# Patient Record
Sex: Female | Born: 1970 | Race: Black or African American | Hispanic: No | Marital: Single | Smoking: Former smoker
Health system: Southern US, Community
[De-identification: ages and names within clinical notes are randomized; demographics above are authoritative.]

## PROBLEM LIST (undated history)

## (undated) DIAGNOSIS — T7840XA Allergy, unspecified, initial encounter: Secondary | ICD-10-CM

## (undated) DIAGNOSIS — I82409 Acute embolism and thrombosis of unspecified deep veins of unspecified lower extremity: Secondary | ICD-10-CM

## (undated) DIAGNOSIS — I2510 Atherosclerotic heart disease of native coronary artery without angina pectoris: Secondary | ICD-10-CM

## (undated) DIAGNOSIS — D500 Iron deficiency anemia secondary to blood loss (chronic): Secondary | ICD-10-CM

## (undated) DIAGNOSIS — M1712 Unilateral primary osteoarthritis, left knee: Secondary | ICD-10-CM

## (undated) DIAGNOSIS — G4730 Sleep apnea, unspecified: Secondary | ICD-10-CM

## (undated) DIAGNOSIS — I10 Essential (primary) hypertension: Secondary | ICD-10-CM

## (undated) DIAGNOSIS — I509 Heart failure, unspecified: Secondary | ICD-10-CM

## (undated) DIAGNOSIS — D649 Anemia, unspecified: Secondary | ICD-10-CM

## (undated) DIAGNOSIS — K56609 Unspecified intestinal obstruction, unspecified as to partial versus complete obstruction: Secondary | ICD-10-CM

## (undated) DIAGNOSIS — D689 Coagulation defect, unspecified: Secondary | ICD-10-CM

## (undated) DIAGNOSIS — R479 Unspecified speech disturbances: Secondary | ICD-10-CM

## (undated) DIAGNOSIS — I639 Cerebral infarction, unspecified: Secondary | ICD-10-CM

## (undated) DIAGNOSIS — R7303 Prediabetes: Secondary | ICD-10-CM

## (undated) DIAGNOSIS — Z8719 Personal history of other diseases of the digestive system: Secondary | ICD-10-CM

## (undated) DIAGNOSIS — E119 Type 2 diabetes mellitus without complications: Secondary | ICD-10-CM

## (undated) DIAGNOSIS — F411 Generalized anxiety disorder: Secondary | ICD-10-CM

## (undated) DIAGNOSIS — L0390 Cellulitis, unspecified: Secondary | ICD-10-CM

## (undated) DIAGNOSIS — M1990 Unspecified osteoarthritis, unspecified site: Secondary | ICD-10-CM

## (undated) DIAGNOSIS — J449 Chronic obstructive pulmonary disease, unspecified: Secondary | ICD-10-CM

## (undated) HISTORY — DX: Prediabetes: R73.03

## (undated) HISTORY — DX: Chronic obstructive pulmonary disease, unspecified: J44.9

## (undated) HISTORY — DX: Generalized anxiety disorder: F41.1

## (undated) HISTORY — DX: Iron deficiency anemia secondary to blood loss (chronic): D50.0

## (undated) HISTORY — DX: Allergy, unspecified, initial encounter: T78.40XA

## (undated) HISTORY — DX: Morbid (severe) obesity due to excess calories: E66.01

## (undated) HISTORY — DX: Atherosclerotic heart disease of native coronary artery without angina pectoris: I25.10

## (undated) HISTORY — PX: EYE SURGERY: SHX253

## (undated) HISTORY — DX: Personal history of other diseases of the digestive system: Z87.19

## (undated) HISTORY — DX: Unspecified osteoarthritis, unspecified site: M19.90

## (undated) HISTORY — DX: Coagulation defect, unspecified: D68.9

## (undated) HISTORY — DX: Unspecified speech disturbances: R47.9

## (undated) HISTORY — DX: Cerebral infarction, unspecified: I63.9

## (undated) HISTORY — DX: Unilateral primary osteoarthritis, left knee: M17.12

## (undated) HISTORY — DX: Unspecified intestinal obstruction, unspecified as to partial versus complete obstruction: K56.609

## (undated) HISTORY — DX: Sleep apnea, unspecified: G47.30

---
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
## 1994-04-29 HISTORY — PX: OOPHORECTOMY: SHX86

## 1995-04-30 HISTORY — PX: OOPHORECTOMY: SHX86

## 2012-04-29 HISTORY — PX: PORTACATH PLACEMENT: SHX2246

## 2015-04-30 HISTORY — PX: IVC FILTER INSERTION: CATH118245

## 2017-09-17 DIAGNOSIS — I82409 Acute embolism and thrombosis of unspecified deep veins of unspecified lower extremity: Secondary | ICD-10-CM

## 2017-09-17 HISTORY — DX: Acute embolism and thrombosis of unspecified deep veins of unspecified lower extremity: I82.409

## 2018-11-16 DIAGNOSIS — Z8711 Personal history of peptic ulcer disease: Secondary | ICD-10-CM

## 2018-11-16 HISTORY — DX: Personal history of peptic ulcer disease: Z87.11

## 2019-11-17 DIAGNOSIS — I2510 Atherosclerotic heart disease of native coronary artery without angina pectoris: Secondary | ICD-10-CM | POA: Insufficient documentation

## 2020-01-20 ENCOUNTER — Encounter (HOSPITAL_COMMUNITY): Payer: Self-pay | Admitting: Emergency Medicine

## 2020-01-20 ENCOUNTER — Emergency Department (HOSPITAL_COMMUNITY)
Admission: EM | Admit: 2020-01-20 | Discharge: 2020-01-21 | Disposition: A | Discharge status: Home / Self Care | Payer: Medicaid - Out of State | Attending: Emergency Medicine | Admitting: Emergency Medicine

## 2020-01-20 ENCOUNTER — Other Ambulatory Visit: Payer: Self-pay

## 2020-01-20 DIAGNOSIS — I509 Heart failure, unspecified: Secondary | ICD-10-CM | POA: Insufficient documentation

## 2020-01-20 DIAGNOSIS — I2510 Atherosclerotic heart disease of native coronary artery without angina pectoris: Secondary | ICD-10-CM | POA: Insufficient documentation

## 2020-01-20 DIAGNOSIS — Z7901 Long term (current) use of anticoagulants: Secondary | ICD-10-CM | POA: Insufficient documentation

## 2020-01-20 DIAGNOSIS — L97929 Non-pressure chronic ulcer of unspecified part of left lower leg with unspecified severity: Secondary | ICD-10-CM | POA: Insufficient documentation

## 2020-01-20 DIAGNOSIS — I87312 Chronic venous hypertension (idiopathic) with ulcer of left lower extremity: Secondary | ICD-10-CM | POA: Insufficient documentation

## 2020-01-20 DIAGNOSIS — Z79899 Other long term (current) drug therapy: Secondary | ICD-10-CM | POA: Diagnosis not present

## 2020-01-20 DIAGNOSIS — I110 Hypertensive heart disease with heart failure: Secondary | ICD-10-CM | POA: Insufficient documentation

## 2020-01-20 DIAGNOSIS — L97822 Non-pressure chronic ulcer of other part of left lower leg with fat layer exposed: Secondary | ICD-10-CM

## 2020-01-20 DIAGNOSIS — L03116 Cellulitis of left lower limb: Secondary | ICD-10-CM | POA: Diagnosis present

## 2020-01-20 HISTORY — DX: Cellulitis, unspecified: L03.90

## 2020-01-20 HISTORY — DX: Atherosclerotic heart disease of native coronary artery without angina pectoris: I25.10

## 2020-01-20 HISTORY — DX: Heart failure, unspecified: I50.9

## 2020-01-20 HISTORY — DX: Essential (primary) hypertension: I10

## 2020-01-20 LAB — COMPREHENSIVE METABOLIC PANEL
ALT: 11 U/L (ref 0–44)
AST: 16 U/L (ref 15–41)
Albumin: 3.3 g/dL — ABNORMAL LOW (ref 3.5–5.0)
Alkaline Phosphatase: 74 U/L (ref 38–126)
Anion gap: 9 (ref 5–15)
BUN: 9 mg/dL (ref 6–20)
CO2: 28 mmol/L (ref 22–32)
Calcium: 9.3 mg/dL (ref 8.9–10.3)
Chloride: 103 mmol/L (ref 98–111)
Creatinine, Ser: 0.75 mg/dL (ref 0.44–1.00)
GFR calc Af Amer: >60 mL/min (ref >60)
GFR calc non Af Amer: >60 mL/min (ref >60)
Glucose, Bld: 98 mg/dL (ref 70–99)
Potassium: 3.6 mmol/L (ref 3.5–5.1)
Sodium: 140 mmol/L (ref 135–145)
Total Bilirubin: 1.3 mg/dL — ABNORMAL HIGH (ref 0.3–1.2)
Total Protein: 7.5 g/dL (ref 6.5–8.1)

## 2020-01-20 LAB — CBG MONITORING, ED: Glucose-Capillary: 84 mg/dL (ref 70–99)

## 2020-01-20 LAB — CBC WITH DIFFERENTIAL/PLATELET
Abs Immature Granulocytes: 0.02 10*3/uL (ref 0.00–0.07)
Basophils Absolute: 0 10*3/uL (ref 0.0–0.1)
Basophils Relative: 0 %
Eosinophils Absolute: 0.1 10*3/uL (ref 0.0–0.5)
Eosinophils Relative: 1 %
HCT: 35.2 % — ABNORMAL LOW (ref 36.0–46.0)
Hemoglobin: 10.1 g/dL — ABNORMAL LOW (ref 12.0–15.0)
Immature Granulocytes: 0 %
Lymphocytes Relative: 9 %
Lymphs Abs: 0.5 10*3/uL — ABNORMAL LOW (ref 0.7–4.0)
MCH: 26.2 pg (ref 26.0–34.0)
MCHC: 28.7 g/dL — ABNORMAL LOW (ref 30.0–36.0)
MCV: 91.4 fL (ref 80.0–100.0)
Monocytes Absolute: 0.4 10*3/uL (ref 0.1–1.0)
Monocytes Relative: 7 %
Neutro Abs: 4.7 10*3/uL (ref 1.7–7.7)
Neutrophils Relative %: 83 %
Platelets: 151 10*3/uL (ref 150–400)
RBC: 3.85 MIL/uL — ABNORMAL LOW (ref 3.87–5.11)
RDW: 15.7 % — ABNORMAL HIGH (ref 11.5–15.5)
WBC: 5.8 10*3/uL (ref 4.0–10.5)
nRBC: 0 % (ref 0.0–0.2)

## 2020-01-20 LAB — I-STAT BETA HCG BLOOD, ED (MC, WL, AP ONLY): I-stat hCG, quantitative: <5 m[IU]/mL (ref <5)

## 2020-01-20 LAB — PROTIME-INR
INR: 1 (ref 0.8–1.2)
Prothrombin Time: 12.9 seconds (ref 11.4–15.2)

## 2020-01-20 LAB — LACTIC ACID, PLASMA: Lactic Acid, Venous: 1 mmol/L (ref 0.5–1.9)

## 2020-01-20 NOTE — ED Notes (Signed)
Pt asked that her leg bandage be cleaned because it is wet. Non-adherent and stretch gauze used.

## 2020-01-20 NOTE — ED Triage Notes (Signed)
Pt reports cellulitis to L lower leg that has been worsening over the past 2 weeks.  Reports history of same and states she had been doing wound care at home with no improvement.    Denies fever and chills.

## 2020-01-20 NOTE — ED Notes (Signed)
Pt stated that she felt her blood sugar was low. Pt's CBG result checked. Pt's CBG result was 84. Informed Psychologist, sport and exercise. Pt asked for graham crackers. Pt given a pack of graham crackers, per [employer] manager.

## 2020-01-21 ENCOUNTER — Telehealth: Payer: Self-pay | Admitting: General Practice

## 2020-01-21 ENCOUNTER — Emergency Department (HOSPITAL_COMMUNITY): Payer: Medicaid - Out of State

## 2020-01-21 LAB — URINALYSIS, ROUTINE W REFLEX MICROSCOPIC
Bilirubin Urine: NEGATIVE
Glucose, UA: NEGATIVE mg/dL
Hgb urine dipstick: NEGATIVE
Ketones, ur: NEGATIVE mg/dL
Leukocytes,Ua: NEGATIVE
Nitrite: NEGATIVE
Protein, ur: NEGATIVE mg/dL
Specific Gravity, Urine: 1.015 (ref 1.005–1.030)
pH: 5.5 (ref 5.0–8.0)

## 2020-01-21 LAB — BRAIN NATRIURETIC PEPTIDE: B Natriuretic Peptide: 143.5 pg/mL — ABNORMAL HIGH (ref 0.0–100.0)

## 2020-01-21 MED ORDER — DOXYCYCLINE HYCLATE 100 MG PO CAPS: 100.0000 mg | ORAL_CAPSULE | Freq: Two times a day (BID) | ORAL | 0 refills | Status: DC

## 2020-01-21 MED ORDER — FUROSEMIDE 10 MG/ML IJ SOLN
80.0000 mg | Freq: Once | INTRAMUSCULAR | Status: AC
  Administered 2020-01-21: 80 mg via INTRAVENOUS
  Filled 2020-01-21: qty 8

## 2020-01-21 MED ORDER — OXYCODONE-ACETAMINOPHEN 5-325 MG PO TABS
1.0000 | ORAL_TABLET | Freq: Once | ORAL | Status: AC
  Administered 2020-01-21: 1 via ORAL
  Filled 2020-01-21: qty 1

## 2020-01-21 MED ORDER — HYDROCODONE-ACETAMINOPHEN 5-325 MG PO TABS: 1.0000 | ORAL_TABLET | ORAL | 0 refills | Status: DC | PRN

## 2020-01-21 NOTE — Telephone Encounter (Signed)
I called back the Pt and LVM inform her that she need to be an establish Pt for the clinic before she can apply for the financial programs and Rx the [facility] offer to they Pt

## 2020-01-21 NOTE — Telephone Encounter (Signed)
Copied from [place] [id number]. Topic: General - Other >> Jan 21, 2020  8:08 AM Celene Kras wrote: Reason for CRM: Pt called and is requesting to speak with the financial advisor regarding getting help with medications. Please advise .

## 2020-01-21 NOTE — ED Notes (Signed)
Pt given d/c instructions, verbalized understanding of the same.

## 2020-01-21 NOTE — ED Provider Notes (Signed)
[facility] EMERGENCY DEPARTMENT Provider Note   CSN: [id number] Arrival date & time: 01/20/20  1849     History Chief Complaint  Patient presents with  . Cellulitis    [name] is a 49 y.o. female.  Patient presents to the emergency department with concerns over cellulitis of the left lower leg.  Patient reports that she has a nonhealing ulcerated area on the leg.  She has had periodic infections in the past.  Patient reports that she has noticed increased swelling of her legs over the last couple of weeks and in the last 2 days the area has started draining.  She reports increased pain and difficulty walking because of the swelling and pain.  She has not had any fever.        Past Medical History:  Diagnosis Date  . Anemia   . CAD (coronary artery disease)   . Cellulitis   . CHF (congestive heart failure) ([place])   . DVT (deep venous thrombosis) ([place])   . Hypertension     There are no problems to display for this patient.   History reviewed. No pertinent surgical history.   OB History   No obstetric history on file.     No family history on file.  Social History   Tobacco Use  . Smoking status: Never Smoker  . Smokeless tobacco: Never Used  Substance Use Topics  . Alcohol use: Not Currently  . Drug use: Not Currently    Home Medications Prior to Admission medications   Medication Sig Start Date End Date Taking? Authorizing Provider  amLODipine (NORVASC) 5 MG tablet Take 2.5 mg by mouth daily. 12/15/19  Yes [provider]  apixaban (ELIQUIS) 5 MG TABS tablet Take 5 mg by mouth 2 (two) times daily.   Yes [provider]  atorvastatin (LIPITOR) 40 MG tablet Take 40 mg by mouth daily. 12/15/19  Yes [provider]  busPIRone (BUSPAR) 15 MG tablet Take 15 mg by mouth daily. 12/15/19  Yes [provider]  carvedilol (COREG) 6.25 MG tablet Take 6.25 mg by mouth 2 (two) times daily with a meal.   Yes  [provider]  cholecalciferol (VITAMIN D3) 25 MCG (1000 UNIT) tablet Take 1,000 Units by mouth daily.   Yes [provider]  fluticasone (FLOVENT HFA) 110 MCG/ACT inhaler Inhale 2 puffs into the lungs daily. 12/15/19  Yes [provider]  furosemide (LASIX) 80 MG tablet Take 80 mg by mouth 2 (two) times daily.   Yes [provider]  omeprazole (PRILOSEC) 20 MG capsule Take 20 mg by mouth daily. 12/15/19  Yes [provider]  potassium chloride SA (KLOR-CON) 20 MEQ tablet Take 60 mEq by mouth in the morning, at noon, and at bedtime.   Yes [provider]  spironolactone (ALDACTONE) 25 MG tablet Take 25 mg by mouth daily. 12/15/19  Yes [provider]  vitamin B-12 (CYANOCOBALAMIN) 1000 MCG tablet Take 1,000 mcg by mouth daily.   Yes [provider]  doxycycline (VIBRAMYCIN) 100 MG capsule Take 1 capsule (100 mg total) by mouth 2 (two) times daily. 01/21/20   Orpah Greek, MD  HYDROcodone-acetaminophen (NORCO/VICODIN) 5-325 MG tablet Take 1 tablet by mouth every 4 (four) hours as needed for moderate pain. 01/21/20   Orpah Greek, MD    Allergies    Ace inhibitors, Hydromorphone, Vancomycin, and Iron  Review of Systems   Review of Systems  Cardiovascular: Positive for leg swelling.  Skin: Positive for wound.  All other systems reviewed and are negative.   Physical Exam Updated Vital Signs BP 106/73 (BP Location: Right Arm)   Pulse 77   Temp 98.7 F (37.1 C) (Oral)   Resp (!) 22   Ht 5\' 6"  (1.676 m)   Wt (!) 145.2 kg   SpO2 97%   BMI 51.65 kg/m   Physical Exam Vitals and nursing note reviewed.  Constitutional:      General: She is not in acute distress.    Appearance: Normal appearance. She is well-developed.  HENT:     Head: Normocephalic and atraumatic.     Right Ear: Hearing normal.     Left Ear: Hearing normal.     Nose: Nose normal.  Eyes:     Conjunctiva/sclera: Conjunctivae  normal.     Pupils: Pupils are equal, round, and reactive to light.  Cardiovascular:     Rate and Rhythm: Regular rhythm.     Heart sounds: S1 normal and S2 normal. No murmur heard.  No friction rub. No gallop.   Pulmonary:     Effort: Pulmonary effort is normal. No respiratory distress.     Breath sounds: Normal breath sounds.  Chest:     Chest wall: No tenderness.  Abdominal:     General: Bowel sounds are normal.     Palpations: Abdomen is soft.     Tenderness: There is no abdominal tenderness. There is no guarding or rebound. Negative signs include Murphy's sign and McBurney's sign.     Hernia: No hernia is present.  Musculoskeletal:        General: Normal range of motion.     Cervical back: Normal range of motion and neck supple.     Right lower leg: Edema present.     Left lower leg: Edema present.  Skin:    General: Skin is warm and dry.     Findings: No rash.     Comments: Ulcerated area left lower leg with normal granulation tissue present.  Region around the wound is warm to the touch but no perceivable erythema.  No induration or fluctuance.  Serosanguineous drainage coming from the wound.  Surrounding edema present.  Neurological:     Mental Status: She is alert and oriented to person, place, and time.     GCS: GCS eye subscore is 4. GCS verbal subscore is 5. GCS motor subscore is 6.     Cranial Nerves: No cranial nerve deficit.     Sensory: No sensory deficit.     Coordination: Coordination normal.  Psychiatric:        Speech: Speech normal.        Behavior: Behavior normal.        Thought Content: Thought content normal.     ED Results / Procedures / Treatments   Labs (all labs ordered are listed, but only abnormal results are displayed) Labs Reviewed  COMPREHENSIVE METABOLIC PANEL - Abnormal; Notable for the following components:      Result Value   Albumin 3.3 (*)    Total Bilirubin 1.3 (*)    All other components within normal limits  CBC WITH  DIFFERENTIAL/PLATELET - Abnormal; Notable for the following components:   RBC 3.85 (*)    Hemoglobin 10.1 (*)    HCT 35.2 (*)    MCHC 28.7 (*)    RDW 15.7 (*)    Lymphs Abs 0.5 (*)    All other components within normal limits  BRAIN NATRIURETIC PEPTIDE -  Abnormal; Notable for the following components:   B Natriuretic Peptide 143.5 (*)    All other components within normal limits  CULTURE, BLOOD (ROUTINE X 2)  CULTURE, BLOOD (ROUTINE X 2)  LACTIC ACID, PLASMA  PROTIME-INR  LACTIC ACID, PLASMA  URINALYSIS, ROUTINE W REFLEX MICROSCOPIC  I-STAT BETA HCG BLOOD, ED ([facility], [facility], [facility] ONLY)  CBG MONITORING, ED    EKG None  Radiology DG Tibia/Fibula Left  Result Date: 01/21/2020 CLINICAL DATA:  Left leg swelling EXAM: LEFT TIBIA AND FIBULA - 2 VIEW COMPARISON:  None. FINDINGS: Degenerative changes of the left knee joint are noted in all 3 joint compartments. No acute fracture or dislocation is noted. Generalized soft tissue swelling is seen. Dystrophic soft tissue calcifications are noted. No acute bony abnormality is seen. IMPRESSION: No acute bony abnormality noted. Electronically Signed   By: Inez Catalina M.D.   On: 01/21/2020 02:50   DG Chest Port 1 View  Result Date: 01/21/2020 CLINICAL DATA:  Peripheral leg swelling EXAM: PORTABLE CHEST 1 VIEW COMPARISON:  None. FINDINGS: Cardiac shadow is at the upper limits of normal in size. Right-sided chest wall port is seen in satisfactory position. The lungs are clear. No bony abnormality is noted. IMPRESSION: No acute abnormality seen. Electronically Signed   By: Inez Catalina M.D.   On: 01/21/2020 02:49    Procedures Procedures (including critical care time)  Medications Ordered in ED Medications  furosemide (LASIX) injection 80 mg (80 mg Intravenous Given 01/21/20 0416)  oxyCODONE-acetaminophen (PERCOCET/ROXICET) 5-325 MG per tablet 1 tablet (1 tablet Oral Given 01/21/20 0409)    ED Course  I have reviewed the triage vital signs and the  nursing notes.  Pertinent labs & imaging results that were available during my care of the patient were reviewed by me and considered in my medical decision making (see chart for details).    MDM Rules/Calculators/A&P                          Patient with nonhealing ulcer of the left shin.  This is consistent with a venous stasis ulcer.  Patient reports that it usually gets worse in the summertime when her legs swell more.  She does have significant edema of her lower extremities without any evidence of congestive heart failure.  Patient is weeping interstitial fluid but there does not appear to be any significant sign of infection.  Work-up is entirely unremarkable.  She is afebrile.  Lactic acid is 1.0.  White blood cell count is 5.8.  X-ray does not show any bone destruction or gas-forming infections.  Edema is symmetric bilaterally.  She is on Eliquis already, doubt DVT.  Patient received IV Lasix here in the ER for diuresis, will wrap the leg.  Local wound care and will empirically start doxycycline but she does not require hospitalization at this time.  Will provide analgesia.  Final Clinical Impression(s) / ED Diagnoses Final diagnoses:  Venous stasis ulcer of other part of left lower leg with fat layer exposed without varicose veins ([place])    Rx / DC Orders ED Discharge Orders         Ordered    doxycycline (VIBRAMYCIN) 100 MG capsule  2 times daily        01/21/20 0506    HYDROcodone-acetaminophen (NORCO/VICODIN) 5-325 MG tablet  Every 4 hours PRN        01/21/20 0506           Roisin Mones,  Gwenyth Allegra, MD 01/21/20 [phone]

## 2020-01-25 LAB — CULTURE, BLOOD (ROUTINE X 2): Culture: NO GROWTH

## 2020-02-08 ENCOUNTER — Encounter (HOSPITAL_COMMUNITY): Payer: Self-pay

## 2020-02-08 ENCOUNTER — Emergency Department (HOSPITAL_COMMUNITY): Payer: Medicaid - Out of State

## 2020-02-08 ENCOUNTER — Emergency Department (HOSPITAL_COMMUNITY)
Admission: EM | Admit: 2020-02-08 | Discharge: 2020-02-08 | Disposition: A | Discharge status: Home / Self Care | Payer: Medicaid - Out of State | Attending: Emergency Medicine | Admitting: Emergency Medicine

## 2020-02-08 DIAGNOSIS — I509 Heart failure, unspecified: Secondary | ICD-10-CM | POA: Diagnosis not present

## 2020-02-08 DIAGNOSIS — I110 Hypertensive heart disease with heart failure: Secondary | ICD-10-CM | POA: Diagnosis not present

## 2020-02-08 DIAGNOSIS — Z7901 Long term (current) use of anticoagulants: Secondary | ICD-10-CM | POA: Diagnosis not present

## 2020-02-08 DIAGNOSIS — I2510 Atherosclerotic heart disease of native coronary artery without angina pectoris: Secondary | ICD-10-CM | POA: Diagnosis not present

## 2020-02-08 DIAGNOSIS — S81802A Unspecified open wound, left lower leg, initial encounter: Secondary | ICD-10-CM

## 2020-02-08 DIAGNOSIS — L97929 Non-pressure chronic ulcer of unspecified part of left lower leg with unspecified severity: Secondary | ICD-10-CM | POA: Insufficient documentation

## 2020-02-08 DIAGNOSIS — Z79899 Other long term (current) drug therapy: Secondary | ICD-10-CM | POA: Diagnosis not present

## 2020-02-08 DIAGNOSIS — Z86718 Personal history of other venous thrombosis and embolism: Secondary | ICD-10-CM | POA: Insufficient documentation

## 2020-02-08 LAB — CBC WITH DIFFERENTIAL/PLATELET
Abs Immature Granulocytes: 0.01 10*3/uL (ref 0.00–0.07)
Basophils Absolute: 0 10*3/uL (ref 0.0–0.1)
Basophils Relative: 0 %
Eosinophils Absolute: 0.1 10*3/uL (ref 0.0–0.5)
Eosinophils Relative: 1 %
HCT: 35.3 % — ABNORMAL LOW (ref 36.0–46.0)
Hemoglobin: 10.1 g/dL — ABNORMAL LOW (ref 12.0–15.0)
Immature Granulocytes: 0 %
Lymphocytes Relative: 14 %
Lymphs Abs: 0.7 10*3/uL (ref 0.7–4.0)
MCH: 26.6 pg (ref 26.0–34.0)
MCHC: 28.6 g/dL — ABNORMAL LOW (ref 30.0–36.0)
MCV: 92.9 fL (ref 80.0–100.0)
Monocytes Absolute: 0.5 10*3/uL (ref 0.1–1.0)
Monocytes Relative: 10 %
Neutro Abs: 3.8 10*3/uL (ref 1.7–7.7)
Neutrophils Relative %: 75 %
Platelets: 148 10*3/uL — ABNORMAL LOW (ref 150–400)
RBC: 3.8 MIL/uL — ABNORMAL LOW (ref 3.87–5.11)
RDW: 16.2 % — ABNORMAL HIGH (ref 11.5–15.5)
WBC: 5 10*3/uL (ref 4.0–10.5)
nRBC: 0 % (ref 0.0–0.2)

## 2020-02-08 MED ORDER — HYDROCODONE-ACETAMINOPHEN 5-325 MG PO TABS: 1.0000 | ORAL_TABLET | Freq: Four times a day (QID) | ORAL | 0 refills | Status: DC | PRN

## 2020-02-08 MED ORDER — FENTANYL CITRATE (PF) 100 MCG/2ML IJ SOLN
50.0000 ug | Freq: Once | INTRAMUSCULAR | Status: AC
  Administered 2020-02-08: 50 ug via INTRAMUSCULAR
  Filled 2020-02-08: qty 2

## 2020-02-08 NOTE — ED Triage Notes (Signed)
Pt presents with c/o left lower leg swelling around a leg ulcer that she is being treated for. Pt reports pain when she puts pressure on that leg.

## 2020-02-08 NOTE — Discharge Instructions (Addendum)
As discussed, your evaluation today has been largely reassuring.  But, it is important that you monitor your condition carefully, and do not hesitate to return to the ED if you develop new, or concerning changes in your condition.  It is very important that you establish care with our wound care center and follow-up there as directed.

## 2020-02-08 NOTE — ED Provider Notes (Signed)
[facility] DEPT Provider Note   CSN: [id number] Arrival date & time: 02/08/20  1449     History Chief Complaint  Patient presents with  . Leg wound    [name] is a 49 y.o. female.  HPI    Patient presents concern of pain, warmth around the ulcer on her left medial distal leg. She notes multiple medical issues, including a recurrent ulcer in this area. This current induration has been present for 2 months, has been painful, persistently, in spite of home dressing changes. There is reportedly no warmth, erythema. There is no new nausea, vomiting, fever, pain anywhere else.  Symptoms are worse with ambulation, activity, pressure application, not improved with narcotic use. Past Medical History:  Diagnosis Date  . Anemia   . CAD (coronary artery disease)   . Cellulitis   . CHF (congestive heart failure) ([place])   . DVT (deep venous thrombosis) ([place])   . Hypertension     There are no problems to display for this patient.   History reviewed. No pertinent surgical history.   OB History   No obstetric history on file.     History reviewed. No pertinent family history.  Social History   Tobacco Use  . Smoking status: Never Smoker  . Smokeless tobacco: Never Used  Substance Use Topics  . Alcohol use: Not Currently  . Drug use: Not Currently    Home Medications Prior to Admission medications   Medication Sig Start Date End Date Taking? Authorizing Provider  amLODipine (NORVASC) 5 MG tablet Take 2.5 mg by mouth daily. 12/15/19   [provider]  apixaban (ELIQUIS) 5 MG TABS tablet Take 5 mg by mouth 2 (two) times daily.    [provider]  atorvastatin (LIPITOR) 40 MG tablet Take 40 mg by mouth daily. 12/15/19   [provider]  busPIRone (BUSPAR) 15 MG tablet Take 15 mg by mouth daily. 12/15/19   [provider]  carvedilol (COREG) 6.25 MG tablet Take 6.25 mg by mouth 2 (two) times daily  with a meal.    [provider]  cholecalciferol (VITAMIN D3) 25 MCG (1000 UNIT) tablet Take 1,000 Units by mouth daily.    [provider]  doxycycline (VIBRAMYCIN) 100 MG capsule Take 1 capsule (100 mg total) by mouth 2 (two) times daily. 01/21/20   Orpah Greek, MD  fluticasone (FLOVENT HFA) 110 MCG/ACT inhaler Inhale 2 puffs into the lungs daily. 12/15/19   [provider]  furosemide (LASIX) 80 MG tablet Take 80 mg by mouth 2 (two) times daily.    [provider]  HYDROcodone-acetaminophen (NORCO/VICODIN) 5-325 MG tablet Take 1 tablet by mouth every 4 (four) hours as needed for moderate pain. 01/21/20   Orpah Greek, MD  omeprazole (PRILOSEC) 20 MG capsule Take 20 mg by mouth daily. 12/15/19   [provider]  potassium chloride SA (KLOR-CON) 20 MEQ tablet Take 60 mEq by mouth in the morning, at noon, and at bedtime.    [provider]  spironolactone (ALDACTONE) 25 MG tablet Take 25 mg by mouth daily. 12/15/19   [provider]  vitamin B-12 (CYANOCOBALAMIN) 1000 MCG tablet Take 1,000 mcg by mouth daily.    [provider]    Allergies    Ace inhibitors, Hydromorphone, Vancomycin, and Iron  Review of Systems   Review of Systems  Constitutional:       Per HPI, otherwise negative  HENT:  Per HPI, otherwise negative  Respiratory:       Per HPI, otherwise negative  Cardiovascular:       Per HPI, otherwise negative  Gastrointestinal: Negative for vomiting.  Endocrine:       Negative aside from HPI  Genitourinary:       Neg aside from HPI   Musculoskeletal:       Per HPI, otherwise negative  Skin: Negative.   Neurological: Negative for syncope.    Physical Exam Updated Vital Signs BP (!) 164/119   Pulse 80   Temp 98 F (36.7 C) (Oral)   Resp 16   SpO2 99%   Physical Exam Vitals and nursing note reviewed.  Constitutional:      General: She is not in acute distress.     Appearance: She is well-developed. She is obese. She is not toxic-appearing.  HENT:     Head: Normocephalic and atraumatic.  Eyes:     Conjunctiva/sclera: Conjunctivae normal.  Cardiovascular:     Rate and Rhythm: Normal rate and regular rhythm.     Pulses: Normal pulses.  Pulmonary:     Effort: Pulmonary effort is normal. No respiratory distress.     Breath sounds: No stridor.  Abdominal:     General: There is no distension.  Skin:    General: Skin is warm and dry.       Neurological:     Mental Status: She is alert and oriented to person, place, and time.     Cranial Nerves: No cranial nerve deficit.     ED Results / Procedures / Treatments   Labs (all labs ordered are listed, but only abnormal results are displayed) Labs Reviewed  CBC WITH DIFFERENTIAL/PLATELET    EKG None  Radiology No results found.  Procedures Procedures (including critical care time)  Medications Ordered in ED Medications  fentaNYL (SUBLIMAZE) injection 50 mcg (50 mcg Intramuscular Given 02/08/20 1854)    ED Course  I have reviewed the triage vital signs and the nursing notes.  Pertinent labs & imaging results that were available during my care of the patient were reviewed by me and considered in my medical decision making (see chart for details).  On repeat exam patient in no distress.  I reviewed labs, x-ray, no evidence for osteo-, bacteremia, sepsis. Though the patient requests admission for therapy of her leg wound, no negation for that, patient discharged in stable condition with outpatient follow-up at our wound clinic, ongoing analgesics. Final Clinical Impression(s) / ED Diagnoses Final diagnoses:  Open wound of left lower extremity, initial encounter     Carmin Muskrat, MD 02/08/20 2035

## 2020-03-10 ENCOUNTER — Other Ambulatory Visit: Payer: Self-pay

## 2020-03-10 ENCOUNTER — Encounter (HOSPITAL_BASED_OUTPATIENT_CLINIC_OR_DEPARTMENT_OTHER): Payer: PRIVATE HEALTH INSURANCE | Attending: Internal Medicine | Admitting: Internal Medicine

## 2020-03-10 DIAGNOSIS — I87332 Chronic venous hypertension (idiopathic) with ulcer and inflammation of left lower extremity: Secondary | ICD-10-CM | POA: Insufficient documentation

## 2020-03-10 DIAGNOSIS — Z86718 Personal history of other venous thrombosis and embolism: Secondary | ICD-10-CM | POA: Diagnosis not present

## 2020-03-10 DIAGNOSIS — I10 Essential (primary) hypertension: Secondary | ICD-10-CM | POA: Diagnosis not present

## 2020-03-10 DIAGNOSIS — I252 Old myocardial infarction: Secondary | ICD-10-CM | POA: Diagnosis not present

## 2020-03-10 DIAGNOSIS — Z7901 Long term (current) use of anticoagulants: Secondary | ICD-10-CM | POA: Diagnosis not present

## 2020-03-10 DIAGNOSIS — L97822 Non-pressure chronic ulcer of other part of left lower leg with fat layer exposed: Secondary | ICD-10-CM | POA: Insufficient documentation

## 2020-03-10 DIAGNOSIS — Z79899 Other long term (current) drug therapy: Secondary | ICD-10-CM | POA: Insufficient documentation

## 2020-03-10 DIAGNOSIS — I890 Lymphedema, not elsewhere classified: Secondary | ICD-10-CM | POA: Insufficient documentation

## 2020-03-10 NOTE — Progress Notes (Signed)
[name], [name] ([id number]) Visit Report for 03/10/2020 Abuse/Suicide Risk Screen Details Patient Name: Date of Service: [name], [name] 03/10/2020 1:15 PM Medical Record Number: [id number] Patient Account Number: [id number] Date of Birth/Sex: Treating RN: 11-Dec-1970 (49 y.o. Orvan Falconer Primary Care Trezure Cronk: PA [place], [place] Other Clinician: Referring Kandice Schmelter: Treating Tatijana Bierly/Extender: Cheree Ditto in Treatment: 0 Abuse/Suicide Risk Screen Items Answer ABUSE RISK SCREEN: Has anyone close to you tried to hurt or harm you recentlyo No Do you feel uncomfortable with anyone in your familyo No Has anyone forced you do things that you didnt want to doo No Electronic Signature(s) Signed: 03/10/2020 5:42:44 PM By: Carlene Coria RN Entered By: Carlene Coria on 03/10/2020 14:01:23 -------------------------------------------------------------------------------- Activities of Daily Living Details Patient Name: Date of Service: [name], [name] 03/10/2020 1:15 PM Medical Record Number: [id number] Patient Account Number: [id number] Date of Birth/Sex: Treating RN: November 14, 1970 (49 y.o. Orvan Falconer Primary Care Herman Mell: PA [place], [place] Other Clinician: Referring David Rodriquez: Treating Jaci Desanto/Extender: Cheree Ditto in Treatment: 0 Activities of Daily Living Items Answer Activities of Daily Living (Please select one for each item) Drive Automobile Not Able T Medications ake Completely Able Use T elephone Completely Able Care for Appearance Completely Able Use T oilet Completely Able Bath / Shower Completely Able Dress Self Completely Able Feed Self Completely Able Walk Completely Able Get In / Out Bed Completely Able Housework Need Assistance Prepare Meals Completely [place] for Self Need Assistance Electronic Signature(s) Signed: 03/10/2020 5:42:44 PM By: Carlene Coria RN Entered By: Carlene Coria on 03/10/2020  14:02:13 -------------------------------------------------------------------------------- Education Screening Details Patient Name: Date of Service: [name], [name] 03/10/2020 1:15 PM Medical Record Number: [id number] Patient Account Number: [id number] Date of Birth/Sex: Treating RN: 10-Jan-1971 (49 y.o. Orvan Falconer Primary Care Brina Umeda: PA [place], [place] Other Clinician: Referring Jaimie Redditt: Treating Dereona Kolodny/Extender: Cheree Ditto in Treatment: 0 Learning Preferences/Education Level/Primary Language Learning Preference: Explanation Highest Education Level: High School Preferred Language: English Cognitive Barrier Language Barrier: No Translator Needed: No Memory Deficit: No Emotional Barrier: No Cultural/Religious Beliefs Affecting Medical Care: No Physical Barrier Impaired Vision: Yes Glasses Impaired Hearing: No Decreased Hand dexterity: No Knowledge/Comprehension Knowledge Level: Medium Comprehension Level: Medium Ability to understand written instructions: Medium Ability to understand verbal instructions: Medium Motivation Anxiety Level: Anxious Cooperation: Cooperative Education Importance: Acknowledges Need Interest in Health Problems: Asks Questions Perception: Coherent Willingness to Engage in Self-Management High Activities: Readiness to Engage in Self-Management High Activities: Electronic Signature(s) Signed: 03/10/2020 5:42:44 PM By: Carlene Coria RN Entered By: Carlene Coria on 03/10/2020 14:03:05 -------------------------------------------------------------------------------- Fall Risk Assessment Details Patient Name: Date of Service: [name], [name] 03/10/2020 1:15 PM Medical Record Number: [id number] Patient Account Number: [id number] Date of Birth/Sex: Treating RN: Feb 21, 1971 (49 y.o. Orvan Falconer Primary Care Michale Emmerich: PA [place], [place] Other Clinician: Referring Quinne Pires: Treating Kizzy Olafson/Extender: Cheree Ditto in  Treatment: 0 Fall Risk Assessment Items Have you had 2 or more falls in the last 12 monthso 0 No Have you had any fall that resulted in injury in the last 12 monthso 0 No FALLS RISK SCREEN History of falling - immediate or within 3 months 0 No Secondary diagnosis (Do you have 2 or more medical diagnoseso) 0 No Ambulatory aid None/bed rest/wheelchair/nurse 0 No Crutches/cane/walker 0 No Furniture 0 No Intravenous therapy Access/Saline/Heparin Lock 0 No Gait/Transferring Normal/ bed rest/ wheelchair 0 No Weak (short steps with or without shuffle, stooped but able to lift head  while walking, may seek 0 No support from furniture) Impaired (short steps with shuffle, may have difficulty arising from chair, head down, impaired 0 No balance) Mental Status Oriented to own ability 0 No Electronic Signature(s) Signed: 03/10/2020 5:42:44 PM By: Carlene Coria RN Entered By: Carlene Coria on 03/10/2020 14:03:13 -------------------------------------------------------------------------------- Foot Assessment Details Patient Name: Date of Service: [name], [name] 03/10/2020 1:15 PM Medical Record Number: [id number] Patient Account Number: [id number] Date of Birth/Sex: Treating RN: [birth year] (49 y.o. Orvan Falconer Primary Care [name]: PA [place], [place] Other Clinician: Referring Benaiah Behan: Treating Siddhartha Hoback/Extender: Cheree Ditto in Treatment: 0 Foot Assessment Items Site Locations + = Sensation present, - = Sensation absent, C = Callus, U = Ulcer R = Redness, W = Warmth, M = Maceration, PU = Pre-ulcerative lesion F = Fissure, S = Swelling, D = Dryness Assessment Right: Left: Other Deformity: No No Prior Foot Ulcer: No No Prior Amputation: No No Charcot Joint: No No Ambulatory Status: Ambulatory Without Help Gait: Steady Electronic Signature(s) Signed: 03/10/2020 5:42:44 PM By: Carlene Coria RN Entered By: Carlene Coria on 03/10/2020  14:06:54 -------------------------------------------------------------------------------- Nutrition Risk Screening Details Patient Name: Date of Service: [name], [name] 03/10/2020 1:15 PM Medical Record Number: [id number] Patient Account Number: [id number] Date of Birth/Sex: Treating RN: 07/22/1970 (49 y.o. Orvan Falconer Primary Care Tynetta Bachmann: PA [place], [place] Other Clinician: Referring Rondrick Barreira: Treating Rael Tilly/Extender: Cheree Ditto in Treatment: 0 Height (in): 66 Weight (lbs): 327 Body Mass Index (BMI): 52.8 Nutrition Risk Screening Items Score Screening NUTRITION RISK SCREEN: I have an illness or condition that made me change the kind and/or amount of food I eat 0 No I eat fewer than two meals per day 0 No I eat few fruits and vegetables, or milk products 0 No I have three or more drinks of beer, liquor or wine almost every day 0 No I have tooth or mouth problems that make it hard for me to eat 0 No I don't always have enough money to buy the food I need 0 No I eat alone most of the time 0 No I take three or more different prescribed or over-the-counter drugs a day 1 Yes Without wanting to, I have lost or gained 10 pounds in the last six months 0 No I am not always physically able to shop, cook and/or feed myself 0 No Nutrition Protocols Good Risk Protocol 0 No interventions needed Moderate Risk Protocol High Risk Proctocol Risk Level: Good Risk Score: 1 Electronic Signature(s) Signed: 03/10/2020 5:42:44 PM By: Carlene Coria RN Entered By: Carlene Coria on 03/10/2020 14:03:21

## 2020-03-13 NOTE — Progress Notes (Signed)
[name], [name] ([id number]) Visit Report for 03/10/2020 Chief Complaint Document Details Patient Name: Date of Service: Megan, [name] 03/10/2020 1:15 PM Medical Record Number: [id number] Patient Account Number: [id number] Date of Birth/Sex: Treating RN: July 06, 1970 (49 y.o. [name] Primary Care Provider: PA Haig Prophet, [place] Other Clinician: Referring Provider: Treating Provider/Extender: Cheree Ditto in Treatment: 0 Information Obtained from: Patient Chief Complaint 02/08/2020; patient is here for review of a wound on the left medial lower leg Electronic Signature(s) Signed: 03/13/2020 9:07:52 AM By: Linton Ham MD Entered By: Linton Ham on 03/10/2020 14:44:51 -------------------------------------------------------------------------------- HPI Details Patient Name: Date of Service: [name], [name] 03/10/2020 1:15 PM Medical Record Number: [id number] Patient Account Number: [id number] Date of Birth/Sex: Treating RN: 1970-08-17 (49 y.o. [name] Primary Care Provider: PA Haig Prophet, [place] Other Clinician: Referring Provider: Treating Provider/Extender: Cheree Ditto in Treatment: 0 History of Present Illness HPI Description: ADMISSION 03/10/2020 This is a 49 year old woman who was recently moved to this area from [place] to help look after her mother who is ill. She has a several month history of a wound on the left medial lower leg. She was in the emergency room on 9/23 and 10/12. On both occasions she had x-rays that were negative and she was given doxycycline. On one occasion she had an Haematologist wrap. T the diameter of the wound on 10/12 was 6 cm I do not think a stat large today however her o compression stockings no longer fit because of swelling. Furthermore she tells me she has had external compression pumps but they are not working either. She comes with her last note from her primary doctor in [place] from June. She has a large  list of prior diagnoses including iron deficiency anemia, anxiety, asthma, hypertension, coronary artery disease with an MI in 2007, cellulitis in 2019, congestive heart failure, history of a DVT on Eliquis with the advice to stay on this "lifelong", gastric ulcer, chronic blood loss history of an iron infusion given in 2019 with a complete GI work-up negative, osteoarthritis of the left knee, cyst history of a small bowel obstruction, upper GI bleed and prediabetes. She comes in with a long list of medications however the only medications she is taking currently are Eliquis 5 mg twice daily and amlodipine 10 mg daily. She is supposed to be taking atorvastatin, Lasix 80 mg a day, isosorbide mononitrate 30 mg a day, Klor-Con 20 mEq a day but she does not appear to be taking any of these. She tells me that she has had difficulty getting a primary doctor ABI in our clinic was 1.17 on the left Electronic Signature(s) Signed: 03/13/2020 9:07:52 AM By: Linton Ham MD Entered By: Linton Ham on 03/10/2020 14:56:53 -------------------------------------------------------------------------------- Physical Exam Details Patient Name: Date of Service: [name], [name] 03/10/2020 1:15 PM Medical Record Number: [id number] Patient Account Number: [id number] Date of Birth/Sex: Treating RN: 01-Mar-1971 (49 y.o. [name] Primary Care Provider: PA Haig Prophet, [place] Other Clinician: Referring Provider: Treating Provider/Extender: Cheree Ditto in Treatment: 0 Constitutional Patient is hypertensive. However it was repeated as in the normal range. Pulse regular and within target range for patient.[name] Respirations regular, non-labored and within target range.. Temperature is normal and within the target range for the patient.[name] Appears in no distress. Respiratory work of breathing is normal. Bilateral breath sounds are clear and equal in all lobes with no wheezes, rales or  rhonchi.. Cardiovascular Heart rhythm and rate regular, without murmur  or gallop. JVP not elevated. Needle pulses palpable. Severe bilateral lymphedema. Integumentary (Hair, Skin) The patient's wound is in the left medial lower extremity. Surrounding hemosiderin deposition and tightly fibrosed skin. Notes Wound exam; the wound itself looks clean healthy looking granulation tissue. By description of the dimensions recorded in the ER last month the wound is gotten smaller. Electronic Signature(s) Signed: 03/13/2020 9:07:52 AM By: Linton Ham MD Entered By: Linton Ham on 03/10/2020 14:58:30 -------------------------------------------------------------------------------- Physician Orders Details Patient Name: Date of Service: [name], [name] 03/10/2020 1:15 PM Medical Record Number: [id number] Patient Account Number: [id number] Date of Birth/Sex: Treating RN: 1971/04/15 (49 y.o. [name] Primary Care Provider: PA Haig Prophet, [place] Other Clinician: Referring Provider: Treating Provider/Extender: Cheree Ditto in Treatment: 0 Verbal / Phone Orders: No Diagnosis Coding Follow-up Appointments Return appointment in 3 weeks. - Fri 12/3 MD visit Nurse Visit: - Ludwig Clarks 11/19, Tues 11/23 Dressing Change Frequency Wound #1 Left,Medial Lower Leg Do not change entire dressing for one week. Skin Barriers/Peri-Wound Care Moisturizing lotion TCA Cream or Ointment - mix with lotion to lower leg Wound Cleansing May shower with protection. Primary Wound Dressing Wound #1 Left,Medial Lower Leg Calcium Alginate with Silver Secondary Dressing Wound #1 Left,Medial Lower Leg ABD pad Edema Control 4 layer compression: Left lower extremity Avoid standing for long periods of time Elevate legs to the level of the heart or above for 30 minutes daily and/or when sitting, a frequency of: - throughout the day Exercise regularly Support Garment 20-30 mm/Hg pressure to: - to right leg  daily Segmental Compressive Device. - lymphadema pumps 60 minutes 2 times per day, call manufacturer to fix pumps Electronic Signature(s) Signed: 03/10/2020 5:58:22 PM By: Baruch Gouty RN, BSN Signed: 03/13/2020 9:07:52 AM By: Linton Ham MD Entered By: Baruch Gouty on 03/10/2020 14:42:14 -------------------------------------------------------------------------------- Problem List Details Patient Name: Date of Service: [name], Yajahira D. 03/10/2020 1:15 PM Medical Record Number: [id number] Patient Account Number: [id number] Date of Birth/Sex: Treating RN: 1970/08/07 (49 y.o. 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 03/10/2020 1:15 PM Medical Record Number: [id number] Patient Account Number: [id number] Date of Birth/Sex: Treating RN: Jun 01, 1970 (49 y.o. [name] Primary Care Provider: PA Haig Prophet, [place] Other Clinician: Referring Provider: Treating Provider/Extender: Cheree Ditto in Treatment: 0 Subjective Chief Complaint Information obtained from Patient 02/08/2020; patient is here for review of a wound on the left medial lower leg History of Present Illness  (HPI) ADMISSION 03/10/2020 This is a 49 year old woman who was recently moved to this area from [place] to help look after her mother who is ill. She has a several month history of a wound on the left medial lower leg. She was in the emergency room on 9/23 and 10/12. On both occasions she had x-rays that were negative and she was given doxycycline. On one occasion she had an Haematologist wrap. T the diameter of the wound on 10/12  was 6 cm I do not think a stat large today however her o compression stockings no longer fit because of swelling. Furthermore she tells me she has had external compression pumps but they are not working either. She comes with her last note from her primary doctor in [place] from June. She has a large list of prior diagnoses including iron deficiency anemia, anxiety, asthma, hypertension, coronary artery disease with an MI in 2007, cellulitis in 2019, congestive heart failure, history of a DVT on Eliquis with the advice to stay on this "lifelong", gastric ulcer, chronic blood loss history of an iron infusion given in 2019 with a complete GI work-up negative, osteoarthritis of the left knee, cyst history of a small bowel obstruction, upper GI bleed and prediabetes. She comes in with a long list of medications however the only medications she is taking currently are Eliquis 5 mg twice daily and amlodipine 10 mg daily. She is supposed to be taking atorvastatin, Lasix 80 mg a day, isosorbide mononitrate 30 mg a day, Klor-Con 20 mEq a day but she does not appear to be taking any of these. She tells me that she has had difficulty getting a primary doctor ABI in our clinic was 1.17 on the left Patient History Information obtained from Patient. Allergies ACE Inhibitors, hydromorphone, vancomycin, iron Family History Diabetes - Mother,Maternal Grandparents, Heart Disease - Paternal Grandparents, Hypertension - Mother,Maternal Grandparents, Lung Disease - Father,Paternal  Grandparents, No family history of Cancer, Hereditary Spherocytosis, Kidney Disease, Seizures, Stroke, Thyroid Problems, Tuberculosis. Social History Former smoker, Marital Status - Single, Alcohol Use - Never, Drug Use - No History, Caffeine Use - Daily. Medical History Eyes Denies history of Cataracts, Glaucoma, Optic Neuritis Ear/Nose/Mouth/Throat Denies history of Chronic sinus problems/congestion, Middle ear problems Hematologic/Lymphatic Denies history of Anemia, Hemophilia, Human Immunodeficiency Virus, Lymphedema, Sickle Cell Disease Respiratory Patient has history of Sleep Apnea Denies history of Aspiration, Asthma, Chronic Obstructive Pulmonary Disease (COPD), Pneumothorax, Tuberculosis Cardiovascular Patient has history of Congestive Heart Failure, Coronary Artery Disease, Hypertension Denies history of Angina, Arrhythmia, Deep Vein Thrombosis, Hypotension, Myocardial Infarction, Peripheral Arterial Disease, Peripheral Venous Disease, Phlebitis, Vasculitis Gastrointestinal Denies history of Cirrhosis , Colitis, Crohnoos, Hepatitis A, Hepatitis B, Hepatitis C Endocrine Denies history of Type I Diabetes, Type II Diabetes Genitourinary Denies history of End Stage Renal Disease Immunological Denies history of Lupus Erythematosus, Raynaudoos, Scleroderma Integumentary (Skin) Denies history of History of Burn Musculoskeletal Denies history of Gout, Rheumatoid Arthritis, Osteoarthritis, Osteomyelitis Neurologic Denies history of Dementia, Neuropathy, Quadriplegia, Paraplegia, Seizure Disorder Oncologic Denies history of Received Chemotherapy, Received Radiation Psychiatric Denies history of Anorexia/bulimia, Confinement Anxiety Review of Systems (ROS) Constitutional Symptoms (General Health) Denies complaints or symptoms of Fatigue, Fever, Chills, Marked Weight Change. Eyes Denies complaints or symptoms of Dry Eyes, Vision Changes, Glasses /  Contacts. Ear/Nose/Mouth/Throat Denies complaints or symptoms of Chronic sinus problems or rhinitis. Respiratory Denies complaints or symptoms of Chronic or frequent coughs, Shortness of Breath. Cardiovascular Denies complaints or symptoms of Chest pain. Gastrointestinal Denies complaints or symptoms of Frequent diarrhea, Nausea, Vomiting. Endocrine Denies complaints or symptoms of Heat/cold intolerance. Genitourinary Denies complaints or symptoms of Frequent urination. Integumentary (Skin) Complains or has symptoms of Wounds. Musculoskeletal Denies complaints or symptoms of Muscle Pain, Muscle Weakness. Neurologic Denies complaints or symptoms of Numbness/parasthesias. Psychiatric Denies complaints or symptoms of Claustrophobia, Suicidal. Objective Constitutional Patient is hypertensive. However it was repeated as in the normal range. Pulse regular and within target range for patient.[name] Respirations regular, non-labored and within target range.[name]  Temperature is normal and within the target range for the patient.[name] Appears in no distress. Vitals Time Taken: 1:49 PM, Height: 66 in, Source: Stated, Weight: 327 lbs, Source: Stated, BMI: 52.8, Temperature: 97.6 F, Pulse: 88 bpm, Respiratory Rate: 20 breaths/min, Blood Pressure: 143/101 mmHg. Respiratory work of breathing is normal. Bilateral breath sounds are clear and equal in all lobes with no wheezes, rales or rhonchi.. Cardiovascular Heart rhythm and rate regular, without murmur or gallop. JVP not elevated. Needle pulses palpable. Severe bilateral lymphedema. General Notes: Wound exam; the wound itself looks clean healthy looking granulation tissue. By description of the dimensions recorded in the ER last month the wound is gotten smaller. Integumentary (Hair, Skin) The patient's wound is in the left medial lower extremity. Surrounding hemosiderin deposition and tightly fibrosed skin. Wound #1 status is Open. Original cause of wound  was Gradually Appeared. The wound is located on the Left,Medial Lower Leg. The wound measures 4cm length x 3cm width x 0.1cm depth; 9.425cm^2 area and 0.942cm^3 volume. There is Fat Layer (Subcutaneous Tissue) exposed. There is no tunneling or undermining noted. There is a medium amount of serosanguineous drainage noted. There is large (67-100%) red granulation within the wound bed. There is no necrotic tissue within the wound bed. Assessment Active Problems ICD-10 Non-pressure chronic ulcer of other part of left lower leg with fat layer exposed Lymphedema, not elsewhere classified Chronic venous hypertension (idiopathic) with ulcer and inflammation of left lower extremity Procedures Wound #1 Pre-procedure diagnosis of Wound #1 is a Venous Leg Ulcer located on the Left,Medial Lower Leg . There was a Four Layer Compression Therapy Procedure by Deon Pilling, RN. Post procedure Diagnosis Wound #1: Same as Pre-Procedure Plan Follow-up Appointments: Return appointment in 3 weeks. - Fri 12/3 MD visit Nurse Visit: - Ludwig Clarks 11/19, Tues 11/23 Dressing Change Frequency: Wound #1 Left,Medial Lower Leg: Do not change entire dressing for one week. Skin Barriers/Peri-Wound Care: Moisturizing lotion TCA Cream or Ointment - mix with lotion to lower leg Wound Cleansing: May shower with protection. Primary Wound Dressing: Wound #1 Left,Medial Lower Leg: Calcium Alginate with Silver Secondary Dressing: Wound #1 Left,Medial Lower Leg: ABD pad Edema Control: 4 layer compression: Left lower extremity Avoid standing for long periods of time Elevate legs to the level of the heart or above for 30 minutes daily and/or when sitting, a frequency of: - throughout the day Exercise regularly Support Garment 20-30 mm/Hg pressure to: - to right leg daily Segmental Compressive Device. - lymphadema pumps 60 minutes 2 times per day, call manufacturer to fix pumps 1. Silver alginate/ABDs under 4-layer compression  to the left leg 2. I have asked her to keep her leg elevated she is already familiar with this 3. TCA to the periwound to help with pruritus 4. 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 In order to heal the wound like this the edema in the upper part of her lower leg needs to be controlled 5. I do not think there is any evidence of a DVT or cellulitis here 6. I saw no evidence of CHF. The patient is asking about her Lasix 80 mg I was reluctant to prescribe this with no other issues I could detect on physical exam I spent 35 minutes in review of this patient's past medical records, face-to-face evaluation and preparation of this note Electronic Signature(s) Signed: 03/13/2020 9:07:52 AM By: Linton Ham MD Entered By: Linton Ham on 03/10/2020 15:01:01 -------------------------------------------------------------------------------- HxROS Details Patient Name:  Date of Service: KALLYN, DEMARCUS D. 03/10/2020 1:15 PM Medical Record Number: [id number] Patient Account Number: [id number] Date of Birth/Sex: Treating RN: Sep 04, 1970 (49 y.o. Orvan Falconer Primary Care Provider: PA Haig Prophet, [place] Other Clinician: Referring Provider: Treating Provider/Extender: Cheree Ditto in Treatment: 0 Information Obtained From Patient Constitutional Symptoms (General Health) Complaints and Symptoms: Negative for: Fatigue; Fever; Chills; Marked Weight Change Eyes Complaints and Symptoms: Negative for: Dry Eyes; Vision Changes; Glasses / Contacts Medical History: Negative for: Cataracts; Glaucoma; Optic Neuritis Ear/Nose/Mouth/Throat Complaints and Symptoms: Negative for: Chronic sinus problems or rhinitis Medical History: Negative for: Chronic sinus problems/congestion; Middle ear problems Respiratory Complaints and Symptoms: Negative for: Chronic or frequent coughs; Shortness of Breath Medical History: Positive for: Sleep  Apnea Negative for: Aspiration; Asthma; Chronic Obstructive Pulmonary Disease (COPD); Pneumothorax; Tuberculosis Cardiovascular Complaints and Symptoms: Negative for: Chest pain Medical History: Positive for: Congestive Heart Failure; Coronary Artery Disease; Hypertension Negative for: Angina; Arrhythmia; Deep Vein Thrombosis; Hypotension; Myocardial Infarction; Peripheral Arterial Disease; Peripheral Venous Disease; Phlebitis; Vasculitis Gastrointestinal Complaints and Symptoms: Negative for: Frequent diarrhea; Nausea; Vomiting Medical History: Negative for: Cirrhosis ; Colitis; Crohns; Hepatitis A; Hepatitis B; Hepatitis C Endocrine Complaints and Symptoms: Negative for: Heat/cold intolerance Medical History: Negative for: Type I Diabetes; Type II Diabetes Genitourinary Complaints and Symptoms: Negative for: Frequent urination Medical History: Negative for: End Stage Renal Disease Integumentary (Skin) Complaints and Symptoms: Positive for: Wounds Medical History: Negative for: History of Burn Musculoskeletal Complaints and Symptoms: Negative for: Muscle Pain; Muscle Weakness Medical History: Negative for: Gout; Rheumatoid Arthritis; Osteoarthritis; Osteomyelitis Neurologic Complaints and Symptoms: Negative for: Numbness/parasthesias Medical History: Negative for: Dementia; Neuropathy; Quadriplegia; Paraplegia; Seizure Disorder Psychiatric Complaints and Symptoms: Negative for: Claustrophobia; Suicidal Medical History: Negative for: Anorexia/bulimia; Confinement Anxiety Hematologic/Lymphatic Medical History: Negative for: Anemia; Hemophilia; Human Immunodeficiency Virus; Lymphedema; Sickle Cell Disease Immunological Medical History: Negative for: Lupus Erythematosus; Raynauds; Scleroderma Oncologic Medical History: Negative for: Received Chemotherapy; Received Radiation Immunizations Pneumococcal Vaccine: Received Pneumococcal Vaccination: No Implantable  Devices None Family and Social History Cancer: No; Diabetes: Yes - Mother,Maternal Grandparents; Heart Disease: Yes - Paternal Grandparents; Hereditary Spherocytosis: No; Hypertension: Yes - Mother,Maternal Grandparents; Kidney Disease: No; Lung Disease: Yes - Father,Paternal Grandparents; Seizures: No; Stroke: No; Thyroid Problems: No; Tuberculosis: No; Former smoker; Marital Status - Single; Alcohol Use: Never; Drug Use: No History; Caffeine Use: Daily; Financial Concerns: No; Food, Clothing or Shelter Needs: No; Support System Lacking: No; Transportation Concerns: No Electronic Signature(s) Signed: 03/10/2020 5:42:44 PM By: Carlene Coria RN Signed: 03/13/2020 9:07:52 AM By: Linton Ham MD Entered By: Carlene Coria on 03/10/2020 14:01:11 -------------------------------------------------------------------------------- SuperBill Details Patient Name: Date of Service: [name], Takari D. 03/10/2020 Medical Record Number: [id number] Patient Account Number: [id number] Date of Birth/Sex: Treating RN: [birth year] (49 y.o. [name] Primary Care Provider: PA TIENT, [place] Other Clinician: Referring Provider: Treating Provider/Extender: Cheree Ditto in Treatment: 0 Diagnosis Coding ICD-10 Codes Code Description [phone] Non-pressure chronic ulcer of other part of left lower leg with fat layer exposed I89.0 Lymphedema, not elsewhere classified I87.332 Chronic venous hypertension (idiopathic) with ulcer and inflammation of left lower extremity Facility Procedures CPT4 Code: [id number] Description: 99213 - WOUND CARE VISIT-LEV 3 EST PT Modifier: 25 Quantity: 1 CPT4 Code: [id number] Description: (Facility Use Only) 29581LT - APPLY MULTLAY COMPRS LWR LT LEG Modifier: Quantity: 1 Physician Procedures : CPT4 Code Description Modifier [id number] WC PHYS LEVEL 3 NEW PT ICD-10 Diagnosis Description L97.822 Non-pressure chronic ulcer of other part of left lower leg with fat  layer exposed  I89.0 Lymphedema, not elsewhere classified I87.332 Chronic venous  hypertension (idiopathic) with ulcer and inflammation of left lower extremity Quantity: 1 Electronic Signature(s) Signed: 03/13/2020 9:07:52 AM By: Linton Ham MD Entered By: Linton Ham on 03/10/2020 15:01:27

## 2020-03-13 NOTE — Progress Notes (Signed)
BRAYLINN, GULDEN ([id number]) Visit Report for 03/10/2020 Allergy List Details Patient Name: Date of Service: [name], [name] 03/10/2020 1:15 PM Medical Record Number: [id number] Patient Account Number: [id number] Date of Birth/Sex: Treating RN: 07-17-1970 (49 y.o. Orvan Falconer Primary Care Jeffery Bachmeier: PA Haig [place], [place] Other Clinician: Referring Jene Oravec: Treating Cebert Dettmann/Extender: Cheree Ditto in Treatment: 0 Allergies Active Allergies ACE Inhibitors hydromorphone vancomycin iron Allergy Notes Electronic Signature(s) Signed: 03/10/2020 5:42:44 PM By: Carlene Coria RN Entered By: Carlene Coria on 03/10/2020 13:55:57 -------------------------------------------------------------------------------- Arrival Information Details Patient Name: Date of Service: [name], [name] 03/10/2020 1:15 PM Medical Record Number: [id number] Patient Account Number: [id number] Date of Birth/Sex: Treating RN: 05-15-1970 (49 y.o. Orvan Falconer Primary Care Weslee Prestage: PA Haig [place], [place] Other Clinician: Referring Tyjanae Bartek: Treating Neya Creegan/Extender: Cheree Ditto in Treatment: 0 Visit Information Patient Arrived: Ambulatory Arrival Time: 13:49 Accompanied By: self Transfer Assistance: None Patient Identification Verified: Yes Secondary Verification Process Completed: Yes Patient Requires Transmission-Based Precautions: No Patient Has Alerts: Yes Patient Alerts: Patient on Blood Thinner Electronic Signature(s) Signed: 03/10/2020 5:42:44 PM By: Carlene Coria RN Entered By: Carlene Coria on 03/10/2020 13:49:54 -------------------------------------------------------------------------------- Clinic Level of Care Assessment Details Patient Name: Date of Service: [name], [name] 03/10/2020 1:15 PM Medical Record Number: [id number] Patient Account Number: [id number] Date of Birth/Sex: Treating RN: [birth year] (49 y.o. [name] Primary Care Tiffane Sheldon: PA Haig [place], [place] Other  Clinician: Referring Cleora Karnik: Treating Olvin Rohr/Extender: Cheree Ditto in Treatment: 0 Clinic Level of Care Assessment Items TOOL 1 Quantity Score []  - 0 Use when EandM and Procedure is performed on INITIAL visit ASSESSMENTS - Nursing Assessment / Reassessment X- 1 20 General Physical Exam (combine w/ comprehensive assessment (listed just below) when performed on new pt. evals) X- 1 25 Comprehensive Assessment (HX, ROS, Risk Assessments, Wounds Hx, etc.) ASSESSMENTS - Wound and Skin Assessment / Reassessment []  - 0 Dermatologic / Skin Assessment (not related to wound area) ASSESSMENTS - Ostomy and/or Continence Assessment and Care []  - 0 Incontinence Assessment and Management []  - 0 Ostomy Care Assessment and Management (repouching, etc.) PROCESS - Coordination of Care X - Simple Patient / Family Education for ongoing care 1 15 []  - 0 Complex (extensive) Patient / Family Education for ongoing care X- 1 10 Staff obtains Programmer, systems, Records, T Results / Process Orders est []  - 0 Staff telephones HHA, Nursing Homes / Clarify orders / etc []  - 0 Routine Transfer to another Facility (non-emergent condition) []  - 0 Routine Hospital Admission (non-emergent condition) X- 1 15 New Admissions / Biomedical engineer / Ordering NPWT Apligraf, etc. , []  - 0 Emergency Hospital Admission (emergent condition) PROCESS - Special Needs []  - 0 Pediatric / Minor Patient Management []  - 0 Isolation Patient Management []  - 0 Hearing / Language / Visual special needs []  - 0 Assessment of Community assistance (transportation, D/C planning, etc.) []  - 0 Additional assistance / Altered mentation []  - 0 Support Surface(s) Assessment (bed, cushion, seat, etc.) INTERVENTIONS - Miscellaneous []  - 0 External ear exam []  - 0 Patient Transfer (multiple staff / Civil Service fast streamer / Similar devices) []  - 0 Simple Staple / Suture removal (25 or less) []  - 0 Complex Staple / Suture removal  (26 or more) []  - 0 Hypo/Hyperglycemic Management (do not check if billed separately) X- 1 15 Ankle / Brachial Index (ABI) - do not check if billed separately Has the patient been seen at the hospital within the last three years: Yes Total Score: 100  Level Of Care: New/Established - Level 3 Electronic Signature(s) Signed: 03/10/2020 5:58:22 PM By: Baruch Gouty RN, BSN Signed: 03/10/2020 5:58:22 PM By: Baruch Gouty RN, BSN Entered By: Baruch Gouty on 03/10/2020 14:35:06 -------------------------------------------------------------------------------- Compression Therapy Details Patient Name: Date of Service: [name], [name] 03/10/2020 1:15 PM Medical Record Number: [id number] Patient Account Number: [id number] Date of Birth/Sex: Treating RN: Jan 15, 1971 (49 y.o. [name] Primary Care [name]: PA Haig [place], [place] Other Clinician: Referring [name]: Treating [name]/Extender: Cheree Ditto in Treatment: 0 Compression Therapy Performed for Wound Assessment: Wound #1 Left,Medial Lower Leg Performed By: Clinician Deon Pilling, RN Compression Type: Four Layer Post Procedure Diagnosis Same as Pre-procedure Electronic Signature(s) Signed: 03/10/2020 5:58:22 PM By: Baruch Gouty RN, BSN Entered By: Baruch Gouty on 03/10/2020 14:35:28 -------------------------------------------------------------------------------- Encounter Discharge Information Details Patient Name: Date of Service: [name], [name] 03/10/2020 1:15 PM Medical Record Number: [id number] Patient Account Number: [id number] Date of Birth/Sex: Treating RN: [birth year] (49 y.o. [name] Primary Care [name]: PA Haig [place], [place] Other Clinician: Referring Angee Gupton: Treating [name]/Extender: Cheree Ditto in Treatment: 0 Encounter Discharge Information Items Discharge Condition: Stable Ambulatory Status: Ambulatory Discharge Destination: Home Transportation: Private  Auto Accompanied By: self Schedule Follow-up Appointment: Yes Clinical Summary of Care: Electronic Signature(s) Signed: 03/10/2020 5:09:42 PM By: Deon Pilling Entered By: Deon Pilling on 03/10/2020 15:32:33 -------------------------------------------------------------------------------- Lower Extremity Assessment Details Patient Name: Date of Service: [name], [name] 03/10/2020 1:15 PM Medical Record Number: [id number] Patient Account Number: [id number] Date of Birth/Sex: Treating RN: October 21, 1970 (49 y.o. Orvan Falconer Primary Care Boysie Bonebrake: PA [place]ig [place], [place] Other Clinician: Referring Ixel Boehning: Treating Guynell Kleiber/Extender: Cheree Ditto in Treatment: 0 Edema Assessment Assessed: [Left: No] [Right: No] E[Left: dema] [Right: :] Calf Left: Right: Point of Measurement: 42 cm From Medial Instep 57 cm Ankle Left: Right: Point of Measurement: 11 cm From Medial Instep 26.5 cm Vascular Assessment Blood Pressure: Brachial: [Left:121] Ankle: [Left:Dorsalis Pedis: 142 1.17] Electronic Signature(s) Signed: 03/10/2020 5:42:44 PM By: Carlene Coria RN Entered By: Carlene Coria on 03/10/2020 14:15:44 -------------------------------------------------------------------------------- Multi Wound Chart Details Patient Name: Date of Service: [name], [name] 03/10/2020 1:15 PM Medical Record Number: [id number] Patient Account Number: [id number] Date of Birth/Sex: Treating RN: 1970-09-25 (49 y.o. [name] Primary Care Tan Clopper: PA Haig [place], [place] Other Clinician: Referring Dequan Kindred: Treating Smantha Boakye/Extender: Cheree Ditto in Treatment: 0 Vital Signs Height(in): 66 Pulse(bpm): 88 Weight(lbs): 327 Blood Pressure(mmHg): 143/101 Body Mass Index(BMI): 53 Temperature(F): 97.6 Respiratory Rate(breaths/min): 20 Photos: [1:No Photos Left, Medial Lower Leg] [N/A:N/A N/A] Wound Location: [1:Gradually Appeared] [N/A:N/A] Wounding Event: [1:Venous Leg Ulcer]  [N/A:N/A] Primary Etiology: [1:Sleep Apnea, Congestive Heart] [N/A:N/A] Comorbid History: [1:Failure, Coronary Artery Disease, Hypertension 10/28/2019] [N/A:N/A] Date Acquired: [1:0] [N/A:N/A] Weeks of Treatment: [1:Open] [N/A:N/A] Wound Status: [1:4x3x0.1] [N/A:N/A] Measurements L x W x D (cm) [1:9.425] [N/A:N/A] A (cm) : rea [1:0.942] [N/A:N/A] Volume (cm) : [1:Full Thickness Without Exposed] [N/A:N/A] Classification: [1:Support Structures Medium] [N/A:N/A] Exudate Amount: [1:Serosanguineous] [N/A:N/A] Exudate Type: [1:red, brown] [N/A:N/A] Exudate Color: [1:Large (67-100%)] [N/A:N/A] Granulation Amount: [1:Red] [N/A:N/A] Granulation Quality: [1:None Present (0%)] [N/A:N/A] Necrotic Amount: [1:Fat Layer (Subcutaneous Tissue): Yes N/A] Exposed Structures: [1:Fascia: No Tendon: No Muscle: No Joint: No Bone: No None] [N/A:N/A] Epithelialization: [1:Compression Therapy] [N/A:N/A] Treatment Notes Electronic Signature(s) Signed: 03/10/2020 5:58:22 PM By: Baruch Gouty RN, BSN Signed: 03/13/2020 9:07:52 AM By: Linton Ham MD Entered By: Linton Ham on 03/10/2020 14:44:28 -------------------------------------------------------------------------------- Multi-Disciplinary Care Plan Details Patient Name: Date of Service: [name], [name] 03/10/2020 1:15  PM Medical Record Number: [id number] Patient Account Number: [id number] Date of Birth/Sex: Treating RN: July 05, 1970 (49 y.o. [name] Primary Care Jahad Old: PA Haig [place], [place] Other Clinician: Referring Camyla Camposano: Treating Amon Costilla/Extender: Cheree Ditto in Treatment: 0 Active Inactive Venous Leg Ulcer Nursing Diagnoses: Knowledge deficit related to disease process and management Potential for venous Insuffiency (use before diagnosis confirmed) Goals: Patient will maintain optimal edema control Date Initiated: 03/10/2020 Target Resolution Date: 04/07/2020 Goal Status: Active Interventions: Assess  peripheral edema status every visit. Compression as ordered Provide education on venous insufficiency Treatment Activities: Therapeutic compression applied : 03/10/2020 Notes: Wound/Skin Impairment Nursing Diagnoses: Impaired tissue integrity Knowledge deficit related to ulceration/compromised skin integrity Goals: Patient/caregiver will verbalize understanding of skin care regimen Date Initiated: 03/10/2020 Target Resolution Date: 04/07/2020 Goal Status: Active Ulcer/skin breakdown will have a volume reduction of 30% by week 4 Date Initiated: 03/10/2020 Target Resolution Date: 04/07/2020 Goal Status: Active Interventions: Assess patient/caregiver ability to obtain necessary supplies Assess patient/caregiver ability to perform ulcer/skin care regimen upon admission and as needed Assess ulceration(s) every visit Provide education on ulcer and skin care Treatment Activities: Skin care regimen initiated : 03/10/2020 Topical wound management initiated : 03/10/2020 Notes: Electronic Signature(s) Signed: 03/10/2020 5:58:22 PM By: Baruch Gouty RN, BSN Entered By: Baruch Gouty on 03/10/2020 14:33:57 -------------------------------------------------------------------------------- Pain Assessment Details Patient Name: Date of Service: [name], Jeaninne D. 03/10/2020 1:15 PM Medical Record Number: [id number] Patient Account Number: [id number] Date of Birth/Sex: Treating RN: 1970-08-10 (49 y.o. Orvan Falconer Primary Care Lesly Pontarelli: PA Haig [place], [place] Other Clinician: Referring Kaylib Furness: Treating Srinidhi Landers/Extender: Cheree Ditto in Treatment: 0 Active Problems Location of Pain Severity and Description of Pain Patient Has Paino No Site Locations Pain Management and Medication Current Pain Management: Electronic Signature(s) Signed: 03/10/2020 5:42:44 PM By: Carlene Coria RN Entered By: Carlene Coria on 03/10/2020  14:17:22 -------------------------------------------------------------------------------- Patient/Caregiver Education Details Patient Name: Date of Service: [name] 11/12/2021andnbsp1:15 PM Medical Record Number: [id number] Patient Account Number: [id number] Date of Birth/Gender: Treating RN: [birth year] (49 y.o. [name] Primary Care Physician: PA Haig [place], [place] Other Clinician: Referring Physician: Treating Physician/Extender: Cheree Ditto in Treatment: 0 Education Assessment Education Provided To: Patient Education Topics Provided Venous: Handouts: Controlling Swelling with Multilayered Compression Wraps Methods: Explain/Verbal, Printed Responses: Reinforcements needed, State content correctly [place]: o Handouts: Welcome T [facility] [place] o Methods: Explain/Verbal, Printed Responses: Reinforcements needed, State content correctly Wound/Skin Impairment: Methods: Explain/Verbal Responses: Reinforcements needed, State content correctly Electronic Signature(s) Signed: 03/10/2020 5:58:22 PM By: Baruch Gouty RN, BSN Entered By: Baruch Gouty on 03/10/2020 14:34:38 -------------------------------------------------------------------------------- Wound Assessment Details Patient Name: Date of Service: [name], [name] 03/10/2020 1:15 PM Medical Record Number: [id number] Patient Account Number: [id number] Date of Birth/Sex: Treating RN: 09/15/1970 (49 y.o. Orvan Falconer Primary Care Roshini Fulwider: PA Haig [place], [place] Other Clinician: Referring Aljean Horiuchi: Treating Abdulwahab Demelo/Extender: Cheree Ditto in Treatment: 0 Wound Status Wound Number: 1 Primary Venous Leg Ulcer Etiology: Wound Location: Left, Medial Lower Leg Wound Open Wounding Event: Gradually Appeared Status: Date Acquired: 10/28/2019 Comorbid Sleep Apnea, Congestive Heart Failure, Coronary Artery Weeks Of Treatment: 0 History: Disease,  Hypertension Clustered Wound: No Wound Measurements Length: (cm) 4 Width: (cm) 3 Depth: (cm) 0.1 Area: (cm) 9.425 Volume: (cm) 0.942 % Reduction in Area: % Reduction in Volume: Epithelialization: None Tunneling: No Undermining: No Wound Description Classification: Full Thickness Without Exposed Support Structures Exudate Amount: Medium Exudate Type: Serosanguineous Exudate Color: red, brown Foul Odor After  Cleansing: No Slough/Fibrino No Wound Bed Granulation Amount: Large (67-100%) Exposed Structure Granulation Quality: Red Fascia Exposed: No Necrotic Amount: None Present (0%) Fat Layer (Subcutaneous Tissue) Exposed: Yes Tendon Exposed: No Muscle Exposed: No Joint Exposed: No Bone Exposed: No Treatment Notes Wound #1 (Left, Medial Lower Leg) 1. Cleanse With Wound Cleanser Soap and water 2. Periwound Care Moisturizing lotion TCA Cream 3. Primary Dressing Applied Calcium Alginate Ag 4. Secondary Dressing ABD Pad Dry Gauze 6. Support Layer Applied 4 layer compression wrap Notes netting. explained the orders, dressings, frequency of change, and when to return to wound center. Electronic Signature(s) Signed: 03/10/2020 5:42:44 PM By: Carlene Coria RN Entered By: Carlene Coria on 03/10/2020 14:17:09 -------------------------------------------------------------------------------- Vitals Details Patient Name: Date of Service: [name], [name] 03/10/2020 1:15 PM Medical Record Number: [id number] Patient Account Number: [id number] Date of Birth/Sex: Treating RN: 09/08/1970 (49 y.o. Orvan Falconer Primary Care Jennings Stirling: PA Haig [place], [place] Other Clinician: Referring Fransico Sciandra: Treating Dim Meisinger/Extender: Cheree Ditto in Treatment: 0 Vital Signs Time Taken: 13:49 Temperature (F): 97.6 Height (in): 66 Pulse (bpm): 88 Source: Stated Respiratory Rate (breaths/min): 20 Weight (lbs): 327 Blood Pressure (mmHg): 143/101 Source: Stated Reference Range: 80  - 120 mg / dl Body Mass Index (BMI): 52.8 Electronic Signature(s) Signed: 03/10/2020 5:42:44 PM By: Carlene Coria RN Entered By: Carlene Coria on 03/10/2020 13:50:33

## 2020-03-17 ENCOUNTER — Other Ambulatory Visit: Payer: Self-pay

## 2020-03-17 ENCOUNTER — Encounter (HOSPITAL_BASED_OUTPATIENT_CLINIC_OR_DEPARTMENT_OTHER): Payer: PRIVATE HEALTH INSURANCE | Admitting: Internal Medicine

## 2020-03-17 DIAGNOSIS — I87332 Chronic venous hypertension (idiopathic) with ulcer and inflammation of left lower extremity: Secondary | ICD-10-CM | POA: Diagnosis not present

## 2020-03-17 NOTE — Progress Notes (Signed)
CIRCE, CHILTON ([id number]) Visit Report for 03/17/2020 Arrival Information Details Patient Name: Date of Service: [name], [name] 03/17/2020 10:00 A M Medical Record Number: [id number] Patient Account Number: [id number] Date of Birth/Sex: Treating RN: 1970-05-09 (49 y.o. [name] Primary Care Feven Alderfer: PA [place], [place] Other Clinician: Referring Lemon Sternberg: Treating Farron Watrous/Extender: Cheree Ditto in Treatment: 1 Visit Information History Since Last Visit Added or deleted any medications: No Patient Arrived: Ambulatory Any new allergies or adverse reactions: No Arrival Time: 10:05 Had a fall or experienced change in No Accompanied By: self activities of daily living that may affect Transfer Assistance: None risk of falls: Patient Identification Verified: Yes Signs or symptoms of abuse/neglect since last visito No Secondary Verification Process Completed: Yes Hospitalized since last visit: No Patient Requires Transmission-Based Precautions: No Implantable device outside of the clinic excluding No Patient Has Alerts: Yes cellular tissue based products placed in the center Patient Alerts: Patient on Blood Thinner since last visit: Has Dressing in Place as Prescribed: Yes Has Compression in Place as Prescribed: Yes Pain Present Now: No Electronic Signature(s) Signed: 03/17/2020 12:02:00 PM By: Deon Pilling Entered By: Deon Pilling on 03/17/2020 10:25:31 -------------------------------------------------------------------------------- Compression Therapy Details Patient Name: Date of Service: Megan Session D. 03/17/2020 10:00 A M Medical Record Number: [id number] Patient Account Number: [id number] Date of Birth/Sex: Treating RN: 1970/05/23 (49 y.o. [name] Primary Care Channel Papandrea: PA [place]ig [place], [place] Other Clinician: Referring Halea Lieb: Treating Chasty Randal/Extender: Cheree Ditto in Treatment: 1 Compression Therapy Performed for Wound Assessment:  Wound #1 Left,Medial Lower Leg Performed By: Clinician Deon Pilling, RN Compression Type: Four Layer Electronic Signature(s) Signed: 03/17/2020 12:02:00 PM By: Deon Pilling Entered By: Deon Pilling on 03/17/2020 10:26:35 -------------------------------------------------------------------------------- Encounter Discharge Information Details Patient Name: Date of Service: Megan Session D. 03/17/2020 10:00 A M Medical Record Number: [id number] Patient Account Number: [id number] Date of Birth/Sex: Treating RN: Mar 18, 1971 (49 y.o. [name] Primary Care Trayvond Viets: PA [place]ig [place], [place] Other Clinician: Referring Madeeha Costantino: Treating Daren Yeagle/Extender: Cheree Ditto in Treatment: 1 Encounter Discharge Information Items Discharge Condition: Stable Ambulatory Status: Ambulatory Discharge Destination: Home Transportation: Private Auto Accompanied By: self Schedule Follow-up Appointment: Yes Clinical Summary of Care: Electronic Signature(s) Signed: 03/17/2020 12:02:00 PM By: Deon Pilling Entered By: Deon Pilling on 03/17/2020 10:27:29 -------------------------------------------------------------------------------- Patient/Caregiver Education Details Patient Name: Date of Service: Megan Session D. 11/19/2021andnbsp10:00 [place] Record Number: [id number] Patient Account Number: [id number] Date of Birth/Gender: Treating RN: Jun 17, 1970 (49 y.o. [name] Primary Care Physician: PA Haig [place], [place] Other Clinician: Referring Physician: Treating Physician/Extender: Cheree Ditto in Treatment: 1 Education Assessment Education Provided To: Patient Education Topics Provided Venous: Handouts: Managing Venous Disease and Related Ulcers Methods: Explain/Verbal Responses: Reinforcements needed Electronic Signature(s) Signed: 03/17/2020 12:02:00 PM By: Deon Pilling Entered By: Deon Pilling on 03/17/2020  10:27:20 -------------------------------------------------------------------------------- Wound Assessment Details Patient Name: Date of Service: Megan Session D. 03/17/2020 10:00 A M Medical Record Number: [id number] Patient Account Number: [id number] Date of Birth/Sex: Treating RN: 23-Jun-1970 (49 y.o. [name] Primary Care Darly Fails: PA [place], [place] Other Clinician: Referring Murray Durrell: Treating Maryssa Giampietro/Extender: Cheree Ditto in Treatment: 1 Wound Status Wound Number: 1 Primary Etiology: Venous Leg Ulcer Wound Location: Left, Medial Lower Leg Wound Status: Open Wounding Event: Gradually Appeared Date Acquired: 10/28/2019 Weeks Of Treatment: 1 Clustered Wound: No Wound Measurements Length: (cm) 4 Width: (cm) 3 Depth: (cm) 0.1 Area: (cm) 9.425 Volume: (cm) 0.942 % Reduction in Area: 0% % Reduction in  Volume: 0% Wound Description Classification: Full Thickness Without Exposed Support Structu res Treatment Notes Wound #1 (Left, Medial Lower Leg) 1. Cleanse With Wound Cleanser Soap and water 2. Periwound Care Moisturizing lotion TCA Cream 3. Primary Dressing Applied Calcium Alginate Ag 4. Secondary Dressing Dry Gauze 6. Support Layer Applied 4 layer compression wrap Notes netting. unna boot first layer applied to secure compression wrap in place. Electronic Signature(s) Signed: 03/17/2020 12:02:00 PM By: Deon Pilling Entered By: Deon Pilling on 03/17/2020 10:26:25 -------------------------------------------------------------------------------- Vitals Details Patient Name: Date of Service: Megan Gilding, Yulianna D. 03/17/2020 10:00 A M Medical Record Number: [id number] Patient Account Number: [id number] Date of Birth/Sex: Treating RN: 17-Sep-1970 (49 y.o. [name] Primary Care Kamaya Keckler: PA [place], [place] Other Clinician: Referring Jumana Paccione: Treating Janin Kozlowski/Extender: Cheree Ditto in Treatment: 1 Vital Signs Time Taken:  10:06 Temperature (F): 98 Height (in): 66 Pulse (bpm): 91 Weight (lbs): 327 Respiratory Rate (breaths/min): 20 Body Mass Index (BMI): 52.8 Blood Pressure (mmHg): 140/96 Reference Range: 80 - 120 mg / dl Electronic Signature(s) Signed: 03/17/2020 12:02:00 PM By: Deon Pilling Entered By: Deon Pilling on 03/17/2020 10:26:19

## 2020-03-27 ENCOUNTER — Encounter (HOSPITAL_BASED_OUTPATIENT_CLINIC_OR_DEPARTMENT_OTHER): Payer: PRIVATE HEALTH INSURANCE | Admitting: Internal Medicine

## 2020-03-27 ENCOUNTER — Other Ambulatory Visit: Payer: Self-pay

## 2020-03-27 DIAGNOSIS — I87332 Chronic venous hypertension (idiopathic) with ulcer and inflammation of left lower extremity: Secondary | ICD-10-CM | POA: Diagnosis not present

## 2020-03-27 NOTE — Progress Notes (Signed)
KATELYNN, HEIDLER ([id number]) Visit Report for 03/17/2020 SuperBill Details Patient Name: Date of Service: DESIREA, MIZRAHI 03/17/2020 Medical Record Number: [id number] Patient Account Number: [id number] Date of Birth/Sex: Treating RN: 03-26-1971 (49 y.o. [name] Primary Care Provider: PA [place], [place] Other Clinician: Referring Provider: Treating Provider/Extender: Cheree Ditto in Treatment: 1 Diagnosis Coding ICD-10 Codes Code Description [phone] Non-pressure chronic ulcer of other part of left lower leg with fat layer exposed I89.0 Lymphedema, not elsewhere classified I87.332 Chronic venous hypertension (idiopathic) with ulcer and inflammation of left lower extremity Facility Procedures CPT4 Code Description Modifier Quantity [id number] (Facility Use Only) [phone] - [place] 1 Electronic Signature(s) Signed: 03/17/2020 12:02:00 PM By: Deon Pilling Signed: 03/27/2020 4:47:57 PM By: Linton Ham MD Entered By: Deon Pilling on 03/17/2020 10:27:36

## 2020-03-27 NOTE — Progress Notes (Signed)
Megan, [name] ([id number]) Visit Report for 03/27/2020 HPI Details Patient Name: Date of Service: Megan, [name] 03/27/2020 2:45 PM Medical Record Number: [id number] Patient Account Number: [id number] Date of Birth/Sex: Treating RN: [name] (49 y.o. [name] Primary Care Provider: PA Haig Prophet, NO Other Clinician: Referring Provider: Treating Provider/Extender: Cheree Ditto in Treatment: 2 History of Present Illness HPI Description: ADMISSION 03/10/2020 This is a 49 year old woman who was recently moved to this area from [place] to help look after her mother who is ill. She has a several month history of a wound on the left medial lower leg. She was in the emergency room on 9/23 and 10/12. On both occasions she had x-rays that were negative and she was given doxycycline. On one occasion she had an Haematologist wrap. T the diameter of the wound on 10/12 was 6 cm I do not think a stat large today however her o compression stockings no longer fit because of swelling. Furthermore she tells me she has had external compression pumps but they are not working either. She comes with her last note from her primary doctor in [place] from June. She has a large list of prior diagnoses including iron deficiency anemia, anxiety, asthma, hypertension, coronary artery disease with an MI in 2007, cellulitis in 2019, congestive heart failure, history of a DVT on Eliquis with the advice to stay on this "lifelong", gastric ulcer, chronic blood loss history of an iron infusion given in 2019 with a complete GI work-up negative, osteoarthritis of the left knee, cyst history of a small bowel obstruction, upper GI bleed and prediabetes. She comes in with a long list of medications however the only medications she is taking currently are Eliquis 5 mg twice daily and amlodipine 10 mg daily. She is supposed to be taking atorvastatin, Lasix 80 mg a day, isosorbide mononitrate 30 mg a day,  Klor-Con 20 mEq a day but she does not appear to be taking any of these. She tells me that she has had difficulty getting a primary doctor ABI in our clinic was 1.17 on the left 03/27/2020; patient has a wound on the left anterior lower leg in the setting of chronic lymphedema. The wound margins have been coming in nicely. Electronic Signature(s) Signed: 03/27/2020 4:47:57 PM By: Linton Ham MD Entered By: Linton Ham on 03/27/2020 [name]:43:17 -------------------------------------------------------------------------------- Physical Exam Details Patient Name: Date of Service: Megan Session D. 03/27/2020 2:45 PM Medical Record Number: [id number] Patient Account Number: [id number] Date of Birth/Sex: Treating RN: [birth year] (49 y.o. [name] Primary Care Provider: PA Darnelle Spangle Other Clinician: Referring Provider: Treating Provider/Extender: Cheree Ditto in Treatment: 2 Constitutional Patient is hypertensive.. Pulse regular and within target range for patient.[name] Respirations regular, non-labored and within target range.. Temperature is normal and within the target range for the patient.[name] Appears in no distress. Cardiovascular Pedal pulses are palpable on the left. Notes Wound exam; nice healthy looking granulation tissue on the F left anterior lower leg. There is some dry flaking skin around this which I removed with wound cleanser and gauze otherwise the surface of this looks very healthy. We have good edema control. 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 Lymphedema starts in the upper lower leg. Electronic Signature(s) Signed: 03/27/2020 4:47:57 PM By: Linton Ham MD Entered By: Linton Ham on 03/27/2020 [name]:44:38 -------------------------------------------------------------------------------- Physician Orders Details Patient Name: Date of Service: Megan Session D. 03/27/2020 2:45 PM Medical Record Number: [id number] Patient  Account  Number: [id number] Date of Birth/Sex: Treating RN: [birth year]-[name] (49 y.o. [name] Primary Care Provider: PA Haig Prophet, NO Other Clinician: Referring Provider: Treating Provider/Extender: Cheree Ditto in Treatment: 2 Verbal / Phone Orders: No Diagnosis Coding Follow-up Appointments Return Appointment in 1 week. Dressing Change Frequency Wound #1 Left,Medial Lower Leg Do not change entire dressing for one week. Skin Barriers/Peri-Wound Care Moisturizing lotion TCA Cream or Ointment - mix with lotion to lower leg Wound Cleansing May shower with protection. Primary Wound Dressing Wound #1 Left,Medial Lower Leg Calcium Alginate with Silver Secondary Dressing Wound #1 Left,Medial Lower Leg ABD pad Edema Control 4 layer compression: Left lower extremity Avoid standing for long periods of time Elevate legs to the level of the heart or above for 30 minutes daily and/or when sitting, a frequency of: - throughout the day Exercise regularly Support Garment 20-30 mm/Hg pressure to: - to right leg daily Segmental Compressive Device. - lymphadema pumps 60 minutes 2 times per day, call manufacturer to fix pumps Electronic Signature(s) Signed: 03/27/2020 4:47:57 PM By: Linton Ham MD Signed: 03/27/2020 5:13:46 PM By: Rhae Hammock RN Previous Signature: 03/27/2020 3:34:28 PM Version By: Rhae Hammock RN Entered By: Rhae Hammock on 03/27/2020 [name]:41:18 -------------------------------------------------------------------------------- Problem List Details Patient Name: Date of Service: Megan Session D. 03/27/2020 2:45 PM Medical Record Number: [id number] Patient Account Number: [id number] Date of Birth/Sex: Treating RN: [name], [name] (49 y.o. [name] Primary Care Provider: PA Haig Prophet, NO Other Clinician: Referring Provider: Treating Provider/Extender: Cheree Ditto in Treatment: 2 Active Problems ICD-10 Encounter Code Description Active  Date MDM Diagnosis L97.822 Non-pressure chronic ulcer of other part of left lower leg with fat layer exposed11/03/2020 No Yes I89.0 Lymphedema, not elsewhere classified 03/10/2020 No Yes I87.332 Chronic venous hypertension (idiopathic) with ulcer and inflammation of left 03/10/2020 No Yes lower extremity Inactive Problems Resolved Problems Electronic Signature(s) Signed: 03/27/2020 4:47:57 PM By: Linton Ham MD Entered By: Linton Ham on 03/27/2020 [name]:41:56 -------------------------------------------------------------------------------- Progress Note Details Patient Name: Date of Service: [name], [name] 03/27/2020 2:45 PM Medical Record Number: [id number] Patient Account Number: [id number] Date of Birth/Sex: Treating RN: [birth year] [name] (49 y.o. [name] Primary Care Provider: PA Haig Prophet, NO Other Clinician: Referring Provider: Treating Provider/Extender: Cheree Ditto in Treatment: 2 Subjective History of Present Illness (HPI) ADMISSION 03/10/2020 This is a 49 year old woman who was recently moved to this area from [place] to help look after her mother who is ill. She has a several month history of a wound on the left medial lower leg. She was in the emergency room on 9/23 and 10/12. On both occasions she had x-rays that were negative and she was given doxycycline. On one occasion she had an Haematologist wrap. T the diameter of the wound on 10/12 was 6 cm I do not think a stat large today however her o compression stockings no longer fit because of swelling. Furthermore she tells me she has had external compression pumps but they are not working either. She comes with her last note from her primary doctor in [place] from June. She has a large list of prior diagnoses including iron deficiency anemia, anxiety, asthma, hypertension, coronary artery disease with an MI in 2007, cellulitis in 2019, congestive heart failure, history of a DVT on Eliquis with the advice  to stay on this "lifelong", gastric ulcer, chronic blood loss history of an iron infusion given in 2019 with a complete GI work-up negative, osteoarthritis of the left knee, cyst history  of a small bowel obstruction, upper GI bleed and prediabetes. She comes in with a long list of medications however the only medications she is taking currently are Eliquis 5 mg twice daily and amlodipine 10 mg daily. She is supposed to be taking atorvastatin, Lasix 80 mg a day, isosorbide mononitrate 30 mg a day, Klor-Con 20 mEq a day but she does not appear to be taking any of these. She tells me that she has had difficulty getting a primary doctor ABI in our clinic was 1.17 on the left 03/27/2020; patient has a wound on the left anterior lower leg in the setting of chronic lymphedema. The wound margins have been coming in nicely. Objective Constitutional Patient is hypertensive.. Pulse regular and within target range for patient.[name] Respirations regular, non-labored and within target range.. Temperature is normal and within the target range for the patient.[name] Appears in no distress. Vitals Time Taken: 3:11 PM, Height: 66 in, Weight: 327 lbs, BMI: 52.8, Temperature: 97.8 F, Pulse: 102 bpm, Respiratory Rate: 20 breaths/min, Blood Pressure: 164/91 mmHg. Cardiovascular Pedal pulses are palpable on the left. General Notes: Wound exam; nice healthy looking granulation tissue on the F left anterior lower leg. There is some dry flaking skin around this which I removed with wound cleanser and gauze otherwise the surface of this looks very healthy. We have good edema control. 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 Lymphedema starts in the upper lower leg. Integumentary (Hair, Skin) Wound #1 status is Open. Original cause of wound was Gradually Appeared. The wound is located on the Left,Medial Lower Leg. The wound measures 1.2cm length x 0.8cm width x 0.1cm depth; 0.754cm^2 area and 0.075cm^3 volume.  There is Fat Layer (Subcutaneous Tissue) exposed. There is no tunneling or undermining noted. There is a medium amount of serosanguineous drainage noted. The wound margin is distinct with the outline attached to the wound base. There is large (67-100%) red, friable granulation within the wound bed. There is no necrotic tissue within the wound bed. Assessment Active Problems ICD-10 Non-pressure chronic ulcer of other part of left lower leg with fat layer exposed Lymphedema, not elsewhere classified Chronic venous hypertension (idiopathic) with ulcer and inflammation of left lower extremity Procedures Wound #1 Pre-procedure diagnosis of Wound #1 is a Venous Leg Ulcer located on the Left,Medial Lower Leg . There was a Four Layer Compression Therapy Procedure by Rhae Hammock, RN. Post procedure Diagnosis Wound #1: Same as Pre-Procedure Plan Follow-up Appointments: Return Appointment in 1 week. Dressing Change Frequency: Wound #1 Left,Medial Lower Leg: Do not change entire dressing for one week. Skin Barriers/Peri-Wound Care: Moisturizing lotion TCA Cream or Ointment - mix with lotion to lower leg Wound Cleansing: May shower with protection. Primary Wound Dressing: Wound #1 Left,Medial Lower Leg: Calcium Alginate with Silver Secondary Dressing: Wound #1 Left,Medial Lower Leg: ABD pad Edema Control: 4 layer compression: Left lower extremity Avoid standing for long periods of time Elevate legs to the level of the heart or above for 30 minutes daily and/or when sitting, a frequency of: - throughout the day Exercise regularly Support Garment 20-30 mm/Hg pressure to: - to right leg daily Segmental Compressive Device. - lymphadema pumps 60 minutes 2 times per day, call manufacturer to fix pumps 1. Continue with silver alginate ABDs under 4-layer compression that she is tolerating well 2. She has some form of wraparound stocking which she says she has been wearing. She is she relates  that it is old and it may need to  be replaced she will bring it in next week. She is close to International aid/development worker) Signed: 03/27/2020 4:47:57 PM By: Linton Ham MD Signed: 03/27/2020 4:47:57 PM By: Linton Ham MD Entered By: Linton Ham on 03/27/2020 [name]:45:20 -------------------------------------------------------------------------------- SuperBill Details Patient Name: Date of Service: Megan Session D. 03/27/2020 Medical Record Number: [id number] Patient Account Number: [id number] Date of Birth/Sex: Treating RN: [name] (49 y.o. [name], [name] Primary Care Provider: PA Haig Prophet, NO Other Clinician: Referring Provider: Treating Provider/Extender: Cheree Ditto in Treatment: 2 Diagnosis Coding ICD-10 Codes Code Description [phone] Non-pressure chronic ulcer of other part of left lower leg with fat layer exposed I89.0 Lymphedema, not elsewhere classified I87.332 Chronic venous hypertension (idiopathic) with ulcer and inflammation of left lower extremity Facility Procedures CPT4 Code: [id number] Description: (Facility Use Only) [phone] - [facility] L[facility] LT LEG Modifier: Quantity: 1 Physician Procedures : CPT4 Code Description Modifier [id number] - WC PHYS LEVEL 3 - EST PT ICD-10 Diagnosis Description L97.822 Non-pressure chronic ulcer of other part of left lower leg with fat layer exposed I89.0 Lymphedema, not elsewhere classified I87.332 Chronic  venous hypertension (idiopathic) with ulcer and inflammation of left lower extremity Quantity: 1 Electronic Signature(s) Signed: 03/27/2020 4:47:57 PM By: Linton Ham MD Entered By: Linton Ham on 03/27/2020 [name]:45:41

## 2020-03-29 NOTE — Progress Notes (Signed)
KEARY, HANAK ([id number]) Visit Report for 03/27/2020 Arrival Information Details Patient Name: Date of Service: [name], [name] 03/27/2020 2:45 PM Medical Record Number: [id number] Patient Account Number: [id number] Date of Birth/Sex: Treating RN: 1970/06/28 (49 y.o. [name] Primary Care Citlaly Camplin: PA TIENT, NO Other Clinician: Referring Maryn Freelove: Treating Mardy Lucier/Extender: Cheree Ditto in Treatment: 2 Visit Information History Since Last Visit Added or deleted any medications: No Patient Arrived: Ambulatory Any new allergies or adverse reactions: No Arrival Time: 15:11 Had a fall or experienced change in No Accompanied By: self activities of daily living that may affect Transfer Assistance: None risk of falls: Patient Identification Verified: Yes Signs or symptoms of abuse/neglect since last visito No Secondary Verification Process Completed: Yes Hospitalized since last visit: No Patient Requires Transmission-Based Precautions: No Implantable device outside of the clinic excluding No Patient Has Alerts: Yes cellular tissue based products placed in the center Patient Alerts: Patient on Blood Thinner since last visit: Has Dressing in Place as Prescribed: Yes Pain Present Now: No Electronic Signature(s) Signed: 03/29/2020 9:59:16 AM By: Sandre Kitty Entered By: Sandre Kitty on 03/27/2020 15:11:45 -------------------------------------------------------------------------------- Compression Therapy Details Patient Name: Date of Service: [name] Session D. 03/27/2020 2:45 PM Medical Record Number: [id number] Patient Account Number: [id number] Date of Birth/Sex: Treating RN: [birth year] (49 y.o. [name], [name] Primary Care Nubia Ziesmer: PA Haig Prophet, NO Other Clinician: Referring Haroon Shatto: Treating Geovana Gebel/Extender: Cheree Ditto in Treatment: 2 Compression Therapy Performed for Wound Assessment: Wound #1 Left,Medial Lower  Leg Performed By: Clinician Rhae Hammock, RN Compression Type: Four Layer Post Procedure Diagnosis Same as Pre-procedure Electronic Signature(s) Signed: 03/27/2020 5:13:46 PM By: Rhae Hammock RN Entered By: Rhae Hammock on 03/27/2020 15:38:52 -------------------------------------------------------------------------------- Encounter Discharge Information Details Patient Name: Date of Service: [name] Session D. 03/27/2020 2:45 PM Medical Record Number: [id number] Patient Account Number: [id number] Date of Birth/Sex: Treating RN: June 21, 1970 (49 y.o. [name] Primary Care [name]: PA Haig Prophet, [place] Other Clinician: Referring Jachai Okazaki: Treating Graeme Menees/Extender: Cheree Ditto in Treatment: 2 Encounter Discharge Information Items Discharge Condition: Stable Ambulatory Status: Ambulatory Discharge Destination: Home Transportation: Private Auto Accompanied By: self Schedule Follow-up Appointment: Yes Clinical Summary of Care: Electronic Signature(s) Signed: 03/27/2020 5:09:36 PM By: Deon Pilling Entered By: Deon Pilling on 03/27/2020 16:00:46 -------------------------------------------------------------------------------- Lower Extremity Assessment Details Patient Name: Date of Service: [name], NATIVIDAD D. 03/27/2020 2:45 PM Medical Record Number: [id number] Patient Account Number: [id number] Date of Birth/Sex: Treating RN: 1971-02-18 (49 y.o. [name] Primary Care Dare Spillman: PA Haig Prophet, [place] Other Clinician: Referring Jakhia Buxton: Treating Alger Kerstein/Extender: Cheree Ditto in Treatment: 2 Edema Assessment Assessed: [Left: Yes] [Right: No] Edema: [Left: Ye] [Right: s] Calf Left: Right: Point of Measurement: 42 cm From Medial Instep 59.5 cm Ankle Left: Right: Point of Measurement: 11 cm From Medial Instep 23.5 cm Vascular Assessment Pulses: Dorsalis Pedis Palpable: [Left:Yes] Electronic Signature(s) Signed: 03/27/2020 5:09:36 PM  By: Deon Pilling Entered By: Deon Pilling on 03/27/2020 15:14:38 -------------------------------------------------------------------------------- Multi Wound Chart Details Patient Name: Date of Service: [name], Carollyn D. 03/27/2020 2:45 PM Medical Record Number: [id number] Patient Account Number: [id number] Date of Birth/Sex: Treating RN: [birth year] (49 y.o. [name] Primary Care Conall Vangorder: PA Haig Prophet, NO Other Clinician: Referring Miranda Garber: Treating Nafisa Olds/Extender: Cheree Ditto in Treatment: 2 Vital Signs Height(in): 66 Pulse(bpm): 102 Weight(lbs): 327 Blood Pressure(mmHg): 164/91 Body Mass Index(BMI): 53 Temperature(F): 97.8 Respiratory Rate(breaths/min): 20 Photos: [1:No Photos Left, Medial Lower Leg] [N/A:N/A N/A] Wound Location: [1:Gradually Appeared] [N/A:N/A] Wounding Event: [1:Venous  Leg Ulcer] [N/A:N/A] Primary Etiology: [1:Sleep Apnea, Congestive Heart] [N/A:N/A] Comorbid History: [1:Failure, Coronary Artery Disease, Hypertension 10/28/2019] [N/A:N/A] Date Acquired: [1:2] [N/A:N/A] Weeks of Treatment: [1:Open] [N/A:N/A] Wound Status: [1:1.2x0.8x0.1] [N/A:N/A] Measurements L x W x D (cm) [1:0.754] [N/A:N/A] A (cm) : rea [1:0.075] [N/A:N/A] Volume (cm) : [1:92.00%] [N/A:N/A] % Reduction in Area: [1:92.00%] [N/A:N/A] % Reduction in Volume: [1:Full Thickness Without Exposed] [N/A:N/A] Classification: [1:Support Structures Medium] [N/A:N/A] Exudate Amount: [1:Serosanguineous] [N/A:N/A] Exudate Type: [1:red, brown] [N/A:N/A] Exudate Color: [1:Distinct, outline attached] [N/A:N/A] Wound Margin: [1:Large (67-100%)] [N/A:N/A] Granulation Amount: [1:Red, Friable] [N/A:N/A] Granulation Quality: [1:None Present (0%)] [N/A:N/A] Necrotic Amount: [1:Fat Layer (Subcutaneous Tissue): Yes N/A] Exposed Structures: [1:Fascia: No Tendon: No Muscle: No Joint: No Bone: No Large (67-100%)] [N/A:N/A] Epithelialization: [1:Compression Therapy]  [N/A:N/A] Treatment Notes Electronic Signature(s) Signed: 03/27/2020 4:47:57 PM By: Linton Ham MD Signed: 03/28/2020 6:12:47 PM By: Levan Hurst RN, BSN Entered By: Linton Ham on 03/27/2020 15:42:02 -------------------------------------------------------------------------------- Black Details Patient Name: Date of Service: [name] Session D. 03/27/2020 2:45 PM Medical Record Number: [id number] Patient Account Number: [id number] Date of Birth/Sex: Treating RN: Jun 13, 1970 (49 y.o. [name], [name] Primary Care Michiah Masse: PA Haig Prophet, [place] Other Clinician: Referring Iviana Blasingame: Treating Tyreque Finken/Extender: Cheree Ditto in Treatment: 2 Active Inactive Venous Leg Ulcer Nursing Diagnoses: Knowledge deficit related to disease process and management Potential for venous Insuffiency (use before diagnosis confirmed) Goals: Patient will maintain optimal edema control Date Initiated: 03/10/2020 Target Resolution Date: 04/07/2020 Goal Status: Active Interventions: Assess peripheral edema status every visit. Compression as ordered Provide education on venous insufficiency Treatment Activities: Therapeutic compression applied : 03/10/2020 Notes: Wound/Skin Impairment Nursing Diagnoses: Impaired tissue integrity Knowledge deficit related to ulceration/compromised skin integrity Goals: Patient/caregiver will verbalize understanding of skin care regimen Date Initiated: 03/10/2020 Target Resolution Date: 04/07/2020 Goal Status: Active Ulcer/skin breakdown will have a volume reduction of 30% by week 4 Date Initiated: 03/10/2020 Target Resolution Date: 04/07/2020 Goal Status: Active Interventions: Assess patient/caregiver ability to obtain necessary supplies Assess patient/caregiver ability to perform ulcer/skin care regimen upon admission and as needed Assess ulceration(s) every visit Provide education on ulcer and skin care Treatment  Activities: Skin care regimen initiated : 03/10/2020 Topical wound management initiated : 03/10/2020 Notes: Electronic Signature(s) Signed: 03/27/2020 3:34:37 PM By: Rhae Hammock RN Entered By: Rhae Hammock on 03/27/2020 15:34:36 -------------------------------------------------------------------------------- Pain Assessment Details Patient Name: Date of Service: [name], Carsyn D. 03/27/2020 2:45 PM Medical Record Number: [id number] Patient Account Number: [id number] Date of Birth/Sex: Treating RN: [birth year] (49 y.o. [name] Primary Care Tarryn Bogdan: PA Haig Prophet, [place] Other Clinician: Referring Kastiel Simonian: Treating Alanys Godino/Extender: Cheree Ditto in Treatment: 2 Active Problems Location of Pain Severity and Description of Pain Patient Has Paino No Site Locations Pain Management and Medication Current Pain Management: Electronic Signature(s) Signed: 03/28/2020 6:12:47 PM By: Levan Hurst RN, BSN Signed: 03/29/2020 9:59:16 AM By: Sandre Kitty Entered By: Sandre Kitty on 03/27/2020 15:12:09 -------------------------------------------------------------------------------- Patient/Caregiver Education Details Patient Name: Date of Service: [name] Session D. 11/29/2021andnbsp2:45 PM Medical Record Number: [id number] Patient Account Number: [id number] Date of Birth/Gender: Treating RN: 1970-11-21 (49 y.o. [name] Primary Care Physician: PA Haig Prophet, [place] Other Clinician: Referring Physician: Treating Physician/Extender: Cheree Ditto in Treatment: 2 Education Assessment Education Provided To: Patient Education Topics Provided Venous: Methods: Explain/Verbal Responses: Reinforcements needed Electronic Signature(s) Signed: 03/27/2020 5:13:46 PM By: Rhae Hammock RN Entered By: Rhae Hammock on 03/27/2020 15:34:50 -------------------------------------------------------------------------------- Wound Assessment  Details Patient Name: Date of Service: [name], Nanami D. 03/27/2020  2:45 PM Medical Record Number: [id number] Patient Account Number: [id number] Date of Birth/Sex: Treating RN: 1970/08/05 (49 y.o. [name], [name] Primary Care Tiffanie Blassingame: PA TIENT, [place] Other Clinician: Referring Mirko Tailor: Treating Willoughby Doell/Extender: Cheree Ditto in Treatment: 2 Wound Status Wound Number: 1 Primary Venous Leg Ulcer Etiology: Wound Location: Left, Medial Lower Leg Wound Open Wounding Event: Gradually Appeared Status: Date Acquired: 10/28/2019 Comorbid Sleep Apnea, Congestive Heart Failure, Coronary Artery Weeks Of Treatment: 2 History: Disease, Hypertension Clustered Wound: No Wound Measurements Length: (cm) 1.2 Width: (cm) 0.8 Depth: (cm) 0.1 Area: (cm) 0.754 Volume: (cm) 0.075 % Reduction in Area: 92% % Reduction in Volume: 92% Epithelialization: Large (67-100%) Tunneling: No Undermining: No Wound Description Classification: Full Thickness Without Exposed Support Structures Wound Margin: Distinct, outline attached Exudate Amount: Medium Exudate Type: Serosanguineous Exudate Color: red, brown Foul Odor After Cleansing: No Slough/Fibrino No Wound Bed Granulation Amount: Large (67-100%) Exposed Structure Granulation Quality: Red, Friable Fascia Exposed: No Necrotic Amount: None Present (0%) Fat Layer (Subcutaneous Tissue) Exposed: Yes Tendon Exposed: No Muscle Exposed: No Joint Exposed: No Bone Exposed: No Treatment Notes Wound #1 (Left, Medial Lower Leg) 1. Cleanse With Wound Cleanser Soap and water 2. Periwound Care Moisturizing lotion 3. Primary Dressing Applied Calcium Alginate Ag 4. Secondary Dressing ABD Pad 6. Support Layer Applied 4 layer compression wrap Notes stockinette. unna boot first layer applied to secure compression wrap in place. Electronic Signature(s) Signed: 03/27/2020 5:09:36 PM By: Deon Pilling Entered By: Deon Pilling on 03/27/2020  15:15:14 -------------------------------------------------------------------------------- Vitals Details Patient Name: Date of Service: [name], Meelah D. 03/27/2020 2:45 PM Medical Record Number: [id number] Patient Account Number: [id number] Date of Birth/Sex: Treating RN: 07-05-1970 (49 y.o. [name] Primary Care Kyeisha Janowicz: PA TIENT, NO Other Clinician: Referring Sabena Winner: Treating Devanshi Califf/Extender: Cheree Ditto in Treatment: 2 Vital Signs Time Taken: 15:11 Temperature (F): 97.8 Height (in): 66 Pulse (bpm): 102 Weight (lbs): 327 Respiratory Rate (breaths/min): 20 Body Mass Index (BMI): 52.8 Blood Pressure (mmHg): 164/91 Reference Range: 80 - 120 mg / dl Electronic Signature(s) Signed: 03/29/2020 9:59:16 AM By: Sandre Kitty Entered By: Sandre Kitty on 03/27/2020 15:12:03

## 2020-04-03 ENCOUNTER — Encounter (HOSPITAL_BASED_OUTPATIENT_CLINIC_OR_DEPARTMENT_OTHER): Payer: PRIVATE HEALTH INSURANCE | Attending: Internal Medicine | Admitting: Internal Medicine

## 2020-04-03 ENCOUNTER — Other Ambulatory Visit: Payer: Self-pay

## 2020-04-03 DIAGNOSIS — I87332 Chronic venous hypertension (idiopathic) with ulcer and inflammation of left lower extremity: Secondary | ICD-10-CM | POA: Insufficient documentation

## 2020-04-03 DIAGNOSIS — L97822 Non-pressure chronic ulcer of other part of left lower leg with fat layer exposed: Secondary | ICD-10-CM | POA: Diagnosis not present

## 2020-04-03 DIAGNOSIS — I110 Hypertensive heart disease with heart failure: Secondary | ICD-10-CM | POA: Diagnosis not present

## 2020-04-03 DIAGNOSIS — I890 Lymphedema, not elsewhere classified: Secondary | ICD-10-CM | POA: Insufficient documentation

## 2020-04-03 DIAGNOSIS — I2510 Atherosclerotic heart disease of native coronary artery without angina pectoris: Secondary | ICD-10-CM | POA: Insufficient documentation

## 2020-04-03 DIAGNOSIS — Z86718 Personal history of other venous thrombosis and embolism: Secondary | ICD-10-CM | POA: Insufficient documentation

## 2020-04-03 DIAGNOSIS — I252 Old myocardial infarction: Secondary | ICD-10-CM | POA: Diagnosis not present

## 2020-04-03 DIAGNOSIS — Z7901 Long term (current) use of anticoagulants: Secondary | ICD-10-CM | POA: Diagnosis not present

## 2020-04-03 DIAGNOSIS — I509 Heart failure, unspecified: Secondary | ICD-10-CM | POA: Diagnosis not present

## 2020-04-04 NOTE — Progress Notes (Signed)
Megan, [name] ([id number]) Visit Report for 04/03/2020 HPI Details Patient Name: Date of Service: [name], [name] 04/03/2020 2:45 PM Medical Record Number: [id number] Patient Account Number: [id number] Date of Birth/Sex: Treating RN: February 23, 1971 (49 y.o. [name] Primary Care Provider: PA Haig Prophet, NO Other Clinician: Referring Provider: Treating Provider/Extender: Cheree Ditto in Treatment: 3 History of Present Illness HPI Description: ADMISSION 03/10/2020 This is a 49 year old woman who was recently moved to this area from [place] to help look after her mother who is ill. She has a several month history of a wound on the left medial lower leg. She was in the emergency room on 9/23 and 10/12. On both occasions she had x-rays that were negative and she was given doxycycline. On one occasion she had an Haematologist wrap. T the diameter of the wound on 10/12 was 6 cm I do not think a stat large today however her o compression stockings no longer fit because of swelling. Furthermore she tells me she has had external compression pumps but they are not working either. She comes with her last note from her primary doctor in [place] from June. She has a large list of prior diagnoses including iron deficiency anemia, anxiety, asthma, hypertension, coronary artery disease with an MI in 2007, cellulitis in 2019, congestive heart failure, history of a DVT on Eliquis with the advice to stay on this "lifelong", gastric ulcer, chronic blood loss history of an iron infusion given in 2019 with a complete GI work-up negative, osteoarthritis of the left knee, cyst history of a small bowel obstruction, upper GI bleed and prediabetes. She comes in with a long list of medications however the only medications she is taking currently are Eliquis 5 mg twice daily and amlodipine 10 mg daily. She is supposed to be taking atorvastatin, Lasix 80 mg a day, isosorbide mononitrate 30 mg a day, Klor-Con  20 mEq a day but she does not appear to be taking any of these. She tells me that she has had difficulty getting a primary doctor ABI in our clinic was 1.17 on the left 03/27/2020; patient has a wound on the left anterior lower leg in the setting of chronic lymphedema. The wound margins have been coming in nicely. 12/6; left anterior lower leg wound in the setting of chronic lymphedema. Her wound is contracting. She brought her stockings which are some form of external compression garment I have never really seen these but she said she paid $70 for them in [place] about a year ago Engineer, maintenance) Signed: 04/04/2020 9:01:39 AM By: Linton Ham MD Entered By: Linton Ham on 04/03/2020 16:04:57 -------------------------------------------------------------------------------- Physical Exam Details Patient Name: Date of Service: [name], [name] 04/03/2020 2:45 PM Medical Record Number: [id number] Patient Account Number: [id number] Date of Birth/Sex: Treating RN: 1970-09-07 (49 y.o. [name] Primary Care Provider: PA Haig Prophet, NO Other Clinician: Referring Provider: Treating Provider/Extender: Cheree Ditto in Treatment: 3 Constitutional Sitting or standing Blood Pressure is within target range for patient.. Pulse regular and within target range for patient.[name] Respirations regular, non-labored and within target range.. Temperature is normal and within the target range for the patient.[name] Appears in no distress. Cardiovascular Pedal pulses are palpable. Notes Wound exam; the areas on the left anterior tibia. The surface of the wound is certainly smaller. There is some callus around the wound and some dry skin I elected not to debride this. Her edema control is adequate Electronic Signature(s) Signed: 04/04/2020 9:01:39 AM By:  Linton Ham MD Entered By: Linton Ham on 04/03/2020  16:05:55 -------------------------------------------------------------------------------- Physician Orders Details Patient Name: Date of Service: Megan Session D. 04/03/2020 2:45 PM Medical Record Number: [id number] Patient Account Number: [id number] Date of Birth/Sex: Treating RN: 05/20/1970 (49 y.o. [name] Primary Care Provider: PA Haig Prophet, NO Other Clinician: Referring Provider: Treating Provider/Extender: Cheree Ditto in Treatment: 3 Verbal / Phone Orders: No Diagnosis Coding Follow-up Appointments Return Appointment in 1 week. Bathing/ Shower/ Hygiene May shower with protection but do not get wound dressing(s) wet. Edema Control - Lymphedema / SCD / Other Elevate legs to the level of the heart or above for 30 minutes daily and/or when sitting, a frequency of: Avoid standing for long periods of time. Exercise regularly Compression stocking or Garment 30-40 mm/Hg pressure to: - right leg daily Wound Treatment Wound #1 - Lower Leg Wound Laterality: Left, Medial Cleanser: Soap and Water 1 x Per Week/Other:wound center to change weekly Discharge Instructions: May shower and wash wound with dial antibacterial soap and water prior to dressing change. Peri-Wound Care: Sween Lotion (Moisturizing lotion) 1 x Per Week/Other:wound center to change weekly Discharge Instructions: Apply moisturizing lotion as directed Prim Dressing: KerraCel Ag Gelling Fiber Dressing, 2x2 in (silver alginate) 1 x Per Week/Other:wound center to change weekly ary Discharge Instructions: Apply silver alginate to wound bed as instructed Secondary Dressing: Woven Gauze Sponge, Non-Sterile 4x4 in 1 x Per Week/Other:wound center to change weekly Discharge Instructions: Apply over primary dressing as directed. Compression Wrap: FourPress (4 layer compression wrap) 1 x Per Week/Other:wound center to change weekly Discharge Instructions: Apply four layer compression as directed. Electronic  Signature(s) Signed: 04/03/2020 5:36:08 PM By: Levan Hurst RN, BSN Signed: 04/04/2020 9:01:39 AM By: Linton Ham MD Entered By: Levan Hurst on 04/03/2020 15:52:22 -------------------------------------------------------------------------------- Problem List Details Patient Name: Date of Service: Megan Session D. 04/03/2020 2:45 PM Medical Record Number: [id number] Patient Account Number: [id number] Date of Birth/Sex: Treating RN: Jul 13, 1970 (49 y.o. 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 04/03/2020 2:45 PM Medical Record Number: [id number] Patient Account Number: [id number] Date of Birth/Sex: Treating RN: Jul 12, 1970 (49 y.o. [name] Primary Care Provider: PA Haig Prophet, NO Other Clinician: Referring Provider: Treating Provider/Extender: Cheree Ditto in Treatment: 3 Subjective History of Present Illness (HPI) ADMISSION 03/10/2020 This is a 50 year old woman who was recently moved to this area from [place] to help look after her mother who is ill. She has a several month history of a wound on the left medial lower leg. She was in the emergency room on 9/23 and 10/12.  On both occasions she had x-rays that were negative and she was given doxycycline. On one occasion she had an Haematologist wrap. T the diameter of the wound on 10/12 was 6 cm I do not think a stat large today however her o compression stockings no longer fit because of swelling. Furthermore she tells  me she has had external compression pumps but they are not working either. She comes with her last note from her primary doctor in [place] from June. She has a large list of prior diagnoses including iron deficiency anemia, anxiety, asthma, hypertension, coronary artery disease with an MI in 2007, cellulitis in 2019, congestive heart failure, history of a DVT on Eliquis with the advice to stay on this "lifelong", gastric ulcer, chronic blood loss history of an iron infusion given in 2019 with a complete GI work-up negative, osteoarthritis of the left knee, cyst history of a small bowel obstruction, upper GI bleed and prediabetes. She comes in with a long list of medications however the only medications she is taking currently are Eliquis 5 mg twice daily and amlodipine 10 mg daily. She is supposed to be taking atorvastatin, Lasix 80 mg a day, isosorbide mononitrate 30 mg a day, Klor-Con 20 mEq a day but she does not appear to be taking any of these. She tells me that she has had difficulty getting a primary doctor ABI in our clinic was 1.17 on the left 03/27/2020; patient has a wound on the left anterior lower leg in the setting of chronic lymphedema. The wound margins have been coming in nicely. 12/6; left anterior lower leg wound in the setting of chronic lymphedema. Her wound is contracting. She brought her stockings which are some form of external compression garment I have never really seen these but she said she paid $70 for them in [place] about a year ago Objective Constitutional Sitting or standing Blood Pressure is within target range for patient.. Pulse regular and within target range for  patient.[name] Respirations regular, non-labored and within target range.. Temperature is normal and within the target range for the patient.[name] Appears in no distress. Vitals Time Taken: 2:44 PM, Height: 66 in, Weight: 327 lbs, BMI: 52.8, Temperature: 97.6 F, Pulse: 75 bpm, Respiratory Rate: 20 breaths/min, Blood Pressure: 129/82 mmHg. Cardiovascular Pedal pulses are palpable. General Notes: Wound exam; the areas on the left anterior tibia. The surface of the wound is certainly smaller. There is some callus around the wound and some dry skin I elected not to debride this. Her edema control is adequate Integumentary (Hair, Skin) Wound #1 status is Open. Original cause of wound was Gradually Appeared. The wound is located on the Left,Medial Lower Leg. The wound measures 1.2cm length x 0.7cm width x 0.1cm depth; 0.66cm^2 area and 0.066cm^3 volume. There is Fat Layer (Subcutaneous Tissue) exposed. There is no tunneling or undermining noted. There is a medium amount of serosanguineous drainage noted. The wound margin is distinct with the outline attached to the wound base. There is large (67-100%) red, friable granulation within the wound bed. There is a small (1-33%) amount of necrotic tissue within the wound bed including Adherent Slough. Assessment Active Problems ICD-10 Non-pressure chronic ulcer of other part of left lower leg with fat layer exposed Lymphedema, not elsewhere classified Chronic venous hypertension (idiopathic) with ulcer and inflammation of left lower extremity Procedures Wound #1 Pre-procedure diagnosis of Wound #1 is a Venous Leg Ulcer located on the Left,Medial Lower Leg . There was a Four Layer Compression Therapy Procedure by Levan Hurst, RN. Post procedure Diagnosis Wound #1: Same as Pre-Procedure Plan Follow-up Appointments: Return Appointment in 1 week. Bathing/ Shower/ Hygiene: May shower with protection but do not get wound dressing(s) wet. Edema Control -  Lymphedema / SCD / Other: Elevate legs to the level of the heart or above for 30 minutes  daily and/or when sitting, a frequency of: Avoid standing for long periods of time. Exercise regularly Compression stocking or Garment 30-40 mm/Hg pressure to: - right leg daily WOUND #1: - Lower Leg Wound Laterality: Left, Medial Cleanser: Soap and Water 1 x Per Week/Other:wound center to change weekly Discharge Instructions: May shower and wash wound with dial antibacterial soap and water prior to dressing change. Peri-Wound Care: Sween Lotion (Moisturizing lotion) 1 x Per Week/Other:wound center to change weekly Discharge Instructions: Apply moisturizing lotion as directed Prim Dressing: KerraCel Ag Gelling Fiber Dressing, 2x2 in (silver alginate) 1 x Per Week/Other:wound center to change weekly ary Discharge Instructions: Apply silver alginate to wound bed as instructed Secondary Dressing: Woven Gauze Sponge, Non-Sterile 4x4 in 1 x Per Week/Other:wound center to change weekly Discharge Instructions: Apply over primary dressing as directed. Com pression Wrap: FourPress (4 layer compression wrap) 1 x Per Week/Other:wound center to change weekly Discharge Instructions: Apply four layer compression as directed. #1 we continued with the silver alginate under 4-layer compression 2. She has external compression garments which she is using. I talked to her about these being at the end of the useful shelf life although she seemed reluctant to spend in the $70. 3. She should be healed by next week Electronic Signature(s) Signed: 04/04/2020 9:01:39 AM By: Linton Ham MD Entered By: Linton Ham on 04/03/2020 16:07:12 -------------------------------------------------------------------------------- SuperBill Details Patient Name: Date of Service: [name], Chiyeko D. 04/03/2020 Medical Record Number: [id number] Patient Account Number: [id number] Date of Birth/Sex: Treating RN: 03-Feb-1971 (49 y.o. [name] Primary Care Provider: PA TIENT, NO Other Clinician: Referring Provider: Treating Provider/Extender: Cheree Ditto in Treatment: 3 Diagnosis Coding ICD-10 Codes Code Description [phone] Non-pressure chronic ulcer of other part of left lower leg with fat layer exposed I89.0 Lymphedema, not elsewhere classified I87.332 Chronic venous hypertension (idiopathic) with ulcer and inflammation of left lower extremity Facility Procedures CPT4 Code: [id number] Description: (Facility Use Only) [phone] - [place] LWR LT LEG Modifier: Quantity: 1 Physician Procedures : CPT4 Code Description Modifier [id number] - WC PHYS LEVEL 3 - EST PT ICD-10 Diagnosis Description L97.822 Non-pressure chronic ulcer of other part of left lower leg with fat layer exposed I89.0 Lymphedema, not elsewhere classified I87.332 Chronic  venous hypertension (idiopathic) with ulcer and inflammation of left lower extremity Quantity: 1 Electronic Signature(s) Signed: 04/03/2020 5:36:08 PM By: Levan Hurst RN, BSN Signed: 04/04/2020 9:01:39 AM By: Linton Ham MD Entered By: Levan Hurst on 04/03/2020 17:31:44

## 2020-04-04 NOTE — Progress Notes (Signed)
ANH, BIGOS ([id number]) Visit Report for 04/03/2020 Arrival Information Details Patient Name: Date of Service: [name], [name] 04/03/2020 2:45 PM Medical Record Number: [id number] Patient Account Number: [id number] Date of Birth/Sex: Treating RN: Apr 08, 1971 (49 y.o. [name] Primary Care Unknown Schleyer: PA TIENT, NO Other Clinician: Referring Azaria Stegman: Treating Aayan Haskew/Extender: Cheree Ditto in Treatment: 3 Visit Information History Since Last Visit All ordered tests and consults were completed: No Patient Arrived: Ambulatory Added or deleted any medications: No Arrival Time: 14:44 Any new allergies or adverse reactions: No Accompanied By: self Had a fall or experienced change in No Transfer Assistance: None activities of daily living that may affect Patient Identification Verified: Yes risk of falls: Secondary Verification Process Completed: Yes Signs or symptoms of abuse/neglect since last visito No Patient Requires Transmission-Based Precautions: No Hospitalized since last visit: No Patient Has Alerts: Yes Implantable device outside of the clinic excluding No Patient Alerts: Patient on Blood Thinner cellular tissue based products placed in the center since last visit: Has Dressing in Place as Prescribed: Yes Pain Present Now: No Electronic Signature(s) Signed: 04/04/2020 1:53:43 PM By: Sandre Kitty Entered By: Sandre Kitty on 04/03/2020 14:44:38 -------------------------------------------------------------------------------- Compression Therapy Details Patient Name: Date of Service: [name] 04/03/2020 2:45 PM Medical Record Number: [id number] Patient Account Number: [id number] Date of Birth/Sex: Treating RN: 10/30/1970 (49 y.o. [name] Primary Care Mystique Bjelland: PA Haig Prophet, NO Other Clinician: Referring Topher Buenaventura: Treating Darielle Hancher/Extender: Cheree Ditto in Treatment: 3 Compression Therapy Performed for Wound  Assessment: Wound #1 Left,Medial Lower Leg Performed By: Clinician Levan Hurst, RN Compression Type: Four Layer Post Procedure Diagnosis Same as Pre-procedure Electronic Signature(s) Signed: 04/03/2020 5:36:08 PM By: Levan Hurst RN, BSN Entered By: Levan Hurst on 04/03/2020 15:53:16 -------------------------------------------------------------------------------- Encounter Discharge Information Details Patient Name: Date of Service: [name] 04/03/2020 2:45 PM Medical Record Number: [id number] Patient Account Number: [id number] Date of Birth/Sex: Treating RN: 1971-03-04 (49 y.o. [name] Primary Care Kathryne Ramella: PA Haig Prophet, NO Other Clinician: Referring Marshaun Lortie: Treating Jaculin Rasmus/Extender: Cheree Ditto in Treatment: 3 Encounter Discharge Information Items Discharge Condition: Stable Ambulatory Status: Ambulatory Discharge Destination: Home Transportation: Private Auto Accompanied By: self Schedule Follow-up Appointment: Yes Clinical Summary of Care: Patient Declined Electronic Signature(s) Signed: 04/03/2020 4:04:46 PM By: Rhae Hammock RN Entered By: Rhae Hammock on 04/03/2020 16:04:45 -------------------------------------------------------------------------------- Lower Extremity Assessment Details Patient Name: Date of Service: [name] 04/03/2020 2:45 PM Medical Record Number: [id number] Patient Account Number: [id number] Date of Birth/Sex: Treating RN: Aug 31, 1970 (49 y.o. Orvan Falconer Primary Care Crimson Dubberly: PA Haig Prophet, [place] Other Clinician: Referring Shaunte Weissinger: Treating Arisha Gervais/Extender: Cheree Ditto in Treatment: 3 Edema Assessment Assessed: [Left: No] [Right: No] Edema: [Left: Ye] [Right: s] Calf Left: Right: Point of Measurement: 42 cm From Medial Instep 49.5 cm Ankle Left: Right: Point of Measurement: 11 cm From Medial Instep 23.5 cm Electronic Signature(s) Signed: 04/04/2020 5:28:36 PM By:  Carlene Coria RN Entered By: Carlene Coria on 04/03/2020 14:49:56 -------------------------------------------------------------------------------- Multi Wound Chart Details Patient Name: Date of Service: Megan Gilding, [name] 04/03/2020 2:45 PM Medical Record Number: [id number] Patient Account Number: [id number] Date of Birth/Sex: Treating RN: 02-Aug-1970 (49 y.o. [name] Primary Care [name]: PA Haig Prophet, NO Other Clinician: Referring [name]: Treating Lashena Signer/Extender: Cheree Ditto in Treatment: 3 Vital Signs Height(in): 66 Pulse(bpm): 75 Weight(lbs): 327 Blood Pressure(mmHg): 129/82 Body Mass Index(BMI): 53 Temperature(F): 97.6 Respiratory Rate(breaths/min): 20 Photos: [1:No Photos Left, Medial Lower Leg] [N/A:N/A N/A] Wound Location: [  1:Gradually Appeared] [N/A:N/A] Wounding Event: [1:Venous Leg Ulcer] [N/A:N/A] Primary Etiology: [1:Sleep Apnea, Congestive Heart] [N/A:N/A] Comorbid History: [1:Failure, Coronary Artery Disease, Hypertension 10/28/2019] [N/A:N/A] Date Acquired: [1:3] [N/A:N/A] Weeks of Treatment: [1:Open] [N/A:N/A] Wound Status: [1:1.2x0.7x0.1] [N/A:N/A] Measurements L x W x D (cm) [1:0.66] [N/A:N/A] A (cm) : rea [1:0.066] [N/A:N/A] Volume (cm) : [1:93.00%] [N/A:N/A] % Reduction in Area: [1:93.00%] [N/A:N/A] % Reduction in Volume: [1:Full Thickness Without Exposed] [N/A:N/A] Classification: [1:Support Structures Medium] [N/A:N/A] Exudate Amount: [1:Serosanguineous] [N/A:N/A] Exudate Type: [1:red, brown] [N/A:N/A] Exudate Color: [1:Distinct, outline attached] [N/A:N/A] Wound Margin: [1:Large (67-100%)] [N/A:N/A] Granulation Amount: [1:Red, Friable] [N/A:N/A] Granulation Quality: [1:Small (1-33%)] [N/A:N/A] Necrotic Amount: [1:Fat Layer (Subcutaneous Tissue): Yes N/A] Exposed Structures: [1:Fascia: No Tendon: No Muscle: No Joint: No Bone: No Large (67-100%)] [N/A:N/A] Epithelialization: [1:Compression Therapy] [N/A:N/A] Treatment  Notes Electronic Signature(s) Signed: 04/03/2020 5:36:08 PM By: Levan Hurst RN, BSN Signed: 04/04/2020 9:01:39 AM By: Linton Ham MD Entered By: Linton Ham on 04/03/2020 16:02:37 -------------------------------------------------------------------------------- Multi-Disciplinary Care Plan Details Patient Name: Date of Service: [name] 04/03/2020 2:45 PM Medical Record Number: [id number] Patient Account Number: [id number] Date of Birth/Sex: Treating RN: 1970-12-19 (49 y.o. [name] Primary Care Zyann Mabry: PA Haig Prophet, NO Other Clinician: Referring Anastazja Isaac: Treating Pavle Wiler/Extender: Cheree Ditto in Treatment: 3 Active Inactive Venous Leg Ulcer Nursing Diagnoses: Knowledge deficit related to disease process and management Potential for venous Insuffiency (use before diagnosis confirmed) Goals: Patient will maintain optimal edema control Date Initiated: 03/10/2020 Target Resolution Date: 04/14/2020 Goal Status: Active Interventions: Assess peripheral edema status every visit. Compression as ordered Provide education on venous insufficiency Treatment Activities: Therapeutic compression applied : 03/10/2020 Notes: Wound/Skin Impairment Nursing Diagnoses: Impaired tissue integrity Knowledge deficit related to ulceration/compromised skin integrity Goals: Patient/caregiver will verbalize understanding of skin care regimen Date Initiated: 03/10/2020 Target Resolution Date: 04/14/2020 Goal Status: Active Ulcer/skin breakdown will have a volume reduction of 30% by week 4 Date Initiated: 03/10/2020 Target Resolution Date: 04/14/2020 Goal Status: Active Interventions: Assess patient/caregiver ability to obtain necessary supplies Assess patient/caregiver ability to perform ulcer/skin care regimen upon admission and as needed Assess ulceration(s) every visit Provide education on ulcer and skin care Treatment Activities: Skin care regimen  initiated : 03/10/2020 Topical wound management initiated : 03/10/2020 Notes: Electronic Signature(s) Signed: 04/03/2020 5:36:08 PM By: Levan Hurst RN, BSN Entered By: Levan Hurst on 04/03/2020 17:31:16 -------------------------------------------------------------------------------- Pain Assessment Details Patient Name: Date of Service: Megan Gilding, Cayden D. 04/03/2020 2:45 PM Medical Record Number: [id number] Patient Account Number: [id number] Date of Birth/Sex: Treating RN: 27-Feb-1971 (49 y.o. [name] Primary Care Malcom Selmer: PA Haig Prophet, NO Other Clinician: Referring Lutie Pickler: Treating Burrell Hodapp/Extender: Cheree Ditto in Treatment: 3 Active Problems Location of Pain Severity and Description of Pain Patient Has Paino No Site Locations Pain Management and Medication Current Pain Management: Electronic Signature(s) Signed: 04/03/2020 5:36:08 PM By: Levan Hurst RN, BSN Signed: 04/04/2020 1:53:43 PM By: Sandre Kitty Entered By: Sandre Kitty on 04/03/2020 14:45:07 -------------------------------------------------------------------------------- Patient/Caregiver Education Details Patient Name: Date of Service: [name] 12/6/2021andnbsp2:45 PM Medical Record Number: [id number] Patient Account Number: [id number] Date of Birth/Gender: Treating RN: [birth year] (49 y.o. [name] Primary Care Physician: PA Haig Prophet, [place] Other Clinician: Referring Physician: Treating Physician/Extender: Cheree Ditto in Treatment: 3 Education Assessment Education Provided To: Patient Education Topics Provided Wound/Skin Impairment: Methods: Explain/Verbal Responses: State content correctly Electronic Signature(s) Signed: 04/03/2020 5:36:08 PM By: Levan Hurst RN, BSN Entered By: Levan Hurst on 04/03/2020 17:31:28 -------------------------------------------------------------------------------- Wound Assessment Details Patient Name: Date  of  Service: ANIHYA, TUMA D. 04/03/2020 2:45 PM Medical Record Number: [id number] Patient Account Number: [id number] Date of Birth/Sex: Treating RN: [birth year] (49 y.o. Orvan Falconer Primary Care Dhruva Orndoff: PA Haig Prophet, [place] Other Clinician: Referring Nethaniel Mattie: Treating Rayan Dyal/Extender: Cheree Ditto in Treatment: 3 Wound Status Wound Number: 1 Primary Venous Leg Ulcer Etiology: Wound Location: Left, Medial Lower Leg Wound Open Wounding Event: Gradually Appeared Status: Date Acquired: 10/28/2019 Comorbid Sleep Apnea, Congestive Heart Failure, Coronary Artery Weeks Of Treatment: 3 History: Disease, Hypertension Clustered Wound: No Wound Measurements Length: (cm) 1.2 Width: (cm) 0.7 Depth: (cm) 0.1 Area: (cm) 0.66 Volume: (cm) 0.066 % Reduction in Area: 93% % Reduction in Volume: 93% Epithelialization: Large (67-100%) Tunneling: No Undermining: No Wound Description Classification: Full Thickness Without Exposed Support Structures Wound Margin: Distinct, outline attached Exudate Amount: Medium Exudate Type: Serosanguineous Exudate Color: red, brown Foul Odor After Cleansing: No Slough/Fibrino No Wound Bed Granulation Amount: Large (67-100%) Exposed Structure Granulation Quality: Red, Friable Fascia Exposed: No Necrotic Amount: Small (1-33%) Fat Layer (Subcutaneous Tissue) Exposed: Yes Necrotic Quality: Adherent Slough Tendon Exposed: No Muscle Exposed: No Joint Exposed: No Bone Exposed: No Treatment Notes Wound #1 (Lower Leg) Wound Laterality: Left, Medial Cleanser Soap and Water Discharge Instruction: May shower and wash wound with dial antibacterial soap and water prior to dressing change. Peri-Wound Care Sween Lotion (Moisturizing lotion) Discharge Instruction: Apply moisturizing lotion as directed Topical Primary Dressing KerraCel Ag Gelling Fiber Dressing, 2x2 in (silver alginate) Discharge Instruction: Apply silver alginate to wound bed as  instructed Secondary Dressing Woven Gauze Sponge, Non-Sterile 4x4 in Discharge Instruction: Apply over primary dressing as directed. Secured With Compression Wrap FourPress (4 layer compression wrap) Discharge Instruction: Apply four layer compression as directed. Compression Stockings Add-Ons Electronic Signature(s) Signed: 04/04/2020 5:28:36 PM By: Carlene Coria RN Entered By: Carlene Coria on 04/03/2020 14:50:50 -------------------------------------------------------------------------------- Vitals Details Patient Name: Date of Service: Megan Gilding, Katerin D. 04/03/2020 2:45 PM Medical Record Number: [id number] Patient Account Number: [id number] Date of Birth/Sex: Treating RN: 08/27/1970 (49 y.o. [name] Primary Care Lieutenant Abarca: PA TIENT, NO Other Clinician: Referring Nalaya Wojdyla: Treating Aldin Drees/Extender: Cheree Ditto in Treatment: 3 Vital Signs Time Taken: 14:44 Temperature (F): 97.6 Height (in): 66 Pulse (bpm): 75 Weight (lbs): 327 Respiratory Rate (breaths/min): 20 Body Mass Index (BMI): 52.8 Blood Pressure (mmHg): 129/82 Reference Range: 80 - 120 mg / dl Electronic Signature(s) Signed: 04/04/2020 1:53:43 PM By: Sandre Kitty Entered By: Sandre Kitty on 04/03/2020 14:44:58

## 2020-04-07 ENCOUNTER — Ambulatory Visit: Payer: Medicaid Other | Attending: Internal Medicine | Admitting: Internal Medicine

## 2020-04-07 ENCOUNTER — Other Ambulatory Visit: Payer: Self-pay

## 2020-04-07 ENCOUNTER — Encounter: Payer: Self-pay | Admitting: Internal Medicine

## 2020-04-07 VITALS — BP 160/100 | HR 86 | Temp 95.9°F | Resp 16 | Ht 66.0 in | Wt 333.8 lb

## 2020-04-07 DIAGNOSIS — R7303 Prediabetes: Secondary | ICD-10-CM | POA: Diagnosis not present

## 2020-04-07 DIAGNOSIS — I2510 Atherosclerotic heart disease of native coronary artery without angina pectoris: Secondary | ICD-10-CM | POA: Diagnosis not present

## 2020-04-07 DIAGNOSIS — Z7689 Persons encountering health services in other specified circumstances: Secondary | ICD-10-CM

## 2020-04-07 DIAGNOSIS — Z86718 Personal history of other venous thrombosis and embolism: Secondary | ICD-10-CM

## 2020-04-07 DIAGNOSIS — N939 Abnormal uterine and vaginal bleeding, unspecified: Secondary | ICD-10-CM

## 2020-04-07 DIAGNOSIS — Z23 Encounter for immunization: Secondary | ICD-10-CM | POA: Diagnosis not present

## 2020-04-07 DIAGNOSIS — F411 Generalized anxiety disorder: Secondary | ICD-10-CM

## 2020-04-07 DIAGNOSIS — Z8709 Personal history of other diseases of the respiratory system: Secondary | ICD-10-CM

## 2020-04-07 DIAGNOSIS — I10 Essential (primary) hypertension: Secondary | ICD-10-CM | POA: Diagnosis not present

## 2020-04-07 DIAGNOSIS — L97929 Non-pressure chronic ulcer of unspecified part of left lower leg with unspecified severity: Secondary | ICD-10-CM

## 2020-04-07 DIAGNOSIS — Z1231 Encounter for screening mammogram for malignant neoplasm of breast: Secondary | ICD-10-CM

## 2020-04-07 DIAGNOSIS — I5032 Chronic diastolic (congestive) heart failure: Secondary | ICD-10-CM | POA: Diagnosis not present

## 2020-04-07 DIAGNOSIS — D500 Iron deficiency anemia secondary to blood loss (chronic): Secondary | ICD-10-CM

## 2020-04-07 LAB — POCT GLYCOSYLATED HEMOGLOBIN (HGB A1C): HbA1c, POC (prediabetic range): 5.7 % (ref 5.7–6.4)

## 2020-04-07 LAB — GLUCOSE, POCT (MANUAL RESULT ENTRY): POC Glucose: 102 mg/dl — AB (ref 70–99)

## 2020-04-07 MED ORDER — VITAMIN D 25 MCG (1000 UNIT) PO TABS: 1000.0000 [IU] | ORAL_TABLET | Freq: Every day | ORAL | 5 refills | Status: DC

## 2020-04-07 MED ORDER — FUROSEMIDE 80 MG PO TABS: 80.0000 mg | ORAL_TABLET | Freq: Every day | ORAL | 3 refills | Status: DC

## 2020-04-07 MED ORDER — APIXABAN 5 MG PO TABS: 5.0000 mg | ORAL_TABLET | Freq: Two times a day (BID) | ORAL | 3 refills | Status: DC

## 2020-04-07 MED ORDER — POTASSIUM CHLORIDE CRYS ER 20 MEQ PO TBCR: 20.0000 meq | EXTENDED_RELEASE_TABLET | Freq: Every day | ORAL | 6 refills | Status: DC

## 2020-04-07 MED ORDER — ALBUTEROL SULFATE HFA 108 (90 BASE) MCG/ACT IN AERS: 2.0000 | INHALATION_SPRAY | Freq: Four times a day (QID) | RESPIRATORY_TRACT | 2 refills | Status: DC | PRN

## 2020-04-07 MED ORDER — CARVEDILOL 25 MG PO TABS: 25.0000 mg | ORAL_TABLET | Freq: Two times a day (BID) | ORAL | 6 refills | Status: DC

## 2020-04-07 MED ORDER — SPIRONOLACTONE 25 MG PO TABS: 25.0000 mg | ORAL_TABLET | Freq: Every day | ORAL | 6 refills | Status: DC

## 2020-04-07 MED ORDER — PAROXETINE HCL 10 MG PO TABS: 10.0000 mg | ORAL_TABLET | Freq: Every day | ORAL | 6 refills | Status: DC

## 2020-04-07 MED ORDER — AMLODIPINE BESYLATE 5 MG PO TABS: 2.5000 mg | ORAL_TABLET | Freq: Every day | ORAL | 6 refills | Status: DC

## 2020-04-07 MED ORDER — ATORVASTATIN CALCIUM 40 MG PO TABS: 40.0000 mg | ORAL_TABLET | Freq: Every day | ORAL | 6 refills | Status: DC

## 2020-04-07 NOTE — Progress Notes (Signed)
cbg- [id number]-[id number]

## 2020-04-07 NOTE — Progress Notes (Signed)
Patient ID: [name], female    DOB: Jul 22, 1970  MRN: [id number]  CC: New Patient (Initial Visit) and Fatigue   Subjective: [name] is a 49 y.o. female who presents for  Est care. 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  EGD and colonoscopy negative, chronic ulcer left lower extremity, asthma  Previous PCP was Dr. Genevive Bi in [place], [place].  Pt relocated to [facility] 11/2018.  Moved to be closer to family.   She brings med bottles with her.  Out of all meds x 4 mths  HTN/CAD/diastolic CHF: Denies chest pain, shortness of breath.  Some lower extremity edema left greater than right.  Sleeps on 3 pillows.  Endorses some PND.  She is supposed to be on spironolactone, carvedilol, furosemide, amlodipine.  Ulcer left lower leg: Patient reports this has been present since August but has had recurrent episodes that come and go even prior to that.  Currently being followed by wound care clinic.  She goes 2 times a week to get new dressing placed.  She reports that the ulcer is healing.  Iron deficiency anemia: She is on Eliquis for history of recurrent DVT.  She has IVC filter in place.   -She has history of chronic blood loss.  She brings note from her last visit with her PCP in [place] with her.  According to this note she was seeing hematology every 2 weeks for iron infusion.  She had a bone marrow biopsy the results of which was not stated in this note.  She has a Port-A-Cath in place which was used for iron transfusions.  She reports having had 17 blood transfusions in the past 3 years. -She also gives history of gastric ulcer and reports having had EGD and colonoscopy along with capsule endoscopy in [place].   Reports capsule endoscopy was negative.. -Recent EGD and colonoscopy done on admission through [facility] in August of this year were negative. -She denies any blood in the stools or black stools.  No abdominal pain.  Asthma: No recent flares.  Uses albuterol inhaler 2 puffs daily.  She does not smoke.  She quit back in 2005.  Morbid obesity/prediabetes: She states she was evaluated in the past for possible weight reduction surgery but was told she would be too high risk.  She has been trying to cut back on portion sizes of her food.  She tries to do a little walking daily.  anxiety: She was on Paxil 20 mg daily and BuSpar.  She has been off both for several months.  She feels that she needs to be back on at least the Paxil.  HM: Reports having had the flu shot on the 23rd of last month through [facility].  She has completed the [place] COVID-19 vaccine.  She is due for mammogram and Pap smear.  She reports some intermittent vaginal bleeding the last of which occurred in June.  However she tells me that she has had bilateral oophorectomy for cysts on the ovaries.   Past medical, surgical, family history and social history reviewed.  Current Outpatient Medications on File Prior to Visit  Medication Sig Dispense Refill  . busPIRone (BUSPAR) 15 MG tablet Take 15 mg by mouth daily.    . fluticasone (  FLOVENT HFA) 110 MCG/ACT inhaler Inhale 2 puffs into the lungs daily.    [name] omeprazole (PRILOSEC) 20 MG capsule Take 20 mg by mouth daily.    . vitamin B-12 (CYANOCOBALAMIN) 1000 MCG tablet Take 1,000 mcg by mouth daily.     No current facility-administered medications on file prior to visit.    Allergies  Allergen Reactions  . Ace Inhibitors Rash and Other (See Comments)    Make pt bleed  . Hydromorphone Hives and Itching  . Vancomycin Itching and Rash  . Iron     Childhood     Social History   Socioeconomic History  . Marital status: Single    Spouse name: Not on file  . Number of  children: 0  . Years of education: Not on file  . Highest education level: 12th grade  Occupational History  . Occupation: unemployed on [place] Use  . Smoking status: Former Research scientist (life sciences)  . Smokeless tobacco: Never Used  Vaping Use  . Vaping Use: Never used  Substance and Sexual Activity  . Alcohol use: Not Currently  . Drug use: Not Currently  . Sexual activity: Not Currently  Other Topics Concern  . Not on file  Social History Narrative  . Not on file   Social Determinants of Health   Financial Resource Strain: Not on file  Food Insecurity: Not on file  Transportation Needs: Not on file  Physical Activity: Not on file  Stress: Not on file  Social Connections: Not on file  Intimate Partner Violence: Not on file    Family History  Problem Relation Age of Onset  . Diabetes Mother   . COPD Father   . Diabetes Maternal Grandmother   . Breast cancer Paternal Grandfather     Past Surgical History:  Procedure Laterality Date  . BILATERAL OOPHORECTOMY      ROS: Review of Systems Negative except as stated above  PHYSICAL EXAM: BP (!) 160/100   Pulse 86   Temp (!) 95.9 F (35.5 C)   Resp 16   Ht 5' 6" (1.676 m)   Wt (!) 333 lb 12.8 oz (151.4 kg)   SpO2 97%   BMI 53.88 kg/m   This SmartLink has not been configured with any valid records.   BP 160/100 Physical Exam  General appearance - alert, well appearing, morbidly obese middle-age African-American female and in no distress Mental status -patient has an odd affect.  I question the reliability of some of her answers as she changes answers to some of the questions.   Eyes - pupils equal and reactive, extraocular eye movements intact Nose - normal and patent, no erythema, discharge or polyps Mouth - mucous membranes moist, pharynx normal without lesions Neck - supple, no significant adenopathy Chest - clear to auscultation, no wheezes, rales or rhonchi, symmetric air entry Heart - normal rate, regular  rhythm, normal S1, S2, no murmurs, rubs, clicks or gallops Extremities -left lower extremity is wrapped from about the mid calf down.  Trace edema in the right lower extremity.  CMP Latest Ref Rng & Units 01/20/2020  Glucose 70 - 99 mg/dL 98  BUN 6 - 20 mg/dL 9  Creatinine 0.44 - 1.00 mg/dL 0.75  Sodium 135 - 145 mmol/L 140  Potassium 3.5 - 5.1 mmol/L 3.6  Chloride 98 - 111 mmol/L 103  CO2 22 - 32 mmol/L 28  Calcium 8.9 - 10.3 mg/dL 9.3  Total Protein 6.5 - 8.1 g/dL 7.5  Total Bilirubin 0.3 -  1.2 mg/dL 1.3(H)  Alkaline Phos 38 - 126 U/L 74  AST 15 - 41 U/L 16  ALT 0 - 44 U/L 11   Lipid Panel  No results found for: CHOL, TRIG, HDL, CHOLHDL, VLDL, LDLCALC, LDLDIRECT  CBC    Component Value Date/Time   WBC 5.0 02/08/2020 1850   RBC 3.80 (L) 02/08/2020 1850   HGB 10.1 (L) 02/08/2020 1850   HCT 35.3 (L) 02/08/2020 1850   PLT 148 (L) 02/08/2020 1850   MCV 92.9 02/08/2020 1850   MCH 26.6 02/08/2020 1850   MCHC 28.6 (L) 02/08/2020 1850   RDW 16.2 (H) 02/08/2020 1850   LYMPHSABS 0.7 02/08/2020 1850   MONOABS 0.5 02/08/2020 1850   EOSABS 0.1 02/08/2020 1850   BASOSABS 0.0 02/08/2020 1850   GAD 7 : Generalized Anxiety Score 04/07/2020  Nervous, Anxious, on Edge 1  Control/stop worrying 1  Worry too much - different things 1  Trouble relaxing 0  Restless 0  Easily annoyed or irritable 0  Afraid - awful might happen 0  Total GAD 7 Score 3    Depression screen PHQ 2/9 04/07/2020  Decreased Interest 0  Down, Depressed, Hopeless 0  PHQ - 2 Score 0    ASSESSMENT AND PLAN: 1. Establishing care with new doctor, encounter for  2. Essential hypertension Not at goal.  She has been out of her medications.  Refills given today.  DASH diet discussed and encouraged - amLODipine (NORVASC) 5 MG tablet; Take 0.5 tablets (2.5 mg total) by mouth daily.  Dispense: 15 tablet; Refill: 6 - carvedilol (COREG) 25 MG tablet; Take 1 tablet (25 mg total) by mouth 2 (two) times daily with a meal.   Dispense: 60 tablet; Refill: 6 - furosemide (LASIX) 80 MG tablet; Take 1 tablet (80 mg total) by mouth daily.  Dispense: 30 tablet; Refill: 3 - CBC - Comprehensive metabolic panel  3. Diastolic CHF, chronic ([place]) 4. Coronary artery disease involving native coronary artery of native heart without angina pectoris We will get her plugged in with a cardiologist.  Low-salt diet encouraged.  Refill given on spironolactone, carvedilol and furosemide.  Have also refilled potassium supplement.  After she has been on her medications for 1 week, I have requested that she return to the lab to have blood test done including chemistry. - spironolactone (ALDACTONE) 25 MG tablet; Take 1 tablet (25 mg total) by mouth daily.  Dispense: 30 tablet; Refill: 6 - carvedilol (COREG) 25 MG tablet; Take 1 tablet (25 mg total) by mouth 2 (two) times daily with a meal.  Dispense: 60 tablet; Refill: 6 - furosemide (LASIX) 80 MG tablet; Take 1 tablet (80 mg total) by mouth daily.  Dispense: 30 tablet; Refill: 3 - atorvastatin (LIPITOR) 40 MG tablet; Take 1 tablet (40 mg total) by mouth daily.  Dispense: 30 tablet; Refill: 6 - Lipid panel  5. Morbid obesity ([place]) I spent a little time doing some dietary counseling.  Encouraged her to eliminate sugary drinks from the diet, eat more white meat instead of red meat, cut back on portion sizes of white carbohydrates and incorporate fresh fruits and vegetables into the diet.  She tells me that she was evaluated in the past for weight reduction surgery and was deemed to be at too high risk given her chronic medical issues.  She is willing however to participate in our medical weight management program.  Referral given - Amb Ref to Medical Weight Management  6. Prediabetes See #5 above. - POCT  glucose (manual entry) - POCT glycosylated hemoglobin (Hb A1C) - Microalbumin / creatinine urine ratio  7. Iron deficiency anemia due to chronic blood loss Recent EGD and endoscopy negative.   She was restarted on Eliquis post hospital in August through [facility].  She gives past history of gastric ulcer.  She still has a Port-A-Cath in place through which she used to receive iron transfusions every 2 weeks.  I will get her plugged in with the hematologist here.   - CBC - Iron, TIBC and Ferritin Panel - Ambulatory referral to Hematology  8. Leg ulcer, left, with unspecified severity ([place]) Being followed by wound care and is healing per patient report and wound care note  9. History of DVT (deep vein thrombosis) - apixaban (ELIQUIS) 5 MG TABS tablet; Take 1 tablet (5 mg total) by mouth 2 (two) times daily.  Dispense: 60 tablet; Refill: 3 - Ambulatory referral to Hematology  10. Generalized anxiety disorder We will restart Paxil but at a lower dose given that she has been off of it for several months. - PARoxetine (PAXIL) 10 MG tablet; Take 1 tablet (10 mg total) by mouth daily.  Dispense: 30 tablet; Refill: 6  11. Vaginal bleeding This is surprising given that she reports having had bilateral oophorectomy  - Ambulatory referral to Gynecology  12. History of asthma - albuterol (VENTOLIN HFA) 108 (90 Base) MCG/ACT inhaler; Inhale 2 puffs into the lungs every 6 (six) hours as needed for wheezing or shortness of breath.  Dispense: 8 g; Refill: 2  13. Encounter for screening mammogram for malignant neoplasm of breast MMG referral  14. Need for shingles vaccine Given 1st shot of Shingrix     Patient was given the opportunity to ask questions.  Patient verbalized understanding of the plan and was able to repeat key elements of the plan.   Orders Placed This Encounter  Procedures  . Varicella-zoster vaccine IM  . Microalbumin / creatinine urine ratio  . CBC  . Comprehensive metabolic panel  . Lipid panel  . Iron, TIBC and Ferritin Panel  . Ambulatory referral to Hematology  . Ambulatory referral to Gynecology  . Amb Ref to Medical Weight Management  . POCT glucose  (manual entry)  . POCT glycosylated hemoglobin (Hb A1C)     Requested Prescriptions   Signed Prescriptions Disp Refills  . amLODipine (NORVASC) 5 MG tablet 15 tablet 6    Sig: Take 0.5 tablets (2.5 mg total) by mouth daily.  . cholecalciferol (VITAMIN D3) 25 MCG (1000 UNIT) tablet 30 tablet 5    Sig: Take 1 tablet (1,000 Units total) by mouth daily.  [name] atorvastatin (LIPITOR) 40 MG tablet 30 tablet 6    Sig: Take 1 tablet (40 mg total) by mouth daily.  [name] spironolactone (ALDACTONE) 25 MG tablet 30 tablet 6    Sig: Take 1 tablet (25 mg total) by mouth daily.  [name] apixaban (ELIQUIS) 5 MG TABS tablet 60 tablet 3    Sig: Take 1 tablet (5 mg total) by mouth 2 (two) times daily.  . carvedilol (COREG) 25 MG tablet 60 tablet 6    Sig: Take 1 tablet (25 mg total) by mouth 2 (two) times daily with a meal.  . furosemide (LASIX) 80 MG tablet 30 tablet 3    Sig: Take 1 tablet (80 mg total) by mouth daily.  [name] PARoxetine (PAXIL) 10 MG tablet 30 tablet 6    Sig: Take 1 tablet (10 mg total) by mouth daily.  [name]  albuterol (VENTOLIN HFA) 108 (90 Base) MCG/ACT inhaler 8 g 2    Sig: Inhale 2 puffs into the lungs every 6 (six) hours as needed for wheezing or shortness of breath.  . potassium chloride SA (KLOR-CON) 20 MEQ tablet 30 tablet 6    Sig: Take 1 tablet (20 mEq total) by mouth daily.    Return in about 2 months (around 06/08/2020).  [name], MD, FACP

## 2020-04-07 NOTE — Patient Instructions (Addendum)
After you have been back on the medications for at least 1 week.  Please return to the lab to have your blood test done that were ordered today.  You have been referred to a hematologist, gynecologist and for medical weight management.   Healthy Eating Following a healthy eating pattern may help you to achieve and maintain a healthy body weight, reduce the risk of chronic disease, and live a long and productive life. It is important to follow a healthy eating pattern at an appropriate calorie level for your body. Your nutritional needs should be met primarily through food by choosing a variety of nutrient-rich foods. What are tips for following this plan? Reading food labels  Read labels and choose the following: ? Reduced or low sodium. ? Juices with 100% fruit juice. ? Foods with low saturated fats and high polyunsaturated and monounsaturated fats. ? Foods with whole grains, such as whole wheat, cracked wheat, brown rice, and wild rice. ? Whole grains that are fortified with folic acid. This is recommended for women who are pregnant or who want to become pregnant.  Read labels and avoid the following: ? Foods with a lot of added sugars. These include foods that contain brown sugar, corn sweetener, corn syrup, dextrose, fructose, glucose, high-fructose corn syrup, honey, invert sugar, lactose, malt syrup, maltose, molasses, raw sugar, sucrose, trehalose, or turbinado sugar.  Do not eat more than the following amounts of added sugar per day:  6 teaspoons (25 g) for women.  9 teaspoons (38 g) for men. ? Foods that contain processed or refined starches and grains. ? Refined grain products, such as white flour, degermed cornmeal, white bread, and white rice. Shopping  Choose nutrient-rich snacks, such as vegetables, whole fruits, and nuts. Avoid high-calorie and high-sugar snacks, such as potato chips, fruit snacks, and candy.  Use oil-based dressings and spreads on foods instead of solid  fats such as butter, stick margarine, or cream cheese.  Limit pre-made sauces, mixes, and "instant" products such as flavored rice, instant noodles, and ready-made pasta.  Try more plant-protein sources, such as tofu, tempeh, black beans, edamame, lentils, nuts, and seeds.  Explore eating plans such as the Mediterranean diet or vegetarian diet. Cooking  Use oil to saut or stir-fry foods instead of solid fats such as butter, stick margarine, or lard.  Try baking, boiling, grilling, or broiling instead of frying.  Remove the fatty part of meats before cooking.  Steam vegetables in water or broth. Meal planning   At meals, imagine dividing your plate into fourths: ? One-half of your plate is fruits and vegetables. ? One-fourth of your plate is whole grains. ? One-fourth of your plate is protein, especially lean meats, poultry, eggs, tofu, beans, or nuts.  Include low-fat dairy as part of your daily diet. Lifestyle  Choose healthy options in all settings, including home, work, school, restaurants, or stores.  Prepare your food safely: ? Wash your hands after handling raw meats. ? Keep food preparation surfaces clean by regularly washing with hot, soapy water. ? Keep raw meats separate from ready-to-eat foods, such as fruits and vegetables. ? Cook seafood, meat, poultry, and eggs to the recommended internal temperature. ? Store foods at safe temperatures. In general:  Keep cold foods at 38F (4.4C) or below.  Keep hot foods at 138F (60C) or above.  Keep your freezer at [facility] (-17.8C) or below.  Foods are no longer safe to eat when they have been between the temperatures of 40-138F (4.4-60C) for  more than 2 hours. What foods should I eat? Fruits Aim to eat 2 cup-equivalents of fresh, canned (in natural juice), or frozen fruits each day. Examples of 1 cup-equivalent of fruit include 1 small apple, 8 large strawberries, 1 cup canned fruit,  cup dried fruit, or 1 cup 100%  juice. Vegetables Aim to eat 2-3 cup-equivalents of fresh and frozen vegetables each day, including different varieties and colors. Examples of 1 cup-equivalent of vegetables include 2 medium carrots, 2 cups raw, leafy greens, 1 cup chopped vegetable (raw or cooked), or 1 medium baked potato. Grains Aim to eat 6 ounce-equivalents of whole grains each day. Examples of 1 ounce-equivalent of grains include 1 slice of bread, 1 cup ready-to-eat cereal, 3 cups popcorn, or  cup cooked rice, pasta, or cereal. Meats and other proteins Aim to eat 5-6 ounce-equivalents of protein each day. Examples of 1 ounce-equivalent of protein include 1 egg, 1/2 cup nuts or seeds, or 1 tablespoon (16 g) peanut butter. A cut of meat or fish that is the size of a deck of cards is about 3-4 ounce-equivalents.  Of the protein you eat each week, try to have at least 8 ounces come from seafood. This includes salmon, trout, herring, and anchovies. Dairy Aim to eat 3 cup-equivalents of fat-free or low-fat dairy each day. Examples of 1 cup-equivalent of dairy include 1 cup (240 mL) milk, 8 ounces (250 g) yogurt, 1 ounces (44 g) natural cheese, or 1 cup (240 mL) fortified soy milk. Fats and oils  Aim for about 5 teaspoons (21 g) per day. Choose monounsaturated fats, such as canola and olive oils, avocados, peanut butter, and most nuts, or polyunsaturated fats, such as sunflower, corn, and soybean oils, walnuts, pine nuts, sesame seeds, sunflower seeds, and flaxseed. Beverages  Aim for six 8-oz glasses of water per day. Limit coffee to three to five 8-oz cups per day.  Limit caffeinated beverages that have added calories, such as soda and energy drinks.  Limit alcohol intake to no more than 1 drink a day for nonpregnant women and 2 drinks a day for men. One drink equals 12 oz of beer (355 mL), 5 oz of wine (148 mL), or 1 oz of hard liquor (44 mL). Seasoning and other foods  Avoid adding excess amounts of salt to your foods.  Try flavoring foods with herbs and spices instead of salt.  Avoid adding sugar to foods.  Try using oil-based dressings, sauces, and spreads instead of solid fats. This information is based on general [place] nutrition guidelines. For more information, visit Build[employer].es. Exact amounts may vary based on your nutrition needs. Summary  A healthy eating plan may help you to maintain a healthy weight, reduce the risk of chronic diseases, and stay active throughout your life.  Plan your meals. Make sure you eat the right portions of a variety of nutrient-rich foods.  Try baking, boiling, grilling, or broiling instead of frying.  Choose healthy options in all settings, including home, work, school, restaurants, or stores. This information is not intended to replace advice given to you by your health care provider. Make sure you discuss any questions you have with your health care provider. Document Revised: 07/28/2017 Document Reviewed: 07/28/2017 Elsevier Patient Education  [place].  Zoster Vaccine, Recombinant injection What is this medicine? ZOSTER VACCINE (ZOS ter vak SEEN) is used to prevent shingles in adults 51 years old and over. This vaccine is not used to treat shingles or nerve pain from shingles. This  medicine may be used for other purposes; ask your health care provider or pharmacist if you have questions. COMMON BRAND NAME(S): [facility] What should I tell my health care provider before I take this medicine? They need to know if you have any of these conditions:  blood disorders or disease  cancer like leukemia or lymphoma  immune system problems or therapy  an unusual or allergic reaction to vaccines, other medications, foods, dyes, or preservatives  pregnant or trying to get pregnant  breast-feeding How should I use this medicine? This vaccine is for injection in a muscle. It is given by a health care professional. Talk to your pediatrician regarding the  use of this medicine in children. This medicine is not approved for use in children. Overdosage: If you think you have taken too much of this medicine contact a poison control center or emergency room at once. NOTE: This medicine is only for you. Do not share this medicine with others. What if I miss a dose? Keep appointments for follow-up (booster) doses as directed. It is important not to miss your dose. Call your doctor or health care professional if you are unable to keep an appointment. What may interact with this medicine?  medicines that suppress your immune system  medicines to treat cancer  steroid medicines like prednisone or cortisone This list may not describe all possible interactions. Give your health care provider a list of all the medicines, herbs, non-prescription drugs, or dietary supplements you use. Also tell them if you smoke, drink alcohol, or use illegal drugs. Some items may interact with your medicine. What should I watch for while using this medicine? Visit your doctor for regular check ups. This vaccine, like all vaccines, may not fully protect everyone. What side effects may I notice from receiving this medicine? Side effects that you should report to your doctor or health care professional as soon as possible:  allergic reactions like skin rash, itching or hives, swelling of the face, lips, or tongue  breathing problems Side effects that usually do not require medical attention (report these to your doctor or health care professional if they continue or are bothersome):  chills  headache  fever  nausea, vomiting  redness, warmth, pain, swelling or itching at site where injected  tiredness This list may not describe all possible side effects. Call your doctor for medical advice about side effects. You may report side effects to FDA at 1-800-FDA-1088. Where should I keep my medicine? This vaccine is only given in a clinic, pharmacy, doctor's office, or  other health care setting and will not be stored at home. NOTE: This sheet is a summary. It may not cover all possible information. If you have questions about this medicine, talk to your doctor, pharmacist, or health care provider.  2020 Elsevier/Gold Standard (2016-11-25 13:20:30)

## 2020-04-10 ENCOUNTER — Telehealth: Payer: Self-pay | Admitting: Hematology

## 2020-04-10 ENCOUNTER — Other Ambulatory Visit: Payer: Self-pay

## 2020-04-10 ENCOUNTER — Encounter (HOSPITAL_BASED_OUTPATIENT_CLINIC_OR_DEPARTMENT_OTHER): Payer: PRIVATE HEALTH INSURANCE | Admitting: Internal Medicine

## 2020-04-10 DIAGNOSIS — I87332 Chronic venous hypertension (idiopathic) with ulcer and inflammation of left lower extremity: Secondary | ICD-10-CM | POA: Diagnosis not present

## 2020-04-10 NOTE — Telephone Encounter (Signed)
Received a new hem referral from Dr. [name] for anemia. [name] has been cld and scheduled to see [name] on 1/6 at 1pm. Pt aware to arrive 30 minutes early. Letter mailed.

## 2020-04-11 NOTE — Progress Notes (Addendum)
[name] ([id number]) Visit Report for 04/10/2020 Arrival Information Details Patient Name: Date of Service: [name], [name] 04/10/2020 12:45 PM Medical Record Number: [id number] Patient Account Number: [id number] Date of Birth/Sex: Treating RN: 07/21/1970 (49 y.o. [name] Primary Care Sophiarose Eades: PA TIENT, NO Other Clinician: Referring Elaysia Devargas: Treating Monserath Neff/Extender: Cheree Ditto in Treatment: 4 Visit Information History Since Last Visit Added or deleted any medications: No Patient Arrived: Ambulatory Any new allergies or adverse reactions: No Arrival Time: 12:44 Had a fall or experienced change in No Accompanied By: self activities of daily living that may affect Transfer Assistance: None risk of falls: Patient Identification Verified: Yes Signs or symptoms of abuse/neglect since last visito No Secondary Verification Process Completed: Yes Hospitalized since last visit: No Patient Requires Transmission-Based Precautions: No Implantable device outside of the clinic excluding No Patient Has Alerts: Yes cellular tissue based products placed in the center Patient Alerts: Patient on Blood Thinner since last visit: Has Dressing in Place as Prescribed: Yes Has Compression in Place as Prescribed: Yes Pain Present Now: No Electronic Signature(s) Signed: 04/11/2020 4:43:44 PM By: Deon Pilling Entered By: Deon Pilling on 04/11/2020 07:45:43 -------------------------------------------------------------------------------- Clinic Level of Care Assessment Details Patient Name: Date of Service: [name], [name] D. 04/10/2020 12:45 PM Medical Record Number: [id number] Patient Account Number: [id number] Date of Birth/Sex: Treating RN: Jul 31, 1970 (49 y.o. [name] Primary Care Ikeya Brockel: PA TIENT, NO Other Clinician: Referring Kearstyn Avitia: Treating Kayleah Appleyard/Extender: Cheree Ditto in Treatment: 4 Clinic Level of Care Assessment  Items TOOL 4 Quantity Score X- 1 0 Use when only an EandM is performed on FOLLOW-UP visit ASSESSMENTS - Nursing Assessment / Reassessment X- 1 10 Reassessment of Co-morbidities (includes updates in patient status) X- 1 5 Reassessment of Adherence to Treatment Plan ASSESSMENTS - Wound and Skin A ssessment / Reassessment X - Simple Wound Assessment / Reassessment - one wound 1 5 []  - 0 Complex Wound Assessment / Reassessment - multiple wounds []  - 0 Dermatologic / Skin Assessment (not related to wound area) ASSESSMENTS - Focused Assessment []  - 0 Circumferential Edema Measurements - multi extremities []  - 0 Nutritional Assessment / Counseling / Intervention X- 1 5 Lower Extremity Assessment (monofilament, tuning fork, pulses) []  - 0 Peripheral Arterial Disease Assessment (using hand held doppler) ASSESSMENTS - Ostomy and/or Continence Assessment and Care []  - 0 Incontinence Assessment and Management []  - 0 Ostomy Care Assessment and Management (repouching, etc.) PROCESS - Coordination of Care X - Simple Patient / Family Education for ongoing care 1 15 []  - 0 Complex (extensive) Patient / Family Education for ongoing care X- 1 10 Staff obtains Programmer, systems, Records, T Results / Process Orders est []  - 0 Staff telephones HHA, Nursing Homes / Clarify orders / etc []  - 0 Routine Transfer to another Facility (non-emergent condition) []  - 0 Routine Hospital Admission (non-emergent condition) []  - 0 New Admissions / Biomedical engineer / Ordering NPWT Apligraf, etc. , []  - 0 Emergency Hospital Admission (emergent condition) X- 1 10 Simple Discharge Coordination []  - 0 Complex (extensive) Discharge Coordination PROCESS - Special Needs []  - 0 Pediatric / Minor Patient Management []  - 0 Isolation Patient Management []  - 0 Hearing / Language / Visual special needs []  - 0 Assessment of Community assistance (transportation, D/C planning, etc.) []  - 0 Additional  assistance / Altered mentation []  - 0 Support Surface(s) Assessment (bed, cushion, seat, etc.) INTERVENTIONS - Wound Cleansing / Measurement X - Simple Wound Cleansing - one wound  1 5 []  - 0 Complex Wound Cleansing - multiple wounds X- 1 5 Wound Imaging (photographs - any number of wounds) []  - 0 Wound Tracing (instead of photographs) X- 1 5 Simple Wound Measurement - one wound []  - 0 Complex Wound Measurement - multiple wounds INTERVENTIONS - Wound Dressings []  - 0 Small Wound Dressing one or multiple wounds []  - 0 Medium Wound Dressing one or multiple wounds []  - 0 Large Wound Dressing one or multiple wounds []  - 0 Application of Medications - topical []  - 0 Application of Medications - injection INTERVENTIONS - Miscellaneous []  - 0 External ear exam []  - 0 Specimen Collection (cultures, biopsies, blood, body fluids, etc.) []  - 0 Specimen(s) / Culture(s) sent or taken to Lab for analysis []  - 0 Patient Transfer (multiple staff / Civil Service fast streamer / Similar devices) []  - 0 Simple Staple / Suture removal (25 or less) []  - 0 Complex Staple / Suture removal (26 or more) []  - 0 Hypo / Hyperglycemic Management (close monitor of Blood Glucose) []  - 0 Ankle / Brachial Index (ABI) - do not check if billed separately X- 1 5 Vital Signs Has the patient been seen at the hospital within the last three years: Yes Total Score: 80 Level Of Care: New/Established - Level 3 Electronic Signature(s) Signed: 04/11/2020 5:24:10 PM By: Levan Hurst RN, BSN Entered By: Levan Hurst on 04/11/2020 10:22:18 -------------------------------------------------------------------------------- Encounter Discharge Information Details Patient Name: Date of Service: Megan Session D. 04/10/2020 12:45 PM Medical Record Number: [id number] Patient Account Number: [id number] Date of Birth/Sex: Treating RN: 1970-08-11 (49 y.o. [name] Primary Care Samuell Knoble: PA Haig Prophet, NO Other  Clinician: Referring Aleksa Catterton: Treating Kacyn Souder/Extender: Cheree Ditto in Treatment: 4 Encounter Discharge Information Items Discharge Condition: Stable Ambulatory Status: Ambulatory Discharge Destination: Home Transportation: Private Auto Accompanied By: alone Schedule Follow-up Appointment: Yes Clinical Summary of Care: Patient Declined Electronic Signature(s) Signed: 04/11/2020 5:24:10 PM By: Levan Hurst RN, BSN Entered By: Levan Hurst on 04/11/2020 10:22:54 -------------------------------------------------------------------------------- Lower Extremity Assessment Details Patient Name: Date of Service: Megan Session D. 04/10/2020 12:45 PM Medical Record Number: [id number] Patient Account Number: [id number] Date of Birth/Sex: Treating RN: 12-09-1970 (49 y.o. [name] Primary Care Vlasta Baskin: PA Haig Prophet, [place] Other Clinician: Referring Ivett Luebbe: Treating Tameka Hoiland/Extender: Cheree Ditto in Treatment: 4 Edema Assessment Assessed: [Left: Yes] [Right: No] Edema: [Left: Ye] [Right: s] Calf Left: Right: Point of Measurement: 42 cm From Medial Instep 41 cm Ankle Left: Right: Point of Measurement: 11 cm From Medial Instep 23 cm Vascular Assessment Pulses: Dorsalis Pedis Palpable: [Left:Yes] Electronic Signature(s) Signed: 04/11/2020 4:43:44 PM By: Deon Pilling Entered By: Deon Pilling on 04/11/2020 07:46:22 -------------------------------------------------------------------------------- Multi Wound Chart Details Patient Name: Date of Service: Megan Session D. 04/10/2020 12:45 PM Medical Record Number: [id number] Patient Account Number: [id number] Date of Birth/Sex: Treating RN: 1970/07/19 (49 y.o. [name] Primary Care Jacquetta Polhamus: PA Haig Prophet, NO Other Clinician: Referring Kathey Simer: Treating Konner Warrior/Extender: Cheree Ditto in Treatment: 4 Vital Signs Height(in): 66 Pulse(bpm): 96 Weight(lbs): 327 Blood Pressure(mmHg):  102/63 Body Mass Index(BMI): 53 Temperature(F): 97.7 Respiratory Rate(breaths/min): 16 Photos: [1:No Photos Left, Medial Lower Leg] [N/A:N/A N/A] Wound Location: [1:Gradually Appeared] [N/A:N/A] Wounding Event: [1:Venous Leg Ulcer] [N/A:N/A] Primary Etiology: [1:10/28/2019] [N/A:N/A] Date Acquired: [1:4] [N/A:N/A] Weeks of Treatment: [1:Healed - Epithelialized] [N/A:N/A] Wound Status: [1:0x0x0] [N/A:N/A] Measurements L x W x D (cm) [1:0] [N/A:N/A] A (cm) : rea [1:0] [N/A:N/A] Volume (cm) : [1:100.00%] [N/A:N/A] % Reduction in A rea: [1:100.00%] [  N/A:N/A] % Reduction in Volume: [1:Full Thickness Without Exposed] [N/A:N/A] Classification: [1:Support Structures] Treatment Notes Electronic Signature(s) Signed: 04/13/2020 8:09:40 AM By: Linton Ham MD Signed: 04/14/2020 5:46:53 PM By: Levan Hurst RN, BSN Entered By: Linton Ham on 04/12/2020 08:25:14 -------------------------------------------------------------------------------- Multi-Disciplinary Care Plan Details Patient Name: Date of Service: Megan Session D. 04/10/2020 12:45 PM Medical Record Number: [id number] Patient Account Number: [id number] Date of Birth/Sex: Treating RN: 08/12/1970 (49 y.o. [name] Primary Care Lashaundra Lehrmann: PA Darnelle Spangle Other Clinician: Referring Roselani Grajeda: Treating Thompson Mckim/Extender: Cheree Ditto in Treatment: 4 Active Inactive Electronic Signature(s) Signed: 04/11/2020 5:24:10 PM By: Levan Hurst RN, BSN Entered By: Levan Hurst on 04/11/2020 10:21:44 -------------------------------------------------------------------------------- Pain Assessment Details Patient Name: Date of Service: Megan Session D. 04/10/2020 12:45 PM Medical Record Number: [id number] Patient Account Number: [id number] Date of Birth/Sex: Treating RN: 02-03-1971 (49 y.o. [name] Primary Care Namiah Dunnavant: PA Haig Prophet, [place] Other Clinician: Referring Kirby Argueta: Treating  Yee Gangi/Extender: Cheree Ditto in Treatment: 4 Active Problems Location of Pain Severity and Description of Pain Patient Has Paino No Site Locations Rate the pain. Current Pain Level: 0 Pain Management and Medication Current Pain Management: Medication: No Cold Application: No Rest: No Massage: No Activity: No T.E.N.S.: No Heat Application: No Leg drop or elevation: No Is the Current Pain Management Adequate: Adequate How does your wound impact your activities of daily livingo Sleep: No Bathing: No Appetite: No Relationship With Others: No Bladder Continence: No Emotions: No Bowel Continence: No Work: No Toileting: No Drive: No Dressing: No Hobbies: No Electronic Signature(s) Signed: 04/11/2020 4:43:44 PM By: Deon Pilling Entered By: Deon Pilling on 04/11/2020 07:46:08 -------------------------------------------------------------------------------- Patient/Caregiver Education Details Patient Name: Date of Service: Megan Session D. 12/13/2021andnbsp12:45 PM Medical Record Number: [id number] Patient Account Number: [id number] Date of Birth/Gender: Treating RN: Jan 27, 1971 (49 y.o. [name] Primary Care Physician: PA Haig Prophet, [place] Other Clinician: Referring Physician: Treating Physician/Extender: Cheree Ditto in Treatment: 4 Education Assessment Education Provided To: Patient Education Topics Provided Wound/Skin Impairment: Methods: Explain/Verbal Responses: State content correctly Electronic Signature(s) Signed: 04/11/2020 5:24:10 PM By: Levan Hurst RN, BSN Entered By: Levan Hurst on 04/11/2020 10:21:55 -------------------------------------------------------------------------------- Wound Assessment Details Patient Name: Date of Service: Megan Session D. 04/10/2020 12:45 PM Medical Record Number: [id number] Patient Account Number: [id number] Date of Birth/Sex: Treating RN: [birth year] (49 y.o. [name] Primary  Care Andrell Tallman: PA TIENT, [place] Other Clinician: Referring Andrina Locken: Treating Evans Levee/Extender: Cheree Ditto in Treatment: 4 Wound Status Wound Number: 1 Primary Etiology: Venous Leg Ulcer Wound Location: Left, Medial Lower Leg Wound Status: Healed - Epithelialized Wounding Event: Gradually Appeared Date Acquired: 10/28/2019 Weeks Of Treatment: 4 Clustered Wound: No Photos Photo Uploaded By: Mikeal Hawthorne on 04/13/2020 14:09:42 Wound Measurements Length: (cm) Width: (cm) Depth: (cm) Area: (cm) Volume: (cm) 0 % Reduction in Area: 100% 0 % Reduction in Volume: 100% 0 0 0 Wound Description Classification: Full Thickness Without Exposed Support Structur es Electronic Signature(s) Signed: 04/11/2020 4:43:44 PM By: Deon Pilling Entered By: Deon Pilling on 04/11/2020 07:46:30 -------------------------------------------------------------------------------- Vitals Details Patient Name: Date of Service: [name], Pandora D. 04/10/2020 12:45 PM Medical Record Number: [id number] Patient Account Number: [id number] Date of Birth/Sex: Treating RN: [birth year] (49 y.o. [name] Primary Care Kathey Simer: PA TIENT, [place] Other Clinician: Referring Demarcus Thielke: Treating Adreona Brand/Extender: Cheree Ditto in Treatment: 4 Vital Signs Time Taken: 12:44 Temperature (F): 97.7 Height (in): 66 Pulse (bpm): 96 Weight (lbs): 327 Respiratory Rate (breaths/min): 16 Body Mass Index (  BMI): 52.8 Blood Pressure (mmHg): 102/63 Reference Range: 80 - 120 mg / dl Electronic Signature(s) Signed: 04/11/2020 4:43:44 PM By: Deon Pilling Entered By: Deon Pilling on 04/11/2020 07:45:59

## 2020-04-13 ENCOUNTER — Other Ambulatory Visit: Payer: Self-pay

## 2020-04-13 ENCOUNTER — Encounter: Payer: Self-pay | Admitting: Cardiology

## 2020-04-13 ENCOUNTER — Ambulatory Visit (INDEPENDENT_AMBULATORY_CARE_PROVIDER_SITE_OTHER): Payer: Medicaid Other | Admitting: Cardiology

## 2020-04-13 DIAGNOSIS — I10 Essential (primary) hypertension: Secondary | ICD-10-CM | POA: Insufficient documentation

## 2020-04-13 DIAGNOSIS — I872 Venous insufficiency (chronic) (peripheral): Secondary | ICD-10-CM | POA: Diagnosis not present

## 2020-04-13 DIAGNOSIS — I82562 Chronic embolism and thrombosis of left calf muscular vein: Secondary | ICD-10-CM | POA: Insufficient documentation

## 2020-04-13 DIAGNOSIS — I5030 Unspecified diastolic (congestive) heart failure: Secondary | ICD-10-CM

## 2020-04-13 DIAGNOSIS — D649 Anemia, unspecified: Secondary | ICD-10-CM | POA: Diagnosis not present

## 2020-04-13 DIAGNOSIS — I5032 Chronic diastolic (congestive) heart failure: Secondary | ICD-10-CM

## 2020-04-13 DIAGNOSIS — Z95828 Presence of other vascular implants and grafts: Secondary | ICD-10-CM | POA: Insufficient documentation

## 2020-04-13 HISTORY — DX: Unspecified diastolic (congestive) heart failure: I50.30

## 2020-04-13 MED ORDER — VALSARTAN 80 MG PO TABS: 80.0000 mg | ORAL_TABLET | Freq: Every day | ORAL | 3 refills | Status: DC

## 2020-04-13 NOTE — Progress Notes (Signed)
[name] ([id number]) Visit Report for 04/10/2020 HPI Details Patient Name: Date of Service: [name], [name] 04/10/2020 12:45 PM Medical Record Number: [id number] Patient Account Number: [id number] Date of Birth/Sex: Treating RN: Jun 08, 1970 (49 y.o. [name] Primary Care Provider: PA Haig Prophet, NO Other Clinician: Referring Provider: Treating Provider/Extender: Cheree Ditto in Treatment: 4 History of Present Illness HPI Description: ADMISSION 03/10/2020 This is a 49 year old woman who was recently moved to this area from [place] to help look after her mother who is ill. She has a several month history of a wound on the left medial lower leg. She was in the emergency room on 9/23 and 10/12. On both occasions she had x-rays that were negative and she was given doxycycline. On one occasion she had an Haematologist wrap. T the diameter of the wound on 10/12 was 6 cm I do not think a stat large today however her o compression stockings no longer fit because of swelling. Furthermore she tells me she has had external compression pumps but they are not working either. She comes with her last note from her primary doctor in [place] from June. She has a large list of prior diagnoses including iron deficiency anemia, anxiety, asthma, hypertension, coronary artery disease with an MI in 2007, cellulitis in 2019, congestive heart failure, history of a DVT on Eliquis with the advice to stay on this "lifelong", gastric ulcer, chronic blood loss history of an iron infusion given in 2019 with a complete GI work-up negative, osteoarthritis of the left knee, cyst history of a small bowel obstruction, upper GI bleed and prediabetes. She comes in with a long list of medications however the only medications she is taking currently are Eliquis 5 mg twice daily and amlodipine 10 mg daily. She is supposed to be taking atorvastatin, Lasix 80 mg a day, isosorbide mononitrate 30 mg a day,  Klor-Con 20 mEq a day but she does not appear to be taking any of these. She tells me that she has had difficulty getting a primary doctor ABI in our clinic was 1.17 on the left 03/27/2020; patient has a wound on the left anterior lower leg in the setting of chronic lymphedema. The wound margins have been coming in nicely. 12/6; left anterior lower leg wound in the setting of chronic lymphedema. Her wound is contracting. She brought her stockings which are some form of external compression garment I have never really seen these but she said she paid $70 for them in [place] about a year ago 12/13; this is a patient with chronic venous insufficiency and lymphedema. She has a wound on her left medial lower leg that is healed today. She has severe hemosiderin deposition. She has her own 30 mm compression stocking Electronic Signature(s) Signed: 04/13/2020 8:09:40 AM By: Linton Ham MD Entered By: Linton Ham on 04/12/2020 08:26:08 -------------------------------------------------------------------------------- Physical Exam Details Patient Name: Date of Service: [name], [name] 04/10/2020 12:45 PM Medical Record Number: [id number] Patient Account Number: [id number] Date of Birth/Sex: Treating RN: 10/22/1970 (49 y.o. [name] Primary Care Provider: PA Haig Prophet, NO Other Clinician: Referring Provider: Treating Provider/Extender: Cheree Ditto in Treatment: 4 Constitutional Sitting or standing Blood Pressure is within target range for patient.. Pulse regular and within target range for patient.[name] Respirations regular, non-labored and within target range.. Temperature is normal and within the target range for the patient.[name] Appears in no distress. Cardiovascular Pedal pulses are palpable. Notes Wound exam; the area on the left anterior  medial tibial area. The surface of the wound is entirely epithelialized. Her edema control is adequate Electronic Signature(s) Signed:  04/13/2020 8:09:40 AM By: Linton Ham MD Entered By: Linton Ham on 04/12/2020 08:28:49 -------------------------------------------------------------------------------- Physician Orders Details Patient Name: Date of Service: Megan Session D. 04/10/2020 12:45 PM Medical Record Number: [id number] Patient Account Number: [id number] Date of Birth/Sex: Treating RN: 08-10-1970 (49 y.o. [name] Primary Care Provider: PA Haig Prophet, NO Other Clinician: Referring Provider: Treating Provider/Extender: Cheree Ditto in Treatment: 4 Verbal / Phone Orders: No Diagnosis Coding Discharge From [facility] Discharge from [facility] Edema Control - Lymphedema / SCD / Other Elevate legs to the level of the heart or above for 30 minutes daily and/or when sitting, a frequency of: Avoid standing for long periods of time. Exercise regularly Moisturize legs daily. Compression stocking or Garment 30-40 mm/Hg pressure to: - both legs Electronic Signature(s) Signed: 04/11/2020 5:24:10 PM By: Levan Hurst RN, BSN Signed: 04/13/2020 8:09:40 AM By: Linton Ham MD Entered By: Levan Hurst on 04/11/2020 10:21:22 -------------------------------------------------------------------------------- Problem List Details Patient Name: Date of Service: Megan Session D. 04/10/2020 12:45 PM Medical Record Number: [id number] Patient Account Number: [id number] Date of Birth/Sex: Treating RN: [birth year] (49 y.o. [name] Primary Care Provider: PA Haig Prophet, NO Other Clinician: Referring Provider: Treating Provider/Extender: Cheree Ditto in Treatment: 4 Active Problems ICD-10 Encounter Code Description Active Date MDM Diagnosis L97.822 Non-pressure chronic ulcer of other part of left lower leg with fat layer exposed11/03/2020 No Yes I89.0 Lymphedema, not elsewhere classified 03/10/2020 No Yes I87.332 Chronic venous hypertension (idiopathic) with ulcer and  inflammation of left 03/10/2020 No Yes lower extremity Inactive Problems Resolved Problems Electronic Signature(s) Signed: 04/13/2020 8:09:40 AM By: Linton Ham MD Entered By: Linton Ham on 04/12/2020 08:24:53 -------------------------------------------------------------------------------- Progress Note Details Patient Name: Date of Service: [name], [name] 04/10/2020 12:45 PM Medical Record Number: [id number] Patient Account Number: [id number] Date of Birth/Sex: Treating RN: 19-Apr-1971 (49 y.o. [name] Primary Care Provider: PA Haig Prophet, NO Other Clinician: Referring Provider: Treating Provider/Extender: Cheree Ditto in Treatment: 4 Subjective History of Present Illness (HPI) ADMISSION 03/10/2020 This is a 49 year old woman who was recently moved to this area from [place] to help look after her mother who is ill. She has a several month history of a wound on the left medial lower leg. She was in the emergency room on 9/23 and 10/12. On both occasions she had x-rays that were negative and she was given doxycycline. On one occasion she had an Haematologist wrap. T the diameter of the wound on 10/12 was 6 cm I do not think a stat large today however her o compression stockings no longer fit because of swelling. Furthermore she tells me she has had external compression pumps but they are not working either. She comes with her last note from her primary doctor in [place] from June. She has a large list of prior diagnoses including iron deficiency anemia, anxiety, asthma, hypertension, coronary artery disease with an MI in 2007, cellulitis in 2019, congestive heart failure, history of a DVT on Eliquis with the advice to stay on this "lifelong", gastric ulcer, chronic blood loss history of an iron infusion given in 2019 with a complete GI work-up negative, osteoarthritis of the left knee, cyst history of a small bowel obstruction, upper GI bleed and prediabetes. She  comes in with a long list of medications however the only medications she is taking currently are Eliquis  5 mg twice daily and amlodipine 10 mg daily. She is supposed to be taking atorvastatin, Lasix 80 mg a day, isosorbide mononitrate 30 mg a day, Klor-Con 20 mEq a day but she does not appear to be taking any of these. She tells me that she has had difficulty getting a primary doctor ABI in our clinic was 1.17 on the left 03/27/2020; patient has a wound on the left anterior lower leg in the setting of chronic lymphedema. The wound margins have been coming in nicely. 12/6; left anterior lower leg wound in the setting of chronic lymphedema. Her wound is contracting. She brought her stockings which are some form of external compression garment I have never really seen these but she said she paid $70 for them in [place] about a year ago 12/13; this is a patient with chronic venous insufficiency and lymphedema. She has a wound on her left medial lower leg that is healed today. She has severe hemosiderin deposition. She has her own 30 mm compression stocking Objective Constitutional Sitting or standing Blood Pressure is within target range for patient.. Pulse regular and within target range for patient.[name] Respirations regular, non-labored and within target range.. Temperature is normal and within the target range for the patient.[name] Appears in no distress. Vitals Time Taken: 12:44 PM, Height: 66 in, Weight: 327 lbs, BMI: 52.8, Temperature: 97.7 F, Pulse: 96 bpm, Respiratory Rate: 16 breaths/min, Blood Pressure: 102/63 mmHg. Cardiovascular Pedal pulses are palpable. General Notes: Wound exam; the area on the left anterior medial tibial area. The surface of the wound is entirely epithelialized. Her edema control is adequate Integumentary (Hair, Skin) Wound #1 status is Healed - Epithelialized. Original cause of wound was Gradually Appeared. The wound is located on the Left,Medial Lower Leg. The  wound measures 0cm length x 0cm width x 0cm depth; 0cm^2 area and 0cm^3 volume. Assessment Active Problems ICD-10 Non-pressure chronic ulcer of other part of left lower leg with fat layer exposed Lymphedema, not elsewhere classified Chronic venous hypertension (idiopathic) with ulcer and inflammation of left lower extremity Plan Discharge From [facility] Services: Discharge from [facility] Edema Control - Lymphedema / SCD / Other: Elevate legs to the level of the heart or above for 30 minutes daily and/or when sitting, a frequency of: Avoid standing for long periods of time. Exercise regularly Moisturize legs daily. Compression stocking or Garment 30-40 mm/Hg pressure to: - both legs 1. The patient can be discharged from the wound care center. 2. She has her own 30/40 mmHg stockings to both legs. Advised to moisturize her legs religiously, leg elevation, activity etc. Electronic Signature(s) Signed: 04/13/2020 8:09:40 AM By: Linton Ham MD Entered By: Linton Ham on 04/12/2020 08:29:29 -------------------------------------------------------------------------------- SuperBill Details Patient Name: Date of Service: [name], Sheyann D. 04/10/2020 Medical Record Number: [id number] Patient Account Number: [id number] Date of Birth/Sex: Treating RN: 1971/04/09 (49 y.o. [name] Primary Care Provider: PA TIENT, NO Other Clinician: Referring Provider: Treating Provider/Extender: Cheree Ditto in Treatment: 4 Diagnosis Coding ICD-10 Codes Code Description [phone] Non-pressure chronic ulcer of other part of left lower leg with fat layer exposed I89.0 Lymphedema, not elsewhere classified I87.332 Chronic venous hypertension (idiopathic) with ulcer and inflammation of left lower extremity Facility Procedures CPT4 Code: [id number] Description: 52841 - WOUND CARE VISIT-LEV 3 EST PT Modifier: Quantity: 1 Physician Procedures Electronic Signature(s) Signed:  04/13/2020 8:09:40 AM By: Linton Ham MD Previous Signature: 04/11/2020 5:24:10 PM Version By: Levan Hurst RN, BSN Entered By: Linton Ham on 04/12/2020  08:30:02 

## 2020-04-13 NOTE — Progress Notes (Signed)
Primary Care Provider: Ladell Pier, MD Cardiologist: No primary care provider on file. Electrophysiologist: None  Wound Care MD: Dr. Linton Ham  Clinic Note: Chief Complaint  Patient presents with  . New Patient (Initial Visit)  . Hypertension    With possible diastolic heart failure  . Leg Swelling    Chronic bilateral venous stasis   Problem List Items Addressed This Visit    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 (HCC)   Venous insufficiency   Presence of IVC filter   Chronic anemia   Essential hypertension   (HFpEF) heart failure with preserved ejection fraction (HCC)     HPI:    [name] is a 49 y.o. female who is being seen today to establish local Cardiologist at the request of Ladell Pier, MD.  Lynnell Jude Mangus was last seen on September 29, 2019 by Lauralee Evener, NP ([place], [place]) -> weight check.  She worked on try to cut down her fried foods etc. was walking on the track every 2 days and has been feeling quite well.  Plan was for her to move to [place] to support her mother as a caregiver.  Recent Hospitalizations:   December 10, 2019 [facility], [place], [place]): EMS - generalized weekness x 1 wk; SOB.  --> Acute Blood Loss Anemia - Heme ++ Stool --> NO CHF exacerbation. (IV Lasix given during PRBC infusion.   Hgb 4.7 -- 3 Unit PRBC Transfusion => to 11.3  EGD & Colonscopy 8/16: no source of bleeding --> planned OP Video Capsule Endo.   D/c Meds: Atorvastatin 40 mg, Buspar 15 mg, Carvedilol 25 mg BID, Vit D3, Corvita 263-7.85 mg (Iron-Folic Acid), Eliquis 5 mg BID,Flovent 110 mcg INH, furosemide 80 mg, spironolactone 25 mg -> reduced amlodipine to 2.5 mg, New: Omeprazole 20 mg.    Sept 23, 2021: [facility] ER - LLE cellulitis / non-healing Venous Stasis Ulcer with increased LE swelling x 2 wks (painful to walk). NO SIGN OF CHF. Afebrile. Wound was dressed & wrapped, IV Lasix for edema;  Doxcycline.   02/08/20: [place]. ER -> L Leg wound; NO Sign of osteomyelitis.    She recently moved to [place] to be closer to her mother whose health is failing.  Apparently she has been out of her medications for 4 months.  She was seen by Dr. [name] to initiate primary care on December 10 & subsequently referred for to establish Cardiology Care b/c of Chart History indicating CAD & HFpEF.  -> noted L>R LE Edema & ~3 pillow "orthopnea" with ~ PND.  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  Previously told that she was TOO HIGH RISK for GOP ??   Reviewed  CV studies:    The following studies were reviewed today: (if available, images/films reviewed: From Epic Chart or Care Everywhere)  TTE 12/11/2019: EF 88-50%, mild Diastolic Dysfunction, LOW NORMAL LA Pressures  Interval History:   [name] cardiology care.  Very complicated history in the past.  When I asked her specifically about her cardiac history, see separate she does not recall having had heart catheterization in the past.  She is  not sure what the diagnosis of CAD came from.  She has had a history of CHF, but has always had a relatively normal echocardiogram and normal diastolic function based on the most recent echo.  There is low normal LA filling pressures.  This would argue against significant diastolic heart failure.  She does have chronic venous stasis however with recurrent DVTs and is on lifelong Eliquis.  She has difficulty controlling/resistant hypertension now on multiple medications which would clearly lead her to have some diastolic dysfunction.  She denies any irregular heartbeats palpitations.  She  does sleep on extra pillows, but is also morbidly obese.  She denies any real PND symptoms besides occasionally waking up to go to the bathroom short of breath.  She did tell me that most of her cardiac evaluation was done at [facility] and we will try to get some of those records from her.  CV Review of Symptoms (Summary): positive for - dyspnea on exertion, edema and orthopnea negative for - chest pain, irregular heartbeat, palpitations, paroxysmal nocturnal dyspnea, rapid heart rate, shortness of breath or Syncope/syncope or TIA/amaurosis fugax, claudication  The patient does not have symptoms concerning for COVID-19 infection (fever, chills, cough, or new shortness of breath).   REVIEWED OF SYSTEMS   Review of Systems  Constitutional: Negative for malaise/fatigue and weight loss.  HENT: Positive for congestion. Negative for nosebleeds.   Respiratory: Positive for cough. Negative for shortness of breath.   Cardiovascular: Positive for leg swelling.  Gastrointestinal: Negative for abdominal pain, blood in stool, constipation and melena.  Genitourinary: Negative for frequency and hematuria.  Musculoskeletal: Positive for joint pain (knees & hips bother her).       L Leg wound hurts  Neurological: Negative for dizziness, seizures and headaches.       Stuttering speech  Psychiatric/Behavioral: Positive for memory loss. Negative for depression. The patient is nervous/anxious.        Very anxious; unsure of her history  All other systems reviewed and are negative.   I have reviewed and (if needed) personally updated the patient's problem list, medications, allergies, past medical and surgical history, social and family history.   PAST MEDICAL HISTORY   Past Medical History:  Diagnosis Date  . CAD (coronary artery disease)    PT STATES - HAD A CARDIAC CATH - NOT TOLD SHE HAD CAD -> week note from [place] indicates history of MI (patient cannot corroborate  . Cellulitis    . CHF (congestive heart failure) ([place])   . DVT (deep venous thrombosis) ([place]) 09/17/2017   Recurrent DVT November, 2020-recommendation was lifelong DOAC  . Generalized anxiety disorder   . H/O gastric ulcer 11/16/2018  . History of small bowel obstruction    In childhood  . Hypertension   . Iron deficiency anemia due to chronic blood loss    Previously been followed by hematology for iron infusion every 2 weeks and as of 2019; full GI evaluation including capsule endoscopy negative.  . Morbid obesity due to excess calories ([place])   . Osteoarthritis of left knee   . Prediabetes   . Small bowel obstruction ([place])    as a child  . Speech impediment    Stutter / stammer    PAST SURGICAL HISTORY   Past Surgical History:  Procedure Laterality Date  . BILATERAL OOPHORECTOMY  08/06/2017  . Exploratory laparoscopy  02/13/2018   Release of SBO    Immunization History  Administered Date(s) Administered  . Zoster Recombinat (  Shingrix) 04/07/2020    MEDICATIONS/ALLERGIES   Current Meds  Medication Sig  . albuterol (VENTOLIN HFA) 108 (90 Base) MCG/ACT inhaler Inhale 2 puffs into the lungs every 6 (six) hours as needed for wheezing or shortness of breath.  [name] amLODipine (NORVASC) 5 MG tablet Take 0.5 tablets (2.5 mg total) by mouth daily.  [name] apixaban (ELIQUIS) 5 MG TABS tablet Take 1 tablet (5 mg total) by mouth 2 (two) times daily.  [name] atorvastatin (LIPITOR) 40 MG tablet Take 1 tablet (40 mg total) by mouth daily.  . busPIRone (BUSPAR) 15 MG tablet Take 15 mg by mouth daily.  . carvedilol (COREG) 25 MG tablet Take 1 tablet (25 mg total) by mouth 2 (two) times daily with a meal.  . cholecalciferol (VITAMIN D3) 25 MCG (1000 UNIT) tablet Take 1 tablet (1,000 Units total) by mouth daily.  . fluticasone (FLOVENT HFA) 110 MCG/ACT inhaler Inhale 2 puffs into the lungs daily.  . furosemide (LASIX) 80 MG tablet Take 1 tablet (80 mg total) by mouth daily.  [name] omeprazole (PRILOSEC) 20 MG capsule  Take 20 mg by mouth daily.  [name] PARoxetine (PAXIL) 10 MG tablet Take 1 tablet (10 mg total) by mouth daily.  . potassium chloride SA (KLOR-CON) 20 MEQ tablet Take 1 tablet (20 mEq total) by mouth daily.  [name] spironolactone (ALDACTONE) 25 MG tablet Take 1 tablet (25 mg total) by mouth daily.  . vitamin B-12 (CYANOCOBALAMIN) 1000 MCG tablet Take 1,000 mcg by mouth daily.    Allergies  Allergen Reactions  . Ace Inhibitors Rash and Other (See Comments)    Make pt bleed  . Aspirin Other (See Comments)    Per patient paperwork: blood clot?  Likely because of chronic DOAC  . Hydromorphone Hives and Itching  . Vancomycin Itching and Rash  . Contrast Media [Iodinated Diagnostic Agents] Hives  . Dilaudid [Hydromorphone Hcl] Hives    SOCIAL HISTORY/FAMILY HISTORY   Reviewed in Epic:  Pertinent findings:  Social History   Tobacco Use  . Smoking status: Former Research scientist (life sciences)  . Smokeless tobacco: Never Used  Vaping Use  . Vaping Use: Never used  Substance Use Topics  . Alcohol use: Not Currently  . Drug use: Not Currently   Social History   Social History Narrative  . Not on file    OBJCTIVE -PE, EKG, labs   Wt Readings from Last 3 Encounters:  04/13/20 (!) 333 lb 9.6 oz (151.3 kg)  04/07/20 (!) 333 lb 12.8 oz (151.4 kg)  01/20/20 (!) 320 lb (145.2 kg)    Physical Exam: BP (!) 158/92   Pulse 70   Ht 5\' 6"  (1.676 m)   Wt (!) 333 lb 9.6 oz (151.3 kg)   BMI 53.84 kg/m  Physical Exam Vitals reviewed.  Constitutional:      General: She is not in acute distress.    Appearance: She is toxic-appearing. She is not ill-appearing.     Comments: Super morbidly obese.  HENT:     Head: Normocephalic and atraumatic.  Eyes:     Extraocular Movements: Extraocular movements intact.  Neck:     Vascular: No carotid bruit, hepatojugular reflux or JVD.  Cardiovascular:     Rate and Rhythm: Normal rate and regular rhythm.  No extrasystoles are present.    Chest Wall: PMI is not displaced (Unable  to assess).     Pulses: Decreased pulses (Difficult to palpate pedal pulses due to body habitus and edema.).     Heart sounds: S1  normal and S2 normal. Heart sounds are distant. No murmur heard. No friction rub. No gallop.   Pulmonary:     Effort: Pulmonary effort is normal.     Comments: Distant breath sounds, but clear Chest:     Chest wall: No tenderness.  Abdominal:     General: Bowel sounds are normal. There is no distension.     Palpations: There is no mass.     Comments: Morbidly obese,.  Unable to assess HSM.  Musculoskeletal:        General: Normal range of motion.     Cervical back: Normal range of motion and neck supple.     Right lower leg: Edema (1-2+ firm (non-pitting) edema; compression stockings on) present.     Left lower leg: Edema (2-3+ firm - with compression stocking on) present.  Skin:    General: Skin is warm and dry.  Neurological:     General: No focal deficit present.     Mental Status: She is alert and oriented to person, place, and time.     Cranial Nerves: No cranial nerve deficit.     Motor: No weakness.  Psychiatric:        Mood and Affect: Mood normal.        Judgment: Judgment normal.     Comments: Poor historian; anxious; stammer - not able to remember history ditails     Adult ECG Report  Rate: 70 ;  Rhythm: normal sinus rhythm and Nonspecific ST and T wave changes.;   Narrative Interpretation: Borderline EKG.  Recent Labs: Reviewed  CBC Latest Ref Rng & Units 02/08/2020 01/20/2020  WBC 4.0 - 10.5 K/uL 5.0 5.8  Hemoglobin 12.0 - 15.0 g/dL 10.1(L) 10.1(L)  Hematocrit 36.0 - 46.0 % 35.3(L) 35.2(L)  Platelets 150 - 400 K/uL 148(L) 151   Related to CBC Component 12/15/19 12/14/19 12/13/19 12/13/19 12/12/19 12/12/19  WBC 5.1 5.3 -- 6.0 -- 5.6  RBC 4.37 4.40 -- 4.37 -- 4.20  HGB 11.4 11.6Low 11.4Low 11.5Low 12.1Low 11.3Low  HCT 39.2 39.1 38.4 39.3 40.3 36.8  MCV 89.7 88.9 -- 90.0 -- 88   MCH 26.1 26.4Low -- 26.3Low --  26.9Low  MCHC 29.1 29.7Low -- 29.3Low -- 30.7Low  Plt Ct 143 136Low -- 127Low -- 113Low  RDW SD 49.7 50.0High         09/2019: CBC: W 5.0; H/H 10.7 / 34.9, Plt 142; non-HDL cholesterol-72; A1c 5.5.  Glucose 87.  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  Unfortunately, she also has history of ongoing iron deficiency anemia from chronic blood loss.  She has been maintained on Eliquis. Continue to wear stockings and elevate feet.  Continue dorsiflexion, plantarflexion exercises.       Relevant Medications   valsartan (DIOVAN) 80 MG tablet   Other Relevant Orders   EKG 12-Lead (Completed)   Ambulatory referral to Vascular Surgery   Venous  insufficiency (Chronic)    Continue diuretics including Lasix and spironolactone, Compression stockings Foot elevation  Wound care  We will refer to [facility] Donzetta Matters from [facility] for management of IVC filter & chronic Venous Insufficiency & DVT.      Relevant Medications   valsartan (DIOVAN) 80 MG tablet   Other Relevant Orders   EKG 12-Lead (Completed)   Ambulatory referral to Vascular Surgery   Presence of IVC filter (Chronic)    On lifelong Eliquis due to recurrent DVT status post IVC filter.  Unfortunately, she continues to have significant lower extremity edema likely venous insufficiency.  Will refer to [facility] - Dr. Donzetta Matters to establish care.       Relevant Orders   EKG 12-Lead (Completed)   Ambulatory referral to Vascular  Surgery   Chronic anemia (Chronic)    Anticipate that she will need referral to Hematology - has required Iron infusions & blood transfusions (most recently in Aug).  Unfortunate situation of recurrent anemia and long-term need for DOAC. --> Referral to [facility], Dr. Jordan Likes reconsider need to continue lifelong DOAC.       Relevant Orders   EKG 12-Lead (Completed)   Ambulatory referral to Vascular Surgery   Resistant hypertension (Chronic)    Poor control despite multiple medications including low-dose amlodipine (reduced because of edema), high-dose carvedilol, spironolactone and furosemide.  Plan:  Add ARB: Valsartan 80 mg daily        Relevant Medications   valsartan (DIOVAN) 80 MG tablet   Other Relevant Orders   EKG 12-Lead (Completed)   Ambulatory referral to Vascular Surgery   (HFpEF) heart failure with preserved ejection fraction (HCC) (Chronic)    At most, her edema is probably not cardiac in nature.  Heart failure is as much related to her obesity and body habitus as opposed to simply diastolic dysfunction.  Most recent echo showed low normal LA pressures. Mainstay of treatment here is hypertension control and diuresis.  Add ARB > valsartan 80 mg daily      Relevant Medications   valsartan (DIOVAN) 80 MG tablet   Other Relevant Orders   EKG 12-Lead (Completed)   Ambulatory referral to Vascular Surgery      COVID-19 Education: The signs and symptoms of COVID-19 were discussed with the patient and how to seek care for testing (follow up with PCP or arrange E-visit).   The importance of social distancing and COVID-19 vaccination was discussed today.  The patient is practicing social distancing & Masking.   I spent a total of 35 minutes with the patient spent in direct patient consultation.  Additional time spent with chart review  / charting (studies, outside notes, etc): 35 Total Time: 34min   Current medicines are reviewed at length with the patient  today.  (+/- concerns) n/a  This visit occurred during the SARS-CoV-2 public health emergency.  Safety protocols were in place, including screening questions prior to the visit, additional usage of staff PPE, and extensive cleaning of exam room while observing appropriate contact time as indicated for disinfecting solutions.  Notice: This dictation was prepared with Dragon dictation along with smaller phrase technology. Any transcriptional errors that result from this process are unintentional and may not be corrected upon review.  Patient Instructions / Medication Changes & Studies & Tests Ordered   Patient Instructions  Medication Instructions:  Start Valsartan 80 mg daily Continue all other medications *If you need a refill on your cardiac medications before your next appointment, please call your pharmacy*  Lab Work: None ordered   Testing/Procedures: None ordered   Follow-Up: At [facility], you and your health needs are our priority.  As part of our continuing mission to provide you with exceptional heart care, we have created designated Provider Care Teams.  These Care Teams include your primary Cardiologist (physician) and Advanced Practice Providers (APPs -  Physician Assistants and Nurse Practitioners) who all work together to provide you with the care you need, when you need it.  We recommend signing up for the patient portal called "MyChart".  Sign up information is provided on this After Visit Summary.  MyChart is used to connect with patients for Virtual Visits (Telemedicine).  Patients are able to view lab/test results, encounter notes, upcoming appointments, etc.  Non-urgent messages can be sent to your provider as well.   To learn more about what you can do with MyChart, go to [email or web address].    Your next appointment:  3 months   The format for your next appointment: Office    Provider:  Dr.Ninoshka Wainwright  Schedule appointment with [facility]  Vascular/Vein  Specialist  Their office will call with appointment    Studies Ordered:   Orders Placed This Encounter  Procedures  . Ambulatory referral to Vascular Surgery  . EKG 12-Lead     Glenetta Hew, M.D., M.S. Interventional Cardiologist   Pager # [phone] Phone # [phone] [address] [place], [place] [address]   Thank you for choosing Heartcare at [facility]!!

## 2020-04-13 NOTE — Patient Instructions (Signed)
Medication Instructions:  Start Valsartan 80 mg daily Continue all other medications *If you need a refill on your cardiac medications before your next appointment, please call your pharmacy*   Lab Work: None ordered   Testing/Procedures: None ordered   Follow-Up: At [facility], you and your health needs are our priority.  As part of our continuing mission to provide you with exceptional heart care, we have created designated Provider Care Teams.  These Care Teams include your primary Cardiologist (physician) and Advanced Practice Providers (APPs -  Physician Assistants and Nurse Practitioners) who all work together to provide you with the care you need, when you need it.  We recommend signing up for the patient portal called "MyChart".  Sign up information is provided on this After Visit Summary.  MyChart is used to connect with patients for Virtual Visits (Telemedicine).  Patients are able to view lab/test results, encounter notes, upcoming appointments, etc.  Non-urgent messages can be sent to your provider as well.   To learn more about what you can do with MyChart, go to [email or web address].    Your next appointment:  3 months   The format for your next appointment: Office    Provider:  Dr.Harding  Schedule appointment with [facility]  Vascular/Vein Specialist  Their office will call with appointment

## 2020-04-16 ENCOUNTER — Encounter: Payer: Self-pay | Admitting: Cardiology

## 2020-04-16 NOTE — Assessment & Plan Note (Signed)
Continue diuretics including Lasix and spironolactone, Compression stockings Foot elevation  Wound care  We will refer to Dr. Donzetta Matters from [facility] for management of IVC filter & chronic Venous Insufficiency & DVT.

## 2020-04-16 NOTE — Assessment & Plan Note (Signed)
She does have a history of recurrent DVT, and recommendation was for lifelong DOAC.  Unfortunately, she also has history of ongoing iron deficiency anemia from chronic blood loss.  She has been maintained on Eliquis. Continue to wear stockings and elevate feet.  Continue dorsiflexion, plantarflexion exercises.

## 2020-04-16 NOTE — Assessment & Plan Note (Signed)
On lifelong Eliquis due to recurrent DVT status post IVC filter.  Unfortunately, she continues to have significant lower extremity edema likely venous insufficiency.  Will refer to [facility] - Dr. Donzetta Matters to establish care.

## 2020-04-16 NOTE — Assessment & Plan Note (Addendum)
Poor control despite multiple medications including low-dose amlodipine (reduced because of edema), high-dose carvedilol, spironolactone and furosemide.  Plan:  Add ARB: Valsartan 80 mg daily

## 2020-04-16 NOTE — Assessment & Plan Note (Signed)
Anticipate that she will need referral to Hematology - has required Iron infusions & blood transfusions (most recently in Aug).  Unfortunate situation of recurrent anemia and long-term need for DOAC. --> Referral to [facility], Dr. Jordan Likes reconsider need to continue lifelong DOAC.

## 2020-04-16 NOTE — Assessment & Plan Note (Signed)
At most, her edema is probably not cardiac in nature.  Heart failure is as much related to her obesity and body habitus as opposed to simply diastolic dysfunction.  Most recent echo showed low normal LA pressures. Mainstay of treatment here is hypertension control and diuresis.  Add ARB > valsartan 80 mg daily

## 2020-04-17 ENCOUNTER — Telehealth: Payer: Self-pay | Admitting: Internal Medicine

## 2020-04-17 NOTE — Telephone Encounter (Signed)
Copied from [place] [id number]. Topic: General - Other >> Apr 17, 2020  3:19 PM [name], [place] C wrote: Reason for CRM: pt called in to request something for pain for her back.   Pharmacy: [place], [place]  Phone:  [phone] Fax:  [phone]   Phone: [phone]    Please assist.

## 2020-04-18 NOTE — Telephone Encounter (Signed)
Will forward to pcp

## 2020-04-19 MED ORDER — ACETAMINOPHEN 500 MG PO TABS: 500.0000 mg | ORAL_TABLET | Freq: Four times a day (QID) | ORAL | 0 refills | Status: DC | PRN

## 2020-04-19 NOTE — Telephone Encounter (Signed)
Prescription for Tylenol 500 mg to use every 6 hours as needed sent to her pharmacy.

## 2020-05-04 ENCOUNTER — Ambulatory Visit (HOSPITAL_COMMUNITY)
Admission: RE | Admit: 2020-05-04 | Discharge: 2020-05-04 | Disposition: A | Discharge status: Home / Self Care | Payer: Medicaid Other | Source: Ambulatory Visit | Attending: Hematology | Admitting: Hematology

## 2020-05-04 ENCOUNTER — Other Ambulatory Visit: Payer: Self-pay

## 2020-05-04 ENCOUNTER — Inpatient Hospital Stay: Payer: Medicaid Other

## 2020-05-04 ENCOUNTER — Inpatient Hospital Stay: Payer: Medicaid Other | Attending: Hematology | Admitting: Hematology

## 2020-05-04 VITALS — BP 126/70 | HR 76 | Temp 97.8°F | Resp 17 | Ht 66.0 in | Wt 327.7 lb

## 2020-05-04 DIAGNOSIS — I5020 Unspecified systolic (congestive) heart failure: Secondary | ICD-10-CM | POA: Diagnosis not present

## 2020-05-04 DIAGNOSIS — E538 Deficiency of other specified B group vitamins: Secondary | ICD-10-CM

## 2020-05-04 DIAGNOSIS — Z7901 Long term (current) use of anticoagulants: Secondary | ICD-10-CM | POA: Insufficient documentation

## 2020-05-04 DIAGNOSIS — L97909 Non-pressure chronic ulcer of unspecified part of unspecified lower leg with unspecified severity: Secondary | ICD-10-CM | POA: Insufficient documentation

## 2020-05-04 DIAGNOSIS — Z95828 Presence of other vascular implants and grafts: Secondary | ICD-10-CM | POA: Insufficient documentation

## 2020-05-04 DIAGNOSIS — D509 Iron deficiency anemia, unspecified: Secondary | ICD-10-CM | POA: Diagnosis not present

## 2020-05-04 DIAGNOSIS — I2510 Atherosclerotic heart disease of native coronary artery without angina pectoris: Secondary | ICD-10-CM | POA: Insufficient documentation

## 2020-05-04 DIAGNOSIS — I517 Cardiomegaly: Secondary | ICD-10-CM | POA: Diagnosis not present

## 2020-05-04 DIAGNOSIS — Z79899 Other long term (current) drug therapy: Secondary | ICD-10-CM

## 2020-05-04 DIAGNOSIS — Z452 Encounter for adjustment and management of vascular access device: Secondary | ICD-10-CM | POA: Diagnosis not present

## 2020-05-04 DIAGNOSIS — D500 Iron deficiency anemia secondary to blood loss (chronic): Secondary | ICD-10-CM

## 2020-05-04 DIAGNOSIS — K922 Gastrointestinal hemorrhage, unspecified: Secondary | ICD-10-CM

## 2020-05-04 DIAGNOSIS — D649 Anemia, unspecified: Secondary | ICD-10-CM

## 2020-05-04 DIAGNOSIS — I825Z2 Chronic embolism and thrombosis of unspecified deep veins of left distal lower extremity: Secondary | ICD-10-CM | POA: Insufficient documentation

## 2020-05-04 DIAGNOSIS — I83009 Varicose veins of unspecified lower extremity with ulcer of unspecified site: Secondary | ICD-10-CM | POA: Diagnosis not present

## 2020-05-04 LAB — CBC WITH DIFFERENTIAL/PLATELET
Abs Immature Granulocytes: 0.01 10*3/uL (ref 0.00–0.07)
Basophils Absolute: 0 10*3/uL (ref 0.0–0.1)
Basophils Relative: 0 %
Eosinophils Absolute: 0.1 10*3/uL (ref 0.0–0.5)
Eosinophils Relative: 1 %
HCT: 34 % — ABNORMAL LOW (ref 36.0–46.0)
Hemoglobin: 9.8 g/dL — ABNORMAL LOW (ref 12.0–15.0)
Immature Granulocytes: 0 %
Lymphocytes Relative: 13 %
Lymphs Abs: 0.7 10*3/uL (ref 0.7–4.0)
MCH: 26.1 pg (ref 26.0–34.0)
MCHC: 28.8 g/dL — ABNORMAL LOW (ref 30.0–36.0)
MCV: 90.7 fL (ref 80.0–100.0)
Monocytes Absolute: 0.4 10*3/uL (ref 0.1–1.0)
Monocytes Relative: 7 %
Neutro Abs: 4 10*3/uL (ref 1.7–7.7)
Neutrophils Relative %: 79 %
Platelets: 132 10*3/uL — ABNORMAL LOW (ref 150–400)
RBC: 3.75 MIL/uL — ABNORMAL LOW (ref 3.87–5.11)
RDW: 14.1 % (ref 11.5–15.5)
WBC: 5.1 10*3/uL (ref 4.0–10.5)
nRBC: 0 % (ref 0.0–0.2)

## 2020-05-04 LAB — VITAMIN B12: Vitamin B-12: 1844 pg/mL — ABNORMAL HIGH (ref 180–914)

## 2020-05-04 LAB — CMP (CANCER CENTER ONLY)
ALT: <6 U/L (ref 0–44)
AST: 11 U/L — ABNORMAL LOW (ref 15–41)
Albumin: 3.5 g/dL (ref 3.5–5.0)
Alkaline Phosphatase: 74 U/L (ref 38–126)
Anion gap: 9 (ref 5–15)
BUN: 11 mg/dL (ref 6–20)
CO2: 27 mmol/L (ref 22–32)
Calcium: 9.5 mg/dL (ref 8.9–10.3)
Chloride: 103 mmol/L (ref 98–111)
Creatinine: 0.76 mg/dL (ref 0.44–1.00)
GFR, Estimated: >60 mL/min (ref >60)
Glucose, Bld: 87 mg/dL (ref 70–99)
Potassium: 4.2 mmol/L (ref 3.5–5.1)
Sodium: 139 mmol/L (ref 135–145)
Total Bilirubin: 1 mg/dL (ref 0.3–1.2)
Total Protein: 7.9 g/dL (ref 6.5–8.1)

## 2020-05-04 LAB — SEDIMENTATION RATE: Sed Rate: 53 mm/hr — ABNORMAL HIGH (ref 0–22)

## 2020-05-04 NOTE — Progress Notes (Signed)
HEMATOLOGY/ONCOLOGY CONSULTATION NOTE  Date of Service: 05/05/2020  Patient Care Team: Ladell Pier, MD as PCP - General (Internal Medicine)  CHIEF COMPLAINTS/PURPOSE OF CONSULTATION:  Anemia  HISTORY OF PRESENTING ILLNESS:   [name] is a wonderful 50 y.o. female who has been referred to [place] by Dr. [name] for evaluation and management of anemia. The pt reports that she is doing well overall.   The pt reports that she has had anemia since birth but is unaware of any genetic condition causing her anemia. She received IV iron for about 4 years but stopped 2 years ago when she moved to [place]. Pt had a Port-a-Cath placed for repeat IV Iron Infusions due to issues accessing her veins.   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 She has been on anticoagulation for nearly 10 years and denies any repeat blood clots since starting Eliquis. Her PCP is currently managing her anticoagulation. 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 The ulcer tends to heal in the winter and reopen in the summer. It is currently healed with no open area.  Pt had a Colonoscopy and EGD at [facility] in [place] in August of 2021 that were benign. She was experiencing bloody stools frequently but hasn't had any after her 2021 GI studies. She has discontinued Aspirin due to concerns for excessive blood loss.   Pt has had a bilateral oophorectomies. She does not recall being diagnosed with PCOS. She denies gluten intolerance or any thyroid dysfunction. Pt had a fall in 2019 that is causing ongong back pain. She was seen after the fall and was found to have injury at L1 & L2. She has been given Tylenol for pain management. Pt has a history of chronic acid suppression. She had stopped but recently restarted Prilosec. She was Vitamin B12 deficient for about one year. Pt had a heart  attack in but denies any history of liver disease. Pt has had her COVID19 vaccines and booster.  Most recent lab results  of CBC w/diff is as follows: all values are WNL except for RBC at 3.80, Hgb at 10.1, HCT at 35.3, MCHC at 28.6, RDW at 16.2, PLT at 148K.  On review of systems, pt reports fatigue and denies nose bleeds, gum bleeds, bloody stools, heavy menstrual losses, fevers, unexpected weight loss, chills, new back pain, abdominal pain and any other symptoms.   On PMHx the pt reports B/L Oophorectomy, Myocardial infarction, DVT, Iron deficiency anemia due to chronic blood loss.   MEDICAL HISTORY:  Past Medical History:  Diagnosis Date  . CAD (coronary artery disease)    PT STATES - HAD A CARDIAC CATH - NOT TOLD SHE HAD CAD -> week note from [place] indicates history of MI (patient cannot corroborate  . Cellulitis   . CHF (congestive heart failure) ([place])   . 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 Generalized anxiety disorder   . H/O gastric ulcer 11/16/2018  . History of small bowel obstruction    In childhood  . Hypertension   . Iron deficiency anemia due to chronic blood loss    Previously been followed by hematology for iron infusion every 2 weeks and as of 2019; full GI evaluation including capsule endoscopy negative.  . Morbid obesity due to excess calories ([place])   . Osteoarthritis  of left knee   . Prediabetes   . Small bowel obstruction ([place])    as a child  . Speech impediment    Stutter / stammer    SURGICAL HISTORY: Past Surgical History:  Procedure Laterality Date  . BILATERAL OOPHORECTOMY  08/06/2017  . Exploratory laparoscopy  02/13/2018   Release of SBO    SOCIAL HISTORY: Social History   Socioeconomic History  . Marital status: Single    Spouse name: Not on file  . Number of children: 0  . Years of education: Not on file  . Highest education level: 12th grade  Occupational  History  . Occupation: unemployed on [place] Use  . Smoking status: Former Research scientist (life sciences)  . Smokeless tobacco: Never Used  Vaping Use  . Vaping Use: Never used  Substance and Sexual Activity  . Alcohol use: Not Currently  . Drug use: Not Currently  . Sexual activity: Not Currently  Other Topics Concern  . Not on file  Social History Narrative  . Not on file   Social Determinants of Health   Financial Resource Strain: Not on file  Food Insecurity: Not on file  Transportation Needs: Not on file  Physical Activity: Not on file  Stress: Not on file  Social Connections: Not on file  Intimate Partner Violence: Not on file    FAMILY HISTORY: Family History  Problem Relation Age of Onset  . Diabetes Mellitus II Mother   . COPD Father   . Diabetes Father   . Diabetes Mellitus II Maternal Grandmother   . Breast cancer Paternal Grandfather     ALLERGIES:  is allergic to ace inhibitors, aspirin, hydromorphone, vancomycin, contrast media [iodinated diagnostic agents], and dilaudid [hydromorphone hcl].  MEDICATIONS:  Current Outpatient Medications  Medication Sig Dispense Refill  . acetaminophen (TYLENOL) 500 MG tablet Take 1 tablet (500 mg total) by mouth every 6 (six) hours as needed. 60 tablet 0  . albuterol (VENTOLIN HFA) 108 (90 Base) MCG/ACT inhaler Inhale 2 puffs into the lungs every 6 (six) hours as needed for wheezing or shortness of breath. 8 g 2  . amLODipine (NORVASC) 5 MG tablet Take 0.5 tablets (2.5 mg total) by mouth daily. 15 tablet 6  . apixaban (ELIQUIS) 5 MG TABS tablet Take 1 tablet (5 mg total) by mouth 2 (two) times daily. 60 tablet 3  . atorvastatin (LIPITOR) 40 MG tablet Take 1 tablet (40 mg total) by mouth daily. 30 tablet 6  . busPIRone (BUSPAR) 15 MG tablet Take 15 mg by mouth daily.    . carvedilol (COREG) 25 MG tablet Take 1 tablet (25 mg total) by mouth 2 (two) times daily with a meal. 60 tablet 6  . cholecalciferol (VITAMIN D3) 25 MCG (1000 UNIT)  tablet Take 1 tablet (1,000 Units total) by mouth daily. 30 tablet 5  . fluticasone (FLOVENT HFA) 110 MCG/ACT inhaler Inhale 2 puffs into the lungs daily.    . furosemide (LASIX) 80 MG tablet Take 1 tablet (80 mg total) by mouth daily. 30 tablet 3  . omeprazole (PRILOSEC) 20 MG capsule Take 20 mg by mouth daily.    [name] PARoxetine (PAXIL) 10 MG tablet Take 1 tablet (10 mg total) by mouth daily. 30 tablet 6  . potassium chloride SA (KLOR-CON) 20 MEQ tablet Take 1 tablet (20 mEq total) by mouth daily. 30 tablet 6  . spironolactone (ALDACTONE) 25 MG tablet Take 1 tablet (25 mg total) by mouth daily. 30 tablet 6  . valsartan (DIOVAN)  80 MG tablet Take 1 tablet (80 mg total) by mouth daily. 90 tablet 3  . vitamin B-12 (CYANOCOBALAMIN) 1000 MCG tablet Take 1,000 mcg by mouth daily.     No current facility-administered medications for this visit.    REVIEW OF SYSTEMS:    10 Point review of Systems was done is negative except as noted above.  PHYSICAL EXAMINATION: ECOG PERFORMANCE STATUS: 2 - Symptomatic, <50% confined to bed  . Vitals:   05/04/20 1328  BP: 126/70  Pulse: 76  Resp: 17  Temp: 97.8 F (36.6 C)  SpO2: 93%   Filed Weights   05/04/20 1328  Weight: (!) 327 lb 11.2 oz (148.6 kg)   .Body mass index is 52.89 kg/m.   GENERAL:alert, in no acute distress and comfortable SKIN: no acute rashes, no significant lesions EYES: conjunctiva are pink and non-injected, sclera anicteric OROPHARYNX: MMM, no exudates, no oropharyngeal erythema or ulceration NECK: supple, no JVD LYMPH:  no palpable lymphadenopathy in the cervical, axillary or inguinal regions LUNGS: clear to auscultation b/l with normal respiratory effort HEART: regular rate & rhythm ABDOMEN:  normoactive bowel sounds , non tender, not distended. Extremity: 2+ pedal edema b/l PSYCH: alert & oriented x 3 with fluent speech NEURO: no focal motor/sensory deficits  LABORATORY DATA:  I have reviewed the data as  listed  . CBC Latest Ref Rng & Units 05/04/2020 02/08/2020 01/20/2020  WBC 4.0 - 10.5 K/uL 5.1 5.0 5.8  Hemoglobin 12.0 - 15.0 g/dL 9.8(L) 10.1(L) 10.1(L)  Hematocrit 36.0 - 46.0 % 34.0(L) 35.3(L) 35.2(L)  Platelets 150 - 400 K/uL 132(L) 148(L) 151   . CBC    Component Value Date/Time   WBC 5.1 05/04/2020 1517   RBC 3.75 (L) 05/04/2020 1517   HGB 9.8 (L) 05/04/2020 1517   HCT 34.0 (L) 05/04/2020 1517   PLT 132 (L) 05/04/2020 1517   MCV 90.7 05/04/2020 1517   MCH 26.1 05/04/2020 1517   MCHC 28.8 (L) 05/04/2020 1517   RDW 14.1 05/04/2020 1517   LYMPHSABS 0.7 05/04/2020 1517   MONOABS 0.4 05/04/2020 1517   EOSABS 0.1 05/04/2020 1517   BASOSABS 0.0 05/04/2020 1517    . CMP Latest Ref Rng & Units 05/04/2020 01/20/2020  Glucose 70 - 99 mg/dL 87 98  BUN 6 - 20 mg/dL 11 9  Creatinine 0.44 - 1.00 mg/dL 0.76 0.75  Sodium 135 - 145 mmol/L 139 140  Potassium 3.5 - 5.1 mmol/L 4.2 3.6  Chloride 98 - 111 mmol/L 103 103  CO2 22 - 32 mmol/L 27 28  Calcium 8.9 - 10.3 mg/dL 9.5 9.3  Total Protein 6.5 - 8.1 g/dL 7.9 7.5  Total Bilirubin 0.3 - 1.2 mg/dL 1.0 1.3(H)  Alkaline Phos 38 - 126 U/L 74 74  AST 15 - 41 U/L 11(L) 16  ALT 0 - 44 U/L <6 11    RADIOGRAPHIC STUDIES: I have personally reviewed the radiological images as listed and agreed with the findings in the report. DG Chest 2 View  Result Date: 05/04/2020 CLINICAL DATA:  Port-A-Cath positioning confirmation. EXAM: CHEST - 2 VIEW COMPARISON:  01/21/2020 FINDINGS: A right jugular Port-A-Cath remains in place terminating near the superior cavoatrial junction. The cardiac silhouette remains mildly enlarged. The lungs are mildly hypoinflated. No confluent airspace opacity, edema, pleural effusion, or pneumothorax is identified. No acute osseous abnormality is seen. IMPRESSION: Port-A-Cath appears appropriately positioned. No evidence of active cardiopulmonary disease. Electronically Signed   By: Logan Bores M.D.   On: 05/04/2020 16:14  ASSESSMENT & PLAN:   50 yo with   1) Chronic iron deficiency anemia requiring recurrent IV Iron infusions. Likely from chronic GI losses and previously due to blood loss from venous stasis ulcer in the setting of chronic anticoagulation. 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 Patient Active Problem List   Diagnosis Date Noted  . 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 Venous insufficiency 04/13/2020  . Presence of IVC filter 04/13/2020  . Chronic anemia 04/13/2020  . Resistant hypertension 04/13/2020  . (HFpEF) heart failure with preserved ejection fraction ([place]) 04/13/2020   PLAN: -Discussed patient's most recent labs from 02/08/2020, all values are WNL except for RBC at 3.80, Hgb at 10.1, HCT at 35.3, MCHC at 28.6, RDW at 16.2, PLT at 148K. -No over GI blood loss reported -Advised pt that her Port-a-cath should be flushed every 2-3 months. -Recommend pt f/u with PCP for back pain management. -Will get Chest XR today to confirm port placement (CXR reviewed -- appropriately positioned) -Will get labs today  -Will see back in 2 weeks via phone    FOLLOW UP: Labs today CXR today Phone visit with Dr Irene Limbo in 2 weeks  . Orders Placed This Encounter  Procedures  . DG Chest 2 View    Standing Status:   Future    Number of Occurrences:   1    Standing Expiration Date:   05/04/2021    Order Specific Question:   Reason for Exam (SYMPTOM  OR DIAGNOSIS REQUIRED)    Answer:   confirmation of appropriate port a cath positioning    Order Specific Question:   Is patient pregnant?    Answer:   No    Order Specific Question:   Preferred imaging location?    Answer:   [facility]  . CBC with Differential/Platelet    Standing Status:   Future    Number of Occurrences:   1     Standing Expiration Date:   05/04/2021  . CMP ([place] only)    Standing Status:   Future    Number of Occurrences:   1    Standing Expiration Date:   05/04/2021  . Ferritin    Standing Status:   Future    Number of Occurrences:   1    Standing Expiration Date:   05/04/2021  . Iron and TIBC    Standing Status:   Future    Number of Occurrences:   1    Standing Expiration Date:   05/04/2021  . Sedimentation rate    Standing Status:   Future    Number of Occurrences:   1    Standing Expiration Date:   05/04/2021  . Erythropoietin    Standing Status:   Future    Number of Occurrences:   1    Standing Expiration Date:   05/04/2021  . Vitamin B12    Standing Status:   Future    Number of Occurrences:   1    Standing Expiration Date:   05/04/2021  . Anti-parietal antibody    Standing Status:   Future    Number of Occurrences:   1    Standing Expiration Date:   05/04/2021  . Intrinsic factor antibodies    Standing Status:   Future    Number  of Occurrences:   1    Standing Expiration Date:   05/04/2021    All of the patients questions were answered with apparent satisfaction. The patient knows to call the clinic with any problems, questions or concerns.  I spent 35 mins counseling the patient face to face. The total time spent in the appointment was 45 minutes and more than 50% was on counseling and direct patient cares.    Sullivan Lone MD [place] AAHIVMS [facility] Hematology/Oncology Physician [facility]  (Office):       [phone] (Work cell):  [phone] (Fax):           [phone]  05/05/2020 3:39 PM  I, Yevette Edwards, am acting as a scribe for Dr. Sullivan Lone.   .I have reviewed the above documentation for accuracy and completeness, and I agree with the above. Brunetta Genera MD    ADDENDUM  Component     Latest Ref Rng & Units 05/04/2020  Iron     41 - 142 ug/dL 52  TIBC     236 - 444 ug/dL 363  Saturation Ratios     21 - 57 % 14 (L)  UIBC     120  - 384 ug/dL 310  Intrinsic Factor     0.0 - 1.1 AU/mL 1.0  Parietal Cell Antibody-IgG     0.0 - 20.0 Units 10.8  Vitamin B12     180 - 914 pg/mL 1,844 (H)  Erythropoietin     2.6 - 18.5 mIU/mL 40.3 (H)  Sed Rate     0 - 22 mm/hr 53 (H)  Ferritin     11 - 307 ng/mL 24    PLan -will setup patient for IV Injectafer weekly x 2 doses and RTC with Dr Irene Limbo with labs in 3 months.

## 2020-05-05 ENCOUNTER — Telehealth: Payer: Self-pay | Admitting: Internal Medicine

## 2020-05-05 ENCOUNTER — Telehealth: Payer: Self-pay | Admitting: Hematology

## 2020-05-05 ENCOUNTER — Telehealth: Payer: Self-pay | Admitting: Licensed Clinical Social Worker

## 2020-05-05 LAB — IRON AND TIBC
Iron: 52 ug/dL (ref 41–142)
Saturation Ratios: 14 % — ABNORMAL LOW (ref 21–57)
TIBC: 363 ug/dL (ref 236–444)
UIBC: 310 ug/dL (ref 120–384)

## 2020-05-05 LAB — ERYTHROPOIETIN: Erythropoietin: 40.3 m[IU]/mL — ABNORMAL HIGH (ref 2.6–18.5)

## 2020-05-05 LAB — ANTI-PARIETAL ANTIBODY: Parietal Cell Antibody-IgG: 10.8 Units (ref 0.0–20.0)

## 2020-05-05 LAB — FERRITIN: Ferritin: 24 ng/mL (ref 11–307)

## 2020-05-05 LAB — INTRINSIC FACTOR ANTIBODIES: Intrinsic Factor: 1 AU/mL (ref 0.0–1.1)

## 2020-05-05 NOTE — Telephone Encounter (Signed)
Will forward to pcp

## 2020-05-05 NOTE — Telephone Encounter (Signed)
Patient is calling to report that acetaminophen (TYLENOL) 500 MG tablet [[id number]]  Is not working. Patient is requesting something stronger or another medication. Please advise CB- [phone]

## 2020-05-05 NOTE — Telephone Encounter (Signed)
Scheduled per 01/06 los, patient has been called and notified.

## 2020-05-05 NOTE — Telephone Encounter (Signed)
Completed [place] application faxed to eligibility department. Pt was contacted and notified.  Pt would like PCP to adjust medications for back pain. Message regarding request was routed to PCP earlier today.

## 2020-05-10 ENCOUNTER — Telehealth: Payer: Self-pay | Admitting: *Deleted

## 2020-05-10 ENCOUNTER — Other Ambulatory Visit: Payer: Self-pay | Admitting: *Deleted

## 2020-05-10 DIAGNOSIS — D500 Iron deficiency anemia secondary to blood loss (chronic): Secondary | ICD-10-CM

## 2020-05-10 DIAGNOSIS — D649 Anemia, unspecified: Secondary | ICD-10-CM

## 2020-05-10 NOTE — Telephone Encounter (Signed)
Contacted patient regarding test results per [name] directions: Please let [name] know her labs show mild anemia hgb 9.8.Marland KitchenMarland Kitchenwith iron deficiency.  Would recommend IV Iron as she has been getting previously. If she is ok with this --- can send scheduling msg for schedule IV Injectafer weekly x 2. Cancel MD phone visit on 1/20.  RTC with Dr Irene Limbo in 3 months (~ 4/12)  with labs      Patient in agreement with plan - schedule message sent

## 2020-05-11 ENCOUNTER — Telehealth: Payer: Self-pay | Admitting: Cardiology

## 2020-05-11 ENCOUNTER — Ambulatory Visit: Payer: Self-pay | Admitting: *Deleted

## 2020-05-11 ENCOUNTER — Other Ambulatory Visit: Payer: Self-pay

## 2020-05-11 ENCOUNTER — Emergency Department (HOSPITAL_COMMUNITY)
Admission: EM | Admit: 2020-05-11 | Discharge: 2020-05-12 | Disposition: A | Discharge status: Home / Self Care | Payer: Medicaid Other | Attending: Emergency Medicine | Admitting: Emergency Medicine

## 2020-05-11 DIAGNOSIS — Z7901 Long term (current) use of anticoagulants: Secondary | ICD-10-CM | POA: Insufficient documentation

## 2020-05-11 DIAGNOSIS — Z87891 Personal history of nicotine dependence: Secondary | ICD-10-CM | POA: Insufficient documentation

## 2020-05-11 DIAGNOSIS — I110 Hypertensive heart disease with heart failure: Secondary | ICD-10-CM | POA: Insufficient documentation

## 2020-05-11 DIAGNOSIS — Z79899 Other long term (current) drug therapy: Secondary | ICD-10-CM | POA: Diagnosis not present

## 2020-05-11 DIAGNOSIS — I509 Heart failure, unspecified: Secondary | ICD-10-CM | POA: Insufficient documentation

## 2020-05-11 DIAGNOSIS — R799 Abnormal finding of blood chemistry, unspecified: Secondary | ICD-10-CM | POA: Insufficient documentation

## 2020-05-11 DIAGNOSIS — I2510 Atherosclerotic heart disease of native coronary artery without angina pectoris: Secondary | ICD-10-CM | POA: Insufficient documentation

## 2020-05-11 DIAGNOSIS — R5383 Other fatigue: Secondary | ICD-10-CM | POA: Insufficient documentation

## 2020-05-11 LAB — CBC
HCT: 34.5 % — ABNORMAL LOW (ref 36.0–46.0)
Hemoglobin: 10.1 g/dL — ABNORMAL LOW (ref 12.0–15.0)
MCH: 26.5 pg (ref 26.0–34.0)
MCHC: 29.3 g/dL — ABNORMAL LOW (ref 30.0–36.0)
MCV: 90.6 fL (ref 80.0–100.0)
Platelets: 165 10*3/uL (ref 150–400)
RBC: 3.81 MIL/uL — ABNORMAL LOW (ref 3.87–5.11)
RDW: 14.5 % (ref 11.5–15.5)
WBC: 6.3 10*3/uL (ref 4.0–10.5)
nRBC: 0 % (ref 0.0–0.2)

## 2020-05-11 LAB — COMPREHENSIVE METABOLIC PANEL
ALT: 10 U/L (ref 0–44)
AST: 14 U/L — ABNORMAL LOW (ref 15–41)
Albumin: 3.4 g/dL — ABNORMAL LOW (ref 3.5–5.0)
Alkaline Phosphatase: 71 U/L (ref 38–126)
Anion gap: 9 (ref 5–15)
BUN: 9 mg/dL (ref 6–20)
CO2: 30 mmol/L (ref 22–32)
Calcium: 9.2 mg/dL (ref 8.9–10.3)
Chloride: 100 mmol/L (ref 98–111)
Creatinine, Ser: 0.92 mg/dL (ref 0.44–1.00)
GFR, Estimated: >60 mL/min (ref >60)
Glucose, Bld: 92 mg/dL (ref 70–99)
Potassium: 3.9 mmol/L (ref 3.5–5.1)
Sodium: 139 mmol/L (ref 135–145)
Total Bilirubin: 0.9 mg/dL (ref 0.3–1.2)
Total Protein: 7.7 g/dL (ref 6.5–8.1)

## 2020-05-11 LAB — URINALYSIS, ROUTINE W REFLEX MICROSCOPIC
Bilirubin Urine: NEGATIVE
Glucose, UA: NEGATIVE mg/dL
Hgb urine dipstick: NEGATIVE
Ketones, ur: NEGATIVE mg/dL
Leukocytes,Ua: NEGATIVE
Nitrite: NEGATIVE
Protein, ur: NEGATIVE mg/dL
Specific Gravity, Urine: 1.023 (ref 1.005–1.030)
pH: 5 (ref 5.0–8.0)

## 2020-05-11 LAB — LIPASE, BLOOD: Lipase: 27 U/L (ref 11–51)

## 2020-05-11 NOTE — Telephone Encounter (Signed)
FYI

## 2020-05-11 NOTE — Telephone Encounter (Signed)
Called pt concerning her low iron levels.  Pt states that someone had called her to set up infusions for her after reporting to her a low hgb and iron levels.  Upon research in the chart it appears that her oncology placed the orders and a nurse from that office had called them with results. Instructed pt that she would have to call that office to schedule infusions, etc. Provided phone number for the oncology office. Pt seems slightly confused at the time, per her chart it appears that she also called another triage line with this issue as well.

## 2020-05-11 NOTE — ED Triage Notes (Signed)
Patient told by hematologist to go to ED if she feels bad. Patient had blood drawn recently and was anemic with Hgb at 9.8 on 1/10. Patient states when her lab values reach this point she receives an iron and blood transfusion.

## 2020-05-11 NOTE — Telephone Encounter (Signed)
Patient is calling to report her hemoglobin is low- patient is complaining of fatigue.Patient was seen by hematology and notified of lab results yesterday. Patient was told she  needs iron infusion-- but has not heard back to schedule.Advsied patient to call oncology dept and let them know that she has not heard anything from scheduling for her infusion and she is having a lot of fatigue. Advised patient to call back if she has trouble getting scheduled.  Reason for Disposition . [1] MODERATE weakness (i.e., interferes with work, school, normal activities) AND [2] persists > 3 days  Answer Assessment - Initial Assessment Questions 1. DESCRIPTION: "Describe how you are feeling."     tired 2. SEVERITY: "How bad is it?"  "Can you stand and walk?"   - MILD - Feels weak or tired, but does not interfere with work, school or normal activities   - [place] to stand and walk; weakness interferes with work, school, or normal activities   - SEVERE - Unable to stand or walk     moderate 3. ONSET:  "When did the weakness begin?"     Patient is under treatment for low iron levels 4. CAUSE: "What do you think is causing the weakness?"     anemia 5. MEDICINES: "Have you recently started a new medicine or had a change in the amount of a medicine?"     Iron infusions 6. OTHER SYMPTOMS: "Do you have any other symptoms?" (e.g., chest pain, fever, cough, SOB, vomiting, diarrhea, bleeding, other areas of pain)     fatigue 7. PREGNANCY: "Is there any chance you are pregnant?" "When was your last menstrual period?"     n/a  Protocols used: WEAKNESS (GENERALIZED) AND FATIGUE-A-AH

## 2020-05-11 NOTE — Telephone Encounter (Signed)
Patient called and stated that from the results, she has very low iron. Was told to contact Dr. Allison Quarry office to schedule appt asap. Dr. Ellyn Hack currently does not have any openings. Stated that she needs a nurse to call her back asap to see what she needs to do. Please call back

## 2020-05-12 ENCOUNTER — Telehealth: Payer: Self-pay

## 2020-05-12 ENCOUNTER — Telehealth: Payer: Self-pay | Admitting: Hematology

## 2020-05-12 NOTE — Telephone Encounter (Signed)
Returned pt call to go over Dr. [name] message pt is aware. Pt states she has an appt scheduled for 1/20

## 2020-05-12 NOTE — Telephone Encounter (Signed)
Reason for CRM: Patient was hospital on yesterday for low iron and was told by the ER doctors to get her PCP to get her an appointment for an iron infusion. She states that her iron level is at a 14. She wants a call back. She can be reached at [phone]. Please advise

## 2020-05-12 NOTE — Telephone Encounter (Signed)
Scheduled apt per 1/12 sch msg - pt is aware of appts scheduled.

## 2020-05-12 NOTE — Discharge Instructions (Addendum)
Your blood work shows that your hemoglobin is actually improved from the other day.  Today it is 10.1.  Otherwise your vital signs are reassuring.  Also your lab work is normal.  Unknown cause of your fatigue I do think this could be due to your low iron however I do not think that you needed a blood transfusion at this time.  I do think that you to follow-up with your primary care and oncologist in the outpatient setting to set up the iron infusions.  If you develop any chest pain, shortness of breath, dizziness, blood in your stool or any new symptoms I would return back to the emergency department.

## 2020-05-13 NOTE — ED Provider Notes (Signed)
[name] EMERGENCY DEPARTMENT Provider Note   CSN: [id number] Arrival date & time: 05/11/20  1641     History Chief Complaint  Patient presents with  . Abnormal Lab    [name] is a 50 y.o. female.  HPI 50 yo with hx of chronic anemia presents to the ed for eval of fatigue and low hgb and iron. Pt reports she saw her hematologist recently who did blood work. She was told it was low and she reports fatigue. Pt is on blood thinners. She denies any blood in stool. Pt reports low iron and has gotten regular iron infusion in past but has not had it recently. She was told that if her fatigue worsened to go to the ed by her doctor. Pt denies any cp or sob. No dizziness or lightheadedness. No syncope. Pt requesting blood transfusion and iron infusion in the ed.    Past Medical History:  Diagnosis Date  . CAD (coronary artery disease)    PT STATES - HAD A CARDIAC CATH - NOT TOLD SHE HAD CAD -> week note from [place] indicates history of MI (patient cannot corroborate  . Cellulitis   . CHF (congestive heart failure) ([place])   . 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 Generalized anxiety disorder   . H/O gastric ulcer 11/16/2018  . History of small bowel obstruction    In childhood  . Hypertension   . Iron deficiency anemia due to chronic blood loss    Previously been followed by hematology for iron infusion every 2 weeks and as of 2019; full GI evaluation including capsule endoscopy negative.  . Morbid obesity due to excess calories ([place])   . Osteoarthritis of left knee   . Prediabetes   . Small bowel obstruction ([place])    as a child  . Speech impediment    Stutter / stammer    Patient Active Problem List   Diagnosis Date Noted  . Iron deficiency anemia due to chronic blood loss 05/10/2020  . 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 Venous insufficiency 04/13/2020  . Presence of IVC filter 04/13/2020  . Chronic anemia 04/13/2020  . Resistant hypertension 04/13/2020  . (HFpEF) heart failure with preserved ejection fraction ([place]) 04/13/2020    Past Surgical History:  Procedure Laterality Date  . BILATERAL OOPHORECTOMY  08/06/2017  . Exploratory laparoscopy  02/13/2018   Release of SBO     OB History   No obstetric history on file.     Family History  Problem Relation Age of Onset  . Diabetes Mellitus II Mother   . COPD Father   . Diabetes Father   . Diabetes Mellitus II Maternal Grandmother   . Breast cancer Paternal Grandfather     Social History   Tobacco Use  . Smoking status: Former Research scientist (life sciences)  . Smokeless tobacco: Never Used  Vaping Use  . Vaping Use: Never used  Substance Use Topics  . Alcohol use: Not Currently  . Drug use: Not Currently    Home Medications Prior to Admission medications   Medication Sig Start Date End Date Taking? Authorizing Provider  acetaminophen (TYLENOL) 500 MG tablet Take 1 tablet (500 mg total) by mouth every 6 (six) hours as needed. 04/19/20   Ladell Pier, MD  albuterol (VENTOLIN HFA) 108 (90 Base) MCG/ACT inhaler Inhale 2 puffs into the lungs every  6 (six) hours as needed for wheezing or shortness of breath. 04/07/20   Ladell Pier, MD  amLODipine (NORVASC) 5 MG tablet Take 0.5 tablets (2.5 mg total) by mouth daily. 04/07/20   Ladell Pier, MD  apixaban (ELIQUIS) 5 MG TABS tablet Take 1 tablet (5 mg total) by mouth 2 (two) times daily. 04/07/20   Ladell Pier, MD  atorvastatin (LIPITOR) 40 MG tablet Take 1 tablet (40 mg total) by mouth daily. 04/07/20   Ladell Pier, MD  busPIRone (BUSPAR) 15 MG tablet Take 15 mg by mouth daily. 12/15/19   [provider]  carvedilol (COREG) 25 MG tablet Take 1 tablet (25 mg total) by mouth 2 (two) times daily with a meal. 04/07/20   Ladell Pier, MD  cholecalciferol  (VITAMIN D3) 25 MCG (1000 UNIT) tablet Take 1 tablet (1,000 Units total) by mouth daily. 04/07/20   Ladell Pier, MD  fluticasone (FLOVENT HFA) 110 MCG/ACT inhaler Inhale 2 puffs into the lungs daily. 12/15/19   [provider]  furosemide (LASIX) 80 MG tablet Take 1 tablet (80 mg total) by mouth daily. 04/07/20   Ladell Pier, MD  omeprazole (PRILOSEC) 20 MG capsule Take 20 mg by mouth daily. 12/15/19   [provider]  PARoxetine (PAXIL) 10 MG tablet Take 1 tablet (10 mg total) by mouth daily. 04/07/20   Ladell Pier, MD  potassium chloride SA (KLOR-CON) 20 MEQ tablet Take 1 tablet (20 mEq total) by mouth daily. 04/07/20   Ladell Pier, MD  spironolactone (ALDACTONE) 25 MG tablet Take 1 tablet (25 mg total) by mouth daily. 04/07/20   Ladell Pier, MD  valsartan (DIOVAN) 80 MG tablet Take 1 tablet (80 mg total) by mouth daily. 04/13/20   Leonie Man, MD  vitamin B-12 (CYANOCOBALAMIN) 1000 MCG tablet Take 1,000 mcg by mouth daily.    [provider]    Allergies    Ace inhibitors, Aspirin, Hydromorphone, Vancomycin, Contrast media [iodinated diagnostic agents], and Dilaudid [hydromorphone hcl]  Review of Systems   Review of Systems  Constitutional: Negative for chills and fever.  HENT: Negative for congestion.   Eyes: Negative for discharge.  Respiratory: Negative for shortness of breath.   Cardiovascular: Negative for chest pain.  Gastrointestinal: Negative for vomiting.  Genitourinary: Negative for difficulty urinating.  Skin: Negative for pallor.  Neurological: Negative for dizziness, syncope and light-headedness.  Psychiatric/Behavioral: Negative for confusion.    Physical Exam Updated Vital Signs BP 116/78   Pulse 75   Temp 97.7 F (36.5 C) (Oral)   Resp 14   Ht 5\' 6"  (1.676 m)   Wt (!) 150.6 kg   SpO2 100%   BMI 53.59 kg/m   Physical Exam Vitals and nursing note reviewed.  Constitutional:      General: She  is not in acute distress.    Appearance: She is well-developed and well-nourished. She is not ill-appearing or toxic-appearing.  HENT:     Head: Normocephalic and atraumatic.     Mouth/Throat:     Mouth: Mucous membranes are moist.  Eyes:     General: No scleral icterus.       Right eye: No discharge.        Left eye: No discharge.     Conjunctiva/sclera: Conjunctivae normal.     Pupils: Pupils are equal, round, and reactive to light.  Cardiovascular:     Rate and Rhythm: Normal rate and regular rhythm.  Pulses: Normal pulses.  Pulmonary:     Effort: Pulmonary effort is normal. No respiratory distress.     Breath sounds: Normal breath sounds.  Musculoskeletal:        General: Normal range of motion.     Cervical back: Normal range of motion.  Skin:    Capillary Refill: Capillary refill takes less than 2 seconds.     Coloration: Skin is not pale.  Neurological:     Mental Status: She is alert.  Psychiatric:        Behavior: Behavior normal.        Thought Content: Thought content normal.        Judgment: Judgment normal.     ED Results / Procedures / Treatments   Labs (all labs ordered are listed, but only abnormal results are displayed) Labs Reviewed  COMPREHENSIVE METABOLIC PANEL - Abnormal; Notable for the following components:      Result Value   Albumin 3.4 (*)    AST 14 (*)    All other components within normal limits  CBC - Abnormal; Notable for the following components:   RBC 3.81 (*)    Hemoglobin 10.1 (*)    HCT 34.5 (*)    MCHC 29.3 (*)    All other components within normal limits  URINALYSIS, ROUTINE W REFLEX MICROSCOPIC - Abnormal; Notable for the following components:   APPearance HAZY (*)    All other components within normal limits  LIPASE, BLOOD    EKG None  Radiology No results found.  Procedures Procedures (including critical care time)  Medications Ordered in ED Medications - No data to display  ED Course  I have reviewed the  triage vital signs and the nursing notes.  Pertinent labs & imaging results that were available during my care of the patient were reviewed by me and considered in my medical decision making (see chart for details).    MDM Rules/Calculators/A&P                          Pt with fatigue and hx of chronic iron deficient anemia. Pt requesting blood and iron infusion. Pt vs are reassuring. She is not orthostatic. Pt hgb improved from prior and at her baseline of 10. Pt is hds. ua without infection. No sig electrolyte abnormality. Pt denies cp or sob. Since hgb is 10 do not feel she needs transfusion. I do think that he may need iron infusion but not done emergently in ed. Pt to follow up with pcp/hematologist in the outpatient setting.  Pt is hemodynamically stable, in NAD, & able to ambulate in the ED. Evaluation does not show pathology that would require ongoing emergent intervention or inpatient treatment. I explained the diagnosis to the patient. Pain has been managed & has no complaints prior to dc. Pt is comfortable with above plan and is stable for discharge at this time. All questions were answered prior to disposition. Strict return precautions for f/u to the ED were discussed. Encouraged follow up with PCP.  Final Clinical Impression(s) / ED Diagnoses Final diagnoses:  Fatigue, unspecified type    Rx / DC Orders ED Discharge Orders    None       Aaron Edelman 05/13/20 1626    Cardama, Grayce Sessions, MD 05/14/20 [phone]

## 2020-05-18 ENCOUNTER — Other Ambulatory Visit: Payer: Self-pay

## 2020-05-18 ENCOUNTER — Ambulatory Visit: Payer: Medicaid Other | Admitting: Hematology

## 2020-05-18 ENCOUNTER — Inpatient Hospital Stay: Payer: Medicaid Other

## 2020-05-18 VITALS — BP 108/76 | HR 84 | Temp 98.0°F | Resp 24

## 2020-05-18 DIAGNOSIS — D500 Iron deficiency anemia secondary to blood loss (chronic): Secondary | ICD-10-CM

## 2020-05-18 DIAGNOSIS — D509 Iron deficiency anemia, unspecified: Secondary | ICD-10-CM | POA: Diagnosis not present

## 2020-05-18 MED ORDER — ACETAMINOPHEN 325 MG PO TABS
650.0000 mg | ORAL_TABLET | Freq: Once | ORAL | Status: AC
  Administered 2020-05-18: 650 mg via ORAL

## 2020-05-18 MED ORDER — SODIUM CHLORIDE 0.9 % IV SOLN
Freq: Once | INTRAVENOUS | Status: AC
  Filled 2020-05-18: qty 250

## 2020-05-18 MED ORDER — LORATADINE 10 MG PO TABS
10.0000 mg | ORAL_TABLET | Freq: Once | ORAL | Status: AC
  Administered 2020-05-18: 10 mg via ORAL

## 2020-05-18 MED ORDER — SODIUM CHLORIDE 0.9 % IV SOLN
750.0000 mg | Freq: Once | INTRAVENOUS | Status: AC
  Administered 2020-05-18: 750 mg via INTRAVENOUS
  Filled 2020-05-18: qty 15

## 2020-05-18 MED ORDER — ACETAMINOPHEN 325 MG PO TABS
ORAL_TABLET | ORAL | Status: AC
  Filled 2020-05-18: qty 2

## 2020-05-18 MED ORDER — HEPARIN SOD (PORK) LOCK FLUSH 100 UNIT/ML IV SOLN
500.0000 [IU] | Freq: Once | INTRAVENOUS | Status: AC
  Administered 2020-05-18: 500 [IU] via INTRAVENOUS
  Filled 2020-05-18: qty 5

## 2020-05-18 MED ORDER — LORATADINE 10 MG PO TABS
ORAL_TABLET | ORAL | Status: AC
  Filled 2020-05-18: qty 1

## 2020-05-18 NOTE — Patient Instructions (Signed)

## 2020-05-19 ENCOUNTER — Encounter (HOSPITAL_BASED_OUTPATIENT_CLINIC_OR_DEPARTMENT_OTHER): Payer: Medicaid Other | Attending: Internal Medicine | Admitting: Internal Medicine

## 2020-05-19 DIAGNOSIS — I87332 Chronic venous hypertension (idiopathic) with ulcer and inflammation of left lower extremity: Secondary | ICD-10-CM | POA: Insufficient documentation

## 2020-05-19 DIAGNOSIS — L97822 Non-pressure chronic ulcer of other part of left lower leg with fat layer exposed: Secondary | ICD-10-CM | POA: Diagnosis present

## 2020-05-19 DIAGNOSIS — I890 Lymphedema, not elsewhere classified: Secondary | ICD-10-CM | POA: Diagnosis not present

## 2020-05-19 NOTE — Progress Notes (Signed)
[name], [name] ([id number]) Visit Report for 05/19/2020 Fall Risk Assessment Details Patient Name: Date of Service: [name], [name] 05/19/2020 10:30 A M Medical Record Number: [id number] Patient Account Number: [id number] Date of Birth/Sex: Treating RN: 23-Jul-1970 (50 y.o. [name], [name] Primary Care Moriah Shawley: [name] Other Clinician: Referring Wakeelah Solan: Treating Emmaclaire Switala/Extender: [place]ra [place] in Treatment: 10 Fall Risk Assessment Items Have you had 2 or more falls in the last 12 monthso 0 No Have you had any fall that resulted in injury in the last 12 monthso 0 Yes FALLS RISK SCREEN History of falling - immediate or within 3 months 25 Yes Secondary diagnosis (Do you have 2 or more medical diagnoseso) 0 No Ambulatory aid None/bed rest/wheelchair/nurse 0 No Crutches/cane/walker 15 Yes Furniture 0 No Intravenous therapy Access/Saline/Heparin Lock 0 No Gait/Transferring Normal/ bed rest/ wheelchair 0 Yes Weak (short steps with or without shuffle, stooped but able to lift head while walking, may seek 0 No support from furniture) Impaired (short steps with shuffle, may have difficulty arising from chair, head down, impaired 0 No balance) Mental Status Oriented to own ability 0 Yes Electronic Signature(s) Signed: 05/19/2020 4:31:03 PM By: Deon Pilling Entered By: Deon Pilling on 05/19/2020 10:46:48

## 2020-05-19 NOTE — Progress Notes (Signed)
[name], [name] ([id number]) Visit Report for 05/19/2020 HPI Details Patient Name: Date of Service: [name], [name] 05/19/2020 10:30 A M Medical Record Number: [id number] Patient Account Number: [id number] Date of Birth/Sex: Treating RN: 27-Aug-1970 (50 y.o. [name] Primary Care Provider: [name] Other Clinician: Referring Provider: Treating Provider/Extender: Nyra Market in Treatment: 10 History of Present Illness HPI Description: ADMISSION 03/10/2020 This is a 50 year old woman who was recently moved to this area from [place] to help look after her mother who is ill. She has a several month history of a wound on the left medial lower leg. She was in the emergency room on 9/23 and 10/12. On both occasions she had x-rays that were negative and she was given doxycycline. On one occasion she had an Haematologist wrap. T the diameter of the wound on 10/12 was 6 cm I do not think a stat large today however her o compression stockings no longer fit because of swelling. Furthermore she tells me she has had external compression pumps but they are not working either. She comes with her last note from her primary doctor in [place] from June. She has a large list of prior diagnoses including iron deficiency anemia, anxiety, asthma, hypertension, coronary artery disease with an MI in 2007, cellulitis in 2019, congestive heart failure, history of a DVT on Eliquis with the advice to stay on this "lifelong", gastric ulcer, chronic blood loss history of an iron infusion given in 2019 with a complete GI work-up negative, osteoarthritis of the left knee, cyst history of a small bowel obstruction, upper GI bleed and prediabetes. She comes in with a long list of medications however the only medications she is taking currently are Eliquis 5 mg twice daily and amlodipine 10 mg daily. She is supposed to be taking atorvastatin, Lasix 80 mg a day, isosorbide  mononitrate 30 mg a day, Klor-Con 20 mEq a day but she does not appear to be taking any of these. She tells me that she has had difficulty getting a primary doctor ABI in our clinic was 1.17 on the left 03/27/2020; patient has a wound on the left anterior lower leg in the setting of chronic lymphedema. The wound margins have been coming in nicely. 12/6; left anterior lower leg wound in the setting of chronic lymphedema. Her wound is contracting. She brought her stockings which are some form of external compression garment I have never really seen these but she said she paid $70 for them in [place] about a year ago 12/13; this is a patient with chronic venous insufficiency and lymphedema. She has a wound on her left medial lower leg that is healed today. She has severe hemosiderin deposition. She has her own 30 mm compression stocking 05/19/2020 This is a patient we discharged a little over a month ago. She has severe chronic venous insufficiency and lymphedema. He had a wound on her left medial lower leg. She had her own 30/40 mm compression stockings. She tells me that she was in the ER a week ago related to iron deficiency anemia. As she was leaving the emergency room she tripped and fell on the curb reopening the wound in the exact same place. She also says her stockings are no longer tight enough and looking at the edema in her leg today that certainly have to agree Electronic Signature(s) Signed: 05/19/2020 4:10:40 PM By: Linton Ham MD Entered By: Linton Ham on 05/19/2020 11:07:10 -------------------------------------------------------------------------------- Physical Exam Details Patient Name:  Date of Service: [name], [name] 05/19/2020 10:30 A M Medical Record Number: [id number] Patient Account Number: [id number] Date of Birth/Sex: Treating RN: Sep 22, 1970 (50 y.o. [name] Primary Care Provider: [name] Other Clinician: Referring Provider: Treating  Provider/Extender: Nyra Market in Treatment: 10 Constitutional Sitting or standing Blood Pressure is within target range for patient.. Supine Blood Pressure is within target range for patient.. Pulse regular and within target range for patient.[name] Respirations regular, non-labored and within target range.[name] Appears in no distress. Cardiovascular Pedal pulses are palpable. Edema present in both extremities. Severe changes of chronic venous insufficiency with secondary lymphedema. Notes Wound exam; the area on the left medial tibia on the lower extremity. The wound surface does not look too bad. Vigorously debrided with wound cleanser and gauze. I did not attempt to mechanically debride this. The edema in her lower extremity is not well controlled Electronic Signature(s) Signed: 05/19/2020 4:10:40 PM By: Linton Ham MD Entered By: Linton Ham on 05/19/2020 11:10:59 -------------------------------------------------------------------------------- Physician Orders Details Patient Name: Date of Service: [name] 05/19/2020 10:30 A M Medical Record Number: [id number] Patient Account Number: [id number] Date of Birth/Sex: Treating RN: May 24, 1970 (50 y.o. [name] Primary Care Provider: [name] Other Clinician: Referring Provider: Treating Provider/Extender: Nyra Market in Treatment: 10 Verbal / Phone Orders: No Diagnosis Coding ICD-10 Coding Code Description [phone] Non-pressure chronic ulcer of other part of left lower leg with fat layer exposed I89.0 Lymphedema, not elsewhere classified I87.332 Chronic venous hypertension (idiopathic) with ulcer and inflammation of left lower extremity Follow-up Appointments ppointment in 2 weeks. - MD visit Return A Nurse Visit: - 1 week Bathing/ Shower/ Hygiene May shower with protection but do not get wound dressing(s) wet. - use cast protector to keep wrap dry  in the shower Edema Control - Lymphedema / SCD / Other Bilateral Lower Extremities Elevate legs to the level of the heart or above for 30 minutes daily and/or when sitting, a frequency of: Avoid standing for long periods of time. Patient to wear own compression stockings every day. - right leg daily Exercise regularly Wound Treatment Wound #2 - Lower Leg Wound Laterality: Left, Medial Peri-Wound Care: Sween Lotion (Moisturizing lotion) 1 x Per Week/30 Days Discharge Instructions: Apply moisturizing lotion as directed Prim Dressing: Hydrofera Blue Classic Foam, 2x2 in 1 x Per Week/30 Days ary Discharge Instructions: Moisten with saline prior to applying to wound bed Secondary Dressing: Woven Gauze Sponge, Non-Sterile 4x4 in 1 x Per Week/30 Days Discharge Instructions: Apply over primary dressing as directed. Secondary Dressing: ABD Pad, 5x9 1 x Per Week/30 Days Discharge Instructions: Apply over primary dressing as directed. Compression Wrap: FourPress (4 layer compression wrap) 1 x Per Week/30 Days Discharge Instructions: Apply four layer compression as directed. Electronic Signature(s) Signed: 05/19/2020 4:10:40 PM By: Linton Ham MD Signed: 05/19/2020 4:31:51 PM By: Baruch Gouty RN, BSN Entered By: Baruch Gouty on 05/19/2020 11:00:09 -------------------------------------------------------------------------------- Problem List Details Patient Name: Date of Service: [name] 05/19/2020 10:30 A M Medical Record Number: [id number] Patient Account Number: [id number] Date of Birth/Sex: Treating RN: 1970-07-03 (50 y.o. [name] Primary Care Provider: [name] Other Clinician: Referring Provider: Treating Provider/Extender: Nyra Market in Treatment: 10 Active Problems ICD-10 Encounter Code Description Active Date MDM Diagnosis [phone] Non-pressure chronic ulcer of other part of left lower leg with fat layer  exposed11/03/2020 No Yes I89.0 Lymphedema, not elsewhere classified 03/10/2020 No Yes I87.332  Chronic venous hypertension (idiopathic) with ulcer and inflammation of left 03/10/2020 No Yes lower extremity Inactive Problems Resolved Problems Electronic Signature(s) Signed: 05/19/2020 4:10:40 PM By: Linton Ham MD Entered By: Linton Ham on 05/19/2020 11:03:54 -------------------------------------------------------------------------------- Progress Note Details Patient Name: Date of Service: [name], [name] 05/19/2020 10:30 A M Medical Record Number: [id number] Patient Account Number: [id number] Date of Birth/Sex: Treating RN: Dec 04, 1970 (50 y.o. [name] Primary Care Provider: [name] Other Clinician: Referring Provider: Treating Provider/Extender: Nyra Market in Treatment: 10 Subjective History of Present Illness (HPI) ADMISSION 03/10/2020 This is a 50 year old woman who was recently moved to this area from [place] to help look after her mother who is ill. She has a several month history of a wound on the left medial lower leg. She was in the emergency room on 9/23 and 10/12. On both occasions she had x-rays that were negative and she was given doxycycline. On one occasion she had an Haematologist wrap. T the diameter of the wound on 10/12 was 6 cm I do not think a stat large today however her o compression stockings no longer fit because of swelling. Furthermore she tells me she has had external compression pumps but they are not working either. She comes with her last note from her primary doctor in [place] from June. She has a large list of prior diagnoses including iron deficiency anemia, anxiety, asthma, hypertension, coronary artery disease with an MI in 2007, cellulitis in 2019, congestive heart failure, history of a DVT on Eliquis with the advice to stay on this "lifelong", gastric ulcer, chronic blood loss history of an  iron infusion given in 2019 with a complete GI work-up negative, osteoarthritis of the left knee, cyst history of a small bowel obstruction, upper GI bleed and prediabetes. She comes in with a long list of medications however the only medications she is taking currently are Eliquis 5 mg twice daily and amlodipine 10 mg daily. She is supposed to be taking atorvastatin, Lasix 80 mg a day, isosorbide mononitrate 30 mg a day, Klor-Con 20 mEq a day but she does not appear to be taking any of these. She tells me that she has had difficulty getting a primary doctor ABI in our clinic was 1.17 on the left 03/27/2020; patient has a wound on the left anterior lower leg in the setting of chronic lymphedema. The wound margins have been coming in nicely. 12/6; left anterior lower leg wound in the setting of chronic lymphedema. Her wound is contracting. She brought her stockings which are some form of external compression garment I have never really seen these but she said she paid $70 for them in [place] about a year ago 12/13; this is a patient with chronic venous insufficiency and lymphedema. She has a wound on her left medial lower leg that is healed today. She has severe hemosiderin deposition. She has her own 30 mm compression stocking 05/19/2020 This is a patient we discharged a little over a month ago. She has severe chronic venous insufficiency and lymphedema. He had a wound on her left medial lower leg. She had her own 30/40 mm compression stockings. She tells me that she was in the ER a week ago related to iron deficiency anemia. As she was leaving the emergency room she tripped and fell on the curb reopening the wound in the exact same place. She also says her stockings are no longer tight enough and looking at the edema in her  leg today that certainly have to agree Objective Constitutional Sitting or standing Blood Pressure is within target range for patient.. Supine Blood Pressure is within target  range for patient.. Pulse regular and within target range for patient.[name] Respirations regular, non-labored and within target range.[name] Appears in no distress. Vitals Time Taken: 10:31 AM, Height: 66 in, Weight: 327 lbs, BMI: 52.8, Temperature: 97.8 F, Pulse: 89 bpm, Respiratory Rate: 16 breaths/min, Blood Pressure: 114/68 mmHg. Cardiovascular Pedal pulses are palpable. Edema present in both extremities. Severe changes of chronic venous insufficiency with secondary lymphedema. General Notes: Wound exam; the area on the left medial tibia on the lower extremity. The wound surface does not look too bad. Vigorously debrided with wound cleanser and gauze. I did not attempt to mechanically debride this. The edema in her lower extremity is not well controlled Integumentary (Hair, Skin) Wound #2 status is Open. Original cause of wound was Trauma. The wound is located on the Left,Medial Lower Leg. The wound measures 3cm length x 3.2cm width x 0.1cm depth; 7.54cm^2 area and 0.754cm^3 volume. There is Fat Layer (Subcutaneous Tissue) exposed. There is no tunneling or undermining noted. There is a medium amount of serosanguineous drainage noted. The wound margin is distinct with the outline attached to the wound base. There is large (67- 100%) red, pink, pale granulation within the wound bed. There is no necrotic tissue within the wound bed. Assessment Active Problems ICD-10 Non-pressure chronic ulcer of other part of left lower leg with fat layer exposed Lymphedema, not elsewhere classified Chronic venous hypertension (idiopathic) with ulcer and inflammation of left lower extremity Procedures Wound #2 Pre-procedure diagnosis of Wound #2 is a Trauma, Other located on the Left,Medial Lower Leg . There was a Four Layer Compression Therapy Procedure by Deon Pilling, RN. Post procedure Diagnosis Wound #2: Same as Pre-Procedure Plan Follow-up Appointments: Return Appointment in 2 weeks. - MD visit Nurse  Visit: - 1 week Bathing/ Shower/ Hygiene: May shower with protection but do not get wound dressing(s) wet. - use cast protector to keep wrap dry in the shower Edema Control - Lymphedema / SCD / Other: Elevate legs to the level of the heart or above for 30 minutes daily and/or when sitting, a frequency of: Avoid standing for long periods of time. Patient to wear own compression stockings every day. - right leg daily Exercise regularly WOUND #2: - Lower Leg Wound Laterality: Left, Medial Peri-Wound Care: Sween Lotion (Moisturizing lotion) 1 x Per Week/30 Days Discharge Instructions: Apply moisturizing lotion as directed Prim Dressing: Hydrofera Blue Classic Foam, 2x2 in 1 x Per Week/30 Days ary Discharge Instructions: Moisten with saline prior to applying to wound bed Secondary Dressing: Woven Gauze Sponge, Non-Sterile 4x4 in 1 x Per Week/30 Days Discharge Instructions: Apply over primary dressing as directed. Secondary Dressing: ABD Pad, 5x9 1 x Per Week/30 Days Discharge Instructions: Apply over primary dressing as directed. Com pression Wrap: FourPress (4 layer compression wrap) 1 x Per Week/30 Days Discharge Instructions: Apply four layer compression as directed. 1. After vigorous debridement with Anasept and gauze I cannot really tell whether this will require mechanical debridement. I therefore used Hydrofera Blue under 4-layer compression and we will see if we are able to get any epithelialization of this area. If this stalls then she will certainly need mechanical debridement 2. No evidence of infection 3. I agree with the patient certainly her degree of edema in the left leg is insufficient to maintain skin integrity. She is going to need more aggressive  stocking compression Electronic Signature(s) Signed: 05/19/2020 4:10:40 PM By: Linton Ham MD Entered By: Linton Ham on 05/19/2020  11:12:40 -------------------------------------------------------------------------------- SuperBill Details Patient Name: Date of Service: [name] 05/19/2020 Medical Record Number: [id number] Patient Account Number: [id number] Date of Birth/Sex: Treating RN: 24-Jun-1970 (50 y.o. [name] Primary Care Provider: [name] Other Clinician: Referring Provider: Treating Provider/Extender: Nyra Market in Treatment: 10 Diagnosis Coding ICD-10 Codes Code Description [phone] Non-pressure chronic ulcer of other part of left lower leg with fat layer exposed I89.0 Lymphedema, not elsewhere classified I87.332 Chronic venous hypertension (idiopathic) with ulcer and inflammation of left lower extremity Facility Procedures CPT4 Code: [id number] Description: [id number] - WOUND CARE VISIT-LEV 3 EST PT Modifier: 25 Quantity: 1 CPT4 Code: [id number] Description: (Facility Use Only) [id number] - [place] LWR LT LEG Modifier: Quantity: 1 Physician Procedures : CPT4 Code Description Modifier [id number] - WC PHYS LEVEL 3 - EST PT ICD-10 Diagnosis Description L97.822 Non-pressure chronic ulcer of other part of left lower leg with fat layer exposed I89.0 Lymphedema, not elsewhere classified I87.332 Chronic  venous hypertension (idiopathic) with ulcer and inflammation of left lower extremity Quantity: 1 Electronic Signature(s) Signed: 05/19/2020 4:10:40 PM By: Linton Ham MD Entered By: Linton Ham on 05/19/2020 11:13:13

## 2020-05-19 NOTE — Progress Notes (Signed)
[name], [name] ([id number]) Visit Report for 05/19/2020 Arrival Information Details Patient Name: Date of Service: [name], [name] 05/19/2020 10:30 A M Medical Record Number: [id number] Patient Account Number: [id number] Date of Birth/Sex: Treating RN: [birth year] (50 y.o. [name] Primary Care [name]: [name] Other Clinician: Referring [name]: Treating [name]/Extender: [name] in Treatment: 10 Visit Information History Since Last Visit Added or deleted any medications: No Patient Arrived: Ambulatory Any new allergies or adverse reactions: No Arrival Time: 10:27 Had a fall or experienced change in Yes Accompanied By: self activities of daily living that may affect Transfer Assistance: None risk of falls: Patient Identification Verified: Yes Signs or symptoms of abuse/neglect since last visito No Secondary Verification Process Completed: Yes Hospitalized since last visit: No Patient Requires Transmission-Based Precautions: No Implantable device outside of the clinic excluding No Patient Has Alerts: Yes cellular tissue based products placed in the center Patient Alerts: Patient on Blood Thinner since last visit: Has Dressing in Place as Prescribed: Yes Pain Present Now: Yes Electronic Signature(s) Signed: 05/19/2020 11:07:47 AM By: [name] Entered By: [name] on 05/19/2020 10:30:21 -------------------------------------------------------------------------------- Clinic Level of Care Assessment Details Patient Name: Date of Service: [name], [name] 05/19/2020 10:30 A M Medical Record Number: [id number] Patient Account Number: [id number] Date of Birth/Sex: Treating RN: 1970/06/04 (50 y.o. [name]: [name] Other Clinician: Referring [name]: Treating [name]/Extender: [name] in Treatment: 10 Clinic Level of Care  Assessment Items TOOL 4 Quantity Score '[]'$  - 0 Use when only an EandM is performed on FOLLOW-UP visit ASSESSMENTS - Nursing Assessment / Reassessment X- 1 10 Reassessment of Co-morbidities (includes updates in patient status) X- 1 5 Reassessment of Adherence to Treatment Plan ASSESSMENTS - Wound and Skin A ssessment / Reassessment X - Simple Wound Assessment / Reassessment - one wound 1 5 '[]'$  - 0 Complex Wound Assessment / Reassessment - multiple wounds '[]'$  - 0 Dermatologic / Skin Assessment (not related to wound area) ASSESSMENTS - Focused Assessment X- 1 5 Circumferential Edema Measurements - multi extremities '[]'$  - 0 Nutritional Assessment / Counseling / Intervention X- 1 5 Lower Extremity Assessment (monofilament, tuning fork, pulses) '[]'$  - 0 Peripheral Arterial Disease Assessment (using hand held doppler) ASSESSMENTS - Ostomy and/or Continence Assessment and Care '[]'$  - 0 Incontinence Assessment and Management '[]'$  - 0 Ostomy Care Assessment and Management (repouching, etc.) PROCESS - Coordination of Care X - Simple Patient / Family Education for ongoing care 1 15 '[]'$  - 0 Complex (extensive) Patient / Family Education for ongoing care X- 1 10 Staff obtains Programmer, systems, Records, T Results / Process Orders est '[]'$  - 0 Staff telephones HHA, Nursing Homes / Clarify orders / etc '[]'$  - 0 Routine Transfer to another Facility (non-emergent condition) '[]'$  - 0 Routine Hospital Admission (non-emergent condition) '[]'$  - 0 New Admissions / Biomedical engineer / Ordering NPWT Apligraf, etc. , '[]'$  - 0 Emergency Hospital Admission (emergent condition) X- 1 10 Simple Discharge Coordination '[]'$  - 0 Complex (extensive) Discharge Coordination PROCESS - Special Needs '[]'$  - 0 Pediatric / Minor Patient Management '[]'$  - 0 Isolation Patient Management '[]'$  - 0 Hearing / Language / Visual special needs '[]'$  - 0 Assessment of Community assistance (transportation, D/C planning, etc.) '[]'$  -  0 Additional assistance / Altered mentation '[]'$  - 0 Support Surface(s) Assessment (bed, cushion, seat, etc.) INTERVENTIONS - Wound Cleansing / Measurement X - Simple Wound Cleansing - one wound 1 5 '[]'$  -  0 Complex Wound Cleansing - multiple wounds X- 1 5 Wound Imaging (photographs - any number of wounds) '[]'$  - 0 Wound Tracing (instead of photographs) X- 1 5 Simple Wound Measurement - one wound '[]'$  - 0 Complex Wound Measurement - multiple wounds INTERVENTIONS - Wound Dressings X - Small Wound Dressing one or multiple wounds 1 10 '[]'$  - 0 Medium Wound Dressing one or multiple wounds '[]'$  - 0 Large Wound Dressing one or multiple wounds X- 1 5 Application of Medications - topical '[]'$  - 0 Application of Medications - injection INTERVENTIONS - Miscellaneous '[]'$  - 0 External ear exam '[]'$  - 0 Specimen Collection (cultures, biopsies, blood, body fluids, etc.) '[]'$  - 0 Specimen(s) / Culture(s) sent or taken to Lab for analysis '[]'$  - 0 Patient Transfer (multiple staff / Civil Service fast streamer / Similar devices) '[]'$  - 0 Simple Staple / Suture removal (25 or less) '[]'$  - 0 Complex Staple / Suture removal (26 or more) '[]'$  - 0 Hypo / Hyperglycemic Management (close monitor of Blood Glucose) '[]'$  - 0 Ankle / Brachial Index (ABI) - do not check if billed separately X- 1 5 Vital Signs Has the patient been seen at the hospital within the last three years: Yes Total Score: 100 Level Of Care: New/Established - Level 3 Electronic Signature(s) Signed: 05/19/2020 4:31:51 PM By: Megan Gouty RN, BSN Entered By: [name] on 05/19/2020 10:56:24 -------------------------------------------------------------------------------- Compression Therapy Details Patient Name: Date of Service: [name], [name] 05/19/2020 10:30 A M Medical Record Number: [name] Patient Account Number: [id number] Date of Birth/Sex: Treating RN: 07/19/1970 (50 y.o. [name] Primary Care [name]: [name] Other  Clinician: Referring [name]: Treating [name]/Extender: [name] in Treatment: 10 Compression Therapy Performed for Wound Assessment: Wound #2 Left,Medial Lower Leg Performed By: Clinician Deon Pilling, RN Compression Type: Four Layer Post Procedure Diagnosis Same as Pre-procedure Electronic Signature(s) Signed: 05/19/2020 4:31:51 PM By: Megan Gouty RN, BSN Entered By: [name] on 05/19/2020 10:56:56 -------------------------------------------------------------------------------- Encounter Discharge Information Details Patient Name: Date of Service: Megan Session D. 05/19/2020 10:30 A M Medical Record Number: [name] Patient Account Number: [id number] Date of Birth/Sex: Treating RN: 1971-01-19 (50 y.o. [name] Primary Care Jazzmyn Filion: [name] Other Clinician: Referring Zykerria Tanton: Treating Lenward Able/Extender: [name] in Treatment: 10 Encounter Discharge Information Items Discharge Condition: Stable Ambulatory Status: Cane Discharge Destination: Home Transportation: Private Auto Accompanied By: self Schedule Follow-up Appointment: Yes Clinical Summary of Care: Electronic Signature(s) Signed: 05/19/2020 4:31:03 PM By: Deon Pilling Entered By: Deon Pilling on 05/19/2020 11:27:24 -------------------------------------------------------------------------------- Lower Extremity Assessment Details Patient Name: Date of Service: [name], [name] 05/19/2020 10:30 A M Medical Record Number: [name] Patient Account Number: [id number] Date of Birth/Sex: Treating RN: [birth year] (50 y.o. [name] Primary Care Chasta Deshpande: [name] Other Clinician: Referring Eisley Barber: Treating Naraya Stoneberg/Extender: [name] in Treatment: 10 Edema Assessment Assessed: [name]: Yes] [name]: No] Edema: [Left: Ye] [Right: s] Calf Left: Right: Point of Measurement:  30 cm From Medial Instep 51 cm Ankle Left: Right: Point of Measurement: 11 cm From Medial Instep 23.5 cm Knee To Floor Left: Right: From Medial Instep 41 cm Vascular Assessment Pulses: Dorsalis Pedis Palpable: [Left:Yes] Electronic Signature(s) Signed: 05/19/2020 4:31:03 PM By: Deon Pilling Entered By: Deon Pilling on 05/19/2020 10:45:57 -------------------------------------------------------------------------------- Multi Wound Chart Details Patient Name: Date of Service: Megan Session D. 05/19/2020 10:30 A M Medical Record Number: [name] Patient Account Number: [id number] Date of Birth/Sex: Treating RN:  07-Jan-1971 (50 y.o. [name] Primary Care Adrena Nakamura: [name] Other Clinician: Referring Kennice Finnie: Treating Merion Grimaldo/Extender: [name] in Treatment: 10 Vital Signs Height(in): 66 Pulse(bpm): 89 Weight(lbs): 327 Blood Pressure(mmHg): 114/68 Body Mass Index(BMI): 53 Temperature(F): 97.8 Respiratory Rate(breaths/min): 16 Photos: [2:No Photos Left, Medial Lower Leg] [N/A:N/A N/A] Wound Location: [2:Trauma] [N/A:N/A] Wounding Event: [2:Trauma, Other] [N/A:N/A] Primary Etiology: [2:Sleep Apnea, Congestive Heart] [N/A:N/A] Comorbid History: [2:Failure, Coronary Artery Disease, Hypertension 05/11/2020] [N/A:N/A] Date Acquired: [2:0] [N/A:N/A] Weeks of Treatment: [2:Open] [N/A:N/A] Wound Status: [2:3x3.2x0.1] [N/A:N/A] Measurements L x W x D (cm) [2:7.54] [N/A:N/A] A (cm) : rea [2:0.754] [N/A:N/A] Volume (cm) : [2:0.00%] [N/A:N/A] % Reduction in Area: [2:0.00%] [N/A:N/A] % Reduction in Volume: [2:Full Thickness Without Exposed] [N/A:N/A] Classification: [2:Support Structures Medium] [N/A:N/A] Exudate Amount: [2:Serosanguineous] [N/A:N/A] Exudate Type: [2:red, brown] [N/A:N/A] Exudate Color: [2:Distinct, outline attached] [N/A:N/A] Wound Margin: [2:Large (67-100%)] [N/A:N/A] Granulation Amount: [2:Red, Pink, Pale]  [N/A:N/A] Granulation Quality: [2:None Present (0%)] [N/A:N/A] Necrotic Amount: [2:Fat Layer (Subcutaneous Tissue): Yes N/A] Exposed Structures: [2:Fascia: No Tendon: No Muscle: No Joint: No Bone: No Small (1-33%)] [N/A:N/A] Epithelialization: [2:Compression Therapy] [N/A:N/A] Treatment Notes Electronic Signature(s) Signed: 05/19/2020 4:10:40 PM By: Linton Ham MD Signed: 05/19/2020 4:31:51 PM By: Megan Gouty RN, BSN Entered By: Linton Ham on 05/19/2020 11:04:02 -------------------------------------------------------------------------------- [place] Details Patient Name: Date of Service: Megan Session D. 05/19/2020 10:30 A M Medical Record Number: [name] Patient Account Number: [id number] Date of Birth/Sex: Treating RN: 1970/10/14 (50 y.o. [name] Primary Care Semaje Kinker: [name] Other Clinician: Referring Kitana Gage: Treating Tyresha Fede/Extender: [name] in Treatment: 10 Active Inactive Venous Leg Ulcer Nursing Diagnoses: Knowledge deficit related to disease process and management Potential for venous Insuffiency (use before diagnosis confirmed) Goals: Patient will maintain optimal edema control Date Initiated: 03/10/2020 Target Resolution Date: 06/16/2020 Goal Status: Active Interventions: Assess peripheral edema status every visit. Compression as ordered Provide education on venous insufficiency Treatment Activities: Therapeutic compression applied : 03/10/2020 Notes: Wound/Skin Impairment Nursing Diagnoses: Impaired tissue integrity Knowledge deficit related to ulceration/compromised skin integrity Goals: Patient/caregiver will verbalize understanding of skin care regimen Date Initiated: 03/10/2020 Target Resolution Date: 06/16/2020 Goal Status: Active Ulcer/skin breakdown will have a volume reduction of 30% by week 4 Date Initiated: 03/10/2020 Target Resolution Date:  06/16/2020 Goal Status: Active Interventions: Assess patient/caregiver ability to obtain necessary supplies Assess patient/caregiver ability to perform ulcer/skin care regimen upon admission and as needed Assess ulceration(s) every visit Provide education on ulcer and skin care Treatment Activities: Skin care regimen initiated : 03/10/2020 Topical wound management initiated : 03/10/2020 Notes: Electronic Signature(s) Signed: 05/19/2020 4:31:51 PM By: Megan Gouty RN, BSN Entered By: [name] on 05/19/2020 10:54:54 -------------------------------------------------------------------------------- Pain Assessment Details Patient Name: Date of Service: [name], [name] 05/19/2020 10:30 A M Medical Record Number: [name] Patient Account Number: [id number] Date of Birth/Sex: Treating RN: April 28, 1971 (50 y.o. [name] Primary Care Finlee Milo: [name] Other Clinician: Referring Elden Brucato: Treating Kemal Amores/Extender: [name] in Treatment: 10 Active Problems Location of Pain Severity and Description of Pain Patient Has Paino Yes Site Locations Rate the pain. Current Pain Level: 9 Pain Management and Medication Current Pain Management: Notes Patient had a fall on the black ice so she has returned to the clinic because the wound has reopened. Electronic Signature(s) Signed: 05/19/2020 11:07:47 AM By: [name] Signed: 05/19/2020 4:31:51 PM By: Megan Gouty RN, BSN Entered By: [name] on 05/19/2020 10:32:33 -------------------------------------------------------------------------------- Patient/Caregiver Education Details Patient Name: Date  of Service: [name], [name] 1/21/2022andnbsp10:30 A M Medical Record Number: [name] Patient Account Number: [id number] Date of Birth/Gender: Treating RN: July 21, 1970 (50 y.o. [name] Primary Care Physician: [name] Other Clinician: Referring  Physician: Treating Physician/Extender: [name] in Treatment: 10 Education Assessment Education Provided To: Patient Education Topics Provided Venous: Methods: Explain/Verbal Responses: Reinforcements needed, State content correctly Wound/Skin Impairment: Methods: Explain/Verbal Responses: Reinforcements needed, State content correctly Electronic Signature(s) Signed: 05/19/2020 4:31:51 PM By: Megan Gouty RN, BSN Entered By: [name] on 05/19/2020 10:55:52 -------------------------------------------------------------------------------- Wound Assessment Details Patient Name: Date of Service: Megan Session D. 05/19/2020 10:30 A M Medical Record Number: [name] Patient Account Number: [id number] Date of Birth/Sex: Treating RN: 1970-06-09 (50 y.o. [name] Primary Care Shatasia Cutshaw: [name] Other Clinician: Referring Luwanna Brossman: Treating Takiah Maiden/Extender: [name] in Treatment: 10 Wound Status Wound Number: 2 Primary Trauma, Other Etiology: Wound Location: Left, Medial Lower Leg Wound Open Wounding Event: Trauma Status: Date Acquired: 05/11/2020 Comorbid Sleep Apnea, Congestive Heart Failure, Coronary Artery Weeks Of Treatment: 0 History: Disease, Hypertension Clustered Wound: No Wound Measurements Length: (cm) 3 Width: (cm) 3.2 Depth: (cm) 0.1 Area: (cm) 7.54 Volume: (cm) 0.754 Wound Description Classification: Full Thickness Without Exposed Support Stru Wound Margin: Distinct, outline attached Exudate Amount: Medium Exudate Type: Serosanguineous Exudate Color: red, brown ctures Foul Odor After Cleansing: Slough/Fibrino % Reduction in Area: 0% % Reduction in Volume: 0% Epithelialization: Small (1-33%) Tunneling: No Undermining: No No No Wound Bed Granulation Amount: Large (67-100%) Exposed Structure Granulation Quality: Red, Pink, Pale Fascia Exposed: No Necrotic  Amount: None Present (0%) Fat Layer (Subcutaneous Tissue) Exposed: Yes Tendon Exposed: No Muscle Exposed: No Joint Exposed: No Bone Exposed: No Treatment Notes Wound #2 (Lower Leg) Wound Laterality: Left, Medial Cleanser Peri-Wound Care Sween Lotion (Moisturizing lotion) Discharge Instruction: Apply moisturizing lotion as directed Topical Primary Dressing Hydrofera Blue Classic Foam, 2x2 in Discharge Instruction: Moisten with saline prior to applying to wound bed Secondary Dressing Woven Gauze Sponge, Non-Sterile 4x4 in Discharge Instruction: Apply over primary dressing as directed. ABD Pad, 5x9 Discharge Instruction: Apply over primary dressing as directed. Secured With Compression Wrap FourPress (4 layer compression wrap) Discharge Instruction: Apply four layer compression as directed. Compression Stockings Add-Ons Electronic Signature(s) Signed: 05/19/2020 4:31:03 PM By: Deon Pilling Signed: 05/19/2020 4:31:51 PM By: Megan Gouty RN, BSN Entered By: Deon Pilling on 05/19/2020 10:46:21 -------------------------------------------------------------------------------- Vitals Details Patient Name: Date of Service: [name], [name] 05/19/2020 10:30 A M Medical Record Number: [name] Patient Account Number: [id number] Date of Birth/Sex: Treating RN: 1970-07-04 (50 y.o. [name] Primary Care Braydin Aloi: [name] Other Clinician: Referring Jaycee Pelzer: Treating Travares Nelles/Extender: [name] in Treatment: 10 Vital Signs Time Taken: 10:31 Temperature (F): 97.8 Height (in): 66 Pulse (bpm): 89 Weight (lbs): 327 Respiratory Rate (breaths/min): 16 Body Mass Index (BMI): 52.8 Blood Pressure (mmHg): 114/68 Reference Range: 80 - 120 mg / dl Electronic Signature(s) Signed: 05/19/2020 11:07:47 AM By: [name] Entered By: [name] on 05/19/2020 10:31:52

## 2020-05-22 ENCOUNTER — Other Ambulatory Visit (HOSPITAL_COMMUNITY)
Admission: RE | Admit: 2020-05-22 | Discharge: 2020-05-22 | Disposition: A | Discharge status: Home / Self Care | Payer: Medicaid Other | Source: Ambulatory Visit | Attending: Obstetrics & Gynecology | Admitting: Obstetrics & Gynecology

## 2020-05-22 ENCOUNTER — Encounter: Payer: Self-pay | Admitting: Obstetrics & Gynecology

## 2020-05-22 ENCOUNTER — Ambulatory Visit (INDEPENDENT_AMBULATORY_CARE_PROVIDER_SITE_OTHER): Payer: Medicaid Other | Admitting: Obstetrics & Gynecology

## 2020-05-22 ENCOUNTER — Other Ambulatory Visit: Payer: Self-pay

## 2020-05-22 ENCOUNTER — Encounter (HOSPITAL_BASED_OUTPATIENT_CLINIC_OR_DEPARTMENT_OTHER): Payer: Medicaid Other | Admitting: Internal Medicine

## 2020-05-22 VITALS — BP 120/84 | HR 88 | Wt 326.9 lb

## 2020-05-22 DIAGNOSIS — I872 Venous insufficiency (chronic) (peripheral): Secondary | ICD-10-CM | POA: Diagnosis not present

## 2020-05-22 DIAGNOSIS — Z124 Encounter for screening for malignant neoplasm of cervix: Secondary | ICD-10-CM

## 2020-05-22 DIAGNOSIS — I82562 Chronic embolism and thrombosis of left calf muscular vein: Secondary | ICD-10-CM

## 2020-05-22 DIAGNOSIS — Z01419 Encounter for gynecological examination (general) (routine) without abnormal findings: Secondary | ICD-10-CM

## 2020-05-22 DIAGNOSIS — D649 Anemia, unspecified: Secondary | ICD-10-CM | POA: Diagnosis not present

## 2020-05-22 DIAGNOSIS — R350 Frequency of micturition: Secondary | ICD-10-CM | POA: Diagnosis not present

## 2020-05-22 DIAGNOSIS — N926 Irregular menstruation, unspecified: Secondary | ICD-10-CM

## 2020-05-22 DIAGNOSIS — Z95828 Presence of other vascular implants and grafts: Secondary | ICD-10-CM

## 2020-05-22 LAB — POCT PREGNANCY, URINE: Preg Test, Ur: NEGATIVE

## 2020-05-22 NOTE — Progress Notes (Signed)
GYNECOLOGY  VISIT  CC:   Irregular bleeding  HPI: 50 y.o. G0P0000 Single Black or [place] American female here for gynecological visit and pap smear.  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  Pt reports that she also had her ovaries removed in [place] in two separate surgeries.  Pt cannot tell me exactly why she had these surgeries.  Reports she was in [place] at the time.  She has not had any vaginal bleeding since 2013.  The reason for the appointment stated vaginal bleeding but she stated several times, including with nursing staff, that she has not had vaginal bleeding in about 8 years.  Nursing intake note states she has heavy bleeding lasting for 1 1/2 days but pt states that was when she cycled but, again, she hasn't had any bleeding in about 8 years.  Pt reports she does feel like she has PMS each month and like she is going to start her cycle.  As reports she craves candy and has some breast tenderness when this occurs.  Pt reports she has current urinary urgency and some increased urine odor.  Urinary urgency has been present for a few years.  She was treated for a UTI in September.     Pt has a hx of chronic iron deficiency and anemia.  Reports she's had anemia since her birth and received iron infusions for several years.  Stopped about two years ago before moving to [place].  Is followed by [name] and is now back receiving IV iron.  She does have a porta cath that she's had for several years due to IV access issues.  Pt does have a hx of clotting with multiple clotting event.  The first one was about 20 years ago. She is on eliquis and has an IVC filter.  She is on eliquis.  Has not had a new clot since starting eliquis.    Had negative colonoscopy and EGD 12/13/2019 at [facility].    GYNECOLOGIC HISTORY: No LMP recorded. Patient is postmenopausal. Contraception: not SA Menopausal hormone  therapy: none  Patient Active Problem List   Diagnosis Date Noted  . Iron deficiency anemia due to chronic blood loss 05/10/2020  . 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 ([place]) 04/13/2020  . Venous insufficiency 04/13/2020  . Presence of IVC filter 04/13/2020  . Chronic anemia 04/13/2020  . Resistant hypertension 04/13/2020  . (HFpEF) heart failure with preserved ejection fraction ([place]) 04/13/2020  . CAD (coronary artery disease) 11/17/2019    Past Medical History:  Diagnosis Date  . CAD (coronary artery disease)    PT STATES - HAD A CARDIAC CATH - NOT TOLD SHE HAD CAD -> week note from [place] indicates history of MI (patient cannot corroborate  . Cellulitis   . CHF (congestive heart failure) ([place])   . DVT (deep venous thrombosis) (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 Generalized anxiety disorder   . H/O gastric ulcer 11/16/2018  . History of small bowel obstruction    In childhood  . Hypertension   . Iron deficiency anemia due to chronic blood loss    Previously been followed by hematology for iron infusion every 2 weeks and as of 2019; full GI evaluation including capsule endoscopy negative.  . Morbid obesity due to excess calories ([place])   . Osteoarthritis of left knee   .  Prediabetes   . Small bowel obstruction ([place])    as a child  . Speech impediment    Stutter / stammer    Past Surgical History:  Procedure Laterality Date  . BILATERAL OOPHORECTOMY  08/06/2017  . Exploratory laparoscopy  02/13/2018   Release of SBO    MEDS:   Current Outpatient Medications on File Prior to Visit  Medication Sig Dispense Refill  . acetaminophen (TYLENOL) 500 MG tablet Take 1 tablet (500 mg total) by mouth every 6 (six) hours as needed. 60 tablet 0  . albuterol (VENTOLIN HFA) 108 (90 Base) MCG/ACT inhaler Inhale 2 puffs into the lungs every 6 (six) hours as needed for wheezing or shortness of  breath. 8 g 2  . amLODipine (NORVASC) 5 MG tablet Take 0.5 tablets (2.5 mg total) by mouth daily. 15 tablet 6  . apixaban (ELIQUIS) 5 MG TABS tablet Take 1 tablet (5 mg total) by mouth 2 (two) times daily. 60 tablet 3  . atorvastatin (LIPITOR) 40 MG tablet Take 1 tablet (40 mg total) by mouth daily. 30 tablet 6  . busPIRone (BUSPAR) 15 MG tablet Take 15 mg by mouth daily.    . carvedilol (COREG) 25 MG tablet Take 1 tablet (25 mg total) by mouth 2 (two) times daily with a meal. 60 tablet 6  . cholecalciferol (VITAMIN D3) 25 MCG (1000 UNIT) tablet Take 1 tablet (1,000 Units total) by mouth daily. 30 tablet 5  . fluticasone (FLOVENT HFA) 110 MCG/ACT inhaler Inhale 2 puffs into the lungs daily.    . furosemide (LASIX) 80 MG tablet Take 1 tablet (80 mg total) by mouth daily. 30 tablet 3  . omeprazole (PRILOSEC) 20 MG capsule Take 20 mg by mouth daily.    [name] PARoxetine (PAXIL) 10 MG tablet Take 1 tablet (10 mg total) by mouth daily. 30 tablet 6  . potassium chloride SA (KLOR-CON) 20 MEQ tablet Take 1 tablet (20 mEq total) by mouth daily. 30 tablet 6  . spironolactone (ALDACTONE) 25 MG tablet Take 1 tablet (25 mg total) by mouth daily. 30 tablet 6  . valsartan (DIOVAN) 80 MG tablet Take 1 tablet (80 mg total) by mouth daily. 90 tablet 3  . vitamin B-12 (CYANOCOBALAMIN) 1000 MCG tablet Take 1,000 mcg by mouth daily.     No current facility-administered medications on file prior to visit.    ALLERGIES: Ace inhibitors, Aspirin, Hydromorphone, Vancomycin, Contrast media [iodinated diagnostic agents], and Dilaudid [hydromorphone hcl]  Family History  Problem Relation Age of [place]  . Diabetes Mellitus II Mother   . COPD Father   . Diabetes Father   . Diabetes Mellitus II Maternal Grandmother   . Breast cancer Paternal Grandfather     SH:  Single, non smoker  Review of Systems  Constitutional: Negative.   Respiratory: Negative.   Cardiovascular: Negative.   Genitourinary: Negative.    Neurological: Negative.  Negative for seizures.  Psychiatric/Behavioral: Negative.     PHYSICAL EXAMINATION:    BP 120/84   Pulse 88   Wt (!) 326 lb 14.4 oz (148.3 kg)   BMI 52.76 kg/m     General appearance: alert, cooperative and appears stated age Neck: no adenopathy, supple, symmetrical, trachea midline and thyroid normal to inspection and palpation Breasts: normal appearance, no masses or tenderness Abdomen: soft, non tender, no organomegaly, large midline scar across middle of abdomen, no umbilicus present, second scar inferiorly from midline scar in T shape, significant superficial skin varicosities noted as well Lymph:  no inguinal LAD noted  Pelvic: External genitalia:  no lesions              Urethra:  normal appearing urethra with no masses, tenderness or lesions              Bartholins and Skenes: normal                 Vagina: atrophic mucosa, no blood or discharge, no lesions              Cervix: no lesions              Bimanual Exam:  Uterus:  normal size, contour, position, consistency, mobility, non-tender              Adnexa: no mass, fullness, tenderness              Rectovaginal: Yes.  .  Confirms.              Anus:  normal sphincter tone, no lesions  Chaperone, Louisa Second, RN, was present for exam.  Assessment/Plan: 1. Well woman exam with routine gynecological exam - pap with HR HPV obtained today - MMG scheduled for tomorrow - colonoscopy 12/13/2019 at [facility] and reviewed in [place] - vaccines reviewed  - with no recent vaginal bleeding and normal (small) uterus on exam, do not feel she needs imaging or endometrial biopsy at this time.  2. Urinary frequency - Urine Culture  3. 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 ([facility])  4. Presence of IVC filter  5. Venous insufficiency  6. Chronic anemia  7. Morbid obesity ([place])

## 2020-05-22 NOTE — Progress Notes (Signed)
Ovaries removed 1997, 1998 at [facility], [place] due to ovarian cysts. Reports intermittent heavy bleeding, lasts for 1.5 days each time.   History of DVT, currenty on anticoagulants.  Apolonio Schneiders RN  05/22/20

## 2020-05-23 ENCOUNTER — Encounter (HOSPITAL_BASED_OUTPATIENT_CLINIC_OR_DEPARTMENT_OTHER): Payer: Medicaid Other | Admitting: Internal Medicine

## 2020-05-23 ENCOUNTER — Ambulatory Visit
Admission: RE | Admit: 2020-05-23 | Discharge: 2020-05-23 | Disposition: A | Discharge status: Home / Self Care | Payer: Medicaid Other | Source: Ambulatory Visit | Attending: Internal Medicine | Admitting: Internal Medicine

## 2020-05-23 DIAGNOSIS — Z1231 Encounter for screening mammogram for malignant neoplasm of breast: Secondary | ICD-10-CM | POA: Diagnosis not present

## 2020-05-23 LAB — CYTOLOGY - PAP
Comment: NEGATIVE
Diagnosis: NEGATIVE
High risk HPV: NEGATIVE

## 2020-05-23 NOTE — Progress Notes (Signed)
Informed results normal via MyChart.

## 2020-05-24 ENCOUNTER — Other Ambulatory Visit: Payer: Self-pay | Admitting: Internal Medicine

## 2020-05-24 ENCOUNTER — Other Ambulatory Visit: Payer: Self-pay

## 2020-05-24 ENCOUNTER — Encounter (HOSPITAL_BASED_OUTPATIENT_CLINIC_OR_DEPARTMENT_OTHER): Payer: Medicaid Other | Admitting: Physician Assistant

## 2020-05-24 DIAGNOSIS — Z8709 Personal history of other diseases of the respiratory system: Secondary | ICD-10-CM

## 2020-05-24 DIAGNOSIS — I87332 Chronic venous hypertension (idiopathic) with ulcer and inflammation of left lower extremity: Secondary | ICD-10-CM | POA: Diagnosis not present

## 2020-05-24 NOTE — Progress Notes (Signed)
[name], [name] ([id number]) Visit Report for 05/24/2020 SuperBill Details Patient Name: Date of Service: [name], [name] 05/24/2020 Medical Record Number: [id number] Patient Account Number: [id number] Date of Birth/Sex: Treating RN: October 28, 1970 (50 y.o. [name], [name] Primary Care Provider: [name] Other Clinician: Referring Provider: Treating Provider/Extender: Randon Goldsmith in Treatment: 10 Diagnosis Coding ICD-10 Codes Code Description [phone] Non-pressure chronic ulcer of other part of left lower leg with fat layer exposed I89.0 Lymphedema, not elsewhere classified I87.332 Chronic venous hypertension (idiopathic) with ulcer and inflammation of left lower extremity Facility Procedures CPT4 Code Description Modifier Quantity [id number] (Facility Use Only) [phone] - APPLY MULTLAY COMPRS LWR LT LEG 1 Electronic Signature(s) Signed: 05/24/2020 5:03:34 PM By: Worthy Keeler PA-C Signed: 05/24/2020 5:56:51 PM By: Rhae Hammock RN Entered By: Rhae Hammock on 05/24/2020 08:17:22

## 2020-05-24 NOTE — Telephone Encounter (Signed)
Requested Prescriptions  Pending Prescriptions Disp Refills  . PROAIR HFA 108 (90 Base) MCG/ACT inhaler [Pharmacy Med Name: PROAIR HFA 90 MCG INHALER] 8.5 g 0    Sig: INHALE TWO PUFFS BY MOUTH EVERY 6 HOURS AS NEEDED FOR WHEEZING OR FOR SHORTNESS OF BREATH     Pulmonology:  Beta Agonists Failed - 05/24/2020 10:25 AM      Failed - One inhaler should last at least one month. If the patient is requesting refills earlier, contact the patient to check for uncontrolled symptoms.      Passed - Valid encounter within last 12 months    Recent Outpatient Visits          1 month ago Establishing care with new doctor, encounter for   [place], MD      Future Appointments            In 3 weeks Ladell Pier, MD [place]   In 1 month Ellyn Hack, Leonie Green, MD [facility] [place]

## 2020-05-24 NOTE — Progress Notes (Signed)
[name], [name] ([id number]) Visit Report for 05/24/2020 Arrival Information Details Patient Name: Date of Service: [name], [name] 05/24/2020 7:30 A M Medical Record Number: [id number] Patient Account Number: [id number] Date of Birth/Sex: Treating RN: 04-05-1971 (50 y.o. [name], [name] Primary Care [place]: [name] Other Clinician: Referring Tamar Miano: Treating Deyja Sochacki/Extender: Randon Goldsmith in Treatment: 10 Visit Information History Since Last Visit Added or deleted any medications: No Patient Arrived: Ambulatory Any new allergies or adverse reactions: No Arrival Time: 08:13 Had a fall or experienced change in No Accompanied By: self activities of daily living that may affect Transfer Assistance: None risk of falls: Patient Identification Verified: Yes Signs or symptoms of abuse/neglect since last visito No Secondary Verification Process Completed: Yes Hospitalized since last visit: No Patient Requires Transmission-Based Precautions: No Implantable device outside of the clinic excluding No Patient Has Alerts: Yes cellular tissue based products placed in the center Patient Alerts: Patient on Blood Thinner since last visit: Has Dressing in Place as Prescribed: Yes Has Compression in Place as Prescribed: Yes Pain Present Now: No Electronic Signature(s) Signed: 05/24/2020 5:56:51 PM By: Rhae Hammock RN Entered By: Rhae Hammock on 05/24/2020 08:14:19 -------------------------------------------------------------------------------- Compression Therapy Details Patient Name: Date of Service: Megan Session D. 05/24/2020 7:30 A M Medical Record Number: [id number] Patient Account Number: [id number] Date of Birth/Sex: Treating RN: [birth year] (50 y.o. [name], [name] Primary Care Lari Linson: [name] Other Clinician: Referring Milton Sagona: Treating Reinhold Rickey/Extender: Randon Goldsmith in  Treatment: 10 Compression Therapy Performed for Wound Assessment: Wound #2 Left,Medial Lower Leg Performed By: Clinician Rhae Hammock, RN Compression Type: Four Layer Electronic Signature(s) Signed: 05/24/2020 5:56:51 PM By: Rhae Hammock RN Entered By: Rhae Hammock on 05/24/2020 08:16:00 -------------------------------------------------------------------------------- Encounter Discharge Information Details Patient Name: Date of Service: Megan Session D. 05/24/2020 7:30 A M Medical Record Number: [id number] Patient Account Number: [id number] Date of Birth/Sex: Treating RN: 12-Mar-1971 (50 y.o. [name], [name] Primary Care Chole Driver: [name] Other Clinician: Referring Derwood Becraft: Treating Dehaven Sine/Extender: Randon Goldsmith in Treatment: 10 Encounter Discharge Information Items Discharge Condition: Stable Ambulatory Status: Ambulatory Discharge Destination: Home Transportation: Private Auto Accompanied By: self Schedule Follow-up Appointment: Yes Clinical Summary of Care: Patient Declined Electronic Signature(s) Signed: 05/24/2020 5:56:51 PM By: Rhae Hammock RN Entered By: Rhae Hammock on 05/24/2020 08:16:47 -------------------------------------------------------------------------------- Patient/Caregiver Education Details Patient Name: Date of Service: Megan Session D. 1/26/2022andnbsp7:30 [place] Record Number: [id number] Patient Account Number: [id number] Date of Birth/Gender: Treating RN: 10/21/1970 (50 y.o. [name], [name] Primary Care Physician: [name] Other Clinician: Referring Physician: Treating Physician/Extender: Randon Goldsmith in Treatment: 10 Education Assessment Education Provided To: Patient Education Topics Provided Basic Hygiene: Methods: Explain/Verbal Responses: State content correctly Electronic Signature(s) Signed: 05/24/2020 5:56:51 PM By:  Rhae Hammock RN Entered By: Rhae Hammock on 05/24/2020 08:16:29 -------------------------------------------------------------------------------- Wound Assessment Details Patient Name: Date of Service: Megan Session D. 05/24/2020 7:30 A M Medical Record Number: [id number] Patient Account Number: [id number] Date of Birth/Sex: Treating RN: Jul 29, 1970 (50 y.o. [name], [name] Primary Care Enis Riecke: [name] Other Clinician: Referring Cote Mayabb: Treating Davari Lopes/Extender: Randon Goldsmith in Treatment: 10 Wound Status Wound Number: 2 Primary Etiology: Trauma, Other Wound Location: Left, Medial Lower Leg Wound Status: Open Wounding Event: Trauma Date Acquired: 05/11/2020 Weeks Of Treatment: 0 Clustered Wound: No Wound Measurements Length: (cm) 3 Width: (cm) 3.2 Depth: (cm) 0.1 Area: (cm) 7.54 Volume: (cm)  0.754 % Reduction in Area: 0% % Reduction in Volume: 0% Wound Description Classification: Full Thickness Without Exposed Support Structur es Treatment Notes Wound #2 (Lower Leg) Wound Laterality: Left, Medial Cleanser Peri-Wound Care Sween Lotion (Moisturizing lotion) Discharge Instruction: Apply moisturizing lotion as directed Topical Primary Dressing Hydrofera Blue Classic Foam, 2x2 in Discharge Instruction: Moisten with saline prior to applying to wound bed Secondary Dressing Woven Gauze Sponge, Non-Sterile 4x4 in Discharge Instruction: Apply over primary dressing as directed. ABD Pad, 5x9 Discharge Instruction: Apply over primary dressing as directed. Secured With Compression Wrap FourPress (4 layer compression wrap) Discharge Instruction: Apply four layer compression as directed. Compression Stockings Add-Ons Electronic Signature(s) Signed: 05/24/2020 5:56:51 PM By: Rhae Hammock RN Entered By: Rhae Hammock on 05/24/2020  08:15:09 -------------------------------------------------------------------------------- Vitals Details Patient Name: Date of Service: [name], [name] 05/24/2020 7:30 A M Medical Record Number: [id number] Patient Account Number: [id number] Date of Birth/Sex: Treating RN: 1970/07/11 (50 y.o. [name], [name] Primary Care Riyan Haile: [name] Other Clinician: Referring Jnyah Brazee: Treating Swati Granberry/Extender: Randon Goldsmith in Treatment: 10 Vital Signs Time Taken: 08:14 Temperature (F): 97.6 Height (in): 66 Pulse (bpm): 75 Weight (lbs): 327 Respiratory Rate (breaths/min): 17 Body Mass Index (BMI): 52.8 Blood Pressure (mmHg): 110/83 Reference Range: 80 - 120 mg / dl Electronic Signature(s) Signed: 05/24/2020 5:56:51 PM By: Rhae Hammock RN Entered By: Rhae Hammock on 05/24/2020 08:14:59

## 2020-05-25 ENCOUNTER — Other Ambulatory Visit: Payer: Self-pay

## 2020-05-25 ENCOUNTER — Inpatient Hospital Stay: Payer: Medicaid Other

## 2020-05-25 VITALS — BP 128/72 | HR 78 | Temp 98.2°F | Resp 18

## 2020-05-25 DIAGNOSIS — D509 Iron deficiency anemia, unspecified: Secondary | ICD-10-CM | POA: Diagnosis not present

## 2020-05-25 DIAGNOSIS — D500 Iron deficiency anemia secondary to blood loss (chronic): Secondary | ICD-10-CM

## 2020-05-25 MED ORDER — LORATADINE 10 MG PO TABS
10.0000 mg | ORAL_TABLET | Freq: Once | ORAL | Status: AC
  Administered 2020-05-25: 10 mg via ORAL

## 2020-05-25 MED ORDER — ACETAMINOPHEN 325 MG PO TABS
650.0000 mg | ORAL_TABLET | Freq: Once | ORAL | Status: AC
  Administered 2020-05-25: 650 mg via ORAL

## 2020-05-25 MED ORDER — SODIUM CHLORIDE 0.9 % IV SOLN
750.0000 mg | Freq: Once | INTRAVENOUS | Status: AC
  Administered 2020-05-25: 750 mg via INTRAVENOUS
  Filled 2020-05-25: qty 15

## 2020-05-25 MED ORDER — SODIUM CHLORIDE 0.9 % IV SOLN
Freq: Once | INTRAVENOUS | Status: AC
  Filled 2020-05-25: qty 250

## 2020-05-25 NOTE — Patient Instructions (Signed)

## 2020-05-25 NOTE — Progress Notes (Signed)
Pt discharged in no apparent distress. Pt left ambulatory without assistance. Pt aware of discharge instructions and verbalized understanding and had no further questions.  

## 2020-05-26 ENCOUNTER — Encounter (INDEPENDENT_AMBULATORY_CARE_PROVIDER_SITE_OTHER): Payer: Medicaid Other | Admitting: Vascular Surgery

## 2020-05-27 ENCOUNTER — Other Ambulatory Visit: Payer: Self-pay | Admitting: Obstetrics & Gynecology

## 2020-05-27 DIAGNOSIS — R350 Frequency of micturition: Secondary | ICD-10-CM

## 2020-05-29 ENCOUNTER — Telehealth: Payer: Self-pay

## 2020-05-29 ENCOUNTER — Encounter (HOSPITAL_BASED_OUTPATIENT_CLINIC_OR_DEPARTMENT_OTHER): Payer: Medicaid Other | Admitting: Internal Medicine

## 2020-05-29 ENCOUNTER — Other Ambulatory Visit: Payer: Self-pay

## 2020-05-29 DIAGNOSIS — I87332 Chronic venous hypertension (idiopathic) with ulcer and inflammation of left lower extremity: Secondary | ICD-10-CM | POA: Diagnosis not present

## 2020-05-29 NOTE — Telephone Encounter (Signed)
Called pt to request her to return to office to leave urine sample for culture. Pt agreeable. Would like to come 06/05/20 in the afternoon. [place] office notified.

## 2020-06-01 ENCOUNTER — Encounter (HOSPITAL_BASED_OUTPATIENT_CLINIC_OR_DEPARTMENT_OTHER): Payer: Medicaid Other | Attending: Internal Medicine | Admitting: Internal Medicine

## 2020-06-01 ENCOUNTER — Other Ambulatory Visit: Payer: Self-pay

## 2020-06-01 DIAGNOSIS — I110 Hypertensive heart disease with heart failure: Secondary | ICD-10-CM | POA: Insufficient documentation

## 2020-06-01 DIAGNOSIS — I509 Heart failure, unspecified: Secondary | ICD-10-CM | POA: Insufficient documentation

## 2020-06-01 DIAGNOSIS — I87332 Chronic venous hypertension (idiopathic) with ulcer and inflammation of left lower extremity: Secondary | ICD-10-CM | POA: Insufficient documentation

## 2020-06-01 DIAGNOSIS — Z86718 Personal history of other venous thrombosis and embolism: Secondary | ICD-10-CM | POA: Diagnosis not present

## 2020-06-01 DIAGNOSIS — I2510 Atherosclerotic heart disease of native coronary artery without angina pectoris: Secondary | ICD-10-CM | POA: Insufficient documentation

## 2020-06-01 DIAGNOSIS — I890 Lymphedema, not elsewhere classified: Secondary | ICD-10-CM | POA: Insufficient documentation

## 2020-06-01 DIAGNOSIS — I252 Old myocardial infarction: Secondary | ICD-10-CM | POA: Insufficient documentation

## 2020-06-01 DIAGNOSIS — Z7901 Long term (current) use of anticoagulants: Secondary | ICD-10-CM | POA: Insufficient documentation

## 2020-06-01 DIAGNOSIS — L97822 Non-pressure chronic ulcer of other part of left lower leg with fat layer exposed: Secondary | ICD-10-CM | POA: Insufficient documentation

## 2020-06-01 NOTE — Progress Notes (Signed)
Megan, [name] ([id number]) Visit Report for 06/01/2020 HPI Details Patient Name: Date of Service: [name], [name] 06/01/2020 2:00 PM Medical Record Number: [id number] Patient Account Number: [id number] Date of Birth/Sex: Treating RN: 1971-02-11 (50 y.o. [name] Primary Care Provider: [name] Other Clinician: Referring Provider: Treating Provider/Extender: Nyra Market in Treatment: 11 History of Present Illness HPI Description: ADMISSION 03/10/2020 This is a 50 year old woman who was recently moved to this area from [place] to help look after her mother who is ill. She has a several month history of a wound on the left medial lower leg. She was in the emergency room on 9/23 and 10/12. On both occasions she had x-rays that were negative and she was given doxycycline. On one occasion she had an Haematologist wrap. T the diameter of the wound on 10/12 was 6 cm I do not think a stat large today however her o compression stockings no longer fit because of swelling. Furthermore she tells me she has had external compression pumps but they are not working either. She comes with her last note from her primary doctor in [place] from June. She has a large list of prior diagnoses including iron deficiency anemia, anxiety, asthma, hypertension, coronary artery disease with an MI in 2007, cellulitis in 2019, congestive heart failure, history of a DVT on Eliquis with the advice to stay on this "lifelong", gastric ulcer, chronic blood loss history of an iron infusion given in 2019 with a complete GI work-up negative, osteoarthritis of the left knee, cyst history of a small bowel obstruction, upper GI bleed and prediabetes. She comes in with a long list of medications however the only medications she is taking currently are Eliquis 5 mg twice daily and amlodipine 10 mg daily. She is supposed to be taking atorvastatin, Lasix 80 mg a day, isosorbide mononitrate 30  mg a day, Klor-Con 20 mEq a day but she does not appear to be taking any of these. She tells me that she has had difficulty getting a primary doctor ABI in our clinic was 1.17 on the left 03/27/2020; patient has a wound on the left anterior lower leg in the setting of chronic lymphedema. The wound margins have been coming in nicely. 12/6; left anterior lower leg wound in the setting of chronic lymphedema. Her wound is contracting. She brought her stockings which are some form of external compression garment I have never really seen these but she said she paid $70 for them in [place] about a year ago 12/13; this is a patient with chronic venous insufficiency and lymphedema. She has a wound on her left medial lower leg that is healed today. She has severe hemosiderin deposition. She has her own 30 mm compression stocking 05/19/2020 This is a patient we discharged a little over a month ago. She has severe chronic venous insufficiency and lymphedema. He had a wound on her left medial lower leg. She had her own 30/40 mm compression stockings. She tells me that she was in the ER a week ago related to iron deficiency anemia. As she was leaving the emergency room she tripped and fell on the curb reopening the wound in the exact same place. She also says her stockings are no longer tight enough and looking at the edema in her leg today that certainly have to agree 2/3; left medial lower leg. The wound is smaller. We have her in 4 layer compression. Venous reflux studies are next week I believe  on 10 February. We will see her back after this for a nurse visit. Electronic Signature(s) Signed: 06/01/2020 4:57:15 PM By: Linton Ham MD Entered By: Linton Ham on 06/01/2020 14:20:03 -------------------------------------------------------------------------------- Physical Exam Details Patient Name: Date of Service: [name], [name] 06/01/2020 2:00 PM Medical Record Number: [id number] Patient Account  Number: [id number] Date of Birth/Sex: Treating RN: 1970-10-19 (50 y.o. [name] Primary Care Provider: [name] Other Clinician: Referring Provider: Treating Provider/Extender: Nyra Market in Treatment: 11 Cardiovascular Pedal pulses are palpable. Edema control is marginal. Notes Wound exam; the areas on the left medial tibia on the lower extremity. Surface does not look bad she does not require mechanical debridement however I continuously debride this with Anasept and gauze. The surface of the wound under illumination looks healthy. She has surrounding severe stasis dermatitis/inflammation. Electronic Signature(s) Signed: 06/01/2020 4:57:15 PM By: Linton Ham MD Entered By: Linton Ham on 06/01/2020 14:21:51 -------------------------------------------------------------------------------- Physician Orders Details Patient Name: Date of Service: [name], [name] 06/01/2020 2:00 PM Medical Record Number: [id number] Patient Account Number: [id number] Date of Birth/Sex: Treating RN: 09/16/1970 (50 y.o. [name] Primary Care Provider: [name] Other Clinician: Referring Provider: Treating Provider/Extender: Nyra Market in Treatment: 11 Verbal / Phone Orders: No Diagnosis Coding ICD-10 Coding Code Description [phone] Non-pressure chronic ulcer of other part of left lower leg with fat layer exposed I89.0 Lymphedema, not elsewhere classified I87.332 Chronic venous hypertension (idiopathic) with ulcer and inflammation of left lower extremity Follow-up Appointments ppointment in 2 weeks. - MD visit Return A Nurse Visit: - 1 week Friday afternoon after venous study that is scheduled for 1015. Bathing/ Shower/ Hygiene May shower with protection but do not get wound dressing(s) wet. - use cast protector to keep wrap dry in the shower Edema Control - Lymphedema / SCD / Other Bilateral Lower  Extremities Elevate legs to the level of the heart or above for 30 minutes daily and/or when sitting, a frequency of: Avoid standing for long periods of time. Patient to wear own compression stockings every day. - right leg daily Exercise regularly Wound Treatment Wound #2 - Lower Leg Wound Laterality: Left, Medial Peri-Wound Care: Sween Lotion (Moisturizing lotion) 1 x Per Week/30 Days Discharge Instructions: Apply moisturizing lotion as directed Prim Dressing: Hydrofera Blue Classic Foam, 2x2 in 1 x Per Week/30 Days ary Discharge Instructions: Moisten with saline prior to applying to wound bed Secondary Dressing: Woven Gauze Sponge, Non-Sterile 4x4 in 1 x Per Week/30 Days Discharge Instructions: Apply over primary dressing as directed. Secondary Dressing: ABD Pad, 5x9 1 x Per Week/30 Days Discharge Instructions: Apply over primary dressing as directed. Compression Wrap: FourPress (4 layer compression wrap) 1 x Per Week/30 Days Discharge Instructions: Apply four layer compression as directed. Compression Wrap: Unna boot first layer 1 x Per Week/30 Days Discharge Instructions: Apply unna boot first layer at upper portion of lower leg. Electronic Signature(s) Signed: 06/01/2020 4:57:15 PM By: Linton Ham MD Signed: 06/01/2020 5:30:16 PM By: Deon Pilling Entered By: Deon Pilling on 06/01/2020 14:13:57 -------------------------------------------------------------------------------- Problem List Details Patient Name: Date of Service: [name], Elecia D. 06/01/2020 2:00 PM Medical Record Number: [id number] Patient Account Number: [id number] Date of Birth/Sex: Treating RN: [birth year] (50 y.o. [name] Primary Care Provider: [name] Other Clinician: Referring Provider: Treating Provider/Extender: Nyra Market in Treatment: 11 Active Problems ICD-10 Encounter Code Description Active Date MDM Diagnosis [phone] Non-pressure chronic ulcer of  other part of  left lower leg with fat layer exposed11/03/2020 No Yes I89.0 Lymphedema, not elsewhere classified 03/10/2020 No Yes I87.332 Chronic venous hypertension (idiopathic) with ulcer and inflammation of left 03/10/2020 No Yes lower extremity Inactive Problems Resolved Problems Electronic Signature(s) Signed: 06/01/2020 4:57:15 PM By: Linton Ham MD Entered By: Linton Ham on 06/01/2020 14:19:09 -------------------------------------------------------------------------------- Progress Note Details Patient Name: Date of Service: [name], Anye D. 06/01/2020 2:00 PM Medical Record Number: [id number] Patient Account Number: [id number] Date of Birth/Sex: Treating RN: [birth year] (50 y.o. [name] Primary Care Provider: [name] Other Clinician: Referring Provider: Treating Provider/Extender: Nyra Market in Treatment: 11 Subjective History of Present Illness (HPI) ADMISSION 03/10/2020 This is a 50 year old woman who was recently moved to this area from [place] to help look after her mother who is ill. She has a several month history of a wound on the left medial lower leg. She was in the emergency room on 9/23 and 10/12. On both occasions she had x-rays that were negative and she was given doxycycline. On one occasion she had an Haematologist wrap. T the diameter of the wound on 10/12 was 6 cm I do not think a stat large today however her o compression stockings no longer fit because of swelling. Furthermore she tells me she has had external compression pumps but they are not working either. She comes with her last note from her primary doctor in [place] from June. She has a large list of prior diagnoses including iron deficiency anemia, anxiety, asthma, hypertension, coronary artery disease with an MI in 2007, cellulitis in 2019, congestive heart failure, history of a DVT on Eliquis with the advice to stay on this "lifelong", gastric  ulcer, chronic blood loss history of an iron infusion given in 2019 with a complete GI work-up negative, osteoarthritis of the left knee, cyst history of a small bowel obstruction, upper GI bleed and prediabetes. She comes in with a long list of medications however the only medications she is taking currently are Eliquis 5 mg twice daily and amlodipine 10 mg daily. She is supposed to be taking atorvastatin, Lasix 80 mg a day, isosorbide mononitrate 30 mg a day, Klor-Con 20 mEq a day but she does not appear to be taking any of these. She tells me that she has had difficulty getting a primary doctor ABI in our clinic was 1.17 on the left 03/27/2020; patient has a wound on the left anterior lower leg in the setting of chronic lymphedema. The wound margins have been coming in nicely. 12/6; left anterior lower leg wound in the setting of chronic lymphedema. Her wound is contracting. She brought her stockings which are some form of external compression garment I have never really seen these but she said she paid $70 for them in [place] about a year ago 12/13; this is a patient with chronic venous insufficiency and lymphedema. She has a wound on her left medial lower leg that is healed today. She has severe hemosiderin deposition. She has her own 30 mm compression stocking 05/19/2020 This is a patient we discharged a little over a month ago. She has severe chronic venous insufficiency and lymphedema. He had a wound on her left medial lower leg. She had her own 30/40 mm compression stockings. She tells me that she was in the ER a week ago related to iron deficiency anemia. As she was leaving the emergency room she tripped and fell on the curb reopening the wound in the exact same place.  She also says her stockings are no longer tight enough and looking at the edema in her leg today that certainly have to agree 2/3; left medial lower leg. The wound is smaller. We have her in 4 layer compression. Venous  reflux studies are next week I believe on 10 February. We will see her back after this for a nurse visit. Objective Constitutional Vitals Time Taken: 1:50 PM, Height: 66 in, Weight: 327 lbs, BMI: 52.8, Temperature: 98.3 F, Pulse: 76 bpm, Respiratory Rate: 17 breaths/min, Blood Pressure: 119/82 mmHg. Cardiovascular Pedal pulses are palpable. Edema control is marginal. General Notes: Wound exam; the areas on the left medial tibia on the lower extremity. Surface does not look bad she does not require mechanical debridement however I continuously debride this with Anasept and gauze. The surface of the wound under illumination looks healthy. She has surrounding severe stasis dermatitis/inflammation. Integumentary (Hair, Skin) Wound #2 status is Open. Original cause of wound was Trauma. The wound is located on the Left,Medial Lower Leg. The wound measures 2.8cm length x 2cm width x 0.1cm depth; 4.398cm^2 area and 0.44cm^3 volume. There is Fat Layer (Subcutaneous Tissue) exposed. There is no tunneling or undermining noted. There is a medium amount of serosanguineous drainage noted. The wound margin is distinct with the outline attached to the wound base. There is large (67- 100%) red, pink granulation within the wound bed. There is no necrotic tissue within the wound bed. Assessment Active Problems ICD-10 Non-pressure chronic ulcer of other part of left lower leg with fat layer exposed Lymphedema, not elsewhere classified Chronic venous hypertension (idiopathic) with ulcer and inflammation of left lower extremity Procedures Wound #2 Pre-procedure diagnosis of Wound #2 is a Trauma, Other located on the Left,Medial Lower Leg . There was a Four Layer Compression Therapy Procedure by Baruch Gouty, RN. Post procedure Diagnosis Wound #2: Same as Pre-Procedure Plan Follow-up Appointments: Return Appointment in 2 weeks. - MD visit Nurse Visit: - 1 week Friday afternoon after venous study that is  scheduled for 1015. Bathing/ Shower/ Hygiene: May shower with protection but do not get wound dressing(s) wet. - use cast protector to keep wrap dry in the shower Edema Control - Lymphedema / SCD / Other: Elevate legs to the level of the heart or above for 30 minutes daily and/or when sitting, a frequency of: Avoid standing for long periods of time. Patient to wear own compression stockings every day. - right leg daily Exercise regularly WOUND #2: - Lower Leg Wound Laterality: Left, Medial Peri-Wound Care: Sween Lotion (Moisturizing lotion) 1 x Per Week/30 Days Discharge Instructions: Apply moisturizing lotion as directed Prim Dressing: Hydrofera Blue Classic Foam, 2x2 in 1 x Per Week/30 Days ary Discharge Instructions: Moisten with saline prior to applying to wound bed Secondary Dressing: Woven Gauze Sponge, Non-Sterile 4x4 in 1 x Per Week/30 Days Discharge Instructions: Apply over primary dressing as directed. Secondary Dressing: ABD Pad, 5x9 1 x Per Week/30 Days Discharge Instructions: Apply over primary dressing as directed. Com pression Wrap: FourPress (4 layer compression wrap) 1 x Per Week/30 Days Discharge Instructions: Apply four layer compression as directed. Com pression Wrap: Unna boot first layer 1 x Per Week/30 Days Discharge Instructions: Apply unna boot first layer at upper portion of lower leg. 1. Continue with Hydrofera Blue/ABDs under 4-layer compression 2. We spent some time going over the option with regards to stockings. I think she is basically looking at a juxta light external compression stocking. 3. Venous reflux studies are next week we  will bring her back to replace her dressings afterwards. Electronic Signature(s) Signed: 06/01/2020 4:57:15 PM By: Linton Ham MD Entered By: Linton Ham on 06/01/2020 14:23:40 -------------------------------------------------------------------------------- SuperBill Details Patient Name: Date of Service: [name] 06/01/2020 Medical Record Number: [id number] Patient Account Number: [id number] Date of Birth/Sex: Treating RN: 26-Oct-1970 (50 y.o. [name], [name] Primary Care Provider: [name] Other Clinician: Referring Provider: Treating Provider/Extender: Nyra Market in Treatment: 11 Diagnosis Coding ICD-10 Codes Code Description [phone] Non-pressure chronic ulcer of other part of left lower leg with fat layer exposed I89.0 Lymphedema, not elsewhere classified I87.332 Chronic venous hypertension (idiopathic) with ulcer and inflammation of left lower extremity Facility Procedures CPT4 Code: [id number] Description: (Facility Use Only) [phone] - [place] [facility] LEG Modifier: Quantity: 1 Physician Procedures : CPT4 Code Description Modifier [id number] - WC PHYS LEVEL 3 - EST PT ICD-10 Diagnosis Description L97.822 Non-pressure chronic ulcer of other part of left lower leg with fat layer exposed I89.0 Lymphedema, not elsewhere classified I87.332 Chronic  venous hypertension (idiopathic) with ulcer and inflammation of left lower extremity Quantity: 1 Electronic Signature(s) Signed: 06/01/2020 4:57:15 PM By: Linton Ham MD Entered By: Linton Ham on 06/01/2020 14:24:10

## 2020-06-05 ENCOUNTER — Ambulatory Visit: Payer: Medicaid Other

## 2020-06-05 NOTE — Progress Notes (Signed)
MACAYLE, CACKOWSKI ([id number]) Visit Report for 06/01/2020 Arrival Information Details Patient Name: Date of Service: ZAKYIA, BAYAT D. 06/01/2020 2:00 PM Medical Record Number: [id number] Patient Account Number: [id number] Date of Birth/Sex: Treating RN: 03/16/1971 (50 y.o. [name], Tammi Klippel Primary Care Lanitra Battaglini: [name] Other Clinician: Referring Sedra Morfin: Treating Kali Ambler/Extender: Nyra Market in Treatment: 11 Visit Information History Since Last Visit Added or deleted any medications: No Patient Arrived: Ambulatory Any new allergies or adverse reactions: No Arrival Time: 13:49 Had a fall or experienced change in No Accompanied By: self activities of daily living that may affect Transfer Assistance: None risk of falls: Patient Identification Verified: Yes Signs or symptoms of abuse/neglect since last visito No Secondary Verification Process Completed: Yes Hospitalized since last visit: No Patient Requires Transmission-Based Precautions: No Implantable device outside of the clinic excluding No Patient Has Alerts: Yes cellular tissue based products placed in the center Patient Alerts: Patient on Blood Thinner since last visit: Has Dressing in Place as Prescribed: Yes Pain Present Now: No Electronic Signature(s) Signed: 06/05/2020 9:06:40 AM By: Sandre Kitty Entered By: Sandre Kitty on 06/01/2020 13:50:51 -------------------------------------------------------------------------------- Compression Therapy Details Patient Name: Date of Service: [name], Laquonda D. 06/01/2020 2:00 PM Medical Record Number: [id number] Patient Account Number: [id number] Date of Birth/Sex: Treating RN: [birth year] (50 y.o. [name] Primary Care Teja Costen: [name] Other Clinician: Referring Lyfe Monger: Treating Brahim Dolman/Extender: Nyra Market in Treatment: 11 Compression Therapy Performed for Wound Assessment: Wound  #2 Left,Medial Lower Leg Performed By: Clinician Baruch Gouty, RN Compression Type: Four Layer Post Procedure Diagnosis Same as Pre-procedure Electronic Signature(s) Signed: 06/01/2020 5:30:16 PM By: Deon Pilling Entered By: Deon Pilling on 06/01/2020 14:11:29 -------------------------------------------------------------------------------- Encounter Discharge Information Details Patient Name: Date of Service: [name], Diyana D. 06/01/2020 2:00 PM Medical Record Number: [id number] Patient Account Number: [id number] Date of Birth/Sex: Treating RN: 01/30/1971 (50 y.o. [name] Primary Care Maddalynn Barnard: [name] Other Clinician: Referring Nara Paternoster: Treating Loraine Bhullar/Extender: Nyra Market in Treatment: 11 Encounter Discharge Information Items Discharge Condition: Stable Ambulatory Status: Ambulatory Discharge Destination: Home Transportation: Private Auto Accompanied By: self Schedule Follow-up Appointment: Yes Clinical Summary of Care: Patient Declined Electronic Signature(s) Signed: 06/01/2020 5:53:40 PM By: Baruch Gouty RN, BSN Entered By: Baruch Gouty on 06/01/2020 14:39:12 -------------------------------------------------------------------------------- Lower Extremity Assessment Details Patient Name: Date of Service: [name] 06/01/2020 2:00 PM Medical Record Number: [id number] Patient Account Number: [id number] Date of Birth/Sex: Treating RN: Jun 26, 1970 (50 y.o. [name] Primary Care Mckynzie Liwanag: [name] Other Clinician: Referring Nisreen Guise: Treating Zetha Kuhar/Extender: Nyra Market in Treatment: 11 Edema Assessment Assessed: [name]: Yes] [name]: No] Edema: [Left: Ye] [Right: s] Calf Left: Right: Point of Measurement: 30 cm From Medial Instep 54 cm Ankle Left: Right: Point of Measurement: 11 cm From Medial Instep 23 cm Knee To Floor Left: Right: From Medial Instep  41 cm Vascular Assessment Pulses: Dorsalis Pedis Palpable: [Left:Yes] Electronic Signature(s) Signed: 06/01/2020 5:30:16 PM By: Deon Pilling Entered By: Deon Pilling on 06/01/2020 14:05:16 -------------------------------------------------------------------------------- Multi Wound Chart Details Patient Name: Date of Service: [name], Cyera D. 06/01/2020 2:00 PM Medical Record Number: [id number] Patient Account Number: [id number] Date of Birth/Sex: Treating RN: 1971-04-21 (50 y.o. [name] Primary Care Sky Primo: [name] Other Clinician: Referring Margarite Vessel: Treating Kerstin Crusoe/Extender: Nyra Market in Treatment: 11 Vital Signs Height(in): 66 Pulse(bpm): 76 Weight(lbs): 327 Blood Pressure(mmHg): 119/82 Body Mass Index(BMI): 53 Temperature(F): 98.3 Respiratory Rate(breaths/min):  49 Photos: [2:No Photos Left, Medial Lower Leg] [N/A:N/A N/A] Wound Location: [2:Trauma] [N/A:N/A] Wounding Event: [2:Trauma, Other] [N/A:N/A] Primary Etiology: [2:Sleep Apnea, Congestive Heart] [N/A:N/A] Comorbid History: [2:Failure, Coronary Artery Disease, Hypertension 05/11/2020] [N/A:N/A] Date Acquired: [2:1] [N/A:N/A] Weeks of Treatment: [2:Open] [N/A:N/A] Wound Status: [2:2.8x2x0.1] [N/A:N/A] Measurements L x W x D (cm) [id number] [N/A:N/A] A (cm) : rea [2:0.44] [N/A:N/A] Volume (cm) : [2:41.70%] [N/A:N/A] % Reduction in Area: [2:41.60%] [N/A:N/A] % Reduction in Volume: [2:Full Thickness Without Exposed] [N/A:N/A] Classification: [2:Support Structures Medium] [N/A:N/A] Exudate Amount: [2:Serosanguineous] [N/A:N/A] Exudate Type: [2:red, brown] [N/A:N/A] Exudate Color: [2:Distinct, outline attached] [N/A:N/A] Wound Margin: [2:Large (67-100%)] [N/A:N/A] Granulation Amount: [2:Red, Pink] [N/A:N/A] Granulation Quality: [2:None Present (0%)] [N/A:N/A] Necrotic Amount: [2:Fat Layer (Subcutaneous Tissue): Yes N/A] Exposed Structures: [2:Fascia: No  Tendon: No Muscle: No Joint: No Bone: No Medium (34-66%)] [N/A:N/A] Epithelialization: [2:Compression Therapy] [N/A:N/A] Treatment Notes Electronic Signature(s) Signed: 06/01/2020 4:57:15 PM By: Linton Ham MD Signed: 06/01/2020 5:30:16 PM By: Deon Pilling Entered By: Linton Ham on 06/01/2020 14:19:17 -------------------------------------------------------------------------------- Multi-Disciplinary Care Plan Details Patient Name: Date of Service: [name], Abbe D. 06/01/2020 2:00 PM Medical Record Number: [id number] Patient Account Number: [id number] Date of Birth/Sex: Treating RN: Sep 02, 1970 (50 y.o. [name] Primary Care Zayne Draheim: [name] Other Clinician: Referring Gizella [place]: Treating Betzabe Bevans/Extender: Nyra Market in Treatment: 11 Active Inactive Venous Leg Ulcer Nursing Diagnoses: Knowledge deficit related to disease process and management Potential for venous Insuffiency (use before diagnosis confirmed) Goals: Patient will maintain optimal edema control Date Initiated: 03/10/2020 Target Resolution Date: 06/30/2020 Goal Status: Active Interventions: Assess peripheral edema status every visit. Compression as ordered Provide education on venous insufficiency Treatment Activities: Therapeutic compression applied : 03/10/2020 Notes: Wound/Skin Impairment Nursing Diagnoses: Impaired tissue integrity Knowledge deficit related to ulceration/compromised skin integrity Goals: Patient/caregiver will verbalize understanding of skin care regimen Date Initiated: 03/10/2020 Target Resolution Date: 06/30/2020 Goal Status: Active Ulcer/skin breakdown will have a volume reduction of 30% by week 4 Date Initiated: 03/10/2020 Target Resolution Date: 06/16/2020 Goal Status: Active Interventions: Assess patient/caregiver ability to obtain necessary supplies Assess patient/caregiver ability to perform ulcer/skin care regimen upon  admission and as needed Assess ulceration(s) every visit Provide education on ulcer and skin care Treatment Activities: Skin care regimen initiated : 03/10/2020 Topical wound management initiated : 03/10/2020 Notes: Electronic Signature(s) Signed: 06/01/2020 5:30:16 PM By: Deon Pilling Entered By: Deon Pilling on 06/01/2020 14:14:30 -------------------------------------------------------------------------------- Pain Assessment Details Patient Name: Date of Service: HAZELEIGH, TRAMMELL D. 06/01/2020 2:00 PM Medical Record Number: [id number] Patient Account Number: [id number] Date of Birth/Sex: Treating RN: 1970-12-09 (50 y.o. [name] Primary Care Addeline Calarco: [name] Other Clinician: Referring Helayne Metsker: Treating Lakesha Levinson/Extender: Nyra Market in Treatment: 11 Active Problems Location of Pain Severity and Description of Pain Patient Has Paino No Site Locations Pain Management and Medication Current Pain Management: Electronic Signature(s) Signed: 06/01/2020 5:30:16 PM By: Deon Pilling Signed: 06/05/2020 9:06:40 AM By: Sandre Kitty Entered By: Sandre Kitty on 06/01/2020 13:51:12 -------------------------------------------------------------------------------- Patient/Caregiver Education Details Patient Name: Date of Service: [name] 2/3/2022andnbsp2:00 PM Medical Record Number: [id number] Patient Account Number: [id number] Date of Birth/Gender: Treating RN: 1971-04-09 (50 y.o. [name] Primary Care Physician: [name] Other Clinician: Referring Physician: Treating Physician/Extender: Nyra Market in Treatment: 11 Education Assessment Education Provided To: Patient Education Topics Provided Venous: Handouts: Controlling Swelling with Compression Stockings , Controlling Swelling with Multilayered Compression Wraps Methods: Explain/Verbal Responses: Reinforcements  needed Electronic Signature(s) Signed: 06/01/2020 5:30:16  PM By: Deon Pilling Entered By: Deon Pilling on 06/01/2020 14:15:13 -------------------------------------------------------------------------------- Wound Assessment Details Patient Name: Date of Service: FANISHA, PERSLEY D. 06/01/2020 2:00 PM Medical Record Number: [id number] Patient Account Number: [id number] Date of Birth/Sex: Treating RN: February 03, 1971 (49 y.o. [name], [name] Primary Care Tanessa Tidd: [name] Other Clinician: Referring Lutricia Widjaja: Treating Yeslin Delio/Extender: Nyra Market in Treatment: 11 Wound Status Wound Number: 2 Primary Trauma, Other Etiology: Wound Location: Left, Medial Lower Leg Wound Open Wounding Event: Trauma Status: Date Acquired: 05/11/2020 Comorbid Sleep Apnea, Congestive Heart Failure, Coronary Artery Weeks Of Treatment: 1 History: Disease, Hypertension Clustered Wound: No Photos Photo Uploaded By: Mikeal Hawthorne on 06/02/2020 15:28:49 Wound Measurements Length: (cm) 2.8 Width: (cm) 2 Depth: (cm) 0.1 Area: (cm) 4.398 Volume: (cm) 0.44 % Reduction in Area: 41.7% % Reduction in Volume: 41.6% Epithelialization: Medium (34-66%) Tunneling: No Undermining: No Wound Description Classification: Full Thickness Without Exposed Support Structures Wound Margin: Distinct, outline attached Exudate Amount: Medium Exudate Type: Serosanguineous Exudate Color: red, brown Foul Odor After Cleansing: No Slough/Fibrino No Wound Bed Granulation Amount: Large (67-100%) Exposed Structure Granulation Quality: Red, Pink Fascia Exposed: No Necrotic Amount: None Present (0%) Fat Layer (Subcutaneous Tissue) Exposed: Yes Tendon Exposed: No Muscle Exposed: No Joint Exposed: No Bone Exposed: No Treatment Notes Wound #2 (Lower Leg) Wound Laterality: Left, Medial Cleanser Peri-Wound Care Sween Lotion (Moisturizing lotion) Discharge Instruction: Apply moisturizing  lotion as directed Topical Primary Dressing Hydrofera Blue Classic Foam, 2x2 in Discharge Instruction: Moisten with saline prior to applying to wound bed Secondary Dressing Woven Gauze Sponge, Non-Sterile 4x4 in Discharge Instruction: Apply over primary dressing as directed. ABD Pad, 5x9 Discharge Instruction: Apply over primary dressing as directed. Secured With Compression Wrap FourPress (4 layer compression wrap) Discharge Instruction: Apply four layer compression as directed. Unna boot first layer Discharge Instruction: Apply unna boot first layer at upper portion of lower leg. Compression Stockings Add-Ons Electronic Signature(s) Signed: 06/01/2020 5:30:16 PM By: Deon Pilling Entered By: Deon Pilling on 06/01/2020 14:04:30 -------------------------------------------------------------------------------- Vitals Details Patient Name: Date of Service: [name], Lashon D. 06/01/2020 2:00 PM Medical Record Number: [id number] Patient Account Number: [id number] Date of Birth/Sex: Treating RN: [birth year] (50 y.o. [name], [name] Primary Care Xander Jutras: [name] Other Clinician: Referring Lakena Sparlin: Treating Anijah Spohr/Extender: Nyra Market in Treatment: 11 Vital Signs Time Taken: 13:50 Temperature (F): 98.3 Height (in): 66 Pulse (bpm): 76 Weight (lbs): 327 Respiratory Rate (breaths/min): 17 Body Mass Index (BMI): 52.8 Blood Pressure (mmHg): 119/82 Reference Range: 80 - 120 mg / dl Electronic Signature(s) Signed: 06/05/2020 9:06:40 AM By: Sandre Kitty Entered By: Sandre Kitty on 06/01/2020 13:51:07

## 2020-06-06 NOTE — Progress Notes (Signed)
SONJI, KROENKE ([id number]) Visit Report for 05/29/2020 SuperBill Details Patient Name: Date of Service: [name], [name] 05/29/2020 Medical Record Number: [id number] Patient Account Number: [id number] Date of Birth/Sex: Treating RN: [birth year] (50 y.o. [name], [name] Primary Care Provider: [name] Other Clinician: Referring Provider: Treating Provider/Extender: Nyra Market in Treatment: 11 Diagnosis Coding ICD-10 Codes Code Description [phone] Non-pressure chronic ulcer of other part of left lower leg with fat layer exposed I89.0 Lymphedema, not elsewhere classified I87.332 Chronic venous hypertension (idiopathic) with ulcer and inflammation of left lower extremity Facility Procedures CPT4 Code Description Modifier Quantity [id number] (Facility Use Only) [phone] - [place] 1 Electronic Signature(s) Signed: 05/29/2020 4:50:22 PM By: Linton Ham MD Signed: 06/06/2020 1:48:37 PM By: Rhae Hammock RN Entered By: Rhae Hammock on 05/29/2020 15:14:07

## 2020-06-06 NOTE — Progress Notes (Signed)
NYELA, SLUYTER ([id number]) Visit Report for 05/29/2020 Arrival Information Details Patient Name: Date of Service: MARGAN, FRIESENHAHN 05/29/2020 12:30 PM Medical Record Number: [id number] Patient Account Number: [id number] Date of Birth/Sex: Treating RN: [birth year] (50 y.o. [name] Primary Care Lera Gaines: [name] Other Clinician: Referring Loel Betancur: Treating Envy Meno/Extender: Nyra Market in Treatment: 11 Visit Information History Since Last Visit Added or deleted any medications: No Patient Arrived: Ambulatory Any new allergies or adverse reactions: No Arrival Time: 12:40 Had a fall or experienced change in No Accompanied By: self activities of daily living that may affect Transfer Assistance: None risk of falls: Patient Identification Verified: Yes Signs or symptoms of abuse/neglect since last visito No Secondary Verification Process Completed: Yes Hospitalized since last visit: No Patient Requires Transmission-Based Precautions: No Implantable device outside of the clinic excluding No Patient Has Alerts: Yes cellular tissue based products placed in the center Patient Alerts: Patient on Blood Thinner since last visit: Has Dressing in Place as Prescribed: Yes Pain Present Now: Yes Electronic Signature(s) Signed: 05/30/2020 12:48:25 PM By: Sandre Kitty Entered By: Sandre Kitty on 05/29/2020 12:43:23 -------------------------------------------------------------------------------- Compression Therapy Details Patient Name: Date of Service: [name] 05/29/2020 12:30 PM Medical Record Number: [id number] Patient Account Number: [id number] Date of Birth/Sex: Treating RN: 09-20-1970 (50 y.o. [name], [name] Primary Care Nell Gales: [name] Other Clinician: Referring Markie Heffernan: Treating Verle Brillhart/Extender: Nyra Market in Treatment: 11 Compression Therapy Performed for Wound  Assessment: Wound #2 Left,Medial Lower Leg Performed By: Clinician Rhae Hammock, RN Compression Type: Four Layer Electronic Signature(s) Signed: 06/06/2020 1:48:37 PM By: Rhae Hammock RN Entered By: Rhae Hammock on 05/29/2020 15:12:54 -------------------------------------------------------------------------------- Encounter Discharge Information Details Patient Name: Date of Service: [name] 05/29/2020 12:30 PM Medical Record Number: [id number] Patient Account Number: [id number] Date of Birth/Sex: Treating RN: Jan 25, 1971 (50 y.o. [name], [name] Primary Care Darris Staiger: Other Clinician: [name] Referring Claudeen Leason: Treating Jenissa Tyrell/Extender: Nyra Market in Treatment: 11 Encounter Discharge Information Items Discharge Condition: Stable Ambulatory Status: Cane Discharge Destination: Home Transportation: Private Auto Accompanied By: self Schedule Follow-up Appointment: Yes Clinical Summary of Care: Patient Declined Electronic Signature(s) Signed: 06/06/2020 1:48:37 PM By: Rhae Hammock RN Entered By: Rhae Hammock on 05/29/2020 15:13:52 -------------------------------------------------------------------------------- Patient/Caregiver Education Details Patient Name: Date of Service: [name] 1/31/2022andnbsp12:30 PM Medical Record Number: [id number] Patient Account Number: [id number] Date of Birth/Gender: Treating RN: 23-Jul-1970 (50 y.o. [name], [name] Primary Care Physician: [name] Other Clinician: Referring Physician: Treating Physician/Extender: Nyra Market in Treatment: 11 Education Assessment Education Provided To: Patient Education Topics Provided Venous: Handouts: Managing Venous Disease and Related Ulcers Methods: Explain/Verbal Responses: State content correctly Wound/Skin Impairment: Handouts: Caring for Your Ulcer Methods:  Explain/Verbal Responses: State content correctly Electronic Signature(s) Signed: 06/06/2020 1:48:37 PM By: Rhae Hammock RN Entered By: Rhae Hammock on 05/29/2020 15:13:36 -------------------------------------------------------------------------------- Wound Assessment Details Patient Name: Date of Service: [name] 05/29/2020 12:30 PM Medical Record Number: [id number] Patient Account Number: [id number] Date of Birth/Sex: Treating RN: 02-25-1971 (50 y.o. [name] Primary Care Kainat Pizana: [name] Other Clinician: Referring Quentina Fronek: Treating Akacia Boltz/Extender: Nyra Market in Treatment: 11 Wound Status Wound Number: 2 Primary Etiology: Trauma, Other Wound Location: Left, Medial Lower Leg Wound Status: Open Wounding Event: Trauma Date Acquired: 05/11/2020 Weeks Of Treatment: 1 Clustered Wound: No Wound Measurements Length: (cm) 3 Width: (cm) 3.2 Depth: (cm) 0.1 Area: (cm) 7.54 Volume: (  cm) 0.754 % Reduction in Area: 0% % Reduction in Volume: 0% Wound Description Classification: Full Thickness Without Exposed Support Structur es Electronic Signature(s) Signed: 05/29/2020 5:05:12 PM By: Levan Hurst RN, BSN Signed: 05/30/2020 12:48:25 PM By: Sandre Kitty Entered By: Sandre Kitty on 05/29/2020 12:43:51 -------------------------------------------------------------------------------- [place] Details Patient Name: Date of Service: [name], Jady D. 05/29/2020 12:30 PM Medical Record Number: [id number] Patient Account Number: [id number] Date of Birth/Sex: Treating RN: 03-05-1971 (50 y.o. [name] Primary Care Wilder Kurowski: [name] Other Clinician: Referring Davelle Anselmi: Treating Jedd Schulenburg/Extender: Nyra Market in Treatment: 11 Vital Signs Time Taken: 12:43 Temperature (F): 97.7 Height (in): 66 Pulse (bpm): 75 Weight (lbs): 327 Respiratory Rate (breaths/min):  17 Body Mass Index (BMI): 52.8 Blood Pressure (mmHg): 154/83 Reference Range: 80 - 120 mg / dl Electronic Signature(s) Signed: 05/30/2020 12:48:25 PM By: Sandre Kitty Entered By: Sandre Kitty on 05/29/2020 12:43:40

## 2020-06-09 ENCOUNTER — Ambulatory Visit (INDEPENDENT_AMBULATORY_CARE_PROVIDER_SITE_OTHER): Payer: Medicaid Other | Admitting: Vascular Surgery

## 2020-06-09 ENCOUNTER — Other Ambulatory Visit: Payer: Self-pay

## 2020-06-09 VITALS — BP 114/81 | HR 85 | Ht 66.0 in | Wt 322.0 lb

## 2020-06-09 DIAGNOSIS — Z95828 Presence of other vascular implants and grafts: Secondary | ICD-10-CM

## 2020-06-09 DIAGNOSIS — I82562 Chronic embolism and thrombosis of left calf muscular vein: Secondary | ICD-10-CM

## 2020-06-09 DIAGNOSIS — I890 Lymphedema, not elsewhere classified: Secondary | ICD-10-CM | POA: Insufficient documentation

## 2020-06-09 DIAGNOSIS — L97221 Non-pressure chronic ulcer of left calf limited to breakdown of skin: Secondary | ICD-10-CM | POA: Diagnosis not present

## 2020-06-09 NOTE — Assessment & Plan Note (Signed)
The patient has stage III lymphedema with skin changes, ulceration, weeping of the tissue, and nonpitting edema despite the appropriate use of compression stockings and leg elevation.  She has had a lymphedema pump previously but this is not functional.  She needs a lymphedema pump which is functional.  We will try to get that obtained for her in the near future at her convenience.  I would also recommend a venous reflux study be done in the near future at her convenience to evaluate her venous system and determine if any further treatment options are necessary for her venous disease.  We discussed this is a lifelong disease that she will not cure but will need to manage from here forward.  We discussed the reason and rationale for the conservative therapies as described above

## 2020-06-09 NOTE — Assessment & Plan Note (Signed)
Also on long-term anticoagulation.

## 2020-06-09 NOTE — Progress Notes (Signed)
Patient ID: [name], female   DOB: 08/21/1970, 50 y.o.   MRN: [id number]  Chief Complaint  Patient presents with  . 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 female.  I am asked to see the patient by Dr. Ellyn Hack for evaluation of chronic leg swelling and discoloration with recurrent ulceration of the left lower extremity as well as lymphedema.  The patient has had chronic leg swelling for many years.  She has a previous history of DVT and it sounds like she has an IVC filter in place that was done about 5 years ago.  She was recommended to be on lifelong anticoagulation couple of years ago.  She has had problems with leg swelling for years.  This has gradually gotten worse.  The right leg is more swollen but the left leg is the one that has the recurrent ulcerations.  Both legs are very painful.  No fevers or chills.  She denies any chest pain or shortness of breath currently.  She has been seeing the wound care center for the last several weeks.  She somewhat recently moved here to [facility].  She has a lymphedema pump but it is broken and she has not used it in some time.  She has been on diuretic therapy with minimal improvement.  She does wear compression socks and has previously been getting Unna boot wraps at the wound care center as well.     Past Medical History:  Diagnosis Date  . CAD (coronary artery disease)    PT STATES - HAD A CARDIAC CATH - NOT TOLD SHE HAD CAD -> week note from [place] indicates history of MI (patient cannot corroborate  . Cellulitis   . CHF (congestive heart failure) ([place])   . DVT (deep venous thrombosis) ([place]) 09/17/2017   Recurrent DVT November, 2020-recommendation was lifelong DOAC  . Generalized anxiety disorder   . H/O gastric ulcer 11/16/2018  . History of small bowel obstruction    In childhood  . Hypertension   . Iron deficiency  anemia due to chronic blood loss    Previously been followed by hematology for iron infusion every 2 weeks and as of 2019; full GI evaluation including capsule endoscopy negative.  . Morbid obesity due to excess calories ([place])   . Osteoarthritis of left knee   . Prediabetes   . Small bowel obstruction ([place])    as a child  . Speech impediment    Stutter / stammer    Past Surgical History:  Procedure Laterality Date  . ABDOMINAL WALL DEFECT REPAIR  1970  . IVC FILTER INSERTION  2017  . OOPHORECTOMY  1996  . OOPHORECTOMY  1997  . PORTACATH PLACEMENT  2014     Family History  Problem Relation Age of Onset  . Diabetes Mellitus II Mother   . COPD Father   . Diabetes Father   . Diabetes Mellitus II Maternal Grandmother   . Breast cancer Paternal Grandfather      Social History   Tobacco Use  . Smoking status: Former Research scientist (life sciences)  . Smokeless tobacco: Never Used  Vaping Use  . Vaping Use: Never used  Substance Use Topics  . Alcohol use: Not Currently  . Drug use: Not Currently  recently moved [facility]  Allergies  Allergen Reactions  . Ace Inhibitors Rash and Other (See Comments)  Make pt bleed  . Aspirin Other (See Comments)    Per patient paperwork: blood clot?  Likely because of chronic DOAC  . Hydromorphone Hives and Itching  . Vancomycin Itching and Rash  . Contrast Media [Iodinated Diagnostic Agents] Hives  . Dilaudid [Hydromorphone Hcl] Hives    Current Outpatient Medications  Medication Sig Dispense Refill  . acetaminophen (TYLENOL) 500 MG tablet Take 1 tablet (500 mg total) by mouth every 6 (six) hours as needed. 60 tablet 0  . amLODipine (NORVASC) 5 MG tablet Take 0.5 tablets (2.5 mg total) by mouth daily. 15 tablet 6  . apixaban (ELIQUIS) 5 MG TABS tablet Take 1 tablet (5 mg total) by mouth 2 (two) times daily. 60 tablet 3  . atorvastatin (LIPITOR) 40 MG tablet Take 1 tablet (40 mg total) by mouth daily. 30 tablet 6  . busPIRone (BUSPAR) 15 MG tablet Take 15 mg  by mouth daily.    . carvedilol (COREG) 25 MG tablet Take 1 tablet (25 mg total) by mouth 2 (two) times daily with a meal. 60 tablet 6  . cholecalciferol (VITAMIN D3) 25 MCG (1000 UNIT) tablet Take 1 tablet (1,000 Units total) by mouth daily. 30 tablet 5  . fluticasone (FLOVENT HFA) 110 MCG/ACT inhaler Inhale 2 puffs into the lungs daily.    . furosemide (LASIX) 80 MG tablet Take 1 tablet (80 mg total) by mouth daily. 30 tablet 3  . omeprazole (PRILOSEC) 20 MG capsule Take 20 mg by mouth daily.    [name] PARoxetine (PAXIL) 10 MG tablet Take 1 tablet (10 mg total) by mouth daily. 30 tablet 6  . potassium chloride SA (KLOR-CON) 20 MEQ tablet Take 1 tablet (20 mEq total) by mouth daily. 30 tablet 6  . PROAIR HFA 108 (90 Base) MCG/ACT inhaler INHALE TWO PUFFS BY MOUTH EVERY 6 HOURS AS NEEDED FOR WHEEZING OR FOR SHORTNESS OF BREATH 8.5 g 0  . spironolactone (ALDACTONE) 25 MG tablet Take 1 tablet (25 mg total) by mouth daily. 30 tablet 6  . valsartan (DIOVAN) 80 MG tablet Take 1 tablet (80 mg total) by mouth daily. 90 tablet 3  . vitamin B-12 (CYANOCOBALAMIN) 1000 MCG tablet Take 1,000 mcg by mouth daily.     No current facility-administered medications for this visit.      REVIEW OF SYSTEMS (Negative unless checked)  Constitutional: '[]'$ Weight loss  '[]'$ Fever  '[]'$ Chills Cardiac: '[]'$ Chest pain   '[]'$ Chest pressure   '[]'$ Palpitations   '[]'$ Shortness of breath when laying flat   '[]'$ Shortness of breath at rest   '[]'$ Shortness of breath with exertion. Vascular:  '[x]'$ Pain in legs with walking   '[x]'$ Pain in legs at rest   '[x]'$ Pain in legs when laying flat   '[]'$ Claudication   '[]'$ Pain in feet when walking  '[]'$ Pain in feet at rest  '[]'$ Pain in feet when laying flat   '[x]'$ History of DVT   '[x]'$ Phlebitis   '[]'$ Swelling in legs   '[]'$ Varicose veins   '[x]'$ Non-healing ulcers Pulmonary:   '[]'$ Uses home oxygen   '[]'$ Productive cough   '[]'$ Hemoptysis   '[]'$ Wheeze  '[]'$ COPD   '[]'$ Asthma Neurologic:  '[]'$ Dizziness  '[]'$ Blackouts   '[]'$ Seizures   '[]'$ History of stroke    '[]'$ History of TIA  '[]'$ Aphasia   '[]'$ Temporary blindness   '[]'$ Dysphagia   '[]'$ Weakness or numbness in arms   '[]'$ Weakness or numbness in legs Musculoskeletal:  '[x]'$ Arthritis   '[]'$ Joint swelling   '[]'$ Joint pain   '[]'$ Low back pain Hematologic:  '[]'$ Easy bruising  '[]'$ Easy bleeding   '[]'$ Hypercoagulable state   '[]'$   Anemic  '[]'$ Hepatitis Gastrointestinal:  '[]'$ Blood in stool   '[]'$ Vomiting blood  '[]'$ Gastroesophageal reflux/heartburn   '[]'$ Abdominal pain Genitourinary:  '[]'$ Chronic kidney disease   '[]'$ Difficult urination  '[]'$ Frequent urination  '[]'$ Burning with urination   '[]'$ Hematuria Skin:  '[]'$ Rashes   '[x]'$ Ulcers   '[x]'$ Wounds Psychological:  '[]'$ History of anxiety   '[]'$  History of major depression.    Physical Exam BP 114/81   Pulse 85   Ht '5\' 6"'$  (1.676 m)   Wt (!) 322 lb (146.1 kg)   BMI 51.97 kg/m  Gen:  WD/WN, NAD. Morbidly obese Head: [place]/AT, No temporalis wasting.  Ear/Nose/Throat: Hearing grossly intact, nares w/o erythema or drainage, oropharynx w/o Erythema/Exudate Eyes: Conjunctiva clear, sclera non-icteric  Neck: trachea midline.  No JVD.  Pulmonary:  Good air movement, respirations not labored, no use of accessory muscles  Cardiac: RRR, no JVD Vascular:  Vessel Right Left  Radial Palpable Palpable                          DP 1+ 1+  PT NP NP   Gastrointestinal:. No masses, surgical incisions, or scars. Musculoskeletal: M/S 5/5 throughout.  Extremities without ischemic changes.  No deformity or atrophy.  Moderate to severe stasis dermatitis changes present bilaterally.  3+ right lower extremity edema, 2+ left lower extremity edema. Neurologic: Sensation grossly intact in extremities.  Symmetrical.  Speech is fluent. Motor exam as listed above. Psychiatric: Judgment intact, Mood & affect appropriate for pt's clinical situation. Dermatologic: Small wound on the left anterior lower leg currently dressed    Radiology MM 3D SCREEN BREAST BILATERAL  Result Date: 05/24/2020 CLINICAL DATA:  Screening. EXAM: DIGITAL  SCREENING BILATERAL MAMMOGRAM WITH TOMO AND CAD COMPARISON:  None. ACR Breast Density Category b: There are scattered areas of fibroglandular density. FINDINGS: There are no findings suspicious for malignancy. The images were evaluated with computer-aided detection. IMPRESSION: No mammographic evidence of malignancy. A result letter of this screening mammogram will be mailed directly to the patient. RECOMMENDATION: Screening mammogram in one year. (Code:SM-B-01Y) BI-RADS CATEGORY  1: Negative. Electronically Signed   By: Lajean Manes M.D.   On: 05/24/2020 12:48    Labs Recent Results (from the past 2160 hour(s))  POCT glucose (manual entry)     Status: Abnormal   Collection Time: 04/07/20  9:45 AM  Result Value Ref Range   POC Glucose 102 (A) 70 - 99 mg/dl  POCT glycosylated hemoglobin (Hb A1C)     Status: Abnormal   Collection Time: 04/07/20  9:45 AM  Result Value Ref Range   Hemoglobin A1C     HbA1c POC (<> result, manual entry)     HbA1c, POC (prediabetic range) 5.7 5.7 - 6.4 %   HbA1c, POC (controlled diabetic range)    CBC with Differential/Platelet     Status: Abnormal   Collection Time: 05/04/20  3:17 PM  Result Value Ref Range   WBC 5.1 4.0 - 10.5 K/uL   RBC 3.75 (L) 3.87 - 5.11 MIL/uL   Hemoglobin 9.8 (L) 12.0 - 15.0 g/dL   HCT 34.0 (L) 36.0 - 46.0 %   MCV 90.7 80.0 - 100.0 fL   MCH 26.1 26.0 - 34.0 pg   MCHC 28.8 (L) 30.0 - 36.0 g/dL   RDW 14.1 11.5 - 15.5 %   Platelets 132 (L) 150 - 400 K/uL   nRBC 0.0 0.0 - 0.2 %   Neutrophils Relative % 79 %   Neutro Abs 4.0 1.7 -  7.7 K/uL   Lymphocytes Relative 13 %   Lymphs Abs 0.7 0.7 - 4.0 K/uL   Monocytes Relative 7 %   Monocytes Absolute 0.4 0.1 - 1.0 K/uL   Eosinophils Relative 1 %   Eosinophils Absolute 0.1 0.0 - 0.5 K/uL   Basophils Relative 0 %   Basophils Absolute 0.0 0.0 - 0.1 K/uL   Immature Granulocytes 0 %   Abs Immature Granulocytes 0.01 0.00 - 0.07 K/uL    Comment: Performed at [facility], [place] [address] [place], [place] [address]  CMP ([place] only)     Status: Abnormal   Collection Time: 05/04/20  3:17 PM  Result Value Ref Range   Sodium 139 135 - 145 mmol/L   Potassium 4.2 3.5 - 5.1 mmol/L   Chloride 103 98 - 111 mmol/L   CO2 27 22 - 32 mmol/L   Glucose, Bld 87 70 - 99 mg/dL    Comment: Glucose reference range applies only to samples taken after fasting for at least 8 hours.   BUN 11 6 - 20 mg/dL   Creatinine 0.76 0.44 - 1.00 mg/dL   Calcium 9.5 8.9 - 10.3 mg/dL   Total Protein 7.9 6.5 - 8.1 g/dL   Albumin 3.5 3.5 - 5.0 g/dL   AST 11 (L) 15 - 41 U/L   ALT <6 0 - 44 U/L   Alkaline Phosphatase 74 38 - 126 U/L   Total Bilirubin 1.0 0.3 - 1.2 mg/dL   GFR, Estimated >60 >60 mL/min    Comment: (NOTE) Calculated using the CKD-EPI Creatinine Equation (2021)    Anion gap 9 5 - 15    Comment: Performed at [facility], [place] [address] [place], [place] [address]  Intrinsic factor antibodies     Status: None   Collection Time: 05/04/20  3:17 PM  Result Value Ref Range   Intrinsic Factor 1.0 0.0 - 1.1 AU/mL    Comment: (NOTE) Performed At: [facility] [place], [place] [id number] Rush Farmer MD [id number]   Anti-parietal antibody     Status: None   Collection Time: 05/04/20  3:17 PM  Result Value Ref Range   Parietal Cell Antibody-IgG 10.8 0.0 - 20.0 Units    Comment: (NOTE)                                Negative    0.0 - 20.0                                Equivocal  20.1 - 24.9                                Positive         >24.9 Parietal Cell Antibodies are found in 90% of patients with pernicious anemia and 30% of first degree relatives with pernicious anemia. Performed At: [facility] [place], [place] [id number] Rush Farmer MD [id number]   Vitamin B12     Status: Abnormal   Collection Time: 05/04/20  3:17 PM  Result Value Ref Range   Vitamin B-12  1,844 (H) 180 - 914 pg/mL    Comment: RESULTS CONFIRMED BY MANUAL DILUTION (NOTE) This assay is not validated for testing neonatal or myeloproliferative syndrome specimens  for Vitamin B12 levels. Performed at [facility], [place] [address], [place], [place] [address]   Erythropoietin     Status: Abnormal   Collection Time: 05/04/20  3:17 PM  Result Value Ref Range   Erythropoietin 40.3 (H) 2.6 - 18.5 mIU/mL    Comment: (NOTE) [employer] UniCel DxI 800 Immunoassay System Values obtained with different assay methods or kits cannot be used interchangeably. Results cannot be interpreted as absolute evidence of the presence or absence of malignant disease. Performed At: [facility] [place], [place] [id number] Rush Farmer MD [id number]   Sedimentation rate     Status: Abnormal   Collection Time: 05/04/20  3:17 PM  Result Value Ref Range   Sed Rate 53 (H) 0 - 22 mm/hr    Comment: Performed at [facility], [place] [name], [place], [place] [address]  Iron and TIBC     Status: Abnormal   Collection Time: 05/04/20  3:18 PM  Result Value Ref Range   Iron 52 41 - 142 ug/dL   TIBC 363 236 - 444 ug/dL   Saturation Ratios 14 (L) 21 - 57 %   UIBC 310 120 - 384 ug/dL    Comment: Performed at [facility], [place] [address] [place], [place] [address]  Ferritin     Status: None   Collection Time: 05/04/20  3:18 PM  Result Value Ref Range   Ferritin 24 11 - 307 ng/mL    Comment: Performed at [facility], [place] [address], [place], [place] [address]  Lipase, blood     Status: None   Collection Time: 05/11/20  5:38 PM  Result Value Ref Range   Lipase 27 11 - 51 U/L    Comment: Performed at [facility], [place] [address] [place], [place] [address]  Comprehensive metabolic panel     Status: Abnormal   Collection Time: 05/11/20  5:38 PM  Result Value Ref Range   Sodium 139  135 - 145 mmol/L   Potassium 3.9 3.5 - 5.1 mmol/L   Chloride 100 98 - 111 mmol/L   CO2 30 22 - 32 mmol/L   Glucose, Bld 92 70 - 99 mg/dL    Comment: Glucose reference range applies only to samples taken after fasting for at least 8 hours.   BUN 9 6 - 20 mg/dL   Creatinine, Ser 0.92 0.44 - 1.00 mg/dL   Calcium 9.2 8.9 - 10.3 mg/dL   Total Protein 7.7 6.5 - 8.1 g/dL   Albumin 3.4 (L) 3.5 - 5.0 g/dL   AST 14 (L) 15 - 41 U/L   ALT 10 0 - 44 U/L   Alkaline Phosphatase 71 38 - 126 U/L   Total Bilirubin 0.9 0.3 - 1.2 mg/dL   GFR, Estimated >60 >60 mL/min    Comment: (NOTE) Calculated using the CKD-EPI Creatinine Equation (2021)    Anion gap 9 5 - 15    Comment: Performed at [facility], [place] [address], [place], [place] [address]  CBC     Status: Abnormal   Collection Time: 05/11/20  5:38 PM  Result Value Ref Range   WBC 6.3 4.0 - 10.5 K/uL   RBC 3.81 (L) 3.87 - 5.11 MIL/uL   Hemoglobin 10.1 (L) 12.0 - 15.0 g/dL   HCT 34.5 (L) 36.0 - 46.0 %   MCV 90.6 80.0 - 100.0 fL   MCH 26.5 26.0 - 34.0 pg  MCHC 29.3 (L) 30.0 - 36.0 g/dL   RDW 14.5 11.5 - 15.5 %   Platelets 165 150 - 400 K/uL   nRBC 0.0 0.0 - 0.2 %    Comment: Performed at [facility], [place] [address], [place], [place] [address]  Urinalysis, Routine w reflex microscopic Urine, Clean Catch     Status: Abnormal   Collection Time: 05/11/20  6:50 PM  Result Value Ref Range   Color, Urine YELLOW YELLOW   APPearance HAZY (A) CLEAR   Specific Gravity, Urine 1.023 1.005 - 1.030   pH 5.0 5.0 - 8.0   Glucose, UA NEGATIVE NEGATIVE mg/dL   Hgb urine dipstick NEGATIVE NEGATIVE   Bilirubin Urine NEGATIVE NEGATIVE   Ketones, ur NEGATIVE NEGATIVE mg/dL   Protein, ur NEGATIVE NEGATIVE mg/dL   Nitrite NEGATIVE NEGATIVE   Leukocytes,Ua NEGATIVE NEGATIVE    Comment: Performed at [place] [address], [place], [place] [address]  Cytology - PAP( [place])     Status: None   Collection Time: 05/22/20  2:39 PM   Result Value Ref Range   High risk HPV Negative    Adequacy      Satisfactory for evaluation; transformation zone component PRESENT.   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  Particular given her size and recurrent ulceration, I would agree with continuing anticoagulation with Eliquis indefinitely.  Compression stockings and elevate her legs to reduce postphlebitic symptoms.  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  Lower limb ulcer, calf, left, limited to breakdown of skin ([place]) Seeing the wound care center and they are doing wraps which sounds like a reasonable option at this point.  Lymphedema The patient has stage III lymphedema with skin changes, ulceration, weeping of the tissue, and nonpitting edema despite the appropriate use of compression stockings and leg elevation.  She has had a lymphedema pump previously but this is not functional.  She needs a lymphedema pump which is functional.  We will try to get that obtained for her in the near future at her convenience.  I would also recommend a venous reflux study be done in the near future at her convenience to evaluate her venous system and determine if any further treatment options are necessary for her venous disease.  We discussed this is a lifelong disease that she will not cure but will need to manage from here forward.  We discussed the reason and rationale for the conservative therapies as described above      Leotis Pain 06/09/2020, 10:53 AM   This note was created with Dragon medical transcription system.  Any errors from dictation are unintentional.

## 2020-06-09 NOTE — Assessment & Plan Note (Signed)
Seeing the wound care center and they are doing wraps which sounds like a reasonable option at this point.

## 2020-06-09 NOTE — Assessment & Plan Note (Signed)
With previous recurrent DVT now with an IVC filter in place she is on long-term anticoagulation.  Particular given her size and recurrent ulceration, I would agree with continuing anticoagulation with Eliquis indefinitely.  Compression stockings and elevate her legs to reduce postphlebitic symptoms.

## 2020-06-09 NOTE — Patient Instructions (Signed)
Lymphedema  Lymphedema is swelling that is caused by the abnormal collection of lymph in the tissues under the skin. Lymph is excess fluid from the tissues in your body that is removed through the lymphatic system. This system is part of your body's defense system (immune system) and includes lymph nodes and lymph vessels. The lymph vessels collect and carry the excess fluid, fats, proteins, and waste from the tissues of the body to the bloodstream. This system also works to clean and remove bacteria and waste products from the body. Lymphedema occurs when the lymphatic system is blocked. When the lymph vessels or lymph nodes are blocked or damaged, lymph does not drain properly. This causes an abnormal buildup of lymph, which leads to swelling in the affected area. This may include the trunk area, or an arm or leg. Lymphedema cannot be cured by medicines, but various methods can be used to help reduce the swelling. What are the causes? The cause of this condition depends on the type of lymphedema that you have.  Primary lymphedema is caused by the absence of lymph vessels or having abnormal lymph vessels at birth.  Secondary lymphedema occurs when lymph vessels are blocked or damaged. Secondary lymphedema is more common. Common causes of lymph vessel blockage include: ? Skin infection, such as cellulitis. ? Infection by parasites (filariasis). ? Injury. ? Radiation therapy. ? Cancer. ? Formation of scar tissue. ? Surgery. What are the signs or symptoms? Symptoms of this condition include:  Swelling of the arm or leg.  A heavy or tight feeling in the arm or leg.  Swelling of the feet, toes, or fingers. Shoes or rings may fit more tightly than before.  Redness of the skin over the affected area.  Limited movement of the affected limb.  Sensitivity to touch or discomfort in the affected limb. How is this diagnosed? This condition may be diagnosed based on:  Your symptoms and medical  history.  A physical exam.  Bioimpedance spectroscopy. In this test, painless electrical currents are used to measure fluid levels in your body.  Imaging tests, such as: ? MRI. ? CT scan. ? Duplex ultrasound. This test uses sound waves to produce images of the vessels and the blood flow on a screen. ? Lymphoscintigraphy. In this test, a low dose of a radioactive substance is injected to trace the flow of lymph through your lymph vessels. ? Lymphangiography. In this test, a contrast dye is injected into the lymph vessel to help show blockages. How is this treated? If an underlying condition is causing the lymphedema, that condition will be treated. For example, antibiotic medicines may be used to treat an infection. Treatment for this condition will depend on the cause of your lymphedema. Treatment may include:  Complete decongestive therapy (CDT). This is done by a certified lymphedema therapist to reduce fluid congestion. This therapy includes: ? Skin care. ? Compression wrapping of the affected area. ? Manual lymph drainage. This is a special massage technique that promotes lymph drainage out of a limb. ? Specific exercises. Certain exercises can help fluid move out of the affected limb.  Compression. Various methods may be used to apply pressure to the affected limb to reduce the swelling. They include: ? Wearing compression stockings or sleeves on the affected limb. ? Wrapping the affected limb with special bandages.  Surgery. This is usually done for severe cases only. For example, surgery may be done if you have trouble moving the limb or if the swelling does not  get better with other treatments.   Follow these instructions at home: Self-care  The affected area is more likely to become injured or infected. Take these steps to help prevent infection: ? Keep the affected area clean and dry. ? Use approved creams or lotions to keep the skin moisturized. ? Protect your skin from  cuts:  Use gloves while cooking or gardening.  Do not walk barefoot.  If you shave the affected area, use an Copy.  Do not wear tight clothes, shoes, or jewelry.  Eat a healthy diet that includes a lot of fruits and vegetables. Activity  Do exercises as told by your health care provider.  Do not sit with your legs crossed.  When possible, keep the affected limb raised (elevated) above the level of your heart.  Avoid carrying things with an arm that is affected by lymphedema. General instructions  Wear compression stockings or sleeves as told by your health care provider.  Note any changes in size of the affected limb. You may be instructed to take regular measurements and keep track of them.  Take over-the-counter and prescription medicines only as told by your health care provider.  If you were prescribed an antibiotic medicine, take or apply it as told by your health care provider. Do not stop using the antibiotic even if you start to feel better or if your condition improves.  Do not use heating pads or ice packs on the affected area.  Avoid having blood draws, IV insertions, or blood pressure checks on the affected limb.  Keep all follow-up visits. This is important. Contact a health care provider if you:  Continue to have swelling in your limb.  Have fluid leaking from the skin of your swollen limb.  Have a cut that does not heal.  Have redness or pain in the affected area.  Develop purplish spots, rash, blisters, or sores (lesions) on your affected limb. Get help right away if you:  Have new swelling in your limb that starts suddenly.  Have shortness of breath or chest pain.  Have a fever or chills. These symptoms may represent a serious problem that is an emergency. Do not wait to see if the symptoms will go away. Get medical help right away. Call your local emergency services (911 in the [place] Do not drive yourself to the  hospital. Summary  Lymphedema is swelling that is caused by the abnormal collection of lymph in the tissues under the skin.  Lymph is fluid from the tissues in your body that is removed through the lymphatic system. This system collects and carries excess fluid, fats, proteins, and wastes from the tissues of the body to the bloodstream.  Lymphedema causes swelling, pain, and redness in the affected area. This may include the trunk area, or an arm or leg.  Treatment for this condition may depend on the cause of your lymphedema. Treatment may include treating the underlying cause, complete decongestive therapy (CDT), compression methods, or surgery. This information is not intended to replace advice given to you by your health care provider. Make sure you discuss any questions you have with your health care provider. Document Revised: 02/09/2020 Document Reviewed: 02/09/2020 [employer] Patient Education  2021 [employer].

## 2020-06-12 ENCOUNTER — Encounter (HOSPITAL_BASED_OUTPATIENT_CLINIC_OR_DEPARTMENT_OTHER): Payer: Medicaid Other | Admitting: Internal Medicine

## 2020-06-14 ENCOUNTER — Ambulatory Visit (INDEPENDENT_AMBULATORY_CARE_PROVIDER_SITE_OTHER): Payer: Medicaid Other

## 2020-06-14 ENCOUNTER — Other Ambulatory Visit: Payer: Self-pay

## 2020-06-14 DIAGNOSIS — R350 Frequency of micturition: Secondary | ICD-10-CM

## 2020-06-14 NOTE — Progress Notes (Signed)
Urine culture not collected at recent GYN visit. Pt here to leave urine specimen for culture. Pt has no concerns today, did not stay for visit with RN.   Apolonio Schneiders RN 06/14/20

## 2020-06-15 ENCOUNTER — Encounter (HOSPITAL_BASED_OUTPATIENT_CLINIC_OR_DEPARTMENT_OTHER): Payer: Medicaid Other | Admitting: Internal Medicine

## 2020-06-15 DIAGNOSIS — I87332 Chronic venous hypertension (idiopathic) with ulcer and inflammation of left lower extremity: Secondary | ICD-10-CM | POA: Diagnosis not present

## 2020-06-15 NOTE — Progress Notes (Signed)
[name], [name] ([id number]) Visit Report for 06/15/2020 Debridement Details Patient Name: Date of Service: [name], [name] 06/15/2020 12:30 PM Medical Record Number: [id number] Patient Account Number: [id number] Date of Birth/Sex: Treating RN: 1971/01/25 (50 y.o. [name], [name] Primary Care Provider: [name] Other Clinician: Referring Provider: Treating Provider/Extender: Nyra Market in Treatment: 13 Debridement Performed for Assessment: Wound #2 Left,Medial Lower Leg Performed By: Clinician Levan Hurst, RN Debridement Type: Chemical/Enzymatic/Mechanical Agent Used: gauze and wound cleanser Level of Consciousness (Pre-procedure): Awake and Alert Pre-procedure Verification/Time Out No Taken: Bleeding: Minimum Response to Treatment: Procedure was tolerated well Level of Consciousness (Post- Awake and Alert procedure): Post Debridement Measurements of Total Wound Length: (cm) 1.2 Width: (cm) 0.9 Depth: (cm) 0.1 Volume: (cm) 0.085 Character of Wound/Ulcer Post Debridement: Improved Post Procedure Diagnosis Same as Pre-procedure Electronic Signature(s) Signed: 06/15/2020 5:29:55 PM By: Linton Ham MD Signed: 06/15/2020 5:51:10 PM By: Deon Pilling Entered By: Deon Pilling on 06/15/2020 13:17:06 -------------------------------------------------------------------------------- HPI Details Patient Name: Date of Service: [name], [name] 06/15/2020 12:30 PM Medical Record Number: [id number] Patient Account Number: [id number] Date of Birth/Sex: Treating RN: 12-Mar-1971 (50 y.o. [name] Primary Care Provider: [name] Other Clinician: Referring Provider: Treating Provider/Extender: Nyra Market in Treatment: 13 History of Present Illness HPI Description: ADMISSION 03/10/2020 This is a 50 year old woman who was recently moved to this area from [place] to help look after her mother who  is ill. She has a several month history of a wound on the left medial lower leg. She was in the emergency room on 9/23 and 10/12. On both occasions she had x-rays that were negative and she was given doxycycline. On one occasion she had an Haematologist wrap. T the diameter of the wound on 10/12 was 6 cm I do not think a stat large today however her o compression stockings no longer fit because of swelling. Furthermore she tells me she has had external compression pumps but they are not working either. She comes with her last note from her primary doctor in [place] from June. She has a large list of prior diagnoses including iron deficiency anemia, anxiety, asthma, hypertension, coronary artery disease with an MI in 2007, cellulitis in 2019, congestive heart failure, history of a DVT on Eliquis with the advice to stay on this "lifelong", gastric ulcer, chronic blood loss history of an iron infusion given in 2019 with a complete GI work-up negative, osteoarthritis of the left knee, cyst history of a small bowel obstruction, upper GI bleed and prediabetes. She comes in with a long list of medications however the only medications she is taking currently are Eliquis 5 mg twice daily and amlodipine 10 mg daily. She is supposed to be taking atorvastatin, Lasix 80 mg a day, isosorbide mononitrate 30 mg a day, Klor-Con 20 mEq a day but she does not appear to be taking any of these. She tells me that she has had difficulty getting a primary doctor ABI in our clinic was 1.17 on the left 03/27/2020; patient has a wound on the left anterior lower leg in the setting of chronic lymphedema. The wound margins have been coming in nicely. 12/6; left anterior lower leg wound in the setting of chronic lymphedema. Her wound is contracting. She brought her stockings which are some form of external compression garment I have never really seen these but she said she paid $70 for them in [place] about a year ago 12/13; this is  a patient with chronic venous insufficiency and lymphedema. She has a wound on her left medial lower leg that is healed today. She has severe hemosiderin deposition. She has her own 30 mm compression stocking 05/19/2020 This is a patient we discharged a little over a month ago. She has severe chronic venous insufficiency and lymphedema. He had a wound on her left medial lower leg. She had her own 30/40 mm compression stockings. She tells me that she was in the ER a week ago related to iron deficiency anemia. As she was leaving the emergency room she tripped and fell on the curb reopening the wound in the exact same place. She also says her stockings are no longer tight enough and looking at the edema in her leg today that certainly have to agree 2/3; left medial lower leg. The wound is smaller. We have her in 4 layer compression. Venous reflux studies are next week I believe on 10 February. We will see her back after this for a nurse visit. 2/17; left medial lower leg this is now divided into 2 smaller. As opposed to what I said last time the patient went to see vein and vascular and they aborted the venous reflux studies. In general people think she has stage III lymphedema we agree with that. The issue that I was not aware of is that the patient has a lymphedema pump but it has not been working. By her description she has not used the pump in more than a year. We discussed stockings with her last week and she is chosen the dual layer compression system Her venous reflux studies are next week on Friday. I do not believe we will be able to fit her into the rechange her dressing so her next appointment will be on Monday she is to wear the new stockings over next weekend Electronic Signature(s) Signed: 06/15/2020 5:29:55 PM By: Linton Ham MD Entered By: Linton Ham on 06/15/2020 13:22:44 -------------------------------------------------------------------------------- Physical Exam  Details Patient Name: Date of Service: [name], [name] 06/15/2020 12:30 PM Medical Record Number: [id number] Patient Account Number: [id number] Date of Birth/Sex: Treating RN: May 23, 1970 (50 y.o. [name] Primary Care Provider: [name] Other Clinician: Referring Provider: Treating Provider/Extender: Nyra Market in Treatment: 13 Constitutional Sitting or standing Blood Pressure is within target range for patient.. Pulse regular and within target range for patient.[name] Respirations regular, non-labored and within target range.. Temperature is normal and within the target range for the patient.[name] Appears in no distress. Cardiovascular Edema control in the upper calf is marginal. She is lipodermatosclerosis distally.. Notes Wound exam; on the left medial lower tibia area. She has feet hemosiderin deposition. The wound is now separated and the 2 areas separated by a layer of normal-looking skin everything looks fine here. There is no evidence of infection no need for debridement Electronic Signature(s) Signed: 06/15/2020 5:29:55 PM By: Linton Ham MD Entered By: Linton Ham on 06/15/2020 13:24:39 -------------------------------------------------------------------------------- Physician Orders Details Patient Name: Date of Service: [name], [name] 06/15/2020 12:30 PM Medical Record Number: [id number] Patient Account Number: [id number] Date of Birth/Sex: Treating RN: October 31, 1970 (50 y.o. [name] Primary Care Provider: Other Clinician: [name] Referring Provider: Treating Provider/Extender: Nyra Market in Treatment: [phone] Verbal / Phone Orders: No Diagnosis Coding ICD-10 Coding Code Description [phone] Non-pressure chronic ulcer of other part of left lower leg with fat layer exposed I89.0 Lymphedema, not elsewhere classified I87.332 Chronic venous hypertension (idiopathic) with  ulcer and  inflammation of left lower extremity Follow-up Appointments ppointment in 2 weeks. - Monday 06/26/2020 Return A Bathing/ Shower/ Hygiene May shower with protection but do not get wound dressing(s) wet. - use cast protector to keep wrap dry in the shower Edema Control - Lymphedema / SCD / Other Bilateral Lower Extremities Elevate legs to the level of the heart or above for 30 minutes daily and/or when sitting, a frequency of: Avoid standing for long periods of time. Patient to wear own compression stockings every day. - right leg daily Exercise regularly Wound Treatment Wound #2 - Lower Leg Wound Laterality: Left, Medial Peri-Wound Care: Sween Lotion (Moisturizing lotion) 1 x Per Week/30 Days Discharge Instructions: Apply moisturizing lotion as directed Prim Dressing: Hydrofera Blue Classic Foam, 2x2 in 1 x Per Week/30 Days ary Discharge Instructions: Moisten with saline prior to applying to wound bed Secondary Dressing: Woven Gauze Sponge, Non-Sterile 4x4 in 1 x Per Week/30 Days Discharge Instructions: Apply over primary dressing as directed. Secondary Dressing: ABD Pad, 5x9 1 x Per Week/30 Days Discharge Instructions: Apply over primary dressing as directed. Compression Wrap: FourPress (4 layer compression wrap) 1 x Per Week/30 Days Discharge Instructions: Apply four layer compression as directed. Compression Wrap: Unna boot first layer 1 x Per Week/30 Days Discharge Instructions: Apply unna boot first layer at upper portion of lower leg. Compression Stockings: Mediven Dual Layer Compression System (DME) Left Leg Compression Amount: 30-40 mmHG Discharge Instructions: Apply compression stocking daily as instructed. Apply first thing in the morning, remove at night before bed. Wound #3 - Lower Leg Wound Laterality: Left, Anterior Peri-Wound Care: Sween Lotion (Moisturizing lotion) 1 x Per Week/30 Days Discharge Instructions: Apply moisturizing lotion as directed Prim Dressing:  Hydrofera Blue Classic Foam, 2x2 in 1 x Per Week/30 Days ary Discharge Instructions: Moisten with saline prior to applying to wound bed Secondary Dressing: Woven Gauze Sponge, Non-Sterile 4x4 in 1 x Per Week/30 Days Discharge Instructions: Apply over primary dressing as directed. Secondary Dressing: ABD Pad, 5x9 1 x Per Week/30 Days Discharge Instructions: Apply over primary dressing as directed. Compression Wrap: FourPress (4 layer compression wrap) 1 x Per Week/30 Days Discharge Instructions: Apply four layer compression as directed. Compression Wrap: Unna boot first layer 1 x Per Week/30 Days Discharge Instructions: Apply unna boot first layer at upper portion of lower leg. Electronic Signature(s) Signed: 06/15/2020 5:29:55 PM By: Linton Ham MD Signed: 06/15/2020 5:51:10 PM By: Deon Pilling Entered By: Deon Pilling on 06/15/2020 13:18:17 -------------------------------------------------------------------------------- Problem List Details Patient Name: Date of Service: [name], [name] 06/15/2020 12:30 PM Medical Record Number: [id number] Patient Account Number: [id number] Date of Birth/Sex: Treating RN: [birth year] (50 y.o. 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 06/15/2020 12:30 PM Medical Record Number: [id number] Patient Account Number: [id number] Date of Birth/Sex: Treating RN: 08-18-1970 (50 y.o. [name] Primary Care Provider: [name] Other Clinician: Referring Provider: Treating  Provider/Extender: Nyra Market in Treatment: 13 Subjective History of Present Illness (HPI) ADMISSION 03/10/2020 This is a 50 year old woman who was recently moved to this area from [place] to help look after her mother who is ill. She has a several month history of a wound on the left medial lower leg. She was in the emergency room on 9/23 and 10/12. On both occasions she had x-rays that were negative and she was given doxycycline. On one occasion she had an Haematologist wrap. T the diameter of the wound on 10/12 was 6 cm I do not think a stat large today however her o compression stockings no longer fit because of swelling. Furthermore she tells me she has had external compression pumps but they are not working either. She comes with her last note from her primary doctor in [place] from June. She has a large list of prior diagnoses including iron deficiency anemia, anxiety, asthma, hypertension, coronary artery disease with an MI in 2007, cellulitis in 2019, congestive heart failure, history of a DVT on Eliquis with the advice to stay on this "lifelong", gastric ulcer, chronic blood loss history of an iron infusion given in 2019 with a complete GI work-up negative, osteoarthritis of the left knee, cyst history of a small bowel obstruction, upper GI bleed and prediabetes. She comes in with a long list of medications however the only medications she is taking currently are Eliquis 5 mg twice daily and amlodipine 10 mg daily. She is supposed to be taking  atorvastatin, Lasix 80 mg a day, isosorbide mononitrate 30 mg a day, Klor-Con 20 mEq a day but she does not appear to be taking any of these. She tells me that she has had difficulty getting a primary doctor ABI in our clinic was 1.17 on the left 03/27/2020; patient has a wound on the left anterior lower leg in the setting of chronic lymphedema. The wound margins have been coming in nicely. 12/6; left anterior lower leg wound in the setting of chronic lymphedema. Her wound is contracting. She brought her stockings which are some form of external compression garment I have never really seen these but she said she paid $70 for them in [place] about a year ago 12/13; this is a patient with chronic venous insufficiency and lymphedema. She has a wound on her left medial lower leg that is healed today. She has severe hemosiderin deposition. She has her own 30 mm compression stocking 05/19/2020 This is a patient we discharged a little over a month ago. She has severe chronic venous insufficiency and lymphedema. He had a wound on her left medial lower leg. She had her own 30/40 mm compression stockings. She tells me that she was in the ER a week ago related to iron deficiency anemia. As she was leaving the emergency room she tripped and fell on the curb reopening the wound in the exact same place. She also says her stockings are no longer tight enough and looking at the edema in her leg today that certainly have to agree 2/3; left medial lower leg. The wound is smaller. We have her in 4 layer compression. Venous reflux studies are next week I believe on 10 February. We will see her back after this for a nurse visit. 2/17; left medial lower leg this is now divided into 2 smaller. As opposed to what I said last time the patient went to see vein and vascular and they aborted the venous reflux studies. In general people  think she has stage III lymphedema we agree with that. The issue that I was not aware of is  that the patient has a lymphedema pump but it has not been working. By her description she has not used the pump in more than a year. We discussed stockings with her last week and she is chosen the dual layer compression system Her venous reflux studies are next week on Friday. I do not believe we will be able to fit her into the rechange her dressing so her next appointment will be on Monday she is to wear the new stockings over next weekend Objective Constitutional Sitting or standing Blood Pressure is within target range for patient.. Pulse regular and within target range for patient.[name] Respirations regular, non-labored and within target range.. Temperature is normal and within the target range for the patient.[name] Appears in no distress. Vitals Time Taken: 12:50 PM, Height: 66 in, Weight: 327 lbs, BMI: 52.8, Temperature: 97.9 F, Pulse: 83 bpm, Respiratory Rate: 18 breaths/min, Blood Pressure: 132/76 mmHg. Cardiovascular Edema control in the upper calf is marginal. She is lipodermatosclerosis distally.. General Notes: Wound exam; on the left medial lower tibia area. She has feet hemosiderin deposition. The wound is now separated and the 2 areas separated by a layer of normal-looking skin everything looks fine here. There is no evidence of infection no need for debridement Integumentary (Hair, Skin) Wound #2 status is Open. Original cause of wound was Trauma. The wound is located on the Left,Medial Lower Leg. The wound measures 1.2cm length x 0.9cm width x 0.1cm depth; 0.848cm^2 area and 0.085cm^3 volume. There is Fat Layer (Subcutaneous Tissue) exposed. There is no tunneling or undermining noted. There is a medium amount of serosanguineous drainage noted. The wound margin is distinct with the outline attached to the wound base. There is large (67-100%) red granulation within the wound bed. There is no necrotic tissue within the wound bed. Wound #3 status is Open. Original cause of wound was  Gradually Appeared. The wound is located on the Left,Anterior Lower Leg. The wound measures 0.7cm length x 0.5cm width x 0.1cm depth; 0.275cm^2 area and 0.027cm^3 volume. There is Fat Layer (Subcutaneous Tissue) exposed. There is no tunneling or undermining noted. There is a small amount of serosanguineous drainage noted. The wound margin is flat and intact. There is medium (34-66%) pink granulation within the wound bed. There is a medium (34-66%) amount of necrotic tissue within the wound bed. Assessment Active Problems ICD-10 Non-pressure chronic ulcer of other part of left lower leg with fat layer exposed Lymphedema, not elsewhere classified Chronic venous hypertension (idiopathic) with ulcer and inflammation of left lower extremity Procedures Wound #2 Pre-procedure diagnosis of Wound #2 is a Trauma, Other located on the Left,Medial Lower Leg . There was a Chemical/Enzymatic/Mechanical debridement performed by Levan Hurst, RN.. Other agent used was gauze and wound cleanser. A Minimum amount of bleeding was controlled with N/A. The procedure was tolerated well. Post Debridement Measurements: 1.2cm length x 0.9cm width x 0.1cm depth; 0.085cm^3 volume. Character of Wound/Ulcer Post Debridement is improved. Post procedure Diagnosis Wound #2: Same as Pre-Procedure Pre-procedure diagnosis of Wound #2 is a Trauma, Other located on the Left,Medial Lower Leg . There was a Four Layer Compression Therapy Procedure by Baruch Gouty, RN. Post procedure Diagnosis Wound #2: Same as Pre-Procedure Wound #3 Pre-procedure diagnosis of Wound #3 is a Venous Leg Ulcer located on the Left,Anterior Lower Leg . There was a Four Layer Compression Therapy Procedure by Baruch Gouty, RN.  Post procedure Diagnosis Wound #3: Same as Pre-Procedure Plan Follow-up Appointments: Return Appointment in 2 weeks. - Monday 06/26/2020 Bathing/ Shower/ Hygiene: May shower with protection but do not get wound dressing(s)  wet. - use cast protector to keep wrap dry in the shower Edema Control - Lymphedema / SCD / Other: Elevate legs to the level of the heart or above for 30 minutes daily and/or when sitting, a frequency of: Avoid standing for long periods of time. Patient to wear own compression stockings every day. - right leg daily Exercise regularly WOUND #2: - Lower Leg Wound Laterality: Left, Medial Peri-Wound Care: Sween Lotion (Moisturizing lotion) 1 x Per Week/30 Days Discharge Instructions: Apply moisturizing lotion as directed Prim Dressing: Hydrofera Blue Classic Foam, 2x2 in 1 x Per Week/30 Days ary Discharge Instructions: Moisten with saline prior to applying to wound bed Secondary Dressing: Woven Gauze Sponge, Non-Sterile 4x4 in 1 x Per Week/30 Days Discharge Instructions: Apply over primary dressing as directed. Secondary Dressing: ABD Pad, 5x9 1 x Per Week/30 Days Discharge Instructions: Apply over primary dressing as directed. Com pression Wrap: FourPress (4 layer compression wrap) 1 x Per Week/30 Days Discharge Instructions: Apply four layer compression as directed. Com pression Wrap: Unna boot first layer 1 x Per Week/30 Days Discharge Instructions: Apply unna boot first layer at upper portion of lower leg. Com pression Stockings: Mediven Dual Layer Compression System (DME) Compression Amount: 30-40 mmHg (left) Discharge Instructions: Apply compression stocking daily as instructed. Apply first thing in the morning, remove at night before bed. WOUND #3: - Lower Leg Wound Laterality: Left, Anterior Peri-Wound Care: Sween Lotion (Moisturizing lotion) 1 x Per Week/30 Days Discharge Instructions: Apply moisturizing lotion as directed Prim Dressing: Hydrofera Blue Classic Foam, 2x2 in 1 x Per Week/30 Days ary Discharge Instructions: Moisten with saline prior to applying to wound bed Secondary Dressing: Woven Gauze Sponge, Non-Sterile 4x4 in 1 x Per Week/30 Days Discharge Instructions: Apply  over primary dressing as directed. Secondary Dressing: ABD Pad, 5x9 1 x Per Week/30 Days Discharge Instructions: Apply over primary dressing as directed. Com pression Wrap: FourPress (4 layer compression wrap) 1 x Per Week/30 Days Discharge Instructions: Apply four layer compression as directed. Com pression Wrap: Unna boot first layer 1 x Per Week/30 Days Discharge Instructions: Apply unna boot first layer at upper portion of lower leg. 1. We continued with Hydrofera Blue as the primary dressing/ABDs under 4-layer compression 2. Next week she will have a reflux studies and she will need to replace the [facility] with her stockings on top. These would be new dual layer stockings that we ordered today. She is going to need 30/40 level compression 3. I was not aware or at least did not record that the patient had pumps previously that were broken she has not use them a long time. Vein and vascular said they would look into trying to replace these. As I understand things insurance is only replaced these things after a period of timeo 5 years Electronic Signature(s) Signed: 06/15/2020 5:29:55 PM By: Linton Ham MD Entered By: Linton Ham on 06/15/2020 13:26:13 -------------------------------------------------------------------------------- SuperBill Details Patient Name: Date of Service: [name], [name] 06/15/2020 Medical Record Number: [id number] Patient Account Number: [id number] Date of Birth/Sex: Treating RN: 09/19/1970 (50 y.o. [name] Primary Care Provider: [name] Other Clinician: Referring Provider: Treating Provider/Extender: Nyra Market in Treatment: 13 Diagnosis Coding ICD-10 Codes Code Description [id number] Non-pressure chronic ulcer of other part of left lower leg  with fat layer exposed I89.0 Lymphedema, not elsewhere classified I87.332 Chronic venous hypertension (idiopathic) with ulcer and inflammation of left lower  extremity Facility Procedures CPT4 Code: [id number] Description: (Facility Use Only) [phone] - APPLY MULTLAY COMPRS LWR LT LEG Modifier: Quantity: 1 Physician Procedures : CPT4 Code Description Modifier [id number] - WC PHYS LEVEL 3 - EST PT ICD-10 Diagnosis Description L97.822 Non-pressure chronic ulcer of other part of left lower leg with fat layer exposed I89.0 Lymphedema, not elsewhere classified I87.332 Chronic  venous hypertension (idiopathic) with ulcer and inflammation of left lower extremity Quantity: 1 Electronic Signature(s) Signed: 06/15/2020 5:29:55 PM By: Linton Ham MD Signed: 06/15/2020 5:51:10 PM By: Deon Pilling Entered By: Deon Pilling on 06/15/2020 15:14:42

## 2020-06-16 ENCOUNTER — Ambulatory Visit: Payer: Medicaid Other | Attending: Internal Medicine | Admitting: Internal Medicine

## 2020-06-16 ENCOUNTER — Encounter: Payer: Self-pay | Admitting: Internal Medicine

## 2020-06-16 ENCOUNTER — Other Ambulatory Visit: Payer: Self-pay

## 2020-06-16 VITALS — BP 99/64 | HR 73 | Resp 16 | Ht 66.0 in | Wt 349.2 lb

## 2020-06-16 DIAGNOSIS — Z86718 Personal history of other venous thrombosis and embolism: Secondary | ICD-10-CM | POA: Diagnosis not present

## 2020-06-16 DIAGNOSIS — G8929 Other chronic pain: Secondary | ICD-10-CM

## 2020-06-16 DIAGNOSIS — Z114 Encounter for screening for human immunodeficiency virus [HIV]: Secondary | ICD-10-CM

## 2020-06-16 DIAGNOSIS — Z23 Encounter for immunization: Secondary | ICD-10-CM

## 2020-06-16 DIAGNOSIS — L97909 Non-pressure chronic ulcer of unspecified part of unspecified lower leg with unspecified severity: Secondary | ICD-10-CM | POA: Diagnosis not present

## 2020-06-16 DIAGNOSIS — M546 Pain in thoracic spine: Secondary | ICD-10-CM | POA: Diagnosis not present

## 2020-06-16 MED ORDER — LIDOCAINE 5 % EX PTCH: 1.0000 | MEDICATED_PATCH | CUTANEOUS | 0 refills | Status: DC

## 2020-06-16 MED ORDER — METHOCARBAMOL 500 MG PO TABS: 500.0000 mg | ORAL_TABLET | Freq: Every day | ORAL | 1 refills | Status: DC | PRN

## 2020-06-16 MED ORDER — APIXABAN 5 MG PO TABS: 5.0000 mg | ORAL_TABLET | Freq: Two times a day (BID) | ORAL | 5 refills | Status: DC

## 2020-06-16 NOTE — Progress Notes (Signed)
Patient ID: [name], female    DOB: 05/28/1970  MRN: [id number]  CC: Medication Management   Subjective: [name] is a 50 y.o. female who presents for urgent care visit for back pain and medication refill. 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  EGD and colonoscopy negative, chronic ulcer left lower extremity, asthma  Patient presents complaining of pain in the middle of her lower back which started in 2019 after she fell on some black ice.  She points to the mid thoracic spine just below her bra strap.  At the time of her fall, she was seen in the emergency room and states she was told that she has an "R1-R2."  I asked whether she sustained a fracture and she said no.   -Then however pain has been constant.  She rates it as a 10 out of 10.  The pain does not radiate.  It is worse when she stands and when she is bending over.  She has been using BenGay, over-the-counter muscle patch and Tylenol which she does not find helpful.  She is requesting some Percocet or a muscle relaxant.    She is requesting refill on Eliquis. She requests screening test for HIV. Patient Active Problem List   Diagnosis Date Noted  . Lower limb ulcer, calf, left, limited to breakdown of skin ([place]) 06/09/2020  . Lymphedema 06/09/2020  . Iron deficiency anemia due to chronic blood loss 05/10/2020  . 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 Venous insufficiency 04/13/2020  . Presence of IVC filter 04/13/2020  . Chronic anemia 04/13/2020  . Resistant hypertension 04/13/2020  . (HFpEF) heart failure with preserved ejection fraction ([place]) 04/13/2020  . CAD (coronary artery  disease) 11/17/2019     Current Outpatient Medications on File Prior to Visit  Medication Sig Dispense Refill  . acetaminophen (TYLENOL) 500 MG tablet Take 1 tablet (500 mg total) by mouth every 6 (six) hours as needed. 60 tablet 0  . amLODipine (NORVASC) 5 MG tablet Take 0.5 tablets (2.5 mg total) by mouth daily. 15 tablet 6  . apixaban (ELIQUIS) 5 MG TABS tablet Take 1 tablet (5 mg total) by mouth 2 (two) times daily. 60 tablet 3  . atorvastatin (LIPITOR) 40 MG tablet Take 1 tablet (40 mg total) by mouth daily. 30 tablet 6  . busPIRone (BUSPAR) 15 MG tablet Take 15 mg by mouth daily.    . carvedilol (COREG) 25 MG tablet Take 1 tablet (25 mg total) by mouth 2 (two) times daily with a meal. 60 tablet 6  . cholecalciferol (VITAMIN D3) 25 MCG (1000 UNIT) tablet Take 1 tablet (1,000 Units total) by mouth daily. 30 tablet 5  . fluticasone (FLOVENT HFA) 110 MCG/ACT inhaler Inhale 2 puffs into the lungs daily.    . furosemide (LASIX) 80 MG tablet Take 1 tablet (80 mg total) by mouth daily. 30 tablet 3  . omeprazole (PRILOSEC) 20 MG capsule Take 20 mg by mouth daily.    [name] PARoxetine (PAXIL) 10 MG tablet Take 1 tablet (10 mg total) by mouth daily. 30 tablet 6  . potassium chloride SA (KLOR-CON) 20 MEQ tablet Take 1 tablet (20 mEq total) by mouth  daily. 30 tablet 6  . PROAIR HFA 108 (90 Base) MCG/ACT inhaler INHALE TWO PUFFS BY MOUTH EVERY 6 HOURS AS NEEDED FOR WHEEZING OR FOR SHORTNESS OF BREATH 8.5 g 0  . spironolactone (ALDACTONE) 25 MG tablet Take 1 tablet (25 mg total) by mouth daily. 30 tablet 6  . valsartan (DIOVAN) 80 MG tablet Take 1 tablet (80 mg total) by mouth daily. 90 tablet 3  . vitamin B-12 (CYANOCOBALAMIN) 1000 MCG tablet Take 1,000 mcg by mouth daily.     No current facility-administered medications on file prior to visit.    Allergies  Allergen Reactions  . Ace Inhibitors Rash and Other (See Comments)    Make pt bleed  . Aspirin Other (See Comments)    Per patient paperwork:  blood clot?  Likely because of chronic DOAC  . Hydromorphone Hives and Itching  . Vancomycin Itching and Rash  . Contrast Media [Iodinated Diagnostic Agents] Hives  . Dilaudid [Hydromorphone Hcl] Hives    Social History   Socioeconomic History  . Marital status: Single    Spouse name: Not on file  . Number of children: 0  . Years of education: Not on file  . Highest education level: 12th grade  Occupational History  . Occupation: unemployed on [employer] Use  . Smoking status: Former Research scientist (life sciences)  . Smokeless tobacco: Never Used  Vaping Use  . Vaping Use: Never used  Substance and Sexual Activity  . Alcohol use: Not Currently  . Drug use: Not Currently  . Sexual activity: Not Currently  Other Topics Concern  . Not on file  Social History Narrative  . Not on file   Social Determinants of Health   Financial Resource Strain: Not on file  Food Insecurity: No Food Insecurity  . Worried About Charity fundraiser in the Last Year: Never true  . Ran Out of Food in the Last Year: Never true  Transportation Needs: No Transportation Needs  . Lack of Transportation (Medical): No  . Lack of Transportation (Non-Medical): No  Physical Activity: Not on file  Stress: Not on file  Social Connections: Not on file  Intimate Partner Violence: Not on file    Family History  Problem Relation Age of Onset  . Diabetes Mellitus II Mother   . COPD Father   . Diabetes Father   . Diabetes Mellitus II Maternal Grandmother   . Breast cancer Paternal Grandfather     Past Surgical History:  Procedure Laterality Date  . ABDOMINAL WALL DEFECT REPAIR  1970  . IVC FILTER INSERTION  2017  . OOPHORECTOMY  1996  . OOPHORECTOMY  1997  . PORTACATH PLACEMENT  2014    ROS: Review of Systems Negative except as stated above  PHYSICAL EXAM: BP 99/64   Pulse 73   Resp 16   Ht '5\' 6"'$  (1.676 m)   Wt (!) 349 lb 3.2 oz (158.4 kg)   SpO2 95%   BMI 56.36 kg/m   Wt Readings from Last 3  Encounters:  06/16/20 (!) 349 lb 3.2 oz (158.4 kg)  06/09/20 (!) 322 lb (146.1 kg)  05/22/20 (!) 326 lb 14.4 oz (148.3 kg)    Physical Exam  General appearance - alert, well appearing, morbidly obese middle-age African-American female and in no distress Musculoskeletal -patient with mild point tenderness on palpation of the mid thoracic spine right at the area where the bra is fastened.  No surrounding paraspinal muscle tenderness.    CMP Latest Ref Rng &  Units 05/11/2020 05/04/2020 01/20/2020  Glucose 70 - 99 mg/dL 92 87 98  BUN 6 - 20 mg/dL '9 11 9  '$ Creatinine 0.44 - 1.00 mg/dL 0.92 0.76 0.75  Sodium 135 - 145 mmol/L 139 139 140  Potassium 3.5 - 5.1 mmol/L 3.9 4.2 3.6  Chloride 98 - 111 mmol/L 100 103 103  CO2 22 - 32 mmol/L '30 27 28  '$ Calcium 8.9 - 10.3 mg/dL 9.2 9.5 9.3  Total Protein 6.5 - 8.1 g/dL 7.7 7.9 7.5  Total Bilirubin 0.3 - 1.2 mg/dL 0.9 1.0 1.3(H)  Alkaline Phos 38 - 126 U/L 71 74 74  AST 15 - 41 U/L 14(L) 11(L) 16  ALT 0 - 44 U/L 10 <6 11   Lipid Panel  No results found for: CHOL, TRIG, HDL, CHOLHDL, VLDL, LDLCALC, LDLDIRECT  CBC    Component Value Date/Time   WBC 6.3 05/11/2020 1738   RBC 3.81 (L) 05/11/2020 1738   HGB 10.1 (L) 05/11/2020 1738   HCT 34.5 (L) 05/11/2020 1738   PLT 165 05/11/2020 1738   MCV 90.6 05/11/2020 1738   MCH 26.5 05/11/2020 1738   MCHC 29.3 (L) 05/11/2020 1738   RDW 14.5 05/11/2020 1738   LYMPHSABS 0.7 05/04/2020 1517   MONOABS 0.4 05/04/2020 1517   EOSABS 0.1 05/04/2020 1517   BASOSABS 0.0 05/04/2020 1517    ASSESSMENT AND PLAN:  1. Chronic midline thoracic back pain I recommend getting x-ray of the thoracic spine to evaluate further.  She may benefit from some physical therapy.  I have prescribed some lidocaine patches for her to use.  I went over how to use it.  Given muscle relaxant in the form of Robaxin to use as needed.  Warned that the medicine can cause some drowsiness. - lidocaine (LIDODERM) 5 %; Place 1 patch onto the  skin daily. Remove & Discard patch within 12 hours or as directed by MD  Dispense: 30 patch; Refill: 0 - methocarbamol (ROBAXIN) 500 MG tablet; Take 1 tablet (500 mg total) by mouth daily as needed for muscle spasms.  Dispense: 30 tablet; Refill: 1 - DG Thoracic Spine W/Swimmers; Future  2. Screening for HIV (human immunodeficiency virus) - HIV Antibody (routine testing w rflx)  3. Need for immunization against influenza - Flu Vaccine QUAD 36+ mos IM  4. History of DVT (deep vein thrombosis) - apixaban (ELIQUIS) 5 MG TABS tablet; Take 1 tablet (5 mg total) by mouth 2 (two) times daily.  Dispense: 60 tablet; Refill: 5  5. Need for Tdap vaccination    Patient was given the opportunity to ask questions.  Patient verbalized understanding of the plan and was able to repeat key elements of the plan.   Orders Placed This Encounter  Procedures  . Tdap vaccine greater than or equal to 7yo IM  . Flu Vaccine QUAD 36+ mos IM     Requested Prescriptions    No prescriptions requested or ordered in this encounter    No follow-ups on file.  [name], MD, FACP

## 2020-06-16 NOTE — Progress Notes (Signed)
[name], [name] ([id number]) Visit Report for 06/15/2020 Arrival Information Details Patient Name: Date of Service: [name], [name] 06/15/2020 12:30 PM Medical Record Number: [id number] Patient Account Number: [id number] Date of Birth/Sex: Treating RN: [birth year] (50 y.o. [name] Primary Care Shizuo Biskup: [name] Other Clinician: Referring Harry Bark: Treating Briyonna Omara/Extender: Nyra Market in Treatment: 64 Visit Information History Since Last Visit Added or deleted any medications: No Patient Arrived: Ambulatory Any new allergies or adverse reactions: No Arrival Time: 12:49 Had a fall or experienced change in No Accompanied By: alone activities of daily living that may affect Transfer Assistance: None risk of falls: Patient Identification Verified: Yes Signs or symptoms of abuse/neglect since last visito No Secondary Verification Process Completed: Yes Hospitalized since last visit: No Patient Requires Transmission-Based Precautions: No Implantable device outside of the clinic excluding No Patient Has Alerts: Yes cellular tissue based products placed in the center Patient Alerts: Patient on Blood Thinner since last visit: Has Dressing in Place as Prescribed: Yes Has Compression in Place as Prescribed: No Pain Present Now: No Electronic Signature(s) Signed: 06/16/2020 4:25:56 PM By: Levan Hurst RN, BSN Entered By: Levan Hurst on 06/15/2020 12:50:17 -------------------------------------------------------------------------------- Compression Therapy Details Patient Name: Date of Service: [name], [name] 06/15/2020 12:30 PM Medical Record Number: [id number] Patient Account Number: [id number] Date of Birth/Sex: Treating RN: [birth year] (50 y.o. [name] Primary Care Nosson Wender: [name] Other Clinician: Referring Derika Eckles: Treating Kostantinos Tallman/Extender: Nyra Market in Treatment:  13 Compression Therapy Performed for Wound Assessment: Wound #2 Left,Medial Lower Leg Performed By: Clinician Baruch Gouty, RN Compression Type: Four Layer Post Procedure Diagnosis Same as Pre-procedure Electronic Signature(s) Signed: 06/15/2020 5:51:10 PM By: Deon Pilling Entered By: Deon Pilling on 06/15/2020 13:09:57 -------------------------------------------------------------------------------- Compression Therapy Details Patient Name: Date of Service: [name], [name] 06/15/2020 12:30 PM Medical Record Number: [id number] Patient Account Number: [id number] Date of Birth/Sex: Treating RN: 1970-12-22 (50 y.o. [name] Primary Care Basilio Meadow: [name] Other Clinician: Referring Damondre Pfeifle: Treating Willette Mudry/Extender: Nyra Market in Treatment: 13 Compression Therapy Performed for Wound Assessment: Wound #3 Left,Anterior Lower Leg Performed By: Clinician Baruch Gouty, RN Compression Type: Four Layer Post Procedure Diagnosis Same as Pre-procedure Electronic Signature(s) Signed: 06/15/2020 5:51:10 PM By: Deon Pilling Entered By: Deon Pilling on 06/15/2020 13:09:57 -------------------------------------------------------------------------------- Encounter Discharge Information Details Patient Name: Date of Service: Megan Session D. 06/15/2020 12:30 PM Medical Record Number: [id number] Patient Account Number: [id number] Date of Birth/Sex: Treating RN: August 10, 1970 (49 y.o. [name] Primary Care Bowen Kia: [name] Other Clinician: Referring Madelene Kaatz: Treating Mikale Silversmith/Extender: Nyra Market in Treatment: 13 Encounter Discharge Information Items Post Procedure Vitals Discharge Condition: Stable Temperature (F): 97.9 Ambulatory Status: Ambulatory Pulse (bpm): 83 Discharge Destination: Home Respiratory Rate (breaths/min): 18 Transportation: Private Auto Blood Pressure (mmHg):  132/76 Accompanied By: self Schedule Follow-up Appointment: Yes Clinical Summary of Care: Patient Declined Electronic Signature(s) Signed: 06/15/2020 6:31:13 PM By: Baruch Gouty RN, BSN Entered By: Baruch Gouty on 06/15/2020 13:36:10 -------------------------------------------------------------------------------- Lower Extremity Assessment Details Patient Name: Date of Service: Megan Session D. 06/15/2020 12:30 PM Medical Record Number: [id number] Patient Account Number: [id number] Date of Birth/Sex: Treating RN: 05-22-1970 (50 y.o. [name] Primary Care Grafton Warzecha: [name] Other Clinician: Referring Lavert Matousek: Treating Kiyomi Pallo/Extender: Nyra Market in Treatment: 13 Edema Assessment Assessed: [name]: No] [name]: No] Edema: [Left: Ye] [Right: s] Calf Left: Right: Point of Measurement: 30 cm From Medial  Instep 51.5 cm Ankle Left: Right: Point of Measurement: 11 cm From Medial Instep 23 cm Knee To Floor Left: Right: From Medial Instep 41 cm Vascular Assessment Pulses: Dorsalis Pedis Palpable: [Left:Yes] Electronic Signature(s) Signed: 06/15/2020 5:51:10 PM By: Deon Pilling Signed: 06/16/2020 4:25:56 PM By: Levan Hurst RN, BSN Entered By: Deon Pilling on 06/15/2020 13:14:33 -------------------------------------------------------------------------------- Multi Wound Chart Details Patient Name: Date of Service: [name], [name] 06/15/2020 12:30 PM Medical Record Number: [id number] Patient Account Number: [id number] Date of Birth/Sex: Treating RN: [birth year] (50 y.o. [name], [name] Primary Care Brice Potteiger: [name] Other Clinician: Referring Jazmon Kos: Treating Narek Kniss/Extender: Nyra Market in Treatment: 13 Vital Signs Height(in): 66 Pulse(bpm): 83 Weight(lbs): 341 Blood Pressure(mmHg): 132/76 Body Mass Index(BMI): 53 Temperature(F): 97.9 Respiratory Rate(breaths/min):  18 Photos: [2:No Photos Left, Medial Lower Leg] [3:No Photos Left, Anterior Lower Leg] [N/A:N/A N/A] Wound Location: [2:Trauma] [3:Gradually Appeared] [N/A:N/A] Wounding Event: [2:Trauma, Other] [3:Venous Leg Ulcer] [N/A:N/A] Primary Etiology: [2:Sleep Apnea, Congestive Heart] [3:Sleep Apnea, Congestive Heart] [N/A:N/A] Comorbid History: [2:Failure, Coronary Artery Disease, Hypertension 05/11/2020] [3:Failure, Coronary Artery Disease, Hypertension 06/15/2020] [N/A:N/A] Date Acquired: [2:3] [3:0] [N/A:N/A] Weeks of Treatment: [2:Open] [3:Open] [N/A:N/A] Wound Status: [2:1.2x0.9x0.1] [3:0.7x0.5x0.1] [N/A:N/A] Measurements L x W x D (cm) [2:0.848] [3:0.275] [N/A:N/A] A (cm) : rea [2:0.085] [3:0.027] [N/A:N/A] Volume (cm) : [2:88.80%] [3:N/A] [N/A:N/A] % Reduction in Area: [2:88.70%] [3:N/A] [N/A:N/A] % Reduction in Volume: [2:Full Thickness Without Exposed] [3:Full Thickness Without Exposed] [N/A:N/A] Classification: [2:Support Structures Medium] [3:Support Structures Small] [N/A:N/A] Exudate Amount: [2:Serosanguineous] [3:Serosanguineous] [N/A:N/A] Exudate Type: [2:red, brown] [3:red, brown] [N/A:N/A] Exudate Color: [2:Distinct, outline attached] [3:Flat and Intact] [N/A:N/A] Wound Margin: [2:Large (67-100%)] [3:Medium (34-66%)] [N/A:N/A] Granulation Amount: [2:Red] [3:Pink] [N/A:N/A] Granulation Quality: [2:None Present (0%)] [3:Medium (34-66%)] [N/A:N/A] Necrotic Amount: [2:Fat Layer (Subcutaneous Tissue): Yes Fat Layer (Subcutaneous Tissue): Yes N/A] Exposed Structures: [2:Fascia: No Tendon: No Muscle: No Joint: No Bone: No Medium (34-66%)] [3:Fascia: No Tendon: No Muscle: No Joint: No Bone: No None] [N/A:N/A] Epithelialization: [2:Chemical/Enzymatic/Mechanical] [3:N/A] [N/A:N/A] Debridement: [2:N/A] [3:N/A] [N/A:N/A] Instrument: [2:Minimum] [3:N/A] [N/A:N/A] Bleeding: Debridement Treatment Response: Procedure was tolerated well [3:N/A] [N/A:N/A] Post Debridement Measurements L x  1.2x0.9x0.1 [3:N/A] [N/A:N/A] W x D (cm) [2:0.085] [3:N/A] [N/A:N/A] Post Debridement Volume: (cm) [2:Compression Therapy] [3:Compression Therapy] [N/A:N/A] Procedures Performed: [2:Debridement] Treatment Notes Electronic Signature(s) Signed: 06/15/2020 5:29:55 PM By: Linton Ham MD Signed: 06/15/2020 5:51:10 PM By: Deon Pilling Entered By: Linton Ham on 06/15/2020 13:20:52 -------------------------------------------------------------------------------- Multi-Disciplinary Care Plan Details Patient Name: Date of Service: Megan Session D. 06/15/2020 12:30 PM Medical Record Number: [id number] Patient Account Number: [id number] Date of Birth/Sex: Treating RN: 09-Aug-1970 (50 y.o. [name] Primary Care Lehi Phifer: [name] Other Clinician: Referring Tedford Berg: Treating Maleke Feria/Extender: Nyra Market in Treatment: 13 Active Inactive Venous Leg Ulcer Nursing Diagnoses: Knowledge deficit related to disease process and management Potential for venous Insuffiency (use before diagnosis confirmed) Goals: Patient will maintain optimal edema control Date Initiated: 03/10/2020 Target Resolution Date: 06/30/2020 Goal Status: Active Interventions: Assess peripheral edema status every visit. Compression as ordered Provide education on venous insufficiency Treatment Activities: Therapeutic compression applied : 03/10/2020 Notes: Wound/Skin Impairment Nursing Diagnoses: Impaired tissue integrity Knowledge deficit related to ulceration/compromised skin integrity Goals: Patient/caregiver will verbalize understanding of skin care regimen Date Initiated: 03/10/2020 Target Resolution Date: 06/30/2020 Goal Status: Active Ulcer/skin breakdown will have a volume reduction of 30% by week 4 Date Initiated: 03/10/2020 Date Inactivated: 06/15/2020 Target Resolution Date: 06/16/2020 Goal Status: Met Interventions: Assess patient/caregiver ability to  obtain necessary supplies Assess  patient/caregiver ability to perform ulcer/skin care regimen upon admission and as needed Assess ulceration(s) every visit Provide education on ulcer and skin care Treatment Activities: Skin care regimen initiated : 03/10/2020 Topical wound management initiated : 03/10/2020 Notes: Electronic Signature(s) Signed: 06/15/2020 5:51:10 PM By: Deon Pilling Entered By: Deon Pilling on 06/15/2020 13:09:19 -------------------------------------------------------------------------------- Pain Assessment Details Patient Name: Date of Service: MURNA, BACKER D. 06/15/2020 12:30 PM Medical Record Number: [id number] Patient Account Number: [id number] Date of Birth/Sex: Treating RN: 1970-11-04 (50 y.o. [name] Primary Care Devante Capano: [name] Other Clinician: Referring Mellonie Guess: Treating Mitsuko Luera/Extender: Nyra Market in Treatment: 13 Active Problems Location of Pain Severity and Description of Pain Patient Has Paino No Site Locations Pain Management and Medication Current Pain Management: Electronic Signature(s) Signed: 06/16/2020 4:25:56 PM By: Levan Hurst RN, BSN Entered By: Levan Hurst on 06/15/2020 12:50:38 -------------------------------------------------------------------------------- Patient/Caregiver Education Details Patient Name: Date of Service: Megan Session D. 2/17/2022andnbsp12:30 PM Medical Record Number: [id number] Patient Account Number: [id number] Date of Birth/Gender: Treating RN: 1971/02/11 (50 y.o. [name] Primary Care Physician: [name] Other Clinician: Referring Physician: Treating Physician/Extender: Nyra Market in Treatment: 13 Education Assessment Education Provided To: Patient Education Topics Provided Venous: Handouts: Controlling Swelling with Compression Stockings Methods: Explain/Verbal Responses: Reinforcements  needed Electronic Signature(s) Signed: 06/15/2020 5:51:10 PM By: Deon Pilling Entered By: Deon Pilling on 06/15/2020 13:09:38 -------------------------------------------------------------------------------- Wound Assessment Details Patient Name: Date of Service: [name], [name] 06/15/2020 12:30 PM Medical Record Number: [id number] Patient Account Number: [id number] Date of Birth/Sex: Treating RN: 1971/04/15 (50 y.o. [name] Primary Care Caulin Begley: [name] Other Clinician: Referring Geoffrey Hynes: Treating Markeia Harkless/Extender: Nyra Market in Treatment: 13 Wound Status Wound Number: 2 Primary Trauma, Other Etiology: Wound Location: Left, Medial Lower Leg Wound Open Wounding Event: Trauma Status: Date Acquired: 05/11/2020 Comorbid Sleep Apnea, Congestive Heart Failure, Coronary Artery Weeks Of Treatment: 3 History: Disease, Hypertension Clustered Wound: No Wound Measurements Length: (cm) 1.2 Width: (cm) 0.9 Depth: (cm) 0.1 Area: (cm) 0.848 Volume: (cm) 0.085 % Reduction in Area: 88.8% % Reduction in Volume: 88.7% Epithelialization: Medium (34-66%) Tunneling: No Undermining: No Wound Description Classification: Full Thickness Without Exposed Support Structures Wound Margin: Distinct, outline attached Exudate Amount: Medium Exudate Type: Serosanguineous Exudate Color: red, brown Foul Odor After Cleansing: No Slough/Fibrino No Wound Bed Granulation Amount: Large (67-100%) Exposed Structure Granulation Quality: Red Fascia Exposed: No Necrotic Amount: None Present (0%) Fat Layer (Subcutaneous Tissue) Exposed: Yes Tendon Exposed: No Muscle Exposed: No Joint Exposed: No Bone Exposed: No Treatment Notes Wound #2 (Lower Leg) Wound Laterality: Left, Medial Cleanser Peri-Wound Care Sween Lotion (Moisturizing lotion) Discharge Instruction: Apply moisturizing lotion as directed Topical Primary Dressing Hydrofera Blue  Classic Foam, 2x2 in Discharge Instruction: Moisten with saline prior to applying to wound bed Secondary Dressing Woven Gauze Sponge, Non-Sterile 4x4 in Discharge Instruction: Apply over primary dressing as directed. ABD Pad, 5x9 Discharge Instruction: Apply over primary dressing as directed. Secured With Compression Wrap FourPress (4 layer compression wrap) Discharge Instruction: Apply four layer compression as directed. Unna boot first layer Discharge Instruction: Apply unna boot first layer at upper portion of lower leg. Compression Stockings Mediven Dual Layer Compression System Quantity: 1 Left Leg Compression Amount: 30-40 mmHg Discharge Instruction: Apply compression stocking daily as instructed. Apply first thing in the morning, remove at night before bed. Add-Ons Electronic Signature(s) Signed: 06/16/2020 4:25:56 PM By: Levan Hurst RN, BSN Entered By: Levan Hurst on 06/15/2020 12:56:40 --------------------------------------------------------------------------------  Wound Assessment Details Patient Name: Date of Service: [name], [name] 06/15/2020 12:30 PM Medical Record Number: [id number] Patient Account Number: [id number] Date of Birth/Sex: Treating RN: December 13, 1970 (50 y.o. [name] Primary Care Jahel Wavra: [name] Other Clinician: Referring Mirren Gest: Treating Bronsen Serano/Extender: Nyra Market in Treatment: 13 Wound Status Wound Number: 3 Primary Venous Leg Ulcer Etiology: Wound Location: Left, Anterior Lower Leg Wound Open Wounding Event: Gradually Appeared Status: Date Acquired: 06/15/2020 Comorbid Sleep Apnea, Congestive Heart Failure, Coronary Artery Weeks Of Treatment: 0 History: Disease, Hypertension Clustered Wound: No Wound Measurements Length: (cm) 0.7 Width: (cm) 0.5 Depth: (cm) 0.1 Area: (cm) 0.275 Volume: (cm) 0.027 % Reduction in Area: % Reduction in Volume: Epithelialization:  None Tunneling: No Undermining: No Wound Description Classification: Full Thickness Without Exposed Support Structu Wound Margin: Flat and Intact Exudate Amount: Small Exudate Type: Serosanguineous Exudate Color: red, brown res Foul Odor After Cleansing: No Slough/Fibrino Yes Wound Bed Granulation Amount: Medium (34-66%) Exposed Structure Granulation Quality: Pink Fascia Exposed: No Necrotic Amount: Medium (34-66%) Fat Layer (Subcutaneous Tissue) Exposed: Yes Tendon Exposed: No Muscle Exposed: No Joint Exposed: No Bone Exposed: No Treatment Notes Wound #3 (Lower Leg) Wound Laterality: Left, Anterior Cleanser Peri-Wound Care Sween Lotion (Moisturizing lotion) Discharge Instruction: Apply moisturizing lotion as directed Topical Primary Dressing Hydrofera Blue Classic Foam, 2x2 in Discharge Instruction: Moisten with saline prior to applying to wound bed Secondary Dressing Woven Gauze Sponge, Non-Sterile 4x4 in Discharge Instruction: Apply over primary dressing as directed. ABD Pad, 5x9 Discharge Instruction: Apply over primary dressing as directed. Secured With Compression Wrap FourPress (4 layer compression wrap) Discharge Instruction: Apply four layer compression as directed. Unna boot first layer Discharge Instruction: Apply unna boot first layer at upper portion of lower leg. Compression Stockings Add-Ons Electronic Signature(s) Signed: 06/16/2020 4:25:56 PM By: Levan Hurst RN, BSN Entered By: Levan Hurst on 06/15/2020 12:57:28 -------------------------------------------------------------------------------- [place] Details Patient Name: Date of Service: [name], [name] 06/15/2020 12:30 PM Medical Record Number: [id number] Patient Account Number: [id number] Date of Birth/Sex: Treating RN: Aug 19, 1970 (50 y.o. [name] Primary Care Melbert Botelho: [name] Other Clinician: Referring Metzli Pollick: Treating Marvelous Woolford/Extender: Nyra Market in Treatment: 13 Vital Signs Time Taken: 12:50 Temperature (F): 97.9 Height (in): 66 Pulse (bpm): 83 Weight (lbs): 327 Respiratory Rate (breaths/min): 18 Body Mass Index (BMI): 52.8 Blood Pressure (mmHg): 132/76 Reference Range: 80 - 120 mg / dl Electronic Signature(s) Signed: 06/16/2020 4:25:56 PM By: Levan Hurst RN, BSN Entered By: Levan Hurst on 06/15/2020 12:50:32

## 2020-06-16 NOTE — Patient Instructions (Addendum)
Please go to the radiology department at [facility] to get the x-ray done of your back.  You do not need an appointment.  Tell them that your doctor sent you to get an x-ray of your back and they will follow-up my orders.  I have prescribed a muscle relaxant called Robaxin for you to use as needed for the back.  I have also prescribed a pain patch called Lidoderm patch to use daily.  Td (Tetanus, Diphtheria) Vaccine: What You Need to Know 1. Why get vaccinated? Td vaccine can prevent tetanus and diphtheria. Tetanus enters the body through cuts or wounds. Diphtheria spreads from person to person.  TETANUS (T) causes painful stiffening of the muscles. Tetanus can lead to serious health problems, including being unable to open the mouth, having trouble swallowing and breathing, or death.  DIPHTHERIA (D) can lead to difficulty breathing, heart failure, paralysis, or death. 2. Td vaccine Td is only for children 7 years and older, adolescents, and adults.  Td is usually given as a booster dose every 10 years, or after 5 years in the case of a severe or dirty wound or burn. Another vaccine, called "Tdap," may be used instead of Td. Tdap protects against pertussis, also known as "whooping cough," in addition to tetanus and diphtheria. Td may be given at the same time as other vaccines. 3. Talk with your health care provider Tell your vaccination provider if the person getting the vaccine:  Has had an allergic reaction after a previous dose of any vaccine that protects against tetanus or diphtheria, or has any severe, life-threatening allergies  Has ever had Guillain-Barr Syndrome (also called "GBS")  Has had severe pain or swelling after a previous dose of any vaccine that protects against tetanus or diphtheria In some cases, your health care provider may decide to postpone Td vaccination until a future visit. People with minor illnesses, such as a cold, may be vaccinated. People who are  moderately or severely ill should usually wait until they recover before getting Td vaccine.  Your health care provider can give you more information. 4. Risks of a vaccine reaction  Pain, redness, or swelling where the shot was given, mild fever, headache, feeling tired, and nausea, vomiting, diarrhea, or stomachache sometimes happen after Td vaccination. People sometimes faint after medical procedures, including vaccination. Tell your provider if you feel dizzy or have vision changes or ringing in the ears.  As with any medicine, there is a very remote chance of a vaccine causing a severe allergic reaction, other serious injury, or death. 5. What if there is a serious problem? An allergic reaction could occur after the vaccinated person leaves the clinic. If you see signs of a severe allergic reaction (hives, swelling of the face and throat, difficulty breathing, a fast heartbeat, dizziness, or weakness), call 9-1-1 and get the person to the nearest hospital.  For other signs that concern you, call your health care provider.  Adverse reactions should be reported to the Vaccine Adverse Event Reporting System (VAERS). Your health care provider will usually file this report, or you can do it yourself. Visit the VAERS website at www.vaers.SamedayNews.es or call (424) 247-2139. VAERS is only for reporting reactions, and VAERS staff members do not give medical advice. 6. The National Vaccine Injury Compensation Program The [employer] Vaccine Injury Compensation Program (VICP) is a federal program that was created to compensate people who may have been injured by certain vaccines. Claims regarding alleged injury or death due to  vaccination have a time limit for filing, which may be as short as two years. Visit the VICP website at GoldCloset.com.ee or call 317 073 2228 to learn about the program and about filing a claim. 7. How can I learn more?  Ask your health care provider.  Call your local or  state health department.  Visit the website of the Food and Drug Administration (FDA) for vaccine package inserts and additional information at TraderRating.uy.  Contact the Centers for Disease Control and Prevention (CDC): ? Call 914-229-2134 (1-800-CDC-INFO) or ? Visit CDC's website at http://hunter.com/. Vaccine Information Statement Td (Tetanus, Diphtheria) Vaccine (12/03/2019) This information is not intended to replace advice given to you by your health care provider. Make sure you discuss any questions you have with your health care provider. Document Revised: 01/20/2020 Document Reviewed: 01/20/2020 Elsevier Patient Education  [place].    Influenza Virus Vaccine injection (Fluarix) What is this medicine? INFLUENZA VIRUS VACCINE (in floo EN zuh VAHY ruhs vak SEEN) helps to reduce the risk of getting influenza also known as the flu. This medicine may be used for other purposes; ask your health care provider or pharmacist if you have questions. COMMON BRAND NAME(S): Fluarix, Fluzone What should I tell my health care provider before I take this medicine? They need to know if you have any of these conditions:  bleeding disorder like hemophilia  fever or infection  Guillain-Barre syndrome or other neurological problems  immune system problems  infection with the human immunodeficiency virus (HIV) or AIDS  low blood platelet counts  multiple sclerosis  an unusual or allergic reaction to influenza virus vaccine, eggs, chicken proteins, latex, gentamicin, other medicines, foods, dyes or preservatives  pregnant or trying to get pregnant  breast-feeding How should I use this medicine? This vaccine is for injection into a muscle. It is given by a health care professional. A copy of Vaccine Information Statements will be given before each vaccination. Read this sheet carefully each time. The sheet may change frequently. Talk to your  pediatrician regarding the use of this medicine in children. Special care may be needed. Overdosage: If you think you have taken too much of this medicine contact a poison control center or emergency room at once. NOTE: This medicine is only for you. Do not share this medicine with others. What if I miss a dose? This does not apply. What may interact with this medicine?  chemotherapy or radiation therapy  medicines that lower your immune system like etanercept, anakinra, infliximab, and adalimumab  medicines that treat or prevent blood clots like warfarin  phenytoin  steroid medicines like prednisone or cortisone  theophylline  vaccines This list may not describe all possible interactions. Give your health care provider a list of all the medicines, herbs, non-prescription drugs, or dietary supplements you use. Also tell them if you smoke, drink alcohol, or use illegal drugs. Some items may interact with your medicine. What should I watch for while using this medicine? Report any side effects that do not go away within 3 days to your doctor or health care professional. Call your health care provider if any unusual symptoms occur within 6 weeks of receiving this vaccine. You may still catch the flu, but the illness is not usually as bad. You cannot get the flu from the vaccine. The vaccine will not protect against colds or other illnesses that may cause fever. The vaccine is needed every year. What side effects may I notice from receiving this medicine? Side effects that  you should report to your doctor or health care professional as soon as possible:  allergic reactions like skin rash, itching or hives, swelling of the face, lips, or tongue Side effects that usually do not require medical attention (report to your doctor or health care professional if they continue or are bothersome):  fever  headache  muscle aches and pains  pain, tenderness, redness, or swelling at site where  injected  weak or tired This list may not describe all possible side effects. Call your doctor for medical advice about side effects. You may report side effects to FDA at 1-800-FDA-1088. Where should I keep my medicine? This vaccine is only given in a clinic, pharmacy, doctor's office, or other health care setting and will not be stored at home. NOTE: This sheet is a summary. It may not cover all possible information. If you have questions about this medicine, talk to your doctor, pharmacist, or health care provider.  2021 Elsevier/Gold Standard (2007-11-11 09:30:40)

## 2020-06-17 LAB — HIV ANTIBODY (ROUTINE TESTING W REFLEX): HIV Screen 4th Generation wRfx: NONREACTIVE

## 2020-06-18 LAB — URINE CULTURE

## 2020-06-19 ENCOUNTER — Telehealth: Payer: Self-pay | Admitting: General Practice

## 2020-06-19 DIAGNOSIS — N3000 Acute cystitis without hematuria: Secondary | ICD-10-CM

## 2020-06-19 MED ORDER — SULFAMETHOXAZOLE-TRIMETHOPRIM 800-160 MG PO TABS: 1.0000 | ORAL_TABLET | Freq: Two times a day (BID) | ORAL | 0 refills | Status: AC

## 2020-06-19 NOTE — Telephone Encounter (Signed)
Called patient & informed her of results as well as medication sent to pharmacy. Patient verbalized understanding.

## 2020-06-19 NOTE — Telephone Encounter (Signed)
-----   Message from Megan Salon, MD sent at 06/19/2020  6:29 AM EST ----- Regarding: FW: Please let to know her Urine culture showed proteus and she needs treatment with bactrim ds bid x 3 days.  Thank you. ----- Message ----- From: Lavone Neri Lab Results In Sent: 06/18/2020   5:35 PM EST To: Megan Salon, MD

## 2020-06-21 ENCOUNTER — Telehealth: Payer: Self-pay

## 2020-06-21 NOTE — Telephone Encounter (Signed)
Contacted pt and went over Dr. Bertagnolli message pt doesn't have any questions or concerns  

## 2020-06-21 NOTE — Telephone Encounter (Signed)
Lidocaine 5% patches PA was denied.  Patient does not have a history of trial/failure of Lyrica, Gabapentin and Cymbalta.  There are OTC lidocaine patches in the 4% strength if appropriate.

## 2020-06-23 ENCOUNTER — Ambulatory Visit (INDEPENDENT_AMBULATORY_CARE_PROVIDER_SITE_OTHER): Payer: Medicaid Other

## 2020-06-23 ENCOUNTER — Ambulatory Visit (INDEPENDENT_AMBULATORY_CARE_PROVIDER_SITE_OTHER): Payer: Medicaid Other | Admitting: Nurse Practitioner

## 2020-06-23 ENCOUNTER — Other Ambulatory Visit: Payer: Self-pay

## 2020-06-23 ENCOUNTER — Encounter (INDEPENDENT_AMBULATORY_CARE_PROVIDER_SITE_OTHER): Payer: Self-pay | Admitting: Nurse Practitioner

## 2020-06-23 VITALS — BP 150/80 | HR 98 | Resp 16 | Wt 332.0 lb

## 2020-06-23 DIAGNOSIS — L97221 Non-pressure chronic ulcer of left calf limited to breakdown of skin: Secondary | ICD-10-CM

## 2020-06-23 DIAGNOSIS — I10 Essential (primary) hypertension: Secondary | ICD-10-CM | POA: Diagnosis not present

## 2020-06-23 DIAGNOSIS — G8929 Other chronic pain: Secondary | ICD-10-CM

## 2020-06-23 DIAGNOSIS — I890 Lymphedema, not elsewhere classified: Secondary | ICD-10-CM

## 2020-06-23 DIAGNOSIS — M545 Low back pain, unspecified: Secondary | ICD-10-CM

## 2020-06-23 DIAGNOSIS — I82562 Chronic embolism and thrombosis of left calf muscular vein: Secondary | ICD-10-CM | POA: Diagnosis not present

## 2020-06-23 HISTORY — PX: OTHER SURGICAL HISTORY: SHX169

## 2020-06-23 NOTE — Progress Notes (Signed)
Subjective:    Patient ID: [name], female    DOB: 1970-08-11, 51 y.o.   MRN: [id number] Chief Complaint  Patient presents with  . Follow-up    Ultrasound follow up    [name] is a 50 y.o. female.  Patient returns today for noninvasive studies in regards to chronic leg swelling and discoloration.  The patient has had recurrent ulceration on her left lower extremity in addition to lymphedema.  The patient has a lymphedema pump however it has been broken for several years.  The patient has a previous history of multiple DVTs bilaterally with an IVC filter in place.  This is been there for approximately 5 years ago.  The patient is also on lifetime anticoagulation.  The patient has swelling bilaterally.  The right is a little bit worse but the left has recurrent ulcerations.  The patient utilizes compression socks and other conservative tactics including elevation.  The patient also complains about lower back pain and difficulty with walking as well.  The patient underwent a bilateral lower extremity arterial duplex today.  No evidence of DVT or superficial thrombophlebitis seen bilaterally.  No evidence of deep venous insufficiency seen bilaterally.  No evidence of superficial venous reflux in her left lower extremity.  Small area of superficial venous reflux in the right great saphenous vein at the proximal calf.   Review of Systems  Cardiovascular: Positive for leg swelling.  All other systems reviewed and are negative.      Objective:   Physical Exam Vitals reviewed.  Constitutional:      Appearance: She is obese.  HENT:     Head: Normocephalic.  Cardiovascular:     Rate and Rhythm: Normal rate.  Pulmonary:     Effort: Pulmonary effort is normal.  Musculoskeletal:     Right lower leg: 2+ Edema present.     Left lower leg: 2+ Edema present.  Neurological:     Mental Status: She is alert and oriented to person, place, and time.  Psychiatric:         Mood and Affect: Mood normal.        Behavior: Behavior normal.        Thought Content: Thought content normal.        Judgment: Judgment normal.     BP (!) 150/80 (BP Location: Right Arm)   Pulse 98   Resp 16   Wt (!) 332 lb (150.6 kg)   BMI 53.59 kg/m   Past Medical History:  Diagnosis Date  . CAD (coronary artery disease)    PT STATES - HAD A CARDIAC CATH - NOT TOLD SHE HAD CAD -> week note from [place] indicates history of MI (patient cannot corroborate  . Cellulitis   . CHF (congestive heart failure) ([place])   . DVT (deep venous thrombosis) ([place]) 09/17/2017   Recurrent DVT November, 2020-recommendation was lifelong DOAC  . Generalized anxiety disorder   . H/O gastric ulcer 11/16/2018  . History of small bowel obstruction    In childhood  . Hypertension   . Iron deficiency anemia due to chronic blood loss    Previously been followed by hematology for iron infusion every 2 weeks and as of 2019; full GI evaluation including capsule endoscopy negative.  . Morbid obesity due to excess calories ([place])   . Osteoarthritis of left knee   . Prediabetes   . Small bowel obstruction ([place])    as a child  . Speech impediment  Stutter / stammer    Social History   Socioeconomic History  . Marital status: Single    Spouse name: Not on file  . Number of children: 0  . Years of education: Not on file  . Highest education level: 12th grade  Occupational History  . Occupation: unemployed on [employer] Use  . Smoking status: Former Research scientist (life sciences)  . Smokeless tobacco: Never Used  Vaping Use  . Vaping Use: Never used  Substance and Sexual Activity  . Alcohol use: Not Currently  . Drug use: Not Currently  . Sexual activity: Not Currently  Other Topics Concern  . Not on file  Social History Narrative  . Not on file   Social Determinants of Health   Financial Resource Strain: Not on file  Food Insecurity: No Food Insecurity  . Worried About Charity fundraiser in the Last  Year: Never true  . Ran Out of Food in the Last Year: Never true  Transportation Needs: No Transportation Needs  . Lack of Transportation (Medical): No  . Lack of Transportation (Non-Medical): No  Physical Activity: Not on file  Stress: Not on file  Social Connections: Not on file  Intimate Partner Violence: Not on file    Past Surgical History:  Procedure Laterality Date  . ABDOMINAL WALL DEFECT REPAIR  1970  . IVC FILTER INSERTION  2017  . OOPHORECTOMY  1996  . OOPHORECTOMY  1997  . PORTACATH PLACEMENT  2014    Family History  Problem Relation Age of Onset  . Diabetes Mellitus II Mother   . COPD Father   . Diabetes Father   . Diabetes Mellitus II Maternal Grandmother   . Breast cancer Paternal Grandfather     Allergies  Allergen Reactions  . Ace Inhibitors Rash and Other (See Comments)    Make pt bleed  . Aspirin Other (See Comments)    Per patient paperwork: blood clot?  Likely because of chronic DOAC  . Hydromorphone Hives and Itching  . Vancomycin Itching and Rash  . Contrast Media [Iodinated Diagnostic Agents] Hives  . Dilaudid [Hydromorphone Hcl] Hives    CBC Latest Ref Rng & Units 05/11/2020 05/04/2020 02/08/2020  WBC 4.0 - 10.5 K/uL 6.3 5.1 5.0  Hemoglobin 12.0 - 15.0 g/dL 10.1(L) 9.8(L) 10.1(L)  Hematocrit 36.0 - 46.0 % 34.5(L) 34.0(L) 35.3(L)  Platelets 150 - 400 K/uL 165 132(L) 148(L)      CMP     Component Value Date/Time   NA 139 05/11/2020 1738   K 3.9 05/11/2020 1738   CL 100 05/11/2020 1738   CO2 30 05/11/2020 1738   GLUCOSE 92 05/11/2020 1738   BUN 9 05/11/2020 1738   CREATININE 0.92 05/11/2020 1738   CREATININE 0.76 05/04/2020 1517   CALCIUM 9.2 05/11/2020 1738   PROT 7.7 05/11/2020 1738   ALBUMIN 3.4 (L) 05/11/2020 1738   AST 14 (L) 05/11/2020 1738   AST 11 (L) 05/04/2020 1517   ALT 10 05/11/2020 1738   ALT <6 05/04/2020 1517   ALKPHOS 71 05/11/2020 1738   BILITOT 0.9 05/11/2020 1738   BILITOT 1.0 05/04/2020 1517   GFRNONAA >60  05/11/2020 1738   GFRNONAA >60 05/04/2020 1517   GFRAA >60 01/20/2020 1935     No results found.     Assessment & Plan:   1. Lymphedema Recommend:  No surgery or intervention at this point in time.    I have reviewed my previous discussion with the patient regarding swelling and why it  causes symptoms.  Patient will continue wearing graduated compression stockings class 1 (20-30 mmHg) on a daily basis. The patient will  beginning wearing the stockings first thing in the morning and removing them in the evening. The patient is instructed specifically not to sleep in the stockings.    In addition, behavioral modification including several periods of elevation of the lower extremities during the day will be continued.  This was reviewed with the patient during the initial visit.  The patient will also continue routine exercise, especially walking on a daily basis as was discussed during the initial visit.    Despite conservative treatments of at least four weeks including graduated compression therapy class 1 and behavioral modification including exercise and elevation the patient  has not obtained adequate control of the lymphedema.  The patient still has stage 3 lymphedema and therefore, I believe that a lymph pump should be added to improve the control of the patient's lymphedema.  Additionally, a lymph pump is warranted because it will reduce the risk of cellulitis and ulceration in the future.  Patient should follow-up in six months    2. 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  I discussed with patient that this is not a dangerous occurrence to lead to amputation.  We discussed endovenous laser ablation and that pursuit of endovenous laser ablation would likely not result in any great change in her edema in her right lower extremity.  It would not affect or help  her left lower extremity swelling or ulcerations at all.  In fact, if the patient were to develop another DVT, it could result in worsening swelling if she were to proceed with endovenous laser ablation.  The patient was advised that proceeding with endovenous laser ablation would be met with greater risk than benefit.  3. Lower limb ulcer, calf, left, limited to breakdown of skin ([place]) Had a long discussion with the patient in regards to lower extremity venous ulceration and the causes as well as prevention measures.  The patient has had lymphedema due to multiple recurrent DVTs bilaterally and due to the thinning skin over the area, she is more at risk for ulceration.  Unfortunately, there are no medications or surgeries to prevent ulcerations from occurring.  The best prevention for further ulcerations includes the conservative therapy discussed including medical grade 1 compression, elevation and exercise.  4. Resistant hypertension Continue antihypertensive medications as already ordered, these medications have been reviewed and there are no changes at this time.   5. Chronic bilateral low back pain, unspecified whether sciatica present Discussed with patient that typically issues with veins do not cause difficulty with walking.  Endovenous laser ablation would not improve her ability to walk and that includes her lower back pain.  The patient has never had her back formally evaluated.  We will place a referral to neurosurgery for further work-up. - Ambulatory referral to Neurosurgery   Current Outpatient Medications on File Prior to Visit  Medication Sig Dispense Refill  . acetaminophen (TYLENOL) 500 MG tablet Take 1 tablet (500 mg total) by mouth every 6 (six) hours as needed. 60 tablet 0  . amLODipine (NORVASC) 5 MG tablet Take 0.5 tablets (2.5 mg total) by mouth daily. 15 tablet 6  . apixaban (ELIQUIS) 5 MG TABS tablet Take 1 tablet (5 mg total) by mouth 2 (two) times daily. 60 tablet 5   .  atorvastatin (LIPITOR) 40 MG tablet Take 1 tablet (40 mg total) by mouth daily. 30 tablet 6  . busPIRone (BUSPAR) 15 MG tablet Take 15 mg by mouth daily.    . carvedilol (COREG) 25 MG tablet Take 1 tablet (25 mg total) by mouth 2 (two) times daily with a meal. 60 tablet 6  . cholecalciferol (VITAMIN D3) 25 MCG (1000 UNIT) tablet Take 1 tablet (1,000 Units total) by mouth daily. 30 tablet 5  . fluticasone (FLOVENT HFA) 110 MCG/ACT inhaler Inhale 2 puffs into the lungs daily.    . furosemide (LASIX) 80 MG tablet Take 1 tablet (80 mg total) by mouth daily. 30 tablet 3  . lidocaine (LIDODERM) 5 % Place 1 patch onto the skin daily. Remove & Discard patch within 12 hours or as directed by MD 30 patch 0  . methocarbamol (ROBAXIN) 500 MG tablet Take 1 tablet (500 mg total) by mouth daily as needed for muscle spasms. 30 tablet 1  . omeprazole (PRILOSEC) 20 MG capsule Take 20 mg by mouth daily.    [name] PARoxetine (PAXIL) 10 MG tablet Take 1 tablet (10 mg total) by mouth daily. 30 tablet 6  . potassium chloride SA (KLOR-CON) 20 MEQ tablet Take 1 tablet (20 mEq total) by mouth daily. 30 tablet 6  . PROAIR HFA 108 (90 Base) MCG/ACT inhaler INHALE TWO PUFFS BY MOUTH EVERY 6 HOURS AS NEEDED FOR WHEEZING OR FOR SHORTNESS OF BREATH 8.5 g 0  . spironolactone (ALDACTONE) 25 MG tablet Take 1 tablet (25 mg total) by mouth daily. 30 tablet 6  . valsartan (DIOVAN) 80 MG tablet Take 1 tablet (80 mg total) by mouth daily. 90 tablet 3  . vitamin B-12 (CYANOCOBALAMIN) 1000 MCG tablet Take 1,000 mcg by mouth daily.     No current facility-administered medications on file prior to visit.    There are no Patient Instructions on file for this visit. No follow-ups on file.   Kris Hartmann, NP

## 2020-06-24 ENCOUNTER — Encounter (INDEPENDENT_AMBULATORY_CARE_PROVIDER_SITE_OTHER): Payer: Self-pay | Admitting: Nurse Practitioner

## 2020-06-26 ENCOUNTER — Encounter (HOSPITAL_BASED_OUTPATIENT_CLINIC_OR_DEPARTMENT_OTHER): Payer: Medicaid Other | Admitting: Internal Medicine

## 2020-06-26 ENCOUNTER — Other Ambulatory Visit: Payer: Self-pay

## 2020-06-26 DIAGNOSIS — I87332 Chronic venous hypertension (idiopathic) with ulcer and inflammation of left lower extremity: Secondary | ICD-10-CM | POA: Diagnosis not present

## 2020-06-27 NOTE — Progress Notes (Signed)
MALINA, STILLEY ([id number]) Visit Report for 06/26/2020 Arrival Information Details Patient Name: Date of Service: ADAR, FERDON 06/26/2020 12:30 PM Medical Record Number: [id number] Patient Account Number: [id number] Date of Birth/Sex: Treating RN: Jun 16, 1970 (50 y.o. [name] Primary Care Provider: [name] Other Clinician: Referring Provider: Treating Provider/Extender: Nyra Market in Treatment: 15 Visit Information History Since Last Visit Added or deleted any medications: No Patient Arrived: Ambulatory Any new allergies or adverse reactions: No Arrival Time: 13:03 Had a fall or experienced change in No Accompanied By: self activities of daily living that may affect Transfer Assistance: None risk of falls: Patient Identification Verified: Yes Signs or symptoms of abuse/neglect since last visito No Secondary Verification Process Completed: Yes Hospitalized since last visit: No Patient Requires Transmission-Based Precautions: No Implantable device outside of the clinic excluding No Patient Has Alerts: Yes cellular tissue based products placed in the center Patient Alerts: Patient on Blood Thinner since last visit: Has Dressing in Place as Prescribed: Yes Pain Present Now: No Electronic Signature(s) Signed: 06/27/2020 8:29:17 AM By: Sandre Kitty Entered By: Sandre Kitty on 06/26/2020 13:03:37 -------------------------------------------------------------------------------- Clinic Level of Care Assessment Details Patient Name: Date of Service: JANAKI, SISLER D. 06/26/2020 12:30 PM Medical Record Number: [id number] Patient Account Number: [id number] Date of Birth/Sex: Treating RN: Feb 22, 1971 (50 y.o. [name] Primary Care Provider: [name] Other Clinician: Referring Provider: Treating Provider/Extender: Nyra Market in Treatment: 15 Clinic Level of Care Assessment  Items TOOL 4 Quantity Score '[]'$  - 0 Use when only an EandM is performed on FOLLOW-UP visit ASSESSMENTS - Nursing Assessment / Reassessment X- 1 10 Reassessment of Co-morbidities (includes updates in patient status) X- 1 5 Reassessment of Adherence to Treatment Plan ASSESSMENTS - Wound and Skin A ssessment / Reassessment '[]'$  - 0 Simple Wound Assessment / Reassessment - one wound X- 2 5 Complex Wound Assessment / Reassessment - multiple wounds '[]'$  - 0 Dermatologic / Skin Assessment (not related to wound area) ASSESSMENTS - Focused Assessment X- 1 5 Circumferential Edema Measurements - multi extremities '[]'$  - 0 Nutritional Assessment / Counseling / Intervention X- 1 5 Lower Extremity Assessment (monofilament, tuning fork, pulses) '[]'$  - 0 Peripheral Arterial Disease Assessment (using hand held doppler) ASSESSMENTS - Ostomy and/or Continence Assessment and Care '[]'$  - 0 Incontinence Assessment and Management '[]'$  - 0 Ostomy Care Assessment and Management (repouching, etc.) PROCESS - Coordination of Care X - Simple Patient / Family Education for ongoing care 1 15 '[]'$  - 0 Complex (extensive) Patient / Family Education for ongoing care X- 1 10 Staff obtains Programmer, systems, Records, T Results / Process Orders est '[]'$  - 0 Staff telephones HHA, Nursing Homes / Clarify orders / etc '[]'$  - 0 Routine Transfer to another Facility (non-emergent condition) '[]'$  - 0 Routine Hospital Admission (non-emergent condition) '[]'$  - 0 New Admissions / Biomedical engineer / Ordering NPWT Apligraf, etc. , '[]'$  - 0 Emergency Hospital Admission (emergent condition) X- 1 10 Simple Discharge Coordination '[]'$  - 0 Complex (extensive) Discharge Coordination PROCESS - Special Needs '[]'$  - 0 Pediatric / Minor Patient Management '[]'$  - 0 Isolation Patient Management '[]'$  - 0 Hearing / Language / Visual special needs '[]'$  - 0 Assessment of Community assistance (transportation, D/C planning, etc.) '[]'$  - 0 Additional  assistance / Altered mentation '[]'$  - 0 Support Surface(s) Assessment (bed, cushion, seat, etc.) INTERVENTIONS - Wound Cleansing / Measurement '[]'$  - 0 Simple Wound Cleansing - one wound X- 2 5 Complex Wound  Cleansing - multiple wounds X- 1 5 Wound Imaging (photographs - any number of wounds) '[]'$  - 0 Wound Tracing (instead of photographs) '[]'$  - 0 Simple Wound Measurement - one wound '[]'$  - 0 Complex Wound Measurement - multiple wounds INTERVENTIONS - Wound Dressings '[]'$  - 0 Small Wound Dressing one or multiple wounds '[]'$  - 0 Medium Wound Dressing one or multiple wounds '[]'$  - 0 Large Wound Dressing one or multiple wounds '[]'$  - 0 Application of Medications - topical '[]'$  - 0 Application of Medications - injection INTERVENTIONS - Miscellaneous '[]'$  - 0 External ear exam '[]'$  - 0 Specimen Collection (cultures, biopsies, blood, body fluids, etc.) '[]'$  - 0 Specimen(s) / Culture(s) sent or taken to Lab for analysis '[]'$  - 0 Patient Transfer (multiple staff / Civil Service fast streamer / Similar devices) '[]'$  - 0 Simple Staple / Suture removal (25 or less) '[]'$  - 0 Complex Staple / Suture removal (26 or more) '[]'$  - 0 Hypo / Hyperglycemic Management (close monitor of Blood Glucose) '[]'$  - 0 Ankle / Brachial Index (ABI) - do not check if billed separately X- 1 5 Vital Signs Has the patient been seen at the hospital within the last three years: Yes Total Score: 90 Level Of Care: New/Established - Level 3 Electronic Signature(s) Signed: 06/27/2020 5:57:00 PM By: Baruch Gouty RN, BSN Entered By: Baruch Gouty on 06/26/2020 13:33:27 -------------------------------------------------------------------------------- Lower Extremity Assessment Details Patient Name: Date of Service: [name] 06/26/2020 12:30 PM Medical Record Number: [id number] Patient Account Number: [id number] Date of Birth/Sex: Treating RN: 1971-02-04 (50 y.o. [name] Primary Care Provider: [name] Other  Clinician: Referring Provider: Treating Provider/Extender: Nyra Market in Treatment: 15 Edema Assessment Assessed: [name]: No] [name]: No] Edema: [Left: Ye] [Right: s] Calf Left: Right: Point of Measurement: 30 cm From Medial Instep 45.5 cm Ankle Left: Right: Point of Measurement: 11 cm From Medial Instep 24.2 cm Vascular Assessment Pulses: Dorsalis Pedis Palpable: [Left:Yes] Electronic Signature(s) Signed: 06/27/2020 5:57:00 PM By: Baruch Gouty RN, BSN Entered By: Baruch Gouty on 06/26/2020 13:28:12 -------------------------------------------------------------------------------- Multi Wound Chart Details Patient Name: Date of Service: [name], Keshawna D. 06/26/2020 12:30 PM Medical Record Number: [id number] Patient Account Number: [id number] Date of Birth/Sex: Treating RN: 1971/01/31 (50 y.o. [name] Primary Care Provider: [name] Other Clinician: Referring Provider: Treating Provider/Extender: Nyra Market in Treatment: 15 Vital Signs Height(in): 19 Pulse(bpm): 58 Weight(lbs): 327 Blood Pressure(mmHg): 130/78 Body Mass Index(BMI): 53 Temperature(F): 97.8 Respiratory Rate(breaths/min): 18 Photos: [2:No Photos Left, Medial Lower Leg] [3:No Photos Left, Anterior Lower Leg] [N/A:N/A N/A] Wound Location: [2:Trauma] [3:Gradually Appeared] [N/A:N/A] Wounding Event: [2:Trauma, Other] [3:Venous Leg Ulcer] [N/A:N/A] Primary Etiology: [2:Sleep Apnea, Congestive Heart] [3:Sleep Apnea, Congestive Heart] [N/A:N/A] Comorbid History: [2:Failure, Coronary Artery Disease, Hypertension 05/11/2020] [3:Failure, Coronary Artery Disease, Hypertension 06/15/2020] [N/A:N/A] Date Acquired: [2:5] [3:1] [N/A:N/A] Weeks of Treatment: [2:Open] [3:Open] [N/A:N/A] Wound Status: [2:0x0x0] [3:0x0x0] [N/A:N/A] Measurements L x W x D (cm) [2:0] [3:0] [N/A:N/A] A (cm) : rea [2:0] [3:0] [N/A:N/A] Volume (cm) : [2:100.00%]  [3:100.00%] [N/A:N/A] % Reduction in Area: [2:100.00%] [3:100.00%] [N/A:N/A] % Reduction in Volume: [2:Full Thickness Without Exposed] [3:Full Thickness Without Exposed] [N/A:N/A] Classification: [2:Support Structures None Present] [3:Support Structures None Present] [N/A:N/A] Exudate Amount: [2:Distinct, outline attached] [3:Flat and Intact] [N/A:N/A] Wound Margin: [2:None Present (0%)] [3:None Present (0%)] [N/A:N/A] Granulation Amount: [2:None Present (0%)] [3:None Present (0%)] [N/A:N/A] Necrotic Amount: [2:Fascia: No] [3:Fascia: No] [N/A:N/A] Exposed Structures: [2:Fat Layer (Subcutaneous Tissue): No Tendon: No Muscle: No Joint:  No Bone: No Large (67-100%)] [3:Fat Layer (Subcutaneous Tissue): No Tendon: No Muscle: No Joint: No Bone: No Large (67-100%)] [N/A:N/A] Treatment Notes Electronic Signature(s) Signed: 06/26/2020 5:56:44 PM By: Levan Hurst RN, BSN Signed: 06/26/2020 6:21:54 PM By: Linton Ham MD Entered By: Linton Ham on 06/26/2020 13:45:07 -------------------------------------------------------------------------------- Multi-Disciplinary Care Plan Details Patient Name: Date of Service: [name] 06/26/2020 12:30 PM Medical Record Number: [id number] Patient Account Number: [id number] Date of Birth/Sex: Treating RN: 1971/01/29 (50 y.o. [name] Primary Care Provider: [name] Other Clinician: Referring Provider: Treating Provider/Extender: Nyra Market in Treatment: [place] reviewed with physician Active Inactive Electronic Signature(s) Signed: 06/27/2020 5:57:00 PM By: Baruch Gouty RN, BSN Entered By: Baruch Gouty on 06/26/2020 13:32:16 -------------------------------------------------------------------------------- Pain Assessment Details Patient Name: Date of Service: [name] 06/26/2020 12:30 PM Medical Record Number: [id number] Patient Account Number:  [id number] Date of Birth/Sex: Treating RN: November 27, 1970 (50 y.o. [name] Primary Care Provider: [name] Other Clinician: Referring Provider: Treating Provider/Extender: Nyra Market in Treatment: 15 Active Problems Location of Pain Severity and Description of Pain Patient Has Paino No Site Locations Pain Management and Medication Current Pain Management: Electronic Signature(s) Signed: 06/26/2020 5:56:44 PM By: Levan Hurst RN, BSN Signed: 06/27/2020 8:29:17 AM By: Sandre Kitty Entered By: Sandre Kitty on 06/26/2020 13:03:56 -------------------------------------------------------------------------------- Patient/Caregiver Education Details Patient Name: Date of Service: [name] 2/28/2022andnbsp12:30 PM Medical Record Number: [id number] Patient Account Number: [id number] Date of Birth/Gender: Treating RN: [birth year] (50 y.o. [name] Primary Care Physician: [name] Other Clinician: Referring Physician: Treating Physician/Extender: Nyra Market in Treatment: 15 Education Assessment Education Provided To: Patient Education Topics Provided Venous: Methods: Explain/Verbal Responses: Reinforcements needed, State content correctly Wound/Skin Impairment: Methods: Explain/Verbal Responses: Reinforcements needed, State content correctly Electronic Signature(s) Signed: 06/27/2020 5:57:00 PM By: Baruch Gouty RN, BSN Entered By: Baruch Gouty on 06/26/2020 13:32:47 -------------------------------------------------------------------------------- Wound Assessment Details Patient Name: Date of Service: [name] 06/26/2020 12:30 PM Medical Record Number: [id number] Patient Account Number: [id number] Date of Birth/Sex: Treating RN: [birth year] (50 y.o. [name] Primary Care Provider: [name] Other Clinician: Referring Provider: Treating  Provider/Extender: Nyra Market in Treatment: 15 Wound Status Wound Number: 2 Primary Trauma, Other Etiology: Wound Location: Left, Medial Lower Leg Wound Open Wounding Event: Trauma Status: Date Acquired: 05/11/2020 Comorbid Sleep Apnea, Congestive Heart Failure, Coronary Artery Weeks Of Treatment: 5 History: Disease, Hypertension Clustered Wound: No Wound Measurements Length: (cm) Width: (cm) Depth: (cm) Area: (cm) Volume: (cm) 0 % Reduction in Area: 100% 0 % Reduction in Volume: 100% 0 Epithelialization: Large (67-100%) 0 Tunneling: No 0 Undermining: No Wound Description Classification: Full Thickness Without Exposed Support Structures Wound Margin: Distinct, outline attached Exudate Amount: None Present Foul Odor After Cleansing: No Slough/Fibrino No Wound Bed Granulation Amount: None Present (0%) Exposed Structure Necrotic Amount: None Present (0%) Fascia Exposed: No Fat Layer (Subcutaneous Tissue) Exposed: No Tendon Exposed: No Muscle Exposed: No Joint Exposed: No Bone Exposed: No Electronic Signature(s) Signed: 06/27/2020 5:57:00 PM By: Baruch Gouty RN, BSN Entered By: Baruch Gouty on 06/26/2020 13:28:55 -------------------------------------------------------------------------------- Wound Assessment Details Patient Name: Date of Service: [name], Mariaelena D. 06/26/2020 12:30 PM Medical Record Number: [id number] Patient Account Number: [id number] Date of Birth/Sex: Treating RN: 11/09/1970 (50 y.o. [name] Primary Care Provider: [name] Other Clinician: Referring Provider: Treating Provider/Extender: Nyra Market in Treatment: 82  Wound Status Wound Number: 3 Primary Venous Leg Ulcer Etiology: Wound Location: Left, Anterior Lower Leg Wound Open Wounding Event: Gradually Appeared Status: Date Acquired: 06/15/2020 Comorbid Sleep Apnea, Congestive Heart Failure, Coronary  Artery Weeks Of Treatment: 1 History: Disease, Hypertension Clustered Wound: No Wound Measurements Length: (cm) Width: (cm) Depth: (cm) Area: (cm) Volume: (cm) 0 % Reduction in Area: 100% 0 % Reduction in Volume: 100% 0 Epithelialization: Large (67-100%) 0 Tunneling: No 0 Undermining: No Wound Description Classification: Full Thickness Without Exposed Support Structures Wound Margin: Flat and Intact Exudate Amount: None Present Foul Odor After Cleansing: No Slough/Fibrino No Wound Bed Granulation Amount: None Present (0%) Exposed Structure Necrotic Amount: None Present (0%) Fascia Exposed: No Fat Layer (Subcutaneous Tissue) Exposed: No Tendon Exposed: No Muscle Exposed: No Joint Exposed: No Bone Exposed: No Electronic Signature(s) Signed: 06/27/2020 5:57:00 PM By: Baruch Gouty RN, BSN Entered By: Baruch Gouty on 06/26/2020 13:29:19 -------------------------------------------------------------------------------- [place] Details Patient Name: Date of Service: [name], Clela D. 06/26/2020 12:30 PM Medical Record Number: [id number] Patient Account Number: [id number] Date of Birth/Sex: Treating RN: Jan 22, 1971 (50 y.o. [name] Primary Care Provider: [name] Other Clinician: Referring Provider: Treating Provider/Extender: Nyra Market in Treatment: 15 Vital Signs Time Taken: 13:03 Temperature (F): 97.8 Height (in): 66 Pulse (bpm): 90 Weight (lbs): 327 Respiratory Rate (breaths/min): 18 Body Mass Index (BMI): 52.8 Blood Pressure (mmHg): 130/78 Reference Range: 80 - 120 mg / dl Electronic Signature(s) Signed: 06/27/2020 8:29:17 AM By: Sandre Kitty Entered By: Sandre Kitty on 06/26/2020 13:03:51

## 2020-06-27 NOTE — Progress Notes (Signed)
Megan, [name] ([id number]) Visit Report for 06/26/2020 HPI Details Patient Name: Date of Service: [name], [name] 06/26/2020 12:30 PM Medical Record Number: [id number] Patient Account Number: [id number] Date of Birth/Sex: Treating RN: 1970-12-29 (50 y.o. [name] Primary Care Provider: [name] Other Clinician: Referring Provider: Treating Provider/Extender: Nyra Market in Treatment: 15 History of Present Illness HPI Description: ADMISSION 03/10/2020 This is a 50 year old woman who was recently moved to this area from [place] to help look after her mother who is ill. She has a several month history of a wound on the left medial lower leg. She was in the emergency room on 9/23 and 10/12. On both occasions she had x-rays that were negative and she was given doxycycline. On one occasion she had an Haematologist wrap. T the diameter of the wound on 10/12 was 6 cm I do not think a stat large today however her o compression stockings no longer fit because of swelling. Furthermore she tells me she has had external compression pumps but they are not working either. She comes with her last note from her primary doctor in [place] from June. She has a large list of prior diagnoses including iron deficiency anemia, anxiety, asthma, hypertension, coronary artery disease with an MI in 2007, cellulitis in 2019, congestive heart failure, history of a DVT on Eliquis with the advice to stay on this "lifelong", gastric ulcer, chronic blood loss history of an iron infusion given in 2019 with a complete GI work-up negative, osteoarthritis of the left knee, cyst history of a small bowel obstruction, upper GI bleed and prediabetes. She comes in with a long list of medications however the only medications she is taking currently are Eliquis 5 mg twice daily and amlodipine 10 mg daily. She is supposed to be taking atorvastatin, Lasix 80 mg a day, isosorbide  mononitrate 30 mg a day, Klor-Con 20 mEq a day but she does not appear to be taking any of these. She tells me that she has had difficulty getting a primary doctor ABI in our clinic was 1.17 on the left 03/27/2020; patient has a wound on the left anterior lower leg in the setting of chronic lymphedema. The wound margins have been coming in nicely. 12/6; left anterior lower leg wound in the setting of chronic lymphedema. Her wound is contracting. She brought her stockings which are some form of external compression garment I have never really seen these but she said she paid $70 for them in [place] about a year ago 12/13; this is a patient with chronic venous insufficiency and lymphedema. She has a wound on her left medial lower leg that is healed today. She has severe hemosiderin deposition. She has her own 30 mm compression stocking 05/19/2020 This is a patient we discharged a little over a month ago. She has severe chronic venous insufficiency and lymphedema. He had a wound on her left medial lower leg. She had her own 30/40 mm compression stockings. She tells me that she was in the ER a week ago related to iron deficiency anemia. As she was leaving the emergency room she tripped and fell on the curb reopening the wound in the exact same place. She also says her stockings are no longer tight enough and looking at the edema in her leg today that certainly have to agree 2/3; left medial lower leg. The wound is smaller. We have her in 4 layer compression. Venous reflux studies are next week I believe  on 10 February. We will see her back after this for a nurse visit. 2/17; left medial lower leg this is now divided into 2 smaller. As opposed to what I said last time the patient went to see vein and vascular and they aborted the venous reflux studies. In general people think she has stage III lymphedema we agree with that. The issue that I was not aware of is that the patient has a lymphedema pump but  it has not been working. By her description she has not used the pump in more than a year. We discussed stockings with her last week and she is chosen the dual layer compression system Her venous reflux studies are next week on Friday. I do not believe we will be able to fit her into the rechange her dressing so her next appointment will be on Monday she is to wear the new stockings over next weekend 2/28; left medial lower leg in the setting of chronic venous insufficiency and lymphedema. The wound is closed. She saw vein and vascular in [place] on 2/25. They thought she had lymphedema recommended compression. 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 It was felt that she had chronic venous insufficiency and lymphedema due to recurrent DVT s Electronic Signature(s) Signed: 06/26/2020 6:21:54 PM By: Linton Ham MD Entered By: Linton Ham on 06/26/2020 13:46:33 -------------------------------------------------------------------------------- Physical Exam Details Patient Name: Date of Service: [name], [name] 06/26/2020 12:30 PM Medical Record Number: [id number] Patient Account Number: [id number] Date of Birth/Sex: Treating RN: 1970-06-30 (50 y.o. [name] Primary Care Provider: [name] Other Clinician: Referring Provider: Treating Provider/Extender: Nyra Market in Treatment: 15 Constitutional Sitting or standing Blood Pressure is within target range for patient.. Pulse regular and within target range for patient.[name] Respirations regular, non-labored and within target range.. Temperature is normal and within the target range for the patient.[name] Appears in no distress. Notes Wound exam on the left medial lower tibia. Intense hemosiderin deposition. 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 She has lymphedema. Electronic Signature(s) Signed:  06/26/2020 6:21:54 PM By: Linton Ham MD Entered By: Linton Ham on 06/26/2020 13:47:20 -------------------------------------------------------------------------------- Physician Orders Details Patient Name: Date of Service: [name] 06/26/2020 12:30 PM Medical Record Number: [id number] Patient Account Number: [id number] Date of Birth/Sex: Treating RN: 1970-05-19 (50 y.o. [name] Primary Care Provider: [name] Other Clinician: Referring Provider: Treating Provider/Extender: Nyra Market in Treatment: 15 Verbal / Phone Orders: No Diagnosis Coding ICD-10 Coding Code Description [id number] Non-pressure chronic ulcer of other part of left lower leg with fat layer exposed I89.0 Lymphedema, not elsewhere classified I87.332 Chronic venous hypertension (idiopathic) with ulcer and inflammation of left lower extremity Discharge From [facility] Services Discharge from [place] Bathing/ Shower/ Hygiene May shower and wash wound with soap and water. Edema Control - Lymphedema / SCD / Other Bilateral Lower Extremities Elevate legs to the level of the heart or above for 30 minutes daily and/or when sitting, a frequency of: Avoid standing for long periods of time. Patient to wear own compression stockings every day. - right leg daily Exercise regularly Moisturize legs daily. - both legs nightly Compression stocking or Garment 20-30 mm/Hg pressure to: - both legs. Apply first thing in the morning and remove at night. Electronic Signature(s) Signed: 06/26/2020 6:21:54 PM By: Linton Ham MD Signed:  06/27/2020 5:57:00 PM By: Baruch Gouty RN, BSN Entered By: Baruch Gouty on 06/26/2020 13:35:40 -------------------------------------------------------------------------------- Problem List Details Patient Name: Date of Service: [name] 06/26/2020 12:30 PM Medical Record Number: [id number] Patient Account Number:  [id number] Date of Birth/Sex: Treating RN: 03-27-1971 (50 y.o. 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 06/26/2020 12:30 PM Medical Record Number: [id number] Patient Account Number: [id number] Date of Birth/Sex: Treating RN: 10/06/1970 (50 y.o. [name] Primary Care Provider: [name] Other Clinician: Referring Provider: Treating Provider/Extender: Nyra Market in Treatment: 15 Subjective History of Present Illness (HPI) ADMISSION 03/10/2020 This is a 50 year old woman who was recently moved to this area from [place] to help look after her mother who is ill. She has a several month history of a wound on the left medial lower leg. She was in the emergency room on 9/23 and 10/12. On both occasions she had x-rays that were negative and she was given doxycycline. On one occasion she had an Haematologist wrap. T the diameter of the wound on 10/12 was 6 cm I do not think a stat large today however her o compression stockings no longer fit because of swelling. Furthermore she tells me she has had external compression pumps but they are not  working either. She comes with her last note from her primary doctor in [place] from June. She has a large list of prior diagnoses including iron deficiency anemia, anxiety, asthma, hypertension, coronary artery disease with an MI in 2007, cellulitis in 2019, congestive heart failure, history of a DVT on Eliquis with the advice to stay on this "lifelong", gastric ulcer, chronic blood loss history of an iron infusion given in 2019 with a complete GI work-up negative, osteoarthritis of the left knee, cyst history of a small bowel obstruction, upper GI bleed and prediabetes. She comes in with a long list of medications however the only medications she is taking currently are Eliquis 5 mg twice daily and amlodipine 10 mg daily. She is supposed to be taking atorvastatin, Lasix 80 mg a day, isosorbide mononitrate 30 mg a day, Klor-Con 20 mEq a day but she does not appear to be taking any of these. She tells me that she has had difficulty getting a primary doctor ABI in our clinic was 1.17 on the left 03/27/2020; patient has a wound on the left anterior lower leg in the setting of chronic lymphedema. The wound margins have been coming in nicely. 12/6; left anterior lower leg wound in the setting of chronic lymphedema. Her wound is contracting. She brought her stockings which are some form of external compression garment I have never really seen these but she said she paid $70 for them in [place] about a year ago 12/13; this is a patient with chronic venous insufficiency and lymphedema. She has a wound on her left medial lower leg that is healed today. She has  severe hemosiderin deposition. She has her own 30 mm compression stocking 05/19/2020 This is a patient we discharged a little over a month ago. She has severe chronic venous insufficiency and lymphedema. He had a wound on her left medial lower leg. She had her own 30/40 mm compression stockings. She tells me that she was in the ER a week ago related  to iron deficiency anemia. As she was leaving the emergency room she tripped and fell on the curb reopening the wound in the exact same place. She also says her stockings are no longer tight enough and looking at the edema in her leg today that certainly have to agree 2/3; left medial lower leg. The wound is smaller. We have her in 4 layer compression. Venous reflux studies are next week I believe on 10 February. We will see her back after this for a nurse visit. 2/17; left medial lower leg this is now divided into 2 smaller. As opposed to what I said last time the patient went to see vein and vascular and they aborted the venous reflux studies. In general people think she has stage III lymphedema we agree with that. The issue that I was not aware of is that the patient has a lymphedema pump but it has not been working. By her description she has not used the pump in more than a year. We discussed stockings with her last week and she is chosen the dual layer compression system Her venous reflux studies are next week on Friday. I do not believe we will be able to fit her into the rechange her dressing so her next appointment will be on Monday she is to wear the new stockings over next weekend 2/28; left medial lower leg in the setting of chronic venous insufficiency and lymphedema. The wound is closed. She saw vein and vascular in [place] on 2/25. They thought she had lymphedema recommended compression. 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 It was felt that she had chronic venous insufficiency and lymphedema due to recurrent DVT s Objective Constitutional Sitting or standing Blood Pressure is within target range for patient.. Pulse regular and within target range for patient.[name] Respirations regular, non-labored and within target range.. Temperature is normal and within the target range for the patient.[name] Appears  in no distress. Vitals Time Taken: 1:03 PM, Height: 66 in, Weight: 327 lbs, BMI: 52.8, Temperature: 97.8 F, Pulse: 90 bpm, Respiratory Rate: 18 breaths/min, Blood Pressure: 130/78 mmHg. General Notes: Wound exam on the left medial lower tibia. Intense hemosiderin deposition. 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 She has lymphedema. Integumentary (Hair, Skin) Wound #2 status is Open. Original cause of wound was Trauma. The date acquired was: 05/11/2020. The wound has been in treatment 5 weeks. The wound is located on the Left,Medial Lower Leg. The wound measures 0cm length x 0cm width x 0cm depth; 0cm^2 area and 0cm^3 volume. There is no tunneling or undermining noted. There is a none present amount of drainage noted. The wound margin is distinct with the outline attached to the wound base. There is no granulation within the wound bed. There is no necrotic tissue within the wound bed. Wound #3 status is Open. Original cause of wound was Gradually Appeared. The date acquired was: 06/15/2020. The wound has been in treatment 1 weeks. The wound is located on the Left,Anterior Lower Leg. The wound  measures 0cm length x 0cm width x 0cm depth; 0cm^2 area and 0cm^3 volume. There is no tunneling or undermining noted. There is a none present amount of drainage noted. The wound margin is flat and intact. There is no granulation within the wound bed. There is no necrotic tissue within the wound bed. Assessment Active Problems ICD-10 Non-pressure chronic ulcer of other part of left lower leg with fat layer exposed Lymphedema, not elsewhere classified Chronic venous hypertension (idiopathic) with ulcer and inflammation of left lower extremity Plan Discharge From [facility] Services: Discharge from [facility] Bathing/ Shower/ Hygiene: May shower and wash wound with soap and water. Edema Control - Lymphedema / SCD / Other: Elevate legs to the level of the heart or above for 30 minutes  daily and/or when sitting, a frequency of: Avoid standing for long periods of time. Patient to wear own compression stockings every day. - right leg daily Exercise regularly Moisturize legs daily. - both legs nightly Compression stocking or Garment 20-30 mm/Hg pressure to: - both legs. Apply first thing in the morning and remove at night. 1. The patient can be transitioned into her own compression stockings. 2. She has been seen by vein and vascular. She has apparently a history of recurrent DVTs 3. She was not felt to benefit from any ablations by vein and vascular Electronic Signature(s) Signed: 06/26/2020 6:21:54 PM By: Linton Ham MD Entered By: Linton Ham on 06/26/2020 13:49:15 -------------------------------------------------------------------------------- SuperBill Details Patient Name: Date of Service: [name] 06/26/2020 Medical Record Number: [id number] Patient Account Number: [id number] Date of Birth/Sex: Treating RN: Oct 08, 1970 (50 y.o. [name] Primary Care Provider: [name] Other Clinician: Referring Provider: Treating Provider/Extender: Nyra Market in Treatment: 15 Diagnosis Coding ICD-10 Codes Code Description [phone] Non-pressure chronic ulcer of other part of left lower leg with fat layer exposed I89.0 Lymphedema, not elsewhere classified I87.332 Chronic venous hypertension (idiopathic) with ulcer and inflammation of left lower extremity Facility Procedures CPT4 Code: [id number] Description: 99213 - WOUND CARE VISIT-LEV 3 EST PT Modifier: Quantity: 1 Physician Procedures : CPT4 Code Description Modifier [id number] - WC PHYS LEVEL 2 - EST PT ICD-10 Diagnosis Description L97.822 Non-pressure chronic ulcer of other part of left lower leg with fat layer exposed I89.0 Lymphedema, not elsewhere classified I87.332 Chronic  venous hypertension (idiopathic) with ulcer and inflammation of left lower  extremity Quantity: 1 Electronic Signature(s) Signed: 06/26/2020 6:21:54 PM By: Linton Ham MD Entered By: Linton Ham on 06/26/2020 13:49:38

## 2020-06-29 NOTE — Progress Notes (Signed)
Primary Care Provider: Ladell Pier, MD Cardiologist: No primary care provider on file. Electrophysiologist: None  Clinic Note: Chief Complaint  Patient presents with  . Follow-up    Blood pressure looks better.  Wound healing better.  Less swelling.   ===================================  ASSESSMENT/PLAN   Problem List Items Addressed This Visit    Iron deficiency anemia due to chronic blood loss    Intermittent transfusions.  Energy level is notably affected by anemia.      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 (HCC) - Primary (Chronic)    Refer to Dr. Lucky Cowboy from [facility].  Recommends foot elevation, compression stockings and wound care.  She does have postphlebitic symptoms as well as lymphedema.  She is on chronic Eliquis for venous stasis and prior DVT.  Continue recommendations per Dr. Lucky Cowboy      Relevant Medications   valsartan (DIOVAN) 80 MG tablet   Venous insufficiency (Chronic)    Seen by Dr. Lucky Cowboy from [facility].  IVC filter in place.  On apixaban. Compression stockings, foot elevation and diuretics.      Relevant Medications   valsartan (DIOVAN) 80 MG tablet   Resistant hypertension (Chronic)    Blood pressure looks much better.  ARB on board.  In the future, would potentially want to see if we can get her off of Norvasc and on the higher dose of ARB, but for now since she is stable we will continue current meds.  Next visit we will titrate valsartan to 160 mg daily and DC Norvasc.      Relevant Medications   valsartan (DIOVAN) 80 MG tablet   (HFpEF) heart failure with preserved ejection fraction (HCC) (Chronic)    I do not think her edema is cardiac in nature.  She does not have orthopnea or PND.  I suspect is probably more related to venous stasis.  More so, lymphedema, based on Doppler findings.  Was referred to Dr. Lazaro Arms, who recommends similar to my  suggestions-compression stockings and foot elevation.  She does take furosemide daily, and I recommended that for worsening days of edema, she can take extra dose.  She is also on spironolactone.  She is on carvedilol at max dose, low-dose amlodipine along with recently added valsartan which we will refill today.      Relevant Medications   valsartan (DIOVAN) 80 MG tablet     ===================================  HPI:    [name] is a 50 y.o. female with a PMH notable for Bilateral Lower Extremity Venous Stasis with Left Lower Extremity Ulcer (on long-term DOAC,s/p IVC Filter-for recurrent DVT-complicated by long-term iron deficiency anemia as well as postphlebitic symptoms.), resistant/difficult to control HTN, HLD, diagnosis of CHF (apparently had an episode of shortness of breath during PRBC infusion August 2021) below who presents today for 33-monthfollow-up..Ether Griffinswas last seen on April 13, 2020 to establish local cardiology care.  [facility]  [facility] [place] Review of Symptoms (Summary): positive for - dyspnea on exertion, edema and orthopnea negative for - chest pain, irregular heartbeat, palpitations, paroxysmal nocturnal dyspnea, rapid heart rate, shortness of breath or Syncope/syncope or TIA/amaurosis fugax, claudication.  Plan was to continue current plans, with exception of adding valsartan 80 mg.  She had just been restarted on spironolactone, Coreg and furosemide along with  amlodipine.  Obtain outside records.  Referred to the [facility] -eventually seen by Dr. Dew.>  Continue Wound Care follow-up  Recent Hospitalizations:   [facility] ER (05/11/2020) -> ER evaluation for fatigue, low hemoglobin and iron.  Was told if fatigue worsens to go to the ER.  No chest pain or dyspnea.  No dizziness or lightheadedness.  No syncope or  near syncope.  Requested blood transfusion/iron infusion.  Labs drawn showed hemoglobin close to baseline of 10 -> discharged to follow-up with hematology  Iron sucrose infusion 05/18/2020  Apparently her referral for vascular surgery was forwarded to [facility]-Dr. Lucky Cowboy. => He agreed with Eliquis and compression stockings as well as leg elevation.  Follow-up with wound care center for left leg wound/breakdown of skin.  Suggested stage III lymphedema (apparently she previously had a lymphedema pump, which is no longer functional.  He recommended venous duplex. => Noted some venous reflux in the left lower extremity calf greater saphenous vein.  Suggested that venous ablation would not likely change the edema, nor what it make any difference to the  right leg.  Graduated compression stockings class I (20-30 mmHg).  Morning to evening.  Recommended frequent episodes of foot elevation.  Routine exercise.  Recommended lymphedema pump  Reviewed  CV studies:    The following studies were reviewed today: (if available, images/films reviewed: From Epic Chart or Care Everywhere) . Only venous Dopplers noted in discussion above   Interval History:   [name] is being seen today here for follow-up.  Indicating that her left leg wound is definitely improving.  Her swelling is improving.  She is trying to wear support stockings, but having a hard time while the wound is still in this process of healing.  She has made a conscious effort to try to cut out sodas and sweets and is trying to get some exercise done now that her leg is feeling better.  She denies any PND orthopnea.  No chest pain or pressure with rest or exertion.  She does have exertional dyspnea, but is somewhat deconditioned.  The more exercise she does, she feels better.  CV Review of Symptoms (Summary): positive for - dyspnea on exertion and Better controlled swelling.  She is elevating her legs, and trying to  use support stockings. negative for - chest pain, irregular heartbeat, orthopnea, palpitations, paroxysmal nocturnal dyspnea, rapid heart rate, shortness of breath or Lightheadedness, dizziness, syncope/near syncope or TIA/amaurosis fugax, claudication.  The patient does not have symptoms concerning for COVID-19 infection (fever, chills, cough, or new shortness of breath).   REVIEWED OF SYSTEMS   Review of Systems  Constitutional: Negative for malaise/fatigue (Energy is improving the more she tries to stay active. ->  Worse when she has anemia.  Better since she had transfusion.) and weight loss.  HENT: Positive for congestion. Negative for nosebleeds.   Respiratory: Negative for shortness of breath (If she overexerts).   Cardiovascular: Positive for leg swelling (Notably improved.  Wound is healing).  Gastrointestinal: Negative for blood in stool and melena.  Genitourinary: Negative for hematuria.  Musculoskeletal: Positive for joint pain (Hips and knees).  Neurological: Positive for weakness (Legs feel weak). Negative for dizziness and headaches.       She has stuttering speech  Endo/Heme/Allergies: Positive for environmental allergies.  Psychiatric/Behavioral: Positive for memory loss. The patient is nervous/anxious.    I have reviewed and (if needed) personally updated the patient's problem list, medications, allergies, past medical and surgical  history, social and family history.   PAST MEDICAL HISTORY   Past Medical History:  Diagnosis Date  . CAD (coronary artery disease)    PT STATES - HAD A CARDIAC CATH - NOT TOLD SHE HAD CAD -> week note from [place] indicates history of MI (patient cannot corroborate  . Cellulitis   . CHF (congestive heart failure) ([place])   . DVT (deep venous thrombosis) ([place]) 09/17/2017   Recurrent DVT November, 2020-recommendation was lifelong DOAC  . Generalized anxiety disorder   . H/O gastric ulcer 11/16/2018  . History of small bowel obstruction     In childhood  . Hypertension   . Iron deficiency anemia due to chronic blood loss    Previously been followed by hematology for iron infusion every 2 weeks and as of 2019; full GI evaluation including capsule endoscopy negative.  . Morbid obesity due to excess calories ([place])   . Osteoarthritis of left knee   . Prediabetes   . Small bowel obstruction ([place])    as a child  . Speech impediment    Stutter / stammer    PAST SURGICAL HISTORY   Past Surgical History:  Procedure Laterality Date  . ABDOMINAL WALL DEFECT REPAIR  1970  . IVC FILTER INSERTION  2017  . OOPHORECTOMY  1996  . OOPHORECTOMY  1997  . PORTACATH PLACEMENT  2014    Immunization History  Administered Date(s) Administered  . Influenza,inj,Quad PF,6+ Mos 06/16/2020  . Tdap 06/16/2020  . Zoster Recombinat (Shingrix) 04/07/2020    MEDICATIONS/ALLERGIES   Current Meds  Medication Sig  . acetaminophen (TYLENOL) 500 MG tablet Take 1 tablet (500 mg total) by mouth every 6 (six) hours as needed.  [name] amLODipine (NORVASC) 5 MG tablet Take 0.5 tablets (2.5 mg total) by mouth daily.  [name] apixaban (ELIQUIS) 5 MG TABS tablet Take 1 tablet (5 mg total) by mouth 2 (two) times daily.  [name] atorvastatin (LIPITOR) 40 MG tablet Take 1 tablet (40 mg total) by mouth daily.  . busPIRone (BUSPAR) 15 MG tablet Take 15 mg by mouth daily.  . carvedilol (COREG) 25 MG tablet Take 1 tablet (25 mg total) by mouth 2 (two) times daily with a meal.  . cholecalciferol (VITAMIN D3) 25 MCG (1000 UNIT) tablet Take 1 tablet (1,000 Units total) by mouth daily.  . fluticasone (FLOVENT HFA) 110 MCG/ACT inhaler Inhale 2 puffs into the lungs daily.  [name] lidocaine (LIDODERM) 5 % Place 1 patch onto the skin daily. Remove & Discard patch within 12 hours or as directed by MD  . methocarbamol (ROBAXIN) 500 MG tablet Take 1 tablet (500 mg total) by mouth daily as needed for muscle spasms.  [name] omeprazole (PRILOSEC) 20 MG capsule Take 20 mg by mouth daily.  [name] PARoxetine  (PAXIL) 10 MG tablet Take 1 tablet (10 mg total) by mouth daily.  . potassium chloride SA (KLOR-CON) 20 MEQ tablet Take 1 tablet (20 mEq total) by mouth daily.  [name] PROAIR HFA 108 (90 Base) MCG/ACT inhaler INHALE TWO PUFFS BY MOUTH EVERY 6 HOURS AS NEEDED FOR WHEEZING OR FOR SHORTNESS OF BREATH  . spironolactone (ALDACTONE) 25 MG tablet Take 1 tablet (25 mg total) by mouth daily.  . vitamin B-12 (CYANOCOBALAMIN) 1000 MCG tablet Take 1,000 mcg by mouth daily.  . [DISCONTINUED] furosemide (LASIX) 80 MG tablet Take 1 tablet (80 mg total) by mouth daily.  . [DISCONTINUED] valsartan (DIOVAN) 80 MG tablet Take 1 tablet (80 mg total) by mouth daily.    Allergies  Allergen Reactions  . Ace Inhibitors Rash and Other (See Comments)    Make pt bleed  . Aspirin Other (See Comments)    Per patient paperwork: blood clot?  Likely because of chronic DOAC  . Hydromorphone Hives and Itching  . Vancomycin Itching and Rash  . Contrast Media [Iodinated Diagnostic Agents] Hives  . Dilaudid [Hydromorphone Hcl] Hives    SOCIAL HISTORY/FAMILY HISTORY   Reviewed in Epic:  Pertinent findings:  Social History   Tobacco Use  . Smoking status: Former Research scientist (life sciences)  . Smokeless tobacco: Never Used  Vaping Use  . Vaping Use: Never used  Substance Use Topics  . Alcohol use: Not Currently  . Drug use: Not Currently   Social History   Social History Narrative  . Not on file    OBJCTIVE -PE, EKG, labs   Wt Readings from Last 3 Encounters:  06/30/20 (!) 326 lb (147.9 kg)  06/23/20 (!) 332 lb (150.6 kg)  06/16/20 (!) 349 lb 3.2 oz (158.4 kg)  04/13/2020: Here 333 lb 9.06oz (151.3 kg)  Physical Exam: BP 124/72   Pulse 89   Ht '5\' 6"'$  (1.676 m)   Wt (!) 326 lb (147.9 kg)   SpO2 98%   BMI 52.62 kg/m  Physical Exam Vitals reviewed.  Constitutional:      Appearance: She is ill-appearing (Chronically ill-appearing, but no acute distress, nontoxic.).     Comments: Super morbidly obese.  Well-groomed.  HENT:      Head: Normocephalic and atraumatic.  Neck:     Vascular: No carotid bruit or JVD (Unable to assess).  Cardiovascular:     Rate and Rhythm: Normal rate and regular rhythm.  No extrasystoles are present.    Chest Wall: PMI is not displaced (Unable to palpate).     Pulses: Decreased pulses (Difficult to palpate because of swelling and compression stockings.).     Heart sounds: S1 normal and S2 normal. Heart sounds are distant. No murmur heard. No friction rub. No gallop.   Pulmonary:     Effort: Pulmonary effort is normal. No respiratory distress.     Breath sounds: Normal breath sounds.     Comments: Breath sounds distant due to body habitus, but no rales or rhonchi. Chest:     Chest wall: No tenderness.  Musculoskeletal:        General: Swelling (Wearing compression hose.  Does have trace blood at the site of her wound.  Appears to be well-healing.) present.     Cervical back: Normal range of motion and neck supple.     Right lower leg: Edema (1-2+, nonpitting) present.     Left lower leg: Edema (2+, nonpitting firm edema) present.  Skin:    General: Skin is warm.     Comments: Left leg wound appears to be healing well.  Neurological:     General: No focal deficit present.     Mental Status: She is alert and oriented to person, place, and time.     Gait: Gait abnormal (Slow deliberate gait; rocking from side to side).  Psychiatric:        Mood and Affect: Mood normal.        Behavior: Behavior normal.        Thought Content: Thought content normal.        Judgment: Judgment normal.      Adult ECG Report N/A  Recent Labs: Not available  No results found for: CHOL, HDL, LDLCALC, LDLDIRECT, TRIG, CHOLHDL Lab Results  Component  Value Date   CREATININE 0.92 05/11/2020   BUN 9 05/11/2020   NA 139 05/11/2020   K 3.9 05/11/2020   CL 100 05/11/2020   CO2 30 05/11/2020   CBC Latest Ref Rng & Units 05/11/2020 05/04/2020 02/08/2020  WBC 4.0 - 10.5 K/uL 6.3 5.1 5.0  Hemoglobin  12.0 - 15.0 g/dL 10.1(L) 9.8(L) 10.1(L)  Hematocrit 36.0 - 46.0 % 34.5(L) 34.0(L) 35.3(L)  Platelets 150 - 400 K/uL 165 132(L) 148(L)    No results found for: TSH  ==================================================  COVID-19 Education: The signs and symptoms of COVID-19 were discussed with the patient and how to seek care for testing (follow up with PCP or arrange E-visit).   The importance of social distancing and COVID-19 vaccination was discussed today. The patient is practicing social distancing & Masking.   I spent a total of 8mnutes with the patient spent in direct patient consultation.  Additional time spent with chart review  / charting (studies, outside notes, etc): 15 min Total Time: 32 min   Current medicines are reviewed at length with the patient today.  (+/- concerns) n/a  This visit occurred during the SARS-CoV-2 public health emergency.  Safety protocols were in place, including screening questions prior to the visit, additional usage of staff PPE, and extensive cleaning of exam room while observing appropriate contact time as indicated for disinfecting solutions.  Notice: This dictation was prepared with Dragon dictation along with smaller phrase technology. Any transcriptional errors that result from this process are unintentional and may not be corrected upon review.  Patient Instructions / Medication Changes & Studies & Tests Ordered   Patient Instructions  Medication Instructions:  No changes  refilled valsartan   *If you need a refill on your cardiac medications before your next appointment, please call your pharmacy*   Lab Work: Not needed .   Testing/Procedures:  Not needed  Follow-Up: At [facility] you and your health needs are our priority.  As part of our continuing mission to provide you with exceptional heart care, we have created designated Provider Care Teams.  These Care Teams include your primary Cardiologist (physician) and  Advanced Practice Providers (APPs -  Physician Assistants and Nurse Practitioners) who all work together to provide you with the care you need, when you need it.     Your next appointment:   6 month(s) Sept 2022  The format for your next appointment:   In Person  Provider:   DGlenetta Hew MD      Studies Ordered:   No orders of the defined types were placed in this encounter.    DGlenetta Hew M.D., M.S. Interventional Cardiologist   Pager # [phone] # [phone] [place] [address]  Thank you for choosing [facility]!

## 2020-06-30 ENCOUNTER — Other Ambulatory Visit: Payer: Self-pay

## 2020-06-30 ENCOUNTER — Encounter: Payer: Self-pay | Admitting: Cardiology

## 2020-06-30 ENCOUNTER — Ambulatory Visit (INDEPENDENT_AMBULATORY_CARE_PROVIDER_SITE_OTHER): Payer: Medicaid Other | Admitting: Cardiology

## 2020-06-30 VITALS — BP 124/72 | HR 89 | Ht 66.0 in | Wt 326.0 lb

## 2020-06-30 DIAGNOSIS — D500 Iron deficiency anemia secondary to blood loss (chronic): Secondary | ICD-10-CM

## 2020-06-30 DIAGNOSIS — I5032 Chronic diastolic (congestive) heart failure: Secondary | ICD-10-CM

## 2020-06-30 DIAGNOSIS — I872 Venous insufficiency (chronic) (peripheral): Secondary | ICD-10-CM | POA: Diagnosis not present

## 2020-06-30 DIAGNOSIS — I82562 Chronic embolism and thrombosis of left calf muscular vein: Secondary | ICD-10-CM | POA: Diagnosis not present

## 2020-06-30 DIAGNOSIS — I10 Essential (primary) hypertension: Secondary | ICD-10-CM | POA: Diagnosis not present

## 2020-06-30 MED ORDER — VALSARTAN 80 MG PO TABS: 80.0000 mg | ORAL_TABLET | Freq: Every day | ORAL | 3 refills | Status: DC

## 2020-06-30 NOTE — Patient Instructions (Signed)
Medication Instructions:  No changes  refilled valsartan   *If you need a refill on your cardiac medications before your next appointment, please call your pharmacy*   Lab Work: Not needed .   Testing/Procedures:  Not needed  Follow-Up: At [facility], you and your health needs are our priority.  As part of our continuing mission to provide you with exceptional heart care, we have created designated Provider Care Teams.  These Care Teams include your primary Cardiologist (physician) and Advanced Practice Providers (APPs -  Physician Assistants and Nurse Practitioners) who all work together to provide you with the care you need, when you need it.     Your next appointment:   6 month(s) Sept 2022  The format for your next appointment:   In Person  Provider:   Glenetta Hew, MD

## 2020-07-08 ENCOUNTER — Encounter (INDEPENDENT_AMBULATORY_CARE_PROVIDER_SITE_OTHER): Payer: Self-pay

## 2020-07-13 ENCOUNTER — Other Ambulatory Visit: Payer: Self-pay | Admitting: Internal Medicine

## 2020-07-13 DIAGNOSIS — I10 Essential (primary) hypertension: Secondary | ICD-10-CM

## 2020-07-13 DIAGNOSIS — I5032 Chronic diastolic (congestive) heart failure: Secondary | ICD-10-CM

## 2020-07-23 ENCOUNTER — Encounter: Payer: Self-pay | Admitting: Cardiology

## 2020-07-23 NOTE — Assessment & Plan Note (Signed)
Refer to Dr. Lucky Cowboy from [facility].  Recommends foot elevation, compression stockings and wound care.  She does have postphlebitic symptoms as well as lymphedema.  She is on chronic Eliquis for venous stasis and prior DVT.  Continue recommendations per Dr. Lucky Cowboy

## 2020-07-23 NOTE — Assessment & Plan Note (Signed)
I do not think her edema is cardiac in nature.  She does not have orthopnea or PND.  I suspect is probably more related to venous stasis.  More so, lymphedema, based on Doppler findings.  Was referred to Dr. Lazaro Arms, who recommends similar to my suggestions-compression stockings and foot elevation.  She does take furosemide daily, and I recommended that for worsening days of edema, she can take extra dose.  She is also on spironolactone.  She is on carvedilol at max dose, low-dose amlodipine along with recently added valsartan which we will refill today.

## 2020-07-23 NOTE — Assessment & Plan Note (Signed)
Seen by Dr. Lucky Cowboy from [facility].  IVC filter in place.  On apixaban. Compression stockings, foot elevation and diuretics.

## 2020-07-23 NOTE — Assessment & Plan Note (Signed)
Blood pressure looks much better.  ARB on board.  In the future, would potentially want to see if we can get her off of Norvasc and on the higher dose of ARB, but for now since she is stable we will continue current meds.  Next visit we will titrate valsartan to 160 mg daily and DC Norvasc.

## 2020-07-23 NOTE — Assessment & Plan Note (Signed)
Intermittent transfusions.  Energy level is notably affected by anemia.

## 2020-07-24 ENCOUNTER — Telehealth: Payer: Self-pay | Admitting: Internal Medicine

## 2020-07-24 NOTE — Telephone Encounter (Signed)
I attempted to reach [name] today to get her scheduled for a phone visit with the Managed Medicaid Team. I left her a message with my contact info on her voicemail.

## 2020-08-11 ENCOUNTER — Other Ambulatory Visit: Payer: Self-pay | Admitting: Internal Medicine

## 2020-08-11 DIAGNOSIS — M546 Pain in thoracic spine: Secondary | ICD-10-CM

## 2020-08-11 DIAGNOSIS — G8929 Other chronic pain: Secondary | ICD-10-CM

## 2020-08-11 NOTE — Telephone Encounter (Signed)
Requested medication (s) are due for refill today: Yes  Requested medication (s) are on the active medication list: Yes  Last refill:  06/16/20  Future visit scheduled: No  Notes to clinic:  See request.    Requested Prescriptions  Pending Prescriptions Disp Refills   methocarbamol (ROBAXIN) 500 MG tablet [Pharmacy Med Name: METHOCARBAMOL 500 MG TABLET] 30 tablet 1    Sig: TAKE ONE TABLET BY MOUTH DAILY AS NEEDED FOR MUSCLE SPASMS      Not Delegated - Analgesics:  Muscle Relaxants Failed - 08/11/2020  1:40 PM      Failed - This refill cannot be delegated      Passed - Valid encounter within last 6 months    Recent Outpatient Visits           1 month ago Chronic midline thoracic back pain   [place] Ladell Pier, MD   4 months ago Establishing care with new doctor, encounter for   [place] Ladell Pier, MD

## 2020-08-15 ENCOUNTER — Other Ambulatory Visit: Payer: Self-pay | Admitting: Internal Medicine

## 2020-08-15 DIAGNOSIS — Z8709 Personal history of other diseases of the respiratory system: Secondary | ICD-10-CM

## 2020-08-22 NOTE — Progress Notes (Signed)
HEMATOLOGY/ONCOLOGY CONSULTATION NOTE  Date of Service: 08/23/2020  Patient Care Team: Ladell Pier, MD as PCP - General (Internal Medicine)  CHIEF COMPLAINTS/PURPOSE OF CONSULTATION:  Anemia  HISTORY OF PRESENTING ILLNESS:   [name] is a wonderful 50 y.o. female who has been referred to [place] by Dr. [name] for evaluation and management of anemia. The pt reports that she is doing well overall.   The pt reports that she has had anemia since birth but is unaware of any genetic condition causing her anemia. She received IV iron for about 4 years but stopped 2 years ago when she moved to [place]. Pt had a Port-a-Cath placed for repeat IV Iron Infusions due to issues accessing her veins.   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 She has been on anticoagulation for nearly 10 years and denies any repeat blood clots since starting Eliquis. Her PCP is currently managing her anticoagulation. 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 The ulcer tends to heal in the winter and reopen in the summer. It is currently healed with no open area.  Pt had a Colonoscopy and EGD at [facility] in [place] in August of 2021 that were benign. She was experiencing bloody stools frequently but hasn't had any after her 2021 GI studies. She has discontinued Aspirin due to concerns for excessive blood loss.   Pt has had a bilateral oophorectomies. She does not recall being diagnosed with PCOS. She denies gluten intolerance or any thyroid dysfunction. Pt had a fall in 2019 that is causing ongong back pain. She was seen after the fall and was found to have injury at L1 & L2. She has been given Tylenol for pain management. Pt has a history of chronic acid suppression. She had stopped but recently restarted Prilosec. She was Vitamin B12 deficient for about one year. Pt had a heart  attack in but denies any history of liver disease. Pt has had her COVID19 vaccines and booster.  Most recent lab results  of CBC w/diff is as follows: all values are WNL except for RBC at 3.80, Hgb at 10.1, HCT at 35.3, MCHC at 28.6, RDW at 16.2, PLT at 148K.  On review of systems, pt reports fatigue and denies nose bleeds, gum bleeds, bloody stools, heavy menstrual losses, fevers, unexpected weight loss, chills, new back pain, abdominal pain and any other symptoms.   On PMHx the pt reports B/L Oophorectomy, Myocardial infarction, DVT, Iron deficiency anemia due to chronic blood loss.  INTERVAL HISTORY   [name] is a wonderful 50 y.o. female who is here today for f/u regarding evaluation and management of anemia. The patient's last visit with [place] was on 05/04/2020. The pt reports that she is doing well overall.  The pt reports that she has been doing better since receiving the two doses of IV Iron Injectafer. The pt noted no issues with these infusions. The pt notes the leg ulcer is completely healed and denies leg swelling. The pt notes she felt better for 1-2 months, but has gradually becoming more fatigued recently.  The pt notes that she is experiencing issues with her Port causing pain and movement of the Port. The pt notes it is irritating and she had it placed in [place] before she moved here to [facility]. The pt notes that it intermittently affects her breathing. The pt notes  she has had the Port for around five years. She notes that this was mainly used for IV iron due to her being a hard stick.  Lab results today 08/23/2020 of CBC w/diff and CMP is as follows: all values are WNL except for RBC of 3.81, Hgb of 10.5, HCT of 35.7, MCHC of 29.4, Plt of 146K, AST of 11. 08/23/2020 Iron of 62, Sat Ratio of 20. 08/23/2020 Ferritin of 111.  On review of systems, pt reports fatigue, Port pain upon exertion and laying down and denies bleeding issues, gum bleeds, nose bleeds, bloody/black  stools, leg swelling, and any other symptoms.  MEDICAL HISTORY:  Past Medical History:  Diagnosis Date  . CAD (coronary artery disease)    PT STATES - HAD A CARDIAC CATH - NOT TOLD SHE HAD CAD -> week note from [place] indicates history of MI (patient cannot corroborate  . Cellulitis   . CHF (congestive heart failure) ([place])   . 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 Generalized anxiety disorder   . H/O gastric ulcer 11/16/2018  . History of small bowel obstruction    In childhood  . Hypertension   . Iron deficiency anemia due to chronic blood loss    Previously been followed by hematology for iron infusion every 2 weeks and as of 2019; full GI evaluation including capsule endoscopy negative.  . Morbid obesity due to excess calories ([place])   . Osteoarthritis of left knee   . Prediabetes   . Small bowel obstruction ([place])    as a child  . Speech impediment    Stutter / stammer    SURGICAL HISTORY: Past Surgical History:  Procedure Laterality Date  . ABDOMINAL WALL DEFECT REPAIR  1970  . IVC FILTER INSERTION  2017  . OOPHORECTOMY  1996  . OOPHORECTOMY  1997  . PORTACATH PLACEMENT  2014    SOCIAL HISTORY: Social History   Socioeconomic History  . Marital status: Single    Spouse name: Not on file  . Number of children: 0  . Years of education: Not on file  . Highest education level: 12th grade  Occupational History  . Occupation: unemployed on [place] Use  . Smoking status: Former Research scientist (life sciences)  . Smokeless tobacco: Never Used  Vaping Use  . Vaping Use: Never used  Substance and Sexual Activity  . Alcohol use: Not Currently  . Drug use: Not Currently  . Sexual activity: Not Currently  Other Topics Concern  . Not on file  Social History Narrative  . Not on file   Social Determinants of Health   Financial Resource Strain: Not on file  Food Insecurity: No Food Insecurity  . Worried  About Charity fundraiser in the Last Year: Never true  . Ran Out of Food in the Last Year: Never true  Transportation Needs: No Transportation Needs  . Lack of Transportation (Medical): No  . Lack of Transportation (Non-Medical): No  Physical Activity: Not on file  Stress: Not on file  Social Connections: Not on file  Intimate Partner Violence: Not on file    FAMILY HISTORY: Family History  Problem Relation Age of Onset  . Diabetes Mellitus II Mother   . COPD Father   . Diabetes Father   . Diabetes Mellitus II Maternal Grandmother   . Breast cancer Paternal Grandfather     ALLERGIES:  is allergic to ace inhibitors, aspirin, hydromorphone, vancomycin, contrast media [iodinated  diagnostic agents], and dilaudid [hydromorphone hcl].  MEDICATIONS:  Current Outpatient Medications  Medication Sig Dispense Refill  . acetaminophen (TYLENOL) 500 MG tablet Take 1 tablet (500 mg total) by mouth every 6 (six) hours as needed. 60 tablet 0  . amLODipine (NORVASC) 5 MG tablet Take 0.5 tablets (2.5 mg total) by mouth daily. 15 tablet 6  . apixaban (ELIQUIS) 5 MG TABS tablet Take 1 tablet (5 mg total) by mouth 2 (two) times daily. 60 tablet 5  . atorvastatin (LIPITOR) 40 MG tablet Take 1 tablet (40 mg total) by mouth daily. 30 tablet 6  . busPIRone (BUSPAR) 15 MG tablet Take 15 mg by mouth daily.    . carvedilol (COREG) 25 MG tablet Take 1 tablet (25 mg total) by mouth 2 (two) times daily with a meal. 60 tablet 6  . cholecalciferol (VITAMIN D3) 25 MCG (1000 UNIT) tablet Take 1 tablet (1,000 Units total) by mouth daily. 30 tablet 5  . fluticasone (FLOVENT HFA) 110 MCG/ACT inhaler Inhale 2 puffs into the lungs daily.    . furosemide (LASIX) 80 MG tablet TAKE ONE TABLET BY MOUTH DAILY 30 tablet 3  . lidocaine (LIDODERM) 5 % Place 1 patch onto the skin daily. Remove & Discard patch within 12 hours or as directed by MD 30 patch 0  . methocarbamol (ROBAXIN) 500 MG tablet TAKE ONE TABLET BY MOUTH DAILY  AS NEEDED FOR MUSCLE SPASMS 30 tablet 0  . omeprazole (PRILOSEC) 20 MG capsule Take 20 mg by mouth daily.    [name] PARoxetine (PAXIL) 10 MG tablet Take 1 tablet (10 mg total) by mouth daily. 30 tablet 6  . potassium chloride SA (KLOR-CON) 20 MEQ tablet Take 1 tablet (20 mEq total) by mouth daily. 30 tablet 6  . PROAIR HFA 108 (90 Base) MCG/ACT inhaler INHALE TWO PUFFS BY MOUTH EVERY 6 HOURS AS NEEDED FOR WHEEZING OR FOR SHORTNESS OF BREATH 8.5 g 0  . spironolactone (ALDACTONE) 25 MG tablet Take 1 tablet (25 mg total) by mouth daily. 30 tablet 6  . valsartan (DIOVAN) 80 MG tablet Take 1 tablet (80 mg total) by mouth daily. 90 tablet 3  . vitamin B-12 (CYANOCOBALAMIN) 1000 MCG tablet Take 1,000 mcg by mouth daily.     No current facility-administered medications for this visit.    REVIEW OF SYSTEMS:   10 Point review of Systems was done is negative except as noted above.  PHYSICAL EXAMINATION: ECOG PERFORMANCE STATUS: 2 - Symptomatic, <50% confined to bed  . Vitals:   08/23/20 1500  BP: 137/79  Pulse: 80  Resp: 18  Temp: 97.6 F (36.4 C)  SpO2: 97%   Filed Weights   08/23/20 1500  Weight: (!) 343 lb 8 oz (155.8 kg)   .Body mass index is 55.44 kg/m.   Exam was given in a chair.   GENERAL:alert, in no acute distress and comfortable SKIN: no acute rashes, no significant lesions EYES: conjunctiva are pink and non-injected, sclera anicteric OROPHARYNX: MMM, no exudates, no oropharyngeal erythema or ulceration NECK: supple, no JVD LYMPH:  no palpable lymphadenopathy in the cervical, axillary or inguinal regions LUNGS: clear to auscultation b/l with normal respiratory effort HEART: regular rate & rhythm ABDOMEN:  normoactive bowel sounds , non tender, not distended. Extremity: 2+ pedal edema b/l PSYCH: alert & oriented x 3 with fluent speech NEURO: no focal motor/sensory deficits  LABORATORY DATA:  I have reviewed the data as listed  . CBC Latest Ref Rng & Units 08/23/2020  05/11/2020  05/04/2020  WBC 4.0 - 10.5 K/uL 5.7 6.3 5.1  Hemoglobin 12.0 - 15.0 g/dL 10.5(L) 10.1(L) 9.8(L)  Hematocrit 36.0 - 46.0 % 35.7(L) 34.5(L) 34.0(L)  Platelets 150 - 400 K/uL 146(L) 165 132(L)   . CBC    Component Value Date/Time   WBC 5.7 08/23/2020 1423   WBC 6.3 05/11/2020 1738   RBC 3.81 (L) 08/23/2020 1423   HGB 10.5 (L) 08/23/2020 1423   HCT 35.7 (L) 08/23/2020 1423   PLT 146 (L) 08/23/2020 1423   MCV 93.7 08/23/2020 1423   MCH 27.6 08/23/2020 1423   MCHC 29.4 (L) 08/23/2020 1423   RDW 14.2 08/23/2020 1423   LYMPHSABS 0.7 08/23/2020 1423   MONOABS 0.4 08/23/2020 1423   EOSABS 0.1 08/23/2020 1423   BASOSABS 0.0 08/23/2020 1423    . CMP Latest Ref Rng & Units 08/23/2020 05/11/2020 05/04/2020  Glucose 70 - 99 mg/dL 99 92 87  BUN 6 - 20 mg/dL '10 9 11  '$ Creatinine 0.44 - 1.00 mg/dL 0.78 0.92 0.76  Sodium 135 - 145 mmol/L 141 139 139  Potassium 3.5 - 5.1 mmol/L 4.3 3.9 4.2  Chloride 98 - 111 mmol/L 102 100 103  CO2 22 - 32 mmol/L '29 30 27  '$ Calcium 8.9 - 10.3 mg/dL 9.2 9.2 9.5  Total Protein 6.5 - 8.1 g/dL 8.0 7.7 7.9  Total Bilirubin 0.3 - 1.2 mg/dL 0.8 0.9 1.0  Alkaline Phos 38 - 126 U/L 91 71 74  AST 15 - 41 U/L 11(L) 14(L) 11(L)  ALT 0 - 44 U/L <6 10 <6    RADIOGRAPHIC STUDIES: I have personally reviewed the radiological images as listed and agreed with the findings in the report. No results found.  ASSESSMENT & PLAN:   50 yo with   1) Chronic iron deficiency anemia requiring recurrent IV Iron infusions. Likely from chronic GI losses and previously due to blood loss from venous stasis ulcer in the setting of chronic anticoagulation. 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 Patient Active Problem List   Diagnosis Date Noted  . Lower limb ulcer, calf, left, limited to breakdown of skin ([place]) 06/09/2020  . Lymphedema  06/09/2020  . Iron deficiency anemia due to chronic blood loss 05/10/2020  . 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 Venous insufficiency 04/13/2020  . Presence of IVC filter 04/13/2020  . Chronic anemia 04/13/2020  . Resistant hypertension 04/13/2020  . (HFpEF) heart failure with preserved ejection fraction ([place]) 04/13/2020  . CAD (coronary artery disease) 11/17/2019   PLAN: -Discussed patient's most recent labs today, 08/23/2020; Hgb improved from last visit, RDW normal. Chemistries stable. Iron Sat borderline low at 20 but improved since last visit of 14. Ferritin 111. - no indication for additional IV Iron at this time -Advised pt that her Hgb and Iron Sat have improved, but could be gradually decreasing over the last month. -Advised pt that her current Hgb levels would not be able to explain her severe fatigued. Recommended f/u w PCP to evaluate this. -Recommended pt take a Vitamin B-Complex daily. -Will send referral to interventional radiology to evaluate Port placement and recent pain. -Will see back in 6 months with labs.    FOLLOW UP: IR referral for port a cath evaluation and possible replacement Portflush in 3 months RTC with Dr Irene Limbo with labs  in 6 months   No orders of the defined types were placed in this encounter.   All of the patients questions were answered with apparent satisfaction. The patient knows to call the clinic with any problems, questions or concerns.  The total time spent in the appointment was 20 minutes and more than 50% was on counseling and direct patient cares.     Sullivan Lone MD [employer] AAHIVMS [facility] Hematology/Oncology Physician [facility]  (Office):       [phone] (Work cell):  [phone] (Fax):           [phone]  08/23/2020 3:53 PM  I, Reinaldo Raddle, am acting as scribe for Dr. Sullivan Lone, MD.

## 2020-08-23 ENCOUNTER — Inpatient Hospital Stay: Payer: Medicaid Other | Attending: Hematology | Admitting: Hematology

## 2020-08-23 ENCOUNTER — Other Ambulatory Visit: Payer: Self-pay

## 2020-08-23 ENCOUNTER — Inpatient Hospital Stay: Payer: Medicaid Other

## 2020-08-23 VITALS — BP 137/79 | HR 80 | Temp 97.6°F | Resp 18 | Ht 66.0 in | Wt 343.5 lb

## 2020-08-23 DIAGNOSIS — D500 Iron deficiency anemia secondary to blood loss (chronic): Secondary | ICD-10-CM

## 2020-08-23 DIAGNOSIS — D649 Anemia, unspecified: Secondary | ICD-10-CM | POA: Insufficient documentation

## 2020-08-23 LAB — CMP (CANCER CENTER ONLY)
ALT: <6 U/L (ref 0–44)
AST: 11 U/L — ABNORMAL LOW (ref 15–41)
Albumin: 3.7 g/dL (ref 3.5–5.0)
Alkaline Phosphatase: 91 U/L (ref 38–126)
Anion gap: 10 (ref 5–15)
BUN: 10 mg/dL (ref 6–20)
CO2: 29 mmol/L (ref 22–32)
Calcium: 9.2 mg/dL (ref 8.9–10.3)
Chloride: 102 mmol/L (ref 98–111)
Creatinine: 0.78 mg/dL (ref 0.44–1.00)
GFR, Estimated: >60 mL/min (ref >60)
Glucose, Bld: 99 mg/dL (ref 70–99)
Potassium: 4.3 mmol/L (ref 3.5–5.1)
Sodium: 141 mmol/L (ref 135–145)
Total Bilirubin: 0.8 mg/dL (ref 0.3–1.2)
Total Protein: 8 g/dL (ref 6.5–8.1)

## 2020-08-23 LAB — CBC WITH DIFFERENTIAL (CANCER CENTER ONLY)
Abs Immature Granulocytes: 0.02 10*3/uL (ref 0.00–0.07)
Basophils Absolute: 0 10*3/uL (ref 0.0–0.1)
Basophils Relative: 0 %
Eosinophils Absolute: 0.1 10*3/uL (ref 0.0–0.5)
Eosinophils Relative: 1 %
HCT: 35.7 % — ABNORMAL LOW (ref 36.0–46.0)
Hemoglobin: 10.5 g/dL — ABNORMAL LOW (ref 12.0–15.0)
Immature Granulocytes: 0 %
Lymphocytes Relative: 12 %
Lymphs Abs: 0.7 10*3/uL (ref 0.7–4.0)
MCH: 27.6 pg (ref 26.0–34.0)
MCHC: 29.4 g/dL — ABNORMAL LOW (ref 30.0–36.0)
MCV: 93.7 fL (ref 80.0–100.0)
Monocytes Absolute: 0.4 10*3/uL (ref 0.1–1.0)
Monocytes Relative: 8 %
Neutro Abs: 4.5 10*3/uL (ref 1.7–7.7)
Neutrophils Relative %: 79 %
Platelet Count: 146 10*3/uL — ABNORMAL LOW (ref 150–400)
RBC: 3.81 MIL/uL — ABNORMAL LOW (ref 3.87–5.11)
RDW: 14.2 % (ref 11.5–15.5)
WBC Count: 5.7 10*3/uL (ref 4.0–10.5)
nRBC: 0 % (ref 0.0–0.2)

## 2020-08-23 LAB — IRON AND TIBC
Iron: 62 ug/dL (ref 41–142)
Saturation Ratios: 20 % — ABNORMAL LOW (ref 21–57)
TIBC: 311 ug/dL (ref 236–444)
UIBC: 250 ug/dL (ref 120–384)

## 2020-08-23 LAB — FERRITIN: Ferritin: 111 ng/mL (ref 11–307)

## 2020-08-24 ENCOUNTER — Telehealth: Payer: Self-pay | Admitting: Hematology

## 2020-08-24 NOTE — Telephone Encounter (Signed)
Scheduled follow-up appointments per 4/27 los. Patient is aware. 

## 2020-08-25 DIAGNOSIS — H5213 Myopia, bilateral: Secondary | ICD-10-CM | POA: Diagnosis not present

## 2020-08-31 ENCOUNTER — Other Ambulatory Visit: Payer: Self-pay | Admitting: Hematology

## 2020-08-31 DIAGNOSIS — D500 Iron deficiency anemia secondary to blood loss (chronic): Secondary | ICD-10-CM

## 2020-09-06 ENCOUNTER — Other Ambulatory Visit: Payer: Self-pay | Admitting: Internal Medicine

## 2020-09-06 DIAGNOSIS — Z8709 Personal history of other diseases of the respiratory system: Secondary | ICD-10-CM

## 2020-09-06 NOTE — Telephone Encounter (Signed)
Requested medication (s) are due for refill today: no  Requested medication (s) are on the active medication list: yes  Last refill: 08/15/2020  Future visit scheduled: no  Notes to clinic: One inhaler should last at least one month. If the patient is requesting refills earlier, contact the patient to check for uncontrolled symptoms    Requested Prescriptions  Pending Prescriptions Disp Refills   PROAIR HFA 108 (90 Base) MCG/ACT inhaler [Pharmacy Med Name: PROAIR HFA 90 MCG INHALER] 8.5 g 0    Sig: INHALE TWO PUFFS BY MOUTH EVERY 6 HOURS AS NEEDED FOR WHEEZING OR SHORTNESS OF BREATH      Pulmonology:  Beta Agonists Failed - 09/06/2020 12:41 PM      Failed - One inhaler should last at least one month. If the patient is requesting refills earlier, contact the patient to check for uncontrolled symptoms.      Passed - Valid encounter within last 12 months    Recent Outpatient Visits           2 months ago Chronic midline thoracic back pain   [place], MD   5 months ago Establishing care with new doctor, encounter for   [place] Ladell Pier, MD

## 2020-09-14 ENCOUNTER — Telehealth: Payer: Self-pay | Admitting: Internal Medicine

## 2020-09-14 NOTE — Telephone Encounter (Signed)
..   Medicaid Managed Care   Unsuccessful Outreach Note  09/14/2020 Name: [name] MRN: [id number] DOB: 1971-01-02  Referred by: Ladell Pier, MD Reason for referral : High Risk Managed Medicaid (I attempted to reach [name] today to get her scheduled with the [facility] RNCM for a phone visit. I left my name and number for her to call me back.)    A second unsuccessful telephone outreach was attempted today. The patient was referred to the case management team for assistance with care management and care coordination.   Follow Up Plan: The care management team will reach out to the patient again over the next 7-14 days.   [place]

## 2020-09-21 ENCOUNTER — Emergency Department (HOSPITAL_COMMUNITY): Payer: Medicaid Other

## 2020-09-21 ENCOUNTER — Inpatient Hospital Stay (HOSPITAL_COMMUNITY): Admission: RE | Admit: 2020-09-21 | Payer: Medicaid Other | Source: Ambulatory Visit

## 2020-09-21 ENCOUNTER — Other Ambulatory Visit: Payer: Self-pay

## 2020-09-21 ENCOUNTER — Emergency Department (HOSPITAL_COMMUNITY)
Admission: EM | Admit: 2020-09-21 | Discharge: 2020-09-21 | Disposition: A | Discharge status: Home / Self Care | Payer: Medicaid Other | Attending: Emergency Medicine | Admitting: Emergency Medicine

## 2020-09-21 ENCOUNTER — Ambulatory Visit: Payer: Self-pay | Admitting: *Deleted

## 2020-09-21 ENCOUNTER — Encounter (HOSPITAL_COMMUNITY): Payer: Self-pay | Admitting: *Deleted

## 2020-09-21 DIAGNOSIS — I110 Hypertensive heart disease with heart failure: Secondary | ICD-10-CM | POA: Insufficient documentation

## 2020-09-21 DIAGNOSIS — I509 Heart failure, unspecified: Secondary | ICD-10-CM | POA: Insufficient documentation

## 2020-09-21 DIAGNOSIS — Z87891 Personal history of nicotine dependence: Secondary | ICD-10-CM | POA: Diagnosis not present

## 2020-09-21 DIAGNOSIS — Z7901 Long term (current) use of anticoagulants: Secondary | ICD-10-CM | POA: Diagnosis not present

## 2020-09-21 DIAGNOSIS — Z79899 Other long term (current) drug therapy: Secondary | ICD-10-CM | POA: Insufficient documentation

## 2020-09-21 DIAGNOSIS — I2510 Atherosclerotic heart disease of native coronary artery without angina pectoris: Secondary | ICD-10-CM | POA: Diagnosis not present

## 2020-09-21 DIAGNOSIS — R519 Headache, unspecified: Secondary | ICD-10-CM | POA: Diagnosis not present

## 2020-09-21 DIAGNOSIS — I10 Essential (primary) hypertension: Secondary | ICD-10-CM | POA: Insufficient documentation

## 2020-09-21 LAB — COMPREHENSIVE METABOLIC PANEL
ALT: 11 U/L (ref 0–44)
AST: 15 U/L (ref 15–41)
Albumin: 3.8 g/dL (ref 3.5–5.0)
Alkaline Phosphatase: 72 U/L (ref 38–126)
Anion gap: 5 (ref 5–15)
BUN: 15 mg/dL (ref 6–20)
CO2: 29 mmol/L (ref 22–32)
Calcium: 9.7 mg/dL (ref 8.9–10.3)
Chloride: 104 mmol/L (ref 98–111)
Creatinine, Ser: 0.76 mg/dL (ref 0.44–1.00)
GFR, Estimated: >60 mL/min (ref >60)
Glucose, Bld: 114 mg/dL — ABNORMAL HIGH (ref 70–99)
Potassium: 4.6 mmol/L (ref 3.5–5.1)
Sodium: 138 mmol/L (ref 135–145)
Total Bilirubin: 0.6 mg/dL (ref 0.3–1.2)
Total Protein: 8.3 g/dL — ABNORMAL HIGH (ref 6.5–8.1)

## 2020-09-21 LAB — CBC WITH DIFFERENTIAL/PLATELET
Abs Immature Granulocytes: 0.01 10*3/uL (ref 0.00–0.07)
Basophils Absolute: 0 10*3/uL (ref 0.0–0.1)
Basophils Relative: 0 %
Eosinophils Absolute: 0 10*3/uL (ref 0.0–0.5)
Eosinophils Relative: 1 %
HCT: 37.8 % (ref 36.0–46.0)
Hemoglobin: 11.4 g/dL — ABNORMAL LOW (ref 12.0–15.0)
Immature Granulocytes: 0 %
Lymphocytes Relative: 11 %
Lymphs Abs: 0.6 10*3/uL — ABNORMAL LOW (ref 0.7–4.0)
MCH: 28.1 pg (ref 26.0–34.0)
MCHC: 30.2 g/dL (ref 30.0–36.0)
MCV: 93.1 fL (ref 80.0–100.0)
Monocytes Absolute: 0.4 10*3/uL (ref 0.1–1.0)
Monocytes Relative: 8 %
Neutro Abs: 4.2 10*3/uL (ref 1.7–7.7)
Neutrophils Relative %: 80 %
Platelets: 150 10*3/uL (ref 150–400)
RBC: 4.06 MIL/uL (ref 3.87–5.11)
RDW: 13.5 % (ref 11.5–15.5)
WBC: 5.2 10*3/uL (ref 4.0–10.5)
nRBC: 0 % (ref 0.0–0.2)

## 2020-09-21 LAB — CBG MONITORING, ED: Glucose-Capillary: 85 mg/dL (ref 70–99)

## 2020-09-21 MED ORDER — SODIUM CHLORIDE 0.9 % IV SOLN: INTRAVENOUS | Status: DC

## 2020-09-21 MED ORDER — SODIUM CHLORIDE 0.9 % IV BOLUS
1000.0000 mL | Freq: Once | INTRAVENOUS | Status: AC
  Administered 2020-09-21: 1000 mL via INTRAVENOUS

## 2020-09-21 MED ORDER — DEXAMETHASONE SODIUM PHOSPHATE 10 MG/ML IJ SOLN
10.0000 mg | Freq: Once | INTRAMUSCULAR | Status: AC
  Administered 2020-09-21: 10 mg via INTRAVENOUS
  Filled 2020-09-21: qty 1

## 2020-09-21 MED ORDER — KETOROLAC TROMETHAMINE 30 MG/ML IJ SOLN
30.0000 mg | Freq: Once | INTRAMUSCULAR | Status: AC
  Administered 2020-09-21: 30 mg via INTRAVENOUS
  Filled 2020-09-21: qty 1

## 2020-09-21 MED ORDER — METOCLOPRAMIDE HCL 5 MG/ML IJ SOLN
10.0000 mg | Freq: Once | INTRAMUSCULAR | Status: AC
  Administered 2020-09-21: 10 mg via INTRAVENOUS
  Filled 2020-09-21: qty 2

## 2020-09-21 MED ORDER — DIPHENHYDRAMINE HCL 50 MG/ML IJ SOLN
25.0000 mg | Freq: Once | INTRAMUSCULAR | Status: AC
  Administered 2020-09-21: 25 mg via INTRAVENOUS
  Filled 2020-09-21: qty 1

## 2020-09-21 MED ORDER — MORPHINE SULFATE (PF) 2 MG/ML IV SOLN
2.0000 mg | Freq: Once | INTRAVENOUS | Status: AC
Start: 2020-09-21 — End: 2020-09-21
  Administered 2020-09-21: 2 mg via INTRAVENOUS
  Filled 2020-09-21: qty 1

## 2020-09-21 NOTE — Discharge Instructions (Signed)
Rest at home and in a dark room.  Make an appointment follow-up with your primary care doctor for blood pressure recheck.  It is improved here with pain control for the headache.  Work-up for the headache without any acute findings.  Further rest in dark room hopefully the Decadron will kick in and resolve the headache by morning.

## 2020-09-21 NOTE — ED Triage Notes (Signed)
Pt complains of headache and dizziness x 4 days. Pt reports she needs glasses but they are not ready yet.

## 2020-09-21 NOTE — ED Notes (Signed)
Patient was assisted to the restroom and given a warm blanket.

## 2020-09-21 NOTE — ED Provider Notes (Signed)
[facility] DEPT Provider Note   CSN: [id number] Arrival date & time: 09/21/20  1115     History Chief Complaint  Patient presents with  . Headache    [name] is a 50 y.o. female.  Patient presenting with a complaint of a headache associated with some dizziness since Sunday.  The pain is in the forehead area.  Just above the eyes states the pain is 10 out of 10.  No fevers no nausea no vomiting patient states that she has had headaches like this in the past but they have always just resolved with Tylenol.  Never formally diagnosed with history of migraine but she did have headaches frequently like this when she was younger.  Has not had one like this for many years.  Past medical history significant for coronary artery disease congestive heart failure deep vein thrombosis.  Patient is on blood thinners for life.  Patient states there is some dizziness with it but no room spinning.  Also history of hypertension and prediabetic.        Past Medical History:  Diagnosis Date  . CAD (coronary artery disease)    PT STATES - HAD A CARDIAC CATH - NOT TOLD SHE HAD CAD -> week note from [place] indicates history of MI (patient cannot corroborate  . Cellulitis   . CHF (congestive heart failure) ([place])   . 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 Generalized anxiety disorder   . H/O gastric ulcer 11/16/2018  . History of small bowel obstruction    In childhood  . Hypertension   . Iron deficiency anemia due to chronic blood loss    Previously been followed by hematology for iron infusion every 2 weeks and as of 2019; full GI evaluation including capsule endoscopy negative.  . Morbid obesity due to excess calories ([place])   . Osteoarthritis of left knee   . Prediabetes   . Small bowel obstruction ([place])    as a child  . Speech impediment    Stutter / stammer    Patient  Active Problem List   Diagnosis Date Noted  . Lower limb ulcer, calf, left, limited to breakdown of skin ([place]) 06/09/2020  . Lymphedema 06/09/2020  . Iron deficiency anemia due to chronic blood loss 05/10/2020  . 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 Venous insufficiency 04/13/2020  . Presence of IVC filter 04/13/2020  . Chronic anemia 04/13/2020  . Resistant hypertension 04/13/2020  . (HFpEF) heart failure with preserved ejection fraction ([place]) 04/13/2020  . CAD (coronary artery disease) 11/17/2019    Past Surgical History:  Procedure Laterality Date  . ABDOMINAL WALL DEFECT REPAIR  1970  . IVC FILTER INSERTION  2017  . OOPHORECTOMY  1996  . OOPHORECTOMY  1997  . PORTACATH PLACEMENT  2014     OB History    Gravida  0   Para  0   Term  0   Preterm  0   AB  0   Living  0     SAB  0   IAB  0   Ectopic  0   Multiple  0   Live Births  0           Family History  Problem Relation Age of Onset  . Diabetes Mellitus II Mother   . COPD Father   . Diabetes Father   .  Diabetes Mellitus II Maternal Grandmother   . Breast cancer Paternal Grandfather     Social History   Tobacco Use  . Smoking status: Former Research scientist (life sciences)  . Smokeless tobacco: Never Used  Vaping Use  . Vaping Use: Never used  Substance Use Topics  . Alcohol use: Not Currently  . Drug use: Not Currently    Home Medications Prior to Admission medications   Medication Sig Start Date End Date Taking? Authorizing Provider  acetaminophen (TYLENOL) 500 MG tablet Take 500 mg by mouth every evening.   Yes [provider]  amLODipine (NORVASC) 5 MG tablet Take 0.5 tablets (2.5 mg total) by mouth daily. Patient taking differently: Take 2.5 mg by mouth every evening. 04/07/20  Yes Ladell Pier, MD  apixaban (ELIQUIS) 5 MG TABS tablet Take 1 tablet (5 mg total) by mouth 2 (two) times daily. 06/16/20  Yes Ladell Pier, MD   atorvastatin (LIPITOR) 40 MG tablet Take 1 tablet (40 mg total) by mouth daily. Patient taking differently: Take 40 mg by mouth every evening. 04/07/20  Yes Ladell Pier, MD  carvedilol (COREG) 25 MG tablet Take 1 tablet (25 mg total) by mouth 2 (two) times daily with a meal. 04/07/20  Yes Ladell Pier, MD  cholecalciferol (VITAMIN D3) 25 MCG (1000 UNIT) tablet Take 1 tablet (1,000 Units total) by mouth daily. 04/07/20  Yes Ladell Pier, MD  furosemide (LASIX) 80 MG tablet TAKE ONE TABLET BY MOUTH DAILY 07/13/20  Yes Ladell Pier, MD  methocarbamol (ROBAXIN) 500 MG tablet TAKE ONE TABLET BY MOUTH DAILY AS NEEDED FOR MUSCLE SPASMS 08/14/20  Yes Ladell Pier, MD  omeprazole (PRILOSEC) 20 MG capsule Take 20 mg by mouth daily. 12/15/19  Yes [provider]  PARoxetine (PAXIL) 10 MG tablet Take 1 tablet (10 mg total) by mouth daily. Patient taking differently: Take 10 mg by mouth every evening. 04/07/20  Yes Ladell Pier, MD  potassium chloride SA (KLOR-CON) 20 MEQ tablet Take 1 tablet (20 mEq total) by mouth daily. 04/07/20  Yes Ladell Pier, MD  PROAIR HFA 108 [phone] Base) MCG/ACT inhaler INHALE TWO PUFFS BY MOUTH EVERY 6 HOURS AS NEEDED FOR WHEEZING OR SHORTNESS OF BREATH 09/07/20  Yes Ladell Pier, MD  spironolactone (ALDACTONE) 25 MG tablet Take 1 tablet (25 mg total) by mouth daily. Patient taking differently: Take 25 mg by mouth every evening. 04/07/20  Yes Ladell Pier, MD  valsartan (DIOVAN) 80 MG tablet Take 1 tablet (80 mg total) by mouth daily. Patient taking differently: Take 80 mg by mouth every evening. 06/30/20  Yes Leonie Man, MD  vitamin B-12 (CYANOCOBALAMIN) 1000 MCG tablet Take 1,000 mcg by mouth daily.   Yes [provider]  acetaminophen (TYLENOL) 500 MG tablet Take 1 tablet (500 mg total) by mouth every 6 (six) hours as needed. Patient not taking: No sig reported 04/19/20   Ladell Pier, MD  lidocaine  (LIDODERM) 5 % Place 1 patch onto the skin daily. Remove & Discard patch within 12 hours or as directed by MD Patient not taking: No sig reported 06/16/20   Ladell Pier, MD    Allergies    Ace inhibitors, Aspirin, Hydromorphone, Vancomycin, Contrast media [iodinated diagnostic agents], and Dilaudid [hydromorphone hcl]  Review of Systems   Review of Systems  Constitutional: Negative for chills and fever.  HENT: Negative for congestion, rhinorrhea and sore throat.   Eyes: Negative for visual disturbance.  Respiratory: Negative  for cough and shortness of breath.   Cardiovascular: Negative for chest pain and leg swelling.  Gastrointestinal: Negative for abdominal pain, diarrhea, nausea and vomiting.  Genitourinary: Negative for dysuria.  Musculoskeletal: Negative for back pain and neck pain.  Skin: Negative for rash.  Neurological: Positive for dizziness and headaches. Negative for light-headedness.  Hematological: Does not bruise/bleed easily.  Psychiatric/Behavioral: Negative for confusion.    Physical Exam Updated Vital Signs BP (!) 141/101   Pulse 77   Temp 97.9 F (36.6 C) (Oral)   Resp 15   SpO2 97%   Physical Exam Vitals and nursing note reviewed.  Constitutional:      General: She is not in acute distress.    Appearance: She is well-developed.  HENT:     Head: Normocephalic and atraumatic.  Eyes:     Extraocular Movements: Extraocular movements intact.     Conjunctiva/sclera: Conjunctivae normal.     Pupils: Pupils are equal, round, and reactive to light.  Cardiovascular:     Rate and Rhythm: Normal rate and regular rhythm.     Heart sounds: No murmur heard.   Pulmonary:     Effort: Pulmonary effort is normal. No respiratory distress.     Breath sounds: Normal breath sounds.  Abdominal:     Palpations: Abdomen is soft.     Tenderness: There is no abdominal tenderness.  Musculoskeletal:        General: No swelling. Normal range of motion.     Cervical  back: Normal range of motion and neck supple. No rigidity.  Skin:    General: Skin is warm and dry.     Capillary Refill: Capillary refill takes less than 2 seconds.  Neurological:     General: No focal deficit present.     Mental Status: She is alert and oriented to person, place, and time.     Cranial Nerves: No cranial nerve deficit.     Sensory: No sensory deficit.     Motor: No weakness.     ED Results / Procedures / Treatments   Labs (all labs ordered are listed, but only abnormal results are displayed) Labs Reviewed  COMPREHENSIVE METABOLIC PANEL - Abnormal; Notable for the following components:      Result Value   Glucose, Bld 114 (*)    Total Protein 8.3 (*)    All other components within normal limits  CBC WITH DIFFERENTIAL/PLATELET - Abnormal; Notable for the following components:   Hemoglobin 11.4 (*)    Lymphs Abs 0.6 (*)    All other components within normal limits  CBG MONITORING, ED    EKG EKG Interpretation  Date/Time:  Thursday Sep 21 2020 14:51:52 EDT Ventricular Rate:  86 PR Interval:  168 QRS Duration: 105 QT Interval:  365 QTC Calculation: 437 R Axis:   25 Text Interpretation: Sinus rhythm Lateral infarct, age indeterminate No previous ECGs available Confirmed by Fredia Sorrow [phone]) on 09/21/2020 4:32:46 PM   Radiology CT Head Wo Contrast  Result Date: 09/21/2020 CLINICAL DATA:  Headache. EXAM: CT HEAD WITHOUT CONTRAST TECHNIQUE: Contiguous axial images were obtained from the base of the skull through the vertex without intravenous contrast. COMPARISON:  None. FINDINGS: Brain: No evidence of acute infarction, hemorrhage, hydrocephalus, extra-axial collection or mass lesion/mass effect. Vascular: No hyperdense vessel or unexpected calcification. Skull: Normal. Negative for fracture or focal lesion. Sinuses/Orbits: Visualized paranasal sinuses are clear. Other: None. IMPRESSION: Negative head CT. Electronically Signed   By: Markus Daft M.D.   On:  09/21/2020 17:21    Procedures Procedures   Medications Ordered in ED Medications  0.9 %  sodium chloride infusion ( Intravenous New Bag/Given 09/21/20 1534)  sodium chloride 0.9 % bolus 1,000 mL (0 mLs Intravenous Stopped 09/21/20 1929)  metoCLOPramide (REGLAN) injection 10 mg (10 mg Intravenous Given 09/21/20 1537)  diphenhydrAMINE (BENADRYL) injection 25 mg (25 mg Intravenous Given 09/21/20 1539)  dexamethasone (DECADRON) injection 10 mg (10 mg Intravenous Given 09/21/20 1541)  ketorolac (TORADOL) 30 MG/ML injection 30 mg (30 mg Intravenous Given 09/21/20 1916)  morphine 2 MG/ML injection 2 mg (2 mg Intravenous Given 09/21/20 1912)    ED Course  I have reviewed the triage vital signs and the nursing notes.  Pertinent labs & imaging results that were available during my care of the patient were reviewed by me and considered in my medical decision making (see chart for details).    MDM Rules/Calculators/A&P                          Work-up without any significant findings to include CT head.  Patient originally given migraine cocktail 1 L fluid Reglan Decadron Benadryl.  With some improvement in blood pressure which was elevated patient does have a history of hypertension.  And did take her morning meds.  Does take Coreg gets bedtime.  Blood pressure did come down today systolics to around [id number].  But then the pain came back and blood pressure went up again.  Patient then given Toradol and morphine.  Was significant improvement in the headache.  Blood pressure was improved with pressures around 1 50-1 40.  Systolic  Stable at this time for discharge home.  Patient will rest in the dark room.  Follow-up with her doctors.  For blood pressure recheck.  Return for any new or worse symptoms.  CT significant for no evidence of a head bleed.  Clinically do not think that there was a stroke based on neuro exam.  Think this is a migraine-like headache.  And feel that the pain was resulted in higher  blood pressures. Final Clinical Impression(s) / ED Diagnoses Final diagnoses:  Headache disorder  Primary hypertension    Rx / DC Orders ED Discharge Orders    None       Fredia Sorrow, MD 09/21/20 2000

## 2020-09-21 NOTE — ED Provider Notes (Signed)
Emergency Medicine Provider Triage Evaluation Note  [name] , a 50 y.o. female  was evaluated in triage.  Pt complains of frontal headache onset Monday, intermittent, gradual in onset.  Last similar headache was a few months ago, no new or different symptoms from prior headaches.  Improved with Tylenol, returns 3 to 4 hours later.  States she has an astigmatism and needs glasses but is unable to pick them up.  Review of Systems  Positive: Headache, dizziness Negative: Vomiting, changes in speech/gait  Physical Exam  BP (!) 145/112 (BP Location: Right Arm)   Pulse 81   Temp 97.9 F (36.6 C) (Oral)   Resp 18   SpO2 100%  Gen:   Awake, no distress   Resp:  Normal effort  MSK:   Moves extremities without difficulty  Other:  No facial asymmetry   Medical Decision Making  Medically screening exam initiated at 11:37 AM.  Appropriate orders placed.  [name] was informed that the remainder of the evaluation will be completed by another provider, this initial triage assessment does not replace that evaluation, and the importance of remaining in the ED until their evaluation is complete.     Tacy Learn, PA-C 09/21/20 1137    Fredia Sorrow, MD 09/21/20 [phone]

## 2020-09-21 NOTE — Telephone Encounter (Signed)
Pt is currently at the hospital

## 2020-09-21 NOTE — Telephone Encounter (Signed)
Pt called with complaints of stabbing headaches since 09/17/20; she has been taking Tylenol x 2 but the headache comes back immediately; the pt says her pain is in her temples; she does have a history of headaches; her BP was 137/100 and cbg 100 at 0400 09/21/20; the pt says she has a procedure scheduled for today at 1445 and she is going to the ED after that; recommendations made per nurse triage protocol; she verbalized understanding and will go to the ED now; the pt is seen by Dr [name], [facility]; will route to office for notification.  Reason for Disposition . [1] SEVERE headache (e.g., excruciating) AND [2] "worst headache" of life  Answer Assessment - Initial Assessment Questions 1. LOCATION: "Where does it hurt?"      temples 2. ONSET: "When did the headache start?" (Minutes, hours or days)     09/17/20 3. PATTERN: "Does the pain come and go, or has it been constant since it started?"     constant 4. SEVERITY: "How bad is the pain?" and "What does it keep you from doing?"  (e.g., Scale 1-10; mild, moderate, or severe)   - MILD (1-3): doesn't interfere with normal activities    - MODERATE (4-7): interferes with normal activities or awakens from sleep    - SEVERE (8-10): excruciating pain, unable to do any normal activities        9 put of 10 5. RECURRENT SYMPTOM: "Have you ever had headaches before?" If Yes, ask: "When was the last time?" and "What happened that time?"      Pt has a history of headaches 6. CAUSE: "What do you think is causing the headache?"     Not sure 7. MIGRAINE: "Have you been diagnosed with migraine headaches?" If Yes, ask: "Is this headache similar?"    Yes, Previous headaches not as painful 8. HEAD INJURY: "Has there been any recent injury to the head?"      no 9. OTHER SYMPTOMS: "Do you have any other symptoms?" (fever, stiff neck, eye pain, sore throat, cold symptoms)    nausea 10. PREGNANCY: "Is there any chance you are pregnant?"  "When was your last menstrual period?"       no  Protocols used: HEADACHE-A-AH

## 2020-09-22 ENCOUNTER — Telehealth: Payer: Self-pay

## 2020-09-22 NOTE — Telephone Encounter (Signed)
Transition Care Management Follow-up Telephone Call  Date of discharge and from where: 09/21/2020 from [facility]  How have you been since you were released from the hospital? Pt stated that she is feeling much better today. Pt has an appt in July to see her PCP, as well as Social Work appt next week.   Any questions or concerns? No  Items Reviewed:  Did the pt receive and understand the discharge instructions provided? Yes   Medications obtained and verified? Yes   Other? No   Any new allergies since your discharge? No   Dietary orders reviewed? n/a  Do you have support at home? No   Functional Questionnaire: (I = Independent and D = Dependent) ADLs: I  Follow up appointments reviewed:   PCP Hospital f/u appt confirmed? No    Specialist Hospital f/u appt confirmed? No    Are transportation arrangements needed? No   If their condition worsens, is the pt aware to call PCP or go to the Emergency Dept.? Yes  Was the patient provided with contact information for the PCP's office or ED? Yes  Was to pt encouraged to call back with questions or concerns? Yes

## 2020-09-26 ENCOUNTER — Other Ambulatory Visit: Payer: Self-pay | Admitting: Obstetrics and Gynecology

## 2020-09-26 NOTE — Patient Outreach (Signed)
Care Coordination  09/26/2020  [name] 11-28-1970 [id number]    Medicaid Managed Care   Unsuccessful Outreach Note  09/26/2020 Name: [name] MRN: [id number] DOB: 1970/11/17  Referred by: Ladell Pier, MD Reason for referral : High Risk Managed Medicaid (Unsuccessful telephone outreach)   An unsuccessful telephone outreach was attempted today. The patient was referred to the case management team for assistance with care management and care coordination.   Follow Up Plan: A member of the Managed Medicaid  care management team will reach out to the patient again over the next 7 days.   [name], BSN [place]  [facility] - Managed Medicaid High Risk [phone].

## 2020-09-26 NOTE — Patient Instructions (Signed)
Hi [name], sorry we missed you today.  - as a part of your Medicaid benefit, you are eligible for care management and care coordination services at no cost or copay. I was unable to reach you by phone today but would be happy to help you with your health related needs. Please feel free to call me at [phone].  A member of the Managed Medicaid care management team will reach out to you again over the next 7 days.  [name], BSN [place]  [facility] - Managed Medicaid High Risk [phone].

## 2020-10-05 ENCOUNTER — Other Ambulatory Visit: Payer: Self-pay | Admitting: Internal Medicine

## 2020-10-05 DIAGNOSIS — F411 Generalized anxiety disorder: Secondary | ICD-10-CM

## 2020-10-05 DIAGNOSIS — Z8709 Personal history of other diseases of the respiratory system: Secondary | ICD-10-CM

## 2020-10-05 DIAGNOSIS — Z86718 Personal history of other venous thrombosis and embolism: Secondary | ICD-10-CM

## 2020-10-05 DIAGNOSIS — I5032 Chronic diastolic (congestive) heart failure: Secondary | ICD-10-CM

## 2020-10-05 DIAGNOSIS — I10 Essential (primary) hypertension: Secondary | ICD-10-CM

## 2020-10-05 DIAGNOSIS — G8929 Other chronic pain: Secondary | ICD-10-CM

## 2020-10-05 MED ORDER — METHOCARBAMOL 500 MG PO TABS: ORAL_TABLET | ORAL | 0 refills | Status: DC

## 2020-10-05 MED ORDER — CARVEDILOL 25 MG PO TABS: 25.0000 mg | ORAL_TABLET | Freq: Two times a day (BID) | ORAL | 0 refills | Status: DC

## 2020-10-05 MED ORDER — ELIQUIS 5 MG PO TABS: 5.0000 mg | ORAL_TABLET | Freq: Two times a day (BID) | ORAL | 0 refills | Status: DC

## 2020-10-05 MED ORDER — PAROXETINE HCL 10 MG PO TABS: 10.0000 mg | ORAL_TABLET | Freq: Every day | ORAL | 0 refills | Status: DC

## 2020-10-05 MED ORDER — SPIRONOLACTONE 25 MG PO TABS: 25.0000 mg | ORAL_TABLET | Freq: Every day | ORAL | 0 refills | Status: DC

## 2020-10-05 MED ORDER — AMLODIPINE BESYLATE 5 MG PO TABS: 2.5000 mg | ORAL_TABLET | Freq: Every day | ORAL | 0 refills | Status: DC

## 2020-10-05 MED ORDER — ALBUTEROL SULFATE HFA 108 (90 BASE) MCG/ACT IN AERS: INHALATION_SPRAY | RESPIRATORY_TRACT | 0 refills | Status: DC

## 2020-10-05 NOTE — Telephone Encounter (Signed)
Notes to clinic: this refill cannot be delegated     Requested Prescriptions  Pending Prescriptions Disp Refills   methocarbamol (ROBAXIN) 500 MG tablet 30 tablet 0      Not Delegated - Analgesics:  Muscle Relaxants Failed - 10/05/2020  9:38 AM      Failed - This refill cannot be delegated      Passed - Valid encounter within last 6 months    Recent Outpatient Visits           3 months ago Chronic midline thoracic back pain   [place] Ladell Pier, MD   6 months ago Establishing care with new doctor, encounter for   [place], MD       Future Appointments             In 1 month Ladell Pier, MD [place]              Signed Prescriptions Disp Refills   spironolactone (ALDACTONE) 25 MG tablet 30 tablet 0    Sig: Take 1 tablet (25 mg total) by mouth daily.      Cardiovascular: Diuretics - Aldosterone Antagonist Failed - 10/05/2020  9:38 AM      Failed - Last BP in normal range    BP Readings from Last 1 Encounters:  09/21/20 (!) 144/96          Passed - Cr in normal range and within 360 days    Creatinine  Date Value Ref Range Status  08/23/2020 0.78 0.44 - 1.00 mg/dL Final   Creatinine, Ser  Date Value Ref Range Status  09/21/2020 0.76 0.44 - 1.00 mg/dL Final          Passed - K in normal range and within 360 days    Potassium  Date Value Ref Range Status  09/21/2020 4.6 3.5 - 5.1 mmol/L Final          Passed - Na in normal range and within 360 days    Sodium  Date Value Ref Range Status  09/21/2020 138 135 - 145 mmol/L Final          Passed - Valid encounter within last 6 months    Recent Outpatient Visits           3 months ago Chronic midline thoracic back pain   [place] Ladell Pier, MD   6 months ago Establishing care with new doctor, encounter for   [place], MD       Future Appointments             In 1 month Ladell Pier, MD [place]               apixaban (ELIQUIS) 5 MG TABS tablet 60 tablet 0    Sig: Take 1 tablet (5 mg total) by mouth 2 (two) times daily.      Hematology:  Anticoagulants Failed - 10/05/2020  9:38 AM      Failed - HGB in normal range and within 360 days    Hemoglobin  Date Value Ref Range Status  09/21/2020 11.4 (L) 12.0 - 15.0 g/dL Final  08/23/2020 10.5 (L) 12.0 - 15.0 g/dL Final          Passed - PLT in normal range and within 360  days    Platelets  Date Value Ref Range Status  09/21/2020 150 150 - 400 K/uL Final   Platelet Count  Date Value Ref Range Status  08/23/2020 146 (L) 150 - 400 K/uL Final          Passed - HCT in normal range and within 360 days    HCT  Date Value Ref Range Status  09/21/2020 37.8 36.0 - 46.0 % Final          Passed - Cr in normal range and within 360 days    Creatinine  Date Value Ref Range Status  08/23/2020 0.78 0.44 - 1.00 mg/dL Final   Creatinine, Ser  Date Value Ref Range Status  09/21/2020 0.76 0.44 - 1.00 mg/dL Final          Passed - Valid encounter within last 12 months    Recent Outpatient Visits           3 months ago Chronic midline thoracic back pain   [place] Ladell Pier, MD   6 months ago Establishing care with new doctor, encounter for   [place], MD       Future Appointments             In 1 month Ladell Pier, MD [place]               PARoxetine (PAXIL) 10 MG tablet 30 tablet 0    Sig: Take 1 tablet (10 mg total) by mouth daily.      Psychiatry:  Antidepressants - SSRI Passed - 10/05/2020  9:38 AM      Passed - Valid encounter within last 6 months    Recent Outpatient Visits           3 months ago Chronic  midline thoracic back pain   [place] Ladell Pier, MD   6 months ago Establishing care with new doctor, encounter for   [place], MD       Future Appointments             In 1 month Ladell Pier, MD [place]               amLODipine (NORVASC) 5 MG tablet 15 tablet 0    Sig: Take 0.5 tablets (2.5 mg total) by mouth daily.      Cardiovascular:  Calcium Channel Blockers Failed - 10/05/2020  9:38 AM      Failed - Last BP in normal range    BP Readings from Last 1 Encounters:  09/21/20 (!) 144/96          Passed - Valid encounter within last 6 months    Recent Outpatient Visits           3 months ago Chronic midline thoracic back pain   [place], MD   6 months ago Establishing care with new doctor, encounter for   [place], MD       Future Appointments             In 1 month [name] Dalbert Batman, MD [place]               carvedilol ([place])  25 MG tablet 60 tablet 0    Sig: Take 1 tablet (25 mg total) by mouth 2 (two) times daily with a meal.      Cardiovascular:  Beta Blockers Failed - 10/05/2020  9:38 AM      Failed - Last BP in normal range    BP Readings from Last 1 Encounters:  09/21/20 (!) 144/96          Passed - Last Heart Rate in normal range    Pulse Readings from Last 1 Encounters:  09/21/20 91          Passed - Valid encounter within last 6 months    Recent Outpatient Visits           3 months ago Chronic midline thoracic back pain   [place] Ladell Pier, MD   6 months ago Establishing care with new doctor, encounter for   [place], MD       Future Appointments             In 1  month Ladell Pier, MD [place]               albuterol [facility] HFA) 108 (90 Base) MCG/ACT inhaler 8.5 g 0    Sig: INHALE TWO PUFFS BY MOUTH EVERY 6 HOURS AS NEEDED FOR WHEEZING OR SHORTNESS OF BREATH      Pulmonology:  Beta Agonists Failed - 10/05/2020  9:38 AM      Failed - One inhaler should last at least one month. If the patient is requesting refills earlier, contact the patient to check for uncontrolled symptoms.      Passed - Valid encounter within last 12 months    Recent Outpatient Visits           3 months ago Chronic midline thoracic back pain   [place], MD   6 months ago Establishing care with new doctor, encounter for   [place], MD       Future Appointments             In 1 month [name] Dalbert Batman, MD [place]

## 2020-10-05 NOTE — Telephone Encounter (Signed)
Medication Refill - Medication:  methocarbamol (ROBAXIN) 500 MG tablet  spironolactone (ALDACTONE) 25 MG tablet  apixaban (ELIQUIS) 5 MG TABS tablet  PARoxetine (PAXIL) 10 MG tablet  amLODipine (NORVASC) 5 MG tablet  carvedilol (COREG) 25 MG tablet  PROAIR HFA 108 (90 Base) MCG/ACT inhaler  Has the patient contacted their pharmacy? Yes.   Pharmacy told pt to contact PCP, due to now refills on file  Preferred Pharmacy (with phone number or street name): [place] ([place]), [place] - [address]  Phone:  [phone] Fax:  [phone]  Agent: Please be advised that RX refills may take up to 3 business days. We ask that you follow-up with your pharmacy.

## 2020-10-09 ENCOUNTER — Other Ambulatory Visit: Payer: Self-pay | Admitting: Internal Medicine

## 2020-10-09 DIAGNOSIS — F411 Generalized anxiety disorder: Secondary | ICD-10-CM

## 2020-10-09 DIAGNOSIS — I5032 Chronic diastolic (congestive) heart failure: Secondary | ICD-10-CM

## 2020-10-09 DIAGNOSIS — I2510 Atherosclerotic heart disease of native coronary artery without angina pectoris: Secondary | ICD-10-CM

## 2020-10-09 DIAGNOSIS — I10 Essential (primary) hypertension: Secondary | ICD-10-CM

## 2020-10-09 NOTE — Telephone Encounter (Signed)
Requested medications are due for refill today yes  Requested medications are on the active medication list yes  Last refill 09/06/20  Last visit 04/07/20  Future visit scheduled 11/07/19  Notes to clinic FYI had 5 labs ordered with Dec visit but did not have them drawn, therefore failed protocol of lab work within 360 days. Please assess.

## 2020-10-11 ENCOUNTER — Other Ambulatory Visit: Payer: Self-pay | Admitting: Internal Medicine

## 2020-10-11 DIAGNOSIS — I5032 Chronic diastolic (congestive) heart failure: Secondary | ICD-10-CM

## 2020-10-11 DIAGNOSIS — F411 Generalized anxiety disorder: Secondary | ICD-10-CM

## 2020-10-11 DIAGNOSIS — I10 Essential (primary) hypertension: Secondary | ICD-10-CM

## 2020-10-16 ENCOUNTER — Other Ambulatory Visit: Payer: Self-pay | Admitting: Obstetrics and Gynecology

## 2020-10-16 NOTE — Patient Instructions (Signed)
Hi Ms. Kamphaus, sorry we missed you today  - as a part of your Medicaid benefit, you are eligible for care management and care coordination services at no cost or copay. I was unable to reach you by phone today but would be happy to help you with your health related needs. Please feel free to call me at [phone].  A member of the Managed Medicaid care management team will reach out to you again over the next 7-14 days.   [name], BSN [place]  [facility] - Managed Medicaid High Risk [phone]

## 2020-10-16 NOTE — Patient Outreach (Signed)
Care Coordination  10/16/2020  [name] [birth year] [id number]   Medicaid Managed Care   Unsuccessful Outreach Note  10/16/2020 Name: [name] MRN: [id number] DOB: December 06, 1970  Referred by: Ladell Pier, MD Reason for referral : High Risk Managed Medicaid (Unsuccessful telephone outreach)   An unsuccessful telephone outreach was attempted today. The patient was referred to the case management team for assistance with care management and care coordination.   Follow Up Plan: A member of the Managed Medicaid care management team will reach out to the patient again over the next 7-14 days.   [name], BSN [place]  [facility] - Managed Medicaid High Risk [phone]

## 2020-10-20 ENCOUNTER — Encounter (HOSPITAL_BASED_OUTPATIENT_CLINIC_OR_DEPARTMENT_OTHER): Payer: Medicaid Other | Attending: Internal Medicine | Admitting: Internal Medicine

## 2020-10-20 ENCOUNTER — Other Ambulatory Visit: Payer: Self-pay

## 2020-10-20 ENCOUNTER — Other Ambulatory Visit (HOSPITAL_COMMUNITY): Payer: Self-pay | Admitting: Student

## 2020-10-20 DIAGNOSIS — Z885 Allergy status to narcotic agent status: Secondary | ICD-10-CM | POA: Diagnosis not present

## 2020-10-20 DIAGNOSIS — L97829 Non-pressure chronic ulcer of other part of left lower leg with unspecified severity: Secondary | ICD-10-CM | POA: Diagnosis not present

## 2020-10-20 DIAGNOSIS — Z881 Allergy status to other antibiotic agents status: Secondary | ICD-10-CM | POA: Insufficient documentation

## 2020-10-20 DIAGNOSIS — I890 Lymphedema, not elsewhere classified: Secondary | ICD-10-CM | POA: Diagnosis not present

## 2020-10-20 DIAGNOSIS — Z86718 Personal history of other venous thrombosis and embolism: Secondary | ICD-10-CM

## 2020-10-20 DIAGNOSIS — Z87891 Personal history of nicotine dependence: Secondary | ICD-10-CM | POA: Diagnosis not present

## 2020-10-20 DIAGNOSIS — I5032 Chronic diastolic (congestive) heart failure: Secondary | ICD-10-CM | POA: Diagnosis not present

## 2020-10-20 DIAGNOSIS — I872 Venous insufficiency (chronic) (peripheral): Secondary | ICD-10-CM | POA: Diagnosis not present

## 2020-10-20 DIAGNOSIS — I110 Hypertensive heart disease with heart failure: Secondary | ICD-10-CM | POA: Insufficient documentation

## 2020-10-20 MED ORDER — CONTRAST ALLERGY PREMED PACK 3 X 50 MG & 1 X 50 MG PO KIT: PACK | ORAL | 0 refills | Status: DC

## 2020-10-20 NOTE — Progress Notes (Signed)
WANETA, BOSSERT ([id number]) Visit Report for 10/20/2020 Abuse/Suicide Risk Screen Details Patient Name: Date of Service: [name], [name] 10/20/2020 7:30 A M Medical Record Number: [id number] Patient Account Number: [id number] Date of Birth/Sex: Treating RN: 1970-05-10 (50 y.o. [name], [name] Primary Care [name] Other Clinician: Referring Rein Popov: Treating Mekhai Venuto/Extender: Sammuel Bailiff in Treatment: 0 Abuse/Suicide Risk Screen Items Answer ABUSE RISK SCREEN: Has anyone close to you tried to hurt or harm you recentlyo No Do you feel uncomfortable with anyone in your familyo No Has anyone forced you do things that you didnt want to doo No Electronic Signature(s) Signed: 10/20/2020 6:18:32 PM By: Rhae Hammock RN Entered By: Rhae Hammock on 10/20/2020 08:04:58 -------------------------------------------------------------------------------- Activities of Daily Living Details Patient Name: Date of Service: [name], [name] 10/20/2020 7:30 A M Medical Record Number: [id number] Patient Account Number: [id number] Date of Birth/Sex: Treating RN: 11-24-1970 (50 y.o. [name], [name] Primary Care Illya Gienger: [name] Other Clinician: Referring Verlia Kaney: Treating Jhace Fennell/Extender: Sammuel Bailiff in Treatment: 0 Activities of Daily Living Items Answer Activities of Daily Living (Please select one for each item) Drive Automobile Completely Able T Medications ake Completely Able Use T elephone Completely Able Care for Appearance Completely Able Use T oilet Completely Able Bath / Shower Completely Able Dress Self Completely Able Feed Self Completely Able Walk Completely Able Get In / Out Bed Completely Able Housework Completely Able Prepare Meals Completely [place] for Self Completely Able Electronic Signature(s) Signed: 10/20/2020 6:18:32 PM By:  Rhae Hammock RN Entered By: Rhae Hammock on 10/20/2020 08:05:15 -------------------------------------------------------------------------------- Education Screening Details Patient Name: Date of Service: [name] 10/20/2020 7:30 A M Medical Record Number: [id number] Patient Account Number: [id number] Date of Birth/Sex: Treating RN: 12-02-1970 (50 y.o. [name], [name] Primary Care Khalise Billard: [name] Other Clinician: Referring Jakeya Gherardi: Treating Dhriti Fales/Extender: Sammuel Bailiff in Treatment: 0 Primary Learner Assessed: Patient Learning Preferences/Education Level/Primary Language Learning Preference: Explanation, Demonstration, Communication Board, Printed Material Highest Education Level: High School Preferred Language: English Cognitive Barrier Language Barrier: No Translator Needed: No Memory Deficit: No Emotional Barrier: No Cultural/Religious Beliefs Affecting Medical Care: No Physical Barrier Impaired Vision: No Impaired Hearing: No Decreased Hand dexterity: No Knowledge/Comprehension Knowledge Level: High Comprehension Level: High Ability to understand written instructions: High Ability to understand verbal instructions: High Motivation Anxiety Level: Calm Cooperation: Cooperative Education Importance: Denies Need Interest in Health Problems: Asks Questions Perception: Coherent Willingness to Engage in Self-Management High Activities: Readiness to Engage in Self-Management High Activities: Electronic Signature(s) Signed: 10/20/2020 6:18:32 PM By: Rhae Hammock RN Entered By: Rhae Hammock on 10/20/2020 08:05:42 -------------------------------------------------------------------------------- Fall Risk Assessment Details Patient Name: Date of Service: [name] 10/20/2020 7:30 A M Medical Record Number: [id number] Patient Account Number: [id number] Date of Birth/Sex: Treating  RN: [birth year] (50 y.o. [name], [name] Primary Care Rosmarie Esquibel: [name] Other Clinician: Referring Hillary Struss: Treating Fama Muenchow/Extender: Sammuel Bailiff in Treatment: 0 Fall Risk Assessment Items Have you had 2 or more falls in the last 12 monthso 0 No Have you had any fall that resulted in injury in the last 12 monthso 0 No FALLS RISK SCREEN History of falling - immediate or within 3 months 0 No Secondary diagnosis (Do you have 2 or more medical diagnoseso) 0 No Ambulatory aid None/bed rest/wheelchair/nurse 0 No Crutches/cane/walker 0 No Furniture 0 No Intravenous therapy Access/Saline/Heparin Lock 0 No Gait/Transferring Normal/ bed rest/  wheelchair 0 No Weak (short steps with or without shuffle, stooped but able to lift head while walking, may seek 0 No support from furniture) Impaired (short steps with shuffle, may have difficulty arising from chair, head down, impaired 0 No balance) Mental Status Oriented to own ability 0 No Electronic Signature(s) Signed: 10/20/2020 6:18:32 PM By: Rhae Hammock RN Entered By: Rhae Hammock on 10/20/2020 08:05:48 -------------------------------------------------------------------------------- Foot Assessment Details Patient Name: Date of Service: [name] 10/20/2020 7:30 A M Medical Record Number: [id number] Patient Account Number: [id number] Date of Birth/Sex: Treating RN: Mar 03, 1971 (50 y.o. [name], [name] Primary Care Dewey Viens: [name] Other Clinician: Referring Rainn Bullinger: Treating Aissa Lisowski/Extender: Sammuel Bailiff in Treatment: 0 Foot Assessment Items Site Locations + = Sensation present, - = Sensation absent, C = Callus, U = Ulcer R = Redness, W = Warmth, M = Maceration, PU = Pre-ulcerative lesion F = Fissure, S = Swelling, D = Dryness Assessment Right: Left: Other Deformity: No No Prior Foot Ulcer: No No Prior Amputation: No  No Charcot Joint: No No Ambulatory Status: Ambulatory Without Help Gait: Electronic Signature(s) Signed: 10/20/2020 6:18:32 PM By: Rhae Hammock RN Entered By: Rhae Hammock on 10/20/2020 08:06:22 -------------------------------------------------------------------------------- Nutrition Risk Screening Details Patient Name: Date of Service: [name] 10/20/2020 7:30 A M Medical Record Number: [id number] Patient Account Number: [id number] Date of Birth/Sex: Treating RN: [birth year] (50 y.o. [name], [name] Primary Care Tinya Cadogan: [name] Other Clinician: Referring Paislei Dorval: Treating Betzabeth Derringer/Extender: Sammuel Bailiff in Treatment: 0 Height (in): 67 Weight (lbs): 346 Body Mass Index (BMI): 54.2 Nutrition Risk Screening Items Score Screening NUTRITION RISK SCREEN: I have an illness or condition that made me change the kind and/or amount of food I eat 0 No I eat fewer than two meals per day 0 No I eat few fruits and vegetables, or milk products 0 No I have three or more drinks of beer, liquor or wine almost every day 0 No I have tooth or mouth problems that make it hard for me to eat 0 No I don't always have enough money to buy the food I need 0 No I eat alone most of the time 0 No I take three or more different prescribed or over-the-counter drugs a day 0 No Without wanting to, I have lost or gained 10 pounds in the last six months 0 No I am not always physically able to shop, cook and/or feed myself 0 No Nutrition Protocols Good Risk Protocol 0 No interventions needed Moderate Risk Protocol High Risk Proctocol Risk Level: Good Risk Score: 0 Electronic Signature(s) Signed: 10/20/2020 6:18:32 PM By: Rhae Hammock RN Entered By: Rhae Hammock on 10/20/2020 08:05:55

## 2020-10-20 NOTE — Progress Notes (Signed)
ALAYAH, KITTEL ([id number]) Visit Report for 10/20/2020 Chief Complaint Document Details Patient Name: Date of Service: [name], [name] 10/20/2020 7:30 A M Medical Record Number: [id number] Patient Account Number: [id number] Date of Birth/Sex: Treating RN: May 30, 1970 (50 y.o. [name] Primary Care Provider: [name] Other Clinician: Referring Provider: Treating Provider/Extender: Sammuel Bailiff in Treatment: 0 Information Obtained from: Patient Chief Complaint Left lower extremity wound Electronic Signature(s) Signed: 10/20/2020 9:19:14 AM By: [name] DO Entered By: [name] on 10/20/2020 09:13:25 -------------------------------------------------------------------------------- HPI Details Patient Name: Date of Service: [name], [name] 10/20/2020 7:30 A M Medical Record Number: [id number] Patient Account Number: [id number] Date of Birth/Sex: Treating RN: 01/01/1971 (50 y.o. [name] Primary Care Provider: [name] Other Clinician: Referring Provider: Treating Provider/Extender: Sammuel Bailiff in Treatment: 0 History of Present Illness HPI Description: Admission 6/24 [name] is a 50 year old female with a past medical history of chronic venous insufficiency, lymphedema, DVT on anticoagulation and diastolic heart failure that presents to the clinic for left lower extremity wound. She was last seen 4 months ago in our clinic for the same issue. The reoccurring wound started at the end of May and has been going on for 1 month. She has been using an ointment and I am unclear what this is. She tries to keep her leg elevated with her compression stocking. She also reports she has lymphedema pumps and has been using them as well. She reports mild pain to the area. She denies signs of infection including increased warmth, erythema or purulent drainage. Electronic  Signature(s) Signed: 10/20/2020 9:19:14 AM By: [name] DO Entered By: [name] on 10/20/2020 09:16:22 -------------------------------------------------------------------------------- Physical Exam Details Patient Name: Date of Service: Megan Session D. 10/20/2020 7:30 A M Medical Record Number: [id number] Patient Account Number: [id number] Date of Birth/Sex: Treating RN: 05/25/1970 (50 y.o. [name] Primary Care Provider: [name] Other Clinician: Referring Provider: Treating Provider/Extender: Sammuel Bailiff in Treatment: 0 Constitutional respirations regular, non-labored and within target range for patient.. Cardiovascular 2+ dorsalis pedis/posterior tibialis pulses. Psychiatric pleasant and cooperative. Notes Left lower extremity: On the medial aspect there is an open wound with healthy granulation tissue present. No signs of infection. 3+ pitting edema to the knee. Electronic Signature(s) Signed: 10/20/2020 9:19:14 AM By: [name] DO Entered By: [name] on 10/20/2020 09:16:28 -------------------------------------------------------------------------------- Physician Orders Details Patient Name: Date of Service: Megan Session D. 10/20/2020 7:30 A M Medical Record Number: [id number] Patient Account Number: [id number] Date of Birth/Sex: Treating RN: 05-07-1970 (50 y.o. [name], [name] Primary Care Provider: [name] Other Clinician: Referring Provider: Treating Provider/Extender: Sammuel Bailiff in Treatment: 0 Verbal / Phone Orders: No Diagnosis Coding ICD-10 Coding Code Description [phone] Non-pressure chronic ulcer of other part of left lower leg with unspecified severity I87.2 Venous insufficiency (chronic) (peripheral) I89.0 Lymphedema, not elsewhere classified Z86.718 Personal history of other venous thrombosis and embolism Follow-up  Appointments ppointment in 1 week. - Dr. Heber [place] Return A Other: - Be sure you go to your follow up appt. with Vascular on 07/11 Bathing/ Shower/ Hygiene May shower with protection but do not get wound dressing(s) wet. - use cast protector to keep wrap dry in the shower Edema Control - Lymphedema / SCD / Other Bilateral Lower Extremities Lymphedema Pumps. Use Lymphedema pumps on leg(s) 2-3 times a day for 45-60 minutes. If wearing any wraps or hose, do not  remove them. Continue exercising as instructed. Elevate legs to the level of the heart or above for 30 minutes daily and/or when sitting, a frequency of: Avoid standing for long periods of time. Patient to wear own compression stockings every day. - right leg daily Exercise regularly Wound Treatment Wound #4 - Lower Leg Wound Laterality: Left, Medial Cleanser: Soap and Water 1 x Per Week/15 Days Discharge Instructions: May shower and wash wound with dial antibacterial soap and water prior to dressing change. Prim Dressing: KerraCel Ag Gelling Fiber Dressing, 4x5 in (silver alginate) 1 x Per Week/15 Days ary Discharge Instructions: Apply silver alginate to wound bed as instructed Secondary Dressing: Woven Gauze Sponge, Non-Sterile 4x4 in 1 x Per Week/15 Days Discharge Instructions: Apply over primary dressing as directed. Compression Wrap: FourPress (4 layer compression wrap) 1 x Per Week/15 Days Discharge Instructions: Apply four layer compression as directed. May also use Miliken CoFlex 2 layer compression system as alternative. Electronic Signature(s) Signed: 10/20/2020 9:19:14 AM By: [name] DO Entered By: [name] on 10/20/2020 09:16:41 -------------------------------------------------------------------------------- Problem List Details Patient Name: Date of Service: Megan Session D. 10/20/2020 7:30 A M Medical Record Number: [id number] Patient Account Number: [id number] Date of Birth/Sex: Treating  RN: 07-08-1970 (50 y.o. [name] Primary Care Provider: [name] Other Clinician: Referring Provider: Treating Provider/Extender: Sammuel Bailiff in Treatment: 0 Active Problems ICD-10 Encounter Code Description Active Date MDM Diagnosis L97.829 Non-pressure chronic ulcer of other part of left lower leg with unspecified 10/20/2020 No Yes severity I87.2 Venous insufficiency (chronic) (peripheral) 10/20/2020 No Yes I89.0 Lymphedema, not elsewhere classified 10/20/2020 No Yes Z86.718 Personal history of other venous thrombosis and embolism 10/20/2020 No Yes Inactive Problems Resolved Problems Electronic Signature(s) Signed: 10/20/2020 9:19:14 AM By: [name] DO Entered By: [name] on 10/20/2020 09:09:50 -------------------------------------------------------------------------------- Progress Note Details Patient Name: Date of Service: Megan Session D. 10/20/2020 7:30 A M Medical Record Number: [id number] Patient Account Number: [id number] Date of Birth/Sex: Treating RN: 08/19/1970 (49 y.o. [name] Primary Care Provider: [name] Other Clinician: Referring Provider: Treating Provider/Extender: Sammuel Bailiff in Treatment: 0 Subjective Chief Complaint Information obtained from Patient Left lower extremity wound History of Present Illness (HPI) Admission 6/24 [name] is a 50 year old female with a past medical history of chronic venous insufficiency, lymphedema, DVT on anticoagulation and diastolic heart failure that presents to the clinic for left lower extremity wound. She was last seen 4 months ago in our clinic for the same issue. The reoccurring wound started at the end of May and has been going on for 1 month. She has been using an ointment and I am unclear what this is. She tries to keep her leg elevated with her compression stocking. She also reports she has  lymphedema pumps and has been using them as well. She reports mild pain to the area. She denies signs of infection including increased warmth, erythema or purulent drainage. Patient History Information obtained from Patient. Allergies ACE Inhibitors, hydromorphone, vancomycin, ferrous sulfate Family History Diabetes - Mother,Maternal Grandparents, Heart Disease - Paternal Grandparents, Hypertension - Mother,Maternal Grandparents, Lung Disease - Father,Paternal Grandparents, No family history of Cancer, Hereditary Spherocytosis, Kidney Disease, Seizures, Stroke, Thyroid Problems, Tuberculosis. Social History Former smoker, Marital Status - Single, Alcohol Use - Never, Drug Use - No History, Caffeine Use - Daily. Medical History Eyes Denies history of Cataracts, Glaucoma, Optic Neuritis Ear/Nose/Mouth/Throat Denies history of Chronic sinus problems/congestion, Middle ear problems Hematologic/Lymphatic Denies  history of Anemia, Hemophilia, Human Immunodeficiency Virus, Lymphedema, Sickle Cell Disease Respiratory Patient has history of Sleep Apnea Denies history of Aspiration, Asthma, Chronic Obstructive Pulmonary Disease (COPD), Pneumothorax, Tuberculosis Cardiovascular Patient has history of Congestive Heart Failure, Coronary Artery Disease, Hypertension Denies history of Angina, Arrhythmia, Deep Vein Thrombosis, Hypotension, Myocardial Infarction, Peripheral Arterial Disease, Peripheral Venous Disease, Phlebitis, Vasculitis Gastrointestinal Denies history of Cirrhosis , Colitis, Crohnoos, Hepatitis A, Hepatitis B, Hepatitis C Endocrine Denies history of Type I Diabetes, Type II Diabetes Genitourinary Denies history of End Stage Renal Disease Immunological Denies history of Lupus Erythematosus, Raynaudoos, Scleroderma Integumentary (Skin) Denies history of History of Burn Musculoskeletal Denies history of Gout, Rheumatoid Arthritis, Osteoarthritis,  Osteomyelitis Neurologic Denies history of Dementia, Neuropathy, Quadriplegia, Paraplegia, Seizure Disorder Oncologic Denies history of Received Chemotherapy, Received Radiation Psychiatric Denies history of Anorexia/bulimia, Confinement Anxiety Review of Systems (ROS) Constitutional Symptoms (General Health) Denies complaints or symptoms of Fatigue, Fever, Chills, Marked Weight Change. Eyes Denies complaints or symptoms of Dry Eyes, Vision Changes, Glasses / Contacts. Ear/Nose/Mouth/Throat Denies complaints or symptoms of Chronic sinus problems or rhinitis. Respiratory Denies complaints or symptoms of Chronic or frequent coughs, Shortness of Breath. Cardiovascular Denies complaints or symptoms of Chest pain. Gastrointestinal Denies complaints or symptoms of Frequent diarrhea, Nausea, Vomiting. Endocrine Denies complaints or symptoms of Heat/cold intolerance. Genitourinary Denies complaints or symptoms of Frequent urination. Integumentary (Skin) Denies complaints or symptoms of Wounds. Musculoskeletal Denies complaints or symptoms of Muscle Pain, Muscle Weakness. Neurologic Denies complaints or symptoms of Numbness/parasthesias. Psychiatric Denies complaints or symptoms of Claustrophobia, Suicidal. Objective Constitutional respirations regular, non-labored and within target range for patient.. Vitals Time Taken: 8:02 AM, Height: 67 in, Weight: 346 lbs, Source: Stated, BMI: 54.2, Temperature: 97.9 F, Pulse: 77 bpm, Respiratory Rate: 17 breaths/min, Blood Pressure: 142/78 mmHg. Cardiovascular 2+ dorsalis pedis/posterior tibialis pulses. Psychiatric pleasant and cooperative. General Notes: Left lower extremity: On the medial aspect there is an open wound with healthy granulation tissue present. No signs of infection. 3+ pitting edema to the knee. Integumentary (Hair, Skin) Wound #4 status is Open. Original cause of wound was Gradually Appeared. The date acquired was:  09/26/2020. The wound is located on the Left,Medial Lower Leg. The wound measures 5.6cm length x 3.7cm width x 0.1cm depth; 16.273cm^2 area and 1.627cm^3 volume. There is no tunneling or undermining noted. There is a medium amount of serosanguineous drainage noted. The wound margin is distinct with the outline attached to the wound base. There is large (67- 100%) red, pink granulation within the wound bed. There is no necrotic tissue within the wound bed. Assessment Active Problems ICD-10 Non-pressure chronic ulcer of other part of left lower leg with unspecified severity Venous insufficiency (chronic) (peripheral) Lymphedema, not elsewhere classified Personal history of other venous thrombosis and embolism Patient presents with a reoccurring chronically nonhealing wound to her left lower extremity that appears to be caused by venous and lymphedema. ABIs on the left were 1.17 There are no signs of infection on exam, in fact the wound appears clean and with healthy granulation tissue present. I think she would benefit from silver alginate and 4-layer compression.I will see her back in a week. Procedures Wound #4 Pre-procedure diagnosis of Wound #4 is a Venous Leg Ulcer located on the Left,Medial Lower Leg . There was a Four Layer Compression Therapy Procedure by Rhae Hammock, RN. Post procedure Diagnosis Wound #4: Same as Pre-Procedure Plan Follow-up Appointments: Return Appointment in 1 week. - Dr. Heber [place] Other: - Be sure you go to your  follow up appt. with Vascular on 07/11 Bathing/ Shower/ Hygiene: May shower with protection but do not get wound dressing(s) wet. - use cast protector to keep wrap dry in the shower Edema Control - Lymphedema / SCD / Other: Lymphedema Pumps. Use Lymphedema pumps on leg(s) 2-3 times a day for 45-60 minutes. If wearing any wraps or hose, do not remove them. Continue exercising as instructed. Elevate legs to the level of the heart or above for 30 minutes  daily and/or when sitting, a frequency of: Avoid standing for long periods of time. Patient to wear own compression stockings every day. - right leg daily Exercise regularly WOUND #4: - Lower Leg Wound Laterality: Left, Medial Cleanser: Soap and Water 1 x Per Week/15 Days Discharge Instructions: May shower and wash wound with dial antibacterial soap and water prior to dressing change. Prim Dressing: KerraCel Ag Gelling Fiber Dressing, 4x5 in (silver alginate) 1 x Per Week/15 Days ary Discharge Instructions: Apply silver alginate to wound bed as instructed Secondary Dressing: Woven Gauze Sponge, Non-Sterile 4x4 in 1 x Per Week/15 Days Discharge Instructions: Apply over primary dressing as directed. Compression Wrap: FourPress (4 layer compression wrap) 1 x Per Week/15 Days Discharge Instructions: Apply four layer compression as directed. May also use Miliken CoFlex 2 layer compression system as alternative. 1. Silver alginate 2. 4-layer compression 3. Follow-up in 1 week Electronic Signature(s) Signed: 10/20/2020 9:19:14 AM By: [name] DO Entered By: [name] on 10/20/2020 09:18:16 -------------------------------------------------------------------------------- HxROS Details Patient Name: Date of Service: [name], Dulse D. 10/20/2020 7:30 A M Medical Record Number: [id number] Patient Account Number: [id number] Date of Birth/Sex: Treating RN: 1971/04/04 (50 y.o. [name], [name] Primary Care Provider: [name] Other Clinician: Referring Provider: Treating Provider/Extender: Sammuel Bailiff in Treatment: 0 Information Obtained From Patient Constitutional Symptoms (General Health) Complaints and Symptoms: Negative for: Fatigue; Fever; Chills; Marked Weight Change Eyes Complaints and Symptoms: Negative for: Dry Eyes; Vision Changes; Glasses / Contacts Medical History: Negative for: Cataracts; Glaucoma; Optic  Neuritis Ear/Nose/Mouth/Throat Complaints and Symptoms: Negative for: Chronic sinus problems or rhinitis Medical History: Negative for: Chronic sinus problems/congestion; Middle ear problems Respiratory Complaints and Symptoms: Negative for: Chronic or frequent coughs; Shortness of Breath Medical History: Positive for: Sleep Apnea Negative for: Aspiration; Asthma; Chronic Obstructive Pulmonary Disease (COPD); Pneumothorax; Tuberculosis Cardiovascular Complaints and Symptoms: Negative for: Chest pain Medical History: Positive for: Congestive Heart Failure; Coronary Artery Disease; Hypertension Negative for: Angina; Arrhythmia; Deep Vein Thrombosis; Hypotension; Myocardial Infarction; Peripheral Arterial Disease; Peripheral Venous Disease; Phlebitis; Vasculitis Gastrointestinal Complaints and Symptoms: Negative for: Frequent diarrhea; Nausea; Vomiting Medical History: Negative for: Cirrhosis ; Colitis; Crohns; Hepatitis A; Hepatitis B; Hepatitis C Endocrine Complaints and Symptoms: Negative for: Heat/cold intolerance Medical History: Negative for: Type I Diabetes; Type II Diabetes Genitourinary Complaints and Symptoms: Negative for: Frequent urination Medical History: Negative for: End Stage Renal Disease Integumentary (Skin) Complaints and Symptoms: Negative for: Wounds Medical History: Negative for: History of Burn Musculoskeletal Complaints and Symptoms: Negative for: Muscle Pain; Muscle Weakness Medical History: Negative for: Gout; Rheumatoid Arthritis; Osteoarthritis; Osteomyelitis Neurologic Complaints and Symptoms: Negative for: Numbness/parasthesias Medical History: Negative for: Dementia; Neuropathy; Quadriplegia; Paraplegia; Seizure Disorder Psychiatric Complaints and Symptoms: Negative for: Claustrophobia; Suicidal Medical History: Negative for: Anorexia/bulimia; Confinement Anxiety Hematologic/Lymphatic Medical History: Negative for: Anemia;  Hemophilia; Human Immunodeficiency Virus; Lymphedema; Sickle Cell Disease Immunological Medical History: Negative for: Lupus Erythematosus; Raynauds; Scleroderma Oncologic Medical History: Negative for: Received Chemotherapy; Received Radiation Immunizations Pneumococcal Vaccine: Received Pneumococcal Vaccination: No  Implantable Devices None Family and Social History Cancer: No; Diabetes: Yes - Mother,Maternal Grandparents; Heart Disease: Yes - Paternal Grandparents; Hereditary Spherocytosis: No; Hypertension: Yes - Mother,Maternal Grandparents; Kidney Disease: No; Lung Disease: Yes - Father,Paternal Grandparents; Seizures: No; Stroke: No; Thyroid Problems: No; Tuberculosis: No; Former smoker; Marital Status - Single; Alcohol Use: Never; Drug Use: No History; Caffeine Use: Daily; Financial Concerns: No; Food, Clothing or Shelter Needs: No; Support System Lacking: No; Transportation Concerns: No Electronic Signature(s) Signed: 10/20/2020 9:19:14 AM By: [name] DO Signed: 10/20/2020 6:18:32 PM By: Rhae Hammock RN Entered By: Rhae Hammock on 10/20/2020 08:04:42 -------------------------------------------------------------------------------- SuperBill Details Patient Name: Date of Service: Megan Session D. 10/20/2020 Medical Record Number: [id number] Patient Account Number: [id number] Date of Birth/Sex: Treating RN: Mar 03, 1971 (50 y.o. [name], [name] Primary Care Provider: [name] Other Clinician: Referring Provider: Treating Provider/Extender: Sammuel Bailiff in Treatment: 0 Diagnosis Coding ICD-10 Codes Code Description [phone] Non-pressure chronic ulcer of other part of left lower leg with unspecified severity I87.2 Venous insufficiency (chronic) (peripheral) I89.0 Lymphedema, not elsewhere classified Z86.718 Personal history of other venous thrombosis and embolism Facility Procedures CPT4 Code: [id number] Description:  [id number] - WOUND CARE VISIT-LEV 3 EST PT Modifier: Quantity: 1 CPT4 Code: [id number] Description: (Facility Use Only) [id number] - [place] COMPRS LWR LT LEG Modifier: Quantity: 1 Physician Procedures : CPT4 Code Description Modifier [id number] - WC PHYS LEVEL 3 - EST PT ICD-10 Diagnosis Description L97.829 Non-pressure chronic ulcer of other part of left lower leg with unspecified severity I87.2 Venous insufficiency (chronic) (peripheral) I89.0  Lymphedema, not elsewhere classified Z86.718 Personal history of other venous thrombosis and embolism Quantity: 1 Electronic Signature(s) Signed: 10/20/2020 9:19:14 AM By: [name] DO Entered By: [name] on 10/20/2020 09:18:34

## 2020-10-20 NOTE — Progress Notes (Signed)
[name], [name] ([id number]) Visit Report for 10/20/2020 Allergy List Details Patient Name: Date of Service: [name], [name] 10/20/2020 7:30 A M Medical Record Number: [id number] Patient Account Number: [id number] Date of Birth/Sex: Treating RN: June 11, 1970 (50 y.o. [name] Primary Care Vienne Corcoran: [name] Other Clinician: Referring Savon Bordonaro: Treating Zarai Orsborn/Extender: Sammuel Bailiff in Treatment: 0 Allergies Active Allergies ACE Inhibitors hydromorphone vancomycin ferrous sulfate Allergy Notes Electronic Signature(s) Signed: 10/20/2020 6:18:32 PM By: Rhae Hammock RN Entered By: Rhae Hammock on 10/20/2020 08:04:04 -------------------------------------------------------------------------------- Arrival Information Details Patient Name: Date of Service: Megan Session D. 10/20/2020 7:30 A M Medical Record Number: [id number] Patient Account Number: [id number] Date of Birth/Sex: Treating RN: Apr 10, 1971 (50 y.o. [name] Primary Care Caresse Sedivy: [name] Other Clinician: Referring Giulia Hickey: Treating Ayan Yankey/Extender: Sammuel Bailiff in Treatment: 0 Visit Information Patient Arrived: Ambulatory Arrival Time: 08:02 Accompanied By: self Transfer Assistance: None Patient Identification Verified: Yes Secondary Verification Process Completed: Yes Patient Requires Transmission-Based Precautions: No Patient Has Alerts: Yes Patient Alerts: ABI's: 11/21 L:1.17 History Since Last Visit Added or deleted any medications: No Any new allergies or adverse reactions: No Had a fall or experienced change in activities of daily living that may affect risk of falls: No Signs or symptoms of abuse/neglect since last visito No Hospitalized since last visit: No Implantable device outside of the clinic excluding cellular tissue based products placed in the center since last visit: No Electronic  Signature(s) Signed: 10/20/2020 6:18:32 PM By: Rhae Hammock RN Entered By: Rhae Hammock on 10/20/2020 08:39:33 -------------------------------------------------------------------------------- Clinic Level of Care Assessment Details Patient Name: Date of Service: Megan Session D. 10/20/2020 7:30 A M Medical Record Number: [id number] Patient Account Number: [id number] Date of Birth/Sex: Treating RN: 05/29/1970 (50 y.o. [name] Primary Care Ryland Tungate: [name] Other Clinician: Referring Davanee Klinkner: Treating Tata Timmins/Extender: Sammuel Bailiff in Treatment: 0 Clinic Level of Care Assessment Items TOOL 3 Quantity Score X- 1 0 Use when EandM and Procedure is performed on FOLLOW-UP visit ASSESSMENTS - Nursing Assessment / Reassessment X- 1 10 Reassessment of Co-morbidities (includes updates in patient status) X- 1 5 Reassessment of Adherence to Treatment Plan ASSESSMENTS - Wound and Skin Assessment / Reassessment '[]'$  - Points for Wound Assessment can only be taken for a new wound of unknown or different etiology and a procedure is 0 NOT performed to that wound X- 1 5 Simple Wound Assessment / Reassessment - one wound '[]'$  - 0 Complex Wound Assessment / Reassessment - multiple wounds '[]'$  - 0 Dermatologic / Skin Assessment (not related to wound area) ASSESSMENTS - Focused Assessment X- 1 5 Circumferential Edema Measurements - multi extremities '[]'$  - 0 Nutritional Assessment / Counseling / Intervention '[]'$  - 0 Lower Extremity Assessment (monofilament, tuning fork, pulses) '[]'$  - 0 Peripheral Arterial Disease Assessment (using hand held doppler) ASSESSMENTS - Ostomy and/or Continence Assessment and Care '[]'$  - 0 Incontinence Assessment and Management '[]'$  - 0 Ostomy Care Assessment and Management (repouching, etc.) PROCESS - Coordination of Care '[]'$  - Points for Discharge Coordination can only be taken for a new wound of unknown or different  etiology and a procedure 0 is NOT performed to that wound X- 1 15 Simple Patient / Family Education for ongoing care '[]'$  - 0 Complex (extensive) Patient / Family Education for ongoing care X- 1 10 Staff obtains Programmer, systems, Records, T Results / Process Orders est '[]'$  - 0 Staff telephones HHA, Nursing Homes / Clarify orders /  etc '[]'$  - 0 Routine Transfer to another Facility (non-emergent condition) '[]'$  - 0 Routine Hospital Admission (non-emergent condition) X- 1 15 New Admissions / Biomedical engineer / Ordering NPWT Apligraf, etc. , '[]'$  - 0 Emergency Hospital Admission (emergent condition) X- 1 10 Simple Discharge Coordination '[]'$  - 0 Complex (extensive) Discharge Coordination PROCESS - Special Needs '[]'$  - 0 Pediatric / Minor Patient Management '[]'$  - 0 Isolation Patient Management '[]'$  - 0 Hearing / Language / Visual special needs '[]'$  - 0 Assessment of Community assistance (transportation, D/C planning, etc.) '[]'$  - 0 Additional assistance / Altered mentation '[]'$  - 0 Support Surface(s) Assessment (bed, cushion, seat, etc.) INTERVENTIONS - Wound Cleansing / Measurement '[]'$  - Points for Wound Cleaning / Measurement, Wound Dressing, Specimen Collection and Specimen taken to lab can only 0 be taken for a new wound of unknown or different etiology and a procedure is NOT performed to that wound X- 1 5 Simple Wound Cleansing - one wound '[]'$  - 0 Complex Wound Cleansing - multiple wounds X- 1 5 Wound Imaging (photographs - any number of wounds) '[]'$  - 0 Wound Tracing (instead of photographs) X- 1 5 Simple Wound Measurement - one wound '[]'$  - 0 Complex Wound Measurement - multiple wounds INTERVENTIONS - Wound Dressings '[]'$  - 0 Small Wound Dressing one or multiple wounds '[]'$  - 0 Medium Wound Dressing one or multiple wounds '[]'$  - 0 Large Wound Dressing one or multiple wounds INTERVENTIONS - Miscellaneous '[]'$  - 0 External ear exam '[]'$  - 0 Specimen Collection (cultures, biopsies, blood,  body fluids, etc.) '[]'$  - 0 Specimen(s) / Culture(s) sent or taken to Lab for analysis '[]'$  - 0 Patient Transfer (multiple staff / Civil Service fast streamer / Similar devices) '[]'$  - 0 Simple Staple / Suture removal (25 or less) '[]'$  - 0 Complex Staple / Suture removal (26 or more) '[]'$  - 0 Hypo / Hyperglycemic Management (close monitor of Blood Glucose) '[]'$  - 0 Ankle / Brachial Index (ABI) - do not check if billed separately X- 1 5 Vital Signs Has the patient been seen at the hospital within the last three years: Yes Total Score: 95 Level Of Care: New/Established - Level 3 Electronic Signature(s) Signed: 10/20/2020 6:18:32 PM By: Rhae Hammock RN Entered By: Rhae Hammock on 10/20/2020 08:52:28 -------------------------------------------------------------------------------- Compression Therapy Details Patient Name: Date of Service: [name], [name] 10/20/2020 7:30 A M Medical Record Number: [id number] Patient Account Number: [id number] Date of Birth/Sex: Treating RN: 1971-01-28 (50 y.o. [name] Primary Care Nevelyn Mellott: [name] Other Clinician: Referring Jermiyah Ricotta: Treating Chistina Roston/Extender: Sammuel Bailiff in Treatment: 0 Compression Therapy Performed for Wound Assessment: Wound #4 Left,Medial Lower Leg Performed By: Clinician Rhae Hammock, RN Compression Type: Four Layer Post Procedure Diagnosis Same as Pre-procedure Electronic Signature(s) Signed: 10/20/2020 6:18:32 PM By: Rhae Hammock RN Entered By: Rhae Hammock on 10/20/2020 08:39:51 -------------------------------------------------------------------------------- Lower Extremity Assessment Details Patient Name: Date of Service: Megan Session D. 10/20/2020 7:30 A M Medical Record Number: [id number] Patient Account Number: [id number] Date of Birth/Sex: Treating RN: Sep 23, 1970 (50 y.o. [name] Primary Care Ilona Colley: [name] Other Clinician: Referring  Fergie Sherbert: Treating Angelynn Lemus/Extender: Sammuel Bailiff in Treatment: 0 Edema Assessment Assessed: [name]: Yes] [name]: No] Edema: [Left: Ye] [Right: s] Calf Left: Right: Point of Measurement: 25.1 cm From Medial Instep 50.5 cm Ankle Left: Right: Point of Measurement: 9 cm From Medial Instep 30 cm Vascular Assessment Pulses: Dorsalis Pedis Palpable: [Left:Yes] Posterior Tibial Palpable: [Left:Yes] Electronic Signature(s) Signed: 10/20/2020 6:18:32  PM By: Rhae Hammock RN Entered By: Rhae Hammock on 10/20/2020 08:07:13 -------------------------------------------------------------------------------- Multi Wound Chart Details Patient Name: Date of Service: Megan Session D. 10/20/2020 7:30 A M Medical Record Number: [id number] Patient Account Number: [id number] Date of Birth/Sex: Treating RN: 1971-02-17 (50 y.o. [name] Primary Care Reet Scharrer: [name] Other Clinician: Referring Nezar Buckles: Treating Krystel Fletchall/Extender: Sammuel Bailiff in Treatment: 0 Vital Signs Height(in): 67 Pulse(bpm): 77 Weight(lbs): 346 Blood Pressure(mmHg): 142/78 Body Mass Index(BMI): 54 Temperature(F): 97.9 Respiratory Rate(breaths/min): 17 Photos: [4:No Photos Left, Medial Lower Leg] [N/A:N/A N/A] Wound Location: [4:Gradually Appeared] [N/A:N/A] Wounding Event: [4:Venous Leg Ulcer] [N/A:N/A] Primary Etiology: [4:Sleep Apnea, Congestive Heart] [N/A:N/A] Comorbid History: [4:Failure, Coronary Artery Disease, Hypertension 09/26/2020] [N/A:N/A] Date Acquired: [4:0] [N/A:N/A] Weeks of Treatment: [4:Open] [N/A:N/A] Wound Status: [4:5.6x3.7x0.1] [N/A:N/A] Measurements L x W x D (cm) [4:16.273] [N/A:N/A] A (cm) : rea [4:1.627] [N/A:N/A] Volume (cm) : [4:Full Thickness Without Exposed] [N/A:N/A] Classification: [4:Support Structures Medium] [N/A:N/A] Exudate Amount: [4:Serosanguineous] [N/A:N/A] Exudate Type: [4:red, brown]  [N/A:N/A] Exudate Color: [4:Distinct, outline attached] [N/A:N/A] Wound Margin: [4:Large (67-100%)] [N/A:N/A] Granulation Amount: [4:Red, Pink] [N/A:N/A] Granulation Quality: [4:None Present (0%)] [N/A:N/A] Necrotic Amount: [4:Fascia: No] [N/A:N/A] Exposed Structures: [4:Fat Layer (Subcutaneous Tissue): No Tendon: No Muscle: No Joint: No Bone: No None] [N/A:N/A] Epithelialization: [4:Compression Therapy] [N/A:N/A] Treatment Notes Electronic Signature(s) Signed: 10/20/2020 9:19:14 AM By: [name] DO Signed: 10/20/2020 6:17:32 PM By: Baruch Gouty RN, BSN Entered By: [name] on 10/20/2020 09:10:43 -------------------------------------------------------------------------------- Multi-Disciplinary Care Plan Details Patient Name: Date of Service: [name], [name] 10/20/2020 7:30 A M Medical Record Number: [id number] Patient Account Number: [id number] Date of Birth/Sex: Treating RN: 04-07-1971 (50 y.o. [name] Primary Care Lathan Gieselman: [name] Other Clinician: Referring Jaeli Grubb: Treating Kristia Jupiter/Extender: Sammuel Bailiff in Treatment: 0 Multidisciplinary Care Plan reviewed with physician Active Inactive Venous Leg Ulcer Nursing Diagnoses: Actual venous Insuffiency (use after diagnosis is confirmed) Knowledge deficit related to disease process and management Potential for venous Insuffiency (use before diagnosis confirmed) Goals: Patient will maintain optimal edema control Date Initiated: 10/20/2020 Target Resolution Date: 11/17/2020 Goal Status: Active Interventions: Assess peripheral edema status every visit. Compression as ordered Treatment Activities: Therapeutic compression applied : 10/20/2020 Notes: Wound/Skin Impairment Nursing Diagnoses: Impaired tissue integrity Knowledge deficit related to ulceration/compromised skin integrity Goals: Patient/caregiver will verbalize understanding of skin care  regimen Date Initiated: 10/20/2020 Target Resolution Date: 11/17/2020 Goal Status: Active Ulcer/skin breakdown will have a volume reduction of 30% by week 4 Date Initiated: 10/20/2020 Target Resolution Date: 11/17/2020 Goal Status: Active Interventions: Assess patient/caregiver ability to obtain necessary supplies Assess patient/caregiver ability to perform ulcer/skin care regimen upon admission and as needed Assess ulceration(s) every visit Provide education on ulcer and skin care Treatment Activities: Skin care regimen initiated : 10/20/2020 Topical wound management initiated : 10/20/2020 Notes: Electronic Signature(s) Signed: 10/20/2020 6:17:32 PM By: Baruch Gouty RN, BSN Entered By: Baruch Gouty on 10/20/2020 08:02:25 -------------------------------------------------------------------------------- Pain Assessment Details Patient Name: Date of Service: [name], [name] 10/20/2020 7:30 A M Medical Record Number: [id number] Patient Account Number: [id number] Date of Birth/Sex: Treating RN: 04/08/1971 (50 y.o. [name] Primary Care Eriyonna Matsushita: [name] Other Clinician: Referring Querida Beretta: Treating Dimitri Dsouza/Extender: Sammuel Bailiff in Treatment: 0 Active Problems Location of Pain Severity and Description of Pain Patient Has Paino Yes Site Locations Pain Location: Generalized Pain, Pain in Ulcers With Dressing Change: Yes Duration of the Pain. Constant / Intermittento Intermittent Rate the pain. Current Pain Level: 9  Worst Pain Level: 10 Least Pain Level: 0 Tolerable Pain Level: 7 Character of Pain Describe the Pain: Aching Pain Management and Medication Current Pain Management: Medication: Yes Cold Application: No Rest: Yes Massage: No Activity: No T.E.N.S.: No Heat Application: No Leg drop or elevation: No Is the Current Pain Management Adequate: Adequate How does your wound impact your activities of daily  livingo Sleep: No Bathing: No Appetite: No Relationship With Others: No Bladder Continence: No Emotions: No Bowel Continence: No Work: No Toileting: No Drive: No Dressing: No Hobbies: No Electronic Signature(s) Signed: 10/20/2020 6:18:32 PM By: Rhae Hammock RN Entered By: Rhae Hammock on 10/20/2020 08:35:24 -------------------------------------------------------------------------------- Patient/Caregiver Education Details Patient Name: Date of Service: Megan Session D. 6/24/2022andnbsp7:30 A M Medical Record Number: [id number] Patient Account Number: [id number] Date of Birth/Gender: Treating RN: October 07, 1970 (50 y.o. [name] Primary Care Physician: [name] Other Clinician: Referring Physician: Treating Physician/Extender: Sammuel Bailiff in Treatment: 0 Education Assessment Education Provided To: Patient Education Topics Provided Venous: Methods: Explain/Verbal Responses: Reinforcements needed, State content correctly Wound/Skin Impairment: Methods: Explain/Verbal Responses: Reinforcements needed, State content correctly Electronic Signature(s) Signed: 10/20/2020 6:17:32 PM By: Baruch Gouty RN, BSN Entered By: Baruch Gouty on 10/20/2020 08:03:02 -------------------------------------------------------------------------------- Wound Assessment Details Patient Name: Date of Service: Megan Session D. 10/20/2020 7:30 A M Medical Record Number: [id number] Patient Account Number: [id number] Date of Birth/Sex: Treating RN: 10/23/1970 (50 y.o. [name] Primary Care Necola Bluestein: [name] Other Clinician: Referring Delawrence Fridman: Treating [name]: Sammuel Bailiff in Treatment: 0 Wound Status Wound Number: 4 Primary Venous Leg Ulcer Etiology: Wound Location: Left, Medial Lower Leg Wound Open Wounding Event: Gradually Appeared Status: Date Acquired:  09/26/2020 Comorbid Sleep Apnea, Congestive Heart Failure, Coronary Artery Weeks Of Treatment: 0 History: Disease, Hypertension Clustered Wound: No Photos Wound Measurements Length: (cm) 5.6 Width: (cm) 3.7 Depth: (cm) 0.1 Area: (cm) 16.273 Volume: (cm) 1.627 % Reduction in Area: 0% % Reduction in Volume: 0% Epithelialization: None Tunneling: No Undermining: No Wound Description Classification: Full Thickness Without Exposed Support Structures Wound Margin: Distinct, outline attached Exudate Amount: Medium Exudate Type: Serosanguineous Exudate Color: red, brown Foul Odor After Cleansing: No Slough/Fibrino No Wound Bed Granulation Amount: Large (67-100%) Exposed Structure Granulation Quality: Red, Pink Fascia Exposed: No Necrotic Amount: None Present (0%) Fat Layer (Subcutaneous Tissue) Exposed: No Tendon Exposed: No Muscle Exposed: No Joint Exposed: No Bone Exposed: No Electronic Signature(s) Signed: 10/20/2020 5:07:58 PM By: Sandre Kitty Signed: 10/20/2020 6:18:32 PM By: Rhae Hammock RN Entered By: Sandre Kitty on 10/20/2020 16:42:22 -------------------------------------------------------------------------------- Vitals Details Patient Name: Date of Service: [name], Nahlia D. 10/20/2020 7:30 A M Medical Record Number: [id number] Patient Account Number: [id number] Date of Birth/Sex: Treating RN: 11/12/1970 (50 y.o. [name] Primary Care [place] Cinqu[place]ani: [name] Other Clinician: Referring Elmond Poehlman: Treating Hanne Kegg/Extender: Sammuel Bailiff in Treatment: 0 Vital Signs Time Taken: 08:02 Temperature (F): 97.9 Height (in): 67 Pulse (bpm): 77 Weight (lbs): 346 Respiratory Rate (breaths/min): 17 Source: Stated Blood Pressure (mmHg): 142/78 Body Mass Index (BMI): 54.2 Reference Range: 80 - 120 mg / dl Electronic Signature(s) Signed: 10/20/2020 6:18:32 PM By: Rhae Hammock RN Entered By: Rhae Hammock on 10/20/2020 08:03:32

## 2020-10-24 ENCOUNTER — Ambulatory Visit (HOSPITAL_COMMUNITY)
Admission: RE | Admit: 2020-10-24 | Discharge: 2020-10-24 | Disposition: A | Discharge status: Home / Self Care | Payer: Medicaid Other | Source: Ambulatory Visit | Attending: Hematology | Admitting: Hematology

## 2020-10-24 ENCOUNTER — Other Ambulatory Visit: Payer: Self-pay

## 2020-10-24 DIAGNOSIS — Z452 Encounter for adjustment and management of vascular access device: Secondary | ICD-10-CM | POA: Insufficient documentation

## 2020-10-24 DIAGNOSIS — D500 Iron deficiency anemia secondary to blood loss (chronic): Secondary | ICD-10-CM | POA: Diagnosis not present

## 2020-10-24 HISTORY — PX: IR CV LINE INJECTION: IMG2294

## 2020-10-24 MED ORDER — HEPARIN SOD (PORK) LOCK FLUSH 100 UNIT/ML IV SOLN
INTRAVENOUS | Status: AC | PRN
  Administered 2020-10-24: 500 [IU] via INTRAVENOUS

## 2020-10-24 MED ORDER — HEPARIN SOD (PORK) LOCK FLUSH 100 UNIT/ML IV SOLN
INTRAVENOUS | Status: AC
  Filled 2020-10-24: qty 5

## 2020-10-24 MED ORDER — IOHEXOL 300 MG/ML  SOLN
10.0000 mL | Freq: Once | INTRAMUSCULAR | Status: AC | PRN
  Administered 2020-10-24: 10 mL via INTRAVENOUS

## 2020-10-24 NOTE — Procedures (Signed)
Pre Procedure Dx: Poor venous access; Pain associated with port reservoir.  Post Procedural Dx: Same  Contrast injection demonstrates appropriate position functionality of the Port a catheter with tip terminating within the superior atrial junction.  There is no evidence of a fibrin sheath, catheter kink or fracture.  No contrast extravasation.  IMPRESSION:  While the reservoir of the port a catheter is an atypical location located within the mid, medial aspect of the right breast, the reservoir was easily accessed by interventional radiology nursing and contrast injection demonstrates appropriate functionality.    I explained to the patient that we could revise the port a Catheter with placement of the reservoir at a more typical location within the superior aspect of right upper chest, caudal to the clavicle, however I would ONLY advise a port revision if the pain associated with the reservoir is truly intolerable as the port is otherwise working well.  Following this prolonged and detailed conversation, the patient will continue to monitor her symptoms and knows to call the interventional radiology department if she wishes to pursue definitive Port a catheter revision.   Ronny Bacon, MD Pager #: [phone]

## 2020-10-26 ENCOUNTER — Telehealth: Payer: Self-pay | Admitting: Internal Medicine

## 2020-10-26 DIAGNOSIS — I5032 Chronic diastolic (congestive) heart failure: Secondary | ICD-10-CM

## 2020-10-26 DIAGNOSIS — Z86718 Personal history of other venous thrombosis and embolism: Secondary | ICD-10-CM

## 2020-10-26 DIAGNOSIS — I10 Essential (primary) hypertension: Secondary | ICD-10-CM

## 2020-10-26 DIAGNOSIS — I2510 Atherosclerotic heart disease of native coronary artery without angina pectoris: Secondary | ICD-10-CM

## 2020-10-26 MED ORDER — FUROSEMIDE 80 MG PO TABS: 80.0000 mg | ORAL_TABLET | Freq: Every day | ORAL | 0 refills | Status: DC

## 2020-10-26 MED ORDER — ATORVASTATIN CALCIUM 40 MG PO TABS: 40.0000 mg | ORAL_TABLET | Freq: Every day | ORAL | 0 refills | Status: DC

## 2020-10-26 MED ORDER — CARVEDILOL 25 MG PO TABS: ORAL_TABLET | ORAL | 0 refills | Status: DC

## 2020-10-26 MED ORDER — SPIRONOLACTONE 25 MG PO TABS: 25.0000 mg | ORAL_TABLET | Freq: Every day | ORAL | 0 refills | Status: DC

## 2020-10-26 MED ORDER — AMLODIPINE BESYLATE 5 MG PO TABS: 2.5000 mg | ORAL_TABLET | Freq: Every day | ORAL | 0 refills | Status: DC

## 2020-10-26 MED ORDER — APIXABAN 5 MG PO TABS: 5.0000 mg | ORAL_TABLET | Freq: Two times a day (BID) | ORAL | 0 refills | Status: DC

## 2020-10-26 NOTE — Telephone Encounter (Signed)
Pt wants PCP before upcoming appt that she is not feeling relief w/ the  methocarbamol (ROBAXIN) 500 MG tablet [id number]   Pt asks if PCP could send Percocet, or something stronger to  [id number] - [address] [place] [address]  Phone:  [phone]  Fax:   Pt states she needs something before her appointment. Pt also stated she would like refills on her meds. Pt was advised this might be done at the time of her upcoming appt. Pt states she has enough until then but she would like to have refills available at the pharmacy.

## 2020-10-26 NOTE — Telephone Encounter (Signed)
Returned pt call and left a detailed vm making her aware that we do not prescribe narcotics at this office also in regards to her rx needing refills since she has enough until her appt that once she comes in for her appt Dr. [name] will send refills to the pharmacy, but I will send this message to the provider to see if there is something that she can prescribe since the Robaxin is not helping

## 2020-10-27 ENCOUNTER — Encounter (HOSPITAL_BASED_OUTPATIENT_CLINIC_OR_DEPARTMENT_OTHER): Payer: Medicaid Other | Attending: Internal Medicine | Admitting: Internal Medicine

## 2020-10-27 ENCOUNTER — Other Ambulatory Visit: Payer: Self-pay

## 2020-10-27 DIAGNOSIS — I890 Lymphedema, not elsewhere classified: Secondary | ICD-10-CM | POA: Diagnosis not present

## 2020-10-27 DIAGNOSIS — L97929 Non-pressure chronic ulcer of unspecified part of left lower leg with unspecified severity: Secondary | ICD-10-CM | POA: Insufficient documentation

## 2020-10-27 DIAGNOSIS — I872 Venous insufficiency (chronic) (peripheral): Secondary | ICD-10-CM | POA: Insufficient documentation

## 2020-10-27 DIAGNOSIS — Z87891 Personal history of nicotine dependence: Secondary | ICD-10-CM | POA: Insufficient documentation

## 2020-10-27 DIAGNOSIS — L97829 Non-pressure chronic ulcer of other part of left lower leg with unspecified severity: Secondary | ICD-10-CM

## 2020-10-27 DIAGNOSIS — I110 Hypertensive heart disease with heart failure: Secondary | ICD-10-CM | POA: Diagnosis not present

## 2020-10-27 DIAGNOSIS — Z86718 Personal history of other venous thrombosis and embolism: Secondary | ICD-10-CM | POA: Insufficient documentation

## 2020-10-27 DIAGNOSIS — I5032 Chronic diastolic (congestive) heart failure: Secondary | ICD-10-CM | POA: Insufficient documentation

## 2020-10-27 NOTE — Telephone Encounter (Signed)
Returned pt call and left a detailed vm informing pt of provider response and if she has any questions or concerns to give us a call  

## 2020-10-27 NOTE — Telephone Encounter (Signed)
Pt is calling back to state that she did go [facility] radiology in March. Pt was told that she could not come with out something written from her dr. An the lidocaine patches are not covered by her insurance CB- [phone]

## 2020-10-31 NOTE — Progress Notes (Signed)
[name] ([id number]) Visit Report for 10/27/2020 Arrival Information Details Patient Name: Date of Service: Megan, BEUS D. 10/27/2020 10:00 A M Medical Record Number: [id number] Patient Account Number: [id number] Date of Birth/Sex: Treating RN: [birth year] (50 y.o. [name], [name] Primary Care Abdulrahim Siddiqi: [name] Other Clinician: Referring Kashonda Sarkisyan: Treating Ariyon Gerstenberger/Extender: Sammuel Bailiff in Treatment: 1 Visit Information History Since Last Visit Added or deleted any medications: No Patient Arrived: Ambulatory Any new allergies or adverse reactions: No Arrival Time: 09:29 Had a fall or experienced change in No Accompanied By: self activities of daily living that may affect Transfer Assistance: None risk of falls: Patient Identification Verified: Yes Signs or symptoms of abuse/neglect since last visito No Secondary Verification Process Completed: Yes Hospitalized since last visit: No Patient Requires Transmission-Based Precautions: No Implantable device outside of the clinic excluding No Patient Has Alerts: Yes cellular tissue based products placed in the center Patient Alerts: ABI's: 11/21 L:1.17 since last visit: Has Dressing in Place as Prescribed: Yes Pain Present Now: Yes Electronic Signature(s) Signed: 10/27/2020 11:33:15 AM By: Rhae Hammock RN Entered By: Rhae Hammock on 10/27/2020 09:32:17 -------------------------------------------------------------------------------- Compression Therapy Details Patient Name: Date of Service: Megan Gilding, [name] 10/27/2020 10:00 A M Medical Record Number: [id number] Patient Account Number: [id number] Date of Birth/Sex: Treating RN: [birth year] (50 y.o. [name] Primary Care Analuisa Tudor: [name] Other Clinician: Referring Jimia Gentles: Treating Layali Freund/Extender: Sammuel Bailiff in Treatment: 1 Compression Therapy Performed for Wound  Assessment: Wound #4 Left,Medial Lower Leg Performed By: Clinician Leane Call, RN Compression Type: Four Layer Post Procedure Diagnosis Same as Pre-procedure Electronic Signature(s) Signed: 10/27/2020 1:24:03 PM By: Baruch Gouty RN, BSN Entered By: Baruch Gouty on 10/27/2020 10:06:39 -------------------------------------------------------------------------------- Encounter Discharge Information Details Patient Name: Date of Service: Megan Session D. 10/27/2020 10:00 A M Medical Record Number: [id number] Patient Account Number: [id number] Date of Birth/Sex: Treating RN: [birth year] (50 y.o. [name] Primary Care Saja Bartolini: [name] Other Clinician: Referring Azyriah Nevins: Treating Daiki Dicostanzo/Extender: Sammuel Bailiff in Treatment: 1 Encounter Discharge Information Items Post Procedure Vitals Discharge Condition: Stable Temperature (F): 97.7 Ambulatory Status: Ambulatory Pulse (bpm): 111 Discharge Destination: Home Respiratory Rate (breaths/min): 17 Transportation: Private Auto Blood Pressure (mmHg): 159/87 Accompanied By: alone Schedule Follow-up Appointment: No Clinical Summary of Care: Electronic Signature(s) Signed: 10/31/2020 6:17:43 PM By: Leane Call Entered By: Leane Call on 10/27/2020 10:51:54 -------------------------------------------------------------------------------- Lower Extremity Assessment Details Patient Name: Date of Service: Megan Session D. 10/27/2020 10:00 A M Medical Record Number: [id number] Patient Account Number: [id number] Date of Birth/Sex: Treating RN: [birth year] (50 y.o. [name], [name] Primary Care Nel Stoneking: [name] Other Clinician: Referring Vishruth Seoane: Treating Davaun Quintela/Extender: Sammuel Bailiff in Treatment: 1 Edema Assessment Assessed: [name]: Yes] [name]: No] Edema: [Left: Ye] [Right: s] Calf Left: Right: Point of Measurement: 25.1 cm  From Medial Instep 50.5 cm Ankle Left: Right: Point of Measurement: 9 cm From Medial Instep 30 cm Vascular Assessment Pulses: Dorsalis Pedis Palpable: [Left:Yes] Posterior Tibial Palpable: [Left:Yes] Electronic Signature(s) Signed: 10/27/2020 11:33:15 AM By: Rhae Hammock RN Entered By: Rhae Hammock on 10/27/2020 09:33:38 -------------------------------------------------------------------------------- Multi Wound Chart Details Patient Name: Date of Service: Megan Gilding, [name] 10/27/2020 10:00 A M Medical Record Number: [id number] Patient Account Number: [id number] Date of Birth/Sex: Treating RN: October 27, 1970 (50 y.o. [name] Primary Care Christia Domke: [name] Other Clinician: Referring Charlyn Vialpando: Treating Pattijo Juste/Extender: Sammuel Bailiff in Treatment: 1 Vital Signs Height(in): 81  Capillary Blood Glucose(mg/dl): 156 Weight(lbs): 346 Pulse(bpm): 111 Body Mass Index(BMI): 54 Blood Pressure(mmHg): 159/87 Temperature(F): 97.7 Respiratory Rate(breaths/min): 17 Photos: [N/A:N/A] Left, Medial Lower Leg N/A N/A Wound Location: Gradually Appeared N/A N/A Wounding Event: Venous Leg Ulcer N/A N/A Primary Etiology: Sleep Apnea, Congestive Heart N/A N/A Comorbid History: Failure, Coronary Artery Disease, Hypertension 09/26/2020 N/A N/A Date Acquired: 1 N/A N/A Weeks of Treatment: Open N/A N/A Wound Status: 4.6x3.1x0.1 N/A N/A Measurements L x W x D (cm) 11.2 N/A N/A A (cm) : rea 1.12 N/A N/A Volume (cm) : 31.20% N/A N/A % Reduction in A rea: 31.20% N/A N/A % Reduction in Volume: Full Thickness Without Exposed N/A N/A Classification: Support Structures Medium N/A N/A Exudate A mount: Serosanguineous N/A N/A Exudate Type: red, brown N/A N/A Exudate Color: Distinct, outline attached N/A N/A Wound Margin: Large (67-100%) N/A N/A Granulation A mount: Red, Pink N/A N/A Granulation Quality: None Present (0%) N/A  N/A Necrotic A mount: Fascia: No N/A N/A Exposed Structures: Fat Layer (Subcutaneous Tissue): No Tendon: No Muscle: No Joint: No Bone: No Small (1-33%) N/A N/A Epithelialization: Debridement - Excisional N/A N/A Debridement: Pre-procedure Verification/Time Out 10:02 N/A N/A Taken: Other N/A N/A Pain Control: Subcutaneous, Slough N/A N/A Tissue Debrided: Skin/Subcutaneous Tissue N/A N/A Level: 14.26 N/A N/A Debridement A (sq cm): rea Curette N/A N/A Instrument: Minimum N/A N/A Bleeding: Pressure N/A N/A Hemostasis A chieved: 0 N/A N/A Procedural Pain: 0 N/A N/A Post Procedural Pain: Procedure was tolerated well N/A N/A Debridement Treatment Response: 4.6x3.1x0.1 N/A N/A Post Debridement Measurements L x W x D (cm) 1.12 N/A N/A Post Debridement Volume: (cm) Compression Therapy N/A N/A Procedures Performed: Debridement Treatment Notes Wound #4 (Lower Leg) Wound Laterality: Left, Medial Cleanser Soap and Water Discharge Instruction: May shower and wash wound with dial antibacterial soap and water prior to dressing change. Peri-Wound Care Topical Primary Dressing Hydrofera Blue Ready Foam, 4x5 in Discharge Instruction: Apply to wound bed as instructed Santyl Ointment Discharge Instruction: Apply nickel thick amount to wound bed as instructed Secondary Dressing Woven Gauze Sponge, Non-Sterile 4x4 in Discharge Instruction: Apply over primary dressing as directed. ABD Pad, 8x10 Discharge Instruction: Apply over primary dressing as directed. CarboFLEX Odor Control Dressing, 4x4 in Discharge Instruction: Apply over primary dressing as directed. Secured With Compression Wrap FourPress (4 layer compression wrap) Discharge Instruction: Apply four layer compression as directed. May also use Miliken CoFlex 2 layer compression system as alternative. Unnaboot w/Calamine, 4x10 (in/yd) Discharge Instruction: unna at top of wrap to help secure Compression  Stockings Add-Ons Electronic Signature(s) Signed: 10/27/2020 1:36:20 PM By: [name] DO Signed: 10/31/2020 7:10:16 PM By: Baruch Gouty RN, BSN Entered By: [name] on 10/27/2020 13:28:58 -------------------------------------------------------------------------------- [place] Details Patient Name: Date of Service: Megan Gilding, Josphine D. 10/27/2020 10:00 A M Medical Record Number: [id number] Patient Account Number: [id number] Date of Birth/Sex: Treating RN: [birth year] (50 y.o. [name] Primary Care Key Cen: [name] Other Clinician: Referring Jaide Hillenburg: Treating Zamoria Boss/Extender: Sammuel Bailiff in Treatment: 1 Multidisciplinary Care Plan reviewed with physician Active Inactive Venous Leg Ulcer Nursing Diagnoses: Actual venous Insuffiency (use after diagnosis is confirmed) Knowledge deficit related to disease process and management Potential for venous Insuffiency (use before diagnosis confirmed) Goals: Patient will maintain optimal edema control Date Initiated: 10/20/2020 Target Resolution Date: 11/17/2020 Goal Status: Active Interventions: Assess peripheral edema status every visit. Compression as ordered Treatment Activities: Therapeutic compression applied : 10/20/2020 Notes: Wound/Skin Impairment Nursing Diagnoses: Impaired tissue integrity Knowledge deficit  related to ulceration/compromised skin integrity Goals: Patient/caregiver will verbalize understanding of skin care regimen Date Initiated: 10/20/2020 Target Resolution Date: 11/17/2020 Goal Status: Active Ulcer/skin breakdown will have a volume reduction of 30% by week 4 Date Initiated: 10/20/2020 Target Resolution Date: 11/17/2020 Goal Status: Active Interventions: Assess patient/caregiver ability to obtain necessary supplies Assess patient/caregiver ability to perform ulcer/skin care regimen upon admission and as needed Assess  ulceration(s) every visit Provide education on ulcer and skin care Treatment Activities: Skin care regimen initiated : 10/20/2020 Topical wound management initiated : 10/20/2020 Notes: Electronic Signature(s) Signed: 10/27/2020 1:24:03 PM By: Baruch Gouty RN, BSN Entered By: Baruch Gouty on 10/27/2020 10:36:02 -------------------------------------------------------------------------------- Pain Assessment Details Patient Name: Date of Service: Megan Gilding, Paulette D. 10/27/2020 10:00 Aneth Record Number: [id number] Patient Account Number: [id number] Date of Birth/Sex: Treating RN: July 05, 1970 (50 y.o. [name], [name] Primary Care Austine Kelsay: [name] Other Clinician: Referring Deshane Cotroneo: Treating Treshaun Carrico/Extender: Sammuel Bailiff in Treatment: 1 Active Problems Location of Pain Severity and Description of Pain Patient Has Paino Yes Site Locations Pain Location: Pain Location: Pain in Ulcers Duration of the Pain. Constant / Intermittento Intermittent Rate the pain. Current Pain Level: 9 Worst Pain Level: 10 Least Pain Level: 0 Tolerable Pain Level: 9 Character of Pain Describe the Pain: Aching Pain Management and Medication Current Pain Management: Medication: Yes Cold Application: No Rest: Yes Massage: No Activity: No T.E.N.S.: No Heat Application: No Leg drop or elevation: No Is the Current Pain Management Adequate: Adequate How does your wound impact your activities of daily livingo Sleep: No Bathing: No Appetite: No Relationship With Others: No Bladder Continence: No Emotions: No Bowel Continence: No Work: No Toileting: No Drive: No Dressing: No Hobbies: No Electronic Signature(s) Signed: 10/27/2020 11:33:15 AM By: Rhae Hammock RN Entered By: Rhae Hammock on 10/27/2020 09:33:12 -------------------------------------------------------------------------------- Patient/Caregiver Education Details Patient  Name: Date of Service: Megan Session D. 7/1/2022andnbsp10:00 A M Medical Record Number: [id number] Patient Account Number: [id number] Date of Birth/Gender: Treating RN: 1971-04-10 (50 y.o. [name] Primary Care Physician: [name] Other Clinician: Referring Physician: Treating Physician/Extender: Sammuel Bailiff in Treatment: 1 Education Assessment Education Provided To: Patient Education Topics Provided Venous: Methods: Explain/Verbal Responses: Reinforcements needed, State content correctly Wound/Skin Impairment: Methods: Explain/Verbal Responses: Reinforcements needed, State content correctly Electronic Signature(s) Signed: 10/27/2020 1:24:03 PM By: Baruch Gouty RN, BSN Entered By: Baruch Gouty on 10/27/2020 10:36:23 -------------------------------------------------------------------------------- Wound Assessment Details Patient Name: Date of Service: Megan Session D. 10/27/2020 10:00 A M Medical Record Number: [id number] Patient Account Number: [id number] Date of Birth/Sex: Treating RN: 09/16/1970 (50 y.o. [name], [name] Primary Care Kaho Selle: [name] Other Clinician: Referring Barbarajean Kinzler: Treating Jenai Scaletta/Extender: Sammuel Bailiff in Treatment: 1 Wound Status Wound Number: 4 Primary Venous Leg Ulcer Etiology: Wound Location: Left, Medial Lower Leg Wound Open Wounding Event: Gradually Appeared Status: Date Acquired: 09/26/2020 Comorbid Sleep Apnea, Congestive Heart Failure, Coronary Artery Weeks Of Treatment: 1 History: Disease, Hypertension Clustered Wound: No Photos Wound Measurements Length: (cm) 4.6 Width: (cm) 3.1 Depth: (cm) 0.1 Area: (cm) 11.2 Volume: (cm) 1.12 % Reduction in Area: 31.2% % Reduction in Volume: 31.2% Epithelialization: Small (1-33%) Tunneling: No Undermining: No Wound Description Classification: Full Thickness Without Exposed Support  Structures Wound Margin: Distinct, outline attached Exudate Amount: Medium Exudate Type: Serosanguineous Exudate Color: red, brown Foul Odor After Cleansing: No Slough/Fibrino No Wound Bed Granulation Amount: Large (67-100%) Exposed Structure Granulation Quality: Red, Pink Fascia Exposed: No Necrotic  Amount: None Present (0%) Fat Layer (Subcutaneous Tissue) Exposed: No Tendon Exposed: No Muscle Exposed: No Joint Exposed: No Bone Exposed: No Treatment Notes Wound #4 (Lower Leg) Wound Laterality: Left, Medial Cleanser Soap and Water Discharge Instruction: May shower and wash wound with dial antibacterial soap and water prior to dressing change. Peri-Wound Care Topical Primary Dressing Hydrofera Blue Ready Foam, 4x5 in Discharge Instruction: Apply to wound bed as instructed Santyl Ointment Discharge Instruction: Apply nickel thick amount to wound bed as instructed Secondary Dressing Woven Gauze Sponge, Non-Sterile 4x4 in Discharge Instruction: Apply over primary dressing as directed. ABD Pad, 8x10 Discharge Instruction: Apply over primary dressing as directed. CarboFLEX Odor Control Dressing, 4x4 in Discharge Instruction: Apply over primary dressing as directed. Secured With Compression Wrap FourPress (4 layer compression wrap) Discharge Instruction: Apply four layer compression as directed. May also use Miliken CoFlex 2 layer compression system as alternative. Unnaboot w/Calamine, 4x10 (in/yd) Discharge Instruction: unna at top of wrap to help secure Compression Stockings Add-Ons Electronic Signature(s) Signed: 10/27/2020 11:33:15 AM By: Rhae Hammock RN Signed: 10/27/2020 11:35:39 AM By: Sandre Kitty Entered By: Sandre Kitty on 10/27/2020 11:31:56 -------------------------------------------------------------------------------- Vitals Details Patient Name: Date of Service: Megan Gilding, Shia D. 10/27/2020 10:00 A M Medical Record Number: [id number] Patient  Account Number: [id number] Date of Birth/Sex: Treating RN: 1970/09/30 (50 y.o. [name], [name] Primary Care Alexei Ey: [name] Other Clinician: Referring Morgon Pamer: Treating Zaim Nitta/Extender: Sammuel Bailiff in Treatment: 1 Vital Signs Time Taken: 09:32 Temperature (F): 97.7 Height (in): 67 Pulse (bpm): 111 Weight (lbs): 346 Respiratory Rate (breaths/min): 17 Body Mass Index (BMI): 54.2 Blood Pressure (mmHg): 159/87 Capillary Blood Glucose (mg/dl): 156 Reference Range: 80 - 120 mg / dl Electronic Signature(s) Signed: 10/27/2020 11:33:15 AM By: Rhae Hammock RN Entered By: Rhae Hammock on 10/27/2020 09:32:42

## 2020-10-31 NOTE — Progress Notes (Signed)
LOYAL, WACH ([id number]) Visit Report for 10/27/2020 Chief Complaint Document Details Patient Name: Date of Service: ISABELE, FAM D. 10/27/2020 10:00 A M Medical Record Number: [id number] Patient Account Number: [id number] Date of Birth/Sex: Treating RN: [birth year] (50 y.o. [name] Primary Care Provider: [name] Other Clinician: Referring Provider: Treating Provider/Extender: Sammuel Bailiff in Treatment: 1 Information Obtained from: Patient Chief Complaint Left lower extremity wound Electronic Signature(s) Signed: 10/27/2020 1:36:20 PM By: [name] DO Entered By: [name] on 10/27/2020 13:30:01 -------------------------------------------------------------------------------- Debridement Details Patient Name: Date of Service: [name], Shakeema D. 10/27/2020 10:00 A M Medical Record Number: [id number] Patient Account Number: [id number] Date of Birth/Sex: Treating RN: 1970-08-09 (50 y.o. [name] Primary Care Provider: [name] Other Clinician: Referring Provider: Treating Provider/Extender: Sammuel Bailiff in Treatment: 1 Debridement Performed for Assessment: Wound #4 Left,Medial Lower Leg Performed By: Physician [name], DO Debridement Type: Debridement Severity of Tissue Pre Debridement: Fat layer exposed Level of Consciousness (Pre-procedure): Awake and Alert Pre-procedure Verification/Time Out Yes - 10:02 Taken: Start Time: 10:03 Pain Control: Other : Benzocaine 20% T Area Debrided (L x W): otal 4.6 (cm) x 3.1 (cm) = 14.26 (cm) Tissue and other material debrided: Viable, Non-Viable, Slough, Subcutaneous, Slough Level: Skin/Subcutaneous Tissue Debridement Description: Excisional Instrument: Curette Bleeding: Minimum Hemostasis Achieved: Pressure Procedural Pain: 0 Post Procedural Pain: 0 Response to Treatment: Procedure was tolerated well Level of  Consciousness (Post- Awake and Alert procedure): Post Debridement Measurements of Total Wound Length: (cm) 4.6 Width: (cm) 3.1 Depth: (cm) 0.1 Volume: (cm) 1.12 Character of Wound/Ulcer Post Debridement: Improved Severity of Tissue Post Debridement: Fat layer exposed Post Procedure Diagnosis Same as Pre-procedure Electronic Signature(s) Signed: 10/27/2020 1:24:03 PM By: Baruch Gouty RN, BSN Signed: 10/27/2020 1:36:20 PM By: [name] DO Entered By: Baruch Gouty on 10/27/2020 10:08:21 -------------------------------------------------------------------------------- HPI Details Patient Name: Date of Service: [name], Tarika D. 10/27/2020 10:00 A M Medical Record Number: [id number] Patient Account Number: [id number] Date of Birth/Sex: Treating RN: 20-Apr-1971 (50 y.o. [name] Primary Care Provider: [name] Other Clinician: Referring Provider: Treating Provider/Extender: Sammuel Bailiff in Treatment: 1 History of Present Illness HPI Description: Admission 6/24 Ms. Shalon Hallen is a 50 year old female with a past medical history of chronic venous insufficiency, lymphedema, DVT on anticoagulation and diastolic heart failure that presents to the clinic for left lower extremity wound. She was last seen 4 months ago in our clinic for the same issue. The reoccurring wound started at the end of May and has been going on for 1 month. She has been using an ointment and I am unclear what this is. She tries to keep her leg elevated with her compression stocking. She also reports she has lymphedema pumps and has been using them as well. She reports mild pain to the area. She denies signs of infection including increased warmth, erythema or purulent drainage. 7/1; patient presents for 1 week follow-up. She has tolerated the wrap well. Unfortunately she did have trouble with the wrap Sliding down her leg 2 days ago. She has no issues or complaints  today. She denies signs of infection. Electronic Signature(s) Signed: 10/27/2020 1:36:20 PM By: [name] DO Entered By: [name] on 10/27/2020 13:33:28 -------------------------------------------------------------------------------- Physical Exam Details Patient Name: Date of Service: [name] 10/27/2020 10:00 A M Medical Record Number: [id number] Patient Account Number: [id number] Date of Birth/Sex: Treating RN: 1970-11-07 (50 y.o. F) Baruch Gouty  Primary Care Provider: [name] Other Clinician: Referring Provider: Treating Provider/Extender: Sammuel Bailiff in Treatment: 1 Constitutional respirations regular, non-labored and within target range for patient.[name] Psychiatric pleasant and cooperative. Notes Left lower extremity: On the medial aspect there is an open wound with granulation tissue and fibrinous tissue present. No signs of infection. Good edema control. Electronic Signature(s) Signed: 10/27/2020 1:36:20 PM By: [name] DO Entered By: [name] on 10/27/2020 13:31:28 -------------------------------------------------------------------------------- Physician Orders Details Patient Name: Date of Service: [name] 10/27/2020 10:00 A M Medical Record Number: [id number] Patient Account Number: [id number] Date of Birth/Sex: Treating RN: 1970-12-22 (50 y.o. [name] Primary Care Provider: [name] Other Clinician: Referring Provider: Treating Provider/Extender: Sammuel Bailiff in Treatment: 1 Verbal / Phone Orders: No Diagnosis Coding ICD-10 Coding Code Description [phone] Non-pressure chronic ulcer of other part of left lower leg with unspecified severity I87.2 Venous insufficiency (chronic) (peripheral) I89.0 Lymphedema, not elsewhere classified Z86.718 Personal history of other venous thrombosis and embolism Follow-up Appointments ppointment in  1 week. - Dr. Heber [place] Return A Other: - Be sure you go to your follow up appt. with Vascular on 08/11 Bathing/ Shower/ Hygiene May shower with protection but do not get wound dressing(s) wet. - use cast protector to keep wrap dry in the shower Edema Control - Lymphedema / SCD / Other Bilateral Lower Extremities Lymphedema Pumps. Use Lymphedema pumps on leg(s) 2-3 times a day for 45-60 minutes. If wearing any wraps or hose, do not remove them. Continue exercising as instructed. Elevate legs to the level of the heart or above for 30 minutes daily and/or when sitting, a frequency of: Avoid standing for long periods of time. Patient to wear own compression stockings every day. - right leg daily Exercise regularly Wound Treatment Wound #4 - Lower Leg Wound Laterality: Left, Medial Cleanser: Soap and Water 1 x Per Week/15 Days Discharge Instructions: May shower and wash wound with dial antibacterial soap and water prior to dressing change. Prim Dressing: Hydrofera Blue Ready Foam, 4x5 in 1 x Per Week/15 Days ary Discharge Instructions: Apply to wound bed as instructed Prim Dressing: Santyl Ointment 1 x Per Week/15 Days ary Discharge Instructions: Apply nickel thick amount to wound bed as instructed Secondary Dressing: Woven Gauze Sponge, Non-Sterile 4x4 in 1 x Per Week/15 Days Discharge Instructions: Apply over primary dressing as directed. Secondary Dressing: ABD Pad, 8x10 1 x Per Week/15 Days Discharge Instructions: Apply over primary dressing as directed. Secondary Dressing: CarboFLEX Odor Control Dressing, 4x4 in 1 x Per Week/15 Days Discharge Instructions: Apply over primary dressing as directed. Compression Wrap: FourPress (4 layer compression wrap) 1 x Per Week/15 Days Discharge Instructions: Apply four layer compression as directed. May also use Miliken CoFlex 2 layer compression system as alternative. Compression Wrap: Unnaboot w/Calamine, 4x10 (in/yd) 1 x Per Week/15  Days Discharge Instructions: unna at top of wrap to help secure Electronic Signature(s) Signed: 10/27/2020 1:36:20 PM By: [name] DO Previous Signature: 10/27/2020 1:24:03 PM Version By: Baruch Gouty RN, BSN Entered By: [name] on 10/27/2020 13:31:51 -------------------------------------------------------------------------------- Problem List Details Patient Name: Date of Service: [name], Lake D. 10/27/2020 10:00 A M Medical Record Number: [id number] Patient Account Number: [id number] Date of Birth/Sex: Treating RN: [birth year] (50 y.o. [name] Primary Care Provider: [name] Other Clinician: Referring Provider: Treating Provider/Extender: Sammuel Bailiff in Treatment: 1 Active Problems ICD-10 Encounter Code Description Active Date MDM Diagnosis L97.829 Non-pressure chronic ulcer  of other part of left lower leg with unspecified 10/20/2020 No Yes severity I87.2 Venous insufficiency (chronic) (peripheral) 10/20/2020 No Yes I89.0 Lymphedema, not elsewhere classified 10/20/2020 No Yes Z86.718 Personal history of other venous thrombosis and embolism 10/20/2020 No Yes Inactive Problems Resolved Problems Electronic Signature(s) Signed: 10/27/2020 1:36:20 PM By: [name] DO Previous Signature: 10/27/2020 1:24:03 PM Version By: Baruch Gouty RN, BSN Entered By: [name] on 10/27/2020 13:28:51 -------------------------------------------------------------------------------- Progress Note Details Patient Name: Date of Service: [name], Tariyah D. 10/27/2020 10:00 A M Medical Record Number: [id number] Patient Account Number: [id number] Date of Birth/Sex: Treating RN: 1970-11-24 (50 y.o. [name] Primary Care Provider: [name] Other Clinician: Referring Provider: Treating Provider/Extender: Sammuel Bailiff in Treatment: 1 Subjective Chief Complaint Information obtained  from Patient Left lower extremity wound History of Present Illness (HPI) Admission 6/24 Ms. Rumi Cerna is a 50 year old female with a past medical history of chronic venous insufficiency, lymphedema, DVT on anticoagulation and diastolic heart failure that presents to the clinic for left lower extremity wound. She was last seen 4 months ago in our clinic for the same issue. The reoccurring wound started at the end of May and has been going on for 1 month. She has been using an ointment and I am unclear what this is. She tries to keep her leg elevated with her compression stocking. She also reports she has lymphedema pumps and has been using them as well. She reports mild pain to the area. She denies signs of infection including increased warmth, erythema or purulent drainage. 7/1; patient presents for 1 week follow-up. She has tolerated the wrap well. Unfortunately she did have trouble with the wrap Sliding down her leg 2 days ago. She has no issues or complaints today. She denies signs of infection. Patient History Information obtained from Patient. Family History Diabetes - Mother,Maternal Grandparents, Heart Disease - Paternal Grandparents, Hypertension - Mother,Maternal Grandparents, Lung Disease - Father,Paternal Grandparents, No family history of Cancer, Hereditary Spherocytosis, Kidney Disease, Seizures, Stroke, Thyroid Problems, Tuberculosis. Social History Former smoker, Marital Status - Single, Alcohol Use - Never, Drug Use - No History, Caffeine Use - Daily. Medical History Eyes Denies history of Cataracts, Glaucoma, Optic Neuritis Ear/Nose/Mouth/Throat Denies history of Chronic sinus problems/congestion, Middle ear problems Hematologic/Lymphatic Denies history of Anemia, Hemophilia, Human Immunodeficiency Virus, Lymphedema, Sickle Cell Disease Respiratory Patient has history of Sleep Apnea Denies history of Aspiration, Asthma, Chronic Obstructive Pulmonary Disease (COPD),  Pneumothorax, Tuberculosis Cardiovascular Patient has history of Congestive Heart Failure, Coronary Artery Disease, Hypertension Denies history of Angina, Arrhythmia, Deep Vein Thrombosis, Hypotension, Myocardial Infarction, Peripheral Arterial Disease, Peripheral Venous Disease, Phlebitis, Vasculitis Gastrointestinal Denies history of Cirrhosis , Colitis, Crohnoos, Hepatitis A, Hepatitis B, Hepatitis C Endocrine Denies history of Type I Diabetes, Type II Diabetes Genitourinary Denies history of End Stage Renal Disease Immunological Denies history of Lupus Erythematosus, Raynaudoos, Scleroderma Integumentary (Skin) Denies history of History of Burn Musculoskeletal Denies history of Gout, Rheumatoid Arthritis, Osteoarthritis, Osteomyelitis Neurologic Denies history of Dementia, Neuropathy, Quadriplegia, Paraplegia, Seizure Disorder Oncologic Denies history of Received Chemotherapy, Received Radiation Psychiatric Denies history of Anorexia/bulimia, Confinement Anxiety Objective Constitutional respirations regular, non-labored and within target range for patient.. Vitals Time Taken: 9:32 AM, Height: 67 in, Weight: 346 lbs, BMI: 54.2, Temperature: 97.7 F, Pulse: 111 bpm, Respiratory Rate: 17 breaths/min, Blood Pressure: 159/87 mmHg, Capillary Blood Glucose: 156 mg/dl. Psychiatric pleasant and cooperative. General Notes: Left lower extremity: On the medial aspect there is an open wound with granulation  tissue and fibrinous tissue present. No signs of infection. Good edema control. Integumentary (Hair, Skin) Wound #4 status is Open. Original cause of wound was Gradually Appeared. The date acquired was: 09/26/2020. The wound has been in treatment 1 weeks. The wound is located on the Left,Medial Lower Leg. The wound measures 4.6cm length x 3.1cm width x 0.1cm depth; 11.2cm^2 area and 1.12cm^3 volume. There is no tunneling or undermining noted. There is a medium amount of serosanguineous  drainage noted. The wound margin is distinct with the outline attached to the wound base. There is large (67-100%) red, pink granulation within the wound bed. There is no necrotic tissue within the wound bed. Assessment Active Problems ICD-10 Non-pressure chronic ulcer of other part of left lower leg with unspecified severity Venous insufficiency (chronic) (peripheral) Lymphedema, not elsewhere classified Personal history of other venous thrombosis and embolism Patient's wound has shown improvement in size. There is some fibrinous tissue that I debrided. I think she would benefit from a better debriding dressing and we will switch to [facility] under compression. I told her to call our clinic if the wrap were to come off again and we will replace it. Unfortunately due to the anatomy of her leg this is a difficult wrap to stay in place despite using a layer of Unna boot to the top to let it from sliding down. We will see her back in a week. Procedures Wound #4 Pre-procedure diagnosis of Wound #4 is a Venous Leg Ulcer located on the Left,Medial Lower Leg .Severity of Tissue Pre Debridement is: Fat layer exposed. There was a Excisional Skin/Subcutaneous Tissue Debridement with a total area of 14.26 sq cm performed by [name], DO. With the following instrument(s): Curette to remove Viable and Non-Viable tissue/material. Material removed includes Subcutaneous Tissue and Slough and after achieving pain control using Other (Benzocaine 20%). No specimens were taken. A time out was conducted at 10:02, prior to the start of the procedure. A Minimum amount of bleeding was controlled with Pressure. The procedure was tolerated well with a pain level of 0 throughout and a pain level of 0 following the procedure. Post Debridement Measurements: 4.6cm length x 3.1cm width x 0.1cm depth; 1.12cm^3 volume. Character of Wound/Ulcer Post Debridement is improved. Severity of Tissue Post Debridement is:  Fat layer exposed. Post procedure Diagnosis Wound #4: Same as Pre-Procedure Pre-procedure diagnosis of Wound #4 is a Venous Leg Ulcer located on the Left,Medial Lower Leg . There was a Four Layer Compression Therapy Procedure by Leane Call, RN. Post procedure Diagnosis Wound #4: Same as Pre-Procedure Plan Follow-up Appointments: Return Appointment in 1 week. - Dr. Heber [place] Other: - Be sure you go to your follow up appt. with Vascular on 08/11 Bathing/ Shower/ Hygiene: May shower with protection but do not get wound dressing(s) wet. - use cast protector to keep wrap dry in the shower Edema Control - Lymphedema / SCD / Other: Lymphedema Pumps. Use Lymphedema pumps on leg(s) 2-3 times a day for 45-60 minutes. If wearing any wraps or hose, do not remove them. Continue exercising as instructed. Elevate legs to the level of the heart or above for 30 minutes daily and/or when sitting, a frequency of: Avoid standing for long periods of time. Patient to wear own compression stockings every day. - right leg daily Exercise regularly WOUND #4: - Lower Leg Wound Laterality: Left, Medial Cleanser: Soap and Water 1 x Per Week/15 Days Discharge Instructions: May shower and wash wound with dial antibacterial soap and water  prior to dressing change. Prim Dressing: Hydrofera Blue Ready Foam, 4x5 in 1 x Per Week/15 Days ary Discharge Instructions: Apply to wound bed as instructed Prim Dressing: Santyl Ointment 1 x Per Week/15 Days ary Discharge Instructions: Apply nickel thick amount to wound bed as instructed Secondary Dressing: Woven Gauze Sponge, Non-Sterile 4x4 in 1 x Per Week/15 Days Discharge Instructions: Apply over primary dressing as directed. Secondary Dressing: ABD Pad, 8x10 1 x Per Week/15 Days Discharge Instructions: Apply over primary dressing as directed. Secondary Dressing: CarboFLEX Odor Control Dressing, 4x4 in 1 x Per Week/15 Days Discharge Instructions: Apply over primary  dressing as directed. Com pression Wrap: FourPress (4 layer compression wrap) 1 x Per Week/15 Days Discharge Instructions: Apply four layer compression as directed. May also use Miliken CoFlex 2 layer compression system as alternative. Com pression Wrap: Unnaboot w/Calamine, 4x10 (in/yd) 1 x Per Week/15 Days Discharge Instructions: unna at top of wrap to help secure 1. Hydrofera Blue, Santyl under 4-layer compression 2. Follow-up in 1 week Electronic Signature(s) Signed: 10/27/2020 1:36:20 PM By: [name] DO Entered By: [name] on 10/27/2020 13:35:49 -------------------------------------------------------------------------------- HxROS Details Patient Name: Date of Service: [name], Sunnie D. 10/27/2020 10:00 A M Medical Record Number: [id number] Patient Account Number: [id number] Date of Birth/Sex: Treating RN: 10/02/1970 (50 y.o. [name] Primary Care Provider: [name] Other Clinician: Referring Provider: Treating Provider/Extender: Sammuel Bailiff in Treatment: 1 Information Obtained From Patient Eyes Medical History: Negative for: Cataracts; Glaucoma; Optic Neuritis Ear/Nose/Mouth/Throat Medical History: Negative for: Chronic sinus problems/congestion; Middle ear problems Hematologic/Lymphatic Medical History: Negative for: Anemia; Hemophilia; Human Immunodeficiency Virus; Lymphedema; Sickle Cell Disease Respiratory Medical History: Positive for: Sleep Apnea Negative for: Aspiration; Asthma; Chronic Obstructive Pulmonary Disease (COPD); Pneumothorax; Tuberculosis Cardiovascular Medical History: Positive for: Congestive Heart Failure; Coronary Artery Disease; Hypertension Negative for: Angina; Arrhythmia; Deep Vein Thrombosis; Hypotension; Myocardial Infarction; Peripheral Arterial Disease; Peripheral Venous Disease; Phlebitis; Vasculitis Gastrointestinal Medical History: Negative for: Cirrhosis ; Colitis;  Crohns; Hepatitis A; Hepatitis B; Hepatitis C Endocrine Medical History: Negative for: Type I Diabetes; Type II Diabetes Genitourinary Medical History: Negative for: End Stage Renal Disease Immunological Medical History: Negative for: Lupus Erythematosus; Raynauds; Scleroderma Integumentary (Skin) Medical History: Negative for: History of Burn Musculoskeletal Medical History: Negative for: Gout; Rheumatoid Arthritis; Osteoarthritis; Osteomyelitis Neurologic Medical History: Negative for: Dementia; Neuropathy; Quadriplegia; Paraplegia; Seizure Disorder Oncologic Medical History: Negative for: Received Chemotherapy; Received Radiation Psychiatric Medical History: Negative for: Anorexia/bulimia; Confinement Anxiety Immunizations Pneumococcal Vaccine: Received Pneumococcal Vaccination: No Implantable Devices None Family and Social History Cancer: No; Diabetes: Yes - Mother,Maternal Grandparents; Heart Disease: Yes - Paternal Grandparents; Hereditary Spherocytosis: No; Hypertension: Yes - Mother,Maternal Grandparents; Kidney Disease: No; Lung Disease: Yes - Father,Paternal Grandparents; Seizures: No; Stroke: No; Thyroid Problems: No; Tuberculosis: No; Former smoker; Marital Status - Single; Alcohol Use: Never; Drug Use: No History; Caffeine Use: Daily; Financial Concerns: No; Food, Clothing or Shelter Needs: No; Support System Lacking: No; Transportation Concerns: No Electronic Signature(s) Signed: 10/27/2020 1:36:20 PM By: [name] DO Signed: 10/31/2020 7:10:16 PM By: Baruch Gouty RN, BSN Entered By: [name] on 10/27/2020 13:30:53 -------------------------------------------------------------------------------- SuperBill Details Patient Name: Date of Service: [name], Autumn D. 10/27/2020 Medical Record Number: [id number] Patient Account Number: [id number] Date of Birth/Sex: Treating RN: 09/23/1970 (50 y.o. [name] Primary Care Provider: [name] Other Clinician: Referring Provider: Treating Provider/Extender: Sammuel Bailiff in Treatment: 1 Diagnosis Coding ICD-10 Codes Code Description [phone] Non-pressure chronic ulcer of other  part of left lower leg with unspecified severity I87.2 Venous insufficiency (chronic) (peripheral) I89.0 Lymphedema, not elsewhere classified Z86.718 Personal history of other venous thrombosis and embolism Facility Procedures CPT4 Code: [id number] Description: [id number] - DEB SUBQ TISSUE 20 SQ CM/< ICD-10 Diagnosis Description L97.829 Non-pressure chronic ulcer of other part of left lower leg with unspecified sev Modifier: erity Quantity: 1 Physician Procedures Electronic Signature(s) Signed: 10/27/2020 1:36:20 PM By: [name] DO Previous Signature: 10/27/2020 1:24:03 PM Version By: Baruch Gouty RN, BSN Entered By: [name] on 10/27/2020 13:35:58

## 2020-11-02 ENCOUNTER — Encounter (HOSPITAL_BASED_OUTPATIENT_CLINIC_OR_DEPARTMENT_OTHER): Payer: Medicaid Other | Admitting: Internal Medicine

## 2020-11-03 ENCOUNTER — Other Ambulatory Visit: Payer: Self-pay

## 2020-11-03 ENCOUNTER — Encounter (HOSPITAL_BASED_OUTPATIENT_CLINIC_OR_DEPARTMENT_OTHER): Payer: Medicaid Other | Admitting: Internal Medicine

## 2020-11-03 DIAGNOSIS — L97929 Non-pressure chronic ulcer of unspecified part of left lower leg with unspecified severity: Secondary | ICD-10-CM | POA: Diagnosis not present

## 2020-11-03 NOTE — Progress Notes (Signed)
[name] ([id number]) Visit Report for 11/03/2020 Arrival Information Details Patient Name: Date of Service: [name] 11/03/2020 2:45 PM Medical Record Number: [id number] Patient Account Number: [id number] Date of Birth/Sex: Treating RN: 1970-06-13 (50 y.o. [name] Primary Care Yaneth Fairbairn: [name] Other Clinician: Referring Ikram Riebe: Treating Trever Streater/Extender: Sammuel Bailiff in Treatment: 2 Visit Information History Since Last Visit Added or deleted any medications: No Patient Arrived: Ambulatory Any new allergies or adverse reactions: No Arrival Time: 14:15 Had a fall or experienced change in No Transfer Assistance: None activities of daily living that may affect Patient Identification Verified: Yes risk of falls: Secondary Verification Process Completed: Yes Signs or symptoms of abuse/neglect since last visito No Patient Requires Transmission-Based Precautions: No Hospitalized since last visit: No Patient Has Alerts: Yes Implantable device outside of the clinic excluding No Patient Alerts: ABI's: 11/21 L:1.17 cellular tissue based products placed in the center since last visit: Has Dressing in Place as Prescribed: Yes Has Compression in Place as Prescribed: Yes Pain Present Now: No Electronic Signature(s) Signed: 11/03/2020 5:49:30 PM By: [name] Entered By: [name] on 11/03/2020 14:16:02 -------------------------------------------------------------------------------- Compression Therapy Details Patient Name: Date of Service: [name], [name] 11/03/2020 2:45 PM Medical Record Number: [id number] Patient Account Number: [id number] Date of Birth/Sex: Treating RN: [birth year] (50 y.o. [name] Primary Care Leani Myron: [name] Other Clinician: Referring Lakeithia Rasor: Treating Niles Ess/Extender: Sammuel Bailiff in Treatment: 2 Compression Therapy Performed for Wound  Assessment: Wound #4 Left,Medial Lower Leg Performed By: Clinician [name], RN Compression Type: Four Layer Electronic Signature(s) Signed: 11/03/2020 5:49:30 PM By: [name] Entered By: [name] on 11/03/2020 14:37:45 -------------------------------------------------------------------------------- Encounter Discharge Information Details Patient Name: Date of Service: Megan Session D. 11/03/2020 2:45 PM Medical Record Number: [id number] Patient Account Number: [id number] Date of Birth/Sex: Treating RN: March 05, 1971 (51 y.o. [name] Primary Care Saadiq Poche: [name] Other Clinician: Referring Larraine Argo: Treating Morrie Daywalt/Extender: Sammuel Bailiff in Treatment: 2 Encounter Discharge Information Items Discharge Condition: Stable Ambulatory Status: Ambulatory Discharge Destination: Home Transportation: Private Auto Schedule Follow-up Appointment: Yes Clinical Summary of Care: Provided on 11/03/2020 Form Type Recipient Paper Patient Patient Electronic Signature(s) Signed: 11/03/2020 5:49:30 PM By: [name] Entered By: [name] on 11/03/2020 14:38:34 -------------------------------------------------------------------------------- Patient/Caregiver Education Details Patient Name: Date of Service: Megan Session D. 7/8/2022andnbsp2:45 PM Medical Record Number: [id number] Patient Account Number: [id number] Date of Birth/Gender: Treating RN: 23-Feb-1971 (50 y.o. [name] Primary Care Physician: [name] Other Clinician: Referring Physician: Treating Physician/Extender: Sammuel Bailiff in Treatment: 2 Education Assessment Education Provided To: Patient Education Topics Provided Venous: Methods: Explain/Verbal, Printed Responses: State content correctly Wound/Skin Impairment: Methods: Demonstration, Explain/Verbal Responses: State content correctly Electronic  Signature(s) Signed: 11/03/2020 5:49:30 PM By: [name] Entered By: [name] on 11/03/2020 14:38:15 -------------------------------------------------------------------------------- Wound Assessment Details Patient Name: Date of Service: Megan Session D. 11/03/2020 2:45 PM Medical Record Number: [id number] Patient Account Number: [id number] Date of Birth/Sex: Treating RN: [birth year] (50 y.o. [name] Primary Care Shiesha Jahn: [name] Other Clinician: Referring Jaylynn Siefert: Treating Felicitas Sine/Extender: Sammuel Bailiff in Treatment: 2 Wound Status Wound Number: 4 Primary Venous Leg Ulcer Etiology: Wound Location: Left, Medial Lower Leg Wound Open Wounding Event: Gradually Appeared Status: Date Acquired: 09/26/2020 Comorbid Sleep Apnea, Congestive Heart Failure, Coronary Artery Weeks Of Treatment: 2 History: Disease, Hypertension Clustered Wound: No Wound Measurements Length: (cm) 4.6 Width: (cm) 3.1  Depth: (cm) 0.1 Area: (cm) 11.2 Volume: (cm) 1.12 % Reduction in Area: 31.2% % Reduction in Volume: 31.2% Epithelialization: Small (1-33%) Tunneling: No Undermining: No Wound Description Classification: Full Thickness Without Exposed Support Structures Wound Margin: Distinct, outline attached Exudate Amount: Medium Exudate Type: Serosanguineous Exudate Color: red, brown Foul Odor After Cleansing: No Slough/Fibrino No Wound Bed Granulation Amount: Large (67-100%) Exposed Structure Granulation Quality: Red, Pink Fascia Exposed: No Necrotic Amount: None Present (0%) Fat Layer (Subcutaneous Tissue) Exposed: Yes Tendon Exposed: No Muscle Exposed: No Joint Exposed: No Bone Exposed: No Treatment Notes Wound #4 (Lower Leg) Wound Laterality: Left, Medial Cleanser Soap and Water Discharge Instruction: May shower and wash wound with dial antibacterial soap and water prior to dressing change. Peri-Wound Care Topical Primary  Dressing Hydrofera Blue Ready Foam, 4x5 in Discharge Instruction: Apply to wound bed as instructed Santyl Ointment Discharge Instruction: Apply nickel thick amount to wound bed as instructed Secondary Dressing Woven Gauze Sponge, Non-Sterile 4x4 in Discharge Instruction: Apply over primary dressing as directed. ABD Pad, 8x10 Discharge Instruction: Apply over primary dressing as directed. CarboFLEX Odor Control Dressing, 4x4 in Discharge Instruction: Apply over primary dressing as directed. Secured With Compression Wrap FourPress (4 layer compression wrap) Discharge Instruction: Apply four layer compression as directed. May also use Miliken CoFlex 2 layer compression system as alternative. Unnaboot w/Calamine, 4x10 (in/yd) Discharge Instruction: unna at top of wrap to help secure Compression Stockings Add-Ons Electronic Signature(s) Signed: 11/03/2020 5:49:30 PM By: [name] Entered By: [name] on 11/03/2020 14:37:24 -------------------------------------------------------------------------------- Vitals Details Patient Name: Date of Service: [name], [name] 11/03/2020 2:45 PM Medical Record Number: [id number] Patient Account Number: [id number] Date of Birth/Sex: Treating RN: Jan 28, 1971 (51 y.o. [name] Primary Care Danetta Prom: [name] Other Clinician: Referring Avrum Kimball: Treating Aikam Vinje/Extender: Sammuel Bailiff in Treatment: 2 Vital Signs Time Taken: 14:15 Temperature (F): 97.5 Height (in): 67 Pulse (bpm): 83 Weight (lbs): 346 Respiratory Rate (breaths/min): 18 Body Mass Index (BMI): 54.2 Blood Pressure (mmHg): 133/84 Reference Range: 80 - 120 mg / dl Electronic Signature(s) Signed: 11/03/2020 5:49:30 PM By: [name] Entered By: [name] on 11/03/2020 14:18:03

## 2020-11-03 NOTE — Progress Notes (Signed)
[name], [name] ([id number]) Visit Report for 11/03/2020 SuperBill Details Patient Name: Date of Service: [name], [name] 11/03/2020 Medical Record Number: [id number] Patient Account Number: [id number] Date of Birth/Sex: Treating RN: June 03, 1970 (50 y.o. [name] Primary Care Provider: [name] Other Clinician: Referring Provider: Treating Provider/Extender: Sammuel Bailiff in Treatment: 2 Diagnosis Coding ICD-10 Codes Code Description [phone] Non-pressure chronic ulcer of other part of left lower leg with unspecified severity I87.2 Venous insufficiency (chronic) (peripheral) I89.0 Lymphedema, not elsewhere classified Z86.718 Personal history of other venous thrombosis and embolism Facility Procedures CPT4 Code Description Modifier Quantity [id number] (Facility Use Only) [phone] - [place] [id number] LWR LT LEG 1 ICD-10 Diagnosis Description L97.829 Non-pressure chronic ulcer of other part of left lower leg with unspecified severity I89.0 Lymphedema, not elsewhere classified Electronic Signature(s) Signed: 11/03/2020 4:56:18 PM By: [name] DO Signed: 11/03/2020 5:49:30 PM By: [name] Entered By: [name] on 11/03/2020 14:39:25

## 2020-11-06 ENCOUNTER — Ambulatory Visit: Payer: Medicaid Other | Admitting: Internal Medicine

## 2020-11-08 ENCOUNTER — Other Ambulatory Visit: Payer: Self-pay | Admitting: Internal Medicine

## 2020-11-08 DIAGNOSIS — F411 Generalized anxiety disorder: Secondary | ICD-10-CM

## 2020-11-09 ENCOUNTER — Encounter (HOSPITAL_BASED_OUTPATIENT_CLINIC_OR_DEPARTMENT_OTHER): Payer: Medicaid Other | Admitting: Internal Medicine

## 2020-11-09 ENCOUNTER — Other Ambulatory Visit: Payer: Self-pay

## 2020-11-09 DIAGNOSIS — L97829 Non-pressure chronic ulcer of other part of left lower leg with unspecified severity: Secondary | ICD-10-CM | POA: Diagnosis not present

## 2020-11-09 DIAGNOSIS — L97929 Non-pressure chronic ulcer of unspecified part of left lower leg with unspecified severity: Secondary | ICD-10-CM | POA: Diagnosis not present

## 2020-11-13 NOTE — Progress Notes (Signed)
[name] ([id number]) Visit Report for 11/09/2020 Chief Complaint Document Details Patient Name: Date of Service: [name], [name]. 11/09/2020 3:15 PM Medical Record Number: [id number] Patient Account Number: [id number] Date of Birth/Sex: Treating RN: [birth year] (50 y.o. [name], [name] Primary Care Provider: Karle [name] Other Clinician: Referring Provider: Treating Provider/Extender: [name] in Treatment: 2 Information Obtained from: Patient Chief Complaint Left lower extremity wound Electronic Signature(s) Signed: 11/10/2020 2:31:31 PM By: Megan Shan DO Entered By: [name] on 11/10/2020 14:28:38 -------------------------------------------------------------------------------- Debridement Details Patient Name: Date of Service: [name], [name]. 11/09/2020 3:15 PM Medical Record Number: [id number] Patient Account Number: [id number] Date of Birth/Sex: Treating RN: 04-08-1971 (50 y.o. [name], [name] Primary Care Provider: Karle [name] Other Clinician: Referring Provider: Treating Provider/Extender: [name] in Treatment: 2 Debridement Performed for Assessment: Wound #4 Left,Medial Lower Leg Performed By: Physician Megan Shan, DO Debridement Type: Debridement Severity of Tissue Pre Debridement: Fat layer exposed Level of Consciousness (Pre-procedure): Awake and Alert Pre-procedure Verification/Time Out Yes - 16:10 Taken: Start Time: 16:10 Pain Control: Lidocaine T Area Debrided (L x W): otal 3.5 (cm) x 2.6 (cm) = 9.1 (cm) Tissue and other material debrided: Viable, Non-Viable, Subcutaneous, Skin: Dermis , Skin: Epidermis Level: Skin/Subcutaneous Tissue Debridement Description: Excisional Instrument: Curette Bleeding: Minimum Hemostasis Achieved: Pressure End Time: 16:10 Procedural Pain: 0 Post Procedural Pain: 0 Response to Treatment: Procedure was tolerated  well Level of Consciousness (Post- Awake and Alert procedure): Post Debridement Measurements of Total Wound Length: (cm) 3.5 Width: (cm) 2.6 Depth: (cm) 0.1 Volume: (cm) 0.715 Character of Wound/Ulcer Post Debridement: Improved Severity of Tissue Post Debridement: Fat layer exposed Post Procedure Diagnosis Same as Pre-procedure Electronic Signature(s) Signed: 11/09/2020 6:01:49 PM By: Megan Hammock RN Signed: 11/10/2020 2:31:31 PM By: Megan Shan DO Entered By: [name] on 11/09/2020 16:10:57 -------------------------------------------------------------------------------- HPI Details Patient Name: Date of Service: [name], [name]. 11/09/2020 3:15 PM Medical Record Number: [id number] Patient Account Number: [id number] Date of Birth/Sex: Treating RN: Sep 28, 1970 (50 y.o. [name] Primary Care Provider: Karle [name] Other Clinician: Referring Provider: Treating Provider/Extender: [name] in Treatment: 2 History of Present Illness HPI Description: Admission 6/24 [name] is a 50 year old female with a past medical history of chronic venous insufficiency, lymphedema, DVT on anticoagulation and diastolic heart failure that presents to the clinic for left lower extremity wound. She was last seen 4 months ago in our clinic for the same issue. The reoccurring wound started at the end of May and has been going on for 1 month. She has been using an ointment and I am unclear what this is. She tries to keep her leg elevated with her compression stocking. She also reports she has lymphedema pumps and has been using them as well. She reports mild pain to the area. She denies signs of infection including increased warmth, erythema or purulent drainage. 7/1; patient presents for 1 week follow-up. She has tolerated the wrap well. Unfortunately she did have trouble with the wrap Sliding down her leg 2 days ago. She has no  issues or complaints today. She denies signs of infection. 7/15; patient presents for follow-up. She has tolerated the wrap well. She has no issues or complaints today. She denies signs of infection. Electronic Signature(s) Signed: 11/10/2020 2:31:31 PM By: Megan Shan DO Entered By: [name] on 11/10/2020 14:29:05 -------------------------------------------------------------------------------- Physical Exam Details Patient Name: Date of Service: [name], [name]. 11/09/2020  3:15 PM Medical Record Number: [id number] Patient Account Number: [id number] Date of Birth/Sex: Treating RN: 01-24-1971 (50 y.o. [name], [name] Primary Care Provider: Karle [name] Other Clinician: Referring Provider: Treating Provider/Extender: [name] in Treatment: 2 Constitutional respirations regular, non-labored and within target range for patient.. Cardiovascular 2+ dorsalis pedis/posterior tibialis pulses. Psychiatric pleasant and cooperative. Notes Left lower extremity: On the medial aspect there is an open wound with granulation tissue and fibrinous tissue present. No signs of infection. Good edema control. Electronic Signature(s) Signed: 11/10/2020 2:31:31 PM By: Megan Shan DO Entered By: [name] on 11/10/2020 14:29:40 -------------------------------------------------------------------------------- Physician Orders Details Patient Name: Date of Service: [name], [name]. 11/09/2020 3:15 PM Medical Record Number: [id number] Patient Account Number: [id number] Date of Birth/Sex: Treating RN: April 05, 1971 (50 y.o. [name], [name] Primary Care Provider: Karle [name] Other Clinician: Referring Provider: Treating Provider/Extender: [name] in Treatment: 2 Verbal / Phone Orders: No Diagnosis Coding ICD-10 Coding Code Description [phone] Non-pressure chronic ulcer of other part of left lower leg  with unspecified severity I87.2 Venous insufficiency (chronic) (peripheral) I89.0 Lymphedema, not elsewhere classified Z86.718 Personal history of other venous thrombosis and embolism Follow-up Appointments ppointment in 1 week. - Dr. Heber  Return A Other: - Be sure you go to your follow up appt. with Vascular on 08/11 Bathing/ Shower/ Hygiene May shower with protection but do not get wound dressing(s) wet. - use cast protector to keep wrap dry in the shower Edema Control - Lymphedema / SCD / Other Bilateral Lower Extremities Lymphedema Pumps. Use Lymphedema pumps on leg(s) 2-3 times a day for 45-60 minutes. If wearing any wraps or hose, do not remove them. Continue exercising as instructed. Elevate legs to the level of the heart or above for 30 minutes daily and/or when sitting, a frequency of: Avoid standing for long periods of time. Patient to wear own compression stockings every day. - right leg daily Exercise regularly Wound Treatment Wound #4 - Lower Leg Wound Laterality: Left, Medial Cleanser: Soap and Water 1 x Per Week/15 Days Discharge Instructions: May shower and wash wound with dial antibacterial soap and water prior to dressing change. Prim Dressing: Hydrofera Blue Ready Foam, 4x5 in 1 x Per Week/15 Days ary Discharge Instructions: Apply to wound bed as instructed Prim Dressing: Santyl Ointment 1 x Per Week/15 Days ary Discharge Instructions: Apply nickel thick amount to wound bed as instructed Secondary Dressing: Woven Gauze Sponge, Non-Sterile 4x4 in 1 x Per Week/15 Days Discharge Instructions: Apply over primary dressing as directed. Secondary Dressing: ABD Pad, 8x10 1 x Per Week/15 Days Discharge Instructions: Apply over primary dressing as directed. Secondary Dressing: CarboFLEX Odor Control Dressing, 4x4 in 1 x Per Week/15 Days Discharge Instructions: Apply over primary dressing as directed. Compression Wrap: FourPress (4 layer compression wrap) 1 x Per Week/15  Days Discharge Instructions: Apply four layer compression as directed. May also use Miliken CoFlex 2 layer compression system as alternative. Compression Wrap: Unnaboot w/Calamine, 4x10 (in/yd) 1 x Per Week/15 Days Discharge Instructions: unna at top of wrap to help secure Electronic Signature(s) Signed: 11/10/2020 2:31:31 PM By: Megan Shan DO Previous Signature: 11/09/2020 6:01:49 PM Version By: Megan Hammock RN Entered By: [name] on 11/10/2020 14:29:59 -------------------------------------------------------------------------------- Problem List Details Patient Name: Date of Service: [name], [name]. 11/09/2020 3:15 PM Medical Record Number: [id number] Patient Account Number: [id number] Date of Birth/Sex: Treating RN: [birth year] (50 y.o. [name], [name] Primary Care Provider: Karle [name] Other Clinician: Referring Provider:  Treating Provider/Extender: [name] in Treatment: 2 Active Problems ICD-10 Encounter Code Description Active Date MDM Diagnosis L97.829 Non-pressure chronic ulcer of other part of left lower leg with unspecified 10/20/2020 No Yes severity I87.2 Venous insufficiency (chronic) (peripheral) 10/20/2020 No Yes I89.0 Lymphedema, not elsewhere classified 10/20/2020 No Yes Z86.718 Personal history of other venous thrombosis and embolism 10/20/2020 No Yes Inactive Problems Resolved Problems Electronic Signature(s) Signed: 11/10/2020 2:31:31 PM By: Megan Shan DO Entered By: [name] on 11/10/2020 14:28:18 -------------------------------------------------------------------------------- Progress Note Details Patient Name: Date of Service: [name], [name]. 11/09/2020 3:15 PM Medical Record Number: [id number] Patient Account Number: [id number] Date of Birth/Sex: Treating RN: 06-30-1970 (49 y.o. [name], [name] Primary Care Provider: Karle [name] Other Clinician: Referring  Provider: Treating Provider/Extender: [name] in Treatment: 2 Subjective Chief Complaint Information obtained from Patient Left lower extremity wound History of Present Illness (HPI) Admission 6/24 Ms. Trinisha Rojek is a 50 year old female with a past medical history of chronic venous insufficiency, lymphedema, DVT on anticoagulation and diastolic heart failure that presents to the clinic for left lower extremity wound. She was last seen 4 months ago in our clinic for the same issue. The reoccurring wound started at the end of May and has been going on for 1 month. She has been using an ointment and I am unclear what this is. She tries to keep her leg elevated with her compression stocking. She also reports she has lymphedema pumps and has been using them as well. She reports mild pain to the area. She denies signs of infection including increased warmth, erythema or purulent drainage. 7/1; patient presents for 1 week follow-up. She has tolerated the wrap well. Unfortunately she did have trouble with the wrap Sliding down her leg 2 days ago. She has no issues or complaints today. She denies signs of infection. 7/15; patient presents for follow-up. She has tolerated the wrap well. She has no issues or complaints today. She denies signs of infection. Patient History Information obtained from Patient. Family History Diabetes - Mother,Maternal Grandparents, Heart Disease - Paternal Grandparents, Hypertension - Mother,Maternal Grandparents, Lung Disease - Father,Paternal Grandparents, No family history of Cancer, Hereditary Spherocytosis, Kidney Disease, Seizures, Stroke, Thyroid Problems, Tuberculosis. Social History Former smoker, Marital Status - Single, Alcohol Use - Never, Drug Use - No History, Caffeine Use - Daily. Medical History Eyes Denies history of Cataracts, Glaucoma, Optic Neuritis Ear/Nose/Mouth/Throat Denies history of Chronic sinus  problems/congestion, Middle ear problems Hematologic/Lymphatic Denies history of Anemia, Hemophilia, Human Immunodeficiency Virus, Lymphedema, Sickle Cell Disease Respiratory Patient has history of Sleep Apnea Denies history of Aspiration, Asthma, Chronic Obstructive Pulmonary Disease (COPD), Pneumothorax, Tuberculosis Cardiovascular Patient has history of Congestive Heart Failure, Coronary Artery Disease, Hypertension Denies history of Angina, Arrhythmia, Deep Vein Thrombosis, Hypotension, Myocardial Infarction, Peripheral Arterial Disease, Peripheral Venous Disease, Phlebitis, Vasculitis Gastrointestinal Denies history of Cirrhosis , Colitis, Crohnoos, Hepatitis A, Hepatitis B, Hepatitis C Endocrine Denies history of Type I Diabetes, Type II Diabetes Genitourinary Denies history of End Stage Renal Disease Immunological Denies history of Lupus Erythematosus, Raynaudoos, Scleroderma Integumentary (Skin) Denies history of History of Burn Musculoskeletal Denies history of Gout, Rheumatoid Arthritis, Osteoarthritis, Osteomyelitis Neurologic Denies history of Dementia, Neuropathy, Quadriplegia, Paraplegia, Seizure Disorder Oncologic Denies history of Received Chemotherapy, Received Radiation Psychiatric Denies history of Anorexia/bulimia, Confinement Anxiety Objective Constitutional respirations regular, non-labored and within target range for patient.. Vitals Time Taken: 3:47 PM, Height: 67 in, Weight: 346 lbs, BMI: 54.2, Temperature: 98.5 F, Pulse:  120 bpm, Respiratory Rate: 18 breaths/min, Blood Pressure: 123/78 mmHg. Cardiovascular 2+ dorsalis pedis/posterior tibialis pulses. Psychiatric pleasant and cooperative. General Notes: Left lower extremity: On the medial aspect there is an open wound with granulation tissue and fibrinous tissue present. No signs of infection. Good edema control. Integumentary (Hair, Skin) Wound #4 status is Open. Original cause of wound was  Gradually Appeared. The date acquired was: 09/26/2020. The wound has been in treatment 2 weeks. The wound is located on the Left,Medial Lower Leg. The wound measures 3.5cm length x 2.6cm width x 0.1cm depth; 7.147cm^2 area and 0.715cm^3 volume. There is Fat Layer (Subcutaneous Tissue) exposed. There is no tunneling or undermining noted. There is a medium amount of serosanguineous drainage noted. The wound margin is distinct with the outline attached to the wound base. There is large (67-100%) red, pink granulation within the wound bed. There is no necrotic tissue within the wound bed. Assessment Active Problems ICD-10 Non-pressure chronic ulcer of other part of left lower leg with unspecified severity Venous insufficiency (chronic) (peripheral) Lymphedema, not elsewhere classified Personal history of other venous thrombosis and embolism Patient has shown improvement in size and appearance since last clinic visit. There is nonviable tissue and I debrided this. I think she would continue to benefit from [facility] under 4-layer compression. There were no signs of infection on exam. I will see her back in 1 week. Procedures Wound #4 Pre-procedure diagnosis of Wound #4 is a Venous Leg Ulcer located on the Left,Medial Lower Leg .Severity of Tissue Pre Debridement is: Fat layer exposed. There was a Excisional Skin/Subcutaneous Tissue Debridement with a total area of 9.1 sq cm performed by Megan Shan, DO. With the following instrument(s): Curette to remove Viable and Non-Viable tissue/material. Material removed includes Subcutaneous Tissue, Skin: Dermis, and Skin: Epidermis after achieving pain control using Lidocaine. No specimens were taken. A time out was conducted at 16:10, prior to the start of the procedure. A Minimum amount of bleeding was controlled with Pressure. The procedure was tolerated well with a pain level of 0 throughout and a pain level of 0 following the procedure.  Post Debridement Measurements: 3.5cm length x 2.6cm width x 0.1cm depth; 0.715cm^3 volume. Character of Wound/Ulcer Post Debridement is improved. Severity of Tissue Post Debridement is: Fat layer exposed. Post procedure Diagnosis Wound #4: Same as Pre-Procedure Pre-procedure diagnosis of Wound #4 is a Venous Leg Ulcer located on the Left,Medial Lower Leg . There was a Four Layer Compression Therapy Procedure by Megan Hammock, RN. Post procedure Diagnosis Wound #4: Same as Pre-Procedure Plan Follow-up Appointments: Return Appointment in 1 week. - Dr. Heber Slaughter Other: - Be sure you go to your follow up appt. with Vascular on 08/11 Bathing/ Shower/ Hygiene: May shower with protection but do not get wound dressing(s) wet. - use cast protector to keep wrap dry in the shower Edema Control - Lymphedema / SCD / Other: Lymphedema Pumps. Use Lymphedema pumps on leg(s) 2-3 times a day for 45-60 minutes. If wearing any wraps or hose, do not remove them. Continue exercising as instructed. Elevate legs to the level of the heart or above for 30 minutes daily and/or when sitting, a frequency of: Avoid standing for long periods of time. Patient to wear own compression stockings every day. - right leg daily Exercise regularly WOUND #4: - Lower Leg Wound Laterality: Left, Medial Cleanser: Soap and Water 1 x Per Week/15 Days Discharge Instructions: May shower and wash wound with dial antibacterial soap and water prior to  dressing change. Prim Dressing: Hydrofera Blue Ready Foam, 4x5 in 1 x Per Week/15 Days ary Discharge Instructions: Apply to wound bed as instructed Prim Dressing: Santyl Ointment 1 x Per Week/15 Days ary Discharge Instructions: Apply nickel thick amount to wound bed as instructed Secondary Dressing: Woven Gauze Sponge, Non-Sterile 4x4 in 1 x Per Week/15 Days Discharge Instructions: Apply over primary dressing as directed. Secondary Dressing: ABD Pad, 8x10 1 x Per Week/15 Days Discharge  Instructions: Apply over primary dressing as directed. Secondary Dressing: CarboFLEX Odor Control Dressing, 4x4 in 1 x Per Week/15 Days Discharge Instructions: Apply over primary dressing as directed. Com pression Wrap: FourPress (4 layer compression wrap) 1 x Per Week/15 Days Discharge Instructions: Apply four layer compression as directed. May also use Miliken CoFlex 2 layer compression system as alternative. Com pression Wrap: Unnaboot w/Calamine, 4x10 (in/yd) 1 x Per Week/15 Days Discharge Instructions: unna at top of wrap to help secure 1. In office sharp debridement 2. Hydrofera Blue and Santyl under 4-layer compression 3. Follow-up in 1 week Electronic Signature(s) Signed: 11/10/2020 2:31:31 PM By: Megan Shan DO Entered By: [name] on 11/10/2020 14:30:58 -------------------------------------------------------------------------------- HxROS Details Patient Name: Date of Service: [name], [name]. 11/09/2020 3:15 PM Medical Record Number: [id number] Patient Account Number: [id number] Date of Birth/Sex: Treating RN: [birth year] (50 y.o. [name], [name] Primary Care Provider: Karle [name] Other Clinician: Referring Provider: Treating Provider/Extender: [name] in Treatment: 2 Information Obtained From Patient Eyes Medical History: Negative for: Cataracts; Glaucoma; Optic Neuritis Ear/Nose/Mouth/Throat Medical History: Negative for: Chronic sinus problems/congestion; Middle ear problems Hematologic/Lymphatic Medical History: Negative for: Anemia; Hemophilia; Human Immunodeficiency Virus; Lymphedema; Sickle Cell Disease Respiratory Medical History: Positive for: Sleep Apnea Negative for: Aspiration; Asthma; Chronic Obstructive Pulmonary Disease (COPD); Pneumothorax; Tuberculosis Cardiovascular Medical History: Positive for: Congestive Heart Failure; Coronary Artery Disease; Hypertension Negative for: Angina;  Arrhythmia; Deep Vein Thrombosis; Hypotension; Myocardial Infarction; Peripheral Arterial Disease; Peripheral Venous Disease; Phlebitis; Vasculitis Gastrointestinal Medical History: Negative for: Cirrhosis ; Colitis; Crohns; Hepatitis A; Hepatitis B; Hepatitis C Endocrine Medical History: Negative for: Type I Diabetes; Type II Diabetes Genitourinary Medical History: Negative for: End Stage Renal Disease Immunological Medical History: Negative for: Lupus Erythematosus; Raynauds; Scleroderma Integumentary (Skin) Medical History: Negative for: History of Burn Musculoskeletal Medical History: Negative for: Gout; Rheumatoid Arthritis; Osteoarthritis; Osteomyelitis Neurologic Medical History: Negative for: Dementia; Neuropathy; Quadriplegia; Paraplegia; Seizure Disorder Oncologic Medical History: Negative for: Received Chemotherapy; Received Radiation Psychiatric Medical History: Negative for: Anorexia/bulimia; Confinement Anxiety Immunizations Pneumococcal Vaccine: Received Pneumococcal Vaccination: No Implantable Devices None Family and Social History Cancer: No; Diabetes: Yes - Mother,Maternal Grandparents; Heart Disease: Yes - Paternal Grandparents; Hereditary Spherocytosis: No; Hypertension: Yes - Mother,Maternal Grandparents; Kidney Disease: No; Lung Disease: Yes - Father,Paternal Grandparents; Seizures: No; Stroke: No; Thyroid Problems: No; Tuberculosis: No; Former smoker; Marital Status - Single; Alcohol Use: Never; Drug Use: No History; Caffeine Use: Daily; Financial Concerns: No; Food, Clothing or Shelter Needs: No; Support System Lacking: No; Transportation Concerns: No Electronic Signature(s) Signed: 11/10/2020 2:31:31 PM By: Megan Shan DO Signed: 11/13/2020 5:08:20 PM By: Megan Hammock RN Entered By: [name] on 11/10/2020 14:29:17 -------------------------------------------------------------------------------- SuperBill Details Patient Name: Date  of Service: [name], [name]. 11/09/2020 Medical Record Number: [id number] Patient Account Number: [id number] Date of Birth/Sex: Treating RN: [birth year] (50 y.o. [name], [name] Primary Care Provider: Karle [name] Other Clinician: Referring Provider: Treating Provider/Extender: [name] in Treatment: 2 Diagnosis Coding ICD-10 Codes Code Description [phone] Non-pressure chronic ulcer  of other part of left lower leg with unspecified severity I87.2 Venous insufficiency (chronic) (peripheral) I89.0 Lymphedema, not elsewhere classified Z86.718 Personal history of other venous thrombosis and embolism Facility Procedures Physician Procedures : CPT4 Code Description Modifier [id number] - WC PHYS SUBQ TISS 20 SQ CM ICD-10 Diagnosis Description L97.829 Non-pressure chronic ulcer of other part of left lower leg with unspecified severity Quantity: 1 Electronic Signature(s) Signed: 11/10/2020 2:31:31 PM By: Megan Shan DO Previous Signature: 11/09/2020 6:01:49 PM Version By: Megan Hammock RN Entered By: [name] on 11/10/2020 14:31:06

## 2020-11-13 NOTE — Progress Notes (Signed)
SHLOKA, RIFF ([id number]) Visit Report for 11/09/2020 Arrival Information Details Patient Name: Date of Service: [name], [name] 11/09/2020 3:15 PM Medical Record Number: [id number] Patient Account Number: [id number] Date of Birth/Sex: Treating RN: 12/17/1970 (50 y.o. [name], [name] Primary Care Rider Ermis: [name] Other Clinician: Referring Seairra Otani: Treating Krystie Leiter/Extender: Sammuel Bailiff in Treatment: 2 Visit Information History Since Last Visit Added or deleted any medications: No Patient Arrived: Ambulatory Any new allergies or adverse reactions: No Arrival Time: 15:46 Had a fall or experienced change in No Accompanied By: self activities of daily living that may affect Transfer Assistance: None risk of falls: Patient Identification Verified: Yes Signs or symptoms of abuse/neglect since last visito No Secondary Verification Process Completed: Yes Hospitalized since last visit: No Patient Requires Transmission-Based Precautions: No Implantable device outside of the clinic excluding No Patient Has Alerts: Yes cellular tissue based products placed in the center Patient Alerts: ABI's: 11/21 L:1.17 since last visit: Has Dressing in Place as Prescribed: Yes Pain Present Now: No Electronic Signature(s) Signed: 11/13/2020 3:49:49 PM By: Sandre Kitty Entered By: Sandre Kitty on 11/09/2020 15:47:15 -------------------------------------------------------------------------------- Compression Therapy Details Patient Name: Date of Service: Megan Session D. 11/09/2020 3:15 PM Medical Record Number: [id number] Patient Account Number: [id number] Date of Birth/Sex: Treating RN: 12/18/1970 (50 y.o. [name], [name] Primary Care Chelsie Burel: [name] Other Clinician: Referring Glenis Musolf: Treating Shalice Woodring/Extender: Sammuel Bailiff in Treatment: 2 Compression Therapy Performed for Wound  Assessment: Wound #4 Left,Medial Lower Leg Performed By: Clinician Rhae Hammock, RN Compression Type: Four Layer Post Procedure Diagnosis Same as Pre-procedure Electronic Signature(s) Signed: 11/09/2020 6:01:49 PM By: Rhae Hammock RN Entered By: Rhae Hammock on 11/09/2020 16:24:45 -------------------------------------------------------------------------------- Lower Extremity Assessment Details Patient Name: Date of Service: Megan Session D. 11/09/2020 3:15 PM Medical Record Number: [id number] Patient Account Number: [id number] Date of Birth/Sex: Treating RN: [birth year] (50 y.o. [name], [name] Primary Care Katura Eatherly: [name] Other Clinician: Referring [place] Dodgen: Treating Alauna Hayden/Extender: Sammuel Bailiff in Treatment: 2 Edema Assessment Assessed: [name]: Yes] [name]: No] Edema: [Left: Ye] [Right: s] Calf Left: Right: Point of Measurement: 25.1 cm From Medial Instep 50.5 cm Ankle Left: Right: Point of Measurement: 9 cm From Medial Instep 30 cm Vascular Assessment Pulses: Dorsalis Pedis Palpable: [Left:Yes] Posterior Tibial Palpable: [Left:Yes] Electronic Signature(s) Signed: 11/09/2020 6:01:49 PM By: Rhae Hammock RN Entered By: Rhae Hammock on 11/09/2020 16:04:50 -------------------------------------------------------------------------------- Multi Wound Chart Details Patient Name: Date of Service: [name], [name] 11/09/2020 3:15 PM Medical Record Number: [id number] Patient Account Number: [id number] Date of Birth/Sex: Treating RN: 09/11/1970 (50 y.o. [name], [name] Primary Care Wanna Gully: [name] Other Clinician: Referring Barbarajean Kinzler: Treating Marcha Licklider/Extender: Sammuel Bailiff in Treatment: 2 Vital Signs Height(in): 67 Pulse(bpm): 120 Weight(lbs): 346 Blood Pressure(mmHg): 123/78 Body Mass Index(BMI): 54 Temperature(F): 98.5 Respiratory  Rate(breaths/min): 18 Photos: [N/A:N/A] Left, Medial Lower Leg N/A N/A Wound Location: Gradually Appeared N/A N/A Wounding Event: Venous Leg Ulcer N/A N/A Primary Etiology: Sleep Apnea, Congestive Heart N/A N/A Comorbid History: Failure, Coronary Artery Disease, Hypertension 09/26/2020 N/A N/A Date Acquired: 2 N/A N/A Weeks of Treatment: Open N/A N/A Wound Status: 3.5x2.6x0.1 N/A N/A Measurements L x W x D (cm) 7.147 N/A N/A A (cm) : rea 0.715 N/A N/A Volume (cm) : 56.10% N/A N/A % Reduction in A rea: 56.10% N/A N/A % Reduction in Volume: Full Thickness Without Exposed N/A N/A Classification: Support Structures Medium N/A N/A Exudate A mount: Serosanguineous N/A  N/A Exudate Type: red, brown N/A N/A Exudate Color: Distinct, outline attached N/A N/A Wound Margin: Large (67-100%) N/A N/A Granulation A mount: Red, Pink N/A N/A Granulation Quality: None Present (0%) N/A N/A Necrotic A mount: Fat Layer (Subcutaneous Tissue): Yes N/A N/A Exposed Structures: Fascia: No Tendon: No Muscle: No Joint: No Bone: No Small (1-33%) N/A N/A Epithelialization: Debridement - Excisional N/A N/A Debridement: Pre-procedure Verification/Time Out 16:10 N/A N/A Taken: Lidocaine N/A N/A Pain Control: Subcutaneous N/A N/A Tissue Debrided: Skin/Subcutaneous Tissue N/A N/A Level: 9.1 N/A N/A Debridement A (sq cm): rea Curette N/A N/A Instrument: Minimum N/A N/A Bleeding: Pressure N/A N/A Hemostasis A chieved: 0 N/A N/A Procedural Pain: 0 N/A N/A Post Procedural Pain: Procedure was tolerated well N/A N/A Debridement Treatment Response: 3.5x2.6x0.1 N/A N/A Post Debridement Measurements L x W x D (cm) 0.715 N/A N/A Post Debridement Volume: (cm) Compression Therapy N/A N/A Procedures Performed: Debridement Treatment Notes Electronic Signature(s) Signed: 11/10/2020 2:31:31 PM By: [name] DO Signed: 11/13/2020 5:08:20 PM By: Rhae Hammock  RN Entered By: [name] on 11/10/2020 14:28:24 -------------------------------------------------------------------------------- Multi-Disciplinary Care Plan Details Patient Name: Date of Service: [name], [name] 11/09/2020 3:15 PM Medical Record Number: [id number] Patient Account Number: [id number] Date of Birth/Sex: Treating RN: August 02, 1970 (50 y.o. [name], [name] Primary Care Takirah Binford: [name] Other Clinician: Referring Scarleth Brame: Treating Yuriy Cui/Extender: Sammuel Bailiff in Treatment: 2 Multidisciplinary Care Plan reviewed with physician Active Inactive Venous Leg Ulcer Nursing Diagnoses: Actual venous Insuffiency (use after diagnosis is confirmed) Knowledge deficit related to disease process and management Potential for venous Insuffiency (use before diagnosis confirmed) Goals: Patient will maintain optimal edema control Date Initiated: 10/20/2020 Target Resolution Date: 11/17/2020 Goal Status: Active Interventions: Assess peripheral edema status every visit. Compression as ordered Treatment Activities: Therapeutic compression applied : 10/20/2020 Notes: Wound/Skin Impairment Nursing Diagnoses: Impaired tissue integrity Knowledge deficit related to ulceration/compromised skin integrity Goals: Patient/caregiver will verbalize understanding of skin care regimen Date Initiated: 10/20/2020 Target Resolution Date: 11/17/2020 Goal Status: Active Ulcer/skin breakdown will have a volume reduction of 30% by week 4 Date Initiated: 10/20/2020 Target Resolution Date: 11/17/2020 Goal Status: Active Interventions: Assess patient/caregiver ability to obtain necessary supplies Assess patient/caregiver ability to perform ulcer/skin care regimen upon admission and as needed Assess ulceration(s) every visit Provide education on ulcer and skin care Treatment Activities: Skin care regimen initiated : 10/20/2020 Topical wound management  initiated : 10/20/2020 Notes: Electronic Signature(s) Signed: 11/09/2020 6:01:49 PM By: Rhae Hammock RN Entered By: Rhae Hammock on 11/09/2020 16:22:23 -------------------------------------------------------------------------------- Pain Assessment Details Patient Name: Date of Service: [name], [name] 11/09/2020 3:15 PM Medical Record Number: [id number] Patient Account Number: [id number] Date of Birth/Sex: Treating RN: January 21, 1971 (50 y.o. [name], [name] Primary Care Saurav Crumble: [name] Other Clinician: Referring Celvin Taney: Treating Carrington Olazabal/Extender: Sammuel Bailiff in Treatment: 2 Active Problems Location of Pain Severity and Description of Pain Patient Has Paino No Site Locations Pain Management and Medication Current Pain Management: Electronic Signature(s) Signed: 11/09/2020 6:01:49 PM By: Rhae Hammock RN Signed: 11/13/2020 3:49:49 PM By: Sandre Kitty Entered By: Sandre Kitty on 11/09/2020 15:47:36 -------------------------------------------------------------------------------- Patient/Caregiver Education Details Patient Name: Date of Service: Megan Session D. 7/14/2022andnbsp3:15 PM Medical Record Number: [id number] Patient Account Number: [id number] Date of Birth/Gender: Treating RN: [birth year] (50 y.o. [name] Primary Care Physician: [name] Other Clinician: Referring Physician: Treating Physician/Extender: Sammuel Bailiff in Treatment: 2 Education Assessment Education Provided To: Patient Education Topics Provided Wound/Skin Impairment:  Methods: Explain/Verbal Responses: State content correctly Electronic Signature(s) Signed: 11/09/2020 6:01:49 PM By: Rhae Hammock RN Entered By: Rhae Hammock on 11/09/2020 16:22:43 -------------------------------------------------------------------------------- Wound Assessment Details Patient Name: Date  of Service: [name], [name] 11/09/2020 3:15 PM Medical Record Number: [id number] Patient Account Number: [id number] Date of Birth/Sex: Treating RN: 1970-05-11 (50 y.o. [name], [name] Primary Care Jahking Lesser: [name] Other Clinician: Referring Kalyan Barabas: Treating Abbe Bula/Extender: Sammuel Bailiff in Treatment: 2 Wound Status Wound Number: 4 Primary Venous Leg Ulcer Etiology: Wound Location: Left, Medial Lower Leg Wound Open Wounding Event: Gradually Appeared Status: Date Acquired: 09/26/2020 Comorbid Sleep Apnea, Congestive Heart Failure, Coronary Artery Weeks Of Treatment: 2 History: Disease, Hypertension Clustered Wound: No Photos Wound Measurements Length: (cm) 3.5 Width: (cm) 2.6 Depth: (cm) 0.1 Area: (cm) 7.147 Volume: (cm) 0.715 % Reduction in Area: 56.1% % Reduction in Volume: 56.1% Epithelialization: Small (1-33%) Tunneling: No Undermining: No Wound Description Classification: Full Thickness Without Exposed Support Structures Wound Margin: Distinct, outline attached Exudate Amount: Medium Exudate Type: Serosanguineous Exudate Color: red, brown Foul Odor After Cleansing: No Slough/Fibrino No Wound Bed Granulation Amount: Large (67-100%) Exposed Structure Granulation Quality: Red, Pink Fascia Exposed: No Necrotic Amount: None Present (0%) Fat Layer (Subcutaneous Tissue) Exposed: Yes Tendon Exposed: No Muscle Exposed: No Joint Exposed: No Bone Exposed: No Electronic Signature(s) Signed: 11/13/2020 3:49:49 PM By: Sandre Kitty Signed: 11/13/2020 5:08:20 PM By: Rhae Hammock RN Previous Signature: 11/09/2020 6:01:49 PM Version By: Rhae Hammock RN Entered By: Sandre Kitty on 11/10/2020 08:12:31 -------------------------------------------------------------------------------- Vitals Details Patient Name: Date of Service: [name], [name] 11/09/2020 3:15 PM Medical Record Number: [id number] Patient  Account Number: [id number] Date of Birth/Sex: Treating RN: 1971-03-07 (50 y.o. [name], [name] Primary Care Hamad Whyte: [name] Other Clinician: Referring Meya Clutter: Treating Joao Mccurdy/Extender: Sammuel Bailiff in Treatment: 2 Vital Signs Time Taken: 15:47 Temperature (F): 98.5 Height (in): 67 Pulse (bpm): 120 Weight (lbs): 346 Respiratory Rate (breaths/min): 18 Body Mass Index (BMI): 54.2 Blood Pressure (mmHg): 123/78 Reference Range: 80 - 120 mg / dl Electronic Signature(s) Signed: 11/13/2020 3:49:49 PM By: Sandre Kitty Entered By: Sandre Kitty on 11/09/2020 15:47:29

## 2020-11-16 ENCOUNTER — Other Ambulatory Visit: Payer: Self-pay

## 2020-11-16 ENCOUNTER — Encounter (HOSPITAL_BASED_OUTPATIENT_CLINIC_OR_DEPARTMENT_OTHER): Payer: Medicaid Other | Admitting: Internal Medicine

## 2020-11-16 DIAGNOSIS — L97929 Non-pressure chronic ulcer of unspecified part of left lower leg with unspecified severity: Secondary | ICD-10-CM | POA: Diagnosis not present

## 2020-11-20 NOTE — Progress Notes (Addendum)
[name] ([id number]) Visit Report for 11/16/2020 Arrival Information Details Patient Name: Date of Service: [name], [name] 11/16/2020 3:30 PM Medical Record Number: [id number] Patient Account Number: [id number] Date of Birth/Sex: Treating RN: Aug 02, 1970 (50 y.o. [name], Megan Primary Care Cloy Cozzens: [name] Other Clinician: Referring Abundio Teuscher: Treating Kolina Kube/Extender: Sammuel Bailiff in Treatment: 3 Visit Information History Since Last Visit Added or deleted any medications: No Patient Arrived: Ambulatory Any new allergies or adverse reactions: No Arrival Time: 16:43 Had a fall or experienced change in No Accompanied By: Self activities of daily living that may affect Transfer Assistance: None risk of falls: Patient Identification Verified: Yes Signs or symptoms of abuse/neglect since last visito No Secondary Verification Process Completed: Yes Hospitalized since last visit: No Patient Requires Transmission-Based Precautions: No Implantable device outside of the clinic excluding No Patient Has Alerts: Yes cellular tissue based products placed in the center Patient Alerts: ABI's: 11/21 L:1.17 since last visit: Has Dressing in Place as Prescribed: Yes Pain Present Now: No Electronic Signature(s) Signed: 11/17/2020 10:49:45 AM By: Sandre Kitty Entered By: Sandre Kitty on 11/16/2020 16:46:03 -------------------------------------------------------------------------------- Compression Therapy Details Patient Name: Date of Service: [name] 11/16/2020 3:30 PM Medical Record Number: [id number] Patient Account Number: [id number] Date of Birth/Sex: Treating RN: Dec 19, 1970 (50 y.o. [name] Primary Care Rina Adney: [name] Other Clinician: Referring Reza Crymes: Treating Kyelle Urbas/Extender: Sammuel Bailiff in Treatment: 3 Compression Therapy Performed for Wound Assessment:  Wound #4 Left,Medial Lower Leg Performed By: Clinician Levan Hurst, RN Compression Type: Four Layer Post Procedure Diagnosis Same as Pre-procedure Electronic Signature(s) Signed: 11/20/2020 4:44:07 PM By: Levan Hurst RN, BSN Entered By: Levan Hurst on 11/16/2020 17:12:31 -------------------------------------------------------------------------------- Encounter Discharge Information Details Patient Name: Date of Service: [name] 11/16/2020 3:30 PM Medical Record Number: [id number] Patient Account Number: [id number] Date of Birth/Sex: Treating RN: 24-Jan-1971 (50 y.o. [name] Primary Care Westley Blass: [name] Other Clinician: Referring Damiah Mcdonald: Treating Irvin Lizama/Extender: Sammuel Bailiff in Treatment: 3 Encounter Discharge Information Items Discharge Condition: Stable Ambulatory Status: Ambulatory Discharge Destination: Home Transportation: Private Auto Accompanied By: alone Schedule Follow-up Appointment: Yes Clinical Summary of Care: Patient Declined Electronic Signature(s) Signed: 11/20/2020 4:44:07 PM By: Levan Hurst RN, BSN Entered By: Levan Hurst on 11/16/2020 17:25:09 -------------------------------------------------------------------------------- Multi Wound Chart Details Patient Name: Date of Service: [name] 11/16/2020 3:30 PM Medical Record Number: [id number] Patient Account Number: [id number] Date of Birth/Sex: Treating RN: [birth year] (50 y.o. [name], Megan Primary Care [name]: [name] Other Clinician: Referring [name]: Treating Rupert Azzara/Extender: Sammuel Bailiff in Treatment: 3 Vital Signs Height(in): 67 Pulse(bpm): 71 Weight(lbs): 346 Blood Pressure(mmHg): 130/74 Body Mass Index(BMI): 54 Temperature(F): 97.7 Respiratory Rate(breaths/min): 18 Photos: [N/A:N/A] Left, Medial Lower Leg N/A N/A Wound Location: Gradually Appeared N/A  N/A Wounding Event: Venous Leg Ulcer N/A N/A Primary Etiology: Sleep Apnea, Congestive Heart N/A N/A Comorbid History: Failure, Coronary Artery Disease, Hypertension 09/26/2020 N/A N/A Date Acquired: 3 N/A N/A Weeks of Treatment: Open N/A N/A Wound Status: 2.5x2.1x0.1 N/A N/A Measurements L x W x D (cm) 4.123 N/A N/A A (cm) : rea 0.412 N/A N/A Volume (cm) : 74.70% N/A N/A % Reduction in Area: 74.70% N/A N/A % Reduction in Volume: Full Thickness Without Exposed N/A N/A Classification: Support Structures Medium N/A N/A Exudate Amount: Serosanguineous N/A N/A Exudate Type: red, brown N/A N/A Exudate Color: Distinct, outline attached N/A N/A Wound Margin: Large (67-100%) N/A N/A  Granulation Amount: Red, Pink N/A N/A Granulation Quality: None Present (0%) N/A N/A Necrotic Amount: Fat Layer (Subcutaneous Tissue): Yes N/A N/A Exposed Structures: Fascia: No Tendon: No Muscle: No Joint: No Bone: No Small (1-33%) N/A N/A Epithelialization: Compression Therapy N/A N/A Procedures Performed: Treatment Notes Wound #4 (Lower Leg) Wound Laterality: Left, Medial Cleanser Soap and Water Discharge Instruction: May shower and wash wound with dial antibacterial soap and water prior to dressing change. Peri-Wound Care Topical Primary Dressing Hydrofera Blue Ready Foam, 4x5 in Discharge Instruction: Apply to wound bed as instructed Santyl Ointment Discharge Instruction: Apply nickel thick amount to wound bed as instructed Secondary Dressing Woven Gauze Sponge, Non-Sterile 4x4 in Discharge Instruction: Apply over primary dressing as directed. ABD Pad, 8x10 Discharge Instruction: Apply over primary dressing as directed. CarboFLEX Odor Control Dressing, 4x4 in Discharge Instruction: Apply over primary dressing as directed. Secured With Compression Wrap Unnaboot w/Calamine, 4x10 (in/yd) Discharge Instruction: unna at top of wrap to help secure CoFlex TLC XL 2-layer  Compression System 4x7 (in/yd) Discharge Instruction: Apply CoFlex 2-layer compression as directed. (alt for 4 layer) Compression Stockings Add-Ons Electronic Signature(s) Signed: 11/20/2020 4:52:58 PM By: [name] DO Signed: 11/21/2020 5:55:16 PM By: Rhae Hammock RN Entered By: [name] on 11/20/2020 16:46:05 -------------------------------------------------------------------------------- Multi-Disciplinary Care Plan Details Patient Name: Date of Service: [name] 11/16/2020 3:30 PM Medical Record Number: [id number] Patient Account Number: [id number] Date of Birth/Sex: Treating RN: 1971-03-23 (50 y.o. [name] Primary Care Carlisle Enke: [name] Other Clinician: Referring Markeise Mathews: Treating Mallarie Voorhies/Extender: Sammuel Bailiff in Treatment: 3 Multidisciplinary Care Plan reviewed with physician Active Inactive Venous Leg Ulcer Nursing Diagnoses: Actual venous Insuffiency (use after diagnosis is confirmed) Knowledge deficit related to disease process and management Potential for venous Insuffiency (use before diagnosis confirmed) Goals: Patient will maintain optimal edema control Date Initiated: 10/20/2020 Target Resolution Date: 11/24/2020 Goal Status: Active Interventions: Assess peripheral edema status every visit. Compression as ordered Treatment Activities: Therapeutic compression applied : 10/20/2020 Notes: Wound/Skin Impairment Nursing Diagnoses: Impaired tissue integrity Knowledge deficit related to ulceration/compromised skin integrity Goals: Patient/caregiver will verbalize understanding of skin care regimen Date Initiated: 10/20/2020 Target Resolution Date: 11/24/2020 Goal Status: Active Ulcer/skin breakdown will have a volume reduction of 30% by week 4 Date Initiated: 10/20/2020 Target Resolution Date: 11/24/2020 Goal Status: Active Interventions: Assess patient/caregiver ability to obtain  necessary supplies Assess patient/caregiver ability to perform ulcer/skin care regimen upon admission and as needed Assess ulceration(s) every visit Provide education on ulcer and skin care Treatment Activities: Skin care regimen initiated : 10/20/2020 Topical wound management initiated : 10/20/2020 Notes: Electronic Signature(s) Signed: 11/20/2020 4:44:07 PM By: Levan Hurst RN, BSN Entered By: Levan Hurst on 11/16/2020 17:23:32 -------------------------------------------------------------------------------- Pain Assessment Details Patient Name: Date of Service: [name], Kathlee D. 11/16/2020 3:30 PM Medical Record Number: [id number] Patient Account Number: [id number] Date of Birth/Sex: Treating RN: 1971-02-18 (50 y.o. [name], Megan Primary Care Yeily Link: [name] Other Clinician: Referring Terrie Haring: Treating Shakeria Robinette/Extender: Sammuel Bailiff in Treatment: 3 Active Problems Location of Pain Severity and Description of Pain Patient Has Paino No Patient Has Paino No Site Locations Pain Management and Medication Current Pain Management: Electronic Signature(s) Signed: 11/16/2020 7:48:44 PM By: Rhae Hammock RN Signed: 11/17/2020 10:49:45 AM By: Sandre Kitty Entered By: Sandre Kitty on 11/16/2020 16:48:02 -------------------------------------------------------------------------------- Patient/Caregiver Education Details Patient Name: Date of Service: [name] 7/21/2022andnbsp3:30 PM Medical Record Number: [id number] Patient Account Number: [id number] Date of Birth/Gender: Treating RN:  06/09/1970 (50 y.o. [name] Primary Care Physician: [name] Other Clinician: Referring Physician: Treating Physician/Extender: Sammuel Bailiff in Treatment: 3 Education Assessment Education Provided To: Patient Education Topics Provided Wound/Skin Impairment: Methods:  Explain/Verbal Responses: State content correctly Electronic Signature(s) Signed: 11/20/2020 4:44:07 PM By: Levan Hurst RN, BSN Entered By: Levan Hurst on 11/16/2020 17:24:15 -------------------------------------------------------------------------------- Wound Assessment Details Patient Name: Date of Service: [name] 11/16/2020 3:30 PM Medical Record Number: [id number] Patient Account Number: [id number] Date of Birth/Sex: Treating RN: May 16, 1970 (50 y.o. [name], Megan Primary Care Naim Murtha: Other Clinician: [name] Referring Cassandre Oleksy: Treating Leela Vanbrocklin/Extender: Sammuel Bailiff in Treatment: 3 Wound Status Wound Number: 4 Primary Venous Leg Ulcer Etiology: Wound Location: Left, Medial Lower Leg Wound Open Wounding Event: Gradually Appeared Status: Date Acquired: 09/26/2020 Comorbid Sleep Apnea, Congestive Heart Failure, Coronary Artery Weeks Of Treatment: 3 History: Disease, Hypertension Clustered Wound: No Photos Wound Measurements Length: (cm) 2.5 Width: (cm) 2.1 Depth: (cm) 0.1 Area: (cm) 4.123 Volume: (cm) 0.412 % Reduction in Area: 74.7% % Reduction in Volume: 74.7% Epithelialization: Small (1-33%) Tunneling: No Undermining: No Wound Description Classification: Full Thickness Without Exposed Support Structures Wound Margin: Distinct, outline attached Exudate Amount: Medium Exudate Type: Serosanguineous Exudate Color: red, brown Foul Odor After Cleansing: No Slough/Fibrino No Wound Bed Granulation Amount: Large (67-100%) Exposed Structure Granulation Quality: Red, Pink Fascia Exposed: No Necrotic Amount: None Present (0%) Fat Layer (Subcutaneous Tissue) Exposed: Yes Tendon Exposed: No Muscle Exposed: No Joint Exposed: No Bone Exposed: No Treatment Notes Wound #4 (Lower Leg) Wound Laterality: Left, Medial Cleanser Soap and Water Discharge Instruction: May shower and wash wound with dial  antibacterial soap and water prior to dressing change. Peri-Wound Care Topical Primary Dressing Hydrofera Blue Ready Foam, 4x5 in Discharge Instruction: Apply to wound bed as instructed Santyl Ointment Discharge Instruction: Apply nickel thick amount to wound bed as instructed Secondary Dressing Woven Gauze Sponge, Non-Sterile 4x4 in Discharge Instruction: Apply over primary dressing as directed. ABD Pad, 8x10 Discharge Instruction: Apply over primary dressing as directed. CarboFLEX Odor Control Dressing, 4x4 in Discharge Instruction: Apply over primary dressing as directed. Secured With Compression Wrap Unnaboot w/Calamine, 4x10 (in/yd) Discharge Instruction: unna at top of wrap to help secure CoFlex TLC XL 2-layer Compression System 4x7 (in/yd) Discharge Instruction: Apply CoFlex 2-layer compression as directed. (alt for 4 layer) Compression Stockings Add-Ons Electronic Signature(s) Signed: 11/16/2020 7:48:44 PM By: Rhae Hammock RN Signed: 11/20/2020 4:44:07 PM By: Levan Hurst RN, BSN Entered By: Levan Hurst on 11/16/2020 17:11:05 -------------------------------------------------------------------------------- [place] Details Patient Name: Date of Service: [name], Jowanda D. 11/16/2020 3:30 PM Medical Record Number: [id number] Patient Account Number: [id number] Date of Birth/Sex: Treating RN: [birth year] (50 y.o. [name], Megan Primary Care Deshawn Skelley: [name] Other Clinician: Referring Jaylanni Eltringham: Treating Cesareo Vickrey/Extender: Sammuel Bailiff in Treatment: 3 Vital Signs Time Taken: 16:47 Temperature (F): 97.7 Height (in): 67 Pulse (bpm): 77 Weight (lbs): 346 Respiratory Rate (breaths/min): 18 Body Mass Index (BMI): 54.2 Blood Pressure (mmHg): 130/74 Reference Range: 80 - 120 mg / dl Electronic Signature(s) Signed: 11/17/2020 10:49:45 AM By: Sandre Kitty Entered By: Sandre Kitty on 11/16/2020 16:47:16

## 2020-11-21 NOTE — Progress Notes (Signed)
RODESSA, ENGBERG ([id number]) Visit Report for 11/16/2020 Chief Complaint Document Details Patient Name: Date of Service: Megan, [name] 11/16/2020 3:30 PM Medical Record Number: [id number] Patient Account Number: [id number] Date of Birth/Sex: Treating RN: May 16, 1970 (50 y.o. [name], [name] Primary Care Provider: [name] Other Clinician: Referring Provider: Treating Provider/Extender: [name] in Treatment: 3 Information Obtained from: Patient Chief Complaint Left lower extremity wound Electronic Signature(s) Signed: 11/20/2020 4:52:58 PM By: [name] DO Entered By: [name] on 11/20/2020 16:46:14 -------------------------------------------------------------------------------- HPI Details Patient Name: Date of Service: [name], [name] 11/16/2020 3:30 PM Medical Record Number: [id number] Patient Account Number: [id number] Date of Birth/Sex: Treating RN: [birth year] (50 y.o. [name] Primary Care Provider: [name] Other Clinician: Referring Provider: Treating Provider/Extender: [name] in Treatment: 3 History of Present Illness HPI Description: Admission 6/24 Ms. Aichatou Mowry is a 50 year old female with a past medical history of chronic venous insufficiency, lymphedema, DVT on anticoagulation and diastolic heart failure that presents to the clinic for left lower extremity wound. She was last seen 4 months ago in our clinic for the same issue. The reoccurring wound started at the end of May and has been going on for 1 month. She has been using an ointment and I am unclear what this is. She tries to keep her leg elevated with her compression stocking. She also reports she has lymphedema pumps and has been using them as well. She reports mild pain to the area. She denies signs of infection including increased warmth, erythema or purulent drainage. 7/1; patient  presents for 1 week follow-up. She has tolerated the wrap well. Unfortunately she did have trouble with the wrap Sliding down her leg 2 days ago. She has no issues or complaints today. She denies signs of infection. 7/15; patient presents for follow-up. She has tolerated the wrap well. She has no issues or complaints today. She denies signs of infection. 7/25; patient presents for 1 week follow-up. She has no issues or complaints today. She denies signs of infection. Electronic Signature(s) Signed: 11/20/2020 4:52:58 PM By: [name] DO Entered By: [name] on 11/20/2020 16:46:40 -------------------------------------------------------------------------------- Physical Exam Details Patient Name: Date of Service: Megan Session D. 11/16/2020 3:30 PM Medical Record Number: [id number] Patient Account Number: [id number] Date of Birth/Sex: Treating RN: 1970/09/03 (50 y.o. [name], [name] Primary Care Provider: [name] Other Clinician: Referring Provider: Treating Provider/Extender: [name] in Treatment: 3 Constitutional respirations regular, non-labored and within target range for patient.. Cardiovascular 2+ dorsalis pedis/posterior tibialis pulses. Psychiatric pleasant and cooperative. Notes Left lower extremity: On the medial aspect there is an open wound with granulation tissue present. No signs of infection. Good edema control. Electronic Signature(s) Signed: 11/20/2020 4:52:58 PM By: [name] DO Entered By: [name] on 11/20/2020 16:47:12 -------------------------------------------------------------------------------- Physician Orders Details Patient Name: Date of Service: Megan Session D. 11/16/2020 3:30 PM Medical Record Number: [id number] Patient Account Number: [id number] Date of Birth/Sex: Treating RN: 05-27-1970 (50 y.o. [name] Primary Care Provider: [name] Other  Clinician: Referring Provider: Treating Provider/Extender: [name] in Treatment: 3 Verbal / Phone Orders: No Diagnosis Coding ICD-10 Coding Code Description [phone] Non-pressure chronic ulcer of other part of left lower leg with unspecified severity I87.2 Venous insufficiency (chronic) (peripheral) I89.0 Lymphedema, not elsewhere classified Z86.718 Personal history of other venous thrombosis and embolism Follow-up Appointments ppointment in 2 weeks. - Dr. Heber [place] Return  A Nurse Visit: - 1 week for rewrap Bathing/ Shower/ Hygiene May shower with protection but do not get wound dressing(s) wet. - use cast protector to keep wrap dry in the shower Edema Control - Lymphedema / SCD / Other Bilateral Lower Extremities Lymphedema Pumps. Use Lymphedema pumps on leg(s) 2-3 times a day for 45-60 minutes. If wearing any wraps or hose, do not remove them. Continue exercising as instructed. Elevate legs to the level of the heart or above for 30 minutes daily and/or when sitting, a frequency of: Avoid standing for long periods of time. Patient to wear own compression stockings every day. - right leg daily Exercise regularly Wound Treatment Wound #4 - Lower Leg Wound Laterality: Left, Medial Cleanser: Soap and Water 1 x Per Week/15 Days Discharge Instructions: May shower and wash wound with dial antibacterial soap and water prior to dressing change. Prim Dressing: Hydrofera Blue Ready Foam, 4x5 in ary 1 x Per Week/15 Days Discharge Instructions: Apply to wound bed as instructed Prim Dressing: Santyl Ointment 1 x Per Week/15 Days ary Discharge Instructions: Apply nickel thick amount to wound bed as instructed Secondary Dressing: Woven Gauze Sponge, Non-Sterile 4x4 in 1 x Per Week/15 Days Discharge Instructions: Apply over primary dressing as directed. Secondary Dressing: ABD Pad, 8x10 1 x Per Week/15 Days Discharge Instructions: Apply over primary dressing as  directed. Secondary Dressing: CarboFLEX Odor Control Dressing, 4x4 in 1 x Per Week/15 Days Discharge Instructions: Apply over primary dressing as directed. Compression Wrap: Unnaboot w/Calamine, 4x10 (in/yd) 1 x Per Week/15 Days Discharge Instructions: unna at top of wrap to help secure Compression Wrap: CoFlex TLC XL 2-layer Compression System 4x7 (in/yd) 1 x Per Week/15 Days Discharge Instructions: Apply CoFlex 2-layer compression as directed. (alt for 4 layer) Electronic Signature(s) Signed: 11/20/2020 4:52:58 PM By: [name] DO Previous Signature: 11/20/2020 4:44:07 PM Version By: Levan Hurst RN, BSN Entered By: [name] on 11/20/2020 16:50:27 -------------------------------------------------------------------------------- Problem List Details Patient Name: Date of Service: Megan Session D. 11/16/2020 3:30 PM Medical Record Number: [id number] Patient Account Number: [id number] Date of Birth/Sex: Treating RN: Dec 17, 1970 (50 y.o. [name] Primary Care Provider: [name] Other Clinician: Referring Provider: Treating Provider/Extender: [name] in Treatment: 3 Active Problems ICD-10 Encounter Code Description Active Date MDM Diagnosis L97.829 Non-pressure chronic ulcer of other part of left lower leg with unspecified 10/20/2020 No Yes severity I87.2 Venous insufficiency (chronic) (peripheral) 10/20/2020 No Yes I89.0 Lymphedema, not elsewhere classified 10/20/2020 No Yes Z86.718 Personal history of other venous thrombosis and embolism 10/20/2020 No Yes Inactive Problems Resolved Problems Electronic Signature(s) Signed: 11/20/2020 4:52:58 PM By: [name] DO Previous Signature: 11/20/2020 4:44:07 PM Version By: Levan Hurst RN, BSN Entered By: [name] on 11/20/2020 16:45:46 -------------------------------------------------------------------------------- Progress Note Details Patient Name: Date of  Service: [name], [name] 11/16/2020 3:30 PM Medical Record Number: [id number] Patient Account Number: [id number] Date of Birth/Sex: Treating RN: 1970-07-21 (50 y.o. [name], [name] Primary Care Provider: [name] Other Clinician: Referring Provider: Treating Provider/Extender: [name] in Treatment: 3 Subjective Chief Complaint Information obtained from Patient Left lower extremity wound History of Present Illness (HPI) Admission 6/24 Ms. Sincerity Cathcart is a 50 year old female with a past medical history of chronic venous insufficiency, lymphedema, DVT on anticoagulation and diastolic heart failure that presents to the clinic for left lower extremity wound. She was last seen 4 months ago in our clinic for the same issue. The reoccurring wound started at the  end of May and has been going on for 1 month. She has been using an ointment and I am unclear what this is. She tries to keep her leg elevated with her compression stocking. She also reports she has lymphedema pumps and has been using them as well. She reports mild pain to the area. She denies signs of infection including increased warmth, erythema or purulent drainage. 7/1; patient presents for 1 week follow-up. She has tolerated the wrap well. Unfortunately she did have trouble with the wrap Sliding down her leg 2 days ago. She has no issues or complaints today. She denies signs of infection. 7/15; patient presents for follow-up. She has tolerated the wrap well. She has no issues or complaints today. She denies signs of infection. 7/25; patient presents for 1 week follow-up. She has no issues or complaints today. She denies signs of infection. Patient History Information obtained from Patient. Family History Diabetes - Mother,Maternal Grandparents, Heart Disease - Paternal Grandparents, Hypertension - Mother,Maternal Grandparents, Lung Disease - Father,Paternal Grandparents, No family  history of Cancer, Hereditary Spherocytosis, Kidney Disease, Seizures, Stroke, Thyroid Problems, Tuberculosis. Social History Former smoker, Marital Status - Single, Alcohol Use - Never, Drug Use - No History, Caffeine Use - Daily. Medical History Eyes Denies history of Cataracts, Glaucoma, Optic Neuritis Ear/Nose/Mouth/Throat Denies history of Chronic sinus problems/congestion, Middle ear problems Hematologic/Lymphatic Denies history of Anemia, Hemophilia, Human Immunodeficiency Virus, Lymphedema, Sickle Cell Disease Respiratory Patient has history of Sleep Apnea Denies history of Aspiration, Asthma, Chronic Obstructive Pulmonary Disease (COPD), Pneumothorax, Tuberculosis Cardiovascular Patient has history of Congestive Heart Failure, Coronary Artery Disease, Hypertension Denies history of Angina, Arrhythmia, Deep Vein Thrombosis, Hypotension, Myocardial Infarction, Peripheral Arterial Disease, Peripheral Venous Disease, Phlebitis, Vasculitis Gastrointestinal Denies history of Cirrhosis , Colitis, Crohnoos, Hepatitis A, Hepatitis B, Hepatitis C Endocrine Denies history of Type I Diabetes, Type II Diabetes Genitourinary Denies history of End Stage Renal Disease Immunological Denies history of Lupus Erythematosus, Raynaudoos, Scleroderma Integumentary (Skin) Denies history of History of Burn Musculoskeletal Denies history of Gout, Rheumatoid Arthritis, Osteoarthritis, Osteomyelitis Neurologic Denies history of Dementia, Neuropathy, Quadriplegia, Paraplegia, Seizure Disorder Oncologic Denies history of Received Chemotherapy, Received Radiation Psychiatric Denies history of Anorexia/bulimia, Confinement Anxiety Objective Constitutional respirations regular, non-labored and within target range for patient.. Vitals Time Taken: 4:47 PM, Height: 67 in, Weight: 346 lbs, BMI: 54.2, Temperature: 97.7 F, Pulse: 77 bpm, Respiratory Rate: 18 breaths/min, Blood Pressure: 130/74  mmHg. Cardiovascular 2+ dorsalis pedis/posterior tibialis pulses. Psychiatric pleasant and cooperative. General Notes: Left lower extremity: On the medial aspect there is an open wound with granulation tissue present. No signs of infection. Good edema control. Integumentary (Hair, Skin) Wound #4 status is Open. Original cause of wound was Gradually Appeared. The date acquired was: 09/26/2020. The wound has been in treatment 3 weeks. The wound is located on the Left,Medial Lower Leg. The wound measures 2.5cm length x 2.1cm width x 0.1cm depth; 4.123cm^2 area and 0.412cm^3 volume. There is Fat Layer (Subcutaneous Tissue) exposed. There is no tunneling or undermining noted. There is a medium amount of serosanguineous drainage noted. The wound margin is distinct with the outline attached to the wound base. There is large (67-100%) red, pink granulation within the wound bed. There is no necrotic tissue within the wound bed. Assessment Active Problems ICD-10 Non-pressure chronic ulcer of other part of left lower leg with unspecified severity Venous insufficiency (chronic) (peripheral) Lymphedema, not elsewhere classified Personal history of other venous thrombosis and embolism Patient's wound has shown improvement in size  and appearance since last clinic visit. No signs of infection on exam. I recommended continuing Santyl with Hydrofera Blue under 4-layer compression as this is done very well for the patient. Procedures Wound #4 Pre-procedure diagnosis of Wound #4 is a Venous Leg Ulcer located on the Left,Medial Lower Leg . There was a Four Layer Compression Therapy Procedure by Levan Hurst, RN. Post procedure Diagnosis Wound #4: Same as Pre-Procedure Plan Follow-up Appointments: Return Appointment in 2 weeks. - Dr. Heber [place] Nurse Visit: - 1 week for rewrap Bathing/ Shower/ Hygiene: May shower with protection but do not get wound dressing(s) wet. - use cast protector to keep wrap dry in  the shower Edema Control - Lymphedema / SCD / Other: Lymphedema Pumps. Use Lymphedema pumps on leg(s) 2-3 times a day for 45-60 minutes. If wearing any wraps or hose, do not remove them. Continue exercising as instructed. Elevate legs to the level of the heart or above for 30 minutes daily and/or when sitting, a frequency of: Avoid standing for long periods of time. Patient to wear own compression stockings every day. - right leg daily Exercise regularly WOUND #4: - Lower Leg Wound Laterality: Left, Medial Cleanser: Soap and Water 1 x Per Week/15 Days Discharge Instructions: May shower and wash wound with dial antibacterial soap and water prior to dressing change. Prim Dressing: Hydrofera Blue Ready Foam, 4x5 in 1 x Per Week/15 Days ary Discharge Instructions: Apply to wound bed as instructed Prim Dressing: Santyl Ointment 1 x Per Week/15 Days ary Discharge Instructions: Apply nickel thick amount to wound bed as instructed Secondary Dressing: Woven Gauze Sponge, Non-Sterile 4x4 in 1 x Per Week/15 Days Discharge Instructions: Apply over primary dressing as directed. Secondary Dressing: ABD Pad, 8x10 1 x Per Week/15 Days Discharge Instructions: Apply over primary dressing as directed. Secondary Dressing: CarboFLEX Odor Control Dressing, 4x4 in 1 x Per Week/15 Days Discharge Instructions: Apply over primary dressing as directed. Compression Wrap: Unnaboot w/Calamine, 4x10 (in/yd) 1 x Per Week/15 Days Discharge Instructions: unna at top of wrap to help secure Compression Wrap: CoFlex TLC XL 2-layer Compression System 4x7 (in/yd) 1 x Per Week/15 Days Discharge Instructions: Apply CoFlex 2-layer compression as directed. (alt for 4 layer) 1. Hydrofera Blue, Santyl under 4-layer compression 2. Follow-up next week for nurse visit and in 2 weeks with me Electronic Signature(s) Signed: 11/20/2020 4:52:58 PM By: [name] DO Entered By: [name] on 11/20/2020  16:52:12 -------------------------------------------------------------------------------- HxROS Details Patient Name: Date of Service: [name], [name] 11/16/2020 3:30 PM Medical Record Number: [id number] Patient Account Number: [id number] Date of Birth/Sex: Treating RN: May 09, 1970 (50 y.o. [name], [name] Primary Care Provider: [name] Other Clinician: Referring Provider: Treating Provider/Extender: [name] in Treatment: 3 Information Obtained From Patient Eyes Medical History: Negative for: Cataracts; Glaucoma; Optic Neuritis Ear/Nose/Mouth/Throat Medical History: Negative for: Chronic sinus problems/congestion; Middle ear problems Hematologic/Lymphatic Medical History: Negative for: Anemia; Hemophilia; Human Immunodeficiency Virus; Lymphedema; Sickle Cell Disease Respiratory Medical History: Positive for: Sleep Apnea Negative for: Aspiration; Asthma; Chronic Obstructive Pulmonary Disease (COPD); Pneumothorax; Tuberculosis Cardiovascular Medical History: Positive for: Congestive Heart Failure; Coronary Artery Disease; Hypertension Negative for: Angina; Arrhythmia; Deep Vein Thrombosis; Hypotension; Myocardial Infarction; Peripheral Arterial Disease; Peripheral Venous Disease; Phlebitis; Vasculitis Gastrointestinal Medical History: Negative for: Cirrhosis ; Colitis; Crohns; Hepatitis A; Hepatitis B; Hepatitis C Endocrine Medical History: Negative for: Type I Diabetes; Type II Diabetes Genitourinary Medical History: Negative for: End Stage Renal Disease Immunological Medical History: Negative for: Lupus Erythematosus; Raynauds; Scleroderma  Integumentary (Skin) Medical History: Negative for: History of Burn Musculoskeletal Medical History: Negative for: Gout; Rheumatoid Arthritis; Osteoarthritis; Osteomyelitis Neurologic Medical History: Negative for: Dementia; Neuropathy; Quadriplegia; Paraplegia; Seizure  Disorder Oncologic Medical History: Negative for: Received Chemotherapy; Received Radiation Psychiatric Medical History: Negative for: Anorexia/bulimia; Confinement Anxiety Immunizations Pneumococcal Vaccine: Received Pneumococcal Vaccination: No Implantable Devices None Family and Social History Cancer: No; Diabetes: Yes - Mother,Maternal Grandparents; Heart Disease: Yes - Paternal Grandparents; Hereditary Spherocytosis: No; Hypertension: Yes - Mother,Maternal Grandparents; Kidney Disease: No; Lung Disease: Yes - Father,Paternal Grandparents; Seizures: No; Stroke: No; Thyroid Problems: No; Tuberculosis: No; Former smoker; Marital Status - Single; Alcohol Use: Never; Drug Use: No History; Caffeine Use: Daily; Financial Concerns: No; Food, Clothing or Shelter Needs: No; Support System Lacking: No; Transportation Concerns: No Electronic Signature(s) Signed: 11/20/2020 4:52:58 PM By: [name] DO Signed: 11/21/2020 5:55:16 PM By: Rhae Hammock RN Entered By: [name] on 11/20/2020 16:46:47 -------------------------------------------------------------------------------- SuperBill Details Patient Name: Date of Service: [name], [name] 11/16/2020 Medical Record Number: [id number] Patient Account Number: [id number] Date of Birth/Sex: Treating RN: March 21, 1971 (50 y.o. [name] Primary Care Provider: [name] Other Clinician: Referring Provider: Treating Provider/Extender: [name] in Treatment: 3 Diagnosis Coding ICD-10 Codes Code Description [phone] Non-pressure chronic ulcer of other part of left lower leg with unspecified severity I87.2 Venous insufficiency (chronic) (peripheral) I89.0 Lymphedema, not elsewhere classified Z86.718 Personal history of other venous thrombosis and embolism Facility Procedures CPT4 Code: [id number] Description: (Facility Use Only) [phone] - [facility] [id number] LWR LT  LEG Modifier: Quantity: 1 Physician Procedures : CPT4 Code Description Modifier [id number] - WC PHYS LEVEL 3 - EST PT ICD-10 Diagnosis Description L97.829 Non-pressure chronic ulcer of other part of left lower leg with unspecified severity I87.2 Venous insufficiency (chronic) (peripheral) I89.0  Lymphedema, not elsewhere classified Quantity: 1 Electronic Signature(s) Signed: 11/20/2020 4:52:58 PM By: [name] DO Previous Signature: 11/20/2020 4:44:07 PM Version By: Levan Hurst RN, BSN Entered By: [name] on 11/20/2020 16:52:31

## 2020-11-22 ENCOUNTER — Emergency Department (HOSPITAL_COMMUNITY): Payer: Medicaid Other

## 2020-11-22 ENCOUNTER — Other Ambulatory Visit: Payer: Self-pay

## 2020-11-22 ENCOUNTER — Encounter (HOSPITAL_COMMUNITY): Payer: Self-pay | Admitting: Emergency Medicine

## 2020-11-22 ENCOUNTER — Inpatient Hospital Stay (HOSPITAL_COMMUNITY)
Admission: EM | Admit: 2020-11-22 | Discharge: 2020-11-27 | DRG: 291 | Disposition: A | Discharge status: Home / Self Care | Payer: Medicaid Other | Attending: Internal Medicine | Admitting: Internal Medicine

## 2020-11-22 ENCOUNTER — Inpatient Hospital Stay: Payer: Medicaid Other | Attending: Hematology

## 2020-11-22 DIAGNOSIS — R0602 Shortness of breath: Secondary | ICD-10-CM | POA: Diagnosis not present

## 2020-11-22 DIAGNOSIS — M7989 Other specified soft tissue disorders: Secondary | ICD-10-CM | POA: Diagnosis not present

## 2020-11-22 DIAGNOSIS — E876 Hypokalemia: Secondary | ICD-10-CM | POA: Diagnosis not present

## 2020-11-22 DIAGNOSIS — J9601 Acute respiratory failure with hypoxia: Secondary | ICD-10-CM | POA: Diagnosis not present

## 2020-11-22 DIAGNOSIS — Z886 Allergy status to analgesic agent status: Secondary | ICD-10-CM

## 2020-11-22 DIAGNOSIS — Z87891 Personal history of nicotine dependence: Secondary | ICD-10-CM

## 2020-11-22 DIAGNOSIS — J80 Acute respiratory distress syndrome: Secondary | ICD-10-CM | POA: Diagnosis not present

## 2020-11-22 DIAGNOSIS — J45901 Unspecified asthma with (acute) exacerbation: Secondary | ICD-10-CM | POA: Diagnosis not present

## 2020-11-22 DIAGNOSIS — R0902 Hypoxemia: Secondary | ICD-10-CM | POA: Diagnosis not present

## 2020-11-22 DIAGNOSIS — I110 Hypertensive heart disease with heart failure: Principal | ICD-10-CM | POA: Diagnosis present

## 2020-11-22 DIAGNOSIS — I252 Old myocardial infarction: Secondary | ICD-10-CM

## 2020-11-22 DIAGNOSIS — Z7951 Long term (current) use of inhaled steroids: Secondary | ICD-10-CM | POA: Diagnosis not present

## 2020-11-22 DIAGNOSIS — I5030 Unspecified diastolic (congestive) heart failure: Secondary | ICD-10-CM | POA: Diagnosis present

## 2020-11-22 DIAGNOSIS — Z20822 Contact with and (suspected) exposure to covid-19: Secondary | ICD-10-CM | POA: Diagnosis not present

## 2020-11-22 DIAGNOSIS — I5033 Acute on chronic diastolic (congestive) heart failure: Secondary | ICD-10-CM | POA: Diagnosis present

## 2020-11-22 DIAGNOSIS — Z86718 Personal history of other venous thrombosis and embolism: Secondary | ICD-10-CM

## 2020-11-22 DIAGNOSIS — Z7901 Long term (current) use of anticoagulants: Secondary | ICD-10-CM | POA: Diagnosis not present

## 2020-11-22 DIAGNOSIS — J4541 Moderate persistent asthma with (acute) exacerbation: Secondary | ICD-10-CM

## 2020-11-22 DIAGNOSIS — Z885 Allergy status to narcotic agent status: Secondary | ICD-10-CM | POA: Diagnosis not present

## 2020-11-22 DIAGNOSIS — E1165 Type 2 diabetes mellitus with hyperglycemia: Secondary | ICD-10-CM | POA: Diagnosis not present

## 2020-11-22 DIAGNOSIS — Z833 Family history of diabetes mellitus: Secondary | ICD-10-CM | POA: Diagnosis not present

## 2020-11-22 DIAGNOSIS — R600 Localized edema: Secondary | ICD-10-CM

## 2020-11-22 DIAGNOSIS — Z888 Allergy status to other drugs, medicaments and biological substances status: Secondary | ICD-10-CM

## 2020-11-22 DIAGNOSIS — I700 Atherosclerosis of aorta: Secondary | ICD-10-CM | POA: Diagnosis not present

## 2020-11-22 DIAGNOSIS — R509 Fever, unspecified: Secondary | ICD-10-CM | POA: Diagnosis not present

## 2020-11-22 DIAGNOSIS — D696 Thrombocytopenia, unspecified: Secondary | ICD-10-CM | POA: Diagnosis present

## 2020-11-22 DIAGNOSIS — Z95828 Presence of other vascular implants and grafts: Secondary | ICD-10-CM | POA: Diagnosis not present

## 2020-11-22 DIAGNOSIS — Z6841 Body Mass Index (BMI) 40.0 and over, adult: Secondary | ICD-10-CM | POA: Diagnosis not present

## 2020-11-22 DIAGNOSIS — J9600 Acute respiratory failure, unspecified whether with hypoxia or hypercapnia: Secondary | ICD-10-CM | POA: Diagnosis not present

## 2020-11-22 DIAGNOSIS — D500 Iron deficiency anemia secondary to blood loss (chronic): Secondary | ICD-10-CM | POA: Diagnosis not present

## 2020-11-22 DIAGNOSIS — Z8711 Personal history of peptic ulcer disease: Secondary | ICD-10-CM | POA: Diagnosis not present

## 2020-11-22 DIAGNOSIS — Z803 Family history of malignant neoplasm of breast: Secondary | ICD-10-CM | POA: Diagnosis not present

## 2020-11-22 DIAGNOSIS — I2510 Atherosclerotic heart disease of native coronary artery without angina pectoris: Secondary | ICD-10-CM | POA: Diagnosis not present

## 2020-11-22 DIAGNOSIS — Z881 Allergy status to other antibiotic agents status: Secondary | ICD-10-CM

## 2020-11-22 DIAGNOSIS — Z79899 Other long term (current) drug therapy: Secondary | ICD-10-CM

## 2020-11-22 DIAGNOSIS — Z825 Family history of asthma and other chronic lower respiratory diseases: Secondary | ICD-10-CM

## 2020-11-22 DIAGNOSIS — A419 Sepsis, unspecified organism: Secondary | ICD-10-CM | POA: Diagnosis not present

## 2020-11-22 DIAGNOSIS — L0390 Cellulitis, unspecified: Secondary | ICD-10-CM | POA: Diagnosis not present

## 2020-11-22 DIAGNOSIS — R0789 Other chest pain: Secondary | ICD-10-CM | POA: Diagnosis not present

## 2020-11-22 DIAGNOSIS — R079 Chest pain, unspecified: Secondary | ICD-10-CM | POA: Diagnosis not present

## 2020-11-22 DIAGNOSIS — Z91041 Radiographic dye allergy status: Secondary | ICD-10-CM

## 2020-11-22 DIAGNOSIS — I517 Cardiomegaly: Secondary | ICD-10-CM | POA: Diagnosis not present

## 2020-11-22 HISTORY — DX: Type 2 diabetes mellitus without complications: E11.9

## 2020-11-22 LAB — COMPREHENSIVE METABOLIC PANEL
ALT: 11 U/L (ref 0–44)
AST: 16 U/L (ref 15–41)
Albumin: 3.3 g/dL — ABNORMAL LOW (ref 3.5–5.0)
Alkaline Phosphatase: 69 U/L (ref 38–126)
Anion gap: 7 (ref 5–15)
BUN: 10 mg/dL (ref 6–20)
CO2: 26 mmol/L (ref 22–32)
Calcium: 8.9 mg/dL (ref 8.9–10.3)
Chloride: 102 mmol/L (ref 98–111)
Creatinine, Ser: 0.84 mg/dL (ref 0.44–1.00)
GFR, Estimated: >60 mL/min (ref >60)
Glucose, Bld: 155 mg/dL — ABNORMAL HIGH (ref 70–99)
Potassium: 3.5 mmol/L (ref 3.5–5.1)
Sodium: 135 mmol/L (ref 135–145)
Total Bilirubin: 0.9 mg/dL (ref 0.3–1.2)
Total Protein: 7.1 g/dL (ref 6.5–8.1)

## 2020-11-22 LAB — RESP PANEL BY RT-PCR (FLU A&B, COVID) ARPGX2
Influenza A by PCR: NEGATIVE
Influenza B by PCR: NEGATIVE
SARS Coronavirus 2 by RT PCR: NEGATIVE

## 2020-11-22 LAB — CBC
HCT: 35 % — ABNORMAL LOW (ref 36.0–46.0)
Hemoglobin: 10.2 g/dL — ABNORMAL LOW (ref 12.0–15.0)
MCH: 27.4 pg (ref 26.0–34.0)
MCHC: 29.1 g/dL — ABNORMAL LOW (ref 30.0–36.0)
MCV: 94.1 fL (ref 80.0–100.0)
Platelets: 117 10*3/uL — ABNORMAL LOW (ref 150–400)
RBC: 3.72 MIL/uL — ABNORMAL LOW (ref 3.87–5.11)
RDW: 14.2 % (ref 11.5–15.5)
WBC: 6.4 10*3/uL (ref 4.0–10.5)
nRBC: 0 % (ref 0.0–0.2)

## 2020-11-22 LAB — I-STAT ARTERIAL BLOOD GAS, ED
Acid-Base Excess: 3 mmol/L — ABNORMAL HIGH (ref 0.0–2.0)
Bicarbonate: 27.8 mmol/L (ref 20.0–28.0)
Calcium, Ion: 1.25 mmol/L (ref 1.15–1.40)
HCT: 31 % — ABNORMAL LOW (ref 36.0–46.0)
Hemoglobin: 10.5 g/dL — ABNORMAL LOW (ref 12.0–15.0)
O2 Saturation: 100 %
Patient temperature: 98.6
Potassium: 3.4 mmol/L — ABNORMAL LOW (ref 3.5–5.1)
Sodium: 140 mmol/L (ref 135–145)
TCO2: 29 mmol/L (ref 22–32)
pCO2 arterial: 44.9 mmHg (ref 32.0–48.0)
pH, Arterial: 7.4 (ref 7.350–7.450)
pO2, Arterial: 181 mmHg — ABNORMAL HIGH (ref 83.0–108.0)

## 2020-11-22 LAB — URINALYSIS, ROUTINE W REFLEX MICROSCOPIC
Bilirubin Urine: NEGATIVE
Glucose, UA: NEGATIVE mg/dL
Hgb urine dipstick: NEGATIVE
Ketones, ur: NEGATIVE mg/dL
Leukocytes,Ua: NEGATIVE
Nitrite: NEGATIVE
Protein, ur: NEGATIVE mg/dL
Specific Gravity, Urine: 1.025 (ref 1.005–1.030)
pH: 5 (ref 5.0–8.0)

## 2020-11-22 LAB — TROPONIN I (HIGH SENSITIVITY): Troponin I (High Sensitivity): 8 ng/L (ref <18)

## 2020-11-22 LAB — I-STAT BETA HCG BLOOD, ED (MC, WL, AP ONLY): I-stat hCG, quantitative: <5 m[IU]/mL (ref <5)

## 2020-11-22 MED ORDER — KETOROLAC TROMETHAMINE 15 MG/ML IJ SOLN
15.0000 mg | Freq: Once | INTRAMUSCULAR | Status: AC
  Administered 2020-11-23: 15 mg via INTRAVENOUS
  Filled 2020-11-22: qty 1

## 2020-11-22 MED ORDER — SODIUM CHLORIDE 0.9% FLUSH
10.0000 mL | INTRAVENOUS | Status: DC | PRN
  Administered 2020-11-27: 10 mL

## 2020-11-22 MED ORDER — ACETAMINOPHEN 325 MG PO TABS
650.0000 mg | ORAL_TABLET | Freq: Once | ORAL | Status: AC
  Administered 2020-11-22: 650 mg via ORAL
  Filled 2020-11-22: qty 2

## 2020-11-22 MED ORDER — SODIUM CHLORIDE 0.9 % IV SOLN
2.0000 g | Freq: Three times a day (TID) | INTRAVENOUS | Status: DC
  Administered 2020-11-23 – 2020-11-25 (×7): 2 g via INTRAVENOUS
  Filled 2020-11-22 (×10): qty 2

## 2020-11-22 MED ORDER — LACTATED RINGERS IV BOLUS (SEPSIS)
1000.0000 mL | Freq: Once | INTRAVENOUS | Status: AC
Start: 2020-11-22 — End: 2020-11-23
  Administered 2020-11-22: 1000 mL via INTRAVENOUS

## 2020-11-22 MED ORDER — SODIUM CHLORIDE 0.9% FLUSH
10.0000 mL | INTRAVENOUS | Status: AC | PRN
Start: 2020-11-22 — End: 2020-11-22
  Administered 2020-11-22: 10 mL
  Filled 2020-11-22: qty 10

## 2020-11-22 MED ORDER — LACTATED RINGERS IV BOLUS
1000.0000 mL | Freq: Once | INTRAVENOUS | Status: AC
  Administered 2020-11-22: 1000 mL via INTRAVENOUS

## 2020-11-22 MED ORDER — CHLORHEXIDINE GLUCONATE CLOTH 2 % EX PADS
6.0000 | MEDICATED_PAD | Freq: Every day | CUTANEOUS | Status: DC
  Administered 2020-11-23 – 2020-11-27 (×5): 6 via TOPICAL

## 2020-11-22 MED ORDER — LACTATED RINGERS IV BOLUS (SEPSIS): 1000.0000 mL | Freq: Once | INTRAVENOUS | Status: DC

## 2020-11-22 MED ORDER — ALBUTEROL SULFATE (2.5 MG/3ML) 0.083% IN NEBU: 2.5000 mg | INHALATION_SOLUTION | RESPIRATORY_TRACT | Status: DC | PRN

## 2020-11-22 MED ORDER — LACTATED RINGERS IV BOLUS (SEPSIS)
1000.0000 mL | Freq: Once | INTRAVENOUS | Status: AC
  Administered 2020-11-23: 1000 mL via INTRAVENOUS

## 2020-11-22 MED ORDER — HEPARIN SOD (PORK) LOCK FLUSH 100 UNIT/ML IV SOLN
500.0000 [IU] | INTRAVENOUS | Status: AC | PRN
  Administered 2020-11-22: 500 [IU]
  Filled 2020-11-22: qty 5

## 2020-11-22 MED ORDER — METRONIDAZOLE 500 MG/100ML IV SOLN
500.0000 mg | Freq: Once | INTRAVENOUS | Status: AC
  Administered 2020-11-22: 500 mg via INTRAVENOUS
  Filled 2020-11-22: qty 100

## 2020-11-22 MED ORDER — SODIUM CHLORIDE 0.9 % IV SOLN
2.0000 g | Freq: Once | INTRAVENOUS | Status: AC
  Administered 2020-11-22: 2 g via INTRAVENOUS
  Filled 2020-11-22: qty 2

## 2020-11-22 MED ORDER — LACTATED RINGERS IV SOLN: INTRAVENOUS | Status: DC

## 2020-11-22 NOTE — ED Provider Notes (Signed)
[facility] EMERGENCY DEPARTMENT Provider Note   CSN: [id number] Arrival date & time: 11/22/20  1950     History Chief Complaint  Patient presents with   Chest Pain   Shortness of Breath    [name] is a 50 y.o. female.  HPI 50 year old female history of CHF, DVT, on anticoagulation, hypertension, type 2 diabetes presents tonight with sudden onset of cough and shortness of breath.  States she was well earlier in the day.  About 6:00 she began feeling very short of breath.  EMS was called and her sats were noted to be 58%.  She was placed on nonrebreather mask and transported here to the emergency department.  She was noted to have a fever on arrival.  She was not aware prior to arrival that she had a fever.  She has not had any chills.  She has had some chest discomfort.  She has been doing her usual daily activities until this evening.  She has not noted feeling ill prior to this.  has chronic leg edema and has been in a boot on her left leg.     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 ([place]) 04/13/2020   Venous insufficiency 04/13/2020   Presence of IVC filter 04/13/2020   Chronic anemia 04/13/2020   Resistant hypertension 04/13/2020   (HFpEF) heart failure with preserved ejection fraction ([place]) 04/13/2020   CAD (coronary artery disease) 11/17/2019    Past Surgical History:  Procedure Laterality Date   ABDOMINAL WALL DEFECT REPAIR  1970   IR CV LINE INJECTION  10/24/2020   IVC FILTER INSERTION  2017   [place]   PORTACATH PLACEMENT  2014     OB History     Gravida  0   Para  0   Term  0   Preterm  0   AB  0   Living  0      SAB  0   IAB  0   Ectopic  0   Multiple  0   Live Births  0           Family History  Problem Relation Age of Onset   Diabetes Mellitus II Mother    COPD Father    Diabetes Father    Diabetes Mellitus II Maternal Grandmother    Breast cancer Paternal Grandfather     Social History   Tobacco Use   Smoking status: Former   Smokeless tobacco: Never  Scientific laboratory technician Use: Never used  Substance Use Topics   Alcohol use: Not Currently   Drug use: Not Currently    Home Medications Prior to Admission medications   Medication Sig Start Date End Date Taking? Authorizing Provider  acetaminophen (TYLENOL) 500 MG tablet Take 1 tablet (500 mg total) by mouth every 6 (six) hours as needed. 04/19/20  Yes Ladell Pier, MD  albuterol (PROAIR HFA) 108 (90 Base) MCG/ACT inhaler INHALE TWO PUFFS BY MOUTH EVERY 6 HOURS AS NEEDED FOR WHEEZING OR SHORTNESS OF BREATH Patient taking differently: Inhale 2 puffs into the lungs every 6 (six) hours as needed for wheezing or shortness of breath. 10/05/20  Yes Ladell Pier, MD  amLODipine (NORVASC) 5 MG tablet Take 0.5 tablets (2.5 mg total) by mouth daily. 10/26/20  Yes Ladell Pier, MD  apixaban (ELIQUIS) 5 MG TABS tablet Take 1 tablet (5 mg total) by mouth 2 (two) times daily. 10/26/20  Yes Ladell Pier, MD  atorvastatin (LIPITOR) 40 MG tablet Take 1 tablet (40 mg total) by mouth daily. 10/26/20  Yes Ladell Pier, MD  Carboxymethylcellulose Sodium (EYE DROPS OP) Place 1 drop into both eyes daily as needed (dry eyes).   Yes [provider]  carvedilol (COREG) 25 MG tablet TAKE ONE TABLET BY MOUTH TWICE A DAY WITH A MEAL Patient taking differently: Take 25 mg by mouth in the morning and at bedtime. 10/26/20  Yes Ladell Pier, MD  cholecalciferol (VITAMIN D3) 25 MCG (1000 UNIT) tablet Take 1 tablet (1,000 Units total) by mouth daily. 04/07/20  Yes Ladell Pier, MD  DULoxetine (CYMBALTA) 20 MG capsule Take 20 mg by mouth daily.   Yes [provider]  furosemide (LASIX) 80 MG tablet Take 1 tablet (80 mg total) by mouth daily. 10/26/20  Yes Ladell Pier, MD  gabapentin (NEURONTIN) 100 MG capsule Take 100 mg by mouth 3 (three) times daily as needed (pain).   Yes [provider]  hydrocortisone cream 1 % Apply 1 application topically daily as needed for itching.   Yes [provider]  KLOR-CON M20 20 MEQ tablet TAKE ONE TABLET BY MOUTH DAILY Patient taking differently: Take 20 mEq by mouth once. 11/08/20  Yes Ladell Pier, MD  methocarbamol (ROBAXIN) 500 MG tablet TAKE ONE TABLET BY MOUTH DAILY AS NEEDED FOR MUSCLE SPASMS Patient taking differently: Take 500 mg by mouth daily as needed for muscle spasms. 10/05/20  Yes Ladell Pier, MD  omeprazole (PRILOSEC) 20 MG capsule Take 20 mg by mouth daily as needed (acid reflux). 12/15/19  Yes [provider]  PARoxetine (PAXIL) 10 MG tablet TAKE ONE TABLET BY MOUTH DAILY Patient taking differently: Take 10 mg by mouth daily. 11/08/20  Yes  Ladell Pier, MD  predniSONE & diphenhydrAMINE (CONTRAST ALLERGY PREMED PACK) 3 x 50 MG & 1 x 50 MG KIT Take 50 mg prednisone at 2:30 am, 8:30 am, and 2:30 pm; Take Benadryl 50 mg at 2:30 PM 10/20/20  Yes Han, Aimee H, PA-C  spironolactone (ALDACTONE) 25 MG tablet Take 1 tablet (25 mg total) by mouth daily. 10/26/20  Yes Ladell Pier, MD  valsartan (DIOVAN) 80 MG tablet Take 1 tablet (80 mg total) by mouth daily. Patient taking differently: Take 80 mg by mouth every evening. 06/30/20  Yes Leonie Man, MD  vitamin B-12 (CYANOCOBALAMIN) 1000 MCG tablet Take 1,000 mcg by mouth daily.   Yes [provider]    Allergies    Ace inhibitors, Aspirin, Hydromorphone, Vancomycin, Contrast media [iodinated diagnostic agents], and Dilaudid [hydromorphone hcl]  Review of Systems   Review of Systems  All other systems reviewed and are negative.  Physical Exam Updated Vital Signs BP 117/69   Pulse (!) 113   Temp (!) 100.9 F (38.3 C) (Oral)   Resp 19   SpO2 100%   Physical Exam Vitals and nursing note reviewed.  Constitutional:      General: She is in acute distress.     Appearance: She is well-developed. She is obese. She is ill-appearing.  HENT:     Head: Normocephalic.  Eyes:     Pupils: Pupils are equal, round, and reactive to light.  Cardiovascular:     Rate and Rhythm: Regular rhythm. Tachycardia present.     Heart sounds: Normal heart sounds.  Pulmonary:     Effort: Tachypnea and respiratory distress present.     Breath sounds: Examination of the right-lower field reveals decreased breath sounds. Examination of the left-lower field reveals decreased breath sounds. Decreased breath sounds present.  Abdominal:     General: Bowel sounds are normal.     Palpations: Abdomen is soft.  Musculoskeletal:        General: Normal range of motion.     Cervical back: Normal range of motion.     Right lower leg: Edema present.     Left lower leg: Edema present.  Skin:     General: Skin is warm and dry.     Capillary Refill: Capillary refill takes less than 2 seconds.  Neurological:     General: No focal deficit present.     Mental Status: She is alert.  Psychiatric:        Mood and Affect: Mood normal.    ED Results / Procedures / Treatments   Labs (all labs ordered are listed, but only abnormal results are displayed) Labs Reviewed  COMPREHENSIVE METABOLIC PANEL - Abnormal; Notable for the following components:      Result Value   Glucose, Bld 155 (*)    Albumin 3.3 (*)    All other components within normal limits  CBC - Abnormal; Notable for the following components:   RBC 3.72 (*)    Hemoglobin 10.2 (*)    HCT 35.0 (*)    MCHC 29.1 (*)    Platelets 117 (*)    All other components within normal limits  I-STAT ARTERIAL BLOOD GAS, ED - Abnormal; Notable for the following components:   pO2, Arterial 181 (*)    Acid-Base Excess 3.0 (*)    Potassium 3.4 (*)    HCT 31.0 (*)    Hemoglobin 10.5 (*)    All other components within normal limits  RESP PANEL BY RT-PCR (FLU A&B, COVID) ARPGX2  CULTURE, BLOOD (ROUTINE X 2)  CULTURE, BLOOD (ROUTINE X 2)  LACTIC ACID, PLASMA  LACTIC ACID, PLASMA  URINALYSIS, ROUTINE W REFLEX MICROSCOPIC  I-STAT BETA HCG BLOOD, ED ([facility], [facility], [facility] ONLY)  TROPONIN I (HIGH SENSITIVITY)  TROPONIN I (HIGH SENSITIVITY)    EKG None  Radiology DG Chest Port 1 View  Result Date: 11/22/2020  CLINICAL DATA:  50 year old female with fever and shortness of breath. EXAM: PORTABLE CHEST 1 VIEW COMPARISON:  Chest radiograph dated 05/04/2020. FINDINGS: Right-sided Port-A-Cath with tip over the cavoatrial junction in similar position. No focal consolidation, pleural effusion, pneumothorax. There is cardiomegaly with central vascular prominence. Minimal left lung base density, likely atelectasis. No acute osseous pathology. IMPRESSION: Cardiomegaly with mild vascular congestion. No focal consolidation. Electronically Signed   By: Anner Crete M.D.   On: 11/22/2020 20:44    Procedures .Critical Care  Date/Time: 11/22/2020 11:07 PM Performed by: Pattricia Boss, MD Authorized by: Pattricia Boss, MD   Critical care provider statement:    Critical care time (minutes):  45   Critical care end time:  11/22/2020 11:07 PM   Critical care was time spent personally by me on the following activities:  Discussions with consultants, evaluation of patient's response to treatment, examination of patient, ordering and performing treatments and interventions, ordering and review of laboratory studies, ordering and review of radiographic studies, pulse oximetry, re-evaluation of patient's condition, obtaining history from patient or surrogate and review of old charts   Medications Ordered in ED Medications  sodium chloride flush (NS) 0.9 % injection 10-40 mL (has no administration in time range)  Chlorhexidine Gluconate Cloth 2 % PADS 6 each (has no administration in time range)  acetaminophen (TYLENOL) tablet 650 mg (650 mg Oral Given 11/22/20 2101)  lactated ringers bolus 1,000 mL (1,000 mLs Intravenous New Bag/Given 11/22/20 2108)  ceFEPIme (MAXIPIME) 2 g in sodium chloride 0.9 % 100 mL IVPB (0 g Intravenous Stopped 11/22/20 2144)  metroNIDAZOLE (FLAGYL) IVPB 500 mg (500 mg Intravenous New Bag/Given 11/22/20 2107)    ED Course  I have reviewed the triage vital signs and the nursing notes.  Pertinent labs & imaging results that were available during my care of the patient were reviewed by me and considered in my medical decision making (see chart for details).    MDM Rules/Calculators/A&P                          50 year old female history of DVT, on chronic anticoagulation, IVC filter in place, chronic anemia, hypertension, history of heart failure presents today with sudden onset of dyspnea and fever to 103.  On presentation she is tachycardic in the 130s, temp is 103, she was placed on nonrebreather and sats are 100%.  Blood pressure  125/100. Patient started on IV fluids, acetaminophen, broad-spectrum antibiotics including cefepime and Flagyl.  Vancomycin was not given due to history of allergy and low index of suspicion given clear chest x-Ingra Rother of obvious need for vancomycin. She has had 1 L fluid.  Her oxygen has been weaned down from nonrebreather to nasal cannula Blood pressures have remained stable systolically now at 706 heart rate has decreased to 114. CBC is significant for no leukocytosis, hemoglobin of 10, and platelets of 117. Electrolytes are within normal limits COVID test is negative Chest x-Ronnika Collett cardiomegaly with mild vascular congestion and no infiltrate noted Urinalysis is pending Initial lactic acid is 2.1 and additional IV fluids are ordered.  And plan admission for ongoing treatment and evaluation. Patient care with Dr. Tonie Griffith  He will see for admission.  D-dimer added in consultation.  Patient reports that she had a IVC filter placed in 2002 when she had failed anticoagulation on Coumadin.  She reports that she has not had any problems with blood clots since the IVC filter and that she takes her  Eliquis regularly. Final Clinical Impression(s) / ED Diagnoses Final diagnoses:  Sepsis, due to unspecified organism, unspecified whether acute organ dysfunction present [facility])    Rx / DC Orders ED Discharge Orders     None        Pattricia Boss, MD 11/22/20 2308

## 2020-11-22 NOTE — H&P (Addendum)
History and Physical    [name] KTG:[id number] DOB: 1970/10/31 DOA: 11/22/2020  PCP: Ladell Pier, MD   Patient coming from: Home  Chief Complaint: SOB  HPI: [name] is a 50 y.o. female with medical history significant for HFpEF, DVT on anticoagulation with Eliquis, HTN, asthma, iron deficiency anemia who presents by EMS with acute respiratory distress and hypoxia.  She states that this afternoon she went to the cancer center for a flush of her Port-A-Cath.  She receives iron infusions but has not had one in the last few months she states.  She reports after she arrived home she developed a cough and shortness of breath when she decided to lay down and take a nap.  Around 6 PM she had increased in her shortness of breath and EMS was called.  She was found to have an oxygen saturation of 58% on room air at that time was placed on a nonrebreather and brought to the hospital.  She was found to have a fever when she arrived to the hospital of 103 degrees.  She states that she did feel chills and had rigors at the time EMS was called out to her house.  He was feeling in her normal state of health earlier in the afternoon when she went for the flushing of her Port-A-Cath.  She reports she has had some chest discomfort but no pain stating that it felt like a mild pressure that did not radiate.  She reports having no nausea or vomiting.  She states she does have a headache at this time.  She reports she has chronic edema of her legs and has an Haematologist on her left leg.  She reports edema has been a little worse than normal recently.  She states he has been compliant with all of her medications including her Eliquis.  ED Course: In the emergency room she is been hemodynamically stable and had a negative chest x-ray and urinalysis.  WBCs were normal on the lab.  Electrolytes, renal function were normal.  Troponin was normal at 8.  Lactic acid was mildly elevated 2.1.  She had  no confusion or altered mental status and has no endorgan dysfunction with acute kidney injury.  She was given empiric antibiotics in the emergency room.  Blood cultures were obtained in the emergency room with her fever and rigors.  She was weaned from the nonrebreather and she is now on a nasal cannula.  She states that she takes albuterol at home 2-3 times a day but is not on any maintenance medication for asthma.  Hospitalist service has been asked to admit for further management  Review of Systems:  General: Denies weakness, weight loss, night sweats.  Denies dizziness.  Denies change in appetite HENT: Denies head trauma, denies change in hearing, tinnitus.  Denies nasal congestion or bleeding.  Denies sore throat, sores in mouth.  Denies difficulty swallowing Eyes: Denies blurry vision, pain in eye, drainage.  Denies discoloration of eyes. Neck: Denies pain.  Denies swelling.  Denies pain with movement. Cardiovascular: Denies chest pain, palpitations.  Denies edema.  Denies orthopnea Respiratory: Reports shortness of breath, cough. Reports wheezing.  Denies sputum production Gastrointestinal: Denies abdominal pain, swelling.  Denies nausea, vomiting, diarrhea.  Denies melena.  Denies hematemesis. Musculoskeletal: Denies limitation of movement.  Denies deformity or swelling.  Denies pain.  Denies arthralgias or myalgias. Genitourinary: Denies pelvic pain.  Denies urinary frequency or hesitancy.  Denies dysuria.  Skin: Denies rash.  Denies petechiae, purpura, ecchymosis. Neurological:  Denies syncope.  Denies seizure activity. Denies paresthesia. Denies slurred speech, drooping face.  Denies visual change. Psychiatric: Denies depression, anxiety. Denies hallucinations.  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   Morbid obesity due to excess calories ([place])    Osteoarthritis of left knee    Prediabetes    Small bowel obstruction (DISH)    as a child   Speech impediment    Stutter / stammer    Past Surgical History:  Procedure Laterality Date   ABDOMINAL WALL DEFECT REPAIR  1970   IR CV LINE INJECTION  10/24/2020   IVC FILTER INSERTION  2017   [place]  2014    Social History  reports that she has quit smoking. She has never used smokeless tobacco. She reports previous alcohol use. She reports previous drug use.  Allergies  Allergen Reactions   Ace Inhibitors Rash and Other (See Comments)    Make pt bleed   Aspirin Other (See Comments)    Per patient paperwork: blood clot?  Likely because of chronic DOAC   Hydromorphone Hives and Itching   Vancomycin Itching and Rash   Contrast Media [Iodinated Diagnostic Agents] Hives   Dilaudid [Hydromorphone Hcl] Hives    Family History  Problem Relation Age of Onset   Diabetes Mellitus II Mother    COPD Father    Diabetes Father    Diabetes Mellitus II Maternal Grandmother    Breast cancer Paternal Grandfather      Prior to Admission medications   Medication Sig Start Date End Date Taking? Authorizing Provider  acetaminophen (TYLENOL) 500 MG tablet Take 1 tablet (500 mg total) by mouth every 6 (six) hours as needed. 04/19/20  Yes Ladell Pier, MD  albuterol (PROAIR HFA) 108 (90 Base) MCG/ACT inhaler INHALE  TWO PUFFS BY MOUTH EVERY 6 HOURS AS NEEDED FOR WHEEZING OR SHORTNESS OF BREATH Patient taking differently: Inhale 2 puffs into the lungs every 6 (six) hours as needed for wheezing or shortness of breath. 10/05/20  Yes Ladell Pier, MD  amLODipine (NORVASC) 5 MG tablet Take 0.5 tablets (2.5 mg total) by mouth daily. 10/26/20  Yes Ladell Pier, MD  apixaban (ELIQUIS) 5 MG TABS tablet Take 1 tablet (5 mg total) by mouth 2 (two) times daily. 10/26/20  Yes Ladell Pier, MD  atorvastatin (LIPITOR) 40 MG tablet Take 1 tablet (40 mg total) by mouth daily. 10/26/20  Yes Ladell Pier, MD  Carboxymethylcellulose Sodium (EYE DROPS OP) Place 1 drop into both eyes daily as needed (dry eyes).   Yes [provider]  carvedilol (COREG) 25 MG tablet TAKE ONE TABLET BY MOUTH TWICE A DAY WITH  A MEAL Patient taking differently: Take 25 mg by mouth in the morning and at bedtime. 10/26/20  Yes Ladell Pier, MD  cholecalciferol (VITAMIN D3) 25 MCG (1000 UNIT) tablet Take 1 tablet (1,000 Units total) by mouth daily. 04/07/20  Yes Ladell Pier, MD  DULoxetine (CYMBALTA) 20 MG capsule Take 20 mg by mouth daily.   Yes [provider]  furosemide (LASIX) 80 MG tablet Take 1 tablet (80 mg total) by mouth daily. 10/26/20  Yes Ladell Pier, MD  gabapentin (NEURONTIN) 100 MG capsule Take 100 mg by mouth 3 (three) times daily as needed (pain).   Yes [provider]  hydrocortisone cream 1 % Apply 1 application topically daily as needed for itching.   Yes [provider]  KLOR-CON M20 20 MEQ tablet TAKE ONE TABLET BY MOUTH DAILY Patient taking differently: Take 20 mEq by mouth once. 11/08/20  Yes Ladell Pier, MD  methocarbamol (ROBAXIN) 500 MG tablet TAKE ONE TABLET BY MOUTH DAILY AS NEEDED FOR MUSCLE SPASMS Patient taking differently: Take 500 mg by mouth daily as needed for muscle spasms. 10/05/20  Yes Ladell Pier, MD  omeprazole (PRILOSEC) 20 MG  capsule Take 20 mg by mouth daily as needed (acid reflux). 12/15/19  Yes [provider]  PARoxetine (PAXIL) 10 MG tablet TAKE ONE TABLET BY MOUTH DAILY Patient taking differently: Take 10 mg by mouth daily. 11/08/20  Yes Ladell Pier, MD  predniSONE & diphenhydrAMINE (CONTRAST ALLERGY PREMED PACK) 3 x 50 MG & 1 x 50 MG KIT Take 50 mg prednisone at 2:30 am, 8:30 am, and 2:30 pm; Take Benadryl 50 mg at 2:30 PM 10/20/20  Yes Han, Aimee H, PA-C  spironolactone (ALDACTONE) 25 MG tablet Take 1 tablet (25 mg total) by mouth daily. 10/26/20  Yes Ladell Pier, MD  valsartan (DIOVAN) 80 MG tablet Take 1 tablet (80 mg total) by mouth daily. Patient taking differently: Take 80 mg by mouth every evening. 06/30/20  Yes Leonie Man, MD  vitamin B-12 (CYANOCOBALAMIN) 1000 MCG tablet Take 1,000 mcg by mouth daily.   Yes [provider]    Physical Exam: Vitals:   11/22/20 2157 11/22/20 2157 11/22/20 2230 11/22/20 2300  BP:  117/69 127/76 (!) 125/53  Pulse:  (!) 113 (!) 111 (!) 106  Resp:  19 15 16   Temp: (!) 100.9 F (38.3 C)     TempSrc: Oral     SpO2:  100% 99% 100%    Constitutional: NAD, calm, comfortable Vitals:   11/22/20 2157 11/22/20 2157 11/22/20 2230 11/22/20 2300  BP:  117/69 127/76 (!) 125/53  Pulse:  (!) 113 (!) 111 (!) 106  Resp:  19 15 16   Temp: (!) 100.9 F (38.3 C)     TempSrc: Oral     SpO2:  100% 99% 100%   General: WDWN, Alert and oriented x3.  Eyes: EOMI, PERRL, conjunctivae normal.  Sclera nonicteric HENT:  [place]/AT, external ears normal.  Nares patent without epistasis.  Mucous membranes are moist. Posterior pharynx clear of any exudate or lesions. Normal dentition.  Neck: Soft, normal range of motion, supple, no masses, no thyromegaly.  Trachea midline Respiratory: Minutes of breath sounds bilaterally.  Mild diffuse wheezing, no crackles. Normal respiratory effort. No accessory muscle use.  Cardiovascular: Regular rhythm, tachycardia.  No  murmurs / rubs / gallops.  Has bilateral lower extremity edema. trace pedal pulses. Port-A-Cath in sternum with no surrounding erythema. Abdomen: Soft, no tenderness, nondistended, no  rebound or guarding.  No masses palpated.  Obesity.  Bowel sounds normoactive Musculoskeletal: FROM. no cyanosis. No joint deformity upper and lower extremities. Normal muscle tone.  Skin: Warm, dry, intact no rashes, lesions, ulcers. No induration Neurologic: CN 2-12 grossly intact.  Normal speech.  Sensation intact to touch. Strength 5/5 in all extremities.   Psychiatric: Normal judgment and insight.  Normal mood.    Labs on Admission: I have personally reviewed following labs and imaging studies  CBC: Recent Labs  Lab 11/22/20 2021 11/22/20 2051  WBC 6.4  --   HGB 10.2* 10.5*  HCT 35.0* 31.0*  MCV 94.1  --   PLT 117*  --     Basic Metabolic Panel: Recent Labs  Lab 11/22/20 2021 11/22/20 2051  NA 135 140  K 3.5 3.4*  CL 102  --   CO2 26  --   GLUCOSE 155*  --   BUN 10  --   CREATININE 0.84  --   CALCIUM 8.9  --     GFR: CrCl cannot be calculated (Unknown ideal weight.).  Liver Function Tests: Recent Labs  Lab 11/22/20 2021  AST 16  ALT 11  ALKPHOS 69  BILITOT 0.9  PROT 7.1  ALBUMIN 3.3*    Urine analysis:    Component Value Date/Time   COLORURINE YELLOW 11/22/2020 2210   APPEARANCEUR CLEAR 11/22/2020 2210   LABSPEC 1.025 11/22/2020 2210   PHURINE 5.0 11/22/2020 2210   GLUCOSEU NEGATIVE 11/22/2020 2210   HGBUR NEGATIVE 11/22/2020 2210   BILIRUBINUR NEGATIVE 11/22/2020 2210   KETONESUR NEGATIVE 11/22/2020 2210   PROTEINUR NEGATIVE 11/22/2020 2210   NITRITE NEGATIVE 11/22/2020 2210   LEUKOCYTESUR NEGATIVE 11/22/2020 2210    Radiological Exams on Admission: DG Chest Port 1 View  Result Date: 11/22/2020 CLINICAL DATA:  50 year old female with fever and shortness of breath. EXAM: PORTABLE CHEST 1 VIEW COMPARISON:  Chest radiograph dated 05/04/2020. FINDINGS:  Right-sided Port-A-Cath with tip over the cavoatrial junction in similar position. No focal consolidation, pleural effusion, pneumothorax. There is cardiomegaly with central vascular prominence. Minimal left lung base density, likely atelectasis. No acute osseous pathology. IMPRESSION: Cardiomegaly with mild vascular congestion. No focal consolidation. Electronically Signed   By: Anner Crete M.D.   On: 11/22/2020 20:44    EKG: Independently reviewed.  EKG shows sinus tachycardia with heart rate in the 130s.  No acute ST elevation or depression.  QTC prolonged at 485  Assessment/Plan Principal Problem:   Acute respiratory failure with hypoxia  Ms. Falin presented by EMS on nonrebreather after having a oxygen saturation of 58% at home.  Weaned down to nasal cannula at this time and tolerating well. Continue to monitor closely on progressive care. Obtain CT of the chest without contrast for further evaluation of possible underlying pneumonia.  Patient does have decreased breath sounds.  She has history of asthma  Active Problems:   Fever Patient had temperature of 103 degrees when she arrived. Patient was given dose of cefepime and Flagyl in the emergency room.  She has no identified risk factors for anaerobic infection or MRSA. Blood cultures have been obtained. Continue cefepime overnight. Monitor blood cultures.    Asthma, chronic, moderate persistent, with acute exacerbation Continue albuterol as needed for shortness of breath, cough, wheeze.  Add Dulera 2 puffs twice daily with AeroChamber. Given Solu-Medrol every 8 hours x 3 doses.    (HFpEF) heart failure with preserved ejection fraction  Check BNP.  Continue Lasix and potassium that she takes  at home Obtain echocardiogram    Edema of both legs Chronic but patient reports increased level of swelling over the last week.  Check Doppler ultrasound to make sure is no DVT    Iron deficiency anemia due to chronic blood  loss Chronic and receives iron infusions at the cancer center.    History of venous thrombosis Is on Eliquis; states she has been compliant    DVT prophylaxis: Pt is on Eliquis for anticoagulation which is continued.   Code Status:   Full Code  Family Communication:  Diagnosis and plan discussed with patient.  Patient verbalized understanding agrees with plan.  Further recommendations to follow clinical indicated Disposition Plan:   Patient is from:  Home  Anticipated DC to:  Home  Anticipated DC date:  Anticipate 2 midnight or more stay in the hospital  Anticipated DC barriers: No barriers to discharge identified at this time    Admission status:  Inpatient   Yevonne Aline Tommey Barret MD [facility]  How to contact the [facility] Attending or Consulting provider [place] or covering provider during after hours [place], for this patient?   Check the care team in [facility] and look for a) attending/consulting [facility] provider listed and b) the [facility] team listed Log into [email or web address] and use [place]'s universal password to access. If you do not have the password, please contact the hospital operator. Locate the [facility] provider you are looking for under [facility] and page to a number that you can be directly reached. If you still have difficulty reaching the provider, please page the [facility] (Director on Call) for the Hospitalists listed on amion for assistance.  11/22/2020, 11:58 PM    Addendum: CT chest without contrast report called to me by the radiologist.  Patient has potential vascular disease and clot cannot be ruled out on the CT without contrast and radiology recommends CT angiography with contrast to better evaluate.  CTA is ordered with medication protocol for IV dye allergy placed.

## 2020-11-22 NOTE — ED Triage Notes (Signed)
Patient came home from getting her port flushed and had sudden onset of chest pain and shortness of breath.  Patient was found to be at 56% oxygen sats upon EMS arrival.  Patient continues with chest pain.  Patient does have audible wheezing, EMS states she had rales bilaterally.

## 2020-11-22 NOTE — Progress Notes (Signed)
Pharmacy Antibiotic Note  [name] is a 50 y.o. female admitted on 11/22/2020 with sepsis.  Pharmacy has been consulted for cefepime dosing.  Plan: Cefepime 2g q8h unless change in renal function F/u cultures De-escalate / tailor antibiotics as appropriate once source is determined     Temp (24hrs), Avg:103.2 F (39.6 C), Min:103.2 F (39.6 C), Max:103.2 F (39.6 C)  No results for input(s): WBC, CREATININE, LATICACIDVEN, VANCOTROUGH, VANCOPEAK, VANCORANDOM, GENTTROUGH, GENTPEAK, GENTRANDOM, TOBRATROUGH, TOBRAPEAK, TOBRARND, AMIKACINPEAK, AMIKACINTROU, AMIKACIN in the last 168 hours.  CrCl cannot be calculated (Patient's most recent lab result is older than the maximum 21 days allowed.).    Allergies  Allergen Reactions   Ace Inhibitors Rash and Other (See Comments)    Make pt bleed   Aspirin Other (See Comments)    Per patient paperwork: blood clot?  Likely because of chronic DOAC   Hydromorphone Hives and Itching   Vancomycin Itching and Rash   Contrast Media [Iodinated Diagnostic Agents] Hives   Dilaudid [Hydromorphone Hcl] Hives    Antimicrobials this admission: cefepime 7/27 >>   Microbiology results: Pending  Thank you for allowing pharmacy to be a part of this patient's care.  [name] 11/22/2020 9:07 PM

## 2020-11-23 ENCOUNTER — Encounter (HOSPITAL_BASED_OUTPATIENT_CLINIC_OR_DEPARTMENT_OTHER): Payer: Medicaid Other | Admitting: Internal Medicine

## 2020-11-23 ENCOUNTER — Inpatient Hospital Stay (HOSPITAL_COMMUNITY): Payer: Medicaid Other

## 2020-11-23 ENCOUNTER — Encounter (HOSPITAL_COMMUNITY): Payer: Self-pay | Admitting: Internal Medicine

## 2020-11-23 DIAGNOSIS — J4541 Moderate persistent asthma with (acute) exacerbation: Secondary | ICD-10-CM

## 2020-11-23 DIAGNOSIS — R079 Chest pain, unspecified: Secondary | ICD-10-CM | POA: Diagnosis not present

## 2020-11-23 DIAGNOSIS — J9600 Acute respiratory failure, unspecified whether with hypoxia or hypercapnia: Secondary | ICD-10-CM | POA: Diagnosis present

## 2020-11-23 DIAGNOSIS — I2510 Atherosclerotic heart disease of native coronary artery without angina pectoris: Secondary | ICD-10-CM | POA: Diagnosis not present

## 2020-11-23 DIAGNOSIS — R0602 Shortness of breath: Secondary | ICD-10-CM | POA: Diagnosis not present

## 2020-11-23 DIAGNOSIS — R509 Fever, unspecified: Secondary | ICD-10-CM | POA: Diagnosis not present

## 2020-11-23 DIAGNOSIS — L0390 Cellulitis, unspecified: Secondary | ICD-10-CM

## 2020-11-23 DIAGNOSIS — J9601 Acute respiratory failure with hypoxia: Secondary | ICD-10-CM

## 2020-11-23 DIAGNOSIS — Z86718 Personal history of other venous thrombosis and embolism: Secondary | ICD-10-CM | POA: Diagnosis not present

## 2020-11-23 DIAGNOSIS — I5033 Acute on chronic diastolic (congestive) heart failure: Secondary | ICD-10-CM | POA: Diagnosis not present

## 2020-11-23 DIAGNOSIS — A419 Sepsis, unspecified organism: Secondary | ICD-10-CM | POA: Diagnosis not present

## 2020-11-23 DIAGNOSIS — M7989 Other specified soft tissue disorders: Secondary | ICD-10-CM

## 2020-11-23 LAB — BASIC METABOLIC PANEL
Anion gap: 7 (ref 5–15)
BUN: 13 mg/dL (ref 6–20)
CO2: 26 mmol/L (ref 22–32)
Calcium: 9.2 mg/dL (ref 8.9–10.3)
Chloride: 103 mmol/L (ref 98–111)
Creatinine, Ser: 0.91 mg/dL (ref 0.44–1.00)
GFR, Estimated: >60 mL/min (ref >60)
Glucose, Bld: 210 mg/dL — ABNORMAL HIGH (ref 70–99)
Potassium: 3.8 mmol/L (ref 3.5–5.1)
Sodium: 136 mmol/L (ref 135–145)

## 2020-11-23 LAB — CBC
HCT: 36.2 % (ref 36.0–46.0)
Hemoglobin: 10.8 g/dL — ABNORMAL LOW (ref 12.0–15.0)
MCH: 27.9 pg (ref 26.0–34.0)
MCHC: 29.8 g/dL — ABNORMAL LOW (ref 30.0–36.0)
MCV: 93.5 fL (ref 80.0–100.0)
Platelets: 129 10*3/uL — ABNORMAL LOW (ref 150–400)
RBC: 3.87 MIL/uL (ref 3.87–5.11)
RDW: 14.5 % (ref 11.5–15.5)
WBC: 9.5 10*3/uL (ref 4.0–10.5)
nRBC: 0 % (ref 0.0–0.2)

## 2020-11-23 LAB — GLUCOSE, CAPILLARY
Glucose-Capillary: 248 mg/dL — ABNORMAL HIGH (ref 70–99)
Glucose-Capillary: 272 mg/dL — ABNORMAL HIGH (ref 70–99)

## 2020-11-23 LAB — TROPONIN I (HIGH SENSITIVITY)
Troponin I (High Sensitivity): 11 ng/L (ref <18)
Troponin I (High Sensitivity): 6 ng/L (ref <18)
Troponin I (High Sensitivity): 5 ng/L (ref <18)

## 2020-11-23 LAB — D-DIMER, QUANTITATIVE: D-Dimer, Quant: 1.07 ug/mL-FEU — ABNORMAL HIGH (ref 0.00–0.50)

## 2020-11-23 LAB — LACTIC ACID, PLASMA
Lactic Acid, Venous: 2.1 mmol/L (ref 0.5–1.9)
Lactic Acid, Venous: 1.1 mmol/L (ref 0.5–1.9)

## 2020-11-23 LAB — BRAIN NATRIURETIC PEPTIDE: B Natriuretic Peptide: 46.6 pg/mL (ref 0.0–100.0)

## 2020-11-23 MED ORDER — ACETAMINOPHEN 650 MG RE SUPP: 650.0000 mg | Freq: Four times a day (QID) | RECTAL | Status: DC | PRN

## 2020-11-23 MED ORDER — SPIRONOLACTONE 25 MG PO TABS
25.0000 mg | ORAL_TABLET | Freq: Every day | ORAL | Status: DC
  Administered 2020-11-23 – 2020-11-27 (×5): 25 mg via ORAL
  Filled 2020-11-23 (×5): qty 1

## 2020-11-23 MED ORDER — METHYLPREDNISOLONE SODIUM SUCC 125 MG IJ SOLR
125.0000 mg | Freq: Three times a day (TID) | INTRAMUSCULAR | Status: DC
  Administered 2020-11-23 (×2): 125 mg via INTRAVENOUS
  Filled 2020-11-23 (×2): qty 2

## 2020-11-23 MED ORDER — IOHEXOL 350 MG/ML SOLN
45.0000 mL | Freq: Once | INTRAVENOUS | Status: AC | PRN
  Administered 2020-11-23: 45 mL via INTRAVENOUS

## 2020-11-23 MED ORDER — DIPHENHYDRAMINE HCL 25 MG PO CAPS
50.0000 mg | ORAL_CAPSULE | Freq: Once | ORAL | Status: AC
  Administered 2020-11-23: 50 mg via ORAL
  Filled 2020-11-23: qty 2

## 2020-11-23 MED ORDER — GABAPENTIN 100 MG PO CAPS
100.0000 mg | ORAL_CAPSULE | Freq: Three times a day (TID) | ORAL | Status: DC | PRN
  Administered 2020-11-26: 100 mg via ORAL
  Filled 2020-11-23: qty 1

## 2020-11-23 MED ORDER — SODIUM CHLORIDE 0.9 % IV SOLN: 1.0000 g | Freq: Three times a day (TID) | INTRAVENOUS | Status: DC

## 2020-11-23 MED ORDER — METHYLPREDNISOLONE SODIUM SUCC 125 MG IJ SOLR
60.0000 mg | Freq: Three times a day (TID) | INTRAMUSCULAR | Status: AC
  Administered 2020-11-23: 60 mg via INTRAVENOUS
  Filled 2020-11-23: qty 2

## 2020-11-23 MED ORDER — ALBUTEROL SULFATE HFA 108 (90 BASE) MCG/ACT IN AERS: 2.0000 | INHALATION_SPRAY | Freq: Four times a day (QID) | RESPIRATORY_TRACT | Status: DC | PRN

## 2020-11-23 MED ORDER — ACETAMINOPHEN 325 MG PO TABS
650.0000 mg | ORAL_TABLET | Freq: Four times a day (QID) | ORAL | Status: DC | PRN
  Administered 2020-11-26: 650 mg via ORAL
  Filled 2020-11-23: qty 2

## 2020-11-23 MED ORDER — MOMETASONE FURO-FORMOTEROL FUM 200-5 MCG/ACT IN AERO
2.0000 | INHALATION_SPRAY | Freq: Two times a day (BID) | RESPIRATORY_TRACT | Status: DC
  Administered 2020-11-23 – 2020-11-27 (×9): 2 via RESPIRATORY_TRACT
  Filled 2020-11-23: qty 8.8

## 2020-11-23 MED ORDER — AMLODIPINE BESYLATE 5 MG PO TABS
2.5000 mg | ORAL_TABLET | Freq: Every day | ORAL | Status: DC
  Administered 2020-11-23 – 2020-11-27 (×5): 2.5 mg via ORAL
  Filled 2020-11-23 (×5): qty 1

## 2020-11-23 MED ORDER — CARVEDILOL 25 MG PO TABS
25.0000 mg | ORAL_TABLET | Freq: Two times a day (BID) | ORAL | Status: DC
  Administered 2020-11-23 – 2020-11-27 (×9): 25 mg via ORAL
  Filled 2020-11-23 (×2): qty 1
  Filled 2020-11-23: qty 2
  Filled 2020-11-23 (×6): qty 1

## 2020-11-23 MED ORDER — MORPHINE SULFATE (PF) 2 MG/ML IV SOLN
2.0000 mg | INTRAVENOUS | Status: DC | PRN
Start: 2020-11-23 — End: 2020-11-27
  Administered 2020-11-23 – 2020-11-27 (×12): 2 mg via INTRAVENOUS
  Filled 2020-11-23 (×14): qty 1

## 2020-11-23 MED ORDER — INSULIN ASPART 100 UNIT/ML IJ SOLN
0.0000 [IU] | Freq: Every day | INTRAMUSCULAR | Status: DC
  Administered 2020-11-23: 3 [IU] via SUBCUTANEOUS
  Administered 2020-11-24: 2 [IU] via SUBCUTANEOUS
  Administered 2020-11-25: 4 [IU] via SUBCUTANEOUS
  Administered 2020-11-26: 2 [IU] via SUBCUTANEOUS

## 2020-11-23 MED ORDER — IRBESARTAN 150 MG PO TABS
75.0000 mg | ORAL_TABLET | Freq: Every day | ORAL | Status: DC
Start: 2020-11-23 — End: 2020-11-27
  Administered 2020-11-23 – 2020-11-27 (×5): 75 mg via ORAL
  Filled 2020-11-23 (×5): qty 1

## 2020-11-23 MED ORDER — NITROGLYCERIN 0.4 MG SL SUBL
0.4000 mg | SUBLINGUAL_TABLET | SUBLINGUAL | Status: DC | PRN
  Filled 2020-11-23: qty 1

## 2020-11-23 MED ORDER — HYDROCORTISONE NA SUCCINATE PF 250 MG IJ SOLR
200.0000 mg | Freq: Once | INTRAMUSCULAR | Status: DC
  Filled 2020-11-23: qty 200

## 2020-11-23 MED ORDER — ATORVASTATIN CALCIUM 40 MG PO TABS
40.0000 mg | ORAL_TABLET | Freq: Every day | ORAL | Status: DC
  Administered 2020-11-23 – 2020-11-27 (×5): 40 mg via ORAL
  Filled 2020-11-23 (×5): qty 1

## 2020-11-23 MED ORDER — FUROSEMIDE 10 MG/ML IJ SOLN
80.0000 mg | Freq: Two times a day (BID) | INTRAMUSCULAR | Status: DC
  Administered 2020-11-23 – 2020-11-27 (×8): 80 mg via INTRAVENOUS
  Filled 2020-11-23 (×9): qty 8

## 2020-11-23 MED ORDER — FUROSEMIDE 20 MG PO TABS
80.0000 mg | ORAL_TABLET | Freq: Every day | ORAL | Status: DC
  Administered 2020-11-23: 80 mg via ORAL
  Filled 2020-11-23: qty 4

## 2020-11-23 MED ORDER — HYDROCORTISONE NA SUCCINATE PF 100 MG IJ SOLR
200.0000 mg | Freq: Once | INTRAMUSCULAR | Status: AC
  Administered 2020-11-23: 200 mg via INTRAVENOUS
  Filled 2020-11-23: qty 4

## 2020-11-23 MED ORDER — APIXABAN 5 MG PO TABS
5.0000 mg | ORAL_TABLET | Freq: Two times a day (BID) | ORAL | Status: DC
  Administered 2020-11-23 – 2020-11-27 (×10): 5 mg via ORAL
  Filled 2020-11-23 (×10): qty 1

## 2020-11-23 MED ORDER — DULOXETINE HCL 20 MG PO CPEP
20.0000 mg | ORAL_CAPSULE | Freq: Every day | ORAL | Status: DC
  Administered 2020-11-23 – 2020-11-27 (×5): 20 mg via ORAL
  Filled 2020-11-23 (×5): qty 1

## 2020-11-23 MED ORDER — DIPHENHYDRAMINE HCL 50 MG/ML IJ SOLN: 50.0000 mg | Freq: Once | INTRAMUSCULAR | Status: AC

## 2020-11-23 MED ORDER — POTASSIUM CHLORIDE CRYS ER 20 MEQ PO TBCR
20.0000 meq | EXTENDED_RELEASE_TABLET | Freq: Every day | ORAL | Status: DC
  Administered 2020-11-23 – 2020-11-27 (×5): 20 meq via ORAL
  Filled 2020-11-23 (×5): qty 1

## 2020-11-23 MED ORDER — LACTATED RINGERS IV SOLN: INTRAVENOUS | Status: DC

## 2020-11-23 MED ORDER — INSULIN ASPART 100 UNIT/ML IJ SOLN
0.0000 [IU] | Freq: Three times a day (TID) | INTRAMUSCULAR | Status: DC
  Administered 2020-11-24: 7 [IU] via SUBCUTANEOUS
  Administered 2020-11-24: 11 [IU] via SUBCUTANEOUS
  Administered 2020-11-24: 4 [IU] via SUBCUTANEOUS
  Administered 2020-11-25: 15 [IU] via SUBCUTANEOUS
  Administered 2020-11-25: 7 [IU] via SUBCUTANEOUS
  Administered 2020-11-25 – 2020-11-26 (×2): 4 [IU] via SUBCUTANEOUS
  Administered 2020-11-26: 20 [IU] via SUBCUTANEOUS
  Administered 2020-11-26: 11 [IU] via SUBCUTANEOUS
  Administered 2020-11-27 (×2): 4 [IU] via SUBCUTANEOUS

## 2020-11-23 NOTE — ED Notes (Signed)
Pt able to go to CT after 8:45. Benadryl to be administered within an hour of scan.

## 2020-11-23 NOTE — Progress Notes (Signed)
Patient ID: [name], female   DOB: 1970/09/27, 50 y.o.   MRN: [id number]  PROGRESS NOTE    [name]  [id number] DOB: April 08, 1971 DOA: 11/22/2020 PCP: Ladell Pier, MD   Brief Narrative:  50 y.o. female with medical history significant for HFpEF, DVT on anticoagulation with Eliquis, HTN, asthma, iron deficiency anemia who presented with worsening shortness of breath.  EMS had to put her on nonrebreather because her saturations were 58% on room air.  On presentation, she was febrile to 103F.  Chest x-ray was negative for infiltrates; UA was negative for UTI.  WBCs were normal with normal renal function.  She was given IV antibiotics empirically.  Assessment & Plan:   Fever -Questionable cause.  Presented with temperature of 103 F.  Chest x-ray negative for infiltrates; UA negative for UTI.  COVID-19 and influenza tests were negative.  CT chest without contrast showed findings of possible right heart failure/volume overload; no evidence of pulmonary embolism -Follow cultures.  DC IV fluids.  Continue cefepime empirically for now.  Asthma exacerbation -Slightly improving.  Degree Solu-Medrol to 60 mg IV every 8 hours.  Continue Dulera and nebs.  Acute hypoxic respiratory failure -Possibly from asthma exacerbation and CHF exacerbation.  Presented on a nonrebreather.  Respiratory status improving.  Currently on 2 to 3 L oxygen via nasal cannula.  Acute on chronic diastolic heart failure -Patient takes Lasix 80 mg p.o. daily at home.  We will switch to Lasix 80 mg IV every 12 hours.  CT chest showed findings of possible volume overload -Strict input and output.  Daily weights.  Fluid restriction.  Continue Coreg, irbesartan and spironolactone.  Outpatient follow-up with cardiology  Bilateral lower extremity edema -Patient has chronic bilateral lower extremity edema and has Unna boots.  Lower extremity duplex ultrasound was negative for DVT.  History of DVT  on Eliquis -Continue Eliquis  Hypokalemia -Improved  Thrombocytopenia -Questionable cause.  No signs of bleeding.  Monitor  Iron deficiency anemia/anemia of chronic disease -Receives iron infusions at cancer center.  Outpatient follow-up.  Hemoglobin stable.   DVT prophylaxis: Eliquis Code Status: Full Family Communication: None at bedside Disposition Plan: Status is: Inpatient  Remains inpatient appropriate because:Inpatient level of care appropriate due to severity of illness  Dispo: The patient is from: Home              Anticipated d/c is to: Home              Patient currently is not medically stable to d/c.   Difficult to place patient No  Consultants: None  Procedures: None  Antimicrobials: None   Subjective: Patient seen and examined at bedside.  Still feels short of breath with minimal exertion.  Feels only slightly better.  No current fever, nausea or vomiting.  Objective: Vitals:   11/23/20 0845 11/23/20 0849 11/23/20 1045 11/23/20 1100  BP: (!) 123/92 123/90 (!) 127/95 (!) 124/93  Pulse: 85  80 79  Resp: '18  18 20  '$ Temp:      TempSrc:      SpO2: 100%  99% 97%  Weight:      Height:        Intake/Output Summary (Last 24 hours) at 11/23/2020 1112 Last data filed at 11/23/2020 1100 Gross per 24 hour  Intake 3000 ml  Output 1200 ml  Net 1800 ml   Filed Weights   11/23/20 0349  Weight: (!) 145.2 kg    Examination:  General exam: Appears  calm and comfortable.  Currently on 2 to 3 L oxygen by nasal cannula. Respiratory system: Bilateral decreased breath sounds at bases with scattered crackles Cardiovascular system: S1 & S2 heard, Rate controlled Gastrointestinal system: Abdomen is morbidly obese, nondistended, soft and nontender. Normal bowel sounds heard. Extremities: No cyanosis, clubbing; lower extremity edema present; lower extremities wrapped bilaterally Central nervous system: Alert and oriented. No focal neurological deficits. Moving  extremities Skin: No obvious ecchymosis/lesions Psychiatry: Affect is flat mostly.    Data Reviewed: I have personally reviewed following labs and imaging studies  CBC: Recent Labs  Lab 11/22/20 2021 11/22/20 2051 11/23/20 0537  WBC 6.4  --  9.5  HGB 10.2* 10.5* 10.8*  HCT 35.0* 31.0* 36.2  MCV 94.1  --  93.5  PLT 117*  --  [id number]*   Basic Metabolic Panel: Recent Labs  Lab 11/22/20 2021 11/22/20 2051 11/23/20 0537  NA 135 140 136  K 3.5 3.4* 3.8  CL 102  --  103  CO2 26  --  26  GLUCOSE 155*  --  210*  BUN 10  --  13  CREATININE 0.84  --  0.91  CALCIUM 8.9  --  9.2   GFR: Estimated Creatinine Clearance: 107 mL/min (by C-G formula based on SCr of 0.91 mg/dL). Liver Function Tests: Recent Labs  Lab 11/22/20 2021  AST 16  ALT 11  ALKPHOS 69  BILITOT 0.9  PROT 7.1  ALBUMIN 3.3*   No results for input(s): LIPASE, AMYLASE in the last 168 hours. No results for input(s): AMMONIA in the last 168 hours. Coagulation Profile: No results for input(s): INR, PROTIME in the last 168 hours. Cardiac Enzymes: No results for input(s): CKTOTAL, CKMB, CKMBINDEX, TROPONINI in the last 168 hours. BNP (last 3 results) No results for input(s): PROBNP in the last 8760 hours. HbA1C: No results for input(s): HGBA1C in the last 72 hours. CBG: No results for input(s): GLUCAP in the last 168 hours. Lipid Profile: No results for input(s): CHOL, HDL, LDLCALC, TRIG, CHOLHDL, LDLDIRECT in the last 72 hours. Thyroid Function Tests: No results for input(s): TSH, T4TOTAL, FREET4, T3FREE, THYROIDAB in the last 72 hours. Anemia Panel: No results for input(s): VITAMINB12, FOLATE, FERRITIN, TIBC, IRON, RETICCTPCT in the last 72 hours. Sepsis Labs: Recent Labs  Lab 11/22/20 2104 11/22/20 2221  LATICACIDVEN 2.1* 1.1    Recent Results (from the past 240 hour(s))  Resp Panel by RT-PCR (Flu A&B, Covid) Nasopharyngeal Swab     Status: None   Collection Time: 11/22/20  8:30 PM   Specimen:  Nasopharyngeal Swab; Nasopharyngeal(NP) swabs in vial transport medium  Result Value Ref Range Status   SARS Coronavirus 2 by RT PCR NEGATIVE NEGATIVE Final    Comment: (NOTE) SARS-CoV-2 target nucleic acids are NOT DETECTED.  The SARS-CoV-2 RNA is generally detectable in upper respiratory specimens during the acute phase of infection. The lowest concentration of SARS-CoV-2 viral copies this assay can detect is 138 copies/mL. A negative result does not preclude SARS-Cov-2 infection and should not be used as the sole basis for treatment or other patient management decisions. A negative result may occur with  improper specimen collection/handling, submission of specimen other than nasopharyngeal swab, presence of viral mutation(s) within the areas targeted by this assay, and inadequate number of viral copies(<138 copies/mL). A negative result must be combined with clinical observations, patient history, and epidemiological information. The expected result is Negative.  Fact Sheet for Patients:  EntrepreneurPulse.com.au  Fact Sheet for Healthcare Providers:  IncredibleEmployment.be  This test is no t yet approved or cleared by the [place] and  has been authorized for detection and/or diagnosis of SARS-CoV-2 by FDA under an Emergency Use Authorization (EUA). This EUA will remain  in effect (meaning this test can be used) for the duration of the COVID-19 declaration under Section 564(b)(1) of the Act, 21 U.S.C.section 360bbb-3(b)(1), unless the authorization is terminated  or revoked sooner.       Influenza A by PCR NEGATIVE NEGATIVE Final   Influenza B by PCR NEGATIVE NEGATIVE Final    Comment: (NOTE) The Xpert Xpress SARS-CoV-2/FLU/RSV plus assay is intended as an aid in the diagnosis of influenza from Nasopharyngeal swab specimens and should not be used as a sole basis for treatment. Nasal washings and aspirates are unacceptable for  Xpert Xpress SARS-CoV-2/FLU/RSV testing.  Fact Sheet for Patients: EntrepreneurPulse.com.au  Fact Sheet for Healthcare Providers: IncredibleEmployment.be  This test is not yet approved or cleared by the [place] FDA and has been authorized for detection and/or diagnosis of SARS-CoV-2 by FDA under an Emergency Use Authorization (EUA). This EUA will remain in effect (meaning this test can be used) for the duration of the COVID-19 declaration under Section 564(b)(1) of the Act, 21 U.S.C. section 360bbb-3(b)(1), unless the authorization is terminated or revoked.  Performed at [facility], [place] [address] [place], [place] [address]          Radiology Studies: CT CHEST WO CONTRAST  Result Date: 11/23/2020 CLINICAL DATA:  Sudden onset chest pain after a port flush, hypoxia. EXAM: CT CHEST WITHOUT CONTRAST TECHNIQUE: Multidetector CT imaging of the chest was performed following the standard protocol without IV contrast. COMPARISON:  Radiograph 11/22/2020. FINDINGS: Cardiovascular: Limited evaluation the absence of contrast media. Cardiac size is top normal. No pericardial effusion. Coronary artery atherosclerosis. Central pulmonary arteries are borderline enlarged. Atherosclerotic plaque within the normal caliber aorta. No hyperdense mural thickening or plaque displacement. Accessed subcutaneous port catheter is seen with reservoir at the midline chest and a right IJ approach central venous catheter terminating at the level of the superior cavoatrial junction. Notable distension of the azygos. Additional note made of diffuse pulmonary vascular redistribution and cephalization. Mediastinum/Nodes: No mediastinal fluid or gas. Normal thyroid gland and thoracic inlet. No acute abnormality of the trachea or esophagus. No worrisome mediastinal or axillary adenopathy. Hilar nodal evaluation is limited in the absence of intravenous contrast media.  Lungs/Pleura: Pulmonary vascular redistribution. Some mild mosaic attenuation is noted which can be related to hypoventilatory change, small airways disease or air trapping. No focal consolidative process. No pneumothorax or layering effusion. No concerning pulmonary nodules or masses. Upper Abdomen: No acute abnormalities present in the visualized portions of the upper abdomen. Musculoskeletal: No acute osseous abnormality or suspicious osseous lesion. Multilevel degenerative changes are present in the imaged portions of the spine. IMPRESSION: 1. Marked distension of the azygos and vascular redistribution, can be seen in the setting of volume overload or right heart failure. Alternatively could reflect some degree of vascular collateralization particularly there is venous obstruction elsewhere. 2. Enlarged main pulmonary arteries, can reflect underlying pulmonary artery hypertension. Pulmonary embolism cannot be excluded given that luminal evaluation is precluded in the absence of contrast media. 3. Nonspecific mosaic attenuation, differential could include hypoventilatory change, air trapping or small airways disease. 4.  Aortic Atherosclerosis (ICD10-I70.0). 5. Coronary artery atherosclerosis. Electronically Signed   By: Lovena Le M.D.   On: 11/23/2020 01:13   CT Angio Chest Pulmonary Embolism (PE) W  or WO Contrast  Result Date: 11/23/2020 CLINICAL DATA:  50 year old female with chest pain, shortness of breath after Port-A-Cath flushing. EXAM: CT ANGIOGRAPHY CHEST WITH CONTRAST TECHNIQUE: Multidetector CT imaging of the chest was performed using the standard protocol during bolus administration of intravenous contrast. Multiplanar CT image reconstructions and MIPs were obtained to evaluate the vascular anatomy. CONTRAST:  Forty-five mL Omnipaque 350, intravenous COMPARISON:  11/23/2020, 10/24/2020 FINDINGS: Cardiovascular: Satisfactory opacification of the pulmonary arteries to the segmental level. No  evidence of pulmonary embolism. Mild dilation of the main portal vein measuring up to 30 mm. Prominent azygos vein measuring up to 35 mm at its apex. Due to contrast bolus timing, this is a limited evaluation for patency of this vein. Limited evaluation of the IVC appears within normal limits. Mild scattered atherosclerotic calcification of the thoracic aorta. Normal heart size. No pericardial effusion. Mediastinum/Nodes: Again seen is mid sternal Port-A-Cath with right IJ approach, tip terminating in the right atrium. No enlarged mediastinal, hilar, or axillary lymph nodes. Thyroid gland, trachea, and esophagus demonstrate no significant findings. Lungs/Pleura: Low lung volumes. No focal consolidations. No suspicious pulmonary nodules. No pleural effusion or pneumothorax. Upper Abdomen: The visualized upper abdomen is within normal limits. Musculoskeletal: No chest wall abnormality. No acute or significant osseous findings. Review of the MIP images confirms the above findings. IMPRESSION: Vascular: 1. No evidence of pulmonary embolism. 2. Prominent azygos vein as could be seen with sequela of pulmonary hypertension or caval/truncal venous anomaly. Likely clinically insignificant. 3. Unchanged appearance of mid sternal placed right IJ port. Non-Vascular: No acute intrathoracic abnormality. Ruthann Cancer, MD [facility] Electronically Signed   By: Ruthann Cancer MD   On: 11/23/2020 10:37   DG Chest Port 1 View  Result Date: 11/22/2020 CLINICAL DATA:  50 year old female with fever and shortness of breath. EXAM: PORTABLE CHEST 1 VIEW COMPARISON:  Chest radiograph dated 05/04/2020. FINDINGS: Right-sided Port-A-Cath with tip over the cavoatrial junction in similar position. No focal consolidation, pleural effusion, pneumothorax. There is cardiomegaly with central vascular prominence. Minimal left lung base density, likely atelectasis. No acute osseous  pathology. IMPRESSION: Cardiomegaly with mild vascular congestion. No focal consolidation. Electronically Signed   By: Anner Crete M.D.   On: 11/22/2020 20:44   VAS [place] LOWER EXTREMITY VENOUS (DVT)  Result Date: 11/23/2020  Lower Venous DVT Study Patient Name:  JEHLANI BREARLEY  Date of Exam:   11/23/2020 Medical Rec #: [id number]                Accession #:    [id number] Date of Birth: December 14, 1970                 Patient Gender: F Patient Age:   50Y Exam Location:  [facility] Procedure:      VAS [place] LOWER EXTREMITY VENOUS (DVT) Referring Phys: [id number] [place] --------------------------------------------------------------------------------  Indications: Swelling, LT leg ulceration.  Anticoagulation: Eliquis. Limitations: Body habitus, poor ultrasound/tissue interface and bandages. Comparison Study: 06-23-2020 Lower extremity reflux bilateral showed reflux of                   the RT GSV, otherwise negative. Performing Technologist: Darlin Coco RDMS,RVT  Examination Guidelines: A complete evaluation includes B-mode imaging, spectral Doppler, color Doppler, and power Doppler as needed of all accessible portions of each vessel. Bilateral testing is considered an integral part of a complete examination. Limited examinations for reoccurring indications may be performed as noted. The reflux  portion of the exam is performed with the patient in reverse Trendelenburg.  +---------+---------------+---------+-----------+----------+---------------+ RIGHT    CompressibilityPhasicitySpontaneityPropertiesThrombus Aging  +---------+---------------+---------+-----------+----------+---------------+ CFV      Full           Yes      Yes                                  +---------+---------------+---------+-----------+----------+---------------+ SFJ      Full                                                          +---------+---------------+---------+-----------+----------+---------------+ FV Prox  Full                                                         +---------+---------------+---------+-----------+----------+---------------+ FV Mid   Full                                                         +---------+---------------+---------+-----------+----------+---------------+ FV DistalFull                                                         +---------+---------------+---------+-----------+----------+---------------+ PFV      Full                                                         +---------+---------------+---------+-----------+----------+---------------+ POP      Full           Yes      Yes                                  +---------+---------------+---------+-----------+----------+---------------+ PTV                     Yes      Yes                  Patent by color +---------+---------------+---------+-----------+----------+---------------+ PERO                    Yes      Yes                  Patent by color +---------+---------------+---------+-----------+----------+---------------+ Gastroc  Full                                                         +---------+---------------+---------+-----------+----------+---------------+   +---------+---------------+---------+-----------+----------+-------------------+  LEFT     CompressibilityPhasicitySpontaneityPropertiesThrombus Aging      +---------+---------------+---------+-----------+----------+-------------------+ CFV      Full           Yes      Yes                                      +---------+---------------+---------+-----------+----------+-------------------+ SFJ      Full                                                             +---------+---------------+---------+-----------+----------+-------------------+ FV Prox  Full                                                              +---------+---------------+---------+-----------+----------+-------------------+ FV Mid   Full                                                             +---------+---------------+---------+-----------+----------+-------------------+ FV Distal               Yes      Yes                  Patent by color     +---------+---------------+---------+-----------+----------+-------------------+ PFV      Full                                                             +---------+---------------+---------+-----------+----------+-------------------+ POP      Full           Yes      Yes                                      +---------+---------------+---------+-----------+----------+-------------------+ PTV                                                   Not visualized                                                            -bandaging          +---------+---------------+---------+-----------+----------+-------------------+ PERO  Not visualized                                                            -bandaging          +---------+---------------+---------+-----------+----------+-------------------+ Gastroc  Full                                                             +---------+---------------+---------+-----------+----------+-------------------+     Summary: RIGHT: - There is no evidence of deep vein thrombosis in the lower extremity. However, portions of this examination were limited- see technologist comments above.  - No cystic structure found in the popliteal fossa.  LEFT: - There is no evidence of deep vein thrombosis in the lower extremity. However, portions of this examination were limited- see technologist comments above.  - No cystic structure found in the popliteal fossa.  *See table(s) above for measurements and observations.    Preliminary         Scheduled Meds:  amLODipine  2.5  mg Oral Daily   apixaban  5 mg Oral BID   atorvastatin  40 mg Oral Daily   carvedilol  25 mg Oral BID WC   Chlorhexidine Gluconate Cloth  6 each Topical Daily   DULoxetine  20 mg Oral Daily   furosemide  80 mg Oral Daily   irbesartan  75 mg Oral Daily   methylPREDNISolone (SOLU-MEDROL) injection  125 mg Intravenous Q8H   mometasone-formoterol  2 puff Inhalation BID   potassium chloride SA  20 mEq Oral Daily   spironolactone  25 mg Oral Daily   Continuous Infusions:  ceFEPime (MAXIPIME) IV Stopped (11/23/20 0711)   lactated ringers     lactated ringers     lactated ringers 75 mL/hr at 11/23/20 0424          Aline August, MD [facility] 11/23/2020, 11:12 AM

## 2020-11-23 NOTE — ED Notes (Signed)
Pt given gingerale and sandwich bag.

## 2020-11-23 NOTE — Progress Notes (Signed)
Pt weaned to room air sustaining O2 saturation of 98%. Pt previously on 2L.

## 2020-11-23 NOTE — ED Notes (Signed)
Attempted to call report

## 2020-11-23 NOTE — Evaluation (Signed)
Physical Therapy Evaluation Patient Details Name: [name] MRN: [id number] DOB: 1970-05-28 Today's Date: 11/23/2020   History of Present Illness  50 y.o. female presents to [facility] ED on 7/27 with SOB, fever, hypoxia. PMH includes HFpEF, DVT on anticoagulation with Eliquis, HTN, asthma, iron deficiency anemia.  Clinical Impression  Pt presents to PT with deficits in activity tolerance, gait, and dynamic balance. Pt is not far from baseline, ambulating for household and limited community distances this session. Pt does not require physical assistance at this time. Pt will benefit from continued acute PT services to improve activity tolerance and further assess dynamic balance as pt does utilize a cane for ambulation at times.    Follow Up Recommendations No PT follow up    Equipment Recommendations  None recommended by PT    Recommendations for Other Services       Precautions / Restrictions Precautions Precautions: Fall Restrictions Weight Bearing Restrictions: No      Mobility  Bed Mobility Overal bed mobility: Modified Independent             General bed mobility comments: increased time, HOB elevated, use of rails    Transfers Overall transfer level: Modified independent Equipment used: Rolling walker (2 wheeled) Transfers: Sit to/from Stand Sit to Stand: Modified independent (Device/Increase time)            Ambulation/Gait Ambulation/Gait assistance: Supervision Gait Distance (Feet): 250 Feet Assistive device: Rolling walker (2 wheeled) Gait Pattern/deviations: Step-through pattern;Trunk flexed Gait velocity: reduced Gait velocity interpretation: 1.31 - 2.62 ft/sec, indicative of limited community ambulator General Gait Details: pt with slowed step-through gait, increased trunk flexion with anterior displacement of RW with fatigue  Stairs            Wheelchair Mobility    Modified Rankin (Stroke Patients Only)       Balance  Overall balance assessment: Needs assistance Sitting-balance support: No upper extremity supported;Feet supported Sitting balance-Leahy Scale: Good     Standing balance support: No upper extremity supported;During functional activity Standing balance-Leahy Scale: Good Standing balance comment: able to wash hands at sink without UE support                             Pertinent Vitals/Pain Pain Assessment: Faces Faces Pain Scale: Hurts little more Pain Location: soreness in feet Pain Descriptors / Indicators: Sore Pain Intervention(s): Monitored during session    Home Living Family/patient expects to be discharged to:: Private residence Living Arrangements: Parent Available Help at Discharge: Family;Available 24 hours/day Type of Home: Apartment Home Access: Level entry     Home Layout: One level Home Equipment: Walker - 2 wheels;Cane - single point      Prior Function Level of Independence: Independent with assistive device(s)         Comments: pt reports ambulating with use of walker or cane, does not drive     Hand Dominance        Extremity/Trunk Assessment   Upper Extremity Assessment Upper Extremity Assessment: Overall WFL for tasks assessed    Lower Extremity Assessment Lower Extremity Assessment: Generalized weakness    Cervical / Trunk Assessment Cervical / Trunk Assessment: Other exceptions Cervical / Trunk Exceptions: morbid obesity  Communication   Communication: No difficulties  Cognition Arousal/Alertness: Awake/alert Behavior During Therapy: WFL for tasks assessed/performed Overall Cognitive Status: Within Functional Limits for tasks assessed  General Comments General comments (skin integrity, edema, etc.): pt on RA upon arrival, HR stable. SpO2 reading is unreliable during ambulation due to pt gripping RW. Pt denies SOB. Does report period of dizziness near completion of  ambulation, this resolves with sitting in recliner and BP stable at 127/75    Exercises     Assessment/Plan    PT Assessment Patient needs continued PT services  PT Problem List Decreased strength;Decreased activity tolerance;Decreased balance;Decreased mobility;Cardiopulmonary status limiting activity       PT Treatment Interventions DME instruction;Gait training;Therapeutic exercise;Therapeutic activities;Balance training;Functional mobility training;Patient/family education    PT Goals (Current goals can be found in the Care Plan section)  Acute Rehab PT Goals Patient Stated Goal: to return to independence and go home PT Goal Formulation: With patient Time For Goal Achievement: 12/07/20 Potential to Achieve Goals: Good Additional Goals Additional Goal #1: Pt will ambulate for >250' with use of the LRAD at a modI level, reporting a DOE of 3/10 or less to indicate improved activity tolerance    Frequency Min 3X/week   Barriers to discharge        Co-evaluation               AM-PAC PT "6 Clicks" Mobility  Outcome Measure Help needed turning from your back to your side while in a flat bed without using bedrails?: None Help needed moving from lying on your back to sitting on the side of a flat bed without using bedrails?: None Help needed moving to and from a bed to a chair (including a wheelchair)?: None Help needed standing up from a chair using your arms (e.g., wheelchair or bedside chair)?: None Help needed to walk in hospital room?: A Little Help needed climbing 3-5 steps with a railing? : A Lot 6 Click Score: 21    End of Session   Activity Tolerance: Patient tolerated treatment well Patient left: in chair;with call bell/phone within reach Nurse Communication: Mobility status PT Visit Diagnosis: Other abnormalities of gait and mobility (R26.89)    Time: [id number] PT Time Calculation (min) (ACUTE ONLY): 22 min   Charges:   PT Evaluation $PT Eval Low  Complexity: [place], PT, DPT Acute Rehabilitation Pager: [phone]   Zenaida Niece 11/23/2020, 5:39 PM

## 2020-11-23 NOTE — Progress Notes (Signed)
Lower extremity venous bilateral study completed.   Please see CV Proc for preliminary results.   Kainan Patty, RDMS, RVT  

## 2020-11-23 NOTE — ED Notes (Signed)
Pt weaned off of Non-rebreather. Now on 2L [place].

## 2020-11-23 NOTE — ED Notes (Signed)
Report given to Sharee Holster, RN of [phone]

## 2020-11-24 DIAGNOSIS — R509 Fever, unspecified: Secondary | ICD-10-CM | POA: Diagnosis not present

## 2020-11-24 DIAGNOSIS — I700 Atherosclerosis of aorta: Secondary | ICD-10-CM

## 2020-11-24 DIAGNOSIS — Z86718 Personal history of other venous thrombosis and embolism: Secondary | ICD-10-CM | POA: Diagnosis not present

## 2020-11-24 DIAGNOSIS — I5033 Acute on chronic diastolic (congestive) heart failure: Secondary | ICD-10-CM | POA: Diagnosis not present

## 2020-11-24 DIAGNOSIS — D500 Iron deficiency anemia secondary to blood loss (chronic): Secondary | ICD-10-CM

## 2020-11-24 DIAGNOSIS — J9601 Acute respiratory failure with hypoxia: Secondary | ICD-10-CM | POA: Diagnosis not present

## 2020-11-24 DIAGNOSIS — J4541 Moderate persistent asthma with (acute) exacerbation: Secondary | ICD-10-CM | POA: Diagnosis not present

## 2020-11-24 LAB — GLUCOSE, CAPILLARY
Glucose-Capillary: 262 mg/dL — ABNORMAL HIGH (ref 70–99)
Glucose-Capillary: 248 mg/dL — ABNORMAL HIGH (ref 70–99)
Glucose-Capillary: 185 mg/dL — ABNORMAL HIGH (ref 70–99)
Glucose-Capillary: 239 mg/dL — ABNORMAL HIGH (ref 70–99)

## 2020-11-24 LAB — PROCALCITONIN: Procalcitonin: 1.39 ng/mL

## 2020-11-24 LAB — HEMOGLOBIN A1C
Hgb A1c MFr Bld: 6.5 % — ABNORMAL HIGH (ref 4.8–5.6)
Mean Plasma Glucose: 139.85 mg/dL

## 2020-11-24 MED ORDER — METHYLPREDNISOLONE SODIUM SUCC 40 MG IJ SOLR
40.0000 mg | Freq: Three times a day (TID) | INTRAMUSCULAR | Status: AC
  Administered 2020-11-24: 40 mg via INTRAVENOUS
  Filled 2020-11-24: qty 1

## 2020-11-24 NOTE — Progress Notes (Signed)
Patient ID: [name], female   DOB: 1971-01-29, 50 y.o.   MRN: [id number]  PROGRESS NOTE    Nayleen Cowing  [id number] DOB: Jan 09, 1971 DOA: 11/22/2020 PCP: Ladell Pier, MD   Brief Narrative:  50 y.o. female with medical history significant for HFpEF, DVT on anticoagulation with Eliquis, HTN, asthma, iron deficiency anemia who presented with worsening shortness of breath.  EMS had to put her on nonrebreather because her saturations were 58% on room air.  On presentation, she was febrile to 103F.  Chest x-ray was negative for infiltrates; UA was negative for UTI.  WBCs were normal with normal renal function.  She was given IV antibiotics empirically.  Assessment & Plan:   Fever -Questionable cause.  Presented with temperature of 103 F.  Chest x-ray negative for infiltrates; UA negative for UTI.  COVID-19 and influenza tests were negative.  CT chest without contrast showed findings of possible right heart failure/volume overload; no evidence of pulmonary embolism -Currently on empiric cefepime; continue cefepime for another 24 hours.  Off IV fluids.  Cultures negative so far -Afebrile for the last 24 hours.  Asthma exacerbation -Slightly improving.  Decrease Solu-Medrol to 40 mg IV every 8 hours.  Continue Dulera and nebs.  Acute hypoxic respiratory failure -Possibly from asthma exacerbation and CHF exacerbation.  Presented on a nonrebreather.  Respiratory status improving.  Currently on room air.  Acute on chronic diastolic heart failure Chest pain -Patient takes Lasix 80 mg p.o. daily at home.  CT chest showed findings of possible volume overload -Strict input and output.  Daily weights.  Fluid restriction.  Continue Coreg, irbesartan and spironolactone.  Outpatient follow-up with cardiology -Continue IV Lasix 80 mg twice a day for 1 more day at least. -Patient continues to have intermittent chest pain; troponins have been negative so far.  We will request  cardiology evaluation.  Bilateral lower extremity edema -Patient has chronic bilateral lower extremity edema and has Unna boots.  Lower extremity duplex ultrasound was negative for DVT.  History of DVT on Eliquis -Continue Eliquis  Hypokalemia -No labs today.  Repeat a.m. labs.  Thrombocytopenia -Questionable cause.  No signs of bleeding.  Monitor  Iron deficiency anemia/anemia of chronic disease -Receives iron infusions at cancer center.  Outpatient follow-up.  Hemoglobin stable.  Diabetes mellitus type 2 with hyperglycemia -A1c 6.5.  Continue CBGs with SSI.  Carb modified diet.   DVT prophylaxis: Eliquis Code Status: Full Family Communication: None at bedside Disposition Plan: Status is: Inpatient  Remains inpatient appropriate because:Inpatient level of care appropriate due to severity of illness  Dispo: The patient is from: Home              Anticipated d/c is to: Home              Patient currently is not medically stable to d/c.   Difficult to place patient No  Consultants: None  Procedures: None  Antimicrobials:  Anti-infectives (From admission, onward)    Start     Dose/Rate Route Frequency Ordered Stop   11/23/20 0600  ceFEPIme (MAXIPIME) 2 g in sodium chloride 0.9 % 100 mL IVPB        2 g 200 mL/hr over 30 Minutes Intravenous Every 8 hours 11/22/20 2313     11/23/20 0015  ceFEPIme (MAXIPIME) 1 g in sodium chloride 0.9 % 100 mL IVPB  Status:  Discontinued        1 g 200 mL/hr over 30 Minutes Intravenous Every 8  hours 11/23/20 0001 11/23/20 0011   11/22/20 2045  ceFEPIme (MAXIPIME) 2 g in sodium chloride 0.9 % 100 mL IVPB        2 g 200 mL/hr over 30 Minutes Intravenous  Once 11/22/20 2035 11/22/20 2144   11/22/20 2045  metroNIDAZOLE (FLAGYL) IVPB 500 mg        500 mg 100 mL/hr over 60 Minutes Intravenous  Once 11/22/20 2035 11/22/20 2234         Subjective: Patient seen and examined at bedside.  Breathing is improving.  Does not feel well and  complains of chest pain.  Does not feel ready to go home today.  Denies any fever.  Still short of breath with exertion.  No overnight nausea, vomiting or worsening abdominal pain reported. Objective: Vitals:   11/23/20 1402 11/23/20 1500 11/23/20 1915 11/24/20 0348  BP:  131/80 110/86 124/77  Pulse: 86 84 84   Resp: (!) '22 19 15   '$ Temp:   97.7 F (36.5 C) 97.7 F (36.5 C)  TempSrc:   Oral Oral  SpO2: 95% 98% 98%   Weight:    (!) 161.6 kg  Height:        Intake/Output Summary (Last 24 hours) at 11/24/2020 0729 Last data filed at 11/24/2020 0353 Gross per 24 hour  Intake 200 ml  Output 3450 ml  Net -3250 ml    Filed Weights   11/23/20 0349 11/24/20 0348  Weight: (!) 145.2 kg (!) 161.6 kg    Examination:  General exam: No distress.  On room air currently. Respiratory system: Decreased breath sounds at bases bilaterally cardiovascular system: Rate controlled, S1-S2 heard Gastrointestinal system: Abdomen is morbidly obese, distended, soft and nontender.  Bowel sounds are heard  extremities: Mild lower extremity IMA present; no cyanosis; lower extremities wrapped bilaterally Central nervous system: Awake and alert.  No focal neurological deficits.  Moves extremities  skin: No obvious petechiae/rashes  psychiatry: Flat affect.  Intermittently looks anxious   Data Reviewed: I have personally reviewed following labs and imaging studies  CBC: Recent Labs  Lab 11/22/20 2021 11/22/20 2051 11/23/20 0537  WBC 6.4  --  9.5  HGB 10.2* 10.5* 10.8*  HCT 35.0* 31.0* 36.2  MCV 94.1  --  93.5  PLT 117*  --  129*    Basic Metabolic Panel: Recent Labs  Lab 11/22/20 2021 11/22/20 2051 11/23/20 0537  NA 135 140 136  K 3.5 3.4* 3.8  CL 102  --  103  CO2 26  --  26  GLUCOSE 155*  --  210*  BUN 10  --  13  CREATININE 0.84  --  0.91  CALCIUM 8.9  --  9.2    GFR: Estimated Creatinine Clearance: 114.4 mL/min (by C-G formula based on SCr of 0.91 mg/dL). Liver Function  Tests: Recent Labs  Lab 11/22/20 2021  AST 16  ALT 11  ALKPHOS 69  BILITOT 0.9  PROT 7.1  ALBUMIN 3.3*    No results for input(s): LIPASE, AMYLASE in the last 168 hours. No results for input(s): AMMONIA in the last 168 hours. Coagulation Profile: No results for input(s): INR, PROTIME in the last 168 hours. Cardiac Enzymes: No results for input(s): CKTOTAL, CKMB, CKMBINDEX, TROPONINI in the last 168 hours. BNP (last 3 results) No results for input(s): PROBNP in the last 8760 hours. HbA1C: Recent Labs    11/24/20 0418  HGBA1C 6.5*   CBG: Recent Labs  Lab 11/23/20 1739 11/23/20 2111  GLUCAP 248* 272*  Lipid Profile: No results for input(s): CHOL, HDL, LDLCALC, TRIG, CHOLHDL, LDLDIRECT in the last 72 hours. Thyroid Function Tests: No results for input(s): TSH, T4TOTAL, FREET4, T3FREE, THYROIDAB in the last 72 hours. Anemia Panel: No results for input(s): VITAMINB12, FOLATE, FERRITIN, TIBC, IRON, RETICCTPCT in the last 72 hours. Sepsis Labs: Recent Labs  Lab 11/22/20 2104 11/22/20 2221  LATICACIDVEN 2.1* 1.1     Recent Results (from the past 240 hour(s))  Blood culture (routine x 2)     Status: None (Preliminary result)   Collection Time: 11/22/20  8:25 PM   Specimen: BLOOD  Result Value Ref Range Status   Specimen Description BLOOD LEFT ANTECUBITAL  Final   Special Requests   Final    BOTTLES DRAWN AEROBIC AND ANAEROBIC Blood Culture adequate volume   Culture   Final    NO GROWTH < 24 HOURS Performed at [facility], [place] [address] [place], [place] [address]    Report Status PENDING  Incomplete  Resp Panel by RT-PCR (Flu A&B, Covid) Nasopharyngeal Swab     Status: None   Collection Time: 11/22/20  8:30 PM   Specimen: Nasopharyngeal Swab; Nasopharyngeal(NP) swabs in vial transport medium  Result Value Ref Range Status   SARS Coronavirus 2 by RT PCR NEGATIVE NEGATIVE Final    Comment: (NOTE) SARS-CoV-2 target nucleic acids are NOT DETECTED.  The  SARS-CoV-2 RNA is generally detectable in upper respiratory specimens during the acute phase of infection. The lowest concentration of SARS-CoV-2 viral copies this assay can detect is 138 copies/mL. A negative result does not preclude SARS-Cov-2 infection and should not be used as the sole basis for treatment or other patient management decisions. A negative result may occur with  improper specimen collection/handling, submission of specimen other than nasopharyngeal swab, presence of viral mutation(s) within the areas targeted by this assay, and inadequate number of viral copies(<138 copies/mL). A negative result must be combined with clinical observations, patient history, and epidemiological information. The expected result is Negative.  Fact Sheet for Patients:  EntrepreneurPulse.com.au  Fact Sheet for Healthcare Providers:  IncredibleEmployment.be  This test is no t yet approved or cleared by the [place] FDA and  has been authorized for detection and/or diagnosis of SARS-CoV-2 by FDA under an Emergency Use Authorization (EUA). This EUA will remain  in effect (meaning this test can be used) for the duration of the COVID-19 declaration under Section 564(b)(1) of the Act, 21 U.S.C.section 360bbb-3(b)(1), unless the authorization is terminated  or revoked sooner.       Influenza A by PCR NEGATIVE NEGATIVE Final   Influenza B by PCR NEGATIVE NEGATIVE Final    Comment: (NOTE) The Xpert Xpress SARS-CoV-2/FLU/RSV plus assay is intended as an aid in the diagnosis of influenza from Nasopharyngeal swab specimens and should not be used as a sole basis for treatment. Nasal washings and aspirates are unacceptable for Xpert Xpress SARS-CoV-2/FLU/RSV testing.  Fact Sheet for Patients: EntrepreneurPulse.com.au  Fact Sheet for Healthcare Providers: IncredibleEmployment.be  This test is not yet approved or  cleared by the [place] FDA and has been authorized for detection and/or diagnosis of SARS-CoV-2 by FDA under an Emergency Use Authorization (EUA). This EUA will remain in effect (meaning this test can be used) for the duration of the COVID-19 declaration under Section 564(b)(1) of the Act, 21 U.S.C. section 360bbb-3(b)(1), unless the authorization is terminated or revoked.  Performed at [facility], [place] [name], [place], [place] [address]   Blood  culture (routine x 2)     Status: None (Preliminary result)   Collection Time: 11/22/20  8:32 PM   Specimen: BLOOD  Result Value Ref Range Status   Specimen Description BLOOD LEFT ANTECUBITAL  Final   Special Requests   Final    BOTTLES DRAWN AEROBIC AND ANAEROBIC Blood Culture adequate volume   Culture   Final    NO GROWTH < 24 HOURS Performed at [facility], [place] [address], [place], [place] [address]    Report Status PENDING  Incomplete          Radiology Studies: CT CHEST WO CONTRAST  Result Date: 11/23/2020 CLINICAL DATA:  Sudden onset chest pain after a port flush, hypoxia. EXAM: CT CHEST WITHOUT CONTRAST TECHNIQUE: Multidetector CT imaging of the chest was performed following the standard protocol without IV contrast. COMPARISON:  Radiograph 11/22/2020. FINDINGS: Cardiovascular: Limited evaluation the absence of contrast media. Cardiac size is top normal. No pericardial effusion. Coronary artery atherosclerosis. Central pulmonary arteries are borderline enlarged. Atherosclerotic plaque within the normal caliber aorta. No hyperdense mural thickening or plaque displacement. Accessed subcutaneous port catheter is seen with reservoir at the midline chest and a right IJ approach central venous catheter terminating at the level of the superior cavoatrial junction. Notable distension of the azygos. Additional note made of diffuse pulmonary vascular redistribution and cephalization. Mediastinum/Nodes: No mediastinal  fluid or gas. Normal thyroid gland and thoracic inlet. No acute abnormality of the trachea or esophagus. No worrisome mediastinal or axillary adenopathy. Hilar nodal evaluation is limited in the absence of intravenous contrast media. Lungs/Pleura: Pulmonary vascular redistribution. Some mild mosaic attenuation is noted which can be related to hypoventilatory change, small airways disease or air trapping. No focal consolidative process. No pneumothorax or layering effusion. No concerning pulmonary nodules or masses. Upper Abdomen: No acute abnormalities present in the visualized portions of the upper abdomen. Musculoskeletal: No acute osseous abnormality or suspicious osseous lesion. Multilevel degenerative changes are present in the imaged portions of the spine. IMPRESSION: 1. Marked distension of the azygos and vascular redistribution, can be seen in the setting of volume overload or right heart failure. Alternatively could reflect some degree of vascular collateralization particularly there is venous obstruction elsewhere. 2. Enlarged main pulmonary arteries, can reflect underlying pulmonary artery hypertension. Pulmonary embolism cannot be excluded given that luminal evaluation is precluded in the absence of contrast media. 3. Nonspecific mosaic attenuation, differential could include hypoventilatory change, air trapping or small airways disease. 4.  Aortic Atherosclerosis (ICD10-I70.0). 5. Coronary artery atherosclerosis. Electronically Signed   By: Lovena Le M.D.   On: 11/23/2020 01:13   CT Angio Chest Pulmonary Embolism (PE) W or WO Contrast  Result Date: 11/23/2020 CLINICAL DATA:  50 year old female with chest pain, shortness of breath after Port-A-Cath flushing. EXAM: CT ANGIOGRAPHY CHEST WITH CONTRAST TECHNIQUE: Multidetector CT imaging of the chest was performed using the standard protocol during bolus administration of intravenous contrast. Multiplanar CT image reconstructions and MIPs were  obtained to evaluate the vascular anatomy. CONTRAST:  Forty-five mL Omnipaque 350, intravenous COMPARISON:  11/23/2020, 10/24/2020 FINDINGS: Cardiovascular: Satisfactory opacification of the pulmonary arteries to the segmental level. No evidence of pulmonary embolism. Mild dilation of the main portal vein measuring up to 30 mm. Prominent azygos vein measuring up to 35 mm at its apex. Due to contrast bolus timing, this is a limited evaluation for patency of this vein. Limited evaluation of the IVC appears within normal limits. Mild scattered atherosclerotic calcification of the thoracic aorta.  Normal heart size. No pericardial effusion. Mediastinum/Nodes: Again seen is mid sternal Port-A-Cath with right IJ approach, tip terminating in the right atrium. No enlarged mediastinal, hilar, or axillary lymph nodes. Thyroid gland, trachea, and esophagus demonstrate no significant findings. Lungs/Pleura: Low lung volumes. No focal consolidations. No suspicious pulmonary nodules. No pleural effusion or pneumothorax. Upper Abdomen: The visualized upper abdomen is within normal limits. Musculoskeletal: No chest wall abnormality. No acute or significant osseous findings. Review of the MIP images confirms the above findings. IMPRESSION: Vascular: 1. No evidence of pulmonary embolism. 2. Prominent azygos vein as could be seen with sequela of pulmonary hypertension or caval/truncal venous anomaly. Likely clinically insignificant. 3. Unchanged appearance of mid sternal placed right IJ port. Non-Vascular: No acute intrathoracic abnormality. Ruthann Cancer, MD [facility] Radiology Electronically Signed   By: Ruthann Cancer MD   On: 11/23/2020 10:37   DG Chest Port 1 View  Result Date: 11/22/2020 CLINICAL DATA:  50 year old female with fever and shortness of breath. EXAM: PORTABLE CHEST 1 VIEW COMPARISON:  Chest radiograph dated 05/04/2020. FINDINGS: Right-sided Port-A-Cath with tip  over the cavoatrial junction in similar position. No focal consolidation, pleural effusion, pneumothorax. There is cardiomegaly with central vascular prominence. Minimal left lung base density, likely atelectasis. No acute osseous pathology. IMPRESSION: Cardiomegaly with mild vascular congestion. No focal consolidation. Electronically Signed   By: Anner Crete M.D.   On: 11/22/2020 20:44   VAS [place] LOWER EXTREMITY VENOUS (DVT)  Result Date: 11/23/2020  Lower Venous DVT Study Patient Name:  EILIYAH STACKS  Date of Exam:   11/23/2020 Medical Rec #: [id number]                Accession #:    [id number] Date of Birth: 25-Sep-1970                 Patient Gender: F Patient Age:   50Y Exam Location:  [facility] Procedure:      VAS [place] LOWER EXTREMITY VENOUS (DVT) Referring Phys: [id number] [place] --------------------------------------------------------------------------------  Indications: Swelling, LT leg ulceration.  Anticoagulation: Eliquis. Limitations: Body habitus, poor ultrasound/tissue interface and bandages. Comparison Study: 06-23-2020 Lower extremity reflux bilateral showed reflux of                   the RT GSV, otherwise negative. Performing Technologist: Darlin Coco RDMS,RVT  Examination Guidelines: A complete evaluation includes B-mode imaging, spectral Doppler, color Doppler, and power Doppler as needed of all accessible portions of each vessel. Bilateral testing is considered an integral part of a complete examination. Limited examinations for reoccurring indications may be performed as noted. The reflux portion of the exam is performed with the patient in reverse Trendelenburg.  +---------+---------------+---------+-----------+----------+---------------+ RIGHT    CompressibilityPhasicitySpontaneityPropertiesThrombus Aging  +---------+---------------+---------+-----------+----------+---------------+ CFV      Full           Yes      Yes                                   +---------+---------------+---------+-----------+----------+---------------+ SFJ      Full                                                         +---------+---------------+---------+-----------+----------+---------------+  FV Prox  Full                                                         +---------+---------------+---------+-----------+----------+---------------+ FV Mid   Full                                                         +---------+---------------+---------+-----------+----------+---------------+ FV DistalFull                                                         +---------+---------------+---------+-----------+----------+---------------+ PFV      Full                                                         +---------+---------------+---------+-----------+----------+---------------+ POP      Full           Yes      Yes                                  +---------+---------------+---------+-----------+----------+---------------+ PTV                     Yes      Yes                  Patent by color +---------+---------------+---------+-----------+----------+---------------+ PERO                    Yes      Yes                  Patent by color +---------+---------------+---------+-----------+----------+---------------+ Gastroc  Full                                                         +---------+---------------+---------+-----------+----------+---------------+   +---------+---------------+---------+-----------+----------+-------------------+ LEFT     CompressibilityPhasicitySpontaneityPropertiesThrombus Aging      +---------+---------------+---------+-----------+----------+-------------------+ CFV      Full           Yes      Yes                                      +---------+---------------+---------+-----------+----------+-------------------+ SFJ      Full                                                              +---------+---------------+---------+-----------+----------+-------------------+  FV Prox  Full                                                             +---------+---------------+---------+-----------+----------+-------------------+ FV Mid   Full                                                             +---------+---------------+---------+-----------+----------+-------------------+ FV Distal               Yes      Yes                  Patent by color     +---------+---------------+---------+-----------+----------+-------------------+ PFV      Full                                                             +---------+---------------+---------+-----------+----------+-------------------+ POP      Full           Yes      Yes                                      +---------+---------------+---------+-----------+----------+-------------------+ PTV                                                   Not visualized                                                            -bandaging          +---------+---------------+---------+-----------+----------+-------------------+ PERO                                                  Not visualized                                                            -bandaging          +---------+---------------+---------+-----------+----------+-------------------+ Gastroc  Full                                                             +---------+---------------+---------+-----------+----------+-------------------+  Summary: RIGHT: - There is no evidence of deep vein thrombosis in the lower extremity. However, portions of this examination were limited- see technologist comments above.  - No cystic structure found in the popliteal fossa.  LEFT: - There is no evidence of deep vein thrombosis in the lower extremity. However, portions of this examination were limited- see technologist comments above.  - No cystic  structure found in the popliteal fossa.  *See table(s) above for measurements and observations. Electronically signed by Servando Snare MD on 11/23/2020 at 2:48:50 PM.    Final         Scheduled Meds:  amLODipine  2.5 mg Oral Daily   apixaban  5 mg Oral BID   atorvastatin  40 mg Oral Daily   carvedilol  25 mg Oral BID WC   Chlorhexidine Gluconate Cloth  6 each Topical Daily   DULoxetine  20 mg Oral Daily   furosemide  80 mg Intravenous Q12H   insulin aspart  0-20 Units Subcutaneous TID WC   insulin aspart  0-5 Units Subcutaneous QHS   irbesartan  75 mg Oral Daily   mometasone-formoterol  2 puff Inhalation BID   potassium chloride SA  20 mEq Oral Daily   spironolactone  25 mg Oral Daily   Continuous Infusions:  ceFEPime (MAXIPIME) IV 2 g (11/24/20 0546)   lactated ringers            Aline August, MD [facility] 11/24/2020, 7:29 AM

## 2020-11-24 NOTE — Consult Note (Addendum)
Cardiology Consultation:   Patient ID: [name] MRN: [id number]; DOB: 1971-03-26  Admit date: 11/22/2020 Date of Consult: 11/24/2020  PCP:  Ladell Pier, MD   [facility] Cardiologist:  Dr. Ellyn Hack { Patient Profile:   [name] is a 50 y.o. 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 Starla Link.   History of Present Illness:   [name] follows Dr. Ellyn Hack outpatient since 2021 after moving to [facility].  Chart history reports patient has CAD and diastolic heart failure.  Upon evaluation, appears patient never had heart catheterization and normal echocardiogram with normal diastolic function.  It was felt her chronic lower extremity edema with ulcer are due to venous insufficiency, lymphadenopathy and chronic DVT, where she was treated with Eliquis, diuretics, and wound care.  She was referred to vascular surgery.  She was last seen in office on 06/30/2020, reports following Dr. Lucky Cowboy from vascular surgery, was recommended leg elevation and compression stocking.  Valsartan was increased to 160 mg, amlodipine was discontinued, and carvedilol, Lasix, spironolactone were continued for hypertension.  She was not felt having any symptoms of CHF and leg edema was felt not cardiac in nature.   She is seen hematology for chronic anemia, has received IV iron infusion for several years the Port-A-Cath.  Colonoscopy and EGD at [facility] in August 2021 were benign without evidence of bleeding.  She did have history of bloody stools in the past, has not reoccurred since 2020 1 GI work-up aspirin discontinuation.  She was  last seen by hematology on 08/23/2020, anemia panel was stable and no further IV iron infusion was indicated.  She visited the ER 11/22/2020 for sudden onset of shortness of breath.  She was visiting her hematology office for a flash of her Port-A-Cath.  She DID not receive iron infusion.  Upon arrival to home, she developed mid-sternum chest pain, cough, shortness of breath, symptom progressively gotten worse and therefore she called EMS.  She was found hypoxic with pulse ox 58% on room air, placed on nonrebreather and subsequently brought to the hospital.  She was found to have a temperature of 103 upon arrival.  She had reported some chills and rigors.    Diagnostic work-up at admission showed glucose of 155, otherwise unremarkable CMP.  CBC revealed hemoglobin of 10.2 and thrombocytopenia 117k.  Flu and COVID-negative.  Blood culture x2 sent.  ABG unremarkable.  Lactic acid was 2.1.  High sensitive troponin negative. Chest x-ray revealed cardiomegaly with mild vascular congestion.  Urinalysis negative.  She was concerned for possible pneumonia, admitted to hospital medicine, started on cefepime, and given treatments for asthma exacerbation.   CT of chest without contrast completed 11/15/2020 revealed marked distention of azygos and vascular redistribution concerning for volume overload or right heart failure, enlarged main pulmonary arteries could reflect underlying pulmonary artery hypertension, pulmonary embolism cannot be excluded, nonspecific mosaic attenuation seen in hypoventilatory change or air trapping or small airway disease, aortic atherosclerosis, coronary atherosclerosis.  CTA chest with contrast subsequently ruled out PE.  Venous Doppler 7/28 showed no evidence of DVT of bilateral lower extremity.  She was concerned for CHF, started on IV Lasix 80 mg twice daily, and  cardiology is asked to see the patient today for CHF and chest pain.  During encounter, patient appears a poor historian. She  states she has hx of CHF diagnosed at a different state, was taking diuretic for this reason. She states she has not been feeling good since she was born, endorses chronic SOB with exertion such as walking, orthopnea. She has never been diagnosed with asthma, uses a inhaler at home for SOB. She states her leg are always swollen, she has a chronic wound of left lower extremity that is healing well.  She described her chest pain is located at midsternal area, started after receiving flush of her Port-A-Cath, pain is persistent and never resolved.  Pain is non-radiating, not worsened by exertion nor relieved by resting.  Chest palpation triggers the pain.  She denied any history of stress test or heart catheterization.  She expressed concern that she should not be released home because of her chest pain but her doctor is trying to discharge her.  She denied tobacco use, illicit drug use, alcohol use.     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   Morbid obesity due to excess calories ([place])    Osteoarthritis of left knee    Prediabetes    Small bowel obstruction ([place])    as a child   Speech impediment    Stutter / stammer    Past Surgical History:  Procedure Laterality Date   ABDOMINAL WALL  DEFECT REPAIR  1970   IR CV LINE INJECTION  10/24/2020   IVC FILTER INSERTION  2017   [place]  2014     Home Medications:  Prior to Admission medications   Medication Sig Start Date End Date Taking? Authorizing Provider  acetaminophen (TYLENOL) 500 MG tablet Take 1 tablet (500 mg total) by mouth every 6 (six) hours as needed. 04/19/20  Yes Ladell Pier, MD  albuterol (PROAIR HFA) 108 (90 Base) MCG/ACT inhaler INHALE TWO PUFFS BY MOUTH EVERY 6 HOURS AS NEEDED FOR WHEEZING OR SHORTNESS OF BREATH Patient taking differently: Inhale 2 puffs into the lungs every 6 (six) hours as needed for wheezing or shortness of breath. 10/05/20  Yes Ladell Pier, MD  amLODipine (NORVASC) 5 MG tablet Take 0.5 tablets (2.5 mg total) by mouth daily. 10/26/20  Yes Ladell Pier, MD  apixaban (ELIQUIS) 5 MG TABS tablet Take 1 tablet (5 mg total) by mouth 2 (two) times daily. 10/26/20  Yes Ladell Pier, MD  atorvastatin (LIPITOR) 40 MG tablet Take 1 tablet (40 mg total) by mouth daily. 10/26/20  Yes Ladell Pier, MD  Carboxymethylcellulose Sodium (EYE DROPS OP) Place 1 drop  into both eyes daily as needed (dry eyes).   Yes [provider]  carvedilol (COREG) 25 MG tablet TAKE ONE TABLET BY MOUTH TWICE A DAY WITH A MEAL Patient taking differently: Take 25 mg by mouth in the morning and at bedtime. 10/26/20  Yes Ladell Pier, MD  cholecalciferol (VITAMIN D3) 25 MCG (1000 UNIT) tablet Take 1 tablet (1,000 Units total) by mouth daily. 04/07/20  Yes Ladell Pier, MD  DULoxetine (CYMBALTA) 20 MG capsule Take 20 mg by mouth daily.   Yes [provider]  furosemide (LASIX) 80 MG tablet Take 1 tablet (80 mg total) by mouth daily. 10/26/20  Yes Ladell Pier, MD  gabapentin (NEURONTIN) 100 MG capsule Take 100 mg by mouth 3 (three) times daily as needed (pain).   Yes [provider]  hydrocortisone cream 1 %  Apply 1 application topically daily as needed for itching.   Yes [provider]  KLOR-CON M20 20 MEQ tablet TAKE ONE TABLET BY MOUTH DAILY Patient taking differently: Take 20 mEq by mouth once. 11/08/20  Yes Ladell Pier, MD  methocarbamol (ROBAXIN) 500 MG tablet TAKE ONE TABLET BY MOUTH DAILY AS NEEDED FOR MUSCLE SPASMS Patient taking differently: Take 500 mg by mouth daily as needed for muscle spasms. 10/05/20  Yes Ladell Pier, MD  omeprazole (PRILOSEC) 20 MG capsule Take 20 mg by mouth daily as needed (acid reflux). 12/15/19  Yes [provider]  PARoxetine (PAXIL) 10 MG tablet TAKE ONE TABLET BY MOUTH DAILY Patient taking differently: Take 10 mg by mouth daily. 11/08/20  Yes Ladell Pier, MD  predniSONE & diphenhydrAMINE (CONTRAST ALLERGY PREMED PACK) 3 x 50 MG & 1 x 50 MG KIT Take 50 mg prednisone at 2:30 am, 8:30 am, and 2:30 pm; Take Benadryl 50 mg at 2:30 PM 10/20/20  Yes Han, Aimee H, PA-C  spironolactone (ALDACTONE) 25 MG tablet Take 1 tablet (25 mg total) by mouth daily. 10/26/20  Yes Ladell Pier, MD  valsartan (DIOVAN) 80 MG tablet Take 1 tablet (80 mg total) by mouth daily. Patient taking differently: Take 80 mg by mouth every evening. 06/30/20  Yes Leonie Man, MD  vitamin B-12 (CYANOCOBALAMIN) 1000 MCG tablet Take 1,000 mcg by mouth daily.   Yes [provider]    Inpatient Medications: Scheduled Meds:  amLODipine  2.5 mg Oral Daily   apixaban  5 mg Oral BID   atorvastatin  40 mg Oral Daily   carvedilol  25 mg Oral BID WC   Chlorhexidine Gluconate Cloth  6 each Topical Daily   DULoxetine  20 mg Oral Daily   furosemide  80 mg Intravenous Q12H   insulin aspart  0-20 Units Subcutaneous TID WC   insulin aspart  0-5 Units Subcutaneous QHS   irbesartan  75 mg Oral Daily   mometasone-formoterol  2 puff Inhalation BID   potassium chloride SA  20 mEq Oral Daily   spironolactone  25 mg Oral Daily   Continuous Infusions:  ceFEPime  (MAXIPIME) IV 2 g (11/24/20 1219)   PRN Meds: acetaminophen **OR** acetaminophen, albuterol, gabapentin, morphine injection, nitroGLYCERIN, sodium chloride flush  Allergies:    Allergies  Allergen Reactions   Ace Inhibitors Rash and Other (See Comments)    Make pt bleed   Aspirin Other (See Comments)    Per patient paperwork: blood clot?  Likely because of chronic DOAC   Hydromorphone Hives and Itching   Vancomycin Itching and Rash   Contrast  Media [Iodinated Diagnostic Agents] Hives   Dilaudid [Hydromorphone Hcl] Hives    Social History:   Social History   Socioeconomic History   Marital status: Single    Spouse name: Not on file   Number of children: 0   Years of education: Not on file   Highest education level: 12th grade  Occupational History   Occupation: unemployed on disablity  Tobacco Use   Smoking status: Former   Smokeless tobacco: Never  Scientific laboratory technician Use: Never used  Substance and Sexual Activity   Alcohol use: Not Currently   Drug use: Not Currently   Sexual activity: Not Currently  Other Topics Concern   Not on file  Social History Narrative   Not on file   Social Determinants of Health   Financial Resource Strain: Not on file  Food Insecurity: No Food Insecurity   Worried About Charity fundraiser in the Last Year: Never true   [place] in the Last Year: Never true  Transportation Needs: No Transportation Needs   Lack of Transportation (Medical): No   Lack of Transportation (Non-Medical): No  Physical Activity: Not on file  Stress: Not on file  Social Connections: Not on file  Intimate Partner Violence: Not on file    Family History:    Family History  Problem Relation Age of Onset   Diabetes Mellitus II Mother    COPD Father    Diabetes Father    Diabetes Mellitus II Maternal Grandmother    Breast cancer Paternal Grandfather      ROS:  Constitutional: Denied fever, chills, malaise, night sweats Eyes: Denied vision  change or loss Ears/Nose/Mouth/Throat: Denied ear ache, sore throat, coughing, sinus pain Cardiovascular: see HPI  Respiratory: see HPI  Gastrointestinal: Denied nausea, vomiting, abdominal pain, diarrhea Genital/Urinary: Denied dysuria, hematuria, urinary frequency/urgency Musculoskeletal: Denied muscle ache, joint pain, weakness Skin: Chronic wound of left lower extremity Neuro: Denied headache, dizziness, syncope Psych: Denied history of depression/anxiety  Endocrine: history of diabetes   Physical Exam/Data:   Vitals:   11/23/20 1915 11/24/20 0348 11/24/20 0741 11/24/20 0748  BP: 110/86 124/77    Pulse: 84     Resp: 15     Temp: 97.7 F (36.5 C) 97.7 F (36.5 C)    TempSrc: Oral Oral    SpO2: 98%   95%  Weight:  (!) 161.6 kg (!) 160.6 kg   Height:        Intake/Output Summary (Last 24 hours) at 11/24/2020 1526 Last data filed at 11/24/2020 1500 Gross per 24 hour  Intake 200 ml  Output 3550 ml  Net -3350 ml   Last 3 Weights 11/24/2020 11/24/2020 11/23/2020  Weight (lbs) 354 lb 0.9 oz 356 lb 4.2 oz 320 lb  Weight (kg) 160.6 kg 161.6 kg 145.151 kg     Body mass index is 57.15 kg/m.   Vitals:  Vitals:   11/24/20 0348 11/24/20 0748  BP: 124/77   Pulse:    Resp:    Temp: 97.7 F (36.5 C)   SpO2:  95%   General Appearance: In no apparent distress, laying in bed, morbidly obese HEENT: Normocephalic, atraumatic.  Neck: Supple, trachea midline, short and thick neck, difficult assess JVD Cardiovascular: Regular rate and rhythm, normal S1-S2,  no murmur/rub/gallop Respiratory: Resting breathing unlabored, lungs sounds clear to auscultation bilaterally, no use of accessory muscles. On room air.  No wheezes, rales or rhonchi.   Gastrointestinal: Bowel sounds positive, abdomen soft, non-tender,  non-distended. No mass or organomegaly.  Extremities: Chronic bilateral lower extremity lymphedema, left lower extremity in compression wrap/not interrupted by exam Genitourinary:  Suction Foley with clear yellow urine Musculoskeletal: Normal muscle bulk and tone, muscle strength 5/5 throughout, no limited range of motion, no swollen or erythematous joints Skin: Intact, warm, dry. No rashes or petechiae noted in exposed areas.  Neurologic: Alert, oriented to person, place and time.  Stuttered speech, no apparent cognitive deficit, poor insight, no gross focal neuro deficit Psychiatric: Normal affect. Mood is appropriate.    EKG:  The EKG was personally reviewed and demonstrates: Tachycardia with ventricular 133 bpm, nonspecific ST-T change  Telemetry:  Telemetry was personally reviewed and demonstrates: Sinus rhythm with occasional PVCs  Relevant CV Studies:  No available cardiac work-up at this time  Laboratory Data:  High Sensitivity Troponin:   Recent Labs  Lab 11/22/20 2021 11/22/20 2217 11/23/20 1550 11/23/20 1950  TROPONINIHS 8 11 6 5      Chemistry Recent Labs  Lab 11/22/20 2021 11/22/20 2051 11/23/20 0537  NA 135 140 136  K 3.5 3.4* 3.8  CL 102  --  103  CO2 26  --  26  GLUCOSE 155*  --  210*  BUN 10  --  13  CREATININE 0.84  --  0.91  CALCIUM 8.9  --  9.2  GFRNONAA >60  --  >60  ANIONGAP 7  --  7    Recent Labs  Lab 11/22/20 2021  PROT 7.1  ALBUMIN 3.3*  AST 16  ALT 11  ALKPHOS 69  BILITOT 0.9   Hematology Recent Labs  Lab 11/22/20 2021 11/22/20 2051 11/23/20 0537  WBC 6.4  --  9.5  RBC 3.72*  --  3.87  HGB 10.2* 10.5* 10.8*  HCT 35.0* 31.0* 36.2  MCV 94.1  --  93.5  MCH 27.4  --  27.9  MCHC 29.1*  --  29.8*  RDW 14.2  --  14.5  PLT 117*  --  129*   BNP Recent Labs  Lab 11/22/20 2338  BNP 46.6    DDimer  Recent Labs  Lab 11/22/20 2302  DDIMER 1.07*     Radiology/Studies:  CT CHEST WO CONTRAST  Result Date: 11/23/2020 CLINICAL DATA:  Sudden onset chest pain after a port flush, hypoxia. EXAM: CT CHEST WITHOUT CONTRAST TECHNIQUE: Multidetector CT imaging of the chest was performed following the standard  protocol without IV contrast. COMPARISON:  Radiograph 11/22/2020. FINDINGS: Cardiovascular: Limited evaluation the absence of contrast media. Cardiac size is top normal. No pericardial effusion. Coronary artery atherosclerosis. Central pulmonary arteries are borderline enlarged. Atherosclerotic plaque within the normal caliber aorta. No hyperdense mural thickening or plaque displacement. Accessed subcutaneous port catheter is seen with reservoir at the midline chest and a right IJ approach central venous catheter terminating at the level of the superior cavoatrial junction. Notable distension of the azygos. Additional note made of diffuse pulmonary vascular redistribution and cephalization. Mediastinum/Nodes: No mediastinal fluid or gas. Normal thyroid gland and thoracic inlet. No acute abnormality of the trachea or esophagus. No worrisome mediastinal or axillary adenopathy. Hilar nodal evaluation is limited in the absence of intravenous contrast media. Lungs/Pleura: Pulmonary vascular redistribution. Some mild mosaic attenuation is noted which can be related to hypoventilatory change, small airways disease or air trapping. No focal consolidative process. No pneumothorax or layering effusion. No concerning pulmonary nodules or masses. Upper Abdomen: No acute abnormalities present in the visualized portions of the upper abdomen. Musculoskeletal: No acute osseous abnormality  or suspicious osseous lesion. Multilevel degenerative changes are present in the imaged portions of the spine. IMPRESSION: 1. Marked distension of the azygos and vascular redistribution, can be seen in the setting of volume overload or right heart failure. Alternatively could reflect some degree of vascular collateralization particularly there is venous obstruction elsewhere. 2. Enlarged main pulmonary arteries, can reflect underlying pulmonary artery hypertension. Pulmonary embolism cannot be excluded given that luminal evaluation is precluded in  the absence of contrast media. 3. Nonspecific mosaic attenuation, differential could include hypoventilatory change, air trapping or small airways disease. 4.  Aortic Atherosclerosis (ICD10-I70.0). 5. Coronary artery atherosclerosis. Electronically Signed   By: Lovena Le M.D.   On: 11/23/2020 01:13   CT Angio Chest Pulmonary Embolism (PE) W or WO Contrast  Result Date: 11/23/2020 CLINICAL DATA:  50 year old female with chest pain, shortness of breath after Port-A-Cath flushing. EXAM: CT ANGIOGRAPHY CHEST WITH CONTRAST TECHNIQUE: Multidetector CT imaging of the chest was performed using the standard protocol during bolus administration of intravenous contrast. Multiplanar CT image reconstructions and MIPs were obtained to evaluate the vascular anatomy. CONTRAST:  Forty-five mL Omnipaque 350, intravenous COMPARISON:  11/23/2020, 10/24/2020 FINDINGS: Cardiovascular: Satisfactory opacification of the pulmonary arteries to the segmental level. No evidence of pulmonary embolism. Mild dilation of the main portal vein measuring up to 30 mm. Prominent azygos vein measuring up to 35 mm at its apex. Due to contrast bolus timing, this is a limited evaluation for patency of this vein. Limited evaluation of the IVC appears within normal limits. Mild scattered atherosclerotic calcification of the thoracic aorta. Normal heart size. No pericardial effusion. Mediastinum/Nodes: Again seen is mid sternal Port-A-Cath with right IJ approach, tip terminating in the right atrium. No enlarged mediastinal, hilar, or axillary lymph nodes. Thyroid gland, trachea, and esophagus demonstrate no significant findings. Lungs/Pleura: Low lung volumes. No focal consolidations. No suspicious pulmonary nodules. No pleural effusion or pneumothorax. Upper Abdomen: The visualized upper abdomen is within normal limits. Musculoskeletal: No chest wall abnormality. No acute or significant osseous findings. Review of the MIP images confirms the above  findings. IMPRESSION: Vascular: 1. No evidence of pulmonary embolism. 2. Prominent azygos vein as could be seen with sequela of pulmonary hypertension or caval/truncal venous anomaly. Likely clinically insignificant. 3. Unchanged appearance of mid sternal placed right IJ port. Non-Vascular: No acute intrathoracic abnormality. Ruthann Cancer, MD [facility] Radiology Electronically Signed   By: Ruthann Cancer MD   On: 11/23/2020 10:37   DG Chest Port 1 View  Result Date: 11/22/2020 CLINICAL DATA:  50 year old female with fever and shortness of breath. EXAM: PORTABLE CHEST 1 VIEW COMPARISON:  Chest radiograph dated 05/04/2020. FINDINGS: Right-sided Port-A-Cath with tip over the cavoatrial junction in similar position. No focal consolidation, pleural effusion, pneumothorax. There is cardiomegaly with central vascular prominence. Minimal left lung base density, likely atelectasis. No acute osseous pathology. IMPRESSION: Cardiomegaly with mild vascular congestion. No focal consolidation. Electronically Signed   By: Anner Crete M.D.   On: 11/22/2020 20:44   VAS [place] LOWER EXTREMITY VENOUS (DVT)  Result Date: 11/23/2020  Lower Venous DVT Study Patient Name:  [name]  Date of Exam:   11/23/2020 Medical Rec #: [id number]                Accession #:    [id number] Date of Birth: 1971-04-13                 Patient Gender: F Patient Age:   63Y  Exam Location:  [facility] Procedure:      VAS [place] LOWER EXTREMITY VENOUS (DVT) Referring Phys: [id number] [place] --------------------------------------------------------------------------------  Indications: Swelling, LT leg ulceration.  Anticoagulation: Eliquis. Limitations: Body habitus, poor ultrasound/tissue interface and bandages. Comparison Study: 06-23-2020 Lower extremity reflux bilateral showed reflux of                   the RT GSV, otherwise negative. Performing Technologist: Darlin Coco  RDMS,RVT  Examination Guidelines: A complete evaluation includes B-mode imaging, spectral Doppler, color Doppler, and power Doppler as needed of all accessible portions of each vessel. Bilateral testing is considered an integral part of a complete examination. Limited examinations for reoccurring indications may be performed as noted. The reflux portion of the exam is performed with the patient in reverse Trendelenburg.  +---------+---------------+---------+-----------+----------+---------------+ RIGHT    CompressibilityPhasicitySpontaneityPropertiesThrombus Aging  +---------+---------------+---------+-----------+----------+---------------+ CFV      Full           Yes      Yes                                  +---------+---------------+---------+-----------+----------+---------------+ SFJ      Full                                                         +---------+---------------+---------+-----------+----------+---------------+ FV Prox  Full                                                         +---------+---------------+---------+-----------+----------+---------------+ FV Mid   Full                                                         +---------+---------------+---------+-----------+----------+---------------+ FV DistalFull                                                         +---------+---------------+---------+-----------+----------+---------------+ PFV      Full                                                         +---------+---------------+---------+-----------+----------+---------------+ POP      Full           Yes      Yes                                  +---------+---------------+---------+-----------+----------+---------------+ PTV                     Yes  Yes                  Patent by color +---------+---------------+---------+-----------+----------+---------------+ PERO                    Yes      Yes                   Patent by color +---------+---------------+---------+-----------+----------+---------------+ Gastroc  Full                                                         +---------+---------------+---------+-----------+----------+---------------+   +---------+---------------+---------+-----------+----------+-------------------+ LEFT     CompressibilityPhasicitySpontaneityPropertiesThrombus Aging      +---------+---------------+---------+-----------+----------+-------------------+ CFV      Full           Yes      Yes                                      +---------+---------------+---------+-----------+----------+-------------------+ SFJ      Full                                                             +---------+---------------+---------+-----------+----------+-------------------+ FV Prox  Full                                                             +---------+---------------+---------+-----------+----------+-------------------+ FV Mid   Full                                                             +---------+---------------+---------+-----------+----------+-------------------+ FV Distal               Yes      Yes                  Patent by color     +---------+---------------+---------+-----------+----------+-------------------+ PFV      Full                                                             +---------+---------------+---------+-----------+----------+-------------------+ POP      Full           Yes      Yes                                      +---------+---------------+---------+-----------+----------+-------------------+ PTV  Not visualized                                                            -bandaging          +---------+---------------+---------+-----------+----------+-------------------+ PERO                                                  Not visualized                                                             -bandaging          +---------+---------------+---------+-----------+----------+-------------------+ Gastroc  Full                                                             +---------+---------------+---------+-----------+----------+-------------------+     Summary: RIGHT: - There is no evidence of deep vein thrombosis in the lower extremity. However, portions of this examination were limited- see technologist comments above.  - No cystic structure found in the popliteal fossa.  LEFT: - There is no evidence of deep vein thrombosis in the lower extremity. However, portions of this examination were limited- see technologist comments above.  - No cystic structure found in the popliteal fossa.  *See table(s) above for measurements and observations. Electronically signed by Servando Snare MD on 11/23/2020 at 2:48:50 PM.    Final      Assessment and Plan:   Acute on chronic heart failure with unknown EF -History of diastolic heart failure reported in the past when patient was in a different state, upon evaluation by Dr. Ellyn Hack since 2021, reportedly echo was normal, no clinical symptoms suggest CHF, did not feel patient has diastolic heart failure -Patient reports she was diagnosed with CHF at a different state, endorse chronic shortness of breath with exertion, orthopnea, leg edema and takes Lasix and spironolactone for this purpose - Presented with shortness of breath and hypoxia after flash of Port-A-Cath cath - BNP 46.6 but in the setting of morbid obesity - CT chest showed marked distension of the azygos and vascular redistribution, can be seen in the setting of volume overload or right heart failure.  Enlarged main pulmonary artery can reflect pulmonary artery hypertension - CTA negative for PE  - unclear if presentation is due to heart failure  - will obtain Echo for further evaluation  - OK continue IV lasix at current dose,  monitor intake and output, daily weight  Chest pain -History of MI and CAD reported in the past when patient was in a different state, upon evaluation by Dr. Ellyn Hack since 2021, patient states she has never had a cardiac catheterization -Patient self reports that she has had a heart attack in 2007 due to massive bleeding from a wound, yet denied a history  of stress test or cardiac catheterization or stent placement - Hs trop negative x4 - EKG with non-specific ST-T changes  - Non- coronary CT revealed  no coronary artery atherosclerosis - Chest pain is reproducible clinically, atypical picture -ACS has been ruled out, suspecting discomfort from CHF,  echo as above for further evaluation of possible CHF  Acute hypoxic respiratory failure -Pulse ox 58% on room air reported per EMS -Improved with nasal cannula oxygen, currently weaned to room air -Work-up for CHF as above -No infiltrate or consolidation per CT imaging to suggest infection  -will check procalcitonin today  Hypertension -Home meds include amlodipine 2.5 mg, carvedilol 25 mg twice daily, Lasix 80 mg daily, spironolactone 25 mg daily, valsartan 80 mg daily -Blood pressure is fairly controlled at this time  Fever of unknown origin -Had single fever of 103 upon arrival was reported per hospital medicine note -Blood culture negative to date, chest imaging without infiltrate/consolidation, UA negative for infection, respiratory panel negative -Source is unclear at this time -On empiric IV cefepime -Consider remove Port-A-Cath if there is no absolute indication to avoid risk of infection  Asthma exacerbation -Patient states she has never been diagnosed with asthma in the past, currently receiving exacerbation regimen treatment per IM -Consider outpatient PFT, she does not appear in acute exacerbation clinically  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 Lymphedema Venous insufficiency -Follows wound care outpatient  regularly, Doppler negative for DVT this admission, follow-up with vascular surgery as instructed - On diuretic lasix and spironolactone at home for this reason  Type 2 diabetes -A1c 6.5, patient states she is insulin sliding scale at home however has not required any insulin for 5 years, defer management to IM  Chronic anemia -Has been on iron infusion historically and follows hematology  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 Agree with APP above with the following comments and changes to the above: Briefly 50 yo F with hx of Significant Iron Deficiency Anemia with demand ischemia related NSTEMI  over 4 years go at [facility] in [place]; prior DVT s/p IVC Filter, Morbid Obesity with T2DM, who presented with concern of FUO and Hypoxic respiratory failure.   Patient notes that she is no longer having CP; but has significant breathing issues.  Unclear if she feels better with IV lasix.  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  Tele is SR Would recommend  - Continue IV diuresis - echocardiogram is ordered and pending  - continue patient's spironolatone 25 mg Po Daily - Patient may benefit from Reid; also has concomitant hyperglycemia - we will continue to follow  Rudean Haskell, MD Cardiologist [facility]  [address] [place], [place] [address] [phone]  3:41 PM

## 2020-11-25 ENCOUNTER — Inpatient Hospital Stay (HOSPITAL_COMMUNITY): Payer: Medicaid Other

## 2020-11-25 DIAGNOSIS — R509 Fever, unspecified: Secondary | ICD-10-CM | POA: Diagnosis not present

## 2020-11-25 DIAGNOSIS — Z86718 Personal history of other venous thrombosis and embolism: Secondary | ICD-10-CM

## 2020-11-25 DIAGNOSIS — R079 Chest pain, unspecified: Secondary | ICD-10-CM

## 2020-11-25 DIAGNOSIS — I5033 Acute on chronic diastolic (congestive) heart failure: Secondary | ICD-10-CM | POA: Diagnosis not present

## 2020-11-25 DIAGNOSIS — J4541 Moderate persistent asthma with (acute) exacerbation: Secondary | ICD-10-CM | POA: Diagnosis not present

## 2020-11-25 DIAGNOSIS — J9601 Acute respiratory failure with hypoxia: Secondary | ICD-10-CM | POA: Diagnosis not present

## 2020-11-25 DIAGNOSIS — I700 Atherosclerosis of aorta: Secondary | ICD-10-CM | POA: Diagnosis not present

## 2020-11-25 DIAGNOSIS — D500 Iron deficiency anemia secondary to blood loss (chronic): Secondary | ICD-10-CM | POA: Diagnosis not present

## 2020-11-25 HISTORY — PX: TRANSTHORACIC ECHOCARDIOGRAM: SHX275

## 2020-11-25 LAB — BASIC METABOLIC PANEL
Anion gap: 8 (ref 5–15)
BUN: 25 mg/dL — ABNORMAL HIGH (ref 6–20)
CO2: 32 mmol/L (ref 22–32)
Calcium: 9.2 mg/dL (ref 8.9–10.3)
Chloride: 94 mmol/L — ABNORMAL LOW (ref 98–111)
Creatinine, Ser: 0.84 mg/dL (ref 0.44–1.00)
GFR, Estimated: >60 mL/min (ref >60)
Glucose, Bld: 205 mg/dL — ABNORMAL HIGH (ref 70–99)
Potassium: 3.8 mmol/L (ref 3.5–5.1)
Sodium: 134 mmol/L — ABNORMAL LOW (ref 135–145)

## 2020-11-25 LAB — CBC WITH DIFFERENTIAL/PLATELET
Abs Immature Granulocytes: 0.03 10*3/uL (ref 0.00–0.07)
Basophils Absolute: 0 10*3/uL (ref 0.0–0.1)
Basophils Relative: 0 %
Eosinophils Absolute: 0 10*3/uL (ref 0.0–0.5)
Eosinophils Relative: 0 %
HCT: 34.1 % — ABNORMAL LOW (ref 36.0–46.0)
Hemoglobin: 10.4 g/dL — ABNORMAL LOW (ref 12.0–15.0)
Immature Granulocytes: 0 %
Lymphocytes Relative: 4 %
Lymphs Abs: 0.4 10*3/uL — ABNORMAL LOW (ref 0.7–4.0)
MCH: 27.7 pg (ref 26.0–34.0)
MCHC: 30.5 g/dL (ref 30.0–36.0)
MCV: 90.7 fL (ref 80.0–100.0)
Monocytes Absolute: 0.6 10*3/uL (ref 0.1–1.0)
Monocytes Relative: 5 %
Neutro Abs: 10.1 10*3/uL — ABNORMAL HIGH (ref 1.7–7.7)
Neutrophils Relative %: 91 %
Platelets: 160 10*3/uL (ref 150–400)
RBC: 3.76 MIL/uL — ABNORMAL LOW (ref 3.87–5.11)
RDW: 14.4 % (ref 11.5–15.5)
WBC: 11.1 10*3/uL — ABNORMAL HIGH (ref 4.0–10.5)
nRBC: 0 % (ref 0.0–0.2)

## 2020-11-25 LAB — GLUCOSE, CAPILLARY
Glucose-Capillary: 183 mg/dL — ABNORMAL HIGH (ref 70–99)
Glucose-Capillary: 227 mg/dL — ABNORMAL HIGH (ref 70–99)
Glucose-Capillary: 302 mg/dL — ABNORMAL HIGH (ref 70–99)
Glucose-Capillary: 267 mg/dL — ABNORMAL HIGH (ref 70–99)

## 2020-11-25 LAB — MAGNESIUM: Magnesium: 2 mg/dL (ref 1.7–2.4)

## 2020-11-25 LAB — ECHOCARDIOGRAM COMPLETE
Area-P 1/2: 3.72 cm2
Height: 66 in
S' Lateral: 3.3 cm
Weight: 5664.94 oz

## 2020-11-25 MED ORDER — METHYLPREDNISOLONE SODIUM SUCC 40 MG IJ SOLR
40.0000 mg | Freq: Two times a day (BID) | INTRAMUSCULAR | Status: DC
  Administered 2020-11-25 – 2020-11-26 (×3): 40 mg via INTRAVENOUS
  Filled 2020-11-25 (×3): qty 1

## 2020-11-25 NOTE — Progress Notes (Signed)
Patient ID: [name], female   DOB: [birth year], 50 y.o.   MRN: [id number]  PROGRESS NOTE    [name]  [id number] DOB: [birth year] DOA: 11/22/2020 PCP: Ladell Pier, MD   Brief Narrative:  50 y.o. female with medical history significant for HFpEF, DVT on anticoagulation with Eliquis, HTN, asthma, iron deficiency anemia who presented with worsening shortness of breath.  EMS had to put her on nonrebreather because her saturations were 58% on room air.  On presentation, she was febrile to 103F.  Chest x-ray was negative for infiltrates; UA was negative for UTI.  WBCs were normal with normal renal function.  She was given IV antibiotics empirically.  Assessment & Plan:   Fever -Questionable cause.  Presented with temperature of 103 F.  Chest x-ray negative for infiltrates; UA negative for UTI.  COVID-19 and influenza tests were negative.  CT chest without contrast showed findings of possible right heart failure/volume overload; no evidence of pulmonary embolism -Currently on empiric cefepime.  Off IV fluids.  Cultures negative so far -Afebrile for the last 48 hours. -DC cefepime  Asthma exacerbation -Slightly improving.  Decrease Solu-Medrol to 40 mg IV every 12 hours.  Continue Dulera and nebs.  Acute hypoxic respiratory failure -Possibly from asthma exacerbation and CHF exacerbation.  Presented on a nonrebreather.  Respiratory status improving.  Currently on room air.  Acute on chronic diastolic heart failure Chest pain -Patient takes Lasix 80 mg p.o. daily at home.  CT chest showed findings of possible volume overload -Strict input and output.  Daily weights.  Fluid restriction.  Continue Coreg, irbesartan and spironolactone.  Outpatient follow-up with cardiology -Continue IV Lasix 80 mg twice a day and switch to oral Lasix once okay with cardiology. -Patient continues to have intermittent chest pain; troponins have been negative so far.  Cardiology  following and 2D echo is pending.  Bilateral lower extremity edema -Patient has chronic bilateral lower extremity edema and has Unna boots.  Lower extremity duplex ultrasound was negative for DVT.  History of DVT on Eliquis -Continue Eliquis  Hypokalemia -Improved  Thrombocytopenia -Questionable cause.  No signs of bleeding.  Resolved  Leukocytosis -Monitor  Iron deficiency anemia/anemia of chronic disease -Receives iron infusions at cancer center.  Outpatient follow-up.  Hemoglobin stable.  Diabetes mellitus type 2 with hyperglycemia -A1c 6.5.  Continue CBGs with SSI.  Carb modified diet.   DVT prophylaxis: Eliquis Code Status: Full Family Communication: None at bedside Disposition Plan: Status is: Inpatient  Remains inpatient appropriate because:Inpatient level of care appropriate due to severity of illness  Dispo: The patient is from: Home              Anticipated d/c is to: Home once cleared by cardiology              Patient currently is not medically stable to d/c.   Difficult to place patient No  Consultants: Cardiology Procedures: None  Antimicrobials:  Anti-infectives (From admission, onward)    Start     Dose/Rate Route Frequency Ordered Stop   11/23/20 0600  ceFEPIme (MAXIPIME) 2 g in sodium chloride 0.9 % 100 mL IVPB        2 g 200 mL/hr over 30 Minutes Intravenous Every 8 hours 11/22/20 2313     11/23/20 0015  ceFEPIme (MAXIPIME) 1 g in sodium chloride 0.9 % 100 mL IVPB  Status:  Discontinued        1 g 200 mL/hr over 30 Minutes Intravenous Every  8 hours 11/23/20 0001 11/23/20 0011   11/22/20 2045  ceFEPIme (MAXIPIME) 2 g in sodium chloride 0.9 % 100 mL IVPB        2 g 200 mL/hr over 30 Minutes Intravenous  Once 11/22/20 2035 11/22/20 2144   11/22/20 2045  metroNIDAZOLE (FLAGYL) IVPB 500 mg        500 mg 100 mL/hr over 60 Minutes Intravenous  Once 11/22/20 2035 11/22/20 2234         Subjective: Patient seen and examined at bedside.  No  overnight fever, vomiting reported.  Still complains of intermittent chest pain.  Breathing is improving but still short of breath with exertion. Objective: Vitals:   11/24/20 0748 11/24/20 1602 11/24/20 2146 11/25/20 0325  BP:  132/81 129/82   Pulse:  79    Resp:  14    Temp:  97.9 F (36.6 C) 97.6 F (36.4 C) 97.6 F (36.4 C)  TempSrc:  Oral Oral Oral  SpO2: 95% 100%    Weight:      Height:        Intake/Output Summary (Last 24 hours) at 11/25/2020 0847 Last data filed at 11/25/2020 0523 Gross per 24 hour  Intake --  Output 1602 ml  Net -1602 ml    Filed Weights   11/23/20 0349 11/24/20 0348 11/24/20 0741  Weight: (!) 145.2 kg (!) 161.6 kg (!) 160.6 kg    Examination:  General exam: Currently on room air.  No acute distress.   Respiratory system: Bilateral decreased breath sounds at bases with some scattered crackles  cardiovascular system: S1-S2 heard, rate controlled  gastrointestinal system: Abdomen is morbidly obese, distended slightly, soft and nontender.  Normal bowel sounds heard extremities: Lower extremity edema present bilaterally; no clubbing; lower extremities wrapped bilaterally Central nervous system: Alert and oriented.  No focal neurological deficits.  Moving extremities skin: No obvious ecchymosis/lesions psychiatry: Looks anxious intermittently.  Mostly flat affect.  Data Reviewed: I have personally reviewed following labs and imaging studies  CBC: Recent Labs  Lab 11/22/20 2021 11/22/20 2051 11/23/20 0537 11/25/20 0425  WBC 6.4  --  9.5 11.1*  NEUTROABS  --   --   --  10.1*  HGB 10.2* 10.5* 10.8* 10.4*  HCT 35.0* 31.0* 36.2 34.1*  MCV 94.1  --  93.5 90.7  PLT 117*  --  129* 0000000    Basic Metabolic Panel: Recent Labs  Lab 11/22/20 2021 11/22/20 2051 11/23/20 0537 11/25/20 0425  NA 135 140 136 134*  K 3.5 3.4* 3.8 3.8  CL 102  --  103 94*  CO2 26  --  26 32  GLUCOSE 155*  --  210* 205*  BUN 10  --  13 25*  CREATININE 0.84  --   0.91 0.84  CALCIUM 8.9  --  9.2 9.2  MG  --   --   --  2.0    GFR: Estimated Creatinine Clearance: 123.4 mL/min (by C-G formula based on SCr of 0.84 mg/dL). Liver Function Tests: Recent Labs  Lab 11/22/20 2021  AST 16  ALT 11  ALKPHOS 69  BILITOT 0.9  PROT 7.1  ALBUMIN 3.3*    No results for input(s): LIPASE, AMYLASE in the last 168 hours. No results for input(s): AMMONIA in the last 168 hours. Coagulation Profile: No results for input(s): INR, PROTIME in the last 168 hours. Cardiac Enzymes: No results for input(s): CKTOTAL, CKMB, CKMBINDEX, TROPONINI in the last 168 hours. BNP (last 3 results) No results for  input(s): PROBNP in the last 8760 hours. HbA1C: Recent Labs    11/24/20 0418  HGBA1C 6.5*    CBG: Recent Labs  Lab 11/24/20 0736 11/24/20 1150 11/24/20 1559 11/24/20 2143 11/25/20 0747  GLUCAP 262* 248* 185* 239* 183*    Lipid Profile: No results for input(s): CHOL, HDL, LDLCALC, TRIG, CHOLHDL, LDLDIRECT in the last 72 hours. Thyroid Function Tests: No results for input(s): TSH, T4TOTAL, FREET4, T3FREE, THYROIDAB in the last 72 hours. Anemia Panel: No results for input(s): VITAMINB12, FOLATE, FERRITIN, TIBC, IRON, RETICCTPCT in the last 72 hours. Sepsis Labs: Recent Labs  Lab 11/22/20 2104 11/22/20 2221 11/24/20 1445  PROCALCITON  --   --  1.39  LATICACIDVEN 2.1* 1.1  --      Recent Results (from the past 240 hour(s))  Blood culture (routine x 2)     Status: None (Preliminary result)   Collection Time: 11/22/20  8:25 PM   Specimen: BLOOD  Result Value Ref Range Status   Specimen Description BLOOD LEFT ANTECUBITAL  Final   Special Requests   Final    BOTTLES DRAWN AEROBIC AND ANAEROBIC Blood Culture adequate volume   Culture   Final    NO GROWTH < 24 HOURS Performed at [facility], [place] [address], [place], [place] [address]    Report Status PENDING  Incomplete  Resp Panel by RT-PCR (Flu A&B, Covid) Nasopharyngeal Swab     Status:  None   Collection Time: 11/22/20  8:30 PM   Specimen: Nasopharyngeal Swab; Nasopharyngeal(NP) swabs in vial transport medium  Result Value Ref Range Status   SARS Coronavirus 2 by RT PCR NEGATIVE NEGATIVE Final    Comment: (NOTE) SARS-CoV-2 target nucleic acids are NOT DETECTED.  The SARS-CoV-2 RNA is generally detectable in upper respiratory specimens during the acute phase of infection. The lowest concentration of SARS-CoV-2 viral copies this assay can detect is 138 copies/mL. A negative result does not preclude SARS-Cov-2 infection and should not be used as the sole basis for treatment or other patient management decisions. A negative result may occur with  improper specimen collection/handling, submission of specimen other than nasopharyngeal swab, presence of viral mutation(s) within the areas targeted by this assay, and inadequate number of viral copies(<138 copies/mL). A negative result must be combined with clinical observations, patient history, and epidemiological information. The expected result is Negative.  Fact Sheet for Patients:  EntrepreneurPulse.com.au  Fact Sheet for Healthcare Providers:  IncredibleEmployment.be  This test is no t yet approved or cleared by the [place] FDA and  has been authorized for detection and/or diagnosis of SARS-CoV-2 by FDA under an Emergency Use Authorization (EUA). This EUA will remain  in effect (meaning this test can be used) for the duration of the COVID-19 declaration under Section 564(b)(1) of the Act, 21 U.S.C.section 360bbb-3(b)(1), unless the authorization is terminated  or revoked sooner.       Influenza A by PCR NEGATIVE NEGATIVE Final   Influenza B by PCR NEGATIVE NEGATIVE Final    Comment: (NOTE) The Xpert Xpress SARS-CoV-2/FLU/RSV plus assay is intended as an aid in the diagnosis of influenza from Nasopharyngeal swab specimens and should not be used as a sole basis for  treatment. Nasal washings and aspirates are unacceptable for Xpert Xpress SARS-CoV-2/FLU/RSV testing.  Fact Sheet for Patients: EntrepreneurPulse.com.au  Fact Sheet for Healthcare Providers: IncredibleEmployment.be  This test is not yet approved or cleared by the [place] FDA and has been authorized for detection and/or diagnosis of SARS-CoV-2 by  FDA under an Emergency Use Authorization (EUA). This EUA will remain in effect (meaning this test can be used) for the duration of the COVID-19 declaration under Section 564(b)(1) of the Act, 21 U.S.C. section 360bbb-3(b)(1), unless the authorization is terminated or revoked.  Performed at [facility], [place] [address] [place], [place] [address]   Blood culture (routine x 2)     Status: None (Preliminary result)   Collection Time: 11/22/20  8:32 PM   Specimen: BLOOD  Result Value Ref Range Status   Specimen Description BLOOD LEFT ANTECUBITAL  Final   Special Requests   Final    BOTTLES DRAWN AEROBIC AND ANAEROBIC Blood Culture adequate volume   Culture   Final    NO GROWTH < 24 HOURS Performed at [facility], [place] [address] [place], [place] [address]    Report Status PENDING  Incomplete          Radiology Studies: CT Angio Chest Pulmonary Embolism (PE) W or WO Contrast  Result Date: 11/23/2020 CLINICAL DATA:  50 year old female with chest pain, shortness of breath after Port-A-Cath flushing. EXAM: CT ANGIOGRAPHY CHEST WITH CONTRAST TECHNIQUE: Multidetector CT imaging of the chest was performed using the standard protocol during bolus administration of intravenous contrast. Multiplanar CT image reconstructions and MIPs were obtained to evaluate the vascular anatomy. CONTRAST:  Forty-five mL Omnipaque 350, intravenous COMPARISON:  11/23/2020, 10/24/2020 FINDINGS: Cardiovascular: Satisfactory opacification of the pulmonary arteries to the segmental level. No evidence of pulmonary  embolism. Mild dilation of the main portal vein measuring up to 30 mm. Prominent azygos vein measuring up to 35 mm at its apex. Due to contrast bolus timing, this is a limited evaluation for patency of this vein. Limited evaluation of the IVC appears within normal limits. Mild scattered atherosclerotic calcification of the thoracic aorta. Normal heart size. No pericardial effusion. Mediastinum/Nodes: Again seen is mid sternal Port-A-Cath with right IJ approach, tip terminating in the right atrium. No enlarged mediastinal, hilar, or axillary lymph nodes. Thyroid gland, trachea, and esophagus demonstrate no significant findings. Lungs/Pleura: Low lung volumes. No focal consolidations. No suspicious pulmonary nodules. No pleural effusion or pneumothorax. Upper Abdomen: The visualized upper abdomen is within normal limits. Musculoskeletal: No chest wall abnormality. No acute or significant osseous findings. Review of the MIP images confirms the above findings. IMPRESSION: Vascular: 1. No evidence of pulmonary embolism. 2. Prominent azygos vein as could be seen with sequela of pulmonary hypertension or caval/truncal venous anomaly. Likely clinically insignificant. 3. Unchanged appearance of mid sternal placed right IJ port. Non-Vascular: No acute intrathoracic abnormality. Ruthann Cancer, MD [facility] Radiology Electronically Signed   By: Ruthann Cancer MD   On: 11/23/2020 10:37   VAS [place] LOWER EXTREMITY VENOUS (DVT)  Result Date: 11/23/2020  Lower Venous DVT Study Patient Name:  [name]  Date of Exam:   11/23/2020 Medical Rec #: [id number]                Accession #:    [id number] Date of Birth: [birth year]                 Patient Gender: F Patient Age:   50Y Exam Location:  [facility] Procedure:      VAS [place] LOWER EXTREMITY VENOUS (DVT) Referring Phys: [id number] [place]  --------------------------------------------------------------------------------  Indications: Swelling, LT leg ulceration.  Anticoagulation: Eliquis. Limitations: Body habitus, poor ultrasound/tissue interface and bandages. Comparison Study: 06-23-2020 Lower extremity reflux bilateral  showed reflux of                   the RT GSV, otherwise negative. Performing Technologist: Darlin Coco RDMS,RVT  Examination Guidelines: A complete evaluation includes B-mode imaging, spectral Doppler, color Doppler, and power Doppler as needed of all accessible portions of each vessel. Bilateral testing is considered an integral part of a complete examination. Limited examinations for reoccurring indications may be performed as noted. The reflux portion of the exam is performed with the patient in reverse Trendelenburg.  +---------+---------------+---------+-----------+----------+---------------+ RIGHT    CompressibilityPhasicitySpontaneityPropertiesThrombus Aging  +---------+---------------+---------+-----------+----------+---------------+ CFV      Full           Yes      Yes                                  +---------+---------------+---------+-----------+----------+---------------+ SFJ      Full                                                         +---------+---------------+---------+-----------+----------+---------------+ FV Prox  Full                                                         +---------+---------------+---------+-----------+----------+---------------+ FV Mid   Full                                                         +---------+---------------+---------+-----------+----------+---------------+ FV DistalFull                                                         +---------+---------------+---------+-----------+----------+---------------+ PFV      Full                                                          +---------+---------------+---------+-----------+----------+---------------+ POP      Full           Yes      Yes                                  +---------+---------------+---------+-----------+----------+---------------+ PTV                     Yes      Yes                  Patent by color +---------+---------------+---------+-----------+----------+---------------+ PERO                    Yes  Yes                  Patent by color +---------+---------------+---------+-----------+----------+---------------+ Gastroc  Full                                                         +---------+---------------+---------+-----------+----------+---------------+   +---------+---------------+---------+-----------+----------+-------------------+ LEFT     CompressibilityPhasicitySpontaneityPropertiesThrombus Aging      +---------+---------------+---------+-----------+----------+-------------------+ CFV      Full           Yes      Yes                                      +---------+---------------+---------+-----------+----------+-------------------+ SFJ      Full                                                             +---------+---------------+---------+-----------+----------+-------------------+ FV Prox  Full                                                             +---------+---------------+---------+-----------+----------+-------------------+ FV Mid   Full                                                             +---------+---------------+---------+-----------+----------+-------------------+ FV Distal               Yes      Yes                  Patent by color     +---------+---------------+---------+-----------+----------+-------------------+ PFV      Full                                                             +---------+---------------+---------+-----------+----------+-------------------+ POP      Full           Yes       Yes                                      +---------+---------------+---------+-----------+----------+-------------------+ PTV                                                   Not visualized                                                            -  bandaging          +---------+---------------+---------+-----------+----------+-------------------+ PERO                                                  Not visualized                                                            -bandaging          +---------+---------------+---------+-----------+----------+-------------------+ Gastroc  Full                                                             +---------+---------------+---------+-----------+----------+-------------------+     Summary: RIGHT: - There is no evidence of deep vein thrombosis in the lower extremity. However, portions of this examination were limited- see technologist comments above.  - No cystic structure found in the popliteal fossa.  LEFT: - There is no evidence of deep vein thrombosis in the lower extremity. However, portions of this examination were limited- see technologist comments above.  - No cystic structure found in the popliteal fossa.  *See table(s) above for measurements and observations. Electronically signed by Servando Snare MD on 11/23/2020 at 2:48:50 PM.    Final         Scheduled Meds:  amLODipine  2.5 mg Oral Daily   apixaban  5 mg Oral BID   atorvastatin  40 mg Oral Daily   carvedilol  25 mg Oral BID WC   Chlorhexidine Gluconate Cloth  6 each Topical Daily   DULoxetine  20 mg Oral Daily   furosemide  80 mg Intravenous Q12H   insulin aspart  0-20 Units Subcutaneous TID WC   insulin aspart  0-5 Units Subcutaneous QHS   irbesartan  75 mg Oral Daily   mometasone-formoterol  2 puff Inhalation BID   potassium chloride SA  20 mEq Oral Daily   spironolactone  25 mg Oral Daily   Continuous Infusions:  ceFEPime (MAXIPIME) IV 2 g  (11/25/20 0509)          Aline August, MD [facility] 11/25/2020, 8:47 AM

## 2020-11-25 NOTE — Progress Notes (Signed)
Progress Note  Patient Name: [name] Date of Encounter: 11/25/2020  Primary Cardiologist: Glenetta Hew, MD   Subjective   Notes significant diuresis overnight.  No change in her SOB.  Feels similar to prior.  Inpatient Medications    Scheduled Meds:  amLODipine  2.5 mg Oral Daily   apixaban  5 mg Oral BID   atorvastatin  40 mg Oral Daily   carvedilol  25 mg Oral BID WC   Chlorhexidine Gluconate Cloth  6 each Topical Daily   DULoxetine  20 mg Oral Daily   furosemide  80 mg Intravenous Q12H   insulin aspart  0-20 Units Subcutaneous TID WC   insulin aspart  0-5 Units Subcutaneous QHS   irbesartan  75 mg Oral Daily   methylPREDNISolone (SOLU-MEDROL) injection  40 mg Intravenous Q12H   mometasone-formoterol  2 puff Inhalation BID   potassium chloride SA  20 mEq Oral Daily   spironolactone  25 mg Oral Daily   Continuous Infusions:  ceFEPime (MAXIPIME) IV 2 g (11/25/20 0509)   PRN Meds: acetaminophen **OR** acetaminophen, albuterol, gabapentin, morphine injection, nitroGLYCERIN, sodium chloride flush   Vital Signs    Vitals:   11/24/20 0748 11/24/20 1602 11/24/20 2146 11/25/20 0325  BP:  132/81 129/82   Pulse:  79    Resp:  14    Temp:  97.9 F (36.6 C) 97.6 F (36.4 C) 97.6 F (36.4 C)  TempSrc:  Oral Oral Oral  SpO2: 95% 100%    Weight:      Height:        Intake/Output Summary (Last 24 hours) at 11/25/2020 [id number] Last data filed at 11/25/2020 0523 Gross per 24 hour  Intake --  Output 1602 ml  Net -1602 ml   Filed Weights   11/23/20 0349 11/24/20 0348 11/24/20 0741  Weight: (!) 145.2 kg (!) 161.6 kg (!) 160.6 kg    Telemetry    Sinus brady to sinus rhythm - Personally Reviewed  ECG    No new - Personally Reviewed  Physical Exam   GEN: No acute distress.  Obese female Neck: Difficult JVD assessment; thick neck Cardiac: RRR, no murmurs, rubs, or gallops, distant heart sounds Respiratory: Clear to auscultation bilaterally but with  poor air movenent GI: Soft, nontender, non-distended  MS: +1 edema; left leg is wrapped Neuro:  Nonfocal  Psych: Normal affect   Labs    Chemistry Recent Labs  Lab 11/22/20 2021 11/22/20 2051 11/23/20 0537 11/25/20 0425  NA 135 140 136 134*  K 3.5 3.4* 3.8 3.8  CL 102  --  103 94*  CO2 26  --  26 32  GLUCOSE 155*  --  210* 205*  BUN 10  --  13 25*  CREATININE 0.84  --  0.91 0.84  CALCIUM 8.9  --  9.2 9.2  PROT 7.1  --   --   --   ALBUMIN 3.3*  --   --   --   AST 16  --   --   --   ALT 11  --   --   --   ALKPHOS 69  --   --   --   BILITOT 0.9  --   --   --   GFRNONAA >60  --  >60 >60  ANIONGAP 7  --  7 8     Hematology Recent Labs  Lab 11/22/20 2021 11/22/20 2051 11/23/20 0537 11/25/20 0425  WBC 6.4  --  9.5 11.1*  RBC 3.72*  --  3.87 3.76*  HGB 10.2* 10.5* 10.8* 10.4*  HCT 35.0* 31.0* 36.2 34.1*  MCV 94.1  --  93.5 90.7  MCH 27.4  --  27.9 27.7  MCHC 29.1*  --  29.8* 30.5  RDW 14.2  --  14.5 14.4  PLT 117*  --  129* 160    Cardiac EnzymesNo results for input(s): TROPONINI in the last 168 hours. No results for input(s): TROPIPOC in the last 168 hours.   BNP Recent Labs  Lab 11/22/20 2338  BNP 46.6     DDimer  Recent Labs  Lab 11/22/20 2302  DDIMER 1.07*     Radiology    CT Angio Chest Pulmonary Embolism (PE) W or WO Contrast  Result Date: 11/23/2020 CLINICAL DATA:  50 year old female with chest pain, shortness of breath after Port-A-Cath flushing. EXAM: CT ANGIOGRAPHY CHEST WITH CONTRAST TECHNIQUE: Multidetector CT imaging of the chest was performed using the standard protocol during bolus administration of intravenous contrast. Multiplanar CT image reconstructions and MIPs were obtained to evaluate the vascular anatomy. CONTRAST:  Forty-five mL Omnipaque 350, intravenous COMPARISON:  11/23/2020, 10/24/2020 FINDINGS: Cardiovascular: Satisfactory opacification of the pulmonary arteries to the segmental level. No evidence of pulmonary embolism. Mild  dilation of the main portal vein measuring up to 30 mm. Prominent azygos vein measuring up to 35 mm at its apex. Due to contrast bolus timing, this is a limited evaluation for patency of this vein. Limited evaluation of the IVC appears within normal limits. Mild scattered atherosclerotic calcification of the thoracic aorta. Normal heart size. No pericardial effusion. Mediastinum/Nodes: Again seen is mid sternal Port-A-Cath with right IJ approach, tip terminating in the right atrium. No enlarged mediastinal, hilar, or axillary lymph nodes. Thyroid gland, trachea, and esophagus demonstrate no significant findings. Lungs/Pleura: Low lung volumes. No focal consolidations. No suspicious pulmonary nodules. No pleural effusion or pneumothorax. Upper Abdomen: The visualized upper abdomen is within normal limits. Musculoskeletal: No chest wall abnormality. No acute or significant osseous findings. Review of the MIP images confirms the above findings. IMPRESSION: Vascular: 1. No evidence of pulmonary embolism. 2. Prominent azygos vein as could be seen with sequela of pulmonary hypertension or caval/truncal venous anomaly. Likely clinically insignificant. 3. Unchanged appearance of mid sternal placed right IJ port. Non-Vascular: No acute intrathoracic abnormality. Ruthann Cancer, MD [facility] Electronically Signed   By: Ruthann Cancer MD   On: 11/23/2020 10:37   VAS [place] LOWER EXTREMITY VENOUS (DVT)  Result Date: 11/23/2020  Lower Venous DVT Study Patient Name:  KAYLANI LANE  Date of Exam:   11/23/2020 Medical Rec #: [id number]                Accession #:    [id number] Date of Birth: 1970-08-22                 Patient Gender: F Patient Age:   50Y Exam Location:  [facility] Procedure:      VAS [place] LOWER EXTREMITY VENOUS (DVT) Referring Phys: [id number] Pace --------------------------------------------------------------------------------   Indications: Swelling, LT leg ulceration.  Anticoagulation: Eliquis. Limitations: Body habitus, poor ultrasound/tissue interface and bandages. Comparison Study: 06-23-2020 Lower extremity reflux bilateral showed reflux of                   the RT GSV, otherwise negative. Performing Technologist: Darlin Coco RDMS,RVT  Examination Guidelines: A complete evaluation includes B-mode imaging, spectral Doppler, color Doppler, and power  Doppler as needed of all accessible portions of each vessel. Bilateral testing is considered an integral part of a complete examination. Limited examinations for reoccurring indications may be performed as noted. The reflux portion of the exam is performed with the patient in reverse Trendelenburg.  +---------+---------------+---------+-----------+----------+---------------+ RIGHT    CompressibilityPhasicitySpontaneityPropertiesThrombus Aging  +---------+---------------+---------+-----------+----------+---------------+ CFV      Full           Yes      Yes                                  +---------+---------------+---------+-----------+----------+---------------+ SFJ      Full                                                         +---------+---------------+---------+-----------+----------+---------------+ FV Prox  Full                                                         +---------+---------------+---------+-----------+----------+---------------+ FV Mid   Full                                                         +---------+---------------+---------+-----------+----------+---------------+ FV DistalFull                                                         +---------+---------------+---------+-----------+----------+---------------+ PFV      Full                                                         +---------+---------------+---------+-----------+----------+---------------+ POP      Full           Yes      Yes                                   +---------+---------------+---------+-----------+----------+---------------+ PTV                     Yes      Yes                  Patent by color +---------+---------------+---------+-----------+----------+---------------+ PERO                    Yes      Yes                  Patent by color +---------+---------------+---------+-----------+----------+---------------+ Gastroc  Full                                                         +---------+---------------+---------+-----------+----------+---------------+   +---------+---------------+---------+-----------+----------+-------------------+  LEFT     CompressibilityPhasicitySpontaneityPropertiesThrombus Aging      +---------+---------------+---------+-----------+----------+-------------------+ CFV      Full           Yes      Yes                                      +---------+---------------+---------+-----------+----------+-------------------+ SFJ      Full                                                             +---------+---------------+---------+-----------+----------+-------------------+ FV Prox  Full                                                             +---------+---------------+---------+-----------+----------+-------------------+ FV Mid   Full                                                             +---------+---------------+---------+-----------+----------+-------------------+ FV Distal               Yes      Yes                  Patent by color     +---------+---------------+---------+-----------+----------+-------------------+ PFV      Full                                                             +---------+---------------+---------+-----------+----------+-------------------+ POP      Full           Yes      Yes                                      +---------+---------------+---------+-----------+----------+-------------------+ PTV                                                    Not visualized                                                            -bandaging          +---------+---------------+---------+-----------+----------+-------------------+ PERO  Not visualized                                                            -bandaging          +---------+---------------+---------+-----------+----------+-------------------+ Gastroc  Full                                                             +---------+---------------+---------+-----------+----------+-------------------+     Summary: RIGHT: - There is no evidence of deep vein thrombosis in the lower extremity. However, portions of this examination were limited- see technologist comments above.  - No cystic structure found in the popliteal fossa.  LEFT: - There is no evidence of deep vein thrombosis in the lower extremity. However, portions of this examination were limited- see technologist comments above.  - No cystic structure found in the popliteal fossa.  *See table(s) above for measurements and observations. Electronically signed by Servando Snare MD on 11/23/2020 at 2:48:50 PM.    Final     Cardiac Studies   Echo is pending  Patient Profile     50 y.o. 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 from 2016 for infusions (still present) who presented with fever, elevated procalcitonin, and SOB  Assessment & Plan    Concern for Heart Failure Presvered Ejection Fraction  - LE edema and difficult exam, BNP only 46 bu in the setting of volume overload - Diuretic regimen: Lasix 80 IV BID; pending Echo - Discussed the importance of fluid restriction of < 2 L, salt restriction, and checking daily weights  - TTE ordered and is pending - SGLT2i would be reasonable after PO transition of lasix - agree with spironolactone and irbesartan start -  atorvastatin 40 mg PO daily reasonable in the setting of DM - Coreg is home medication  Hx of DVT - on eliquis  Sepsis  - on cefepime per primary  We will continue to follow  For questions or updates, please contact [facility] Please consult [email or web address] for contact info under Cardiology/STEMI.      Signed, Werner Lean, MD  11/25/2020, 9:37 AM

## 2020-11-25 NOTE — Progress Notes (Signed)
  Echocardiogram 2D Echocardiogram has been performed.  [name] 11/25/2020, 2:07 PM

## 2020-11-25 NOTE — Plan of Care (Signed)
  Problem: Education: Goal: Knowledge of General Education information will improve Description: Including pain rating scale, medication(s)/side effects and non-pharmacologic comfort measures Outcome: Progressing   Problem: Clinical Measurements: Goal: Diagnostic test results will improve Outcome: Progressing   Problem: Clinical Measurements: Goal: Respiratory complications will improve Outcome: Progressing   Problem: Nutrition: Goal: Adequate nutrition will be maintained Outcome: Progressing

## 2020-11-26 DIAGNOSIS — R509 Fever, unspecified: Secondary | ICD-10-CM | POA: Diagnosis not present

## 2020-11-26 DIAGNOSIS — J4541 Moderate persistent asthma with (acute) exacerbation: Secondary | ICD-10-CM | POA: Diagnosis not present

## 2020-11-26 DIAGNOSIS — Z86718 Personal history of other venous thrombosis and embolism: Secondary | ICD-10-CM | POA: Diagnosis not present

## 2020-11-26 DIAGNOSIS — I700 Atherosclerosis of aorta: Secondary | ICD-10-CM | POA: Diagnosis not present

## 2020-11-26 DIAGNOSIS — I5033 Acute on chronic diastolic (congestive) heart failure: Secondary | ICD-10-CM | POA: Diagnosis not present

## 2020-11-26 DIAGNOSIS — J9601 Acute respiratory failure with hypoxia: Secondary | ICD-10-CM | POA: Diagnosis not present

## 2020-11-26 DIAGNOSIS — D500 Iron deficiency anemia secondary to blood loss (chronic): Secondary | ICD-10-CM | POA: Diagnosis not present

## 2020-11-26 LAB — GLUCOSE, CAPILLARY
Glucose-Capillary: 295 mg/dL — ABNORMAL HIGH (ref 70–99)
Glucose-Capillary: 383 mg/dL — ABNORMAL HIGH (ref 70–99)
Glucose-Capillary: 169 mg/dL — ABNORMAL HIGH (ref 70–99)
Glucose-Capillary: 243 mg/dL — ABNORMAL HIGH (ref 70–99)

## 2020-11-26 MED ORDER — METHYLPREDNISOLONE SODIUM SUCC 40 MG IJ SOLR
40.0000 mg | INTRAMUSCULAR | Status: DC
  Administered 2020-11-27: 40 mg via INTRAVENOUS
  Filled 2020-11-26: qty 1

## 2020-11-26 MED ORDER — DAPAGLIFLOZIN PROPANEDIOL 10 MG PO TABS
10.0000 mg | ORAL_TABLET | Freq: Every day | ORAL | Status: DC
  Administered 2020-11-26 – 2020-11-27 (×2): 10 mg via ORAL
  Filled 2020-11-26 (×2): qty 1

## 2020-11-26 MED ORDER — INSULIN DETEMIR 100 UNIT/ML ~~LOC~~ SOLN
10.0000 [IU] | Freq: Every day | SUBCUTANEOUS | Status: DC
  Administered 2020-11-26 – 2020-11-27 (×2): 10 [IU] via SUBCUTANEOUS
  Filled 2020-11-26 (×2): qty 0.1

## 2020-11-26 MED ORDER — TRAMADOL HCL 50 MG PO TABS
50.0000 mg | ORAL_TABLET | Freq: Four times a day (QID) | ORAL | Status: DC | PRN
  Administered 2020-11-26 (×2): 50 mg via ORAL
  Filled 2020-11-26 (×2): qty 1

## 2020-11-26 NOTE — Progress Notes (Signed)
Patient ID: [name], female   DOB: 1971-01-03, 50 y.o.   MRN: [id number]  PROGRESS NOTE    [name]  [id number] DOB: [birth year] DOA: 11/22/2020 PCP: Ladell Pier, MD   Brief Narrative:  51 y.o. female with medical history significant for HFpEF, DVT on anticoagulation with Eliquis, HTN, asthma, iron deficiency anemia who presented with worsening shortness of breath.  EMS had to put her on nonrebreather because her saturations were 58% on room air.  On presentation, she was febrile to 103F.  Chest x-ray was negative for infiltrates; UA was negative for UTI.  WBCs were normal with normal renal function.  She was given IV antibiotics empirically.  Assessment & Plan:   Fever -Questionable cause.  Presented with temperature of 103 F.  Chest x-ray negative for infiltrates; UA negative for UTI.  COVID-19 and influenza tests were negative.  CT chest without contrast showed findings of possible right heart failure/volume overload; no evidence of pulmonary embolism -Off IV fluids.  Cultures negative so far -Currently afebrile.  Off cefepime since 11/25/2020.  Asthma exacerbation -Slightly improving.  Decrease Solu-Medrol to 40 mg IV every 24hours.  Possibly switch to oral prednisone tomorrow.  Continue Dulera and nebs.  Acute hypoxic respiratory failure -Possibly from asthma exacerbation and CHF exacerbation.  Presented on a nonrebreather.  Respiratory status improving.  Currently on room air.  Acute on chronic diastolic heart failure Chest pain -Patient takes Lasix 80 mg p.o. daily at home.  CT chest showed findings of possible volume overload -Strict input and output.  Daily weights.  Fluid restriction.  Continue Coreg, irbesartan and spironolactone.  Outpatient follow-up with cardiology -Continue IV Lasix 80 mg twice a day and switch to oral Lasix possibly tomorrow once okay with cardiology. -Patient continues to have intermittent chest pain; troponins have  been negative so far.  Cardiology following and 2D echo showed EF of 55 to 60% with grade 1 diastolic dysfunction.  Bilateral lower extremity edema -Patient has chronic bilateral lower extremity edema and has Unna boots.  Lower extremity duplex ultrasound was negative for DVT.  History of DVT on Eliquis -Continue Eliquis  Hypokalemia -Improved  Thrombocytopenia -Questionable cause.  No signs of bleeding.  Resolved  Leukocytosis -No new labs today.  Iron deficiency anemia/anemia of chronic disease -Receives iron infusions at cancer center.  Outpatient follow-up.  Hemoglobin stable.  Diabetes mellitus type 2 with hyperglycemia -A1c 6.5.  Continue CBGs with SSI.  Carb modified diet.  Start Levemir 10 units daily.   DVT prophylaxis: Eliquis Code Status: Full Family Communication: None at bedside Disposition Plan: Status is: Inpatient  Remains inpatient appropriate because:Inpatient level of care appropriate due to severity of illness  Dispo: The patient is from: Home              Anticipated d/c is to: Home once cleared by cardiology              Patient currently is not medically stable to d/c.   Difficult to place patient No  Consultants: Cardiology Procedures: Echo  Antimicrobials:  Anti-infectives (From admission, onward)    Start     Dose/Rate Route Frequency Ordered Stop   11/23/20 0600  ceFEPIme (MAXIPIME) 2 g in sodium chloride 0.9 % 100 mL IVPB  Status:  Discontinued        2 g 200 mL/hr over 30 Minutes Intravenous Every 8 hours 11/22/20 2313 11/25/20 1358   11/23/20 0015  ceFEPIme (MAXIPIME) 1 g in sodium chloride 0.9 %  100 mL IVPB  Status:  Discontinued        1 g 200 mL/hr over 30 Minutes Intravenous Every 8 hours 11/23/20 0001 11/23/20 0011   11/22/20 2045  ceFEPIme (MAXIPIME) 2 g in sodium chloride 0.9 % 100 mL IVPB        2 g 200 mL/hr over 30 Minutes Intravenous  Once 11/22/20 2035 11/22/20 2144   11/22/20 2045  metroNIDAZOLE (FLAGYL) IVPB 500 mg         500 mg 100 mL/hr over 60 Minutes Intravenous  Once 11/22/20 2035 11/22/20 2234         Subjective: Patient seen and examined at bedside.  Still complains of intermittent chest pain.  Shortness of breath is improving.  Denies any overnight fever or vomiting.   Objective: Vitals:   11/25/20 0325 11/25/20 2001 11/25/20 2200 11/26/20 0423  BP:   119/87 (!) 134/96  Pulse:   68   Resp:   18   Temp: 97.6 F (36.4 C)  98.7 F (37.1 C) 97.7 F (36.5 C)  TempSrc: Oral  Oral Oral  SpO2:  98% 97%   Weight:    (!) 162.5 kg  Height:        Intake/Output Summary (Last 24 hours) at 11/26/2020 1009 Last data filed at 11/26/2020 0429 Gross per 24 hour  Intake 240 ml  Output 1201 ml  Net -961 ml    Filed Weights   11/24/20 0348 11/24/20 0741 11/26/20 0423  Weight: (!) 161.6 kg (!) 160.6 kg (!) 162.5 kg    Examination:  General exam: No distress.  On room air  respiratory system: Decreased breath sounds at bases bilaterally with some crackles  cardiovascular system: Rate controlled, S1-S2 heard gastrointestinal system: Abdomen is morbidly obese, still distended, soft and nontender.  Bowel sounds are heard  extremities: Bilateral lower extremity edema present; no cyanosis; lower extremities wrapped bilaterally Central nervous system: Awake and alert.  No focal neurological deficits.  Moves extremities  skin: No obvious rashes/lesions  psychiatry: Affect is mostly flat  Data Reviewed: I have personally reviewed following labs and imaging studies  CBC: Recent Labs  Lab 11/22/20 2021 11/22/20 2051 11/23/20 0537 11/25/20 0425  WBC 6.4  --  9.5 11.1*  NEUTROABS  --   --   --  10.1*  HGB 10.2* 10.5* 10.8* 10.4*  HCT 35.0* 31.0* 36.2 34.1*  MCV 94.1  --  93.5 90.7  PLT 117*  --  129* 0000000    Basic Metabolic Panel: Recent Labs  Lab 11/22/20 2021 11/22/20 2051 11/23/20 0537 11/25/20 0425  NA 135 140 136 134*  K 3.5 3.4* 3.8 3.8  CL 102  --  103 94*  CO2 26  --  26 32   GLUCOSE 155*  --  210* 205*  BUN 10  --  13 25*  CREATININE 0.84  --  0.91 0.84  CALCIUM 8.9  --  9.2 9.2  MG  --   --   --  2.0    GFR: Estimated Creatinine Clearance: 124.4 mL/min (by C-G formula based on SCr of 0.84 mg/dL). Liver Function Tests: Recent Labs  Lab 11/22/20 2021  AST 16  ALT 11  ALKPHOS 69  BILITOT 0.9  PROT 7.1  ALBUMIN 3.3*    No results for input(s): LIPASE, AMYLASE in the last 168 hours. No results for input(s): AMMONIA in the last 168 hours. Coagulation Profile: No results for input(s): INR, PROTIME in the last 168 hours. Cardiac Enzymes:  No results for input(s): CKTOTAL, CKMB, CKMBINDEX, TROPONINI in the last 168 hours. BNP (last 3 results) No results for input(s): PROBNP in the last 8760 hours. HbA1C: Recent Labs    11/24/20 0418  HGBA1C 6.5*    CBG: Recent Labs  Lab 11/25/20 0747 11/25/20 1144 11/25/20 1618 11/25/20 2212 11/26/20 0727  GLUCAP 183* 227* 302* 267* 295*    Lipid Profile: No results for input(s): CHOL, HDL, LDLCALC, TRIG, CHOLHDL, LDLDIRECT in the last 72 hours. Thyroid Function Tests: No results for input(s): TSH, T4TOTAL, FREET4, T3FREE, THYROIDAB in the last 72 hours. Anemia Panel: No results for input(s): VITAMINB12, FOLATE, FERRITIN, TIBC, IRON, RETICCTPCT in the last 72 hours. Sepsis Labs: Recent Labs  Lab 11/22/20 2104 11/22/20 2221 11/24/20 1445  PROCALCITON  --   --  1.39  LATICACIDVEN 2.1* 1.1  --      Recent Results (from the past 240 hour(s))  Blood culture (routine x 2)     Status: None (Preliminary result)   Collection Time: 11/22/20  8:25 PM   Specimen: BLOOD  Result Value Ref Range Status   Specimen Description BLOOD LEFT ANTECUBITAL  Final   Special Requests   Final    BOTTLES DRAWN AEROBIC AND ANAEROBIC Blood Culture adequate volume   Culture   Final    NO GROWTH 3 DAYS Performed at [facility], [place] [address] [place], [place] [address]    Report Status PENDING  Incomplete   Resp Panel by RT-PCR (Flu A&B, Covid) Nasopharyngeal Swab     Status: None   Collection Time: 11/22/20  8:30 PM   Specimen: Nasopharyngeal Swab; Nasopharyngeal(NP) swabs in vial transport medium  Result Value Ref Range Status   SARS Coronavirus 2 by RT PCR NEGATIVE NEGATIVE Final    Comment: (NOTE) SARS-CoV-2 target nucleic acids are NOT DETECTED.  The SARS-CoV-2 RNA is generally detectable in upper respiratory specimens during the acute phase of infection. The lowest concentration of SARS-CoV-2 viral copies this assay can detect is 138 copies/mL. A negative result does not preclude SARS-Cov-2 infection and should not be used as the sole basis for treatment or other patient management decisions. A negative result may occur with  improper specimen collection/handling, submission of specimen other than nasopharyngeal swab, presence of viral mutation(s) within the areas targeted by this assay, and inadequate number of viral copies(<138 copies/mL). A negative result must be combined with clinical observations, patient history, and epidemiological information. The expected result is Negative.  Fact Sheet for Patients:  EntrepreneurPulse.com.au  Fact Sheet for Healthcare Providers:  IncredibleEmployment.be  This test is no t yet approved or cleared by the [place] FDA and  has been authorized for detection and/or diagnosis of SARS-CoV-2 by FDA under an Emergency Use Authorization (EUA). This EUA will remain  in effect (meaning this test can be used) for the duration of the COVID-19 declaration under Section 564(b)(1) of the Act, 21 U.S.C.section 360bbb-3(b)(1), unless the authorization is terminated  or revoked sooner.       Influenza A by PCR NEGATIVE NEGATIVE Final   Influenza B by PCR NEGATIVE NEGATIVE Final    Comment: (NOTE) The Xpert Xpress SARS-CoV-2/FLU/RSV plus assay is intended as an aid in the diagnosis of influenza from  Nasopharyngeal swab specimens and should not be used as a sole basis for treatment. Nasal washings and aspirates are unacceptable for Xpert Xpress SARS-CoV-2/FLU/RSV testing.  Fact Sheet for Patients: EntrepreneurPulse.com.au  Fact Sheet for Healthcare Providers: IncredibleEmployment.be  This test is not yet  approved or cleared by the [place] and has been authorized for detection and/or diagnosis of SARS-CoV-2 by FDA under an Emergency Use Authorization (EUA). This EUA will remain in effect (meaning this test can be used) for the duration of the COVID-19 declaration under Section 564(b)(1) of the Act, 21 U.S.C. section 360bbb-3(b)(1), unless the authorization is terminated or revoked.  Performed at [facility], [place] [name], [place], [place] [address]   Blood culture (routine x 2)     Status: None (Preliminary result)   Collection Time: 11/22/20  8:32 PM   Specimen: BLOOD  Result Value Ref Range Status   Specimen Description BLOOD LEFT ANTECUBITAL  Final   Special Requests   Final    BOTTLES DRAWN AEROBIC AND ANAEROBIC Blood Culture adequate volume   Culture   Final    NO GROWTH 3 DAYS Performed at [facility], [place] [address] [place], [place] [address]    Report Status PENDING  Incomplete          Radiology Studies: ECHOCARDIOGRAM COMPLETE  Result Date: 11/25/2020    ECHOCARDIOGRAM REPORT   Patient Name:   ADISSON LUEBBERING Date of Exam: 11/25/2020 Medical Rec #:  [id number]               Height:       66.0 in Accession #:    [id number]              Weight:       354.1 lb Date of Birth:  20-Jun-1970                BSA:          2.549 m Patient Age:    87 years                BP:           129/82 mmHg Patient Gender: F                       HR:           60 bpm. Exam Location:  Inpatient Procedure: 2D Echo Indications:    chest pain  History:        Patient has no prior history of Echocardiogram examinations.                  Signs/Symptoms:Edema and Fever.  Sonographer:    Johny Chess Referring Phys: [id number] Margie Billet  Sonographer Comments: Patient is morbidly obese. Image acquisition challenging due to patient body habitus and Image acquisition challenging due to respiratory motion. IMPRESSIONS  1. Left ventricular ejection fraction, by estimation, is 55 to 60%. The left ventricle has normal function. The left ventricle has no regional wall motion abnormalities. There is mild concentric left ventricular hypertrophy. Left ventricular diastolic parameters are consistent with Grade I diastolic dysfunction (impaired relaxation).  2. Right ventricular systolic function is normal. The right ventricular size is normal. There is normal pulmonary artery systolic pressure.  3. The mitral valve is normal in structure. No evidence of mitral valve regurgitation. No evidence of mitral stenosis.  4. The aortic valve is tricuspid. Aortic valve regurgitation is not visualized. No aortic stenosis is present.  5. The inferior vena cava is normal in size with <50% respiratory variability, suggesting right atrial pressure of 8 mmHg. FINDINGS  Left Ventricle: Left ventricular ejection fraction, by estimation, is 55 to 60%. The left ventricle has normal function.  The left ventricle has no regional wall motion abnormalities. The left ventricular internal cavity size was normal in size. There is  mild concentric left ventricular hypertrophy. Left ventricular diastolic parameters are consistent with Grade I diastolic dysfunction (impaired relaxation). Indeterminate filling pressures. Right Ventricle: The right ventricular size is normal. No increase in right ventricular wall thickness. Right ventricular systolic function is normal. There is normal pulmonary artery systolic pressure. The tricuspid regurgitant velocity is 1.67 m/s, and  with an assumed right atrial pressure of 8 mmHg, the estimated right ventricular systolic pressure is [id number]  mmHg. Left Atrium: Left atrial size was normal in size. Right Atrium: Right atrial size was normal in size. Pericardium: There is no evidence of pericardial effusion. Mitral Valve: The mitral valve is normal in structure. No evidence of mitral valve regurgitation. No evidence of mitral valve stenosis. Tricuspid Valve: The tricuspid valve is normal in structure. Tricuspid valve regurgitation is trivial. No evidence of tricuspid stenosis. Aortic Valve: The aortic valve is tricuspid. Aortic valve regurgitation is not visualized. No aortic stenosis is present. Pulmonic Valve: The pulmonic valve was normal in structure. Pulmonic valve regurgitation is not visualized. No evidence of pulmonic stenosis. Aorta: The aortic root is normal in size and structure. Venous: The inferior vena cava is normal in size with less than 50% respiratory variability, suggesting right atrial pressure of 8 mmHg. IAS/Shunts: No atrial level shunt detected by color flow Doppler.  LEFT VENTRICLE PLAX 2D LVIDd:         4.70 cm  Diastology LVIDs:         3.30 cm  LV e' medial:    7.07 cm/s LV PW:         1.30 cm  LV E/e' medial:  10.2 LV IVS:        1.20 cm  LV e' lateral:   8.38 cm/s LVOT diam:     1.80 cm  LV E/e' lateral: 8.6 LV SV:         43 LV SV Index:   17 LVOT Area:     2.54 cm  RIGHT VENTRICLE             IVC RV S prime:     17.70 cm/s  IVC diam: 1.90 cm TAPSE (M-mode): 2.1 cm LEFT ATRIUM             Index       RIGHT ATRIUM           Index LA diam:        3.50 cm 1.37 cm/m  RA Area:     10.50 cm LA Vol (A2C):   50.5 ml 19.81 ml/m RA Volume:   20.90 ml  8.20 ml/m LA Vol (A4C):   52.8 ml 20.71 ml/m LA Biplane Vol: 52.5 ml 20.59 ml/m  AORTIC VALVE LVOT Vmax:   89.20 cm/s LVOT Vmean:  58.200 cm/s LVOT VTI:    0.168 m  AORTA Ao Root diam: 2.80 cm Ao Asc diam:  2.80 cm MITRAL VALVE               TRICUSPID VALVE MV Area (PHT): 3.72 cm    TR Peak grad:   11.2 mmHg MV Decel Time: 204 msec    TR Vmax:        167.00 cm/s MV E velocity:  71.80 cm/s MV A velocity: 92.60 cm/s  SHUNTS MV E/A ratio:  0.78        Systemic VTI:  0.17 m  Systemic Diam: 1.80 cm Skeet Latch MD Electronically signed by Skeet Latch MD Signature Date/Time: 11/25/2020/2:39:24 PM    Final         Scheduled Meds:  amLODipine  2.5 mg Oral Daily   apixaban  5 mg Oral BID   atorvastatin  40 mg Oral Daily   carvedilol  25 mg Oral BID WC   Chlorhexidine Gluconate Cloth  6 each Topical Daily   dapagliflozin propanediol  10 mg Oral Daily   DULoxetine  20 mg Oral Daily   furosemide  80 mg Intravenous Q12H   insulin aspart  0-20 Units Subcutaneous TID WC   insulin aspart  0-5 Units Subcutaneous QHS   irbesartan  75 mg Oral Daily   methylPREDNISolone (SOLU-MEDROL) injection  40 mg Intravenous Q12H   mometasone-formoterol  2 puff Inhalation BID   potassium chloride SA  20 mEq Oral Daily   spironolactone  25 mg Oral Daily   Continuous Infusions:          Aline August, MD [facility] 11/26/2020, 10:09 AM

## 2020-11-26 NOTE — Progress Notes (Signed)
Progress Note  Patient Name: Mazarine Porritt Date of Encounter: 11/26/2020  Primary Cardiologist: Glenetta Hew, MD   Subjective   Had echo- mild diastolic dysfunction; estimated LA pressure ~ 14 mm Hg.  Patient notes that she is feeling slightly better and is UOB to chair.  Has pain near her port site on palpation  Inpatient Medications    Scheduled Meds:  amLODipine  2.5 mg Oral Daily   apixaban  5 mg Oral BID   atorvastatin  40 mg Oral Daily   carvedilol  25 mg Oral BID WC   Chlorhexidine Gluconate Cloth  6 each Topical Daily   DULoxetine  20 mg Oral Daily   furosemide  80 mg Intravenous Q12H   insulin aspart  0-20 Units Subcutaneous TID WC   insulin aspart  0-5 Units Subcutaneous QHS   irbesartan  75 mg Oral Daily   methylPREDNISolone (SOLU-MEDROL) injection  40 mg Intravenous Q12H   mometasone-formoterol  2 puff Inhalation BID   potassium chloride SA  20 mEq Oral Daily   spironolactone  25 mg Oral Daily   Continuous Infusions:   PRN Meds: acetaminophen **OR** acetaminophen, albuterol, gabapentin, morphine injection, nitroGLYCERIN, sodium chloride flush   Vital Signs    Vitals:   11/25/20 0325 11/25/20 2001 11/25/20 2200 11/26/20 0423  BP:   119/87 (!) 134/96  Pulse:   68   Resp:   18   Temp: 97.6 F (36.4 C)  98.7 F (37.1 C) 97.7 F (36.5 C)  TempSrc: Oral  Oral Oral  SpO2:  98% 97%   Weight:    (!) 162.5 kg  Height:        Intake/Output Summary (Last 24 hours) at 11/26/2020 0912 Last data filed at 11/26/2020 0429 Gross per 24 hour  Intake 240 ml  Output 1201 ml  Net -961 ml   Filed Weights   11/24/20 0348 11/24/20 0741 11/26/20 0423  Weight: (!) 161.6 kg (!) 160.6 kg (!) 162.5 kg    Telemetry    SR - Personally Reviewed  ECG    No new last 24 hours - Personally Reviewed  Physical Exam   GEN: No acute distress.  Obese female Neck: Difficult JVD assessment; thick neck Cardiac: RRR, no murmurs, rubs, or gallops, distant heart  sounds, on palpation near port chest pain is reproduced.  No loculation noted Respiratory: Clear to auscultation bilaterally but with poor air movement (patient pants when asked to inspire) GI: Soft, nontender, non-distended  MS: +1 edema; left leg is wrapped Neuro:  Nonfocal  Psych: Normal affect   Labs    Chemistry Recent Labs  Lab 11/22/20 2021 11/22/20 2051 11/23/20 0537 11/25/20 0425  NA 135 140 136 134*  K 3.5 3.4* 3.8 3.8  CL 102  --  103 94*  CO2 26  --  26 32  GLUCOSE 155*  --  210* 205*  BUN 10  --  13 25*  CREATININE 0.84  --  0.91 0.84  CALCIUM 8.9  --  9.2 9.2  PROT 7.1  --   --   --   ALBUMIN 3.3*  --   --   --   AST 16  --   --   --   ALT 11  --   --   --   ALKPHOS 69  --   --   --   BILITOT 0.9  --   --   --   GFRNONAA >60  --  >60 >60  ANIONGAP 7  --  7 8     Hematology Recent Labs  Lab 11/22/20 2021 11/22/20 2051 11/23/20 0537 11/25/20 0425  WBC 6.4  --  9.5 11.1*  RBC 3.72*  --  3.87 3.76*  HGB 10.2* 10.5* 10.8* 10.4*  HCT 35.0* 31.0* 36.2 34.1*  MCV 94.1  --  93.5 90.7  MCH 27.4  --  27.9 27.7  MCHC 29.1*  --  29.8* 30.5  RDW 14.2  --  14.5 14.4  PLT 117*  --  129* 160    Cardiac EnzymesNo results for input(s): TROPONINI in the last 168 hours. No results for input(s): TROPIPOC in the last 168 hours.   BNP Recent Labs  Lab 11/22/20 2338  BNP 46.6     DDimer  Recent Labs  Lab 11/22/20 2302  DDIMER 1.07*     Radiology    ECHOCARDIOGRAM COMPLETE  Result Date: 11/25/2020    ECHOCARDIOGRAM REPORT   Patient Name:   Casaundra Deliah Goody Date of Exam: 11/25/2020 Medical Rec #:  [id number]               Height:       66.0 in Accession #:    [id number]              Weight:       354.1 lb Date of Birth:  1971/02/19                BSA:          2.549 m Patient Age:    50 years                BP:           129/82 mmHg Patient Gender: F                       HR:           60 bpm. Exam Location:  Inpatient Procedure: 2D Echo Indications:     chest pain  History:        Patient has no prior history of Echocardiogram examinations.                 Signs/Symptoms:Edema and Fever.  Sonographer:    Johny Chess Referring Phys: [id number] Margie Billet  Sonographer Comments: Patient is morbidly obese. Image acquisition challenging due to patient body habitus and Image acquisition challenging due to respiratory motion. IMPRESSIONS  1. Left ventricular ejection fraction, by estimation, is 55 to 60%. The left ventricle has normal function. The left ventricle has no regional wall motion abnormalities. There is mild concentric left ventricular hypertrophy. Left ventricular diastolic parameters are consistent with Grade I diastolic dysfunction (impaired relaxation).  2. Right ventricular systolic function is normal. The right ventricular size is normal. There is normal pulmonary artery systolic pressure.  3. The mitral valve is normal in structure. No evidence of mitral valve regurgitation. No evidence of mitral stenosis.  4. The aortic valve is tricuspid. Aortic valve regurgitation is not visualized. No aortic stenosis is present.  5. The inferior vena cava is normal in size with <50% respiratory variability, suggesting right atrial pressure of 8 mmHg. FINDINGS  Left Ventricle: Left ventricular ejection fraction, by estimation, is 55 to 60%. The left ventricle has normal function. The left ventricle has no regional wall motion abnormalities. The left ventricular internal cavity size was normal in size. There is  mild concentric left ventricular hypertrophy. Left ventricular diastolic parameters  are consistent with Grade I diastolic dysfunction (impaired relaxation). Indeterminate filling pressures. Right Ventricle: The right ventricular size is normal. No increase in right ventricular wall thickness. Right ventricular systolic function is normal. There is normal pulmonary artery systolic pressure. The tricuspid regurgitant velocity is 1.67 m/s, and  with an assumed  right atrial pressure of 8 mmHg, the estimated right ventricular systolic pressure is Q000111Q mmHg. Left Atrium: Left atrial size was normal in size. Right Atrium: Right atrial size was normal in size. Pericardium: There is no evidence of pericardial effusion. Mitral Valve: The mitral valve is normal in structure. No evidence of mitral valve regurgitation. No evidence of mitral valve stenosis. Tricuspid Valve: The tricuspid valve is normal in structure. Tricuspid valve regurgitation is trivial. No evidence of tricuspid stenosis. Aortic Valve: The aortic valve is tricuspid. Aortic valve regurgitation is not visualized. No aortic stenosis is present. Pulmonic Valve: The pulmonic valve was normal in structure. Pulmonic valve regurgitation is not visualized. No evidence of pulmonic stenosis. Aorta: The aortic root is normal in size and structure. Venous: The inferior vena cava is normal in size with less than 50% respiratory variability, suggesting right atrial pressure of 8 mmHg. IAS/Shunts: No atrial level shunt detected by color flow Doppler.  LEFT VENTRICLE PLAX 2D LVIDd:         4.70 cm  Diastology LVIDs:         3.30 cm  LV e' medial:    7.07 cm/s LV PW:         1.30 cm  LV E/e' medial:  10.2 LV IVS:        1.20 cm  LV e' lateral:   8.38 cm/s LVOT diam:     1.80 cm  LV E/e' lateral: 8.6 LV SV:         43 LV SV Index:   17 LVOT Area:     2.54 cm  RIGHT VENTRICLE             IVC RV S prime:     17.70 cm/s  IVC diam: 1.90 cm TAPSE (M-mode): 2.1 cm LEFT ATRIUM             Index       RIGHT ATRIUM           Index LA diam:        3.50 cm 1.37 cm/m  RA Area:     10.50 cm LA Vol (A2C):   50.5 ml 19.81 ml/m RA Volume:   20.90 ml  8.20 ml/m LA Vol (A4C):   52.8 ml 20.71 ml/m LA Biplane Vol: 52.5 ml 20.59 ml/m  AORTIC VALVE LVOT Vmax:   89.20 cm/s LVOT Vmean:  58.200 cm/s LVOT VTI:    0.168 m  AORTA Ao Root diam: 2.80 cm Ao Asc diam:  2.80 cm MITRAL VALVE               TRICUSPID VALVE MV Area (PHT): 3.72 cm    TR Peak  grad:   11.2 mmHg MV Decel Time: 204 msec    TR Vmax:        167.00 cm/s MV E velocity: 71.80 cm/s MV A velocity: 92.60 cm/s  SHUNTS MV E/A ratio:  0.78        Systemic VTI:  0.17 m                            Systemic Diam: 1.80 cm Skeet Latch  MD Electronically signed by Skeet Latch MD Signature Date/Time: 11/25/2020/2:39:24 PM    Final       Patient Profile     50 y.o. 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 from 2016 for infusions (still present) who presented with fever, elevated procalcitonin, and SOB  Assessment & Plan    Heart Failure Presvered Ejection Fraction  - Diuretic regimen: Lasix 80 IV BID continued, potential 11/27/20 transition - Discussed the importance of fluid restriction of < 2 L, salt restriction, and checking daily weights  - agree with spironolactone and irbesartan start - atorvastatin 40 mg PO daily reasonable in the setting of DM - Coreg is home medication (not indicated purely for HFpEF) - adding SGLT2i today also for BG control; will need peri-discharge for optimally insurance covered agent  Hx of DVT - on eliquis  Non cardiac CP - representable on palpation near port; if no improvement CXR could be considered; long term unclear why patient still have port - as per primary  Sepsis  - finished ABx  We will continue to follow  Discussed with primary MD  For questions or updates, please contact [facility] Please consult [email or web address] for contact info under Cardiology/STEMI.      Signed, Werner Lean, MD  11/26/2020, 9:12 AM

## 2020-11-27 DIAGNOSIS — J4541 Moderate persistent asthma with (acute) exacerbation: Secondary | ICD-10-CM | POA: Diagnosis not present

## 2020-11-27 DIAGNOSIS — Z86718 Personal history of other venous thrombosis and embolism: Secondary | ICD-10-CM | POA: Diagnosis not present

## 2020-11-27 DIAGNOSIS — I5033 Acute on chronic diastolic (congestive) heart failure: Secondary | ICD-10-CM | POA: Diagnosis not present

## 2020-11-27 DIAGNOSIS — J9601 Acute respiratory failure with hypoxia: Secondary | ICD-10-CM | POA: Diagnosis not present

## 2020-11-27 DIAGNOSIS — R509 Fever, unspecified: Secondary | ICD-10-CM | POA: Diagnosis not present

## 2020-11-27 LAB — BASIC METABOLIC PANEL
Anion gap: 9 (ref 5–15)
BUN: 33 mg/dL — ABNORMAL HIGH (ref 6–20)
CO2: 32 mmol/L (ref 22–32)
Calcium: 9.3 mg/dL (ref 8.9–10.3)
Chloride: 90 mmol/L — ABNORMAL LOW (ref 98–111)
Creatinine, Ser: 0.96 mg/dL (ref 0.44–1.00)
GFR, Estimated: >60 mL/min (ref >60)
Glucose, Bld: 263 mg/dL — ABNORMAL HIGH (ref 70–99)
Potassium: 4 mmol/L (ref 3.5–5.1)
Sodium: 131 mmol/L — ABNORMAL LOW (ref 135–145)

## 2020-11-27 LAB — GLUCOSE, CAPILLARY
Glucose-Capillary: 169 mg/dL — ABNORMAL HIGH (ref 70–99)
Glucose-Capillary: 165 mg/dL — ABNORMAL HIGH (ref 70–99)

## 2020-11-27 LAB — CULTURE, BLOOD (ROUTINE X 2)
Culture: NO GROWTH
Culture: NO GROWTH
Special Requests: ADEQUATE
Special Requests: ADEQUATE

## 2020-11-27 LAB — MAGNESIUM: Magnesium: 2.2 mg/dL (ref 1.7–2.4)

## 2020-11-27 MED ORDER — TRAMADOL HCL 50 MG PO TABS: 50.0000 mg | ORAL_TABLET | Freq: Four times a day (QID) | ORAL | 0 refills | Status: DC | PRN

## 2020-11-27 MED ORDER — MOMETASONE FURO-FORMOTEROL FUM 200-5 MCG/ACT IN AERO: 2.0000 | INHALATION_SPRAY | Freq: Two times a day (BID) | RESPIRATORY_TRACT | 0 refills | Status: DC

## 2020-11-27 MED ORDER — HEPARIN SOD (PORK) LOCK FLUSH 100 UNIT/ML IV SOLN
500.0000 [IU] | INTRAVENOUS | Status: AC | PRN
  Administered 2020-11-27: 500 [IU]
  Filled 2020-11-27: qty 5

## 2020-11-27 MED ORDER — PREDNISONE 20 MG PO TABS: 40.0000 mg | ORAL_TABLET | Freq: Every day | ORAL | 0 refills | Status: AC

## 2020-11-27 MED ORDER — DAPAGLIFLOZIN PROPANEDIOL 10 MG PO TABS: 10.0000 mg | ORAL_TABLET | Freq: Every day | ORAL | 0 refills | Status: DC

## 2020-11-27 MED ORDER — NITROGLYCERIN 0.4 MG SL SUBL
0.4000 mg | SUBLINGUAL_TABLET | SUBLINGUAL | 0 refills | Status: DC | PRN
Start: 2020-11-27 — End: 2021-11-22

## 2020-11-27 NOTE — Progress Notes (Signed)
Progress Note  Patient Name: Michaiah Muzzi Date of Encounter: 11/27/2020  Primary Cardiologist:   Glenetta Hew, MD   Subjective   She does not report SOB.  No chest pain.  She describes pain all over her body and is requesting that she goes home with a pain med  Inpatient Medications    Scheduled Meds:  amLODipine  2.5 mg Oral Daily   apixaban  5 mg Oral BID   atorvastatin  40 mg Oral Daily   carvedilol  25 mg Oral BID WC   Chlorhexidine Gluconate Cloth  6 each Topical Daily   dapagliflozin propanediol  10 mg Oral Daily   DULoxetine  20 mg Oral Daily   furosemide  80 mg Intravenous Q12H   insulin aspart  0-20 Units Subcutaneous TID WC   insulin aspart  0-5 Units Subcutaneous QHS   insulin detemir  10 Units Subcutaneous Daily   irbesartan  75 mg Oral Daily   methylPREDNISolone (SOLU-MEDROL) injection  40 mg Intravenous Q24H   mometasone-formoterol  2 puff Inhalation BID   potassium chloride SA  20 mEq Oral Daily   spironolactone  25 mg Oral Daily   Continuous Infusions:  PRN Meds: acetaminophen **OR** acetaminophen, albuterol, gabapentin, morphine injection, nitroGLYCERIN, sodium chloride flush, traMADol   Vital Signs    Vitals:   11/27/20 0530 11/27/20 0600 11/27/20 0630 11/27/20 0724  BP:      Pulse: 91 69 63 77  Resp: (!) '27 11 12 15  '$ Temp:      TempSrc:      SpO2: (!) 75% 98% 96% 98%  Weight:      Height:        Intake/Output Summary (Last 24 hours) at 11/27/2020 1029 Last data filed at 11/26/2020 2351 Gross per 24 hour  Intake 240 ml  Output 2 ml  Net 238 ml   Filed Weights   11/24/20 0741 11/26/20 0423 11/27/20 0424  Weight: (!) 160.6 kg (!) 162.5 kg (!) 160.5 kg    Telemetry    NSR - Personally Reviewed  ECG    NA - Personally Reviewed  Physical Exam   GEN: No acute distress.   Neck: No  JVD Cardiac: RRR, no murmurs, rubs, or gallops.  Respiratory: Clear  to auscultation bilaterally. GI: Soft, nontender, non-distended  MS:     Moderate right greater than left leg edema; No deformity.  Positive lymphedema Neuro:  Nonfocal  Psych: Normal affect   Labs    Chemistry Recent Labs  Lab 11/22/20 2021 11/22/20 2051 11/23/20 0537 11/25/20 0425 11/27/20 0150  NA 135   < > 136 134* 131*  K 3.5   < > 3.8 3.8 4.0  CL 102  --  103 94* 90*  CO2 26  --  26 32 32  GLUCOSE 155*  --  210* 205* 263*  BUN 10  --  13 25* 33*  CREATININE 0.84  --  0.91 0.84 0.96  CALCIUM 8.9  --  9.2 9.2 9.3  PROT 7.1  --   --   --   --   ALBUMIN 3.3*  --   --   --   --   AST 16  --   --   --   --   ALT 11  --   --   --   --   ALKPHOS 69  --   --   --   --   BILITOT 0.9  --   --   --   --  GFRNONAA >60  --  >60 >60 >60  ANIONGAP 7  --  '7 8 9   '$ < > = values in this interval not displayed.     Hematology Recent Labs  Lab 11/22/20 2021 11/22/20 2051 11/23/20 0537 11/25/20 0425  WBC 6.4  --  9.5 11.1*  RBC 3.72*  --  3.87 3.76*  HGB 10.2* 10.5* 10.8* 10.4*  HCT 35.0* 31.0* 36.2 34.1*  MCV 94.1  --  93.5 90.7  MCH 27.4  --  27.9 27.7  MCHC 29.1*  --  29.8* 30.5  RDW 14.2  --  14.5 14.4  PLT 117*  --  129* 160    Cardiac EnzymesNo results for input(s): TROPONINI in the last 168 hours. No results for input(s): TROPIPOC in the last 168 hours.   BNP Recent Labs  Lab 11/22/20 2338  BNP 46.6     DDimer  Recent Labs  Lab 11/22/20 2302  DDIMER 1.07*     Radiology    ECHOCARDIOGRAM COMPLETE  Result Date: 11/25/2020    ECHOCARDIOGRAM REPORT   Patient Name:   Dareth Deliah Goody Date of Exam: 11/25/2020 Medical Rec #:  [id number]               Height:       66.0 in Accession #:    [id number]              Weight:       354.1 lb Date of Birth:  1970-06-10                BSA:          2.549 m Patient Age:    67 years                BP:           129/82 mmHg Patient Gender: F                       HR:           60 bpm. Exam Location:  Inpatient Procedure: 2D Echo Indications:    chest pain  History:        Patient has no  prior history of Echocardiogram examinations.                 Signs/Symptoms:Edema and Fever.  Sonographer:    Johny Chess Referring Phys: [id number] Margie Billet  Sonographer Comments: Patient is morbidly obese. Image acquisition challenging due to patient body habitus and Image acquisition challenging due to respiratory motion. IMPRESSIONS  1. Left ventricular ejection fraction, by estimation, is 55 to 60%. The left ventricle has normal function. The left ventricle has no regional wall motion abnormalities. There is mild concentric left ventricular hypertrophy. Left ventricular diastolic parameters are consistent with Grade I diastolic dysfunction (impaired relaxation).  2. Right ventricular systolic function is normal. The right ventricular size is normal. There is normal pulmonary artery systolic pressure.  3. The mitral valve is normal in structure. No evidence of mitral valve regurgitation. No evidence of mitral stenosis.  4. The aortic valve is tricuspid. Aortic valve regurgitation is not visualized. No aortic stenosis is present.  5. The inferior vena cava is normal in size with <50% respiratory variability, suggesting right atrial pressure of 8 mmHg. FINDINGS  Left Ventricle: Left ventricular ejection fraction, by estimation, is 55 to 60%. The left ventricle has normal function. The left ventricle has no regional wall motion abnormalities. The  left ventricular internal cavity size was normal in size. There is  mild concentric left ventricular hypertrophy. Left ventricular diastolic parameters are consistent with Grade I diastolic dysfunction (impaired relaxation). Indeterminate filling pressures. Right Ventricle: The right ventricular size is normal. No increase in right ventricular wall thickness. Right ventricular systolic function is normal. There is normal pulmonary artery systolic pressure. The tricuspid regurgitant velocity is 1.67 m/s, and  with an assumed right atrial pressure of 8 mmHg, the  estimated right ventricular systolic pressure is Q000111Q mmHg. Left Atrium: Left atrial size was normal in size. Right Atrium: Right atrial size was normal in size. Pericardium: There is no evidence of pericardial effusion. Mitral Valve: The mitral valve is normal in structure. No evidence of mitral valve regurgitation. No evidence of mitral valve stenosis. Tricuspid Valve: The tricuspid valve is normal in structure. Tricuspid valve regurgitation is trivial. No evidence of tricuspid stenosis. Aortic Valve: The aortic valve is tricuspid. Aortic valve regurgitation is not visualized. No aortic stenosis is present. Pulmonic Valve: The pulmonic valve was normal in structure. Pulmonic valve regurgitation is not visualized. No evidence of pulmonic stenosis. Aorta: The aortic root is normal in size and structure. Venous: The inferior vena cava is normal in size with less than 50% respiratory variability, suggesting right atrial pressure of 8 mmHg. IAS/Shunts: No atrial level shunt detected by color flow Doppler.  LEFT VENTRICLE PLAX 2D LVIDd:         4.70 cm  Diastology LVIDs:         3.30 cm  LV e' medial:    7.07 cm/s LV PW:         1.30 cm  LV E/e' medial:  10.2 LV IVS:        1.20 cm  LV e' lateral:   8.38 cm/s LVOT diam:     1.80 cm  LV E/e' lateral: 8.6 LV SV:         43 LV SV Index:   17 LVOT Area:     2.54 cm  RIGHT VENTRICLE             IVC RV S prime:     17.70 cm/s  IVC diam: 1.90 cm TAPSE (M-mode): 2.1 cm LEFT ATRIUM             Index       RIGHT ATRIUM           Index LA diam:        3.50 cm 1.37 cm/m  RA Area:     10.50 cm LA Vol (A2C):   50.5 ml 19.81 ml/m RA Volume:   20.90 ml  8.20 ml/m LA Vol (A4C):   52.8 ml 20.71 ml/m LA Biplane Vol: 52.5 ml 20.59 ml/m  AORTIC VALVE LVOT Vmax:   89.20 cm/s LVOT Vmean:  58.200 cm/s LVOT VTI:    0.168 m  AORTA Ao Root diam: 2.80 cm Ao Asc diam:  2.80 cm MITRAL VALVE               TRICUSPID VALVE MV Area (PHT): 3.72 cm    TR Peak grad:   11.2 mmHg MV Decel Time: 204  msec    TR Vmax:        167.00 cm/s MV E velocity: 71.80 cm/s MV A velocity: 92.60 cm/s  SHUNTS MV E/A ratio:  0.78        Systemic VTI:  0.17 m  Systemic Diam: 1.80 cm Skeet Latch MD Electronically signed by Skeet Latch MD Signature Date/Time: 11/25/2020/2:39:24 PM    Final     Cardiac Studies   ECHO:   1. Left ventricular ejection fraction, by estimation, is 55 to 60%. The  left ventricle has normal function. The left ventricle has no regional  wall motion abnormalities. There is mild concentric left ventricular  hypertrophy. Left ventricular diastolic  parameters are consistent with Grade I diastolic dysfunction (impaired  relaxation).   2. Right ventricular systolic function is normal. The right ventricular  size is normal. There is normal pulmonary artery systolic pressure.   3. The mitral valve is normal in structure. No evidence of mitral valve  regurgitation. No evidence of mitral stenosis.   4. The aortic valve is tricuspid. Aortic valve regurgitation is not  visualized. No aortic stenosis is present.   5. The inferior vena cava is normal in size with <50% respiratory  variability, suggesting right atrial pressure of 8 mmHg.   Patient Profile     50 y.o. female with history of morbid obesity and T2DM, DVT s/p IVC filter, profound IDA who has a port from 2016 for infusions (still present) who presented with fever, elevated procalcitonin, and SOB  Assessment & Plan    Acute on chronic diastolic HF:  Started on dapagliflozin yesterday.   Net negative is 4.3 liters.   I would suggest continue the 80 mg of Lasix at discharge and the spironolactone that she was previously on .   History of DVT:  Continue Eliquis.    Chest pain:  Non anginal.  No further cardiac work up.     For questions or updates, please contact [facility] Please consult [email or web address] for contact info under Cardiology/STEMI.   Signed, Minus Breeding, MD  11/27/2020,  10:29 AM

## 2020-11-27 NOTE — Discharge Summary (Signed)
Physician Discharge Summary  [name] POE:[id number] DOB: 15-Jan-1971 DOA: 11/22/2020  PCP: Ladell Pier, MD  Admit date: 11/22/2020 Discharge date: 11/27/2020  Admitted From: Home Disposition: Home  Recommendations for Outpatient Follow-up:  Follow up with PCP in 1 week with repeat CBC/BMP Outpatient follow-up with cardiology Outpatient evaluation and follow-up by pulmonary Follow up in ED if symptoms worsen or new appear   Home Health: No Equipment/Devices: None  Discharge Condition: Stable CODE STATUS: Full Diet recommendation: Heart healthy/carb modified/fluid restriction of up to 1500 cc a day  Brief/Interim Summary: 50 y.o. female with medical history significant for HFpEF, DVT on anticoagulation with Eliquis, HTN, asthma, iron deficiency anemia who presented with worsening shortness of breath.  EMS had to put her on nonrebreather because her saturations were 58% on room air.  On presentation, she was febrile to 103F.  Chest x-ray was negative for infiltrates; UA was negative for UTI.  WBCs were normal with normal renal function.  She was given IV antibiotics empirically.  Given the hospitalization, her condition gradually improved.  She became afebrile.  Cultures were negative.  Cefepime discontinued on 11/25/2020.  Cardiology was consulted for CHF exacerbation and she was treated with IV Lasix.  Solu-Medrol was also used for asthma exacerbation and gradually tapered.  Her respiratory status has greatly improved.  Currently afebrile.  Cardiology has cleared the patient for discharge today on oral Lasix.  Outpatient follow-up with PCP/cardiology and would benefit from follow-up with pulmonary as well.  Discharge Diagnoses:  Fever -Questionable cause.  Presented with temperature of 103 F.  Chest x-ray negative for infiltrates; UA negative for UTI.  COVID-19 and influenza tests were negative.  CT chest without contrast showed findings of possible right heart  failure/volume overload; no evidence of pulmonary embolism -Off IV fluids.  Cultures negative so far -Currently afebrile.  Off cefepime since 11/25/2020.   Asthma exacerbation -Improving.  Treated with tapering doses of IV Solu-Medrol along with Dulera and nebs. -Continue prednisone 40 mg daily for 7 days upon discharge.  Continue as needed albuterol.  Continue Dulera.  Outpatient evaluation and follow-up by pulmonary  Acute hypoxic respiratory failure -Possibly from asthma exacerbation and CHF exacerbation.  Presented on a nonrebreather.  Respiratory status much improved.  Currently on room air.   Acute on chronic diastolic heart failure Chest pain -Patient takes Lasix 80 mg p.o. daily at home.  CT chest showed findings of possible volume overload -Continue diet and fluid restriction. -Continue Coreg, irbesartan and spironolactone.  Outpatient follow-up with cardiology -Currently on IV Lasix 80 mg twice a day  -Chest pain improving.  Troponins negative.  Cardiology following and 2D echo showed EF of 55 to 60% with grade 1 diastolic dysfunction. -Cardiology has cleared the patient for discharge today on Lasix 80 mg p.o. daily and spironolactone.  Outpatient follow-up with cardiology.   Bilateral lower extremity edema -Patient has chronic bilateral lower extremity edema and has Unna boots.  Lower extremity duplex ultrasound was negative for DVT.   History of DVT on Eliquis -Continue Eliquis   Hypokalemia -Improved  Thrombocytopenia -Questionable cause.  No signs of bleeding.  Resolved   Leukocytosis -No new labs today.  Iron deficiency anemia/anemia of chronic disease -Receives iron infusions at cancer center.  Outpatient follow-up.  Hemoglobin stable recently.   Diabetes mellitus type 2 with hyperglycemia -A1c 6.5.  Carb modified diet.  Wilder Glade has been started by cardiology which will be continued.  Outpatient follow-up with PCP regarding need for other meds.  Discharge  Instructions  Discharge Instructions     Ambulatory referral to Pulmonology   Complete by: As directed    Reason for referral: Asthma/COPD   Diet - low sodium heart healthy   Complete by: As directed    Diet Carb Modified   Complete by: As directed    Increase activity slowly   Complete by: As directed       Allergies as of 11/27/2020       Reactions   Ace Inhibitors Rash, Other (See Comments)   Make pt bleed   Aspirin Other (See Comments)   Per patient paperwork: blood clot?  Likely because of chronic DOAC   Hydromorphone Hives, Itching   Vancomycin Itching, Rash   Contrast Media [iodinated Diagnostic Agents] Hives   Dilaudid [hydromorphone Hcl] Hives        Medication List     STOP taking these medications    Contrast Allergy PreMed Pack 3 x 50 MG & 1 x 50 MG Kit Generic drug: predniSONE & diphenhydrAMINE   DULoxetine 20 MG capsule Commonly known as: CYMBALTA   Klor-Con M20 20 MEQ tablet Generic drug: potassium chloride SA       TAKE these medications    acetaminophen 500 MG tablet Commonly known as: TYLENOL Take 1 tablet (500 mg total) by mouth every 6 (six) hours as needed.   albuterol 108 (90 Base) MCG/ACT inhaler Commonly known as: ProAir HFA INHALE TWO PUFFS BY MOUTH EVERY 6 HOURS AS NEEDED FOR WHEEZING OR SHORTNESS OF BREATH What changed:  how much to take how to take this when to take this reasons to take this additional instructions   amLODipine 5 MG tablet Commonly known as: NORVASC Take 0.5 tablets (2.5 mg total) by mouth daily.   apixaban 5 MG Tabs tablet Commonly known as: Eliquis Take 1 tablet (5 mg total) by mouth 2 (two) times daily.   atorvastatin 40 MG tablet Commonly known as: LIPITOR Take 1 tablet (40 mg total) by mouth daily.   carvedilol 25 MG tablet Commonly known as: COREG TAKE ONE TABLET BY MOUTH TWICE A DAY WITH A MEAL What changed:  how much to take how to take this when to take this additional instructions    cholecalciferol 25 MCG (1000 UNIT) tablet Commonly known as: VITAMIN D3 Take 1 tablet (1,000 Units total) by mouth daily.   dapagliflozin propanediol 10 MG Tabs tablet Commonly known as: FARXIGA Take 1 tablet (10 mg total) by mouth daily. Start taking on: November 28, 2020   EYE DROPS OP Place 1 drop into both eyes daily as needed (dry eyes).   furosemide 80 MG tablet Commonly known as: LASIX Take 1 tablet (80 mg total) by mouth daily.   gabapentin 100 MG capsule Commonly known as: NEURONTIN Take 100 mg by mouth 3 (three) times daily as needed (pain).   hydrocortisone cream 1 % Apply 1 application topically daily as needed for itching.   methocarbamol 500 MG tablet Commonly known as: ROBAXIN TAKE ONE TABLET BY MOUTH DAILY AS NEEDED FOR MUSCLE SPASMS What changed:  how much to take how to take this when to take this reasons to take this additional instructions   mometasone-formoterol 200-5 MCG/ACT Aero Commonly known as: DULERA Inhale 2 puffs into the lungs 2 (two) times daily.   nitroGLYCERIN 0.4 MG SL tablet Commonly known as: NITROSTAT Place 1 tablet (0.4 mg total) under the tongue every 5 (five) minutes as needed for chest pain.   omeprazole 20 MG  capsule Commonly known as: PRILOSEC Take 20 mg by mouth daily as needed (acid reflux).   PARoxetine 10 MG tablet Commonly known as: PAXIL TAKE ONE TABLET BY MOUTH DAILY   predniSONE 20 MG tablet Commonly known as: DELTASONE Take 2 tablets (40 mg total) by mouth daily with breakfast for 7 days.   spironolactone 25 MG tablet Commonly known as: ALDACTONE Take 1 tablet (25 mg total) by mouth daily.   traMADol 50 MG tablet Commonly known as: ULTRAM Take 1 tablet (50 mg total) by mouth every 6 (six) hours as needed for moderate pain.   valsartan 80 MG tablet Commonly known as: Diovan Take 1 tablet (80 mg total) by mouth daily. What changed: when to take this   vitamin B-12 1000 MCG tablet Commonly known as:  CYANOCOBALAMIN Take 1,000 mcg by mouth daily.        Follow-up Information     Leonie Man, MD Follow up on 01/29/2021.   Specialty: Cardiology Why: at 8:30AM for your cardiology follow up appointment Contact information: [address] [place] [address] [phone]         Ladell Pier, MD. Schedule an appointment as soon as possible for a visit in 1 week(s).   Specialty: Internal Medicine Contact information: [place] [address] [phone]         Leonie Man, MD .   Specialty: Cardiology Contact information: [address] [place] [address] [phone]                Allergies  Allergen Reactions   Ace Inhibitors Rash and Other (See Comments)    Make pt bleed   Aspirin Other (See Comments)    Per patient paperwork: blood clot?  Likely because of chronic DOAC   Hydromorphone Hives and Itching   Vancomycin Itching and Rash   Contrast Media [Iodinated Diagnostic Agents] Hives   Dilaudid [Hydromorphone Hcl] Hives    Consultations: Cardiology   Procedures/Studies: CT CHEST WO CONTRAST  Result Date: 11/23/2020 CLINICAL DATA:  Sudden onset chest pain after a port flush, hypoxia. EXAM: CT CHEST WITHOUT CONTRAST TECHNIQUE: Multidetector CT imaging of the chest was performed following the standard protocol without IV contrast. COMPARISON:  Radiograph 11/22/2020. FINDINGS: Cardiovascular: Limited evaluation the absence of contrast media. Cardiac size is top normal. No pericardial effusion. Coronary artery atherosclerosis. Central pulmonary arteries are borderline enlarged. Atherosclerotic plaque within the normal caliber aorta. No hyperdense mural thickening or plaque displacement. Accessed subcutaneous port catheter is seen with reservoir at the midline chest and a right IJ approach central venous catheter terminating at the level of the superior cavoatrial junction. Notable distension  of the azygos. Additional note made of diffuse pulmonary vascular redistribution and cephalization. Mediastinum/Nodes: No mediastinal fluid or gas. Normal thyroid gland and thoracic inlet. No acute abnormality of the trachea or esophagus. No worrisome mediastinal or axillary adenopathy. Hilar nodal evaluation is limited in the absence of intravenous contrast media. Lungs/Pleura: Pulmonary vascular redistribution. Some mild mosaic attenuation is noted which can be related to hypoventilatory change, small airways disease or air trapping. No focal consolidative process. No pneumothorax or layering effusion. No concerning pulmonary nodules or masses. Upper Abdomen: No acute abnormalities present in the visualized portions of the upper abdomen. Musculoskeletal: No acute osseous abnormality or suspicious osseous lesion. Multilevel degenerative changes are present in the imaged portions of the spine. IMPRESSION: 1. Marked distension of the azygos and vascular redistribution, can be seen in  the setting of volume overload or right heart failure. Alternatively could reflect some degree of vascular collateralization particularly there is venous obstruction elsewhere. 2. Enlarged main pulmonary arteries, can reflect underlying pulmonary artery hypertension. Pulmonary embolism cannot be excluded given that luminal evaluation is precluded in the absence of contrast media. 3. Nonspecific mosaic attenuation, differential could include hypoventilatory change, air trapping or small airways disease. 4.  Aortic Atherosclerosis (ICD10-I70.0). 5. Coronary artery atherosclerosis. Electronically Signed   By: Lovena Le M.D.   On: 11/23/2020 01:13   CT Angio Chest Pulmonary Embolism (PE) W or WO Contrast  Result Date: 11/23/2020 CLINICAL DATA:  50 year old female with chest pain, shortness of breath after Port-A-Cath flushing. EXAM: CT ANGIOGRAPHY CHEST WITH CONTRAST TECHNIQUE: Multidetector CT imaging of the chest was performed using  the standard protocol during bolus administration of intravenous contrast. Multiplanar CT image reconstructions and MIPs were obtained to evaluate the vascular anatomy. CONTRAST:  Forty-five mL Omnipaque 350, intravenous COMPARISON:  11/23/2020, 10/24/2020 FINDINGS: Cardiovascular: Satisfactory opacification of the pulmonary arteries to the segmental level. No evidence of pulmonary embolism. Mild dilation of the main portal vein measuring up to 30 mm. Prominent azygos vein measuring up to 35 mm at its apex. Due to contrast bolus timing, this is a limited evaluation for patency of this vein. Limited evaluation of the IVC appears within normal limits. Mild scattered atherosclerotic calcification of the thoracic aorta. Normal heart size. No pericardial effusion. Mediastinum/Nodes: Again seen is mid sternal Port-A-Cath with right IJ approach, tip terminating in the right atrium. No enlarged mediastinal, hilar, or axillary lymph nodes. Thyroid gland, trachea, and esophagus demonstrate no significant findings. Lungs/Pleura: Low lung volumes. No focal consolidations. No suspicious pulmonary nodules. No pleural effusion or pneumothorax. Upper Abdomen: The visualized upper abdomen is within normal limits. Musculoskeletal: No chest wall abnormality. No acute or significant osseous findings. Review of the MIP images confirms the above findings. IMPRESSION: Vascular: 1. No evidence of pulmonary embolism. 2. Prominent azygos vein as could be seen with sequela of pulmonary hypertension or caval/truncal venous anomaly. Likely clinically insignificant. 3. Unchanged appearance of mid sternal placed right IJ port. Non-Vascular: No acute intrathoracic abnormality. Ruthann Cancer, MD [facility] Electronically Signed   By: Ruthann Cancer MD   On: 11/23/2020 10:37   DG Chest Port 1 View  Result Date: 11/22/2020 CLINICAL DATA:  50 year old female with fever and shortness of  breath. EXAM: PORTABLE CHEST 1 VIEW COMPARISON:  Chest radiograph dated 05/04/2020. FINDINGS: Right-sided Port-A-Cath with tip over the cavoatrial junction in similar position. No focal consolidation, pleural effusion, pneumothorax. There is cardiomegaly with central vascular prominence. Minimal left lung base density, likely atelectasis. No acute osseous pathology. IMPRESSION: Cardiomegaly with mild vascular congestion. No focal consolidation. Electronically Signed   By: Anner Crete M.D.   On: 11/22/2020 20:44   ECHOCARDIOGRAM COMPLETE  Result Date: 11/25/2020    ECHOCARDIOGRAM REPORT   Patient Name:   [name] Date of Exam: 11/25/2020 Medical Rec #:  [id number]               Height:       66.0 in Accession #:    [id number]              Weight:       354.1 lb Date of Birth:  Sep 10, 1970                BSA:          2.549  m Patient Age:    75 years                BP:           129/82 mmHg Patient Gender: F                       HR:           60 bpm. Exam Location:  Inpatient Procedure: 2D Echo Indications:    chest pain  History:        Patient has no prior history of Echocardiogram examinations.                 Signs/Symptoms:Edema and Fever.  Sonographer:    Johny Chess Referring Phys: [id number] Margie Billet  Sonographer Comments: Patient is morbidly obese. Image acquisition challenging due to patient body habitus and Image acquisition challenging due to respiratory motion. IMPRESSIONS  1. Left ventricular ejection fraction, by estimation, is 55 to 60%. The left ventricle has normal function. The left ventricle has no regional wall motion abnormalities. There is mild concentric left ventricular hypertrophy. Left ventricular diastolic parameters are consistent with Grade I diastolic dysfunction (impaired relaxation).  2. Right ventricular systolic function is normal. The right ventricular size is normal. There is normal pulmonary artery systolic pressure.  3. The mitral valve is normal in  structure. No evidence of mitral valve regurgitation. No evidence of mitral stenosis.  4. The aortic valve is tricuspid. Aortic valve regurgitation is not visualized. No aortic stenosis is present.  5. The inferior vena cava is normal in size with <50% respiratory variability, suggesting right atrial pressure of 8 mmHg. FINDINGS  Left Ventricle: Left ventricular ejection fraction, by estimation, is 55 to 60%. The left ventricle has normal function. The left ventricle has no regional wall motion abnormalities. The left ventricular internal cavity size was normal in size. There is  mild concentric left ventricular hypertrophy. Left ventricular diastolic parameters are consistent with Grade I diastolic dysfunction (impaired relaxation). Indeterminate filling pressures. Right Ventricle: The right ventricular size is normal. No increase in right ventricular wall thickness. Right ventricular systolic function is normal. There is normal pulmonary artery systolic pressure. The tricuspid regurgitant velocity is 1.67 m/s, and  with an assumed right atrial pressure of 8 mmHg, the estimated right ventricular systolic pressure is 88.5 mmHg. Left Atrium: Left atrial size was normal in size. Right Atrium: Right atrial size was normal in size. Pericardium: There is no evidence of pericardial effusion. Mitral Valve: The mitral valve is normal in structure. No evidence of mitral valve regurgitation. No evidence of mitral valve stenosis. Tricuspid Valve: The tricuspid valve is normal in structure. Tricuspid valve regurgitation is trivial. No evidence of tricuspid stenosis. Aortic Valve: The aortic valve is tricuspid. Aortic valve regurgitation is not visualized. No aortic stenosis is present. Pulmonic Valve: The pulmonic valve was normal in structure. Pulmonic valve regurgitation is not visualized. No evidence of pulmonic stenosis. Aorta: The aortic root is normal in size and structure. Venous: The inferior vena cava is normal in size  with less than 50% respiratory variability, suggesting right atrial pressure of 8 mmHg. IAS/Shunts: No atrial level shunt detected by color flow Doppler.  LEFT VENTRICLE PLAX 2D LVIDd:         4.70 cm  Diastology LVIDs:         3.30 cm  LV e' medial:    7.07 cm/s LV PW:  1.30 cm  LV E/e' medial:  10.2 LV IVS:        1.20 cm  LV e' lateral:   8.38 cm/s LVOT diam:     1.80 cm  LV E/e' lateral: 8.6 LV SV:         43 LV SV Index:   17 LVOT Area:     2.54 cm  RIGHT VENTRICLE             IVC RV S prime:     17.70 cm/s  IVC diam: 1.90 cm TAPSE (M-mode): 2.1 cm LEFT ATRIUM             Index       RIGHT ATRIUM           Index LA diam:        3.50 cm 1.37 cm/m  RA Area:     10.50 cm LA Vol (A2C):   50.5 ml 19.81 ml/m RA Volume:   20.90 ml  8.20 ml/m LA Vol (A4C):   52.8 ml 20.71 ml/m LA Biplane Vol: 52.5 ml 20.59 ml/m  AORTIC VALVE LVOT Vmax:   89.20 cm/s LVOT Vmean:  58.200 cm/s LVOT VTI:    0.168 m  AORTA Ao Root diam: 2.80 cm Ao Asc diam:  2.80 cm MITRAL VALVE               TRICUSPID VALVE MV Area (PHT): 3.72 cm    TR Peak grad:   11.2 mmHg MV Decel Time: 204 msec    TR Vmax:        167.00 cm/s MV E velocity: 71.80 cm/s MV A velocity: 92.60 cm/s  SHUNTS MV E/A ratio:  0.78        Systemic VTI:  0.17 m                            Systemic Diam: 1.80 cm Skeet Latch MD Electronically signed by Skeet Latch MD Signature Date/Time: 11/25/2020/2:39:24 PM    Final    VAS [place] LOWER EXTREMITY VENOUS (DVT)  Result Date: 11/23/2020  Lower Venous DVT Study Patient Name:  MYCHAELA LENNARTZ  Date of Exam:   11/23/2020 Medical Rec #: [id number]                Accession #:    [id number] Date of Birth: [birth year]                 Patient Gender: F Patient Age:   50Y Exam Location:  [name] Procedure:      VAS [place] LOWER EXTREMITY VENOUS (DVT) Referring Phys: [id number] Yevonne Aline [name] --------------------------------------------------------------------------------  Indications: Swelling, LT leg  ulceration.  Anticoagulation: Eliquis. Limitations: Body habitus, poor ultrasound/tissue interface and bandages. Comparison Study: 06-23-2020 Lower extremity reflux bilateral showed reflux of                   the RT GSV, otherwise negative. Performing Technologist: Darlin Coco RDMS,RVT  Examination Guidelines: A complete evaluation includes B-mode imaging, spectral Doppler, color Doppler, and power Doppler as needed of all accessible portions of each vessel. Bilateral testing is considered an integral part of a complete examination. Limited examinations for reoccurring indications may be performed as noted. The reflux portion of the exam is performed with the patient in reverse Trendelenburg.  +---------+---------------+---------+-----------+----------+---------------+ RIGHT    CompressibilityPhasicitySpontaneityPropertiesThrombus Aging  +---------+---------------+---------+-----------+----------+---------------+ CFV      Full  Yes      Yes                                  +---------+---------------+---------+-----------+----------+---------------+ SFJ      Full                                                         +---------+---------------+---------+-----------+----------+---------------+ FV Prox  Full                                                         +---------+---------------+---------+-----------+----------+---------------+ FV Mid   Full                                                         +---------+---------------+---------+-----------+----------+---------------+ FV DistalFull                                                         +---------+---------------+---------+-----------+----------+---------------+ PFV      Full                                                         +---------+---------------+---------+-----------+----------+---------------+ POP      Full           Yes      Yes                                   +---------+---------------+---------+-----------+----------+---------------+ PTV                     Yes      Yes                  Patent by color +---------+---------------+---------+-----------+----------+---------------+ PERO                    Yes      Yes                  Patent by color +---------+---------------+---------+-----------+----------+---------------+ Gastroc  Full                                                         +---------+---------------+---------+-----------+----------+---------------+   +---------+---------------+---------+-----------+----------+-------------------+ LEFT     CompressibilityPhasicitySpontaneityPropertiesThrombus Aging      +---------+---------------+---------+-----------+----------+-------------------+ CFV      Full  Yes      Yes                                      +---------+---------------+---------+-----------+----------+-------------------+ SFJ      Full                                                             +---------+---------------+---------+-----------+----------+-------------------+ FV Prox  Full                                                             +---------+---------------+---------+-----------+----------+-------------------+ FV Mid   Full                                                             +---------+---------------+---------+-----------+----------+-------------------+ FV Distal               Yes      Yes                  Patent by color     +---------+---------------+---------+-----------+----------+-------------------+ PFV      Full                                                             +---------+---------------+---------+-----------+----------+-------------------+ POP      Full           Yes      Yes                                      +---------+---------------+---------+-----------+----------+-------------------+ PTV                                                    Not visualized                                                            -bandaging          +---------+---------------+---------+-----------+----------+-------------------+ PERO                                                  Not visualized                                                            -  bandaging          +---------+---------------+---------+-----------+----------+-------------------+ Gastroc  Full                                                             +---------+---------------+---------+-----------+----------+-------------------+     Summary: RIGHT: - There is no evidence of deep vein thrombosis in the lower extremity. However, portions of this examination were limited- see technologist comments above.  - No cystic structure found in the popliteal fossa.  LEFT: - There is no evidence of deep vein thrombosis in the lower extremity. However, portions of this examination were limited- see technologist comments above.  - No cystic structure found in the popliteal fossa.  *See table(s) above for measurements and observations. Electronically signed by Servando Snare MD on 11/23/2020 at 2:48:50 PM.    Final       Subjective: Patient seen and examined at bedside.  She feels better and feels okay to go home today.  Denies overnight fever or vomiting.  Still slightly short of breath with some exertion.  Discharge Exam: Vitals:   11/27/20 0630 11/27/20 0724  BP:    Pulse: 63 77  Resp: 12 15  Temp:    SpO2: 96% 98%    General: Pt is alert, awake, not in acute distress.  Currently on room air.  Looks chronically ill. Cardiovascular: rate controlled, S1/S2 + Respiratory: bilateral decreased breath sounds at bases with some scattered crackles Abdominal: Soft, morbidly obese, NT, ND, bowel sounds + Extremities: Trace lower extremity edema present; no cyanosis    The results of significant diagnostics from this hospitalization  (including imaging, microbiology, ancillary and laboratory) are listed below for reference.     Microbiology: Recent Results (from the past 240 hour(s))  Blood culture (routine x 2)     Status: None   Collection Time: 11/22/20  8:25 PM   Specimen: BLOOD  Result Value Ref Range Status   Specimen Description BLOOD LEFT ANTECUBITAL  Final   Special Requests   Final    BOTTLES DRAWN AEROBIC AND ANAEROBIC Blood Culture adequate volume   Culture   Final    NO GROWTH 5 DAYS Performed at [facility], [address], [place], [place] [address]    Report Status 11/27/2020 FINAL  Final  Resp Panel by RT-PCR (Flu A&B, Covid) Nasopharyngeal Swab     Status: None   Collection Time: 11/22/20  8:30 PM   Specimen: Nasopharyngeal Swab; Nasopharyngeal(NP) swabs in vial transport medium  Result Value Ref Range Status   SARS Coronavirus 2 by RT PCR NEGATIVE NEGATIVE Final    Comment: (NOTE) SARS-CoV-2 target nucleic acids are NOT DETECTED.  The SARS-CoV-2 RNA is generally detectable in upper respiratory specimens during the acute phase of infection. The lowest concentration of SARS-CoV-2 viral copies this assay can detect is 138 copies/mL. A negative result does not preclude SARS-Cov-2 infection and should not be used as the sole basis for treatment or other patient management decisions. A negative result may occur with  improper specimen collection/handling, submission of specimen other than nasopharyngeal swab, presence of viral mutation(s) within the areas targeted by this assay, and inadequate number of viral copies(<138 copies/mL). A negative result must be combined with clinical observations, patient history, and epidemiological information. The expected result is Negative.  Fact  Sheet for Patients:  EntrepreneurPulse.com.au  Fact Sheet for Healthcare Providers:  IncredibleEmployment.be  This test is no t yet approved or cleared by the [place] FDA and  has been authorized for detection and/or diagnosis of SARS-CoV-2 by FDA under an Emergency Use Authorization (EUA). This EUA will remain  in effect (meaning this test can be used) for the duration of the COVID-19 declaration under Section 564(b)(1) of the Act, 21 U.S.C.section 360bbb-3(b)(1), unless the authorization is terminated  or revoked sooner.       Influenza A by PCR NEGATIVE NEGATIVE Final   Influenza B by PCR NEGATIVE NEGATIVE Final    Comment: (NOTE) The Xpert Xpress SARS-CoV-2/FLU/RSV plus assay is intended as an aid in the diagnosis of influenza from Nasopharyngeal swab specimens and should not be used as a sole basis for treatment. Nasal washings and aspirates are unacceptable for Xpert Xpress SARS-CoV-2/FLU/RSV testing.  Fact Sheet for Patients: EntrepreneurPulse.com.au  Fact Sheet for Healthcare Providers: IncredibleEmployment.be  This test is not yet approved or cleared by the [place] FDA and has been authorized for detection and/or diagnosis of SARS-CoV-2 by FDA under an Emergency Use Authorization (EUA). This EUA will remain in effect (meaning this test can be used) for the duration of the COVID-19 declaration under Section 564(b)(1) of the Act, 21 U.S.C. section 360bbb-3(b)(1), unless the authorization is terminated or revoked.  Performed at [facility], [place] [address] [place], [place] [address]   Blood culture (routine x 2)     Status: None   Collection Time: 11/22/20  8:32 PM   Specimen: BLOOD  Result Value Ref Range Status   Specimen Description BLOOD LEFT ANTECUBITAL  Final   Special Requests   Final    BOTTLES DRAWN AEROBIC AND ANAEROBIC Blood Culture adequate volume   Culture   Final    NO GROWTH 5 DAYS Performed at [facility], [place] [address] [place], [place] [address]    Report Status 11/27/2020 FINAL  Final     Labs: BNP (last 3 results) Recent Labs     01/20/20 1935 11/22/20 2338  BNP 143.5* 15.1   Basic Metabolic Panel: Recent Labs  Lab 11/22/20 2021 11/22/20 2051 11/23/20 0537 11/25/20 0425 11/27/20 0150  NA 135 140 136 134* 131*  K 3.5 3.4* 3.8 3.8 4.0  CL 102  --  103 94* 90*  CO2 26  --  26 32 32  GLUCOSE 155*  --  210* 205* 263*  BUN 10  --  13 25* 33*  CREATININE 0.84  --  0.91 0.84 0.96  CALCIUM 8.9  --  9.2 9.2 9.3  MG  --   --   --  2.0 2.2   Liver Function Tests: Recent Labs  Lab 11/22/20 2021  AST 16  ALT 11  ALKPHOS 69  BILITOT 0.9  PROT 7.1  ALBUMIN 3.3*   No results for input(s): LIPASE, AMYLASE in the last 168 hours. No results for input(s): AMMONIA in the last 168 hours. CBC: Recent Labs  Lab 11/22/20 2021 11/22/20 2051 11/23/20 0537 11/25/20 0425  WBC 6.4  --  9.5 11.1*  NEUTROABS  --   --   --  10.1*  HGB 10.2* 10.5* 10.8* 10.4*  HCT 35.0* 31.0* 36.2 34.1*  MCV 94.1  --  93.5 90.7  PLT 117*  --  129* 160   Cardiac Enzymes: No results for input(s): CKTOTAL, CKMB, CKMBINDEX, TROPONINI in the last 168 hours. BNP: Invalid input(s):  POCBNP CBG: Recent Labs  Lab 11/26/20 0727 11/26/20 1121 11/26/20 1553 11/26/20 2140 11/27/20 0727  GLUCAP 295* 383* 169* 243* 169*   D-Dimer No results for input(s): DDIMER in the last 72 hours. Hgb A1c No results for input(s): HGBA1C in the last 72 hours. Lipid Profile No results for input(s): CHOL, HDL, LDLCALC, TRIG, CHOLHDL, LDLDIRECT in the last 72 hours. Thyroid function studies No results for input(s): TSH, T4TOTAL, T3FREE, THYROIDAB in the last 72 hours.  Invalid input(s): FREET3 Anemia work up No results for input(s): VITAMINB12, FOLATE, FERRITIN, TIBC, IRON, RETICCTPCT in the last 72 hours. Urinalysis    Component Value Date/Time   COLORURINE YELLOW 11/22/2020 2210   APPEARANCEUR CLEAR 11/22/2020 2210   LABSPEC 1.025 11/22/2020 2210   PHURINE 5.0 11/22/2020 2210   GLUCOSEU NEGATIVE 11/22/2020 2210   HGBUR NEGATIVE 11/22/2020  2210   BILIRUBINUR NEGATIVE 11/22/2020 2210   KETONESUR NEGATIVE 11/22/2020 2210   PROTEINUR NEGATIVE 11/22/2020 2210   NITRITE NEGATIVE 11/22/2020 2210   LEUKOCYTESUR NEGATIVE 11/22/2020 2210   Sepsis Labs Invalid input(s): PROCALCITONIN,  WBC,  LACTICIDVEN Microbiology Recent Results (from the past 240 hour(s))  Blood culture (routine x 2)     Status: None   Collection Time: 11/22/20  8:25 PM   Specimen: BLOOD  Result Value Ref Range Status   Specimen Description BLOOD LEFT ANTECUBITAL  Final   Special Requests   Final    BOTTLES DRAWN AEROBIC AND ANAEROBIC Blood Culture adequate volume   Culture   Final    NO GROWTH 5 DAYS Performed at [facility], [place] [address] [place], [place] [address]    Report Status 11/27/2020 FINAL  Final  Resp Panel by RT-PCR (Flu A&B, Covid) Nasopharyngeal Swab     Status: None   Collection Time: 11/22/20  8:30 PM   Specimen: Nasopharyngeal Swab; Nasopharyngeal(NP) swabs in vial transport medium  Result Value Ref Range Status   SARS Coronavirus 2 by RT PCR NEGATIVE NEGATIVE Final    Comment: (NOTE) SARS-CoV-2 target nucleic acids are NOT DETECTED.  The SARS-CoV-2 RNA is generally detectable in upper respiratory specimens during the acute phase of infection. The lowest concentration of SARS-CoV-2 viral copies this assay can detect is 138 copies/mL. A negative result does not preclude SARS-Cov-2 infection and should not be used as the sole basis for treatment or other patient management decisions. A negative result may occur with  improper specimen collection/handling, submission of specimen other than nasopharyngeal swab, presence of viral mutation(s) within the areas targeted by this assay, and inadequate number of viral copies(<138 copies/mL). A negative result must be combined with clinical observations, patient history, and epidemiological information. The expected result is Negative.  Fact Sheet for Patients:   EntrepreneurPulse.com.au  Fact Sheet for Healthcare Providers:  IncredibleEmployment.be  This test is no t yet approved or cleared by the [place] FDA and  has been authorized for detection and/or diagnosis of SARS-CoV-2 by FDA under an Emergency Use Authorization (EUA). This EUA will remain  in effect (meaning this test can be used) for the duration of the COVID-19 declaration under Section 564(b)(1) of the Act, 21 U.S.C.section 360bbb-3(b)(1), unless the authorization is terminated  or revoked sooner.       Influenza A by PCR NEGATIVE NEGATIVE Final   Influenza B by PCR NEGATIVE NEGATIVE Final    Comment: (NOTE) The Xpert Xpress SARS-CoV-2/FLU/RSV plus assay is intended as an aid in the diagnosis of influenza from Nasopharyngeal swab specimens and should not  be used as a sole basis for treatment. Nasal washings and aspirates are unacceptable for Xpert Xpress SARS-CoV-2/FLU/RSV testing.  Fact Sheet for Patients: EntrepreneurPulse.com.au  Fact Sheet for Healthcare Providers: IncredibleEmployment.be  This test is not yet approved or cleared by the [place] FDA and has been authorized for detection and/or diagnosis of SARS-CoV-2 by FDA under an Emergency Use Authorization (EUA). This EUA will remain in effect (meaning this test can be used) for the duration of the COVID-19 declaration under Section 564(b)(1) of the Act, 21 U.S.C. section 360bbb-3(b)(1), unless the authorization is terminated or revoked.  Performed at [facility], [place] [address] [place], [place] [address]   Blood culture (routine x 2)     Status: None   Collection Time: 11/22/20  8:32 PM   Specimen: BLOOD  Result Value Ref Range Status   Specimen Description BLOOD LEFT ANTECUBITAL  Final   Special Requests   Final    BOTTLES DRAWN AEROBIC AND ANAEROBIC Blood Culture adequate volume   Culture   Final    NO GROWTH 5  DAYS Performed at [facility], [place] [address], [place], [place] [address]    Report Status 11/27/2020 FINAL  Final     Time coordinating discharge: 35 minutes  SIGNED:   Aline August, MD  [facility] 11/27/2020, 11:05 AM

## 2020-11-27 NOTE — Progress Notes (Signed)
Physical Therapy Treatment Patient Details Name: [name] MRN: [id number] DOB: 1970/05/03 Today's Date: 11/27/2020    History of Present Illness 50 y.o. female [facility] with SOB, fever, hypoxia. PMH includes HFpEF, DVT on anticoagulation with Eliquis, HTN, asthma, iron deficiency anemia.    PT Comments    Patient progressing well with ambulation with sats 97% on RA. All goals met and pt discharged from PT. Patient in agreement.    Follow Up Recommendations  No PT follow up     Equipment Recommendations  None recommended by PT    Recommendations for Other Services       Precautions / Restrictions Precautions Precautions: Fall Restrictions Weight Bearing Restrictions: No    Mobility  Bed Mobility Overal bed mobility: Modified Independent             General bed mobility comments: increased time, HOB elevated, use of rails    Transfers Overall transfer level: Modified independent Equipment used: Rolling walker (2 wheeled) Transfers: Sit to/from Stand Sit to Stand: Modified independent (Device/Increase time)            Ambulation/Gait Ambulation/Gait assistance: Modified independent (Device/Increase time) Gait Distance (Feet): 350 Feet Assistive device: Rolling walker (2 wheeled) Gait Pattern/deviations: Step-through pattern;Trunk flexed Gait velocity: reduced   General Gait Details: pt with slowed step-through gait, increased trunk flexion with anterior displacement of RW due to body habitus   Stairs             Wheelchair Mobility    Modified Rankin (Stroke Patients Only)       Balance Overall balance assessment: Needs assistance Sitting-balance support: No upper extremity supported;Feet supported Sitting balance-Leahy Scale: Good     Standing balance support: No upper extremity supported;During functional activity Standing balance-Leahy Scale: Good Standing balance comment: able to wash hands at sink  without UE support                            Cognition Arousal/Alertness: Awake/alert Behavior During Therapy: WFL for tasks assessed/performed Overall Cognitive Status: Within Functional Limits for tasks assessed                                        Exercises      General Comments General comments (skin integrity, edema, etc.): on RA with sats 97% with ambulation      Pertinent Vitals/Pain Pain Assessment: No/denies pain    Home Living                      Prior Function            PT Goals (current goals can now be found in the care plan section) Acute Rehab PT Goals Patient Stated Goal: to return to independence and go home PT Goal Formulation: With patient Time For Goal Achievement: 12/07/20 Potential to Achieve Goals: Good Progress towards PT goals: Goals met/education completed, patient discharged from PT    Frequency    Min 3X/week      PT Plan Current plan remains appropriate    Co-evaluation              AM-PAC PT "6 Clicks" Mobility   Outcome Measure  Help needed turning from your back to your side while in a flat bed without using bedrails?: None Help needed moving from  lying on your back to sitting on the side of a flat bed without using bedrails?: None Help needed moving to and from a bed to a chair (including a wheelchair)?: None Help needed standing up from a chair using your arms (e.g., wheelchair or bedside chair)?: None Help needed to walk in hospital room?: None Help needed climbing 3-5 steps with a railing? : A Little 6 Click Score: 23    End of Session   Activity Tolerance: Patient tolerated treatment well Patient left: in chair;with call bell/phone within reach Nurse Communication: Mobility status PT Visit Diagnosis: Other abnormalities of gait and mobility (R26.89)   PT Discharge Note  Patient is being discharged from PT services secondary to:  Goals met and no further therapy needs  identified.  Please see latest Therapy Progress Note for current level of functioning and progress toward goals.  Progress and discharge plan and discussed with patient/caregiver and they  Agree   Time: 1855-0158 PT Time Calculation (min) (ACUTE ONLY): 19 min  Charges:  $Gait Training: 8-22 mins                     [name], PT Pager [phone]    [name] 11/27/2020, 11:09 AM

## 2020-11-27 NOTE — Progress Notes (Signed)
Patient requesting a ride home. CSW will arrange ride through [employer].   Gilmore Laroche, MSW, [facility]

## 2020-11-28 ENCOUNTER — Telehealth: Payer: Self-pay

## 2020-11-28 NOTE — Telephone Encounter (Signed)
Transition Care Management Unsuccessful Follow-up Telephone Call  Date of discharge and from where:  11/27/2020-[place]  Attempts:  1st Attempt  Reason for unsuccessful TCM follow-up call:  Left voice message

## 2020-11-29 ENCOUNTER — Other Ambulatory Visit: Payer: Self-pay

## 2020-11-29 ENCOUNTER — Telehealth: Payer: Self-pay

## 2020-11-29 ENCOUNTER — Ambulatory Visit: Payer: Medicaid Other | Attending: Internal Medicine | Admitting: Physician Assistant

## 2020-11-29 VITALS — BP 111/77 | HR 75 | Resp 16 | Wt 339.4 lb

## 2020-11-29 DIAGNOSIS — E1165 Type 2 diabetes mellitus with hyperglycemia: Secondary | ICD-10-CM | POA: Diagnosis not present

## 2020-11-29 DIAGNOSIS — E871 Hypo-osmolality and hyponatremia: Secondary | ICD-10-CM

## 2020-11-29 DIAGNOSIS — I10 Essential (primary) hypertension: Secondary | ICD-10-CM | POA: Diagnosis not present

## 2020-11-29 DIAGNOSIS — I5032 Chronic diastolic (congestive) heart failure: Secondary | ICD-10-CM

## 2020-11-29 DIAGNOSIS — M546 Pain in thoracic spine: Secondary | ICD-10-CM | POA: Diagnosis not present

## 2020-11-29 DIAGNOSIS — L8992 Pressure ulcer of unspecified site, stage 2: Secondary | ICD-10-CM | POA: Diagnosis not present

## 2020-11-29 DIAGNOSIS — I2510 Atherosclerotic heart disease of native coronary artery without angina pectoris: Secondary | ICD-10-CM

## 2020-11-29 DIAGNOSIS — Z09 Encounter for follow-up examination after completed treatment for conditions other than malignant neoplasm: Secondary | ICD-10-CM

## 2020-11-29 DIAGNOSIS — G8929 Other chronic pain: Secondary | ICD-10-CM | POA: Diagnosis not present

## 2020-11-29 DIAGNOSIS — Z8709 Personal history of other diseases of the respiratory system: Secondary | ICD-10-CM | POA: Diagnosis not present

## 2020-11-29 DIAGNOSIS — Z86718 Personal history of other venous thrombosis and embolism: Secondary | ICD-10-CM

## 2020-11-29 LAB — GLUCOSE, POCT (MANUAL RESULT ENTRY): POC Glucose: 200 mg/dl — AB (ref 70–99)

## 2020-11-29 MED ORDER — METHOCARBAMOL 500 MG PO TABS: ORAL_TABLET | ORAL | 0 refills | Status: DC

## 2020-11-29 MED ORDER — ACCU-CHEK SOFTCLIX LANCETS MISC: 12 refills | Status: DC

## 2020-11-29 MED ORDER — VALSARTAN 80 MG PO TABS: 80.0000 mg | ORAL_TABLET | Freq: Every day | ORAL | 3 refills | Status: DC

## 2020-11-29 MED ORDER — GLUCOSE BLOOD VI STRP: ORAL_STRIP | 12 refills | Status: DC

## 2020-11-29 MED ORDER — DOXYCYCLINE HYCLATE 100 MG PO TABS
100.0000 mg | ORAL_TABLET | Freq: Two times a day (BID) | ORAL | 0 refills | Status: DC
Start: 2020-11-29 — End: 2021-06-01

## 2020-11-29 MED ORDER — ATORVASTATIN CALCIUM 40 MG PO TABS: 40.0000 mg | ORAL_TABLET | Freq: Every day | ORAL | 1 refills | Status: DC

## 2020-11-29 MED ORDER — DAPAGLIFLOZIN PROPANEDIOL 10 MG PO TABS: 10.0000 mg | ORAL_TABLET | Freq: Every day | ORAL | 0 refills | Status: DC

## 2020-11-29 MED ORDER — AMLODIPINE BESYLATE 5 MG PO TABS: 2.5000 mg | ORAL_TABLET | Freq: Every day | ORAL | 2 refills | Status: DC

## 2020-11-29 MED ORDER — ACCU-CHEK GUIDE W/DEVICE KIT: 1.0000 | PACK | Freq: Two times a day (BID) | 0 refills | Status: DC

## 2020-11-29 MED ORDER — APIXABAN 5 MG PO TABS: 5.0000 mg | ORAL_TABLET | Freq: Two times a day (BID) | ORAL | 3 refills | Status: DC

## 2020-11-29 MED ORDER — GABAPENTIN 100 MG PO CAPS: 100.0000 mg | ORAL_CAPSULE | Freq: Three times a day (TID) | ORAL | 2 refills | Status: DC | PRN

## 2020-11-29 MED ORDER — FUROSEMIDE 80 MG PO TABS: 80.0000 mg | ORAL_TABLET | Freq: Every day | ORAL | 0 refills | Status: DC

## 2020-11-29 MED ORDER — MOMETASONE FURO-FORMOTEROL FUM 200-5 MCG/ACT IN AERO: 2.0000 | INHALATION_SPRAY | Freq: Two times a day (BID) | RESPIRATORY_TRACT | 2 refills | Status: DC

## 2020-11-29 MED ORDER — TRAMADOL HCL 50 MG PO TABS: 50.0000 mg | ORAL_TABLET | Freq: Four times a day (QID) | ORAL | 0 refills | Status: DC | PRN

## 2020-11-29 NOTE — Telephone Encounter (Signed)
Transition Care Management Follow-up Telephone Call Date of discharge and from where: 11/27/2020-[place] How have you been since you were released from the hospital? Patient stated she is doing fine. Any questions or concerns? No  Items Reviewed: Did the pt receive and understand the discharge instructions provided? Yes  Medications obtained and verified? Yes  Other? No  Any new allergies since your discharge? No  Dietary orders reviewed? N/A Do you have support at home? Yes   Home Care and Equipment/Supplies: Were home health services ordered? not applicable If so, what is the name of the agency? N/A  Has the agency set up a time to come to the patient's home? not applicable Were any new equipment or medical supplies ordered?  No What is the name of the medical supply agency? N/A Were you able to get the supplies/equipment? not applicable Do you have any questions related to the use of the equipment or supplies? No  Functional Questionnaire: (I = Independent and D = Dependent) ADLs: I  Bathing/Dressing- I  Meal Prep- I  Eating- I  Maintaining continence- I  Transferring/Ambulation- I  Managing Meds- I  Follow up appointments reviewed:  PCP Hospital f/u appt confirmed? Yes  Scheduled to see Lavonda Jumbo on 11/29/2020 @ 2:30 PM. [facility] f/u appt confirmed? Yes  Scheduled to see Hoffman,Jessica Ratliff,DO on 11/30/2020 @ 2:45 PM. Are transportation arrangements needed? No  If their condition worsens, is the pt aware to call PCP or go to the Emergency Dept.? Yes Was the patient provided with contact information for the PCP's office or ED? Yes Was to pt encouraged to call back with questions or concerns? Yes

## 2020-11-29 NOTE — Telephone Encounter (Signed)
Transition Care Management Follow-up Telephone Call Date of discharge and from where: 11/27/2020.  Patient had appointment today with Freeman Caldron, PA - at [facility].

## 2020-11-29 NOTE — Progress Notes (Signed)
Patient ID: [name], female   DOB: 03-19-1971, 50 y.o.   MRN: [id number]     [name], is a 50 y.o. female  HYQ:[id number]  [id number]:[id number]  DOB - [birth year]  Subjective:  Chief Complaint and HPI: [name] is a 51 y.o. female here today for a follow up visit After hospitalization 7/27-11/27/2020 for FUO and SOB.  She is feeling some better.  She developed pressure sores on her buttocks during her hospitalization.  She has an appt with wound clinic tomorrow.  She also has f/up scheduled with cardiology, pulmonology, vascular, and med onc.    From discharge summary: Brief/Interim Summary: 50 y.o. female with medical history significant for HFpEF, DVT on anticoagulation with Eliquis, HTN, asthma, iron deficiency anemia who presented with worsening shortness of breath.  EMS had to put her on nonrebreather because her saturations were 58% on room air.  On presentation, she was febrile to 103F.  Chest x-ray was negative for infiltrates; UA was negative for UTI.  WBCs were normal with normal renal function.  She was given IV antibiotics empirically.  Given the hospitalization, her condition gradually improved.  She became afebrile.  Cultures were negative.  Cefepime discontinued on 11/25/2020.  Cardiology was consulted for CHF exacerbation and she was treated with IV Lasix.  Solu-Medrol was also used for asthma exacerbation and gradually tapered.  Her respiratory status has greatly improved.  Currently afebrile.  Cardiology has cleared the patient for discharge today on oral Lasix.  Outpatient follow-up with PCP/cardiology and would benefit from follow-up with pulmonary as well.   Discharge Diagnoses:  Fever -Questionable cause.  Presented with temperature of 103 F.  Chest x-ray negative for infiltrates; UA negative for UTI.  COVID-19 and influenza tests were negative.  CT chest without contrast showed findings of possible right heart failure/volume overload; no evidence of  pulmonary embolism -Off IV fluids.  Cultures negative so far -Currently afebrile.  Off cefepime since 11/25/2020.   Asthma exacerbation -Improving.  Treated with tapering doses of IV Solu-Medrol along with Dulera and nebs. -Continue prednisone 40 mg daily for 7 days upon discharge.  Continue as needed albuterol.  Continue Dulera.  Outpatient evaluation and follow-up by pulmonary  Acute hypoxic respiratory failure -Possibly from asthma exacerbation and CHF exacerbation.  Presented on a nonrebreather.  Respiratory status much improved.  Currently on room air.   Acute on chronic diastolic heart failure Chest pain -Patient takes Lasix 80 mg p.o. daily at home.  CT chest showed findings of possible volume overload -Continue diet and fluid restriction. -Continue Coreg, irbesartan and spironolactone.  Outpatient follow-up with cardiology -Currently on IV Lasix 80 mg twice a day -Chest pain improving.  Troponins negative.  Cardiology following and 2D echo showed EF of 55 to 60% with grade 1 diastolic dysfunction. -Cardiology has cleared the patient for discharge today on Lasix 80 mg p.o. daily and spironolactone.  Outpatient follow-up with cardiology.   Bilateral lower extremity edema -Patient has chronic bilateral lower extremity edema and has Unna boots.  Lower extremity duplex ultrasound was negative for DVT.   History of DVT on Eliquis -Continue Eliquis   Hypokalemia -Improved  Thrombocytopenia -Questionable cause.  No signs of bleeding.  Resolved   Leukocytosis -No new labs today.  Iron deficiency anemia/anemia of chronic disease -Receives iron infusions at cancer center.  Outpatient follow-up.  Hemoglobin stable recently.   Diabetes mellitus type 2 with hyperglycemia -A1c 6.5.  Carb modified diet.  Wilder Glade has been started by cardiology which  will be continued.  Outpatient follow-up with PCP regarding need for other meds.  ED/Hospital notes reviewed.    ROS:   Constitutional:   No f/c, No night sweats, No unexplained weight loss. EENT:  No vision changes, No blurry vision, No hearing changes. No mouth, throat, or ear problems.  Respiratory: No cough, inmproving Cardiac: No CP, no palpitations GI:  No abd pain, No N/V/D. GU: No Urinary s/sx Musculoskeletal: No joint pain Neuro: No headache, no dizziness, no motor weakness.  Skin: No rash Endocrine:  No polydipsia. No polyuria.  Psych: Denies SI/HI  No problems updated.  ALLERGIES: Allergies  Allergen Reactions   Ace Inhibitors Rash and Other (See Comments)    Make pt bleed   Aspirin Other (See Comments)    Per patient paperwork: blood clot?  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   Morbid obesity due to excess calories (HCC)    Osteoarthritis of left knee    Prediabetes    Small bowel obstruction ([place])    as a child   Speech impediment    Stutter / stammer    MEDICATIONS AT HOME: Prior to Admission medications   Medication Sig Start Date End Date Taking? Authorizing Provider   Accu-Chek Softclix Lancets lancets Use as instructed 11/29/20  Yes Argentina Donovan, PA-C  Blood Glucose Monitoring Suppl (ACCU-CHEK GUIDE) w/Device KIT 1 each by Does not apply route 2 (two) times daily. 11/29/20  Yes Freeman Caldron M, PA-C  doxycycline (VIBRA-TABS) 100 MG tablet Take 1 tablet (100 mg total) by mouth 2 (two) times daily. 11/29/20  Yes Freeman Caldron M, PA-C  glucose blood test strip Use as instructed 11/29/20  Yes Sofia Vanmeter M, PA-C  acetaminophen (TYLENOL) 500 MG tablet Take 1 tablet (500 mg total) by mouth every 6 (six) hours as needed. 04/19/20   Ladell Pier, MD  albuterol [facility] HFA) 108 (90 Base) MCG/ACT inhaler INHALE TWO PUFFS BY MOUTH EVERY 6 HOURS AS NEEDED FOR WHEEZING OR SHORTNESS OF BREATH 10/05/20   Ladell Pier, MD  amLODipine (NORVASC) 5 MG tablet Take 0.5 tablets (2.5 mg total) by mouth daily. 11/29/20   Argentina Donovan, PA-C  apixaban (ELIQUIS) 5 MG TABS tablet Take 1 tablet (5 mg total) by mouth 2 (two) times daily. 11/29/20   Argentina Donovan, PA-C  atorvastatin (LIPITOR) 40 MG tablet Take 1 tablet (40 mg total) by mouth daily. 11/29/20   Argentina Donovan, PA-C  Carboxymethylcellulose Sodium (EYE DROPS OP) Place 1  drop into both eyes daily as needed (dry eyes).    [provider]  carvedilol (COREG) 25 MG tablet TAKE ONE TABLET BY MOUTH TWICE A DAY WITH A MEAL 10/26/20   Ladell Pier, MD  cholecalciferol (VITAMIN D3) 25 MCG (1000 UNIT) tablet Take 1 tablet (1,000 Units total) by mouth daily. 04/07/20   Ladell Pier, MD  dapagliflozin propanediol (FARXIGA) 10 MG TABS tablet Take 1 tablet (10 mg total) by mouth daily. 11/29/20   Argentina Donovan, PA-C  furosemide (LASIX) 80 MG tablet Take 1 tablet (80 mg total) by mouth daily. 11/29/20   Argentina Donovan, PA-C  gabapentin (NEURONTIN) 100 MG capsule Take 1 capsule (100 mg total) by mouth 3 (three) times daily as needed (pain). 11/29/20   Argentina Donovan, PA-C  hydrocortisone cream 1 %  Apply 1 application topically daily as needed for itching.    [provider]  methocarbamol (ROBAXIN) 500 MG tablet TAKE ONE TABLET BY MOUTH DAILY AS NEEDED FOR MUSCLE SPASMS 11/29/20   Taunya Goral, Levada Dy M, PA-C  mometasone-formoterol (DULERA) 200-5 MCG/ACT AERO Inhale 2 puffs into the lungs 2 (two) times daily. 11/29/20   Argentina Donovan, PA-C  nitroGLYCERIN (NITROSTAT) 0.4 MG SL tablet Place 1 tablet (0.4 mg total) under the tongue every 5 (five) minutes as needed for chest pain. 11/27/20   Aline August, MD  omeprazole (PRILOSEC) 20 MG capsule Take 20 mg by mouth daily as needed (acid reflux). 12/15/19   [provider]  PARoxetine (PAXIL) 10 MG tablet TAKE ONE TABLET BY MOUTH DAILY 11/08/20   Ladell Pier, MD  predniSONE (DELTASONE) 20 MG tablet Take 2 tablets (40 mg total) by mouth daily with breakfast for 7 days. 11/27/20 12/04/20  Aline August, MD  spironolactone (ALDACTONE) 25 MG tablet Take 1 tablet (25 mg total) by mouth daily. 10/26/20   Ladell Pier, MD  traMADol (ULTRAM) 50 MG tablet Take 1 tablet (50 mg total) by mouth every 6 (six) hours as needed for moderate pain. 11/29/20   Argentina Donovan, PA-C  valsartan (DIOVAN) 80 MG tablet Take 1 tablet (80 mg total) by mouth daily. 11/29/20   Argentina Donovan, PA-C  vitamin B-12 (CYANOCOBALAMIN) 1000 MCG tablet Take 1,000 mcg by mouth daily.    [provider]     Objective:  EXAM:   Vitals:   11/29/20 1419  BP: 111/77  Pulse: 75  Resp: 16  SpO2: 95%  Weight: (!) 339 lb 6.4 oz (154 kg)   Weight was 353 2 days ago on discharge General appearance : A&OX3. NAD. Non-toxic-appearing; morbidly obese HEENT: Atraumatic and Normocephalic.   Neck: supple, no JVD. No cervical lymphadenopathy. No thyromegaly Chest/Lungs:  Breathing-non-labored, Good air entry bilaterally, breath sounds normal without rales, rhonchi, or wheezing  CVS: S1 S2 regular, no murmurs, gallops, rubs  Extremities: Bilateral Lower Ext  shows 1+ edema, both legs are warm to touch with = pulse throughout Neurology:  CN II-XII grossly intact, Non focal.   Psych:  TP linear. J/I WNL. Normal speech. Appropriate eye contact and affect.  Skin:  No Rash  Data Review Lab Results  Component Value Date   HGBA1C 6.5 (H) 11/24/2020   HGBA1C 5.7 04/07/2020     Assessment & Plan   1. Type 2 diabetes mellitus with hyperglycemia, unspecified whether long term insulin use (HCC) Start farxiga - Glucose (CBG) - dapagliflozin propanediol (FARXIGA) 10 MG TABS tablet; Take 1 tablet (10 mg total) by mouth  daily.  Dispense: 30 tablet; Refill: 0 - Accu-Chek Softclix Lancets lancets; Use as instructed  Dispense: 100 each; Refill: 12 - glucose blood test strip; Use as instructed  Dispense: 100 each; Refill: 12 - Blood Glucose Monitoring Suppl (ACCU-CHEK GUIDE) w/Device KIT; 1 each by Does not apply route 2 (two) times daily.  Dispense: 1 kit; Refill: 0 - Comprehensive metabolic panel  2. Diastolic CHF, chronic (HCC) Weights down today - dapagliflozin propanediol (FARXIGA) 10 MG TABS tablet; Take 1 tablet (10 mg total) by mouth daily.  Dispense: 30 tablet; Refill: 0 - furosemide (LASIX) 80 MG tablet; Take 1 tablet (80 mg total) by mouth daily.  Dispense: 30 tablet; Refill: 0  3. Essential hypertension controlled - furosemide (LASIX) 80 MG tablet; Take 1 tablet (80 mg total) by mouth daily.  Dispense: 30 tablet; Refill: 0 - valsartan (DIOVAN) 80 MG tablet; Take 1 tablet (80 mg total) by mouth daily.  Dispense: 90 tablet; Refill: 3 - amLODipine (NORVASC) 5 MG tablet; Take 0.5 tablets (2.5 mg total) by mouth daily.  Dispense: 30 tablet; Refill: 2 - Comprehensive metabolic panel  4. Chronic midline thoracic back pain - methocarbamol (ROBAXIN) 500 MG tablet; TAKE ONE TABLET BY MOUTH DAILY AS NEEDED FOR MUSCLE SPASMS  Dispense: 60 tablet; Refill: 0  5. Coronary artery disease involving native coronary artery of native heart without angina  pectoris - atorvastatin (LIPITOR) 40 MG tablet; Take 1 tablet (40 mg total) by mouth daily.  Dispense: 90 tablet; Refill: 1  6. History of DVT (deep vein thrombosis) - apixaban (ELIQUIS) 5 MG TABS tablet; Take 1 tablet (5 mg total) by mouth 2 (two) times daily.  Dispense: 60 tablet; Refill: 3  7. Hyponatremia - Comprehensive metabolic panel  8. Hospital discharge follow-up Discharged 2 days ago  9. History of asthma - mometasone-formoterol (DULERA) 200-5 MCG/ACT AERO; Inhale 2 puffs into the lungs 2 (two) times daily.  Dispense: 1 each; Refill: 2  10. Pressure injury, stage 2, unspecified location ([place]) Sees wound care tomorrow-will cover for infection.  No surrounding induration today.  Bandages left in place to avoid further tissue injury and being [facility] by [facility] tomorrow - traMADol (ULTRAM) 50 MG tablet; Take 1 tablet (50 mg total) by mouth every 6 (six) hours as needed for moderate pain.  Dispense: 14 tablet; Refill: 0 - gabapentin (NEURONTIN) 100 MG capsule; Take 1 capsule (100 mg total) by mouth 3 (three) times daily as needed (pain).  Dispense: 90 capsule; Refill: 2 - CBC with Differential/Platelet - doxycycline (VIBRA-TABS) 100 MG tablet; Take 1 tablet (100 mg total) by mouth 2 (two) times daily.  Dispense: 20 tablet; Refill: 0   Patient have been counseled extensively about nutrition and exercise  Return in about 3 months (around 03/01/2021) for PCP/ chonic conditions.  The patient was given clear instructions to go to ER or return to medical center if symptoms don't improve, worsen or new problems develop. The patient verbalized understanding. The patient was told to call to get lab results if they haven't heard anything in the next week.     Freeman Caldron, PA-C [facility] and [place], [place]   11/29/2020, 2:49 PM

## 2020-11-30 ENCOUNTER — Encounter (HOSPITAL_BASED_OUTPATIENT_CLINIC_OR_DEPARTMENT_OTHER): Payer: Medicaid Other | Attending: Internal Medicine | Admitting: Internal Medicine

## 2020-11-30 DIAGNOSIS — I872 Venous insufficiency (chronic) (peripheral): Secondary | ICD-10-CM | POA: Diagnosis not present

## 2020-11-30 DIAGNOSIS — I509 Heart failure, unspecified: Secondary | ICD-10-CM | POA: Diagnosis not present

## 2020-11-30 DIAGNOSIS — Z8249 Family history of ischemic heart disease and other diseases of the circulatory system: Secondary | ICD-10-CM | POA: Diagnosis not present

## 2020-11-30 DIAGNOSIS — L97829 Non-pressure chronic ulcer of other part of left lower leg with unspecified severity: Secondary | ICD-10-CM | POA: Insufficient documentation

## 2020-11-30 DIAGNOSIS — I890 Lymphedema, not elsewhere classified: Secondary | ICD-10-CM | POA: Diagnosis not present

## 2020-11-30 DIAGNOSIS — I2510 Atherosclerotic heart disease of native coronary artery without angina pectoris: Secondary | ICD-10-CM | POA: Diagnosis not present

## 2020-11-30 DIAGNOSIS — Z86718 Personal history of other venous thrombosis and embolism: Secondary | ICD-10-CM | POA: Insufficient documentation

## 2020-11-30 DIAGNOSIS — I110 Hypertensive heart disease with heart failure: Secondary | ICD-10-CM | POA: Diagnosis not present

## 2020-11-30 DIAGNOSIS — Z87891 Personal history of nicotine dependence: Secondary | ICD-10-CM | POA: Insufficient documentation

## 2020-11-30 DIAGNOSIS — G4730 Sleep apnea, unspecified: Secondary | ICD-10-CM | POA: Insufficient documentation

## 2020-11-30 LAB — COMPREHENSIVE METABOLIC PANEL
ALT: 9 IU/L (ref 0–32)
AST: 10 IU/L (ref 0–40)
Albumin/Globulin Ratio: 1.3 (ref 1.2–2.2)
Albumin: 4 g/dL (ref 3.8–4.9)
Alkaline Phosphatase: 77 IU/L (ref 44–121)
BUN/Creatinine Ratio: 16 (ref 9–23)
BUN: 12 mg/dL (ref 6–24)
Bilirubin Total: 0.7 mg/dL (ref 0.0–1.2)
CO2: 29 mmol/L (ref 20–29)
Calcium: 9.4 mg/dL (ref 8.7–10.2)
Chloride: 96 mmol/L (ref 96–106)
Creatinine, Ser: 0.77 mg/dL (ref 0.57–1.00)
Globulin, Total: 3 g/dL (ref 1.5–4.5)
Glucose: 208 mg/dL — ABNORMAL HIGH (ref 65–99)
Potassium: 4.6 mmol/L (ref 3.5–5.2)
Sodium: 138 mmol/L (ref 134–144)
Total Protein: 7 g/dL (ref 6.0–8.5)
eGFR: 93 mL/min/{1.73_m2} (ref >59)

## 2020-11-30 LAB — CBC WITH DIFFERENTIAL/PLATELET
Basophils Absolute: 0 10*3/uL (ref 0.0–0.2)
Basos: 0 %
EOS (ABSOLUTE): 0 10*3/uL (ref 0.0–0.4)
Eos: 1 %
Hematocrit: 36.1 % (ref 34.0–46.6)
Hemoglobin: 10.9 g/dL — ABNORMAL LOW (ref 11.1–15.9)
Immature Grans (Abs): 0 10*3/uL (ref 0.0–0.1)
Immature Granulocytes: 1 %
Lymphocytes Absolute: 0.5 10*3/uL — ABNORMAL LOW (ref 0.7–3.1)
Lymphs: 6 %
MCH: 26.6 pg (ref 26.6–33.0)
MCHC: 30.2 g/dL — ABNORMAL LOW (ref 31.5–35.7)
MCV: 88 fL (ref 79–97)
Monocytes Absolute: 0.4 10*3/uL (ref 0.1–0.9)
Monocytes: 5 %
Neutrophils Absolute: 7.6 10*3/uL — ABNORMAL HIGH (ref 1.4–7.0)
Neutrophils: 87 %
Platelets: 165 10*3/uL (ref 150–450)
RBC: 4.1 x10E6/uL (ref 3.77–5.28)
RDW: 13.1 % (ref 11.7–15.4)
WBC: 8.6 10*3/uL (ref 3.4–10.8)

## 2020-12-01 NOTE — Progress Notes (Signed)
[name], [name] ([id number]) Visit Report for 11/30/2020 Chief Complaint Document Details Patient Name: Date of Service: [name], [name] 11/30/2020 2:45 PM Medical Record Number: [id number] Patient Account Number: [id number] Date of Birth/Sex: Treating RN: Aug 09, 1970 (50 y.o. [name] Primary Care Provider: [name] Other Clinician: Referring Provider: Treating Provider/Extender: Sammuel Bailiff in Treatment: 5 Information Obtained from: Patient Chief Complaint Left lower extremity wound Electronic Signature(s) Signed: 11/30/2020 5:18:24 PM By: [name] DO Entered By: [name] on 11/30/2020 17:04:36 -------------------------------------------------------------------------------- Debridement Details Patient Name: Date of Service: [name], [name] 11/30/2020 2:45 PM Medical Record Number: [id number] Patient Account Number: [id number] Date of Birth/Sex: Treating RN: [birth year] (50 y.o. [name], [name] Primary Care Provider: [name] Other Clinician: Referring Provider: Treating Provider/Extender: Sammuel Bailiff in Treatment: 5 Debridement Performed for Assessment: Wound #4 Left,Medial Lower Leg Performed By: Physician [name], DO Debridement Type: Debridement Severity of Tissue Pre Debridement: Fat layer exposed Level of Consciousness (Pre-procedure): Awake and Alert Pre-procedure Verification/Time Out Yes - 16:45 Taken: Start Time: 16:46 Pain Control: Lidocaine 4% T opical Solution T Area Debrided (L x W): otal 2.7 (cm) x 2.5 (cm) = 6.75 (cm) Tissue and other material debrided: Viable, Non-Viable, Slough, Subcutaneous, Skin: Dermis , Skin: Epidermis, Fibrin/Exudate, Slough Level: Skin/Subcutaneous Tissue Debridement Description: Excisional Instrument: Curette Bleeding: Minimum Hemostasis Achieved: Pressure End Time: 16:50 Procedural Pain: 0 Post Procedural Pain:  0 Response to Treatment: Procedure was tolerated well Level of Consciousness (Post- Awake and Alert procedure): Post Debridement Measurements of Total Wound Length: (cm) 2.7 Width: (cm) 2.5 Depth: (cm) 0.1 Volume: (cm) 0.53 Character of Wound/Ulcer Post Debridement: Improved Severity of Tissue Post Debridement: Fat layer exposed Post Procedure Diagnosis Same as Pre-procedure Electronic Signature(s) Signed: 11/30/2020 5:18:24 PM By: [name] DO Signed: 11/30/2020 6:14:36 PM By: Deon Pilling Entered By: Deon Pilling on 11/30/2020 17:11:43 -------------------------------------------------------------------------------- HPI Details Patient Name: Date of Service: [name], [name] 11/30/2020 2:45 PM Medical Record Number: [id number] Patient Account Number: [id number] Date of Birth/Sex: Treating RN: 1970-10-12 (50 y.o. [name] Primary Care Provider: [name] Other Clinician: Referring Provider: Treating Provider/Extender: Sammuel Bailiff in Treatment: 5 History of Present Illness HPI Description: Admission 6/24 [name] is a 50 year old female with a past medical history of chronic venous insufficiency, lymphedema, DVT on anticoagulation and diastolic heart failure that presents to the clinic for left lower extremity wound. She was last seen 4 months ago in our clinic for the same issue. The reoccurring wound started at the end of May and has been going on for 1 month. She has been using an ointment and I am unclear what this is. She tries to keep her leg elevated with her compression stocking. She also reports she has lymphedema pumps and has been using them as well. She reports mild pain to the area. She denies signs of infection including increased warmth, erythema or purulent drainage. 7/1; patient presents for 1 week follow-up. She has tolerated the wrap well. Unfortunately she did have trouble with the wrap Sliding down her  leg 2 days ago. She has no issues or complaints today. She denies signs of infection. 7/15; patient presents for follow-up. She has tolerated the wrap well. She has no issues or complaints today. She denies signs of infection. 7/25; patient presents for 1 week follow-up. She has no issues or complaints today. She denies signs of infection. 8/4; patient presents for follow-up. She has  tolerated the compression wrap well to her left lower extremity. She states she was in the hospital last week due to fluid overload. She subsequently developed blisters to her bottom from being in the hospital bed for prolonged periods of time. These have since ruptured. She now has 2 areas of skin breakdown. She has been keeping gauze on them. She denies signs of infection. Electronic Signature(s) Signed: 11/30/2020 5:18:24 PM By: [name] DO Entered By: [name] on 11/30/2020 17:07:41 -------------------------------------------------------------------------------- Physical Exam Details Patient Name: Date of Service: Megan Session D. 11/30/2020 2:45 PM Medical Record Number: [id number] Patient Account Number: [id number] Date of Birth/Sex: Treating RN: 1970-12-25 (50 y.o. [name] Primary Care Provider: [name] Other Clinician: Referring Provider: Treating Provider/Extender: Sammuel Bailiff in Treatment: 5 Constitutional respirations regular, non-labored and within target range for patient.[name] Psychiatric pleasant and cooperative. Notes Left lower extremity: On the medial aspect there is an open wound with granulation tissue and non viable tissue present. No signs of infection. Good edema control. Right and left buttocks have areas of skin breakdown from previous blister. no signs of infection. Electronic Signature(s) Signed: 11/30/2020 5:18:24 PM By: [name] DO Entered By: [name] on 11/30/2020  17:09:39 -------------------------------------------------------------------------------- Physician Orders Details Patient Name: Date of Service: Megan Session D. 11/30/2020 2:45 PM Medical Record Number: [id number] Patient Account Number: [id number] Date of Birth/Sex: Treating RN: 03/09/1971 (50 y.o. [name] Primary Care Provider: [name] Other Clinician: Referring Provider: Treating Provider/Extender: Sammuel Bailiff in Treatment: 5 Verbal / Phone Orders: No Diagnosis Coding ICD-10 Coding Code Description [phone] Non-pressure chronic ulcer of other part of left lower leg with unspecified severity I87.2 Venous insufficiency (chronic) (peripheral) I89.0 Lymphedema, not elsewhere classified Z86.718 Personal history of other venous thrombosis and embolism Follow-up Appointments ppointment in 2 weeks. - Dr. Heber [name] Return A Nurse Visit: - 1 week for rewrap Bathing/ Shower/ Hygiene May shower with protection but do not get wound dressing(s) wet. - use cast protector to keep wrap dry in the shower Edema Control - Lymphedema / SCD / Other Bilateral Lower Extremities Lymphedema Pumps. Use Lymphedema pumps on leg(s) 2-3 times a day for 45-60 minutes. If wearing any wraps or hose, do not remove them. Continue exercising as instructed. Elevate legs to the level of the heart or above for 30 minutes daily and/or when sitting, a frequency of: Avoid standing for long periods of time. Patient to wear own compression stockings every day. - right leg daily Exercise regularly Off-Loading Turn and reposition every 2 hours - Ensure not to apply pressure to these areas. Wound Treatment Wound #4 - Lower Leg Wound Laterality: Left, Medial Cleanser: Soap and Water 1 x Per Week/15 Days Discharge Instructions: May shower and wash wound with dial antibacterial soap and water prior to dressing change. Prim Dressing: Hydrofera Blue Ready Foam, 4x5 in 1 x Per  Week/15 Days ary Discharge Instructions: Apply to wound bed as instructed Prim Dressing: Santyl Ointment 1 x Per Week/15 Days ary Discharge Instructions: Apply nickel thick amount to wound bed as instructed Secondary Dressing: Woven Gauze Sponge, Non-Sterile 4x4 in 1 x Per Week/15 Days Discharge Instructions: Apply over primary dressing as directed. Secondary Dressing: ABD Pad, 8x10 1 x Per Week/15 Days Discharge Instructions: Apply over primary dressing as directed. Secondary Dressing: CarboFLEX Odor Control Dressing, 4x4 in 1 x Per Week/15 Days Discharge Instructions: Apply over primary dressing as directed. Compression Wrap: Unnaboot w/Calamine, 4x10 (in/yd) 1 x  Per Week/15 Days Discharge Instructions: unna at top of wrap to help secure Compression Wrap: CoFlex TLC XL 2-layer Compression System 4x7 (in/yd) 1 x Per Week/15 Days Discharge Instructions: Apply CoFlex 2-layer compression as directed. (alt for 4 layer) Wound #5 - Gluteus Wound Laterality: Left Cleanser: Soap and Water 3 x Per Day Discharge Instructions: May shower and wash wound with dial antibacterial soap and water prior to dressing change. Topical: zinc oxide desitin cream 3 x Per Day Discharge Instructions: zinc oxide desitin cream Secondary Dressing: Zetuvit Plus Silicone Border Dressing 4x4 (in/in) 3 x Per Day Discharge Instructions: ***or gauze****Apply silicone border over primary dressing as directed. Wound #6 - Gluteus Wound Laterality: Right Cleanser: Soap and Water 3 x Per Day Discharge Instructions: May shower and wash wound with dial antibacterial soap and water prior to dressing change. Topical: zinc oxide desitin cream 3 x Per Day Discharge Instructions: zinc oxide desitin cream Secondary Dressing: Zetuvit Plus Silicone Border Dressing 4x4 (in/in) 3 x Per Day Discharge Instructions: ***or gauze****Apply silicone border over primary dressing as directed. Electronic Signature(s) Signed: 11/30/2020 5:18:24 PM  By: [name] DO Entered By: [name] on 11/30/2020 17:09:53 -------------------------------------------------------------------------------- Problem List Details Patient Name: Date of Service: Megan Session D. 11/30/2020 2:45 PM Medical Record Number: [id number] Patient Account Number: [id number] Date of Birth/Sex: Treating RN: 01-Apr-1971 (50 y.o. [name], Tammi Klippel Primary Care Provider: [name] Other Clinician: Referring Provider: Treating Provider/Extender: Sammuel Bailiff in Treatment: 5 Active Problems ICD-10 Encounter Code Description Active Date MDM Diagnosis L97.829 Non-pressure chronic ulcer of other part of left lower leg with unspecified 10/20/2020 No Yes severity I87.2 Venous insufficiency (chronic) (peripheral) 10/20/2020 No Yes I89.0 Lymphedema, not elsewhere classified 10/20/2020 No Yes Z86.718 Personal history of other venous thrombosis and embolism 10/20/2020 No Yes Inactive Problems Resolved Problems Electronic Signature(s) Signed: 11/30/2020 5:18:24 PM By: [name] DO Entered By: [name] on 11/30/2020 17:04:10 -------------------------------------------------------------------------------- Progress Note Details Patient Name: Date of Service: [name], [name] 11/30/2020 2:45 PM Medical Record Number: [id number] Patient Account Number: [id number] Date of Birth/Sex: Treating RN: 07/01/1970 (50 y.o. [name] Primary Care Provider: [name] Other Clinician: Referring Provider: Treating Provider/Extender: Sammuel Bailiff in Treatment: 5 Subjective Chief Complaint Information obtained from Patient Left lower extremity wound History of Present Illness (HPI) Admission 6/24 Ms. Mychal Patchett is a 50 year old female with a past medical history of chronic venous insufficiency, lymphedema, DVT on anticoagulation and diastolic heart failure that presents to the  clinic for left lower extremity wound. She was last seen 4 months ago in our clinic for the same issue. The reoccurring wound started at the end of May and has been going on for 1 month. She has been using an ointment and I am unclear what this is. She tries to keep her leg elevated with her compression stocking. She also reports she has lymphedema pumps and has been using them as well. She reports mild pain to the area. She denies signs of infection including increased warmth, erythema or purulent drainage. 7/1; patient presents for 1 week follow-up. She has tolerated the wrap well. Unfortunately she did have trouble with the wrap Sliding down her leg 2 days ago. She has no issues or complaints today. She denies signs of infection. 7/15; patient presents for follow-up. She has tolerated the wrap well. She has no issues or complaints today. She denies signs of infection. 7/25; patient presents for 1 week follow-up. She has no  issues or complaints today. She denies signs of infection. 8/4; patient presents for follow-up. She has tolerated the compression wrap well to her left lower extremity. She states she was in the hospital last week due to fluid overload. She subsequently developed blisters to her bottom from being in the hospital bed for prolonged periods of time. These have since ruptured. She now has 2 areas of skin breakdown. She has been keeping gauze on them. She denies signs of infection. Patient History Information obtained from Patient. Family History Diabetes - Mother,Maternal Grandparents, Heart Disease - Paternal Grandparents, Hypertension - Mother,Maternal Grandparents, Lung Disease - Father,Paternal Grandparents, No family history of Cancer, Hereditary Spherocytosis, Kidney Disease, Seizures, Stroke, Thyroid Problems, Tuberculosis. Social History Former smoker, Marital Status - Single, Alcohol Use - Never, Drug Use - No History, Caffeine Use - Daily. Medical History Eyes Denies  history of Cataracts, Glaucoma, Optic Neuritis Ear/Nose/Mouth/Throat Denies history of Chronic sinus problems/congestion, Middle ear problems Hematologic/Lymphatic Denies history of Anemia, Hemophilia, Human Immunodeficiency Virus, Lymphedema, Sickle Cell Disease Respiratory Patient has history of Sleep Apnea Denies history of Aspiration, Asthma, Chronic Obstructive Pulmonary Disease (COPD), Pneumothorax, Tuberculosis Cardiovascular Patient has history of Congestive Heart Failure, Coronary Artery Disease, Hypertension Denies history of Angina, Arrhythmia, Deep Vein Thrombosis, Hypotension, Myocardial Infarction, Peripheral Arterial Disease, Peripheral Venous Disease, Phlebitis, Vasculitis Gastrointestinal Denies history of Cirrhosis , Colitis, Crohnoos, Hepatitis A, Hepatitis B, Hepatitis C Endocrine Denies history of Type I Diabetes, Type II Diabetes Genitourinary Denies history of End Stage Renal Disease Immunological Denies history of Lupus Erythematosus, Raynaudoos, Scleroderma Integumentary (Skin) Denies history of History of Burn Musculoskeletal Denies history of Gout, Rheumatoid Arthritis, Osteoarthritis, Osteomyelitis Neurologic Denies history of Dementia, Neuropathy, Quadriplegia, Paraplegia, Seizure Disorder Oncologic Denies history of Received Chemotherapy, Received Radiation Psychiatric Denies history of Anorexia/bulimia, Confinement Anxiety Objective Constitutional respirations regular, non-labored and within target range for patient.. Vitals Time Taken: 3:58 PM, Height: 67 in, Weight: 346 lbs, BMI: 54.2, Temperature: 98.2 F, Pulse: 74 bpm, Respiratory Rate: 20 breaths/min, Blood Pressure: 134/93 mmHg. Psychiatric pleasant and cooperative. General Notes: Left lower extremity: On the medial aspect there is an open wound with granulation tissue and non viable tissue present. No signs of infection. Good edema control. Right and left buttocks have areas of skin  breakdown from previous blister. no signs of infection. Integumentary (Hair, Skin) Wound #4 status is Open. Original cause of wound was Gradually Appeared. The date acquired was: 09/26/2020. The wound has been in treatment 5 weeks. The wound is located on the Left,Medial Lower Leg. The wound measures 2.7cm length x 2.5cm width x 0.1cm depth; 5.301cm^2 area and 0.53cm^3 volume. There is Fat Layer (Subcutaneous Tissue) exposed. There is no tunneling or undermining noted. There is a medium amount of serosanguineous drainage noted. The wound margin is distinct with the outline attached to the wound base. There is large (67-100%) red granulation within the wound bed. There is a small (1-33%) amount of necrotic tissue within the wound bed including Adherent Slough. Wound #5 status is Open. Original cause of wound was Pressure Injury. The date acquired was: 11/30/2020. The wound is located on the Left Gluteus. The wound measures 1.5cm length x 1.3cm width x 0.1cm depth; 1.532cm^2 area and 0.153cm^3 volume. There is Fat Layer (Subcutaneous Tissue) exposed. There is no tunneling or undermining noted. There is a medium amount of serosanguineous drainage noted. The wound margin is flat and intact. There is large (67-100%) pink granulation within the wound bed. There is no necrotic tissue within the wound  bed. Wound #6 status is Open. Original cause of wound was Pressure Injury. The date acquired was: 11/30/2020. The wound is located on the Right Gluteus. The wound measures 3.8cm length x 3.6cm width x 0.1cm depth; 10.744cm^2 area and 1.074cm^3 volume. There is Fat Layer (Subcutaneous Tissue) exposed. There is no tunneling or undermining noted. There is a medium amount of serosanguineous drainage noted. The wound margin is flat and intact. There is large (67-100%) pink granulation within the wound bed. There is no necrotic tissue within the wound bed. Assessment Active Problems ICD-10 Non-pressure chronic ulcer of  other part of left lower leg with unspecified severity Venous insufficiency (chronic) (peripheral) Lymphedema, not elsewhere classified Personal history of other venous thrombosis and embolism Patient's left lower extremity wound continues to show improvement in size and appearance since last clinic visit. I debrided nonviable tissue. I recommended continuing Santyl with Hydrofera Blue under compression therapy. Patient now has 2 new wounds to her buttocks due to pressure from being in a hospital bed. She was in the hospital for almost a week and treated for an asthma exacerbation and diastolic heart failure. The wounds are limited to skin breakdown. I recommended using zinc oxide to them and keeping them covered with gauze. I think this is the best solution especially since she states she can not afford to buy dressings. Procedures Wound #4 Pre-procedure diagnosis of Wound #4 is a Venous Leg Ulcer located on the Left,Medial Lower Leg .Severity of Tissue Pre Debridement is: Fat layer exposed. There was a Excisional Skin/Subcutaneous Tissue Debridement with a total area of 6.75 sq cm performed by [name], DO. With the following instrument(s): Curette to remove Viable and Non-Viable tissue/material. Material removed includes Subcutaneous Tissue, Slough, Skin: Dermis, Skin: Epidermis, and Fibrin/Exudate after achieving pain control using Lidocaine 4% Topical Solution. A time out was conducted at 16:45, prior to the start of the procedure. A Minimum amount of bleeding was controlled with Pressure. The procedure was tolerated well with a pain level of 0 throughout and a pain level of 0 following the procedure. Post Debridement Measurements: 2.7cm length x 2.5cm width x 0.1cm depth; 0.53cm^3 volume. Character of Wound/Ulcer Post Debridement is improved. Severity of Tissue Post Debridement is: Fat layer exposed. Post procedure Diagnosis Wound #4: Same as Pre-Procedure Pre-procedure diagnosis of  Wound #4 is a Venous Leg Ulcer located on the Left,Medial Lower Leg . There was a Four Layer Compression Therapy Procedure by Levan Hurst, RN. Post procedure Diagnosis Wound #4: Same as Pre-Procedure Plan Follow-up Appointments: Return Appointment in 2 weeks. - Dr. Heber [place] Nurse Visit: - 1 week for rewrap Bathing/ Shower/ Hygiene: May shower with protection but do not get wound dressing(s) wet. - use cast protector to keep wrap dry in the shower Edema Control - Lymphedema / SCD / Other: Lymphedema Pumps. Use Lymphedema pumps on leg(s) 2-3 times a day for 45-60 minutes. If wearing any wraps or hose, do not remove them. Continue exercising as instructed. Elevate legs to the level of the heart or above for 30 minutes daily and/or when sitting, a frequency of: Avoid standing for long periods of time. Patient to wear own compression stockings every day. - right leg daily Exercise regularly Off-Loading: Turn and reposition every 2 hours - Ensure not to apply pressure to these areas. WOUND #4: - Lower Leg Wound Laterality: Left, Medial Cleanser: Soap and Water 1 x Per Week/15 Days Discharge Instructions: May shower and wash wound with dial antibacterial soap and water prior to dressing  change. Prim Dressing: Hydrofera Blue Ready Foam, 4x5 in 1 x Per Week/15 Days ary Discharge Instructions: Apply to wound bed as instructed Prim Dressing: Santyl Ointment 1 x Per Week/15 Days ary Discharge Instructions: Apply nickel thick amount to wound bed as instructed Secondary Dressing: Woven Gauze Sponge, Non-Sterile 4x4 in 1 x Per Week/15 Days Discharge Instructions: Apply over primary dressing as directed. Secondary Dressing: ABD Pad, 8x10 1 x Per Week/15 Days Discharge Instructions: Apply over primary dressing as directed. Secondary Dressing: CarboFLEX Odor Control Dressing, 4x4 in 1 x Per Week/15 Days Discharge Instructions: Apply over primary dressing as directed. Com pression Wrap: Unnaboot  w/Calamine, 4x10 (in/yd) 1 x Per Week/15 Days Discharge Instructions: unna at top of wrap to help secure Com pression Wrap: CoFlex TLC XL 2-layer Compression System 4x7 (in/yd) 1 x Per Week/15 Days Discharge Instructions: Apply CoFlex 2-layer compression as directed. (alt for 4 layer) WOUND #5: - Gluteus Wound Laterality: Left Cleanser: Soap and Water 3 x Per Day/ Discharge Instructions: May shower and wash wound with dial antibacterial soap and water prior to dressing change. Topical: zinc oxide desitin cream 3 x Per Day/ Discharge Instructions: zinc oxide desitin cream Secondary Dressing: Zetuvit Plus Silicone Border Dressing 4x4 (in/in) 3 x Per Day/ Discharge Instructions: ***or gauze****Apply silicone border over primary dressing as directed. WOUND #6: - Gluteus Wound Laterality: Right Cleanser: Soap and Water 3 x Per Day/ Discharge Instructions: May shower and wash wound with dial antibacterial soap and water prior to dressing change. Topical: zinc oxide desitin cream 3 x Per Day/ Discharge Instructions: zinc oxide desitin cream Secondary Dressing: Zetuvit Plus Silicone Border Dressing 4x4 (in/in) 3 x Per Day/ Discharge Instructions: ***or gauze****Apply silicone border over primary dressing as directed. 1. Hydrofera Blue and Santyl under compression to her left lower extremity 2. Zinc oxide and gauze to her bottom 3. Follow-up in 1 week for nurse visit 4. Follow-up with me in 2 weeks Electronic Signature(s) Signed: 11/30/2020 5:18:24 PM By: [name] DO Entered By: [name] on 11/30/2020 17:17:53 -------------------------------------------------------------------------------- HxROS Details Patient Name: Date of Service: [name], [name] 11/30/2020 2:45 PM Medical Record Number: [id number] Patient Account Number: [id number] Date of Birth/Sex: Treating RN: [birth year] (50 y.o. [name] Primary Care Provider: [name] Other Clinician: Referring  Provider: Treating Provider/Extender: Sammuel Bailiff in Treatment: 5 Information Obtained From Patient Eyes Medical History: Negative for: Cataracts; Glaucoma; Optic Neuritis Ear/Nose/Mouth/Throat Medical History: Negative for: Chronic sinus problems/congestion; Middle ear problems Hematologic/Lymphatic Medical History: Negative for: Anemia; Hemophilia; Human Immunodeficiency Virus; Lymphedema; Sickle Cell Disease Respiratory Medical History: Positive for: Sleep Apnea Negative for: Aspiration; Asthma; Chronic Obstructive Pulmonary Disease (COPD); Pneumothorax; Tuberculosis Cardiovascular Medical History: Positive for: Congestive Heart Failure; Coronary Artery Disease; Hypertension Negative for: Angina; Arrhythmia; Deep Vein Thrombosis; Hypotension; Myocardial Infarction; Peripheral Arterial Disease; Peripheral Venous Disease; Phlebitis; Vasculitis Gastrointestinal Medical History: Negative for: Cirrhosis ; Colitis; Crohns; Hepatitis A; Hepatitis B; Hepatitis C Endocrine Medical History: Negative for: Type I Diabetes; Type II Diabetes Genitourinary Medical History: Negative for: End Stage Renal Disease Immunological Medical History: Negative for: Lupus Erythematosus; Raynauds; Scleroderma Integumentary (Skin) Medical History: Negative for: History of Burn Musculoskeletal Medical History: Negative for: Gout; Rheumatoid Arthritis; Osteoarthritis; Osteomyelitis Neurologic Medical History: Negative for: Dementia; Neuropathy; Quadriplegia; Paraplegia; Seizure Disorder Oncologic Medical History: Negative for: Received Chemotherapy; Received Radiation Psychiatric Medical History: Negative for: Anorexia/bulimia; Confinement Anxiety Immunizations Pneumococcal Vaccine: Received Pneumococcal Vaccination: No Implantable Devices None Family and Social History Cancer: No; Diabetes: Yes -  Mother,Maternal Grandparents; Heart Disease: Yes - Paternal  Grandparents; Hereditary Spherocytosis: No; Hypertension: Yes - Mother,Maternal Grandparents; Kidney Disease: No; Lung Disease: Yes - Father,Paternal Grandparents; Seizures: No; Stroke: No; Thyroid Problems: No; Tuberculosis: No; Former smoker; Marital Status - Single; Alcohol Use: Never; Drug Use: No History; Caffeine Use: Daily; Financial Concerns: No; Food, Clothing or Shelter Needs: No; Support System Lacking: No; Transportation Concerns: No Electronic Signature(s) Signed: 11/30/2020 5:18:24 PM By: [name] DO Signed: 11/30/2020 6:14:36 PM By: Deon Pilling Entered By: [name] on 11/30/2020 17:07:48 -------------------------------------------------------------------------------- SuperBill Details Patient Name: Date of Service: [name], [name] 11/30/2020 Medical Record Number: [id number] Patient Account Number: [id number] Date of Birth/Sex: Treating RN: [birth year] (50 y.o. [name] Primary Care Provider: [name] Other Clinician: Referring Provider: Treating Provider/Extender: Sammuel Bailiff in Treatment: 5 Diagnosis Coding ICD-10 Codes Code Description [phone] Non-pressure chronic ulcer of other part of left lower leg with unspecified severity I87.2 Venous insufficiency (chronic) (peripheral) I89.0 Lymphedema, not elsewhere classified Z86.718 Personal history of other venous thrombosis and embolism Facility Procedures CPT4 Code: [id number] Description: [id number] - DEB SUBQ TISSUE 20 SQ CM/< ICD-10 Diagnosis Description L97.829 Non-pressure chronic ulcer of other part of left lower leg with unspecified sev Modifier: erity Quantity: 1 Physician Procedures : CPT4 Code Description Modifier [id number] - WC PHYS SUBQ TISS 20 SQ CM ICD-10 Diagnosis Description L97.829 Non-pressure chronic ulcer of other part of left lower leg with unspecified severity Quantity: 1 Electronic Signature(s) Signed: 11/30/2020 5:18:24 PM By: [name] DO Entered By: [name] on 11/30/2020 17:17:59

## 2020-12-01 NOTE — Progress Notes (Signed)
CORTEZ, NACKE ([id number]) Visit Report for 11/30/2020 Arrival Information Details Patient Name: Date of Service: Megan, [name] 11/30/2020 2:45 PM Medical Record Number: [id number] Patient Account Number: [id number] Date of Birth/Sex: Treating RN: 29-May-1970 (50 y.o. Benjamine Sprague, Briant Cedar Primary Care Burgandy Hackworth: [name] Other Clinician: Referring Jacqueline Spofford: Treating Ardine Iacovelli/Extender: Sammuel Bailiff in Treatment: 5 Visit Information History Since Last Visit Added or deleted any medications: No Patient Arrived: Ambulatory Any new allergies or adverse reactions: No Arrival Time: 15:58 Had a fall or experienced change in No Accompanied By: alone activities of daily living that may affect Transfer Assistance: None risk of falls: Patient Identification Verified: Yes Signs or symptoms of abuse/neglect since last visito No Secondary Verification Process Completed: Yes Hospitalized since last visit: Yes Patient Requires Transmission-Based Precautions: No Implantable device outside of the clinic excluding No Patient Has Alerts: Yes cellular tissue based products placed in the center Patient Alerts: ABI's: 11/21 L:1.17 since last visit: Has Dressing in Place as Prescribed: Yes Has Compression in Place as Prescribed: Yes Pain Present Now: No Electronic Signature(s) Signed: 12/01/2020 12:35:09 PM By: Levan Hurst RN, BSN Entered By: Levan Hurst on 11/30/2020 15:59:15 -------------------------------------------------------------------------------- Compression Therapy Details Patient Name: Date of Service: [name], [name]. 11/30/2020 2:45 PM Medical Record Number: [id number] Patient Account Number: [id number] Date of Birth/Sex: Treating RN: 08/05/1970 (50 y.o. [name] Primary Care Chloey Ricard: [name] Other Clinician: Referring Jamarquis Crull: Treating Keyli Duross/Extender: Sammuel Bailiff in Treatment:  5 Compression Therapy Performed for Wound Assessment: Wound #4 Left,Medial Lower Leg Performed By: Clinician Levan Hurst, RN Compression Type: Four Layer Post Procedure Diagnosis Same as Pre-procedure Electronic Signature(s) Signed: 11/30/2020 6:14:36 PM By: Deon Pilling Entered By: Deon Pilling on 11/30/2020 16:45:12 -------------------------------------------------------------------------------- Encounter Discharge Information Details Patient Name: Date of Service: [name]. 11/30/2020 2:45 PM Medical Record Number: [id number] Patient Account Number: [id number] Date of Birth/Sex: Treating RN: Apr 18, 1971 (50 y.o. [name] Primary Care Daveah Varone: [name] Other Clinician: Referring Ronae Noell: Treating Shaquill Iseman/Extender: Sammuel Bailiff in Treatment: 5 Encounter Discharge Information Items Discharge Condition: Stable Ambulatory Status: Ambulatory Discharge Destination: Home Transportation: Private Auto Accompanied By: alone Schedule Follow-up Appointment: Yes Clinical Summary of Care: Patient Declined Electronic Signature(s) Signed: 12/01/2020 12:35:09 PM By: Levan Hurst RN, BSN Entered By: Levan Hurst on 11/30/2020 17:07:54 -------------------------------------------------------------------------------- Lower Extremity Assessment Details Patient Name: Date of Service: [name]. 11/30/2020 2:45 PM Medical Record Number: [id number] Patient Account Number: [id number] Date of Birth/Sex: Treating RN: Sep 05, 1970 (50 y.o. [name] Primary Care Dhillon Comunale: [name] Other Clinician: Referring Antoniette Peake: Treating Kenlie Seki/Extender: Sammuel Bailiff in Treatment: 5 Edema Assessment Assessed: [name]: No] [Right: No] Edema: [Left: Ye] [Right: s] Calf Left: Right: Point of Measurement: 25.1 cm From Medial Instep 43 cm Ankle Left: Right: Point of Measurement: 9 cm From Medial  Instep 24 cm Electronic Signature(s) Signed: 12/01/2020 12:35:09 PM By: Levan Hurst RN, BSN Entered By: Levan Hurst on 11/30/2020 16:09:39 -------------------------------------------------------------------------------- Multi Wound Chart Details Patient Name: Date of Service: [name], [name]. 11/30/2020 2:45 PM Medical Record Number: [id number] Patient Account Number: [id number] Date of Birth/Sex: Treating RN: 1970/07/04 (50 y.o. [name] Primary Care Meris Reede: [name] Other Clinician: Referring Zarielle Cea: Treating Deara Bober/Extender: Sammuel Bailiff in Treatment: 5 Vital Signs Height(in): 67 Pulse(bpm): 74 Weight(lbs): 346 Blood Pressure(mmHg): 134/93 Body Mass Index(BMI): 54 Temperature(F): 98.2 Respiratory Rate(breaths/min): 20 Photos: Left, Medial Lower Leg Left  Gluteus Right Gluteus Wound Location: Gradually Appeared Pressure Injury Pressure Injury Wounding Event: Venous Leg Ulcer Pressure Ulcer Pressure Ulcer Primary Etiology: Sleep Apnea, Congestive Heart Sleep Apnea, Congestive Heart Sleep Apnea, Congestive Heart Comorbid History: Failure, Coronary Artery Disease, Failure, Coronary Artery Disease, Failure, Coronary Artery Disease, Hypertension Hypertension Hypertension 09/26/2020 11/30/2020 11/30/2020 Date Acquired: 5 0 0 Weeks of Treatment: Open Open Open Wound Status: 2.7x2.5x0.1 1.5x1.3x0.1 3.8x3.6x0.1 Measurements L x W x [name] (cm) 5.301 1.532 10.744 A (cm) : rea 0.53 0.153 1.074 Volume (cm) : 67.40% 0.00% 0.00% % Reduction in Area: 67.40% 0.00% 0.00% % Reduction in Volume: Full Thickness Without Exposed Category/Stage II Category/Stage II Classification: Support Structures Medium Medium Medium Exudate Amount: Serosanguineous Serosanguineous Serosanguineous Exudate Type: red, brown red, brown red, brown Exudate Color: Distinct, outline attached Flat and Intact Flat and Intact Wound Margin: Large  (67-100%) Large (67-100%) Large (67-100%) Granulation Amount: Red Pink Pink Granulation Quality: Small (1-33%) None Present (0%) None Present (0%) Necrotic Amount: Fat Layer (Subcutaneous Tissue): Yes Fat Layer (Subcutaneous Tissue): Yes Fat Layer (Subcutaneous Tissue): Yes Exposed Structures: Fascia: No Fascia: No Fascia: No Tendon: No Tendon: No Tendon: No Muscle: No Muscle: No Muscle: No Joint: No Joint: No Joint: No Bone: No Bone: No Bone: No Small (1-33%) None None Epithelialization: Compression Therapy N/A N/A Procedures Performed: Treatment Notes Electronic Signature(s) Signed: 11/30/2020 5:18:24 PM By: [name] DO Signed: 11/30/2020 6:14:36 PM By: Deon Pilling Entered By: [name] on 11/30/2020 17:04:15 -------------------------------------------------------------------------------- Multi-Disciplinary Care Plan Details Patient Name: Date of Service: [name], [name]. 11/30/2020 2:45 PM Medical Record Number: [id number] Patient Account Number: [id number] Date of Birth/Sex: Treating RN: 21-Jan-1971 (50 y.o. [name], Tammi Klippel Primary Care Anastazja Isaac: [name] Other Clinician: Referring Onyekachi Gathright: Treating Senan Urey/Extender: Sammuel Bailiff in Treatment: 5 Multidisciplinary Care Plan reviewed with physician Active Inactive Venous Leg Ulcer Nursing Diagnoses: Actual venous Insuffiency (use after diagnosis is confirmed) Knowledge deficit related to disease process and management Potential for venous Insuffiency (use before diagnosis confirmed) Goals: Patient will maintain optimal edema control Date Initiated: 10/20/2020 Target Resolution Date: 12/29/2020 Goal Status: Active Interventions: Assess peripheral edema status every visit. Compression as ordered Treatment Activities: Therapeutic compression applied : 10/20/2020 Notes: Wound/Skin Impairment Nursing Diagnoses: Impaired tissue integrity Knowledge deficit  related to ulceration/compromised skin integrity Goals: Patient/caregiver will verbalize understanding of skin care regimen Date Initiated: 10/20/2020 Target Resolution Date: 12/29/2020 Goal Status: Active Ulcer/skin breakdown will have a volume reduction of 30% by week 4 Date Initiated: 10/20/2020 Target Resolution Date: 11/24/2020 Goal Status: Active Interventions: Assess patient/caregiver ability to obtain necessary supplies Assess patient/caregiver ability to perform ulcer/skin care regimen upon admission and as needed Assess ulceration(s) every visit Provide education on ulcer and skin care Treatment Activities: Skin care regimen initiated : 10/20/2020 Topical wound management initiated : 10/20/2020 Notes: Electronic Signature(s) Signed: 11/30/2020 6:14:36 PM By: Deon Pilling Entered By: Deon Pilling on 11/30/2020 14:50:57 -------------------------------------------------------------------------------- Pain Assessment Details Patient Name: Date of Service: [name], [name]. 11/30/2020 2:45 PM Medical Record Number: [id number] Patient Account Number: [id number] Date of Birth/Sex: Treating RN: Oct 21, 1970 (50 y.o. [name] Primary Care Ean Gettel: [name] Other Clinician: Referring Takeyah Wieman: Treating Jama Krichbaum/Extender: Sammuel Bailiff in Treatment: 5 Active Problems Location of Pain Severity and Description of Pain Patient Has Paino No Site Locations Pain Management and Medication Current Pain Management: Electronic Signature(s) Signed: 12/01/2020 12:35:09 PM By: Levan Hurst RN, BSN Entered By: Levan Hurst on 11/30/2020 15:59:49 -------------------------------------------------------------------------------- Patient/Caregiver Education Details Patient  Name: Date of Service: Megan, [name] 8/4/2022andnbsp2:45 PM Medical Record Number: [id number] Patient Account Number: [id number] Date of Birth/Gender: Treating  RN: 11-Sep-1970 (50 y.o. [name] Primary Care Physician: [name] Other Clinician: Referring Physician: Treating Physician/Extender: Sammuel Bailiff in Treatment: 5 Education Assessment Education Provided To: Patient Education Topics Provided Wound/Skin Impairment: Handouts: Skin Care Do's and Dont's Methods: Explain/Verbal Responses: Reinforcements needed Electronic Signature(s) Signed: 11/30/2020 6:14:36 PM By: Deon Pilling Entered By: Deon Pilling on 11/30/2020 14:51:07 -------------------------------------------------------------------------------- Wound Assessment Details Patient Name: Date of Service: [name], [name]. 11/30/2020 2:45 PM Medical Record Number: [id number] Patient Account Number: [id number] Date of Birth/Sex: Treating RN: 1970/06/19 (50 y.o. [name] Primary Care Truth Wolaver: [name] Other Clinician: Referring Mildred Tuccillo: Treating Kathalene Sporer/Extender: Sammuel Bailiff in Treatment: 5 Wound Status Wound Number: 4 Primary Venous Leg Ulcer Etiology: Wound Location: Left, Medial Lower Leg Wound Open Wounding Event: Gradually Appeared Status: Date Acquired: 09/26/2020 Comorbid Sleep Apnea, Congestive Heart Failure, Coronary Artery Weeks Of Treatment: 5 History: Disease, Hypertension Clustered Wound: No Photos Wound Measurements Length: (cm) 2.7 Width: (cm) 2.5 Depth: (cm) 0.1 Area: (cm) 5.301 Volume: (cm) 0.53 % Reduction in Area: 67.4% % Reduction in Volume: 67.4% Epithelialization: Small (1-33%) Tunneling: No Undermining: No Wound Description Classification: Full Thickness Without Exposed Support Structures Wound Margin: Distinct, outline attached Exudate Amount: Medium Exudate Type: Serosanguineous Exudate Color: red, brown Foul Odor After Cleansing: No Slough/Fibrino Yes Wound Bed Granulation Amount: Large (67-100%) Exposed Structure Granulation Quality:  Red Fascia Exposed: No Necrotic Amount: Small (1-33%) Fat Layer (Subcutaneous Tissue) Exposed: Yes Necrotic Quality: Adherent Slough Tendon Exposed: No Muscle Exposed: No Joint Exposed: No Bone Exposed: No Treatment Notes Wound #4 (Lower Leg) Wound Laterality: Left, Medial Cleanser Soap and Water Discharge Instruction: May shower and wash wound with dial antibacterial soap and water prior to dressing change. Peri-Wound Care Topical Primary Dressing Hydrofera Blue Ready Foam, 4x5 in Discharge Instruction: Apply to wound bed as instructed Santyl Ointment Discharge Instruction: Apply nickel thick amount to wound bed as instructed Secondary Dressing Woven Gauze Sponge, Non-Sterile 4x4 in Discharge Instruction: Apply over primary dressing as directed. ABD Pad, 8x10 Discharge Instruction: Apply over primary dressing as directed. CarboFLEX Odor Control Dressing, 4x4 in Discharge Instruction: Apply over primary dressing as directed. Secured With Compression Wrap Unnaboot w/Calamine, 4x10 (in/yd) Discharge Instruction: unna at top of wrap to help secure CoFlex TLC XL 2-layer Compression System 4x7 (in/yd) Discharge Instruction: Apply CoFlex 2-layer compression as directed. (alt for 4 layer) Compression Stockings Add-Ons Electronic Signature(s) Signed: 11/30/2020 5:39:10 PM By: Baruch Gouty RN, BSN Signed: 12/01/2020 12:35:09 PM By: Levan Hurst RN, BSN Entered By: Baruch Gouty on 11/30/2020 16:06:47 -------------------------------------------------------------------------------- Wound Assessment Details Patient Name: Date of Service: [name], [name]. 11/30/2020 2:45 PM Medical Record Number: [id number] Patient Account Number: [id number] Date of Birth/Sex: Treating RN: 1970-05-16 (50 y.o. [name] Primary Care Joann Kulpa: [name] Other Clinician: Referring Iyania Denne: Treating Ludie Hudon/Extender: Sammuel Bailiff in Treatment:  5 Wound Status Wound Number: 5 Primary Pressure Ulcer Etiology: Wound Location: Left Gluteus Wound Open Wounding Event: Pressure Injury Status: Date Acquired: 11/30/2020 Comorbid Sleep Apnea, Congestive Heart Failure, Coronary Artery Weeks Of Treatment: 0 History: Disease, Hypertension Clustered Wound: No Photos Wound Measurements Length: (cm) 1.5 Width: (cm) 1.3 Depth: (cm) 0.1 Area: (cm) 1.532 Volume: (cm) 0.153 % Reduction in Area: 0% % Reduction in Volume: 0% Epithelialization: None Tunneling: No Undermining: No Wound Description Classification:  Category/Stage II Wound Margin: Flat and Intact Exudate Amount: Medium Exudate Type: Serosanguineous Exudate Color: red, brown Foul Odor After Cleansing: No Slough/Fibrino No Wound Bed Granulation Amount: Large (67-100%) Exposed Structure Granulation Quality: Pink Fascia Exposed: No Necrotic Amount: None Present (0%) Fat Layer (Subcutaneous Tissue) Exposed: Yes Tendon Exposed: No Muscle Exposed: No Joint Exposed: No Bone Exposed: No Treatment Notes Wound #5 (Gluteus) Wound Laterality: Left Cleanser Soap and Water Discharge Instruction: May shower and wash wound with dial antibacterial soap and water prior to dressing change. Peri-Wound Care Topical zinc oxide desitin cream Discharge Instruction: zinc oxide desitin cream Primary Dressing Secondary Dressing Zetuvit Plus Silicone Border Dressing 4x4 (in/in) Discharge Instruction: ***or gauze****Apply silicone border over primary dressing as directed. Secured With Compression Wrap Compression Stockings Environmental education officer) Signed: 11/30/2020 5:39:10 PM By: Baruch Gouty RN, BSN Signed: 12/01/2020 12:35:09 PM By: Levan Hurst RN, BSN Entered By: Baruch Gouty on 11/30/2020 16:08:06 -------------------------------------------------------------------------------- Wound Assessment Details Patient Name: Date of Service: [name], Lakeria [name]. 11/30/2020  2:45 PM Medical Record Number: [id number] Patient Account Number: [id number] Date of Birth/Sex: Treating RN: April 15, 1971 (50 y.o. [name] Primary Care Karas Pickerill: [name] Other Clinician: Referring Berdell Hostetler: Treating Shauntea Lok/Extender: Sammuel Bailiff in Treatment: 5 Wound Status Wound Number: 6 Primary Pressure Ulcer Etiology: Wound Location: Right Gluteus Wound Open Wounding Event: Pressure Injury Status: Date Acquired: 11/30/2020 Comorbid Sleep Apnea, Congestive Heart Failure, Coronary Artery Weeks Of Treatment: 0 History: Disease, Hypertension Clustered Wound: No Photos Wound Measurements Length: (cm) 3.8 Width: (cm) 3.6 Depth: (cm) 0.1 Area: (cm) 10.744 Volume: (cm) 1.074 % Reduction in Area: 0% % Reduction in Volume: 0% Epithelialization: None Tunneling: No Undermining: No Wound Description Classification: Category/Stage II Wound Margin: Flat and Intact Exudate Amount: Medium Exudate Type: Serosanguineous Exudate Color: red, brown Foul Odor After Cleansing: No Slough/Fibrino No Wound Bed Granulation Amount: Large (67-100%) Exposed Structure Granulation Quality: Pink Fascia Exposed: No Necrotic Amount: None Present (0%) Fat Layer (Subcutaneous Tissue) Exposed: Yes Tendon Exposed: No Muscle Exposed: No Joint Exposed: No Bone Exposed: No Treatment Notes Wound #6 (Gluteus) Wound Laterality: Right Cleanser Soap and Water Discharge Instruction: May shower and wash wound with dial antibacterial soap and water prior to dressing change. Peri-Wound Care Topical zinc oxide desitin cream Discharge Instruction: zinc oxide desitin cream Primary Dressing Secondary Dressing Zetuvit Plus Silicone Border Dressing 4x4 (in/in) Discharge Instruction: ***or gauze****Apply silicone border over primary dressing as directed. Secured With Compression Wrap Compression Stockings Environmental education officer) Signed: 11/30/2020  5:39:10 PM By: Baruch Gouty RN, BSN Signed: 12/01/2020 12:35:09 PM By: Levan Hurst RN, BSN Entered By: Baruch Gouty on 11/30/2020 16:08:52 -------------------------------------------------------------------------------- Vitals Details Patient Name: Date of Service: [name], Aydee [name]. 11/30/2020 2:45 PM Medical Record Number: [id number] Patient Account Number: [id number] Date of Birth/Sex: Treating RN: 06-25-1970 (51 y.o. [name] Primary Care Suhayb Anzalone: [name] Other Clinician: Referring Deanie Jupiter: Treating Alonzo Owczarzak/Extender: Sammuel Bailiff in Treatment: 5 Vital Signs Time Taken: 15:58 Temperature (F): 98.2 Height (in): 67 Pulse (bpm): 74 Weight (lbs): 346 Respiratory Rate (breaths/min): 20 Body Mass Index (BMI): 54.2 Blood Pressure (mmHg): 134/93 Reference Range: 80 - 120 mg / dl Electronic Signature(s) Signed: 12/01/2020 12:35:09 PM By: Levan Hurst RN, BSN Entered By: Levan Hurst on 11/30/2020 15:59:44

## 2020-12-05 ENCOUNTER — Encounter (HOSPITAL_BASED_OUTPATIENT_CLINIC_OR_DEPARTMENT_OTHER): Payer: Medicaid Other | Admitting: Physician Assistant

## 2020-12-05 ENCOUNTER — Other Ambulatory Visit: Payer: Self-pay

## 2020-12-05 DIAGNOSIS — L97829 Non-pressure chronic ulcer of other part of left lower leg with unspecified severity: Secondary | ICD-10-CM | POA: Diagnosis not present

## 2020-12-05 NOTE — Progress Notes (Addendum)
EISLEE, LIGMAN ([id number]) Visit Report for 12/05/2020 Arrival Information Details Patient Name: Date of Service: WILLOW, BLACKETER D. 12/05/2020 9:00 A M Medical Record Number: [id number] Patient Account Number: [id number] Date of Birth/Sex: Treating RN: [birth year] (50 y.o. [name] Primary Care Krystalynn Ridgeway: [name] Other Clinician: Referring Everette Dimauro: Treating Quadre Bristol/Extender: Randon Goldsmith in Treatment: 6 Visit Information History Since Last Visit Added or deleted any medications: No Patient Arrived: Ambulatory Any new allergies or adverse reactions: No Arrival Time: 08:53 Had a fall or experienced change in No Transfer Assistance: None activities of daily living that may affect Patient Identification Verified: Yes risk of falls: Secondary Verification Process Completed: Yes Signs or symptoms of abuse/neglect since last visito No Patient Requires Transmission-Based Precautions: No Hospitalized since last visit: No Patient Has Alerts: Yes Implantable device outside of the clinic excluding No Patient Alerts: ABI's: 11/21 L:1.17 cellular tissue based products placed in the center since last visit: Has Dressing in Place as Prescribed: Yes Has Compression in Place as Prescribed: Yes Pain Present Now: No Electronic Signature(s) Signed: 12/05/2020 8:53:26 AM By: [name] Entered By: [name] on 12/05/2020 08:53:26 -------------------------------------------------------------------------------- Compression Therapy Details Patient Name: Date of Service: [name], Batsheva D. 12/05/2020 9:00 A M Medical Record Number: [id number] Patient Account Number: [id number] Date of Birth/Sex: Treating RN: 20-Feb-1971 (50 y.o. [name] Primary Care Majel Giel: [name] Other Clinician: Referring Joli Koob: Treating Dean Goldner/Extender: Randon Goldsmith in Treatment: 6 Compression Therapy Performed for Wound  Assessment: Wound #4 Left,Medial Lower Leg Performed By: Clinician [name], RN Compression Type: Double Layer Electronic Signature(s) Signed: 12/05/2020 8:56:15 AM By: [name] Entered By: [name] on 12/05/2020 08:56:14 -------------------------------------------------------------------------------- Encounter Discharge Information Details Patient Name: Date of Service: [name], Troyce D. 12/05/2020 9:00 A M Medical Record Number: [id number] Patient Account Number: [id number] Date of Birth/Sex: Treating RN: [birth year] (50 y.o. [name] Primary Care Ron Junco: [name] Other Clinician: Referring Magan Winnett: Treating Love Chowning/Extender: Randon Goldsmith in Treatment: 6 Encounter Discharge Information Items Discharge Condition: Stable Ambulatory Status: Ambulatory Discharge Destination: Home Transportation: Private Auto Schedule Follow-up Appointment: Yes Clinical Summary of Care: Provided on 12/05/2020 Form Type Recipient Paper Patient Patient Electronic Signature(s) Signed: 12/05/2020 8:57:06 AM By: [name] Entered By: [name] on 12/05/2020 08:57:06 -------------------------------------------------------------------------------- Patient/Caregiver Education Details Patient Name: Date of Service: [name] 8/9/2022andnbsp9:00 A M Medical Record Number: [id number] Patient Account Number: [id number] Date of Birth/Gender: Treating RN: Aug 23, 1970 (50 y.o. [name] Primary Care Physician: [name] Other Clinician: Referring Physician: Treating Physician/Extender: Randon Goldsmith in Treatment: 6 Education Assessment Education Provided To: Patient Education Topics Provided Venous: Methods: Explain/Verbal, Printed Responses: State content correctly Wound/Skin Impairment: Methods: Explain/Verbal, Printed Responses: State content correctly Electronic  Signature(s) Signed: 12/05/2020 5:39:13 PM By: [name] Entered By: [name] on 12/05/2020 08:56:49 -------------------------------------------------------------------------------- Wound Assessment Details Patient Name: Date of Service: [name], Eldora D. 12/05/2020 9:00 A M Medical Record Number: [id number] Patient Account Number: [id number] Date of Birth/Sex: Treating RN: Nov 17, 1970 (50 y.o. [name] Primary Care Karizma Cheek: [name] Other Clinician: Referring Shawndrea Rutkowski: Treating Reene Harlacher/Extender: Randon Goldsmith in Treatment: 6 Wound Status Wound Number: 4 Primary Venous Leg Ulcer Etiology: Wound Location: Left, Medial Lower Leg Wound Open Wounding Event: Gradually Appeared Status: Date Acquired: 09/26/2020 Comorbid Sleep Apnea, Congestive Heart Failure, Coronary Artery Weeks Of Treatment: 6 History: Disease, Hypertension Clustered  Wound: No Wound Measurements Length: (cm) 2.7 Width: (cm) 2.5 Depth: (cm) 0.1 Area: (cm) 5.301 Volume: (cm) 0.53 % Reduction in Area: 67.4% % Reduction in Volume: 67.4% Epithelialization: Small (1-33%) Tunneling: No Undermining: No Wound Description Classification: Full Thickness Without Exposed Support Structures Wound Margin: Distinct, outline attached Exudate Amount: Medium Exudate Type: Serosanguineous Exudate Color: red, brown Foul Odor After Cleansing: No Slough/Fibrino No Wound Bed Granulation Amount: Large (67-100%) Exposed Structure Granulation Quality: Red Fascia Exposed: No Necrotic Amount: None Present (0%) Fat Layer (Subcutaneous Tissue) Exposed: Yes Tendon Exposed: No Muscle Exposed: No Joint Exposed: No Bone Exposed: No Treatment Notes Wound #4 (Lower Leg) Wound Laterality: Left, Medial Cleanser Soap and Water Discharge Instruction: May shower and wash wound with dial antibacterial soap and water prior to dressing change. Peri-Wound Care Topical Primary  Dressing Hydrofera Blue Ready Foam, 4x5 in Discharge Instruction: Apply to wound bed as instructed Santyl Ointment Discharge Instruction: Apply nickel thick amount to wound bed as instructed Secondary Dressing Woven Gauze Sponge, Non-Sterile 4x4 in Discharge Instruction: Apply over primary dressing as directed. ABD Pad, 8x10 Discharge Instruction: Apply over primary dressing as directed. CarboFLEX Odor Control Dressing, 4x4 in Discharge Instruction: Apply over primary dressing as directed. Secured With Compression Wrap Unnaboot w/Calamine, 4x10 (in/yd) Discharge Instruction: unna at top of wrap to help secure CoFlex TLC XL 2-layer Compression System 4x7 (in/yd) Discharge Instruction: Apply CoFlex 2-layer compression as directed. (alt for 4 layer) Compression Stockings Add-Ons Electronic Signature(s) Signed: 12/05/2020 8:54:59 AM By: [name] Entered By: [name] on 12/05/2020 08:54:59 -------------------------------------------------------------------------------- Wound Assessment Details Patient Name: Date of Service: [name], Torsha D. 12/05/2020 9:00 A M Medical Record Number: [id number] Patient Account Number: [id number] Date of Birth/Sex: Treating RN: 1970/08/24 (50 y.o. [name] Primary Care Mekaela Azizi: [name] Other Clinician: Referring Icey Tello: Treating Kamylah Manzo/Extender: Randon Goldsmith in Treatment: 6 Wound Status Wound Number: 5 Primary Pressure Ulcer Etiology: Wound Location: Left Gluteus Wound Open Wounding Event: Pressure Injury Status: Date Acquired: 11/30/2020 Comorbid Sleep Apnea, Congestive Heart Failure, Coronary Artery Weeks Of Treatment: 0 History: Disease, Hypertension Clustered Wound: No Wound Measurements Length: (cm) 1.5 Width: (cm) 1.3 Depth: (cm) 0.1 Area: (cm) 1.532 Volume: (cm) 0.153 % Reduction in Area: 0% % Reduction in Volume: 0% Epithelialization: None Tunneling:  No Undermining: No Wound Description Classification: Category/Stage II Wound Margin: Distinct, outline attached Exudate Amount: Medium Exudate Type: Serosanguineous Exudate Color: red, brown Foul Odor After Cleansing: No Slough/Fibrino No Wound Bed Granulation Amount: Large (67-100%) Exposed Structure Granulation Quality: Pink Fascia Exposed: No Necrotic Amount: None Present (0%) Fat Layer (Subcutaneous Tissue) Exposed: Yes Tendon Exposed: No Muscle Exposed: No Joint Exposed: No Bone Exposed: No Treatment Notes Wound #5 (Gluteus) Wound Laterality: Left Cleanser Soap and Water Discharge Instruction: May shower and wash wound with dial antibacterial soap and water prior to dressing change. Peri-Wound Care Topical zinc oxide desitin cream Discharge Instruction: zinc oxide desitin cream Primary Dressing Secondary Dressing Zetuvit Plus Silicone Border Dressing 4x4 (in/in) Discharge Instruction: ***or gauze****Apply silicone border over primary dressing as directed. Secured With Compression Wrap Compression Stockings Environmental education officer) Signed: 12/05/2020 8:55:32 AM By: [name] Entered By: [name] on 12/05/2020 08:55:32 -------------------------------------------------------------------------------- Wound Assessment Details Patient Name: Date of Service: [name], Konni D. 12/05/2020 9:00 A M Medical Record Number: [id number] Patient Account Number: [id number] Date of Birth/Sex: Treating RN: 06-19-1970 (50 y.o. [name] Primary Care Summerlynn Glauser: [name] Other Clinician: Referring Samaira Holzworth: Treating Kaliope Quinonez/Extender: Worthy Keeler  [name] Weeks in Treatment: 6 Wound Status Wound Number: 6 Primary Pressure Ulcer Etiology: Wound Location: Right Gluteus Wound Open Wounding Event: Pressure Injury Status: Date Acquired: 11/30/2020 Comorbid Sleep Apnea, Congestive Heart Failure, Coronary Artery Weeks Of Treatment:  0 History: Disease, Hypertension Clustered Wound: No Wound Measurements Length: (cm) 3.8 Width: (cm) 3.6 Depth: (cm) 0.1 Area: (cm) 10.744 Volume: (cm) 1.074 % Reduction in Area: 0% % Reduction in Volume: 0% Epithelialization: None Tunneling: No Undermining: No Wound Description Classification: Category/Stage II Wound Margin: Distinct, outline attached Exudate Amount: Medium Exudate Type: Serosanguineous Exudate Color: red, brown Foul Odor After Cleansing: No Slough/Fibrino No Wound Bed Granulation Amount: Large (67-100%) Exposed Structure Granulation Quality: Pink Fascia Exposed: No Necrotic Amount: None Present (0%) Fat Layer (Subcutaneous Tissue) Exposed: Yes Tendon Exposed: No Muscle Exposed: No Joint Exposed: No Bone Exposed: No Treatment Notes Wound #6 (Gluteus) Wound Laterality: Right Cleanser Soap and Water Discharge Instruction: May shower and wash wound with dial antibacterial soap and water prior to dressing change. Peri-Wound Care Topical zinc oxide desitin cream Discharge Instruction: zinc oxide desitin cream Primary Dressing Secondary Dressing Zetuvit Plus Silicone Border Dressing 4x4 (in/in) Discharge Instruction: ***or gauze****Apply silicone border over primary dressing as directed. Secured With Compression Wrap Compression Stockings Environmental education officer) Signed: 12/05/2020 8:55:58 AM By: [name] Entered By: [name] on 12/05/2020 08:55:58 -------------------------------------------------------------------------------- Vitals Details Patient Name: Date of Service: [name], Breahna D. 12/05/2020 9:00 A M Medical Record Number: [id number] Patient Account Number: [id number] Date of Birth/Sex: Treating RN: Jul 15, 1970 (50 y.o. [name] Primary Care Havard Radigan: [name] Other Clinician: Referring Kashish Yglesias: Treating Terisha Losasso/Extender: Randon Goldsmith in Treatment: 6 Vital Signs Time  Taken: 08:40 Temperature (F): 98 Height (in): 67 Pulse (bpm): 78 Weight (lbs): 346 Respiratory Rate (breaths/min): 18 Body Mass Index (BMI): 54.2 Blood Pressure (mmHg): 132/88 Reference Range: 80 - 120 mg / dl Electronic Signature(s) Signed: 12/05/2020 8:53:52 AM By: [name] Entered By: [name] on 12/05/2020 08:53:52

## 2020-12-05 NOTE — Progress Notes (Signed)
Synopsis: Referred for asthma by Aline August, MD  Subjective:   PATIENT ID: [name] GENDER: female DOB: Apr 17, 1971, MRN: [id number]  Chief Complaint  Patient presents with   Consult    Consult. Recent hospital visit for respiratory failure and sepsis. Currently using Dulera.     50yF with history of recurrent DVT, IDA with port for iron infusions, obesity, diastolic dysfunction, OSA last PSG done years ago in [place] in 2018 and was on CPAP referred for asthma followeing recent hospitalization for acute on chronic diastolic HF exacerbation, possible asthma exacerbation, discharged 8/1.   She says that prior to last hospitalization she had gone to cancer center to get her portacath flushed. She rapidly developed worsening cough when she got home. What she understands of hospitalization is that she had a whole lot of fluid on her heart which they relieved with lasix. She is unsure what made biggest difference with her recovery - whether it was treatment of CHF or her inhalers. She is unsure what her dry weight is. She wonders if stress is a trigger for her asthma. She does not have much of a cough currently. DOE to 10 minutes which has very gradually worsened over last couple of years.   Regarding her OSA her DME supplier had closed up shop by the time she tried to call with issues she was having with her machine. She does have a whole lot of daytime sleepiness. She does not snore. She does not have PND.  Otherwise pertinent review of systems is negative.  Father had emphysema, uncle with stomach cancer, grandmother died of breast cancer  She has 59 pack year smoking history quit in 2005  She is disabled, in past worked at [employer], Art therapist. SHe has two dogs at home, no pet bird.     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   Morbid obesity due to excess calories (HCC)    Osteoarthritis of left knee    Prediabetes    Small bowel obstruction ([place])    as a child   Speech impediment    Stutter / stammer     Family History  Problem Relation Age of Onset   Diabetes Mellitus II Mother    COPD Father    Diabetes Father    Diabetes Mellitus II Maternal Grandmother    Breast cancer Paternal Grandfather      Past Surgical History:  Procedure Laterality Date   ABDOMINAL WALL DEFECT REPAIR  1970   IR CV LINE INJECTION  10/24/2020   IVC FILTER INSERTION  2017   [place]  2014    Social History   Socioeconomic  History   Marital status: Single    Spouse name: Not on file   Number of children: 0   Years of education: Not on file   Highest education level: 12th grade  Occupational History   Occupation: unemployed on disablity  Tobacco Use   Smoking status: Former   Smokeless tobacco: Never  Scientific laboratory technician Use: Never used  Substance and Sexual Activity   Alcohol use: Not Currently   Drug use: Not Currently   Sexual activity: Not Currently  Other Topics Concern   Not on file  Social History Narrative   Not on file   Social Determinants of Health   Financial Resource Strain: Not on file  Food Insecurity: No Food Insecurity   Worried About Charity fundraiser in the Last  Year: Never true   [place] in the Last Year: Never true  Transportation Needs: No Transportation Needs   Lack of Transportation (Medical): No   Lack of Transportation (Non-Medical): No  Physical Activity: Not on file  Stress: Not on file  Social Connections: Not on file  Intimate Partner Violence: Not on file     Allergies  Allergen Reactions   Ace Inhibitors Rash and Other (See Comments)    Make pt bleed   Aspirin Other (See Comments)    Per patient paperwork: blood clot?  Likely because of chronic DOAC   Hydromorphone Hives and Itching   Vancomycin Itching and Rash   Contrast Media [Iodinated Diagnostic Agents] Hives   Dilaudid [Hydromorphone Hcl] Hives     Outpatient Medications Prior to Visit  Medication Sig Dispense Refill   Accu-Chek Softclix Lancets lancets Use as instructed 100 each 12   acetaminophen (TYLENOL) 500 MG tablet Take 1 tablet (500 mg total) by mouth every 6 (six) hours as needed. 60 tablet 0   amLODipine (NORVASC) 5 MG tablet Take 0.5 tablets (2.5 mg total) by mouth daily. 30 tablet 2   apixaban (ELIQUIS) 5 MG TABS tablet Take 1 tablet (5 mg total) by mouth 2 (two) times daily. 60 tablet 3   atorvastatin (LIPITOR) 40 MG tablet Take 1 tablet (40 mg total) by mouth daily. 90 tablet 1   Blood Glucose Monitoring Suppl (ACCU-CHEK GUIDE) w/Device KIT 1 each by Does not apply route 2 (two) times daily. 1 kit 0   Carboxymethylcellulose Sodium (EYE DROPS OP) Place 1 drop into both eyes daily as needed (dry eyes).     carvedilol (COREG) 25 MG tablet TAKE ONE TABLET BY MOUTH TWICE A DAY WITH MEALS 60 tablet 0   cholecalciferol (VITAMIN D3) 25 MCG (1000 UNIT) tablet Take 1 tablet (1,000 Units total) by mouth daily. 30 tablet 5   dapagliflozin propanediol (FARXIGA) 10 MG TABS tablet Take 1 tablet (10 mg total) by mouth daily. 30 tablet 0   furosemide (LASIX) 80 MG tablet Take 1 tablet (80 mg total) by mouth daily. 30 tablet 0   gabapentin (NEURONTIN) 100 MG capsule  Take 1 capsule (100 mg total) by mouth 3 (three) times daily as needed (pain). 90 capsule 2   glucose blood test strip Use as instructed 100 each 12   hydrocortisone cream 1 % Apply 1 application topically daily as needed for itching.     KLOR-CON M20 20 MEQ tablet TAKE ONE TABLET BY MOUTH DAILY 30 tablet 0   methocarbamol (ROBAXIN) 500 MG tablet TAKE ONE TABLET BY MOUTH DAILY AS NEEDED FOR MUSCLE SPASMS 60 tablet 0   mometasone-formoterol (DULERA)  200-5 MCG/ACT AERO Inhale 2 puffs into the lungs 2 (two) times daily. 1 each 2   nitroGLYCERIN (NITROSTAT) 0.4 MG SL tablet Place 1 tablet (0.4 mg total) under the tongue every 5 (five) minutes as needed for chest pain. 30 tablet 0   omeprazole (PRILOSEC) 20 MG capsule Take 20 mg by mouth daily as needed (acid reflux).     PARoxetine (PAXIL) 10 MG tablet TAKE ONE TABLET BY MOUTH DAILY 30 tablet 0   spironolactone (ALDACTONE) 25 MG tablet TAKE ONE TABLET BY MOUTH DAILY 30 tablet 0   traMADol (ULTRAM) 50 MG tablet Take 1 tablet (50 mg total) by mouth every 6 (six) hours as needed for moderate pain. 14 tablet 0   valsartan (DIOVAN) 80 MG tablet Take 1 tablet (80 mg total) by mouth daily. 90 tablet 3   vitamin B-12 (CYANOCOBALAMIN) 1000 MCG tablet Take 1,000 mcg by mouth daily.     albuterol (PROAIR HFA) 108 (90 Base) MCG/ACT inhaler INHALE TWO PUFFS BY MOUTH EVERY 6 HOURS AS NEEDED FOR WHEEZING OR SHORTNESS OF BREATH (Patient not taking: Reported on 12/06/2020) 8.5 g 0   doxycycline (VIBRA-TABS) 100 MG tablet Take 1 tablet (100 mg total) by mouth 2 (two) times daily. (Patient not taking: Reported on 12/06/2020) 20 tablet 0   No facility-administered medications prior to visit.       Objective:   Physical Exam:  General appearance: 50 y.o., female, NAD, conversant  Eyes: anicteric sclerae, moist conjunctivae; no lid-lag; PERRL, tracking appropriately HENT: NCAT; oropharynx, MMM, no mucosal ulcerations; normal hard and soft palate.?Nasal polyp L  nare. Neck: Trachea midline; no lymphadenopathy, no JVD Lungs: Faint crackles in bases, no wheeze, with normal respiratory effort CV: RRR, no MRGs  Abdomen: Soft, non-tender, obese; non-distended, BS present  Extremities: 1+ BLE edema, warm Skin: Normal temperature, turgor and texture; no rash Psych: Appropriate affect Neuro: Alert and oriented to person and place, no focal deficit    Vitals:   12/06/20 1328  BP: 124/86  Pulse: 93  SpO2: 98%  Weight: (!) 329 lb (149.2 kg)  Height: $Remove'5\' 6"'dqSmikv$  (1.676 m)   98% on RA BMI Readings from Last 3 Encounters:  12/06/20 53.10 kg/m  11/29/20 54.78 kg/m  11/27/20 57.11 kg/m   Wt Readings from Last 3 Encounters:  12/06/20 (!) 329 lb (149.2 kg)  11/29/20 (!) 339 lb 6.4 oz (154 kg)  11/27/20 (!) 353 lb 13.4 oz (160.5 kg)     CBC    Component Value Date/Time   WBC 8.6 11/29/2020 1445   WBC 11.1 (H) 11/25/2020 0425   RBC 4.10 11/29/2020 1445   RBC 3.76 (L) 11/25/2020 0425   HGB 10.9 (L) 11/29/2020 1445   HCT 36.1 11/29/2020 1445   PLT 165 11/29/2020 1445   MCV 88 11/29/2020 1445   MCH 26.6 11/29/2020 1445   MCH 27.7 11/25/2020 0425   MCHC 30.2 (L) 11/29/2020 1445   MCHC 30.5 11/25/2020 0425   RDW 13.1 11/29/2020 1445   LYMPHSABS 0.5 (L) 11/29/2020 1445   MONOABS 0.6 11/25/2020 0425   EOSABS 0.0 11/29/2020 1445   BASOSABS 0.0 11/29/2020 1445      Chest Imaging: 11/23/20 CTA Chest reviewed by me with no PE. Prominent azygos which might be related to permcath vs PH. Positive egg and banana sign which could indicate PH. Large heart.  Pulmonary Functions Testing Results: No flowsheet data found.   Echocardiogram:  TTE 11/25/20: 1. Left ventricular ejection fraction, by estimation, is 55 to 60%.  The  left ventricle has normal function. The left ventricle has no regional  wall motion abnormalities. There is mild concentric left ventricular  hypertrophy. Left ventricular diastolic  parameters are consistent with Grade I  diastolic dysfunction (impaired  relaxation).   2. Right ventricular systolic function is normal. The right ventricular  size is normal. There is normal pulmonary artery systolic pressure.   3. The mitral valve is normal in structure. No evidence of mitral valve  regurgitation. No evidence of mitral stenosis.   4. The aortic valve is tricuspid. Aortic valve regurgitation is not  visualized. No aortic stenosis is present.   5. The inferior vena cava is normal in size with <50% respiratory  variability, suggesting right atrial pressure of 8 mmHg.   Heart Catheterization: None    Assessment & Plan:   # DOE: likely multifactorial with deconditioning, asthma, volume overload/CHF playing roles.   # Uncontrolled persistent asthma:   # OSA: high probability co-morbid OHS. Will likely need in-lab testing given anticipated severity, co-morbid CHF. Was previously on CPAP via oronasal mask that she tolerated well - last tested  Plan: - spiro pre/post BD, instructed to hold dulera for 1-2 days before spirometry unless she has deterioration while holding dulera in which case she can resume - continue dulera 200 2 puffs twice daily, rinse mouth afterward - albuterol prn - vbg - likely plan to order in lab split night PSG with PAP titration +/- TCO2 depending on vbg result      Maryjane Hurter, MD [facility] 12/06/2020 2:14 PM

## 2020-12-06 ENCOUNTER — Ambulatory Visit (INDEPENDENT_AMBULATORY_CARE_PROVIDER_SITE_OTHER): Payer: Medicaid Other | Admitting: Student

## 2020-12-06 ENCOUNTER — Encounter: Payer: Self-pay | Admitting: Student

## 2020-12-06 ENCOUNTER — Other Ambulatory Visit: Payer: Self-pay | Admitting: Internal Medicine

## 2020-12-06 VITALS — BP 124/86 | HR 93 | Ht 66.0 in | Wt 329.0 lb

## 2020-12-06 DIAGNOSIS — J45998 Other asthma: Secondary | ICD-10-CM | POA: Diagnosis not present

## 2020-12-06 DIAGNOSIS — G4733 Obstructive sleep apnea (adult) (pediatric): Secondary | ICD-10-CM | POA: Diagnosis not present

## 2020-12-06 DIAGNOSIS — F411 Generalized anxiety disorder: Secondary | ICD-10-CM

## 2020-12-06 DIAGNOSIS — I10 Essential (primary) hypertension: Secondary | ICD-10-CM

## 2020-12-06 DIAGNOSIS — I5032 Chronic diastolic (congestive) heart failure: Secondary | ICD-10-CM

## 2020-12-06 NOTE — Patient Instructions (Addendum)
-   Keep using dulera 2 puffs twice daily, rinse mouth out afterward - Head to the lab today to help [place] figure out what kind of sleep study to order - Breathing tests in a month or so (PFTs). If you can maintain ok control of your asthma, would stop your dulera for 1-2 days before your PFTs and then resume dulera. If your breathing worsens while you've stopped dulera you can feel free to resume it. - weigh yourself daily and if 3 pounds over 329 in a day or 5 pounds over 329 in a week then take an extra dose of lasix in the afternoon until you get back to goal weight. - see you in 6 months!

## 2020-12-07 ENCOUNTER — Encounter (HOSPITAL_BASED_OUTPATIENT_CLINIC_OR_DEPARTMENT_OTHER): Payer: Medicaid Other | Admitting: Internal Medicine

## 2020-12-07 NOTE — Progress Notes (Signed)
[name], Megan ([id number]) Visit Report for 12/05/2020 SuperBill Details Patient Name: Date of Service: [name], Megan 12/05/2020 Medical Record Number: [id number] Patient Account Number: [id number] Date of Birth/Sex: Treating RN: November 16, 1970 (50 y.o. [name] Primary Care Provider: [name] Other Clinician: Referring Provider: Treating Provider/Extender: Randon Goldsmith in Treatment: 6 Diagnosis Coding ICD-10 Codes Code Description [phone] Non-pressure chronic ulcer of other part of left lower leg with unspecified severity I87.2 Venous insufficiency (chronic) (peripheral) I89.0 Lymphedema, not elsewhere classified Z86.718 Personal history of other venous thrombosis and embolism Facility Procedures CPT4 Code Description Modifier Quantity [id number] (Facility Use Only) [phone] - [facility] [id number] LWR LT LEG 1 ICD-10 Diagnosis Description L97.829 Non-pressure chronic ulcer of other part of left lower leg with unspecified severity I89.0 Lymphedema, not elsewhere classified Electronic Signature(s) Signed: 12/05/2020 8:57:26 AM By: [name] Signed: 12/07/2020 5:47:57 PM By: Worthy Keeler PA-C Entered By: [name] on 12/05/2020 08:57:25

## 2020-12-10 ENCOUNTER — Other Ambulatory Visit: Payer: Self-pay | Admitting: Internal Medicine

## 2020-12-10 DIAGNOSIS — I5032 Chronic diastolic (congestive) heart failure: Secondary | ICD-10-CM

## 2020-12-10 DIAGNOSIS — I10 Essential (primary) hypertension: Secondary | ICD-10-CM

## 2020-12-10 NOTE — Telephone Encounter (Signed)
Pt has picked up prescription 30 day refill. Pt is requesting 90 day refills from now on.

## 2020-12-11 NOTE — Progress Notes (Signed)
You're welcome to head to any of [facility] labs to get a blood draw that I ordered during our last visit. This test will help guide the best choice of sleep study for you. Send me a message or call [phone] if you have any questions about it.

## 2020-12-12 ENCOUNTER — Telehealth: Payer: Self-pay | Admitting: Internal Medicine

## 2020-12-12 NOTE — Telephone Encounter (Signed)
Copied from [place] [id number]. Topic: General - Other >> Dec 11, 2020 12:47 PM Tessa Lerner A wrote: Reason for CRM: Patient would like to be contacted regarding prescription coordination   The patient shares that they need to reorder certain medications but was unsure of the names of the prescriptions at the time of call   The patient shares that the medications are related to their blood pressure  Please contact further when possible

## 2020-12-13 ENCOUNTER — Other Ambulatory Visit: Payer: Self-pay | Admitting: Physician Assistant

## 2020-12-13 ENCOUNTER — Telehealth: Payer: Self-pay | Admitting: Internal Medicine

## 2020-12-13 MED ORDER — ACCU-CHEK GUIDE CONTROL VI LIQD: 1.0000 | Freq: Every day | 4 refills | Status: DC

## 2020-12-13 NOTE — Telephone Encounter (Signed)
Their is no message below.

## 2020-12-13 NOTE — Telephone Encounter (Signed)
Rx sent to pharmacy at [facility] on 12/06/2020. Should check pharmacy

## 2020-12-14 ENCOUNTER — Other Ambulatory Visit: Payer: Self-pay

## 2020-12-14 ENCOUNTER — Encounter (HOSPITAL_BASED_OUTPATIENT_CLINIC_OR_DEPARTMENT_OTHER): Payer: Medicaid Other | Admitting: Internal Medicine

## 2020-12-14 DIAGNOSIS — L97829 Non-pressure chronic ulcer of other part of left lower leg with unspecified severity: Secondary | ICD-10-CM | POA: Diagnosis not present

## 2020-12-15 NOTE — Telephone Encounter (Signed)
See encounter

## 2020-12-15 NOTE — Telephone Encounter (Signed)
Megan Billings do you have a prior auth for patient

## 2020-12-15 NOTE — Telephone Encounter (Signed)
Patient calling back checking on the status of prior authorization for dapagliflozin propanediol (FARXIGA) 10 MG TABS tablet. Patient states she really needs this medication. As per pharmacist Estill Bamberg, [place] was initiated in cover my meds and awaiting for  PCP response. Patient would like a follow up call as soon as possible best #  [phone]    [id number] - [place] Phone:  [phone]  Fax:  [phone]

## 2020-12-15 NOTE — Progress Notes (Signed)
[name], [name] ([id number]) Visit Report for 12/14/2020 Arrival Information Details Patient Name: Date of Service: [name], [name] 12/14/2020 3:30 [name] Medical Record Number: [id number] Patient Account Number: [id number] Date of Birth/Sex: Treating RN: 1970-08-13 (50 y.o. [name] Primary Care [name]: [name] Other Clinician: Donavan [name] Referring [name]: Treating [name]/Extender: [name] in Treatment: 7 Visit Information History Since Last Visit All ordered tests and consults were completed: Yes Patient Arrived: Ambulatory Added or deleted any medications: No Arrival Time: 15:45 Any new allergies or adverse reactions: No Accompanied By: self Had a fall or experienced change in No Transfer Assistance: None activities of daily living that may affect Patient Identification Verified: Yes risk of falls: Secondary Verification Process Completed: Yes Signs or symptoms of abuse/neglect since last visito No Patient Requires Transmission-Based Precautions: No Hospitalized since last visit: No Patient Has Alerts: Yes Implantable device outside of the clinic excluding No Patient Alerts: ABI's: 11/21 L:1.17 cellular tissue based products placed in the center since last visit: Pain Present Now: Yes Electronic Signature(s) Signed: 12/14/2020 5:51:06 [name] By: [name] Entered By: [name] on 12/14/2020 15:51:33 -------------------------------------------------------------------------------- Compression Therapy Details Patient Name: Date of Service: [name] Medical Record Number: [id number] Patient Account Number: [id number] Date of Birth/Sex: Treating RN: 22-Oct-1970 (50 y.o. [name], [name] Primary Care [name]: [name] Other Clinician: Referring Harbor Paster: Treating Takisha Pelle/Extender: [name] in Treatment: 7 Compression Therapy  Performed for Wound Assessment: Wound #4 Left,Medial Lower Leg Performed By: Clinician Rhae Hammock, RN Compression Type: Four Layer Post Procedure Diagnosis Same as Pre-procedure Electronic Signature(s) Signed: 12/14/2020 5:10:54 [name] By: Rhae Hammock RN Entered By: Rhae Hammock on 12/14/2020 17:06:32 -------------------------------------------------------------------------------- Encounter Discharge Information Details Patient Name: Date of Service: [name] Medical Record Number: [id number] Patient Account Number: [id number] Date of Birth/Sex: Treating RN: Aug 19, 1970 (50 y.o. [name], [name] Primary Care Kehinde Bowdish: [name] Other Clinician: Referring [name]: Treating [name]/Extender: [name] in Treatment: 7 Encounter Discharge Information Items Discharge Condition: Stable Ambulatory Status: Ambulatory Discharge Destination: Home Transportation: Private Auto Accompanied By: self Schedule Follow-up Appointment: Yes Clinical Summary of Care: Patient Declined Electronic Signature(s) Signed: 12/14/2020 5:10:54 [name] By: Rhae Hammock RN Entered By: Rhae Hammock on 12/14/2020 17:07:01 -------------------------------------------------------------------------------- Lower Extremity Assessment Details Patient Name: Date of Service: [name] Medical Record Number: [id number] Patient Account Number: [id number] Date of Birth/Sex: Treating RN: 10-Aug-1970 (50 y.o. [name] Primary Care Mame Twombly: [name] Other Clinician: Referring [name]: Treating [name]/Extender: [name] in Treatment: 7 Edema Assessment Assessed: [name]: Yes] [Right: No] Edema: [Left: Ye] [Right: s] Calf Left: Right: Point of Measurement: 25 cm From Medial Instep 43.4 cm Ankle Left: Right: Point of Measurement: 9 cm From Medial  Instep 24.6 cm Vascular Assessment Pulses: Dorsalis Pedis Palpable: [Left:Yes] Electronic Signature(s) Signed: 12/14/2020 5:51:06 [name] By: [name] Entered By: [name] on 12/14/2020 15:59:15 -------------------------------------------------------------------------------- Multi Wound Chart Details Patient Name: Date of Service: [name], [name] 12/14/2020 3:30 [name] Medical Record Number: [id number] Patient Account Number: [id number] Date of Birth/Sex: Treating RN: [birth year] (50 y.o. [name], [name] Primary Care Daxen Lanum: [name] Other Clinician: Referring Lamyah Creed: Treating [name]/Extender: [name] in Treatment: 7 Vital Signs Height(in): 67 Pulse(bpm): 86 Weight(lbs): 346 Blood Pressure(mmHg): 124/75 Body Mass Index(BMI): 54 Temperature(F): 97.9 Respiratory Rate(breaths/min): 18 Photos: [4:No Photos Left, Medial Lower Leg] [  5:No Photos Left Gluteus] [6:No Photos Right Gluteus] Wound Location: [4:Gradually Appeared] [5:Pressure Injury] [6:Pressure Injury] Wounding Event: [4:Venous Leg Ulcer] [5:Pressure Ulcer] [6:Pressure Ulcer] Primary Etiology: [4:Sleep Apnea, Congestive Heart] [5:Sleep Apnea, Congestive Heart] [6:Sleep Apnea, Congestive Heart] Comorbid History: [4:Failure, Coronary Artery Disease, Hypertension 09/26/2020] [5:Failure, Coronary Artery Disease, Hypertension 11/30/2020] [6:Failure, Coronary Artery Disease, Hypertension 11/30/2020] Date Acquired: [4:7] [5:2] [6:2] Weeks of Treatment: [4:Open] [5:Open] [6:Open] Wound Status: [4:2.3x2.5x0.1] [5:0x0x0] [6:0x0x0] Measurements L x W x D (cm) [4:4.516] [5:0] [6:0] A (cm) : rea [4:0.452] [5:0] [6:0] Volume (cm) : [4:72.20%] [5:100.00%] [6:100.00%] % Reduction in Area: [4:72.20%] [5:100.00%] [6:100.00%] % Reduction in Volume: [4:Full Thickness Without Exposed] [5:Category/Stage II] [6:Category/Stage II] Classification: [4:Support Structures Medium] [5:N/A]  [6:N/A] Exudate Amount: [4:Serosanguineous] [5:N/A] [6:N/A] Exudate Type: [4:red, brown] [5:N/A] [6:N/A] Exudate Color: [4:Distinct, outline attached] [5:N/A] [6:N/A] Wound Margin: [4:Large (67-100%)] [5:N/A] [6:N/A] Granulation Amount: [4:Red] [5:N/A] [6:N/A] Granulation Quality: [4:None Present (0%)] [5:N/A] [6:N/A] Necrotic Amount: [4:Fat Layer (Subcutaneous Tissue): Yes N/A] [6:N/A] Exposed Structures: [4:Fascia: No Tendon: No Muscle: No Joint: No Bone: No Medium (34-66%)] [5:N/A] [6:N/A] Treatment Notes Electronic Signature(s) Signed: 12/14/2020 5:10:54 [name] By: Rhae Hammock RN Signed: 12/15/2020 9:06:35 AM By: [name] DO Entered By: [name] on 12/14/2020 17:05:44 -------------------------------------------------------------------------------- Multi-Disciplinary Care Plan Details Patient Name: Date of Service: [name], [name] 12/14/2020 3:30 [name] Medical Record Number: [id number] Patient Account Number: [id number] Date of Birth/Sex: Treating RN: Aug 14, 1970 (50 y.o. [name] Primary Care Rayann Jolley: [name] Other Clinician: Referring Seraj Dunnam: Treating Artha Chiasson/Extender: [name] in Treatment: 7 Multidisciplinary Care Plan reviewed with physician Active Inactive Venous Leg Ulcer Nursing Diagnoses: Actual venous Insuffiency (use after diagnosis is confirmed) Knowledge deficit related to disease process and management Potential for venous Insuffiency (use before diagnosis confirmed) Goals: Patient will maintain optimal edema control Date Initiated: 10/20/2020 Target Resolution Date: 12/29/2020 Goal Status: Active Interventions: Assess peripheral edema status every visit. Compression as ordered Treatment Activities: Therapeutic compression applied : 10/20/2020 Notes: Wound/Skin Impairment Nursing Diagnoses: Impaired tissue integrity Knowledge deficit related to ulceration/compromised skin  integrity Goals: Patient/caregiver will verbalize understanding of skin care regimen Date Initiated: 10/20/2020 Target Resolution Date: 12/29/2020 Goal Status: Active Ulcer/skin breakdown will have a volume reduction of 30% by week 4 Date Initiated: 10/20/2020 Date Inactivated: 12/14/2020 Target Resolution Date: 11/24/2020 Goal Status: Met Ulcer/skin breakdown will have a volume reduction of 80% by week 12 Date Initiated: 12/14/2020 Target Resolution Date: 01/18/2021 Goal Status: Active Interventions: Assess patient/caregiver ability to obtain necessary supplies Assess patient/caregiver ability to perform ulcer/skin care regimen upon admission and as needed Assess ulceration(s) every visit Provide education on ulcer and skin care Treatment Activities: Skin care regimen initiated : 10/20/2020 Topical wound management initiated : 10/20/2020 Notes: Electronic Signature(s) Signed: 12/14/2020 5:51:06 [name] By: [name] Entered By: [name] on 12/14/2020 16:10:17 -------------------------------------------------------------------------------- Pain Assessment Details Patient Name: Date of Service: [name] Medical Record Number: [id number] Patient Account Number: [id number] Date of Birth/Sex: Treating RN: [birth year] (50 y.o. [name] Primary Care Quint Chestnut: [name] Other Clinician: Referring Vaniah Chambers: Treating Bentleigh Waren/Extender: [name] in Treatment: 7 Active Problems Location of Pain Severity and Description of Pain Patient Has Paino Yes Site Locations Site Locations Pain Location: Pain in Ulcers With Dressing Change: Yes Duration of the Pain. Constant / Intermittento Intermittent Rate the pain. Current Pain Level: 8 Character of Pain Describe the Pain: Aching, Tender Pain Management and Medication Current Pain Management: Medication: Yes Cold Application:  No Rest: Yes Massage:  No Activity: No T.E.N.S.: No Heat Application: No Leg drop or elevation: No Is the Current Pain Management Adequate: Inadequate How does your wound impact your activities of daily livingo Sleep: Yes Bathing: No Appetite: No Relationship With Others: No Bladder Continence: No Emotions: No Bowel Continence: No Work: No Toileting: No Drive: No Dressing: No Hobbies: No Electronic Signature(s) Signed: 12/14/2020 5:51:06 [name] By: [name] Entered By: [name] on 12/14/2020 15:56:40 -------------------------------------------------------------------------------- Patient/Caregiver Education Details Patient Name: Date of Service: Megan Session D. 8/18/2022andnbsp3:30 [name] Medical Record Number: [id number] Patient Account Number: [id number] Date of Birth/Gender: Treating RN: 04/17/1971 (50 y.o. [name] Primary Care Physician: [name] Other Clinician: Referring Physician: Treating Physician/Extender: [name] in Treatment: 7 Education Assessment Education Provided To: Patient Education Topics Provided Offloading: Methods: Explain/Verbal, Printed Responses: State content correctly Venous: Methods: Explain/Verbal, Printed Responses: State content correctly Wound/Skin Impairment: Methods: Explain/Verbal, Printed Responses: State content correctly Electronic Signature(s) Signed: 12/14/2020 5:51:06 [name] By: [name] Entered By: [name] on 12/14/2020 16:32:34 -------------------------------------------------------------------------------- Wound Assessment Details Patient Name: Date of Service: [name] Medical Record Number: [id number] Patient Account Number: [id number] Date of Birth/Sex: Treating RN: 1971/04/15 (50 y.o. [name] Primary Care Rahel Carlton: [name] Other Clinician: Referring Doryce Mcgregory: Treating Navika Hoopes/Extender: [name] in Treatment: 7 Wound Status Wound Number: 4 Primary Venous Leg Ulcer Etiology: Wound Location: Left, Medial Lower Leg Wound Open Wounding Event: Gradually Appeared Status: Date Acquired: 09/26/2020 Comorbid Sleep Apnea, Congestive Heart Failure, Coronary Artery Weeks Of Treatment: 7 History: Disease, Hypertension Clustered Wound: No Wound Measurements Length: (cm) 2.3 Width: (cm) 2.5 Depth: (cm) 0.1 Area: (cm) 4.516 Volume: (cm) 0.452 % Reduction in Area: 72.2% % Reduction in Volume: 72.2% Epithelialization: Medium (34-66%) Tunneling: No Undermining: No Wound Description Classification: Full Thickness Without Exposed Support Structures Wound Margin: Distinct, outline attached Exudate Amount: Medium Exudate Type: Serosanguineous Exudate Color: red, brown Foul Odor After Cleansing: No Slough/Fibrino No Wound Bed Granulation Amount: Large (67-100%) Exposed Structure Granulation Quality: Red Fascia Exposed: No Necrotic Amount: None Present (0%) Fat Layer (Subcutaneous Tissue) Exposed: Yes Tendon Exposed: No Muscle Exposed: No Joint Exposed: No Bone Exposed: No Treatment Notes Wound #4 (Lower Leg) Wound Laterality: Left, Medial Cleanser Soap and Water Discharge Instruction: May shower and wash wound with dial antibacterial soap and water prior to dressing change. Peri-Wound Care Topical Primary Dressing Hydrofera Blue Ready Foam, 4x5 in Discharge Instruction: Apply to wound bed as instructed Santyl Ointment Discharge Instruction: Apply nickel thick amount to wound bed as instructed Secondary Dressing Woven Gauze Sponge, Non-Sterile 4x4 in Discharge Instruction: Apply over primary dressing as directed. ABD Pad, 8x10 Discharge Instruction: Apply over primary dressing as directed. CarboFLEX Odor Control Dressing, 4x4 in Discharge Instruction: Apply over primary dressing as directed. Secured With Compression Wrap Unnaboot w/Calamine, 4x10  (in/yd) Discharge Instruction: unna at top of wrap to help secure CoFlex TLC XL 2-layer Compression System 4x7 (in/yd) Discharge Instruction: Apply CoFlex 2-layer compression as directed. (alt for 4 layer) Compression Stockings Add-Ons Electronic Signature(s) Signed: 12/14/2020 5:51:06 [name] By: [name] Entered By: [name] on 12/14/2020 16:06:01 -------------------------------------------------------------------------------- Wound Assessment Details Patient Name: Date of Service: [name] Medical Record Number: [id number] Patient Account Number: [id number] Date of Birth/Sex: Treating RN: May 12, 1970 (50 y.o. [name] Primary Care Linn Clavin: [name] Other Clinician: Referring Daeja Helderman: Treating Cyrstal Leitz/Extender: [name] in  Treatment: 7 Wound Status Wound Number: 5 Primary Pressure Ulcer Etiology: Wound Location: Left Gluteus Wound Open Wounding Event: Pressure Injury Status: Date Acquired: 11/30/2020 Comorbid Sleep Apnea, Congestive Heart Failure, Coronary Artery Weeks Of Treatment: 2 History: Disease, Hypertension Clustered Wound: No Wound Measurements Length: (cm) Width: (cm) Depth: (cm) Area: (cm) Volume: (cm) 0 % Reduction in Area: 100% 0 % Reduction in Volume: 100% 0 0 0 Wound Description Classification: Category/Stage II Electronic Signature(s) Signed: 12/14/2020 5:51:06 [name] By: [name] Entered By: [name] on 12/14/2020 16:09:01 -------------------------------------------------------------------------------- Wound Assessment Details Patient Name: Date of Service: [name] Medical Record Number: [id number] Patient Account Number: [id number] Date of Birth/Sex: Treating RN: 1970/09/02 (50 y.o. [name] Primary Care Temekia Caskey: [name] Other Clinician: Referring Naphtali Riede: Treating Kenisha Lynds/Extender: [name] in Treatment: 7 Wound Status Wound Number: 6 Primary Pressure Ulcer Etiology: Wound Location: Right Gluteus Wound Open Wounding Event: Pressure Injury Status: Date Acquired: 11/30/2020 Comorbid Sleep Apnea, Congestive Heart Failure, Coronary Artery Weeks Of Treatment: 2 History: Disease, Hypertension Clustered Wound: No Wound Measurements Length: (cm) Width: (cm) Depth: (cm) Area: (cm) Volume: (cm) 0 % Reduction in Area: 100% 0 % Reduction in Volume: 100% 0 0 0 Wound Description Classification: Category/Stage II Electronic Signature(s) Signed: 12/14/2020 5:51:06 [name] By: [name] Entered By: [name] on 12/14/2020 16:09:20 -------------------------------------------------------------------------------- Vitals Details Patient Name: Date of Service: [name], [name] 12/14/2020 3:30 [name] Medical Record Number: [id number] Patient Account Number: [id number] Date of Birth/Sex: Treating RN: [birth year] (50 y.o. [name] Primary Care Yarelin Reichardt: [name] Other Clinician: Referring Darrion Wyszynski: Treating Erina Hamme/Extender: [name] in Treatment: 7 Vital Signs Time Taken: 15:52 Temperature (F): 97.9 Height (in): 67 Pulse (bpm): 86 Weight (lbs): 346 Respiratory Rate (breaths/min): 18 Body Mass Index (BMI): 54.2 Blood Pressure (mmHg): 124/75 Reference Range: 80 - 120 mg / dl Electronic Signature(s) Signed: 12/14/2020 5:51:06 [name] By: [name] Entered By: [name] on 12/14/2020 15:52:35

## 2020-12-15 NOTE — Progress Notes (Signed)
SACORA, ARORA ([id number]) Visit Report for 12/14/2020 Chief Complaint Document Details Patient Name: Date of Service: [name], [name]. 12/14/2020 3:30 PM Medical Record Number: [id number] Patient Account Number: [id number] Date of Birth/Sex: Treating RN: 1970/06/30 (50 y.o. [name], [name] Primary Care Provider: [name] Other Clinician: Referring Provider: Treating Provider/Extender: Sammuel Bailiff in Treatment: 7 Information Obtained from: Patient Chief Complaint Left lower extremity wound Electronic Signature(s) Signed: 12/15/2020 9:06:35 AM By: [name] DO Entered By: [name] on 12/14/2020 17:06:39 -------------------------------------------------------------------------------- HPI Details Patient Name: Date of Service: [name], [name]. 12/14/2020 3:30 PM Medical Record Number: [id number] Patient Account Number: [id number] Date of Birth/Sex: Treating RN: 08/22/1970 (50 y.o. [name] Primary Care Provider: [name] Other Clinician: Referring Provider: Treating Provider/Extender: Sammuel Bailiff in Treatment: 7 History of Present Illness HPI Description: Admission 6/24 [name] with a past medical history of chronic venous insufficiency, lymphedema, DVT on anticoagulation and diastolic heart failure that presents to the clinic for left lower extremity wound. She was last seen 4 months ago in our clinic for the same issue. The reoccurring wound started at the end of May and has been going on for 1 month. She has been using an ointment and I am unclear what this is. She tries to keep her leg elevated with her compression stocking. She also reports she has lymphedema pumps and has been using them as well. She reports mild pain to the area. She denies signs of infection including increased warmth, erythema or purulent drainage. 7/1; patient  presents for 1 week follow-up. She has tolerated the wrap well. Unfortunately she did have trouble with the wrap Sliding down her leg 2 days ago. She has no issues or complaints today. She denies signs of infection. 7/15; patient presents for follow-up. She has tolerated the wrap well. She has no issues or complaints today. She denies signs of infection. 7/25; patient presents for 1 week follow-up. She has no issues or complaints today. She denies signs of infection. 8/4; patient presents for follow-up. She has tolerated the compression wrap well to her left lower extremity. She states she was in the hospital last week due to fluid overload. She subsequently developed blisters to her bottom from being in the hospital bed for prolonged periods of time. These have since ruptured. She now has 2 areas of skin breakdown. She has been keeping gauze on them. She denies signs of infection. 8/18; patient presents for 2-week follow-up. She reports improvement to her buttocks wounds. She is able to tolerate the wrap well to her left lower extremity with no issues. She denies signs of infection. Electronic Signature(s) Signed: 12/15/2020 9:06:35 AM By: [name] DO Entered By: [name] on 12/14/2020 17:07:14 -------------------------------------------------------------------------------- Physical Exam Details Patient Name: Date of Service: Megan Session [name]. 12/14/2020 3:30 PM Medical Record Number: [id number] Patient Account Number: [id number] Date of Birth/Sex: Treating RN: Mar 07, 1971 (51 y.o. [name], [name] Primary Care Provider: [name] Other Clinician: Referring Provider: Treating Provider/Extender: Sammuel Bailiff in Treatment: 7 Constitutional respirations regular, non-labored and within target range for patient.[name] Psychiatric pleasant and cooperative. Notes Left lower extremity: On the medial aspect there is an open wound with granulation  tissue present. No signs of infection. Good edema control. The right and left buttocks have epithelialization over the previous wound sites. Electronic Signature(s) Signed: 12/15/2020 9:06:35 AM By: [name] DO Entered By: [name]  on 12/14/2020 17:08:28 -------------------------------------------------------------------------------- Physician Orders Details Patient Name: Date of Service: [name]. 12/14/2020 3:30 PM Medical Record Number: [id number] Patient Account Number: [id number] Date of Birth/Sex: Treating RN: 1970/05/16 (50 y.o. [name], [name] Primary Care Provider: [name] Other Clinician: Referring Provider: Treating Provider/Extender: Sammuel Bailiff in Treatment: 7 Verbal / Phone Orders: No Diagnosis Coding ICD-10 Coding Code Description [phone] Non-pressure chronic ulcer of other part of left lower leg with unspecified severity I87.2 Venous insufficiency (chronic) (peripheral) I89.0 Lymphedema, not elsewhere classified Z86.718 Personal history of other venous thrombosis and embolism Follow-up Appointments ppointment in 2 weeks. - Dr. Heber [place] Return A Nurse Visit: - 1 week for rewrap Bathing/ Shower/ Hygiene May shower with protection but do not get wound dressing(s) wet. - use cast protector to keep wrap dry in the shower Edema Control - Lymphedema / SCD / Other Bilateral Lower Extremities Lymphedema Pumps. Use Lymphedema pumps on leg(s) 2-3 times a day for 45-60 minutes. If wearing any wraps or hose, do not remove them. Continue exercising as instructed. Elevate legs to the level of the heart or above for 30 minutes daily and/or when sitting, a frequency of: Avoid standing for long periods of time. Patient to wear own compression stockings every day. - right leg daily Exercise regularly Off-Loading Turn and reposition every 2 hours - Ensure not to apply pressure to these areas. Wound Treatment Wound  #4 - Lower Leg Wound Laterality: Left, Medial Cleanser: Soap and Water 1 x Per Week/15 Days Discharge Instructions: May shower and wash wound with dial antibacterial soap and water prior to dressing change. Prim Dressing: Hydrofera Blue Ready Foam, 4x5 in 1 x Per Week/15 Days ary Discharge Instructions: Apply to wound bed as instructed Prim Dressing: Santyl Ointment 1 x Per Week/15 Days ary Discharge Instructions: Apply nickel thick amount to wound bed as instructed Secondary Dressing: Woven Gauze Sponge, Non-Sterile 4x4 in 1 x Per Week/15 Days Discharge Instructions: Apply over primary dressing as directed. Secondary Dressing: ABD Pad, 8x10 1 x Per Week/15 Days Discharge Instructions: Apply over primary dressing as directed. Secondary Dressing: CarboFLEX Odor Control Dressing, 4x4 in 1 x Per Week/15 Days Discharge Instructions: Apply over primary dressing as directed. Compression Wrap: Unnaboot w/Calamine, 4x10 (in/yd) 1 x Per Week/15 Days Discharge Instructions: unna at top of wrap to help secure Compression Wrap: CoFlex TLC XL 2-layer Compression System 4x7 (in/yd) 1 x Per Week/15 Days Discharge Instructions: Apply CoFlex 2-layer compression as directed. (alt for 4 layer) Electronic Signature(s) Signed: 12/15/2020 9:06:35 AM By: [name] DO Entered By: [name] on 12/14/2020 17:09:16 -------------------------------------------------------------------------------- Problem List Details Patient Name: Date of Service: Megan Session [name]. 12/14/2020 3:30 PM Medical Record Number: [id number] Patient Account Number: [id number] Date of Birth/Sex: Treating RN: [birth year] (50 y.o. [name], [name] Primary Care Provider: [name] Other Clinician: Referring Provider: Treating Provider/Extender: Sammuel Bailiff in Treatment: 7 Active Problems ICD-10 Encounter Code Description Active Date MDM Diagnosis L97.829 Non-pressure chronic ulcer  of other part of left lower leg with unspecified 10/20/2020 No Yes severity I87.2 Venous insufficiency (chronic) (peripheral) 10/20/2020 No Yes I89.0 Lymphedema, not elsewhere classified 10/20/2020 No Yes Z86.718 Personal history of other venous thrombosis and embolism 10/20/2020 No Yes Inactive Problems Resolved Problems Electronic Signature(s) Signed: 12/15/2020 9:06:35 AM By: [name] DO Entered By: [name] on 12/14/2020 17:05:38 -------------------------------------------------------------------------------- Progress Note Details Patient Name: Date of Service: [name], Cathy [name]. 12/14/2020 3:30 PM Medical Record Number: [id number]  Patient Account Number: [id number] Date of Birth/Sex: Treating RN: 1971-04-09 (50 y.o. [name], [name] Primary Care Provider: [name] Other Clinician: Referring Provider: Treating Provider/Extender: Sammuel Bailiff in Treatment: 7 Subjective Chief Complaint Information obtained from Patient Left lower extremity wound History of Present Illness (HPI) Admission 6/24 [name] with a past medical history of chronic venous insufficiency, lymphedema, DVT on anticoagulation and diastolic heart failure that presents to the clinic for left lower extremity wound. She was last seen 4 months ago in our clinic for the same issue. The reoccurring wound started at the end of May and has been going on for 1 month. She has been using an ointment and I am unclear what this is. She tries to keep her leg elevated with her compression stocking. She also reports she has lymphedema pumps and has been using them as well. She reports mild pain to the area. She denies signs of infection including increased warmth, erythema or purulent drainage. 7/1; patient presents for 1 week follow-up. She has tolerated the wrap well. Unfortunately she did have trouble with the wrap Sliding down her leg 2 days  ago. She has no issues or complaints today. She denies signs of infection. 7/15; patient presents for follow-up. She has tolerated the wrap well. She has no issues or complaints today. She denies signs of infection. 7/25; patient presents for 1 week follow-up. She has no issues or complaints today. She denies signs of infection. 8/4; patient presents for follow-up. She has tolerated the compression wrap well to her left lower extremity. She states she was in the hospital last week due to fluid overload. She subsequently developed blisters to her bottom from being in the hospital bed for prolonged periods of time. These have since ruptured. She now has 2 areas of skin breakdown. She has been keeping gauze on them. She denies signs of infection. 8/18; patient presents for 2-week follow-up. She reports improvement to her buttocks wounds. She is able to tolerate the wrap well to her left lower extremity with no issues. She denies signs of infection. Patient History Information obtained from Patient. Family History Diabetes - Mother,Maternal Grandparents, Heart Disease - Paternal Grandparents, Hypertension - Mother,Maternal Grandparents, Lung Disease - Father,Paternal Grandparents, No family history of Cancer, Hereditary Spherocytosis, Kidney Disease, Seizures, Stroke, Thyroid Problems, Tuberculosis. Social History Former smoker, Marital Status - Single, Alcohol Use - Never, Drug Use - No History, Caffeine Use - Daily. Medical History Eyes Denies history of Cataracts, Glaucoma, Optic Neuritis Ear/Nose/Mouth/Throat Denies history of Chronic sinus problems/congestion, Middle ear problems Hematologic/Lymphatic Denies history of Anemia, Hemophilia, Human Immunodeficiency Virus, Lymphedema, Sickle Cell Disease Respiratory Patient has history of Sleep Apnea Denies history of Aspiration, Asthma, Chronic Obstructive Pulmonary Disease (COPD), Pneumothorax, Tuberculosis Cardiovascular Patient has  history of Congestive Heart Failure, Coronary Artery Disease, Hypertension Denies history of Angina, Arrhythmia, Deep Vein Thrombosis, Hypotension, Myocardial Infarction, Peripheral Arterial Disease, Peripheral Venous Disease, Phlebitis, Vasculitis Gastrointestinal Denies history of Cirrhosis , Colitis, Crohnoos, Hepatitis A, Hepatitis B, Hepatitis C Endocrine Denies history of Type I Diabetes, Type II Diabetes Genitourinary Denies history of End Stage Renal Disease Immunological Denies history of Lupus Erythematosus, Raynaudoos, Scleroderma Integumentary (Skin) Denies history of History of Burn Musculoskeletal Denies history of Gout, Rheumatoid Arthritis, Osteoarthritis, Osteomyelitis Neurologic Denies history of Dementia, Neuropathy, Quadriplegia, Paraplegia, Seizure Disorder Oncologic Denies history of Received Chemotherapy, Received Radiation Psychiatric Denies history of Anorexia/bulimia, Confinement Anxiety Objective Constitutional respirations regular, non-labored and within target range for  patient.. Vitals Time Taken: 3:52 PM, Height: 67 in, Weight: 346 lbs, BMI: 54.2, Temperature: 97.9 F, Pulse: 86 bpm, Respiratory Rate: 18 breaths/min, Blood Pressure: 124/75 mmHg. Psychiatric pleasant and cooperative. General Notes: Left lower extremity: On the medial aspect there is an open wound with granulation tissue present. No signs of infection. Good edema control. The right and left buttocks have epithelialization over the previous wound sites. Integumentary (Hair, Skin) Wound #4 status is Open. Original cause of wound was Gradually Appeared. The date acquired was: 09/26/2020. The wound has been in treatment 7 weeks. The wound is located on the Left,Medial Lower Leg. The wound measures 2.3cm length x 2.5cm width x 0.1cm depth; 4.516cm^2 area and 0.452cm^3 volume. There is Fat Layer (Subcutaneous Tissue) exposed. There is no tunneling or undermining noted. There is a medium  amount of serosanguineous drainage noted. The wound margin is distinct with the outline attached to the wound base. There is large (67-100%) red granulation within the wound bed. There is no necrotic tissue within the wound bed. Wound #5 status is Open. Original cause of wound was Pressure Injury. The date acquired was: 11/30/2020. The wound has been in treatment 2 weeks. The wound is located on the Left Gluteus. The wound measures 0cm length x 0cm width x 0cm depth; 0cm^2 area and 0cm^3 volume. Wound #6 status is Open. Original cause of wound was Pressure Injury. The date acquired was: 11/30/2020. The wound has been in treatment 2 weeks. The wound is located on the Right Gluteus. The wound measures 0cm length x 0cm width x 0cm depth; 0cm^2 area and 0cm^3 volume. Assessment Active Problems ICD-10 Non-pressure chronic ulcer of other part of left lower leg with unspecified severity Venous insufficiency (chronic) (peripheral) Lymphedema, not elsewhere classified Personal history of other venous thrombosis and embolism Patient's buttocks wounds have healed. I recommended doing zinc oxide to these areas for the next week. Her left lower extremity wound continues to improve. I recommended continuing Santyl and Hydrofera Blue under compression therapy. No signs of infection on exam. Procedures Wound #4 Pre-procedure diagnosis of Wound #4 is a Venous Leg Ulcer located on the Left,Medial Lower Leg . There was a Four Layer Compression Therapy Procedure by Rhae Hammock, RN. Post procedure Diagnosis Wound #4: Same as Pre-Procedure Plan Follow-up Appointments: Return Appointment in 2 weeks. - Dr. Heber [place] Nurse Visit: - 1 week for rewrap Bathing/ Shower/ Hygiene: May shower with protection but do not get wound dressing(s) wet. - use cast protector to keep wrap dry in the shower Edema Control - Lymphedema / SCD / Other: Lymphedema Pumps. Use Lymphedema pumps on leg(s) 2-3 times a day for 45-60 minutes.  If wearing any wraps or hose, do not remove them. Continue exercising as instructed. Elevate legs to the level of the heart or above for 30 minutes daily and/or when sitting, a frequency of: Avoid standing for long periods of time. Patient to wear own compression stockings every day. - right leg daily Exercise regularly Off-Loading: Turn and reposition every 2 hours - Ensure not to apply pressure to these areas. WOUND #4: - Lower Leg Wound Laterality: Left, Medial Cleanser: Soap and Water 1 x Per Week/15 Days Discharge Instructions: May shower and wash wound with dial antibacterial soap and water prior to dressing change. Prim Dressing: Hydrofera Blue Ready Foam, 4x5 in 1 x Per Week/15 Days ary Discharge Instructions: Apply to wound bed as instructed Prim Dressing: Santyl Ointment 1 x Per Week/15 Days ary Discharge Instructions: Apply nickel thick amount  to wound bed as instructed Secondary Dressing: Woven Gauze Sponge, Non-Sterile 4x4 in 1 x Per Week/15 Days Discharge Instructions: Apply over primary dressing as directed. Secondary Dressing: ABD Pad, 8x10 1 x Per Week/15 Days Discharge Instructions: Apply over primary dressing as directed. Secondary Dressing: CarboFLEX Odor Control Dressing, 4x4 in 1 x Per Week/15 Days Discharge Instructions: Apply over primary dressing as directed. Com pression Wrap: Unnaboot w/Calamine, 4x10 (in/yd) 1 x Per Week/15 Days Discharge Instructions: unna at top of wrap to help secure Com pression Wrap: CoFlex TLC XL 2-layer Compression System 4x7 (in/yd) 1 x Per Week/15 Days Discharge Instructions: Apply CoFlex 2-layer compression as directed. (alt for 4 layer) 1. Continue zinc oxide for 1 week 2. Continue Hydrofera Blue and Santyl under compression wrap 3. Follow-up next week for nurse visit and in 2 weeks with me Electronic Signature(s) Signed: 12/15/2020 9:06:35 AM By: [name] DO Entered By: [name] on 12/14/2020  17:10:25 -------------------------------------------------------------------------------- HxROS Details Patient Name: Date of Service: [name], [name]. 12/14/2020 3:30 PM Medical Record Number: [id number] Patient Account Number: [id number] Date of Birth/Sex: Treating RN: 1970/08/01 (50 y.o. [name], [name] Primary Care Provider: [name] Other Clinician: Referring Provider: Treating Provider/Extender: Sammuel Bailiff in Treatment: 7 Information Obtained From Patient Eyes Medical History: Negative for: Cataracts; Glaucoma; Optic Neuritis Ear/Nose/Mouth/Throat Medical History: Negative for: Chronic sinus problems/congestion; Middle ear problems Hematologic/Lymphatic Medical History: Negative for: Anemia; Hemophilia; Human Immunodeficiency Virus; Lymphedema; Sickle Cell Disease Respiratory Medical History: Positive for: Sleep Apnea Negative for: Aspiration; Asthma; Chronic Obstructive Pulmonary Disease (COPD); Pneumothorax; Tuberculosis Cardiovascular Medical History: Positive for: Congestive Heart Failure; Coronary Artery Disease; Hypertension Negative for: Angina; Arrhythmia; Deep Vein Thrombosis; Hypotension; Myocardial Infarction; Peripheral Arterial Disease; Peripheral Venous Disease; Phlebitis; Vasculitis Gastrointestinal Medical History: Negative for: Cirrhosis ; Colitis; Crohns; Hepatitis A; Hepatitis B; Hepatitis C Endocrine Medical History: Negative for: Type I Diabetes; Type II Diabetes Genitourinary Medical History: Negative for: End Stage Renal Disease Immunological Medical History: Negative for: Lupus Erythematosus; Raynauds; Scleroderma Integumentary (Skin) Medical History: Negative for: History of Burn Musculoskeletal Medical History: Negative for: Gout; Rheumatoid Arthritis; Osteoarthritis; Osteomyelitis Neurologic Medical History: Negative for: Dementia; Neuropathy; Quadriplegia; Paraplegia; Seizure  Disorder Oncologic Medical History: Negative for: Received Chemotherapy; Received Radiation Psychiatric Medical History: Negative for: Anorexia/bulimia; Confinement Anxiety Immunizations Pneumococcal Vaccine: Received Pneumococcal Vaccination: No Implantable Devices None Family and Social History Cancer: No; Diabetes: Yes - Mother,Maternal Grandparents; Heart Disease: Yes - Paternal Grandparents; Hereditary Spherocytosis: No; Hypertension: Yes - Mother,Maternal Grandparents; Kidney Disease: No; Lung Disease: Yes - Father,Paternal Grandparents; Seizures: No; Stroke: No; Thyroid Problems: No; Tuberculosis: No; Former smoker; Marital Status - Single; Alcohol Use: Never; Drug Use: No History; Caffeine Use: Daily; Financial Concerns: No; Food, Clothing or Shelter Needs: No; Support System Lacking: No; Transportation Concerns: No Electronic Signature(s) Signed: 12/14/2020 5:10:54 PM By: Rhae Hammock RN Signed: 12/15/2020 9:06:35 AM By: [name] DO Entered By: [name] on 12/14/2020 17:07:20 -------------------------------------------------------------------------------- SuperBill Details Patient Name: Date of Service: [name], [name]. 12/14/2020 Medical Record Number: [id number] Patient Account Number: [id number] Date of Birth/Sex: Treating RN: 1971/01/08 (51 y.o. [name], [name] Primary Care Provider: [name] Other Clinician: Referring Provider: Treating Provider/Extender: Sammuel Bailiff in Treatment: 7 Diagnosis Coding ICD-10 Codes Code Description [phone] Non-pressure chronic ulcer of other part of left lower leg with unspecified severity I87.2 Venous insufficiency (chronic) (peripheral) I89.0 Lymphedema, not elsewhere classified Z86.718 Personal history of other venous thrombosis and embolism Facility Procedures CPT4 Code: [id number]  Description: Acupuncturist Use Only) [phone] - [place] [facility] LWR LT  LEG Modifier: Quantity: 1 Physician Procedures : CPT4 Code Description Modifier [id number] - WC PHYS LEVEL 3 - EST PT ICD-10 Diagnosis Description L97.829 Non-pressure chronic ulcer of other part of left lower leg with unspecified severity I87.2 Venous insufficiency (chronic) (peripheral) I89.0  Lymphedema, not elsewhere classified Quantity: 1 Electronic Signature(s) Signed: 12/15/2020 9:06:35 AM By: [name] DO Previous Signature: 12/14/2020 5:10:54 PM Version By: Rhae Hammock RN Entered By: [name] on 12/14/2020 17:10:41

## 2020-12-18 NOTE — Telephone Encounter (Signed)
Pt was upset due to the fact she still can not pick up her medication, pt stated she has been going back and forth with the pharmacy and the PA. Pt wants a call back for an update and to know what is going on.

## 2020-12-19 ENCOUNTER — Ambulatory Visit (INDEPENDENT_AMBULATORY_CARE_PROVIDER_SITE_OTHER): Payer: Medicaid Other | Admitting: Vascular Surgery

## 2020-12-19 ENCOUNTER — Encounter (INDEPENDENT_AMBULATORY_CARE_PROVIDER_SITE_OTHER): Payer: Self-pay | Admitting: Vascular Surgery

## 2020-12-21 ENCOUNTER — Encounter (HOSPITAL_BASED_OUTPATIENT_CLINIC_OR_DEPARTMENT_OTHER): Payer: Medicaid Other | Admitting: Internal Medicine

## 2020-12-21 ENCOUNTER — Other Ambulatory Visit: Payer: Self-pay

## 2020-12-21 DIAGNOSIS — L97829 Non-pressure chronic ulcer of other part of left lower leg with unspecified severity: Secondary | ICD-10-CM | POA: Diagnosis not present

## 2020-12-21 NOTE — Progress Notes (Signed)
ARILENE, METTERS ([id number]) Visit Report for 12/21/2020 Arrival Information Details Patient Name: Date of Service: [name], [name] 12/21/2020 3:15 PM Medical Record Number: [id number] Patient Account Number: [id number] Date of Birth/Sex: Treating RN: August 02, 1970 (50 y.o. [name], [name] Primary Care Rykin Route: [name] Other Clinician: Donavan Burnet Referring Staisha Winiarski: Treating Demarea Lorey/Extender: Sammuel Bailiff in Treatment: 8 Visit Information History Since Last Visit All ordered tests and consults were completed: Yes Patient Arrived: Ambulatory Added or deleted any medications: No Arrival Time: 15:15 Any new allergies or adverse reactions: No Accompanied By: self Had a fall or experienced change in No Transfer Assistance: None activities of daily living that may affect Patient Identification Verified: Yes risk of falls: Secondary Verification Process Completed: Yes Signs or symptoms of abuse/neglect since last visito No Patient Requires Transmission-Based Precautions: No Hospitalized since last visit: No Patient Has Alerts: Yes Implantable device outside of the clinic excluding No Patient Alerts: ABI's: 11/21 L:1.17 cellular tissue based products placed in the center since last visit: Pain Present Now: No Electronic Signature(s) Signed: 12/21/2020 5:02:32 PM By: Donavan Burnet EMT Entered By: Donavan Burnet on 12/21/2020 15:32:54 -------------------------------------------------------------------------------- Compression Therapy Details Patient Name: Date of Service: [name], [name] 12/21/2020 3:15 PM Medical Record Number: [id number] Patient Account Number: [id number] Date of Birth/Sex: Treating RN: 1971/01/07 (50 y.o. [name] Primary Care [name] [name]: [name] Other Clinician: Donavan Burnet Referring Nemesis Rainwater: Treating Alwilda Gilland/Extender: Sammuel Bailiff in Treatment:  8 Compression Therapy Performed for Wound Assessment: Wound #4 Left,Medial Lower Leg Performed By: Clinician Levan Hurst, RN Compression Type: Double Layer Electronic Signature(s) Signed: 12/21/2020 4:57:56 PM By: Levan Hurst RN, BSN Entered By: Levan Hurst on 12/21/2020 16:37:49 -------------------------------------------------------------------------------- Encounter Discharge Information Details Patient Name: Date of Service: Megan Session D. 12/21/2020 3:15 PM Medical Record Number: [id number] Patient Account Number: [id number] Date of Birth/Sex: Treating RN: [birth year] (50 y.o. [name] Primary Care Mansur Patti: Other Clinician: Leandro Reasoner Referring Deazia Lampi: Treating Georgeanna Radziewicz/Extender: Sammuel Bailiff in Treatment: 8 Encounter Discharge Information Items Discharge Condition: Stable Ambulatory Status: Ambulatory Discharge Destination: Home Transportation: Private Auto Accompanied By: alone Schedule Follow-up Appointment: Yes Clinical Summary of Care: Patient Declined Electronic Signature(s) Signed: 12/21/2020 4:57:56 PM By: Levan Hurst RN, BSN Entered By: Levan Hurst on 12/21/2020 16:38:23 -------------------------------------------------------------------------------- Wound Assessment Details Patient Name: Date of Service: Megan Session D. 12/21/2020 3:15 PM Medical Record Number: [id number] Patient Account Number: [id number] Date of Birth/Sex: Treating RN: 07/19/1970 (50 y.o. [name] Primary Care Corde Antonini: [name] Other Clinician: Donavan Burnet Referring Carlos Heber: Treating Mohsen Odenthal/Extender: Sammuel Bailiff in Treatment: 8 Wound Status Wound Number: 4 Primary Venous Leg Ulcer Etiology: Wound Location: Left, Medial Lower Leg Wound Open Wounding Event: Gradually Appeared Status: Date Acquired: 09/26/2020 Comorbid Sleep Apnea, Congestive Heart  Failure, Coronary Artery Weeks Of Treatment: 8 History: Disease, Hypertension Clustered Wound: No Wound Measurements Length: (cm) 2.3 Width: (cm) 2.5 Depth: (cm) 0.1 Area: (cm) 4.516 Volume: (cm) 0.452 % Reduction in Area: 72.2% % Reduction in Volume: 72.2% Epithelialization: Medium (34-66%) Tunneling: No Undermining: No Wound Description Classification: Full Thickness Without Exposed Support Structures Wound Margin: Distinct, outline attached Exudate Amount: Medium Exudate Type: Serosanguineous Exudate Color: red, brown Foul Odor After Cleansing: No Slough/Fibrino No Wound Bed Granulation Amount: Large (67-100%) Exposed Structure Granulation Quality: Red Fascia Exposed: No Necrotic Amount: None Present (0%) Fat Layer (Subcutaneous Tissue) Exposed: Yes Tendon Exposed: No Muscle Exposed: No Joint Exposed:  No Bone Exposed: No Treatment Notes Wound #4 (Lower Leg) Wound Laterality: Left, Medial Cleanser Soap and Water Discharge Instruction: May shower and wash wound with dial antibacterial soap and water prior to dressing change. Peri-Wound Care Topical Primary Dressing Hydrofera Blue Ready Foam, 4x5 in Discharge Instruction: Apply to wound bed as instructed Santyl Ointment Discharge Instruction: Apply nickel thick amount to wound bed as instructed Secondary Dressing Woven Gauze Sponge, Non-Sterile 4x4 in Discharge Instruction: Apply over primary dressing as directed. ABD Pad, 8x10 Discharge Instruction: Apply over primary dressing as directed. CarboFLEX Odor Control Dressing, 4x4 in Discharge Instruction: Apply over primary dressing as directed. Secured With Compression Wrap Unnaboot w/Calamine, 4x10 (in/yd) Discharge Instruction: unna at top of wrap to help secure CoFlex TLC XL 2-layer Compression System 4x7 (in/yd) Discharge Instruction: Apply CoFlex 2-layer compression as directed. (alt for 4 layer) Compression Stockings Add-Ons Electronic  Signature(s) Signed: 12/21/2020 4:57:56 PM By: Levan Hurst RN, BSN Entered By: Levan Hurst on 12/21/2020 16:37:38 -------------------------------------------------------------------------------- [place] Details Patient Name: Date of Service: [name], [name] 12/21/2020 3:15 PM Medical Record Number: [id number] Patient Account Number: [id number] Date of Birth/Sex: Treating RN: 02-10-1971 (50 y.o. [name], [name] Primary Care Johnny Latu: [name] Other Clinician: Donavan Burnet Referring Quatavious Rossa: Treating Shaquoya Cosper/Extender: Sammuel Bailiff in Treatment: 8 Vital Signs Time Taken: 15:36 Temperature (F): 98.0 Height (in): 67 Pulse (bpm): 80 Weight (lbs): 346 Respiratory Rate (breaths/min): 18 Body Mass Index (BMI): 54.2 Blood Pressure (mmHg): 101/60 Reference Range: 80 - 120 mg / dl Electronic Signature(s) Signed: 12/21/2020 5:02:32 PM By: Donavan Burnet EMT Entered By: Donavan Burnet on 12/21/2020 15:40:00

## 2020-12-22 ENCOUNTER — Ambulatory Visit (HOSPITAL_COMMUNITY)
Admission: RE | Admit: 2020-12-22 | Discharge: 2020-12-22 | Disposition: A | Discharge status: Home / Self Care | Payer: Medicaid Other | Source: Ambulatory Visit | Attending: Student | Admitting: Student

## 2020-12-22 DIAGNOSIS — G4733 Obstructive sleep apnea (adult) (pediatric): Secondary | ICD-10-CM

## 2020-12-22 DIAGNOSIS — J45998 Other asthma: Secondary | ICD-10-CM

## 2020-12-22 LAB — BLOOD GAS, ARTERIAL
Acid-Base Excess: 0.5 mmol/L (ref 0.0–2.0)
Bicarbonate: 24.9 mmol/L (ref 20.0–28.0)
Drawn by: 21179
FIO2: 21
O2 Saturation: 95.5 %
Patient temperature: 37
pCO2 arterial: 42.3 mmHg (ref 32.0–48.0)
pH, Arterial: 7.388 (ref 7.350–7.450)
pO2, Arterial: 80.1 mmHg — ABNORMAL LOW (ref 83.0–108.0)

## 2020-12-22 NOTE — Progress Notes (Signed)
[name], [name] ([id number]) Visit Report for 12/21/2020 SuperBill Details Patient Name: Date of Service: [name], [name] 12/21/2020 Medical Record Number: [id number] Patient Account Number: [id number] Date of Birth/Sex: Treating RN: 1971-01-05 (50 y.o. [name] Primary Care Provider: [name] Other Clinician: Donavan Burnet Referring Provider: Treating Provider/Extender: Sammuel Bailiff in Treatment: 8 Diagnosis Coding ICD-10 Codes Code Description [id number] Non-pressure chronic ulcer of other part of left lower leg with unspecified severity I87.2 Venous insufficiency (chronic) (peripheral) I89.0 Lymphedema, not elsewhere classified Z86.718 Personal history of other venous thrombosis and embolism Facility Procedures CPT4 Code Description Modifier Quantity [id number] (Facility Use Only) [phone] - [facility] 1 Electronic Signature(s) Signed: 12/21/2020 4:57:56 PM By: Levan Hurst RN, BSN Signed: 12/22/2020 8:47:49 AM By: [name] DO Entered By: Levan Hurst on 12/21/2020 16:38:33

## 2020-12-22 NOTE — Progress Notes (Signed)
Patient into for ABG on RA.   Procedure done and results in computer.   Oxygen saturation on pulse ox today is 95%.

## 2020-12-25 ENCOUNTER — Telehealth: Payer: Self-pay | Admitting: Student

## 2020-12-25 DIAGNOSIS — G4733 Obstructive sleep apnea (adult) (pediatric): Secondary | ICD-10-CM

## 2020-12-25 NOTE — Telephone Encounter (Signed)
Ordered home sleep apnea test

## 2020-12-28 ENCOUNTER — Encounter (HOSPITAL_BASED_OUTPATIENT_CLINIC_OR_DEPARTMENT_OTHER): Payer: Medicaid Other | Attending: Internal Medicine | Admitting: Internal Medicine

## 2020-12-28 ENCOUNTER — Other Ambulatory Visit: Payer: Self-pay

## 2020-12-28 DIAGNOSIS — L97829 Non-pressure chronic ulcer of other part of left lower leg with unspecified severity: Secondary | ICD-10-CM | POA: Insufficient documentation

## 2020-12-28 DIAGNOSIS — I872 Venous insufficiency (chronic) (peripheral): Secondary | ICD-10-CM | POA: Diagnosis not present

## 2020-12-28 DIAGNOSIS — Z8249 Family history of ischemic heart disease and other diseases of the circulatory system: Secondary | ICD-10-CM | POA: Diagnosis not present

## 2020-12-28 DIAGNOSIS — Z87891 Personal history of nicotine dependence: Secondary | ICD-10-CM | POA: Diagnosis not present

## 2020-12-28 DIAGNOSIS — I5032 Chronic diastolic (congestive) heart failure: Secondary | ICD-10-CM | POA: Insufficient documentation

## 2020-12-28 DIAGNOSIS — I110 Hypertensive heart disease with heart failure: Secondary | ICD-10-CM | POA: Diagnosis not present

## 2020-12-28 DIAGNOSIS — Z86718 Personal history of other venous thrombosis and embolism: Secondary | ICD-10-CM | POA: Insufficient documentation

## 2020-12-28 DIAGNOSIS — I890 Lymphedema, not elsewhere classified: Secondary | ICD-10-CM | POA: Diagnosis not present

## 2020-12-28 NOTE — Progress Notes (Signed)
Megan, [name] ([id number]) Visit Report for 12/28/2020 Chief Complaint Document Details Patient Name: Date of Service: [name], [name] 12/28/2020 2:30 PM Medical Record Number: [id number] Patient Account Number: [id number] Date of Birth/Sex: Treating RN: Oct 08, 1970 (50 y.o. [name] Primary Care Provider: [name] Other Clinician: Referring Provider: Treating Provider/Extender: Sammuel Bailiff in Treatment: 9 Information Obtained from: Patient Chief Complaint Left lower extremity wound Electronic Signature(s) Signed: 12/28/2020 3:25:45 PM By: [name] DO Entered By: [name] on 12/28/2020 15:22:51 -------------------------------------------------------------------------------- HPI Details Patient Name: Date of Service: [name], [name] 12/28/2020 2:30 PM Medical Record Number: [id number] Patient Account Number: [id number] Date of Birth/Sex: Treating RN: 30-Sep-1970 (50 y.o. [name] Primary Care Provider: [name] Other Clinician: Referring Provider: Treating Provider/Extender: Sammuel Bailiff in Treatment: 9 History of Present Illness HPI Description: Admission 6/24 [name] with a past medical history of chronic venous insufficiency, lymphedema, DVT on anticoagulation and diastolic heart failure that presents to the clinic for left lower extremity wound. She was last seen 4 months ago in our clinic for the same issue. The reoccurring wound started at the end of May and has been going on for 1 month. She has been using an ointment and I am unclear what this is. She tries to keep her leg elevated with her compression stocking. She also reports she has lymphedema pumps and has been using them as well. She reports mild pain to the area. She denies signs of infection including increased warmth, erythema or purulent drainage. 7/1; patient  presents for 1 week follow-up. She has tolerated the wrap well. Unfortunately she did have trouble with the wrap Sliding down her leg 2 days ago. She has no issues or complaints today. She denies signs of infection. 7/15; patient presents for follow-up. She has tolerated the wrap well. She has no issues or complaints today. She denies signs of infection. 7/25; patient presents for 1 week follow-up. She has no issues or complaints today. She denies signs of infection. 8/4; patient presents for follow-up. She has tolerated the compression wrap well to her left lower extremity. She states she was in the hospital last week due to fluid overload. She subsequently developed blisters to her bottom from being in the hospital bed for prolonged periods of time. These have since ruptured. She now has 2 areas of skin breakdown. She has been keeping gauze on them. She denies signs of infection. 8/18; patient presents for 2-week follow-up. She reports improvement to her buttocks wounds. She is able to tolerate the wrap well to her left lower extremity with no issues. She denies signs of infection. 9/1; patient presents for 2-week follow-up. She has no issues or complaints today. she denies signs of infection. Electronic Signature(s) Signed: 12/28/2020 3:25:45 PM By: [name] DO Entered By: [name] on 12/28/2020 15:23:12 -------------------------------------------------------------------------------- Physical Exam Details Patient Name: Date of Service: Megan Session D. 12/28/2020 2:30 PM Medical Record Number: [id number] Patient Account Number: [id number] Date of Birth/Sex: Treating RN: April 22, 1971 (50 y.o. [name] Primary Care Provider: [name] Other Clinician: Referring Provider: Treating Provider/Extender: Sammuel Bailiff in Treatment: 9 Constitutional respirations regular, non-labored and within target range for patient.. Cardiovascular 2+  dorsalis pedis/posterior tibialis pulses. Psychiatric pleasant and cooperative. Notes Left lower extremity: On the medial aspect there are 2 small open wounds with granulation tissue present. No signs of infection. Good edema control. Electronic  Signature(s) Signed: 12/28/2020 3:25:45 PM By: [name] DO Entered By: [name] on 12/28/2020 15:23:56 -------------------------------------------------------------------------------- Physician Orders Details Patient Name: Date of Service: [name], [name] 12/28/2020 2:30 PM Medical Record Number: [id number] Patient Account Number: [id number] Date of Birth/Sex: Treating RN: 03-Nov-1970 (50 y.o. [name] Primary Care Provider: [name] Other Clinician: Referring Provider: Treating Provider/Extender: Sammuel Bailiff in Treatment: 9 Verbal / Phone Orders: No Diagnosis Coding ICD-10 Coding Code Description [phone] Non-pressure chronic ulcer of other part of left lower leg with unspecified severity I87.2 Venous insufficiency (chronic) (peripheral) I89.0 Lymphedema, not elsewhere classified Z86.718 Personal history of other venous thrombosis and embolism Follow-up Appointments ppointment in 2 weeks. - Dr. Heber [place] Return A Nurse Visit: [name] or Friday for re-wrap Bathing/ Shower/ Hygiene May shower with protection but do not get wound dressing(s) wet. - use cast protector to keep wrap dry in the shower Edema Control - Lymphedema / SCD / Other Bilateral Lower Extremities Lymphedema Pumps. Use Lymphedema pumps on leg(s) 2-3 times a day for 45-60 minutes. If wearing any wraps or hose, do not remove them. Continue exercising as instructed. Elevate legs to the level of the heart or above for 30 minutes daily and/or when sitting, a frequency of: Avoid standing for long periods of time. Patient to wear own compression stockings every day. - right leg daily Exercise regularly Off-Loading Turn  and reposition every 2 hours - Ensure not to apply pressure to these areas. Wound Treatment Wound #4 - Lower Leg Wound Laterality: Left, Medial Cleanser: Soap and Water 1 x Per Week/15 Days Discharge Instructions: May shower and wash wound with dial antibacterial soap and water prior to dressing change. Peri-Wound Care: Sween Lotion (Moisturizing lotion) 1 x Per Week/15 Days Discharge Instructions: Apply moisturizing lotion as directed Prim Dressing: Hydrofera Blue Ready Foam, 4x5 in 1 x Per Week/15 Days ary Discharge Instructions: Apply to wound bed as instructed Secondary Dressing: Woven Gauze Sponge, Non-Sterile 4x4 in 1 x Per Week/15 Days Discharge Instructions: Apply over primary dressing as directed. Secondary Dressing: ABD Pad, 8x10 1 x Per Week/15 Days Discharge Instructions: Apply over primary dressing as directed. Compression Wrap: Unnaboot w/Calamine, 4x10 (in/yd) 1 x Per Week/15 Days Discharge Instructions: unna at top of wrap to help secure Compression Wrap: CoFlex TLC XL 2-layer Compression System 4x7 (in/yd) 1 x Per Week/15 Days Discharge Instructions: Apply CoFlex 2-layer compression as directed. (alt for 4 layer) Electronic Signature(s) Signed: 12/28/2020 3:25:45 PM By: [name] DO Entered By: [name] on 12/28/2020 15:24:07 -------------------------------------------------------------------------------- Problem List Details Patient Name: Date of Service: [name], [name] 12/28/2020 2:30 PM Medical Record Number: [id number] Patient Account Number: [id number] Date of Birth/Sex: Treating RN: Mar 27, 1971 (50 y.o. [name] Primary Care Provider: [name] Other Clinician: Referring Provider: Treating Provider/Extender: Sammuel Bailiff in Treatment: 9 Active Problems ICD-10 Encounter Code Description Active Date MDM Diagnosis L97.829 Non-pressure chronic ulcer of other part of left lower leg with unspecified  10/20/2020 No Yes severity I87.2 Venous insufficiency (chronic) (peripheral) 10/20/2020 No Yes I89.0 Lymphedema, not elsewhere classified 10/20/2020 No Yes Z86.718 Personal history of other venous thrombosis and embolism 10/20/2020 No Yes Inactive Problems Resolved Problems Electronic Signature(s) Signed: 12/28/2020 3:25:45 PM By: [name] DO Entered By: [name] on 12/28/2020 15:22:41 -------------------------------------------------------------------------------- Progress Note Details Patient Name: Date of Service: [name], [name] 12/28/2020 2:30 PM Medical Record Number: [id number] Patient Account Number: [id number] Date of Birth/Sex: Treating RN: Jan 27, 1971 (  51 y.o. [name] Primary Care Provider: [name] Other Clinician: Referring Provider: Treating Provider/Extender: Sammuel Bailiff in Treatment: 9 Subjective Chief Complaint Information obtained from Patient Left lower extremity wound History of Present Illness (HPI) Admission 6/24 Ms. Elliannah Ekman is a 50 year old female with a past medical history of chronic venous insufficiency, lymphedema, DVT on anticoagulation and diastolic heart failure that presents to the clinic for left lower extremity wound. She was last seen 4 months ago in our clinic for the same issue. The reoccurring wound started at the end of May and has been going on for 1 month. She has been using an ointment and I am unclear what this is. She tries to keep her leg elevated with her compression stocking. She also reports she has lymphedema pumps and has been using them as well. She reports mild pain to the area. She denies signs of infection including increased warmth, erythema or purulent drainage. 7/1; patient presents for 1 week follow-up. She has tolerated the wrap well. Unfortunately she did have trouble with the wrap Sliding down her leg 2 days ago. She has no issues or complaints today. She  denies signs of infection. 7/15; patient presents for follow-up. She has tolerated the wrap well. She has no issues or complaints today. She denies signs of infection. 7/25; patient presents for 1 week follow-up. She has no issues or complaints today. She denies signs of infection. 8/4; patient presents for follow-up. She has tolerated the compression wrap well to her left lower extremity. She states she was in the hospital last week due to fluid overload. She subsequently developed blisters to her bottom from being in the hospital bed for prolonged periods of time. These have since ruptured. She now has 2 areas of skin breakdown. She has been keeping gauze on them. She denies signs of infection. 8/18; patient presents for 2-week follow-up. She reports improvement to her buttocks wounds. She is able to tolerate the wrap well to her left lower extremity with no issues. She denies signs of infection. 9/1; patient presents for 2-week follow-up. She has no issues or complaints today. she denies signs of infection. Patient History Information obtained from Patient. Family History Diabetes - Mother,Maternal Grandparents, Heart Disease - Paternal Grandparents, Hypertension - Mother,Maternal Grandparents, Lung Disease - Father,Paternal Grandparents, No family history of Cancer, Hereditary Spherocytosis, Kidney Disease, Seizures, Stroke, Thyroid Problems, Tuberculosis. Social History Former smoker, Marital Status - Single, Alcohol Use - Never, Drug Use - No History, Caffeine Use - Daily. Medical History Eyes Denies history of Cataracts, Glaucoma, Optic Neuritis Ear/Nose/Mouth/Throat Denies history of Chronic sinus problems/congestion, Middle ear problems Hematologic/Lymphatic Denies history of Anemia, Hemophilia, Human Immunodeficiency Virus, Lymphedema, Sickle Cell Disease Respiratory Patient has history of Sleep Apnea Denies history of Aspiration, Asthma, Chronic Obstructive Pulmonary Disease  (COPD), Pneumothorax, Tuberculosis Cardiovascular Patient has history of Congestive Heart Failure, Coronary Artery Disease, Hypertension Denies history of Angina, Arrhythmia, Deep Vein Thrombosis, Hypotension, Myocardial Infarction, Peripheral Arterial Disease, Peripheral Venous Disease, Phlebitis, Vasculitis Gastrointestinal Denies history of Cirrhosis , Colitis, Crohnoos, Hepatitis A, Hepatitis B, Hepatitis C Endocrine Denies history of Type I Diabetes, Type II Diabetes Genitourinary Denies history of End Stage Renal Disease Immunological Denies history of Lupus Erythematosus, Raynaudoos, Scleroderma Integumentary (Skin) Denies history of History of Burn Musculoskeletal Denies history of Gout, Rheumatoid Arthritis, Osteoarthritis, Osteomyelitis Neurologic Denies history of Dementia, Neuropathy, Quadriplegia, Paraplegia, Seizure Disorder Oncologic Denies history of Received Chemotherapy, Received Radiation Psychiatric Denies history of Anorexia/bulimia, Confinement Anxiety Objective Constitutional  respirations regular, non-labored and within target range for patient.. Vitals Time Taken: 2:27 PM, Height: 67 in, Weight: 346 lbs, BMI: 54.2, Temperature: 97.8 F, Pulse: 97 bpm, Respiratory Rate: 18 breaths/min, Blood Pressure: 130/82 mmHg. Cardiovascular 2+ dorsalis pedis/posterior tibialis pulses. Psychiatric pleasant and cooperative. General Notes: Left lower extremity: On the medial aspect there are 2 small open wounds with granulation tissue present. No signs of infection. Good edema control. Integumentary (Hair, Skin) Wound #4 status is Open. Original cause of wound was Gradually Appeared. The date acquired was: 09/26/2020. The wound has been in treatment 9 weeks. The wound is located on the Left,Medial Lower Leg. The wound measures 0.5cm length x 2.7cm width x 0.1cm depth; 1.06cm^2 area and 0.106cm^3 volume. There is Fat Layer (Subcutaneous Tissue) exposed. There is no  tunneling or undermining noted. There is a medium amount of serosanguineous drainage noted. The wound margin is distinct with the outline attached to the wound base. There is large (67-100%) red granulation within the wound bed. There is no necrotic tissue within the wound bed. Assessment Active Problems ICD-10 Non-pressure chronic ulcer of other part of left lower leg with unspecified severity Venous insufficiency (chronic) (peripheral) Lymphedema, not elsewhere classified Personal history of other venous thrombosis and embolism Patient's wound continues to show improvement in size and appearance. There are now 2 small open wounds present with granulation tissue. No signs of infection. I recommended just Hydrofera Blue under compression. She has compression stockings and I asked her to bring them to next clinic visit. I am hopeful she will be closed in the next 2 weeks. Procedures Wound #4 Pre-procedure diagnosis of Wound #4 is a Venous Leg Ulcer located on the Left,Medial Lower Leg . There was a Double Layer Compression Therapy Procedure by [name], RN. Post procedure Diagnosis Wound #4: Same as Pre-Procedure Plan Follow-up Appointments: Return Appointment in 2 weeks. - Dr. Heber [place] Nurse Visit: [name] or Friday for re-wrap Bathing/ Shower/ Hygiene: May shower with protection but do not get wound dressing(s) wet. - use cast protector to keep wrap dry in the shower Edema Control - Lymphedema / SCD / Other: Lymphedema Pumps. Use Lymphedema pumps on leg(s) 2-3 times a day for 45-60 minutes. If wearing any wraps or hose, do not remove them. Continue exercising as instructed. Elevate legs to the level of the heart or above for 30 minutes daily and/or when sitting, a frequency of: Avoid standing for long periods of time. Patient to wear own compression stockings every day. - right leg daily Exercise regularly Off-Loading: Turn and reposition every 2 hours - Ensure not to apply  pressure to these areas. WOUND #4: - Lower Leg Wound Laterality: Left, Medial Cleanser: Soap and Water 1 x Per Week/15 Days Discharge Instructions: May shower and wash wound with dial antibacterial soap and water prior to dressing change. Peri-Wound Care: Sween Lotion (Moisturizing lotion) 1 x Per Week/15 Days Discharge Instructions: Apply moisturizing lotion as directed Prim Dressing: Hydrofera Blue Ready Foam, 4x5 in 1 x Per Week/15 Days ary Discharge Instructions: Apply to wound bed as instructed Secondary Dressing: Woven Gauze Sponge, Non-Sterile 4x4 in 1 x Per Week/15 Days Discharge Instructions: Apply over primary dressing as directed. Secondary Dressing: ABD Pad, 8x10 1 x Per Week/15 Days Discharge Instructions: Apply over primary dressing as directed. Com pression Wrap: Unnaboot w/Calamine, 4x10 (in/yd) 1 x Per Week/15 Days Discharge Instructions: unna at top of wrap to help secure Com pression Wrap: CoFlex TLC XL 2-layer Compression System 4x7 (in/yd) 1 x  Per Week/15 Days Discharge Instructions: Apply CoFlex 2-layer compression as directed. (alt for 4 layer) 1. Hydrofera Blue under compression wrap 2. Follow-up next week for nurse visit and following week with me Electronic Signature(s) Signed: 12/28/2020 3:25:45 PM By: [name] DO Entered By: [name] on 12/28/2020 15:25:16 -------------------------------------------------------------------------------- HxROS Details Patient Name: Date of Service: [name], [name] 12/28/2020 2:30 PM Medical Record Number: [id number] Patient Account Number: [id number] Date of Birth/Sex: Treating RN: 04-21-1971 (50 y.o. [name] Primary Care Provider: [name] Other Clinician: Referring Provider: Treating Provider/Extender: Sammuel Bailiff in Treatment: 9 Information Obtained From Patient Eyes Medical History: Negative for: Cataracts; Glaucoma; Optic  Neuritis Ear/Nose/Mouth/Throat Medical History: Negative for: Chronic sinus problems/congestion; Middle ear problems Hematologic/Lymphatic Medical History: Negative for: Anemia; Hemophilia; Human Immunodeficiency Virus; Lymphedema; Sickle Cell Disease Respiratory Medical History: Positive for: Sleep Apnea Negative for: Aspiration; Asthma; Chronic Obstructive Pulmonary Disease (COPD); Pneumothorax; Tuberculosis Cardiovascular Medical History: Positive for: Congestive Heart Failure; Coronary Artery Disease; Hypertension Negative for: Angina; Arrhythmia; Deep Vein Thrombosis; Hypotension; Myocardial Infarction; Peripheral Arterial Disease; Peripheral Venous Disease; Phlebitis; Vasculitis Gastrointestinal Medical History: Negative for: Cirrhosis ; Colitis; Crohns; Hepatitis A; Hepatitis B; Hepatitis C Endocrine Medical History: Negative for: Type I Diabetes; Type II Diabetes Genitourinary Medical History: Negative for: End Stage Renal Disease Immunological Medical History: Negative for: Lupus Erythematosus; Raynauds; Scleroderma Integumentary (Skin) Medical History: Negative for: History of Burn Musculoskeletal Medical History: Negative for: Gout; Rheumatoid Arthritis; Osteoarthritis; Osteomyelitis Neurologic Medical History: Negative for: Dementia; Neuropathy; Quadriplegia; Paraplegia; Seizure Disorder Oncologic Medical History: Negative for: Received Chemotherapy; Received Radiation Psychiatric Medical History: Negative for: Anorexia/bulimia; Confinement Anxiety Immunizations Pneumococcal Vaccine: Received Pneumococcal Vaccination: No Implantable Devices None Family and Social History Cancer: No; Diabetes: Yes - Mother,Maternal Grandparents; Heart Disease: Yes - Paternal Grandparents; Hereditary Spherocytosis: No; Hypertension: Yes - Mother,Maternal Grandparents; Kidney Disease: No; Lung Disease: Yes - Father,Paternal Grandparents; Seizures: No; Stroke: No; Thyroid  Problems: No; Tuberculosis: No; Former smoker; Marital Status - Single; Alcohol Use: Never; Drug Use: No History; Caffeine Use: Daily; Financial Concerns: No; Food, Clothing or Shelter Needs: No; Support System Lacking: No; Transportation Concerns: No Electronic Signature(s) Signed: 12/28/2020 3:25:45 PM By: [name] DO Signed: 12/28/2020 4:23:50 PM By: Rhae Hammock RN Entered By: [name] on 12/28/2020 15:23:18 -------------------------------------------------------------------------------- SuperBill Details Patient Name: Date of Service: [name], Blakely D. 12/28/2020 Medical Record Number: [id number] Patient Account Number: [id number] Date of Birth/Sex: Treating RN: [birth year] (50 y.o. [name] Primary Care Provider: [name] Other Clinician: Referring Provider: Treating Provider/Extender: Sammuel Bailiff in Treatment: 9 Diagnosis Coding ICD-10 Codes Code Description [phone] Non-pressure chronic ulcer of other part of left lower leg with unspecified severity I87.2 Venous insufficiency (chronic) (peripheral) I89.0 Lymphedema, not elsewhere classified Z86.718 Personal history of other venous thrombosis and embolism Facility Procedures CPT4 Code: [id number] Description: (Facility Use Only) [phone] - [facility] [id number] LWR LT LEG ICD-10 Diagnosis Description L97.829 Non-pressure chronic ulcer of other part of left lower leg with unspecified severity Modifier: Quantity: 1 Physician Procedures : CPT4 Code Description Modifier [id number] - WC PHYS LEVEL 3 - EST PT ICD-10 Diagnosis Description L97.829 Non-pressure chronic ulcer of other part of left lower leg with unspecified severity I87.2 Venous insufficiency (chronic) (peripheral) I89.0  Lymphedema, not elsewhere classified Z86.718 Personal history of other venous thrombosis and embolism Quantity: 1 Electronic Signature(s) Signed: 12/28/2020 3:25:45 PM By: [name]  DO Entered By: [name] on 12/28/2020 15:25:30

## 2020-12-28 NOTE — Progress Notes (Signed)
Megan, [name] ([id number]) Visit Report for 12/28/2020 Arrival Information Details Patient Name: Date of Service: [name], [name] 12/28/2020 2:30 PM Medical Record Number: [id number] Patient Account Number: [id number] Date of Birth/Sex: Treating RN: 1970-05-21 (50 y.o. [name] Primary Care Audre Cenci: [name] Other Clinician: Referring Maiko Salais: Treating Shailene Demonbreun/Extender: Sammuel Bailiff in Treatment: 9 Visit Information History Since Last Visit Added or deleted any medications: No Patient Arrived: Ambulatory Any new allergies or adverse reactions: No Arrival Time: 14:21 Had a fall or experienced change in No Accompanied By: self activities of daily living that may affect Transfer Assistance: None risk of falls: Patient Identification Verified: Yes Signs or symptoms of abuse/neglect since last visito No Secondary Verification Process Completed: Yes Hospitalized since last visit: No Patient Requires Transmission-Based Precautions: No Implantable device outside of the clinic excluding No Patient Has Alerts: Yes cellular tissue based products placed in the center Patient Alerts: ABI's: 11/21 L:1.17 since last visit: Has Dressing in Place as Prescribed: Yes Pain Present Now: No Electronic Signature(s) Signed: 12/28/2020 4:37:51 PM By: [name] Entered By: [name] on 12/28/2020 14:25:23 -------------------------------------------------------------------------------- Compression Therapy Details Patient Name: Date of Service: [name], [name] 12/28/2020 2:30 PM Medical Record Number: [id number] Patient Account Number: [id number] Date of Birth/Sex: Treating RN: 1970-09-26 (50 y.o. [name] Primary Care [name]: [name] Other Clinician: Referring [name]: Treating [name]/Extender: Sammuel Bailiff in Treatment: 9 Compression Therapy Performed for Wound Assessment: Wound #4  Left,Medial Lower Leg Performed By: Clinician [name], RN Compression Type: Double Layer Post Procedure Diagnosis Same as Pre-procedure Electronic Signature(s) Signed: 12/28/2020 4:37:51 PM By: [name] Entered By: [name] on 12/28/2020 15:10:14 -------------------------------------------------------------------------------- Encounter Discharge Information Details Patient Name: Date of Service: Megan Session D. 12/28/2020 2:30 PM Medical Record Number: [id number] Patient Account Number: [id number] Date of Birth/Sex: Treating RN: 1970/05/10 (50 y.o. [name] Primary Care [name]: [name] Other Clinician: Referring [name]: Treating [name]/Extender: Sammuel Bailiff in Treatment: 9 Encounter Discharge Information Items Discharge Condition: Stable Ambulatory Status: Ambulatory Discharge Destination: Home Transportation: Private Auto Schedule Follow-up Appointment: Yes Clinical Summary of Care: Provided on 12/28/2020 Form Type Recipient Paper Patient Patient Electronic Signature(s) Signed: 12/28/2020 4:37:51 PM By: [name] Entered By: [name] on 12/28/2020 15:24:28 -------------------------------------------------------------------------------- Lower Extremity Assessment Details Patient Name: Date of Service: Megan Session D. 12/28/2020 2:30 PM Medical Record Number: [id number] Patient Account Number: [id number] Date of Birth/Sex: Treating RN: [birth year] (50 y.o. [name] Primary Care [name]: [name] Other Clinician: Referring [name]: Treating [name]/Extender: Sammuel Bailiff in Treatment: 9 Edema Assessment Assessed: [name]: Yes] [name]: No] Edema: [Left: Ye] [Right: s] Calf Left: Right: Point of Measurement: 25 cm From Medial Instep 45 cm Ankle Left: Right: Point of Measurement: 9 cm From Medial Instep 25 cm Vascular  Assessment Pulses: Dorsalis Pedis Palpable: [Left:Yes] Electronic Signature(s) Signed: 12/28/2020 4:37:51 PM By: [name] Entered By: [name] on 12/28/2020 14:51:28 -------------------------------------------------------------------------------- Multi Wound Chart Details Patient Name: Date of Service: [name], [name] 12/28/2020 2:30 PM Medical Record Number: [id number] Patient Account Number: [id number] Date of Birth/Sex: Treating RN: Jun 18, 1970 (50 y.o. [name], Megan Primary Care Heyward [name]: [name] Other Clinician: Referring Carthel Castille: Treating [name]/Extender: Sammuel Bailiff in Treatment: 9 Vital Signs Height(in): 67 Pulse(bpm): 97 Weight(lbs): 346 Blood Pressure(mmHg): 130/82 Body Mass Index(BMI): 54 Temperature(F): 97.8 Respiratory Rate(breaths/min): 18 Photos: [N/A:N/A] Left, Medial Lower Leg N/A N/A  Wound Location: Gradually Appeared N/A N/A Wounding Event: Venous Leg Ulcer N/A N/A Primary Etiology: Sleep Apnea, Congestive Heart N/A N/A Comorbid History: Failure, Coronary Artery Disease, Hypertension 09/26/2020 N/A N/A Date Acquired: 9 N/A N/A Weeks of Treatment: Open N/A N/A Wound Status: 0.5x2.7x0.1 N/A N/A Measurements L x W x D (cm) 1.06 N/A N/A A (cm) : rea 0.106 N/A N/A Volume (cm) : 93.50% N/A N/A % Reduction in Area: 93.50% N/A N/A % Reduction in Volume: Full Thickness Without Exposed N/A N/A Classification: Support Structures Medium N/A N/A Exudate Amount: Serosanguineous N/A N/A Exudate Type: red, brown N/A N/A Exudate Color: Distinct, outline attached N/A N/A Wound Margin: Large (67-100%) N/A N/A Granulation Amount: Red N/A N/A Granulation Quality: None Present (0%) N/A N/A Necrotic Amount: Fat Layer (Subcutaneous Tissue): Yes N/A N/A Exposed Structures: Fascia: No Tendon: No Muscle: No Joint: No Bone: No Medium (34-66%) N/A N/A Epithelialization: Compression  Therapy N/A N/A Procedures Performed: Treatment Notes Electronic Signature(s) Signed: 12/28/2020 3:25:45 PM By: [name] DO Signed: 12/28/2020 4:23:50 PM By: Rhae Hammock RN Entered By: [name] on 12/28/2020 15:22:45 -------------------------------------------------------------------------------- Multi-Disciplinary Care Plan Details Patient Name: Date of Service: [name], Anais D. 12/28/2020 2:30 PM Medical Record Number: [id number] Patient Account Number: [id number] Date of Birth/Sex: Treating RN: 17-Sep-1970 (50 y.o. [name] Primary Care Fernandez Kenley: [name] Other Clinician: Referring Carolan Avedisian: Treating Naseer Hearn/Extender: Sammuel Bailiff in Treatment: 9 Multidisciplinary Care Plan reviewed with physician Active Inactive Venous Leg Ulcer Nursing Diagnoses: Actual venous Insuffiency (use after diagnosis is confirmed) Knowledge deficit related to disease process and management Potential for venous Insuffiency (use before diagnosis confirmed) Goals: Patient will maintain optimal edema control Date Initiated: 10/20/2020 Target Resolution Date: 01/25/2021 Goal Status: Active Interventions: Assess peripheral edema status every visit. Compression as ordered Treatment Activities: Therapeutic compression applied : 10/20/2020 Notes: 12/28/20: Edema control ongoing. Wound/Skin Impairment Nursing Diagnoses: Impaired tissue integrity Knowledge deficit related to ulceration/compromised skin integrity Goals: Patient/caregiver will verbalize understanding of skin care regimen Date Initiated: 10/20/2020 Date Inactivated: 12/28/2020 Target Resolution Date: 12/29/2020 Goal Status: Met Ulcer/skin breakdown will have a volume reduction of 30% by week 4 Date Initiated: 10/20/2020 Date Inactivated: 12/14/2020 Target Resolution Date: 11/24/2020 Goal Status: Met Ulcer/skin breakdown will have a volume reduction of 80% by week 12 Date  Initiated: 12/14/2020 Target Resolution Date: 01/18/2021 Goal Status: Active Interventions: Assess patient/caregiver ability to obtain necessary supplies Assess patient/caregiver ability to perform ulcer/skin care regimen upon admission and as needed Assess ulceration(s) every visit Provide education on ulcer and skin care Treatment Activities: Skin care regimen initiated : 10/20/2020 Topical wound management initiated : 10/20/2020 Notes: Electronic Signature(s) Signed: 12/28/2020 4:37:51 PM By: [name] Entered By: [name] on 12/28/2020 14:52:12 -------------------------------------------------------------------------------- Pain Assessment Details Patient Name: Date of Service: [name], Katrice D. 12/28/2020 2:30 PM Medical Record Number: [id number] Patient Account Number: [id number] Date of Birth/Sex: Treating RN: 1971-01-09 (50 y.o. [name] Primary Care Shamus Desantis: [name] Other Clinician: Referring Barbarajean Kinzler: Treating Aleksi Brummet/Extender: Sammuel Bailiff in Treatment: 9 Active Problems Location of Pain Severity and Description of Pain Patient Has Paino No Site Locations Pain Management and Medication Current Pain Management: Electronic Signature(s) Signed: 12/28/2020 4:37:51 PM By: [name] Entered By: [name] on 12/28/2020 14:27:36 -------------------------------------------------------------------------------- Patient/Caregiver Education Details Patient Name: Date of Service: Megan Session D. 9/1/2022andnbsp2:30 PM Medical Record Number: [id number] Patient Account Number: [id number] Date of Birth/Gender: Treating RN: 18-Dec-1970 (50 y.o. [name] Primary Care Physician:  [name] Other Clinician: Referring Physician: Treating Physician/Extender: Sammuel Bailiff in Treatment: 9 Education Assessment Education Provided To: Patient Education Topics  Provided Venous: Methods: Explain/Verbal, Printed Responses: State content correctly Wound/Skin Impairment: Methods: Explain/Verbal, Printed Responses: State content correctly Electronic Signature(s) Signed: 12/28/2020 4:37:51 PM By: [name] Signed: 12/28/2020 4:37:51 PM By: [name] Entered By: [name] on 12/28/2020 14:52:34 -------------------------------------------------------------------------------- Wound Assessment Details Patient Name: Date of Service: [name], [name] 12/28/2020 2:30 PM Medical Record Number: [id number] Patient Account Number: [id number] Date of Birth/Sex: Treating RN: Sep 13, 1970 (50 y.o. [name] Primary Care Evyn Putzier: [name] Other Clinician: Referring Graceson Nichelson: Treating Rondy Krupinski/Extender: Sammuel Bailiff in Treatment: 9 Wound Status Wound Number: 4 Primary Venous Leg Ulcer Etiology: Wound Location: Left, Medial Lower Leg Wound Open Wounding Event: Gradually Appeared Status: Date Acquired: 09/26/2020 Comorbid Sleep Apnea, Congestive Heart Failure, Coronary Artery Weeks Of Treatment: 9 History: Disease, Hypertension Clustered Wound: No Photos Wound Measurements Length: (cm) 0.5 Width: (cm) 2.7 Depth: (cm) 0.1 Area: (cm) 1.06 Volume: (cm) 0.106 % Reduction in Area: 93.5% % Reduction in Volume: 93.5% Epithelialization: Medium (34-66%) Tunneling: No Undermining: No Wound Description Classification: Full Thickness Without Exposed Support Structures Wound Margin: Distinct, outline attached Exudate Amount: Medium Exudate Type: Serosanguineous Exudate Color: red, brown Foul Odor After Cleansing: No Slough/Fibrino No Wound Bed Granulation Amount: Large (67-100%) Exposed Structure Granulation Quality: Red Fascia Exposed: No Necrotic Amount: None Present (0%) Fat Layer (Subcutaneous Tissue) Exposed: Yes Tendon Exposed: No Muscle Exposed: No Joint Exposed: No Bone Exposed:  No Treatment Notes Wound #4 (Lower Leg) Wound Laterality: Left, Medial Cleanser Soap and Water Discharge Instruction: May shower and wash wound with dial antibacterial soap and water prior to dressing change. Peri-Wound Care Sween Lotion (Moisturizing lotion) Discharge Instruction: Apply moisturizing lotion as directed Topical Primary Dressing Hydrofera Blue Ready Foam, 4x5 in Discharge Instruction: Apply to wound bed as instructed Secondary Dressing Woven Gauze Sponge, Non-Sterile 4x4 in Discharge Instruction: Apply over primary dressing as directed. ABD Pad, 8x10 Discharge Instruction: Apply over primary dressing as directed. Secured With Compression Wrap Unnaboot w/Calamine, 4x10 (in/yd) Discharge Instruction: unna at top of wrap to help secure CoFlex TLC XL 2-layer Compression System 4x7 (in/yd) Discharge Instruction: Apply CoFlex 2-layer compression as directed. (alt for 4 layer) Compression Stockings Add-Ons Electronic Signature(s) Signed: 12/28/2020 4:37:51 PM By: [name] Entered By: [name] on 12/28/2020 14:51:02 -------------------------------------------------------------------------------- Vitals Details Patient Name: Date of Service: [name], [name] 12/28/2020 2:30 PM Medical Record Number: [id number] Patient Account Number: [id number] Date of Birth/Sex: Treating RN: 04-Jun-1970 (50 y.o. [name] Primary Care Quantarius Genrich: [name] Other Clinician: Referring Teigan Manner: Treating Mckenzee Beem/Extender: Sammuel Bailiff in Treatment: 9 Vital Signs Time Taken: 14:27 Temperature (F): 97.8 Height (in): 67 Pulse (bpm): 97 Weight (lbs): 346 Respiratory Rate (breaths/min): 18 Body Mass Index (BMI): 54.2 Blood Pressure (mmHg): 130/82 Reference Range: 80 - 120 mg / dl Electronic Signature(s) Signed: 12/28/2020 4:37:51 PM By: [name] Entered By: [name] on 12/28/2020 14:27:58

## 2020-12-29 ENCOUNTER — Encounter: Payer: Self-pay | Admitting: Internal Medicine

## 2020-12-29 ENCOUNTER — Ambulatory Visit: Payer: Medicaid Other | Attending: Internal Medicine | Admitting: Internal Medicine

## 2020-12-29 VITALS — BP 110/80 | HR 73 | Resp 16 | Wt 350.0 lb

## 2020-12-29 DIAGNOSIS — I10 Essential (primary) hypertension: Secondary | ICD-10-CM

## 2020-12-29 DIAGNOSIS — Z713 Dietary counseling and surveillance: Secondary | ICD-10-CM | POA: Diagnosis not present

## 2020-12-29 DIAGNOSIS — F419 Anxiety disorder, unspecified: Secondary | ICD-10-CM | POA: Diagnosis not present

## 2020-12-29 DIAGNOSIS — Z7901 Long term (current) use of anticoagulants: Secondary | ICD-10-CM | POA: Diagnosis not present

## 2020-12-29 DIAGNOSIS — I2510 Atherosclerotic heart disease of native coronary artery without angina pectoris: Secondary | ICD-10-CM | POA: Insufficient documentation

## 2020-12-29 DIAGNOSIS — Z6841 Body Mass Index (BMI) 40.0 and over, adult: Secondary | ICD-10-CM | POA: Diagnosis not present

## 2020-12-29 DIAGNOSIS — Z79899 Other long term (current) drug therapy: Secondary | ICD-10-CM | POA: Insufficient documentation

## 2020-12-29 DIAGNOSIS — Z7984 Long term (current) use of oral hypoglycemic drugs: Secondary | ICD-10-CM | POA: Insufficient documentation

## 2020-12-29 DIAGNOSIS — Z560 Unemployment, unspecified: Secondary | ICD-10-CM | POA: Diagnosis not present

## 2020-12-29 DIAGNOSIS — E669 Obesity, unspecified: Secondary | ICD-10-CM

## 2020-12-29 DIAGNOSIS — E1169 Type 2 diabetes mellitus with other specified complication: Secondary | ICD-10-CM | POA: Diagnosis not present

## 2020-12-29 DIAGNOSIS — Z7902 Long term (current) use of antithrombotics/antiplatelets: Secondary | ICD-10-CM | POA: Diagnosis not present

## 2020-12-29 DIAGNOSIS — J4540 Moderate persistent asthma, uncomplicated: Secondary | ICD-10-CM | POA: Insufficient documentation

## 2020-12-29 DIAGNOSIS — Z87891 Personal history of nicotine dependence: Secondary | ICD-10-CM | POA: Diagnosis not present

## 2020-12-29 DIAGNOSIS — Z86718 Personal history of other venous thrombosis and embolism: Secondary | ICD-10-CM | POA: Diagnosis not present

## 2020-12-29 DIAGNOSIS — I5032 Chronic diastolic (congestive) heart failure: Secondary | ICD-10-CM | POA: Diagnosis not present

## 2020-12-29 DIAGNOSIS — I110 Hypertensive heart disease with heart failure: Secondary | ICD-10-CM | POA: Insufficient documentation

## 2020-12-29 DIAGNOSIS — R300 Dysuria: Secondary | ICD-10-CM

## 2020-12-29 DIAGNOSIS — Z01818 Encounter for other preprocedural examination: Secondary | ICD-10-CM | POA: Diagnosis not present

## 2020-12-29 DIAGNOSIS — Z7951 Long term (current) use of inhaled steroids: Secondary | ICD-10-CM | POA: Diagnosis not present

## 2020-12-29 MED ORDER — DAPAGLIFLOZIN PROPANEDIOL 10 MG PO TABS: 10.0000 mg | ORAL_TABLET | Freq: Every day | ORAL | 6 refills | Status: DC

## 2020-12-29 NOTE — Patient Instructions (Signed)
Try to limit salt in the food is much as possible.

## 2020-12-29 NOTE — Progress Notes (Signed)
  Patient ID: [name], female    DOB: 10/01/1970  MRN: [id number]  CC: Medical Clearance (Patient also has questions about her medication refills.)   Subjective: [name] is a 50 y.o. 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  EGD and colonoscopy negative, chronic ulcer left lower extremity, asthma  Patient will be having dental procedure including deep cleaning and dental fillings.  Local anesthetic agent will be use.  Her dentist wanted clearance because of patient's chronic medical conditions including the fact that she is on an anticoagulant.  Patient denies any problems with significant bleeding with prior dental cleanings.  DM Lab Results  Component Value Date   HGBA1C 6.5 (H) 11/24/2020  New diagnosis of diabetes on hospitalization in July.  She is on Farxiga.  She tells me she is taking it consistently.  She checks her blood sugars about once every 3 days.  She gives a range of 100-147.  Reports having had an eye exam in March of this year and got new glasses.  Denies any numbness in the feet.  HTN/CHF: Blood pressure elevated today.  Has taken her medications already for today including carvedilol, spironolactone, amlodipine and Diovan.  She limits salt in the foods. Denies any PND orthopnea.  No palpitations.  No lower extremity edema.  No chest pains.  She has a sleep study scheduled on the 19th of this month.  Complains of noticing a little dysuria 3 days ago.  No blood in the urine.   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 60 tablet 0   albuterol (PROAIR HFA) 108 (90 Base) MCG/ACT inhaler INHALE TWO PUFFS BY MOUTH EVERY 6 HOURS AS NEEDED FOR WHEEZING OR SHORTNESS OF BREATH 8.5 g 0   amLODipine (NORVASC) 5 MG tablet Take 0.5 tablets (2.5 mg total) by mouth daily. 30 tablet 2   apixaban (ELIQUIS) 5 MG TABS tablet Take 1 tablet (5 mg total) by mouth 2 (two) times daily. 60 tablet 3   atorvastatin (LIPITOR) 40 MG tablet Take 1 tablet (40 mg total) by mouth daily. 90 tablet 1   Blood Glucose Calibration (ACCU-CHEK GUIDE CONTROL) LIQD 1   each by In Vitro route daily. 1 each 4   Blood Glucose Monitoring Suppl (ACCU-CHEK GUIDE) w/Device KIT 1 each by Does not apply route 2 (two) times daily. 1 kit 0   Carboxymethylcellulose Sodium (EYE DROPS OP) Place 1 drop into both eyes daily as needed (dry eyes).     carvedilol (COREG) 25 MG tablet TAKE ONE TABLET BY MOUTH TWICE A DAY WITH MEALS 60 tablet 0   cholecalciferol (VITAMIN D3) 25 MCG (1000 UNIT) tablet Take 1 tablet (1,000 Units total) by mouth  daily. 30 tablet 5   doxycycline (VIBRA-TABS) 100 MG tablet Take 1 tablet (100 mg total) by mouth 2 (two) times daily. 20 tablet 0   furosemide (LASIX) 80 MG tablet Take 1 tablet (80 mg total) by mouth daily. 30 tablet 0   gabapentin (NEURONTIN) 100 MG capsule Take 1 capsule (100 mg total) by mouth 3 (three) times daily as needed (pain). 90 capsule 2   glucose blood test strip Use as instructed 100 each 12   hydrocortisone cream 1 % Apply 1 application topically daily as needed for itching.     KLOR-CON M20 20 MEQ tablet TAKE ONE TABLET BY MOUTH DAILY 30 tablet 0   mometasone-formoterol (DULERA) 200-5 MCG/ACT AERO Inhale 2 puffs into the lungs 2 (two) times daily. 1 each 2   nitroGLYCERIN (NITROSTAT) 0.4 MG SL tablet Place 1 tablet (0.4 mg total) under the tongue every 5 (five) minutes as needed for chest pain. 30 tablet 0   omeprazole (PRILOSEC) 20 MG capsule Take 20 mg by mouth daily as needed (acid reflux).     PARoxetine (PAXIL) 10 MG tablet TAKE ONE TABLET BY MOUTH DAILY 30 tablet 0   spironolactone (ALDACTONE) 25 MG tablet TAKE ONE TABLET BY MOUTH DAILY 30 tablet 0   traMADol (ULTRAM) 50 MG tablet Take 1 tablet (50 mg total) by mouth every 6 (six) hours as needed for moderate pain. 14 tablet 0   valsartan (DIOVAN) 80 MG tablet Take 1 tablet (80 mg total) by mouth daily. 90 tablet 3   vitamin B-12 (CYANOCOBALAMIN) 1000 MCG tablet Take 1,000 mcg by mouth daily.     methocarbamol (ROBAXIN) 500 MG tablet TAKE ONE TABLET BY MOUTH DAILY AS NEEDED FOR MUSCLE SPASMS (Patient not taking: Reported on 12/29/2020) 60 tablet 0   No current facility-administered medications on file prior to visit.    Allergies  Allergen Reactions   Ace Inhibitors Rash and Other (See Comments)    Make pt bleed   Aspirin Other (See Comments)    Per patient paperwork: blood clot?  Likely because of chronic DOAC   Hydromorphone Hives and Itching   Vancomycin Itching and Rash   Contrast Media [Iodinated Diagnostic  Agents] Hives   Dilaudid [Hydromorphone Hcl] Hives    Social History   Socioeconomic History   Marital status: Single    Spouse name: Not on file   Number of children: 0   Years of education: Not on file   Highest education level: 12th grade  Occupational History   Occupation: unemployed on disablity  Tobacco Use   Smoking status: Former   Smokeless tobacco: Never  Vaping Use   Vaping Use: Never used  Substance and Sexual Activity   Alcohol use: Not Currently   Drug use: Not Currently   Sexual activity: Not Currently  Other Topics Concern   Not on file  Social History Narrative   Not on file   Social Determinants of Health   Financial   Resource Strain: Not on file  Food Insecurity: No Food Insecurity   Worried About Charity fundraiser in the Last Year: Never true   Ran Out of Food in the Last Year: Never true  Transportation Needs: No Transportation Needs   Lack of Transportation (Medical): No   Lack of Transportation (Non-Medical): No  Physical Activity: Not on file  Stress: Not on file  Social Connections: Not on file  Intimate Partner Violence: Not on file    Family History  Problem Relation Age of Onset   Diabetes Mellitus II Mother    COPD Father    Diabetes Father    Diabetes Mellitus II Maternal Grandmother    Breast cancer Paternal Grandfather     Past Surgical History:  Procedure Laterality Date   ABDOMINAL WALL DEFECT REPAIR  1970   IR CV LINE INJECTION  10/24/2020   IVC FILTER INSERTION  2017   [place]  2014    ROS: Review of Systems Negative except as stated above  PHYSICAL EXAM: BP 110/80   Pulse 73   Resp 16   Wt (!) 350 lb (158.8 kg)   SpO2 100%   BMI 56.49 kg/m   Wt Readings from Last 3 Encounters:  12/29/20 (!) 350 lb (158.8 kg)  12/06/20 (!) 329 lb (149.2 kg)  11/29/20 (!) 339 lb 6.4 oz (154 kg)    Physical Exam  General appearance - alert, well appearing, obese  middle-age African-American female and in no distress Mental status -patient answers questions appropriately. Neck - supple, no significant adenopathy Chest - clear to auscultation, no wheezes, rales or rhonchi, symmetric air entry Heart - normal rate, regular rhythm, normal S1, S2, no murmurs, rubs, clicks or gallops Extremities -trace lower extremity edema.   CMP Latest Ref Rng & Units 11/29/2020 11/27/2020 11/25/2020  Glucose 65 - 99 mg/dL 208(H) 263(H) 205(H)  BUN 6 - 24 mg/dL 12 33(H) 25(H)  Creatinine 0.57 - 1.00 mg/dL 0.77 0.96 0.84  Sodium 134 - 144 mmol/L 138 131(L) 134(L)  Potassium 3.5 - 5.2 mmol/L 4.6 4.0 3.8  Chloride 96 - 106 mmol/L 96 90(L) 94(L)  CO2 20 - 29 mmol/L 29 32 32  Calcium 8.7 - 10.2 mg/dL 9.4 9.3 9.2  Total Protein 6.0 - 8.5 g/dL 7.0 - -  Total Bilirubin 0.0 - 1.2 mg/dL 0.7 - -  Alkaline Phos 44 - 121 IU/L 77 - -  AST 0 - 40 IU/L 10 - -  ALT 0 - 32 IU/L 9 - -   Lipid Panel  No results found for: CHOL, TRIG, HDL, CHOLHDL, VLDL, LDLCALC, LDLDIRECT  CBC    Component Value Date/Time   WBC 8.6 11/29/2020 1445   WBC 11.1 (H) 11/25/2020 0425   RBC 4.10 11/29/2020 1445   RBC 3.76 (L) 11/25/2020 0425   HGB 10.9 (L) 11/29/2020 1445   HCT 36.1 11/29/2020 1445   PLT 165 11/29/2020 1445   MCV 88 11/29/2020 1445   MCH 26.6 11/29/2020 1445   MCH 27.7 11/25/2020 0425   MCHC 30.2 (L) 11/29/2020 1445   MCHC 30.5 11/25/2020 0425   RDW 13.1 11/29/2020 1445   LYMPHSABS 0.5 (L) 11/29/2020 1445   MONOABS 0.6 11/25/2020 0425   EOSABS 0.0 11/29/2020 1445   BASOSABS 0.0 11/29/2020 1445   Results for orders placed or performed during the hospital encounter of 12/22/20  Blood gas, arterial  Result Value Ref Range   FIO2  21.00    pH, Arterial 7.388 7.350 - 7.450   pCO2 arterial 42.3 32.0 - 48.0 mmHg   pO2, Arterial 80.1 (L) 83.0 - 108.0 mmHg   Bicarbonate 24.9 20.0 - 28.0 mmol/L   Acid-Base Excess 0.5 0.0 - 2.0 mmol/L   O2 Saturation 95.5 %   Patient temperature 37.0     Collection site RIGHT RADIAL    Drawn by [id number]    Sample type ARTERIAL DRAW    Allens test (pass/fail) PASS PASS   Results for orders placed or performed in visit on 12/29/20  Microalbumin / creatinine urine ratio  Result Value Ref Range   Creatinine, Urine 249.8 Not Estab. mg/dL   Microalbumin, Urine 27.7 Not Estab. ug/mL   Microalb/Creat Ratio 11 0 - 29 mg/g creat  POCT URINALYSIS DIP (CLINITEK)  Result Value Ref Range   Color, UA yellow yellow   Clarity, UA clear clear   Glucose, UA negative negative mg/dL   Bilirubin, UA negative negative   Ketones, POC UA negative negative mg/dL   Spec Grav, UA >=1.030 (A) 1.010 - 1.025   Blood, UA negative negative   pH, UA 5.5 5.0 - 8.0   POC PROTEIN,UA =30 (A) negative, trace   Urobilinogen, UA 0.2 0.2 or 1.0 E.U./dL   Nitrite, UA Negative Negative   Leukocytes, UA Negative Negative     ASSESSMENT AND PLAN: 1. Preoperative evaluation to rule out surgical contraindication Patient will be having deep teeth cleaning and dental fillings with local anesthetic agent.  This is considered a low bleeding risk procedure.  I told her to hold Eliquis for 2-3 doses which means for 1 to 1-1/2 days prior to the procedure.  She expressed understanding.   2. Essential hypertension At goal and stable on current medications.  3. Type 2 diabetes mellitus with obesity (HCC) Continue Farxiga.  Dietary counseling given.  She is agreeable to seeing a nutritionist. - dapagliflozin propanediol (FARXIGA) 10 MG TABS tablet; Take 1 tablet (10 mg total) by mouth daily.  Dispense: 30 tablet; Refill: 6 - Amb ref to Medical Nutrition Therapy-MNT - Microalbumin / creatinine urine ratio  4. Diastolic CHF, chronic (HCC) Compensated.  Continue current medications including furosemide - dapagliflozin propanediol (FARXIGA) 10 MG TABS tablet; Take 1 tablet (10 mg total) by mouth daily.  Dispense: 30 tablet; Refill: 6  5. Dysuria Point-of-care urine today is  negative. - POCT URINALYSIS DIP (CLINITEK)   Patient was given the opportunity to ask questions.  Patient verbalized understanding of the plan and was able to repeat key elements of the plan.   Orders Placed This Encounter  Procedures   Microalbumin / creatinine urine ratio   Amb ref to Medical Nutrition Therapy-MNT   POCT URINALYSIS DIP (CLINITEK)     Requested Prescriptions   Signed Prescriptions Disp Refills   dapagliflozin propanediol (FARXIGA) 10 MG TABS tablet 30 tablet 6    Sig: Take 1 tablet (10 mg total) by mouth daily.    No follow-ups on file.  Deborah Vanessen, MD, FACP  

## 2020-12-30 LAB — MICROALBUMIN / CREATININE URINE RATIO
Creatinine, Urine: 249.8 mg/dL
Microalb/Creat Ratio: 11 mg/g creat (ref 0–29)
Microalbumin, Urine: 27.7 ug/mL

## 2021-01-01 ENCOUNTER — Other Ambulatory Visit: Payer: Self-pay | Admitting: Internal Medicine

## 2021-01-01 DIAGNOSIS — F411 Generalized anxiety disorder: Secondary | ICD-10-CM

## 2021-01-02 ENCOUNTER — Telehealth: Payer: Self-pay | Admitting: Internal Medicine

## 2021-01-02 ENCOUNTER — Ambulatory Visit (INDEPENDENT_AMBULATORY_CARE_PROVIDER_SITE_OTHER): Payer: Medicaid Other | Admitting: Vascular Surgery

## 2021-01-02 ENCOUNTER — Other Ambulatory Visit: Payer: Self-pay

## 2021-01-02 VITALS — BP 119/84 | HR 88 | Ht 66.0 in | Wt 349.0 lb

## 2021-01-02 DIAGNOSIS — I82562 Chronic embolism and thrombosis of left calf muscular vein: Secondary | ICD-10-CM | POA: Diagnosis not present

## 2021-01-02 DIAGNOSIS — Z95828 Presence of other vascular implants and grafts: Secondary | ICD-10-CM

## 2021-01-02 DIAGNOSIS — L97221 Non-pressure chronic ulcer of left calf limited to breakdown of skin: Secondary | ICD-10-CM | POA: Diagnosis not present

## 2021-01-02 DIAGNOSIS — I890 Lymphedema, not elsewhere classified: Secondary | ICD-10-CM | POA: Diagnosis not present

## 2021-01-02 DIAGNOSIS — I10 Essential (primary) hypertension: Secondary | ICD-10-CM

## 2021-01-02 DIAGNOSIS — I5032 Chronic diastolic (congestive) heart failure: Secondary | ICD-10-CM

## 2021-01-02 LAB — POCT URINALYSIS DIP (CLINITEK)
Bilirubin, UA: NEGATIVE
Blood, UA: NEGATIVE
Glucose, UA: NEGATIVE mg/dL
Ketones, POC UA: NEGATIVE mg/dL
Leukocytes, UA: NEGATIVE
Nitrite, UA: NEGATIVE
POC PROTEIN,UA: 30 — AB
Spec Grav, UA: >=1.03 — AB (ref 1.010–1.025)
Urobilinogen, UA: 0.2 E.U./dL
pH, UA: 5.5 (ref 5.0–8.0)

## 2021-01-02 NOTE — Assessment & Plan Note (Signed)
The patient is using lymphedema pump and this does feel good, but she continues to have significant swelling.  With her size and her previous history of DVT and severe lymphedema, that is not surprising entirely.  At this point, she will continue her conservative therapies.  If her right leg swelling worsens, she may have to consider an Unna boot on that side as well.  This seems to be working well for her left side.

## 2021-01-02 NOTE — Telephone Encounter (Signed)
Medication Refill - Medication: traMADol (ULTRAM) 50 MG tablet   doxycycline (VIBRA-TABS) 100 MG tablet   furosemide (LASIX) 80 MG tablet   valsartan (DIOVAN) 80 MG tablet   Has the patient contacted their pharmacy? Yes.   (Agent: If no, request that the patient contact the pharmacy for the refill.) (Agent: If yes, when and what did the pharmacy advise?)they only requested one medication   Preferred Pharmacy (with phone number or street name): [facility] JY:[id number] [place] [address]  Phone:  [phone]  Fax:  [phone]   Agent: Please be advised that RX refills may take up to 3 business days. We ask that you follow-up with your pharmacy.

## 2021-01-02 NOTE — Telephone Encounter (Signed)
Requested Prescriptions  Pending Prescriptions Disp Refills  . PARoxetine (PAXIL) 10 MG tablet [Pharmacy Med Name: PARoxetine HCL 10 MG TABLET] 30 tablet 0    Sig: TAKE ONE TABLET BY MOUTH DAILY     Psychiatry:  Antidepressants - SSRI Passed - 01/02/2021 12:32 PM      Passed - Valid encounter within last 6 months    Recent Outpatient Visits          4 days ago Preoperative evaluation to rule out surgical contraindication   Neligh [name] B, MD   1 month ago Type 2 diabetes mellitus with hyperglycemia, unspecified whether long term insulin use [facility])   [place], [place], [place]   6 months ago Chronic midline thoracic back pain   [place], [place]   9 months ago Establishing care with new doctor, encounter for   [place], MD      Future Appointments            In 1 month Ellyn Hack, Leonie Green, MD [facility] [place], [facility]   In 1 month Ladell Pier, MD [place]

## 2021-01-02 NOTE — Progress Notes (Signed)
MRN : [id number]  [name] is a 50 y.o. (1970-10-11) female who presents with chief complaint of  Chief Complaint  Patient presents with   Follow-up    6 Mo no studies  .  History of Present Illness: Patient returns today in follow up of her lymphedema and leg swelling.  She is back in [employer] [employer] under the direction of the wound care center for an ulcer on the left leg.  She has an upcoming trip where she will be on train.  She has been using her lymphedema pump and this has helped some.  She also is interested in having her IVC filter removed.  No new complaints.  No fevers or chills.  No chest pain or shortness of breath.  Current Outpatient Medications  Medication Sig Dispense Refill   Accu-Chek Softclix Lancets lancets Use as instructed 100 each 12   acetaminophen (TYLENOL) 500 MG tablet Take 1 tablet (500 mg total) by mouth every 6 (six) hours as needed. 60 tablet 0   albuterol (PROAIR HFA) 108 (90 Base) MCG/ACT inhaler INHALE TWO PUFFS BY MOUTH EVERY 6 HOURS AS NEEDED FOR WHEEZING OR SHORTNESS OF BREATH 8.5 g 0   amLODipine (NORVASC) 5 MG tablet Take 0.5 tablets (2.5 mg total) by mouth daily. 30 tablet 2   apixaban (ELIQUIS) 5 MG TABS tablet Take 1 tablet (5 mg total) by mouth 2 (two) times daily. 60 tablet 3   atorvastatin (LIPITOR) 40 MG tablet Take 1 tablet (40 mg total) by mouth daily. 90 tablet 1   Blood Glucose Calibration (ACCU-CHEK GUIDE CONTROL) LIQD 1 each by In Vitro route daily. 1 each 4   Blood Glucose Monitoring Suppl (ACCU-CHEK GUIDE) w/Device KIT 1 each by Does not apply route 2 (two) times daily. 1 kit 0   Carboxymethylcellulose Sodium (EYE DROPS OP) Place 1 drop into both eyes daily as needed (dry eyes).     carvedilol (COREG) 25 MG tablet TAKE ONE TABLET BY MOUTH TWICE A DAY WITH MEALS 60 tablet 0   cholecalciferol (VITAMIN D3) 25 MCG (1000 UNIT) tablet Take 1 tablet (1,000 Units total) by mouth daily. 30 tablet 5   dapagliflozin propanediol  (FARXIGA) 10 MG TABS tablet Take 1 tablet (10 mg total) by mouth daily. 30 tablet 6   doxycycline (VIBRA-TABS) 100 MG tablet Take 1 tablet (100 mg total) by mouth 2 (two) times daily. 20 tablet 0   furosemide (LASIX) 80 MG tablet Take 1 tablet (80 mg total) by mouth daily. 30 tablet 0   gabapentin (NEURONTIN) 100 MG capsule Take 1 capsule (100 mg total) by mouth 3 (three) times daily as needed (pain). 90 capsule 2   glucose blood test strip Use as instructed 100 each 12   hydrocortisone cream 1 % Apply 1 application topically daily as needed for itching.     KLOR-CON M20 20 MEQ tablet TAKE ONE TABLET BY MOUTH DAILY 30 tablet 0   methocarbamol (ROBAXIN) 500 MG tablet TAKE ONE TABLET BY MOUTH DAILY AS NEEDED FOR MUSCLE SPASMS 60 tablet 0   mometasone-formoterol (DULERA) 200-5 MCG/ACT AERO Inhale 2 puffs into the lungs 2 (two) times daily. 1 each 2   nitroGLYCERIN (NITROSTAT) 0.4 MG SL tablet Place 1 tablet (0.4 mg total) under the tongue every 5 (five) minutes as needed for chest pain. 30 tablet 0   omeprazole (PRILOSEC) 20 MG capsule Take 20 mg by mouth daily as needed (acid reflux).     PARoxetine (PAXIL) 10 MG  tablet TAKE ONE TABLET BY MOUTH DAILY 30 tablet 0   spironolactone (ALDACTONE) 25 MG tablet TAKE ONE TABLET BY MOUTH DAILY 30 tablet 0   traMADol (ULTRAM) 50 MG tablet Take 1 tablet (50 mg total) by mouth every 6 (six) hours as needed for moderate pain. 14 tablet 0   valsartan (DIOVAN) 80 MG tablet Take 1 tablet (80 mg total) by mouth daily. 90 tablet 3   vitamin B-12 (CYANOCOBALAMIN) 1000 MCG tablet Take 1,000 mcg by mouth daily.     No current facility-administered medications for this visit.    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   Morbid obesity due to excess calories (HCC)    Osteoarthritis of left knee    Prediabetes    Small bowel obstruction ([place])    as a child   Speech impediment    Stutter / stammer    Past Surgical History:  Procedure Laterality Date   ABDOMINAL WALL DEFECT REPAIR  1970   IR CV LINE INJECTION  10/24/2020   IVC FILTER INSERTION  2017   [place]  2014     Social History   Tobacco Use   Smoking status: Former   Smokeless tobacco: Never  Scientific laboratory technician Use: Never used  Substance Use Topics   Alcohol use: Not Currently   Drug use: Not Currently      Family History  Problem Relation Age of Onset   Diabetes Mellitus II Mother    COPD Father    Diabetes Father    Diabetes Mellitus II Maternal Grandmother    Breast cancer Paternal Grandfather      Allergies  Allergen Reactions   Ace Inhibitors Rash and Other (See Comments)    Make pt bleed   Aspirin Other (See Comments)    Per patient paperwork: blood clot?  Likely because of chronic DOAC   Hydromorphone Hives and Itching   Vancomycin Itching and Rash   Contrast Media [Iodinated Diagnostic Agents] Hives   Dilaudid [Hydromorphone Hcl] Hives     REVIEW OF SYSTEMS (Negative unless checked)   Constitutional: []Weight loss  []Fever  []Chills Cardiac: []Chest pain   []Chest pressure   []Palpitations   []Shortness of breath when laying flat   []Shortness of breath at rest   []Shortness of breath with  exertion. Vascular:  [x]Pain in legs with walking   [x]Pain in legs at rest   [x]Pain in legs when laying flat   []Claudication   []Pain in feet when walking  []Pain in feet at rest  []Pain in feet when laying flat   [x]History of DVT   [x]Phlebitis   []Swelling  in legs   []Varicose veins   [x]Non-healing ulcers Pulmonary:   []Uses home oxygen   []Productive cough   []Hemoptysis   []Wheeze  []COPD   []Asthma Neurologic:  []Dizziness  []Blackouts   []Seizures   []History of stroke   []History of TIA  []Aphasia   []Temporary blindness   []Dysphagia   []Weakness or numbness in arms   []Weakness or numbness in legs Musculoskeletal:  [x]Arthritis   []Joint swelling   []Joint pain   []Low back pain Hematologic:  []Easy bruising  []Easy bleeding   []Hypercoagulable state   []Anemic  []Hepatitis Gastrointestinal:  []Blood in stool   []Vomiting blood  []Gastroesophageal reflux/heartburn   []Abdominal pain Genitourinary:  []Chronic kidney disease   []Difficult urination  []Frequent urination  []Burning with urination   []Hematuria Skin:  []Rashes   [x]Ulcers   [x]Wounds Psychological:  []History of anxiety   [] History of major depression.  Physical Examination  BP 119/84   Pulse 88   Ht 5' 6" (1.676 m)   Wt (!) 349 lb (158.3 kg)   BMI 56.33 kg/m  Gen:  WD/WN, NAD Head: [place]/AT, No temporalis wasting. Ear/Nose/Throat: Hearing grossly intact, nares w/o erythema or drainage Eyes: Conjunctiva clear. Sclera non-icteric Neck: Supple.  Trachea midline Pulmonary:  Good air movement, no use of accessory muscles.  Cardiac: RRR, no JVD Vascular:  Vessel Right Left  Radial Palpable Palpable           Musculoskeletal: M/S 5/5 throughout.  No deformity or atrophy.  Left leg is in an Haematologist for an ulcer.  2+ bilateral lower extremity edema. Neurologic: Sensation grossly intact in extremities.  Symmetrical.  Speech is fluent.  Psychiatric: Judgment intact, Mood & affect appropriate for pt's clinical  situation. Dermatologic: Left leg is wrapped in [employer] Recent Results (from the past 2160 hour(s))  Comprehensive metabolic panel     Status: Abnormal   Collection Time: 11/22/20  8:21 PM  Result Value Ref Range   Sodium 135 135 - 145 mmol/L   Potassium 3.5 3.5 - 5.1 mmol/L   Chloride 102 98 - 111 mmol/L   CO2 26 22 - 32 mmol/L   Glucose, Bld 155 (H) 70 - 99 mg/dL    Comment: Glucose reference range applies only to samples taken after fasting for at least 8 hours.   BUN 10 6 - 20 mg/dL   Creatinine, Ser 0.84 0.44 - 1.00 mg/dL   Calcium 8.9 8.9 - 10.3 mg/dL   Total Protein 7.1 6.5 - 8.1 g/dL   Albumin 3.3 (L) 3.5 - 5.0 g/dL   AST 16 15 - 41 U/L   ALT 11 0 - 44 U/L   Alkaline Phosphatase 69 38 - 126 U/L   Total Bilirubin 0.9 0.3 - 1.2 mg/dL   GFR, Estimated >60 >60 mL/min    Comment: (NOTE) Calculated using the CKD-EPI Creatinine Equation (2021)    Anion gap 7 5 - 15    Comment: Performed at [facility] [address] [place] [address]  CBC     Status: Abnormal   Collection Time: 11/22/20  8:21 PM  Result Value Ref Range   WBC 6.4 4.0 - 10.5 K/uL   RBC 3.72 (L) 3.87 - 5.11 MIL/uL   Hemoglobin 10.2 (L) 12.0 - 15.0 g/dL   HCT 35.0 (L) 36.0 - 46.0 %   MCV 94.1 80.0 - 100.0 fL   MCH 27.4 26.0 -  34.0 pg   MCHC 29.1 (L) 30.0 - 36.0 g/dL   RDW 14.2 11.5 - 15.5 %   Platelets 117 (L) 150 - 400 K/uL    Comment: Immature Platelet Fraction may be clinically indicated, consider ordering this additional test [id number] REPEATED TO VERIFY PLATELET COUNT CONFIRMED BY SMEAR    nRBC 0.0 0.0 - 0.2 %    Comment: Performed at [facility], [place] [name], [place], [place] [address]  Troponin I (High Sensitivity)     Status: None   Collection Time: 11/22/20  8:21 PM  Result Value Ref Range   Troponin I (High Sensitivity) 8 <18 ng/L    Comment: (NOTE) Elevated high sensitivity troponin I (hsTnI) values and significant  changes across serial  measurements may suggest ACS but many other  chronic and acute conditions are known to elevate hsTnI results.  Refer to the "Links" section for chest pain algorithms and additional  guidance. Performed at [facility], [place] [address] [place], [place] [address]   Blood culture (routine x 2)     Status: None   Collection Time: 11/22/20  8:25 PM   Specimen: BLOOD  Result Value Ref Range   Specimen Description BLOOD LEFT ANTECUBITAL    Special Requests      BOTTLES DRAWN AEROBIC AND ANAEROBIC Blood Culture adequate volume   Culture      NO GROWTH 5 DAYS Performed at [facility], [place] [address] [place], [place] [address]    Report Status 11/27/2020 FINAL   Resp Panel by RT-PCR (Flu A&B, Covid) Nasopharyngeal Swab     Status: None   Collection Time: 11/22/20  8:30 PM   Specimen: Nasopharyngeal Swab; Nasopharyngeal(NP) swabs in vial transport medium  Result Value Ref Range   SARS Coronavirus 2 by RT PCR NEGATIVE NEGATIVE    Comment: (NOTE) SARS-CoV-2 target nucleic acids are NOT DETECTED.  The SARS-CoV-2 RNA is generally detectable in upper respiratory specimens during the acute phase of infection. The lowest concentration of SARS-CoV-2 viral copies this assay can detect is 138 copies/mL. A negative result does not preclude SARS-Cov-2 infection and should not be used as the sole basis for treatment or other patient management decisions. A negative result may occur with  improper specimen collection/handling, submission of specimen other than nasopharyngeal swab, presence of viral mutation(s) within the areas targeted by this assay, and inadequate number of viral copies(<138 copies/mL). A negative result must be combined with clinical observations, patient history, and epidemiological information. The expected result is Negative.  Fact Sheet for Patients:  EntrepreneurPulse.com.au  Fact Sheet for Healthcare Providers:   IncredibleEmployment.be  This test is no t yet approved or cleared by the [place] FDA and  has been authorized for detection and/or diagnosis of SARS-CoV-2 by FDA under an Emergency Use Authorization (EUA). This EUA will remain  in effect (meaning this test can be used) for the duration of the COVID-19 declaration under Section 564(b)(1) of the Act, 21 U.S.C.section 360bbb-3(b)(1), unless the authorization is terminated  or revoked sooner.       Influenza A by PCR NEGATIVE NEGATIVE   Influenza B by PCR NEGATIVE NEGATIVE    Comment: (NOTE) The Xpert Xpress SARS-CoV-2/FLU/RSV plus assay is intended as an aid in the diagnosis of influenza from Nasopharyngeal swab specimens and should not be used as a sole basis for treatment. Nasal washings and aspirates are unacceptable for Xpert Xpress SARS-CoV-2/FLU/RSV testing.  Fact Sheet for Patients: EntrepreneurPulse.com.au  Fact Sheet  for Healthcare Providers: IncredibleEmployment.be  This test is not yet approved or cleared by the [place] and has been authorized for detection and/or diagnosis of SARS-CoV-2 by FDA under an Emergency Use Authorization (EUA). This EUA will remain in effect (meaning this test can be used) for the duration of the COVID-19 declaration under Section 564(b)(1) of the Act, 21 U.S.C. section 360bbb-3(b)(1), unless the authorization is terminated or revoked.  Performed at [facility], [place] [address], [place], [place] [address]   Blood culture (routine x 2)     Status: None   Collection Time: 11/22/20  8:32 PM   Specimen: BLOOD  Result Value Ref Range   Specimen Description BLOOD LEFT ANTECUBITAL    Special Requests      BOTTLES DRAWN AEROBIC AND ANAEROBIC Blood Culture adequate volume   Culture      NO GROWTH 5 DAYS Performed at [facility], [place] [address] [place], [place] [address]    Report Status 11/27/2020 FINAL    I-Stat beta hCG blood, ED     Status: None   Collection Time: 11/22/20  8:50 PM  Result Value Ref Range   I-stat hCG, quantitative <5.0 <5 mIU/mL   Comment 3            Comment:   GEST. AGE      CONC.  (mIU/mL)   <=1 WEEK        5 - 50     2 WEEKS       50 - 500     3 WEEKS       100 - 10,000     4 WEEKS     1,000 - 30,000        FEMALE AND NON-PREGNANT FEMALE:     LESS THAN 5 mIU/mL   I-Stat arterial blood gas, [facility] ED)     Status: Abnormal   Collection Time: 11/22/20  8:51 PM  Result Value Ref Range   pH, Arterial 7.400 7.350 - 7.450   pCO2 arterial 44.9 32.0 - 48.0 mmHg   pO2, Arterial 181 (H) 83.0 - 108.0 mmHg   Bicarbonate 27.8 20.0 - 28.0 mmol/L   TCO2 29 22 - 32 mmol/L   O2 Saturation 100.0 %   Acid-Base Excess 3.0 (H) 0.0 - 2.0 mmol/L   Sodium 140 135 - 145 mmol/L   Potassium 3.4 (L) 3.5 - 5.1 mmol/L   Calcium, Ion 1.25 1.15 - 1.40 mmol/L   HCT 31.0 (L) 36.0 - 46.0 %   Hemoglobin 10.5 (L) 12.0 - 15.0 g/dL   Patient temperature 98.6 F    Collection site Radial    Drawn by Operator    Sample type ARTERIAL   Lactic acid, plasma     Status: Abnormal   Collection Time: 11/22/20  9:04 PM  Result Value Ref Range   Lactic Acid, Venous 2.1 (HH) 0.5 - 1.9 mmol/L    Comment: D. [name], RN. _0  [id number] BLANKENSHIP R.  CRITICAL RESULT CALLED TO, READ BACK BY AND VERIFIED WITH: Performed at [facility] [address] [place], [place] [address] CORRECTED ON 07/28 AT 2057: PREVIOUSLY REPORTED AS 2.1 D. [name], RN. _1  [id number] BLANKENSHIP R.    Urinalysis, Routine w reflex microscopic Urine, Catheterized     Status: None   Collection Time: 11/22/20 10:10 PM  Result Value Ref Range   Color, Urine YELLOW YELLOW   APPearance CLEAR CLEAR   Specific Gravity, Urine 1.025 1.005 -  1.030   pH 5.0 5.0 - 8.0   Glucose, UA NEGATIVE NEGATIVE mg/dL   Hgb urine dipstick NEGATIVE NEGATIVE   Bilirubin Urine NEGATIVE NEGATIVE   Ketones, ur NEGATIVE NEGATIVE mg/dL   Protein, ur  NEGATIVE NEGATIVE mg/dL   Nitrite NEGATIVE NEGATIVE   Leukocytes,Ua NEGATIVE NEGATIVE    Comment: Performed at [facility] [address] [place], [place] [address]  Troponin I (High Sensitivity)     Status: None   Collection Time: 11/22/20 10:17 PM  Result Value Ref Range   Troponin I (High Sensitivity) 11 <18 ng/L    Comment: (NOTE) Elevated high sensitivity troponin I (hsTnI) values and significant  changes across serial measurements may suggest ACS but many other  chronic and acute conditions are known to elevate hsTnI results.  Refer to the "Links" section for chest pain algorithms and additional  guidance. Performed at [facility], [place] [name], [place], [place] [address]   Lactic acid, plasma     Status: None   Collection Time: 11/22/20 10:21 PM  Result Value Ref Range   Lactic Acid, Venous 1.1 0.5 - 1.9 mmol/L    Comment: Performed at [facility] [address] [place], [place] [address]  D-dimer, quantitative     Status: Abnormal   Collection Time: 11/22/20 11:02 PM  Result Value Ref Range   D-Dimer, Quant 1.07 (H) 0.00 - 0.50 ug/mL-FEU    Comment: (NOTE) At the manufacturer cut-off value of 0.5 g/mL FEU, this assay has a negative predictive value of 95-100%.This assay is intended for use in conjunction with a clinical pretest probability (PTP) assessment model to exclude pulmonary embolism (PE) and deep venous thrombosis (DVT) in outpatients suspected of PE or DVT. Results should be correlated with clinical presentation. Performed at [facility], [place] [address] [place], [place] [address]   Brain natriuretic peptide     Status: None   Collection Time: 11/22/20 11:38 PM  Result Value Ref Range   B Natriuretic Peptide 46.6 0.0 - 100.0 pg/mL    Comment: Performed at [facility] [address] [place], [place] [address]  CBC     Status: Abnormal   Collection Time: 11/23/20  5:37 AM  Result Value Ref Range   WBC 9.5 4.0 -  10.5 K/uL   RBC 3.87 3.87 - 5.11 MIL/uL   Hemoglobin 10.8 (L) 12.0 - 15.0 g/dL   HCT 36.2 36.0 - 46.0 %   MCV 93.5 80.0 - 100.0 fL   MCH 27.9 26.0 - 34.0 pg   MCHC 29.8 (L) 30.0 - 36.0 g/dL   RDW 14.5 11.5 - 15.5 %   Platelets 129 (L) 150 - 400 K/uL    Comment: REPEATED TO VERIFY   nRBC 0.0 0.0 - 0.2 %    Comment: Performed at [facility], [place] [address] [place], [place] [address]  Basic metabolic panel     Status: Abnormal   Collection Time: 11/23/20  5:37 AM  Result Value Ref Range   Sodium 136 135 - 145 mmol/L   Potassium 3.8 3.5 - 5.1 mmol/L   Chloride 103 98 - 111 mmol/L   CO2 26 22 - 32 mmol/L   Glucose, Bld 210 (H) 70 - 99 mg/dL    Comment: Glucose reference range applies only to samples taken after fasting for at least 8 hours.   BUN 13 6 - 20 mg/dL   Creatinine, Ser 0.91 0.44 - 1.00  mg/dL   Calcium 9.2 8.9 - 10.3 mg/dL   GFR, Estimated >60 >60 mL/min    Comment: (NOTE) Calculated using the CKD-EPI Creatinine Equation (2021)    Anion gap 7 5 - 15    Comment: Performed at [facility], [place] [address], [place], [place] [address]  Troponin I (High Sensitivity)     Status: None   Collection Time: 11/23/20  3:50 PM  Result Value Ref Range   Troponin I (High Sensitivity) 6 <18 ng/L    Comment: (NOTE) Elevated high sensitivity troponin I (hsTnI) values and significant  changes across serial measurements may suggest ACS but many other  chronic and acute conditions are known to elevate hsTnI results.  Refer to the "Links" section for chest pain algorithms and additional  guidance. Performed at [facility], [place] [name], [place], [place] [address]   Glucose, capillary     Status: Abnormal   Collection Time: 11/23/20  5:39 PM  Result Value Ref Range   Glucose-Capillary 248 (H) 70 - 99 mg/dL    Comment: Glucose reference range applies only to samples taken after fasting for at least 8 hours.  Troponin I (High Sensitivity)     Status: None    Collection Time: 11/23/20  7:50 PM  Result Value Ref Range   Troponin I (High Sensitivity) 5 <18 ng/L    Comment: (NOTE) Elevated high sensitivity troponin I (hsTnI) values and significant  changes across serial measurements may suggest ACS but many other  chronic and acute conditions are known to elevate hsTnI results.  Refer to the "Links" section for chest pain algorithms and additional  guidance. Performed at [facility], [place] [address], [place], [place] [address]   Glucose, capillary     Status: Abnormal   Collection Time: 11/23/20  9:11 PM  Result Value Ref Range   Glucose-Capillary 272 (H) 70 - 99 mg/dL    Comment: Glucose reference range applies only to samples taken after fasting for at least 8 hours.  Hemoglobin A1c     Status: Abnormal   Collection Time: 11/24/20  4:18 AM  Result Value Ref Range   Hgb A1c MFr Bld 6.5 (H) 4.8 - 5.6 %    Comment: (NOTE) Pre diabetes:          5.7%-6.4%  Diabetes:              >6.4%  Glycemic control for   <7.0% adults with diabetes    Mean Plasma Glucose 139.85 mg/dL    Comment: Performed at [facility] [address] [place], [place] [address]  Glucose, capillary     Status: Abnormal   Collection Time: 11/24/20  7:36 AM  Result Value Ref Range   Glucose-Capillary 262 (H) 70 - 99 mg/dL    Comment: Glucose reference range applies only to samples taken after fasting for at least 8 hours.  Glucose, capillary     Status: Abnormal   Collection Time: 11/24/20 11:50 AM  Result Value Ref Range   Glucose-Capillary 248 (H) 70 - 99 mg/dL    Comment: Glucose reference range applies only to samples taken after fasting for at least 8 hours.  Procalcitonin - Baseline     Status: None   Collection Time: 11/24/20  2:45 PM  Result Value Ref Range   Procalcitonin 1.39 ng/mL    Comment:        Interpretation: PCT > 0.5 ng/mL and <= 2 ng/mL: Systemic infection (  sepsis) is possible, but other conditions are known to elevate PCT as  well. (NOTE)       Sepsis PCT Algorithm           Lower Respiratory Tract                                      Infection PCT Algorithm    ----------------------------     ----------------------------         PCT < 0.25 ng/mL                PCT < 0.10 ng/mL          Strongly encourage             Strongly discourage   discontinuation of antibiotics    initiation of antibiotics    ----------------------------     -----------------------------       PCT 0.25 - 0.50 ng/mL            PCT 0.10 - 0.25 ng/mL               OR       >80% decrease in PCT            Discourage initiation of                                            antibiotics      Encourage discontinuation           of antibiotics    ----------------------------     -----------------------------         PCT >= 0.50 ng/mL              PCT 0.26 - 0.50 ng/mL                AND       <80% decrease in PCT             Encourage initiation of                                             antibiotics       Encourage continuation           of antibiotics    ----------------------------     -----------------------------        PCT >= 0.50 ng/mL                  PCT > 0.50 ng/mL               AND         increase in PCT                  Strongly encourage                                      initiation of antibiotics    Strongly encourage escalation           of antibiotics                                     -----------------------------  PCT <= 0.25 ng/mL                                                 OR                                        > 80% decrease in PCT                                      Discontinue / Do not initiate                                             antibiotics  Performed at [facility], [place] [address], [place], [place] [address]   Glucose, capillary     Status: Abnormal   Collection Time: 11/24/20  3:59 PM  Result Value Ref Range   Glucose-Capillary 185  (H) 70 - 99 mg/dL    Comment: Glucose reference range applies only to samples taken after fasting for at least 8 hours.  Glucose, capillary     Status: Abnormal   Collection Time: 11/24/20  9:43 PM  Result Value Ref Range   Glucose-Capillary 239 (H) 70 - 99 mg/dL    Comment: Glucose reference range applies only to samples taken after fasting for at least 8 hours.  CBC with Differential/Platelet     Status: Abnormal   Collection Time: 11/25/20  4:25 AM  Result Value Ref Range   WBC 11.1 (H) 4.0 - 10.5 K/uL   RBC 3.76 (L) 3.87 - 5.11 MIL/uL   Hemoglobin 10.4 (L) 12.0 - 15.0 g/dL   HCT 34.1 (L) 36.0 - 46.0 %   MCV 90.7 80.0 - 100.0 fL   MCH 27.7 26.0 - 34.0 pg   MCHC 30.5 30.0 - 36.0 g/dL   RDW 14.4 11.5 - 15.5 %   Platelets 160 150 - 400 K/uL   nRBC 0.0 0.0 - 0.2 %   Neutrophils Relative % 91 %   Neutro Abs 10.1 (H) 1.7 - 7.7 K/uL   Lymphocytes Relative 4 %   Lymphs Abs 0.4 (L) 0.7 - 4.0 K/uL   Monocytes Relative 5 %   Monocytes Absolute 0.6 0.1 - 1.0 K/uL   Eosinophils Relative 0 %   Eosinophils Absolute 0.0 0.0 - 0.5 K/uL   Basophils Relative 0 %   Basophils Absolute 0.0 0.0 - 0.1 K/uL   Immature Granulocytes 0 %   Abs Immature Granulocytes 0.03 0.00 - 0.07 K/uL    Comment: Performed at [facility], [address], [place], [place] [address]  Basic metabolic panel     Status: Abnormal   Collection Time: 11/25/20  4:25 AM  Result Value Ref Range   Sodium 134 (L) 135 - 145 mmol/L   Potassium 3.8 3.5 - 5.1 mmol/L   Chloride 94 (L) 98 - 111 mmol/L   CO2 32 22 - 32 mmol/L   Glucose, Bld 205 (H) 70 - 99 mg/dL    Comment: Glucose reference range applies only to samples taken after fasting for  at least 8 hours.   BUN 25 (H) 6 - 20 mg/dL   Creatinine, Ser 0.84 0.44 - 1.00 mg/dL   Calcium 9.2 8.9 - 10.3 mg/dL   GFR, Estimated >60 >60 mL/min    Comment: (NOTE) Calculated using the CKD-EPI Creatinine Equation (2021)    Anion gap 8 5 - 15    Comment: Performed at [facility] [address] [place], [place] [address]  Magnesium     Status: None   Collection Time: 11/25/20  4:25 AM  Result Value Ref Range   Magnesium 2.0 1.7 - 2.4 mg/dL    Comment: Performed at [facility] [name], [place], [place] [address]  Glucose, capillary     Status: Abnormal   Collection Time: 11/25/20  7:47 AM  Result Value Ref Range   Glucose-Capillary 183 (H) 70 - 99 mg/dL    Comment: Glucose reference range applies only to samples taken after fasting for at least 8 hours.  Glucose, capillary     Status: Abnormal   Collection Time: 11/25/20 11:44 AM  Result Value Ref Range   Glucose-Capillary 227 (H) 70 - 99 mg/dL    Comment: Glucose reference range applies only to samples taken after fasting for at least 8 hours.  ECHOCARDIOGRAM COMPLETE     Status: None   Collection Time: 11/25/20  2:06 PM  Result Value Ref Range   Weight 5,664.94 oz   Height 66 in   BP 129/82 mmHg   S' Lateral 3.30 cm   Area-P 1/2 3.72 cm2  Glucose, capillary     Status: Abnormal   Collection Time: 11/25/20  4:18 PM  Result Value Ref Range   Glucose-Capillary 302 (H) 70 - 99 mg/dL    Comment: Glucose reference range applies only to samples taken after fasting for at least 8 hours.  Glucose, capillary     Status: Abnormal   Collection Time: 11/25/20 10:12 PM  Result Value Ref Range   Glucose-Capillary 267 (H) 70 - 99 mg/dL    Comment: Glucose reference range applies only to samples taken after fasting for at least 8 hours.  Glucose, capillary     Status: Abnormal   Collection Time: 11/26/20  7:27 AM  Result Value Ref Range   Glucose-Capillary 295 (H) 70 - 99 mg/dL    Comment: Glucose reference range applies only to samples taken after fasting for at least 8 hours.  Glucose, capillary     Status: Abnormal   Collection Time: 11/26/20 11:21 AM  Result Value Ref Range   Glucose-Capillary 383 (H) 70 - 99 mg/dL    Comment: Glucose reference range applies only to samples taken  after fasting for at least 8 hours.  Glucose, capillary     Status: Abnormal   Collection Time: 11/26/20  3:53 PM  Result Value Ref Range   Glucose-Capillary 169 (H) 70 - 99 mg/dL    Comment: Glucose reference range applies only to samples taken after fasting for at least 8 hours.  Glucose, capillary     Status: Abnormal   Collection Time: 11/26/20  9:40 PM  Result Value Ref Range   Glucose-Capillary 243 (H) 70 - 99 mg/dL    Comment: Glucose reference range applies only to samples taken after fasting for at least 8 hours.  Basic metabolic panel     Status: Abnormal   Collection Time: 11/27/20  1:50 AM  Result Value Ref Range   Sodium 131 (L) 135 - 145  mmol/L   Potassium 4.0 3.5 - 5.1 mmol/L   Chloride 90 (L) 98 - 111 mmol/L   CO2 32 22 - 32 mmol/L   Glucose, Bld 263 (H) 70 - 99 mg/dL    Comment: Glucose reference range applies only to samples taken after fasting for at least 8 hours.   BUN 33 (H) 6 - 20 mg/dL   Creatinine, Ser 0.96 0.44 - 1.00 mg/dL   Calcium 9.3 8.9 - 10.3 mg/dL   GFR, Estimated >60 >60 mL/min    Comment: (NOTE) Calculated using the CKD-EPI Creatinine Equation (2021)    Anion gap 9 5 - 15    Comment: Performed at [facility] [address], [place], [place] [address]  Magnesium     Status: None   Collection Time: 11/27/20  1:50 AM  Result Value Ref Range   Magnesium 2.2 1.7 - 2.4 mg/dL    Comment: Performed at [facility] [address], [place], [place] [address]  Glucose, capillary     Status: Abnormal   Collection Time: 11/27/20  7:27 AM  Result Value Ref Range   Glucose-Capillary 169 (H) 70 - 99 mg/dL    Comment: Glucose reference range applies only to samples taken after fasting for at least 8 hours.  Glucose, capillary     Status: Abnormal   Collection Time: 11/27/20 11:35 AM  Result Value Ref Range   Glucose-Capillary 165 (H) 70 - 99 mg/dL    Comment: Glucose reference range applies only to samples taken after fasting for at least 8  hours.  Glucose (CBG)     Status: Abnormal   Collection Time: 11/29/20  2:34 PM  Result Value Ref Range   POC Glucose 200 (A) 70 - 99 mg/dl  CBC with Differential/Platelet     Status: Abnormal   Collection Time: 11/29/20  2:45 PM  Result Value Ref Range   WBC 8.6 3.4 - 10.8 x10E3/uL   RBC 4.10 3.77 - 5.28 x10E6/uL   Hemoglobin 10.9 (L) 11.1 - 15.9 g/dL   Hematocrit 36.1 34.0 - 46.6 %   MCV 88 79 - 97 fL   MCH 26.6 26.6 - 33.0 pg   MCHC 30.2 (L) 31.5 - 35.7 g/dL   RDW 13.1 11.7 - 15.4 %   Platelets 165 150 - 450 x10E3/uL   Neutrophils 87 Not Estab. %   Lymphs 6 Not Estab. %   Monocytes 5 Not Estab. %   Eos 1 Not Estab. %   Basos 0 Not Estab. %   Neutrophils Absolute 7.6 (H) 1.4 - 7.0 x10E3/uL   Lymphocytes Absolute 0.5 (L) 0.7 - 3.1 x10E3/uL   Monocytes Absolute 0.4 0.1 - 0.9 x10E3/uL   EOS (ABSOLUTE) 0.0 0.0 - 0.4 x10E3/uL   Basophils Absolute 0.0 0.0 - 0.2 x10E3/uL   Immature Granulocytes 1 Not Estab. %   Immature Grans (Abs) 0.0 0.0 - 0.1 x10E3/uL  Comprehensive metabolic panel     Status: Abnormal   Collection Time: 11/29/20  2:45 PM  Result Value Ref Range   Glucose 208 (H) 65 - 99 mg/dL   BUN 12 6 - 24 mg/dL   Creatinine, Ser 0.77 0.57 - 1.00 mg/dL   eGFR 93 >59 mL/min/1.73   BUN/Creatinine Ratio 16 9 - 23   Sodium 138 134 - 144 mmol/L   Potassium 4.6 3.5 - 5.2 mmol/L   Chloride 96 96 - 106 mmol/L   CO2 29 20 - 29 mmol/L   Calcium 9.4  8.7 - 10.2 mg/dL   Total Protein 7.0 6.0 - 8.5 g/dL   Albumin 4.0 3.8 - 4.9 g/dL   Globulin, Total 3.0 1.5 - 4.5 g/dL   Albumin/Globulin Ratio 1.3 1.2 - 2.2   Bilirubin Total 0.7 0.0 - 1.2 mg/dL   Alkaline Phosphatase 77 44 - 121 IU/L   AST 10 0 - 40 IU/L   ALT 9 0 - 32 IU/L  Blood gas, arterial     Status: Abnormal   Collection Time: 12/22/20  2:13 PM  Result Value Ref Range   FIO2 21.00    pH, Arterial 7.388 7.350 - 7.450   pCO2 arterial 42.3 32.0 - 48.0 mmHg   pO2, Arterial 80.1 (L) 83.0 - 108.0 mmHg   Bicarbonate 24.9  20.0 - 28.0 mmol/L   Acid-Base Excess 0.5 0.0 - 2.0 mmol/L   O2 Saturation 95.5 %   Patient temperature 37.0    Collection site RIGHT RADIAL    Drawn by [id number]     Comment: COLLECTED BY RT CAROL    Sample type ARTERIAL DRAW    Allens test (pass/fail) PASS PASS    Comment: Performed at [facility], [place] [address] [place], [place] [address]  POCT URINALYSIS DIP (CLINITEK)     Status: Abnormal   Collection Time: 12/29/20  9:02 AM  Result Value Ref Range   Color, UA yellow yellow   Clarity, UA clear clear   Glucose, UA negative negative mg/dL   Bilirubin, UA negative negative   Ketones, POC UA negative negative mg/dL   Spec Grav, UA >=1.030 (A) 1.010 - 1.025   Blood, UA negative negative   pH, UA 5.5 5.0 - 8.0   POC PROTEIN,UA =30 (A) negative, trace   Urobilinogen, UA 0.2 0.2 or 1.0 E.U./dL   Nitrite, UA Negative Negative   Leukocytes, UA Negative Negative  Microalbumin / creatinine urine ratio     Status: None   Collection Time: 12/29/20 11:50 AM  Result Value Ref Range   Creatinine, Urine 249.8 Not Estab. mg/dL   Microalbumin, Urine 27.7 Not Estab. ug/mL   Microalb/Creat Ratio 11 0 - 29 mg/g creat    Comment:                        Normal:                0 -  29                        Moderately increased: 30 - 300                        Severely increased:       >300     Radiology No results found.  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  Particular given her size and recurrent ulceration, I would agree with continuing anticoagulation with Eliquis indefinitely.  Compression stockings and elevate her legs to reduce postphlebitic symptoms.   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  She has had this filter in for about 6 years but has been urged to consider having this removed.  I have told  her I am happy to attempt removal, but once they have been in that long there is no guarantee I  will be able to get the filter out.  She would like to have this scheduled after she gets back from her trip.  Risks and benefits are discussed.   Lower limb ulcer, calf, left, limited to breakdown of skin ([place]) Seeing the wound care center and they are doing wraps which sounds like a reasonable option at this point.  Lymphedema The patient is using lymphedema pump and this does feel good, but she continues to have significant swelling.  With her size and her previous history of DVT and severe lymphedema, that is not surprising entirely.  At this point, she will continue her conservative therapies.  If her right leg swelling worsens, she may have to consider an Unna boot on that side as well.  This seems to be working well for her left side.    Leotis Pain, MD  01/02/2021 12:18 PM    This note was created with Dragon medical transcription system.  Any errors from dictation are purely unintentional

## 2021-01-02 NOTE — Telephone Encounter (Signed)
Pt calling for med refills for   dapagliflozin propanediol (FARXIGA) 10 MG TABS tablet [id number]  PARoxetine (PAXIL) 10 MG tablet [id number]  spironolactone (ALDACTONE) 25 MG tablet [id number]  traMADol (ULTRAM) 50 MG tablet [id number]  gabapentin (NEURONTIN) 100 MG capsule [id number]  amLODipine (NORVASC) 5 MG tablet [id number]   Ph[facility]  [id number] - [place], [address] [place] [address]  Phone:  [phone]  Fax:  [phone]

## 2021-01-03 ENCOUNTER — Other Ambulatory Visit: Payer: Self-pay

## 2021-01-03 DIAGNOSIS — I5032 Chronic diastolic (congestive) heart failure: Secondary | ICD-10-CM

## 2021-01-03 NOTE — Telephone Encounter (Signed)
Please contact pt and make her aware that she has refills at the pharmacy. She will need to contact them to get refilled..   Dr. [name] would you be able to refill [facility]

## 2021-01-04 MED ORDER — SPIRONOLACTONE 25 MG PO TABS: 25.0000 mg | ORAL_TABLET | Freq: Every day | ORAL | 4 refills | Status: DC

## 2021-01-04 MED ORDER — FUROSEMIDE 80 MG PO TABS: 80.0000 mg | ORAL_TABLET | Freq: Every day | ORAL | 4 refills | Status: DC

## 2021-01-04 MED ORDER — CARVEDILOL 25 MG PO TABS: ORAL_TABLET | ORAL | 4 refills | Status: DC

## 2021-01-04 NOTE — Telephone Encounter (Addendum)
[name] from [facility] requesting the following scripts for spironolactone (ALDACTONE) 25 MG tablet, traMADol (ULTRAM) 50 MG tablet.  As per the pharmacist they will not refill dapagliflozin propanediol (FARXIGA) 10 MG TABS tablet due to insurance not being able to cover until 01/11/2021  Ready for pick up PARoxetine (PAXIL) 10 MG tablet , gabapentin (NEURONTIN) 100 MG capsule.  As per the amLODipine (NORVASC) 5 MG tablet patient should have enough pills, will not refill until 02/06/2021.  *Patient states she is going out of town and would like refills sent in as soon as possible.                amLODipine (NORVASC) 5 MG tablet  should have enough until 10/11

## 2021-01-04 NOTE — Telephone Encounter (Signed)
Will forward to provider  

## 2021-01-05 ENCOUNTER — Other Ambulatory Visit: Payer: Self-pay

## 2021-01-05 ENCOUNTER — Encounter (HOSPITAL_BASED_OUTPATIENT_CLINIC_OR_DEPARTMENT_OTHER): Payer: Medicaid Other | Admitting: Internal Medicine

## 2021-01-05 DIAGNOSIS — L97829 Non-pressure chronic ulcer of other part of left lower leg with unspecified severity: Secondary | ICD-10-CM | POA: Diagnosis not present

## 2021-01-05 NOTE — Telephone Encounter (Signed)
Pt called back to retort that she was not told that her tramadol was a short term medication. Pt is requesting a refill  [name] [id number] [place], [place] [address]  Phone: [phone] Fax: [phone]

## 2021-01-05 NOTE — Progress Notes (Signed)
LIANN, RACCA ([id number]) Visit Report for 01/05/2021 SuperBill Details Patient Name: Date of Service: [name], [name] 01/05/2021 Medical Record Number: [id number] Patient Account Number: [id number] Date of Birth/Sex: Treating RN: 1970/07/07 (50 y.o. [name], Tammi Klippel Primary Care Provider: [name] Other Clinician: Referring Provider: Treating Provider/Extender: Sammuel Bailiff in Treatment: 11 Diagnosis Coding ICD-10 Codes Code Description [phone] Non-pressure chronic ulcer of other part of left lower leg with unspecified severity I87.2 Venous insufficiency (chronic) (peripheral) I89.0 Lymphedema, not elsewhere classified Z86.718 Personal history of other venous thrombosis and embolism Facility Procedures CPT4 Code Description Modifier Quantity [id number] (Facility Use Only) [phone] - [facility] 1 Electronic Signature(s) Signed: 01/05/2021 12:45:30 PM By: [name] DO Signed: 01/05/2021 12:57:09 PM By: Deon Pilling RN, BSN Entered By: Deon Pilling on 01/05/2021 11:44:49

## 2021-01-05 NOTE — Progress Notes (Signed)
[name], [name] ([id number]) Visit Report for 01/05/2021 Arrival Information Details Patient Name: Date of Service: [name], [name] 01/05/2021 10:30 A M Medical Record Number: [id number] Patient Account Number: [id number] Date of Birth/Sex: Treating RN: 1970-08-23 (50 y.o. [name], [name] Primary Care [name]: [name] Other Clinician: Referring [name]: Treating [name]/Extender: [name] in Treatment: 11 Visit Information History Since Last Visit Added or deleted any medications: No Patient Arrived: Ambulatory Any new allergies or adverse reactions: No Arrival Time: 11:19 Had a fall or experienced change in No Accompanied By: self activities of daily living that may affect Transfer Assistance: None risk of falls: Patient Identification Verified: Yes Signs or symptoms of abuse/neglect since last visito No Secondary Verification Process Completed: Yes Hospitalized since last visit: No Patient Requires Transmission-Based Precautions: No Implantable device outside of the clinic excluding No Patient Has Alerts: Yes cellular tissue based products placed in the center Patient Alerts: ABI's: 11/21 L:1.17 since last visit: Has Dressing in Place as Prescribed: Yes Has Compression in Place as Prescribed: No Pain Present Now: No Notes compression half cut off per patient slid down. Electronic Signature(s) Signed: 01/05/2021 12:57:09 PM By: Deon Pilling RN, BSN Entered By: Deon Pilling on 01/05/2021 11:42:08 -------------------------------------------------------------------------------- Compression Therapy Details Patient Name: Date of Service: Megan Session D. 01/05/2021 10:30 A M Medical Record Number: [id number] Patient Account Number: [id number] Date of Birth/Sex: Treating RN: December 14, 1970 (50 y.o. [name] Primary Care [name]: [name] Other Clinician: Referring [name]: Treating [name]/Extender: [name] in Treatment: 11 Compression Therapy Performed for Wound Assessment: Wound #4 Left,Medial Lower Leg Performed By: Clinician Deon Pilling, RN Compression Type: Four Layer Electronic Signature(s) Signed: 01/05/2021 12:57:09 PM By: Deon Pilling RN, BSN Entered By: Deon Pilling on 01/05/2021 11:44:01 -------------------------------------------------------------------------------- Encounter Discharge Information Details Patient Name: Date of Service: Megan Session D. 01/05/2021 10:30 A M Medical Record Number: [id number] Patient Account Number: [id number] Date of Birth/Sex: Treating RN: [birth year] (50 y.o. [name] Primary Care Sabree Nuon: [name] Other Clinician: Referring Baby Stairs: Treating [name]/Extender: [name] in Treatment: 11 Encounter Discharge Information Items Discharge Condition: Stable Ambulatory Status: Ambulatory Discharge Destination: Home Transportation: Private Auto Accompanied By: self Schedule Follow-up Appointment: Yes Clinical Summary of Care: Electronic Signature(s) Signed: 01/05/2021 12:57:09 PM By: Deon Pilling RN, BSN Entered By: Deon Pilling on 01/05/2021 11:44:35 -------------------------------------------------------------------------------- Patient/Caregiver Education Details Patient Name: Date of Service: Megan Session D. 9/9/2022andnbsp10:30 [place] Record Number: [id number] Patient Account Number: [id number] Date of Birth/Gender: Treating RN: [birth year] (50 y.o. [name] Primary Care Physician: [name] Other Clinician: Referring Physician: Treating Physician/Extender: [name] in Treatment: 11 Education Assessment Education Provided To: Patient Education Topics Provided Wound/Skin Impairment: Handouts: Skin Care Do's and Dont's Methods: Explain/Verbal Responses: Reinforcements needed Electronic  Signature(s) Signed: 01/05/2021 12:57:09 PM By: Deon Pilling RN, BSN Entered By: Deon Pilling on 01/05/2021 11:44:26 -------------------------------------------------------------------------------- Wound Assessment Details Patient Name: Date of Service: Megan Session D. 01/05/2021 10:30 A M Medical Record Number: [id number] Patient Account Number: [id number] Date of Birth/Sex: Treating RN: 08-Nov-1970 (50 y.o. [name] Primary Care Amia Rynders: [name] Other Clinician: Referring [name]: Treating [name]/Extender: [name] in Treatment: 11 Wound Status Wound Number: 4 Primary Venous Leg Ulcer Etiology: Wound Location: Left, Medial Lower Leg Wound Open Wounding Event: Gradually Appeared Status: Date Acquired: 09/26/2020 Comorbid Sleep Apnea, Congestive Heart Failure, Coronary Artery Weeks Of Treatment:  11 History: Disease, Hypertension Clustered Wound: No Wound Measurements Length: (cm) 0.5 Width: (cm) 2.7 Depth: (cm) 0.1 Area: (cm) 1.06 Volume: (cm) 0.106 % Reduction in Area: 93.5% % Reduction in Volume: 93.5% Epithelialization: Large (67-100%) Tunneling: No Undermining: No Wound Description Classification: Full Thickness Without Exposed Support Structures Wound Margin: Distinct, outline attached Exudate Amount: Medium Exudate Type: Serosanguineous Exudate Color: red, brown Foul Odor After Cleansing: No Slough/Fibrino No Wound Bed Granulation Amount: Large (67-100%) Exposed Structure Granulation Quality: Red Fascia Exposed: No Necrotic Amount: None Present (0%) Fat Layer (Subcutaneous Tissue) Exposed: Yes Tendon Exposed: No Muscle Exposed: No Joint Exposed: No Bone Exposed: No Treatment Notes Wound #4 (Lower Leg) Wound Laterality: Left, Medial Cleanser Soap and Water Discharge Instruction: May shower and wash wound with dial antibacterial soap and water prior to dressing change. Peri-Wound Care Sween  Lotion (Moisturizing lotion) Discharge Instruction: Apply moisturizing lotion as directed Topical Primary Dressing Hydrofera Blue Ready Foam, 4x5 in Discharge Instruction: Apply to wound bed as instructed Secondary Dressing Woven Gauze Sponge, Non-Sterile 4x4 in Discharge Instruction: Apply over primary dressing as directed. ABD Pad, 8x10 Discharge Instruction: Apply over primary dressing as directed. Secured With Compression Wrap Unnaboot w/Calamine, 4x10 (in/yd) Discharge Instruction: unna at top of wrap to help secure CoFlex TLC XL 2-layer Compression System 4x7 (in/yd) Discharge Instruction: Apply CoFlex 2-layer compression as directed. (alt for 4 layer) Compression Stockings Add-Ons Electronic Signature(s) Signed: 01/05/2021 12:57:09 PM By: Deon Pilling RN, BSN Entered By: Deon Pilling on 01/05/2021 11:42:50 -------------------------------------------------------------------------------- Vitals Details Patient Name: Date of Service: [name], Notnamed D. 01/05/2021 10:30 A M Medical Record Number: [id number] Patient Account Number: [id number] Date of Birth/Sex: Treating RN: 08/11/1970 (50 y.o. [name], [name] Primary Care Krystale Rinkenberger: [name] Other Clinician: Referring Ritaj Dullea: Treating Gwenith Tschida/Extender: [name] in Treatment: 11 Vital Signs Time Taken: 11:19 Temperature (F): 97.5 Height (in): 67 Pulse (bpm): 82 Weight (lbs): 346 Respiratory Rate (breaths/min): 20 Body Mass Index (BMI): 54.2 Blood Pressure (mmHg): 132/71 Reference Range: 80 - 120 mg / dl Electronic Signature(s) Signed: 01/05/2021 12:57:09 PM By: Deon Pilling RN, BSN Entered By: Deon Pilling on 01/05/2021 11:42:27

## 2021-01-08 ENCOUNTER — Encounter (INDEPENDENT_AMBULATORY_CARE_PROVIDER_SITE_OTHER): Payer: Self-pay

## 2021-01-08 ENCOUNTER — Ambulatory Visit (INDEPENDENT_AMBULATORY_CARE_PROVIDER_SITE_OTHER): Payer: Medicaid Other | Admitting: Student

## 2021-01-08 ENCOUNTER — Telehealth (INDEPENDENT_AMBULATORY_CARE_PROVIDER_SITE_OTHER): Payer: Self-pay

## 2021-01-08 ENCOUNTER — Other Ambulatory Visit: Payer: Self-pay

## 2021-01-08 DIAGNOSIS — J45998 Other asthma: Secondary | ICD-10-CM | POA: Diagnosis not present

## 2021-01-08 LAB — PULMONARY FUNCTION TEST
FEF 25-75 Post: 0.73 L/sec
FEF 25-75 Pre: 0.68 L/sec
FEF2575-%Change-Post: 6 %
FEF2575-%Pred-Post: 31 %
FEF2575-%Pred-Pre: 29 %
FEV1-%Change-Post: 6 %
FEV1-%Pred-Post: 52 %
FEV1-%Pred-Pre: 49 %
FEV1-Post: 1.16 L
FEV1-Pre: 1.09 L
FEV1FVC-%Change-Post: 9 %
FEV1FVC-%Pred-Pre: 80 %
FEV6-%Change-Post: -2 %
FEV6-%Pred-Post: 60 %
FEV6-%Pred-Pre: 62 %
FEV6-Post: 1.62 L
FEV6-Pre: 1.66 L
FEV6FVC-%Pred-Post: 103 %
FEV6FVC-%Pred-Pre: 103 %
FVC-%Change-Post: -2 %
FVC-%Pred-Post: 59 %
FVC-%Pred-Pre: 60 %
FVC-Post: 1.62 L
FVC-Pre: 1.66 L
Post FEV1/FVC ratio: 72 %
Post FEV6/FVC ratio: 100 %
Pre FEV1/FVC ratio: 66 %
Pre FEV6/FVC Ratio: 100 %

## 2021-01-08 NOTE — Progress Notes (Signed)
Spirometry pre and post done today. 

## 2021-01-08 NOTE — Telephone Encounter (Signed)
I called the pt  and made her aware of her procedure date and time as well as her Covid test and Pre Op. The pt voiced understanding.

## 2021-01-11 ENCOUNTER — Encounter (HOSPITAL_BASED_OUTPATIENT_CLINIC_OR_DEPARTMENT_OTHER): Payer: Medicaid Other | Admitting: Internal Medicine

## 2021-01-11 ENCOUNTER — Other Ambulatory Visit: Payer: Self-pay

## 2021-01-11 DIAGNOSIS — L97829 Non-pressure chronic ulcer of other part of left lower leg with unspecified severity: Secondary | ICD-10-CM | POA: Diagnosis not present

## 2021-01-11 NOTE — Progress Notes (Signed)
[name] ([id number]) Visit Report for 01/11/2021 Chief Complaint Document Details Patient Name: Date of Service: [name], [name] 01/11/2021 2:00 PM Medical Record Number: [id number] Patient Account Number: [id number] Date of Birth/Sex: Treating RN: November 23, 1970 (50 y.o. [name], [name] Primary Care Provider: [name] Other Clinician: Referring Provider: Treating Provider/Extender: Sammuel Bailiff in Treatment: 11 Information Obtained from: Patient Chief Complaint Left lower extremity wound Electronic Signature(s) Signed: 01/11/2021 4:11:56 PM By: [name] DO Entered By: [name] on 01/11/2021 16:05:08 -------------------------------------------------------------------------------- Debridement Details Patient Name: Date of Service: [name], Earma D. 01/11/2021 2:00 PM Medical Record Number: [id number] Patient Account Number: [id number] Date of Birth/Sex: Treating RN: Aug 30, 1970 (50 y.o. [name], [name] Primary Care Provider: [name] Other Clinician: Referring Provider: Treating Provider/Extender: Sammuel Bailiff in Treatment: 11 Debridement Performed for Assessment: Wound #4 Left,Medial Lower Leg Performed By: Physician [name], DO Debridement Type: Debridement Severity of Tissue Pre Debridement: Fat layer exposed Level of Consciousness (Pre-procedure): Awake and Alert Pre-procedure Verification/Time Out Yes - 15:06 Taken: Start Time: 15:06 Pain Control: Lidocaine T Area Debrided (L x W): otal 1 (cm) x 2.5 (cm) = 2.5 (cm) Tissue and other material debrided: Viable, Non-Viable, Slough, Subcutaneous, Skin: Dermis , Skin: Epidermis, Slough Level: Skin/Subcutaneous Tissue Debridement Description: Excisional Instrument: Curette Bleeding: Minimum Hemostasis Achieved: Pressure End Time: 15:06 Procedural Pain: 0 Post Procedural Pain: 0 Response to Treatment:  Procedure was tolerated well Level of Consciousness (Post- Awake and Alert procedure): Post Debridement Measurements of Total Wound Length: (cm) 1 Width: (cm) 2.5 Depth: (cm) 0.1 Volume: (cm) 0.196 Character of Wound/Ulcer Post Debridement: Improved Severity of Tissue Post Debridement: Fat layer exposed Post Procedure Diagnosis Same as Pre-procedure Electronic Signature(s) Signed: 01/11/2021 4:11:56 PM By: [name] DO Signed: 01/11/2021 4:34:42 PM By: Rhae Hammock RN Entered By: Rhae Hammock on 01/11/2021 15:07:34 -------------------------------------------------------------------------------- HPI Details Patient Name: Date of Service: [name], [name] 01/11/2021 2:00 PM Medical Record Number: [id number] Patient Account Number: [id number] Date of Birth/Sex: Treating RN: 05/04/1970 (50 y.o. [name] Primary Care Provider: [name] Other Clinician: Referring Provider: Treating Provider/Extender: Sammuel Bailiff in Treatment: 11 History of Present Illness HPI Description: Admission 6/24 [name] with a past medical history of chronic venous insufficiency, lymphedema, DVT on anticoagulation and diastolic heart failure that presents to the clinic for left lower extremity wound. She was last seen 4 months ago in our clinic for the same issue. The reoccurring wound started at the end of May and has been going on for 1 month. She has been using an ointment and I am unclear what this is. She tries to keep her leg elevated with her compression stocking. She also reports she has lymphedema pumps and has been using them as well. She reports mild pain to the area. She denies signs of infection including increased warmth, erythema or purulent drainage. 7/1; patient presents for 1 week follow-up. She has tolerated the wrap well. Unfortunately she did have trouble with the wrap Sliding down her leg 2  days ago. She has no issues or complaints today. She denies signs of infection. 7/15; patient presents for follow-up. She has tolerated the wrap well. She has no issues or complaints today. She denies signs of infection. 7/25; patient presents for 1 week follow-up. She has no issues or complaints today. She denies signs of infection. 8/4; patient presents for follow-up. She has tolerated the compression wrap  well to her left lower extremity. She states she was in the hospital last week due to fluid overload. She subsequently developed blisters to her bottom from being in the hospital bed for prolonged periods of time. These have since ruptured. She now has 2 areas of skin breakdown. She has been keeping gauze on them. She denies signs of infection. 8/18; patient presents for 2-week follow-up. She reports improvement to her buttocks wounds. She is able to tolerate the wrap well to her left lower extremity with no issues. She denies signs of infection. 9/1; patient presents for 2-week follow-up. She has no issues or complaints today. she denies signs of infection. 9/15; patient presents for 2-week follow-up. She has no issues or complaints today. She has tolerated the wrap well. She denies signs of infection. Electronic Signature(s) Signed: 01/11/2021 4:11:56 PM By: [name] DO Entered By: [name] on 01/11/2021 16:05:31 -------------------------------------------------------------------------------- Physical Exam Details Patient Name: Date of Service: [name], [name] 01/11/2021 2:00 PM Medical Record Number: [id number] Patient Account Number: [id number] Date of Birth/Sex: Treating RN: 1970/11/01 (50 y.o. [name], [name] Primary Care Provider: [name] Other Clinician: Referring Provider: Treating Provider/Extender: Sammuel Bailiff in Treatment: 11 Constitutional respirations regular, non-labored and within target range for  patient.. Cardiovascular 2+ dorsalis pedis/posterior tibialis pulses. Psychiatric pleasant and cooperative. Notes Left lower extremity: On the medial aspect there are 2 small open wounds with granulation tissue present and biofilm present. No signs of infection. Good edema control. Electronic Signature(s) Signed: 01/11/2021 4:11:56 PM By: [name] DO Entered By: [name] on 01/11/2021 16:06:22 -------------------------------------------------------------------------------- Physician Orders Details Patient Name: Date of Service: [name], Deneen D. 01/11/2021 2:00 PM Medical Record Number: [id number] Patient Account Number: [id number] Date of Birth/Sex: Treating RN: 1971-03-10 (50 y.o. [name], [name] Primary Care Provider: [name] Other Clinician: Referring Provider: Treating Provider/Extender: Sammuel Bailiff in Treatment: 11 Verbal / Phone Orders: No Diagnosis Coding ICD-10 Coding Code Description [phone] Non-pressure chronic ulcer of other part of left lower leg with unspecified severity I87.2 Venous insufficiency (chronic) (peripheral) I89.0 Lymphedema, not elsewhere classified Z86.718 Personal history of other venous thrombosis and embolism Follow-up Appointments ppointment in 2 weeks. - Dr. Heber  on Tuesday 09/27 Return A Nurse Visit: - next week on Friday 09/23 Bathing/ Shower/ Hygiene May shower with protection but do not get wound dressing(s) wet. - use cast protector to keep wrap dry in the shower Edema Control - Lymphedema / SCD / Other Bilateral Lower Extremities Lymphedema Pumps. Use Lymphedema pumps on leg(s) 2-3 times a day for 45-60 minutes. If wearing any wraps or hose, do not remove them. Continue exercising as instructed. Elevate legs to the level of the heart or above for 30 minutes daily and/or when sitting, a frequency of: Avoid standing for long periods of time. Patient to wear own compression  stockings every day. - right leg daily Exercise regularly Off-Loading Turn and reposition every 2 hours - Ensure not to apply pressure to these areas. Wound Treatment Wound #4 - Lower Leg Wound Laterality: Left, Medial Cleanser: Soap and Water 1 x Per Week/15 Days Discharge Instructions: May shower and wash wound with dial antibacterial soap and water prior to dressing change. Peri-Wound Care: Sween Lotion (Moisturizing lotion) 1 x Per Week/15 Days Discharge Instructions: Apply moisturizing lotion as directed Prim Dressing: PolyMem Silver Non-Adhesive Dressing, 4.25x4.25 in 1 x Per Week/15 Days ary Discharge Instructions: Apply to wound bed as instructed Secondary Dressing: Woven Gauze Sponge,  Non-Sterile 4x4 in 1 x Per Week/15 Days Discharge Instructions: Apply over primary dressing as directed. Secondary Dressing: ABD Pad, 8x10 1 x Per Week/15 Days Discharge Instructions: Apply over primary dressing as directed. Compression Wrap: Unnaboot w/Calamine, 4x10 (in/yd) 1 x Per Week/15 Days Discharge Instructions: unna at top of wrap to help secure Compression Wrap: CoFlex TLC XL 2-layer Compression System 4x7 (in/yd) 1 x Per Week/15 Days Discharge Instructions: Apply CoFlex 2-layer compression as directed. (alt for 4 layer) Electronic Signature(s) Signed: 01/11/2021 4:11:56 PM By: [name] DO Entered By: [name] on 01/11/2021 16:06:50 -------------------------------------------------------------------------------- Problem List Details Patient Name: Date of Service: [name], Korey D. 01/11/2021 2:00 PM Medical Record Number: [id number] Patient Account Number: [id number] Date of Birth/Sex: Treating RN: [birth year] (50 y.o. [name], [name] Primary Care Provider: [name] Other Clinician: Referring Provider: Treating Provider/Extender: Sammuel Bailiff in Treatment: 11 Active Problems ICD-10 Encounter Code Description Active Date  MDM Diagnosis L97.829 Non-pressure chronic ulcer of other part of left lower leg with unspecified 10/20/2020 No Yes severity I87.2 Venous insufficiency (chronic) (peripheral) 10/20/2020 No Yes I89.0 Lymphedema, not elsewhere classified 10/20/2020 No Yes Z86.718 Personal history of other venous thrombosis and embolism 10/20/2020 No Yes Inactive Problems Resolved Problems Electronic Signature(s) Signed: 01/11/2021 4:11:56 PM By: [name] DO Entered By: [name] on 01/11/2021 16:04:48 -------------------------------------------------------------------------------- Progress Note Details Patient Name: Date of Service: [name], Roseline D. 01/11/2021 2:00 PM Medical Record Number: [id number] Patient Account Number: [id number] Date of Birth/Sex: Treating RN: 03-03-1971 (50 y.o. [name], [name] Primary Care Provider: [name] Other Clinician: Referring Provider: Treating Provider/Extender: Sammuel Bailiff in Treatment: 11 Subjective Chief Complaint Information obtained from Patient Left lower extremity wound History of Present Illness (HPI) Admission 6/24 Ms. Infinity Wiederholt is a 50 year old female with a past medical history of chronic venous insufficiency, lymphedema, DVT on anticoagulation and diastolic heart failure that presents to the clinic for left lower extremity wound. She was last seen 4 months ago in our clinic for the same issue. The reoccurring wound started at the end of May and has been going on for 1 month. She has been using an ointment and I am unclear what this is. She tries to keep her leg elevated with her compression stocking. She also reports she has lymphedema pumps and has been using them as well. She reports mild pain to the area. She denies signs of infection including increased warmth, erythema or purulent drainage. 7/1; patient presents for 1 week follow-up. She has tolerated the wrap well. Unfortunately she did  have trouble with the wrap Sliding down her leg 2 days ago. She has no issues or complaints today. She denies signs of infection. 7/15; patient presents for follow-up. She has tolerated the wrap well. She has no issues or complaints today. She denies signs of infection. 7/25; patient presents for 1 week follow-up. She has no issues or complaints today. She denies signs of infection. 8/4; patient presents for follow-up. She has tolerated the compression wrap well to her left lower extremity. She states she was in the hospital last week due to fluid overload. She subsequently developed blisters to her bottom from being in the hospital bed for prolonged periods of time. These have since ruptured. She now has 2 areas of skin breakdown. She has been keeping gauze on them. She denies signs of infection. 8/18; patient presents for 2-week follow-up. She reports improvement to her buttocks wounds. She is able to tolerate the  wrap well to her left lower extremity with no issues. She denies signs of infection. 9/1; patient presents for 2-week follow-up. She has no issues or complaints today. she denies signs of infection. 9/15; patient presents for 2-week follow-up. She has no issues or complaints today. She has tolerated the wrap well. She denies signs of infection. Patient History Information obtained from Patient. Family History Diabetes - Mother,Maternal Grandparents, Heart Disease - Paternal Grandparents, Hypertension - Mother,Maternal Grandparents, Lung Disease - Father,Paternal Grandparents, No family history of Cancer, Hereditary Spherocytosis, Kidney Disease, Seizures, Stroke, Thyroid Problems, Tuberculosis. Social History Former smoker, Marital Status - Single, Alcohol Use - Never, Drug Use - No History, Caffeine Use - Daily. Medical History Eyes Denies history of Cataracts, Glaucoma, Optic Neuritis Ear/Nose/Mouth/Throat Denies history of Chronic sinus problems/congestion, Middle ear  problems Hematologic/Lymphatic Denies history of Anemia, Hemophilia, Human Immunodeficiency Virus, Lymphedema, Sickle Cell Disease Respiratory Patient has history of Sleep Apnea Denies history of Aspiration, Asthma, Chronic Obstructive Pulmonary Disease (COPD), Pneumothorax, Tuberculosis Cardiovascular Patient has history of Congestive Heart Failure, Coronary Artery Disease, Hypertension Denies history of Angina, Arrhythmia, Deep Vein Thrombosis, Hypotension, Myocardial Infarction, Peripheral Arterial Disease, Peripheral Venous Disease, Phlebitis, Vasculitis Gastrointestinal Denies history of Cirrhosis , Colitis, Crohnoos, Hepatitis A, Hepatitis B, Hepatitis C Endocrine Denies history of Type I Diabetes, Type II Diabetes Genitourinary Denies history of End Stage Renal Disease Immunological Denies history of Lupus Erythematosus, Raynaudoos, Scleroderma Integumentary (Skin) Denies history of History of Burn Musculoskeletal Denies history of Gout, Rheumatoid Arthritis, Osteoarthritis, Osteomyelitis Neurologic Denies history of Dementia, Neuropathy, Quadriplegia, Paraplegia, Seizure Disorder Oncologic Denies history of Received Chemotherapy, Received Radiation Psychiatric Denies history of Anorexia/bulimia, Confinement Anxiety Objective Constitutional respirations regular, non-labored and within target range for patient.. Vitals Time Taken: 2:46 PM, Height: 67 in, Weight: 346 lbs, BMI: 54.2, Temperature: 97.8 F, Pulse: 84 bpm, Respiratory Rate: 20 breaths/min, Blood Pressure: 135/83 mmHg. Cardiovascular 2+ dorsalis pedis/posterior tibialis pulses. Psychiatric pleasant and cooperative. General Notes: Left lower extremity: On the medial aspect there are 2 small open wounds with granulation tissue present and biofilm present. No signs of infection. Good edema control. Integumentary (Hair, Skin) Wound #4 status is Open. Original cause of wound was Gradually Appeared. The date  acquired was: 09/26/2020. The wound has been in treatment 11 weeks. The wound is located on the Left,Medial Lower Leg. The wound measures 1cm length x 2.5cm width x 0.1cm depth; 1.963cm^2 area and 0.196cm^3 volume. There is Fat Layer (Subcutaneous Tissue) exposed. There is a medium amount of serosanguineous drainage noted. The wound margin is distinct with the outline attached to the wound base. There is large (67-100%) red granulation within the wound bed. There is no necrotic tissue within the wound bed. Assessment Active Problems ICD-10 Non-pressure chronic ulcer of other part of left lower leg with unspecified severity Venous insufficiency (chronic) (peripheral) Lymphedema, not elsewhere classified Personal history of other venous thrombosis and embolism Patient's wounds are stable. I debrided nonviable tissue. There is no signs of infection on exam. Since wounds appear stalled I will switch the dressing from Hydrofera Blue to PolyMem silver. I recommended continuing compression wrap. Patient will follow-up next week for nurse visit and then in 2 weeks with me. Procedures Wound #4 Pre-procedure diagnosis of Wound #4 is a Venous Leg Ulcer located on the Left,Medial Lower Leg .Severity of Tissue Pre Debridement is: Fat layer exposed. There was a Excisional Skin/Subcutaneous Tissue Debridement with a total area of 2.5 sq cm performed by [name], DO. With the following instrument(s): Curette  to remove Viable and Non-Viable tissue/material. Material removed includes Subcutaneous Tissue, Slough, Skin: Dermis, and Skin: Epidermis after achieving pain control using Lidocaine. No specimens were taken. A time out was conducted at 15:06, prior to the start of the procedure. A Minimum amount of bleeding was controlled with Pressure. The procedure was tolerated well with a pain level of 0 throughout and a pain level of 0 following the procedure. Post Debridement Measurements: 1cm length x 2.5cm  width x 0.1cm depth; 0.196cm^3 volume. Character of Wound/Ulcer Post Debridement is improved. Severity of Tissue Post Debridement is: Fat layer exposed. Post procedure Diagnosis Wound #4: Same as Pre-Procedure Plan Follow-up Appointments: Return Appointment in 2 weeks. - Dr. Heber [place] on Tuesday 09/27 Nurse Visit: - next week on Friday 09/23 Bathing/ Shower/ Hygiene: May shower with protection but do not get wound dressing(s) wet. - use cast protector to keep wrap dry in the shower Edema Control - Lymphedema / SCD / Other: Lymphedema Pumps. Use Lymphedema pumps on leg(s) 2-3 times a day for 45-60 minutes. If wearing any wraps or hose, do not remove them. Continue exercising as instructed. Elevate legs to the level of the heart or above for 30 minutes daily and/or when sitting, a frequency of: Avoid standing for long periods of time. Patient to wear own compression stockings every day. - right leg daily Exercise regularly Off-Loading: Turn and reposition every 2 hours - Ensure not to apply pressure to these areas. WOUND #4: - Lower Leg Wound Laterality: Left, Medial Cleanser: Soap and Water 1 x Per Week/15 Days Discharge Instructions: May shower and wash wound with dial antibacterial soap and water prior to dressing change. Peri-Wound Care: Sween Lotion (Moisturizing lotion) 1 x Per Week/15 Days Discharge Instructions: Apply moisturizing lotion as directed Prim Dressing: PolyMem Silver Non-Adhesive Dressing, 4.25x4.25 in 1 x Per Week/15 Days ary Discharge Instructions: Apply to wound bed as instructed Secondary Dressing: Woven Gauze Sponge, Non-Sterile 4x4 in 1 x Per Week/15 Days Discharge Instructions: Apply over primary dressing as directed. Secondary Dressing: ABD Pad, 8x10 1 x Per Week/15 Days Discharge Instructions: Apply over primary dressing as directed. Com pression Wrap: Unnaboot w/Calamine, 4x10 (in/yd) 1 x Per Week/15 Days Discharge Instructions: unna at top of wrap to help  secure Com pression Wrap: CoFlex TLC XL 2-layer Compression System 4x7 (in/yd) 1 x Per Week/15 Days Discharge Instructions: Apply CoFlex 2-layer compression as directed. (alt for 4 layer) 1. In office sharp debridement 2. PolyMem silver under 2 layer Coflex 3. Follow-up in 1 week for nurse visit in 2 weeks with me Electronic Signature(s) Signed: 01/11/2021 4:11:56 PM By: [name] DO Entered By: [name] on 01/11/2021 16:10:00 -------------------------------------------------------------------------------- HxROS Details Patient Name: Date of Service: [name], Kerrigan D. 01/11/2021 2:00 PM Medical Record Number: [id number] Patient Account Number: [id number] Date of Birth/Sex: Treating RN: February 01, 1971 (50 y.o. [name], [name] Primary Care Provider: [name] Other Clinician: Referring Provider: Treating Provider/Extender: Sammuel Bailiff in Treatment: 11 Information Obtained From Patient Eyes Medical History: Negative for: Cataracts; Glaucoma; Optic Neuritis Ear/Nose/Mouth/Throat Medical History: Negative for: Chronic sinus problems/congestion; Middle ear problems Hematologic/Lymphatic Medical History: Negative for: Anemia; Hemophilia; Human Immunodeficiency Virus; Lymphedema; Sickle Cell Disease Respiratory Medical History: Positive for: Sleep Apnea Negative for: Aspiration; Asthma; Chronic Obstructive Pulmonary Disease (COPD); Pneumothorax; Tuberculosis Cardiovascular Medical History: Positive for: Congestive Heart Failure; Coronary Artery Disease; Hypertension Negative for: Angina; Arrhythmia; Deep Vein Thrombosis; Hypotension; Myocardial Infarction; Peripheral Arterial Disease; Peripheral Venous Disease; Phlebitis; Vasculitis Gastrointestinal Medical History: Negative  for: Cirrhosis ; Colitis; Crohns; Hepatitis A; Hepatitis B; Hepatitis C Endocrine Medical History: Negative for: Type I Diabetes; Type II  Diabetes Genitourinary Medical History: Negative for: End Stage Renal Disease Immunological Medical History: Negative for: Lupus Erythematosus; Raynauds; Scleroderma Integumentary (Skin) Medical History: Negative for: History of Burn Musculoskeletal Medical History: Negative for: Gout; Rheumatoid Arthritis; Osteoarthritis; Osteomyelitis Neurologic Medical History: Negative for: Dementia; Neuropathy; Quadriplegia; Paraplegia; Seizure Disorder Oncologic Medical History: Negative for: Received Chemotherapy; Received Radiation Psychiatric Medical History: Negative for: Anorexia/bulimia; Confinement Anxiety Immunizations Pneumococcal Vaccine: Received Pneumococcal Vaccination: No Implantable Devices None Family and Social History Cancer: No; Diabetes: Yes - Mother,Maternal Grandparents; Heart Disease: Yes - Paternal Grandparents; Hereditary Spherocytosis: No; Hypertension: Yes - Mother,Maternal Grandparents; Kidney Disease: No; Lung Disease: Yes - Father,Paternal Grandparents; Seizures: No; Stroke: No; Thyroid Problems: No; Tuberculosis: No; Former smoker; Marital Status - Single; Alcohol Use: Never; Drug Use: No History; Caffeine Use: Daily; Financial Concerns: No; Food, Clothing or Shelter Needs: No; Support System Lacking: No; Transportation Concerns: No Electronic Signature(s) Signed: 01/11/2021 4:11:56 PM By: [name] DO Signed: 01/11/2021 4:34:42 PM By: Rhae Hammock RN Entered By: [name] on 01/11/2021 16:05:46 -------------------------------------------------------------------------------- SuperBill Details Patient Name: Date of Service: [name], Amada D. 01/11/2021 Medical Record Number: [id number] Patient Account Number: [id number] Date of Birth/Sex: Treating RN: [birth year] (50 y.o. [name], [name] Primary Care Provider: [name] Other Clinician: Referring Provider: Treating Provider/Extender: Sammuel Bailiff in Treatment: 11 Diagnosis Coding ICD-10 Codes Code Description [phone] Non-pressure chronic ulcer of other part of left lower leg with unspecified severity I87.2 Venous insufficiency (chronic) (peripheral) I89.0 Lymphedema, not elsewhere classified Z86.718 Personal history of other venous thrombosis and embolism Facility Procedures CPT4 Code: [id number] Description: [id number] - DEB SUBQ TISSUE 20 SQ CM/< ICD-10 Diagnosis Description L97.829 Non-pressure chronic ulcer of other part of left lower leg with unspecified sev Modifier: erity Quantity: 1 Physician Procedures : CPT4 Code Description Modifier [id number] - WC PHYS SUBQ TISS 20 SQ CM ICD-10 Diagnosis Description L97.829 Non-pressure chronic ulcer of other part of left lower leg with unspecified severity Quantity: 1 Electronic Signature(s) Signed: 01/11/2021 4:11:56 PM By: [name] DO Entered By: [name] on 01/11/2021 16:10:14

## 2021-01-12 ENCOUNTER — Telehealth: Payer: Self-pay | Admitting: Internal Medicine

## 2021-01-12 NOTE — Telephone Encounter (Signed)
Will forward to provider  

## 2021-01-12 NOTE — Telephone Encounter (Signed)
Pt last seen dr [name] on 12-29-2020 and would like [address] [name] to return her call concerning her procedure on 02-06-2021 to have IVC filter removed. Please advise

## 2021-01-15 NOTE — Telephone Encounter (Signed)
PC placed to pt this evening.  Her Mailbox is full and I was unable to leave a message.  If pt calls back, please inquire what question she has about her IVC filter.

## 2021-01-15 NOTE — Progress Notes (Signed)
TIFFENY, [name] ([id number]) Visit Report for 01/11/2021 Arrival Information Details Patient Name: Date of Service: [name], [name] 01/11/2021 2:00 PM Medical Record Number: [id number] Patient Account Number: [id number] Date of Birth/Sex: Treating RN: 12-29-1970 (50 y.o. [name], [name] Primary Care Maceo Hernan: [name] Other Clinician: Referring Loletha Bertini: Treating Ingris Pasquarella/Extender: Sammuel Bailiff in Treatment: 11 Visit Information History Since Last Visit Added or deleted any medications: No Patient Arrived: Ambulatory Any new allergies or adverse reactions: No Arrival Time: 14:45 Had a fall or experienced change in No Accompanied By: self activities of daily living that may affect Transfer Assistance: None risk of falls: Patient Identification Verified: Yes Signs or symptoms of abuse/neglect since last visito No Secondary Verification Process Completed: Yes Hospitalized since last visit: No Patient Requires Transmission-Based Precautions: No Implantable device outside of the clinic excluding No Patient Has Alerts: Yes cellular tissue based products placed in the center Patient Alerts: ABI's: 11/21 L:1.17 since last visit: Has Dressing in Place as Prescribed: Yes Pain Present Now: No Electronic Signature(s) Signed: 01/15/2021 10:46:33 AM By: Sandre Kitty Entered By: Sandre Kitty on 01/11/2021 14:46:25 -------------------------------------------------------------------------------- Encounter Discharge Information Details Patient Name: Date of Service: [name], Madalee D. 01/11/2021 2:00 PM Medical Record Number: [id number] Patient Account Number: [id number] Date of Birth/Sex: Treating RN: 07/03/1970 (50 y.o. [name], [name] Primary Care Irvin Bastin: [name] Other Clinician: Referring Kegan Mckeithan: Treating Jazia Faraci/Extender: Sammuel Bailiff in Treatment: 11 Encounter Discharge Information  Items Post Procedure Vitals Discharge Condition: Stable Temperature (F): 98.7 Ambulatory Status: Ambulatory Pulse (bpm): 74 Discharge Destination: Home Respiratory Rate (breaths/min): 17 Transportation: Private Auto Blood Pressure (mmHg): 134/74 Accompanied By: self Schedule Follow-up Appointment: Yes Clinical Summary of Care: Patient Declined Electronic Signature(s) Signed: 01/11/2021 4:34:42 PM By: Rhae Hammock RN Entered By: Rhae Hammock on 01/11/2021 15:09:39 -------------------------------------------------------------------------------- Lower Extremity Assessment Details Patient Name: Date of Service: [name], [name] 01/11/2021 2:00 PM Medical Record Number: [id number] Patient Account Number: [id number] Date of Birth/Sex: Treating RN: 06/17/1970 (50 y.o. [name], [name] Primary Care Raequan Vanschaick: [name] Other Clinician: Referring Jawaan Adachi: Treating Albie Arizpe/Extender: Sammuel Bailiff in Treatment: 11 Edema Assessment Assessed: [name]: Yes] [name]: No] Edema: [Left: Ye] [Right: s] Calf Left: Right: Point of Measurement: 25 cm From Medial Instep 45 cm Ankle Left: Right: Point of Measurement: 9 cm From Medial Instep 25 cm Vascular Assessment Pulses: Dorsalis Pedis Palpable: [Left:Yes] Posterior Tibial Palpable: [Left:Yes] Electronic Signature(s) Signed: 01/11/2021 4:34:42 PM By: Rhae Hammock RN Entered By: Rhae Hammock on 01/11/2021 15:00:23 -------------------------------------------------------------------------------- Multi Wound Chart Details Patient Name: Date of Service: [name], [name] 01/11/2021 2:00 PM Medical Record Number: [id number] Patient Account Number: [id number] Date of Birth/Sex: Treating RN: [birth year] (50 y.o. [name], [name] Primary Care Zayonna Ayuso: [name] Other Clinician: Referring Lamonica Trueba: Treating Marcheta Horsey/Extender: Sammuel Bailiff in  Treatment: 11 Vital Signs Height(in): 67 Pulse(bpm): 84 Weight(lbs): 346 Blood Pressure(mmHg): 135/83 Body Mass Index(BMI): 54 Temperature(F): 97.8 Respiratory Rate(breaths/min): 20 Photos: [4:Left, Medial Lower Leg] [N/A:N/A N/A] Wound Location: [4:Gradually Appeared] [N/A:N/A] Wounding Event: [4:Venous Leg Ulcer] [N/A:N/A] Primary Etiology: [4:Sleep Apnea, Congestive Heart] [N/A:N/A] Comorbid History: [4:Failure, Coronary Artery Disease, Hypertension 09/26/2020] [N/A:N/A] Date Acquired: [4:11] [N/A:N/A] Weeks of Treatment: [4:Open] [N/A:N/A] Wound Status: [4:1x2.5x0.1] [N/A:N/A] Measurements L x W x D (cm) [4:1.963] [N/A:N/A] A (cm) : rea [4:0.196] [N/A:N/A] Volume (cm) : [4:87.90%] [N/A:N/A] % Reduction in A [4:rea: 88.00%] [N/A:N/A] % Reduction in Volume: [4:Full Thickness Without Exposed] [N/A:N/A] Classification: [4:Support Structures Medium] [  N/A:N/A] Exudate A mount: [4:Serosanguineous] [N/A:N/A] Exudate Type: [4:red, brown] [N/A:N/A] Exudate Color: [4:Distinct, outline attached] [N/A:N/A] Wound Margin: [4:Large (67-100%)] [N/A:N/A] Granulation A mount: [4:Red] [N/A:N/A] Granulation Quality: [4:None Present (0%)] [N/A:N/A] Necrotic A mount: [4:Fat Layer (Subcutaneous Tissue): Yes N/A] Exposed Structures: [4:Fascia: No Tendon: No Muscle: No Joint: No Bone: No Large (67-100%)] [N/A:N/A] Epithelialization: [4:Debridement - Excisional] [N/A:N/A] Debridement: Pre-procedure Verification/Time Out 15:06 [N/A:N/A] Taken: [4:Lidocaine] [N/A:N/A] Pain Control: [4:Subcutaneous, Slough] [N/A:N/A] Tissue Debrided: [4:Skin/Subcutaneous Tissue] [N/A:N/A] Level: [4:2.5] [N/A:N/A] Debridement A (sq cm): [4:rea Curette] [N/A:N/A] Instrument: [4:Minimum] [N/A:N/A] Bleeding: [4:Pressure] [N/A:N/A] Hemostasis A chieved: [4:0] [N/A:N/A] Procedural Pain: [4:0] [N/A:N/A] Post Procedural Pain: [4:Procedure was tolerated well] [N/A:N/A] Debridement Treatment Response: [4:1x2.5x0.1]  [N/A:N/A] Post Debridement Measurements L x W x D (cm) [4:0.196] [N/A:N/A] Post Debridement Volume: (cm) [4:Debridement] [N/A:N/A] Treatment Notes Wound #4 (Lower Leg) Wound Laterality: Left, Medial Cleanser Soap and Water Discharge Instruction: May shower and wash wound with dial antibacterial soap and water prior to dressing change. Peri-Wound Care Sween Lotion (Moisturizing lotion) Discharge Instruction: Apply moisturizing lotion as directed Topical Primary Dressing PolyMem Silver Non-Adhesive Dressing, 4.25x4.25 in Discharge Instruction: Apply to wound bed as instructed Secondary Dressing Woven Gauze Sponge, Non-Sterile 4x4 in Discharge Instruction: Apply over primary dressing as directed. ABD Pad, 8x10 Discharge Instruction: Apply over primary dressing as directed. Secured With Compression Wrap Unnaboot w/Calamine, 4x10 (in/yd) Discharge Instruction: unna at top of wrap to help secure CoFlex TLC XL 2-layer Compression System 4x7 (in/yd) Discharge Instruction: Apply CoFlex 2-layer compression as directed. (alt for 4 layer) Compression Stockings Add-Ons Electronic Signature(s) Signed: 01/11/2021 4:11:56 PM By: [name] DO Signed: 01/11/2021 4:34:42 PM By: Rhae Hammock RN Entered By: [name] on 01/11/2021 16:04:55 -------------------------------------------------------------------------------- Multi-Disciplinary Care Plan Details Patient Name: Date of Service: [name], Oneika D. 01/11/2021 2:00 PM Medical Record Number: [id number] Patient Account Number: [id number] Date of Birth/Sex: Treating RN: 01/22/1971 (50 y.o. [name], [name] Primary Care Ziana Heyliger: [name] Other Clinician: Referring Sharna Gabrys: Treating Kenyon Eshleman/Extender: Sammuel Bailiff in Treatment: 11 Multidisciplinary Care Plan reviewed with physician Active Inactive Venous Leg Ulcer Nursing Diagnoses: Actual venous Insuffiency (use after diagnosis  is confirmed) Knowledge deficit related to disease process and management Potential for venous Insuffiency (use before diagnosis confirmed) Goals: Patient will maintain optimal edema control Date Initiated: 10/20/2020 Target Resolution Date: 01/25/2021 Goal Status: Active Interventions: Assess peripheral edema status every visit. Compression as ordered Treatment Activities: Therapeutic compression applied : 10/20/2020 Notes: 12/28/20: Edema control ongoing. Wound/Skin Impairment Nursing Diagnoses: Impaired tissue integrity Knowledge deficit related to ulceration/compromised skin integrity Goals: Patient/caregiver will verbalize understanding of skin care regimen Date Initiated: 10/20/2020 Date Inactivated: 12/28/2020 Target Resolution Date: 12/29/2020 Goal Status: Met Ulcer/skin breakdown will have a volume reduction of 30% by week 4 Date Initiated: 10/20/2020 Date Inactivated: 12/14/2020 Target Resolution Date: 11/24/2020 Goal Status: Met Ulcer/skin breakdown will have a volume reduction of 80% by week 12 Date Initiated: 12/14/2020 Target Resolution Date: 01/18/2021 Goal Status: Active Interventions: Assess patient/caregiver ability to obtain necessary supplies Assess patient/caregiver ability to perform ulcer/skin care regimen upon admission and as needed Assess ulceration(s) every visit Provide education on ulcer and skin care Treatment Activities: Skin care regimen initiated : 10/20/2020 Topical wound management initiated : 10/20/2020 Notes: Electronic Signature(s) Signed: 01/11/2021 4:34:42 PM By: Rhae Hammock RN Entered By: Rhae Hammock on 01/11/2021 15:07:56 -------------------------------------------------------------------------------- Pain Assessment Details Patient Name: Date of Service: [name], Lyda D. 01/11/2021 2:00 PM Medical Record Number: [id number] Patient Account Number: [id number] Date of Birth/Sex: Treating  RN: May 06, 1970 (50 y.o. [name],  [name] Primary Care Janine Reller: [name] Other Clinician: Referring Kyan Yurkovich: Treating Jamarian Jacinto/Extender: Sammuel Bailiff in Treatment: 11 Active Problems Location of Pain Severity and Description of Pain Patient Has Paino No Site Locations Pain Management and Medication Current Pain Management: Electronic Signature(s) Signed: 01/11/2021 4:34:42 PM By: Rhae Hammock RN Signed: 01/15/2021 10:46:33 AM By: Sandre Kitty Entered By: Sandre Kitty on 01/11/2021 14:47:01 -------------------------------------------------------------------------------- Patient/Caregiver Education Details Patient Name: Date of Service: [name] 9/15/2022andnbsp2:00 PM Medical Record Number: [id number] Patient Account Number: [id number] Date of Birth/Gender: Treating RN: February 27, 1971 (50 y.o. [name], [name] Primary Care Physician: [name] Other Clinician: Referring Physician: Treating Physician/Extender: Sammuel Bailiff in Treatment: 11 Education Assessment Education Provided To: Patient Education Topics Provided Wound/Skin Impairment: Methods: Explain/Verbal Responses: State content correctly [employer]) Signed: 01/11/2021 4:34:42 PM By: Rhae Hammock RN Entered By: Rhae Hammock on 01/11/2021 15:08:16 -------------------------------------------------------------------------------- Wound Assessment Details Patient Name: Date of Service: [name], Laurissa D. 01/11/2021 2:00 PM Medical Record Number: [id number] Patient Account Number: [id number] Date of Birth/Sex: Treating RN: 05-02-1970 (50 y.o. [name] Primary Care Mekiyah Gladwell: [name] Other Clinician: Referring Dawn Kiper: Treating Amika Tassin/Extender: Sammuel Bailiff in Treatment: 11 Wound Status Wound Number: 4 Primary Venous Leg Ulcer Etiology: Wound Location: Left, Medial Lower Leg Wound  Open Wounding Event: Gradually Appeared Status: Date Acquired: 09/26/2020 Comorbid Sleep Apnea, Congestive Heart Failure, Coronary Artery Weeks Of Treatment: 11 History: Disease, Hypertension Clustered Wound: No Photos Wound Measurements Length: (cm) 1 Width: (cm) 2.5 Depth: (cm) 0.1 Area: (cm) 1.963 Volume: (cm) 0.196 % Reduction in Area: 87.9% % Reduction in Volume: 88% Epithelialization: Large (67-100%) Wound Description Classification: Full Thickness Without Exposed Support Structures Wound Margin: Distinct, outline attached Exudate Amount: Medium Exudate Type: Serosanguineous Exudate Color: red, brown Wound Bed Granulation Amount: Large (67-100%) Granulation Quality: Red Necrotic Amount: None Present (0%) Foul Odor After Cleansing: No Slough/Fibrino No Exposed Structure Fascia Exposed: No Fat Layer (Subcutaneous Tissue) Exposed: Yes Tendon Exposed: No Muscle Exposed: No Joint Exposed: No Bone Exposed: No Treatment Notes Wound #4 (Lower Leg) Wound Laterality: Left, Medial Cleanser Soap and Water Discharge Instruction: May shower and wash wound with dial antibacterial soap and water prior to dressing change. Peri-Wound Care Sween Lotion (Moisturizing lotion) Discharge Instruction: Apply moisturizing lotion as directed Topical Primary Dressing PolyMem Silver Non-Adhesive Dressing, 4.25x4.25 in Discharge Instruction: Apply to wound bed as instructed Secondary Dressing Woven Gauze Sponge, Non-Sterile 4x4 in Discharge Instruction: Apply over primary dressing as directed. ABD Pad, 8x10 Discharge Instruction: Apply over primary dressing as directed. Secured With Compression Wrap Unnaboot w/Calamine, 4x10 (in/yd) Discharge Instruction: unna at top of wrap to help secure CoFlex TLC XL 2-layer Compression System 4x7 (in/yd) Discharge Instruction: Apply CoFlex 2-layer compression as directed. (alt for 4 layer) Compression Stockings Add-Ons Electronic  Signature(s) Signed: 01/11/2021 5:57:57 PM By: [name] Entered By: [name] on 01/11/2021 14:51:26 -------------------------------------------------------------------------------- Vitals Details Patient Name: Date of Service: [name], Primrose D. 01/11/2021 2:00 PM Medical Record Number: [id number] Patient Account Number: [id number] Date of Birth/Sex: Treating RN: Sep 12, 1970 (50 y.o. [name], [name] Primary Care Sequoyah Ramone: [name] Other Clinician: Referring Otniel Hoe: Treating Larnie Heart/Extender: Sammuel Bailiff in Treatment: 11 Vital Signs Time Taken: 14:46 Temperature (F): 97.8 Height (in): 67 Pulse (bpm): 84 Weight (lbs): 346 Respiratory Rate (breaths/min): 20 Body Mass Index (BMI): 54.2 Blood Pressure (mmHg): 135/83 Reference Range: 80 - 120 mg / dl Electronic  Signature(s) Signed: 01/15/2021 10:46:33 AM By: Sandre Kitty Entered By: Sandre Kitty on 01/11/2021 14:46:49

## 2021-01-16 ENCOUNTER — Encounter (INDEPENDENT_AMBULATORY_CARE_PROVIDER_SITE_OTHER): Payer: Self-pay

## 2021-01-16 ENCOUNTER — Telehealth: Payer: Self-pay | Admitting: Internal Medicine

## 2021-01-16 NOTE — Progress Notes (Addendum)
DO NOT USE INCORRECT

## 2021-01-17 ENCOUNTER — Other Ambulatory Visit: Payer: Self-pay | Admitting: Internal Medicine

## 2021-01-17 ENCOUNTER — Telehealth: Payer: Self-pay | Admitting: Internal Medicine

## 2021-01-17 NOTE — Telephone Encounter (Signed)
Theres no medications attached and no message

## 2021-01-17 NOTE — Telephone Encounter (Signed)
Copied from [place] [id number] [id number]. Topic: General - Call Back - No Documentation >> Jan 12, 2021  5:02 PM Erick Blinks wrote: Reason for CRM: Pt has a procedure upcoming that she wants to speak to Dr. [name] about, she wants her advice. She says correspondence on MyChart is fine. She has an appt October 11th to remove her IV filter from her left leg. She wants to know if PCP recommends having this procedure or not.   Best contact: [phone]

## 2021-01-17 NOTE — Progress Notes (Signed)
Phone call returned to patient today.  Patient wanted to get my opinion about whether she should have the IVC filter taken out.  It has been in place since 2016.  She has seen the vascular surgeon Dr. Lucky Cowboy about this.  He told her that he can attempt to remove it but once they have been in place that long there is no guarantee that he would be able to get the filter out.  He discussed the risks and the benefits of attempted removal with her.  I informed patient that if the risks that he discussed with her are acceptable to her then she can have it removed.  I also inquired about how many previous episodes she has had of GI bleeding.  She states about 3-4.  I told her that in the event she has another GI bleed and the anticoagulant needs to be stopped, she would not have the added protection of having a filter in place to prevent a pulmonary embolism.  She needs to consider that in making her decision.  Patient expressed understanding.

## 2021-01-17 NOTE — Telephone Encounter (Signed)
Will forward to provider  

## 2021-01-17 NOTE — Telephone Encounter (Addendum)
Pt states she has been scheduled to have the filter in her leg taken out. Pt wants to know if Dr [name] is OK with getting the filter out.  Pt is anxious to hear what Dr [name] has to say.  Pt upset no one has called her back. I did advise pt Dr [name] tried to call her on 9/19 b/c her mailbox was full. Pt aware Dr [name] is not in the office today, but I would make sure she got the message when she comes in. Please advise.

## 2021-01-18 ENCOUNTER — Telehealth (INDEPENDENT_AMBULATORY_CARE_PROVIDER_SITE_OTHER): Payer: Self-pay | Admitting: Vascular Surgery

## 2021-01-18 NOTE — Telephone Encounter (Signed)
Patient has been cancel and will follow with Dr Delana Meyer within 3-4 weeks to discuss ivc filter removal. Patient is schedule for 02/08/21

## 2021-01-19 ENCOUNTER — Other Ambulatory Visit: Payer: Self-pay

## 2021-01-19 ENCOUNTER — Encounter (HOSPITAL_BASED_OUTPATIENT_CLINIC_OR_DEPARTMENT_OTHER): Payer: Medicaid Other | Admitting: Internal Medicine

## 2021-01-19 DIAGNOSIS — L97829 Non-pressure chronic ulcer of other part of left lower leg with unspecified severity: Secondary | ICD-10-CM | POA: Diagnosis not present

## 2021-01-19 NOTE — Progress Notes (Signed)
AFNAN, MANCINELLI ([id number]) Visit Report for 01/19/2021 SuperBill Details Patient Name: Date of Service: [name], [name] 01/19/2021 Medical Record Number: [id number] Patient Account Number: [id number] Date of Birth/Sex: Treating RN: 04-Dec-1970 (50 y.o. [name], Tammi Klippel Primary Care Provider: [name] Other Clinician: Referring Provider: Treating Provider/Extender: Sammuel Bailiff in Treatment: 13 Diagnosis Coding ICD-10 Codes Code Description [phone] Non-pressure chronic ulcer of other part of left lower leg with unspecified severity I87.2 Venous insufficiency (chronic) (peripheral) I89.0 Lymphedema, not elsewhere classified Z86.718 Personal history of other venous thrombosis and embolism Facility Procedures CPT4 Code Description Modifier Quantity [id number] (Facility Use Only) [phone] - APPLY MULTLAY COMPRS LWR LT LEG 1 Electronic Signature(s) Signed: 01/19/2021 12:28:55 PM By: [name] DO Signed: 01/19/2021 2:24:37 PM By: Deon Pilling RN, BSN Entered By: Deon Pilling on 01/19/2021 11:20:05

## 2021-01-19 NOTE — Progress Notes (Signed)
TINIYA, BENKE ([id number]) Visit Report for 01/19/2021 Arrival Information Details Patient Name: Date of Service: [name], [name] 01/19/2021 10:30 A M Medical Record Number: [id number] Patient Account Number: [id number] Date of Birth/Sex: Treating RN: 02-27-1971 (50 y.o. [name], [name] Primary Care Delfin Squillace: [name] Other Clinician: Referring Rmoni Keplinger: Treating Maram Bently/Extender: Sammuel Bailiff in Treatment: 13 Visit Information History Since Last Visit Added or deleted any medications: No Patient Arrived: Ambulatory Any new allergies or adverse reactions: No Arrival Time: 11:07 Had a fall or experienced change in No Accompanied By: self activities of daily living that may affect Transfer Assistance: None risk of falls: Patient Identification Verified: Yes Signs or symptoms of abuse/neglect since last visito No Secondary Verification Process Completed: Yes Hospitalized since last visit: No Patient Requires Transmission-Based Precautions: No Implantable device outside of the clinic excluding No Patient Has Alerts: Yes cellular tissue based products placed in the center Patient Alerts: ABI's: 11/21 L:1.17 since last visit: Has Dressing in Place as Prescribed: Yes Has Compression in Place as Prescribed: Yes Pain Present Now: No Electronic Signature(s) Signed: 01/19/2021 2:24:37 PM By: Deon Pilling RN, BSN Entered By: Deon Pilling on 01/19/2021 11:07:48 -------------------------------------------------------------------------------- Compression Therapy Details Patient Name: Date of Service: [name], [name] 01/19/2021 10:30 A M Medical Record Number: [id number] Patient Account Number: [id number] Date of Birth/Sex: Treating RN: 1970/09/17 (50 y.o. [name] Primary Care Mimi Debellis: [name] Other Clinician: Referring Taelor Moncada: Treating Amma Crear/Extender: Sammuel Bailiff in Treatment:  13 Compression Therapy Performed for Wound Assessment: Wound #4 Left,Medial Lower Leg Performed By: Clinician Deon Pilling, RN Compression Type: Double Layer Electronic Signature(s) Signed: 01/19/2021 2:24:37 PM By: Deon Pilling RN, BSN Entered By: Deon Pilling on 01/19/2021 11:19:28 -------------------------------------------------------------------------------- Encounter Discharge Information Details Patient Name: Date of Service: Megan Session D. 01/19/2021 10:30 A M Medical Record Number: [id number] Patient Account Number: [id number] Date of Birth/Sex: Treating RN: December 04, 1970 (51 y.o. [name] Primary Care Kenwood Rosiak: [name] Other Clinician: Referring Emina Ribaudo: Treating [name]/Extender: Sammuel Bailiff in Treatment: 13 Encounter Discharge Information Items Discharge Condition: Stable Ambulatory Status: Ambulatory Discharge Destination: Home Transportation: Private Auto Accompanied By: self Schedule Follow-up Appointment: Yes Clinical Summary of Care: Electronic Signature(s) Signed: 01/19/2021 2:24:37 PM By: Deon Pilling RN, BSN Entered By: Deon Pilling on 01/19/2021 11:19:58 -------------------------------------------------------------------------------- Patient/Caregiver Education Details Patient Name: Date of Service: Megan Session D. 9/23/2022andnbsp10:30 [place] Record Number: [id number] Patient Account Number: [id number] Date of Birth/Gender: Treating RN: 09/19/1970 (50 y.o. [name] Primary Care Physician: [name] Other Clinician: Referring Physician: Treating Physician/Extender: Sammuel Bailiff in Treatment: 13 Education Assessment Education Provided To: Patient Education Topics Provided Wound/Skin Impairment: Handouts: Skin Care Do's and Dont's Methods: Explain/Verbal Responses: Reinforcements needed Electronic Signature(s) Signed: 01/19/2021 2:24:37 PM By:  Deon Pilling RN, BSN Entered By: Deon Pilling on 01/19/2021 11:19:49 -------------------------------------------------------------------------------- Wound Assessment Details Patient Name: Date of Service: Megan Session D. 01/19/2021 10:30 A M Medical Record Number: [id number] Patient Account Number: [id number] Date of Birth/Sex: Treating RN: [birth year] (50 y.o. [name] Primary Care Edmund Rick: [name] Other Clinician: Referring Dalayla Aldredge: Treating [name]/Extender: Sammuel Bailiff in Treatment: 13 Wound Status Wound Number: 4 Primary Etiology: Venous Leg Ulcer Wound Location: Left, Medial Lower Leg Wound Status: Open Wounding Event: Gradually Appeared Date Acquired: 09/26/2020 Weeks Of Treatment: 13 Clustered Wound: No Wound Measurements Length: (cm) 1 Width: (cm) 2.5 Depth: (cm) 0.1 Area: (cm)  1.963 Volume: (cm) 0.196 % Reduction in Area: 87.9% % Reduction in Volume: 88% Wound Description Classification: Full Thickness Without Exposed Support Structu Exudate Amount: Medium Exudate Type: Serosanguineous Exudate Color: red, brown res Treatment Notes Wound #4 (Lower Leg) Wound Laterality: Left, Medial Cleanser Soap and Water Discharge Instruction: May shower and wash wound with dial antibacterial soap and water prior to dressing change. Peri-Wound Care Sween Lotion (Moisturizing lotion) Discharge Instruction: Apply moisturizing lotion as directed Topical Primary Dressing PolyMem Silver Non-Adhesive Dressing, 4.25x4.25 in Discharge Instruction: Apply to wound bed as instructed Secondary Dressing Woven Gauze Sponge, Non-Sterile 4x4 in Discharge Instruction: Apply over primary dressing as directed. ABD Pad, 8x10 Discharge Instruction: Apply over primary dressing as directed. Secured With Compression Wrap Unnaboot w/Calamine, 4x10 (in/yd) Discharge Instruction: unna at top of wrap to help secure CoFlex TLC XL 2-layer  Compression System 4x7 (in/yd) Discharge Instruction: Apply CoFlex 2-layer compression as directed. (alt for 4 layer) Compression Stockings Add-Ons Electronic Signature(s) Signed: 01/19/2021 2:24:37 PM By: Deon Pilling RN, BSN Entered By: Deon Pilling on 01/19/2021 11:19:10 -------------------------------------------------------------------------------- Vitals Details Patient Name: Date of Service: [name], [name] 01/19/2021 10:30 A M Medical Record Number: [id number] Patient Account Number: [id number] Date of Birth/Sex: Treating RN: 12-Jun-1970 (50 y.o. [name], [name] Primary Care Shalini Mair: [name] Other Clinician: Referring Janica Eldred: Treating Mordecai Tindol/Extender: Sammuel Bailiff in Treatment: 13 Vital Signs Time Taken: 11:07 Temperature (F): 98.3 Height (in): 67 Pulse (bpm): 73 Weight (lbs): 346 Respiratory Rate (breaths/min): 20 Body Mass Index (BMI): 54.2 Blood Pressure (mmHg): 129/84 Reference Range: 80 - 120 mg / dl Electronic Signature(s) Signed: 01/19/2021 2:24:37 PM By: Deon Pilling RN, BSN Entered By: Deon Pilling on 01/19/2021 11:08:01

## 2021-01-23 ENCOUNTER — Encounter (HOSPITAL_BASED_OUTPATIENT_CLINIC_OR_DEPARTMENT_OTHER): Payer: Medicaid Other | Admitting: Internal Medicine

## 2021-01-23 ENCOUNTER — Other Ambulatory Visit: Payer: Self-pay

## 2021-01-23 DIAGNOSIS — I890 Lymphedema, not elsewhere classified: Secondary | ICD-10-CM

## 2021-01-23 DIAGNOSIS — I872 Venous insufficiency (chronic) (peripheral): Secondary | ICD-10-CM

## 2021-01-23 DIAGNOSIS — L97829 Non-pressure chronic ulcer of other part of left lower leg with unspecified severity: Secondary | ICD-10-CM | POA: Diagnosis not present

## 2021-01-23 DIAGNOSIS — Z86718 Personal history of other venous thrombosis and embolism: Secondary | ICD-10-CM | POA: Diagnosis not present

## 2021-01-23 NOTE — Progress Notes (Signed)
FLOE, PRUETTE ([id number]) Visit Report for 01/23/2021 Chief Complaint Document Details Patient Name: Date of Service: [name], [name] 01/23/2021 3:15 PM Medical Record Number: [id number] Patient Account Number: [id number] Date of Birth/Sex: Treating RN: Mar 05, 1971 (50 y.o. [name] Primary Care Provider: [name] Other Clinician: Referring Provider: Treating Provider/Extender: Sammuel Bailiff in Treatment: 13 Information Obtained from: Patient Chief Complaint Left lower extremity wound Electronic Signature(s) Signed: 01/23/2021 4:40:11 PM By: [name] DO Entered By: [name] on 01/23/2021 16:33:15 -------------------------------------------------------------------------------- HPI Details Patient Name: Date of Service: [name], [name] 01/23/2021 3:15 PM Medical Record Number: [id number] Patient Account Number: [id number] Date of Birth/Sex: Treating RN: 02/22/1971 (50 y.o. [name] Primary Care Provider: [name] Other Clinician: Referring Provider: Treating Provider/Extender: Sammuel Bailiff in Treatment: 13 History of Present Illness HPI Description: Admission 6/24 [name] with a past medical history of chronic venous insufficiency, lymphedema, DVT on anticoagulation and diastolic heart failure that presents to the clinic for left lower extremity wound. She was last seen 4 months ago in our clinic for the same issue. The reoccurring wound started at the end of May and has been going on for 1 month. She has been using an ointment and I am unclear what this is. She tries to keep her leg elevated with her compression stocking. She also reports she has lymphedema pumps and has been using them as well. She reports mild pain to the area. She denies signs of infection including increased warmth, erythema or purulent drainage. 7/1; patient presents  for 1 week follow-up. She has tolerated the wrap well. Unfortunately she did have trouble with the wrap Sliding down her leg 2 days ago. She has no issues or complaints today. She denies signs of infection. 7/15; patient presents for follow-up. She has tolerated the wrap well. She has no issues or complaints today. She denies signs of infection. 7/25; patient presents for 1 week follow-up. She has no issues or complaints today. She denies signs of infection. 8/4; patient presents for follow-up. She has tolerated the compression wrap well to her left lower extremity. She states she was in the hospital last week due to fluid overload. She subsequently developed blisters to her bottom from being in the hospital bed for prolonged periods of time. These have since ruptured. She now has 2 areas of skin breakdown. She has been keeping gauze on them. She denies signs of infection. 8/18; patient presents for 2-week follow-up. She reports improvement to her buttocks wounds. She is able to tolerate the wrap well to her left lower extremity with no issues. She denies signs of infection. 9/1; patient presents for 2-week follow-up. She has no issues or complaints today. she denies signs of infection. 9/15; patient presents for 2-week follow-up. She has no issues or complaints today. She has tolerated the wrap well. She denies signs of infection. 9/27; patient presents for 1 week follow-up. She has no issues or complaints today. Electronic Signature(s) Signed: 01/23/2021 4:40:11 PM By: [name] DO Entered By: [name] on 01/23/2021 16:33:34 -------------------------------------------------------------------------------- Physical Exam Details Patient Name: Date of Service: Megan Session D. 01/23/2021 3:15 PM Medical Record Number: [id number] Patient Account Number: [id number] Date of Birth/Sex: Treating RN: 02-Aug-1970 (50 y.o. [name] Primary Care Provider: [name] Other  Clinician: Referring Provider: Treating Provider/Extender: Sammuel Bailiff in Treatment: 13 Constitutional respirations regular, non-labored and within target range  for patient.. Cardiovascular 2+ dorsalis pedis/posterior tibialis pulses. Psychiatric pleasant and cooperative. Notes Left lower extremity: On the medial aspect there is 1 open wound with granulation tissue present. No signs of infection. Good edema control. Electronic Signature(s) Signed: 01/23/2021 4:40:11 PM By: [name] DO Entered By: [name] on 01/23/2021 16:34:13 -------------------------------------------------------------------------------- Physician Orders Details Patient Name: Date of Service: Megan Session D. 01/23/2021 3:15 PM Medical Record Number: [id number] Patient Account Number: [id number] Date of Birth/Sex: Treating RN: 1970/12/26 (49 y.o. [name] Primary Care Provider: [name] Other Clinician: Referring Provider: Treating Provider/Extender: Sammuel Bailiff in Treatment: [phone] Verbal / Phone Orders: No Diagnosis Coding ICD-10 Coding Code Description [phone] Non-pressure chronic ulcer of other part of left lower leg with unspecified severity I87.2 Venous insufficiency (chronic) (peripheral) I89.0 Lymphedema, not elsewhere classified Z86.718 Personal history of other venous thrombosis and embolism Follow-up Appointments ppointment in 2 weeks. - Dr. Heber [place] on Friday 10/14 Return A Bathing/ Shower/ Hygiene May shower with protection but do not get wound dressing(s) wet. - use cast protector to keep wrap dry in the shower Edema Control - Lymphedema / SCD / Other Bilateral Lower Extremities Lymphedema Pumps. Use Lymphedema pumps on leg(s) 2-3 times a day for 45-60 minutes. If wearing any wraps or hose, do not remove them. Continue exercising as instructed. Elevate legs to the level of the heart or above for 30  minutes daily and/or when sitting, a frequency of: Avoid standing for long periods of time. Patient to wear own compression stockings every day. - right leg daily Exercise regularly Compression stocking or Garment 20-30 mm/Hg pressure to: - to left leg daily after compression wrap removed Off-Loading Turn and reposition every 2 hours - Ensure not to apply pressure to these areas. Additional Orders / Instructions Other: - remove wrap i 1 week then apply polymem silver to wound bed every other day, Wound Treatment Wound #4 - Lower Leg Wound Laterality: Left, Medial Cleanser: Soap and Water 1 x Per Week/15 Days Discharge Instructions: May shower and wash wound with dial antibacterial soap and water prior to dressing change. Peri-Wound Care: Sween Lotion (Moisturizing lotion) 1 x Per Week/15 Days Discharge Instructions: Apply moisturizing lotion as directed Topical: Gentamicin 1 x Per Week/15 Days Discharge Instructions: thin layer to wound bed in clinic Prim Dressing: PolyMem Silver Non-Adhesive Dressing, 4.25x4.25 in 1 x Per Week/15 Days ary Discharge Instructions: Apply to wound bed as instructed Secondary Dressing: Woven Gauze Sponge, Non-Sterile 4x4 in 1 x Per Week/15 Days Discharge Instructions: Apply over primary dressing as directed. Compression Wrap: Unnaboot w/Calamine, 4x10 (in/yd) 1 x Per Week/15 Days Discharge Instructions: unna at top of wrap to help secure Compression Wrap: CoFlex TLC XL 2-layer Compression System 4x7 (in/yd) 1 x Per Week/15 Days Discharge Instructions: Apply CoFlex 2-layer compression as directed. (alt for 4 layer) Electronic Signature(s) Signed: 01/23/2021 4:40:11 PM By: [name] DO Entered By: [name] on 01/23/2021 16:36:54 -------------------------------------------------------------------------------- Problem List Details Patient Name: Date of Service: [name], Annamae D. 01/23/2021 3:15 PM Medical Record Number: [id number] Patient  Account Number: [id number] Date of Birth/Sex: Treating RN: 12-29-1970 (50 y.o. [name] Primary Care Provider: [name] Other Clinician: Referring Provider: Treating Provider/Extender: Sammuel Bailiff in Treatment: 13 Active Problems ICD-10 Encounter Code Description Active Date MDM Diagnosis L97.829 Non-pressure chronic ulcer of other part of left lower leg with unspecified 10/20/2020 No Yes severity I87.2 Venous insufficiency (chronic) (peripheral) 10/20/2020 No Yes I89.0 Lymphedema, not elsewhere  classified 10/20/2020 No Yes Z86.718 Personal history of other venous thrombosis and embolism 10/20/2020 No Yes Inactive Problems Resolved Problems Electronic Signature(s) Signed: 01/23/2021 4:40:11 PM By: [name] DO Entered By: [name] on 01/23/2021 16:33:00 -------------------------------------------------------------------------------- Progress Note Details Patient Name: Date of Service: [name], Shermeka D. 01/23/2021 3:15 PM Medical Record Number: [id number] Patient Account Number: [id number] Date of Birth/Sex: Treating RN: [birth year] (50 y.o. [name] Primary Care Provider: [name] Other Clinician: Referring Provider: Treating Provider/Extender: Sammuel Bailiff in Treatment: 13 Subjective Chief Complaint Information obtained from Patient Left lower extremity wound History of Present Illness (HPI) Admission 6/24 Ms. Orenda Nie is a 50 year old female with a past medical history of chronic venous insufficiency, lymphedema, DVT on anticoagulation and diastolic heart failure that presents to the clinic for left lower extremity wound. She was last seen 4 months ago in our clinic for the same issue. The reoccurring wound started at the end of May and has been going on for 1 month. She has been using an ointment and I am unclear what this is. She tries to keep her leg elevated with  her compression stocking. She also reports she has lymphedema pumps and has been using them as well. She reports mild pain to the area. She denies signs of infection including increased warmth, erythema or purulent drainage. 7/1; patient presents for 1 week follow-up. She has tolerated the wrap well. Unfortunately she did have trouble with the wrap Sliding down her leg 2 days ago. She has no issues or complaints today. She denies signs of infection. 7/15; patient presents for follow-up. She has tolerated the wrap well. She has no issues or complaints today. She denies signs of infection. 7/25; patient presents for 1 week follow-up. She has no issues or complaints today. She denies signs of infection. 8/4; patient presents for follow-up. She has tolerated the compression wrap well to her left lower extremity. She states she was in the hospital last week due to fluid overload. She subsequently developed blisters to her bottom from being in the hospital bed for prolonged periods of time. These have since ruptured. She now has 2 areas of skin breakdown. She has been keeping gauze on them. She denies signs of infection. 8/18; patient presents for 2-week follow-up. She reports improvement to her buttocks wounds. She is able to tolerate the wrap well to her left lower extremity with no issues. She denies signs of infection. 9/1; patient presents for 2-week follow-up. She has no issues or complaints today. she denies signs of infection. 9/15; patient presents for 2-week follow-up. She has no issues or complaints today. She has tolerated the wrap well. She denies signs of infection. 9/27; patient presents for 1 week follow-up. She has no issues or complaints today. Patient History Information obtained from Patient. Family History Diabetes - Mother,Maternal Grandparents, Heart Disease - Paternal Grandparents, Hypertension - Mother,Maternal Grandparents, Lung Disease - Father,Paternal Grandparents, No family  history of Cancer, Hereditary Spherocytosis, Kidney Disease, Seizures, Stroke, Thyroid Problems, Tuberculosis. Social History Former smoker, Marital Status - Single, Alcohol Use - Never, Drug Use - No History, Caffeine Use - Daily. Medical History Eyes Denies history of Cataracts, Glaucoma, Optic Neuritis Ear/Nose/Mouth/Throat Denies history of Chronic sinus problems/congestion, Middle ear problems Hematologic/Lymphatic Denies history of Anemia, Hemophilia, Human Immunodeficiency Virus, Lymphedema, Sickle Cell Disease Respiratory Patient has history of Sleep Apnea Denies history of Aspiration, Asthma, Chronic Obstructive Pulmonary Disease (COPD), Pneumothorax, Tuberculosis Cardiovascular Patient has history of Congestive Heart Failure, Coronary  Artery Disease, Hypertension Denies history of Angina, Arrhythmia, Deep Vein Thrombosis, Hypotension, Myocardial Infarction, Peripheral Arterial Disease, Peripheral Venous Disease, Phlebitis, Vasculitis Gastrointestinal Denies history of Cirrhosis , Colitis, Crohnoos, Hepatitis A, Hepatitis B, Hepatitis C Endocrine Denies history of Type I Diabetes, Type II Diabetes Genitourinary Denies history of End Stage Renal Disease Immunological Denies history of Lupus Erythematosus, Raynaudoos, Scleroderma Integumentary (Skin) Denies history of History of Burn Musculoskeletal Denies history of Gout, Rheumatoid Arthritis, Osteoarthritis, Osteomyelitis Neurologic Denies history of Dementia, Neuropathy, Quadriplegia, Paraplegia, Seizure Disorder Oncologic Denies history of Received Chemotherapy, Received Radiation Psychiatric Denies history of Anorexia/bulimia, Confinement Anxiety Objective Constitutional respirations regular, non-labored and within target range for patient.. Vitals Time Taken: 3:51 PM, Height: 67 in, Source: Stated, Weight: 346 lbs, Source: Stated, BMI: 54.2, Temperature: 98 F, Pulse: 76 bpm, Respiratory Rate: 20 breaths/min,  Blood Pressure: 134/92 mmHg, Capillary Blood Glucose: 136 mg/dl. Cardiovascular 2+ dorsalis pedis/posterior tibialis pulses. Psychiatric pleasant and cooperative. General Notes: Left lower extremity: On the medial aspect there is 1 open wound with granulation tissue present. No signs of infection. Good edema control. Integumentary (Hair, Skin) Wound #4 status is Open. Original cause of wound was Gradually Appeared. The date acquired was: 09/26/2020. The wound has been in treatment 13 weeks. The wound is located on the Left,Medial Lower Leg. The wound measures 1.1cm length x 1.2cm width x 0.1cm depth; 1.037cm^2 area and 0.104cm^3 volume. There is Fat Layer (Subcutaneous Tissue) exposed. There is no tunneling or undermining noted. There is a small amount of serosanguineous drainage noted. The wound margin is flat and intact. There is large (67-100%) red granulation within the wound bed. There is no necrotic tissue within the wound bed. Assessment Active Problems ICD-10 Non-pressure chronic ulcer of other part of left lower leg with unspecified severity Venous insufficiency (chronic) (peripheral) Lymphedema, not elsewhere classified Personal history of other venous thrombosis and embolism Patient presents for follow-up. One of the 2 wounds have healed. The other remaining wound has enlarged. Patient will be out of town for the next 2 weeks. We will wrap her today and I asked her to take this off in 1 week. She has a Velcro compression wrap she can use for the following week. T oday we will add gentamicin to PolyMem silver under the wrap. I am concerned there is a bioburden that may have enlarged the wound however overall everything appears well-healing. No concerning features for soft tissue infection. Follow-up in 2 weeks. Procedures Wound #4 Pre-procedure diagnosis of Wound #4 is a Venous Leg Ulcer located on the Left,Medial Lower Leg . There was a Double Layer Compression Therapy Procedure by  Baruch Gouty, RN. Post procedure Diagnosis Wound #4: Same as Pre-Procedure Plan Follow-up Appointments: Return Appointment in 2 weeks. - Dr. Heber [place] on Friday 10/14 Bathing/ Shower/ Hygiene: May shower with protection but do not get wound dressing(s) wet. - use cast protector to keep wrap dry in the shower Edema Control - Lymphedema / SCD / Other: Lymphedema Pumps. Use Lymphedema pumps on leg(s) 2-3 times a day for 45-60 minutes. If wearing any wraps or hose, do not remove them. Continue exercising as instructed. Elevate legs to the level of the heart or above for 30 minutes daily and/or when sitting, a frequency of: Avoid standing for long periods of time. Patient to wear own compression stockings every day. - right leg daily Exercise regularly Compression stocking or Garment 20-30 mm/Hg pressure to: - to left leg daily after compression wrap removed Off-Loading: Turn and reposition every 2  hours - Ensure not to apply pressure to these areas. Additional Orders / Instructions: Other: - remove wrap i 1 week then apply polymem silver to wound bed every other day, WOUND #4: - Lower Leg Wound Laterality: Left, Medial Cleanser: Soap and Water 1 x Per Week/15 Days Discharge Instructions: May shower and wash wound with dial antibacterial soap and water prior to dressing change. Peri-Wound Care: Sween Lotion (Moisturizing lotion) 1 x Per Week/15 Days Discharge Instructions: Apply moisturizing lotion as directed Topical: Gentamicin 1 x Per Week/15 Days Discharge Instructions: thin layer to wound bed in clinic Prim Dressing: PolyMem Silver Non-Adhesive Dressing, 4.25x4.25 in 1 x Per Week/15 Days ary Discharge Instructions: Apply to wound bed as instructed Secondary Dressing: Woven Gauze Sponge, Non-Sterile 4x4 in 1 x Per Week/15 Days Discharge Instructions: Apply over primary dressing as directed. Com pression Wrap: Unnaboot w/Calamine, 4x10 (in/yd) 1 x Per Week/15 Days Discharge  Instructions: unna at top of wrap to help secure Com pression Wrap: CoFlex TLC XL 2-layer Compression System 4x7 (in/yd) 1 x Per Week/15 Days Discharge Instructions: Apply CoFlex 2-layer compression as directed. (alt for 4 layer) 1. Gentamicin with PolyMem silver under compression wrap 2. T the wrap off in 1 week and use Velcro compression ake 3. Follow-up in 2 weeks Electronic Signature(s) Signed: 01/23/2021 4:40:11 PM By: [name] DO Entered By: [name] on 01/23/2021 16:39:41 -------------------------------------------------------------------------------- HxROS Details Patient Name: Date of Service: [name], Pearley D. 01/23/2021 3:15 PM Medical Record Number: [id number] Patient Account Number: [id number] Date of Birth/Sex: Treating RN: 1970-06-14 (50 y.o. [name] Primary Care Provider: [name] Other Clinician: Referring Provider: Treating Provider/Extender: Sammuel Bailiff in Treatment: 13 Information Obtained From Patient Eyes Medical History: Negative for: Cataracts; Glaucoma; Optic Neuritis Ear/Nose/Mouth/Throat Medical History: Negative for: Chronic sinus problems/congestion; Middle ear problems Hematologic/Lymphatic Medical History: Negative for: Anemia; Hemophilia; Human Immunodeficiency Virus; Lymphedema; Sickle Cell Disease Respiratory Medical History: Positive for: Sleep Apnea Negative for: Aspiration; Asthma; Chronic Obstructive Pulmonary Disease (COPD); Pneumothorax; Tuberculosis Cardiovascular Medical History: Positive for: Congestive Heart Failure; Coronary Artery Disease; Hypertension Negative for: Angina; Arrhythmia; Deep Vein Thrombosis; Hypotension; Myocardial Infarction; Peripheral Arterial Disease; Peripheral Venous Disease; Phlebitis; Vasculitis Gastrointestinal Medical History: Negative for: Cirrhosis ; Colitis; Crohns; Hepatitis A; Hepatitis B; Hepatitis C Endocrine Medical  History: Negative for: Type I Diabetes; Type II Diabetes Genitourinary Medical History: Negative for: End Stage Renal Disease Immunological Medical History: Negative for: Lupus Erythematosus; Raynauds; Scleroderma Integumentary (Skin) Medical History: Negative for: History of Burn Musculoskeletal Medical History: Negative for: Gout; Rheumatoid Arthritis; Osteoarthritis; Osteomyelitis Neurologic Medical History: Negative for: Dementia; Neuropathy; Quadriplegia; Paraplegia; Seizure Disorder Oncologic Medical History: Negative for: Received Chemotherapy; Received Radiation Psychiatric Medical History: Negative for: Anorexia/bulimia; Confinement Anxiety Immunizations Pneumococcal Vaccine: Received Pneumococcal Vaccination: No Implantable Devices None Family and Social History Cancer: No; Diabetes: Yes - Mother,Maternal Grandparents; Heart Disease: Yes - Paternal Grandparents; Hereditary Spherocytosis: No; Hypertension: Yes - Mother,Maternal Grandparents; Kidney Disease: No; Lung Disease: Yes - Father,Paternal Grandparents; Seizures: No; Stroke: No; Thyroid Problems: No; Tuberculosis: No; Former smoker; Marital Status - Single; Alcohol Use: Never; Drug Use: No History; Caffeine Use: Daily; Financial Concerns: No; Food, Clothing or Shelter Needs: No; Support System Lacking: No; Transportation Concerns: No Electronic Signature(s) Signed: 01/23/2021 4:40:11 PM By: [name] DO Signed: 01/23/2021 5:46:45 PM By: Deon Pilling RN, BSN Entered By: [name] on 01/23/2021 16:33:45 -------------------------------------------------------------------------------- SuperBill Details Patient Name: Date of Service: [name], Keisy D. 01/23/2021 Medical Record Number: [id number] Patient Account  Number: [id number] Date of Birth/Sex: Treating RN: [birth year] (50 y.o. [name] Primary Care Provider: [name] Other Clinician: Referring Provider: Treating  Provider/Extender: Sammuel Bailiff in Treatment: 13 Diagnosis Coding ICD-10 Codes Code Description [phone] Non-pressure chronic ulcer of other part of left lower leg with unspecified severity I87.2 Venous insufficiency (chronic) (peripheral) I89.0 Lymphedema, not elsewhere classified Z86.718 Personal history of other venous thrombosis and embolism Facility Procedures CPT4 Code: [id number] ( Description: Facility Use Only) [id number] - [facility] LEG Modifier: Quantity: 1 Physician Procedures : CPT4 Code Description Modifier [id number] - WC PHYS LEVEL 3 - EST PT ICD-10 Diagnosis Description L97.829 Non-pressure chronic ulcer of other part of left lower leg with unspecified severity I87.2 Venous insufficiency (chronic) (peripheral) I89.0  Lymphedema, not elsewhere classified Z86.718 Personal history of other venous thrombosis and embolism Quantity: 1 Electronic Signature(s) Signed: 01/23/2021 4:40:11 PM By: [name] DO Entered By: [name] on 01/23/2021 16:39:55

## 2021-01-24 NOTE — Progress Notes (Signed)
XYLIA, SCHERGER ([id number]) Visit Report for 01/23/2021 Arrival Information Details Patient Name: Date of Service: [name], [name] 01/23/2021 3:15 PM Medical Record Number: [id number] Patient Account Number: [id number] Date of Birth/Sex: Treating RN: 03-03-1971 (50 y.o. Martyn Malay, Linda Primary Care Merleen Picazo: [name] Other Clinician: Referring Raef Sprigg: Treating Celestia Duva/Extender: Sammuel Bailiff in Treatment: 13 Visit Information History Since Last Visit Added or deleted any medications: No Patient Arrived: Ambulatory Any new allergies or adverse reactions: No Arrival Time: 15:47 Had a fall or experienced change in No Accompanied By: self activities of daily living that may affect Transfer Assistance: None risk of falls: Patient Identification Verified: Yes Signs or symptoms of abuse/neglect since last visito No Secondary Verification Process Completed: Yes Hospitalized since last visit: No Patient Requires Transmission-Based Precautions: No Implantable device outside of the clinic excluding No Patient Has Alerts: Yes cellular tissue based products placed in the center Patient Alerts: ABI's: 11/21 L:1.17 since last visit: Has Dressing in Place as Prescribed: Yes Has Compression in Place as Prescribed: Yes Pain Present Now: No Electronic Signature(s) Signed: 01/24/2021 5:46:28 PM By: Baruch Gouty RN, BSN Entered By: Baruch Gouty on 01/23/2021 15:51:14 -------------------------------------------------------------------------------- Compression Therapy Details Patient Name: Date of Service: [name], [name] 01/23/2021 3:15 PM Medical Record Number: [id number] Patient Account Number: [id number] Date of Birth/Sex: Treating RN: 08/02/1970 (50 y.o. [name] Primary Care Tonimarie Gritz: [name] Other Clinician: Referring Nishtha Raider: Treating Toby Ayad/Extender: Sammuel Bailiff in Treatment:  13 Compression Therapy Performed for Wound Assessment: Wound #4 Left,Medial Lower Leg Performed By: Clinician Baruch Gouty, RN Compression Type: Double Layer Post Procedure Diagnosis Same as Pre-procedure Electronic Signature(s) Signed: 01/24/2021 5:46:28 PM By: Baruch Gouty RN, BSN Entered By: Baruch Gouty on 01/23/2021 16:05:14 -------------------------------------------------------------------------------- Encounter Discharge Information Details Patient Name: Date of Service: Megan Session D. 01/23/2021 3:15 PM Medical Record Number: [id number] Patient Account Number: [id number] Date of Birth/Sex: Treating RN: [birth year] (50 y.o. [name] Primary Care Schawn Byas: [name] Other Clinician: Referring Shatana Saxton: Treating Lajuan Kovaleski/Extender: Sammuel Bailiff in Treatment: 13 Encounter Discharge Information Items Discharge Condition: Stable Ambulatory Status: Ambulatory Discharge Destination: Home Transportation: Private Auto Accompanied By: self Schedule Follow-up Appointment: Yes Clinical Summary of Care: Patient Declined Electronic Signature(s) Signed: 01/24/2021 5:46:28 PM By: Baruch Gouty RN, BSN Entered By: Baruch Gouty on 01/23/2021 16:21:38 -------------------------------------------------------------------------------- Lower Extremity Assessment Details Patient Name: Date of Service: Megan Session D. 01/23/2021 3:15 PM Medical Record Number: [id number] Patient Account Number: [id number] Date of Birth/Sex: Treating RN: November 21, 1970 (50 y.o. [name] Primary Care Berdia Lachman: [name] Other Clinician: Referring Mahdiya Mossberg: Treating Leilyn Frayre/Extender: Sammuel Bailiff in Treatment: 13 Edema Assessment Assessed: [name]: No] [name]: No] Edema: [Left: Ye] [Right: s] Calf Left: Right: Point of Measurement: 25 cm From Medial Instep 45 cm Ankle Left: Right: Point of  Measurement: 9 cm From Medial Instep 24.5 cm Vascular Assessment Pulses: Dorsalis Pedis Palpable: [Left:Yes] Electronic Signature(s) Signed: 01/24/2021 5:46:28 PM By: Baruch Gouty RN, BSN Entered By: Baruch Gouty on 01/23/2021 15:57:57 -------------------------------------------------------------------------------- Multi Wound Chart Details Patient Name: Date of Service: [name], [name] 01/23/2021 3:15 PM Medical Record Number: [id number] Patient Account Number: [id number] Date of Birth/Sex: Treating RN: 28-Jun-1970 (50 y.o. [name] Primary Care Fumio Vandam: [name] Other Clinician: Referring Chanti Golubski: Treating Mertice Uffelman/Extender: Sammuel Bailiff in Treatment: 13 Vital Signs Height(in): 67 Capillary Blood Glucose(mg/dl): 136 Weight(lbs): 346 Pulse(bpm): 76 Body Mass Index(BMI): 54 Blood  Pressure(mmHg): 134/92 Temperature(F): 98 Respiratory Rate(breaths/min): 20 Photos: [N/A:N/A] Left, Medial Lower Leg N/A N/A Wound Location: Gradually Appeared N/A N/A Wounding Event: Venous Leg Ulcer N/A N/A Primary Etiology: Sleep Apnea, Congestive Heart N/A N/A Comorbid History: Failure, Coronary Artery Disease, Hypertension 09/26/2020 N/A N/A Date Acquired: 13 N/A N/A Weeks of Treatment: Open N/A N/A Wound Status: 1.1x1.2x0.1 N/A N/A Measurements L x W x D (cm) 1.037 N/A N/A A (cm) : rea 0.104 N/A N/A Volume (cm) : 93.60% N/A N/A % Reduction in Area: 93.60% N/A N/A % Reduction in Volume: Full Thickness Without Exposed N/A N/A Classification: Support Structures Small N/A N/A Exudate Amount: Serosanguineous N/A N/A Exudate Type: red, brown N/A N/A Exudate Color: Flat and Intact N/A N/A Wound Margin: Large (67-100%) N/A N/A Granulation Amount: Red N/A N/A Granulation Quality: None Present (0%) N/A N/A Necrotic Amount: Fat Layer (Subcutaneous Tissue): Yes N/A N/A Exposed Structures: Fascia: No Tendon:  No Muscle: No Joint: No Bone: No Medium (34-66%) N/A N/A Epithelialization: Compression Therapy N/A N/A Procedures Performed: Treatment Notes Wound #4 (Lower Leg) Wound Laterality: Left, Medial Cleanser Soap and Water Discharge Instruction: May shower and wash wound with dial antibacterial soap and water prior to dressing change. Peri-Wound Care Sween Lotion (Moisturizing lotion) Discharge Instruction: Apply moisturizing lotion as directed Topical Gentamicin Discharge Instruction: thin layer to wound bed in clinic Primary Dressing PolyMem Silver Non-Adhesive Dressing, 4.25x4.25 in Discharge Instruction: Apply to wound bed as instructed Secondary Dressing Woven Gauze Sponge, Non-Sterile 4x4 in Discharge Instruction: Apply over primary dressing as directed. Secured With Compression Wrap Unnaboot w/Calamine, 4x10 (in/yd) Discharge Instruction: unna at top of wrap to help secure CoFlex TLC XL 2-layer Compression System 4x7 (in/yd) Discharge Instruction: Apply CoFlex 2-layer compression as directed. (alt for 4 layer) Compression Stockings Add-Ons Electronic Signature(s) Signed: 01/23/2021 4:40:11 PM By: [name] DO Signed: 01/23/2021 5:46:45 PM By: Deon Pilling RN, BSN Entered By: [name] on 01/23/2021 16:33:05 -------------------------------------------------------------------------------- [place] Details Patient Name: Date of Service: Megan Session D. 01/23/2021 3:15 PM Medical Record Number: [id number] Patient Account Number: [id number] Date of Birth/Sex: Treating RN: Jul 30, 1970 (50 y.o. [name] Primary Care Reba Hulett: [name] Other Clinician: Referring Oluwadarasimi Redmon: Treating Kaedynce Tapp/Extender: Sammuel Bailiff in Treatment: 13 Multidisciplinary Care Plan reviewed with physician Active Inactive Venous Leg Ulcer Nursing Diagnoses: Actual venous Insuffiency (use after diagnosis is  confirmed) Knowledge deficit related to disease process and management Potential for venous Insuffiency (use before diagnosis confirmed) Goals: Patient will maintain optimal edema control Date Initiated: 10/20/2020 Target Resolution Date: 02/22/2021 Goal Status: Active Interventions: Assess peripheral edema status every visit. Compression as ordered Treatment Activities: Therapeutic compression applied : 10/20/2020 Notes: 12/28/20: Edema control ongoing. Wound/Skin Impairment Nursing Diagnoses: Impaired tissue integrity Knowledge deficit related to ulceration/compromised skin integrity Goals: Patient/caregiver will verbalize understanding of skin care regimen Date Initiated: 10/20/2020 Date Inactivated: 12/28/2020 Target Resolution Date: 12/29/2020 Goal Status: Met Ulcer/skin breakdown will have a volume reduction of 30% by week 4 Date Initiated: 10/20/2020 Date Inactivated: 12/14/2020 Target Resolution Date: 11/24/2020 Goal Status: Met Ulcer/skin breakdown will have a volume reduction of 80% by week 12 Date Initiated: 12/14/2020 Date Inactivated: 01/23/2021 Target Resolution Date: 01/18/2021 Goal Status: Met Ulcer/skin breakdown will heal within 14 weeks Date Initiated: 01/23/2021 Target Resolution Date: 02/08/2021 Goal Status: Active Interventions: Assess patient/caregiver ability to obtain necessary supplies Assess patient/caregiver ability to perform ulcer/skin care regimen upon admission and as needed Assess ulceration(s) every visit Provide education on ulcer and skin care Treatment  Activities: Skin care regimen initiated : 10/20/2020 Topical wound management initiated : 10/20/2020 Notes: Electronic Signature(s) Signed: 01/24/2021 5:46:28 PM By: Baruch Gouty RN, BSN Entered By: Baruch Gouty on 01/23/2021 16:02:04 -------------------------------------------------------------------------------- Pain Assessment Details Patient Name: Date of Service: [name], [name]  01/23/2021 3:15 PM Medical Record Number: [id number] Patient Account Number: [id number] Date of Birth/Sex: Treating RN: 07-Jul-1970 (50 y.o. [name] Primary Care Gaines Cartmell: [name] Other Clinician: Referring Aida Lemaire: Treating Claire Dolores/Extender: Sammuel Bailiff in Treatment: 13 Active Problems Location of Pain Severity and Description of Pain Patient Has Paino No Site Locations Rate the pain. Current Pain Level: 0 Pain Management and Medication Current Pain Management: Electronic Signature(s) Signed: 01/24/2021 5:46:28 PM By: Baruch Gouty RN, BSN Entered By: Baruch Gouty on 01/23/2021 15:52:54 -------------------------------------------------------------------------------- Patient/Caregiver Education Details Patient Name: Date of Service: Megan Session D. 9/27/2022andnbsp3:15 PM Medical Record Number: [id number] Patient Account Number: [id number] Date of Birth/Gender: Treating RN: Jun 23, 1970 (51 y.o. [name] Primary Care Physician: [name] Other Clinician: Referring Physician: Treating Physician/Extender: Sammuel Bailiff in Treatment: 13 Education Assessment Education Provided To: Patient Education Topics Provided Venous: Methods: Explain/Verbal Responses: Reinforcements needed, State content correctly Wound/Skin Impairment: Methods: Explain/Verbal Responses: Reinforcements needed, State content correctly Electronic Signature(s) Signed: 01/24/2021 5:46:28 PM By: Baruch Gouty RN, BSN Entered By: Baruch Gouty on 01/23/2021 16:02:42 -------------------------------------------------------------------------------- Wound Assessment Details Patient Name: Date of Service: Megan Session D. 01/23/2021 3:15 PM Medical Record Number: [id number] Patient Account Number: [id number] Date of Birth/Sex: Treating RN: August 25, 1970 (50 y.o. [name] Primary Care Tyton Abdallah:  [name] Other Clinician: Referring Lennon Boutwell: Treating Karime Scheuermann/Extender: Sammuel Bailiff in Treatment: 13 Wound Status Wound Number: 4 Primary Venous Leg Ulcer Etiology: Wound Location: Left, Medial Lower Leg Wound Open Wounding Event: Gradually Appeared Status: Date Acquired: 09/26/2020 Comorbid Sleep Apnea, Congestive Heart Failure, Coronary Artery Weeks Of Treatment: 13 History: Disease, Hypertension Clustered Wound: No Photos Wound Measurements Length: (cm) 1.1 Width: (cm) 1.2 Depth: (cm) 0.1 Area: (cm) 1.037 Volume: (cm) 0.104 % Reduction in Area: 93.6% % Reduction in Volume: 93.6% Epithelialization: Medium (34-66%) Tunneling: No Undermining: No Wound Description Classification: Full Thickness Without Exposed Support Str Wound Margin: Flat and Intact Exudate Amount: Small Exudate Type: Serosanguineous Exudate Color: red, brown uctures Wound Bed Granulation Amount: Large (67-100%) Exposed Structure Granulation Quality: Red Fascia Exposed: No Necrotic Amount: None Present (0%) Fat Layer (Subcutaneous Tissue) Exposed: Yes Tendon Exposed: No Muscle Exposed: No Joint Exposed: No Bone Exposed: No Treatment Notes Wound #4 (Lower Leg) Wound Laterality: Left, Medial Cleanser Soap and Water Discharge Instruction: May shower and wash wound with dial antibacterial soap and water prior to dressing change. Peri-Wound Care Sween Lotion (Moisturizing lotion) Discharge Instruction: Apply moisturizing lotion as directed Topical Gentamicin Discharge Instruction: thin layer to wound bed in clinic Primary Dressing PolyMem Silver Non-Adhesive Dressing, 4.25x4.25 in Discharge Instruction: Apply to wound bed as instructed Secondary Dressing Woven Gauze Sponge, Non-Sterile 4x4 in Discharge Instruction: Apply over primary dressing as directed. Secured With Compression Wrap Unnaboot w/Calamine, 4x10 (in/yd) Discharge Instruction: unna at  top of wrap to help secure CoFlex TLC XL 2-layer Compression System 4x7 (in/yd) Discharge Instruction: Apply CoFlex 2-layer compression as directed. (alt for 4 layer) Compression Stockings Add-Ons Electronic Signature(s) Signed: 01/24/2021 11:11:37 AM By: Sandre Kitty Signed: 01/24/2021 5:46:28 PM By: Baruch Gouty RN, BSN Entered By: Sandre Kitty on 01/23/2021 16:01:02 -------------------------------------------------------------------------------- Vitals Details Patient Name: Date of Service: [name], [name]  01/23/2021 3:15 PM Medical Record Number: [id number] Patient Account Number: [id number] Date of Birth/Sex: Treating RN: Aug 30, 1970 (50 y.o. [name] Primary Care Brinnley Lacap: [name] Other Clinician: Referring Trayce Caravello: Treating Latarshia [place]/Extender: Sammuel Bailiff in Treatment: 13 Vital Signs Time Taken: 15:51 Temperature (F): 98 Height (in): 67 Pulse (bpm): 76 Source: Stated Respiratory Rate (breaths/min): 20 Weight (lbs): 346 Blood Pressure (mmHg): 134/92 Source: Stated Capillary Blood Glucose (mg/dl): 136 Body Mass Index (BMI): 54.2 Reference Range: 80 - 120 mg / dl Electronic Signature(s) Signed: 01/24/2021 5:46:28 PM By: Baruch Gouty RN, BSN Entered By: Baruch Gouty on 01/23/2021 15:52:42

## 2021-01-29 ENCOUNTER — Inpatient Hospital Stay: Admission: RE | Admit: 2021-01-29 | Payer: Medicaid Other | Source: Ambulatory Visit

## 2021-01-29 ENCOUNTER — Ambulatory Visit: Payer: Medicaid Other | Admitting: Cardiology

## 2021-02-02 ENCOUNTER — Other Ambulatory Visit: Payer: Medicaid Other

## 2021-02-03 ENCOUNTER — Other Ambulatory Visit: Payer: Self-pay | Admitting: Internal Medicine

## 2021-02-03 DIAGNOSIS — F411 Generalized anxiety disorder: Secondary | ICD-10-CM

## 2021-02-03 NOTE — Telephone Encounter (Signed)
Requested Prescriptions  Pending Prescriptions Disp Refills  . PARoxetine (PAXIL) 10 MG tablet [Pharmacy Med Name: PARoxetine HCL 10 MG TABLET] 30 tablet 0    Sig: TAKE ONE TABLET BY MOUTH DAILY     Psychiatry:  Antidepressants - SSRI Passed - 02/03/2021  6:51 AM      Passed - Valid encounter within last 6 months    Recent Outpatient Visits          1 month ago Preoperative evaluation to rule out surgical contraindication   [facility] [name] B, MD   2 months ago Type 2 diabetes mellitus with hyperglycemia, unspecified whether long term insulin use [facility])   [place], [place], [place]   7 months ago Chronic midline thoracic back pain   [place], MD   10 months ago Establishing care with new doctor, encounter for   [place], MD      Future Appointments            In 1 week Leonie Man, MD [place], [facility]   In 3 weeks Ladell Pier, MD [place]

## 2021-02-06 ENCOUNTER — Ambulatory Visit: Admit: 2021-02-06 | Payer: Medicaid Other | Admitting: Vascular Surgery

## 2021-02-06 DIAGNOSIS — N186 End stage renal disease: Secondary | ICD-10-CM

## 2021-02-06 SURGERY — IVC FILTER REMOVAL
Anesthesia: Moderate Sedation

## 2021-02-08 ENCOUNTER — Ambulatory Visit (INDEPENDENT_AMBULATORY_CARE_PROVIDER_SITE_OTHER): Payer: Medicaid Other | Admitting: Vascular Surgery

## 2021-02-12 ENCOUNTER — Encounter (HOSPITAL_BASED_OUTPATIENT_CLINIC_OR_DEPARTMENT_OTHER): Payer: Medicaid Other | Admitting: Internal Medicine

## 2021-02-14 ENCOUNTER — Ambulatory Visit (INDEPENDENT_AMBULATORY_CARE_PROVIDER_SITE_OTHER): Payer: Medicaid Other | Admitting: Cardiology

## 2021-02-14 ENCOUNTER — Other Ambulatory Visit: Payer: Self-pay

## 2021-02-14 ENCOUNTER — Encounter: Payer: Self-pay | Admitting: Cardiology

## 2021-02-14 VITALS — BP 145/86 | HR 74 | Ht 62.0 in | Wt 350.6 lb

## 2021-02-14 DIAGNOSIS — I5032 Chronic diastolic (congestive) heart failure: Secondary | ICD-10-CM | POA: Diagnosis not present

## 2021-02-14 DIAGNOSIS — I872 Venous insufficiency (chronic) (peripheral): Secondary | ICD-10-CM

## 2021-02-14 DIAGNOSIS — I82562 Chronic embolism and thrombosis of left calf muscular vein: Secondary | ICD-10-CM | POA: Diagnosis not present

## 2021-02-14 DIAGNOSIS — I10 Essential (primary) hypertension: Secondary | ICD-10-CM

## 2021-02-14 DIAGNOSIS — Z95828 Presence of other vascular implants and grafts: Secondary | ICD-10-CM

## 2021-02-14 DIAGNOSIS — I890 Lymphedema, not elsewhere classified: Secondary | ICD-10-CM | POA: Diagnosis not present

## 2021-02-14 MED ORDER — VALSARTAN 160 MG PO TABS: 160.0000 mg | ORAL_TABLET | Freq: Every day | ORAL | 3 refills | Status: DC

## 2021-02-14 NOTE — Patient Instructions (Addendum)
Medication Instructions:   Increase Valsartan 160 mg  one tablet  daily  *If you need a refill on your cardiac medications before your next appointment, please call your pharmacy*   Lab Work: Not needed   Testing/Procedures: Not needed   Follow-Up: At [facility], you and your health needs are our priority.  As part of our continuing mission to provide you with exceptional heart care, we have created designated Provider Care Teams.  These Care Teams include your primary Cardiologist (physician) and Advanced Practice Providers (APPs -  Physician Assistants and Nurse Practitioners) who all work together to provide you with the care you need, when you need it.     Your next appointment:   5-6 month(s)  The format for your next appointment:   In Person  Provider:   Glenetta Hew, MD   Other Instructions

## 2021-02-14 NOTE — Progress Notes (Signed)
Primary Care Provider: Ladell Pier, MD Cardiologist: Glenetta Hew, MD Electrophysiologist: None  Clinic Note: Chief Complaint  Patient presents with   Hospitalization Follow-up    Follow-up after admission for FUO with some mild HFpEF exacerbation.     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  Unfortunately the Dopplers did not really show much in the way of venous insufficiency.  Continue compression stockings, foot elevation and most importantly Eliquis.  She remains on 80 mg daily Lasix along with spironolactone.      Relevant Medications   valsartan (DIOVAN) 160 MG tablet   Venous insufficiency (Chronic)    Interestingly, her Dopplers did not show significant venous insufficiency.  She clearly has blood phlebitic changes worse on the left side than the right.  She still has IVC paternal place.  I do sort of agree with not trying to take it out has been in place for long time and rest may be great.  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  Continue compression stockings, foot elevation and diuretics.  We talked about appropriate foot elevation techniques.  Talked about sliding scale Lasix dosing-can consider metolazone for exacerbations if necessary.      Relevant Medications   valsartan (DIOVAN) 160 MG tablet   Resistant hypertension (Chronic)    Blood pressure looks better today, but still not at goal.  She remains on Norvasc which we will continue for now but may potentially increase if not able to get control with current meds.  Plan for now is to increase valsartan back to 160 mg which had done the last visit.  Unfortunate this did not happen.   Continue high-dose carvedilol along with spironolactone 25 mg.         Relevant Medications   valsartan (DIOVAN) 160 MG tablet   Other Relevant Orders   EKG 12-Lead (Completed)   (HFpEF) heart failure with preserved ejection fraction (HCC) (Chronic)    Only grade 1 diastolic function noted on echo.  She may very well have pulmonary hypertension possibly related to chronic PE or even potentially OSA.  The plan was for her to have a sleep study done, and I recommend that this continues to be followed up on.  Certainly, if there are any more episodes, we could consider right heart catheterization to better assess.  She is on carvedilol 25 mg twice daily, spironolactone 25 mg daily and the increasing valsartan to 160 mg daily (did not happen last visit).   Continuing .  Furosemide 80 mg daily with additional doses PRN.   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  The original plan was to have this removed, but she reconsidered after talking with her PCP.  Not sure how long the filter has been in place, may be difficult to remove.  Would also be concerned about possible follow-up embolism.      Relevant Orders   EKG 12-Lead (Completed)   Lymphedema (Chronic)    She actually still has her lymphedema pump.  Per vascular surgery, recommend conservative therapy.  She does much better when the Unna boot is in place, but if symptoms Unaboot comes off, the swelling grows.  May need to consider Ace wraps when Unaboot comes off. Continue wound care.       ===================================  HPI:    [name] is a 50 y.o. female with a PMH Notable for CHRONIC BILATERAL LOWER EXTREMITY VENOUS STASIS (worse in left leg slow healing left lower extremity ulcer), s/p LLE DVT s/p IVC filter-on DOAC), resistant/typical HTN, HLD, diagnosis of HFpEF (was dyspneic during PRBC  infusion August 2021 and then recently in July 2022)) who presents today for 84-monthfollow-up.  Megan DKoriana Stepienwas last seen for this consultation on June 30, 2020.  [facility]but unable to get any records from the hospital).  She had also been seen [facility]ucky Cowboy[facility] who recommended continued with elevation compression stocks as well as wound care.  Felt like she had possibly symptoms lymphedema.  Suggested possibly removing IVC filter, but suggested that vein ablation would not be helpful..       ==> When I saw her, her edema was improving and the wound was healing.  She was taking daily furosemide along with spironolactone.  Energy was better.  Had hip and knee pain.  Also legs feeling weak.  Mild memory loss and anxiety. -> She had recently had Diovan 80 mg ordered.  I increased up to 160 mg and discontinued Norvasc (however it still remains on her list).  Recent Hospitalizations:  7/27-11/27/2020: Admitted for worsening dyspnea, hypoxia with EF of 58%.  Febrile to 103 F.  Apparently, the symptoms began shortly after having a flush of her Port-A-Cath.  She had chest pain and dyspnea.  Pain was made worse by palpation.  UA was negative for UTI.  Chest x-ray negative for infiltrates.  Treated with IV antibiotics empirically.  Cefepime discontinued after day 3. => There is concern for possible CHF and she was treated with IV Lasix.  She was also treated with IV Solu-Medrol. Chest CT showed marked distention of the azygos vein and vascular redistribution that can be seen in the setting of right ventricular overload.  The PA was also enlarged which could potentially reflect PA hypertension. => PE protocol CT was negative. Cardiology consult at that time indicated that she felt like her history of cardiac disease dating back to 2007 when she had a "heart attack in the setting of due to massive bleeding from a wound ".  Never had a  stress test or cardiac cath that she can remember. ->  SGLT2 inhibitor added.  Amlodipine was still present -> diuresed about 4.3 L.  Continued 80 mg Lasix on discharge.  Chest pain is not felt to be anginal.  Reviewed  CV studies:    The following studies were reviewed today: (if available, images/films reviewed: From Epic Chart or Care Everywhere) Transthoracic Echocardiogram 11/25/2020: EF 55 to 60%.  Normal LV function.  No RWM A.  Mild LVH.  GR 1 DD.  Normal PAP.  Normal aortic and mitral valve.  Mildly elevated RAP  ~8 mmHg. Lower Extremity Venous Reflux Dopplers 06/23/2020: No evidence of DVT or superficial thrombosis bilaterally.  No evidence of deep venous insufficiency bilaterally.  No evidence of SSV reflux.  Right GSV in the calf has reflux, no reflux in L GSV. Repeat lower extremity venous duplex 11/23/2020: No DVT or cystic structure found in the popliteal fossa.  Interval  History:   [name] returns here today for follow-up overall feeling like she is doing okay.  She has been relatively stable with no further fevers or significant dyspnea.  Edema is pretty stable and she can have adjust her diuretic according to the swelling.  She has off-and-on episodes of chest pain which seem to be associated with where her monitor is.  She had discussed with vascular surgeon about potentially removing her IVC filter, but her PCP suggested that she does not take it out because of her history of recurrent DVT having PE especially with the dilated azygous vein and PA.  There is concern for possible chronic PE.  She remains on Eliquis with no major bleeding issues.  Her blood pressures have been doing relatively well, but still little bit high today.  She has exertional dyspnea at baseline, but not more than usual.  She still has left greater than right edema that she controls pretty much with her current diuretic dosing and occasional takes additional doses.  What she says is that whenever  she wears the Unna boot, the swelling goes down nicely, and the wound starts to heal pretty well, but then his symptoms Unaboot comes off, the swelling recurs and she says that the wound opens back up again.  Need to think of a longer term plan for this.  CV Review of Symptoms (Summary) Cardiovascular ROS: positive for - dyspnea on exertion, edema, palpitations, and musculoskeletal chest pains.  Exertional dyspnea stable as he is edema.  (Left >right leg. negative for - chest pain, irregular heartbeat, orthopnea, palpitations, paroxysmal nocturnal dyspnea, rapid heart rate, shortness of breath, or lightheadedness or dizziness, syncope/near syncope or TIA/amaurosis fugax, claudication  REVIEWED OF SYSTEMS   Review of Systems  Constitutional:  Positive for malaise/fatigue (Does not have a lot of energy, but is getting better). Negative for chills and fever (None since her hospitalization).  HENT:  Positive for congestion. Negative for nosebleeds.   Respiratory:  Positive for cough (Off and on.) and shortness of breath (Pretty much at her baseline).   Gastrointestinal:  Positive for constipation (Intermittent constipation). Negative for blood in stool and melena.  Genitourinary:  Negative for dysuria (Not recently) and hematuria.  Musculoskeletal:  Positive for back pain and joint pain (Knees hips and ankles).  Neurological:  Positive for dizziness (Occasionally when she stands up quickly) and weakness (Legs do get weak with numbing and tingling-left mostly.). Negative for focal weakness.  Psychiatric/Behavioral:  Positive for memory loss. Negative for depression. The patient is nervous/anxious. The patient does not have insomnia.    I have reviewed and (if needed) personally updated the patient's problem list, medications, allergies, past medical and surgical history, social and family history.   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   Morbid obesity due to excess calories ([place])    Osteoarthritis of left knee    Prediabetes    Small bowel obstruction ([place])    as a child   Speech impediment    Stutter / stammer    PAST SURGICAL HISTORY   Past Surgical History:  Procedure Laterality Date   ABDOMINAL WALL DEFECT REPAIR  1970   IR CV LINE INJECTION  10/24/2020   IVC FILTER INSERTION  2017   [place]  2014    Immunization History  Administered Date(s) Administered   Influenza,inj,Quad PF,6+ Mos 06/16/2020   Tdap 06/16/2020   Zoster Recombinat (Shingrix) 04/07/2020    MEDICATIONS/ALLERGIES   Current Meds  Medication Sig   Accu-Chek Softclix Lancets lancets Use as instructed   acetaminophen (TYLENOL) 500 MG tablet Take 1 tablet (500 mg total) by mouth every 6 (six) hours as needed.   albuterol (PROAIR HFA) 108 (90 Base) MCG/ACT inhaler INHALE TWO PUFFS BY MOUTH EVERY 6 HOURS AS NEEDED FOR WHEEZING OR SHORTNESS OF BREATH   amLODipine (NORVASC) 5 MG tablet Take 0.5 tablets (2.5 mg total) by mouth daily.   apixaban (ELIQUIS) 5 MG TABS tablet Take 1 tablet (5 mg total) by mouth 2 (two) times daily.   atorvastatin (LIPITOR) 40 MG tablet Take 1 tablet (40 mg total) by mouth daily.   Blood Glucose  Calibration (ACCU-CHEK GUIDE CONTROL) LIQD 1 each by In Vitro route daily.   Blood Glucose Monitoring Suppl (ACCU-CHEK GUIDE) w/Device KIT 1 each by Does not apply route 2 (two) times daily.   Carboxymethylcellulose Sodium (EYE DROPS OP) Place 1 drop into both eyes daily as needed (dry eyes).   carvedilol (COREG) 25 MG tablet TAKE ONE TABLET BY MOUTH TWICE A DAY WITH MEALS   cholecalciferol (VITAMIN D3) 25 MCG (1000 UNIT) tablet Take 1 tablet (1,000 Units total) by mouth daily.   dapagliflozin propanediol (FARXIGA) 10 MG TABS tablet Take 1 tablet (10 mg total) by mouth daily.   doxycycline (VIBRA-TABS) 100 MG tablet Take 1 tablet (100 mg total) by mouth 2 (two) times daily.   furosemide (LASIX) 80 MG tablet Take 1 tablet (80 mg total) by mouth daily.   gabapentin (NEURONTIN) 100 MG capsule Take 1 capsule (100 mg total) by mouth 3 (three) times daily as needed (pain).   glucose blood test strip Use as instructed   hydrocortisone cream 1 % Apply 1 application topically daily as needed for itching.   KLOR-CON M20 20 MEQ tablet TAKE ONE TABLET BY MOUTH DAILY   methocarbamol (ROBAXIN) 500 MG tablet TAKE ONE TABLET BY MOUTH DAILY AS NEEDED FOR MUSCLE SPASMS   mometasone-formoterol (DULERA) 200-5 MCG/ACT AERO Inhale 2 puffs into the lungs 2 (two) times daily.   nitroGLYCERIN (NITROSTAT) 0.4 MG SL tablet Place 1 tablet (0.4 mg total) under the tongue every 5 (five) minutes as needed for chest pain.   omeprazole (PRILOSEC) 20 MG capsule Take 20 mg by mouth daily as needed (acid reflux).   PARoxetine (PAXIL) 10 MG tablet TAKE ONE TABLET BY MOUTH DAILY   spironolactone (ALDACTONE) 25 MG tablet Take 1 tablet (25 mg total) by mouth daily.   traMADol (ULTRAM) 50 MG tablet Take 1 tablet (50 mg total) by mouth every 6 (six) hours as needed for moderate pain.   valsartan (DIOVAN) 80 MG tablet Take  1 tablet (80 mg total) by mouth daily.   vitamin B-12 (CYANOCOBALAMIN) 1000 MCG tablet Take 1,000 mcg by mouth daily.     Allergies  Allergen Reactions   Ace Inhibitors Rash and Other (See Comments)    Make pt bleed   Aspirin Other (See Comments)    Per patient paperwork: blood clot?  Likely because of chronic DOAC   Hydromorphone Hives and Itching   Vancomycin Itching and Rash   Contrast Media [Iodinated Diagnostic Agents] Hives   Dilaudid [Hydromorphone Hcl] Hives    SOCIAL HISTORY/FAMILY HISTORY   Reviewed in Epic:  Pertinent findings:  Social History   Tobacco Use   Smoking status: Former   Smokeless tobacco: Never  Scientific laboratory technician Use: Never used  Substance Use Topics   Alcohol use: Not Currently   Drug use: Not Currently   Social History   Social History Narrative   Not on file    OBJCTIVE -PE, EKG, labs   Wt Readings from Last 3 Encounters:  02/14/21 (!) 350 lb 9.6 oz (159 kg)  01/02/21 (!) 349 lb (158.3 kg)  12/29/20 (!) 350 lb (158.8 kg)    Physical Exam: BP (!) 145/86   Pulse 74   Ht 5' 2"  (1.575 m)   Wt (!) 350 lb 9.6 oz (159 kg)   SpO2 97%   BMI 64.13 kg/m  Physical Exam Vitals reviewed.  Constitutional:      Appearance: She is ill-appearing (Somewhat chronically ill-appearing.  Nontoxic).     Comments: Super morbidly obese.  HENT:     Head: Normocephalic and atraumatic.  Neck:     Vascular: No carotid bruit or JVD (Unable to assess JVD due to body habitus).  Cardiovascular:     Rate and Rhythm: Normal rate and regular rhythm. No extrasystoles are present.    Chest Wall: PMI is not displaced (Unable to assess).     Pulses: Intact distal pulses. Decreased pulses (Diminished pedal pulses due to body habitus and chronic swelling.  Able to palpate right better than left.).     Heart sounds: S1 normal and S2 normal. Heart sounds are distant. No murmur heard.   No friction rub. No gallop.  Pulmonary:     Effort: Pulmonary effort is normal.     Comments: Distant but mostly clear breath sounds.  No wheezes, rales or rhonchi. Musculoskeletal:         General: Swelling present.     Cervical back: Normal range of motion and neck supple.     Right lower leg: Edema (1+ to mid calf) present.     Left lower leg: Edema (2-3+ mid calf) present.  Neurological:     General: No focal deficit present.     Mental Status: She is alert and oriented to person, place, and time.     Gait: Gait normal.  Psychiatric:        Mood and Affect: Mood normal.        Behavior: Behavior normal.        Thought Content: Thought content normal.        Judgment: Judgment normal.     Comments: Poor historian.  Poor insight.  Stammering/stuttering speech    Adult ECG Report  Rate: 78 ;  Rhythm: normal sinus rhythm and left atrial enlargement, nonspecific ST and T wave changes. ; Otherwise normal axis, intervals and durations.  Narrative Interpretation: Stable EKG.  Recent Labs: N/A No results found for: CHOL, HDL, LDLCALC, LDLDIRECT, TRIG,  CHOLHDL Lab Results  Component Value Date   CREATININE 0.77 11/29/2020   BUN 12 11/29/2020   NA 138 11/29/2020   K 4.6 11/29/2020   CL 96 11/29/2020   CO2 29 11/29/2020   CBC Latest Ref Rng & Units 11/29/2020 11/25/2020 11/23/2020  WBC 3.4 - 10.8 x10E3/uL 8.6 11.1(H) 9.5  Hemoglobin 11.1 - 15.9 g/dL 10.9(L) 10.4(L) 10.8(L)  Hematocrit 34.0 - 46.6 % 36.1 34.1(L) 36.2  Platelets 150 - 450 x10E3/uL 165 160 129(L)    Lab Results  Component Value Date   HGBA1C 6.5 (H) 11/24/2020   No results found for: TSH  ==================================================  COVID-19 Education: The signs and symptoms of COVID-19 were discussed with the patient and how to seek care for testing (follow up with PCP or arrange E-visit).    I spent a total of 28 minutes with the patient spent in direct patient consultation.  Additional time spent with chart review  / charting (studies, outside notes, etc): 25 min Total Time: 53 min  Current medicines are reviewed at length with the patient today.  (+/- concerns) N/A  This visit occurred  during the SARS-CoV-2 public health emergency.  Safety protocols were in place, including screening questions prior to the visit, additional usage of staff PPE, and extensive cleaning of exam room while observing appropriate contact time as indicated for disinfecting solutions.  Notice: This dictation was prepared with Dragon dictation along with smart phrase technology. Any transcriptional errors that result from this process are unintentional and may not be corrected upon review.  Patient Instructions / Medication Changes & Studies & Tests Ordered   Patient Instructions  Medication Instructions:   Increase Valsartan 160 mg  one tablet  daily  *If you need a refill on your cardiac medications before your next appointment, please call your pharmacy*   Lab Work: Not needed   Testing/Procedures: Not needed   Follow-Up: At [facility], you and your health needs are our priority.  As part of our continuing mission to provide you with exceptional heart care, we have created designated Provider Care Teams.  These Care Teams include your primary Cardiologist (physician) and Advanced Practice Providers (APPs -  Physician Assistants and Nurse Practitioners) who all work together to provide you with the care you need, when you need it.     Your next appointment:   5-6 month(s)  The format for your next appointment:   In Person  Provider:   Glenetta Hew, MD   Other Instructions    Studies Ordered:   Orders Placed This Encounter  Procedures   EKG 12-Lead     Glenetta Hew, M.D., M.S. Interventional Cardiologist   Pager # [phone] Phone # [phone] [address] [place], [place] [address]   Thank you for choosing [facility] at [facility]!!

## 2021-02-15 ENCOUNTER — Encounter (HOSPITAL_BASED_OUTPATIENT_CLINIC_OR_DEPARTMENT_OTHER): Payer: Medicaid Other | Attending: Internal Medicine | Admitting: Internal Medicine

## 2021-02-15 DIAGNOSIS — Z86718 Personal history of other venous thrombosis and embolism: Secondary | ICD-10-CM | POA: Insufficient documentation

## 2021-02-15 DIAGNOSIS — I2510 Atherosclerotic heart disease of native coronary artery without angina pectoris: Secondary | ICD-10-CM | POA: Diagnosis not present

## 2021-02-15 DIAGNOSIS — L97829 Non-pressure chronic ulcer of other part of left lower leg with unspecified severity: Secondary | ICD-10-CM | POA: Insufficient documentation

## 2021-02-15 DIAGNOSIS — I872 Venous insufficiency (chronic) (peripheral): Secondary | ICD-10-CM | POA: Diagnosis not present

## 2021-02-15 DIAGNOSIS — I5032 Chronic diastolic (congestive) heart failure: Secondary | ICD-10-CM | POA: Insufficient documentation

## 2021-02-15 DIAGNOSIS — Z87891 Personal history of nicotine dependence: Secondary | ICD-10-CM | POA: Insufficient documentation

## 2021-02-15 DIAGNOSIS — I110 Hypertensive heart disease with heart failure: Secondary | ICD-10-CM | POA: Diagnosis not present

## 2021-02-15 DIAGNOSIS — G4730 Sleep apnea, unspecified: Secondary | ICD-10-CM | POA: Insufficient documentation

## 2021-02-15 DIAGNOSIS — I890 Lymphedema, not elsewhere classified: Secondary | ICD-10-CM | POA: Diagnosis not present

## 2021-02-18 ENCOUNTER — Encounter: Payer: Self-pay | Admitting: Cardiology

## 2021-02-18 NOTE — Assessment & Plan Note (Signed)
Continue long-term anticoagulation.  The original plan was to have this removed, but she reconsidered after talking with her PCP.  Not sure how long the filter has been in place, may be difficult to remove.  Would also be concerned about possible follow-up embolism.

## 2021-02-18 NOTE — Assessment & Plan Note (Signed)
Interestingly, her Dopplers did not show significant venous insufficiency.  She clearly has blood phlebitic changes worse on the left side than the right.  She still has IVC paternal place.  I do sort of agree with not trying to take it out has been in place for long time and rest may be great.  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  Continue compression stockings, foot elevation and diuretics.  We talked about appropriate foot elevation techniques.  Talked about sliding scale Lasix dosing-can consider metolazone for exacerbations if necessary.

## 2021-02-18 NOTE — Assessment & Plan Note (Addendum)
Only grade 1 diastolic function noted on echo.  She may very well have pulmonary hypertension possibly related to chronic PE or even potentially OSA.  The plan was for her to have a sleep study done, and I recommend that this continues to be followed up on.  Certainly, if there are any more episodes, we could consider right heart catheterization to better assess.   She is on carvedilol 25 mg twice daily, spironolactone 25 mg daily and the increasing valsartan to 160 mg daily (did not happen last visit).    Continuing .  Furosemide 80 mg daily with additional doses PRN.    Also on Farxiga

## 2021-02-18 NOTE — Assessment & Plan Note (Signed)
Blood pressure looks better today, but still not at goal.  She remains on Norvasc which we will continue for now but may potentially increase if not able to get control with current meds.  Plan for now is to increase valsartan back to 160 mg which had done the last visit.  Unfortunate this did not happen.   Continue high-dose carvedilol along with spironolactone 25 mg.

## 2021-02-18 NOTE — Assessment & Plan Note (Signed)
She actually still has her lymphedema pump.  Per vascular surgery, recommend conservative therapy.  She does much better when the Unna boot is in place, but if symptoms Unaboot comes off, the swelling grows.  May need to consider Ace wraps when Unaboot comes off. Continue wound care.

## 2021-02-18 NOTE — Assessment & Plan Note (Signed)
Postherpetic symptoms and lymphedema noted.  Unfortunately the Dopplers did not really show much in the way of venous insufficiency.  Continue compression stockings, foot elevation and most importantly Eliquis.  She remains on 80 mg daily Lasix along with spironolactone.

## 2021-02-19 ENCOUNTER — Other Ambulatory Visit: Payer: Self-pay

## 2021-02-19 ENCOUNTER — Encounter (HOSPITAL_BASED_OUTPATIENT_CLINIC_OR_DEPARTMENT_OTHER): Payer: Medicaid Other | Admitting: Internal Medicine

## 2021-02-19 DIAGNOSIS — L97829 Non-pressure chronic ulcer of other part of left lower leg with unspecified severity: Secondary | ICD-10-CM | POA: Diagnosis not present

## 2021-02-19 DIAGNOSIS — I872 Venous insufficiency (chronic) (peripheral): Secondary | ICD-10-CM

## 2021-02-19 DIAGNOSIS — I890 Lymphedema, not elsewhere classified: Secondary | ICD-10-CM | POA: Diagnosis not present

## 2021-02-19 DIAGNOSIS — Z86718 Personal history of other venous thrombosis and embolism: Secondary | ICD-10-CM

## 2021-02-19 NOTE — Progress Notes (Signed)
VANDY, FONG ([id number]) Visit Report for 02/19/2021 Chief Complaint Document Details Patient Name: Date of Service: [name], [name] 02/19/2021 9:45 A M Medical Record Number: [id number] Patient Account Number: [id number] Date of Birth/Sex: Treating RN: 1970/05/11 (50 y.o. [name] Primary Care Provider: [name] Other Clinician: Referring Provider: Treating Provider/Extender: Sammuel Bailiff in Treatment: 17 Information Obtained from: Patient Chief Complaint Left lower extremity wound Electronic Signature(s) Signed: 02/19/2021 12:06:00 PM By: [name] DO Entered By: [name] on 02/19/2021 12:01:26 -------------------------------------------------------------------------------- HPI Details Patient Name: Date of Service: [name], [name] 02/19/2021 9:45 A M Medical Record Number: [id number] Patient Account Number: [id number] Date of Birth/Sex: Treating RN: [birth year] (50 y.o. [name] Primary Care Provider: [name] Other Clinician: Referring Provider: Treating Provider/Extender: Sammuel Bailiff in Treatment: 17 History of Present Illness HPI Description: Admission 6/24 [name] is a 50 year old female with a past medical history of chronic venous insufficiency, lymphedema, DVT on anticoagulation and diastolic heart failure that presents to the clinic for left lower extremity wound. She was last seen 4 months ago in our clinic for the same issue. The reoccurring wound started at the end of May and has been going on for 1 month. She has been using an ointment and I am unclear what this is. She tries to keep her leg elevated with her compression stocking. She also reports she has lymphedema pumps and has been using them as well. She reports mild pain to the area. She denies signs of infection including increased warmth, erythema or purulent drainage. 7/1; patient  presents for 1 week follow-up. She has tolerated the wrap well. Unfortunately she did have trouble with the wrap Sliding down her leg 2 days ago. She has no issues or complaints today. She denies signs of infection. 7/15; patient presents for follow-up. She has tolerated the wrap well. She has no issues or complaints today. She denies signs of infection. 7/25; patient presents for 1 week follow-up. She has no issues or complaints today. She denies signs of infection. 8/4; patient presents for follow-up. She has tolerated the compression wrap well to her left lower extremity. She states she was in the hospital last week due to fluid overload. She subsequently developed blisters to her bottom from being in the hospital bed for prolonged periods of time. These have since ruptured. She now has 2 areas of skin breakdown. She has been keeping gauze on them. She denies signs of infection. 8/18; patient presents for 2-week follow-up. She reports improvement to her buttocks wounds. She is able to tolerate the wrap well to her left lower extremity with no issues. She denies signs of infection. 9/1; patient presents for 2-week follow-up. She has no issues or complaints today. she denies signs of infection. 9/15; patient presents for 2-week follow-up. She has no issues or complaints today. She has tolerated the wrap well. She denies signs of infection. 9/27; patient presents for 1 week follow-up. She has no issues or complaints today. 10/20; patient presents for follow-up. She was on vacation for the past 3 weeks. She states she had the last clinic visit wrap in place for 1 week and then she took this off. She states that she has been using her compression wraps since. She states that the wound has gotten larger. She reports pain to the wound site. She denies fever/chills, nausea/vomiting. She denies purulent drainage. 10/24; patient presents for follow-up. She states she tolerated the Kerlix/Coban  wrap well. She  reports decrease in pain. She states she started Keflex. She denies systemic signs of infection. Electronic Signature(s) Signed: 02/19/2021 12:06:00 PM By: [name] DO Entered By: [name] on 02/19/2021 12:02:00 -------------------------------------------------------------------------------- Physical Exam Details Patient Name: Date of Service: Megan Session D. 02/19/2021 9:45 A M Medical Record Number: [id number] Patient Account Number: [id number] Date of Birth/Sex: Treating RN: [birth year] (50 y.o. [name] Primary Care Provider: [name] Other Clinician: Referring Provider: Treating Provider/Extender: Sammuel Bailiff in Treatment: 17 Constitutional respirations regular, non-labored and within target range for patient.. Cardiovascular 2+ dorsalis pedis/posterior tibialis pulses. Psychiatric pleasant and cooperative. Notes Left lower extremity: On the medial aspect there is an open wound with granulation tissue present. No signs of infection. No tenderness to the wound bed. Electronic Signature(s) Signed: 02/19/2021 12:06:00 PM By: [name] DO Entered By: [name] on 02/19/2021 12:02:26 -------------------------------------------------------------------------------- Physician Orders Details Patient Name: Date of Service: Megan Session D. 02/19/2021 9:45 A M Medical Record Number: [id number] Patient Account Number: [id number] Date of Birth/Sex: Treating RN: 17-Oct-1970 (50 y.o. [name] Primary Care Provider: [name] Other Clinician: Referring Provider: Treating Provider/Extender: Sammuel Bailiff in Treatment: [phone] Verbal / Phone Orders: No Diagnosis Coding ICD-10 Coding Code Description [phone] Non-pressure chronic ulcer of other part of left lower leg with unspecified severity I87.2 Venous insufficiency (chronic) (peripheral) I89.0 Lymphedema, not elsewhere  classified Z86.718 Personal history of other venous thrombosis and embolism Follow-up Appointments ppointment in 1 week. - Friday 10/28 Dr. Heber [place] Return A Other: - Stop Keflex, new antibiotic prescription sent to your pharmacy Bathing/ Shower/ Hygiene May shower with protection but do not get wound dressing(s) wet. - use cast protector to keep wrap dry in the shower Edema Control - Lymphedema / SCD / Other Bilateral Lower Extremities Lymphedema Pumps. Use Lymphedema pumps on leg(s) 2-3 times a day for 45-60 minutes. If wearing any wraps or hose, do not remove them. Continue exercising as instructed. Elevate legs to the level of the heart or above for 30 minutes daily and/or when sitting, a frequency of: Avoid standing for long periods of time. Patient to wear own compression stockings every day. - right leg daily Exercise regularly Off-Loading Turn and reposition every 2 hours - Do not apply pressure to these areas. Wound Treatment Wound #4 - Lower Leg Wound Laterality: Left, Medial Cleanser: Soap and Water 1 x Per Week/15 Days Discharge Instructions: May shower and wash wound with dial antibacterial soap and water prior to dressing change. Peri-Wound Care: Sween Lotion (Moisturizing lotion) 1 x Per Week/15 Days Discharge Instructions: Apply moisturizing lotion as directed Topical: Gentamicin 1 x Per Week/15 Days Discharge Instructions: As directed by physician Prim Dressing: Hydrofera Blue Classic Foam, 2x2 in 1 x Per Week/15 Days ary Discharge Instructions: Moisten with saline prior to applying to wound bed Secondary Dressing: Woven Gauze Sponge, Non-Sterile 4x4 in 1 x Per Week/15 Days Discharge Instructions: Apply over primary dressing as directed. Compression Wrap: ThreePress (3 layer compression wrap) 1 x Per Week/15 Days Discharge Instructions: Apply three layer compression as directed. Compression Wrap: Unnaboot w/Calamine, 4x10 (in/yd) 1 x Per Week/15 Days Discharge  Instructions: Apply at top to anchor wrap Patient Medications llergies: ACE Inhibitors, hydromorphone, vancomycin, ferrous sulfate A Notifications Medication Indication Start End 02/19/2021 amoxicillin DOSE 1 - oral 875 mg tablet - 1 tablet oral BID x 5 days Electronic Signature(s) Signed: 02/19/2021 12:06:00 PM By: [name] DO Previous Signature: 02/19/2021  10:03:20 AM Version By: [name] DO Previous Signature: 02/19/2021 9:47:20 AM Version By: [name] Entered By: [name] on 02/19/2021 12:02:51 -------------------------------------------------------------------------------- Problem List Details Patient Name: Date of Service: Megan Session D. 02/19/2021 9:45 A M Medical Record Number: [id number] Patient Account Number: [id number] Date of Birth/Sex: Treating RN: 08-Oct-1970 (50 y.o. [name] Primary Care Provider: [name] Other Clinician: Referring Provider: Treating Provider/Extender: Sammuel Bailiff in Treatment: 17 Active Problems ICD-10 Encounter Code Description Active Date MDM Diagnosis L97.829 Non-pressure chronic ulcer of other part of left lower leg with unspecified 10/20/2020 No Yes severity I87.2 Venous insufficiency (chronic) (peripheral) 10/20/2020 No Yes I89.0 Lymphedema, not elsewhere classified 10/20/2020 No Yes Z86.718 Personal history of other venous thrombosis and embolism 10/20/2020 No Yes Inactive Problems Resolved Problems Electronic Signature(s) Signed: 02/19/2021 12:06:00 PM By: [name] DO Entered By: [name] on 02/19/2021 12:01:15 -------------------------------------------------------------------------------- Progress Note Details Patient Name: Date of Service: Megan Session D. 02/19/2021 9:45 A M Medical Record Number: [id number] Patient Account Number: [id number] Date of Birth/Sex: Treating RN: [birth year] (50 y.o. [name] Primary Care  Provider: [name] Other Clinician: Referring Provider: Treating Provider/Extender: Sammuel Bailiff in Treatment: 17 Subjective Chief Complaint Information obtained from Patient Left lower extremity wound History of Present Illness (HPI) Admission 6/24 [name] with a past medical history of chronic venous insufficiency, lymphedema, DVT on anticoagulation and diastolic heart failure that presents to the clinic for left lower extremity wound. She was last seen 4 months ago in our clinic for the same issue. The reoccurring wound started at the end of May and has been going on for 1 month. She has been using an ointment and I am unclear what this is. She tries to keep her leg elevated with her compression stocking. She also reports she has lymphedema pumps and has been using them as well. She reports mild pain to the area. She denies signs of infection including increased warmth, erythema or purulent drainage. 7/1; patient presents for 1 week follow-up. She has tolerated the wrap well. Unfortunately she did have trouble with the wrap Sliding down her leg 2 days ago. She has no issues or complaints today. She denies signs of infection. 7/15; patient presents for follow-up. She has tolerated the wrap well. She has no issues or complaints today. She denies signs of infection. 7/25; patient presents for 1 week follow-up. She has no issues or complaints today. She denies signs of infection. 8/4; patient presents for follow-up. She has tolerated the compression wrap well to her left lower extremity. She states she was in the hospital last week due to fluid overload. She subsequently developed blisters to her bottom from being in the hospital bed for prolonged periods of time. These have since ruptured. She now has 2 areas of skin breakdown. She has been keeping gauze on them. She denies signs of infection. 8/18; patient presents for  2-week follow-up. She reports improvement to her buttocks wounds. She is able to tolerate the wrap well to her left lower extremity with no issues. She denies signs of infection. 9/1; patient presents for 2-week follow-up. She has no issues or complaints today. she denies signs of infection. 9/15; patient presents for 2-week follow-up. She has no issues or complaints today. She has tolerated the wrap well. She denies signs of infection. 9/27; patient presents for 1 week follow-up. She has no issues or complaints today. 10/20; patient presents  for follow-up. She was on vacation for the past 3 weeks. She states she had the last clinic visit wrap in place for 1 week and then she took this off. She states that she has been using her compression wraps since. She states that the wound has gotten larger. She reports pain to the wound site. She denies fever/chills, nausea/vomiting. She denies purulent drainage. 10/24; patient presents for follow-up. She states she tolerated the Kerlix/Coban wrap well. She reports decrease in pain. She states she started Keflex. She denies systemic signs of infection. Patient History Information obtained from Patient. Family History Diabetes - Mother,Maternal Grandparents, Heart Disease - Paternal Grandparents, Hypertension - Mother,Maternal Grandparents, Lung Disease - Father,Paternal Grandparents, No family history of Cancer, Hereditary Spherocytosis, Kidney Disease, Seizures, Stroke, Thyroid Problems, Tuberculosis. Social History Former smoker, Marital Status - Single, Alcohol Use - Never, Drug Use - No History, Caffeine Use - Daily. Medical History Eyes Denies history of Cataracts, Glaucoma, Optic Neuritis Ear/Nose/Mouth/Throat Denies history of Chronic sinus problems/congestion, Middle ear problems Hematologic/Lymphatic Denies history of Anemia, Hemophilia, Human Immunodeficiency Virus, Lymphedema, Sickle Cell Disease Respiratory Patient has history of Sleep  Apnea Denies history of Aspiration, Asthma, Chronic Obstructive Pulmonary Disease (COPD), Pneumothorax, Tuberculosis Cardiovascular Patient has history of Congestive Heart Failure, Coronary Artery Disease, Hypertension Denies history of Angina, Arrhythmia, Deep Vein Thrombosis, Hypotension, Myocardial Infarction, Peripheral Arterial Disease, Peripheral Venous Disease, Phlebitis, Vasculitis Gastrointestinal Denies history of Cirrhosis , Colitis, Crohnoos, Hepatitis A, Hepatitis B, Hepatitis C Endocrine Denies history of Type I Diabetes, Type II Diabetes Genitourinary Denies history of End Stage Renal Disease Immunological Denies history of Lupus Erythematosus, Raynaudoos, Scleroderma Integumentary (Skin) Denies history of History of Burn Musculoskeletal Denies history of Gout, Rheumatoid Arthritis, Osteoarthritis, Osteomyelitis Neurologic Denies history of Dementia, Neuropathy, Quadriplegia, Paraplegia, Seizure Disorder Oncologic Denies history of Received Chemotherapy, Received Radiation Psychiatric Denies history of Anorexia/bulimia, Confinement Anxiety Objective Constitutional respirations regular, non-labored and within target range for patient.. Vitals Time Taken: 9:13 AM, Height: 67 in, Weight: 346 lbs, BMI: 54.2, Temperature: 98.1 F, Pulse: 74 bpm, Respiratory Rate: 20 breaths/min, Blood Pressure: 121/83 mmHg, Capillary Blood Glucose: 197 mg/dl. Cardiovascular 2+ dorsalis pedis/posterior tibialis pulses. Psychiatric pleasant and cooperative. General Notes: Left lower extremity: On the medial aspect there is an open wound with granulation tissue present. No signs of infection. No tenderness to the wound bed. Integumentary (Hair, Skin) Wound #4 status is Open. Original cause of wound was Gradually Appeared. The date acquired was: 09/26/2020. The wound has been in treatment 17 weeks. The wound is located on the Left,Medial Lower Leg. The wound measures 2.5cm length x 4cm  width x 0.1cm depth; 7.854cm^2 area and 0.785cm^3 volume. There is Fat Layer (Subcutaneous Tissue) exposed. There is no tunneling or undermining noted. There is a small amount of serosanguineous drainage noted. The wound margin is distinct with the outline attached to the wound base. There is large (67-100%) red granulation within the wound bed. There is no necrotic tissue within the wound bed. Assessment Active Problems ICD-10 Non-pressure chronic ulcer of other part of left lower leg with unspecified severity Venous insufficiency (chronic) (peripheral) Lymphedema, not elsewhere classified Personal history of other venous thrombosis and embolism Patient's wound has shown improvement in appearance since last clinic visit. Since she tolerated the Kerlix/Coban wrap well I will increase to 3 layer. She started taking Keflex. Her culture grew Enterococcus and I will switch this to amoxicillin. No signs of soft tissue infection on exam. We will also add gent under wrap with hydrofera  blue. Follow-up at the end of the week to reestablish her weekly routine since she consistently follows up on Fridays. Procedures Wound #4 Pre-procedure diagnosis of Wound #4 is a Venous Leg Ulcer located on the Left,Medial Lower Leg . There was a Three Layer Compression Therapy Procedure by [name], RN. Post procedure Diagnosis Wound #4: Same as Pre-Procedure Plan Follow-up Appointments: Return Appointment in 1 week. - Friday 10/28 Dr. Heber [place] Other: - Stop Keflex, new antibiotic prescription sent to your pharmacy Bathing/ Shower/ Hygiene: May shower with protection but do not get wound dressing(s) wet. - use cast protector to keep wrap dry in the shower Edema Control - Lymphedema / SCD / Other: Lymphedema Pumps. Use Lymphedema pumps on leg(s) 2-3 times a day for 45-60 minutes. If wearing any wraps or hose, do not remove them. Continue exercising as instructed. Elevate legs to the level of the heart or  above for 30 minutes daily and/or when sitting, a frequency of: Avoid standing for long periods of time. Patient to wear own compression stockings every day. - right leg daily Exercise regularly Off-Loading: Turn and reposition every 2 hours - Do not apply pressure to these areas. The following medication(s) was prescribed: amoxicillin oral 875 mg tablet 1 1 tablet oral BID x 5 days starting 02/19/2021 WOUND #4: - Lower Leg Wound Laterality: Left, Medial Cleanser: Soap and Water 1 x Per Week/15 Days Discharge Instructions: May shower and wash wound with dial antibacterial soap and water prior to dressing change. Peri-Wound Care: Sween Lotion (Moisturizing lotion) 1 x Per Week/15 Days Discharge Instructions: Apply moisturizing lotion as directed Topical: Gentamicin 1 x Per Week/15 Days Discharge Instructions: As directed by physician Prim Dressing: Hydrofera Blue Classic Foam, 2x2 in 1 x Per Week/15 Days ary Discharge Instructions: Moisten with saline prior to applying to wound bed Secondary Dressing: Woven Gauze Sponge, Non-Sterile 4x4 in 1 x Per Week/15 Days Discharge Instructions: Apply over primary dressing as directed. Com pression Wrap: ThreePress (3 layer compression wrap) 1 x Per Week/15 Days Discharge Instructions: Apply three layer compression as directed. Com pression Wrap: Unnaboot w/Calamine, 4x10 (in/yd) 1 x Per Week/15 Days Discharge Instructions: Apply at top to anchor wrap 1. Hydrofera Blue with gentamicin under 3 layer compression 2. Amoxicillin 3. Follow-up at the end of the week Electronic Signature(s) Signed: 02/19/2021 12:06:00 PM By: [name] DO Entered By: [name] on 02/19/2021 12:05:28 -------------------------------------------------------------------------------- HxROS Details Patient Name: Date of Service: [name], [name] 02/19/2021 9:45 A M Medical Record Number: [id number] Patient Account Number: [id number] Date of  Birth/Sex: Treating RN: Mar 01, 1971 (50 y.o. [name] Primary Care Provider: [name] Other Clinician: Referring Provider: Treating Provider/Extender: Sammuel Bailiff in Treatment: 17 Information Obtained From Patient Eyes Medical History: Negative for: Cataracts; Glaucoma; Optic Neuritis Ear/Nose/Mouth/Throat Medical History: Negative for: Chronic sinus problems/congestion; Middle ear problems Hematologic/Lymphatic Medical History: Negative for: Anemia; Hemophilia; Human Immunodeficiency Virus; Lymphedema; Sickle Cell Disease Respiratory Medical History: Positive for: Sleep Apnea Negative for: Aspiration; Asthma; Chronic Obstructive Pulmonary Disease (COPD); Pneumothorax; Tuberculosis Cardiovascular Medical History: Positive for: Congestive Heart Failure; Coronary Artery Disease; Hypertension Negative for: Angina; Arrhythmia; Deep Vein Thrombosis; Hypotension; Myocardial Infarction; Peripheral Arterial Disease; Peripheral Venous Disease; Phlebitis; Vasculitis Gastrointestinal Medical History: Negative for: Cirrhosis ; Colitis; Crohns; Hepatitis A; Hepatitis B; Hepatitis C Endocrine Medical History: Negative for: Type I Diabetes; Type II Diabetes Genitourinary Medical History: Negative for: End Stage Renal Disease Immunological Medical History: Negative for: Lupus Erythematosus; Raynauds; Scleroderma Integumentary (Skin) Medical  History: Negative for: History of Burn Musculoskeletal Medical History: Negative for: Gout; Rheumatoid Arthritis; Osteoarthritis; Osteomyelitis Neurologic Medical History: Negative for: Dementia; Neuropathy; Quadriplegia; Paraplegia; Seizure Disorder Oncologic Medical History: Negative for: Received Chemotherapy; Received Radiation Psychiatric Medical History: Negative for: Anorexia/bulimia; Confinement Anxiety Immunizations Pneumococcal Vaccine: Received Pneumococcal Vaccination: No Implantable  Devices None Family and Social History Cancer: No; Diabetes: Yes - Mother,Maternal Grandparents; Heart Disease: Yes - Paternal Grandparents; Hereditary Spherocytosis: No; Hypertension: Yes - Mother,Maternal Grandparents; Kidney Disease: No; Lung Disease: Yes - Father,Paternal Grandparents; Seizures: No; Stroke: No; Thyroid Problems: No; Tuberculosis: No; Former smoker; Marital Status - Single; Alcohol Use: Never; Drug Use: No History; Caffeine Use: Daily; Financial Concerns: No; Food, Clothing or Shelter Needs: No; Support System Lacking: No; Transportation Concerns: No Electronic Signature(s) Signed: 02/19/2021 12:06:00 PM By: [name] DO Signed: 02/19/2021 4:46:21 PM By: [name] Entered By: [name] on 02/19/2021 12:02:05 -------------------------------------------------------------------------------- SuperBill Details Patient Name: Date of Service: Megan Session D. 02/19/2021 Medical Record Number: [id number] Patient Account Number: [id number] Date of Birth/Sex: Treating RN: February 28, 1971 (50 y.o. [name] Primary Care Provider: [name] Other Clinician: Referring Provider: Treating Provider/Extender: Sammuel Bailiff in Treatment: 17 Diagnosis Coding ICD-10 Codes Code Description [phone] Non-pressure chronic ulcer of other part of left lower leg with unspecified severity I87.2 Venous insufficiency (chronic) (peripheral) I89.0 Lymphedema, not elsewhere classified Z86.718 Personal history of other venous thrombosis and embolism Facility Procedures CPT4 Code: [id number] Description: (Facility Use Only) [phone] - [facility] LWR LT LEG ICD-10 Diagnosis Description L97.829 Non-pressure chronic ulcer of other part of left lower leg with unspecified severity I89.0 Lymphedema, not elsewhere classified Modifier: Quantity: 1 Physician Procedures : CPT4 Code Description Modifier [id number] - WC PHYS LEVEL 3 - EST  PT ICD-10 Diagnosis Description L97.829 Non-pressure chronic ulcer of other part of left lower leg with unspecified severity I87.2 Venous insufficiency (chronic) (peripheral) I89.0  Lymphedema, not elsewhere classified Z86.718 Personal history of other venous thrombosis and embolism Quantity: 1 Electronic Signature(s) Signed: 02/19/2021 12:06:00 PM By: [name] DO Entered By: [name] on 02/19/2021 12:05:39

## 2021-02-19 NOTE — Progress Notes (Signed)
ALBERTINA, LEISE ([id number]) Visit Report for 02/19/2021 Arrival Information Details Patient Name: Date of Service: [name], [name] 02/19/2021 9:45 A M Medical Record Number: [id number] Patient Account Number: [id number] Date of Birth/Sex: Treating RN: Sep 10, 1970 (50 y.o. [name] Primary Care Glyn Zendejas: [name] Other Clinician: Referring Yeshua Stryker: Treating Daquon Greenleaf/Extender: Sammuel Bailiff in Treatment: 73 Visit Information History Since Last Visit Added or deleted any medications: No Patient Arrived: Ambulatory Any new allergies or adverse reactions: No Arrival Time: 09:11 Had a fall or experienced change in No Transfer Assistance: None activities of daily living that may affect Patient Identification Verified: Yes risk of falls: Secondary Verification Process Completed: Yes Signs or symptoms of abuse/neglect since last visito No Patient Requires Transmission-Based Precautions: No Hospitalized since last visit: No Patient Has Alerts: Yes Implantable device outside of the clinic excluding No Patient Alerts: ABI's: 11/21 L:1.17 cellular tissue based products placed in the center since last visit: Has Dressing in Place as Prescribed: Yes Has Compression in Place as Prescribed: Yes Pain Present Now: Yes Electronic Signature(s) Signed: 02/19/2021 4:46:21 PM By: [name] Entered By: [name] on 02/19/2021 09:12:12 -------------------------------------------------------------------------------- Compression Therapy Details Patient Name: Date of Service: Megan Session D. 02/19/2021 9:45 A M Medical Record Number: [id number] Patient Account Number: [id number] Date of Birth/Sex: Treating RN: March 27, 1971 (50 y.o. [name] Primary Care Elfreida Heggs: [name] Other Clinician: Referring Stephanine Reas: Treating Corah Willeford/Extender: Sammuel Bailiff in Treatment: 17 Compression Therapy  Performed for Wound Assessment: Wound #4 Left,Medial Lower Leg Performed By: Clinician [name], RN Compression Type: Three Layer Post Procedure Diagnosis Same as Pre-procedure Electronic Signature(s) Signed: 02/19/2021 4:46:21 PM By: [name] Entered By: [name] on 02/19/2021 09:57:56 -------------------------------------------------------------------------------- Encounter Discharge Information Details Patient Name: Date of Service: Megan Session D. 02/19/2021 9:45 A M Medical Record Number: [id number] Patient Account Number: [id number] Date of Birth/Sex: Treating RN: 1971/01/13 (50 y.o. [name] Primary Care Kaydon Husby: [name] Other Clinician: Referring Aarib Pulido: Treating Ashonte Angelucci/Extender: Sammuel Bailiff in Treatment: 17 Encounter Discharge Information Items Discharge Condition: Stable Ambulatory Status: Ambulatory Discharge Destination: Home Transportation: Private Auto Schedule Follow-up Appointment: Yes Clinical Summary of Care: Provided on 02/19/2021 Form Type Recipient Paper Patient Patient Electronic Signature(s) Signed: 02/19/2021 4:46:21 PM By: [name] Entered By: [name] on 02/19/2021 10:15:10 -------------------------------------------------------------------------------- Lower Extremity Assessment Details Patient Name: Date of Service: Megan Session D. 02/19/2021 9:45 A M Medical Record Number: [id number] Patient Account Number: [id number] Date of Birth/Sex: Treating RN: [birth year] (50 y.o. [name] Primary Care Brently Voorhis: [name] Other Clinician: Referring Orpheus Hayhurst: Treating Novice Vrba/Extender: Sammuel Bailiff in Treatment: 17 Edema Assessment Assessed: [name]: Yes] [name]: No] Edema: [Left: Ye] [Right: s] Calf Left: Right: Point of Measurement: 25 cm From Medial Instep 47.3 cm Ankle Left: Right: Point of Measurement: 9 cm From  Medial Instep 24.2 cm Vascular Assessment Pulses: Dorsalis Pedis Palpable: [Left:Yes] Electronic Signature(s) Signed: 02/19/2021 4:46:21 PM By: [name] Entered By: [name] on 02/19/2021 09:16:10 -------------------------------------------------------------------------------- Multi Wound Chart Details Patient Name: Date of Service: Megan Session D. 02/19/2021 9:45 A M Medical Record Number: [id number] Patient Account Number: [id number] Date of Birth/Sex: Treating RN: [birth year] (50 y.o. [name] Primary Care Lash Matulich: [name] Other Clinician: Referring Antonietta Lansdowne: Treating Othelia Riederer/Extender: Sammuel Bailiff in Treatment: 17 Vital Signs Height(in): 67 Capillary Blood Glucose(mg/dl): 197 Weight(lbs): 346 Pulse(bpm): 74 Body Mass Index(BMI): 54 Blood Pressure(mmHg): 121/83  Temperature(F): 98.1 Respiratory Rate(breaths/min): 20 Photos: [N/A:N/A] Left, Medial Lower Leg N/A N/A Wound Location: Gradually Appeared N/A N/A Wounding Event: Venous Leg Ulcer N/A N/A Primary Etiology: Sleep Apnea, Congestive Heart N/A N/A Comorbid History: Failure, Coronary Artery Disease, Hypertension 09/26/2020 N/A N/A Date Acquired: 17 N/A N/A Weeks of Treatment: Open N/A N/A Wound Status: 2.5x4x0.1 N/A N/A Measurements L x W x D (cm) 7.854 N/A N/A A (cm) : rea 0.785 N/A N/A Volume (cm) : 51.70% N/A N/A % Reduction in Area: 51.80% N/A N/A % Reduction in Volume: Full Thickness Without Exposed N/A N/A Classification: Support Structures Small N/A N/A Exudate Amount: Serosanguineous N/A N/A Exudate Type: red, brown N/A N/A Exudate Color: Distinct, outline attached N/A N/A Wound Margin: Large (67-100%) N/A N/A Granulation Amount: Red N/A N/A Granulation Quality: None Present (0%) N/A N/A Necrotic Amount: Fat Layer (Subcutaneous Tissue): Yes N/A N/A Exposed Structures: Fascia: No Tendon: No Muscle: No Joint:  No Bone: No Medium (34-66%) N/A N/A Epithelialization: Compression Therapy N/A N/A Procedures Performed: Treatment Notes Wound #4 (Lower Leg) Wound Laterality: Left, Medial Cleanser Soap and Water Discharge Instruction: May shower and wash wound with dial antibacterial soap and water prior to dressing change. Peri-Wound Care Sween Lotion (Moisturizing lotion) Discharge Instruction: Apply moisturizing lotion as directed Topical Gentamicin Discharge Instruction: As directed by physician Primary Dressing Hydrofera Blue Classic Foam, 2x2 in Discharge Instruction: Moisten with saline prior to applying to wound bed Secondary Dressing Woven Gauze Sponge, Non-Sterile 4x4 in Discharge Instruction: Apply over primary dressing as directed. Secured With Compression Wrap ThreePress (3 layer compression wrap) Discharge Instruction: Apply three layer compression as directed. Unnaboot w/Calamine, 4x10 (in/yd) Discharge Instruction: Apply at top to anchor wrap Compression Stockings Add-Ons Electronic Signature(s) Signed: 02/19/2021 12:06:00 PM By: [name] DO Signed: 02/19/2021 4:46:21 PM By: [name] Entered By: [name] on 02/19/2021 12:01:20 -------------------------------------------------------------------------------- [place] Details Patient Name: Date of Service: Megan Session D. 02/19/2021 9:45 A M Medical Record Number: [id number] Patient Account Number: [id number] Date of Birth/Sex: Treating RN: 1970/10/10 (50 y.o. [name] Primary Care Koy Lamp: [name] Other Clinician: Referring Sheree Lalla: Treating Margret Moat/Extender: Sammuel Bailiff in Treatment: 65 Multidisciplinary Care Plan reviewed with physician Active Inactive Venous Leg Ulcer Nursing Diagnoses: Actual venous Insuffiency (use after diagnosis is confirmed) Knowledge deficit related to disease process and management Potential  for venous Insuffiency (use before diagnosis confirmed) Goals: Patient will maintain optimal edema control Date Initiated: 10/20/2020 Target Resolution Date: 02/23/2021 Goal Status: Active Interventions: Assess peripheral edema status every visit. Compression as ordered Treatment Activities: Therapeutic compression applied : 10/20/2020 Notes: 12/28/20: Edema control ongoing. Wound/Skin Impairment Nursing Diagnoses: Impaired tissue integrity Knowledge deficit related to ulceration/compromised skin integrity Goals: Patient/caregiver will verbalize understanding of skin care regimen Date Initiated: 10/20/2020 Date Inactivated: 12/28/2020 Target Resolution Date: 12/29/2020 Goal Status: Met Ulcer/skin breakdown will have a volume reduction of 30% by week 4 Date Initiated: 10/20/2020 Date Inactivated: 12/14/2020 Target Resolution Date: 11/24/2020 Goal Status: Met Ulcer/skin breakdown will have a volume reduction of 80% by week 12 Date Initiated: 12/14/2020 Date Inactivated: 01/23/2021 Target Resolution Date: 01/18/2021 Goal Status: Met Ulcer/skin breakdown will heal within 14 weeks Date Initiated: 01/23/2021 Target Resolution Date: 02/23/2021 Goal Status: Active Interventions: Assess patient/caregiver ability to obtain necessary supplies Assess patient/caregiver ability to perform ulcer/skin care regimen upon admission and as needed Assess ulceration(s) every visit Provide education on ulcer and skin care Treatment Activities: Skin care regimen initiated : 10/20/2020 Topical wound management initiated :  10/20/2020 Notes: Electronic Signature(s) Signed: 02/19/2021 4:46:21 PM By: [name] Entered By: [name] on 02/19/2021 09:14:05 -------------------------------------------------------------------------------- Pain Assessment Details Patient Name: Date of Service: Megan Session D. 02/19/2021 9:45 A M Medical Record Number: [id number] Patient Account Number:  [id number] Date of Birth/Sex: Treating RN: Feb 11, 1971 (50 y.o. [name] Primary Care Maurita Havener: [name] Other Clinician: Referring Winton Offord: Treating Delroy Ordway/Extender: Sammuel Bailiff in Treatment: 17 Active Problems Location of Pain Severity and Description of Pain Patient Has Paino Yes Site Locations Pain Location: Pain in Ulcers With Dressing Change: Yes Duration of the Pain. Constant / Intermittento Intermittent Rate the pain. Current Pain Level: 9 Character of Pain Describe the Pain: Tender, Throbbing Pain Management and Medication Current Pain Management: Medication: Yes Cold Application: No Rest: Yes Massage: No Activity: No T.E.N.S.: No Heat Application: No Leg drop or elevation: No Is the Current Pain Management Adequate: Adequate How does your wound impact your activities of daily livingo Sleep: No Bathing: No Appetite: No Relationship With Others: No Bladder Continence: No Emotions: No Bowel Continence: No Work: No Toileting: No Drive: No Dressing: No Hobbies: No Electronic Signature(s) Signed: 02/19/2021 4:46:21 PM By: [name] Entered By: [name] on 02/19/2021 09:12:43 -------------------------------------------------------------------------------- Patient/Caregiver Education Details Patient Name: Date of Service: Megan Session D. 10/24/2022andnbsp9:45 A M Medical Record Number: [id number] Patient Account Number: [id number] Date of Birth/Gender: Treating RN: October 10, 1970 (50 y.o. [name] Primary Care Physician: [name] Other Clinician: Referring Physician: Treating Physician/Extender: Sammuel Bailiff in Treatment: 17 Education Assessment Education Provided To: Patient Education Topics Provided Venous: Methods: Explain/Verbal, Printed Responses: State content correctly Wound/Skin Impairment: Methods: Explain/Verbal, Printed Responses:  State content correctly Electronic Signature(s) Signed: 02/19/2021 4:46:21 PM By: [name] Entered By: [name] on 02/19/2021 09:14:23 -------------------------------------------------------------------------------- Wound Assessment Details Patient Name: Date of Service: Megan Session D. 02/19/2021 9:45 A M Medical Record Number: [id number] Patient Account Number: [id number] Date of Birth/Sex: Treating RN: [birth year] (50 y.o. [name] Primary Care Ludwin Flahive: [name] Other Clinician: Referring Bairon Klemann: Treating Xiara Knisley/Extender: Sammuel Bailiff in Treatment: 17 Wound Status Wound Number: 4 Primary Venous Leg Ulcer Etiology: Wound Location: Left, Medial Lower Leg Wound Open Wounding Event: Gradually Appeared Status: Date Acquired: 09/26/2020 Comorbid Sleep Apnea, Congestive Heart Failure, Coronary Artery Weeks Of Treatment: 17 History: Disease, Hypertension Clustered Wound: No Photos Wound Measurements Length: (cm) 2.5 Width: (cm) 4 Depth: (cm) 0.1 Area: (cm) 7.854 Volume: (cm) 0.785 % Reduction in Area: 51.7% % Reduction in Volume: 51.8% Epithelialization: Medium (34-66%) Tunneling: No Undermining: No Wound Description Classification: Full Thickness Without Exposed Support Structures Wound Margin: Distinct, outline attached Exudate Amount: Small Exudate Type: Serosanguineous Exudate Color: red, brown Foul Odor After Cleansing: No Slough/Fibrino No Wound Bed Granulation Amount: Large (67-100%) Exposed Structure Granulation Quality: Red Fascia Exposed: No Necrotic Amount: None Present (0%) Fat Layer (Subcutaneous Tissue) Exposed: Yes Tendon Exposed: No Muscle Exposed: No Joint Exposed: No Bone Exposed: No Treatment Notes Wound #4 (Lower Leg) Wound Laterality: Left, Medial Cleanser Soap and Water Discharge Instruction: May shower and wash wound with dial antibacterial soap and water prior to  dressing change. Peri-Wound Care Sween Lotion (Moisturizing lotion) Discharge Instruction: Apply moisturizing lotion as directed Topical Gentamicin Discharge Instruction: As directed by physician Primary Dressing Hydrofera Blue Classic Foam, 2x2 in Discharge Instruction: Moisten with saline prior to applying to wound bed Secondary Dressing Woven Gauze Sponge, Non-Sterile 4x4 in Discharge Instruction: Apply over primary dressing as  directed. Secured With Compression Wrap ThreePress (3 layer compression wrap) Discharge Instruction: Apply three layer compression as directed. Unnaboot w/Calamine, 4x10 (in/yd) Discharge Instruction: Apply at top to anchor wrap Compression Stockings Add-Ons Electronic Signature(s) Signed: 02/19/2021 4:46:21 PM By: [name] Entered By: [name] on 02/19/2021 09:18:50 -------------------------------------------------------------------------------- Vitals Details Patient Name: Date of Service: [name], [name] 02/19/2021 9:45 A M Medical Record Number: [id number] Patient Account Number: [id number] Date of Birth/Sex: Treating RN: 25-Sep-1970 (50 y.o. [name] Primary Care Clevester Helzer: [name] Other Clinician: Referring Darianne Muralles: Treating Jahzion Brogden/Extender: Sammuel Bailiff in Treatment: 17 Vital Signs Time Taken: 09:13 Temperature (F): 98.1 Height (in): 67 Pulse (bpm): 74 Weight (lbs): 346 Respiratory Rate (breaths/min): 20 Body Mass Index (BMI): 54.2 Blood Pressure (mmHg): 121/83 Capillary Blood Glucose (mg/dl): 197 Reference Range: 80 - 120 mg / dl Electronic Signature(s) Signed: 02/19/2021 4:46:21 PM By: [name] Entered By: [name] on 02/19/2021 09:13:26

## 2021-02-20 NOTE — Progress Notes (Signed)
KIYLA, RINGLER ([id number]) Visit Report for 02/15/2021 Arrival Information Details Patient Name: Date of Service: ALANDRIA, BUTKIEWICZ 02/15/2021 1:45 PM Medical Record Number: [id number] Patient Account Number: [id number] Date of Birth/Sex: Treating RN: 1971/04/11 (50 y.o. [name], [name] Primary Care Marks Scalera: [name] Other Clinician: Referring Airiana Elman: Treating Laurens Matheny/Extender: Sammuel Bailiff in Treatment: 16 Visit Information History Since Last Visit Added or deleted any medications: No Patient Arrived: Ambulatory Any new allergies or adverse reactions: No Arrival Time: 13:55 Had a fall or experienced change in No Accompanied By: self activities of daily living that may affect Transfer Assistance: None risk of falls: Patient Identification Verified: Yes Signs or symptoms of abuse/neglect since last visito No Secondary Verification Process Completed: Yes Hospitalized since last visit: No Patient Requires Transmission-Based Precautions: No Implantable device outside of the clinic excluding No Patient Has Alerts: Yes cellular tissue based products placed in the center Patient Alerts: ABI's: 11/21 L:1.17 since last visit: Has Dressing in Place as Prescribed: Yes Pain Present Now: Yes Electronic Signature(s) Signed: 02/20/2021 2:37:05 PM By: Sandre Kitty Entered By: Sandre Kitty on 02/15/2021 13:55:39 -------------------------------------------------------------------------------- Clinic Level of Care Assessment Details Patient Name: Date of Service: GEANNA, DIVIRGILIO 02/15/2021 1:45 PM Medical Record Number: [id number] Patient Account Number: [id number] Date of Birth/Sex: Treating RN: [birth year] (50 y.o. [name], [name] Primary Care Arkin Imran: [name] Other Clinician: Referring Lille Karim: Treating Ellisa Devivo/Extender: Sammuel Bailiff in Treatment: 16 Clinic Level of Care  Assessment Items TOOL 4 Quantity Score X- 1 0 Use when only an EandM is performed on FOLLOW-UP visit ASSESSMENTS - Nursing Assessment / Reassessment X- 1 10 Reassessment of Co-morbidities (includes updates in patient status) X- 1 5 Reassessment of Adherence to Treatment Plan ASSESSMENTS - Wound and Skin A ssessment / Reassessment X - Simple Wound Assessment / Reassessment - one wound 1 5 [] - 0 Complex Wound Assessment / Reassessment - multiple wounds [] - 0 Dermatologic / Skin Assessment (not related to wound area) ASSESSMENTS - Focused Assessment X- 1 5 Circumferential Edema Measurements - multi extremities [] - 0 Nutritional Assessment / Counseling / Intervention [] - 0 Lower Extremity Assessment (monofilament, tuning fork, pulses) [] - 0 Peripheral Arterial Disease Assessment (using hand held doppler) ASSESSMENTS - Ostomy and/or Continence Assessment and Care [] - 0 Incontinence Assessment and Management [] - 0 Ostomy Care Assessment and Management (repouching, etc.) PROCESS - Coordination of Care X - Simple Patient / Family Education for ongoing care 1 15 [] - 0 Complex (extensive) Patient / Family Education for ongoing care X- 1 10 Staff obtains Programmer, systems, Records, T Results / Process Orders est [] - 0 Staff telephones HHA, Nursing Homes / Clarify orders / etc [] - 0 Routine Transfer to another Facility (non-emergent condition) [] - 0 Routine Hospital Admission (non-emergent condition) [] - 0 New Admissions / Biomedical engineer / Ordering NPWT Apligraf, etc. , [] - 0 Emergency Hospital Admission (emergent condition) X- 1 10 Simple Discharge Coordination [] - 0 Complex (extensive) Discharge Coordination PROCESS - Special Needs [] - 0 Pediatric / Minor Patient Management [] - 0 Isolation Patient Management [] - 0 Hearing / Language / Visual special needs [] - 0 Assessment of Community assistance (transportation, D/C planning, etc.) [] -  0 Additional assistance / Altered mentation [] - 0 Support Surface(s) Assessment (bed, cushion, seat, etc.) INTERVENTIONS - Wound Cleansing / Measurement X - Simple Wound Cleansing - one wound 1 5 [] - 0 Complex  Wound Cleansing - multiple wounds X- 1 5 Wound Imaging (photographs - any number of wounds) [] - 0 Wound Tracing (instead of photographs) X- 1 5 Simple Wound Measurement - one wound [] - 0 Complex Wound Measurement - multiple wounds INTERVENTIONS - Wound Dressings [] - 0 Small Wound Dressing one or multiple wounds X- 1 15 Medium Wound Dressing one or multiple wounds [] - 0 Large Wound Dressing one or multiple wounds [] - 0 Application of Medications - topical [] - 0 Application of Medications - injection INTERVENTIONS - Miscellaneous [] - 0 External ear exam [] - 0 Specimen Collection (cultures, biopsies, blood, body fluids, etc.) [] - 0 Specimen(s) / Culture(s) sent or taken to Lab for analysis [] - 0 Patient Transfer (multiple staff / Civil Service fast streamer / Similar devices) [] - 0 Simple Staple / Suture removal (25 or less) [] - 0 Complex Staple / Suture removal (26 or more) [] - 0 Hypo / Hyperglycemic Management (close monitor of Blood Glucose) [] - 0 Ankle / Brachial Index (ABI) - do not check if billed separately X- 1 5 Vital Signs Has the patient been seen at the hospital within the last three years: Yes Total Score: 95 Level Of Care: New/Established - Level 3 Electronic Signature(s) Signed: 02/20/2021 5:23:14 PM By: Rhae Hammock RN Entered By: Rhae Hammock on 02/15/2021 14:48:27 -------------------------------------------------------------------------------- Encounter Discharge Information Details Patient Name: Date of Service: [name], Vinisha D. 02/15/2021 1:45 PM Medical Record Number: [id number] Patient Account Number: [id number] Date of Birth/Sex: Treating RN: 1971/02/25 (50 y.o. [name], [name] Primary Care Nancy Arvin: [name]  Other Clinician: Referring Javaughn Opdahl: Treating Kiarra Kidd/Extender: Sammuel Bailiff in Treatment: 16 Encounter Discharge Information Items Discharge Condition: Stable Ambulatory Status: Ambulatory Discharge Destination: Home Transportation: Private Auto Accompanied By: self Schedule Follow-up Appointment: Yes Clinical Summary of Care: Patient Declined Electronic Signature(s) Signed: 02/20/2021 5:23:14 PM By: Rhae Hammock RN Entered By: Rhae Hammock on 02/15/2021 14:16:19 -------------------------------------------------------------------------------- Lower Extremity Assessment Details Patient Name: Date of Service: [name] 02/15/2021 1:45 PM Medical Record Number: [id number] Patient Account Number: [id number] Date of Birth/Sex: Treating RN: Aug 03, 1970 (50 y.o. [name], [name] Primary Care Redina Zeller: [name] Other Clinician: Referring Marzelle Rutten: Treating Derick Seminara/Extender: Sammuel Bailiff in Treatment: 16 Edema Assessment Assessed: [name]: Yes] [name]: No] Edema: [Left: Ye] [Right: s] Calf Left: Right: Point of Measurement: 25 cm From Medial Instep 45 cm Ankle Left: Right: Point of Measurement: 9 cm From Medial Instep 24.5 cm Vascular Assessment Pulses: Dorsalis Pedis Palpable: [Left:Yes] Posterior Tibial Palpable: [Left:Yes] Electronic Signature(s) Signed: 02/20/2021 5:23:14 PM By: Rhae Hammock RN Entered By: Rhae Hammock on 02/15/2021 14:12:11 -------------------------------------------------------------------------------- Multi Wound Chart Details Patient Name: Date of Service: [name], Sun D. 02/15/2021 1:45 PM Medical Record Number: [id number] Patient Account Number: [id number] Date of Birth/Sex: Treating RN: August 05, 1970 (50 y.o. [name], [name] Primary Care Jalene Demo: [name] Other Clinician: Referring Rory Xiang: Treating Carder Yin/Extender: Sammuel Bailiff in Treatment: 16 Vital Signs Height(in): 67 Capillary Blood Glucose(mg/dl): 135 Weight(lbs): 346 Pulse(bpm): 79 Body Mass Index(BMI): 71 Blood Pressure(mmHg): 150/71 Temperature(F): 98.2 Respiratory Rate(breaths/min): 20 Photos: [N/A:N/A] Left, Medial Lower Leg N/A N/A Wound Location: Gradually Appeared N/A N/A Wounding Event: Venous Leg Ulcer N/A N/A Primary Etiology: Sleep Apnea, Congestive Heart N/A N/A Comorbid History: Failure, Coronary Artery Disease, Hypertension 09/26/2020 N/A N/A Date Acquired: 16 N/A N/A Weeks of Treatment: Open N/A N/A Wound Status: 2.4x4x0.1 N/A N/A Measurements L x W x D (  cm) 7.54 N/A N/A A (cm) : rea 0.754 N/A N/A Volume (cm) : 53.70% N/A N/A % Reduction in Area: 53.70% N/A N/A % Reduction in Volume: Full Thickness Without Exposed N/A N/A Classification: Support Structures Small N/A N/A Exudate Amount: Serosanguineous N/A N/A Exudate Type: red, brown N/A N/A Exudate Color: Flat and Intact N/A N/A Wound Margin: Large (67-100%) N/A N/A Granulation Amount: Red N/A N/A Granulation Quality: None Present (0%) N/A N/A Necrotic Amount: Fat Layer (Subcutaneous Tissue): Yes N/A N/A Exposed Structures: Fascia: No Tendon: No Muscle: No Joint: No Bone: No Medium (34-66%) N/A N/A Epithelialization: Treatment Notes Wound #4 (Lower Leg) Wound Laterality: Left, Medial Cleanser Soap and Water Discharge Instruction: May shower and wash wound with dial antibacterial soap and water prior to dressing change. Peri-Wound Care Sween Lotion (Moisturizing lotion) Discharge Instruction: Apply moisturizing lotion as directed Topical Primary Dressing Hydrofera Blue Classic Foam, 2x2 in Discharge Instruction: Moisten with saline prior to applying to wound bed Secondary Dressing Woven Gauze Sponge, Non-Sterile 4x4 in Discharge Instruction: Apply over primary dressing as directed. Secured  With Compression Wrap Kerlix Roll 4.5x3.1 (in/yd) Discharge Instruction: Apply Kerlix and Coban compression as directed. Coban Self-Adherent Wrap 4x5 (in/yd) Discharge Instruction: Apply over Kerlix as directed. Compression Stockings Add-Ons Electronic Signature(s) Signed: 02/15/2021 3:16:41 PM By: [name] DO Signed: 02/20/2021 5:23:14 PM By: Rhae Hammock RN Entered By: [name] on 02/15/2021 15:04:58 -------------------------------------------------------------------------------- Multi-Disciplinary Care Plan Details Patient Name: Date of Service: [name] 02/15/2021 1:45 PM Medical Record Number: [id number] Patient Account Number: [id number] Date of Birth/Sex: Treating RN: February 12, 1971 (50 y.o. [name], [name] Primary Care Provider: [name] Other Clinician: Referring Provider: Treating Provider/Extender: Sammuel Bailiff in Treatment: 16 Multidisciplinary Care Plan reviewed with physician Active Inactive Venous Leg Ulcer Nursing Diagnoses: Actual venous Insuffiency (use after diagnosis is confirmed) Knowledge deficit related to disease process and management Potential for venous Insuffiency (use before diagnosis confirmed) Goals: Patient will maintain optimal edema control Date Initiated: 10/20/2020 Target Resolution Date: 02/23/2021 Goal Status: Active Interventions: Assess peripheral edema status every visit. Compression as ordered Treatment Activities: Therapeutic compression applied : 10/20/2020 Notes: 12/28/20: Edema control ongoing. Wound/Skin Impairment Nursing Diagnoses: Impaired tissue integrity Knowledge deficit related to ulceration/compromised skin integrity Goals: Patient/caregiver will verbalize understanding of skin care regimen Date Initiated: 10/20/2020 Date Inactivated: 12/28/2020 Target Resolution Date: 12/29/2020 Goal Status: Met Ulcer/skin breakdown will have a volume reduction of  30% by week 4 Date Initiated: 10/20/2020 Date Inactivated: 12/14/2020 Target Resolution Date: 11/24/2020 Goal Status: Met Ulcer/skin breakdown will have a volume reduction of 80% by week 12 Date Initiated: 12/14/2020 Date Inactivated: 01/23/2021 Target Resolution Date: 01/18/2021 Goal Status: Met Ulcer/skin breakdown will heal within 14 weeks Date Initiated: 01/23/2021 Target Resolution Date: 02/24/2021 Goal Status: Active Interventions: Assess patient/caregiver ability to obtain necessary supplies Assess patient/caregiver ability to perform ulcer/skin care regimen upon admission and as needed Assess ulceration(s) every visit Provide education on ulcer and skin care Treatment Activities: Skin care regimen initiated : 10/20/2020 Topical wound management initiated : 10/20/2020 Notes: Electronic Signature(s) Signed: 02/20/2021 5:23:14 PM By: Rhae Hammock RN Entered By: Rhae Hammock on 02/15/2021 14:15:06 -------------------------------------------------------------------------------- Pain Assessment Details Patient Name: Date of Service: [name], Trinna D. 02/15/2021 1:45 PM Medical Record Number: [id number] Patient Account Number: [id number] Date of Birth/Sex: Treating RN: 1970/08/27 (50 y.o. [name], [name] Primary Care Provider: [name] Other Clinician: Referring Provider: Treating Provider/Extender: Sammuel Bailiff in Treatment: 16 Active Problems Location of Pain  Severity and Description of Pain Patient Has Paino Yes Site Locations Rate the pain. Rate the pain. Current Pain Level: 9 Pain Management and Medication Current Pain Management: Electronic Signature(s) Signed: 02/20/2021 2:37:05 PM By: Sandre Kitty Signed: 02/20/2021 5:23:14 PM By: Rhae Hammock RN Entered By: Sandre Kitty on 02/15/2021 13:57:08 -------------------------------------------------------------------------------- Patient/Caregiver  Education Details Patient Name: Date of Service: [name] 10/20/2022andnbsp1:45 PM Medical Record Number: [id number] Patient Account Number: [id number] Date of Birth/Gender: Treating RN: [birth year] (50 y.o. [name], [name] Primary Care Physician: [name] Other Clinician: Referring Physician: Treating Physician/Extender: Sammuel Bailiff in Treatment: 16 Education Assessment Education Provided To: Patient Education Topics Provided Basic Hygiene: Methods: Explain/Verbal Responses: Reinforcements needed, State content correctly Wound/Skin Impairment: Electronic Signature(s) Signed: 02/20/2021 5:23:14 PM By: Rhae Hammock RN Entered By: Rhae Hammock on 02/15/2021 14:15:35 -------------------------------------------------------------------------------- Wound Assessment Details Patient Name: Date of Service: [name] 02/15/2021 1:45 PM Medical Record Number: [id number] Patient Account Number: [id number] Date of Birth/Sex: Treating RN: [birth year] (50 y.o. [name], [name] Primary Care Provider: [name] Other Clinician: Referring Provider: Treating Provider/Extender: Sammuel Bailiff in Treatment: 16 Wound Status Wound Number: 4 Primary Venous Leg Ulcer Etiology: Wound Location: Left, Medial Lower Leg Wound Open Wounding Event: Gradually Appeared Status: Date Acquired: 09/26/2020 Comorbid Sleep Apnea, Congestive Heart Failure, Coronary Artery Weeks Of Treatment: 16 History: Disease, Hypertension Clustered Wound: No Photos Wound Measurements Length: (cm) 2.4 Width: (cm) 4 Depth: (cm) 0.1 Area: (cm) 7.54 Volume: (cm) 0.754 % Reduction in Area: 53.7% % Reduction in Volume: 53.7% Epithelialization: Medium (34-66%) Wound Description Classification: Full Thickness Without Exposed Support Str Wound Margin: Flat and Intact Exudate Amount: Small Exudate Type:  Serosanguineous Exudate Color: red, brown uctures Wound Bed Granulation Amount: Large (67-100%) Exposed Structure Granulation Quality: Red Fascia Exposed: No Necrotic Amount: None Present (0%) Fat Layer (Subcutaneous Tissue) Exposed: Yes Tendon Exposed: No Muscle Exposed: No Joint Exposed: No Bone Exposed: No Electronic Signature(s) Signed: 02/20/2021 2:37:05 PM By: Sandre Kitty Signed: 02/20/2021 5:23:14 PM By: Rhae Hammock RN Entered By: Sandre Kitty on 02/15/2021 14:00:26 -------------------------------------------------------------------------------- [place] Details Patient Name: Date of Service: [name], Makaley D. 02/15/2021 1:45 PM Medical Record Number: [id number] Patient Account Number: [id number] Date of Birth/Sex: Treating RN: [birth year] (50 y.o. [name], [name] Primary Care Provider: [name] Other Clinician: Referring Provider: Treating Provider/Extender: Sammuel Bailiff in Treatment: 16 Vital Signs Time Taken: 13:55 Temperature (F): 98.2 Height (in): 67 Pulse (bpm): 79 Weight (lbs): 346 Respiratory Rate (breaths/min): 20 Body Mass Index (BMI): 54.2 Blood Pressure (mmHg): 150/71 Capillary Blood Glucose (mg/dl): 135 Reference Range: 80 - 120 mg / dl Electronic Signature(s) Signed: 02/20/2021 2:37:05 PM By: Sandre Kitty Entered By: Sandre Kitty on 02/15/2021 13:56:54

## 2021-02-20 NOTE — Progress Notes (Signed)
SHELEEN, CONCHAS ([id number]) Visit Report for 02/15/2021 Chief Complaint Document Details Patient Name: Date of Service: NYKERRIA, MACCONNELL 02/15/2021 1:45 PM Medical Record Number: [id number] Patient Account Number: [id number] Date of Birth/Sex: Treating RN: [birth year] (50 y.o. [name], [name] Primary Care Provider: [name] Other Clinician: Referring Provider: Treating Provider/Extender: Sammuel Bailiff in Treatment: 16 Information Obtained from: Patient Chief Complaint Left lower extremity wound Electronic Signature(s) Signed: 02/15/2021 3:16:41 PM By: [name] DO Entered By: [name] on 02/15/2021 15:05:06 -------------------------------------------------------------------------------- HPI Details Patient Name: Date of Service: [name], Suly D. 02/15/2021 1:45 PM Medical Record Number: [id number] Patient Account Number: [id number] Date of Birth/Sex: Treating RN: May 18, 1970 (50 y.o. [name] Primary Care Provider: [name] Other Clinician: Referring Provider: Treating Provider/Extender: Sammuel Bailiff in Treatment: 16 History of Present Illness HPI Description: Admission 6/24 Ms. Harlei Lehrmann is a 50 year old female with a past medical history of chronic venous insufficiency, lymphedema, DVT on anticoagulation and diastolic heart failure that presents to the clinic for left lower extremity wound. She was last seen 4 months ago in our clinic for the same issue. The reoccurring wound started at the end of May and has been going on for 1 month. She has been using an ointment and I am unclear what this is. She tries to keep her leg elevated with her compression stocking. She also reports she has lymphedema pumps and has been using them as well. She reports mild pain to the area. She denies signs of infection including increased warmth, erythema or purulent drainage. 7/1;  patient presents for 1 week follow-up. She has tolerated the wrap well. Unfortunately she did have trouble with the wrap Sliding down her leg 2 days ago. She has no issues or complaints today. She denies signs of infection. 7/15; patient presents for follow-up. She has tolerated the wrap well. She has no issues or complaints today. She denies signs of infection. 7/25; patient presents for 1 week follow-up. She has no issues or complaints today. She denies signs of infection. 8/4; patient presents for follow-up. She has tolerated the compression wrap well to her left lower extremity. She states she was in the hospital last week due to fluid overload. She subsequently developed blisters to her bottom from being in the hospital bed for prolonged periods of time. These have since ruptured. She now has 2 areas of skin breakdown. She has been keeping gauze on them. She denies signs of infection. 8/18; patient presents for 2-week follow-up. She reports improvement to her buttocks wounds. She is able to tolerate the wrap well to her left lower extremity with no issues. She denies signs of infection. 9/1; patient presents for 2-week follow-up. She has no issues or complaints today. she denies signs of infection. 9/15; patient presents for 2-week follow-up. She has no issues or complaints today. She has tolerated the wrap well. She denies signs of infection. 9/27; patient presents for 1 week follow-up. She has no issues or complaints today. 10/20; patient presents for follow-up. She was on vacation for the past 3 weeks. She states she had the last clinic visit wrap in place for 1 week and then she took this off. She states that she has been using her compression wraps since. She states that the wound has gotten larger. She reports pain to the wound site. She denies fever/chills, nausea/vomiting. She denies purulent drainage. Electronic Signature(s) Signed: 02/15/2021 3:16:41 PM By: [name] DO Entered  By:  [name] on 02/15/2021 42:68:34 -------------------------------------------------------------------------------- Physical Exam Details Patient Name: Date of Service: AVEAH, CASTELL 02/15/2021 1:45 PM Medical Record Number: [id number] Patient Account Number: [id number] Date of Birth/Sex: Treating RN: 1970/05/14 (50 y.o. [name], [name] Primary Care Provider: [name] Other Clinician: Referring Provider: Treating Provider/Extender: Sammuel Bailiff in Treatment: 16 Constitutional respirations regular, non-labored and within target range for patient.. Cardiovascular 2+ dorsalis pedis/posterior tibialis pulses. Psychiatric pleasant and cooperative. Notes Left lower extremity: On the medial aspect there is 1 open wound with granulation tissue present. No increased warmth to the surrounding wound bed. Tenderness to the wound bed. Electronic Signature(s) Signed: 02/15/2021 3:16:41 PM By: [name] DO Entered By: [name] on 02/15/2021 15:08:17 -------------------------------------------------------------------------------- Physician Orders Details Patient Name: Date of Service: [name] 02/15/2021 1:45 PM Medical Record Number: [id number] Patient Account Number: [id number] Date of Birth/Sex: Treating RN: [birth year] (50 y.o. [name], [name] Primary Care Provider: [name] Other Clinician: Referring Provider: Treating Provider/Extender: Sammuel Bailiff in Treatment: [id number] Verbal / Phone Orders: No Diagnosis Coding ICD-10 Coding Code Description [phone] Non-pressure chronic ulcer of other part of left lower leg with unspecified severity I87.2 Venous insufficiency (chronic) (peripheral) I89.0 Lymphedema, not elsewhere classified Z86.718 Personal history of other venous thrombosis and embolism Follow-up Appointments ppointment in 1 week. - Dr. Heber  on Monday Return  A Bathing/ Shower/ Hygiene May shower with protection but do not get wound dressing(s) wet. - use cast protector to keep wrap dry in the shower Edema Control - Lymphedema / SCD / Other Bilateral Lower Extremities Lymphedema Pumps. Use Lymphedema pumps on leg(s) 2-3 times a day for 45-60 minutes. If wearing any wraps or hose, do not remove them. Continue exercising as instructed. Elevate legs to the level of the heart or above for 30 minutes daily and/or when sitting, a frequency of: Avoid standing for long periods of time. Patient to wear own compression stockings every day. - right leg daily Exercise regularly Off-Loading Turn and reposition every 2 hours - Ensure not to apply pressure to these areas. Wound Treatment Wound #4 - Lower Leg Wound Laterality: Left, Medial Cleanser: Soap and Water 1 x Per Week/15 Days Discharge Instructions: May shower and wash wound with dial antibacterial soap and water prior to dressing change. Peri-Wound Care: Sween Lotion (Moisturizing lotion) 1 x Per Week/15 Days Discharge Instructions: Apply moisturizing lotion as directed Prim Dressing: Hydrofera Blue Classic Foam, 2x2 in 1 x Per Week/15 Days ary Discharge Instructions: Moisten with saline prior to applying to wound bed Secondary Dressing: Woven Gauze Sponge, Non-Sterile 4x4 in 1 x Per Week/15 Days Discharge Instructions: Apply over primary dressing as directed. Compression Wrap: Kerlix Roll 4.5x3.1 (in/yd) 1 x Per Week/15 Days Discharge Instructions: Apply Kerlix and Coban compression as directed. Compression Wrap: Coban Self-Adherent Wrap 4x5 (in/yd) 1 x Per Week/15 Days Discharge Instructions: Apply over Kerlix as directed. Laboratory naerobe culture (MICRO) - LLE Bacteria identified in Unspecified specimen by A LOINC Code: 856-3 Convenience Name: Anerobic culture Patient Medications llergies: ACE Inhibitors, hydromorphone, vancomycin, ferrous sulfate A Notifications Medication Indication  Start End 02/15/2021 Keflex DOSE 1 - oral 500 mg capsule - 1 capsule oral QID x 7 days Electronic Signature(s) Signed: 02/15/2021 3:16:41 PM By: [name] DO Previous Signature: 02/15/2021 3:14:04 PM Version By: [name] DO Entered By: [name] on 02/15/2021 15:14:18 -------------------------------------------------------------------------------- Problem List Details Patient Name: Date of Service: [name], Yohana D. 02/15/2021 1:45 PM Medical Record  Number: [id number] Patient Account Number: [id number] Date of Birth/Sex: Treating RN: [birth year] (50 y.o. [name], [name] Primary Care Provider: [name] Other Clinician: Referring Provider: Treating Provider/Extender: Sammuel Bailiff in Treatment: 16 Active Problems ICD-10 Encounter Code Description Active Date MDM Code Description Active Date MDM Diagnosis L97.829 Non-pressure chronic ulcer of other part of left lower leg with unspecified 10/20/2020 No Yes severity I87.2 Venous insufficiency (chronic) (peripheral) 10/20/2020 No Yes I89.0 Lymphedema, not elsewhere classified 10/20/2020 No Yes Z86.718 Personal history of other venous thrombosis and embolism 10/20/2020 No Yes Inactive Problems Resolved Problems Electronic Signature(s) Signed: 02/15/2021 3:16:41 PM By: [name] DO Entered By: [name] on 02/15/2021 15:04:53 -------------------------------------------------------------------------------- Progress Note Details Patient Name: Date of Service: [name], Ivi D. 02/15/2021 1:45 PM Medical Record Number: [id number] Patient Account Number: [id number] Date of Birth/Sex: Treating RN: 31-Aug-1970 (50 y.o. [name], [name] Primary Care Provider: [name] Other Clinician: Referring Provider: Treating Provider/Extender: Sammuel Bailiff in Treatment: 16 Subjective Chief Complaint Information obtained from  Patient Left lower extremity wound History of Present Illness (HPI) Admission 6/24 Ms. Nonna Renninger is a 50 year old female with a past medical history of chronic venous insufficiency, lymphedema, DVT on anticoagulation and diastolic heart failure that presents to the clinic for left lower extremity wound. She was last seen 4 months ago in our clinic for the same issue. The reoccurring wound started at the end of May and has been going on for 1 month. She has been using an ointment and I am unclear what this is. She tries to keep her leg elevated with her compression stocking. She also reports she has lymphedema pumps and has been using them as well. She reports mild pain to the area. She denies signs of infection including increased warmth, erythema or purulent drainage. 7/1; patient presents for 1 week follow-up. She has tolerated the wrap well. Unfortunately she did have trouble with the wrap Sliding down her leg 2 days ago. She has no issues or complaints today. She denies signs of infection. 7/15; patient presents for follow-up. She has tolerated the wrap well. She has no issues or complaints today. She denies signs of infection. 7/25; patient presents for 1 week follow-up. She has no issues or complaints today. She denies signs of infection. 8/4; patient presents for follow-up. She has tolerated the compression wrap well to her left lower extremity. She states she was in the hospital last week due to fluid overload. She subsequently developed blisters to her bottom from being in the hospital bed for prolonged periods of time. These have since ruptured. She now has 2 areas of skin breakdown. She has been keeping gauze on them. She denies signs of infection. 8/18; patient presents for 2-week follow-up. She reports improvement to her buttocks wounds. She is able to tolerate the wrap well to her left lower extremity with no issues. She denies signs of infection. 9/1; patient presents for  2-week follow-up. She has no issues or complaints today. she denies signs of infection. 9/15; patient presents for 2-week follow-up. She has no issues or complaints today. She has tolerated the wrap well. She denies signs of infection. 9/27; patient presents for 1 week follow-up. She has no issues or complaints today. 10/20; patient presents for follow-up. She was on vacation for the past 3 weeks. She states she had the last clinic visit wrap in place for 1 week and then she took this off. She states that she has been using her  compression wraps since. She states that the wound has gotten larger. She reports pain to the wound site. She denies fever/chills, nausea/vomiting. She denies purulent drainage. Patient History Information obtained from Patient. Family History Diabetes - Mother,Maternal Grandparents, Heart Disease - Paternal Grandparents, Hypertension - Mother,Maternal Grandparents, Lung Disease - Father,Paternal Grandparents, No family history of Cancer, Hereditary Spherocytosis, Kidney Disease, Seizures, Stroke, Thyroid Problems, Tuberculosis. Social History Former smoker, Marital Status - Single, Alcohol Use - Never, Drug Use - No History, Caffeine Use - Daily. Medical History Eyes Denies history of Cataracts, Glaucoma, Optic Neuritis Ear/Nose/Mouth/Throat Denies history of Chronic sinus problems/congestion, Middle ear problems Hematologic/Lymphatic Denies history of Anemia, Hemophilia, Human Immunodeficiency Virus, Lymphedema, Sickle Cell Disease Respiratory Patient has history of Sleep Apnea Denies history of Aspiration, Asthma, Chronic Obstructive Pulmonary Disease (COPD), Pneumothorax, Tuberculosis Cardiovascular Patient has history of Congestive Heart Failure, Coronary Artery Disease, Hypertension Denies history of Angina, Arrhythmia, Deep Vein Thrombosis, Hypotension, Myocardial Infarction, Peripheral Arterial Disease, Peripheral Venous Disease, Phlebitis,  Vasculitis Gastrointestinal Denies history of Cirrhosis , Colitis, Crohnoos, Hepatitis A, Hepatitis B, Hepatitis C Endocrine Denies history of Type I Diabetes, Type II Diabetes Genitourinary Denies history of End Stage Renal Disease Immunological Denies history of Lupus Erythematosus, Raynaudoos, Scleroderma Integumentary (Skin) Denies history of History of Burn Musculoskeletal Denies history of Gout, Rheumatoid Arthritis, Osteoarthritis, Osteomyelitis Neurologic Denies history of Dementia, Neuropathy, Quadriplegia, Paraplegia, Seizure Disorder Oncologic Denies history of Received Chemotherapy, Received Radiation Psychiatric Denies history of Anorexia/bulimia, Confinement Anxiety Objective Constitutional respirations regular, non-labored and within target range for patient.. Vitals Time Taken: 1:55 PM, Height: 67 in, Weight: 346 lbs, BMI: 54.2, Temperature: 98.2 F, Pulse: 79 bpm, Respiratory Rate: 20 breaths/min, Blood Pressure: 150/71 mmHg, Capillary Blood Glucose: 135 mg/dl. Cardiovascular 2+ dorsalis pedis/posterior tibialis pulses. Psychiatric pleasant and cooperative. General Notes: Left lower extremity: On the medial aspect there is 1 open wound with granulation tissue present. No increased warmth to the surrounding wound bed. Tenderness to the wound bed. Integumentary (Hair, Skin) Wound #4 status is Open. Original cause of wound was Gradually Appeared. The date acquired was: 09/26/2020. The wound has been in treatment 16 weeks. The wound is located on the Left,Medial Lower Leg. The wound measures 2.4cm length x 4cm width x 0.1cm depth; 7.54cm^2 area and 0.754cm^3 volume. There is Fat Layer (Subcutaneous Tissue) exposed. There is a small amount of serosanguineous drainage noted. The wound margin is flat and intact. There is large (67-100%) red granulation within the wound bed. There is no necrotic tissue within the wound bed. Assessment Active  Problems ICD-10 Non-pressure chronic ulcer of other part of left lower leg with unspecified severity Venous insufficiency (chronic) (peripheral) Lymphedema, not elsewhere classified Personal history of other venous thrombosis and embolism Patient went on vacation and unfortunately was not able to have weekly compression wraps from our office. She did use her own Velcro compression wrap which she states had no elasticity. Her wound has gotten larger. She is having more pain to the wound bed and it is unclear if this is due to a possible infection versus increased swelling from not having adequate compression therapy. I obtained a wound culture and will treat with Keflex. We will do a light wrap with Kerlix/Coban with Hydrofera Blue. I will see her early next week to assure there are no issues. At that time we may be able to transition to her regular 2 layer Coflex. Plan Follow-up Appointments: Return Appointment in 1 week. - Dr. Heber Bells on Monday Bathing/ Shower/ Hygiene: May shower with  protection but do not get wound dressing(s) wet. - use cast protector to keep wrap dry in the shower Edema Control - Lymphedema / SCD / Other: Lymphedema Pumps. Use Lymphedema pumps on leg(s) 2-3 times a day for 45-60 minutes. If wearing any wraps or hose, do not remove them. Continue exercising as instructed. Elevate legs to the level of the heart or above for 30 minutes daily and/or when sitting, a frequency of: Avoid standing for long periods of time. Patient to wear own compression stockings every day. - right leg daily Exercise regularly Off-Loading: Turn and reposition every 2 hours - Ensure not to apply pressure to these areas. Laboratory ordered were: Anerobic culture - LLE The following medication(s) was prescribed: Keflex oral 500 mg capsule 1 1 capsule oral QID x 7 days starting 02/15/2021 WOUND #4: - Lower Leg Wound Laterality: Left, Medial Cleanser: Soap and Water 1 x Per Week/15  Days Discharge Instructions: May shower and wash wound with dial antibacterial soap and water prior to dressing change. Peri-Wound Care: Sween Lotion (Moisturizing lotion) 1 x Per Week/15 Days Discharge Instructions: Apply moisturizing lotion as directed Prim Dressing: Hydrofera Blue Classic Foam, 2x2 in 1 x Per Week/15 Days ary Discharge Instructions: Moisten with saline prior to applying to wound bed Secondary Dressing: Woven Gauze Sponge, Non-Sterile 4x4 in 1 x Per Week/15 Days Discharge Instructions: Apply over primary dressing as directed. Com pression Wrap: Kerlix Roll 4.5x3.1 (in/yd) 1 x Per Week/15 Days Discharge Instructions: Apply Kerlix and Coban compression as directed. Com pression Wrap: Coban Self-Adherent Wrap 4x5 (in/yd) 1 x Per Week/15 Days Discharge Instructions: Apply over Kerlix as directed. 1. Wound culture 2. Keflex 3. Hydrofera Blue under Kerlix/Coban 4. Follow-up next week Electronic Signature(s) Signed: 02/15/2021 3:16:41 PM By: [name] DO Entered By: [name] on 02/15/2021 15:15:48 -------------------------------------------------------------------------------- HxROS Details Patient Name: Date of Service: [name], Shaneisha D. 02/15/2021 1:45 PM Medical Record Number: [id number] Patient Account Number: [id number] Date of Birth/Sex: Treating RN: 1970-05-08 (50 y.o. [name], [name] Primary Care Provider: [name] Other Clinician: Referring Provider: Treating Provider/Extender: Sammuel Bailiff in Treatment: 16 Information Obtained From Patient Eyes Medical History: Negative for: Cataracts; Glaucoma; Optic Neuritis Ear/Nose/Mouth/Throat Medical History: Negative for: Chronic sinus problems/congestion; Middle ear problems Hematologic/Lymphatic Medical History: Negative for: Anemia; Hemophilia; Human Immunodeficiency Virus; Lymphedema; Sickle Cell Disease Respiratory Medical History: Positive for:  Sleep Apnea Negative for: Aspiration; Asthma; Chronic Obstructive Pulmonary Disease (COPD); Pneumothorax; Tuberculosis Cardiovascular Medical History: Positive for: Congestive Heart Failure; Coronary Artery Disease; Hypertension Negative for: Angina; Arrhythmia; Deep Vein Thrombosis; Hypotension; Myocardial Infarction; Peripheral Arterial Disease; Peripheral Venous Disease; Phlebitis; Vasculitis Gastrointestinal Medical History: Negative for: Cirrhosis ; Colitis; Crohns; Hepatitis A; Hepatitis B; Hepatitis C Endocrine Medical History: Negative for: Type I Diabetes; Type II Diabetes Genitourinary Medical History: Negative for: End Stage Renal Disease Immunological Medical History: Negative for: Lupus Erythematosus; Raynauds; Scleroderma Integumentary (Skin) Medical History: Negative for: History of Burn Musculoskeletal Medical History: Negative for: Gout; Rheumatoid Arthritis; Osteoarthritis; Osteomyelitis Neurologic Medical History: Negative for: Dementia; Neuropathy; Quadriplegia; Paraplegia; Seizure Disorder Oncologic Medical History: Negative for: Received Chemotherapy; Received Radiation Psychiatric Medical History: Negative for: Anorexia/bulimia; Confinement Anxiety Immunizations Pneumococcal Vaccine: Received Pneumococcal Vaccination: No Implantable Devices None Family and Social History Cancer: No; Diabetes: Yes - Mother,Maternal Grandparents; Heart Disease: Yes - Paternal Grandparents; Hereditary Spherocytosis: No; Hypertension: Yes - Mother,Maternal Grandparents; Kidney Disease: No; Lung Disease: Yes - Father,Paternal Grandparents; Seizures: No; Stroke: No; Thyroid Problems: No; Tuberculosis: No; Former smoker; Marital Status -  Single; Alcohol Use: Never; Drug Use: No History; Caffeine Use: Daily; Financial Concerns: No; Food, Clothing or Shelter Needs: No; Support System Lacking: No; Transportation Concerns: No Electronic Signature(s) Signed: 02/15/2021 3:16:41  PM By: [name] DO Signed: 02/20/2021 5:23:14 PM By: Rhae Hammock RN Entered By: [name] on 02/15/2021 15:06:26 -------------------------------------------------------------------------------- SuperBill Details Patient Name: Date of Service: [name] 02/15/2021 Medical Record Number: [id number] Patient Account Number: [id number] Date of Birth/Sex: Treating RN: [birth year] (50 y.o. [name], [name] Primary Care Provider: [name] Other Clinician: Referring Provider: Treating Provider/Extender: Sammuel Bailiff in Treatment: 16 Diagnosis Coding ICD-10 Codes Code Description [phone] Non-pressure chronic ulcer of other part of left lower leg with unspecified severity I87.2 Venous insufficiency (chronic) (peripheral) I89.0 Lymphedema, not elsewhere classified Z86.718 Personal history of other venous thrombosis and embolism L03.115 Cellulitis of right lower limb Facility Procedures CPT4 Code: [id number] Description: 99213 - WOUND CARE VISIT-LEV 3 EST PT Modifier: Quantity: 1 Physician Procedures : CPT4 Code Description Modifier [id number] - WC PHYS LEVEL 4 - EST PT ICD-10 Diagnosis Description L97.829 Non-pressure chronic ulcer of other part of left lower leg with unspecified severity I87.2 Venous insufficiency (chronic) (peripheral) I89.0  Lymphedema, not elsewhere classified Quantity: 1 Electronic Signature(s) Signed: 02/15/2021 3:16:41 PM By: [name] DO Entered By: [name] on 02/15/2021 15:16:19

## 2021-02-21 ENCOUNTER — Other Ambulatory Visit: Payer: Self-pay

## 2021-02-21 DIAGNOSIS — D500 Iron deficiency anemia secondary to blood loss (chronic): Secondary | ICD-10-CM

## 2021-02-21 LAB — AEROBIC/ANAEROBIC CULTURE W GRAM STAIN (SURGICAL/DEEP WOUND): Gram Stain: NONE SEEN

## 2021-02-22 ENCOUNTER — Inpatient Hospital Stay: Payer: Medicaid Other

## 2021-02-22 ENCOUNTER — Other Ambulatory Visit: Payer: Self-pay

## 2021-02-22 ENCOUNTER — Inpatient Hospital Stay: Payer: Medicaid Other | Attending: Hematology | Admitting: Hematology

## 2021-02-22 VITALS — BP 116/74 | HR 20 | Temp 97.3°F | Resp 20 | Wt 337.2 lb

## 2021-02-22 DIAGNOSIS — K922 Gastrointestinal hemorrhage, unspecified: Secondary | ICD-10-CM | POA: Diagnosis not present

## 2021-02-22 DIAGNOSIS — I872 Venous insufficiency (chronic) (peripheral): Secondary | ICD-10-CM | POA: Diagnosis not present

## 2021-02-22 DIAGNOSIS — D500 Iron deficiency anemia secondary to blood loss (chronic): Secondary | ICD-10-CM

## 2021-02-22 DIAGNOSIS — Z95828 Presence of other vascular implants and grafts: Secondary | ICD-10-CM

## 2021-02-22 DIAGNOSIS — Z7901 Long term (current) use of anticoagulants: Secondary | ICD-10-CM | POA: Insufficient documentation

## 2021-02-22 LAB — CBC WITH DIFFERENTIAL (CANCER CENTER ONLY)
Abs Immature Granulocytes: 0.03 10*3/uL (ref 0.00–0.07)
Basophils Absolute: 0 10*3/uL (ref 0.0–0.1)
Basophils Relative: 0 %
Eosinophils Absolute: 0.1 10*3/uL (ref 0.0–0.5)
Eosinophils Relative: 2 %
HCT: 38.5 % (ref 36.0–46.0)
Hemoglobin: 11.1 g/dL — ABNORMAL LOW (ref 12.0–15.0)
Immature Granulocytes: 1 %
Lymphocytes Relative: 17 %
Lymphs Abs: 0.9 10*3/uL (ref 0.7–4.0)
MCH: 25.4 pg — ABNORMAL LOW (ref 26.0–34.0)
MCHC: 28.8 g/dL — ABNORMAL LOW (ref 30.0–36.0)
MCV: 88.1 fL (ref 80.0–100.0)
Monocytes Absolute: 0.5 10*3/uL (ref 0.1–1.0)
Monocytes Relative: 10 %
Neutro Abs: 3.8 10*3/uL (ref 1.7–7.7)
Neutrophils Relative %: 70 %
Platelet Count: 162 10*3/uL (ref 150–400)
RBC: 4.37 MIL/uL (ref 3.87–5.11)
RDW: 14.9 % (ref 11.5–15.5)
WBC Count: 5.4 10*3/uL (ref 4.0–10.5)
nRBC: 0 % (ref 0.0–0.2)

## 2021-02-22 LAB — CMP (CANCER CENTER ONLY)
ALT: 9 U/L (ref 0–44)
AST: 15 U/L (ref 15–41)
Albumin: 3.9 g/dL (ref 3.5–5.0)
Alkaline Phosphatase: 78 U/L (ref 38–126)
Anion gap: 11 (ref 5–15)
BUN: 12 mg/dL (ref 6–20)
CO2: 27 mmol/L (ref 22–32)
Calcium: 9.5 mg/dL (ref 8.9–10.3)
Chloride: 101 mmol/L (ref 98–111)
Creatinine: 0.98 mg/dL (ref 0.44–1.00)
GFR, Estimated: >60 mL/min (ref >60)
Glucose, Bld: 111 mg/dL — ABNORMAL HIGH (ref 70–99)
Potassium: 4.3 mmol/L (ref 3.5–5.1)
Sodium: 139 mmol/L (ref 135–145)
Total Bilirubin: 0.9 mg/dL (ref 0.3–1.2)
Total Protein: 8.5 g/dL — ABNORMAL HIGH (ref 6.5–8.1)

## 2021-02-22 LAB — IRON AND TIBC
Iron: 48 ug/dL (ref 41–142)
Saturation Ratios: 12 % — ABNORMAL LOW (ref 21–57)
TIBC: 399 ug/dL (ref 236–444)
UIBC: 351 ug/dL (ref 120–384)

## 2021-02-22 LAB — FERRITIN: Ferritin: 24 ng/mL (ref 11–307)

## 2021-02-22 MED ORDER — HEPARIN SOD (PORK) LOCK FLUSH 100 UNIT/ML IV SOLN
500.0000 [IU] | Freq: Once | INTRAVENOUS | Status: AC
  Administered 2021-02-22: 500 [IU] via INTRAVENOUS

## 2021-02-22 MED ORDER — SODIUM CHLORIDE 0.9% FLUSH
10.0000 mL | INTRAVENOUS | Status: DC | PRN
  Administered 2021-02-22: 10 mL via INTRAVENOUS

## 2021-02-23 ENCOUNTER — Encounter (HOSPITAL_BASED_OUTPATIENT_CLINIC_OR_DEPARTMENT_OTHER): Payer: Medicaid Other | Admitting: Internal Medicine

## 2021-02-23 DIAGNOSIS — I872 Venous insufficiency (chronic) (peripheral): Secondary | ICD-10-CM

## 2021-02-23 DIAGNOSIS — L97829 Non-pressure chronic ulcer of other part of left lower leg with unspecified severity: Secondary | ICD-10-CM

## 2021-02-23 DIAGNOSIS — I890 Lymphedema, not elsewhere classified: Secondary | ICD-10-CM

## 2021-02-23 DIAGNOSIS — Z86718 Personal history of other venous thrombosis and embolism: Secondary | ICD-10-CM | POA: Diagnosis not present

## 2021-02-23 NOTE — Progress Notes (Signed)
[name] ([id number]) Visit Report for 02/23/2021 Chief Complaint Document Details Patient Name: Date of Service: TAMAIRA, [name] 02/23/2021 9:15 A M Medical Record Number: [id number] Patient Account Number: [id number] Date of Birth/Sex: Treating RN: 11/04/1970 (50 y.o. [name] Primary Care Provider: [name] Other Clinician: Referring Provider: Treating Provider/Extender: Sammuel Bailiff in Treatment: 18 Information Obtained from: Patient Chief Complaint Left lower extremity wound Electronic Signature(s) Signed: 02/23/2021 10:45:13 AM By: [name] DO Entered By: [name] on 02/23/2021 10:39:04 -------------------------------------------------------------------------------- HPI Details Patient Name: Date of Service: [name], Osceola D. 02/23/2021 9:15 A M Medical Record Number: [id number] Patient Account Number: [id number] Date of Birth/Sex: Treating RN: [birth year] (50 y.o. [name] Primary Care Provider: [name] Other Clinician: Referring Provider: Treating Provider/Extender: Sammuel Bailiff in Treatment: 18 History of Present Illness HPI Description: Admission 6/24 [name] is a 50 year old female with a past medical history of chronic venous insufficiency, lymphedema, DVT on anticoagulation and diastolic heart failure that presents to the clinic for left lower extremity wound. She was last seen 4 months ago in our clinic for the same issue. The reoccurring wound started at the end of May and has been going on for 1 month. She has been using an ointment and I am unclear what this is. She tries to keep her leg elevated with her compression stocking. She also reports she has lymphedema pumps and has been using them as well. She reports mild pain to the area. She denies signs of infection including increased warmth, erythema or purulent drainage. 7/1;  patient presents for 1 week follow-up. She has tolerated the wrap well. Unfortunately she did have trouble with the wrap Sliding down her leg 2 days ago. She has no issues or complaints today. She denies signs of infection. 7/15; patient presents for follow-up. She has tolerated the wrap well. She has no issues or complaints today. She denies signs of infection. 7/25; patient presents for 1 week follow-up. She has no issues or complaints today. She denies signs of infection. 8/4; patient presents for follow-up. She has tolerated the compression wrap well to her left lower extremity. She states she was in the hospital last week due to fluid overload. She subsequently developed blisters to her bottom from being in the hospital bed for prolonged periods of time. These have since ruptured. She now has 2 areas of skin breakdown. She has been keeping gauze on them. She denies signs of infection. 8/18; patient presents for 2-week follow-up. She reports improvement to her buttocks wounds. She is able to tolerate the wrap well to her left lower extremity with no issues. She denies signs of infection. 9/1; patient presents for 2-week follow-up. She has no issues or complaints today. she denies signs of infection. 9/15; patient presents for 2-week follow-up. She has no issues or complaints today. She has tolerated the wrap well. She denies signs of infection. 9/27; patient presents for 1 week follow-up. She has no issues or complaints today. 10/20; patient presents for follow-up. She was on vacation for the past 3 weeks. She states she had the last clinic visit wrap in place for 1 week and then she took this off. She states that she has been using her compression wraps since. She states that the wound has gotten larger. She reports pain to the wound site. She denies fever/chills, nausea/vomiting. She denies purulent drainage. 10/24; patient presents for follow-up. She states she tolerated the Kerlix/Coban  wrap  well. She reports decrease in pain. She states she started Keflex. She denies systemic signs of infection. 10/28; patient presents for follow-up. She has no issues or complaints today. She denies acute pain. She tolerated the wrap well. Electronic Signature(s) Signed: 02/23/2021 10:45:13 AM By: [name] DO Entered By: [name] on 02/23/2021 10:39:54 -------------------------------------------------------------------------------- Physical Exam Details Patient Name: Date of Service: [name] 02/23/2021 9:15 A M Medical Record Number: [id number] Patient Account Number: [id number] Date of Birth/Sex: Treating RN: 02/06/1971 (50 y.o. [name] Primary Care Provider: [name] Other Clinician: Referring Provider: Treating Provider/Extender: Sammuel Bailiff in Treatment: 18 Constitutional respirations regular, non-labored and within target range for patient.. Cardiovascular 2+ dorsalis pedis/posterior tibialis pulses. Psychiatric pleasant and cooperative. Notes Left lower extremity: On the medial aspect there is an open wound with granulation tissue present with scant non viable tissue. No signs of infection. Minimal tenderness to the wound bed. Electronic Signature(s) Signed: 02/23/2021 10:45:13 AM By: [name] DO Entered By: [name] on 02/23/2021 10:40:47 -------------------------------------------------------------------------------- Physician Orders Details Patient Name: Date of Service: [name] 02/23/2021 9:15 A M Medical Record Number: [id number] Patient Account Number: [id number] Date of Birth/Sex: Treating RN: 16-Aug-1970 (50 y.o. [name] Primary Care Provider: [name] Other Clinician: Referring Provider: Treating Provider/Extender: Sammuel Bailiff in Treatment: [id number] Verbal / Phone Orders: No Diagnosis Coding ICD-10 Coding Code  Description [phone] Non-pressure chronic ulcer of other part of left lower leg with unspecified severity I87.2 Venous insufficiency (chronic) (peripheral) I89.0 Lymphedema, not elsewhere classified Z86.718 Personal history of other venous thrombosis and embolism Follow-up Appointments ppointment in 1 week. - Friday Dr. Heber [place] Return A Bathing/ Shower/ Hygiene May shower with protection but do not get wound dressing(s) wet. - use cast protector to keep wrap dry in the shower Edema Control - Lymphedema / SCD / Other Bilateral Lower Extremities Lymphedema Pumps. Use Lymphedema pumps on leg(s) 2-3 times a day for 45-60 minutes. If wearing any wraps or hose, do not remove them. Continue exercising as instructed. Elevate legs to the level of the heart or above for 30 minutes daily and/or when sitting, a frequency of: Avoid standing for long periods of time. Patient to wear own compression stockings every day. - right leg daily Exercise regularly Off-Loading Turn and reposition every 2 hours - Do not apply pressure to these areas. Wound Treatment Wound #4 - Lower Leg Wound Laterality: Left, Medial Cleanser: Soap and Water 1 x Per Week/15 Days Discharge Instructions: May shower and wash wound with dial antibacterial soap and water prior to dressing change. Peri-Wound Care: Sween Lotion (Moisturizing lotion) 1 x Per Week/15 Days Discharge Instructions: Apply moisturizing lotion as directed Topical: Gentamicin 1 x Per Week/15 Days Discharge Instructions: As directed by physician Prim Dressing: Hydrofera Blue Classic Foam, 2x2 in 1 x Per Week/15 Days ary Discharge Instructions: Moisten with saline prior to applying to wound bed Secondary Dressing: Woven Gauze Sponge, Non-Sterile 4x4 in 1 x Per Week/15 Days Discharge Instructions: Apply over primary dressing as directed. Compression Wrap: ThreePress (3 layer compression wrap) 1 x Per Week/15 Days Discharge Instructions: Apply three layer  compression as directed. Compression Wrap: Unnaboot w/Calamine, 4x10 (in/yd) 1 x Per Week/15 Days Discharge Instructions: Apply at top to anchor wrap Electronic Signature(s) Signed: 02/23/2021 10:45:13 AM By: [name] DO Entered By: [name] on 02/23/2021 10:41:02 -------------------------------------------------------------------------------- Problem List Details Patient Name: Date of Service: [name], Zaley D.  02/23/2021 9:15 A M Medical Record Number: [id number] Patient Account Number: [id number] Date of Birth/Sex: Treating RN: 25-Jun-1970 (50 y.o. [name] Primary Care Provider: [name] Other Clinician: Referring Provider: Treating Provider/Extender: Sammuel Bailiff in Treatment: 18 Active Problems ICD-10 Encounter Code Description Active Date MDM Diagnosis L97.829 Non-pressure chronic ulcer of other part of left lower leg with unspecified 10/20/2020 No Yes severity I87.2 Venous insufficiency (chronic) (peripheral) 10/20/2020 No Yes I89.0 Lymphedema, not elsewhere classified 10/20/2020 No Yes Z86.718 Personal history of other venous thrombosis and embolism 10/20/2020 No Yes Inactive Problems Resolved Problems Electronic Signature(s) Signed: 02/23/2021 10:45:13 AM By: [name] DO Entered By: [name] on 02/23/2021 10:38:43 -------------------------------------------------------------------------------- Progress Note Details Patient Name: Date of Service: [name], Truda D. 02/23/2021 9:15 A M Medical Record Number: [id number] Patient Account Number: [id number] Date of Birth/Sex: Treating RN: 1971-03-20 (50 y.o. [name] Primary Care Provider: [name] Other Clinician: Referring Provider: Treating Provider/Extender: Sammuel Bailiff in Treatment: 18 Subjective Chief Complaint Information obtained from Patient Left lower extremity wound History of  Present Illness (HPI) Admission 6/24 Ms. Calliope Delangel is a 50 year old female with a past medical history of chronic venous insufficiency, lymphedema, DVT on anticoagulation and diastolic heart failure that presents to the clinic for left lower extremity wound. She was last seen 4 months ago in our clinic for the same issue. The reoccurring wound started at the end of May and has been going on for 1 month. She has been using an ointment and I am unclear what this is. She tries to keep her leg elevated with her compression stocking. She also reports she has lymphedema pumps and has been using them as well. She reports mild pain to the area. She denies signs of infection including increased warmth, erythema or purulent drainage. 7/1; patient presents for 1 week follow-up. She has tolerated the wrap well. Unfortunately she did have trouble with the wrap Sliding down her leg 2 days ago. She has no issues or complaints today. She denies signs of infection. 7/15; patient presents for follow-up. She has tolerated the wrap well. She has no issues or complaints today. She denies signs of infection. 7/25; patient presents for 1 week follow-up. She has no issues or complaints today. She denies signs of infection. 8/4; patient presents for follow-up. She has tolerated the compression wrap well to her left lower extremity. She states she was in the hospital last week due to fluid overload. She subsequently developed blisters to her bottom from being in the hospital bed for prolonged periods of time. These have since ruptured. She now has 2 areas of skin breakdown. She has been keeping gauze on them. She denies signs of infection. 8/18; patient presents for 2-week follow-up. She reports improvement to her buttocks wounds. She is able to tolerate the wrap well to her left lower extremity with no issues. She denies signs of infection. 9/1; patient presents for 2-week follow-up. She has no issues or complaints  today. she denies signs of infection. 9/15; patient presents for 2-week follow-up. She has no issues or complaints today. She has tolerated the wrap well. She denies signs of infection. 9/27; patient presents for 1 week follow-up. She has no issues or complaints today. 10/20; patient presents for follow-up. She was on vacation for the past 3 weeks. She states she had the last clinic visit wrap in place for 1 week and then she took this off. She states that she has been  using her compression wraps since. She states that the wound has gotten larger. She reports pain to the wound site. She denies fever/chills, nausea/vomiting. She denies purulent drainage. 10/24; patient presents for follow-up. She states she tolerated the Kerlix/Coban wrap well. She reports decrease in pain. She states she started Keflex. She denies systemic signs of infection. 10/28; patient presents for follow-up. She has no issues or complaints today. She denies acute pain. She tolerated the wrap well. Patient History Information obtained from Patient. Family History Diabetes - Mother,Maternal Grandparents, Heart Disease - Paternal Grandparents, Hypertension - Mother,Maternal Grandparents, Lung Disease - Father,Paternal Grandparents, No family history of Cancer, Hereditary Spherocytosis, Kidney Disease, Seizures, Stroke, Thyroid Problems, Tuberculosis. Social History Former smoker, Marital Status - Single, Alcohol Use - Never, Drug Use - No History, Caffeine Use - Daily. Medical History Eyes Denies history of Cataracts, Glaucoma, Optic Neuritis Ear/Nose/Mouth/Throat Denies history of Chronic sinus problems/congestion, Middle ear problems Hematologic/Lymphatic Denies history of Anemia, Hemophilia, Human Immunodeficiency Virus, Lymphedema, Sickle Cell Disease Respiratory Patient has history of Sleep Apnea Denies history of Aspiration, Asthma, Chronic Obstructive Pulmonary Disease (COPD), Pneumothorax,  Tuberculosis Cardiovascular Patient has history of Congestive Heart Failure, Coronary Artery Disease, Hypertension Denies history of Angina, Arrhythmia, Deep Vein Thrombosis, Hypotension, Myocardial Infarction, Peripheral Arterial Disease, Peripheral Venous Disease, Phlebitis, Vasculitis Gastrointestinal Denies history of Cirrhosis , Colitis, Crohnoos, Hepatitis A, Hepatitis B, Hepatitis C Endocrine Denies history of Type I Diabetes, Type II Diabetes Genitourinary Denies history of End Stage Renal Disease Immunological Denies history of Lupus Erythematosus, Raynaudoos, Scleroderma Integumentary (Skin) Denies history of History of Burn Musculoskeletal Denies history of Gout, Rheumatoid Arthritis, Osteoarthritis, Osteomyelitis Neurologic Denies history of Dementia, Neuropathy, Quadriplegia, Paraplegia, Seizure Disorder Oncologic Denies history of Received Chemotherapy, Received Radiation Psychiatric Denies history of Anorexia/bulimia, Confinement Anxiety Objective Constitutional respirations regular, non-labored and within target range for patient.. Vitals Time Taken: 9:34 AM, Height: 67 in, Weight: 346 lbs, BMI: 54.2, Temperature: 97.9 F, Pulse: 97 bpm, Respiratory Rate: 20 breaths/min, Blood Pressure: 126/74 mmHg, Capillary Blood Glucose: 185 mg/dl. Cardiovascular 2+ dorsalis pedis/posterior tibialis pulses. Psychiatric pleasant and cooperative. General Notes: Left lower extremity: On the medial aspect there is an open wound with granulation tissue present with scant non viable tissue. No signs of infection. Minimal tenderness to the wound bed. Integumentary (Hair, Skin) Wound #4 status is Open. Original cause of wound was Gradually Appeared. The date acquired was: 09/26/2020. The wound has been in treatment 18 weeks. The wound is located on the Left,Medial Lower Leg. The wound measures 2.5cm length x 3.5cm width x 0.1cm depth; 6.872cm^2 area and 0.687cm^3 volume. There is Fat  Layer (Subcutaneous Tissue) exposed. There is no tunneling or undermining noted. There is a medium amount of serosanguineous drainage noted. The wound margin is distinct with the outline attached to the wound base. There is large (67-100%) red granulation within the wound bed. There is a small (1-33%) amount of necrotic tissue within the wound bed including Adherent Slough. Assessment Active Problems ICD-10 Non-pressure chronic ulcer of other part of left lower leg with unspecified severity Venous insufficiency (chronic) (peripheral) Lymphedema, not elsewhere classified Personal history of other venous thrombosis and embolism Patient's wound has shown improvement in size and appearance since last clinic visit. There are no signs of infection on exam. There are some scant nonviable tissue and I will continue with Hydrofera Blue under 3 layer compression. She will likely need debridement at next clinic visit. Follow-up in 1 week Procedures Wound #4 Pre-procedure diagnosis of Wound #4 is  a Venous Leg Ulcer located on the Left,Medial Lower Leg . There was a Three Layer Compression Therapy Procedure by Baruch Gouty, RN. Post procedure Diagnosis Wound #4: Same as Pre-Procedure Plan Follow-up Appointments: Return Appointment in 1 week. - Friday Dr. Heber [place] Bathing/ Shower/ Hygiene: May shower with protection but do not get wound dressing(s) wet. - use cast protector to keep wrap dry in the shower Edema Control - Lymphedema / SCD / Other: Lymphedema Pumps. Use Lymphedema pumps on leg(s) 2-3 times a day for 45-60 minutes. If wearing any wraps or hose, do not remove them. Continue exercising as instructed. Elevate legs to the level of the heart or above for 30 minutes daily and/or when sitting, a frequency of: Avoid standing for long periods of time. Patient to wear own compression stockings every day. - right leg daily Exercise regularly Off-Loading: Turn and reposition every 2 hours - Do not  apply pressure to these areas. WOUND #4: - Lower Leg Wound Laterality: Left, Medial Cleanser: Soap and Water 1 x Per Week/15 Days Discharge Instructions: May shower and wash wound with dial antibacterial soap and water prior to dressing change. Peri-Wound Care: Sween Lotion (Moisturizing lotion) 1 x Per Week/15 Days Discharge Instructions: Apply moisturizing lotion as directed Topical: Gentamicin 1 x Per Week/15 Days Discharge Instructions: As directed by physician Prim Dressing: Hydrofera Blue Classic Foam, 2x2 in 1 x Per Week/15 Days ary Discharge Instructions: Moisten with saline prior to applying to wound bed Secondary Dressing: Woven Gauze Sponge, Non-Sterile 4x4 in 1 x Per Week/15 Days Discharge Instructions: Apply over primary dressing as directed. Com pression Wrap: ThreePress (3 layer compression wrap) 1 x Per Week/15 Days Discharge Instructions: Apply three layer compression as directed. Com pression Wrap: Unnaboot w/Calamine, 4x10 (in/yd) 1 x Per Week/15 Days Discharge Instructions: Apply at top to anchor wrap 1. Hydrofera Blue under 3 layer compression 2. Follow-up in 1 week Electronic Signature(s) Signed: 02/23/2021 10:45:13 AM By: [name] DO Entered By: [name] on 02/23/2021 10:43:49 -------------------------------------------------------------------------------- HxROS Details Patient Name: Date of Service: [name], Bedie D. 02/23/2021 9:15 A M Medical Record Number: [id number] Patient Account Number: [id number] Date of Birth/Sex: Treating RN: Aug 31, 1970 (50 y.o. [name] Primary Care Provider: [name] Other Clinician: Referring Provider: Treating Provider/Extender: Sammuel Bailiff in Treatment: 18 Information Obtained From Patient Eyes Medical History: Negative for: Cataracts; Glaucoma; Optic Neuritis Ear/Nose/Mouth/Throat Medical History: Negative for: Chronic sinus problems/congestion; Middle ear  problems Hematologic/Lymphatic Medical History: Negative for: Anemia; Hemophilia; Human Immunodeficiency Virus; Lymphedema; Sickle Cell Disease Respiratory Medical History: Positive for: Sleep Apnea Negative for: Aspiration; Asthma; Chronic Obstructive Pulmonary Disease (COPD); Pneumothorax; Tuberculosis Cardiovascular Medical History: Positive for: Congestive Heart Failure; Coronary Artery Disease; Hypertension Negative for: Angina; Arrhythmia; Deep Vein Thrombosis; Hypotension; Myocardial Infarction; Peripheral Arterial Disease; Peripheral Venous Disease; Phlebitis; Vasculitis Gastrointestinal Medical History: Negative for: Cirrhosis ; Colitis; Crohns; Hepatitis A; Hepatitis B; Hepatitis C Endocrine Medical History: Negative for: Type I Diabetes; Type II Diabetes Genitourinary Medical History: Negative for: End Stage Renal Disease Immunological Medical History: Negative for: Lupus Erythematosus; Raynauds; Scleroderma Integumentary (Skin) Medical History: Negative for: History of Burn Musculoskeletal Medical History: Negative for: Gout; Rheumatoid Arthritis; Osteoarthritis; Osteomyelitis Neurologic Medical History: Negative for: Dementia; Neuropathy; Quadriplegia; Paraplegia; Seizure Disorder Oncologic Medical History: Negative for: Received Chemotherapy; Received Radiation Psychiatric Medical History: Negative for: Anorexia/bulimia; Confinement Anxiety Immunizations Pneumococcal Vaccine: Received Pneumococcal Vaccination: No Implantable Devices None Family and Social History Cancer: No; Diabetes: Yes - Mother,Maternal Grandparents; Heart Disease: Yes -  Paternal Grandparents; Hereditary Spherocytosis: No; Hypertension: Yes - Mother,Maternal Grandparents; Kidney Disease: No; Lung Disease: Yes - Father,Paternal Grandparents; Seizures: No; Stroke: No; Thyroid Problems: No; Tuberculosis: No; Former smoker; Marital Status - Single; Alcohol Use: Never; Drug Use: No History;  Caffeine Use: Daily; Financial Concerns: No; Food, Clothing or Shelter Needs: No; Support System Lacking: No; Transportation Concerns: No Electronic Signature(s) Signed: 02/23/2021 10:45:13 AM By: [name] DO Signed: 02/23/2021 12:22:36 PM By: Baruch Gouty RN, BSN Entered By: [name] on 02/23/2021 10:40:00 -------------------------------------------------------------------------------- SuperBill Details Patient Name: Date of Service: [name], Kyliana D. 02/23/2021 Medical Record Number: [id number] Patient Account Number: [id number] Date of Birth/Sex: Treating RN: 01-12-1971 (50 y.o. Martyn Malay, Linda Primary Care Provider: [name] Other Clinician: Referring Provider: Treating Provider/Extender: Sammuel Bailiff in Treatment: 18 Diagnosis Coding ICD-10 Codes Code Description [phone] Non-pressure chronic ulcer of other part of left lower leg with unspecified severity I87.2 Venous insufficiency (chronic) (peripheral) I89.0 Lymphedema, not elsewhere classified Z86.718 Personal history of other venous thrombosis and embolism Facility Procedures CPT4 Code: [id number] Description: (Facility Use Only) [id number] - Faith BS[facility] LWR LT LEG Modifier: Quantity: 1 Physician Procedures : CPT4 Code Description Modifier [id number] - WC PHYS LEVEL 3 - EST PT ICD-10 Diagnosis Description L97.829 Non-pressure chronic ulcer of other part of left lower leg with unspecified severity I87.2 Venous insufficiency (chronic) (peripheral) I89.0  Lymphedema, not elsewhere classified Z86.718 Personal history of other venous thrombosis and embolism Quantity: 1 Electronic Signature(s) Signed: 02/23/2021 10:45:13 AM By: [name] DO Entered By: [name] on 02/23/2021 10:44:03

## 2021-02-23 NOTE — Progress Notes (Signed)
ANETRA, CZERWINSKI ([id number]) Visit Report for 02/23/2021 Arrival Information Details Patient Name: Date of Service: [name], [name] 02/23/2021 9:15 A M Medical Record Number: [id number] Patient Account Number: [id number] Date of Birth/Sex: Treating RN: 1971/02/24 (50 y.o. [name], [name] Primary Care Xzander Gilham: [name] Other Clinician: Referring Farida Mcreynolds: Treating Amarylis Rovito/Extender: Sammuel Bailiff in Treatment: 18 Visit Information History Since Last Visit Added or deleted any medications: No Patient Arrived: Ambulatory Any new allergies or adverse reactions: No Arrival Time: 09:30 Had a fall or experienced change in No Accompanied By: Self activities of daily living that may affect Transfer Assistance: None risk of falls: Patient Identification Verified: Yes Signs or symptoms of abuse/neglect since last visito No Secondary Verification Process Completed: Yes Hospitalized since last visit: No Patient Requires Transmission-Based Precautions: No Implantable device outside of the clinic excluding No Patient Has Alerts: Yes cellular tissue based products placed in the center Patient Alerts: ABI's: 11/21 L:1.17 since last visit: Has Dressing in Place as Prescribed: Yes Pain Present Now: Yes Electronic Signature(s) Signed: 02/23/2021 12:29:57 PM By: Dellie Catholic RN Entered By: Dellie Catholic on 02/23/2021 09:34:30 -------------------------------------------------------------------------------- Compression Therapy Details Patient Name: Date of Service: [name] 02/23/2021 9:15 A M Medical Record Number: [id number] Patient Account Number: [id number] Date of Birth/Sex: Treating RN: 1970-11-04 (50 y.o. [name] Primary Care Allice Garro: [name] Other Clinician: Referring Lina Hitch: Treating Deion Swift/Extender: Sammuel Bailiff in Treatment: 18 Compression Therapy Performed for Wound  Assessment: Wound #4 Left,Medial Lower Leg Performed By: Clinician Baruch Gouty, RN Compression Type: Three Layer Post Procedure Diagnosis Same as Pre-procedure Electronic Signature(s) Signed: 02/23/2021 12:22:36 PM By: Baruch Gouty RN, BSN Entered By: Baruch Gouty on 02/23/2021 10:27:29 -------------------------------------------------------------------------------- Lower Extremity Assessment Details Patient Name: Date of Service: [name] 02/23/2021 9:15 A M Medical Record Number: [id number] Patient Account Number: [id number] Date of Birth/Sex: Treating RN: [birth year] (50 y.o. [name] Primary Care Nechuma Boven: [name] Other Clinician: Referring Aedyn Kempfer: Treating Jhovani Griswold/Extender: Sammuel Bailiff in Treatment: 18 Edema Assessment Assessed: [name]: No] [name]: No] Edema: [Left: Ye] [Right: s] Calf Left: Right: Point of Measurement: 25 cm From Medial Instep 44 cm Ankle Left: Right: Point of Measurement: 9 cm From Medial Instep 23.5 cm Vascular Assessment Pulses: Dorsalis Pedis Palpable: [Left:Yes] Electronic Signature(s) Signed: 02/23/2021 12:22:36 PM By: Baruch Gouty RN, BSN Entered By: Baruch Gouty on 02/23/2021 09:57:08 -------------------------------------------------------------------------------- Multi Wound Chart Details Patient Name: Date of Service: [name], [name] 02/23/2021 9:15 A M Medical Record Number: [id number] Patient Account Number: [id number] Date of Birth/Sex: Treating RN: 1971/04/07 (50 y.o. [name] Primary Care [name]: [name] Other Clinician: Referring Atzin Buchta: Treating [name]/Extender: Sammuel Bailiff in Treatment: 18 Vital Signs Height(in): 67 Capillary Blood Glucose(mg/dl): 185 Weight(lbs): 346 Pulse(bpm): 97 Body Mass Index(BMI): 87 Blood Pressure(mmHg): 126/74 Temperature(F): 97.9 Respiratory  Rate(breaths/min): 20 Photos: [N/A:N/A] Left, Medial Lower Leg N/A N/A Wound Location: Gradually Appeared N/A N/A Wounding Event: Venous Leg Ulcer N/A N/A Primary Etiology: Sleep Apnea, Congestive Heart N/A N/A Comorbid History: Failure, Coronary Artery Disease, Hypertension 09/26/2020 N/A N/A Date Acquired: 18 N/A N/A Weeks of Treatment: Open N/A N/A Wound Status: 2.5x3.5x0.1 N/A N/A Measurements L x W x D (cm) 6.872 N/A N/A A (cm) : rea 0.687 N/A N/A Volume (cm) : 57.80% N/A N/A % Reduction in Area: 57.80% N/A N/A % Reduction in Volume: Full Thickness Without Exposed N/A N/A Classification: Support Structures Medium N/A  N/A Exudate Amount: Serosanguineous N/A N/A Exudate Type: red, brown N/A N/A Exudate Color: Distinct, outline attached N/A N/A Wound Margin: Large (67-100%) N/A N/A Granulation Amount: Red N/A N/A Granulation Quality: Small (1-33%) N/A N/A Necrotic Amount: Fat Layer (Subcutaneous Tissue): Yes N/A N/A Exposed Structures: Fascia: No Tendon: No Muscle: No Joint: No Bone: No Small (1-33%) N/A N/A Epithelialization: Compression Therapy N/A N/A Procedures Performed: Treatment Notes Electronic Signature(s) Signed: 02/23/2021 10:45:13 AM By: [name] DO Signed: 02/23/2021 12:22:36 PM By: Baruch Gouty RN, BSN Entered By: [name] on 02/23/2021 10:38:48 -------------------------------------------------------------------------------- Multi-Disciplinary Care Plan Details Patient Name: Date of Service: [name] 02/23/2021 9:15 A M Medical Record Number: [id number] Patient Account Number: [id number] Date of Birth/Sex: Treating RN: [birth year] (50 y.o. [name] Primary Care [name]: [name] Other Clinician: Referring [name]: Treating Phyliss Hulick/Extender: Sammuel Bailiff in Treatment: 18 Multidisciplinary Care Plan reviewed with physician Active Inactive Venous Leg  Ulcer Nursing Diagnoses: Actual venous Insuffiency (use after diagnosis is confirmed) Knowledge deficit related to disease process and management Potential for venous Insuffiency (use before diagnosis confirmed) Goals: Patient will maintain optimal edema control Date Initiated: 10/20/2020 Target Resolution Date: 03/23/2021 Goal Status: Active Interventions: Assess peripheral edema status every visit. Compression as ordered Treatment Activities: Therapeutic compression applied : 10/20/2020 Notes: 12/28/20: Edema control ongoing. Wound/Skin Impairment Nursing Diagnoses: Impaired tissue integrity Knowledge deficit related to ulceration/compromised skin integrity Goals: Patient/caregiver will verbalize understanding of skin care regimen Date Initiated: 10/20/2020 Target Resolution Date: 03/23/2021 Goal Status: Active Ulcer/skin breakdown will have a volume reduction of 30% by week 4 Date Initiated: 10/20/2020 Date Inactivated: 12/14/2020 Target Resolution Date: 11/24/2020 Goal Status: Met Ulcer/skin breakdown will have a volume reduction of 80% by week 12 Date Initiated: 12/14/2020 Date Inactivated: 01/23/2021 Target Resolution Date: 01/18/2021 Goal Status: Met Ulcer/skin breakdown will heal within 14 weeks Date Initiated: 01/23/2021 Date Inactivated: 02/23/2021 Target Resolution Date: 02/23/2021 Goal Status: Unmet Unmet Reason: infection Interventions: Assess patient/caregiver ability to obtain necessary supplies Assess patient/caregiver ability to perform ulcer/skin care regimen upon admission and as needed Assess ulceration(s) every visit Provide education on ulcer and skin care Treatment Activities: Skin care regimen initiated : 10/20/2020 Topical wound management initiated : 10/20/2020 Notes: Electronic Signature(s) Signed: 02/23/2021 12:22:36 PM By: Baruch Gouty RN, BSN Entered By: Baruch Gouty on 02/23/2021  09:58:54 -------------------------------------------------------------------------------- Pain Assessment Details Patient Name: Date of Service: [name], Maddie D. 02/23/2021 9:15 A M Medical Record Number: [id number] Patient Account Number: [id number] Date of Birth/Sex: Treating RN: [birth year] (50 y.o. [name] Primary Care Temitope Griffing: [name] Other Clinician: Referring Amarionna Arca: Treating Monchel Pollitt/Extender: Sammuel Bailiff in Treatment: 18 Active Problems Location of Pain Severity and Description of Pain Patient Has Paino Yes Site Locations Pain Location: Pain in Ulcers With Dressing Change: Yes Duration of the Pain. Constant / Intermittento Constant Rate the pain. Current Pain Level: 6 Worst Pain Level: 9 Least Pain Level: 6 Character of Pain Describe the Pain: Other: itching Pain Management and Medication Current Pain Management: Medication: Yes Is the Current Pain Management Adequate: Adequate How does your wound impact your activities of daily livingo Sleep: Yes Bathing: No Appetite: No Relationship With Others: No Bladder Continence: No Emotions: No Bowel Continence: No Work: No Toileting: No Drive: No Dressing: No Hobbies: No Electronic Signature(s) Signed: 02/23/2021 12:22:36 PM By: Baruch Gouty RN, BSN Entered By: Baruch Gouty on 02/23/2021 09:56:22 -------------------------------------------------------------------------------- Patient/Caregiver Education Details Patient Name: Date of Service: [name], Arlisha D.  10/28/2022andnbsp9:15 A M Medical Record Number: [id number] Patient Account Number: [id number] Date of Birth/Gender: Treating RN: 1970/10/06 (50 y.o. [name] Primary Care Physician: [name] Other Clinician: Referring Physician: Treating Physician/Extender: Sammuel Bailiff in Treatment: 18 Education Assessment Education Provided  To: Patient Education Topics Provided Venous: Methods: Explain/Verbal Responses: Reinforcements needed, State content correctly Wound/Skin Impairment: Methods: Explain/Verbal Responses: Reinforcements needed, State content correctly Electronic Signature(s) Signed: 02/23/2021 12:22:36 PM By: Baruch Gouty RN, BSN Entered By: Baruch Gouty on 02/23/2021 09:59:18 -------------------------------------------------------------------------------- Wound Assessment Details Patient Name: Date of Service: [name] 02/23/2021 9:15 A M Medical Record Number: [id number] Patient Account Number: [id number] Date of Birth/Sex: Treating RN: 08/06/1970 (50 y.o. [name] Primary Care Asheton Scheffler: [name] Other Clinician: Referring Ahnesti Townsend: Treating Lafreda Casebeer/Extender: Sammuel Bailiff in Treatment: 18 Wound Status Wound Number: 4 Primary Venous Leg Ulcer Etiology: Wound Location: Left, Medial Lower Leg Wound Open Wounding Event: Gradually Appeared Status: Date Acquired: 09/26/2020 Comorbid Sleep Apnea, Congestive Heart Failure, Coronary Artery Weeks Of Treatment: 18 History: Disease, Hypertension Clustered Wound: No Photos Wound Measurements Length: (cm) 2.5 Width: (cm) 3.5 Depth: (cm) 0.1 Area: (cm) 6.872 Volume: (cm) 0.687 % Reduction in Area: 57.8% % Reduction in Volume: 57.8% Epithelialization: Small (1-33%) Tunneling: No Undermining: No Wound Description Classification: Full Thickness Without Exposed Support Structures Wound Margin: Distinct, outline attached Exudate Amount: Medium Exudate Type: Serosanguineous Exudate Color: red, brown Foul Odor After Cleansing: No Slough/Fibrino No Wound Bed Granulation Amount: Large (67-100%) Exposed Structure Granulation Quality: Red Fascia Exposed: No Necrotic Amount: Small (1-33%) Fat Layer (Subcutaneous Tissue) Exposed: Yes Necrotic Quality: Adherent Slough Tendon Exposed:  No Muscle Exposed: No Joint Exposed: No Bone Exposed: No Electronic Signature(s) Signed: 02/23/2021 12:22:36 PM By: Baruch Gouty RN, BSN Entered By: Baruch Gouty on 02/23/2021 09:57:53 -------------------------------------------------------------------------------- Vitals Details Patient Name: Date of Service: [name], Valmai D. 02/23/2021 9:15 A M Medical Record Number: [id number] Patient Account Number: [id number] Date of Birth/Sex: Treating RN: 05/07/1970 (50 y.o. [name] Primary Care Erasmus Bistline: [name] Other Clinician: Referring Asaad Gulley: Treating [place] Basham/Extender: Sammuel Bailiff in Treatment: 18 Vital Signs Time Taken: 09:34 Temperature (F): 97.9 Height (in): 67 Pulse (bpm): 97 Weight (lbs): 346 Respiratory Rate (breaths/min): 20 Body Mass Index (BMI): 54.2 Blood Pressure (mmHg): 126/74 Capillary Blood Glucose (mg/dl): 185 Reference Range: 80 - 120 mg / dl Electronic Signature(s) Signed: 02/23/2021 12:29:57 PM By: Dellie Catholic RN Entered By: Dellie Catholic on 02/23/2021 09:34:59

## 2021-02-26 ENCOUNTER — Telehealth: Payer: Self-pay | Admitting: Hematology

## 2021-02-26 NOTE — Telephone Encounter (Signed)
Left message with follow-up appointments per 10/27 los. 

## 2021-02-28 ENCOUNTER — Encounter: Payer: Self-pay | Admitting: Hematology

## 2021-02-28 NOTE — Progress Notes (Addendum)
HEMATOLOGY/ONCOLOGY CLINIC NOTE  Date of Service: 02/28/2021  Patient Care Team: Ladell Pier, MD as PCP - General (Internal Medicine) Leonie Man, MD as PCP - Cardiology (Cardiology)  CHIEF COMPLAINTS/PURPOSE OF CONSULTATION:  Anemia  HISTORY OF PRESENTING ILLNESS:   [name] is a wonderful 50 y.o. female who has been referred to [place] by Dr. [name] for evaluation and management of anemia. The pt reports that she is doing well overall.   The pt reports that she has had anemia since birth but is unaware of any genetic condition causing her anemia. She received IV iron for about 4 years but stopped 2 years ago when she moved to [place]. Pt had a Port-a-Cath placed for repeat IV Iron Infusions due to issues accessing her veins.   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 She has been on anticoagulation for nearly 10 years and denies any repeat blood clots since starting Eliquis. Her PCP is currently managing her anticoagulation. 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 The ulcer tends to heal in the winter and reopen in the summer. It is currently healed with no open area.  Pt had a Colonoscopy and EGD at [facility] in [place] in August of 2021 that were benign. She was experiencing bloody stools frequently but hasn't had any after her 2021 GI studies. She has discontinued Aspirin due to concerns for excessive blood loss.   Pt has had a bilateral oophorectomies. She does not recall being diagnosed with PCOS. She denies gluten intolerance or any thyroid dysfunction. Pt had a fall in 2019 that is causing ongong back pain. She was seen after the fall and was found to have injury at L1 & L2. She has been given Tylenol for pain management. Pt has a history of chronic acid suppression. She had stopped but recently restarted Prilosec. She was Vitamin  B12 deficient for about one year. Pt had a heart attack in but denies any history of liver disease. Pt has had her COVID19 vaccines and booster.  Most recent lab results  of CBC w/diff is as follows: all values are WNL except for RBC at 3.80, Hgb at 10.1, HCT at 35.3, MCHC at 28.6, RDW at 16.2, PLT at 148K.  On review of systems, pt reports fatigue and denies nose bleeds, gum bleeds, bloody stools, heavy menstrual losses, fevers, unexpected weight loss, chills, new back pain, abdominal pain and any other symptoms.   On PMHx the pt reports B/L Oophorectomy, Myocardial infarction, DVT, Iron deficiency anemia due to chronic blood loss.  INTERVAL HISTORY   [name] is a wonderful 50 y.o. female who is here today for f/u regarding evaluation and management of anemia. The patient's last visit with [place] was on 08/23/2020.   Patient notes she is doing well overall.  No overt GI bleeding noted.  No epistaxis.  No other source of overt bleeding. Lab results today 02/22/2021 show CBC with a hemoglobin of 11.1 MCV of 88.1 WBC count of 5.4 k and platelets of 162 k CMP within normal limits Ferritin is down to 24 from 111 Iron saturation down to 12% from 20%.  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   Morbid obesity due to excess calories (HCC)    Osteoarthritis of left knee    Prediabetes    Small bowel obstruction ([place])    as a child   Speech impediment    Stutter / stammer    SURGICAL HISTORY: Past Surgical History:  Procedure Laterality Date   ABDOMINAL WALL DEFECT REPAIR  1970   IR CV LINE INJECTION  10/24/2020   IVC FILTER INSERTION  2017   [place]   PORTACATH PLACEMENT  2014    SOCIAL HISTORY: Social History   Socioeconomic History   Marital status: Single    Spouse name: Not on file   Number of children: 0   Years of education: Not on file   Highest education level: 12th grade  Occupational History   Occupation: unemployed on disablity  Tobacco Use   Smoking status: Former   Smokeless tobacco: Never  Scientific laboratory technician Use: Never used  Substance and Sexual Activity   Alcohol use: Not Currently   Drug use: Not Currently   Sexual activity: Not Currently  Other Topics Concern   Not on file  Social History Narrative   Not on file   Social Determinants of Health   Financial Resource Strain: Not on file  Food Insecurity: No Food Insecurity   Worried About Charity fundraiser in the Last Year: Never true   [place] in the Last Year: Never true  Transportation Needs: No Transportation Needs   Lack of Transportation (Medical): No   Lack of Transportation (Non-Medical): No  Physical Activity: Not on file  Stress: Not on file  Social Connections: Not on file  Intimate Partner Violence: Not on file    FAMILY HISTORY: Family History  Problem Relation Age of Onset   Diabetes Mellitus II Mother    COPD Father    Diabetes Father    Diabetes Mellitus II Maternal Grandmother    Breast cancer Paternal Grandfather     ALLERGIES:  is allergic to ace inhibitors, aspirin, hydromorphone, vancomycin, contrast media [iodinated diagnostic agents], and dilaudid [hydromorphone  hcl].  MEDICATIONS:  Current Outpatient Medications  Medication Sig Dispense Refill   Accu-Chek Softclix Lancets lancets Use as instructed 100 each 12   acetaminophen (TYLENOL) 500 MG tablet Take 1 tablet (500 mg total) by mouth every 6 (six) hours as needed. 60 tablet 0   albuterol (PROAIR HFA) 108 (90 Base) MCG/ACT inhaler INHALE TWO PUFFS BY MOUTH EVERY 6 HOURS AS NEEDED FOR WHEEZING OR SHORTNESS OF BREATH 8.5 g 0   amLODipine (NORVASC) 5 MG tablet Take 0.5 tablets (2.5 mg total) by mouth daily. 30 tablet 2   apixaban (ELIQUIS) 5 MG TABS tablet Take 1 tablet (5 mg total) by mouth 2 (two) times daily. 60 tablet 3   atorvastatin (LIPITOR) 40 MG tablet  Take 1 tablet (40 mg total) by mouth daily. 90 tablet 1   Blood Glucose Calibration (ACCU-CHEK GUIDE CONTROL) LIQD 1 each by In Vitro route daily. 1 each 4   Blood Glucose Monitoring Suppl (ACCU-CHEK GUIDE) w/Device KIT 1 each by Does not apply route 2 (two) times daily. 1 kit 0   Carboxymethylcellulose Sodium (EYE DROPS OP) Place 1 drop into both eyes daily as needed (dry eyes).     carvedilol (COREG) 25 MG tablet TAKE ONE TABLET BY MOUTH TWICE A DAY WITH MEALS 60 tablet 4   cholecalciferol (VITAMIN D3) 25 MCG (1000 UNIT) tablet Take 1 tablet (1,000 Units total) by mouth daily. 30 tablet 5   dapagliflozin propanediol (FARXIGA) 10 MG TABS tablet Take 1 tablet (10 mg total) by mouth daily. 30 tablet 6   doxycycline (VIBRA-TABS) 100 MG tablet Take 1 tablet (100 mg total) by mouth 2 (two) times daily. 20 tablet 0   furosemide (LASIX) 80 MG tablet Take 1 tablet (80 mg total) by mouth daily. 30 tablet 4   gabapentin (NEURONTIN) 100 MG capsule Take 1 capsule (100 mg total) by mouth 3 (three) times daily as needed (pain). 90 capsule 2   glucose blood test strip Use as instructed 100 each 12   hydrocortisone cream 1 % Apply 1 application topically daily as needed for itching.     KLOR-CON M20 20 MEQ tablet TAKE ONE TABLET BY MOUTH DAILY 30 tablet 0    methocarbamol (ROBAXIN) 500 MG tablet TAKE ONE TABLET BY MOUTH DAILY AS NEEDED FOR MUSCLE SPASMS 60 tablet 0   mometasone-formoterol (DULERA) 200-5 MCG/ACT AERO Inhale 2 puffs into the lungs 2 (two) times daily. 1 each 2   nitroGLYCERIN (NITROSTAT) 0.4 MG SL tablet Place 1 tablet (0.4 mg total) under the tongue every 5 (five) minutes as needed for chest pain. 30 tablet 0   omeprazole (PRILOSEC) 20 MG capsule Take 20 mg by mouth daily as needed (acid reflux).     PARoxetine (PAXIL) 10 MG tablet TAKE ONE TABLET BY MOUTH DAILY 30 tablet 0   spironolactone (ALDACTONE) 25 MG tablet Take 1 tablet (25 mg total) by mouth daily. 30 tablet 4   traMADol (ULTRAM) 50 MG tablet Take 1 tablet (50 mg total) by mouth every 6 (six) hours as needed for moderate pain. 14 tablet 0   valsartan (DIOVAN) 160 MG tablet Take 1 tablet (160 mg total) by mouth daily. 90 tablet 3   vitamin B-12 (CYANOCOBALAMIN) 1000 MCG tablet Take 1,000 mcg by mouth daily.     No current facility-administered medications for this visit.    REVIEW OF SYSTEMS:   .10 Point review of Systems was done is negative except as noted above.  PHYSICAL EXAMINATION: ECOG PERFORMANCE STATUS: 2 - Symptomatic, <50% confined to bed  . Vitals:   02/22/21 1425  BP: 116/74  Pulse: (!) 20  Resp: 20  Temp: (!) 97.3 F (36.3 C)  SpO2: 97%   Filed Weights   02/22/21 1425  Weight: (!) 337 lb 3.2 oz (153 kg)   .Body mass index is 61.67 kg/m.  [name] GENERAL:alert, in no acute distress and comfortable SKIN: no acute rashes, no significant lesions EYES: conjunctiva are pink and non-injected, sclera anicteric OROPHARYNX: MMM, no exudates, no oropharyngeal erythema or ulceration NECK: supple, no JVD LYMPH:  no palpable lymphadenopathy in the cervical, axillary or inguinal regions LUNGS: clear to auscultation b/l with normal respiratory effort HEART: regular rate & rhythm ABDOMEN:  normoactive bowel sounds ,  non tender, not distended. Extremity: no  pedal edema PSYCH: alert & oriented x 3 with fluent speech NEURO: no focal motor/sensory deficits   LABORATORY DATA:  I have reviewed the data as listed  . CBC Latest Ref Rng & Units 02/22/2021 11/29/2020 11/25/2020  WBC 4.0 - 10.5 K/uL 5.4 8.6 11.1(H)  Hemoglobin 12.0 - 15.0 g/dL 11.1(L) 10.9(L) 10.4(L)  Hematocrit 36.0 - 46.0 % 38.5 36.1 34.1(L)  Platelets 150 - 400 K/uL 162 165 160   . CBC    Component Value Date/Time   WBC 5.4 02/22/2021 1341   WBC 11.1 (H) 11/25/2020 0425   RBC 4.37 02/22/2021 1341   HGB 11.1 (L) 02/22/2021 1341   HGB 10.9 (L) 11/29/2020 1445   HCT 38.5 02/22/2021 1341   HCT 36.1 11/29/2020 1445   PLT 162 02/22/2021 1341   PLT 165 11/29/2020 1445   MCV 88.1 02/22/2021 1341   MCV 88 11/29/2020 1445   MCH 25.4 (L) 02/22/2021 1341   MCHC 28.8 (L) 02/22/2021 1341   RDW 14.9 02/22/2021 1341   RDW 13.1 11/29/2020 1445   LYMPHSABS 0.9 02/22/2021 1341   LYMPHSABS 0.5 (L) 11/29/2020 1445   MONOABS 0.5 02/22/2021 1341   EOSABS 0.1 02/22/2021 1341   EOSABS 0.0 11/29/2020 1445   BASOSABS 0.0 02/22/2021 1341   BASOSABS 0.0 11/29/2020 1445    . CMP Latest Ref Rng & Units 02/22/2021 11/29/2020 11/27/2020  Glucose 70 - 99 mg/dL 111(H) 208(H) 263(H)  BUN 6 - 20 mg/dL 12 12 33(H)  Creatinine 0.44 - 1.00 mg/dL 0.98 0.77 0.96  Sodium 135 - 145 mmol/L 139 138 131(L)  Potassium 3.5 - 5.1 mmol/L 4.3 4.6 4.0  Chloride 98 - 111 mmol/L 101 96 90(L)  CO2 22 - 32 mmol/L 27 29 32  Calcium 8.9 - 10.3 mg/dL 9.5 9.4 9.3  Total Protein 6.5 - 8.1 g/dL 8.5(H) 7.0 -  Total Bilirubin 0.3 - 1.2 mg/dL 0.9 0.7 -  Alkaline Phos 38 - 126 U/L 78 77 -  AST 15 - 41 U/L 15 10 -  ALT 0 - 44 U/L 9 9 -   . Lab Results  Component Value Date   IRON 48 02/22/2021   TIBC 399 02/22/2021   IRONPCTSAT 12 (L) 02/22/2021   (Iron and TIBC)  Lab Results  Component Value Date   FERRITIN 24 02/22/2021    RADIOGRAPHIC STUDIES: I have personally reviewed the radiological images as listed and  agreed with the findings in the report. No results found.  ASSESSMENT & PLAN:   50 yo with   1) Chronic iron deficiency anemia requiring recurrent IV Iron infusions. Likely from chronic GI losses and previously due to blood loss from venous stasis ulcer in the setting of chronic anticoagulation. 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  - Dropping ferritin and iron saturation would recommend 1 dose of Injectafer 750 mg to maintain her iron stores to avoid worsening anemia. -Recommended pt take a Vitamin B-Complex daily. -Will see back in 6 months with labs.    FOLLOW UP: Portflush in 3 months RTC with Dr Irene Limbo with port flush and labs in 6  months   No orders of the defined types were placed in this encounter.   All of the patients questions were answered with apparent satisfaction. The patient knows to call the clinic with any problems, questions or concerns. . The total time spent in the appointment was 20 minutes and more than 50% was on counseling and direct patient cares.     Sullivan Lone MD [place] AAHIVMS [facility] Hematology/Oncology Physician [facility]

## 2021-03-01 ENCOUNTER — Ambulatory Visit: Payer: Medicaid Other | Attending: Internal Medicine | Admitting: Internal Medicine

## 2021-03-01 ENCOUNTER — Other Ambulatory Visit: Payer: Self-pay

## 2021-03-01 ENCOUNTER — Encounter: Payer: Self-pay | Admitting: Hematology

## 2021-03-01 ENCOUNTER — Telehealth: Payer: Self-pay | Admitting: Student

## 2021-03-01 ENCOUNTER — Encounter: Payer: Self-pay | Admitting: Internal Medicine

## 2021-03-01 VITALS — BP 107/75 | HR 73 | Resp 16 | Wt 339.0 lb

## 2021-03-01 DIAGNOSIS — D500 Iron deficiency anemia secondary to blood loss (chronic): Secondary | ICD-10-CM

## 2021-03-01 DIAGNOSIS — Z23 Encounter for immunization: Secondary | ICD-10-CM | POA: Diagnosis not present

## 2021-03-01 DIAGNOSIS — G4733 Obstructive sleep apnea (adult) (pediatric): Secondary | ICD-10-CM

## 2021-03-01 DIAGNOSIS — N950 Postmenopausal bleeding: Secondary | ICD-10-CM | POA: Diagnosis not present

## 2021-03-01 MED ORDER — FERROUS SULFATE 325 (65 FE) MG PO TABS
325.0000 mg | ORAL_TABLET | Freq: Every day | ORAL | 0 refills | Status: DC
Start: 2021-03-01 — End: 2021-06-06

## 2021-03-01 NOTE — Telephone Encounter (Signed)
I called she reports that due to her lack of transportation to the office she wants to do an in lab study. Please advise if ok to change.

## 2021-03-01 NOTE — Addendum Note (Signed)
Addended by: Sullivan Lone on: 03/01/2021 12:21 AM   Modules accepted: Orders

## 2021-03-01 NOTE — Progress Notes (Signed)
Patient ID: [name], female    DOB: 07-07-1970  MRN: [id number]  CC: Medication Refill and Dizziness   Subjective: [name] is a 50 y.o. 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  EGD and colonoscopy negative, chronic ulcer left lower extremity, asthma  Anemia: feeling light headed and tired.  She tells me that [name] had done a CBC and iron levels on her recently and her iron level was found to be low.  She is wondering whether she needs to go to the emergency room to have a blood transfusion.  I see in the chart where [name] office has been trying to reach her to have her come in for an iron infusion.  Has not had sleep study as yet.  She states that she would lie on her mother to drive but her mother has glaucoma and is unable to drive at nights.  She will try to have her brother take her to the appointment or we will use Uber.  Await  approval from insurance for sleep study  Wants GYN referral.  Thinks she needs D&C Gets "bleeding here and there."  Last bleeding in June of this yr. she had seen the gynecologist [name] in January of this year and had a normal Pap. Gave conflicting history on that visit of when her last cycle was.  She had told the nurse that it was 8 years ago but then told [name] something different.   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 60 tablet 0   albuterol (PROAIR HFA) 108 (90 Base) MCG/ACT inhaler INHALE TWO PUFFS BY MOUTH EVERY 6 HOURS AS NEEDED FOR WHEEZING OR SHORTNESS OF BREATH 8.5 g 0   amLODipine (NORVASC) 5 MG tablet Take 0.5 tablets (2.5 mg total) by mouth daily. 30 tablet 2   apixaban (ELIQUIS) 5 MG TABS tablet Take 1 tablet (5 mg total) by mouth 2 (two) times daily. 60 tablet 3   atorvastatin (LIPITOR) 40 MG tablet Take 1 tablet (40 mg total) by mouth daily. 90 tablet 1   Blood Glucose Calibration (ACCU-CHEK GUIDE CONTROL) LIQD 1 each by  In Vitro route daily. 1 each 4   Blood Glucose Monitoring Suppl (ACCU-CHEK GUIDE) w/Device KIT 1 each by Does not apply route 2 (two) times daily. 1 kit 0   Carboxymethylcellulose Sodium (EYE DROPS OP) Place 1 drop into both eyes daily as needed (dry eyes).     carvedilol (COREG) 25 MG tablet TAKE ONE TABLET BY MOUTH TWICE A DAY WITH MEALS 60 tablet 4   cholecalciferol (VITAMIN D3) 25 MCG (1000 UNIT) tablet Take 1 tablet (1,000 Units total) by mouth daily. 30 tablet 5   dapagliflozin propanediol (FARXIGA) 10 MG TABS tablet Take 1 tablet (10 mg total) by mouth daily. 30 tablet 6   furosemide (LASIX) 80 MG  tablet Take 1 tablet (80 mg total) by mouth daily. 30 tablet 4   gabapentin (NEURONTIN) 100 MG capsule Take 1 capsule (100 mg total) by mouth 3 (three) times daily as needed (pain). 90 capsule 2   glucose blood test strip Use as instructed 100 each 12   hydrocortisone cream 1 % Apply 1 application topically daily as needed for itching.     KLOR-CON M20 20 MEQ tablet TAKE ONE TABLET BY MOUTH DAILY 30 tablet 0   mometasone-formoterol (DULERA) 200-5 MCG/ACT AERO Inhale 2 puffs into the lungs 2 (two) times daily. 1 each 2   nitroGLYCERIN (NITROSTAT) 0.4 MG SL tablet Place 1 tablet (0.4 mg total) under the tongue every 5 (five) minutes as needed for chest pain. 30 tablet 0   omeprazole (PRILOSEC) 20 MG capsule Take 20 mg by mouth daily as needed (acid reflux).     PARoxetine (PAXIL) 10 MG tablet TAKE ONE TABLET BY MOUTH DAILY 30 tablet 0   spironolactone (ALDACTONE) 25 MG tablet Take 1 tablet (25 mg total) by mouth daily. 30 tablet 4   valsartan (DIOVAN) 160 MG tablet Take 1 tablet (160 mg total) by mouth daily. 90 tablet 3   vitamin B-12 (CYANOCOBALAMIN) 1000 MCG tablet Take 1,000 mcg by mouth daily.     doxycycline (VIBRA-TABS) 100 MG tablet Take 1 tablet (100 mg total) by mouth 2 (two) times daily. (Patient not taking: Reported on 03/01/2021) 20 tablet 0   methocarbamol (ROBAXIN) 500 MG tablet TAKE ONE TABLET BY MOUTH DAILY AS NEEDED FOR MUSCLE SPASMS (Patient not taking: Reported on 03/01/2021) 60 tablet 0   traMADol (ULTRAM) 50 MG tablet Take 1 tablet (50 mg total) by mouth every 6 (six) hours as needed for moderate pain. (Patient not taking: Reported on 03/01/2021) 14 tablet 0   No current facility-administered medications on file prior to visit.    Allergies  Allergen Reactions   Ace Inhibitors Rash and Other (See Comments)    Make pt bleed   Aspirin Other (See Comments)    Per patient paperwork: blood clot?  Likely because of chronic DOAC   Hydromorphone Hives and Itching   Vancomycin  Itching and Rash   Contrast Media [Iodinated Diagnostic Agents] Hives   Dilaudid [Hydromorphone Hcl] Hives    Social History   Socioeconomic History   Marital status: Single    Spouse name: Not on file   Number of children: 0   Years of education: Not on file   Highest education level: 12th grade  Occupational History   Occupation: unemployed on disablity  Tobacco Use   Smoking status: Former   Smokeless tobacco: Never  Scientific laboratory technician Use: Never used  Substance and Sexual Activity   Alcohol use: Not Currently   Drug use: Not  Currently   Sexual activity: Not Currently  Other Topics Concern   Not on file  Social History Narrative   Not on file   Social Determinants of Health   Financial Resource Strain: Not on file  Food Insecurity: No Food Insecurity   Worried About Running Out of Food in the Last Year: Never true   Ran Out of Food in the Last Year: Never true  Transportation Needs: No Transportation Needs   Lack of Transportation (Medical): No   Lack of Transportation (Non-Medical): No  Physical Activity: Not on file  Stress: Not on file  Social Connections: Not on file  Intimate Partner Violence: Not on file    Family History  Problem Relation Age of Onset   Diabetes Mellitus II Mother    COPD Father    Diabetes Father    Diabetes Mellitus II Maternal Grandmother    Breast cancer Paternal Grandfather     Past Surgical History:  Procedure Laterality Date   ABDOMINAL WALL DEFECT REPAIR  1970   IR CV LINE INJECTION  10/24/2020   IVC FILTER INSERTION  2017   [place] PLACEMENT  2014    ROS: Review of Systems Negative except as stated above  PHYSICAL EXAM: BP 107/75   Pulse 73   Resp 16   Wt (!) 339 lb (153.8 kg)   SpO2 95%   BMI 62.00 kg/m   Physical Exam  General appearance - alert, well appearing, morbidly obese middle-age African-American female and in no distress Mental status -patient with  odd affect and poor historian Chest - clear to auscultation, no wheezes, rales or rhonchi, symmetric air entry Heart - normal rate, regular rhythm, normal S1, S2, no murmurs, rubs, clicks or gallops  Lab Results  Component Value Date   IRON 48 02/22/2021   TIBC 399 02/22/2021   FERRITIN 24 02/22/2021    CMP Latest Ref Rng & Units 02/22/2021 11/29/2020 11/27/2020  Glucose 70 - 99 mg/dL 111(H) 208(H) 263(H)  BUN 6 - 20 mg/dL 12 12 33(H)  Creatinine 0.44 - 1.00 mg/dL 0.98 0.77 0.96  Sodium 135 - 145 mmol/L 139 138 131(L)  Potassium 3.5 - 5.1 mmol/L 4.3 4.6 4.0  Chloride 98 - 111 mmol/L 101 96 90(L)  CO2 22 - 32 mmol/L 27 29 32  Calcium 8.9 - 10.3 mg/dL 9.5 9.4 9.3  Total Protein 6.5 - 8.1 g/dL 8.5(H) 7.0 -  Total Bilirubin 0.3 - 1.2 mg/dL 0.9 0.7 -  Alkaline Phos 38 - 126 U/L 78 77 -  AST 15 - 41 U/L 15 10 -  ALT 0 - 44 U/L 9 9 -   Lipid Panel  No results found for: CHOL, TRIG, HDL, CHOLHDL, VLDL, LDLCALC, LDLDIRECT  CBC    Component Value Date/Time   WBC 5.4 02/22/2021 1341   WBC 11.1 (H) 11/25/2020 0425   RBC 4.37 02/22/2021 1341   HGB 11.1 (L) 02/22/2021 1341   HGB 10.9 (L) 11/29/2020 1445   HCT 38.5 02/22/2021 1341   HCT 36.1 11/29/2020 1445   PLT 162 02/22/2021 1341   PLT 165 11/29/2020 1445   MCV 88.1 02/22/2021 1341   MCV 88 11/29/2020 1445   MCH 25.4 (L) 02/22/2021 1341   MCHC 28.8 (L) 02/22/2021 1341   RDW 14.9 02/22/2021 1341   RDW 13.1 11/29/2020 1445   LYMPHSABS 0.9 02/22/2021 1341   LYMPHSABS 0.5 (L) 11/29/2020 1445   MONOABS 0.5 02/22/2021 1341  EOSABS 0.1 02/22/2021 1341   EOSABS 0.0 11/29/2020 1445   BASOSABS 0.0 02/22/2021 1341   BASOSABS 0.0 11/29/2020 1445    ASSESSMENT AND PLAN: 1. Iron deficiency anemia due to chronic blood loss Advised patient to call [name] office back as they have been trying to reach her to schedule her for an iron infusion.  She requested oral iron tablets in the meantime. I do not think she needs to be seen in the  emergency room.  Last hemoglobin was stable around 11.  We will recheck CBC today. - ferrous sulfate 325 (65 FE) MG tablet; Take 1 tablet (325 mg total) by mouth daily with breakfast.  Dispense: 100 tablet; Refill: 0 - CBC  2. Postmenopausal bleeding - Ambulatory referral to Gynecology - US Pelvic Complete With Transvaginal; Future  3. Need for immunization against influenza - Flu Vaccine QUAD 70moIM (Fluarix, Fluzone & Alfiuria Quad PF)  4. Need for vaccination against Streptococcus pneumoniae - Pneumococcal conjugate vaccine 20-valent (Prevnar 20)   Patient was given the opportunity to ask questions.  Patient verbalized understanding of the plan and was able to repeat key elements of the plan.   Orders Placed This Encounter  Procedures   [place] Complete With Transvaginal   Flu Vaccine QUAD 639moM (Fluarix, Fluzone & Alfiuria Quad PF)   Pneumococcal conjugate vaccine 20-valent (Prevnar 20)   CBC   Ambulatory referral to Gynecology     Requested Prescriptions   Signed Prescriptions Disp Refills   ferrous sulfate 325 (65 FE) MG tablet 100 tablet 0    Sig: Take 1 tablet (325 mg total) by mouth daily with breakfast.    Return in about 4 months (around 06/29/2021).  DeKarle PlumberMD, FACP

## 2021-03-02 ENCOUNTER — Encounter (HOSPITAL_BASED_OUTPATIENT_CLINIC_OR_DEPARTMENT_OTHER): Payer: Medicaid Other | Attending: Internal Medicine | Admitting: Internal Medicine

## 2021-03-02 ENCOUNTER — Ambulatory Visit: Payer: Medicaid Other | Admitting: Dietician

## 2021-03-02 ENCOUNTER — Telehealth: Payer: Self-pay

## 2021-03-02 DIAGNOSIS — Z86718 Personal history of other venous thrombosis and embolism: Secondary | ICD-10-CM | POA: Diagnosis not present

## 2021-03-02 DIAGNOSIS — L97829 Non-pressure chronic ulcer of other part of left lower leg with unspecified severity: Secondary | ICD-10-CM | POA: Insufficient documentation

## 2021-03-02 DIAGNOSIS — I872 Venous insufficiency (chronic) (peripheral): Secondary | ICD-10-CM

## 2021-03-02 DIAGNOSIS — I87312 Chronic venous hypertension (idiopathic) with ulcer of left lower extremity: Secondary | ICD-10-CM | POA: Insufficient documentation

## 2021-03-02 DIAGNOSIS — I890 Lymphedema, not elsewhere classified: Secondary | ICD-10-CM | POA: Insufficient documentation

## 2021-03-02 LAB — CBC
Hematocrit: 36.3 % (ref 34.0–46.6)
Hemoglobin: 10.7 g/dL — ABNORMAL LOW (ref 11.1–15.9)
MCH: 25.2 pg — ABNORMAL LOW (ref 26.6–33.0)
MCHC: 29.5 g/dL — ABNORMAL LOW (ref 31.5–35.7)
MCV: 85 fL (ref 79–97)
Platelets: 177 10*3/uL (ref 150–450)
RBC: 4.25 x10E6/uL (ref 3.77–5.28)
RDW: 13.7 % (ref 11.7–15.4)
WBC: 6.3 10*3/uL (ref 3.4–10.8)

## 2021-03-02 NOTE — Progress Notes (Signed)
Megan, [name] ([id number]) Visit Report for 03/02/2021 Chief Complaint Document Details Patient Name: Date of Service: Megan, [name] D. 03/02/2021 9:15 A M Medical Record Number: [id number] Patient Account Number: [id number] Date of Birth/Sex: Treating RN: 02-09-1971 (50 y.o. [name] Primary Care Provider: [name] Other Clinician: Referring Provider: Treating Provider/Extender: Sammuel Bailiff in Treatment: 19 Information Obtained from: Patient Chief Complaint Left lower extremity wound Electronic Signature(s) Signed: 03/02/2021 10:46:31 AM By: [name] DO Entered By: [name] on 03/02/2021 10:43:45 -------------------------------------------------------------------------------- Debridement Details Patient Name: Date of Service: [name], [name] 03/02/2021 9:15 A M Medical Record Number: [id number] Patient Account Number: [id number] Date of Birth/Sex: Treating RN: [birth year] (50 y.o. [name] Primary Care Provider: [name] Other Clinician: Referring Provider: Treating Provider/Extender: Sammuel Bailiff in Treatment: 19 Debridement Performed for Assessment: Wound #4 Left,Medial Lower Leg Performed By: Physician [name], DO Debridement Type: Debridement Severity of Tissue Pre Debridement: Fat layer exposed Level of Consciousness (Pre-procedure): Awake and Alert Pre-procedure Verification/Time Out Yes - 10:18 Taken: Start Time: 10:18 T Area Debrided (L x W): otal 1.9 (cm) x 1.5 (cm) = 2.85 (cm) Tissue and other material debrided: Viable, Non-Viable, Slough, Subcutaneous, Slough Level: Skin/Subcutaneous Tissue Debridement Description: Excisional Instrument: Curette Bleeding: Minimum Hemostasis Achieved: Pressure End Time: 10:19 Procedural Pain: 0 Post Procedural Pain: 0 Response to Treatment: Procedure was tolerated well Level of Consciousness (Post- Awake  and Alert procedure): Post Debridement Measurements of Total Wound Length: (cm) 1.9 Width: (cm) 1.5 Depth: (cm) 0.1 Volume: (cm) 0.224 Character of Wound/Ulcer Post Debridement: Improved Severity of Tissue Post Debridement: Fat layer exposed Post Procedure Diagnosis Same as Pre-procedure Electronic Signature(s) Signed: 03/02/2021 10:46:31 AM By: [name] DO Signed: 03/02/2021 2:36:22 PM By: Levan Hurst RN, BSN Entered By: Levan Hurst on 03/02/2021 10:20:11 -------------------------------------------------------------------------------- HPI Details Patient Name: Date of Service: [name], [name] 03/02/2021 9:15 A M Medical Record Number: [id number] Patient Account Number: [id number] Date of Birth/Sex: Treating RN: 04/24/1971 (50 y.o. [name] Primary Care Provider: [name] Other Clinician: Referring Provider: Treating Provider/Extender: Sammuel Bailiff in Treatment: 19 History of Present Illness HPI Description: Admission 6/24 [name] with a past medical history of chronic venous insufficiency, lymphedema, DVT on anticoagulation and diastolic heart failure that presents to the clinic for left lower extremity wound. She was last seen 4 months ago in our clinic for the same issue. The reoccurring wound started at the end of May and has been going on for 1 month. She has been using an ointment and I am unclear what this is. She tries to keep her leg elevated with her compression stocking. She also reports she has lymphedema pumps and has been using them as well. She reports mild pain to the area. She denies signs of infection including increased warmth, erythema or purulent drainage. 7/1; patient presents for 1 week follow-up. She has tolerated the wrap well. Unfortunately she did have trouble with the wrap Sliding down her leg 2 days ago. She has no issues or complaints today. She denies signs  of infection. 7/15; patient presents for follow-up. She has tolerated the wrap well. She has no issues or complaints today. She denies signs of infection. 7/25; patient presents for 1 week follow-up. She has no issues or complaints today. She denies signs of infection. 8/4; patient presents for follow-up. She has tolerated the compression wrap well to her left  lower extremity. She states she was in the hospital last week due to fluid overload. She subsequently developed blisters to her bottom from being in the hospital bed for prolonged periods of time. These have since ruptured. She now has 2 areas of skin breakdown. She has been keeping gauze on them. She denies signs of infection. 8/18; patient presents for 2-week follow-up. She reports improvement to her buttocks wounds. She is able to tolerate the wrap well to her left lower extremity with no issues. She denies signs of infection. 9/1; patient presents for 2-week follow-up. She has no issues or complaints today. she denies signs of infection. 9/15; patient presents for 2-week follow-up. She has no issues or complaints today. She has tolerated the wrap well. She denies signs of infection. 9/27; patient presents for 1 week follow-up. She has no issues or complaints today. 10/20; patient presents for follow-up. She was on vacation for the past 3 weeks. She states she had the last clinic visit wrap in place for 1 week and then she took this off. She states that she has been using her compression wraps since. She states that the wound has gotten larger. She reports pain to the wound site. She denies fever/chills, nausea/vomiting. She denies purulent drainage. 10/24; patient presents for follow-up. She states she tolerated the Kerlix/Coban wrap well. She reports decrease in pain. She states she started Keflex. She denies systemic signs of infection. 10/28; patient presents for follow-up. She has no issues or complaints today. She denies acute pain.  She tolerated the wrap well. 11/4; patient presents for follow-up. She has no issues or complaints. She denies signs of infection. Electronic Signature(s) Signed: 03/02/2021 10:46:31 AM By: [name] DO Entered By: [name] on 03/02/2021 10:44:11 -------------------------------------------------------------------------------- Physical Exam Details Patient Name: Date of Service: Megan Session D. 03/02/2021 9:15 A M Medical Record Number: [id number] Patient Account Number: [id number] Date of Birth/Sex: Treating RN: 1971/04/16 (50 y.o. [name] Primary Care Provider: [name] Other Clinician: Referring Provider: Treating Provider/Extender: Sammuel Bailiff in Treatment: 19 Constitutional respirations regular, non-labored and within target range for patient.. Cardiovascular 2+ dorsalis pedis/posterior tibialis pulses. Psychiatric pleasant and cooperative. Notes Left lower extremity: On the medial aspect there is an open wound granulation tissue nonviable tissue present. No signs of infection. Electronic Signature(s) Signed: 03/02/2021 10:46:31 AM By: [name] DO Entered By: [name] on 03/02/2021 10:44:44 -------------------------------------------------------------------------------- Physician Orders Details Patient Name: Date of Service: Megan Session D. 03/02/2021 9:15 A M Medical Record Number: [id number] Patient Account Number: [id number] Date of Birth/Sex: Treating RN: Oct 30, 1970 (50 y.o. [name] Primary Care Provider: [name] Other Clinician: Referring Provider: Treating Provider/Extender: Sammuel Bailiff in Treatment: 10 Verbal / Phone Orders: No Diagnosis Coding ICD-10 Coding Code Description [phone] Non-pressure chronic ulcer of other part of left lower leg with unspecified severity I87.2 Venous insufficiency (chronic) (peripheral) I89.0 Lymphedema, not  elsewhere classified Z86.718 Personal history of other venous thrombosis and embolism Follow-up Appointments ppointment in 1 week. - Friday Dr. Heber [place] Return A Bathing/ Shower/ Hygiene May shower with protection but do not get wound dressing(s) wet. - use cast protector to keep wrap dry in the shower Edema Control - Lymphedema / SCD / Other Bilateral Lower Extremities Lymphedema Pumps. Use Lymphedema pumps on leg(s) 2-3 times a day for 45-60 minutes. If wearing any wraps or hose, do not remove them. Continue exercising as instructed. Elevate legs to the level of the heart or  above for 30 minutes daily and/or when sitting, a frequency of: Avoid standing for long periods of time. Patient to wear own compression stockings every day. - right leg daily Exercise regularly Off-Loading Turn and reposition every 2 hours - Do not apply pressure to these areas. Wound Treatment Wound #4 - Lower Leg Wound Laterality: Left, Medial Cleanser: Soap and Water 1 x Per Week/15 Days Discharge Instructions: May shower and wash wound with dial antibacterial soap and water prior to dressing change. Peri-Wound Care: Sween Lotion (Moisturizing lotion) 1 x Per Week/15 Days Discharge Instructions: Apply moisturizing lotion as directed Prim Dressing: Promogran Prisma Matrix, 4.34 (sq in) (silver collagen) 1 x Per Week/15 Days ary Discharge Instructions: Moisten collagen with saline or hydrogel Secondary Dressing: Woven Gauze Sponge, Non-Sterile 4x4 in 1 x Per Week/15 Days Discharge Instructions: Apply over primary dressing as directed. Compression Wrap: ThreePress (3 layer compression wrap) 1 x Per Week/15 Days Discharge Instructions: Apply three layer compression as directed. Compression Wrap: Unnaboot w/Calamine, 4x10 (in/yd) 1 x Per Week/15 Days Discharge Instructions: Apply at top to anchor wrap Electronic Signature(s) Signed: 03/02/2021 10:46:31 AM By: [name] DO Entered By: [name] on  03/02/2021 10:44:58 -------------------------------------------------------------------------------- Problem List Details Patient Name: Date of Service: [name], [name] 03/02/2021 9:15 A M Medical Record Number: [id number] Patient Account Number: [id number] Date of Birth/Sex: Treating RN: 07/18/1970 (50 y.o. [name] Primary Care Provider: [name] Other Clinician: Referring Provider: Treating Provider/Extender: Sammuel Bailiff in Treatment: 19 Active Problems ICD-10 Encounter Code Description Active Date MDM Diagnosis L97.829 Non-pressure chronic ulcer of other part of left lower leg with unspecified 10/20/2020 No Yes severity I87.2 Venous insufficiency (chronic) (peripheral) 10/20/2020 No Yes I89.0 Lymphedema, not elsewhere classified 10/20/2020 No Yes Z86.718 Personal history of other venous thrombosis and embolism 10/20/2020 No Yes Inactive Problems Resolved Problems Electronic Signature(s) Signed: 03/02/2021 10:46:31 AM By: [name] DO Entered By: [name] on 03/02/2021 10:43:30 -------------------------------------------------------------------------------- Progress Note Details Patient Name: Date of Service: [name], [name] 03/02/2021 9:15 A M Medical Record Number: [id number] Patient Account Number: [id number] Date of Birth/Sex: Treating RN: [birth year] (50 y.o. [name] Primary Care Provider: [name] Other Clinician: Referring Provider: Treating Provider/Extender: Sammuel Bailiff in Treatment: 19 Subjective Chief Complaint Information obtained from Patient Left lower extremity wound History of Present Illness (HPI) Admission 6/24 [name] with a past medical history of chronic venous insufficiency, lymphedema, DVT on anticoagulation and diastolic heart failure that presents to the clinic for left lower extremity wound. She was  last seen 4 months ago in our clinic for the same issue. The reoccurring wound started at the end of May and has been going on for 1 month. She has been using an ointment and I am unclear what this is. She tries to keep her leg elevated with her compression stocking. She also reports she has lymphedema pumps and has been using them as well. She reports mild pain to the area. She denies signs of infection including increased warmth, erythema or purulent drainage. 7/1; patient presents for 1 week follow-up. She has tolerated the wrap well. Unfortunately she did have trouble with the wrap Sliding down her leg 2 days ago. She has no issues or complaints today. She denies signs of infection. 7/15; patient presents for follow-up. She has tolerated the wrap well. She has no issues or complaints today. She denies signs of infection. 7/25; patient presents for  1 week follow-up. She has no issues or complaints today. She denies signs of infection. 8/4; patient presents for follow-up. She has tolerated the compression wrap well to her left lower extremity. She states she was in the hospital last week due to fluid overload. She subsequently developed blisters to her bottom from being in the hospital bed for prolonged periods of time. These have since ruptured. She now has 2 areas of skin breakdown. She has been keeping gauze on them. She denies signs of infection. 8/18; patient presents for 2-week follow-up. She reports improvement to her buttocks wounds. She is able to tolerate the wrap well to her left lower extremity with no issues. She denies signs of infection. 9/1; patient presents for 2-week follow-up. She has no issues or complaints today. she denies signs of infection. 9/15; patient presents for 2-week follow-up. She has no issues or complaints today. She has tolerated the wrap well. She denies signs of infection. 9/27; patient presents for 1 week follow-up. She has no issues or complaints today. 10/20;  patient presents for follow-up. She was on vacation for the past 3 weeks. She states she had the last clinic visit wrap in place for 1 week and then she took this off. She states that she has been using her compression wraps since. She states that the wound has gotten larger. She reports pain to the wound site. She denies fever/chills, nausea/vomiting. She denies purulent drainage. 10/24; patient presents for follow-up. She states she tolerated the Kerlix/Coban wrap well. She reports decrease in pain. She states she started Keflex. She denies systemic signs of infection. 10/28; patient presents for follow-up. She has no issues or complaints today. She denies acute pain. She tolerated the wrap well. 11/4; patient presents for follow-up. She has no issues or complaints. She denies signs of infection. Patient History Information obtained from Patient. Family History Diabetes - Mother,Maternal Grandparents, Heart Disease - Paternal Grandparents, Hypertension - Mother,Maternal Grandparents, Lung Disease - Father,Paternal Grandparents, No family history of Cancer, Hereditary Spherocytosis, Kidney Disease, Seizures, Stroke, Thyroid Problems, Tuberculosis. Social History Former smoker, Marital Status - Single, Alcohol Use - Never, Drug Use - No History, Caffeine Use - Daily. Medical History Eyes Denies history of Cataracts, Glaucoma, Optic Neuritis Ear/Nose/Mouth/Throat Denies history of Chronic sinus problems/congestion, Middle ear problems Hematologic/Lymphatic Denies history of Anemia, Hemophilia, Human Immunodeficiency Virus, Lymphedema, Sickle Cell Disease Respiratory Patient has history of Sleep Apnea Denies history of Aspiration, Asthma, Chronic Obstructive Pulmonary Disease (COPD), Pneumothorax, Tuberculosis Cardiovascular Patient has history of Congestive Heart Failure, Coronary Artery Disease, Hypertension Denies history of Angina, Arrhythmia, Deep Vein Thrombosis, Hypotension,  Myocardial Infarction, Peripheral Arterial Disease, Peripheral Venous Disease, Phlebitis, Vasculitis Gastrointestinal Denies history of Cirrhosis , Colitis, Crohnoos, Hepatitis A, Hepatitis B, Hepatitis C Endocrine Denies history of Type I Diabetes, Type II Diabetes Genitourinary Denies history of End Stage Renal Disease Immunological Denies history of Lupus Erythematosus, Raynaudoos, Scleroderma Integumentary (Skin) Denies history of History of Burn Musculoskeletal Denies history of Gout, Rheumatoid Arthritis, Osteoarthritis, Osteomyelitis Neurologic Denies history of Dementia, Neuropathy, Quadriplegia, Paraplegia, Seizure Disorder Oncologic Denies history of Received Chemotherapy, Received Radiation Psychiatric Denies history of Anorexia/bulimia, Confinement Anxiety Objective Constitutional respirations regular, non-labored and within target range for patient.. Vitals Time Taken: 9:38 AM, Height: 67 in, Weight: 346 lbs, BMI: 54.2, Temperature: 98.1 F, Pulse: 82 bpm, Respiratory Rate: 20 breaths/min, Blood Pressure: 120/81 mmHg, Capillary Blood Glucose: 190 mg/dl. Cardiovascular 2+ dorsalis pedis/posterior tibialis pulses. Psychiatric pleasant and cooperative. General Notes: Left lower extremity: On the medial  aspect there is an open wound granulation tissue nonviable tissue present. No signs of infection. Integumentary (Hair, Skin) Wound #4 status is Open. Original cause of wound was Gradually Appeared. The date acquired was: 09/26/2020. The wound has been in treatment 19 weeks. The wound is located on the Left,Medial Lower Leg. The wound measures 1.9cm length x 1.5cm width x 0.1cm depth; 2.238cm^2 area and 0.224cm^3 volume. There is Fat Layer (Subcutaneous Tissue) exposed. There is no tunneling or undermining noted. There is a medium amount of serosanguineous drainage noted. The wound margin is distinct with the outline attached to the wound base. There is medium (34-66%) red,  pink granulation within the wound bed. There is a medium (34-66%) amount of necrotic tissue within the wound bed including Adherent Slough. Assessment Active Problems ICD-10 Non-pressure chronic ulcer of other part of left lower leg with unspecified severity Venous insufficiency (chronic) (peripheral) Lymphedema, not elsewhere classified Personal history of other venous thrombosis and embolism Patient's wound has shown improvement in size in appearance since last clinic visit. I debrided nonviable tissue. No signs of infection. The wound bed appeared dry and so I think she will do better with collagen at this time. We will continue this under 3 layer compression. Follow-up in 1 week. Procedures Wound #4 Pre-procedure diagnosis of Wound #4 is a Venous Leg Ulcer located on the Left,Medial Lower Leg .Severity of Tissue Pre Debridement is: Fat layer exposed. There was a Excisional Skin/Subcutaneous Tissue Debridement with a total area of 2.85 sq cm performed by [name], DO. With the following instrument(s): Curette to remove Viable and Non-Viable tissue/material. Material removed includes Subcutaneous Tissue and Slough and. No specimens were taken. A time out was conducted at 10:18, prior to the start of the procedure. A Minimum amount of bleeding was controlled with Pressure. The procedure was tolerated well with a pain level of 0 throughout and a pain level of 0 following the procedure. Post Debridement Measurements: 1.9cm length x 1.5cm width x 0.1cm depth; 0.224cm^3 volume. Character of Wound/Ulcer Post Debridement is improved. Severity of Tissue Post Debridement is: Fat layer exposed. Post procedure Diagnosis Wound #4: Same as Pre-Procedure Pre-procedure diagnosis of Wound #4 is a Venous Leg Ulcer located on the Left,Medial Lower Leg . There was a Three Layer Compression Therapy Procedure by Levan Hurst, RN. Post procedure Diagnosis Wound #4: Same as Pre-Procedure Plan Follow-up  Appointments: Return Appointment in 1 week. - Friday Dr. Heber [place] Bathing/ Shower/ Hygiene: May shower with protection but do not get wound dressing(s) wet. - use cast protector to keep wrap dry in the shower Edema Control - Lymphedema / SCD / Other: Lymphedema Pumps. Use Lymphedema pumps on leg(s) 2-3 times a day for 45-60 minutes. If wearing any wraps or hose, do not remove them. Continue exercising as instructed. Elevate legs to the level of the heart or above for 30 minutes daily and/or when sitting, a frequency of: Avoid standing for long periods of time. Patient to wear own compression stockings every day. - right leg daily Exercise regularly Off-Loading: Turn and reposition every 2 hours - Do not apply pressure to these areas. WOUND #4: - Lower Leg Wound Laterality: Left, Medial Cleanser: Soap and Water 1 x Per Week/15 Days Discharge Instructions: May shower and wash wound with dial antibacterial soap and water prior to dressing change. Peri-Wound Care: Sween Lotion (Moisturizing lotion) 1 x Per Week/15 Days Discharge Instructions: Apply moisturizing lotion as directed Prim Dressing: Promogran Prisma Matrix, 4.34 (sq in) (silver collagen) 1 x  Per Week/15 Days ary Discharge Instructions: Moisten collagen with saline or hydrogel Secondary Dressing: Woven Gauze Sponge, Non-Sterile 4x4 in 1 x Per Week/15 Days Discharge Instructions: Apply over primary dressing as directed. Com pression Wrap: ThreePress (3 layer compression wrap) 1 x Per Week/15 Days Discharge Instructions: Apply three layer compression as directed. Com pression Wrap: Unnaboot w/Calamine, 4x10 (in/yd) 1 x Per Week/15 Days Discharge Instructions: Apply at top to anchor wrap 1. Collagen under 3 layer compression 2. In office sharp debridement 3. Follow-up in 1 week Electronic Signature(s) Signed: 03/02/2021 10:46:31 AM By: [name] DO Entered By: [name] on 03/02/2021  10:45:58 -------------------------------------------------------------------------------- HxROS Details Patient Name: Date of Service: [name], [name] 03/02/2021 9:15 A M Medical Record Number: [id number] Patient Account Number: [id number] Date of Birth/Sex: Treating RN: [birth year] (50 y.o. [name] Primary Care Provider: [name] Other Clinician: Referring Provider: Treating Provider/Extender: Sammuel Bailiff in Treatment: 19 Information Obtained From Patient Eyes Medical History: Negative for: Cataracts; Glaucoma; Optic Neuritis Ear/Nose/Mouth/Throat Medical History: Negative for: Chronic sinus problems/congestion; Middle ear problems Hematologic/Lymphatic Medical History: Negative for: Anemia; Hemophilia; Human Immunodeficiency Virus; Lymphedema; Sickle Cell Disease Respiratory Medical History: Positive for: Sleep Apnea Negative for: Aspiration; Asthma; Chronic Obstructive Pulmonary Disease (COPD); Pneumothorax; Tuberculosis Cardiovascular Medical History: Positive for: Congestive Heart Failure; Coronary Artery Disease; Hypertension Negative for: Angina; Arrhythmia; Deep Vein Thrombosis; Hypotension; Myocardial Infarction; Peripheral Arterial Disease; Peripheral Venous Disease; Phlebitis; Vasculitis Gastrointestinal Medical History: Negative for: Cirrhosis ; Colitis; Crohns; Hepatitis A; Hepatitis B; Hepatitis C Endocrine Medical History: Negative for: Type I Diabetes; Type II Diabetes Genitourinary Medical History: Negative for: End Stage Renal Disease Immunological Medical History: Negative for: Lupus Erythematosus; Raynauds; Scleroderma Integumentary (Skin) Medical History: Negative for: History of Burn Musculoskeletal Medical History: Negative for: Gout; Rheumatoid Arthritis; Osteoarthritis; Osteomyelitis Neurologic Medical History: Negative for: Dementia; Neuropathy; Quadriplegia; Paraplegia; Seizure  Disorder Oncologic Medical History: Negative for: Received Chemotherapy; Received Radiation Psychiatric Medical History: Negative for: Anorexia/bulimia; Confinement Anxiety Immunizations Pneumococcal Vaccine: Received Pneumococcal Vaccination: No Implantable Devices None Family and Social History Cancer: No; Diabetes: Yes - Mother,Maternal Grandparents; Heart Disease: Yes - Paternal Grandparents; Hereditary Spherocytosis: No; Hypertension: Yes - Mother,Maternal Grandparents; Kidney Disease: No; Lung Disease: Yes - Father,Paternal Grandparents; Seizures: No; Stroke: No; Thyroid Problems: No; Tuberculosis: No; Former smoker; Marital Status - Single; Alcohol Use: Never; Drug Use: No History; Caffeine Use: Daily; Financial Concerns: No; Food, Clothing or Shelter Needs: No; Support System Lacking: No; Transportation Concerns: No Electronic Signature(s) Signed: 03/02/2021 10:46:31 AM By: [name] DO Signed: 03/02/2021 2:36:44 PM By: Deon Pilling RN, BSN Signed: 03/02/2021 2:36:44 PM By: Deon Pilling RN, BSN Entered By: [name] on 03/02/2021 10:44:18 -------------------------------------------------------------------------------- SuperBill Details Patient Name: Date of Service: [name], [name] 03/02/2021 Medical Record Number: [id number] Patient Account Number: [id number] Date of Birth/Sex: Treating RN: Jul 05, 1970 (50 y.o. [name], [name] Primary Care Provider: [name] Other Clinician: Referring Provider: Treating Provider/Extender: Sammuel Bailiff in Treatment: 19 Diagnosis Coding ICD-10 Codes Code Description [phone] Non-pressure chronic ulcer of other part of left lower leg with unspecified severity I87.2 Venous insufficiency (chronic) (peripheral) I89.0 Lymphedema, not elsewhere classified Z86.718 Personal history of other venous thrombosis and embolism Facility Procedures CPT4 Code: [id number] Description: 95093 - DEB SUBQ  TISSUE 20 SQ CM/< ICD-10 Diagnosis Description L97.829 Non-pressure chronic ulcer of other part of left lower leg with unspecified sev I87.2 Venous insufficiency (chronic) (peripheral) I89.0 Lymphedema, not elsewhere  classified Modifier: erity Quantity: 1 Physician  Procedures : CPT4 Code Description Modifier [id number] 11042 - WC PHYS SUBQ TISS 20 SQ CM ICD-10 Diagnosis Description L97.829 Non-pressure chronic ulcer of other part of left lower leg with unspecified severity I87.2 Venous insufficiency (chronic) (peripheral) I89.0  Lymphedema, not elsewhere classified Quantity: 1 Electronic Signature(s) Signed: 03/02/2021 10:46:31 AM By: [name] DO Entered By: [name] on 03/02/2021 10:46:09

## 2021-03-02 NOTE — Telephone Encounter (Signed)
Contacted pt per Dr Irene Limbo:  to let patient know her ferritin levels have dropped to 24 with an iron saturation is low at 12%.  We recommend 1 dose of IV Injectafer to maintain her iron stores and avoid worsening anemia.  Patient agreeable will get scheduling to contact her for an appointment.

## 2021-03-04 ENCOUNTER — Other Ambulatory Visit: Payer: Self-pay | Admitting: Internal Medicine

## 2021-03-04 DIAGNOSIS — F411 Generalized anxiety disorder: Secondary | ICD-10-CM

## 2021-03-04 NOTE — Telephone Encounter (Signed)
Requested Prescriptions  Pending Prescriptions Disp Refills  . PARoxetine (PAXIL) 10 MG tablet [Pharmacy Med Name: PARoxetine HCL 10 MG TABLET] 30 tablet 0    Sig: TAKE ONE TABLET BY MOUTH DAILY     Psychiatry:  Antidepressants - SSRI Passed - 03/04/2021 12:22 PM      Passed - Valid encounter within last 6 months    Recent Outpatient Visits          3 days ago Iron deficiency anemia due to chronic blood loss   [place] Ladell Pier, MD   2 months ago Preoperative evaluation to rule out surgical contraindication   [place] [name] B, MD   3 months ago Type 2 diabetes mellitus with hyperglycemia, unspecified whether long term insulin use [facility])   [place], [place], [place]   8 months ago Chronic midline thoracic back pain   [place], MD   11 months ago Establishing care with new doctor, encounter for   [place], MD      Future Appointments            In 4 months Ellyn Hack Leonie Green, MD [place] [facility], [facility]

## 2021-03-05 ENCOUNTER — Telehealth: Payer: Self-pay | Admitting: Hematology

## 2021-03-05 NOTE — Telephone Encounter (Signed)
In-lab split night study ordered

## 2021-03-05 NOTE — Progress Notes (Signed)
REDELL, BHANDARI ([id number]) Visit Report for 03/02/2021 Arrival Information Details Patient Name: Date of Service: [name], [name] 03/02/2021 9:15 A M Medical Record Number: [id number] Patient Account Number: [id number] Date of Birth/Sex: Treating RN: 06/23/1970 (50 y.o. [name], [name] Primary Care Shashana Fullington: [name] Other Clinician: Referring Alferd Obryant: Treating Joson Sapp/Extender: Sammuel Bailiff in Treatment: 71 Visit Information History Since Last Visit Added or deleted any medications: No Patient Arrived: Ambulatory Any new allergies or adverse reactions: No Arrival Time: 09:37 Had a fall or experienced change in No Accompanied By: self activities of daily living that may affect Transfer Assistance: None risk of falls: Patient Identification Verified: Yes Signs or symptoms of abuse/neglect since last visito No Secondary Verification Process Completed: Yes Hospitalized since last visit: No Patient Requires Transmission-Based Precautions: No Implantable device outside of the clinic excluding No Patient Has Alerts: Yes cellular tissue based products placed in the center Patient Alerts: ABI's: 11/21 L:1.17 since last visit: Has Dressing in Place as Prescribed: Yes Pain Present Now: No Electronic Signature(s) Signed: 03/05/2021 3:53:30 PM By: Sandre Kitty Entered By: Sandre Kitty on 03/02/2021 09:37:48 -------------------------------------------------------------------------------- Compression Therapy Details Patient Name: Date of Service: Megan Session D. 03/02/2021 9:15 A M Medical Record Number: [id number] Patient Account Number: [id number] Date of Birth/Sex: Treating RN: 08-Nov-1970 (50 y.o. [name] Primary Care Ryleah Miramontes: [name] Other Clinician: Referring Hattie Aguinaldo: Treating Sapir Lavey/Extender: Sammuel Bailiff in Treatment: 19 Compression Therapy Performed for Wound Assessment:  Wound #4 Left,Medial Lower Leg Performed By: Clinician Levan Hurst, RN Compression Type: Three Layer Post Procedure Diagnosis Same as Pre-procedure Electronic Signature(s) Signed: 03/02/2021 2:36:22 PM By: Levan Hurst RN, BSN Entered By: Levan Hurst on 03/02/2021 10:20:23 -------------------------------------------------------------------------------- Encounter Discharge Information Details Patient Name: Date of Service: Megan Session D. 03/02/2021 9:15 A M Medical Record Number: [id number] Patient Account Number: [id number] Date of Birth/Sex: Treating RN: December 13, 1970 (50 y.o. [name] Primary Care Louay Myrie: [name] Other Clinician: Referring Ayvin Lipinski: Treating Finnis Colee/Extender: Sammuel Bailiff in Treatment: 19 Encounter Discharge Information Items Post Procedure Vitals Discharge Condition: Stable Temperature (F): 98.1 Ambulatory Status: Ambulatory Pulse (bpm): 82 Discharge Destination: Home Respiratory Rate (breaths/min): 20 Transportation: Private Auto Blood Pressure (mmHg): 120/81 Accompanied By: alone Schedule Follow-up Appointment: Yes Clinical Summary of Care: Patient Declined Electronic Signature(s) Signed: 03/02/2021 2:36:22 PM By: Levan Hurst RN, BSN Entered By: Levan Hurst on 03/02/2021 11:15:05 -------------------------------------------------------------------------------- Lower Extremity Assessment Details Patient Name: Date of Service: Megan Session D. 03/02/2021 9:15 A M Medical Record Number: [id number] Patient Account Number: [id number] Date of Birth/Sex: Treating RN: [birth year] (50 y.o. [name] Primary Care Corrin Hingle: [name] Other Clinician: Referring Jamicheal Heard: Treating Estrella Alcaraz/Extender: Sammuel Bailiff in Treatment: 19 Edema Assessment Assessed: [name]: No] [name]: No] Edema: [Left: Ye] [Right: s] Calf Left: Right: Point of Measurement: 25 cm  From Medial Instep 49 cm Ankle Left: Right: Point of Measurement: 9 cm From Medial Instep 23 cm Vascular Assessment Pulses: Dorsalis Pedis Palpable: [Left:Yes] Electronic Signature(s) Signed: 03/02/2021 2:36:22 PM By: Levan Hurst RN, BSN Entered By: Levan Hurst on 03/02/2021 10:05:31 -------------------------------------------------------------------------------- Multi Wound Chart Details Patient Name: Date of Service: [name], [name] 03/02/2021 9:15 A M Medical Record Number: [id number] Patient Account Number: [id number] Date of Birth/Sex: Treating RN: Jun 20, 1970 (51 y.o. [name] Primary Care Siriyah Ambrosius: [name] Other Clinician: Referring Giankarlo Leamer: Treating Sayed Apostol/Extender: Sammuel Bailiff in Treatment: 19 Vital Signs Height(in): 21  Capillary Blood Glucose(mg/dl): 190 Weight(lbs): 346 Pulse(bpm): 82 Body Mass Index(BMI): 54 Blood Pressure(mmHg): 120/81 Temperature(F): 98.1 Respiratory Rate(breaths/min): 20 Photos: [N/A:N/A] Left, Medial Lower Leg N/A N/A Wound Location: Gradually Appeared N/A N/A Wounding Event: Venous Leg Ulcer N/A N/A Primary Etiology: Sleep Apnea, Congestive Heart N/A N/A Comorbid History: Failure, Coronary Artery Disease, Hypertension 09/26/2020 N/A N/A Date Acquired: 69 N/A N/A Weeks of Treatment: Open N/A N/A Wound Status: 1.9x1.5x0.1 N/A N/A Measurements L x W x D (cm) 2.238 N/A N/A A (cm) : rea 0.224 N/A N/A Volume (cm) : 86.20% N/A N/A % Reduction in A rea: 86.20% N/A N/A % Reduction in Volume: Full Thickness Without Exposed N/A N/A Classification: Support Structures Medium N/A N/A Exudate A mount: Serosanguineous N/A N/A Exudate Type: red, brown N/A N/A Exudate Color: Distinct, outline attached N/A N/A Wound Margin: Medium (34-66%) N/A N/A Granulation A mount: Red, Pink N/A N/A Granulation Quality: Medium (34-66%) N/A N/A Necrotic A mount: Fat Layer  (Subcutaneous Tissue): Yes N/A N/A Exposed Structures: Fascia: No Tendon: No Muscle: No Joint: No Bone: No Small (1-33%) N/A N/A Epithelialization: Debridement - Excisional N/A N/A Debridement: Pre-procedure Verification/Time Out 10:18 N/A N/A Taken: Subcutaneous, Slough N/A N/A Tissue Debrided: Skin/Subcutaneous Tissue N/A N/A Level: 2.85 N/A N/A Debridement A (sq cm): rea Curette N/A N/A Instrument: Minimum N/A N/A Bleeding: Pressure N/A N/A Hemostasis A chieved: 0 N/A N/A Procedural Pain: 0 N/A N/A Post Procedural Pain: Procedure was tolerated well N/A N/A Debridement Treatment Response: 1.9x1.5x0.1 N/A N/A Post Debridement Measurements L x W x D (cm) 0.224 N/A N/A Post Debridement Volume: (cm) Compression Therapy N/A N/A Procedures Performed: Debridement Treatment Notes Electronic Signature(s) Signed: 03/02/2021 10:46:31 AM By: [name] DO Signed: 03/02/2021 2:36:44 PM By: Deon Pilling RN, BSN Entered By: [name] on 03/02/2021 10:43:34 -------------------------------------------------------------------------------- Multi-Disciplinary Care Plan Details Patient Name: Date of Service: [name], [name] 03/02/2021 9:15 A M Medical Record Number: [id number] Patient Account Number: [id number] Date of Birth/Sex: Treating RN: [birth year] (50 y.o. [name] Primary Care Liesa Tsan: [name] Other Clinician: Referring Kharter Sestak: Treating Gasper Hopes/Extender: Sammuel Bailiff in Treatment: 19 Multidisciplinary Care Plan reviewed with physician Active Inactive Venous Leg Ulcer Nursing Diagnoses: Actual venous Insuffiency (use after diagnosis is confirmed) Knowledge deficit related to disease process and management Potential for venous Insuffiency (use before diagnosis confirmed) Goals: Patient will maintain optimal edema control Date Initiated: 10/20/2020 Target Resolution Date: 03/23/2021 Goal Status:  Active Interventions: Assess peripheral edema status every visit. Compression as ordered Treatment Activities: Therapeutic compression applied : 10/20/2020 Notes: 12/28/20: Edema control ongoing. Wound/Skin Impairment Nursing Diagnoses: Impaired tissue integrity Knowledge deficit related to ulceration/compromised skin integrity Goals: Patient/caregiver will verbalize understanding of skin care regimen Date Initiated: 10/20/2020 Target Resolution Date: 03/23/2021 Goal Status: Active Ulcer/skin breakdown will have a volume reduction of 30% by week 4 Date Initiated: 10/20/2020 Date Inactivated: 12/14/2020 Target Resolution Date: 11/24/2020 Goal Status: Met Ulcer/skin breakdown will have a volume reduction of 80% by week 12 Date Initiated: 12/14/2020 Date Inactivated: 01/23/2021 Target Resolution Date: 01/18/2021 Goal Status: Met Ulcer/skin breakdown will heal within 14 weeks Date Initiated: 01/23/2021 Date Inactivated: 02/23/2021 Target Resolution Date: 02/23/2021 Goal Status: Unmet Unmet Reason: infection Interventions: Assess patient/caregiver ability to obtain necessary supplies Assess patient/caregiver ability to perform ulcer/skin care regimen upon admission and as needed Assess ulceration(s) every visit Provide education on ulcer and skin care Treatment Activities: Skin care regimen initiated : 10/20/2020 Topical wound management initiated : 10/20/2020 Notes: Electronic Signature(s) Signed: 03/02/2021 2:36:22  PM By: Levan Hurst RN, BSN Entered By: Levan Hurst on 03/02/2021 11:13:41 -------------------------------------------------------------------------------- Pain Assessment Details Patient Name: Date of Service: Megan Session D. 03/02/2021 9:15 A M Medical Record Number: [id number] Patient Account Number: [id number] Date of Birth/Sex: Treating RN: [birth year] (50 y.o. [name] Primary Care Hillard Goodwine: [name] Other Clinician: Referring  Belicia Difatta: Treating Jonnell Hentges/Extender: Sammuel Bailiff in Treatment: 19 Active Problems Location of Pain Severity and Description of Pain Patient Has Paino No Site Locations Pain Management and Medication Current Pain Management: Electronic Signature(s) Signed: 03/02/2021 2:36:44 PM By: Deon Pilling RN, BSN Signed: 03/05/2021 3:53:30 PM By: Sandre Kitty Entered By: Sandre Kitty on 03/02/2021 09:41:22 -------------------------------------------------------------------------------- Patient/Caregiver Education Details Patient Name: Date of Service: Megan Session D. 11/4/2022andnbsp9:15 A M Medical Record Number: [id number] Patient Account Number: [id number] Date of Birth/Gender: Treating RN: 07-01-1970 (50 y.o. [name] Primary Care Physician: [name] Other Clinician: Referring Physician: Treating Physician/Extender: Sammuel Bailiff in Treatment: 19 Education Assessment Education Provided To: Patient Education Topics Provided Wound/Skin Impairment: Methods: Explain/Verbal Responses: State content correctly [employer]) Signed: 03/02/2021 2:36:22 PM By: Levan Hurst RN, BSN Entered By: Levan Hurst on 03/02/2021 11:13:52 -------------------------------------------------------------------------------- Wound Assessment Details Patient Name: Date of Service: Megan Session D. 03/02/2021 9:15 A M Medical Record Number: [id number] Patient Account Number: [id number] Date of Birth/Sex: Treating RN: Jun 30, 1970 (50 y.o. [name], [name] Primary Care Murry Khiev: [name] Other Clinician: Referring Mariame Rybolt: Treating Khari Mally/Extender: Sammuel Bailiff in Treatment: 19 Wound Status Wound Number: 4 Primary Venous Leg Ulcer Etiology: Wound Location: Left, Medial Lower Leg Wound Open Wounding Event: Gradually Appeared Status: Date Acquired:  09/26/2020 Comorbid Sleep Apnea, Congestive Heart Failure, Coronary Artery Weeks Of Treatment: 19 History: Disease, Hypertension Clustered Wound: No Photos Wound Measurements Length: (cm) 1.9 Width: (cm) 1.5 Depth: (cm) 0.1 Area: (cm) 2.238 Volume: (cm) 0.224 % Reduction in Area: 86.2% % Reduction in Volume: 86.2% Epithelialization: Small (1-33%) Tunneling: No Undermining: No Wound Description Classification: Full Thickness Without Exposed Support Structures Wound Margin: Distinct, outline attached Exudate Amount: Medium Exudate Type: Serosanguineous Exudate Color: red, brown Foul Odor After Cleansing: No Slough/Fibrino Yes Wound Bed Granulation Amount: Medium (34-66%) Exposed Structure Granulation Quality: Red, Pink Fascia Exposed: No Necrotic Amount: Medium (34-66%) Fat Layer (Subcutaneous Tissue) Exposed: Yes Necrotic Quality: Adherent Slough Tendon Exposed: No Muscle Exposed: No Joint Exposed: No Bone Exposed: No Treatment Notes Wound #4 (Lower Leg) Wound Laterality: Left, Medial Cleanser Soap and Water Discharge Instruction: May shower and wash wound with dial antibacterial soap and water prior to dressing change. Peri-Wound Care Sween Lotion (Moisturizing lotion) Discharge Instruction: Apply moisturizing lotion as directed Topical Primary Dressing Promogran Prisma Matrix, 4.34 (sq in) (silver collagen) Discharge Instruction: Moisten collagen with saline or hydrogel Secondary Dressing Woven Gauze Sponge, Non-Sterile 4x4 in Discharge Instruction: Apply over primary dressing as directed. Secured With Compression Wrap ThreePress (3 layer compression wrap) Discharge Instruction: Apply three layer compression as directed. Unnaboot w/Calamine, 4x10 (in/yd) Discharge Instruction: Apply at top to anchor wrap Compression Stockings Add-Ons Electronic Signature(s) Signed: 03/02/2021 2:36:22 PM By: Levan Hurst RN, BSN Signed: 03/02/2021 2:36:44 PM By: Deon Pilling RN, BSN Entered By: Levan Hurst on 03/02/2021 10:06:12 -------------------------------------------------------------------------------- Vitals Details Patient Name: Date of Service: [name], [name] 03/02/2021 9:15 A M Medical Record Number: [id number] Patient Account Number: [id number] Date of Birth/Sex: Treating RN: Jul 23, 1970 (50 y.o. [name] Primary Care Lollie Gunner: [name] Other Clinician: Referring Tiwatope Emmitt:  Treating Jackolyn Geron/Extender: Sammuel Bailiff in Treatment: 19 Vital Signs Time Taken: 09:38 Temperature (F): 98.1 Height (in): 67 Pulse (bpm): 82 Weight (lbs): 346 Respiratory Rate (breaths/min): 20 Body Mass Index (BMI): 54.2 Blood Pressure (mmHg): 120/81 Capillary Blood Glucose (mg/dl): 190 Reference Range: 80 - 120 mg / dl Electronic Signature(s) Signed: 03/05/2021 3:53:30 PM By: Sandre Kitty Entered By: Sandre Kitty on 03/02/2021 09:41:15

## 2021-03-05 NOTE — Telephone Encounter (Signed)
Sch per 11/4 inbasket. Ptaware

## 2021-03-06 NOTE — Telephone Encounter (Signed)
I called and spoke with patient and she voices understanding. Nothing further needed.

## 2021-03-07 ENCOUNTER — Ambulatory Visit (HOSPITAL_COMMUNITY)
Admission: RE | Admit: 2021-03-07 | Discharge: 2021-03-07 | Disposition: A | Discharge status: Home / Self Care | Payer: Medicaid Other | Source: Ambulatory Visit | Attending: Internal Medicine | Admitting: Internal Medicine

## 2021-03-07 ENCOUNTER — Other Ambulatory Visit: Payer: Self-pay

## 2021-03-07 DIAGNOSIS — N950 Postmenopausal bleeding: Secondary | ICD-10-CM | POA: Insufficient documentation

## 2021-03-07 DIAGNOSIS — Z90722 Acquired absence of ovaries, bilateral: Secondary | ICD-10-CM | POA: Diagnosis not present

## 2021-03-09 ENCOUNTER — Encounter (HOSPITAL_BASED_OUTPATIENT_CLINIC_OR_DEPARTMENT_OTHER): Payer: Medicaid Other | Admitting: Internal Medicine

## 2021-03-09 ENCOUNTER — Other Ambulatory Visit: Payer: Self-pay

## 2021-03-09 DIAGNOSIS — Z86718 Personal history of other venous thrombosis and embolism: Secondary | ICD-10-CM

## 2021-03-09 DIAGNOSIS — I890 Lymphedema, not elsewhere classified: Secondary | ICD-10-CM

## 2021-03-09 DIAGNOSIS — I87312 Chronic venous hypertension (idiopathic) with ulcer of left lower extremity: Secondary | ICD-10-CM

## 2021-03-09 DIAGNOSIS — L97829 Non-pressure chronic ulcer of other part of left lower leg with unspecified severity: Secondary | ICD-10-CM

## 2021-03-11 ENCOUNTER — Other Ambulatory Visit: Payer: Self-pay | Admitting: Physician Assistant

## 2021-03-11 DIAGNOSIS — Z8709 Personal history of other diseases of the respiratory system: Secondary | ICD-10-CM

## 2021-03-11 NOTE — Telephone Encounter (Signed)
Requested Prescriptions  Pending Prescriptions Disp Refills  . [place] 200-5 MCG/ACT AERO [Pharmacy Med Name: DULERA 200 MCG-5 MCG INHALER] 8.8 g 2    Sig: INHALE 2 PUFFS INTO THE LUNGS TWO TIMES A DAY     Pulmonology:  Combination Products Passed - 03/11/2021  6:51 AM      Passed - Valid encounter within last 12 months    Recent Outpatient Visits          1 week ago Iron deficiency anemia due to chronic blood loss   [place] Ladell Pier, MD   2 months ago Preoperative evaluation to rule out surgical contraindication   [place] [name] B, MD   3 months ago Type 2 diabetes mellitus with hyperglycemia, unspecified whether long term insulin use [facility])   [place], [place], [place]   8 months ago Chronic midline thoracic back pain   [place], MD   11 months ago Establishing care with new doctor, encounter for   [place], MD      Future Appointments            In 4 months Ellyn Hack Leonie Green, MD [place] [facility], [facility]

## 2021-03-12 ENCOUNTER — Other Ambulatory Visit: Payer: Self-pay

## 2021-03-12 ENCOUNTER — Inpatient Hospital Stay: Payer: Medicaid Other | Attending: Hematology

## 2021-03-12 VITALS — BP 142/64 | HR 86 | Temp 98.3°F | Resp 18

## 2021-03-12 DIAGNOSIS — D509 Iron deficiency anemia, unspecified: Secondary | ICD-10-CM | POA: Diagnosis not present

## 2021-03-12 DIAGNOSIS — D500 Iron deficiency anemia secondary to blood loss (chronic): Secondary | ICD-10-CM

## 2021-03-12 MED ORDER — HEPARIN SOD (PORK) LOCK FLUSH 100 UNIT/ML IV SOLN
500.0000 [IU] | Freq: Once | INTRAVENOUS | Status: AC
  Administered 2021-03-12: 500 [IU] via INTRAVENOUS

## 2021-03-12 MED ORDER — SODIUM CHLORIDE 0.9 % IV SOLN
750.0000 mg | Freq: Once | INTRAVENOUS | Status: AC
  Administered 2021-03-12: 750 mg via INTRAVENOUS
  Filled 2021-03-12: qty 15

## 2021-03-12 MED ORDER — ACETAMINOPHEN 325 MG PO TABS
650.0000 mg | ORAL_TABLET | Freq: Once | ORAL | Status: AC
  Administered 2021-03-12: 650 mg via ORAL
  Filled 2021-03-12: qty 2

## 2021-03-12 MED ORDER — SODIUM CHLORIDE 0.9% FLUSH
10.0000 mL | Freq: Once | INTRAVENOUS | Status: AC
  Administered 2021-03-12: 10 mL via INTRAVENOUS

## 2021-03-12 MED ORDER — SODIUM CHLORIDE 0.9 % IV SOLN: INTRAVENOUS | Status: DC

## 2021-03-12 MED ORDER — LORATADINE 10 MG PO TABS
10.0000 mg | ORAL_TABLET | Freq: Once | ORAL | Status: AC
  Administered 2021-03-12: 10 mg via ORAL
  Filled 2021-03-12: qty 1

## 2021-03-12 NOTE — Progress Notes (Signed)
LUVINIA, LUCY ([id number]) Visit Report for 03/09/2021 Chief Complaint Document Details Patient Name: Date of Service: MARBELLA, MARKGRAF D. 03/09/2021 8:45 A M Medical Record Number: [id number] Patient Account Number: [id number] Date of Birth/Sex: Treating RN: 1970/09/29 (50 y.o. [name] Primary Care Provider: [name] Other Clinician: Referring Provider: Treating Provider/Extender: Sammuel Bailiff in Treatment: 20 Information Obtained from: Patient Chief Complaint Left lower extremity wound Electronic Signature(s) Signed: 03/09/2021 9:57:49 AM By: [name] DO Entered By: [name] on 03/09/2021 09:53:56 -------------------------------------------------------------------------------- HPI Details Patient Name: Date of Service: [name], Versa D. 03/09/2021 8:45 A M Medical Record Number: [id number] Patient Account Number: [id number] Date of Birth/Sex: Treating RN: 07-28-1970 (50 y.o. [name] Primary Care Provider: [name] Other Clinician: Referring Provider: Treating Provider/Extender: Sammuel Bailiff in Treatment: 20 History of Present Illness HPI Description: Admission 6/24 Ms. Tiffini Blacksher is a 50 year old female with a past medical history of chronic venous insufficiency, lymphedema, DVT on anticoagulation and diastolic heart failure that presents to the clinic for left lower extremity wound. She was last seen 4 months ago in our clinic for the same issue. The reoccurring wound started at the end of May and has been going on for 1 month. She has been using an ointment and I am unclear what this is. She tries to keep her leg elevated with her compression stocking. She also reports she has lymphedema pumps and has been using them as well. She reports mild pain to the area. She denies signs of infection including increased warmth, erythema or purulent drainage. 7/1;  patient presents for 1 week follow-up. She has tolerated the wrap well. Unfortunately she did have trouble with the wrap Sliding down her leg 2 days ago. She has no issues or complaints today. She denies signs of infection. 7/15; patient presents for follow-up. She has tolerated the wrap well. She has no issues or complaints today. She denies signs of infection. 7/25; patient presents for 1 week follow-up. She has no issues or complaints today. She denies signs of infection. 8/4; patient presents for follow-up. She has tolerated the compression wrap well to her left lower extremity. She states she was in the hospital last week due to fluid overload. She subsequently developed blisters to her bottom from being in the hospital bed for prolonged periods of time. These have since ruptured. She now has 2 areas of skin breakdown. She has been keeping gauze on them. She denies signs of infection. 8/18; patient presents for 2-week follow-up. She reports improvement to her buttocks wounds. She is able to tolerate the wrap well to her left lower extremity with no issues. She denies signs of infection. 9/1; patient presents for 2-week follow-up. She has no issues or complaints today. she denies signs of infection. 9/15; patient presents for 2-week follow-up. She has no issues or complaints today. She has tolerated the wrap well. She denies signs of infection. 9/27; patient presents for 1 week follow-up. She has no issues or complaints today. 10/20; patient presents for follow-up. She was on vacation for the past 3 weeks. She states she had the last clinic visit wrap in place for 1 week and then she took this off. She states that she has been using her compression wraps since. She states that the wound has gotten larger. She reports pain to the wound site. She denies fever/chills, nausea/vomiting. She denies purulent drainage. 10/24; patient presents for follow-up. She states she tolerated the Kerlix/Coban  wrap  well. She reports decrease in pain. She states she started Keflex. She denies systemic signs of infection. 10/28; patient presents for follow-up. She has no issues or complaints today. She denies acute pain. She tolerated the wrap well. 11/4; patient presents for follow-up. She has no issues or complaints. She denies signs of infection. 11/11; patient presents for follow-up. She has no issues or complaints today. She denies signs of infection. She tolerated the wrap well. Electronic Signature(s) Signed: 03/09/2021 9:57:49 AM By: [name] DO Entered By: [name] on 03/09/2021 09:54:16 -------------------------------------------------------------------------------- Physical Exam Details Patient Name: Date of Service: [name] 03/09/2021 8:45 A M Medical Record Number: [id number] Patient Account Number: [id number] Date of Birth/Sex: Treating RN: Jan 16, 1971 (50 y.o. [name] Primary Care Provider: [name] Other Clinician: Referring Provider: Treating Provider/Extender: Sammuel Bailiff in Treatment: 20 Constitutional respirations regular, non-labored and within target range for patient.. Cardiovascular 2+ dorsalis pedis/posterior tibialis pulses. Psychiatric pleasant and cooperative. Notes Left lower extremity: On the medial aspect there is an open wound with granulation tissue present. No signs of infection. Good edema control. Electronic Signature(s) Signed: 03/09/2021 9:57:49 AM By: [name] DO Entered By: [name] on 03/09/2021 09:54:52 -------------------------------------------------------------------------------- Physician Orders Details Patient Name: Date of Service: [name] 03/09/2021 8:45 A M Medical Record Number: [id number] Patient Account Number: [id number] Date of Birth/Sex: Treating RN: Feb 05, 1971 (51 y.o. [name] Primary Care Provider: [name] Other  Clinician: Referring Provider: Treating Provider/Extender: Sammuel Bailiff in Treatment: 20 Verbal / Phone Orders: No Diagnosis Coding ICD-10 Coding Code Description [phone] Non-pressure chronic ulcer of other part of left lower leg with unspecified severity I87.312 Chronic venous hypertension (idiopathic) with ulcer of left lower extremity I89.0 Lymphedema, not elsewhere classified Z86.718 Personal history of other venous thrombosis and embolism Follow-up Appointments ppointment in 1 week. - Dr. Heber [place] Return A Nurse Visit: - in 2 weeks Cellular or Tissue Based Products Cellular or Tissue Based Product Type: - Run IVR for Grafix and EpiFix Bathing/ Shower/ Hygiene May shower with protection but do not get wound dressing(s) wet. - use cast protector to keep wrap dry in the shower Edema Control - Lymphedema / SCD / Other Bilateral Lower Extremities Lymphedema Pumps. Use Lymphedema pumps on leg(s) 2-3 times a day for 45-60 minutes. If wearing any wraps or hose, do not remove them. Continue exercising as instructed. Elevate legs to the level of the heart or above for 30 minutes daily and/or when sitting, a frequency of: Avoid standing for long periods of time. Patient to wear own compression stockings every day. - right leg daily Exercise regularly Off-Loading Turn and reposition every 2 hours - Do not apply pressure to these areas. Wound Treatment Wound #4 - Lower Leg Wound Laterality: Left, Medial Cleanser: Soap and Water 1 x Per Week/15 Days Discharge Instructions: May shower and wash wound with dial antibacterial soap and water prior to dressing change. Peri-Wound Care: Triamcinolone 15 (g) 1 x Per Week/15 Days Discharge Instructions: Use triamcinolone 15 (g) as directed Peri-Wound Care: Sween Lotion (Moisturizing lotion) 1 x Per Week/15 Days Discharge Instructions: Apply moisturizing lotion as directed Prim Dressing: Promogran Prisma Matrix, 4.34 (sq  in) (silver collagen) 1 x Per Week/15 Days ary Discharge Instructions: Moisten collagen with saline or hydrogel Secondary Dressing: Woven Gauze Sponge, Non-Sterile 4x4 in 1 x Per Week/15 Days Discharge Instructions: Apply over primary dressing as directed. Compression Wrap: ThreePress (3 layer compression wrap)  1 x Per Week/15 Days Discharge Instructions: Apply three layer compression as directed. Compression Wrap: Unnaboot w/Calamine, 4x10 (in/yd) 1 x Per Week/15 Days Discharge Instructions: Apply at top to anchor wrap Electronic Signature(s) Signed: 03/09/2021 9:57:49 AM By: [name] DO Entered By: [name] on 03/09/2021 09:55:14 -------------------------------------------------------------------------------- Problem List Details Patient Name: Date of Service: [name], Yeraldi D. 03/09/2021 8:45 A M Medical Record Number: [id number] Patient Account Number: [id number] Date of Birth/Sex: Treating RN: 05-26-1970 (50 y.o. [name] Primary Care Provider: [name] Other Clinician: Referring Provider: Treating Provider/Extender: Sammuel Bailiff in Treatment: 20 Active Problems ICD-10 Encounter Code Description Active Date MDM Diagnosis L97.829 Non-pressure chronic ulcer of other part of left lower leg with unspecified 10/20/2020 No Yes severity I87.312 Chronic venous hypertension (idiopathic) with ulcer of left lower extremity 03/09/2021 No Yes I89.0 Lymphedema, not elsewhere classified 10/20/2020 No Yes Z86.718 Personal history of other venous thrombosis and embolism 10/20/2020 No Yes Inactive Problems Resolved Problems Electronic Signature(s) Signed: 03/09/2021 9:57:49 AM By: [name] DO Entered By: [name] on 03/09/2021 09:53:32 -------------------------------------------------------------------------------- Progress Note Details Patient Name: Date of Service: [name], Bonnee D. 03/09/2021 8:45 A  M Medical Record Number: [id number] Patient Account Number: [id number] Date of Birth/Sex: Treating RN: [birth year] (50 y.o. [name] Primary Care Provider: [name] Other Clinician: Referring Provider: Treating Provider/Extender: Sammuel Bailiff in Treatment: 20 Subjective Chief Complaint Information obtained from Patient Left lower extremity wound History of Present Illness (HPI) Admission 6/24 Ms. Ceri Mayer is a 50 year old female with a past medical history of chronic venous insufficiency, lymphedema, DVT on anticoagulation and diastolic heart failure that presents to the clinic for left lower extremity wound. She was last seen 4 months ago in our clinic for the same issue. The reoccurring wound started at the end of May and has been going on for 1 month. She has been using an ointment and I am unclear what this is. She tries to keep her leg elevated with her compression stocking. She also reports she has lymphedema pumps and has been using them as well. She reports mild pain to the area. She denies signs of infection including increased warmth, erythema or purulent drainage. 7/1; patient presents for 1 week follow-up. She has tolerated the wrap well. Unfortunately she did have trouble with the wrap Sliding down her leg 2 days ago. She has no issues or complaints today. She denies signs of infection. 7/15; patient presents for follow-up. She has tolerated the wrap well. She has no issues or complaints today. She denies signs of infection. 7/25; patient presents for 1 week follow-up. She has no issues or complaints today. She denies signs of infection. 8/4; patient presents for follow-up. She has tolerated the compression wrap well to her left lower extremity. She states she was in the hospital last week due to fluid overload. She subsequently developed blisters to her bottom from being in the hospital bed for prolonged periods of time. These  have since ruptured. She now has 2 areas of skin breakdown. She has been keeping gauze on them. She denies signs of infection. 8/18; patient presents for 2-week follow-up. She reports improvement to her buttocks wounds. She is able to tolerate the wrap well to her left lower extremity with no issues. She denies signs of infection. 9/1; patient presents for 2-week follow-up. She has no issues or complaints today. she denies signs of infection. 9/15; patient presents for 2-week follow-up. She has no  issues or complaints today. She has tolerated the wrap well. She denies signs of infection. 9/27; patient presents for 1 week follow-up. She has no issues or complaints today. 10/20; patient presents for follow-up. She was on vacation for the past 3 weeks. She states she had the last clinic visit wrap in place for 1 week and then she took this off. She states that she has been using her compression wraps since. She states that the wound has gotten larger. She reports pain to the wound site. She denies fever/chills, nausea/vomiting. She denies purulent drainage. 10/24; patient presents for follow-up. She states she tolerated the Kerlix/Coban wrap well. She reports decrease in pain. She states she started Keflex. She denies systemic signs of infection. 10/28; patient presents for follow-up. She has no issues or complaints today. She denies acute pain. She tolerated the wrap well. 11/4; patient presents for follow-up. She has no issues or complaints. She denies signs of infection. 11/11; patient presents for follow-up. She has no issues or complaints today. She denies signs of infection. She tolerated the wrap well. Patient History Information obtained from Patient. Family History Diabetes - Mother,Maternal Grandparents, Heart Disease - Paternal Grandparents, Hypertension - Mother,Maternal Grandparents, Lung Disease - Father,Paternal Grandparents, No family history of Cancer, Hereditary Spherocytosis,  Kidney Disease, Seizures, Stroke, Thyroid Problems, Tuberculosis. Social History Former smoker, Marital Status - Single, Alcohol Use - Never, Drug Use - No History, Caffeine Use - Daily. Medical History Eyes Denies history of Cataracts, Glaucoma, Optic Neuritis Ear/Nose/Mouth/Throat Denies history of Chronic sinus problems/congestion, Middle ear problems Hematologic/Lymphatic Denies history of Anemia, Hemophilia, Human Immunodeficiency Virus, Lymphedema, Sickle Cell Disease Respiratory Patient has history of Sleep Apnea Denies history of Aspiration, Asthma, Chronic Obstructive Pulmonary Disease (COPD), Pneumothorax, Tuberculosis Cardiovascular Patient has history of Congestive Heart Failure, Coronary Artery Disease, Hypertension Denies history of Angina, Arrhythmia, Deep Vein Thrombosis, Hypotension, Myocardial Infarction, Peripheral Arterial Disease, Peripheral Venous Disease, Phlebitis, Vasculitis Gastrointestinal Denies history of Cirrhosis , Colitis, Crohnoos, Hepatitis A, Hepatitis B, Hepatitis C Endocrine Denies history of Type I Diabetes, Type II Diabetes Genitourinary Denies history of End Stage Renal Disease Immunological Denies history of Lupus Erythematosus, Raynaudoos, Scleroderma Integumentary (Skin) Denies history of History of Burn Musculoskeletal Denies history of Gout, Rheumatoid Arthritis, Osteoarthritis, Osteomyelitis Neurologic Denies history of Dementia, Neuropathy, Quadriplegia, Paraplegia, Seizure Disorder Oncologic Denies history of Received Chemotherapy, Received Radiation Psychiatric Denies history of Anorexia/bulimia, Confinement Anxiety Objective Constitutional respirations regular, non-labored and within target range for patient.. Vitals Time Taken: 8:54 AM, Height: 67 in, Weight: 346 lbs, BMI: 54.2, Temperature: 97.5 F, Pulse: 87 bpm, Respiratory Rate: 20 breaths/min, Blood Pressure: 147/93 mmHg, Capillary Blood Glucose: 120  mg/dl. Cardiovascular 2+ dorsalis pedis/posterior tibialis pulses. Psychiatric pleasant and cooperative. General Notes: Left lower extremity: On the medial aspect there is an open wound with granulation tissue present. No signs of infection. Good edema control. Integumentary (Hair, Skin) Wound #4 status is Open. Original cause of wound was Gradually Appeared. The date acquired was: 09/26/2020. The wound has been in treatment 20 weeks. The wound is located on the Left,Medial Lower Leg. The wound measures 2cm length x 1.2cm width x 0.2cm depth; 1.885cm^2 area and 0.377cm^3 volume. There is Fat Layer (Subcutaneous Tissue) exposed. There is no tunneling or undermining noted. There is a medium amount of serosanguineous drainage noted. The wound margin is distinct with the outline attached to the wound base. There is large (67-100%) red, pink granulation within the wound bed. There is a small (1-33%) amount of necrotic tissue  within the wound bed including Adherent Slough. Assessment Active Problems ICD-10 Non-pressure chronic ulcer of other part of left lower leg with unspecified severity Chronic venous hypertension (idiopathic) with ulcer of left lower extremity Lymphedema, not elsewhere classified Personal history of other venous thrombosis and embolism Patient's wound shows improvement in appearance since last clinic visit. I recommended continuing collagen under the 3 layer compression. At this time I would like to run a skin substitute to see if this is approved by insurance. This was discussed with the patient and she was agreeable. I Think she would do well with either epi fix or Grafix. Procedures Wound #4 Pre-procedure diagnosis of Wound #4 is a Venous Leg Ulcer located on the Left,Medial Lower Leg . There was a Three Layer Compression Therapy Procedure by [name], RN. Post procedure Diagnosis Wound #4: Same as Pre-Procedure Plan Follow-up Appointments: Return Appointment in 1  week. - Dr. Heber [place] Nurse Visit: - in 2 weeks Cellular or Tissue Based Products: Cellular or Tissue Based Product Type: - Run IVR for Grafix and EpiFix Bathing/ Shower/ Hygiene: May shower with protection but do not get wound dressing(s) wet. - use cast protector to keep wrap dry in the shower Edema Control - Lymphedema / SCD / Other: Lymphedema Pumps. Use Lymphedema pumps on leg(s) 2-3 times a day for 45-60 minutes. If wearing any wraps or hose, do not remove them. Continue exercising as instructed. Elevate legs to the level of the heart or above for 30 minutes daily and/or when sitting, a frequency of: Avoid standing for long periods of time. Patient to wear own compression stockings every day. - right leg daily Exercise regularly Off-Loading: Turn and reposition every 2 hours - Do not apply pressure to these areas. WOUND #4: - Lower Leg Wound Laterality: Left, Medial Cleanser: Soap and Water 1 x Per Week/15 Days Discharge Instructions: May shower and wash wound with dial antibacterial soap and water prior to dressing change. Peri-Wound Care: Triamcinolone 15 (g) 1 x Per Week/15 Days Discharge Instructions: Use triamcinolone 15 (g) as directed Peri-Wound Care: Sween Lotion (Moisturizing lotion) 1 x Per Week/15 Days Discharge Instructions: Apply moisturizing lotion as directed Prim Dressing: Promogran Prisma Matrix, 4.34 (sq in) (silver collagen) 1 x Per Week/15 Days ary Discharge Instructions: Moisten collagen with saline or hydrogel Secondary Dressing: Woven Gauze Sponge, Non-Sterile 4x4 in 1 x Per Week/15 Days Discharge Instructions: Apply over primary dressing as directed. Com pression Wrap: ThreePress (3 layer compression wrap) 1 x Per Week/15 Days Discharge Instructions: Apply three layer compression as directed. Com pression Wrap: Unnaboot w/Calamine, 4x10 (in/yd) 1 x Per Week/15 Days Discharge Instructions: Apply at top to anchor wrap 1. Run IVR for epi fix and Grafix 2.  Collagen under 3 layer compression 3. Follow-up in 1 week Electronic Signature(s) Signed: 03/09/2021 9:57:49 AM By: [name] DO Entered By: [name] on 03/09/2021 09:57:07 -------------------------------------------------------------------------------- HxROS Details Patient Name: Date of Service: [name], Shaquita D. 03/09/2021 8:45 A M Medical Record Number: [id number] Patient Account Number: [id number] Date of Birth/Sex: Treating RN: March 21, 1971 (50 y.o. [name] Primary Care Provider: [name] Other Clinician: Referring Provider: Treating Provider/Extender: Sammuel Bailiff in Treatment: 20 Information Obtained From Patient Eyes Medical History: Negative for: Cataracts; Glaucoma; Optic Neuritis Ear/Nose/Mouth/Throat Medical History: Negative for: Chronic sinus problems/congestion; Middle ear problems Hematologic/Lymphatic Medical History: Negative for: Anemia; Hemophilia; Human Immunodeficiency Virus; Lymphedema; Sickle Cell Disease Respiratory Medical History: Positive for: Sleep Apnea Negative for: Aspiration; Asthma; Chronic Obstructive Pulmonary  Disease (COPD); Pneumothorax; Tuberculosis Cardiovascular Medical History: Positive for: Congestive Heart Failure; Coronary Artery Disease; Hypertension Negative for: Angina; Arrhythmia; Deep Vein Thrombosis; Hypotension; Myocardial Infarction; Peripheral Arterial Disease; Peripheral Venous Disease; Phlebitis; Vasculitis Gastrointestinal Medical History: Negative for: Cirrhosis ; Colitis; Crohns; Hepatitis A; Hepatitis B; Hepatitis C Endocrine Medical History: Negative for: Type I Diabetes; Type II Diabetes Genitourinary Medical History: Negative for: End Stage Renal Disease Immunological Medical History: Negative for: Lupus Erythematosus; Raynauds; Scleroderma Integumentary (Skin) Medical History: Negative for: History of Burn Musculoskeletal Medical  History: Negative for: Gout; Rheumatoid Arthritis; Osteoarthritis; Osteomyelitis Neurologic Medical History: Negative for: Dementia; Neuropathy; Quadriplegia; Paraplegia; Seizure Disorder Oncologic Medical History: Negative for: Received Chemotherapy; Received Radiation Psychiatric Medical History: Negative for: Anorexia/bulimia; Confinement Anxiety Immunizations Pneumococcal Vaccine: Received Pneumococcal Vaccination: No Implantable Devices None Family and Social History Cancer: No; Diabetes: Yes - Mother,Maternal Grandparents; Heart Disease: Yes - Paternal Grandparents; Hereditary Spherocytosis: No; Hypertension: Yes - Mother,Maternal Grandparents; Kidney Disease: No; Lung Disease: Yes - Father,Paternal Grandparents; Seizures: No; Stroke: No; Thyroid Problems: No; Tuberculosis: No; Former smoker; Marital Status - Single; Alcohol Use: Never; Drug Use: No History; Caffeine Use: Daily; Financial Concerns: No; Food, Clothing or Shelter Needs: No; Support System Lacking: No; Transportation Concerns: No Electronic Signature(s) Signed: 03/09/2021 9:57:49 AM By: [name] DO Signed: 03/12/2021 4:40:13 PM By: Baruch Gouty RN, BSN Entered By: [name] on 03/09/2021 09:54:26 -------------------------------------------------------------------------------- [place] Details Patient Name: Date of Service: [name], Janaria D. 03/09/2021 Medical Record Number: [id number] Patient Account Number: [id number] Date of Birth/Sex: Treating RN: [birth year] (50 y.o. [name] Primary Care Provider: [name] Other Clinician: Referring Provider: Treating Provider/Extender: Sammuel Bailiff in Treatment: 20 Diagnosis Coding ICD-10 Codes Code Description [phone] Non-pressure chronic ulcer of other part of left lower leg with unspecified severity I87.312 Chronic venous hypertension (idiopathic) with ulcer of left lower extremity I89.0  Lymphedema, not elsewhere classified Z86.718 Personal history of other venous thrombosis and embolism Facility Procedures CPT4 Code: [id number] Description: (Facility Use Only) [phone] - [facility] ICD-10 Diagnosis Description L97.829 Non-pressure chronic ulcer of other part of left lower leg with unspecified severity I89.0 Lymphedema, not elsewhere classified Modifier: Quantity: 1 Physician Procedures : CPT4 Code Description Modifier [id number] - WC PHYS LEVEL 3 - EST PT ICD-10 Diagnosis Description L97.829 Non-pressure chronic ulcer of other part of left lower leg with unspecified severity I87.312 Chronic venous hypertension (idiopathic) with  ulcer of left lower extremity I89.0 Lymphedema, not elsewhere classified Z86.718 Personal history of other venous thrombosis and embolism Quantity: 1 Electronic Signature(s) Signed: 03/09/2021 9:57:49 AM By: [name] DO Entered By: [name] on 03/09/2021 09:57:28

## 2021-03-12 NOTE — Patient Instructions (Signed)
Ferric Carboxymaltose Injection °What is this medication? °FERRIC CARBOXYMALTOSE (FER ik kar BOX ee MAWL tose) treats low levels of iron in your body (iron deficiency anemia). Iron is a mineral that plays an important role in making red blood cells, which carry oxygen from your lungs to the rest of your body. °This medicine may be used for other purposes; ask your health care provider or pharmacist if you have questions. °COMMON BRAND NAME(S): Injectafer °What should I tell my care team before I take this medication? °They need to know if you have any of these conditions: °High blood pressure °High levels of iron in the blood °An unusual or allergic reaction to iron, other medications, foods, dyes, or preservatives °Pregnant or trying to get pregnant °Breast-feeding °How should I use this medication? °This medication is injected into a vein. It is given by your care team in a hospital or clinic setting. °Talk to your care team about the use of this medication in children. While it may be given to children as young as 1 year for selected conditions, precautions do apply. °Overdosage: If you think you have taken too much of this medicine contact a poison control center or emergency room at once. °NOTE: This medicine is only for you. Do not share this medicine with others. °What if I miss a dose? °Keep appointments for follow-up doses. It is important not to miss your dose. Call your care team if you are unable to keep an appointment. °What may interact with this medication? °Do not take this medication with any of the following medications: °Deferoxamine °Dimercaprol °Other iron products °This list may not describe all possible interactions. Give your health care provider a list of all the medicines, herbs, non-prescription drugs, or dietary supplements you use. Also tell them if you smoke, drink alcohol, or use illegal drugs. Some items may interact with your medicine. °What should I watch for while using this  medication? °Visit your care team for regular checks on your progress. Tell your care team if your symptoms do not start to get better or if they get worse. °You may need blood work while you are taking this medication. °You may need to eat more foods that contain iron. Talk to your care team. Foods that contain iron include whole grains/cereals, dried fruits, beans, or peas, leafy green vegetables, and organ meats (liver, kidney). °What side effects may I notice from receiving this medication? °Side effects that you should report to your care team as soon as possible: °Allergic reactions--skin rash, itching, hives, swelling of the face, lips, tongue, or throat °Increase in blood pressure °Low blood pressure--dizziness, feeling faint or lightheaded, blurry vision °Shortness of breath °Side effects that usually do not require medical attention (report to your care team if they continue or are bothersome): °Flushing °Headache °Joint pain °Muscle pain °Nausea °Pain, redness, or irritation at injection site °This list may not describe all possible side effects. Call your doctor for medical advice about side effects. You may report side effects to FDA at 1-800-FDA-1088. °Where should I keep my medication? °This medication is given in a hospital or clinic. It will not be stored at home. °NOTE: This sheet is a summary. It may not cover all possible information. If you have questions about this medicine, talk to your doctor, pharmacist, or health care provider. °© 2022 [employer] (2020-09-08 00:00:00) ° °

## 2021-03-12 NOTE — Progress Notes (Signed)
Pt declined to stay for 30 min observation post iron infusion. Pt discharged to lobby in stable condition, ambulatory, with AVS in hand. Pt agreeable to call with any concerns.

## 2021-03-13 NOTE — Progress Notes (Signed)
[name], [name] ([id number]) Visit Report for 03/09/2021 Arrival Information Details Patient Name: Date of Service: [name], [name] 03/09/2021 8:45 A M Medical Record Number: [id number] Patient Account Number: [id number] Date of Birth/Sex: Treating RN: October 30, 1970 (50 y.o. [name] Primary Care Brittane Grudzinski: [name] Other Clinician: Referring Tyron Manetta: Treating Luisdaniel Kenton/Extender: Sammuel Bailiff in Treatment: 20 Visit Information History Since Last Visit Added or deleted any medications: No Patient Arrived: Ambulatory Any new allergies or adverse reactions: No Arrival Time: 08:52 Had a fall or experienced change in No Accompanied By: self activities of daily living that may affect Transfer Assistance: None risk of falls: Patient Identification Verified: Yes Signs or symptoms of abuse/neglect since last visito No Secondary Verification Process Completed: Yes Hospitalized since last visit: No Patient Requires Transmission-Based Precautions: No Implantable device outside of the clinic excluding No Patient Has Alerts: Yes cellular tissue based products placed in the center Patient Alerts: ABI's: 11/21 L:1.17 since last visit: Has Dressing in Place as Prescribed: Yes Pain Present Now: No Electronic Signature(s) Signed: 03/13/2021 1:54:26 PM By: Sandre Kitty Entered By: Sandre Kitty on 03/09/2021 08:53:00 -------------------------------------------------------------------------------- Compression Therapy Details Patient Name: Date of Service: Megan Session D. 03/09/2021 8:45 A M Medical Record Number: [id number] Patient Account Number: [id number] Date of Birth/Sex: Treating RN: [birth year] (50 y.o. [name] Primary Care Shawntia Mangal: [name] Other Clinician: Referring Dariya Gainer: Treating Alejandria Wessells/Extender: Sammuel Bailiff in Treatment: 20 Compression Therapy Performed for Wound  Assessment: Wound #4 Left,Medial Lower Leg Performed By: Clinician [name], RN Compression Type: Three Layer Post Procedure Diagnosis Same as Pre-procedure Electronic Signature(s) Signed: 03/09/2021 12:16:14 PM By: [name] Entered By: [name] on 03/09/2021 09:26:48 -------------------------------------------------------------------------------- Encounter Discharge Information Details Patient Name: Date of Service: Megan Session D. 03/09/2021 8:45 A M Medical Record Number: [id number] Patient Account Number: [id number] Date of Birth/Sex: Treating RN: Jun 12, 1970 (50 y.o. [name] Primary Care Kanika Bungert: [name] Other Clinician: Referring Abri Vacca: Treating Eadie Repetto/Extender: Sammuel Bailiff in Treatment: 20 Encounter Discharge Information Items Discharge Condition: Stable Ambulatory Status: Ambulatory Discharge Destination: Home Transportation: Private Auto Schedule Follow-up Appointment: Yes Clinical Summary of Care: Provided on 03/09/2021 Form Type Recipient Paper Patient Patient Electronic Signature(s) Signed: 03/09/2021 12:16:14 PM By: [name] Entered By: [name] on 03/09/2021 09:45:20 -------------------------------------------------------------------------------- Lower Extremity Assessment Details Patient Name: Date of Service: Megan Session D. 03/09/2021 8:45 A M Medical Record Number: [id number] Patient Account Number: [id number] Date of Birth/Sex: Treating RN: [birth year] (50 y.o. [name] Primary Care Fatim Vanderschaaf: [name] Other Clinician: Referring Makena Murdock: Treating Harden Bramer/Extender: Sammuel Bailiff in Treatment: 20 Edema Assessment Assessed: [name]: Yes] [name]: No] Edema: [Left: Ye] [Right: s] Calf Left: Right: Point of Measurement: 25 cm From Medial Instep 38 cm Ankle Left: Right: Point of Measurement: 9 cm From Medial Instep 25  cm Vascular Assessment Pulses: Dorsalis Pedis Palpable: [Left:Yes] Electronic Signature(s) Signed: 03/09/2021 12:16:14 PM By: [name] Entered By: [name] on 03/09/2021 09:05:22 -------------------------------------------------------------------------------- Multi Wound Chart Details Patient Name: Date of Service: Megan Gilding, [name] 03/09/2021 8:45 A M Medical Record Number: [id number] Patient Account Number: [id number] Date of Birth/Sex: Treating RN: [birth year] (50 y.o. [name] Primary Care Ray Gervasi: [name] Other Clinician: Referring Shuntae Herzig: Treating Joylyn Duggin/Extender: Sammuel Bailiff in Treatment: 20 Vital Signs Height(in): 67 Capillary Blood Glucose(mg/dl): 120 Weight(lbs): 346 Pulse(bpm): 87 Body Mass Index(BMI): 54 Blood Pressure(mmHg): 147/93 Temperature(F): 97.5 Respiratory Rate(breaths/min):  20 Photos: [N/A:N/A] Left, Medial Lower Leg N/A N/A Wound Location: Gradually Appeared N/A N/A Wounding Event: Venous Leg Ulcer N/A N/A Primary Etiology: Sleep Apnea, Congestive Heart N/A N/A Comorbid History: Failure, Coronary Artery Disease, Hypertension 09/26/2020 N/A N/A Date Acquired: 20 N/A N/A Weeks of Treatment: Open N/A N/A Wound Status: 2x1.2x0.2 N/A N/A Measurements L x W x D (cm) 1.885 N/A N/A A (cm) : rea 0.377 N/A N/A Volume (cm) : 88.40% N/A N/A % Reduction in Area: 76.80% N/A N/A % Reduction in Volume: Full Thickness Without Exposed N/A N/A Classification: Support Structures Medium N/A N/A Exudate Amount: Serosanguineous N/A N/A Exudate Type: red, brown N/A N/A Exudate Color: Distinct, outline attached N/A N/A Wound Margin: Large (67-100%) N/A N/A Granulation Amount: Red, Pink N/A N/A Granulation Quality: Small (1-33%) N/A N/A Necrotic Amount: Fat Layer (Subcutaneous Tissue): Yes N/A N/A Exposed Structures: Fascia: No Tendon: No Muscle: No Joint: No Bone:  No Small (1-33%) N/A N/A Epithelialization: Compression Therapy N/A N/A Procedures Performed: Treatment Notes Wound #4 (Lower Leg) Wound Laterality: Left, Medial Cleanser Soap and Water Discharge Instruction: May shower and wash wound with dial antibacterial soap and water prior to dressing change. Peri-Wound Care Triamcinolone 15 (g) Discharge Instruction: Use triamcinolone 15 (g) as directed Sween Lotion (Moisturizing lotion) Discharge Instruction: Apply moisturizing lotion as directed Topical Primary Dressing Promogran Prisma Matrix, 4.34 (sq in) (silver collagen) Discharge Instruction: Moisten collagen with saline or hydrogel Secondary Dressing Woven Gauze Sponge, Non-Sterile 4x4 in Discharge Instruction: Apply over primary dressing as directed. Secured With Compression Wrap ThreePress (3 layer compression wrap) Discharge Instruction: Apply three layer compression as directed. Unnaboot w/Calamine, 4x10 (in/yd) Discharge Instruction: Apply at top to anchor wrap Compression Stockings Add-Ons Electronic Signature(s) Signed: 03/09/2021 9:57:49 AM By: [name] DO Signed: 03/12/2021 4:40:13 PM By: Baruch Gouty RN, BSN Entered By: [name] on 03/09/2021 09:53:44 -------------------------------------------------------------------------------- [place] Details Patient Name: Date of Service: Megan Session D. 03/09/2021 8:45 A M Medical Record Number: [id number] Patient Account Number: [id number] Date of Birth/Sex: Treating RN: [birth year] (50 y.o. [name] Primary Care Kolbee Stallman: [name] Other Clinician: Referring Shanteria Laye: Treating Cecille Mcclusky/Extender: Sammuel Bailiff in Treatment: 20 Multidisciplinary Care Plan reviewed with physician Active Inactive Venous Leg Ulcer Nursing Diagnoses: Actual venous Insuffiency (use after diagnosis is confirmed) Knowledge deficit related to disease process  and management Potential for venous Insuffiency (use before diagnosis confirmed) Goals: Patient will maintain optimal edema control Date Initiated: 10/20/2020 Target Resolution Date: 03/23/2021 Goal Status: Active Interventions: Assess peripheral edema status every visit. Compression as ordered Treatment Activities: Therapeutic compression applied : 10/20/2020 Notes: 12/28/20: Edema control ongoing. Wound/Skin Impairment Nursing Diagnoses: Impaired tissue integrity Knowledge deficit related to ulceration/compromised skin integrity Goals: Patient/caregiver will verbalize understanding of skin care regimen Date Initiated: 10/20/2020 Target Resolution Date: 03/23/2021 Goal Status: Active Ulcer/skin breakdown will have a volume reduction of 30% by week 4 Date Initiated: 10/20/2020 Date Inactivated: 12/14/2020 Target Resolution Date: 11/24/2020 Goal Status: Met Ulcer/skin breakdown will have a volume reduction of 80% by week 12 Date Initiated: 12/14/2020 Date Inactivated: 01/23/2021 Target Resolution Date: 01/18/2021 Goal Status: Met Ulcer/skin breakdown will heal within 14 weeks Date Initiated: 01/23/2021 Date Inactivated: 02/23/2021 Target Resolution Date: 02/23/2021 Goal Status: Unmet Unmet Reason: infection Interventions: Assess patient/caregiver ability to obtain necessary supplies Assess patient/caregiver ability to perform ulcer/skin care regimen upon admission and as needed Assess ulceration(s) every visit Provide education on ulcer and skin care Treatment Activities: Skin care regimen initiated : 10/20/2020 Topical  wound management initiated : 10/20/2020 Notes: Electronic Signature(s) Signed: 03/09/2021 12:16:14 PM By: [name] Entered By: [name] on 03/09/2021 09:08:12 -------------------------------------------------------------------------------- Pain Assessment Details Patient Name: Date of Service: Megan Session D. 03/09/2021 8:45 A M Medical  Record Number: [id number] Patient Account Number: [id number] Date of Birth/Sex: Treating RN: 1970/06/08 (50 y.o. [name] Primary Care Kryssa Risenhoover: [name] Other Clinician: Referring Jamael Hoffmann: Treating Rossie Bretado/Extender: Sammuel Bailiff in Treatment: 20 Active Problems Location of Pain Severity and Description of Pain Patient Has Paino No Site Locations Pain Management and Medication Current Pain Management: Electronic Signature(s) Signed: 03/12/2021 4:40:13 PM By: Baruch Gouty RN, BSN Signed: 03/13/2021 1:54:26 PM By: Sandre Kitty Entered By: Sandre Kitty on 03/09/2021 08:55:03 -------------------------------------------------------------------------------- Patient/Caregiver Education Details Patient Name: Date of Service: Megan Session D. 11/11/2022andnbsp8:45 A M Medical Record Number: [id number] Patient Account Number: [id number] Date of Birth/Gender: Treating RN: 09/08/1970 (50 y.o. [name] Primary Care Physician: [name] Other Clinician: Referring Physician: Treating Physician/Extender: Sammuel Bailiff in Treatment: 20 Education Assessment Education Provided To: Patient Education Topics Provided Venous: Methods: Demonstration, Explain/Verbal, Printed Responses: State content correctly Wound/Skin Impairment: Methods: Demonstration, Explain/Verbal, Printed Responses: State content correctly Electronic Signature(s) Signed: 03/09/2021 12:16:14 PM By: [name] Entered By: [name] on 03/09/2021 09:08:35 -------------------------------------------------------------------------------- Wound Assessment Details Patient Name: Date of Service: Megan Session D. 03/09/2021 8:45 A M Medical Record Number: [id number] Patient Account Number: [id number] Date of Birth/Sex: Treating RN: [birth year] (50 y.o. [name] Primary Care Fadi Menter: [name] Other Clinician: Referring Nabila Albarracin: Treating Ashleynicole Mcclees/Extender: Sammuel Bailiff in Treatment: 20 Wound Status Wound Number: 4 Primary Venous Leg Ulcer Etiology: Wound Location: Left, Medial Lower Leg Wound Open Wounding Event: Gradually Appeared Status: Date Acquired: 09/26/2020 Comorbid Sleep Apnea, Congestive Heart Failure, Coronary Artery Weeks Of Treatment: 20 History: Disease, Hypertension Clustered Wound: No Photos Wound Measurements Length: (cm) 2 Width: (cm) 1.2 Depth: (cm) 0.2 Area: (cm) 1.885 Volume: (cm) 0.377 % Reduction in Area: 88.4% % Reduction in Volume: 76.8% Epithelialization: Small (1-33%) Tunneling: No Undermining: No Wound Description Classification: Full Thickness Without Exposed Support Structures Wound Margin: Distinct, outline attached Exudate Amount: Medium Exudate Type: Serosanguineous Exudate Color: red, brown Foul Odor After Cleansing: No Slough/Fibrino Yes Wound Bed Granulation Amount: Large (67-100%) Exposed Structure Granulation Quality: Red, Pink Fascia Exposed: No Necrotic Amount: Small (1-33%) Fat Layer (Subcutaneous Tissue) Exposed: Yes Necrotic Quality: Adherent Slough Tendon Exposed: No Muscle Exposed: No Joint Exposed: No Bone Exposed: No Treatment Notes Wound #4 (Lower Leg) Wound Laterality: Left, Medial Cleanser Soap and Water Discharge Instruction: May shower and wash wound with dial antibacterial soap and water prior to dressing change. Peri-Wound Care Triamcinolone 15 (g) Discharge Instruction: Use triamcinolone 15 (g) as directed Sween Lotion (Moisturizing lotion) Discharge Instruction: Apply moisturizing lotion as directed Topical Primary Dressing Promogran Prisma Matrix, 4.34 (sq in) (silver collagen) Discharge Instruction: Moisten collagen with saline or hydrogel Secondary Dressing Woven Gauze Sponge, Non-Sterile 4x4 in Discharge Instruction: Apply over primary dressing  as directed. Secured With Compression Wrap ThreePress (3 layer compression wrap) Discharge Instruction: Apply three layer compression as directed. Unnaboot w/Calamine, 4x10 (in/yd) Discharge Instruction: Apply at top to anchor wrap Compression Stockings Add-Ons Electronic Signature(s) Signed: 03/09/2021 12:16:14 PM By: [name] Signed: 03/12/2021 4:40:13 PM By: Baruch Gouty RN, BSN Entered By: [name] on 03/09/2021 09:07:42 -------------------------------------------------------------------------------- [place] Details Patient Name: Date of Service: Megan Gilding, Zurii D. 03/09/2021 8:45 A M Medical  Record Number: [id number] Patient Account Number: [id number] Date of Birth/Sex: Treating RN: 27-Feb-1971 (50 y.o. [name] Primary Care Samirah Scarpati: [name] Other Clinician: Referring [place]: Treating Greer Wainright/Extender: Sammuel Bailiff in Treatment: 20 Vital Signs Time Taken: 08:54 Temperature (F): 97.5 Height (in): 67 Pulse (bpm): 87 Weight (lbs): 346 Respiratory Rate (breaths/min): 20 Body Mass Index (BMI): 54.2 Blood Pressure (mmHg): 147/93 Capillary Blood Glucose (mg/dl): 120 Reference Range: 80 - 120 mg / dl Electronic Signature(s) Signed: 03/13/2021 1:54:26 PM By: Sandre Kitty Entered By: Sandre Kitty on 03/09/2021 08:54:57

## 2021-03-16 ENCOUNTER — Other Ambulatory Visit: Payer: Self-pay

## 2021-03-16 ENCOUNTER — Encounter (HOSPITAL_BASED_OUTPATIENT_CLINIC_OR_DEPARTMENT_OTHER): Payer: Medicaid Other | Admitting: Internal Medicine

## 2021-03-16 DIAGNOSIS — L97829 Non-pressure chronic ulcer of other part of left lower leg with unspecified severity: Secondary | ICD-10-CM | POA: Diagnosis not present

## 2021-03-16 DIAGNOSIS — I87312 Chronic venous hypertension (idiopathic) with ulcer of left lower extremity: Secondary | ICD-10-CM | POA: Diagnosis not present

## 2021-03-16 DIAGNOSIS — I890 Lymphedema, not elsewhere classified: Secondary | ICD-10-CM | POA: Diagnosis not present

## 2021-03-19 NOTE — Progress Notes (Signed)
[name], [name] ([id number]) Visit Report for 03/16/2021 Chief Complaint Document Details Patient Name: Date of Service: [name], [name] 03/16/2021 9:45 A M Medical Record Number: [id number] Patient Account Number: [id number] Date of Birth/Sex: Treating RN: [birth year] (50 y.o. [name] Primary Care Provider: [name] Other Clinician: Referring Provider: Treating Provider/Extender: Sammuel Bailiff in Treatment: 21 Information Obtained from: Patient Chief Complaint Left lower extremity wound Electronic Signature(s) Signed: 03/16/2021 10:58:03 AM By: [name] DO Entered By: [name] on 03/16/2021 10:52:49 -------------------------------------------------------------------------------- HPI Details Patient Name: Date of Service: [name], [name] 03/16/2021 9:45 A M Medical Record Number: [id number] Patient Account Number: [id number] Date of Birth/Sex: Treating RN: 05-29-1970 (50 y.o. [name] Primary Care Provider: [name] Other Clinician: Referring Provider: Treating Provider/Extender: Sammuel Bailiff in Treatment: 21 History of Present Illness HPI Description: Admission 6/24 Ms. Camren Henthorn is a 50 year old female with a past medical history of chronic venous insufficiency, lymphedema, DVT on anticoagulation and diastolic heart failure that presents to the clinic for left lower extremity wound. She was last seen 4 months ago in our clinic for the same issue. The reoccurring wound started at the end of May and has been going on for 1 month. She has been using an ointment and I am unclear what this is. She tries to keep her leg elevated with her compression stocking. She also reports she has lymphedema pumps and has been using them as well. She reports mild pain to the area. She denies signs of infection including increased warmth, erythema or purulent drainage. 7/1;  patient presents for 1 week follow-up. She has tolerated the wrap well. Unfortunately she did have trouble with the wrap Sliding down her leg 2 days ago. She has no issues or complaints today. She denies signs of infection. 7/15; patient presents for follow-up. She has tolerated the wrap well. She has no issues or complaints today. She denies signs of infection. 7/25; patient presents for 1 week follow-up. She has no issues or complaints today. She denies signs of infection. 8/4; patient presents for follow-up. She has tolerated the compression wrap well to her left lower extremity. She states she was in the hospital last week due to fluid overload. She subsequently developed blisters to her bottom from being in the hospital bed for prolonged periods of time. These have since ruptured. She now has 2 areas of skin breakdown. She has been keeping gauze on them. She denies signs of infection. 8/18; patient presents for 2-week follow-up. She reports improvement to her buttocks wounds. She is able to tolerate the wrap well to her left lower extremity with no issues. She denies signs of infection. 9/1; patient presents for 2-week follow-up. She has no issues or complaints today. she denies signs of infection. 9/15; patient presents for 2-week follow-up. She has no issues or complaints today. She has tolerated the wrap well. She denies signs of infection. 9/27; patient presents for 1 week follow-up. She has no issues or complaints today. 10/20; patient presents for follow-up. She was on vacation for the past 3 weeks. She states she had the last clinic visit wrap in place for 1 week and then she took this off. She states that she has been using her compression wraps since. She states that the wound has gotten larger. She reports pain to the wound site. She denies fever/chills, nausea/vomiting. She denies purulent drainage. 10/24; patient presents for follow-up. She states she tolerated the Kerlix/Coban  wrap  well. She reports decrease in pain. She states she started Keflex. She denies systemic signs of infection. 10/28; patient presents for follow-up. She has no issues or complaints today. She denies acute pain. She tolerated the wrap well. 11/4; patient presents for follow-up. She has no issues or complaints. She denies signs of infection. 11/11; patient presents for follow-up. She has no issues or complaints today. She denies signs of infection. She tolerated the wrap well. 11/18; patient presents for follow-up. She states she took a shower however despite a plastic bag the wrap got wet. She currently denies signs of infection. Electronic Signature(s) Signed: 03/16/2021 10:58:03 AM By: [name] DO Entered By: [name] on 03/16/2021 10:53:38 -------------------------------------------------------------------------------- Physical Exam Details Patient Name: Date of Service: Megan Session D. 03/16/2021 9:45 A M Medical Record Number: [id number] Patient Account Number: [id number] Date of Birth/Sex: Treating RN: 08/27/1970 (50 y.o. [name] Primary Care Provider: [name] Other Clinician: Referring Provider: Treating Provider/Extender: Sammuel Bailiff in Treatment: 21 Constitutional respirations regular, non-labored and within target range for patient.. Cardiovascular 2+ dorsalis pedis/posterior tibialis pulses. Psychiatric pleasant and cooperative. Notes Left lower extremity: On the medial aspect there is an open wound with granulation tissue present. No signs of infection. Good edema control. Electronic Signature(s) Signed: 03/16/2021 10:58:03 AM By: [name] DO Entered By: [name] on 03/16/2021 10:54:41 -------------------------------------------------------------------------------- Physician Orders Details Patient Name: Date of Service: Megan Session D. 03/16/2021 9:45 A M Medical Record Number:  [id number] Patient Account Number: [id number] Date of Birth/Sex: Treating RN: 07-15-1970 (50 y.o. [name], [name] Primary Care Provider: [name] Other Clinician: Referring Provider: Treating Provider/Extender: Sammuel Bailiff in Treatment: 21 Verbal / Phone Orders: No Diagnosis Coding ICD-10 Coding Code Description [phone] Non-pressure chronic ulcer of other part of left lower leg with unspecified severity I87.312 Chronic venous hypertension (idiopathic) with ulcer of left lower extremity I89.0 Lymphedema, not elsewhere classified Z86.718 Personal history of other venous thrombosis and embolism Follow-up Appointments ppointment in 2 weeks. - Dr. Heber [place] 03/30/2021 Return A Nurse Visit: - 03/20/2021 Tuesday Cellular or Tissue Based Products Cellular or Tissue Based Product Type: - Run IVR for Grafix and EpiFix- pending 03/16/2021 Bathing/ Shower/ Hygiene May shower with protection but do not get wound dressing(s) wet. - use cast protector to keep wrap dry in the shower Edema Control - Lymphedema / SCD / Other Bilateral Lower Extremities Lymphedema Pumps. Use Lymphedema pumps on leg(s) 2-3 times a day for 45-60 minutes. If wearing any wraps or hose, do not remove them. Continue exercising as instructed. Elevate legs to the level of the heart or above for 30 minutes daily and/or when sitting, a frequency of: Avoid standing for long periods of time. Patient to wear own compression stockings every day. - right leg daily Exercise regularly Off-Loading Turn and reposition every 2 hours - Do not apply pressure to these areas. Wound Treatment Wound #4 - Lower Leg Wound Laterality: Left, Medial Cleanser: Soap and Water 1 x Per Week/15 Days Discharge Instructions: May shower and wash wound with dial antibacterial soap and water prior to dressing change. Peri-Wound Care: Triamcinolone 15 (g) 1 x Per Week/15 Days Discharge Instructions: Use triamcinolone 15 (g)  as directed Peri-Wound Care: Sween Lotion (Moisturizing lotion) 1 x Per Week/15 Days Discharge Instructions: Apply moisturizing lotion as directed Prim Dressing: Promogran Prisma Matrix, 4.34 (sq in) (silver collagen) 1 x Per Week/15 Days ary Discharge Instructions: Moisten collagen with saline or hydrogel  Secondary Dressing: Woven Gauze Sponge, Non-Sterile 4x4 in 1 x Per Week/15 Days Discharge Instructions: Apply over primary dressing as directed. Compression Wrap: ThreePress (3 layer compression wrap) 1 x Per Week/15 Days Discharge Instructions: Apply three layer compression as directed. Compression Wrap: Unnaboot w/Calamine, 4x10 (in/yd) 1 x Per Week/15 Days Discharge Instructions: Apply at top to anchor wrap Electronic Signature(s) Signed: 03/16/2021 10:58:03 AM By: [name] DO Entered By: [name] on 03/16/2021 10:54:55 -------------------------------------------------------------------------------- Problem List Details Patient Name: Date of Service: [name], [name] 03/16/2021 9:45 A M Medical Record Number: [id number] Patient Account Number: [id number] Date of Birth/Sex: Treating RN: [birth year] (50 y.o. [name], Tammi Klippel Primary Care Provider: [name] Other Clinician: Referring Provider: Treating Provider/Extender: Sammuel Bailiff in Treatment: 21 Active Problems ICD-10 Encounter Code Description Active Date MDM Diagnosis L97.829 Non-pressure chronic ulcer of other part of left lower leg with unspecified 10/20/2020 No Yes severity I87.312 Chronic venous hypertension (idiopathic) with ulcer of left lower extremity 03/09/2021 No Yes I89.0 Lymphedema, not elsewhere classified 10/20/2020 No Yes Z86.718 Personal history of other venous thrombosis and embolism 10/20/2020 No Yes Inactive Problems Resolved Problems Electronic Signature(s) Signed: 03/16/2021 10:58:03 AM By: [name] DO Entered By: [name] on  03/16/2021 10:52:19 -------------------------------------------------------------------------------- Progress Note Details Patient Name: Date of Service: [name], [name] 03/16/2021 9:45 A M Medical Record Number: [id number] Patient Account Number: [id number] Date of Birth/Sex: Treating RN: [birth year] (50 y.o. [name] Primary Care Provider: [name] Other Clinician: Referring Provider: Treating Provider/Extender: Sammuel Bailiff in Treatment: 21 Subjective Chief Complaint Information obtained from Patient Left lower extremity wound History of Present Illness (HPI) Admission 6/24 Ms. Loucinda Croy is a 50 year old female with a past medical history of chronic venous insufficiency, lymphedema, DVT on anticoagulation and diastolic heart failure that presents to the clinic for left lower extremity wound. She was last seen 4 months ago in our clinic for the same issue. The reoccurring wound started at the end of May and has been going on for 1 month. She has been using an ointment and I am unclear what this is. She tries to keep her leg elevated with her compression stocking. She also reports she has lymphedema pumps and has been using them as well. She reports mild pain to the area. She denies signs of infection including increased warmth, erythema or purulent drainage. 7/1; patient presents for 1 week follow-up. She has tolerated the wrap well. Unfortunately she did have trouble with the wrap Sliding down her leg 2 days ago. She has no issues or complaints today. She denies signs of infection. 7/15; patient presents for follow-up. She has tolerated the wrap well. She has no issues or complaints today. She denies signs of infection. 7/25; patient presents for 1 week follow-up. She has no issues or complaints today. She denies signs of infection. 8/4; patient presents for follow-up. She has tolerated the compression wrap well to her left lower  extremity. She states she was in the hospital last week due to fluid overload. She subsequently developed blisters to her bottom from being in the hospital bed for prolonged periods of time. These have since ruptured. She now has 2 areas of skin breakdown. She has been keeping gauze on them. She denies signs of infection. 8/18; patient presents for 2-week follow-up. She reports improvement to her buttocks wounds. She is able to tolerate the wrap well to her left lower extremity with no issues. She denies signs of  infection. 9/1; patient presents for 2-week follow-up. She has no issues or complaints today. she denies signs of infection. 9/15; patient presents for 2-week follow-up. She has no issues or complaints today. She has tolerated the wrap well. She denies signs of infection. 9/27; patient presents for 1 week follow-up. She has no issues or complaints today. 10/20; patient presents for follow-up. She was on vacation for the past 3 weeks. She states she had the last clinic visit wrap in place for 1 week and then she took this off. She states that she has been using her compression wraps since. She states that the wound has gotten larger. She reports pain to the wound site. She denies fever/chills, nausea/vomiting. She denies purulent drainage. 10/24; patient presents for follow-up. She states she tolerated the Kerlix/Coban wrap well. She reports decrease in pain. She states she started Keflex. She denies systemic signs of infection. 10/28; patient presents for follow-up. She has no issues or complaints today. She denies acute pain. She tolerated the wrap well. 11/4; patient presents for follow-up. She has no issues or complaints. She denies signs of infection. 11/11; patient presents for follow-up. She has no issues or complaints today. She denies signs of infection. She tolerated the wrap well. 11/18; patient presents for follow-up. She states she took a shower however despite a plastic bag the  wrap got wet. She currently denies signs of infection. Patient History Information obtained from Patient. Family History Diabetes - Mother,Maternal Grandparents, Heart Disease - Paternal Grandparents, Hypertension - Mother,Maternal Grandparents, Lung Disease - Father,Paternal Grandparents, No family history of Cancer, Hereditary Spherocytosis, Kidney Disease, Seizures, Stroke, Thyroid Problems, Tuberculosis. Social History Former smoker, Marital Status - Single, Alcohol Use - Never, Drug Use - No History, Caffeine Use - Daily. Medical History Eyes Denies history of Cataracts, Glaucoma, Optic Neuritis Ear/Nose/Mouth/Throat Denies history of Chronic sinus problems/congestion, Middle ear problems Hematologic/Lymphatic Denies history of Anemia, Hemophilia, Human Immunodeficiency Virus, Lymphedema, Sickle Cell Disease Respiratory Patient has history of Sleep Apnea Denies history of Aspiration, Asthma, Chronic Obstructive Pulmonary Disease (COPD), Pneumothorax, Tuberculosis Cardiovascular Patient has history of Congestive Heart Failure, Coronary Artery Disease, Hypertension Denies history of Angina, Arrhythmia, Deep Vein Thrombosis, Hypotension, Myocardial Infarction, Peripheral Arterial Disease, Peripheral Venous Disease, Phlebitis, Vasculitis Gastrointestinal Denies history of Cirrhosis , Colitis, Crohnoos, Hepatitis A, Hepatitis B, Hepatitis C Endocrine Denies history of Type I Diabetes, Type II Diabetes Genitourinary Denies history of End Stage Renal Disease Immunological Denies history of Lupus Erythematosus, Raynaudoos, Scleroderma Integumentary (Skin) Denies history of History of Burn Musculoskeletal Denies history of Gout, Rheumatoid Arthritis, Osteoarthritis, Osteomyelitis Neurologic Denies history of Dementia, Neuropathy, Quadriplegia, Paraplegia, Seizure Disorder Oncologic Denies history of Received Chemotherapy, Received Radiation Psychiatric Denies history of  Anorexia/bulimia, Confinement Anxiety Objective Constitutional respirations regular, non-labored and within target range for patient.. Vitals Time Taken: 10:00 AM, Height: 67 in, Weight: 346 lbs, BMI: 54.2, Temperature: 98.3 F, Pulse: 101 bpm, Respiratory Rate: 20 breaths/min, Blood Pressure: 130/80 mmHg, Capillary Blood Glucose: 130 mg/dl. Cardiovascular 2+ dorsalis pedis/posterior tibialis pulses. Psychiatric pleasant and cooperative. General Notes: Left lower extremity: On the medial aspect there is an open wound with granulation tissue present. No signs of infection. Good edema control. Integumentary (Hair, Skin) Wound #4 status is Open. Original cause of wound was Gradually Appeared. The date acquired was: 09/26/2020. The wound has been in treatment 21 weeks. The wound is located on the Left,Medial Lower Leg. The wound measures 1.3cm length x 0.8cm width x 0.1cm depth; 0.817cm^2 area and 0.082cm^3 volume. There is  Fat Layer (Subcutaneous Tissue) exposed. There is no tunneling or undermining noted. There is a medium amount of serosanguineous drainage noted. The wound margin is distinct with the outline attached to the wound base. There is large (67-100%) red, pink granulation within the wound bed. There is no necrotic tissue within the wound bed. Assessment Active Problems ICD-10 Non-pressure chronic ulcer of other part of left lower leg with unspecified severity Chronic venous hypertension (idiopathic) with ulcer of left lower extremity Lymphedema, not elsewhere classified Personal history of other venous thrombosis and embolism Patient's wound has shown improvement in size and appearance since last clinic visit. No signs of infection on exam. IVR for Grafix and epi fix is still pending approval. I recommended continuing with collagen under 3 layer compression. I recommended a cast protector for going in the shower. Procedures Wound #4 Pre-procedure diagnosis of Wound #4 is a Venous  Leg Ulcer located on the Left,Medial Lower Leg . There was a Three Layer Compression Therapy Procedure by Deon Pilling, RN. Post procedure Diagnosis Wound #4: Same as Pre-Procedure Plan Follow-up Appointments: Return Appointment in 2 weeks. - Dr. Heber [place] 03/30/2021 Nurse Visit: - 03/20/2021 Tuesday Cellular or Tissue Based Products: Cellular or Tissue Based Product Type: - Run IVR for Grafix and EpiFix- pending 03/16/2021 Bathing/ Shower/ Hygiene: May shower with protection but do not get wound dressing(s) wet. - use cast protector to keep wrap dry in the shower Edema Control - Lymphedema / SCD / Other: Lymphedema Pumps. Use Lymphedema pumps on leg(s) 2-3 times a day for 45-60 minutes. If wearing any wraps or hose, do not remove them. Continue exercising as instructed. Elevate legs to the level of the heart or above for 30 minutes daily and/or when sitting, a frequency of: Avoid standing for long periods of time. Patient to wear own compression stockings every day. - right leg daily Exercise regularly Off-Loading: Turn and reposition every 2 hours - Do not apply pressure to these areas. WOUND #4: - Lower Leg Wound Laterality: Left, Medial Cleanser: Soap and Water 1 x Per Week/15 Days Discharge Instructions: May shower and wash wound with dial antibacterial soap and water prior to dressing change. Peri-Wound Care: Triamcinolone 15 (g) 1 x Per Week/15 Days Discharge Instructions: Use triamcinolone 15 (g) as directed Peri-Wound Care: Sween Lotion (Moisturizing lotion) 1 x Per Week/15 Days Discharge Instructions: Apply moisturizing lotion as directed Prim Dressing: Promogran Prisma Matrix, 4.34 (sq in) (silver collagen) 1 x Per Week/15 Days ary Discharge Instructions: Moisten collagen with saline or hydrogel Secondary Dressing: Woven Gauze Sponge, Non-Sterile 4x4 in 1 x Per Week/15 Days Discharge Instructions: Apply over primary dressing as directed. Com pression Wrap: ThreePress (3 layer  compression wrap) 1 x Per Week/15 Days Discharge Instructions: Apply three layer compression as directed. Com pression Wrap: Unnaboot w/Calamine, 4x10 (in/yd) 1 x Per Week/15 Days Discharge Instructions: Apply at top to anchor wrap 1. Collagen under 3 layer compression 2. Follow-up next week for nurse visit and in 2 weeks with me Electronic Signature(s) Signed: 03/16/2021 10:58:03 AM By: [name] DO Entered By: [name] on 03/16/2021 10:56:59 -------------------------------------------------------------------------------- HxROS Details Patient Name: Date of Service: [name], [name] 03/16/2021 9:45 A M Medical Record Number: [id number] Patient Account Number: [id number] Date of Birth/Sex: Treating RN: 1971-04-20 (50 y.o. [name] Primary Care Provider: [name] Other Clinician: Referring Provider: Treating Provider/Extender: Sammuel Bailiff in Treatment: 21 Information Obtained From Patient Eyes Medical History: Negative for: Cataracts; Glaucoma; Optic Neuritis Ear/Nose/Mouth/Throat Medical  History: Negative for: Chronic sinus problems/congestion; Middle ear problems Hematologic/Lymphatic Medical History: Negative for: Anemia; Hemophilia; Human Immunodeficiency Virus; Lymphedema; Sickle Cell Disease Respiratory Medical History: Positive for: Sleep Apnea Negative for: Aspiration; Asthma; Chronic Obstructive Pulmonary Disease (COPD); Pneumothorax; Tuberculosis Cardiovascular Medical History: Positive for: Congestive Heart Failure; Coronary Artery Disease; Hypertension Negative for: Angina; Arrhythmia; Deep Vein Thrombosis; Hypotension; Myocardial Infarction; Peripheral Arterial Disease; Peripheral Venous Disease; Phlebitis; Vasculitis Gastrointestinal Medical History: Negative for: Cirrhosis ; Colitis; Crohns; Hepatitis A; Hepatitis B; Hepatitis C Endocrine Medical History: Negative for: Type I Diabetes; Type  II Diabetes Genitourinary Medical History: Negative for: End Stage Renal Disease Immunological Medical History: Negative for: Lupus Erythematosus; Raynauds; Scleroderma Integumentary (Skin) Medical History: Negative for: History of Burn Musculoskeletal Medical History: Negative for: Gout; Rheumatoid Arthritis; Osteoarthritis; Osteomyelitis Neurologic Medical History: Negative for: Dementia; Neuropathy; Quadriplegia; Paraplegia; Seizure Disorder Oncologic Medical History: Negative for: Received Chemotherapy; Received Radiation Psychiatric Medical History: Negative for: Anorexia/bulimia; Confinement Anxiety Immunizations Pneumococcal Vaccine: Received Pneumococcal Vaccination: No Implantable Devices None Family and Social History Cancer: No; Diabetes: Yes - Mother,Maternal Grandparents; Heart Disease: Yes - Paternal Grandparents; Hereditary Spherocytosis: No; Hypertension: Yes - Mother,Maternal Grandparents; Kidney Disease: No; Lung Disease: Yes - Father,Paternal Grandparents; Seizures: No; Stroke: No; Thyroid Problems: No; Tuberculosis: No; Former smoker; Marital Status - Single; Alcohol Use: Never; Drug Use: No History; Caffeine Use: Daily; Financial Concerns: No; Food, Clothing or Shelter Needs: No; Support System Lacking: No; Transportation Concerns: No Electronic Signature(s) Signed: 03/16/2021 10:58:03 AM By: [name] DO Signed: 03/19/2021 4:18:59 PM By: Baruch Gouty RN, BSN Entered By: [name] on 03/16/2021 10:53:50 -------------------------------------------------------------------------------- [place] Details Patient Name: Date of Service: [name], [name] 03/16/2021 Medical Record Number: [id number] Patient Account Number: [id number] Date of Birth/Sex: Treating RN: 01-Feb-1971 (50 y.o. [name] Primary Care Provider: [name] Other Clinician: Referring Provider: Treating Provider/Extender: Sammuel Bailiff in Treatment: 21 Diagnosis Coding ICD-10 Codes Code Description [phone] Non-pressure chronic ulcer of other part of left lower leg with unspecified severity I87.312 Chronic venous hypertension (idiopathic) with ulcer of left lower extremity I89.0 Lymphedema, not elsewhere classified Z86.718 Personal history of other venous thrombosis and embolism Facility Procedures CPT4 Code: [id number] Description: (Facility Use Only) [phone] - [facility] Modifier: Quantity: 1 Physician Procedures Electronic Signature(s) Signed: 03/16/2021 10:58:03 AM By: [name] DO Entered By: [name] on 03/16/2021 10:57:27

## 2021-03-19 NOTE — Progress Notes (Signed)
[name], [name] ([id number]) Visit Report for 03/16/2021 Arrival Information Details Patient Name: Date of Service: [name], [name] 03/16/2021 9:45 A M Medical Record Number: [id number] Patient Account Number: [id number] Date of Birth/Sex: Treating RN: October 22, 1970 (50 y.o. [name], [name] Primary Care Isiac Breighner: [name] Other Clinician: Referring Ameri Cahoon: Treating Antoinetta Berrones/Extender: Sammuel Bailiff in Treatment: 21 Visit Information History Since Last Visit Added or deleted any medications: No Patient Arrived: Ambulatory Any new allergies or adverse reactions: No Arrival Time: 10:00 Had a fall or experienced change in No Accompanied By: self activities of daily living that may affect Transfer Assistance: None risk of falls: Patient Identification Verified: Yes Signs or symptoms of abuse/neglect since last visito No Secondary Verification Process Completed: Yes Hospitalized since last visit: No Patient Requires Transmission-Based Precautions: No Implantable device outside of the clinic excluding No Patient Has Alerts: Yes cellular tissue based products placed in the center Patient Alerts: ABI's: 11/21 L:1.17 since last visit: Has Dressing in Place as Prescribed: Yes Has Compression in Place as Prescribed: Yes Pain Present Now: No Notes the wrap slid down. Electronic Signature(s) Signed: 03/16/2021 12:13:12 PM By: Deon Pilling RN, BSN Entered By: Deon Pilling on 03/16/2021 10:05:35 -------------------------------------------------------------------------------- Compression Therapy Details Patient Name: Date of Service: Megan Session D. 03/16/2021 9:45 A M Medical Record Number: [id number] Patient Account Number: [id number] Date of Birth/Sex: Treating RN: Jun 10, 1970 (50 y.o. [name] Primary Care Clerance Umland: [name] Other Clinician: Referring Ricci Dirocco: Treating Nakiesha Rumsey/Extender: Sammuel Bailiff in Treatment: 21 Compression Therapy Performed for Wound Assessment: Wound #4 Left,Medial Lower Leg Performed By: Clinician Deon Pilling, RN Compression Type: Three Layer Post Procedure Diagnosis Same as Pre-procedure Electronic Signature(s) Signed: 03/16/2021 12:13:12 PM By: Deon Pilling RN, BSN Entered By: Deon Pilling on 03/16/2021 10:28:31 -------------------------------------------------------------------------------- Encounter Discharge Information Details Patient Name: Date of Service: Megan Session D. 03/16/2021 9:45 A M Medical Record Number: [id number] Patient Account Number: [id number] Date of Birth/Sex: Treating RN: Apr 18, 1971 (50 y.o. [name] Primary Care Yolanda Dockendorf: [name] Other Clinician: Referring Maxfield Gildersleeve: Treating Ezequiel Macauley/Extender: Sammuel Bailiff in Treatment: 21 Encounter Discharge Information Items Discharge Condition: Stable Ambulatory Status: Ambulatory Discharge Destination: Home Transportation: Private Auto Accompanied By: self Schedule Follow-up Appointment: Yes Clinical Summary of Care: Electronic Signature(s) Signed: 03/16/2021 12:13:12 PM By: Deon Pilling RN, BSN Entered By: Deon Pilling on 03/16/2021 10:30:27 -------------------------------------------------------------------------------- Lower Extremity Assessment Details Patient Name: Date of Service: Megan Session D. 03/16/2021 9:45 A M Medical Record Number: [id number] Patient Account Number: [id number] Date of Birth/Sex: Treating RN: 1970-12-25 (50 y.o. [name] Primary Care Jarrette Dehner: [name] Other Clinician: Referring Raphel Stickles: Treating Bear Osten/Extender: Sammuel Bailiff in Treatment: 21 Edema Assessment Assessed: [name]: Yes] [name]: No] Edema: [Left: Ye] [Right: s] Calf Left: Right: Point of Measurement: 25 cm From Medial Instep 38 cm Ankle Left: Right: Point of  Measurement: 9 cm From Medial Instep 24 cm Vascular Assessment Pulses: Dorsalis Pedis Palpable: [Left:Yes] Electronic Signature(s) Signed: 03/16/2021 12:13:12 PM By: Deon Pilling RN, BSN Entered By: Deon Pilling on 03/16/2021 94:32:76 -------------------------------------------------------------------------------- Multi Wound Chart Details Patient Name: Date of Service: [name], [name] 03/16/2021 9:45 A M Medical Record Number: [id number] Patient Account Number: [id number] Date of Birth/Sex: Treating RN: [birth year] (50 y.o. [name] Primary Care Josanna Hefel: [name] Other Clinician: Referring Toluwani Ruder: Treating Worley Radermacher/Extender: Sammuel Bailiff in Treatment: 21 Vital Signs Height(in): 67 Capillary Blood Glucose(mg/dl): 130 Weight(lbs):  346 Pulse(bpm): 101 Body Mass Index(BMI): 54 Blood Pressure(mmHg): 130/80 Temperature(F): 98.3 Respiratory Rate(breaths/min): 20 Photos: [N/A:N/A] Left, Medial Lower Leg N/A N/A Wound Location: Gradually Appeared N/A N/A Wounding Event: Venous Leg Ulcer N/A N/A Primary Etiology: Sleep Apnea, Congestive Heart N/A N/A Comorbid History: Failure, Coronary Artery Disease, Hypertension 09/26/2020 N/A N/A Date Acquired: 21 N/A N/A Weeks of Treatment: Open N/A N/A Wound Status: 1.3x0.8x0.1 N/A N/A Measurements L x W x D (cm) 0.817 N/A N/A A (cm) : rea 0.082 N/A N/A Volume (cm) : 95.00% N/A N/A % Reduction in Area: 95.00% N/A N/A % Reduction in Volume: Full Thickness Without Exposed N/A N/A Classification: Support Structures Medium N/A N/A Exudate Amount: Serosanguineous N/A N/A Exudate Type: red, brown N/A N/A Exudate Color: Distinct, outline attached N/A N/A Wound Margin: Large (67-100%) N/A N/A Granulation Amount: Red, Pink N/A N/A Granulation Quality: None Present (0%) N/A N/A Necrotic Amount: Fat Layer (Subcutaneous Tissue): Yes N/A N/A Exposed  Structures: Fascia: No Tendon: No Muscle: No Joint: No Bone: No Large (67-100%) N/A N/A Epithelialization: Compression Therapy N/A N/A Procedures Performed: Treatment Notes Wound #4 (Lower Leg) Wound Laterality: Left, Medial Cleanser Soap and Water Discharge Instruction: May shower and wash wound with dial antibacterial soap and water prior to dressing change. Peri-Wound Care Triamcinolone 15 (g) Discharge Instruction: Use triamcinolone 15 (g) as directed Sween Lotion (Moisturizing lotion) Discharge Instruction: Apply moisturizing lotion as directed Topical Primary Dressing Promogran Prisma Matrix, 4.34 (sq in) (silver collagen) Discharge Instruction: Moisten collagen with saline or hydrogel Secondary Dressing Woven Gauze Sponge, Non-Sterile 4x4 in Discharge Instruction: Apply over primary dressing as directed. Secured With Compression Wrap ThreePress (3 layer compression wrap) Discharge Instruction: Apply three layer compression as directed. Unnaboot w/Calamine, 4x10 (in/yd) Discharge Instruction: Apply at top to anchor wrap Compression Stockings Add-Ons Electronic Signature(s) Signed: 03/16/2021 10:58:03 AM By: [name] DO Signed: 03/19/2021 4:18:59 PM By: Baruch Gouty RN, BSN Entered By: [name] on 03/16/2021 10:52:38 -------------------------------------------------------------------------------- [name] Details Patient Name: Date of Service: Megan Session D. 03/16/2021 9:45 A M Medical Record Number: [id number] Patient Account Number: [id number] Date of Birth/Sex: Treating RN: 17-Dec-1970 (50 y.o. [name], Tammi Klippel Primary Care Jeannifer Drakeford: [name] Other Clinician: Referring Tarena Gockley: Treating Kaleah Hagemeister/Extender: Sammuel Bailiff in Treatment: 21 Multidisciplinary Care Plan reviewed with physician Active Inactive Venous Leg Ulcer Nursing Diagnoses: Actual venous Insuffiency (use after  diagnosis is confirmed) Knowledge deficit related to disease process and management Potential for venous Insuffiency (use before diagnosis confirmed) Goals: Patient will maintain optimal edema control Date Initiated: 10/20/2020 Target Resolution Date: 04/27/2021 Goal Status: Active Interventions: Assess peripheral edema status every visit. Compression as ordered Treatment Activities: Therapeutic compression applied : 10/20/2020 Notes: 12/28/20: Edema control ongoing. Wound/Skin Impairment Nursing Diagnoses: Impaired tissue integrity Knowledge deficit related to ulceration/compromised skin integrity Goals: Patient/caregiver will verbalize understanding of skin care regimen Date Initiated: 10/20/2020 Target Resolution Date: 04/27/2021 Goal Status: Active Ulcer/skin breakdown will have a volume reduction of 30% by week 4 Date Initiated: 10/20/2020 Date Inactivated: 12/14/2020 Target Resolution Date: 11/24/2020 Goal Status: Met Ulcer/skin breakdown will have a volume reduction of 80% by week 12 Date Initiated: 12/14/2020 Date Inactivated: 01/23/2021 Target Resolution Date: 01/18/2021 Goal Status: Met Ulcer/skin breakdown will heal within 14 weeks Date Initiated: 01/23/2021 Date Inactivated: 02/23/2021 Target Resolution Date: 02/23/2021 Goal Status: Unmet Unmet Reason: infection Interventions: Assess patient/caregiver ability to obtain necessary supplies Assess patient/caregiver ability to perform ulcer/skin care regimen upon admission and as needed Assess ulceration(s) every visit Provide  education on ulcer and skin care Treatment Activities: Skin care regimen initiated : 10/20/2020 Topical wound management initiated : 10/20/2020 Notes: Electronic Signature(s) Signed: 03/16/2021 12:13:12 PM By: Deon Pilling RN, BSN Entered By: Deon Pilling on 03/16/2021 10:07:36 -------------------------------------------------------------------------------- Pain Assessment Details Patient  Name: Date of Service: [name], [name] 03/16/2021 9:45 A M Medical Record Number: [id number] Patient Account Number: [id number] Date of Birth/Sex: Treating RN: 12-Dec-1970 (50 y.o. [name] Primary Care Lynsi Dooner: [name] Other Clinician: Referring Jodye Scali: Treating Darla Mcdonald/Extender: Sammuel Bailiff in Treatment: 21 Active Problems Location of Pain Severity and Description of Pain Patient Has Paino No Site Locations Rate the pain. Current Pain Level: 0 Pain Management and Medication Current Pain Management: Medication: No Cold Application: No Rest: No Massage: No Activity: No T.E.N.S.: No Heat Application: No Leg drop or elevation: No Is the Current Pain Management Adequate: Adequate How does your wound impact your activities of daily livingo Sleep: No Bathing: No Appetite: No Relationship With Others: No Bladder Continence: No Emotions: No Bowel Continence: No Work: No Toileting: No Drive: No Dressing: No Hobbies: No Engineer, maintenance) Signed: 03/16/2021 12:13:12 PM By: Deon Pilling RN, BSN Entered By: Deon Pilling on 03/16/2021 10:06:03 -------------------------------------------------------------------------------- Patient/Caregiver Education Details Patient Name: Date of Service: Megan Session D. 11/18/2022andnbsp9:45 A M Medical Record Number: [id number] Patient Account Number: [id number] Date of Birth/Gender: Treating RN: 25-Oct-1970 (50 y.o. [name] Primary Care Physician: [name] Other Clinician: Referring Physician: Treating Physician/Extender: Sammuel Bailiff in Treatment: 21 Education Assessment Education Provided To: Patient Education Topics Provided Wound/Skin Impairment: Handouts: Skin Care Do's and Dont's Methods: Explain/Verbal Responses: Reinforcements needed Electronic Signature(s) Signed: 03/16/2021 12:13:12 PM By: Deon Pilling RN,  BSN Entered By: Deon Pilling on 03/16/2021 10:07:53 -------------------------------------------------------------------------------- Wound Assessment Details Patient Name: Date of Service: Megan Session D. 03/16/2021 9:45 A M Medical Record Number: [id number] Patient Account Number: [id number] Date of Birth/Sex: Treating RN: 1970/12/29 (50 y.o. [name] Primary Care Rachid Parham: [name] Other Clinician: Referring Laquan Ludden: Treating Crewe Heathman/Extender: Sammuel Bailiff in Treatment: 21 Wound Status Wound Number: 4 Primary Venous Leg Ulcer Etiology: Wound Location: Left, Medial Lower Leg Wound Open Wounding Event: Gradually Appeared Status: Date Acquired: 09/26/2020 Comorbid Sleep Apnea, Congestive Heart Failure, Coronary Artery Weeks Of Treatment: 21 History: Disease, Hypertension Clustered Wound: No Photos Wound Measurements Length: (cm) 1.3 Width: (cm) 0.8 Depth: (cm) 0.1 Area: (cm) 0.817 Volume: (cm) 0.082 % Reduction in Area: 95% % Reduction in Volume: 95% Epithelialization: Large (67-100%) Tunneling: No Undermining: No Wound Description Classification: Full Thickness Without Exposed Support Structures Wound Margin: Distinct, outline attached Exudate Amount: Medium Exudate Type: Serosanguineous Exudate Color: red, brown Foul Odor After Cleansing: No Slough/Fibrino No Wound Bed Granulation Amount: Large (67-100%) Exposed Structure Granulation Quality: Red, Pink Fascia Exposed: No Necrotic Amount: None Present (0%) Fat Layer (Subcutaneous Tissue) Exposed: Yes Tendon Exposed: No Muscle Exposed: No Joint Exposed: No Bone Exposed: No Treatment Notes Wound #4 (Lower Leg) Wound Laterality: Left, Medial Cleanser Soap and Water Discharge Instruction: May shower and wash wound with dial antibacterial soap and water prior to dressing change. Peri-Wound Care Triamcinolone 15 (g) Discharge Instruction: Use triamcinolone  15 (g) as directed Sween Lotion (Moisturizing lotion) Discharge Instruction: Apply moisturizing lotion as directed Topical Primary Dressing Promogran Prisma Matrix, 4.34 (sq in) (silver collagen) Discharge Instruction: Moisten collagen with saline or hydrogel Secondary Dressing Woven Gauze Sponge, Non-Sterile 4x4 in Discharge Instruction: Apply over primary dressing as  directed. Secured With Compression Wrap ThreePress (3 layer compression wrap) Discharge Instruction: Apply three layer compression as directed. Unnaboot w/Calamine, 4x10 (in/yd) Discharge Instruction: Apply at top to anchor wrap Compression Stockings Add-Ons Electronic Signature(s) Signed: 03/16/2021 12:13:12 PM By: Deon Pilling RN, BSN Signed: 03/19/2021 4:18:59 PM By: Baruch Gouty RN, BSN Entered By: Deon Pilling on 03/16/2021 10:06:43 -------------------------------------------------------------------------------- Vitals Details Patient Name: Date of Service: [name], [name] 03/16/2021 9:45 A M Medical Record Number: [id number] Patient Account Number: [id number] Date of Birth/Sex: Treating RN: Apr 29, 1971 (50 y.o. [name], [name] Primary Care Tanza Pellot: [name] Other Clinician: Referring Milyn Stapleton: Treating Santez Woodcox/Extender: Sammuel Bailiff in Treatment: 21 Vital Signs Time Taken: 10:00 Temperature (F): 98.3 Height (in): 67 Pulse (bpm): 101 Weight (lbs): 346 Respiratory Rate (breaths/min): 20 Body Mass Index (BMI): 54.2 Blood Pressure (mmHg): 130/80 Capillary Blood Glucose (mg/dl): 130 Reference Range: 80 - 120 mg / dl Electronic Signature(s) Signed: 03/16/2021 12:13:12 PM By: Deon Pilling RN, BSN Entered By: Deon Pilling on 03/16/2021 10:06:08

## 2021-03-20 ENCOUNTER — Other Ambulatory Visit: Payer: Self-pay

## 2021-03-20 ENCOUNTER — Encounter (HOSPITAL_BASED_OUTPATIENT_CLINIC_OR_DEPARTMENT_OTHER): Payer: Medicaid Other | Admitting: Internal Medicine

## 2021-03-20 DIAGNOSIS — I87312 Chronic venous hypertension (idiopathic) with ulcer of left lower extremity: Secondary | ICD-10-CM | POA: Diagnosis not present

## 2021-03-21 NOTE — Progress Notes (Signed)
[name] ([id number]) Visit Report for 03/20/2021 Arrival Information Details Patient Name: Date of Service: [name], [name] 03/20/2021 1:30 PM Medical Record Number: [id number] Patient Account Number: [id number] Date of Birth/Sex: Treating RN: 1971/04/03 (50 y.o. [name], [name] Primary Care Juvia Aerts: [name] Other Clinician: Referring Okechukwu Regnier: Treating Rodneisha Bonnet/Extender: Sammuel Bailiff in Treatment: 21 Visit Information History Since Last Visit Added or deleted any medications: No Patient Arrived: Ambulatory Any new allergies or adverse reactions: No Arrival Time: 14:22 Had a fall or experienced change in No Accompanied By: self activities of daily living that may affect Transfer Assistance: None risk of falls: Patient Identification Verified: Yes Signs or symptoms of abuse/neglect since last visito No Secondary Verification Process Completed: Yes Hospitalized since last visit: No Patient Requires Transmission-Based Precautions: No Implantable device outside of the clinic excluding No Patient Has Alerts: Yes cellular tissue based products placed in the center Patient Alerts: ABI's: 11/21 L:1.17 since last visit: Has Dressing in Place as Prescribed: Yes Has Compression in Place as Prescribed: Yes Pain Present Now: No Electronic Signature(s) Signed: 03/21/2021 3:00:15 PM By: Rhae Hammock RN Entered By: Rhae Hammock on 03/20/2021 14:22:27 -------------------------------------------------------------------------------- Compression Therapy Details Patient Name: Date of Service: [name], [name] 03/20/2021 1:30 PM Medical Record Number: [id number] Patient Account Number: [id number] Date of Birth/Sex: Treating RN: 1971-02-16 (50 y.o. [name], [name] Primary Care Daison Braxton: [name] Other Clinician: Referring Sameena Artus: Treating Rupa Lagan/Extender: Sammuel Bailiff in  Treatment: 21 Compression Therapy Performed for Wound Assessment: Wound #4 Left,Medial Lower Leg Performed By: Clinician Rhae Hammock, RN Compression Type: Three Hydrologist) Signed: 03/21/2021 3:00:15 PM By: Rhae Hammock RN Entered By: Rhae Hammock on 03/20/2021 14:23:30 -------------------------------------------------------------------------------- Encounter Discharge Information Details Patient Name: Date of Service: Megan Session D. 03/20/2021 1:30 PM Medical Record Number: [id number] Patient Account Number: [id number] Date of Birth/Sex: Treating RN: 1970/08/12 (50 y.o. [name], [name] Primary Care Pearley Millington: [name] Other Clinician: Referring Tramell Piechota: Treating Sydnie Sigmund/Extender: Sammuel Bailiff in Treatment: 21 Encounter Discharge Information Items Discharge Condition: Stable Ambulatory Status: Ambulatory Discharge Destination: Home Transportation: Private Auto Accompanied By: self Schedule Follow-up Appointment: Yes Clinical Summary of Care: Patient Declined Electronic Signature(s) Signed: 03/21/2021 3:00:15 PM By: Rhae Hammock RN Entered By: Rhae Hammock on 03/20/2021 14:24:14 -------------------------------------------------------------------------------- Patient/Caregiver Education Details Patient Name: Date of Service: Megan Session D. 11/22/2022andnbsp1:30 PM Medical Record Number: [id number] Patient Account Number: [id number] Date of Birth/Gender: Treating RN: 08/13/1970 (50 y.o. [name], [name] Primary Care Physician: [name] Other Clinician: Referring Physician: Treating Physician/Extender: Sammuel Bailiff in Treatment: 21 Education Assessment Education Provided To: Patient Education Topics Provided Wound/Skin Impairment: Methods: Explain/Verbal Responses: State content correctly [employer]) Signed: 03/21/2021  3:00:15 PM By: Rhae Hammock RN Entered By: Rhae Hammock on 03/20/2021 14:23:59 -------------------------------------------------------------------------------- Wound Assessment Details Patient Name: Date of Service: [name], [name] 03/20/2021 1:30 PM Medical Record Number: [id number] Patient Account Number: [id number] Date of Birth/Sex: Treating RN: 1971/03/10 (50 y.o. [name], [name] Primary Care Lura Falor: [name] Other Clinician: Referring Zain Lankford: Treating Elfreida Heggs/Extender: Sammuel Bailiff in Treatment: 21 Wound Status Wound Number: 4 Primary Etiology: Venous Leg Ulcer Wound Location: Left, Medial Lower Leg Wound Status: Open Wounding Event: Gradually Appeared Date Acquired: 09/26/2020 Weeks Of Treatment: 21 Clustered Wound: No Wound Measurements Length: (cm) 1.3 Width: (cm) 0.8 Depth: (cm) 0.1 Area: (cm) 0.817 Volume: (cm) 0.082 % Reduction in Area: 95% % Reduction in  Volume: 95% Wound Description Classification: Full Thickness Without Exposed Support Structu Exudate Amount: Medium Exudate Type: Serosanguineous Exudate Color: red, brown res Treatment Notes Wound #4 (Lower Leg) Wound Laterality: Left, Medial Cleanser Soap and Water Discharge Instruction: May shower and wash wound with dial antibacterial soap and water prior to dressing change. Peri-Wound Care Triamcinolone 15 (g) Discharge Instruction: Use triamcinolone 15 (g) as directed Sween Lotion (Moisturizing lotion) Discharge Instruction: Apply moisturizing lotion as directed Topical Primary Dressing Promogran Prisma Matrix, 4.34 (sq in) (silver collagen) Discharge Instruction: Moisten collagen with saline or hydrogel Secondary Dressing Woven Gauze Sponge, Non-Sterile 4x4 in Discharge Instruction: Apply over primary dressing as directed. Secured With Compression Wrap ThreePress (3 layer compression wrap) Discharge Instruction: Apply three layer  compression as directed. Unnaboot w/Calamine, 4x10 (in/yd) Discharge Instruction: Apply at top to anchor wrap Compression Stockings Add-Ons Electronic Signature(s) Signed: 03/21/2021 3:00:15 PM By: Rhae Hammock RN Entered By: Rhae Hammock on 03/20/2021 14:23:08 -------------------------------------------------------------------------------- Vitals Details Patient Name: Date of Service: [name], [name] 03/20/2021 1:30 PM Medical Record Number: [id number] Patient Account Number: [id number] Date of Birth/Sex: Treating RN: [birth year] (50 y.o. [name], [name] Primary Care Searcy Miyoshi: [name] Other Clinician: Referring Estha Few: Treating Charmika Macdonnell/Extender: Sammuel Bailiff in Treatment: 21 Vital Signs Time Taken: 14:22 Temperature (F): 98.78 Height (in): 67 Pulse (bpm): 74 Weight (lbs): 346 Respiratory Rate (breaths/min): 17 Body Mass Index (BMI): 54.2 Blood Pressure (mmHg): 117/74 Capillary Blood Glucose (mg/dl): 130 Reference Range: 80 - 120 mg / dl Electronic Signature(s) Signed: 03/21/2021 3:00:15 PM By: Rhae Hammock RN Entered By: Rhae Hammock on 03/20/2021 14:22:47

## 2021-03-21 NOTE — Progress Notes (Signed)
SHANTERRIA, FRANTA ([id number]) Visit Report for 03/20/2021 SuperBill Details Patient Name: Date of Service: CIEARA, STIERWALT 03/20/2021 Medical Record Number: [id number] Patient Account Number: [id number] Date of Birth/Sex: Treating RN: [birth year] (50 y.o. [name], [name] Primary Care Provider: [name] Other Clinician: Referring Provider: Treating Provider/Extender: Sammuel Bailiff in Treatment: 21 Diagnosis Coding ICD-10 Codes Code Description [id number] Non-pressure chronic ulcer of other part of left lower leg with unspecified severity I87.312 Chronic venous hypertension (idiopathic) with ulcer of left lower extremity I89.0 Lymphedema, not elsewhere classified Z86.718 Personal history of other venous thrombosis and embolism Facility Procedures CPT4 Code Description Modifier Quantity [id number] (Facility Use Only) [phone] - [place] 1 Electronic Signature(s) Signed: 03/20/2021 3:02:03 PM By: [name] DO Signed: 03/21/2021 3:00:15 PM By: Rhae Hammock RN Entered By: Rhae Hammock on 03/20/2021 14:24:25

## 2021-03-30 ENCOUNTER — Encounter (HOSPITAL_BASED_OUTPATIENT_CLINIC_OR_DEPARTMENT_OTHER): Payer: Medicaid Other | Attending: Internal Medicine | Admitting: Internal Medicine

## 2021-03-30 ENCOUNTER — Other Ambulatory Visit: Payer: Self-pay

## 2021-03-30 DIAGNOSIS — I87312 Chronic venous hypertension (idiopathic) with ulcer of left lower extremity: Secondary | ICD-10-CM | POA: Insufficient documentation

## 2021-03-30 DIAGNOSIS — I5032 Chronic diastolic (congestive) heart failure: Secondary | ICD-10-CM | POA: Insufficient documentation

## 2021-03-30 DIAGNOSIS — Z86718 Personal history of other venous thrombosis and embolism: Secondary | ICD-10-CM | POA: Diagnosis not present

## 2021-03-30 DIAGNOSIS — I890 Lymphedema, not elsewhere classified: Secondary | ICD-10-CM | POA: Insufficient documentation

## 2021-03-30 DIAGNOSIS — L97829 Non-pressure chronic ulcer of other part of left lower leg with unspecified severity: Secondary | ICD-10-CM | POA: Insufficient documentation

## 2021-03-30 DIAGNOSIS — I110 Hypertensive heart disease with heart failure: Secondary | ICD-10-CM | POA: Diagnosis not present

## 2021-04-03 ENCOUNTER — Other Ambulatory Visit: Payer: Self-pay | Admitting: Internal Medicine

## 2021-04-03 DIAGNOSIS — F411 Generalized anxiety disorder: Secondary | ICD-10-CM

## 2021-04-03 NOTE — Telephone Encounter (Signed)
  Requested medication (s) are due for refill today: yes  Requested medication (s) are on the active medication list: yes  Last refill:  03/06/21  Future visit scheduled: no  Notes to clinic:  Was seen in Nov, do not see anxiety addressed, asked to return in March, no visit scheduled at this time. Please assess.    Requested Prescriptions  Pending Prescriptions Disp Refills   PARoxetine (PAXIL) 10 MG tablet [Pharmacy Med Name: PARoxetine HCL 10 MG TABLET] 30 tablet 0    Sig: TAKE ONE TABLET BY MOUTH DAILY     Psychiatry:  Antidepressants - SSRI Passed - 04/03/2021  6:22 AM      Passed - Valid encounter within last 6 months    Recent Outpatient Visits           1 month ago Iron deficiency anemia due to chronic blood loss   Josephine Ladell [place], MD   3 months ago Preoperative evaluation to rule out surgical contraindication   [place] [name] B, MD   4 months ago Type 2 diabetes mellitus with hyperglycemia, unspecified whether long term insulin use [facility])   [place], [place], [place]   9 months ago Chronic midline thoracic back pain   [place], Deborah B, MD   12 months ago Establishing care with new doctor, encounter for   [place], MD       Future Appointments             In 3 months Ellyn Hack Leonie Green, MD [place], [facility]

## 2021-04-03 NOTE — Progress Notes (Signed)
[name], [name] ([id number]) Visit Report for 03/30/2021 Arrival Information Details Patient Name: Date of Service: [name], [name] D. 03/30/2021 8:30 A M Medical Record Number: [id number] Patient Account Number: [id number] Date of Birth/Sex: Treating RN: 02-20-1971 (50 y.o. [name], [name] Primary Care Provider: [name] Other Clinician: Referring Provider: Treating Provider/Extender: Sammuel Bailiff in Treatment: 23 Visit Information History Since Last Visit Added or deleted any medications: No Patient Arrived: Ambulatory Any new allergies or adverse reactions: No Arrival Time: 08:37 Had a fall or experienced change in No Accompanied By: self activities of daily living that may affect Transfer Assistance: None risk of falls: Patient Requires Transmission-Based Precautions: No Signs or symptoms of abuse/neglect since last visito No Patient Has Alerts: Yes Hospitalized since last visit: No Patient Alerts: ABI's: 11/21 L:1.17 Implantable device outside of the clinic excluding No cellular tissue based products placed in the center since last visit: Has Dressing in Place as Prescribed: Yes Pain Present Now: No Electronic Signature(s) Signed: 04/03/2021 5:02:41 PM By: Dellie Catholic RN Entered By: Dellie Catholic on 03/30/2021 08:38:18 -------------------------------------------------------------------------------- Compression Therapy Details Patient Name: Date of Service: [name], [name] 03/30/2021 8:30 A M Medical Record Number: [id number] Patient Account Number: [id number] Date of Birth/Sex: Treating RN: 29-Jan-1971 (50 y.o. [name] Primary Care Provider: [name] Other Clinician: Referring Provider: Treating Provider/Extender: Sammuel Bailiff in Treatment: 23 Compression Therapy Performed for Wound Assessment: Wound #4 Left,Medial Lower Leg Performed By: Clinician Deon Pilling,  RN Compression Type: Three Layer Post Procedure Diagnosis Same as Pre-procedure Electronic Signature(s) Signed: 03/30/2021 12:39:40 PM By: Deon Pilling RN, BSN Entered By: Deon Pilling on 03/30/2021 09:24:07 -------------------------------------------------------------------------------- Encounter Discharge Information Details Patient Name: Date of Service: [name] D. 03/30/2021 8:30 A M Medical Record Number: [id number] Patient Account Number: [id number] Date of Birth/Sex: Treating RN: 18-Aug-1970 (50 y.o. [name] Primary Care Provider: [name] Other Clinician: Referring Provider: Treating Provider/Extender: Sammuel Bailiff in Treatment: 23 Encounter Discharge Information Items Discharge Condition: Stable Ambulatory Status: Ambulatory Discharge Destination: Home Transportation: Private Auto Accompanied By: self Schedule Follow-up Appointment: Yes Clinical Summary of Care: Electronic Signature(s) Signed: 03/30/2021 12:39:40 PM By: Deon Pilling RN, BSN Entered By: Deon Pilling on 03/30/2021 09:27:08 -------------------------------------------------------------------------------- Lower Extremity Assessment Details Patient Name: Date of Service: [name] D. 03/30/2021 8:30 A M Medical Record Number: [id number] Patient Account Number: [id number] Date of Birth/Sex: Treating RN: February 04, 1971 (50 y.o. [name] Primary Care Provider: [name] Other Clinician: Referring Provider: Treating Provider/Extender: Sammuel Bailiff in Treatment: 23 Edema Assessment Assessed: [Left: No] [Right: No] Edema: [Left: Ye] [Right: s] Calf Left: Right: Point of Measurement: 25 cm From Medial Instep 39 cm Ankle Left: Right: Point of Measurement: 9 cm From Medial Instep 24 cm Electronic Signature(s) Signed: 04/03/2021 4:29:15 PM By: Baruch Gouty RN, BSN Signed: 04/03/2021 5:02:41 PM By:  Dellie Catholic RN Entered By: Dellie Catholic on 03/30/2021 08:54:44 -------------------------------------------------------------------------------- Multi Wound Chart Details Patient Name: Date of Service: [name], [name] 03/30/2021 8:30 A M Medical Record Number: [id number] Patient Account Number: [id number] Date of Birth/Sex: Treating RN: [birth year] (50 y.o. [name] Primary Care Provider: [name] Other Clinician: Referring Provider: Treating Provider/Extender: Sammuel Bailiff in Treatment: 23 Vital Signs Height(in): 67 Pulse(bpm): 86 Weight(lbs): 346 Blood Pressure(mmHg): 108/77 Body Mass Index(BMI): 54 Temperature(F): 98.3 Respiratory Rate(breaths/min): 18 Photos: [N/A:N/A] Left, Medial Lower Leg N/A N/A Wound Location:  Gradually Appeared N/A N/A Wounding Event: Venous Leg Ulcer N/A N/A Primary Etiology: Sleep Apnea, Congestive Heart N/A N/A Comorbid History: Failure, Coronary Artery Disease, Hypertension 09/26/2020 N/A N/A Date Acquired: 40 N/A N/A Weeks of Treatment: Open N/A N/A Wound Status: 0.1x0.1x0.1 N/A N/A Measurements L x W x D (cm) 0.008 N/A N/A A (cm) : rea 0.001 N/A N/A Volume (cm) : 100.00% N/A N/A % Reduction in Area: 99.90% N/A N/A % Reduction in Volume: Full Thickness Without Exposed N/A N/A Classification: Support Structures Medium N/A N/A Exudate Amount: Serosanguineous N/A N/A Exudate Type: red, brown N/A N/A Exudate Color: None Present (0%) N/A N/A Granulation Amount: None Present (0%) N/A N/A Necrotic Amount: Fat Layer (Subcutaneous Tissue): Yes N/A N/A Exposed Structures: Fascia: No Tendon: No Muscle: No Joint: No Bone: No Large (67-100%) N/A N/A Epithelialization: Compression Therapy N/A N/A Procedures Performed: Treatment Notes Wound #4 (Lower Leg) Wound Laterality: Left, Medial Cleanser Soap and Water Discharge Instruction: May shower and wash wound with  dial antibacterial soap and water prior to dressing change. Peri-Wound Care Triamcinolone 15 (g) Discharge Instruction: Use triamcinolone 15 (g) as directed Sween Lotion (Moisturizing lotion) Discharge Instruction: Apply moisturizing lotion as directed Topical Primary Dressing Secondary Dressing Optifoam Non-Adhesive Dressing, 4x4 in Discharge Instruction: Apply over primary dressing as directed. Secured With Compression Wrap ThreePress (3 layer compression wrap) Discharge Instruction: Apply three layer compression as directed. Unnaboot w/Calamine, 4x10 (in/yd) Discharge Instruction: Apply at top to anchor wrap Compression Stockings Add-Ons Electronic Signature(s) Signed: 03/30/2021 9:50:20 AM By: [name] DO Signed: 04/03/2021 4:29:15 PM By: Baruch Gouty RN, BSN Entered By: [name] on 03/30/2021 09:45:15 -------------------------------------------------------------------------------- [place] Details Patient Name: Date of Service: [name], Averil D. 03/30/2021 8:30 A M Medical Record Number: [id number] Patient Account Number: [id number] Date of Birth/Sex: Treating RN: [birth year] (50 y.o. [name], Tammi Klippel Primary Care Mollyann Halbert: [name] Other Clinician: Referring Aune Adami: Treating Farouk Vivero/Extender: Sammuel Bailiff in Treatment: 23 Multidisciplinary Care Plan reviewed with physician Active Inactive Venous Leg Ulcer Nursing Diagnoses: Actual venous Insuffiency (use after diagnosis is confirmed) Knowledge deficit related to disease process and management Potential for venous Insuffiency (use before diagnosis confirmed) Goals: Patient will maintain optimal edema control Date Initiated: 10/20/2020 Target Resolution Date: 04/27/2021 Goal Status: Active Interventions: Assess peripheral edema status every visit. Compression as ordered Treatment Activities: Therapeutic compression applied :  10/20/2020 Notes: 12/28/20: Edema control ongoing. Wound/Skin Impairment Nursing Diagnoses: Impaired tissue integrity Knowledge deficit related to ulceration/compromised skin integrity Goals: Patient/caregiver will verbalize understanding of skin care regimen Date Initiated: 10/20/2020 Target Resolution Date: 04/27/2021 Goal Status: Active Ulcer/skin breakdown will have a volume reduction of 30% by week 4 Date Initiated: 10/20/2020 Date Inactivated: 12/14/2020 Target Resolution Date: 11/24/2020 Goal Status: Met Ulcer/skin breakdown will have a volume reduction of 80% by week 12 Date Initiated: 12/14/2020 Date Inactivated: 01/23/2021 Target Resolution Date: 01/18/2021 Goal Status: Met Ulcer/skin breakdown will heal within 14 weeks Date Initiated: 01/23/2021 Date Inactivated: 02/23/2021 Target Resolution Date: 02/23/2021 Goal Status: Unmet Unmet Reason: infection Interventions: Assess patient/caregiver ability to obtain necessary supplies Assess patient/caregiver ability to perform ulcer/skin care regimen upon admission and as needed Assess ulceration(s) every visit Provide education on ulcer and skin care Treatment Activities: Skin care regimen initiated : 10/20/2020 Topical wound management initiated : 10/20/2020 Notes: Electronic Signature(s) Signed: 03/30/2021 12:39:40 PM By: Deon Pilling RN, BSN Entered By: Deon Pilling on 03/30/2021 09:24:19 -------------------------------------------------------------------------------- Pain Assessment Details Patient Name: Date of Service: [name], Sanita D. 03/30/2021  8:30 A M Medical Record Number: [id number] Patient Account Number: [id number] Date of Birth/Sex: Treating RN: 1970/11/20 (50 y.o. [name] Primary Care Petros Ahart: [name] Other Clinician: Referring Sufyan Meidinger: Treating Pedrohenrique [place]/Extender: Sammuel Bailiff in Treatment: 23 Active Problems Location of Pain Severity and Description of  Pain Patient Has Paino No Site Locations Pain Management and Medication Current Pain Management: Electronic Signature(s) Signed: 04/03/2021 4:29:15 PM By: Baruch Gouty RN, BSN Signed: 04/03/2021 5:02:41 PM By: Dellie Catholic RN Entered By: Dellie Catholic on 03/30/2021 08:42:08 -------------------------------------------------------------------------------- Patient/Caregiver Education Details Patient Name: Date of Service: [name] D. 12/2/2022andnbsp8:30 A M Medical Record Number: [id number] Patient Account Number: [id number] Date of Birth/Gender: Treating RN: 1970/10/30 (50 y.o. [name] Primary Care Physician: [name] Other Clinician: Referring Physician: Treating Physician/Extender: Sammuel Bailiff in Treatment: 23 Education Assessment Education Provided To: Patient Education Topics Provided Wound/Skin Impairment: Handouts: Skin Care Do's and Dont's Methods: Explain/Verbal Responses: Reinforcements needed Electronic Signature(s) Signed: 03/30/2021 12:39:40 PM By: Deon Pilling RN, BSN Entered By: Deon Pilling on 03/30/2021 09:24:36 -------------------------------------------------------------------------------- Wound Assessment Details Patient Name: Date of Service: [name] D. 03/30/2021 8:30 A M Medical Record Number: [id number] Patient Account Number: [id number] Date of Birth/Sex: Treating RN: 1971-01-06 (50 y.o. [name] Primary Care Reise Gladney: [name] Other Clinician: Referring Shelbie Franken: Treating Carlea Badour/Extender: Sammuel Bailiff in Treatment: 23 Wound Status Wound Number: 4 Primary Venous Leg Ulcer Etiology: Wound Location: Left, Medial Lower Leg Wound Open Wounding Event: Gradually Appeared Status: Date Acquired: 09/26/2020 Comorbid Sleep Apnea, Congestive Heart Failure, Coronary Artery Weeks Of Treatment: 23 History: Disease, Hypertension Clustered  Wound: No Photos Wound Measurements Length: (cm) 0.1 Width: (cm) 0.1 Depth: (cm) 0.1 Area: (cm) 0.008 Volume: (cm) 0.001 % Reduction in Area: 100% % Reduction in Volume: 99.9% Epithelialization: Large (67-100%) Tunneling: No Undermining: No Wound Description Classification: Full Thickness Without Exposed Support Structures Exudate Amount: Medium Exudate Type: Serosanguineous Exudate Color: red, brown Foul Odor After Cleansing: No Slough/Fibrino No Wound Bed Granulation Amount: None Present (0%) Exposed Structure Necrotic Amount: None Present (0%) Fascia Exposed: No Fat Layer (Subcutaneous Tissue) Exposed: Yes Tendon Exposed: No Muscle Exposed: No Joint Exposed: No Bone Exposed: No Treatment Notes Wound #4 (Lower Leg) Wound Laterality: Left, Medial Cleanser Soap and Water Discharge Instruction: May shower and wash wound with dial antibacterial soap and water prior to dressing change. Peri-Wound Care Triamcinolone 15 (g) Discharge Instruction: Use triamcinolone 15 (g) as directed Sween Lotion (Moisturizing lotion) Discharge Instruction: Apply moisturizing lotion as directed Topical Primary Dressing Secondary Dressing Optifoam Non-Adhesive Dressing, 4x4 in Discharge Instruction: Apply over primary dressing as directed. Secured With Compression Wrap ThreePress (3 layer compression wrap) Discharge Instruction: Apply three layer compression as directed. Unnaboot w/Calamine, 4x10 (in/yd) Discharge Instruction: Apply at top to anchor wrap Compression Stockings Add-Ons Electronic Signature(s) Signed: 04/03/2021 4:29:15 PM By: Baruch Gouty RN, BSN Signed: 04/03/2021 5:02:41 PM By: Dellie Catholic RN Entered By: Dellie Catholic on 03/30/2021 08:59:22 -------------------------------------------------------------------------------- [place] Details Patient Name: Date of Service: [name], Emilea D. 03/30/2021 8:30 A M Medical Record Number: [id number] Patient Account  Number: [id number] Date of Birth/Sex: Treating RN: December 26, 1970 (50 y.o. [name] Primary Care Azariya Freeman: [name] Other Clinician: Referring Marigene Erler: Treating Gerianne Simonet/Extender: Sammuel Bailiff in Treatment: 23 Vital Signs Time Taken: 08:38 Temperature (F): 98.3 Height (in): 67 Pulse (bpm): 86 Weight (lbs): 346 Respiratory Rate (breaths/min): 18 Body Mass Index (BMI): 54.2 Blood Pressure (mmHg): 108/77  Reference Range: 80 - 120 mg / dl Electronic Signature(s) Signed: 04/03/2021 5:02:41 PM By: Dellie Catholic RN Entered By: Dellie Catholic on 03/30/2021 08:41:11

## 2021-04-03 NOTE — Progress Notes (Signed)
[name], [name] ([id number]) Visit Report for 03/30/2021 Chief Complaint Document Details Patient Name: Date of Service: [name], [name] 03/30/2021 8:30 A M Medical Record Number: [id number] Patient Account Number: [id number] Date of Birth/Sex: Treating RN: [birth year] (50 y.o. [name] Primary Care Provider: [name] Other Clinician: Referring Provider: Treating Provider/Extender: Sammuel Bailiff in Treatment: 23 Information Obtained from: Patient Chief Complaint Left lower extremity wound Electronic Signature(s) Signed: 03/30/2021 9:50:20 AM By: [name] DO Entered By: [name] on 03/30/2021 09:45:27 -------------------------------------------------------------------------------- HPI Details Patient Name: Date of Service: [name], Caralina D. 03/30/2021 8:30 A M Medical Record Number: [id number] Patient Account Number: [id number] Date of Birth/Sex: Treating RN: Nov 03, 1970 (50 y.o. [name] Primary Care Provider: [name] Other Clinician: Referring Provider: Treating Provider/Extender: Sammuel Bailiff in Treatment: 23 History of Present Illness HPI Description: Admission 6/24 [name] is a 50 year old female with a past medical history of chronic venous insufficiency, lymphedema, DVT on anticoagulation and diastolic heart failure that presents to the clinic for left lower extremity wound. She was last seen 4 months ago in our clinic for the same issue. The reoccurring wound started at the end of May and has been going on for 1 month. She has been using an ointment and I am unclear what this is. She tries to keep her leg elevated with her compression stocking. She also reports she has lymphedema pumps and has been using them as well. She reports mild pain to the area. She denies signs of infection including increased warmth, erythema or purulent drainage. 7/1; patient  presents for 1 week follow-up. She has tolerated the wrap well. Unfortunately she did have trouble with the wrap Sliding down her leg 2 days ago. She has no issues or complaints today. She denies signs of infection. 7/15; patient presents for follow-up. She has tolerated the wrap well. She has no issues or complaints today. She denies signs of infection. 7/25; patient presents for 1 week follow-up. She has no issues or complaints today. She denies signs of infection. 8/4; patient presents for follow-up. She has tolerated the compression wrap well to her left lower extremity. She states she was in the hospital last week due to fluid overload. She subsequently developed blisters to her bottom from being in the hospital bed for prolonged periods of time. These have since ruptured. She now has 2 areas of skin breakdown. She has been keeping gauze on them. She denies signs of infection. 8/18; patient presents for 2-week follow-up. She reports improvement to her buttocks wounds. She is able to tolerate the wrap well to her left lower extremity with no issues. She denies signs of infection. 9/1; patient presents for 2-week follow-up. She has no issues or complaints today. she denies signs of infection. 9/15; patient presents for 2-week follow-up. She has no issues or complaints today. She has tolerated the wrap well. She denies signs of infection. 9/27; patient presents for 1 week follow-up. She has no issues or complaints today. 10/20; patient presents for follow-up. She was on vacation for the past 3 weeks. She states she had the last clinic visit wrap in place for 1 week and then she took this off. She states that she has been using her compression wraps since. She states that the wound has gotten larger. She reports pain to the wound site. She denies fever/chills, nausea/vomiting. She denies purulent drainage. 10/24; patient presents for follow-up. She states she tolerated the Kerlix/Coban  wrap well. She  reports decrease in pain. She states she started Keflex. She denies systemic signs of infection. 10/28; patient presents for follow-up. She has no issues or complaints today. She denies acute pain. She tolerated the wrap well. 11/4; patient presents for follow-up. She has no issues or complaints. She denies signs of infection. 11/11; patient presents for follow-up. She has no issues or complaints today. She denies signs of infection. She tolerated the wrap well. 11/18; patient presents for follow-up. She states she took a shower however despite a plastic bag the wrap got wet. She currently denies signs of infection. 12/2; patient presents for follow-up. She tolerated the wrap well. She has no issues or complaints today. Electronic Signature(s) Signed: 03/30/2021 9:50:20 AM By: [name] DO Entered By: [name] on 03/30/2021 09:45:58 -------------------------------------------------------------------------------- Physical Exam Details Patient Name: Date of Service: Megan Session D. 03/30/2021 8:30 A M Medical Record Number: [id number] Patient Account Number: [id number] Date of Birth/Sex: Treating RN: 05-Mar-1971 (50 y.o. [name] Primary Care Provider: [name] Other Clinician: Referring Provider: Treating Provider/Extender: Sammuel Bailiff in Treatment: 23 Constitutional respirations regular, non-labored and within target range for patient.. Cardiovascular 2+ dorsalis pedis/posterior tibialis pulses. Psychiatric pleasant and cooperative. Notes Left lower extremity: On the medial aspect there is a pinpoint open wound with granulation tissue. No signs of infection. Good edema control. Electronic Signature(s) Signed: 03/30/2021 9:50:20 AM By: [name] DO Entered By: [name] on 03/30/2021 09:48:08 -------------------------------------------------------------------------------- Physician Orders Details Patient Name:  Date of Service: Megan Session D. 03/30/2021 8:30 A M Medical Record Number: [id number] Patient Account Number: [id number] Date of Birth/Sex: Treating RN: 1970-11-17 (50 y.o. [name], [name] Primary Care Provider: [name] Other Clinician: Referring Provider: Treating Provider/Extender: Sammuel Bailiff in Treatment: 23 Verbal / Phone Orders: No Diagnosis Coding ICD-10 Coding Code Description [id number] Non-pressure chronic ulcer of other part of left lower leg with unspecified severity I87.312 Chronic venous hypertension (idiopathic) with ulcer of left lower extremity I89.0 Lymphedema, not elsewhere classified Z86.718 Personal history of other venous thrombosis and embolism Follow-up Appointments ppointment in 1 week. - Dr. Heber  Return A Bathing/ Shower/ Hygiene May shower with protection but do not get wound dressing(s) wet. - use cast protector to keep wrap dry in the shower Edema Control - Lymphedema / SCD / Other Bilateral Lower Extremities Lymphedema Pumps. Use Lymphedema pumps on leg(s) 2-3 times a day for 45-60 minutes. If wearing any wraps or hose, do not remove them. Continue exercising as instructed. Elevate legs to the level of the heart or above for 30 minutes daily and/or when sitting, a frequency of: Avoid standing for long periods of time. Patient to wear own compression stockings every day. - right leg daily Exercise regularly Off-Loading Turn and reposition every 2 hours - Do not apply pressure to these areas. Wound Treatment Wound #4 - Lower Leg Wound Laterality: Left, Medial Cleanser: Soap and Water 1 x Per Week/15 Days Discharge Instructions: May shower and wash wound with dial antibacterial soap and water prior to dressing change. Peri-Wound Care: Triamcinolone 15 (g) 1 x Per Week/15 Days Discharge Instructions: Use triamcinolone 15 (g) as directed Peri-Wound Care: Sween Lotion (Moisturizing lotion) 1 x Per Week/15  Days Discharge Instructions: Apply moisturizing lotion as directed Secondary Dressing: Optifoam Non-Adhesive Dressing, 4x4 in 1 x Per Week/15 Days Discharge Instructions: Apply over primary dressing as directed. Compression Wrap: ThreePress (3 layer compression wrap) 1 x Per Week/15 Days Discharge  Instructions: Apply three layer compression as directed. Compression Wrap: Unnaboot w/Calamine, 4x10 (in/yd) 1 x Per Week/15 Days Discharge Instructions: Apply at top to anchor wrap Electronic Signature(s) Signed: 03/30/2021 9:50:20 AM By: [name] DO Entered By: [name] on 03/30/2021 09:48:25 -------------------------------------------------------------------------------- Problem List Details Patient Name: Date of Service: [name], Marillyn D. 03/30/2021 8:30 A M Medical Record Number: [id number] Patient Account Number: [id number] Date of Birth/Sex: Treating RN: 11-Jul-1970 (50 y.o. [name], [name] Primary Care Provider: [name] Other Clinician: Referring Provider: Treating Provider/Extender: Sammuel Bailiff in Treatment: 23 Active Problems ICD-10 Encounter Code Description Active Date MDM Diagnosis L97.829 Non-pressure chronic ulcer of other part of left lower leg with unspecified 10/20/2020 No Yes severity I87.312 Chronic venous hypertension (idiopathic) with ulcer of left lower extremity 03/09/2021 No Yes I89.0 Lymphedema, not elsewhere classified 10/20/2020 No Yes Z86.718 Personal history of other venous thrombosis and embolism 10/20/2020 No Yes Inactive Problems Resolved Problems Electronic Signature(s) Signed: 03/30/2021 9:50:20 AM By: [name] DO Entered By: [name] on 03/30/2021 09:45:09 -------------------------------------------------------------------------------- Progress Note Details Patient Name: Date of Service: [name], Kashara D. 03/30/2021 8:30 A M Medical Record Number: [id number] Patient Account  Number: [id number] Date of Birth/Sex: Treating RN: 04-29-1971 (50 y.o. [name] Primary Care Provider: [name] Other Clinician: Referring Provider: Treating Provider/Extender: Sammuel Bailiff in Treatment: 23 Subjective Chief Complaint Information obtained from Patient Left lower extremity wound History of Present Illness (HPI) Admission 6/24 [name] with a past medical history of chronic venous insufficiency, lymphedema, DVT on anticoagulation and diastolic heart failure that presents to the clinic for left lower extremity wound. She was last seen 4 months ago in our clinic for the same issue. The reoccurring wound started at the end of May and has been going on for 1 month. She has been using an ointment and I am unclear what this is. She tries to keep her leg elevated with her compression stocking. She also reports she has lymphedema pumps and has been using them as well. She reports mild pain to the area. She denies signs of infection including increased warmth, erythema or purulent drainage. 7/1; patient presents for 1 week follow-up. She has tolerated the wrap well. Unfortunately she did have trouble with the wrap Sliding down her leg 2 days ago. She has no issues or complaints today. She denies signs of infection. 7/15; patient presents for follow-up. She has tolerated the wrap well. She has no issues or complaints today. She denies signs of infection. 7/25; patient presents for 1 week follow-up. She has no issues or complaints today. She denies signs of infection. 8/4; patient presents for follow-up. She has tolerated the compression wrap well to her left lower extremity. She states she was in the hospital last week due to fluid overload. She subsequently developed blisters to her bottom from being in the hospital bed for prolonged periods of time. These have since ruptured. She now has 2 areas of skin  breakdown. She has been keeping gauze on them. She denies signs of infection. 8/18; patient presents for 2-week follow-up. She reports improvement to her buttocks wounds. She is able to tolerate the wrap well to her left lower extremity with no issues. She denies signs of infection. 9/1; patient presents for 2-week follow-up. She has no issues or complaints today. she denies signs of infection. 9/15; patient presents for 2-week follow-up. She has no issues or complaints today. She has  tolerated the wrap well. She denies signs of infection. 9/27; patient presents for 1 week follow-up. She has no issues or complaints today. 10/20; patient presents for follow-up. She was on vacation for the past 3 weeks. She states she had the last clinic visit wrap in place for 1 week and then she took this off. She states that she has been using her compression wraps since. She states that the wound has gotten larger. She reports pain to the wound site. She denies fever/chills, nausea/vomiting. She denies purulent drainage. 10/24; patient presents for follow-up. She states she tolerated the Kerlix/Coban wrap well. She reports decrease in pain. She states she started Keflex. She denies systemic signs of infection. 10/28; patient presents for follow-up. She has no issues or complaints today. She denies acute pain. She tolerated the wrap well. 11/4; patient presents for follow-up. She has no issues or complaints. She denies signs of infection. 11/11; patient presents for follow-up. She has no issues or complaints today. She denies signs of infection. She tolerated the wrap well. 11/18; patient presents for follow-up. She states she took a shower however despite a plastic bag the wrap got wet. She currently denies signs of infection. 12/2; patient presents for follow-up. She tolerated the wrap well. She has no issues or complaints today. Patient History Information obtained from Patient. Family History Diabetes -  Mother,Maternal Grandparents, Heart Disease - Paternal Grandparents, Hypertension - Mother,Maternal Grandparents, Lung Disease - Father,Paternal Grandparents, No family history of Cancer, Hereditary Spherocytosis, Kidney Disease, Seizures, Stroke, Thyroid Problems, Tuberculosis. Social History Former smoker, Marital Status - Single, Alcohol Use - Never, Drug Use - No History, Caffeine Use - Daily. Medical History Eyes Denies history of Cataracts, Glaucoma, Optic Neuritis Ear/Nose/Mouth/Throat Denies history of Chronic sinus problems/congestion, Middle ear problems Hematologic/Lymphatic Denies history of Anemia, Hemophilia, Human Immunodeficiency Virus, Lymphedema, Sickle Cell Disease Respiratory Patient has history of Sleep Apnea Denies history of Aspiration, Asthma, Chronic Obstructive Pulmonary Disease (COPD), Pneumothorax, Tuberculosis Cardiovascular Patient has history of Congestive Heart Failure, Coronary Artery Disease, Hypertension Denies history of Angina, Arrhythmia, Deep Vein Thrombosis, Hypotension, Myocardial Infarction, Peripheral Arterial Disease, Peripheral Venous Disease, Phlebitis, Vasculitis Gastrointestinal Denies history of Cirrhosis , Colitis, Crohnoos, Hepatitis A, Hepatitis B, Hepatitis C Endocrine Denies history of Type I Diabetes, Type II Diabetes Genitourinary Denies history of End Stage Renal Disease Immunological Denies history of Lupus Erythematosus, Raynaudoos, Scleroderma Integumentary (Skin) Denies history of History of Burn Musculoskeletal Denies history of Gout, Rheumatoid Arthritis, Osteoarthritis, Osteomyelitis Neurologic Denies history of Dementia, Neuropathy, Quadriplegia, Paraplegia, Seizure Disorder Oncologic Denies history of Received Chemotherapy, Received Radiation Psychiatric Denies history of Anorexia/bulimia, Confinement Anxiety Objective Constitutional respirations regular, non-labored and within target range for  patient.. Vitals Time Taken: 8:38 AM, Height: 67 in, Weight: 346 lbs, BMI: 54.2, Temperature: 98.3 F, Pulse: 86 bpm, Respiratory Rate: 18 breaths/min, Blood Pressure: 108/77 mmHg. Cardiovascular 2+ dorsalis pedis/posterior tibialis pulses. Psychiatric pleasant and cooperative. General Notes: Left lower extremity: On the medial aspect there is a pinpoint open wound with granulation tissue. No signs of infection. Good edema control. Integumentary (Hair, Skin) Wound #4 status is Open. Original cause of wound was Gradually Appeared. The date acquired was: 09/26/2020. The wound has been in treatment 23 weeks. The wound is located on the Left,Medial Lower Leg. The wound measures 0.1cm length x 0.1cm width x 0.1cm depth; 0.008cm^2 area and 0.001cm^3 volume. There is Fat Layer (Subcutaneous Tissue) exposed. There is no tunneling or undermining noted. There is a medium amount of serosanguineous drainage noted. There  is no granulation within the wound bed. There is no necrotic tissue within the wound bed. Assessment Active Problems ICD-10 Non-pressure chronic ulcer of other part of left lower leg with unspecified severity Chronic venous hypertension (idiopathic) with ulcer of left lower extremity Lymphedema, not elsewhere classified Personal history of other venous thrombosis and embolism Patient has done well with collagen under compression and her wound is almost completely healed. I recommended 1 more week of compression wrap with a foam nonadhesive dressing. She has compression stockings and I asked her to bring them to next clinic visit. Procedures Wound #4 Pre-procedure diagnosis of Wound #4 is a Venous Leg Ulcer located on the Left,Medial Lower Leg . There was a Three Layer Compression Therapy Procedure by Deon Pilling, RN. Post procedure Diagnosis Wound #4: Same as Pre-Procedure Plan Follow-up Appointments: Return Appointment in 1 week. - Dr. Heber Russell Bathing/ Shower/ Hygiene: May  shower with protection but do not get wound dressing(s) wet. - use cast protector to keep wrap dry in the shower Edema Control - Lymphedema / SCD / Other: Lymphedema Pumps. Use Lymphedema pumps on leg(s) 2-3 times a day for 45-60 minutes. If wearing any wraps or hose, do not remove them. Continue exercising as instructed. Elevate legs to the level of the heart or above for 30 minutes daily and/or when sitting, a frequency of: Avoid standing for long periods of time. Patient to wear own compression stockings every day. - right leg daily Exercise regularly Off-Loading: Turn and reposition every 2 hours - Do not apply pressure to these areas. WOUND #4: - Lower Leg Wound Laterality: Left, Medial Cleanser: Soap and Water 1 x Per Week/15 Days Discharge Instructions: May shower and wash wound with dial antibacterial soap and water prior to dressing change. Peri-Wound Care: Triamcinolone 15 (g) 1 x Per Week/15 Days Discharge Instructions: Use triamcinolone 15 (g) as directed Peri-Wound Care: Sween Lotion (Moisturizing lotion) 1 x Per Week/15 Days Discharge Instructions: Apply moisturizing lotion as directed Secondary Dressing: Optifoam Non-Adhesive Dressing, 4x4 in 1 x Per Week/15 Days Discharge Instructions: Apply over primary dressing as directed. Com pression Wrap: ThreePress (3 layer compression wrap) 1 x Per Week/15 Days Discharge Instructions: Apply three layer compression as directed. Com pression Wrap: Unnaboot w/Calamine, 4x10 (in/yd) 1 x Per Week/15 Days Discharge Instructions: Apply at top to anchor wrap 1. 3 layer compression with foam dressing 2. Follow-up in 1 week Electronic Signature(s) Signed: 03/30/2021 9:50:20 AM By: [name] DO Entered By: [name] on 03/30/2021 09:49:45 -------------------------------------------------------------------------------- HxROS Details Patient Name: Date of Service: [name], Annibelle D. 03/30/2021 8:30 A M Medical Record Number:  [id number] Patient Account Number: [id number] Date of Birth/Sex: Treating RN: 1970-12-20 (50 y.o. [name] Primary Care Provider: [name] Other Clinician: Referring Provider: Treating Provider/Extender: Sammuel Bailiff in Treatment: 23 Information Obtained From Patient Eyes Medical History: Negative for: Cataracts; Glaucoma; Optic Neuritis Ear/Nose/Mouth/Throat Medical History: Negative for: Chronic sinus problems/congestion; Middle ear problems Hematologic/Lymphatic Medical History: Negative for: Anemia; Hemophilia; Human Immunodeficiency Virus; Lymphedema; Sickle Cell Disease Respiratory Medical History: Positive for: Sleep Apnea Negative for: Aspiration; Asthma; Chronic Obstructive Pulmonary Disease (COPD); Pneumothorax; Tuberculosis Cardiovascular Medical History: Positive for: Congestive Heart Failure; Coronary Artery Disease; Hypertension Negative for: Angina; Arrhythmia; Deep Vein Thrombosis; Hypotension; Myocardial Infarction; Peripheral Arterial Disease; Peripheral Venous Disease; Phlebitis; Vasculitis Gastrointestinal Medical History: Negative for: Cirrhosis ; Colitis; Crohns; Hepatitis A; Hepatitis B; Hepatitis C Endocrine Medical History: Negative for: Type I Diabetes; Type II Diabetes Genitourinary Medical History: Negative  for: End Stage Renal Disease Immunological Medical History: Negative for: Lupus Erythematosus; Raynauds; Scleroderma Integumentary (Skin) Medical History: Negative for: History of Burn Musculoskeletal Medical History: Negative for: Gout; Rheumatoid Arthritis; Osteoarthritis; Osteomyelitis Neurologic Medical History: Negative for: Dementia; Neuropathy; Quadriplegia; Paraplegia; Seizure Disorder Oncologic Medical History: Negative for: Received Chemotherapy; Received Radiation Psychiatric Medical History: Negative for: Anorexia/bulimia; Confinement Anxiety Immunizations Pneumococcal  Vaccine: Received Pneumococcal Vaccination: No Implantable Devices None Family and Social History Cancer: No; Diabetes: Yes - Mother,Maternal Grandparents; Heart Disease: Yes - Paternal Grandparents; Hereditary Spherocytosis: No; Hypertension: Yes - Mother,Maternal Grandparents; Kidney Disease: No; Lung Disease: Yes - Father,Paternal Grandparents; Seizures: No; Stroke: No; Thyroid Problems: No; Tuberculosis: No; Former smoker; Marital Status - Single; Alcohol Use: Never; Drug Use: No History; Caffeine Use: Daily; Financial Concerns: No; Food, Clothing or Shelter Needs: No; Support System Lacking: No; Transportation Concerns: No Electronic Signature(s) Signed: 03/30/2021 9:50:20 AM By: [name] DO Signed: 04/03/2021 4:29:15 PM By: Baruch Gouty RN, BSN Entered By: [name] on 03/30/2021 09:47:34 -------------------------------------------------------------------------------- [place] Details Patient Name: Date of Service: [name], Tishawna D. 03/30/2021 Medical Record Number: [id number] Patient Account Number: [id number] Date of Birth/Sex: Treating RN: December 10, 1970 (50 y.o. [name], [name] Primary Care Provider: [name] Other Clinician: Referring Provider: Treating Provider/Extender: Sammuel Bailiff in Treatment: 23 Diagnosis Coding ICD-10 Codes Code Description [phone] Non-pressure chronic ulcer of other part of left lower leg with unspecified severity I87.312 Chronic venous hypertension (idiopathic) with ulcer of left lower extremity I89.0 Lymphedema, not elsewhere classified Z86.718 Personal history of other venous thrombosis and embolism Facility Procedures CPT4 Code: [id number] Description: (Facility Use Only) [phone] - [facility] LT LEG Modifier: Quantity: 1 Physician Procedures : CPT4 Code Description Modifier [id number] - WC PHYS LEVEL 3 - EST PT ICD-10 Diagnosis Description L97.829 Non-pressure chronic ulcer  of other part of left lower leg with unspecified severity I87.312 Chronic venous hypertension (idiopathic) with  ulcer of left lower extremity I89.0 Lymphedema, not elsewhere classified Z86.718 Personal history of other venous thrombosis and embolism Quantity: 1 Electronic Signature(s) Signed: 03/30/2021 9:50:20 AM By: [name] DO Entered By: [name] on 03/30/2021 09:50:01

## 2021-04-05 ENCOUNTER — Other Ambulatory Visit: Payer: Self-pay

## 2021-04-05 ENCOUNTER — Ambulatory Visit (HOSPITAL_BASED_OUTPATIENT_CLINIC_OR_DEPARTMENT_OTHER): Payer: Medicaid Other | Attending: Student | Admitting: Pulmonary Disease

## 2021-04-05 VITALS — Ht 62.0 in | Wt 389.0 lb

## 2021-04-05 DIAGNOSIS — G4733 Obstructive sleep apnea (adult) (pediatric): Secondary | ICD-10-CM | POA: Diagnosis present

## 2021-04-06 ENCOUNTER — Encounter (HOSPITAL_BASED_OUTPATIENT_CLINIC_OR_DEPARTMENT_OTHER): Payer: Medicaid Other | Admitting: Internal Medicine

## 2021-04-09 ENCOUNTER — Other Ambulatory Visit: Payer: Self-pay

## 2021-04-09 ENCOUNTER — Encounter (HOSPITAL_BASED_OUTPATIENT_CLINIC_OR_DEPARTMENT_OTHER): Payer: Medicaid Other | Admitting: Internal Medicine

## 2021-04-09 DIAGNOSIS — G4733 Obstructive sleep apnea (adult) (pediatric): Secondary | ICD-10-CM

## 2021-04-09 DIAGNOSIS — Z86718 Personal history of other venous thrombosis and embolism: Secondary | ICD-10-CM

## 2021-04-09 DIAGNOSIS — L97829 Non-pressure chronic ulcer of other part of left lower leg with unspecified severity: Secondary | ICD-10-CM | POA: Diagnosis not present

## 2021-04-09 DIAGNOSIS — I890 Lymphedema, not elsewhere classified: Secondary | ICD-10-CM

## 2021-04-09 DIAGNOSIS — I87312 Chronic venous hypertension (idiopathic) with ulcer of left lower extremity: Secondary | ICD-10-CM | POA: Diagnosis not present

## 2021-04-09 NOTE — Progress Notes (Signed)
[name] ([id number]) Visit Report for 04/09/2021 Chief Complaint Document Details Patient Name: Date of Service: [name] 04/09/2021 2:45 PM Medical Record Number: [id number] Patient Account Number: [id number] Date of Birth/Sex: Treating RN: 1971-02-22 (50 y.o. [name] Primary Care Provider: [name] Other Clinician: Referring Provider: Treating Provider/Extender: Sammuel Bailiff in Treatment: 24 Information Obtained from: Patient Chief Complaint Left lower extremity wound Electronic Signature(s) Signed: 04/09/2021 5:15:35 PM By: [name] DO Entered By: [name] on 04/09/2021 17:12:34 -------------------------------------------------------------------------------- HPI Details Patient Name: Date of Service: [name], [name] 04/09/2021 2:45 PM Medical Record Number: [id number] Patient Account Number: [id number] Date of Birth/Sex: Treating RN: March 21, 1971 (50 y.o. [name] Primary Care Provider: [name] Other Clinician: Referring Provider: Treating Provider/Extender: Sammuel Bailiff in Treatment: 24 History of Present Illness HPI Description: Admission 6/24 [name] is a 50 year old female with a past medical history of chronic venous insufficiency, lymphedema, DVT on anticoagulation and diastolic heart failure that presents to the clinic for left lower extremity wound. She was last seen 4 months ago in our clinic for the same issue. The reoccurring wound started at the end of May and has been going on for 1 month. She has been using an ointment and I am unclear what this is. She tries to keep her leg elevated with her compression stocking. She also reports she has lymphedema pumps and has been using them as well. She reports mild pain to the area. She denies signs of infection including increased warmth, erythema or purulent drainage. 7/1; patient  presents for 1 week follow-up. She has tolerated the wrap well. Unfortunately she did have trouble with the wrap Sliding down her leg 2 days ago. She has no issues or complaints today. She denies signs of infection. 7/15; patient presents for follow-up. She has tolerated the wrap well. She has no issues or complaints today. She denies signs of infection. 7/25; patient presents for 1 week follow-up. She has no issues or complaints today. She denies signs of infection. 8/4; patient presents for follow-up. She has tolerated the compression wrap well to her left lower extremity. She states she was in the hospital last week due to fluid overload. She subsequently developed blisters to her bottom from being in the hospital bed for prolonged periods of time. These have since ruptured. She now has 2 areas of skin breakdown. She has been keeping gauze on them. She denies signs of infection. 8/18; patient presents for 2-week follow-up. She reports improvement to her buttocks wounds. She is able to tolerate the wrap well to her left lower extremity with no issues. She denies signs of infection. 9/1; patient presents for 2-week follow-up. She has no issues or complaints today. she denies signs of infection. 9/15; patient presents for 2-week follow-up. She has no issues or complaints today. She has tolerated the wrap well. She denies signs of infection. 9/27; patient presents for 1 week follow-up. She has no issues or complaints today. 10/20; patient presents for follow-up. She was on vacation for the past 3 weeks. She states she had the last clinic visit wrap in place for 1 week and then she took this off. She states that she has been using her compression wraps since. She states that the wound has gotten larger. She reports pain to the wound site. She denies fever/chills, nausea/vomiting. She denies purulent drainage. 10/24; patient presents for follow-up. She states she tolerated the Kerlix/Coban wrap well.  She  reports decrease in pain. She states she started Keflex. She denies systemic signs of infection. 10/28; patient presents for follow-up. She has no issues or complaints today. She denies acute pain. She tolerated the wrap well. 11/4; patient presents for follow-up. She has no issues or complaints. She denies signs of infection. 11/11; patient presents for follow-up. She has no issues or complaints today. She denies signs of infection. She tolerated the wrap well. 11/18; patient presents for follow-up. She states she took a shower however despite a plastic bag the wrap got wet. She currently denies signs of infection. 12/2; patient presents for follow-up. She tolerated the wrap well. She has no issues or complaints today. 12/12; patient presents for follow-up. She has no issues or complaints today. Electronic Signature(s) Signed: 04/09/2021 5:15:35 PM By: [name] DO Entered By: [name] on 04/09/2021 17:12:51 -------------------------------------------------------------------------------- Physical Exam Details Patient Name: Date of Service: Megan Session D. 04/09/2021 2:45 PM Medical Record Number: [id number] Patient Account Number: [id number] Date of Birth/Sex: Treating RN: 11/21/1970 (50 y.o. [name] Primary Care Provider: [name] Other Clinician: Referring Provider: Treating Provider/Extender: Sammuel Bailiff in Treatment: 24 Constitutional respirations regular, non-labored and within target range for patient.. Cardiovascular 2+ dorsalis pedis/posterior tibialis pulses. Psychiatric pleasant and cooperative. Notes Left lower extremity: On the medial aspect there is epithelialization to the previous wound site. Surrounding skin is intact. Electronic Signature(s) Signed: 04/09/2021 5:15:35 PM By: [name] DO Entered By: [name] on 04/09/2021  17:13:26 -------------------------------------------------------------------------------- Physician Orders Details Patient Name: Date of Service: Megan Session D. 04/09/2021 2:45 PM Medical Record Number: [id number] Patient Account Number: [id number] Date of Birth/Sex: Treating RN: March 19, 1971 (50 y.o. [name] Primary Care Provider: [name] Other Clinician: Referring Provider: Treating Provider/Extender: Sammuel Bailiff in Treatment: 24 Verbal / Phone Orders: No Diagnosis Coding ICD-10 Coding Code Description [phone] Non-pressure chronic ulcer of other part of left lower leg with unspecified severity I87.312 Chronic venous hypertension (idiopathic) with ulcer of left lower extremity I89.0 Lymphedema, not elsewhere classified Z86.718 Personal history of other venous thrombosis and embolism Discharge From [facility] Services Discharge from [place] Bathing/ Shower/ Hygiene May shower and wash wound with soap and water. Edema Control - Lymphedema / SCD / Other Bilateral Lower Extremities Lymphedema Pumps. Use Lymphedema pumps on leg(s) 2-3 times a day for 45-60 minutes. If wearing any wraps or hose, do not remove them. Continue exercising as instructed. Elevate legs to the level of the heart or above for 30 minutes daily and/or when sitting, a frequency of: - throughout the day Avoid standing for long periods of time. Patient to wear own compression stockings every day. - right leg daily Exercise regularly Moisturize legs daily. Compression stocking or Garment 20-30 mm/Hg pressure to: Electronic Signature(s) Signed: 04/09/2021 5:15:35 PM By: [name] DO Previous Signature: 04/09/2021 4:53:33 PM Version By: Baruch Gouty RN, BSN Entered By: [name] on 04/09/2021 17:13:40 -------------------------------------------------------------------------------- Problem List Details Patient Name: Date of Service: [name],  [name] 04/09/2021 2:45 PM Medical Record Number: [id number] Patient Account Number: [id number] Date of Birth/Sex: Treating RN: 1970-12-19 (50 y.o. [name] Primary Care Provider: [name] Other Clinician: Referring Provider: Treating Provider/Extender: Sammuel Bailiff in Treatment: 24 Active Problems ICD-10 Encounter Code Description Active Date MDM Diagnosis L97.829 Non-pressure chronic ulcer of other part of left lower leg with unspecified 10/20/2020 No Yes severity I87.312 Chronic venous hypertension (idiopathic) with ulcer  of left lower extremity 03/09/2021 No Yes I89.0 Lymphedema, not elsewhere classified 10/20/2020 No Yes Z86.718 Personal history of other venous thrombosis and embolism 10/20/2020 No Yes Inactive Problems Resolved Problems Electronic Signature(s) Signed: 04/09/2021 5:15:35 PM By: [name] DO Previous Signature: 04/09/2021 4:53:33 PM Version By: Baruch Gouty RN, BSN Entered By: [name] on 04/09/2021 17:12:20 -------------------------------------------------------------------------------- Progress Note Details Patient Name: Date of Service: [name], [name] 04/09/2021 2:45 PM Medical Record Number: [id number] Patient Account Number: [id number] Date of Birth/Sex: Treating RN: 1971-02-05 (50 y.o. [name] Primary Care Provider: [name] Other Clinician: Referring Provider: Treating Provider/Extender: Sammuel Bailiff in Treatment: 24 Subjective Chief Complaint Information obtained from Patient Left lower extremity wound History of Present Illness (HPI) Admission 6/24 Ms. Zayah Keilman is a 50 year old female with a past medical history of chronic venous insufficiency, lymphedema, DVT on anticoagulation and diastolic heart failure that presents to the clinic for left lower extremity wound. She was last seen 4 months ago in our clinic for the same  issue. The reoccurring wound started at the end of May and has been going on for 1 month. She has been using an ointment and I am unclear what this is. She tries to keep her leg elevated with her compression stocking. She also reports she has lymphedema pumps and has been using them as well. She reports mild pain to the area. She denies signs of infection including increased warmth, erythema or purulent drainage. 7/1; patient presents for 1 week follow-up. She has tolerated the wrap well. Unfortunately she did have trouble with the wrap Sliding down her leg 2 days ago. She has no issues or complaints today. She denies signs of infection. 7/15; patient presents for follow-up. She has tolerated the wrap well. She has no issues or complaints today. She denies signs of infection. 7/25; patient presents for 1 week follow-up. She has no issues or complaints today. She denies signs of infection. 8/4; patient presents for follow-up. She has tolerated the compression wrap well to her left lower extremity. She states she was in the hospital last week due to fluid overload. She subsequently developed blisters to her bottom from being in the hospital bed for prolonged periods of time. These have since ruptured. She now has 2 areas of skin breakdown. She has been keeping gauze on them. She denies signs of infection. 8/18; patient presents for 2-week follow-up. She reports improvement to her buttocks wounds. She is able to tolerate the wrap well to her left lower extremity with no issues. She denies signs of infection. 9/1; patient presents for 2-week follow-up. She has no issues or complaints today. she denies signs of infection. 9/15; patient presents for 2-week follow-up. She has no issues or complaints today. She has tolerated the wrap well. She denies signs of infection. 9/27; patient presents for 1 week follow-up. She has no issues or complaints today. 10/20; patient presents for follow-up. She was on  vacation for the past 3 weeks. She states she had the last clinic visit wrap in place for 1 week and then she took this off. She states that she has been using her compression wraps since. She states that the wound has gotten larger. She reports pain to the wound site. She denies fever/chills, nausea/vomiting. She denies purulent drainage. 10/24; patient presents for follow-up. She states she tolerated the Kerlix/Coban wrap well. She reports decrease in pain. She states she started Keflex. She denies systemic signs of infection. 10/28; patient presents  for follow-up. She has no issues or complaints today. She denies acute pain. She tolerated the wrap well. 11/4; patient presents for follow-up. She has no issues or complaints. She denies signs of infection. 11/11; patient presents for follow-up. She has no issues or complaints today. She denies signs of infection. She tolerated the wrap well. 11/18; patient presents for follow-up. She states she took a shower however despite a plastic bag the wrap got wet. She currently denies signs of infection. 12/2; patient presents for follow-up. She tolerated the wrap well. She has no issues or complaints today. 12/12; patient presents for follow-up. She has no issues or complaints today. Patient History Information obtained from Patient. Family History Diabetes - Mother,Maternal Grandparents, Heart Disease - Paternal Grandparents, Hypertension - Mother,Maternal Grandparents, Lung Disease - Father,Paternal Grandparents, No family history of Cancer, Hereditary Spherocytosis, Kidney Disease, Seizures, Stroke, Thyroid Problems, Tuberculosis. Social History Former smoker, Marital Status - Single, Alcohol Use - Never, Drug Use - No History, Caffeine Use - Daily. Medical History Eyes Denies history of Cataracts, Glaucoma, Optic Neuritis Ear/Nose/Mouth/Throat Denies history of Chronic sinus problems/congestion, Middle ear problems Hematologic/Lymphatic Denies  history of Anemia, Hemophilia, Human Immunodeficiency Virus, Lymphedema, Sickle Cell Disease Respiratory Patient has history of Sleep Apnea Denies history of Aspiration, Asthma, Chronic Obstructive Pulmonary Disease (COPD), Pneumothorax, Tuberculosis Cardiovascular Patient has history of Congestive Heart Failure, Coronary Artery Disease, Hypertension Denies history of Angina, Arrhythmia, Deep Vein Thrombosis, Hypotension, Myocardial Infarction, Peripheral Arterial Disease, Peripheral Venous Disease, Phlebitis, Vasculitis Gastrointestinal Denies history of Cirrhosis , Colitis, Crohnoos, Hepatitis A, Hepatitis B, Hepatitis C Endocrine Denies history of Type I Diabetes, Type II Diabetes Genitourinary Denies history of End Stage Renal Disease Immunological Denies history of Lupus Erythematosus, Raynaudoos, Scleroderma Integumentary (Skin) Denies history of History of Burn Musculoskeletal Denies history of Gout, Rheumatoid Arthritis, Osteoarthritis, Osteomyelitis Neurologic Denies history of Dementia, Neuropathy, Quadriplegia, Paraplegia, Seizure Disorder Oncologic Denies history of Received Chemotherapy, Received Radiation Psychiatric Denies history of Anorexia/bulimia, Confinement Anxiety Objective Constitutional respirations regular, non-labored and within target range for patient.. Vitals Time Taken: 3:15 PM, Height: 67 in, Weight: 346 lbs, BMI: 54.2, Temperature: 98.2 F, Pulse: 91 bpm, Respiratory Rate: 20 breaths/min, Blood Pressure: 132/85 mmHg. Cardiovascular 2+ dorsalis pedis/posterior tibialis pulses. Psychiatric pleasant and cooperative. General Notes: Left lower extremity: On the medial aspect there is epithelialization to the previous wound site. Surrounding skin is intact. Integumentary (Hair, Skin) Wound #4 status is Healed - Epithelialized. Original cause of wound was Gradually Appeared. The date acquired was: 09/26/2020. The wound has been in treatment 24 weeks.  The wound is located on the Left,Medial Lower Leg. The wound measures 0cm length x 0cm width x 0cm depth; 0cm^2 area and 0cm^3 volume. There is Fat Layer (Subcutaneous Tissue) exposed. There is a medium amount of serosanguineous drainage noted. There is no granulation within the wound bed. There is no necrotic tissue within the wound bed. Assessment Active Problems ICD-10 Non-pressure chronic ulcer of other part of left lower leg with unspecified severity Chronic venous hypertension (idiopathic) with ulcer of left lower extremity Lymphedema, not elsewhere classified Personal history of other venous thrombosis and embolism Patient did well with collagen. Her wound is healed. I recommended compression stockings daily. She brought her compression stocking with her. She knows to call with any questions or concerns and can follow-up as needed. Plan Discharge From [facility] Services: Discharge from [facility] Bathing/ Shower/ Hygiene: May shower and wash wound with soap and water. Edema Control - Lymphedema / SCD / Other:  Lymphedema Pumps. Use Lymphedema pumps on leg(s) 2-3 times a day for 45-60 minutes. If wearing any wraps or hose, do not remove them. Continue exercising as instructed. Elevate legs to the level of the heart or above for 30 minutes daily and/or when sitting, a frequency of: - throughout the day Avoid standing for long periods of time. Patient to wear own compression stockings every day. - right leg daily Exercise regularly Moisturize legs daily. Compression stocking or Garment 20-30 mm/Hg pressure to: 1. Discharge from clinic due to closed wound 2. Follow-up as needed 3. Compression stockings daily Electronic Signature(s) Signed: 04/09/2021 5:15:35 PM By: [name] DO Entered By: [name] on 04/09/2021 17:14:58 -------------------------------------------------------------------------------- HxROS Details Patient Name: Date of Service: [name], Megan  D. 04/09/2021 2:45 PM Medical Record Number: [id number] Patient Account Number: [id number] Date of Birth/Sex: Treating RN: 01/14/1971 (50 y.o. [name] Primary Care Provider: [name] Other Clinician: Referring Provider: Treating Provider/Extender: Sammuel Bailiff in Treatment: 24 Information Obtained From Patient Eyes Medical History: Negative for: Cataracts; Glaucoma; Optic Neuritis Ear/Nose/Mouth/Throat Medical History: Negative for: Chronic sinus problems/congestion; Middle ear problems Hematologic/Lymphatic Medical History: Negative for: Anemia; Hemophilia; Human Immunodeficiency Virus; Lymphedema; Sickle Cell Disease Respiratory Medical History: Positive for: Sleep Apnea Negative for: Aspiration; Asthma; Chronic Obstructive Pulmonary Disease (COPD); Pneumothorax; Tuberculosis Cardiovascular Medical History: Positive for: Congestive Heart Failure; Coronary Artery Disease; Hypertension Negative for: Angina; Arrhythmia; Deep Vein Thrombosis; Hypotension; Myocardial Infarction; Peripheral Arterial Disease; Peripheral Venous Disease; Phlebitis; Vasculitis Gastrointestinal Medical History: Negative for: Cirrhosis ; Colitis; Crohns; Hepatitis A; Hepatitis B; Hepatitis C Endocrine Medical History: Negative for: Type I Diabetes; Type II Diabetes Genitourinary Medical History: Negative for: End Stage Renal Disease Immunological Medical History: Negative for: Lupus Erythematosus; Raynauds; Scleroderma Integumentary (Skin) Medical History: Negative for: History of Burn Musculoskeletal Medical History: Negative for: Gout; Rheumatoid Arthritis; Osteoarthritis; Osteomyelitis Neurologic Medical History: Negative for: Dementia; Neuropathy; Quadriplegia; Paraplegia; Seizure Disorder Oncologic Medical History: Negative for: Received Chemotherapy; Received Radiation Psychiatric Medical History: Negative for: Anorexia/bulimia;  Confinement Anxiety Immunizations Pneumococcal Vaccine: Received Pneumococcal Vaccination: No Implantable Devices None Family and Social History Cancer: No; Diabetes: Yes - Mother,Maternal Grandparents; Heart Disease: Yes - Paternal Grandparents; Hereditary Spherocytosis: No; Hypertension: Yes - Mother,Maternal Grandparents; Kidney Disease: No; Lung Disease: Yes - Father,Paternal Grandparents; Seizures: No; Stroke: No; Thyroid Problems: No; Tuberculosis: No; Former smoker; Marital Status - Single; Alcohol Use: Never; Drug Use: No History; Caffeine Use: Daily; Financial Concerns: No; Food, Clothing or Shelter Needs: No; Support System Lacking: No; Transportation Concerns: No Electronic Signature(s) Signed: 04/09/2021 5:15:35 PM By: [name] DO Signed: 04/09/2021 5:29:47 PM By: [name] Entered By: [name] on 04/09/2021 17:13:00 -------------------------------------------------------------------------------- SuperBill Details Patient Name: Date of Service: [name], [name] 04/09/2021 Medical Record Number: [id number] Patient Account Number: [id number] Date of Birth/Sex: Treating RN: 1970-09-26 (50 y.o. [name] Primary Care Provider: [name] Other Clinician: Referring Provider: Treating Provider/Extender: Sammuel Bailiff in Treatment: 24 Diagnosis Coding ICD-10 Codes Code Description [phone] Non-pressure chronic ulcer of other part of left lower leg with unspecified severity I87.312 Chronic venous hypertension (idiopathic) with ulcer of left lower extremity I89.0 Lymphedema, not elsewhere classified Z86.718 Personal history of other venous thrombosis and embolism Facility Procedures CPT4 Code: [id number] Description: 99213 - WOUND CARE VISIT-LEV 3 EST PT Modifier: Quantity: 1 Physician Procedures : CPT4 Code Description Modifier [id number] - WC PHYS LEVEL 3 - EST PT ICD-10 Diagnosis Description L97.829  Non-pressure chronic ulcer of other  part of left lower leg with unspecified severity I87.312 Chronic venous hypertension (idiopathic) with  ulcer of left lower extremity I89.0 Lymphedema, not elsewhere classified Z86.718 Personal history of other venous thrombosis and embolism Quantity: 1 Electronic Signature(s) Signed: 04/09/2021 5:15:35 PM By: [name] DO Previous Signature: 04/09/2021 4:53:33 PM Version By: Baruch Gouty RN, BSN Entered By: [name] on 04/09/2021 17:15:15

## 2021-04-09 NOTE — Procedures (Signed)
     Patient Name: [name], [name] Date: 04/05/2021 Gender: Female D.O.B: Nov 30, 1970 Age (years): 50 Referring Provider: Maryjane Hurter Height (inches): 17 Interpreting Physician: Chesley Mires MD, ABSM Weight (lbs): 389 RPSGT: Baxter Flattery BMI: 71 MRN: [id number] Neck Size: 15.00  CLINICAL INFORMATION Sleep Study Type: NPSG  Indication for sleep study: Morbid Obesity, Obesity, Snoring, Witnesses Apnea / Gasping During Sleep  Epworth Sleepiness Score: 6  SLEEP STUDY TECHNIQUE As per the AASM Manual for the Scoring of Sleep and Associated Events v2.3 (April 2016) with a hypopnea requiring 4% desaturations.  The channels recorded and monitored were frontal, central and occipital EEG, electrooculogram (EOG), submentalis EMG (chin), nasal and oral airflow, thoracic and abdominal wall motion, anterior tibialis EMG, snore microphone, electrocardiogram, and pulse oximetry.  MEDICATIONS Medications self-administered by patient taken the night of the study : N/A  SLEEP ARCHITECTURE The study was initiated at 10:06:09 PM and ended at 4:33:44 AM.  Sleep onset time was 23.3 minutes and the sleep efficiency was 92.4%%. The total sleep time was 358.3 minutes.  Stage REM latency was 47.0 minutes.  The patient spent 2.2%% of the night in stage N1 sleep, 59.0%% in stage N2 sleep, 0.0%% in stage N3 and 38.8% in REM.  Alpha intrusion was absent.  Supine sleep was 100.00%.  RESPIRATORY PARAMETERS The overall apnea/hypopnea index (AHI) was 24.4 per hour. There were 39 total apneas, including 36 obstructive, 3 central and 0 mixed apneas. There were 107 hypopneas and 0 RERAs.  She did not have sufficient number of events at the beginning of the study to meet split night criteria.  The AHI during Stage REM sleep was 54.0 per hour.  AHI while supine was 24.4 per hour.  The mean oxygen saturation was 91.8%. The minimum SpO2 during sleep was 77.0%.  none snoring was noted during  this study.  CARDIAC DATA The 2 lead EKG demonstrated sinus rhythm. The mean heart rate was 76.7 beats per minute. Other EKG findings include: None.  LEG MOVEMENT DATA The total PLMS were 0 with a resulting PLMS index of 0.0. Associated arousal with leg movement index was 0.0 .  IMPRESSIONS - Moderate obstructive sleep apnea with an AHI of 24.4 and SpO2 low of 77%.  The majority of events occurred in REM sleep with a REM AHI of 54. - Supplemental oxygen was not used during the study.  DIAGNOSIS - Obstructive Sleep Apnea (G47.33)  RECOMMENDATIONS - Additional therapies include weight loss, CPAP, oral appliance, or surgical assessment. - Avoid alcohol, sedatives and other CNS depressants that may worsen sleep apnea and disrupt normal sleep architecture. - Sleep hygiene should be reviewed to assess factors that may improve sleep quality.  [Electronically signed] 04/09/2021 01:49 PM  Chesley Mires MD, ABSM Diplomate, American Board of Sleep Medicine   NPI: [id number]  [place] PH: [phone]   FX: [phone] [place]

## 2021-04-09 NOTE — Progress Notes (Signed)
[name], [name] ([id number]) Visit Report for 04/09/2021 Arrival Information Details Patient Name: Date of Service: [name], [name] 04/09/2021 2:45 PM Medical Record Number: [id number] Patient Account Number: [id number] Date of Birth/Sex: Treating RN: 09-17-1970 (50 y.o. [name], [name] Primary Care Ostin Mathey: [name] Other Clinician: Referring Dorrie Cocuzza: Treating Ronel Rodeheaver/Extender: Sammuel Bailiff in Treatment: 24 Visit Information History Since Last Visit Added or deleted any medications: No Patient Arrived: Ambulatory Any new allergies or adverse reactions: No Arrival Time: 15:16 Had a fall or experienced change in No Accompanied By: self activities of daily living that may affect Transfer Assistance: None risk of falls: Patient Identification Verified: Yes Signs or symptoms of abuse/neglect since last visito No Secondary Verification Process Completed: Yes Hospitalized since last visit: No Patient Requires Transmission-Based Precautions: No Implantable device outside of the clinic excluding No Patient Has Alerts: Yes cellular tissue based products placed in the center Patient Alerts: ABI's: 11/21 L:1.17 since last visit: Has Dressing in Place as Prescribed: Yes Has Compression in Place as Prescribed: Yes Pain Present Now: No Electronic Signature(s) Signed: 04/09/2021 6:04:36 PM By: Deon Pilling RN, BSN Entered By: Deon Pilling on 04/09/2021 15:16:57 -------------------------------------------------------------------------------- Clinic Level of Care Assessment Details Patient Name: Date of Service: [name], [name] D. 04/09/2021 2:45 PM Medical Record Number: [id number] Patient Account Number: [id number] Date of Birth/Sex: Treating RN: 01/20/1971 (50 y.o. [name] Primary Care Eddrick Dilone: [name] Other Clinician: Referring Mayu Ronk: Treating Alleah Dearman/Extender: Sammuel Bailiff in  Treatment: 24 Clinic Level of Care Assessment Items TOOL 4 Quantity Score []  - 0 Use when only an EandM is performed on FOLLOW-UP visit ASSESSMENTS - Nursing Assessment / Reassessment X- 1 10 Reassessment of Co-morbidities (includes updates in patient status) X- 1 5 Reassessment of Adherence to Treatment Plan ASSESSMENTS - Wound and Skin A ssessment / Reassessment X - Simple Wound Assessment / Reassessment - one wound 1 5 []  - 0 Complex Wound Assessment / Reassessment - multiple wounds []  - 0 Dermatologic / Skin Assessment (not related to wound area) ASSESSMENTS - Focused Assessment X- 1 5 Circumferential Edema Measurements - multi extremities []  - 0 Nutritional Assessment / Counseling / Intervention X- 1 5 Lower Extremity Assessment (monofilament, tuning fork, pulses) []  - 0 Peripheral Arterial Disease Assessment (using hand held doppler) ASSESSMENTS - Ostomy and/or Continence Assessment and Care []  - 0 Incontinence Assessment and Management []  - 0 Ostomy Care Assessment and Management (repouching, etc.) PROCESS - Coordination of Care X - Simple Patient / Family Education for ongoing care 1 15 []  - 0 Complex (extensive) Patient / Family Education for ongoing care X- 1 10 Staff obtains Programmer, systems, Records, T Results / Process Orders est []  - 0 Staff telephones HHA, Nursing Homes / Clarify orders / etc []  - 0 Routine Transfer to another Facility (non-emergent condition) []  - 0 Routine Hospital Admission (non-emergent condition) []  - 0 New Admissions / Biomedical engineer / Ordering NPWT Apligraf, etc. , []  - 0 Emergency Hospital Admission (emergent condition) X- 1 10 Simple Discharge Coordination []  - 0 Complex (extensive) Discharge Coordination PROCESS - Special Needs []  - 0 Pediatric / Minor Patient Management []  - 0 Isolation Patient Management []  - 0 Hearing / Language / Visual special needs []  - 0 Assessment of Community assistance (transportation,  D/C planning, etc.) []  - 0 Additional assistance / Altered mentation []  - 0 Support Surface(s) Assessment (bed, cushion, seat, etc.) INTERVENTIONS - Wound Cleansing / Measurement X - Simple Wound Cleansing -  one wound 1 5 []  - 0 Complex Wound Cleansing - multiple wounds X- 1 5 Wound Imaging (photographs - any number of wounds) []  - 0 Wound Tracing (instead of photographs) []  - 0 Simple Wound Measurement - one wound []  - 0 Complex Wound Measurement - multiple wounds INTERVENTIONS - Wound Dressings []  - 0 Small Wound Dressing one or multiple wounds []  - 0 Medium Wound Dressing one or multiple wounds []  - 0 Large Wound Dressing one or multiple wounds X- 1 5 Application of Medications - topical []  - 0 Application of Medications - injection INTERVENTIONS - Miscellaneous []  - 0 External ear exam []  - 0 Specimen Collection (cultures, biopsies, blood, body fluids, etc.) []  - 0 Specimen(s) / Culture(s) sent or taken to Lab for analysis []  - 0 Patient Transfer (multiple staff / Civil Service fast streamer / Similar devices) []  - 0 Simple Staple / Suture removal (25 or less) []  - 0 Complex Staple / Suture removal (26 or more) []  - 0 Hypo / Hyperglycemic Management (close monitor of Blood Glucose) []  - 0 Ankle / Brachial Index (ABI) - do not check if billed separately X- 1 5 Vital Signs Has the patient been seen at the hospital within the last three years: Yes Total Score: 85 Level Of Care: New/Established - Level 3 Electronic Signature(s) Signed: 04/09/2021 4:53:33 PM By: Baruch Gouty RN, BSN Entered By: Baruch Gouty on 04/09/2021 15:50:04 -------------------------------------------------------------------------------- Encounter Discharge Information Details Patient Name: Date of Service: [name], [name] 04/09/2021 2:45 PM Medical Record Number: [id number] Patient Account Number: [id number] Date of Birth/Sex: Treating RN: 03/18/1971 (50 y.o. [name] Primary Care  Riyaan Heroux: [name] Other Clinician: Referring Joeann Steppe: Treating Rayhaan Huster/Extender: Sammuel Bailiff in Treatment: 24 Encounter Discharge Information Items Discharge Condition: Stable Ambulatory Status: Ambulatory Discharge Destination: Home Transportation: Private Auto Accompanied By: self Schedule Follow-up Appointment: Yes Clinical Summary of Care: Patient Declined Electronic Signature(s) Signed: 04/09/2021 4:53:33 PM By: Baruch Gouty RN, BSN Entered By: Baruch Gouty on 04/09/2021 15:57:33 -------------------------------------------------------------------------------- Lower Extremity Assessment Details Patient Name: Date of Service: Megan Session D. 04/09/2021 2:45 PM Medical Record Number: [id number] Patient Account Number: [id number] Date of Birth/Sex: Treating RN: [birth year] (50 y.o. [name] Primary Care Mariyam Remington: [name] Other Clinician: Referring Maisey Deandrade: Treating Annitta Fifield/Extender: Sammuel Bailiff in Treatment: 24 Edema Assessment Assessed: [name]: Yes] [name]: No] Edema: [Left: Ye] [Right: s] Calf Left: Right: Point of Measurement: 25 cm From Medial Instep 42 cm Ankle Left: Right: Point of Measurement: 9 cm From Medial Instep 23 cm Vascular Assessment Pulses: Dorsalis Pedis Palpable: [Left:Yes] Electronic Signature(s) Signed: 04/09/2021 6:04:36 PM By: Deon Pilling RN, BSN Entered By: Deon Pilling on 04/09/2021 15:17:47 -------------------------------------------------------------------------------- Multi Wound Chart Details Patient Name: Date of Service: [name], [name] 04/09/2021 2:45 PM Medical Record Number: [id number] Patient Account Number: [id number] Date of Birth/Sex: Treating RN: [birth year] (50 y.o. [name] Primary Care Laketta Soderberg: [name] Other Clinician: Referring Leanard Dimaio: Treating Caitrin Pendergraph/Extender: Sammuel Bailiff in Treatment: 24 Vital Signs Height(in): 67 Pulse(bpm): 91 Weight(lbs): 346 Blood Pressure(mmHg): 132/85 Body Mass Index(BMI): 54 Temperature(F): 98.2 Respiratory Rate(breaths/min): 20 Photos: [N/A:N/A] Left, Medial Lower Leg N/A N/A Wound Location: Gradually Appeared N/A N/A Wounding Event: Venous Leg Ulcer N/A N/A Primary Etiology: Sleep Apnea, Congestive Heart N/A N/A Comorbid History: Failure, Coronary Artery Disease, Hypertension 09/26/2020 N/A N/A Date Acquired: 24 N/A N/A Weeks of Treatment: Healed - Epithelialized N/A N/A Wound Status: 0x0x0 N/A N/A Measurements  L x W x D (cm) 0 N/A N/A A (cm) : rea 0 N/A N/A Volume (cm) : 100.00% N/A N/A % Reduction in Area: 100.00% N/A N/A % Reduction in Volume: Full Thickness Without Exposed N/A N/A Classification: Support Structures Medium N/A N/A Exudate Amount: Serosanguineous N/A N/A Exudate Type: red, brown N/A N/A Exudate Color: None Present (0%) N/A N/A Granulation Amount: None Present (0%) N/A N/A Necrotic Amount: Fat Layer (Subcutaneous Tissue): Yes N/A N/A Exposed Structures: Fascia: No Tendon: No Muscle: No Joint: No Bone: No Large (67-100%) N/A N/A Epithelialization: Treatment Notes Wound #4 (Lower Leg) Wound Laterality: Left, Medial Cleanser Peri-Wound Care Topical Primary Dressing Secondary Dressing Secured With Compression Wrap Compression Stockings Add-Ons Electronic Signature(s) Signed: 04/09/2021 5:15:35 PM By: [name] DO Signed: 04/09/2021 5:29:47 PM By: [name] Entered By: [name] on 04/09/2021 17:12:26 -------------------------------------------------------------------------------- Multi-Disciplinary Care Plan Details Patient Name: Date of Service: Megan Session D. 04/09/2021 2:45 PM Medical Record Number: [id number] Patient Account Number: [id number] Date of Birth/Sex: Treating RN: 07-Jul-1970 (50 y.o. [name] Primary Care Daleen Steinhaus: [name] Other Clinician: Referring Graham Hyun: Treating Jaquell Seddon/Extender: Sammuel Bailiff in Treatment: Jansen reviewed with physician Active Inactive Electronic Signature(s) Signed: 04/09/2021 4:53:33 PM By: Baruch Gouty RN, BSN Entered By: Baruch Gouty on 04/09/2021 15:32:40 -------------------------------------------------------------------------------- Pain Assessment Details Patient Name: Date of Service: Megan Session D. 04/09/2021 2:45 PM Medical Record Number: [id number] Patient Account Number: [id number] Date of Birth/Sex: Treating RN: [birth year] (50 y.o. [name] Primary Care Colen Eltzroth: [name] Other Clinician: Referring Shahrukh Pasch: Treating Aleen Marston/Extender: Sammuel Bailiff in Treatment: 24 Active Problems Location of Pain Severity and Description of Pain Patient Has Paino No Site Locations Rate the pain. Rate the pain. Current Pain Level: 0 Pain Management and Medication Current Pain Management: Medication: No Cold Application: No Rest: No Massage: No Activity: No T.E.N.S.: No Heat Application: No Leg drop or elevation: No Is the Current Pain Management Adequate: Adequate How does your wound impact your activities of daily livingo Sleep: No Bathing: No Appetite: No Relationship With Others: No Bladder Continence: No Emotions: No Bowel Continence: No Work: No Toileting: No Drive: No Dressing: No Hobbies: No Engineer, maintenance) Signed: 04/09/2021 6:04:36 PM By: Deon Pilling RN, BSN Entered By: Deon Pilling on 04/09/2021 15:17:25 -------------------------------------------------------------------------------- Patient/Caregiver Education Details Patient Name: Date of Service: Megan Session D. 12/12/2022andnbsp2:45 PM Medical Record Number: [id number] Patient Account Number: [id number] Date of  Birth/Gender: Treating RN: 07-May-1970 (50 y.o. [name] Primary Care Physician: [name] Other Clinician: Referring Physician: Treating Physician/Extender: Sammuel Bailiff in Treatment: 24 Education Assessment Education Provided To: Patient Education Topics Provided Venous: Methods: Explain/Verbal Responses: Reinforcements needed, State content correctly Wound/Skin Impairment: Methods: Explain/Verbal Responses: Reinforcements needed, State content correctly Electronic Signature(s) Signed: 04/09/2021 4:53:33 PM By: Baruch Gouty RN, BSN Signed: 04/09/2021 4:53:33 PM By: Baruch Gouty RN, BSN Entered By: Baruch Gouty on 04/09/2021 15:33:04 -------------------------------------------------------------------------------- Wound Assessment Details Patient Name: Date of Service: [name], [name] 04/09/2021 2:45 PM Medical Record Number: [id number] Patient Account Number: [id number] Date of Birth/Sex: Treating RN: 12-Dec-1970 (50 y.o. [name] Primary Care Anushree Dorsi: [name] Other Clinician: Referring Leotha Westermeyer: Treating Laquinton Bihm/Extender: Sammuel Bailiff in Treatment: 24 Wound Status Wound Number: 4 Primary Venous Leg Ulcer Etiology: Wound Location: Left, Medial Lower Leg Wound Healed - Epithelialized Wounding Event: Gradually Appeared Status: Date Acquired: 09/26/2020 Comorbid Sleep Apnea, Congestive Heart Failure, Coronary  Artery Weeks Of Treatment: 24 History: Disease, Hypertension Clustered Wound: No Photos Wound Measurements Length: (cm) Width: (cm) Depth: (cm) Area: (cm) Volume: (cm) 0 % Reduction in Area: 100% 0 % Reduction in Volume: 100% 0 Epithelialization: Large (67-100%) 0 0 Wound Description Classification: Full Thickness Without Exposed Support Structures Exudate Amount: Medium Exudate Type: Serosanguineous Exudate Color: red, brown Foul Odor After  Cleansing: No Slough/Fibrino No Wound Bed Granulation Amount: None Present (0%) Exposed Structure Necrotic Amount: None Present (0%) Fascia Exposed: No Fat Layer (Subcutaneous Tissue) Exposed: Yes Tendon Exposed: No Muscle Exposed: No Joint Exposed: No Bone Exposed: No Treatment Notes Wound #4 (Lower Leg) Wound Laterality: Left, Medial Cleanser Peri-Wound Care Topical Primary Dressing Secondary Dressing Secured With Compression Wrap Compression Stockings Add-Ons Electronic Signature(s) Signed: 04/09/2021 4:21:45 PM By: Valeria Batman EMT Signed: 04/09/2021 6:04:36 PM By: Deon Pilling RN, BSN Entered By: Valeria Batman on 04/09/2021 16:21:44 -------------------------------------------------------------------------------- Vitals Details Patient Name: Date of Service: [name], [name] 04/09/2021 2:45 PM Medical Record Number: [id number] Patient Account Number: [id number] Date of Birth/Sex: Treating RN: 1971/04/15 (50 y.o. [name], [name] Primary Care Marella Vanderpol: [name] Other Clinician: Referring Ariyan Brisendine: Treating Catherina Pates/Extender: Sammuel Bailiff in Treatment: 24 Vital Signs Time Taken: 15:15 Temperature (F): 98.2 Height (in): 67 Pulse (bpm): 91 Weight (lbs): 346 Respiratory Rate (breaths/min): 20 Body Mass Index (BMI): 54.2 Blood Pressure (mmHg): 132/85 Reference Range: 80 - 120 mg / dl Electronic Signature(s) Signed: 04/09/2021 6:04:36 PM By: Deon Pilling RN, BSN Entered By: Deon Pilling on 04/09/2021 15:17:17

## 2021-04-18 ENCOUNTER — Telehealth: Payer: Self-pay | Admitting: Internal Medicine

## 2021-04-18 ENCOUNTER — Ambulatory Visit: Payer: Self-pay | Admitting: *Deleted

## 2021-04-18 DIAGNOSIS — M546 Pain in thoracic spine: Secondary | ICD-10-CM

## 2021-04-18 MED ORDER — LIDOCAINE 5 % EX PTCH: 1.0000 | MEDICATED_PATCH | CUTANEOUS | 2 refills | Status: DC

## 2021-04-18 MED ORDER — METHOCARBAMOL 500 MG PO TABS: ORAL_TABLET | ORAL | 0 refills | Status: DC

## 2021-04-18 NOTE — Telephone Encounter (Signed)
Pt is calling back checking on the status of med being sent to pharmacy for back pain or sleep. Advised pt that just waiting on response from provider. Pt says she has taken OTC meds and other meds prescribed and not helping. Spoke with Carilyn Goodpasture, RN via teams and she said she is just waiting on a response as well but pt can do virtual visit tomorrow at [facility] with another provider. Pt was scheduled for 1520 with provider at [facility]. Advised pt to call back if needed or if symptoms worsen.

## 2021-04-18 NOTE — Telephone Encounter (Signed)
Copied from [place] [id number]. Topic: Referral - Request for Referral >> Apr 18, 2021 12:28 PM Leward Quan A wrote: Has patient seen PCP for this complaint? Yes.   *If NO, is insurance requiring patient see PCP for this issue before PCP can refer them? Referral for which specialty: Pain management  Preferred provider/office: none choosen Reason for referral: back pain too much to sleep at night

## 2021-04-18 NOTE — Addendum Note (Signed)
Addended by: [name] B on: 04/18/2021 06:05 PM   Modules accepted: Orders

## 2021-04-18 NOTE — Telephone Encounter (Signed)
Per agent: "Patient called in to inquire of Dr [name] about getting a medication to help with back pain and aide in her sleep at night. Per patient she is up just about all nigt because she can not fall asleep with the back pain. Please call Ph#  [phone] "    Chief Complaint: back pain Symptoms: Back pain , unable to sleep at HS Frequency: Onset 2 months ago Pertinent Negatives: Patient denies, pain does not radiate Disposition: [] ED /[] Urgent Care (no appt availability in office) / [x] Appointment(In office/virtual)/ []  [facility]/ [] Home Care/ [] Refused Recommended Disposition  Additional Notes: Requesting referral to pain management clinic. AL[place] requesting "Something stronger for pain so I can sleep, awake all night for many nights with the pain." States prescribed meds not helping.  Please advise   Reason for Disposition  Back pain is a chronic symptom (recurrent or ongoing AND present > 4 weeks)  Answer Assessment - Initial Assessment Questions 1. ONSET: "When did the pain begin?"     HAd for months 2. LOCATION: "Where does it hurt?" (upper, mid or lower back)     Fell 2 months ago. Spasms 3. SEVERITY: "How bad is the pain?"  (e.g., Scale 1-10; mild, moderate, or severe)   - MILD (1-3): doesn't interfere with normal activities    - MODERATE (4-7): interferes with normal activities or awakens from sleep    - SEVERE (8-10): excruciating pain, unable to do any normal activities      VAries 10/10 at times 4. PATTERN: "Is the pain constant?" (e.g., yes, no; constant, intermittent)      Constant 5. RADIATION: "Does the pain shoot into your legs or elsewhere?"     No 6. CAUSE:  "What do you think is causing the back pain?"      FAll 7. BACK OVERUSE:  "Any recent lifting of heavy objects, strenuous work or exercise?"     no 8. MEDICATIONS: "What have you taken so far for the pain?" (e.g., nothing, acetaminophen, NSAIDS)     "Tylenol, gabapentin, muscle relaxer"   Nothing helping 9. NEUROLOGIC SYMPTOMS: "Do you have any weakness, numbness, or problems with bowel/bladder control?"     no 10. OTHER SYMPTOMS: "Do you have any other symptoms?" (e.g., fever, abdominal pain, burning with urination, blood in urine)       no  Protocols used: Back Pain-A-AH

## 2021-04-19 ENCOUNTER — Other Ambulatory Visit: Payer: Self-pay

## 2021-04-19 ENCOUNTER — Telehealth: Payer: Self-pay | Admitting: Nurse Practitioner

## 2021-04-19 ENCOUNTER — Telehealth: Payer: Medicaid Other | Admitting: Nurse Practitioner

## 2021-04-19 MED ORDER — HYDROXYZINE PAMOATE 25 MG PO CAPS: 25.0000 mg | ORAL_CAPSULE | Freq: Every evening | ORAL | 1 refills | Status: DC | PRN

## 2021-04-19 NOTE — Telephone Encounter (Signed)
Contacted pt and left a detailed vm making her aware that provider sent  in a rx for Hydroxyzine at bedtime and if she has any questions or concerns to give [place] a call

## 2021-04-19 NOTE — Telephone Encounter (Signed)
Attempted to call patient for virtual visit. No answer - left voicemail to call back to reschedule

## 2021-04-19 NOTE — Telephone Encounter (Signed)
Contacted pt to go over provider response pt didn't answer lvm also sent pt a MyChart message

## 2021-04-19 NOTE — Telephone Encounter (Signed)
Pt returned call... Pt states she went to the hospital to get xray and they told her that she is needing a written order. Made pt aware that the order is in epic.. Made pt aware that she will need to get xray done and if they are still giving her a hard time to give [place] a call while she is there so that we can speak to someone. Pt also stated she is requesting something for sleep. Pt states she has not been able to sleep for 2 weeks . Will forward to provider

## 2021-04-19 NOTE — Addendum Note (Signed)
Addended by: [name] B on: 04/19/2021 02:39 PM   Modules accepted: Orders

## 2021-04-20 ENCOUNTER — Other Ambulatory Visit: Payer: Self-pay | Admitting: Internal Medicine

## 2021-04-20 MED ORDER — HYDROXYZINE PAMOATE 25 MG PO CAPS: 25.0000 mg | ORAL_CAPSULE | Freq: Every evening | ORAL | 1 refills | Status: DC | PRN

## 2021-04-20 NOTE — Telephone Encounter (Signed)
Copied from [place] [phone]. Topic: Quick Communication - Rx Refill/Question >> Apr 20, 2021 12:46 PM Tessa Lerner A wrote: Medication: hydrOXYzine (VISTARIL) 25 MG capsule [[id number]]   Has the patient contacted their pharmacy? Yes.  The patient has been directed to contact their PCP (Agent: If no, request that the patient contact the pharmacy for the refill. If patient does not wish to contact the pharmacy document the reason why and proceed with request.) (Agent: If yes, when and what did the pharmacy advise?)  Preferred Pharmacy (with phone number or street name): [name] - [place], [address]  Phone:  [phone] Fax:  [phone]    Has the patient been seen for an appointment in the last year OR does the patient have an upcoming appointment? Yes.    Agent: Please be advised that RX refills may take up to 3 business days. We ask that you follow-up with your pharmacy.

## 2021-04-20 NOTE — Telephone Encounter (Signed)
Pt calling again regarding this medication, please advise if it will be refilled.

## 2021-04-20 NOTE — Telephone Encounter (Signed)
1331- pt called and notified med will be filled today. Pt states she is there now and will let them know that she is waiting for it.   [place] called and spoke to [place], [place] about the refill(s) hydroxyzine 25mg  requested. Advised it was sent on 04/19/21 #30/1 refill(s). She states med wasn't received yesterday but was able to give rx over the phone and will start working on filling it for pt. Will call and inform pt.

## 2021-04-26 ENCOUNTER — Other Ambulatory Visit: Payer: Self-pay | Admitting: Physician Assistant

## 2021-04-26 DIAGNOSIS — L8992 Pressure ulcer of unspecified site, stage 2: Secondary | ICD-10-CM

## 2021-04-26 DIAGNOSIS — I10 Essential (primary) hypertension: Secondary | ICD-10-CM

## 2021-04-26 NOTE — Telephone Encounter (Signed)
Requested Prescriptions  Pending Prescriptions Disp Refills   gabapentin (NEURONTIN) 100 MG capsule [Pharmacy Med Name: GABAPENTIN 100 MG CAPSULE] 90 capsule 2    Sig: TAKE 1 CAPSULE BY MOUTH THREE TIMES A DAY AS NEEDED FOR PAN     Neurology: Anticonvulsants - gabapentin Passed - 04/26/2021 12:02 AM      Passed - Valid encounter within last 12 months    Recent Outpatient Visits          1 month ago Iron deficiency anemia due to chronic blood loss   [place] [name], Dalbert Batman, MD   3 months ago Preoperative evaluation to rule out surgical contraindication   [place] [name] B, MD   4 months ago Type 2 diabetes mellitus with hyperglycemia, unspecified whether long term insulin use [facility])   [place], [place], [place]   10 months ago Chronic midline thoracic back pain   [place], Deborah B, MD   1 year ago Establishing care with new doctor, encounter for   [place], MD      Future Appointments            In 5 days Fenton Foy, NP [facility] at [facility]   In 2 months Leonie Man, MD [facility], [facility]            amLODipine ([place]) 5 MG tablet [Pharmacy Med Name: amLODIPine BESYLATE 5 MG TAB] 30 tablet 2    Sig: TAKE 1/2 TABLET BY MOUTH DAILY     Cardiovascular:  Calcium Channel Blockers Failed - 04/26/2021 12:02 AM      Failed - Last BP in normal range    BP Readings from Last 1 Encounters:  03/12/21 (!) 142/64         Passed - Valid encounter within last 6 months    Recent Outpatient Visits          1 month ago Iron deficiency anemia due to chronic blood loss   [place], MD   3 months ago Preoperative evaluation to rule out surgical contraindication   [place], Deborah B, MD   4 months ago Type 2 diabetes mellitus with hyperglycemia, unspecified whether long term insulin use [facility])   [place], [place], [place]   10 months ago Chronic midline thoracic back pain   [place], Deborah B, MD   1 year ago Establishing care with new doctor, encounter for   [place], MD      Future Appointments            In 5 days Fenton Foy, NP Primary Care at [facility]   In 2 months Ellyn Hack, Leonie Green, MD [place] [facility], [facility]

## 2021-04-27 ENCOUNTER — Encounter: Payer: Self-pay | Admitting: Dietician

## 2021-04-27 ENCOUNTER — Encounter: Payer: Self-pay | Admitting: Hematology

## 2021-04-27 ENCOUNTER — Encounter: Payer: Medicaid Other | Attending: Internal Medicine | Admitting: Dietician

## 2021-04-27 ENCOUNTER — Other Ambulatory Visit: Payer: Self-pay

## 2021-04-27 DIAGNOSIS — E119 Type 2 diabetes mellitus without complications: Secondary | ICD-10-CM | POA: Insufficient documentation

## 2021-04-27 NOTE — Patient Instructions (Addendum)
Start by weaning off the coffee at night.  This may be causing you not to sleep and also you are getting high amounts of sugar with the coffee.  Decrease the sugar in the coffee as well.  Add more vegetables  Before you snack, ask, "Am I hungry or eating for another reason?" - play games or watch TV or read  instead.  Consider stopping all screens after 1 am.

## 2021-04-27 NOTE — Progress Notes (Signed)
Medical Nutrition Therapy  Appointment Start time:  [place] End time:  1200  Primary concerns today: Patient states that she has been trying to avoid soda and sweets.  Quit smoking.  Caring for her mother which is stressfully.  Not sleeping. She would like to lose weight.  She states that she did not qualify for weight loss surgery due to high risk.  Cannot walk due to increased shortness of breath.  She wears compression stockings.  She is to use a walker or cane. Referral diagnosis: Type 2 Diabetes with obesity Preferred learning style: no preference indicated Learning readiness: ready   NUTRITION ASSESSMENT   Anthropometrics  62" 345 lbs 04/27/2021  Obese since 50 yo and gained more weight since her father died in August 11, 2003 389 lbs 3 weeks ago States that she lost by stopping eating due to frustration over her weight.  Clinical Medical Hx: Type 2 Diabetes 10-Aug-2000), OSA (just tested and not on C-pap yet), iron deficient anemia, CAD, cellulitis, CHF Medications: see list to include Farxiga, lasix, spironolactone, iron, vitamin B-12, Klor con, vitamin D Labs: 6.5% 11/24/2020 Notable Signs/Symptoms: 165 fasting glucose  Lifestyle & Dietary Hx Patient lives with her mother.  They share shopping and cooking.   She does not drive and takes an [place] or her mother takes her. She is on food stamps.  Sleep: "not sleeping at all"  4 am-6 am - "so tired, can't fall asleep", Sometimes she is scared to sleep for fear of not waking up. Stress / self-care: high.   Current average weekly physical activity: She takes her dogs out 3 times per day but does not walk much due to leg problems.  Takes them to the dog park (10 blocks away) occasionally.  24-Hr Dietary Recall Lactose intolerant Does not use added salt. Trying to eat 1 meal per day First Meal: cornflakes, soy milk, toast, applesauce Snack: OJ Second Meal: none Snack: none Third Meal: 2 slices pepperoni pizza OR homemade soup OR  steak, baked potato, green beans Snack: ice cream, cookies, potato chips Beverages: water, coffee (1 pot per day from 7am-9 pm and  1 pot from 9 pm-12 pm) with 2 containers half and half and splenda or raw sugar  NUTRITION DIAGNOSIS  NB-1.1 Food and nutrition-related knowledge deficit As related to balance of carbohydrates, protein, and fat for weight loss.  As evidenced by diet hx and patient report.   NUTRITION INTERVENTION  Nutrition education (E-1) on the following topics:  Patient mentioned a couple of diets that she has thought about (Weight Watchers and Golo).  Discussed that it may be helpful for extra support, however, I believe that she can lose weight with lifestyle changes that do not cost additional money. Discussed sleep, importance for diabetes control and weight and things that can negatively affect sleep (high caffeine use, menopause, OSA, screen timing) with particular focus on things that she can change Discussed the calorie consumption just with coffee alone (1104 calories from 1 1/2 cups sugar plus 216 calories from the half and half) daily. Mindfulness and options rather than eating when not hungry Reviewed my plate Discussed balanced snacks and portion control Reviewed insulin resistance and benefits of weight loss, activity as able  Handouts Provided Include  Anemia nutrition Therapy from AND Snack sheet My plate ADA book How to Thrive:  A guide for your journey with diabetes Diabetes resource page Heart healthy, consistent carbohydrate nutrition therapy from AND Meal plan card  Learning Style & Readiness  for Change Teaching method utilized: Visual & Auditory  Demonstrated degree of understanding via: Teach Back  Barriers to learning/adherence to lifestyle change: lifelong habits, motivation  Goals Start by weaning off the coffee at night.  This may be causing you not to sleep and also you are getting high amounts of sugar with the coffee.  Decrease the sugar  in the coffee as well.  Add more vegetables  Before you snack, ask, "Am I hungry or eating for another reason?" - play games or watch TV or read  instead.  Consider stopping all screens after 1 am.   MONITORING & EVALUATION Dietary intake, weekly physical activity, and label reading in 2 months.  Next Steps  Patient is to call for questions.

## 2021-05-01 ENCOUNTER — Encounter: Payer: Self-pay | Admitting: Nurse Practitioner

## 2021-05-01 ENCOUNTER — Telehealth (INDEPENDENT_AMBULATORY_CARE_PROVIDER_SITE_OTHER): Payer: Medicaid Other | Admitting: Nurse Practitioner

## 2021-05-01 ENCOUNTER — Encounter: Payer: Self-pay | Admitting: Hematology

## 2021-05-01 ENCOUNTER — Other Ambulatory Visit: Payer: Self-pay

## 2021-05-01 DIAGNOSIS — M546 Pain in thoracic spine: Secondary | ICD-10-CM | POA: Diagnosis not present

## 2021-05-01 DIAGNOSIS — I5032 Chronic diastolic (congestive) heart failure: Secondary | ICD-10-CM | POA: Diagnosis not present

## 2021-05-01 DIAGNOSIS — G8929 Other chronic pain: Secondary | ICD-10-CM | POA: Diagnosis not present

## 2021-05-01 DIAGNOSIS — I10 Essential (primary) hypertension: Secondary | ICD-10-CM | POA: Diagnosis not present

## 2021-05-01 MED ORDER — OMEPRAZOLE 20 MG PO CPDR: 20.0000 mg | DELAYED_RELEASE_CAPSULE | Freq: Every day | ORAL | 0 refills | Status: DC | PRN

## 2021-05-01 MED ORDER — FUROSEMIDE 80 MG PO TABS: 80.0000 mg | ORAL_TABLET | Freq: Every day | ORAL | 4 refills | Status: DC

## 2021-05-01 NOTE — Progress Notes (Signed)
Virtual Visit via Video Note  I connected with [name] on 05/01/21 at  1:00 PM EST by a video enabled telemedicine application and verified that I am speaking with the correct person using two identifiers.  Location: Patient: home Provider: office   I discussed the limitations of evaluation and management by telemedicine and the availability of in person appointments. The patient expressed understanding and agreed to proceed.  History of Present Illness:  Patient presents today for follow-up on back pain through video visit.  She states that she fell in July 2021.  She states that she has been having chronic back pain since that time.  She has been having issues with pain to her mid back region.  She states that Dr. [name] sent in lidocaine patches for her and this has significantly helped.  She has also been prescribed muscle relaxers which she states are effective.  She states that she has not been able to get her x-ray completed that was ordered by Dr. [name].  We will reorder this x-ray for her today.  Patient will have the x-[facility] done at [facility]. Denies f/c/s, n/v/d, hemoptysis, PND, chest pain or edema.     Observations/Objective:  Vitals with BMI 04/27/2021 04/05/2021 03/12/2021  Height 5\' 2"  5\' 2"  -  Weight 345 lbs 389 lbs -  BMI 14.78 29.56 -  Systolic - - 213  Diastolic - - 64  Pulse - - 86      Assessment and Plan:  Patient Instructions  Back pain:  Continue lidocaine patches as ordered by Dr. [name]  Stay active  May alternate heat and ice  Will reorder T-Spine xray - pending results may need orthopedic referral or pain management referral  Follow up:  Follow up with Dr. [name] in 1 month or sooner if needed  Chronic Back Pain When back pain lasts longer than 3 months, it is called chronic back pain. Pain may get worse at certain times (flare-ups). There are things you can do at home to manage your pain. Follow these  instructions at home: Pay attention to any changes in your symptoms. Take these actions to help with your pain: Managing pain and stiffness   If told, put ice on the painful area. Your doctor may tell you to use ice for 24-48 hours after the flare-up starts. To do this: Put ice in a plastic bag. Place a towel between your skin and the bag. Leave the ice on for 20 minutes, 2-3 times a day. If told, put heat on the painful area. Do this as often as told by your doctor. Use the heat source that your doctor recommends, such as a moist heat pack or a heating pad. Place a towel between your skin and the heat source. Leave the heat on for 20-30 minutes. Take off the heat if your skin turns bright red. This is especially important if you are unable to feel pain, heat, or cold. You may have a greater risk of getting burned. Soak in a warm bath. This can help relieve pain. Activity  Avoid bending and other activities that make pain worse. When standing: Keep your upper back and neck straight. Keep your shoulders pulled back. Avoid slouching. When sitting: Keep your back straight. Relax your shoulders. Do not round your shoulders or pull them backward. Do not sit or stand in one place for long periods of time. Take short rest breaks during the day. Lying down or standing is usually better than sitting.  Resting can help relieve pain. When sitting or lying down for a long time, do some mild activity or stretching. This will help to prevent stiffness and pain. Get regular exercise. Ask your doctor what activities are safe for you. Do not lift anything that is heavier than 10 lb (4.5 kg) or the limit that you are told, until your doctor says that it is safe. To prevent injury when you lift things: Bend your knees. Keep the weight close to your body. Avoid twisting. Sleep on a firm mattress. Try lying on your side with your knees slightly bent. If you lie on your back, put a pillow under your  knees. Medicines Treatment may include medicines for pain and swelling taken by mouth or put on the skin, prescription pain medicine, or muscle relaxants. Take over-the-counter and prescription medicines only as told by your doctor. Ask your doctor if the medicine prescribed to you: Requires you to avoid driving or using machinery. Can cause trouble pooping (constipation). You may need to take these actions to prevent or treat trouble pooping: Drink enough fluid to keep your pee (urine) pale yellow. Take over-the-counter or prescription medicines. Eat foods that are high in fiber. These include beans, whole grains, and fresh fruits and vegetables. Limit foods that are high in fat and sugars. These include fried or sweet foods. General instructions Do not use any products that contain nicotine or tobacco, such as cigarettes, e-cigarettes, and chewing tobacco. If you need help quitting, ask your doctor. Keep all follow-up visits as told by your doctor. This is important. Contact a doctor if: Your pain does not get better with rest or medicine. Your pain gets worse, or you have new pain. You have a high fever. You lose weight very quickly. You have trouble doing your normal activities. Get help right away if: One or both of your legs or feet feel weak. One or both of your legs or feet lose feeling (have numbness). You have trouble controlling when you poop (have a bowel movement) or pee (urinate). You have bad back pain and: You feel like you may vomit (nauseous), or you vomit. You have pain in your belly (abdomen). You have shortness of breath. You faint. Summary When back pain lasts longer than 3 months, it is called chronic back pain. Pain may get worse at certain times (flare-ups). Use ice and heat as told by your doctor. Your doctor may tell you to use ice after flare-ups. This information is not intended to replace advice given to you by your health care provider. Make sure you  discuss any questions you have with your health care provider. Document Revised: 05/26/2019 Document Reviewed: 05/26/2019 Elsevier Patient Education  2022 [employer].      I discussed the assessment and treatment plan with the patient. The patient was provided an opportunity to ask questions and all were answered. The patient agreed with the plan and demonstrated an understanding of the instructions.   The patient was advised to call back or seek an in-person evaluation if the symptoms worsen or if the condition fails to improve as anticipated.  I provided 24 minutes of non-face-to-face time during this encounter.   Fenton Foy, NP

## 2021-05-01 NOTE — Patient Instructions (Addendum)
Back pain:  Continue lidocaine patches as ordered by Dr. [name]  Stay active  May alternate heat and ice  Will reorder T-Spine xray - pending results may need orthopedic referral or pain management referral  Follow up:  Follow up with Dr. [name] in 1 month or sooner if needed  Chronic Back Pain When back pain lasts longer than 3 months, it is called chronic back pain. Pain may get worse at certain times (flare-ups). There are things you can do at home to manage your pain. Follow these instructions at home: Pay attention to any changes in your symptoms. Take these actions to help with your pain: Managing pain and stiffness   If told, put ice on the painful area. Your doctor may tell you to use ice for 24-48 hours after the flare-up starts. To do this: Put ice in a plastic bag. Place a towel between your skin and the bag. Leave the ice on for 20 minutes, 2-3 times a day. If told, put heat on the painful area. Do this as often as told by your doctor. Use the heat source that your doctor recommends, such as a moist heat pack or a heating pad. Place a towel between your skin and the heat source. Leave the heat on for 20-30 minutes. Take off the heat if your skin turns bright red. This is especially important if you are unable to feel pain, heat, or cold. You may have a greater risk of getting burned. Soak in a warm bath. This can help relieve pain. Activity  Avoid bending and other activities that make pain worse. When standing: Keep your upper back and neck straight. Keep your shoulders pulled back. Avoid slouching. When sitting: Keep your back straight. Relax your shoulders. Do not round your shoulders or pull them backward. Do not sit or stand in one place for long periods of time. Take short rest breaks during the day. Lying down or standing is usually better than sitting. Resting can help relieve pain. When sitting or lying down for a long time, do some mild activity or  stretching. This will help to prevent stiffness and pain. Get regular exercise. Ask your doctor what activities are safe for you. Do not lift anything that is heavier than 10 lb (4.5 kg) or the limit that you are told, until your doctor says that it is safe. To prevent injury when you lift things: Bend your knees. Keep the weight close to your body. Avoid twisting. Sleep on a firm mattress. Try lying on your side with your knees slightly bent. If you lie on your back, put a pillow under your knees. Medicines Treatment may include medicines for pain and swelling taken by mouth or put on the skin, prescription pain medicine, or muscle relaxants. Take over-the-counter and prescription medicines only as told by your doctor. Ask your doctor if the medicine prescribed to you: Requires you to avoid driving or using machinery. Can cause trouble pooping (constipation). You may need to take these actions to prevent or treat trouble pooping: Drink enough fluid to keep your pee (urine) pale yellow. Take over-the-counter or prescription medicines. Eat foods that are high in fiber. These include beans, whole grains, and fresh fruits and vegetables. Limit foods that are high in fat and sugars. These include fried or sweet foods. General instructions Do not use any products that contain nicotine or tobacco, such as cigarettes, e-cigarettes, and chewing tobacco. If you need help quitting, ask your doctor. Keep all follow-up visits as  told by your doctor. This is important. Contact a doctor if: Your pain does not get better with rest or medicine. Your pain gets worse, or you have new pain. You have a high fever. You lose weight very quickly. You have trouble doing your normal activities. Get help right away if: One or both of your legs or feet feel weak. One or both of your legs or feet lose feeling (have numbness). You have trouble controlling when you poop (have a bowel movement) or pee (urinate). You  have bad back pain and: You feel like you may vomit (nauseous), or you vomit. You have pain in your belly (abdomen). You have shortness of breath. You faint. Summary When back pain lasts longer than 3 months, it is called chronic back pain. Pain may get worse at certain times (flare-ups). Use ice and heat as told by your doctor. Your doctor may tell you to use ice after flare-ups. This information is not intended to replace advice given to you by your health care provider. Make sure you discuss any questions you have with your health care provider. Document Revised: 05/26/2019 Document Reviewed: 05/26/2019 Elsevier Patient Education  2022 [employer].

## 2021-05-16 ENCOUNTER — Other Ambulatory Visit: Payer: Self-pay | Admitting: Internal Medicine

## 2021-05-16 DIAGNOSIS — Z86718 Personal history of other venous thrombosis and embolism: Secondary | ICD-10-CM

## 2021-05-16 NOTE — Telephone Encounter (Signed)
Requested medications are due for refill today.  yes  Requested medications are on the active medications list.  yes  Last refill. 11/29/2020  Future visit scheduled.   no  Notes to clinic.  Failed protocol d/t abnormal labs.    Requested Prescriptions  Pending Prescriptions Disp Refills   ELIQUIS 5 MG TABS tablet [Pharmacy Med Name: ELIQUIS 5 MG TABLET] 60 tablet 3    Sig: TAKE ONE TABLET BY MOUTH TWICE A DAY     Hematology:  Anticoagulants Failed - 05/16/2021  6:21 AM      Failed - HGB in normal range and within 360 days    Hemoglobin  Date Value Ref Range Status  03/01/2021 10.7 (L) 11.1 - 15.9 g/dL Final          Passed - PLT in normal range and within 360 days    Platelets  Date Value Ref Range Status  03/01/2021 177 150 - 450 x10E3/uL Final          Passed - HCT in normal range and within 360 days    Hematocrit  Date Value Ref Range Status  03/01/2021 36.3 34.0 - 46.6 % Final          Passed - Cr in normal range and within 360 days    Creatinine  Date Value Ref Range Status  02/22/2021 0.98 0.44 - 1.00 mg/dL Final          Passed - Valid encounter within last 12 months    Recent Outpatient Visits           2 months ago Iron deficiency anemia due to chronic blood loss   [place], MD   4 months ago Preoperative evaluation to rule out surgical contraindication   [place], Deborah B, MD   5 months ago Type 2 diabetes mellitus with hyperglycemia, unspecified whether long term insulin use [facility])   [place], [place], [place]   11 months ago Chronic midline thoracic back pain   [place] Ladell [place], MD   1 year ago Establishing care with new doctor, encounter for   [place], MD       Future Appointments             In 1 month Ellyn Hack  Leonie Green, MD [place] [facility], [facility]

## 2021-05-18 HISTORY — PX: TRANSTHORACIC ECHOCARDIOGRAM: SHX275

## 2021-05-25 ENCOUNTER — Encounter (HOSPITAL_COMMUNITY): Payer: Self-pay | Admitting: Emergency Medicine

## 2021-05-25 ENCOUNTER — Inpatient Hospital Stay (HOSPITAL_COMMUNITY)
Admission: EM | Admit: 2021-05-25 | Discharge: 2021-06-01 | DRG: 291 | Disposition: A | Discharge status: Home / Self Care | Payer: Medicaid Other | Attending: Internal Medicine | Admitting: Internal Medicine

## 2021-05-25 ENCOUNTER — Other Ambulatory Visit: Payer: Self-pay

## 2021-05-25 ENCOUNTER — Inpatient Hospital Stay: Payer: Medicaid Other

## 2021-05-25 ENCOUNTER — Emergency Department (HOSPITAL_COMMUNITY): Payer: Medicaid Other

## 2021-05-25 DIAGNOSIS — Z20822 Contact with and (suspected) exposure to covid-19: Secondary | ICD-10-CM | POA: Diagnosis not present

## 2021-05-25 DIAGNOSIS — Z95828 Presence of other vascular implants and grafts: Secondary | ICD-10-CM

## 2021-05-25 DIAGNOSIS — I5032 Chronic diastolic (congestive) heart failure: Secondary | ICD-10-CM

## 2021-05-25 DIAGNOSIS — D500 Iron deficiency anemia secondary to blood loss (chronic): Secondary | ICD-10-CM | POA: Diagnosis present

## 2021-05-25 DIAGNOSIS — F32A Depression, unspecified: Secondary | ICD-10-CM

## 2021-05-25 DIAGNOSIS — J4540 Moderate persistent asthma, uncomplicated: Secondary | ICD-10-CM | POA: Diagnosis present

## 2021-05-25 DIAGNOSIS — I110 Hypertensive heart disease with heart failure: Secondary | ICD-10-CM | POA: Diagnosis not present

## 2021-05-25 DIAGNOSIS — E873 Alkalosis: Secondary | ICD-10-CM | POA: Diagnosis not present

## 2021-05-25 DIAGNOSIS — E119 Type 2 diabetes mellitus without complications: Secondary | ICD-10-CM | POA: Diagnosis not present

## 2021-05-25 DIAGNOSIS — Z888 Allergy status to other drugs, medicaments and biological substances status: Secondary | ICD-10-CM | POA: Diagnosis not present

## 2021-05-25 DIAGNOSIS — E6609 Other obesity due to excess calories: Secondary | ICD-10-CM

## 2021-05-25 DIAGNOSIS — Z6841 Body Mass Index (BMI) 40.0 and over, adult: Secondary | ICD-10-CM | POA: Diagnosis not present

## 2021-05-25 DIAGNOSIS — Z886 Allergy status to analgesic agent status: Secondary | ICD-10-CM

## 2021-05-25 DIAGNOSIS — R0602 Shortness of breath: Secondary | ICD-10-CM | POA: Diagnosis not present

## 2021-05-25 DIAGNOSIS — Z452 Encounter for adjustment and management of vascular access device: Secondary | ICD-10-CM

## 2021-05-25 DIAGNOSIS — Z881 Allergy status to other antibiotic agents status: Secondary | ICD-10-CM | POA: Diagnosis not present

## 2021-05-25 DIAGNOSIS — Z7901 Long term (current) use of anticoagulants: Secondary | ICD-10-CM | POA: Insufficient documentation

## 2021-05-25 DIAGNOSIS — I509 Heart failure, unspecified: Secondary | ICD-10-CM | POA: Diagnosis not present

## 2021-05-25 DIAGNOSIS — Z825 Family history of asthma and other chronic lower respiratory diseases: Secondary | ICD-10-CM

## 2021-05-25 DIAGNOSIS — Z7984 Long term (current) use of oral hypoglycemic drugs: Secondary | ICD-10-CM | POA: Diagnosis not present

## 2021-05-25 DIAGNOSIS — I82562 Chronic embolism and thrombosis of left calf muscular vein: Secondary | ICD-10-CM | POA: Diagnosis not present

## 2021-05-25 DIAGNOSIS — Z86711 Personal history of pulmonary embolism: Secondary | ICD-10-CM

## 2021-05-25 DIAGNOSIS — Z7951 Long term (current) use of inhaled steroids: Secondary | ICD-10-CM | POA: Diagnosis not present

## 2021-05-25 DIAGNOSIS — R600 Localized edema: Secondary | ICD-10-CM | POA: Diagnosis not present

## 2021-05-25 DIAGNOSIS — G4733 Obstructive sleep apnea (adult) (pediatric): Secondary | ICD-10-CM | POA: Diagnosis present

## 2021-05-25 DIAGNOSIS — Z885 Allergy status to narcotic agent status: Secondary | ICD-10-CM

## 2021-05-25 DIAGNOSIS — Y711 Therapeutic (nonsurgical) and rehabilitative cardiovascular devices associated with adverse incidents: Secondary | ICD-10-CM | POA: Diagnosis present

## 2021-05-25 DIAGNOSIS — F411 Generalized anxiety disorder: Secondary | ICD-10-CM | POA: Diagnosis not present

## 2021-05-25 DIAGNOSIS — Z87891 Personal history of nicotine dependence: Secondary | ICD-10-CM

## 2021-05-25 DIAGNOSIS — I10 Essential (primary) hypertension: Secondary | ICD-10-CM

## 2021-05-25 DIAGNOSIS — T82594A Other mechanical complication of infusion catheter, initial encounter: Secondary | ICD-10-CM | POA: Diagnosis not present

## 2021-05-25 DIAGNOSIS — I2510 Atherosclerotic heart disease of native coronary artery without angina pectoris: Secondary | ICD-10-CM | POA: Diagnosis present

## 2021-05-25 DIAGNOSIS — Z79899 Other long term (current) drug therapy: Secondary | ICD-10-CM

## 2021-05-25 DIAGNOSIS — I517 Cardiomegaly: Secondary | ICD-10-CM | POA: Diagnosis not present

## 2021-05-25 DIAGNOSIS — I252 Old myocardial infarction: Secondary | ICD-10-CM

## 2021-05-25 DIAGNOSIS — J811 Chronic pulmonary edema: Secondary | ICD-10-CM | POA: Diagnosis not present

## 2021-05-25 DIAGNOSIS — Z9981 Dependence on supplemental oxygen: Secondary | ICD-10-CM

## 2021-05-25 DIAGNOSIS — Z833 Family history of diabetes mellitus: Secondary | ICD-10-CM

## 2021-05-25 DIAGNOSIS — J9621 Acute and chronic respiratory failure with hypoxia: Secondary | ICD-10-CM | POA: Diagnosis not present

## 2021-05-25 DIAGNOSIS — Z86718 Personal history of other venous thrombosis and embolism: Secondary | ICD-10-CM | POA: Diagnosis not present

## 2021-05-25 DIAGNOSIS — I5031 Acute diastolic (congestive) heart failure: Secondary | ICD-10-CM | POA: Diagnosis not present

## 2021-05-25 DIAGNOSIS — M1712 Unilateral primary osteoarthritis, left knee: Secondary | ICD-10-CM | POA: Diagnosis present

## 2021-05-25 DIAGNOSIS — I5033 Acute on chronic diastolic (congestive) heart failure: Secondary | ICD-10-CM | POA: Diagnosis not present

## 2021-05-25 DIAGNOSIS — Z91041 Radiographic dye allergy status: Secondary | ICD-10-CM

## 2021-05-25 DIAGNOSIS — R609 Edema, unspecified: Secondary | ICD-10-CM | POA: Diagnosis not present

## 2021-05-25 LAB — COMPREHENSIVE METABOLIC PANEL
ALT: 12 U/L (ref 0–44)
AST: 15 U/L (ref 15–41)
Albumin: 4 g/dL (ref 3.5–5.0)
Alkaline Phosphatase: 63 U/L (ref 38–126)
Anion gap: 8 (ref 5–15)
BUN: 12 mg/dL (ref 6–20)
CO2: 29 mmol/L (ref 22–32)
Calcium: 9 mg/dL (ref 8.9–10.3)
Chloride: 96 mmol/L — ABNORMAL LOW (ref 98–111)
Creatinine, Ser: 0.86 mg/dL (ref 0.44–1.00)
GFR, Estimated: >60 mL/min (ref >60)
Glucose, Bld: 132 mg/dL — ABNORMAL HIGH (ref 70–99)
Potassium: 3.9 mmol/L (ref 3.5–5.1)
Sodium: 133 mmol/L — ABNORMAL LOW (ref 135–145)
Total Bilirubin: 1.2 mg/dL (ref 0.3–1.2)
Total Protein: 8.1 g/dL (ref 6.5–8.1)

## 2021-05-25 LAB — CBC WITH DIFFERENTIAL/PLATELET
Abs Immature Granulocytes: 0.02 10*3/uL (ref 0.00–0.07)
Basophils Absolute: 0 10*3/uL (ref 0.0–0.1)
Basophils Relative: 0 %
Eosinophils Absolute: 0 10*3/uL (ref 0.0–0.5)
Eosinophils Relative: 0 %
HCT: 37.8 % (ref 36.0–46.0)
Hemoglobin: 10.9 g/dL — ABNORMAL LOW (ref 12.0–15.0)
Immature Granulocytes: 0 %
Lymphocytes Relative: 3 %
Lymphs Abs: 0.2 10*3/uL — ABNORMAL LOW (ref 0.7–4.0)
MCH: 26.8 pg (ref 26.0–34.0)
MCHC: 28.8 g/dL — ABNORMAL LOW (ref 30.0–36.0)
MCV: 93.1 fL (ref 80.0–100.0)
Monocytes Absolute: 0.3 10*3/uL (ref 0.1–1.0)
Monocytes Relative: 4 %
Neutro Abs: 8.1 10*3/uL — ABNORMAL HIGH (ref 1.7–7.7)
Neutrophils Relative %: 93 %
Platelets: 140 10*3/uL — ABNORMAL LOW (ref 150–400)
RBC: 4.06 MIL/uL (ref 3.87–5.11)
RDW: 15.9 % — ABNORMAL HIGH (ref 11.5–15.5)
WBC: 8.7 10*3/uL (ref 4.0–10.5)
nRBC: 0 % (ref 0.0–0.2)

## 2021-05-25 LAB — CBG MONITORING, ED
Glucose-Capillary: 77 mg/dL (ref 70–99)
Glucose-Capillary: 98 mg/dL (ref 70–99)
Glucose-Capillary: 132 mg/dL — ABNORMAL HIGH (ref 70–99)

## 2021-05-25 LAB — BRAIN NATRIURETIC PEPTIDE: B Natriuretic Peptide: 76.6 pg/mL (ref 0.0–100.0)

## 2021-05-25 LAB — TROPONIN I (HIGH SENSITIVITY): Troponin I (High Sensitivity): 5 ng/L (ref <18)

## 2021-05-25 MED ORDER — APIXABAN 5 MG PO TABS
5.0000 mg | ORAL_TABLET | Freq: Two times a day (BID) | ORAL | Status: DC
  Administered 2021-05-25 – 2021-06-01 (×14): 5 mg via ORAL
  Filled 2021-05-25 (×14): qty 1

## 2021-05-25 MED ORDER — FUROSEMIDE 10 MG/ML IJ SOLN
80.0000 mg | Freq: Once | INTRAMUSCULAR | Status: AC
  Administered 2021-05-25: 80 mg via INTRAVENOUS
  Filled 2021-05-25 (×2): qty 8

## 2021-05-25 MED ORDER — SODIUM CHLORIDE 0.9% FLUSH
10.0000 mL | INTRAVENOUS | Status: AC | PRN
  Administered 2021-05-25: 10 mL

## 2021-05-25 MED ORDER — HYDRALAZINE HCL 25 MG PO TABS
25.0000 mg | ORAL_TABLET | Freq: Once | ORAL | Status: AC
  Administered 2021-05-25: 25 mg via ORAL
  Filled 2021-05-25: qty 1

## 2021-05-25 MED ORDER — CARVEDILOL 25 MG PO TABS
25.0000 mg | ORAL_TABLET | Freq: Two times a day (BID) | ORAL | Status: DC
  Administered 2021-05-25 – 2021-06-01 (×14): 25 mg via ORAL
  Filled 2021-05-25: qty 2
  Filled 2021-05-25 (×3): qty 1
  Filled 2021-05-25: qty 2
  Filled 2021-05-25 (×9): qty 1

## 2021-05-25 MED ORDER — MORPHINE SULFATE (PF) 4 MG/ML IV SOLN
4.0000 mg | Freq: Once | INTRAVENOUS | Status: AC
  Administered 2021-05-25: 4 mg via INTRAVENOUS
  Filled 2021-05-25: qty 1

## 2021-05-25 MED ORDER — HEPARIN SOD (PORK) LOCK FLUSH 100 UNIT/ML IV SOLN
500.0000 [IU] | INTRAVENOUS | Status: AC | PRN
  Administered 2021-05-25: 500 [IU]

## 2021-05-25 NOTE — ED Provider Notes (Signed)
[place] DEPT Provider Note   CSN: [id number] Arrival date & time: 05/25/21  1604     History  CC: Shortness of breath   [name] is a 51 y.o. female with history of iron deficiency anemia, history of hypertension, history of congestive heart failure on Lasix 80 mg daily, also on Eliquis for lower extremity DVT, presenting to the emergency department with complaint of generalized shortness of breath and lower extremity edema.  The patient feels that this may be related to her heart failure.  She has noted gradually worsening edema in her lower legs and dyspnea on exertion for the past several days.  She did come to the hospital today for iron infusion, and then came here for further evaluation.  She has been compliant with all of her medications including her Lasix.  She has not taken her evening Coreg or Eliquis yet.  She was nasal cannula at baseline, 2 L  Per my review of her external records, her last echocardiogram was in July 2022, showing an EF of 60 to 10%, grade 1 diastolic dysfunction.  She was most recently evaluated by cardiologist Dr. Ellyn Hack in October 2022, per his note who advised that the patient continue, pression stockings, foot elevations, diuretics.  She has a chronic IVC filter.  She is on long-term anticoagulation for history of DVT and questionable PE in the past.  HPI     Home Medications Prior to Admission medications   Medication Sig Start Date End Date Taking? Authorizing Provider  Accu-Chek Softclix Lancets lancets Use as instructed 11/29/20   Argentina Donovan, PA-C  acetaminophen (TYLENOL) 500 MG tablet Take 1 tablet (500 mg total) by mouth every 6 (six) hours as needed. 04/19/20   Ladell Pier, MD  albuterol [facility] HFA) [phone] Base) MCG/ACT inhaler INHALE TWO PUFFS BY MOUTH EVERY 6 HOURS AS NEEDED FOR WHEEZING OR SHORTNESS OF BREATH 10/05/20   Ladell Pier, MD  amLODipine (NORVASC) 5 MG tablet  TAKE 1/2 TABLET BY MOUTH DAILY 04/26/21   Ladell Pier, MD  atorvastatin (LIPITOR) 40 MG tablet Take 1 tablet (40 mg total) by mouth daily. 11/29/20   Argentina Donovan, PA-C  Blood Glucose Calibration (ACCU-CHEK GUIDE CONTROL) LIQD 1 each by In Vitro route daily. 12/13/20   Argentina Donovan, PA-C  Blood Glucose Monitoring Suppl (ACCU-CHEK GUIDE) w/Device KIT 1 each by Does not apply route 2 (two) times daily. 11/29/20   Argentina Donovan, PA-C  Carboxymethylcellulose Sodium (EYE DROPS OP) Place 1 drop into both eyes daily as needed (dry eyes).    [provider]  carvedilol (COREG) 25 MG tablet TAKE ONE TABLET BY MOUTH TWICE A DAY WITH MEALS 01/04/21   Ladell Pier, MD  cholecalciferol (VITAMIN D3) 25 MCG (1000 UNIT) tablet Take 1 tablet (1,000 Units total) by mouth daily. 04/07/20   Ladell Pier, MD  dapagliflozin propanediol (FARXIGA) 10 MG TABS tablet Take 1 tablet (10 mg total) by mouth daily. 12/29/20   Ladell Pier, MD  doxycycline (VIBRA-TABS) 100 MG tablet Take 1 tablet (100 mg total) by mouth 2 (two) times daily. 11/29/20   Argentina Donovan, PA-C  DULERA 200-5 MCG/ACT AERO INHALE 2 PUFFS INTO THE LUNGS TWO TIMES A DAY 03/11/21   Ladell Pier, MD  ELIQUIS 5 MG TABS tablet TAKE ONE TABLET BY MOUTH TWICE A DAY 05/16/21   Ladell Pier, MD  ferrous sulfate 325 (65 FE) MG tablet Take  1 tablet (325 mg total) by mouth daily with breakfast. 03/01/21   Ladell Pier, MD  furosemide (LASIX) 80 MG tablet Take 1 tablet (80 mg total) by mouth daily. 05/01/21   Fenton Foy, NP  gabapentin (NEURONTIN) 100 MG capsule TAKE 1 CAPSULE BY MOUTH THREE TIMES A DAY AS NEEDED FOR PAN 04/26/21   Ladell Pier, MD  glucose blood test strip Use as instructed 11/29/20   Argentina Donovan, PA-C  hydrocortisone cream 1 % Apply 1 application topically daily as needed for itching.    [provider]  hydrOXYzine (VISTARIL) 25 MG capsule Take 1 capsule (25 mg total) by  mouth at bedtime as needed. 04/20/21   Ladell Pier, MD  KLOR-CON M20 20 MEQ tablet TAKE ONE TABLET BY MOUTH DAILY 12/06/20   Ladell Pier, MD  lidocaine (LIDODERM) 5 % Place 1 patch onto the skin daily. Leave on for 12 hrs each day then remove. 04/18/21   Ladell Pier, MD  methocarbamol (ROBAXIN) 500 MG tablet TAKE ONE TABLET BY MOUTH DAILY AS NEEDED FOR MUSCLE SPASMS 04/18/21   Ladell Pier, MD  nitroGLYCERIN (NITROSTAT) 0.4 MG SL tablet Place 1 tablet (0.4 mg total) under the tongue every 5 (five) minutes as needed for chest pain. 11/27/20   Aline August, MD  omeprazole (PRILOSEC) 20 MG capsule Take 1 capsule (20 mg total) by mouth daily as needed (acid reflux). 05/01/21 05/31/21  Fenton Foy, NP  PARoxetine (PAXIL) 10 MG tablet TAKE ONE TABLET BY MOUTH DAILY 04/03/21   Ladell Pier, MD  spironolactone (ALDACTONE) 25 MG tablet Take 1 tablet (25 mg total) by mouth daily. 01/04/21   Ladell Pier, MD  valsartan (DIOVAN) 160 MG tablet Take 1 tablet (160 mg total) by mouth daily. 02/14/21   Leonie Man, MD  vitamin B-12 (CYANOCOBALAMIN) 1000 MCG tablet Take 1,000 mcg by mouth daily.    [provider]      Allergies    Ace inhibitors, Aspirin, Hydromorphone, Vancomycin, Contrast media [iodinated contrast media], and Dilaudid [hydromorphone hcl]    Review of Systems   Review of Systems  Physical Exam Updated Vital Signs BP (!) 168/105    Pulse 97    Temp 98.7 F (37.1 C) (Oral)    Resp 19    Ht 5' 2"  (1.575 m)    Wt (!) 147 kg    SpO2 100%    BMI 59.26 kg/m  Physical Exam Constitutional:      General: She is not in acute distress. HENT:     Head: Normocephalic and atraumatic.  Eyes:     Conjunctiva/sclera: Conjunctivae normal.     Pupils: Pupils are equal, round, and reactive to light.  Cardiovascular:     Rate and Rhythm: Normal rate and regular rhythm.  Pulmonary:     Effort: Pulmonary effort is normal. No respiratory distress.      Comments: Patient remains on baseline 2 L nasal cannula Abdominal:     General: There is no distension.     Tenderness: There is no abdominal tenderness.  Musculoskeletal:     Comments: Bilateral symmetrical lymphedema of the lower extremities  Skin:    General: Skin is warm and dry.  Neurological:     General: No focal deficit present.     Mental Status: She is alert. Mental status is at baseline.  Psychiatric:        Mood and Affect: Mood normal.  Behavior: Behavior normal.    ED Results / Procedures / Treatments   Labs (all labs ordered are listed, but only abnormal results are displayed) Labs Reviewed  CBC WITH DIFFERENTIAL/PLATELET - Abnormal; Notable for the following components:      Result Value   Hemoglobin 10.9 (*)    MCHC 28.8 (*)    RDW 15.9 (*)    Platelets 140 (*)    Neutro Abs 8.1 (*)    Lymphs Abs 0.2 (*)    All other components within normal limits  COMPREHENSIVE METABOLIC PANEL - Abnormal; Notable for the following components:   Sodium 133 (*)    Chloride 96 (*)    Glucose, Bld 132 (*)    All other components within normal limits  CBG MONITORING, ED - Abnormal; Notable for the following components:   Glucose-Capillary 132 (*)    All other components within normal limits  RESP PANEL BY RT-PCR (FLU A&B, COVID) [id number]  BRAIN NATRIURETIC PEPTIDE  CBG MONITORING, ED  CBG MONITORING, ED  TROPONIN I (HIGH SENSITIVITY)  TROPONIN I (HIGH SENSITIVITY)    EKG EKG Interpretation  Date/Time:  Friday May 25 2021 17:11:51 EST Ventricular Rate:  83 PR Interval:  150 QRS Duration: 114 QT Interval:  402 QTC Calculation: 473 R Axis:   27 Text Interpretation: Sinus rhythm Borderline intraventricular conduction delay Low voltage, precordial leads Borderline repolarization abnormality similar to May 2022 Confirmed by Sherwood Gambler [phone]) on 05/25/2021 5:20:14 PM  Radiology DG Chest 2 View  Result Date: 05/25/2021 CLINICAL DATA:  Short of breath  EXAM: CHEST - 2 VIEW COMPARISON:  Chest x-ray 11/22/2020 FINDINGS: Similar-appearing Port-A-Cath with tip overlying the expected region of the superior cavoatrial junction. The heart and mediastinal contours are unchanged. Prominent azygous vein again noted along the right mediastinal silhouette. Increased hilar vascular prominence. No focal consolidation. No pulmonary edema. No pleural effusion. No pneumothorax. No acute osseous abnormality. IMPRESSION: 1. Pulmonary venous congestion. 2.  Aortic Atherosclerosis (ICD10-I70.0). Electronically Signed   By: Iven Finn M.D.   On: 05/25/2021 17:30    Procedures .Critical Care Performed by: Wyvonnia Dusky, MD Authorized by: Wyvonnia Dusky, MD   Critical care provider statement:    Critical care time (minutes):  45   Critical care time was exclusive of:  Separately billable procedures and treating other patients   Critical care was necessary to treat or prevent imminent or life-threatening deterioration of the following conditions:  Cardiac failure   Critical care was time spent personally by me on the following activities:  Ordering and performing treatments and interventions, ordering and review of laboratory studies, ordering and review of radiographic studies, pulse oximetry, review of old charts, examination of patient and evaluation of patient's response to treatment   Care discussed with: admitting provider   Comments:     IV diuresis for tachypnea, CHF exacerbation; reassessment of breathing status    Medications Ordered in ED Medications  apixaban (ELIQUIS) tablet 5 mg (5 mg Oral Given 05/25/21 2300)  carvedilol (COREG) tablet 25 mg (25 mg Oral Given 05/25/21 2233)  furosemide (LASIX) injection 80 mg (80 mg Intravenous Given 05/25/21 2248)  morphine 4 MG/ML injection 4 mg (4 mg Intravenous Given 05/25/21 2239)  hydrALAZINE (APRESOLINE) tablet 25 mg (25 mg Oral Given by Other 05/25/21 2338)    ED Course/ Medical Decision Making/  A&P Clinical Course as of 05/26/21 0004  Fri May 25, 2021  2318 BP 150/100 mmhg in the room, she has produced 500  cc urine but remains dyspneic; feels that she has not had adequate diuresis at this point and would benefit from overnight diuretics.  We will place in observation [MT]  2333 Admitted to Dr Flossie Buffy [MT]    Clinical Course User Index [MT] Langston Masker Carola Rhine, MD                           Medical Decision Making Risk Prescription drug management. Decision regarding hospitalization.   This patient presents to the ED with concern for lower extremity edema, shortness of breath.  This involves an extensive number of treatment options, and is a complaint that carries with it a high risk of complications and morbidity.  The differential diagnosis includes congestive heart failure versus pneumonia versus viral illness versus other  Co-morbidities that complicate the patient evaluation: History of blood clots, on Eliquis  External records from outside source obtained and reviewed including cardiology office visit and echocardiogram report as noted in history above  I ordered and personally interpreted labs.  The pertinent results include: Troponin 5, BNP normal, CMP largely unremarkable, no AKI, hemoglobin at baseline 10.9, no acute anemia.  I ordered imaging studies including x-ray of the chest I independently visualized and interpreted imaging which showed no evident focal infiltrate to suggest pneumonia, no significant pulmonary edema, no significant change from prior tracing I agree with the radiologist interpretation  The patient was maintained on a cardiac monitor.  I personally viewed and interpreted the cardiac monitored which showed an underlying rhythm of: Sinus rhythm  Per my interpretation the patient's ECG shows normal sinus rhythm without acute ischemic changes, no significant changes from prior tracing  I ordered medication including IV Lasix for dyspnea, lymphedema; evening  Eliquis and Coreg dosing; IV morphine for chronic pain I have reviewed the patients home medicines and have made adjustments as needed  Test Considered:  -Overall have a low clinical suspicion for pulmonary embolism.  The patient has been compliant on her Eliquis, she has an IVC filter, and has no chest pain at this time.  After the interventions noted above, I reevaluated the patient and found that they have: improved - but still tachypneic  Dispostion:  After consideration of the diagnostic results and the patients response to treatment, I feel that the patent would benefit from hospitalization for further diuresis overnight.        Final Clinical Impression(s) / ED Diagnoses Final diagnoses:  Acute on chronic congestive heart failure, unspecified heart failure type [facility])    Rx / DC Orders ED Discharge Orders     None         Karmen Altamirano, Carola Rhine, MD 05/26/21 0004

## 2021-05-25 NOTE — ED Triage Notes (Signed)
Patient reports a hx of lymphedema, noticed her legs were more swollen, hard and warm bilaterally since this morning. SOB w/ ambulation. Tried tylenol, compression stockings and elevation w/o relief.

## 2021-05-25 NOTE — ED Notes (Signed)
Unable to get blood patient would not stop shaking.  I made the nurse aware

## 2021-05-25 NOTE — ED Provider Triage Note (Signed)
Emergency Medicine Provider Triage Evaluation Note  [name] , a 51 y.o. female  was evaluated in triage.  Pt complains of leg swelling.  She reports that she has leg swelling that is worse than usual.  She reports compliance with her eliquis.  She reports that she is short of breath with ambulation,     Physical Exam  BP 140/72 (BP Location: Left Arm)    Pulse 72    Temp 98.1 F (36.7 C) (Oral)    Resp 16    Ht 5\' 2"  (1.575 m)    Wt (!) 147 kg    SpO2 96%    BMI 59.26 kg/m  Gen:   Awake, no distress   Resp:  Normal effort  MSK:   Moves extremities without difficulty  Other:  Limited leg evaluation due to triage setting, leg edema.  Patient is mildly tremulous.   Medical Decision Making  Medically screening exam initiated at 4:52 PM.  Appropriate orders placed.  [name] was informed that the remainder of the evaluation will be completed by another provider, this initial triage assessment does not replace that evaluation, and the importance of remaining in the ED until their evaluation is complete.     Lorin Glass, [place] 05/25/21 2020

## 2021-05-26 ENCOUNTER — Encounter (HOSPITAL_COMMUNITY): Payer: Self-pay | Admitting: Family Medicine

## 2021-05-26 ENCOUNTER — Inpatient Hospital Stay (HOSPITAL_COMMUNITY): Payer: Medicaid Other

## 2021-05-26 DIAGNOSIS — J9621 Acute and chronic respiratory failure with hypoxia: Secondary | ICD-10-CM

## 2021-05-26 DIAGNOSIS — Z6841 Body Mass Index (BMI) 40.0 and over, adult: Secondary | ICD-10-CM

## 2021-05-26 DIAGNOSIS — R609 Edema, unspecified: Secondary | ICD-10-CM

## 2021-05-26 DIAGNOSIS — I517 Cardiomegaly: Secondary | ICD-10-CM | POA: Diagnosis not present

## 2021-05-26 DIAGNOSIS — I5031 Acute diastolic (congestive) heart failure: Secondary | ICD-10-CM

## 2021-05-26 DIAGNOSIS — E6609 Other obesity due to excess calories: Secondary | ICD-10-CM

## 2021-05-26 LAB — RESP PANEL BY RT-PCR (FLU A&B, COVID) ARPGX2
Influenza A by PCR: NEGATIVE
Influenza B by PCR: NEGATIVE
SARS Coronavirus 2 by RT PCR: NEGATIVE

## 2021-05-26 LAB — D-DIMER, QUANTITATIVE: D-Dimer, Quant: 0.42 ug/mL-FEU (ref 0.00–0.50)

## 2021-05-26 LAB — BASIC METABOLIC PANEL
Anion gap: 8 (ref 5–15)
BUN: 13 mg/dL (ref 6–20)
CO2: 30 mmol/L (ref 22–32)
Calcium: 8.9 mg/dL (ref 8.9–10.3)
Chloride: 97 mmol/L — ABNORMAL LOW (ref 98–111)
Creatinine, Ser: 0.87 mg/dL (ref 0.44–1.00)
GFR, Estimated: >60 mL/min (ref >60)
Glucose, Bld: 140 mg/dL — ABNORMAL HIGH (ref 70–99)
Potassium: 4.1 mmol/L (ref 3.5–5.1)
Sodium: 135 mmol/L (ref 135–145)

## 2021-05-26 LAB — CBC
HCT: 34.7 % — ABNORMAL LOW (ref 36.0–46.0)
Hemoglobin: 10.1 g/dL — ABNORMAL LOW (ref 12.0–15.0)
MCH: 27.4 pg (ref 26.0–34.0)
MCHC: 29.1 g/dL — ABNORMAL LOW (ref 30.0–36.0)
MCV: 94.3 fL (ref 80.0–100.0)
Platelets: 136 10*3/uL — ABNORMAL LOW (ref 150–400)
RBC: 3.68 MIL/uL — ABNORMAL LOW (ref 3.87–5.11)
RDW: 16.2 % — ABNORMAL HIGH (ref 11.5–15.5)
WBC: 11.5 10*3/uL — ABNORMAL HIGH (ref 4.0–10.5)
nRBC: 0 % (ref 0.0–0.2)

## 2021-05-26 LAB — TROPONIN I (HIGH SENSITIVITY): Troponin I (High Sensitivity): 6 ng/L (ref <18)

## 2021-05-26 LAB — ECHOCARDIOGRAM COMPLETE
Area-P 1/2: 3.56 cm2
Height: 62 in
S' Lateral: 3.9 cm
Weight: 5184 oz

## 2021-05-26 MED ORDER — MORPHINE SULFATE (PF) 2 MG/ML IV SOLN
2.0000 mg | Freq: Once | INTRAVENOUS | Status: AC
  Administered 2021-05-26: 2 mg via INTRAVENOUS
  Filled 2021-05-26: qty 1

## 2021-05-26 MED ORDER — HYDROCODONE-ACETAMINOPHEN 5-325 MG PO TABS
1.0000 | ORAL_TABLET | ORAL | Status: DC | PRN
  Administered 2021-05-26 – 2021-05-31 (×12): 1 via ORAL
  Filled 2021-05-26 (×13): qty 1

## 2021-05-26 MED ORDER — FUROSEMIDE 10 MG/ML IJ SOLN
80.0000 mg | Freq: Two times a day (BID) | INTRAMUSCULAR | Status: DC
  Administered 2021-05-26 – 2021-05-27 (×3): 80 mg via INTRAVENOUS
  Filled 2021-05-26 (×3): qty 8

## 2021-05-26 MED ORDER — FUROSEMIDE 10 MG/ML IJ SOLN: 80.0000 mg | Freq: Every day | INTRAMUSCULAR | Status: DC

## 2021-05-26 MED ORDER — MORPHINE SULFATE (PF) 2 MG/ML IV SOLN
1.0000 mg | INTRAVENOUS | Status: DC | PRN
  Administered 2021-05-26 – 2021-05-31 (×19): 1 mg via INTRAVENOUS
  Filled 2021-05-26 (×20): qty 1

## 2021-05-26 MED ORDER — POTASSIUM CHLORIDE CRYS ER 20 MEQ PO TBCR
20.0000 meq | EXTENDED_RELEASE_TABLET | Freq: Every day | ORAL | Status: DC
  Administered 2021-05-26 – 2021-06-01 (×7): 20 meq via ORAL
  Filled 2021-05-26 (×6): qty 1

## 2021-05-26 NOTE — Assessment & Plan Note (Addendum)
Continue to hold valsartan with aggressive diuresis Resume spironolactone Continue coreg, amlodipin

## 2021-05-26 NOTE — Progress Notes (Signed)
Bilateral lower extremity venous duplex has been completed. Preliminary results can be found in CV Proc through chart review.   05/26/21 9:15 AM Carlos Levering RVT

## 2021-05-26 NOTE — Assessment & Plan Note (Addendum)
Chronically on 2 L Now has been weaned to RA Treating for HF as above Will follow d dimer (wnl), echo (as above) Consider additional imaging as indicated

## 2021-05-26 NOTE — Assessment & Plan Note (Addendum)
R>L lower extremity edema and pain Seems to be getting better with diuresis, but persistent pain Continue eliquis for now D dimer normal, LE [place] without DVT (limited) Continues to have significant LE pain which I think is related to her overload and lymphedema - she notes this comes/goes and is somewhat of [name] chronic recurrent issues - no overt signs of infection - not sure if any additional imaging would be helpful at this point

## 2021-05-26 NOTE — Assessment & Plan Note (Signed)
Follows with [name] trend

## 2021-05-26 NOTE — Hospital Course (Addendum)
[name] is [name] 51 y.o. female with medical history significant for lymphedema, iron deficiency anemia requiring IV iron transfusion, HFpEF, moderate persistent asthma, chronic hypoxia on 2 L, hypertension, history of DVT on Eliquis who presents with concerns of worsening lower extremity edema and DOE. She's been admitted for presumed HF exacerbation.  See below for additional details

## 2021-05-26 NOTE — Progress Notes (Signed)
Echocardiogram 2D Echocardiogram has been performed.  [name] M 05/26/2021, 10:44 AM

## 2021-05-26 NOTE — Assessment & Plan Note (Addendum)
Worsening LE edema, DOE Presumed HF exacerbation CXR with pulm venous congestion, bilateral LE edema Strict I/O, daily weights (166.9 lbs on 1/29) - net negative 9 L, weights likely inaccurate Lasix 80 PO daily at home Continue lasix 80 mg to TID   (continues to be overloaded at this time, though she's improving, will change tomorrows lasix dose to BID and allow next provider to follow up frequency and timing of transition to PO) Echo with EF 60-65%, mild LVH (see report)

## 2021-05-26 NOTE — Assessment & Plan Note (Signed)
noted 

## 2021-05-26 NOTE — Progress Notes (Signed)
Received VAST consult to access right chest PAC. Noted catheter kinked at base of patient's neck per chest x-ray from 1/27. Notified primary RN Mia Creek that [facility] should not be used at this time.

## 2021-05-26 NOTE — H&P (Signed)
History and Physical    [name] CMK:[id number] DOB: 28-Sep-1970 DOA: 05/25/2021  PCP: Ladell Pier, MD  Patient coming from: Home  I have personally briefly reviewed patient's old medical records in [place]  Chief Complaint: Increasing lower extremity edema  HPI: [name] is a 51 y.o. female with medical history significant for lymphedema, iron deficiency anemia requiring IV iron transfusion, HFpEF, moderate persistent asthma, chronic hypoxia on 2 L, hypertension, history of DVT on Eliquis who presents with concerns of worsening lower extremity edema.  Patient normally has chronic lymphedema but for the past week has not been able to put on her compression stockings due to worsening edema.  She is compliant with her daily 80 mg Lasix but feels it has not been working.  Has also felt more dyspnea.  She is chronically on 2 L of oxygen supplementation although has not had to increase this.  ED Course: Was afebrile, mildly tachycardic and hypertensive on baseline 2 L. Chest x-ray shows pulmonary congestion, BNP of 76, troponin of 5.  EKG shows normal sinus rhythm.  No leukocytosis, hemoglobin of 10.9, platelet of 140.  Creatinine 0.86.  Negative flu and COVID PCR.  She was given IV 80 mg Lasix in the ED.  Hospitalist was then called for admission.  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   Morbid obesity due to excess calories ([place])    Osteoarthritis of left knee    Prediabetes    Small bowel obstruction ([place])    as a child   Speech impediment    Stutter / stammer    Past Surgical History:  Procedure Laterality Date   ABDOMINAL WALL DEFECT REPAIR  1970   IR CV LINE INJECTION  10/24/2020   IVC FILTER INSERTION  2017   [place]  2014     reports that she has quit smoking. She has never used smokeless tobacco. She reports that she does not currently use alcohol. She reports that she does not currently use drugs. Social History  Allergies  Allergen Reactions   Ace Inhibitors Rash and Other (See Comments)    Make pt bleed   Aspirin Other (See Comments)    Per patient paperwork: blood clot?  Likely because of chronic DOAC   Hydromorphone Hives and Itching   Vancomycin Itching and Rash   Contrast Media [Iodinated Contrast Media] Hives   Dilaudid [Hydromorphone Hcl] Hives    Family History  Problem Relation Age of Onset   Diabetes Mellitus II Mother    COPD Father    Diabetes  Father    Diabetes Mellitus II Maternal Grandmother    Breast cancer Paternal Grandfather      Prior to Admission medications   Medication Sig Start Date End Date Taking? Authorizing Provider  Accu-Chek Softclix Lancets lancets Use as instructed 11/29/20   Argentina Donovan, PA-C  acetaminophen (TYLENOL) 500 MG tablet Take 1 tablet (500 mg total) by mouth every 6 (six) hours as needed. 04/19/20   Ladell Pier, MD  albuterol [facility] HFA) 108 [phone] Base) MCG/ACT inhaler INHALE TWO PUFFS BY MOUTH EVERY 6 HOURS AS NEEDED FOR WHEEZING OR SHORTNESS OF BREATH 10/05/20   Ladell Pier, MD  amLODipine (NORVASC) 5 MG tablet  TAKE 1/2 TABLET BY MOUTH DAILY 04/26/21   Ladell Pier, MD  atorvastatin (LIPITOR) 40 MG tablet Take 1 tablet (40 mg total) by mouth daily. 11/29/20   Argentina Donovan, PA-C  Blood Glucose Calibration (ACCU-CHEK GUIDE CONTROL) LIQD 1 each by In Vitro route daily. 12/13/20   Argentina Donovan, PA-C  Blood Glucose Monitoring Suppl (ACCU-CHEK GUIDE) w/Device KIT 1 each by Does not apply route 2 (two) times daily. 11/29/20   Argentina Donovan, PA-C  Carboxymethylcellulose Sodium (EYE DROPS OP) Place 1 drop into both eyes daily as needed (dry eyes).    [provider]  carvedilol (COREG) 25 MG tablet TAKE ONE TABLET BY MOUTH TWICE A DAY WITH MEALS 01/04/21   Ladell Pier, MD  cholecalciferol (VITAMIN D3) 25 MCG (1000 UNIT) tablet Take 1 tablet (1,000 Units total) by mouth daily. 04/07/20   Ladell Pier, MD  dapagliflozin propanediol (FARXIGA) 10 MG TABS tablet Take 1 tablet (10 mg total) by mouth daily. 12/29/20   Ladell Pier, MD  doxycycline (VIBRA-TABS) 100 MG tablet Take 1 tablet (100 mg total) by mouth 2 (two) times daily. 11/29/20   Argentina Donovan, PA-C  DULERA 200-5 MCG/ACT AERO INHALE 2 PUFFS INTO THE LUNGS TWO TIMES A DAY 03/11/21   Ladell Pier, MD  ELIQUIS 5 MG TABS tablet TAKE ONE TABLET BY MOUTH TWICE A DAY 05/16/21   Ladell Pier, MD  ferrous sulfate 325 (65 FE) MG tablet Take 1 tablet (325 mg total) by mouth daily with breakfast. 03/01/21   Ladell Pier, MD  furosemide (LASIX) 80 MG tablet Take 1 tablet (80 mg total) by mouth daily. 05/01/21   Fenton Foy, NP  gabapentin (NEURONTIN) 100 MG capsule TAKE 1 CAPSULE BY MOUTH THREE TIMES A DAY AS NEEDED FOR PAN 04/26/21   Ladell Pier, MD  glucose blood test strip Use as instructed 11/29/20   Argentina Donovan, PA-C  hydrocortisone cream 1 % Apply 1 application topically daily as needed for itching.    [provider]  hydrOXYzine (VISTARIL) 25 MG capsule Take 1 capsule (25 mg total) by  mouth at bedtime as needed. 04/20/21   Ladell Pier, MD  KLOR-CON M20 20 MEQ tablet TAKE ONE TABLET BY MOUTH DAILY 12/06/20   Ladell Pier, MD  lidocaine (LIDODERM) 5 % Place 1 patch onto the skin daily. Leave on for 12 hrs each day then remove. 04/18/21   Ladell Pier, MD  methocarbamol (ROBAXIN) 500 MG tablet TAKE ONE TABLET BY MOUTH DAILY AS NEEDED FOR MUSCLE SPASMS 04/18/21   Ladell Pier, MD  nitroGLYCERIN (NITROSTAT) 0.4 MG SL tablet Place 1 tablet (0.4 mg total) under the tongue every 5 (five) minutes as needed for chest pain. 11/27/20   Aline August,  MD  omeprazole (PRILOSEC) 20 MG capsule Take 1 capsule (20 mg total) by mouth daily as needed (acid reflux). 05/01/21 05/31/21  Fenton Foy, NP  PARoxetine (PAXIL) 10 MG tablet TAKE ONE TABLET BY MOUTH DAILY 04/03/21   Ladell Pier, MD  spironolactone (ALDACTONE) 25 MG tablet Take 1 tablet (25 mg total) by mouth daily. 01/04/21   Ladell Pier, MD  valsartan (DIOVAN) 160 MG tablet Take 1 tablet (160 mg total) by mouth daily. 02/14/21   Leonie Man, MD  vitamin B-12 (CYANOCOBALAMIN) 1000 MCG tablet Take 1,000 mcg by mouth daily.    [provider]    Physical Exam: Vitals:   05/26/21 0045 05/26/21 0100 05/26/21 0115 05/26/21 0145  BP: (!) 156/104  102/60 95/61  Pulse: 92 88  85  Resp: _0 Temp:      TempSrc:      SpO2: 99% 99%  99%  Weight:      Height:        Constitutional: NAD, calm, comfortable, morbidly obese female sitting up at 40 degree incline Vitals:   05/26/21 0045 05/26/21 0100 05/26/21 0115 05/26/21 0145  BP: (!) 156/104  102/60 95/61  Pulse: 92 88  85  Resp: _1 Temp:      TempSrc:      SpO2: 99% 99%  99%  Weight:      Height:       Eyes: lids and conjunctivae normal ENMT: Mucous membranes are moist.  Neck: normal, supple Respiratory: clear to auscultation bilaterally, no wheezing, no crackles. Normal respiratory effort. No accessory muscle use.   Cardiovascular: Regular rate and rhythm, no murmurs / rubs / gallops. +2 pitting edema of bilateral LE up to knee Abdomen: no tenderness,  Bowel sounds positive.  Musculoskeletal: no clubbing / cyanosis. No joint deformity upper and lower extremities. Good ROM, no contractures. Normal muscle tone.  Skin: no rashes, lesions, ulcers.  Neurologic: CN 2-12 grossly intact. Strength 5/5 in all 4.  Psychiatric: Normal judgment and insight. Alert and oriented x 3. Normal mood.    Labs on Admission: I have personally reviewed following labs and imaging studies  CBC: Recent Labs  Lab 05/25/21 2145  WBC 8.7  NEUTROABS 8.1*  HGB 10.9*  HCT 37.8  MCV 93.1  PLT 397*   Basic Metabolic Panel: Recent Labs  Lab 05/25/21 2145  NA 133*  K 3.9  CL 96*  CO2 29  GLUCOSE 132*  BUN 12  CREATININE 0.86  CALCIUM 9.0   GFR: Estimated Creatinine Clearance: 107.4 mL/min (by C-G formula based on SCr of 0.86 mg/dL). Liver Function Tests: Recent Labs  Lab 05/25/21 2145  AST 15  ALT 12  ALKPHOS 63  BILITOT 1.2  PROT 8.1  ALBUMIN 4.0   No results for input(s): LIPASE, AMYLASE in the last 168 hours. No results for input(s): AMMONIA in the last 168 hours. Coagulation Profile: No results for input(s): INR, PROTIME in the last 168 hours. Cardiac Enzymes: No results for input(s): CKTOTAL, CKMB, CKMBINDEX, TROPONINI in the last 168 hours. BNP (last 3 results) No results for input(s): PROBNP in the last 8760 hours. HbA1C: No results for input(s): HGBA1C in the last 72 hours. CBG: Recent Labs  Lab 05/25/21 1705 05/25/21 1825 05/25/21 2139  GLUCAP 77 98 132*   Lipid Profile: No results for input(s): CHOL, HDL, LDLCALC, TRIG, CHOLHDL, LDLDIRECT in the last 72 hours. Thyroid Function Tests: No results for input(s): TSH,  T4TOTAL, FREET4, T3FREE, THYROIDAB in the last 72 hours. Anemia Panel: No results for input(s): VITAMINB12, FOLATE, FERRITIN, TIBC, IRON, RETICCTPCT in the last 72  hours. Urine analysis:    Component Value Date/Time   COLORURINE YELLOW 11/22/2020 2210   APPEARANCEUR CLEAR 11/22/2020 2210   LABSPEC 1.025 11/22/2020 2210   PHURINE 5.0 11/22/2020 2210   GLUCOSEU NEGATIVE 11/22/2020 2210   HGBUR NEGATIVE 11/22/2020 2210   BILIRUBINUR negative 12/29/2020 0902   KETONESUR negative 12/29/2020 0902   KETONESUR NEGATIVE 11/22/2020 2210   PROTEINUR NEGATIVE 11/22/2020 2210   UROBILINOGEN 0.2 12/29/2020 0902   NITRITE Negative 12/29/2020 0902   NITRITE NEGATIVE 11/22/2020 2210   LEUKOCYTESUR Negative 12/29/2020 0902   LEUKOCYTESUR NEGATIVE 11/22/2020 2210    Radiological Exams on Admission: DG Chest 2 View  Result Date: 05/25/2021 CLINICAL DATA:  Short of breath EXAM: CHEST - 2 VIEW COMPARISON:  Chest x-ray 11/22/2020 FINDINGS: Similar-appearing Port-A-Cath with tip overlying the expected region of the superior cavoatrial junction. The heart and mediastinal contours are unchanged. Prominent azygous vein again noted along the right mediastinal silhouette. Increased hilar vascular prominence. No focal consolidation. No pulmonary edema. No pleural effusion. No pneumothorax. No acute osseous abnormality. IMPRESSION: 1. Pulmonary venous congestion. 2.  Aortic Atherosclerosis (ICD10-I70.0). Electronically Signed   By: Iven Finn M.D.   On: 05/25/2021 17:30      Assessment/Plan  Acute on chronic diastolic heart failure -Last echocardiogram on 10/2020 with a EF of 55 to 60% and grade 1 diastolic dysfunction.  She remains compliant with her IV Lasix.  Will obtain repeat echo -Continue daily IV Lasix 80 mg - Strict intake and output, daily weights  Acute on chronic hypoxic respiratory failure -Has baseline 2 L O2 oxygen requirement.  Has not had to increase but has increasing dyspnea with exertion  Hypertension -BP readings in ED do not appear adaquate. Could be due to BP Cuff sizing and her morbid obesity -resume home meds once med rec is  verified  Iron deficiency anemia -Follows with hematology and receives as needed IV Venofer -Hemoglobin stable around her baseline at 10.9  History of DVT Continue Eliquis  Morbid obesity BMI of 59  DVT prophylaxis:.Eliquis Code Status: Full Family Communication: Plan discussed with patient at bedside  disposition Plan: Home with at least 2 midnight stays  Consults called:  Admission status: inpatient  Level of care: Telemetry  Status is: Inpatient  Remains inpatient appropriate because: Admit - It is my clinical opinion that admission to INPATIENT is reasonable and necessary because this patient will require at least 2 midnights in the hospital to treat this condition based on the medical complexity of the problems presented.  Given the aforementioned information, the predictability of an adverse outcome is felt to be significant.         Orene Desanctis DO [facility]   If 7PM-7AM, please contact night-coverage [email or web address]   05/26/2021, 2:10 AM

## 2021-05-26 NOTE — Assessment & Plan Note (Signed)
On eliquis S/p IVC filter in 2017 Per [name] 01/2021 note, hx of at least 10 clotting events, denied recurrent clots since eliquis - PCP managing anticoagulation

## 2021-05-26 NOTE — Progress Notes (Signed)
PROGRESS NOTE    [name]  ZDG:[id number] DOB: 09-11-1970 DOA: 05/25/2021 PCP: Ladell Pier, MD  Chief Complaint  Patient presents with   Leg Pain    Brief Narrative:   Anysha Frappier is Draven Natter 51 y.o. female with medical history significant for lymphedema, iron deficiency anemia requiring IV iron transfusion, HFpEF, moderate persistent asthma, chronic hypoxia on 2 L, hypertension, history of DVT on Eliquis who presents with concerns of worsening lower extremity edema and DOE. She's been admitted for presumed HF exacerbation.    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 (HCC)   * Acute CHF (congestive heart failure) (HCC) Worsening LE edema, DOE Presumed HF exacerbation CXR with pulm venous congestion, bilateral LE edema Strict I/O, daily weights Lasix 80 PO daily at home Will give 80 IV BID here Repeat echo pending - previous in 12/5186 with diastolic dysfunctino  Acute on chronic respiratory failure with hypoxia (HCC) Chronically on 2 L, now with worsening DOE Treating for HF as above Will follow d dimer, echo Consider additional imaging as indicated  Bilateral lower extremity edema R>L lower extremity edema which is different per patient Follow LE [place] Continue eliquis for now D dimer, LE [place]  History of DVT (deep vein thrombosis) On eliquis S/p IVC filter in 2017 Per [name] 01/2021 note, hx of at least 10 clotting events, denied recurrent clots since eliquis - PCP managing  anticoagulation  Iron deficiency anemia due to chronic blood loss- (present on admission) Follows with [name] trend  Morbid obesity with BMI of 50.0-59.9, adult ([place]) noted  Essential hypertension Awaiting med rec to resume home meds   DVT prophylaxis: eliquis Code Status: full Family Communication: none Disposition:   Status is: Inpatient  Remains inpatient appropriate because: need for IV diuresis       Consultants:  none  Procedures:  none  Antimicrobials:  Anti-infectives (From admission, onward)    None       Subjective: C/o DOE LE edema  Objective: Vitals:   05/26/21 0730 05/26/21 0800 05/26/21 0830 05/26/21 0900  BP: 105/76 113/80 110/71 116/85  Pulse: 77 73 74 73  Resp: 13 14 16 16   Temp:      TempSrc:      SpO2: 100% 100% 100% 100%  Weight:      Height:       No intake or output data in the 24 hours ending 05/26/21 0926 Filed Weights   05/25/21 1640  Weight: (!) 147 kg    Examination:  General exam: Appears calm and comfortable  Respiratory system: unlabored, no clear adventitious lung sounds, diminished Cardiovascular system: RRR Gastrointestinal system: Abdomen is nondistended, soft and nontender. Central nervous system: Alert and oriented. No focal neurological deficits. Extremities: R>L LE edema   Data Reviewed: I have personally reviewed following labs and imaging studies  CBC: Recent Labs  Lab 05/25/21 2145 05/26/21 0445  WBC 8.7 11.5*  NEUTROABS 8.1*  --   HGB 10.9* 10.1*  HCT 37.8 34.7*  MCV 93.1 94.3  PLT 140* 136*    Basic Metabolic Panel: Recent Labs  Lab 05/25/21 2145 05/26/21 0445  NA 133* 135  K 3.9 4.1  CL 96* 97*  CO2 29 30  GLUCOSE 132* 140*  BUN 12 13  CREATININE 0.86 0.87  CALCIUM 9.0 8.9    GFR: Estimated Creatinine Clearance: 106.2 mL/min (by C-G formula based on SCr of 0.87 mg/dL).  Liver Function Tests: Recent Labs  Lab 05/25/21 2145  AST 15  ALT 12  ALKPHOS 63  BILITOT  1.2  PROT 8.1  ALBUMIN 4.0    CBG: Recent Labs  Lab 05/25/21 1705 05/25/21 1825 05/25/21 2139  GLUCAP 77 98 132*     Recent Results (from the past 240 hour(s))  Resp Panel by RT-PCR (Flu Fawnda Vitullo&B, Covid) Nasopharyngeal Swab     Status: None   Collection Time: 05/25/21  9:55 PM   Specimen: Nasopharyngeal Swab; Nasopharyngeal(NP) swabs in vial transport medium  Result Value Ref Range Status   SARS Coronavirus 2 by RT PCR NEGATIVE NEGATIVE Final    Comment: (NOTE) SARS-CoV-2 target nucleic acids are NOT DETECTED.  The SARS-CoV-2 RNA is generally detectable in upper respiratory specimens during the acute phase of infection. The lowest concentration of SARS-CoV-2 viral copies this assay can detect is 138 copies/mL. Mykeisha Dysert negative result does not preclude SARS-Cov-2 infection and should not be used as the sole basis for treatment or other patient management decisions. Signa Cheek negative result may occur with  improper specimen collection/handling, submission of specimen other than nasopharyngeal swab, presence of viral mutation(s) within the areas targeted by this assay, and inadequate number of viral copies(<138 copies/mL). Diane Mochizuki negative result must be combined with clinical observations, patient history, and epidemiological information. The expected result is Negative.  Fact Sheet for Patients:  EntrepreneurPulse.com.au  Fact Sheet for Healthcare Providers:  IncredibleEmployment.be  This test is no t yet approved or cleared by the [place] FDA and  has been authorized for detection and/or diagnosis of SARS-CoV-2 by FDA under an Emergency Use Authorization (EUA). This EUA will remain  in effect (meaning this test can be used) for the duration of the COVID-19 declaration under Section 564(b)(1) of the Act, 21 U.S.C.section 360bbb-3(b)(1), unless the authorization is terminated  or revoked sooner.       Influenza Eliyah Bazzi by PCR NEGATIVE NEGATIVE Final    Influenza B by PCR NEGATIVE NEGATIVE Final    Comment: (NOTE) The Xpert Xpress SARS-CoV-2/FLU/RSV plus assay is intended as an aid in the diagnosis of influenza from Nasopharyngeal swab specimens and should not be used as Alesana Magistro sole basis for treatment. Nasal washings and aspirates are unacceptable for Xpert Xpress SARS-CoV-2/FLU/RSV testing.  Fact Sheet for Patients: EntrepreneurPulse.com.au  Fact Sheet for Healthcare Providers: IncredibleEmployment.be  This test is not yet approved or cleared by the [place] FDA and has been authorized for detection and/or diagnosis of SARS-CoV-2 by FDA under an Emergency Use Authorization (EUA). This EUA will remain in effect (meaning this test can be used) for the duration of the COVID-19 declaration under Section 564(b)(1) of the Act, 21 U.S.C. section 360bbb-3(b)(1), unless the authorization is terminated or revoked.  Performed at [facility], [place] [name], [place], [place] [address]          Radiology Studies: DG Chest 2 View  Result Date: 05/25/2021 CLINICAL DATA:  Short of breath EXAM: CHEST - 2 VIEW COMPARISON:  Chest x-ray 11/22/2020 FINDINGS: Similar-appearing [place]-Cath with tip overlying the expected region of  the superior cavoatrial junction. The heart and mediastinal contours are unchanged. Prominent azygous vein again noted along the right mediastinal silhouette. Increased hilar vascular prominence. No focal consolidation. No pulmonary edema. No pleural effusion. No pneumothorax. No acute osseous abnormality. IMPRESSION: 1. Pulmonary venous congestion. 2.  Aortic Atherosclerosis (ICD10-I70.0). Electronically Signed   By: Iven Finn M.D.   On: 05/25/2021 17:30   VAS [place] LOWER EXTREMITY VENOUS (DVT)  Result Date: 05/26/2021  Lower Venous DVT Study Patient Name:  KALEIGH SPIEGELMAN  Date of Exam:   05/26/2021 Medical Rec #: [id number]                Accession #:     [id number] Date of Birth: 1971-01-17                 Patient Gender: F Patient Age:   51 years Exam Location:  [facility] Procedure:      VAS [place] LOWER EXTREMITY VENOUS (DVT) Referring Phys: Mykala Mccready POWELL JR --------------------------------------------------------------------------------  Indications: Edema.  Risk Factors: None identified. Limitations: Body habitus, poor ultrasound/tissue interface and patient positioning, patient pain tolerance. Comparison Study: No prior studies. Performing Technologist: Oliver Hum RVT  Examination Guidelines: Charmel Pronovost complete evaluation includes B-mode imaging, spectral Doppler, color Doppler, and power Doppler as needed of all accessible portions of each vessel. Bilateral testing is considered an integral part of Yunus Stoklosa complete examination. Limited examinations for reoccurring indications may be performed as noted. The reflux portion of the exam is performed with the patient in reverse Trendelenburg.  +---------+---------------+---------+-----------+----------+-------------------+  RIGHT     Compressibility Phasicity Spontaneity Properties Thrombus Aging       +---------+---------------+---------+-----------+----------+-------------------+  CFV       Full            Yes       Yes                                         +---------+---------------+---------+-----------+----------+-------------------+  SFJ       Full                                                                  +---------+---------------+---------+-----------+----------+-------------------+  FV Prox   Full                                                                  +---------+---------------+---------+-----------+----------+-------------------+  FV Mid                    Yes       Yes                                         +---------+---------------+---------+-----------+----------+-------------------+  FV Distal                 Yes       Yes                                          +---------+---------------+---------+-----------+----------+-------------------+  PFV       Full                                                                  +---------+---------------+---------+-----------+----------+-------------------+  POP       Full            Yes       Yes                                         +---------+---------------+---------+-----------+----------+-------------------+  PTV       Full                                                                  +---------+---------------+---------+-----------+----------+-------------------+  PERO                                                       Not well visualized  +---------+---------------+---------+-----------+----------+-------------------+   +---------+---------------+---------+-----------+----------+-------------------+  LEFT      Compressibility Phasicity Spontaneity Properties Thrombus Aging       +---------+---------------+---------+-----------+----------+-------------------+  CFV       Full            Yes       Yes                                         +---------+---------------+---------+-----------+----------+-------------------+  SFJ       Full                                                                  +---------+---------------+---------+-----------+----------+-------------------+  FV Prox   Full                                                                  +---------+---------------+---------+-----------+----------+-------------------+  FV Mid                    Yes       Yes                                         +---------+---------------+---------+-----------+----------+-------------------+  FV Distal                   Yes       Yes                                         +---------+---------------+---------+-----------+----------+-------------------+  PFV       Full                                                                  +---------+---------------+---------+-----------+----------+-------------------+   POP       Full            Yes       Yes                                         +---------+---------------+---------+-----------+----------+-------------------+  PTV       Full                                                                  +---------+---------------+---------+-----------+----------+-------------------+  PERO                                                       Not well visualized  +---------+---------------+---------+-----------+----------+-------------------+     Summary: RIGHT: - There is no evidence of deep vein thrombosis in the lower extremity. However, portions of this examination were limited- see technologist comments above.  - No cystic structure found in the popliteal fossa.  LEFT: - There is no evidence of deep vein thrombosis in the lower extremity. However, portions of this examination were limited- see technologist comments above.  - No cystic structure found in the popliteal fossa.  *See table(s) above for measurements and observations.    Preliminary         Scheduled Meds:  apixaban  5 mg Oral BID   carvedilol  25 mg Oral BID WC   furosemide  80 mg Intravenous BID   potassium chloride  20 mEq Oral Daily   Continuous Infusions:   LOS: 1 day    Time spent: over 30 min    Fayrene Helper, MD [facility]   To contact the attending provider between 7A-7P or the covering provider during after hours 7P-7A, please log into the web site [email or web address] and access using universal [place] password for that web site. If you do not have the password, please call the hospital operator.  05/26/2021, 9:26 AM

## 2021-05-27 DIAGNOSIS — Z452 Encounter for adjustment and management of vascular access device: Secondary | ICD-10-CM

## 2021-05-27 DIAGNOSIS — I5031 Acute diastolic (congestive) heart failure: Secondary | ICD-10-CM | POA: Diagnosis not present

## 2021-05-27 LAB — COMPREHENSIVE METABOLIC PANEL
ALT: 10 U/L (ref 0–44)
AST: 15 U/L (ref 15–41)
Albumin: 3.4 g/dL — ABNORMAL LOW (ref 3.5–5.0)
Alkaline Phosphatase: 55 U/L (ref 38–126)
Anion gap: 7 (ref 5–15)
BUN: 23 mg/dL — ABNORMAL HIGH (ref 6–20)
CO2: 31 mmol/L (ref 22–32)
Calcium: 8.7 mg/dL — ABNORMAL LOW (ref 8.9–10.3)
Chloride: 96 mmol/L — ABNORMAL LOW (ref 98–111)
Creatinine, Ser: 1.1 mg/dL — ABNORMAL HIGH (ref 0.44–1.00)
GFR, Estimated: >60 mL/min (ref >60)
Glucose, Bld: 134 mg/dL — ABNORMAL HIGH (ref 70–99)
Potassium: 3.7 mmol/L (ref 3.5–5.1)
Sodium: 134 mmol/L — ABNORMAL LOW (ref 135–145)
Total Bilirubin: 1.6 mg/dL — ABNORMAL HIGH (ref 0.3–1.2)
Total Protein: 7.2 g/dL (ref 6.5–8.1)

## 2021-05-27 LAB — PHOSPHORUS: Phosphorus: 4.7 mg/dL — ABNORMAL HIGH (ref 2.5–4.6)

## 2021-05-27 LAB — CBC WITH DIFFERENTIAL/PLATELET
Abs Immature Granulocytes: 0.01 10*3/uL (ref 0.00–0.07)
Basophils Absolute: 0 10*3/uL (ref 0.0–0.1)
Basophils Relative: 0 %
Eosinophils Absolute: 0.2 10*3/uL (ref 0.0–0.5)
Eosinophils Relative: 3 %
HCT: 33.5 % — ABNORMAL LOW (ref 36.0–46.0)
Hemoglobin: 9.9 g/dL — ABNORMAL LOW (ref 12.0–15.0)
Immature Granulocytes: 0 %
Lymphocytes Relative: 9 %
Lymphs Abs: 0.6 10*3/uL — ABNORMAL LOW (ref 0.7–4.0)
MCH: 27.6 pg (ref 26.0–34.0)
MCHC: 29.6 g/dL — ABNORMAL LOW (ref 30.0–36.0)
MCV: 93.3 fL (ref 80.0–100.0)
Monocytes Absolute: 0.7 10*3/uL (ref 0.1–1.0)
Monocytes Relative: 12 %
Neutro Abs: 4.6 10*3/uL (ref 1.7–7.7)
Neutrophils Relative %: 76 %
Platelets: 133 10*3/uL — ABNORMAL LOW (ref 150–400)
RBC: 3.59 MIL/uL — ABNORMAL LOW (ref 3.87–5.11)
RDW: 16.4 % — ABNORMAL HIGH (ref 11.5–15.5)
WBC: 6 10*3/uL (ref 4.0–10.5)
nRBC: 0 % (ref 0.0–0.2)

## 2021-05-27 LAB — MAGNESIUM: Magnesium: 2 mg/dL (ref 1.7–2.4)

## 2021-05-27 MED ORDER — FUROSEMIDE 10 MG/ML IJ SOLN
80.0000 mg | Freq: Three times a day (TID) | INTRAMUSCULAR | Status: DC
  Administered 2021-05-27 – 2021-05-29 (×7): 80 mg via INTRAVENOUS
  Filled 2021-05-27 (×7): qty 8

## 2021-05-27 NOTE — Progress Notes (Signed)
PROGRESS NOTE    [name]  RSW:[id number] DOB: 11-23-1970 DOA: 05/25/2021 PCP: Ladell Pier, MD  Chief Complaint  Patient presents with   Leg Pain    Brief Narrative:   [name] is [name] 51 y.o. female with medical history significant for lymphedema, iron deficiency anemia requiring IV iron transfusion, HFpEF, moderate persistent asthma, chronic hypoxia on 2 L, hypertension, history of DVT on Eliquis who presents with concerns of worsening lower extremity edema and DOE. She's been admitted for presumed HF exacerbation.    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 (HCC)   Encounter for care related to [place] Demeyer-Cath   * Acute CHF (congestive heart failure) (HCC) Worsening LE edema, DOE Presumed HF exacerbation CXR with pulm venous congestion, bilateral LE edema Strict I/O, daily weights (166.9 lbs on 1/29) Lasix 80 PO daily at home Increase lasix to TID dosing Echo with EF 60-65%, mild LVH (see report)  Acute on chronic respiratory failure with hypoxia (HCC) Chronically on 2 L, now with worsening DOE Treating for HF as above Will follow d dimer (wnl), echo (as above) Consider additional imaging as indicated  Bilateral lower extremity edema R>L lower extremity edema which is different per patient Follow LE [place] Continue eliquis for now D dimer normal, LE [place] without DVT (limited)   History of DVT (deep vein thrombosis) On eliquis S/p IVC filter in 2017 Per [name] 01/2021 note, hx of at least  10 clotting events, denied recurrent clots since eliquis - PCP managing anticoagulation  Iron deficiency anemia due to chronic blood loss- (present on admission) Follows with [name] trend  Morbid obesity with BMI of 50.0-59.9, adult ([place]) noted  Essential hypertension Awaiting med rec to resume home meds  Encounter for care related to [place] Oravec-Cath Will request removal given inability to use at this time   DVT prophylaxis: eliquis Code Status: full Family Communication: none Disposition:   Status is: Inpatient  Remains inpatient appropriate because: need for IV diuresis       Consultants:  none  Procedures:  none  Antimicrobials:  Anti-infectives (From admission, onward)    None       Subjective: No new complaints Persistent LE discomfort  Objective: Vitals:   05/26/21 2349 05/27/21 0410 05/27/21 0426 05/27/21 1313  BP: 131/70 113/75  (!) 89/67  Pulse: 75 79  80  Resp: 18 16  18   Temp: 97.7 F (36.5 C) 97.7 F (36.5 C)  98.2 F (36.8 C)  TempSrc: Oral Oral  Oral  SpO2: 100% 100%  100%  Weight:   (!) 166.9 kg   Height:        Intake/Output Summary (Last 24 hours) at 05/27/2021 1459 Last data filed at 05/27/2021 1257 Gross per 24 hour  Intake 580 ml  Output 2650 ml  Net -2070 ml   Filed Weights   05/25/21 1640 05/26/21 1419 05/27/21 0426  Weight: (!) 147 kg (!) 166.7 kg (!) 166.9 kg    Examination:  General: No acute distress.  Cardiovascular: RRR Lungs: unlabored Abdomen: Soft, nontender, nondistended  Neurological: Alert and oriented 3. Moves all extremities 4 . Cranial nerves II through XII grossly intact. Skin: Warm and dry. No rashes or lesions. Extremities: R>L LE edema, tender   Data Reviewed: I have personally reviewed following labs and imaging studies  CBC: Recent Labs  Lab 05/25/21 2145 05/26/21 0445 05/27/21 0355  WBC 8.7 11.5* 6.0  NEUTROABS 8.1*  --  4.6  HGB 10.9* 10.1* 9.9*  HCT 37.8 34.7* 33.5*  MCV 93.1  94.3 93.3  PLT 140* 136* 133*    Basic Metabolic Panel: Recent Labs  Lab 05/25/21 2145 05/26/21 0445 05/27/21 0355  NA 133* 135 134*  K 3.9 4.1 3.7  CL 96* 97* 96*  CO2 29 30 31   GLUCOSE 132* 140* 134*  BUN 12 13 23*  CREATININE 0.86 0.87 1.10*  CALCIUM 9.0 8.9 8.7*  MG  --   --  2.0  PHOS  --   --  4.7*    GFR: Estimated Creatinine Clearance: 91.4 mL/min ([place]icht) (by C-G formula based on SCr of 1.1 mg/dL (H)).  Liver Function Tests: Recent Labs  Lab 05/25/21 2145 05/27/21 0355  AST 15 15  ALT 12 10  ALKPHOS 63 55  BILITOT 1.2 1.6*  PROT 8.1 7.2  ALBUMIN 4.0 3.4*    CBG: Recent Labs  Lab 05/25/21 1705 05/25/21 1825 05/25/21 2139  GLUCAP 77 98 132*     Recent Results (from the past 240 hour(s))  Resp Panel by RT-PCR (Flu Bartt Gonzaga&B, Covid) Nasopharyngeal Swab     Status: None   Collection Time: 05/25/21  9:55 PM   Specimen: Nasopharyngeal Swab; Nasopharyngeal(NP) swabs in vial transport medium  Result Value Ref Range Status   SARS Coronavirus 2 by RT PCR NEGATIVE NEGATIVE Final    Comment: (NOTE) SARS-CoV-2 target nucleic acids are NOT DETECTED.  The SARS-CoV-2 RNA is generally detectable in upper respiratory specimens during the acute phase of infection. The lowest concentration of SARS-CoV-2 viral copies this assay can detect is 138 copies/mL. Ludella Pranger negative result does not preclude SARS-Cov-2 infection and should not be used as the sole basis for treatment or other patient management decisions. Veneta Sliter negative result may occur with  improper specimen collection/handling, submission of specimen other than nasopharyngeal swab, presence of viral mutation(s) within the areas targeted by this assay, and inadequate number of viral copies(<138 copies/mL). Tishia Maestre negative result must be combined with clinical observations, patient history, and epidemiological information. The expected result is Negative.  Fact Sheet for Patients:   EntrepreneurPulse.com.au  Fact Sheet for Healthcare Providers:  IncredibleEmployment.be  This test is no t yet approved or cleared by the [place] FDA and  has been authorized for detection and/or diagnosis of SARS-CoV-2 by FDA under an Emergency Use Authorization (EUA). This EUA will remain  in effect (meaning this test can be used) for the duration of the COVID-19 declaration under Section 564(b)(1) of the Act, 21 U.S.C.section 360bbb-3(b)(1), unless the authorization is terminated  or revoked sooner.       Influenza Arleta Ostrum by PCR NEGATIVE NEGATIVE Final   Influenza B by PCR NEGATIVE NEGATIVE Final    Comment: (NOTE) The Xpert Xpress SARS-CoV-2/FLU/RSV plus assay is intended as an aid in the diagnosis of influenza from Nasopharyngeal swab specimens and should not be used as Eriel Dunckel sole basis for treatment. Nasal washings and aspirates are unacceptable for Xpert Xpress SARS-CoV-2/FLU/RSV testing.  Fact Sheet for Patients: EntrepreneurPulse.com.au  Fact Sheet for Healthcare Providers: IncredibleEmployment.be  This test is not yet approved or cleared by the [place] and has been authorized for detection and/or diagnosis of SARS-CoV-2 by FDA under an Emergency Use Authorization (EUA). This EUA will remain in effect (meaning this test can be used) for the duration of the COVID-19 declaration under Section 564(b)(1) of the Act, 21 U.S.C. section 360bbb-3(b)(1), unless the authorization is terminated or revoked.  Performed at [facility], [place] [address], [place], [place] [address]          Radiology Studies: DG Chest 2 View  Result Date: 05/25/2021 CLINICAL DATA:  Short of breath EXAM: CHEST - 2 VIEW COMPARISON:  Chest x-ray 11/22/2020 FINDINGS: Similar-appearing [place] Pope-Cath with tip overlying the expected region of the superior cavoatrial junction. The heart and mediastinal  contours are unchanged. Prominent azygous vein again noted along the right mediastinal silhouette. Increased hilar vascular prominence. No focal consolidation. No pulmonary edema. No pleural effusion. No pneumothorax. No acute osseous abnormality. IMPRESSION: 1. Pulmonary venous congestion. 2.  Aortic Atherosclerosis (ICD10-I70.0). Electronically Signed   By: Iven Finn M.D.   On: 05/25/2021 17:30   ECHOCARDIOGRAM COMPLETE  Result Date: 05/26/2021    ECHOCARDIOGRAM REPORT   Patient Name:   [name] Date of Exam: 05/26/2021 Medical Rec #:  [id number]               Height:       62.0 in Accession #:    [id number]              Weight:       324.0 lb Date of Birth:  31-Mar-1971                BSA:          2.347 m Patient Age:    38 years                BP:           108/56 mmHg Patient Gender: F                       HR:           77 bpm. Exam Location:  Inpatient Procedure: 2D Echo, 3D Echo, Cardiac Doppler, Color Doppler and Strain Analysis Indications:    CHF-Acute Diastolic C78.93  History:        Patient has prior history of Echocardiogram examinations, most                 recent 11/25/2020. CAD, Signs/Symptoms:Bacteremia and Edema; Risk                 Factors:Diabetes and Hypertension. HFpEF, lymphedema, iron                 deficiency anemia requiring IV iron transfusion, HFpEF, moderate                 persistent asthma, chronic hypoxia on 2 L, history of DVT.  Sonographer:    Darlina Sicilian RDCS Referring Phys: [id number] [place] T TU  Sonographer Comments: Suboptimal subcostal window and suboptimal parasternal window. IMPRESSIONS  1. Left ventricular ejection fraction, by estimation, is 60 to 65%. The left ventricle has normal function. The left ventricle has no regional wall motion abnormalities. There is mild concentric left ventricular hypertrophy. Left ventricular diastolic parameters were normal. The average left ventricular global longitudinal strain is -19.3 %. The global longitudinal  strain is normal.  2. Right ventricular systolic  function is normal. The right ventricular size is normal. There is normal pulmonary artery systolic pressure.  3. The mitral valve is grossly normal. No evidence of mitral valve regurgitation.  4. The aortic valve is grossly normal. Aortic valve regurgitation is not visualized. No aortic stenosis is present. FINDINGS  Left Ventricle: Left ventricular ejection fraction, by estimation, is 60 to 65%. The left ventricle has normal function. The left ventricle has no regional wall motion abnormalities. The average left ventricular global longitudinal strain is -19.3 %. The global longitudinal strain is normal. The left ventricular internal cavity size was normal in size. There is mild concentric left ventricular hypertrophy. Left ventricular diastolic parameters were normal. Right Ventricle: The right ventricular size is normal. Right vetricular wall thickness was not well visualized. Right ventricular systolic function is normal. There is normal pulmonary artery systolic pressure. The tricuspid regurgitant velocity is 2.52 m/s, and with an assumed right atrial pressure of 8 mmHg, the estimated right ventricular systolic pressure is 96.7 mmHg. Left Atrium: Left atrial size was normal in size. Right Atrium: Right atrial size was normal in size. Pericardium: There is no evidence of pericardial effusion. Mitral Valve: The mitral valve is grossly normal. No evidence of mitral valve regurgitation. Tricuspid Valve: The tricuspid valve is normal in structure. Tricuspid valve regurgitation is not demonstrated. Aortic Valve: The aortic valve is grossly normal. Aortic valve regurgitation is not visualized. No aortic stenosis is present. Pulmonic Valve: The pulmonic valve was not well visualized. Pulmonic valve regurgitation is not visualized. Aorta: The aortic root and ascending aorta are structurally normal, with no evidence of dilitation. IAS/Shunts: The atrial septum is grossly  normal.  LEFT VENTRICLE PLAX 2D LVIDd:         5.30 cm   Diastology LVIDs:         3.90 cm   LV e' medial:    5.19 cm/s LV PW:         1.30 cm   LV E/e' medial:  13.1 LV IVS:        1.30 cm   LV e' lateral:   8.11 cm/s LVOT diam:     2.20 cm   LV E/e' lateral: 8.4 LV SV:         71 LV SV Index:   30        2D Longitudinal Strain LVOT Area:     3.80 cm  2D Strain GLS Avg:     -19.3 %                           3D Volume EF:                          3D EF:        57 %                          LV EDV:       127 ml                          LV ESV:       55 ml                          LV SV:        72 ml RIGHT VENTRICLE RV S prime:  13.80 cm/s TAPSE (M-mode): 2.3 cm LEFT ATRIUM             Index LA diam:        4.30 cm 1.83 cm/m LA Vol (A2C):   44.3 ml 18.88 ml/m LA Vol (A4C):   42.7 ml 18.20 ml/m LA Biplane Vol: 43.5 ml 18.54 ml/m  AORTIC VALVE LVOT Vmax:   97.30 cm/s LVOT Vmean:  65.000 cm/s LVOT VTI:    0.187 m  AORTA Ao Root diam: 2.90 cm Ao Asc diam:  2.70 cm MITRAL VALVE               TRICUSPID VALVE MV Area (PHT): 3.56 cm    TR Peak grad:   25.4 mmHg MV Decel Time: 213 msec    TR Vmax:        252.00 cm/s MV E velocity: 68.10 cm/s MV Briea Mcenery velocity: 72.00 cm/s  SHUNTS MV E/Eliakim Tendler ratio:  0.95        Systemic VTI:  0.19 m                            Systemic Diam: 2.20 cm Mertie Moores MD Electronically signed by Mertie Moores MD Signature Date/Time: 05/26/2021/1:31:24 PM    Final    VAS [place] LOWER EXTREMITY VENOUS (DVT)  Result Date: 05/27/2021  Lower Venous DVT Study Patient Name:  [name]  Date of Exam:   05/26/2021 Medical Rec #: [id number]                Accession #:    [id number] Date of Birth: April 13, 1971                 Patient Gender: F Patient Age:   78 years Exam Location:  [facility] Procedure:      VAS [place] LOWER EXTREMITY VENOUS (DVT) Referring Phys: Esha Fincher POWELL JR --------------------------------------------------------------------------------  Indications: Edema.  Risk Factors: None  identified. Limitations: Body habitus, poor ultrasound/tissue interface and patient positioning, patient pain tolerance. Comparison Study: No prior studies. Performing Technologist: Oliver Hum RVT  Examination Guidelines: Muriah Harsha complete evaluation includes B-mode imaging, spectral Doppler, color Doppler, and power Doppler as needed of all accessible portions of each vessel. Bilateral testing is considered an integral part of Aleida Crandell complete examination. Limited examinations for reoccurring indications may be performed as noted. The reflux portion of the exam is performed with the patient in reverse Trendelenburg.  +---------+---------------+---------+-----------+----------+-------------------+  RIGHT     Compressibility Phasicity Spontaneity Properties Thrombus Aging       +---------+---------------+---------+-----------+----------+-------------------+  CFV       Full            Yes       Yes                                         +---------+---------------+---------+-----------+----------+-------------------+  SFJ       Full                                                                  +---------+---------------+---------+-----------+----------+-------------------+  FV Prox   Full                                                                  +---------+---------------+---------+-----------+----------+-------------------+  FV Mid                    Yes       Yes                                         +---------+---------------+---------+-----------+----------+-------------------+  FV Distal                 Yes       Yes                                         +---------+---------------+---------+-----------+----------+-------------------+  PFV       Full                                                                  +---------+---------------+---------+-----------+----------+-------------------+  POP       Full            Yes       Yes                                          +---------+---------------+---------+-----------+----------+-------------------+  PTV       Full                                                                  +---------+---------------+---------+-----------+----------+-------------------+  PERO                                                       Not well visualized  +---------+---------------+---------+-----------+----------+-------------------+   +---------+---------------+---------+-----------+----------+-------------------+  LEFT      Compressibility Phasicity Spontaneity Properties Thrombus Aging       +---------+---------------+---------+-----------+----------+-------------------+  CFV       Full            Yes       Yes                                         +---------+---------------+---------+-----------+----------+-------------------+  SFJ       Full                                                                  +---------+---------------+---------+-----------+----------+-------------------+  FV Prox   Full                                                                  +---------+---------------+---------+-----------+----------+-------------------+  FV Mid                    Yes       Yes                                         +---------+---------------+---------+-----------+----------+-------------------+  FV Distal                 Yes       Yes                                         +---------+---------------+---------+-----------+----------+-------------------+  PFV       Full                                                                  +---------+---------------+---------+-----------+----------+-------------------+  POP       Full            Yes       Yes                                         +---------+---------------+---------+-----------+----------+-------------------+  PTV       Full                                                                  +---------+---------------+---------+-----------+----------+-------------------+   PERO                                                       Not well visualized  +---------+---------------+---------+-----------+----------+-------------------+     Summary: RIGHT: - There is no evidence of deep vein thrombosis in the lower extremity. However, portions of this examination were limited- see technologist comments above.  - No cystic structure found in the popliteal fossa.  LEFT: - There is no evidence of deep vein thrombosis in the lower extremity. However, portions of this examination were limited- see technologist comments above.  - No cystic structure found in the popliteal fossa.  *See table(s) above for measurements and observations. Electronically signed by Servando Snare MD on 05/27/2021 at 9:28:46 AM.    Final         Scheduled Meds:  apixaban  5 mg Oral BID   carvedilol  25 mg Oral BID WC   furosemide  80 mg Intravenous TID   potassium chloride  20 mEq Oral Daily   Continuous Infusions:   LOS: 2 days    Time spent: over 30 min    Fayrene Helper, MD [facility]   To contact the attending provider between 7A-7P or the covering provider during  after hours 7P-7A, please log into the web site [email or web address] and access using universal [place] password for that web site. If you do not have the password, please call the hospital operator.  05/27/2021, 2:59 PM

## 2021-05-27 NOTE — Assessment & Plan Note (Addendum)
S/p removal by IR 1/30

## 2021-05-28 ENCOUNTER — Inpatient Hospital Stay (HOSPITAL_COMMUNITY): Payer: Medicaid Other

## 2021-05-28 DIAGNOSIS — F32A Depression, unspecified: Secondary | ICD-10-CM

## 2021-05-28 DIAGNOSIS — Z452 Encounter for adjustment and management of vascular access device: Secondary | ICD-10-CM | POA: Diagnosis not present

## 2021-05-28 DIAGNOSIS — I5031 Acute diastolic (congestive) heart failure: Secondary | ICD-10-CM | POA: Diagnosis not present

## 2021-05-28 HISTORY — PX: IR REMOVAL TUN ACCESS W/ PORT W/O FL MOD SED: IMG2290

## 2021-05-28 LAB — C-REACTIVE PROTEIN: CRP: 8 mg/dL — ABNORMAL HIGH (ref <1.0)

## 2021-05-28 LAB — COMPREHENSIVE METABOLIC PANEL
ALT: 12 U/L (ref 0–44)
AST: 27 U/L (ref 15–41)
Albumin: 3.5 g/dL (ref 3.5–5.0)
Alkaline Phosphatase: 57 U/L (ref 38–126)
Anion gap: 8 (ref 5–15)
BUN: 19 mg/dL (ref 6–20)
CO2: 35 mmol/L — ABNORMAL HIGH (ref 22–32)
Calcium: 9.2 mg/dL (ref 8.9–10.3)
Chloride: 92 mmol/L — ABNORMAL LOW (ref 98–111)
Creatinine, Ser: 0.87 mg/dL (ref 0.44–1.00)
GFR, Estimated: >60 mL/min (ref >60)
Glucose, Bld: 137 mg/dL — ABNORMAL HIGH (ref 70–99)
Potassium: 4.2 mmol/L (ref 3.5–5.1)
Sodium: 135 mmol/L (ref 135–145)
Total Bilirubin: 1.3 mg/dL — ABNORMAL HIGH (ref 0.3–1.2)
Total Protein: 7.9 g/dL (ref 6.5–8.1)

## 2021-05-28 LAB — CBC WITH DIFFERENTIAL/PLATELET
Abs Immature Granulocytes: 0.01 10*3/uL (ref 0.00–0.07)
Basophils Absolute: 0 10*3/uL (ref 0.0–0.1)
Basophils Relative: 0 %
Eosinophils Absolute: 0.1 10*3/uL (ref 0.0–0.5)
Eosinophils Relative: 3 %
HCT: 39 % (ref 36.0–46.0)
Hemoglobin: 11.6 g/dL — ABNORMAL LOW (ref 12.0–15.0)
Immature Granulocytes: 0 %
Lymphocytes Relative: 12 %
Lymphs Abs: 0.6 10*3/uL — ABNORMAL LOW (ref 0.7–4.0)
MCH: 26.9 pg (ref 26.0–34.0)
MCHC: 29.7 g/dL — ABNORMAL LOW (ref 30.0–36.0)
MCV: 90.5 fL (ref 80.0–100.0)
Monocytes Absolute: 0.6 10*3/uL (ref 0.1–1.0)
Monocytes Relative: 13 %
Neutro Abs: 3.4 10*3/uL (ref 1.7–7.7)
Neutrophils Relative %: 72 %
Platelets: 129 10*3/uL — ABNORMAL LOW (ref 150–400)
RBC: 4.31 MIL/uL (ref 3.87–5.11)
RDW: 15.8 % — ABNORMAL HIGH (ref 11.5–15.5)
WBC: 4.7 10*3/uL (ref 4.0–10.5)
nRBC: 0 % (ref 0.0–0.2)

## 2021-05-28 LAB — SEDIMENTATION RATE: Sed Rate: 56 mm/hr — ABNORMAL HIGH (ref 0–22)

## 2021-05-28 MED ORDER — AMLODIPINE BESYLATE 5 MG PO TABS
2.5000 mg | ORAL_TABLET | Freq: Every day | ORAL | Status: DC
  Administered 2021-05-28 – 2021-06-01 (×5): 2.5 mg via ORAL
  Filled 2021-05-28 (×5): qty 1

## 2021-05-28 MED ORDER — FENTANYL CITRATE (PF) 100 MCG/2ML IJ SOLN
INTRAMUSCULAR | Status: AC | PRN
Start: 2021-05-28 — End: 2021-05-28
  Administered 2021-05-28: 50 ug via INTRAVENOUS

## 2021-05-28 MED ORDER — PAROXETINE HCL 10 MG PO TABS
10.0000 mg | ORAL_TABLET | Freq: Every day | ORAL | Status: DC
  Administered 2021-05-29 – 2021-06-01 (×4): 10 mg via ORAL
  Filled 2021-05-28 (×5): qty 1

## 2021-05-28 MED ORDER — FENTANYL CITRATE (PF) 100 MCG/2ML IJ SOLN
INTRAMUSCULAR | Status: AC
  Filled 2021-05-28: qty 2

## 2021-05-28 MED ORDER — MOMETASONE FURO-FORMOTEROL FUM 200-5 MCG/ACT IN AERO
2.0000 | INHALATION_SPRAY | Freq: Every day | RESPIRATORY_TRACT | Status: DC
  Administered 2021-05-29 – 2021-06-01 (×4): 2 via RESPIRATORY_TRACT
  Filled 2021-05-28: qty 8.8

## 2021-05-28 MED ORDER — PANTOPRAZOLE SODIUM 40 MG PO TBEC
40.0000 mg | DELAYED_RELEASE_TABLET | Freq: Every day | ORAL | Status: DC
  Administered 2021-05-29 – 2021-06-01 (×4): 40 mg via ORAL
  Filled 2021-05-28 (×4): qty 1

## 2021-05-28 MED ORDER — LIDOCAINE-EPINEPHRINE 1 %-1:100000 IJ SOLN
INTRAMUSCULAR | Status: AC | PRN
  Administered 2021-05-28: 10 mL via INTRADERMAL

## 2021-05-28 MED ORDER — SPIRONOLACTONE 25 MG PO TABS
25.0000 mg | ORAL_TABLET | Freq: Every day | ORAL | Status: DC
  Administered 2021-05-28 – 2021-06-01 (×5): 25 mg via ORAL
  Filled 2021-05-28 (×5): qty 1

## 2021-05-28 MED ORDER — VITAMIN B-12 1000 MCG PO TABS
1000.0000 ug | ORAL_TABLET | Freq: Every day | ORAL | Status: DC
  Administered 2021-05-29 – 2021-06-01 (×4): 1000 ug via ORAL
  Filled 2021-05-28 (×4): qty 1

## 2021-05-28 MED ORDER — ALBUTEROL SULFATE (2.5 MG/3ML) 0.083% IN NEBU: 2.5000 mg | INHALATION_SOLUTION | Freq: Four times a day (QID) | RESPIRATORY_TRACT | Status: DC | PRN

## 2021-05-28 MED ORDER — ATORVASTATIN CALCIUM 40 MG PO TABS
40.0000 mg | ORAL_TABLET | Freq: Every day | ORAL | Status: DC
  Administered 2021-05-29 – 2021-06-01 (×4): 40 mg via ORAL
  Filled 2021-05-28 (×4): qty 1

## 2021-05-28 MED ORDER — LIDOCAINE-EPINEPHRINE 1 %-1:100000 IJ SOLN
INTRAMUSCULAR | Status: AC
  Filled 2021-05-28: qty 1

## 2021-05-28 NOTE — Progress Notes (Addendum)
PROGRESS NOTE    [name]  ZOX:[id number] DOB: 07/29/1970 DOA: 05/25/2021 PCP: Ladell Pier, MD  Chief Complaint  Patient presents with   Leg Pain    Brief Narrative:   [name] is [name] 51 y.o. female with medical history significant for lymphedema, iron deficiency anemia requiring IV iron transfusion, HFpEF, moderate persistent asthma, chronic hypoxia on 2 L, hypertension, history of DVT on Eliquis who presents with concerns of worsening lower extremity edema and DOE. She's been admitted for presumed HF exacerbation.    Assessment & Plan:   Principal Problem:   Acute CHF (congestive heart failure) (HCC) Active Problems:   Acute on chronic respiratory failure with hypoxia (HCC)   Bilateral lower extremity edema   S/P insertion of IVC (inferior vena caval) filter   History of DVT (deep vein thrombosis)   Iron deficiency anemia due to chronic blood loss   Essential hypertension   Morbid obesity with BMI of 50.0-59.9, adult ([place])   Encounter for care related to Port-Demetri Goshert-Cath   Depression   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 (HCC)  Assessment and Plan: * Acute CHF (congestive heart failure) (HCC) Worsening LE edema, DOE Presumed HF exacerbation CXR with pulm venous congestion, bilateral LE edema Strict I/O, daily weights (166.9 lbs on 1/29) Lasix 80 PO daily at home Continue lasix 80 mg to TID  Developing alkalosis, depending on exam, consider acetazolamide vs backing off on diuresis (continues to be overloaded at this time) Echo with EF 60-65%, mild LVH (see report)  Acute on chronic respiratory failure with hypoxia ([place]) Chronically on 2 L, now with worsening DOE Treating for HF as above Will follow d dimer (wnl), echo (as above) Consider additional imaging as indicated  Bilateral lower extremity edema R>L lower extremity edema which is different per patient Follow LE [place] Continue eliquis for  now D dimer normal, LE [place] without DVT (limited) Continues to have significant LE pain which I think is related to her overload and lymphedema - she notes this comes/goes and is somewhat of [place] chronic recurrent issues - no overt signs of infection   History of DVT (deep vein thrombosis) On eliquis S/p IVC filter in 2017 Per [name] 01/2021 note, hx of at least 10 clotting events, denied recurrent clots since eliquis - PCP managing anticoagulation  Iron deficiency anemia due to chronic blood loss- (present on admission) Follows with [name] trend  Morbid obesity with BMI of 50.0-59.9, adult ([place]) noted  Essential hypertension Continue to hold valsartan with aggressive diuresis Resume spironolactone Continue coreg, amlodipin  Depression paxil  Encounter for care related to Port-Aleera Gilcrease-Cath S/p removal by IR 1/30   DVT prophylaxis: eliquis Code Status: full Family Communication: none Disposition:   Status is: Inpatient  Remains inpatient appropriate because: need for IV diuresis       Consultants:  none  Procedures:  none  Antimicrobials:  Anti-infectives (From admission, onward)    None       Subjective: Swelling improved Still pain  Objective: Vitals:   05/28/21 0530 05/28/21 0917 05/28/21 1241 05/28/21 1727  BP: (!) 116/51 (!) 110/54 109/79 106/81  Pulse: 82 80 82 84  Resp: 17  18 18   Temp: 97.7 F (36.5 C)  97.8 F (36.6 C) 98.2 F (36.8 C)  TempSrc: Oral  Oral Oral  SpO2: 95% 99% 97% 97%  Weight: (!) 148.8 kg     Height:  Intake/Output Summary (Last 24 hours) at 05/28/2021 1935 Last data filed at 05/28/2021 1230 Gross per 24 hour  Intake 240 ml  Output 2600 ml  Net -2360 ml   Filed Weights   05/26/21 1419 05/27/21 0426 05/28/21 0530  Weight: (!) 166.7 kg (!) 166.9 kg (!) 148.8 kg    Examination:  General: No acute distress. Cardiovascular: RRR Lungs: unlabored Abdomen: Soft, nontender, nondistended  Neurological: Alert  and oriented 3. Moves all extremities 4 . Cranial nerves II through XII grossly intact. Skin: Warm and dry. No rashes or lesions. Extremities: bilateral R>L edema, TTP, palpable DP pulses   Data Reviewed: I have personally reviewed following labs and imaging studies  CBC: Recent Labs  Lab 05/25/21 2145 05/26/21 0445 05/27/21 0355 05/28/21 0851  WBC 8.7 11.5* 6.0 4.7  NEUTROABS 8.1*  --  4.6 3.4  HGB 10.9* 10.1* 9.9* 11.6*  HCT 37.8 34.7* 33.5* 39.0  MCV 93.1 94.3 93.3 90.5  PLT 140* 136* 133* 129*    Basic Metabolic Panel: Recent Labs  Lab 05/25/21 2145 05/26/21 0445 05/27/21 0355 05/28/21 0851  NA 133* 135 134* 135  K 3.9 4.1 3.7 4.2  CL 96* 97* 96* 92*  CO2 29 30 31  35*  GLUCOSE 132* 140* 134* 137*  BUN 12 13 23* 19  CREATININE 0.86 0.87 1.10* 0.87  CALCIUM 9.0 8.9 8.7* 9.2  MG  --   --  2.0  --   PHOS  --   --  4.7*  --     GFR: Estimated Creatinine Clearance: 107 mL/min (by C-G formula based on SCr of 0.87 mg/dL).  Liver Function Tests: Recent Labs  Lab 05/25/21 2145 05/27/21 0355 05/28/21 0851  AST 15 15 27   ALT 12 10 12   ALKPHOS 63 55 57  BILITOT 1.2 1.6* 1.3*  PROT 8.1 7.2 7.9  ALBUMIN 4.0 3.4* 3.5    CBG: Recent Labs  Lab 05/25/21 1705 05/25/21 1825 05/25/21 2139  GLUCAP 77 98 132*     Recent Results (from the past 240 hour(s))  Resp Panel by RT-PCR (Flu Humbert Morozov&B, Covid) Nasopharyngeal Swab     Status: None   Collection Time: 05/25/21  9:55 PM   Specimen: Nasopharyngeal Swab; Nasopharyngeal(NP) swabs in vial transport medium  Result Value Ref Range Status   SARS Coronavirus 2 by RT PCR NEGATIVE NEGATIVE Final    Comment: (NOTE) SARS-CoV-2 target nucleic acids are NOT DETECTED.  The SARS-CoV-2 RNA is generally detectable in upper respiratory specimens during the acute phase of infection. The lowest concentration of SARS-CoV-2 viral copies this assay can detect is 138 copies/mL. Amritha Yorke negative result does not preclude  SARS-Cov-2 infection and should not be used as the sole basis for treatment or other patient management decisions. Rio Kidane negative result may occur with  improper specimen collection/handling, submission of specimen other than nasopharyngeal swab, presence of viral mutation(s) within the areas targeted by this assay, and inadequate number of viral copies(<138 copies/mL). Morley Gaumer negative result must be combined with clinical observations, patient history, and epidemiological information. The expected result is Negative.  Fact Sheet for Patients:  EntrepreneurPulse.com.au  Fact Sheet for Healthcare Providers:  IncredibleEmployment.be  This test is no t yet approved or cleared by the [place] FDA and  has been authorized for detection and/or diagnosis of SARS-CoV-2 by FDA under an Emergency Use Authorization (EUA). This EUA will remain  in effect (meaning this test can be used) for the duration of the COVID-19 declaration under Section 564(b)(1) of the  Act, 21 U.S.C.section 360bbb-3(b)(1), unless the authorization is terminated  or revoked sooner.       Influenza Alexiz Sustaita by PCR NEGATIVE NEGATIVE Final   Influenza B by PCR NEGATIVE NEGATIVE Final    Comment: (NOTE) The Xpert Xpress SARS-CoV-2/FLU/RSV plus assay is intended as an aid in the diagnosis of influenza from Nasopharyngeal swab specimens and should not be used as Abhay Godbolt sole basis for treatment. Nasal washings and aspirates are unacceptable for Xpert Xpress SARS-CoV-2/FLU/RSV testing.  Fact Sheet for Patients: EntrepreneurPulse.com.au  Fact Sheet for Healthcare Providers: IncredibleEmployment.be  This test is not yet approved or cleared by the [place] FDA and has been authorized for detection and/or diagnosis of SARS-CoV-2 by FDA under an Emergency Use Authorization (EUA). This EUA will remain in effect (meaning this test can be used) for the duration of  the COVID-19 declaration under Section 564(b)(1) of the Act, 21 U.S.C. section 360bbb-3(b)(1), unless the authorization is terminated or revoked.  Performed at [facility], [place] [address], [place], [place] [address]          Radiology Studies: IR REMOVAL TUN ACCESS W/ PORT W/O FL MOD SED  Result Date: 05/28/2021 INDICATION: 51 year old female with Massai Hankerson port catheter placed at an outside institution approximately 7 years previously. The port catheter is no longer working. She presents for port removal. No clinical concern for infection. EXAM: REMOVAL RIGHT IJ VEIN PORT-Richard Holz-CATH MEDICATIONS: None ANESTHESIA/SEDATION: None FLUOROSCOPY TIME:  Less than 1 mGy COMPLICATIONS: None immediate. PROCEDURE: Informed written consent was obtained from the patient after Shira Bobst thorough discussion of the procedural risks, benefits and alternatives. All questions were addressed. Maximal Sterile Barrier Technique was utilized including caps, mask, sterile gowns, sterile gloves, sterile drape, hand hygiene and skin antiseptic. Arslan Kier timeout was performed prior to the initiation of the procedure. The right chest was prepped and draped in Edsel Shives sterile fashion. Lidocaine was utilized for local anesthesia. An incision was made over the previously healed surgical incision. Utilizing blunt dissection, the port catheter and reservoir were removed from the underlying subcutaneous tissue in their entirety. The reservoir was transected from the catheter tubing. Mykayla Brinton glidewire was then advanced through the catheter tubing and deep into the inferior vena cava. The port catheter tubing was then carefully removed over the wire and remained intact. The wire was removed. Securing sutures were also removed. The pocket was irrigated with Shelia Magallon copious amount of sterile normal saline. The pocket was closed with interrupted 3-0 Vicryl stitches. The subcutaneous tissue was closed with 3-0 Vicryl interrupted subcutaneous stitches. Herman Fiero 4-0 Vicryl  running subcuticular stitch was utilized to approximate the skin. Dermabond was applied. IMPRESSION: Successful right IJ vein Port-Daylani Deblois-Cath removal. Electronically Signed   By: Jacqulynn Cadet M.D.   On: 05/28/2021 17:26        Scheduled Meds:  amLODipine  2.5 mg Oral Daily   apixaban  5 mg Oral BID   [START ON 05/29/2021] atorvastatin  40 mg Oral Daily   carvedilol  25 mg Oral BID WC   furosemide  80 mg Intravenous TID   [START ON 05/29/2021] mometasone-formoterol  2 puff Inhalation Daily   [START ON 05/29/2021] pantoprazole  40 mg Oral Daily   [START ON 05/29/2021] PARoxetine  10 mg Oral Daily   potassium chloride  20 mEq Oral Daily   spironolactone  25 mg Oral Daily   [START ON 05/29/2021] vitamin B-12  1,000 mcg Oral Daily   Continuous Infusions:   LOS: 3 days    Time spent:  over 30 min    Fayrene Helper, MD [facility]   To contact the attending provider between 7A-7P or the covering provider during after hours 7P-7A, please log into the web site [email or web address] and access using universal [place] password for that web site. If you do not have the password, please call the hospital operator.  05/28/2021, 7:35 PM

## 2021-05-28 NOTE — TOC Progression Note (Signed)
Transition of Care [facility]) - Progression Note    Patient Details  Name: [name] MRN: [id number] Date of Birth: 05-27-1970  [facility]) CM/SW Contact  Purcell Mouton, RN Phone Number: 05/28/2021, 3:54 PM  Clinical Narrative:    Spoke with pt concerning CHF screening. Pt will make a follow up appointment with her PCP and continue to check her wt at home.    Expected Discharge Plan: Home/Self Care Barriers to Discharge: No Barriers Identified  Expected Discharge Plan and Services Expected Discharge Plan: Home/Self Care       Living arrangements for the past 2 months: Single Family Home                                       Social Determinants of Health (SDOH) Interventions    Readmission Risk Interventions No flowsheet data found.

## 2021-05-28 NOTE — Procedures (Signed)
Interventional Radiology Procedure Note  Procedure: Successful removal of portacatheter.   Complications: None  Estimated Blood Loss: None  Recommendations: - Routine wound care   Signed,  Criselda Peaches, MD

## 2021-05-28 NOTE — Evaluation (Signed)
Physical Therapy Evaluation Patient Details Name: [name] MRN: [id number] DOB: [birth year] Today's Date: 05/28/2021  History of Present Illness  51 yo female admitted with CHF exac. Hx of obesity, HF, DVT, asthma, anemia, lymphedema, CAD, OSA  Clinical Impression  On eval, pt was Min A for mobility. She walked ~15 feet around the room. Near LOB x 2 with use of cane. Pt reports she doesn't walk much at home (bare minimum). Will plan to follow and progress activity as tolerated. She is interested in HHPT f/u if that is a possibility. She has a RW she can use if needed.        Recommendations for follow up therapy are one component of a multi-disciplinary discharge planning process, led by the attending physician.  Recommendations may be updated based on patient status, additional functional criteria and insurance authorization.  Follow Up Recommendations Home health PT    Assistance Recommended at Discharge PRN  Patient can return home with the following  A little help with walking and/or transfers;Help with stairs or ramp for entrance;Assist for transportation    Equipment Recommendations None recommended by PT  Recommendations for Other Services       Functional Status Assessment Patient has had a recent decline in their functional status and demonstrates the ability to make significant improvements in function in a reasonable and predictable amount of time.     Precautions / Restrictions Precautions Precautions: Fall Restrictions Weight Bearing Restrictions: No      Mobility  Bed Mobility               General bed mobility comments: oob in recliner    Transfers Overall transfer level: Needs assistance Equipment used: Rolling walker (2 wheels) Transfers: Sit to/from Stand Sit to Stand: Min guard           General transfer comment: Min guard for safety.    Ambulation/Gait Ambulation/Gait assistance: Min assist Gait Distance (Feet): 15  Feet Assistive device: Straight cane Gait Pattern/deviations: Step-through pattern, Decreased stride length       General Gait Details: Intermittent unsteadiness with near LOB x 2 with cane use. Walked across Dispensing optician [place] (Stroke Patients Only)       Balance Overall balance assessment: Needs assistance         Standing balance support: Single extremity supported, During functional activity Standing balance-Leahy Scale: Poor                               Pertinent Vitals/Pain Pain Assessment Pain Assessment: 0-10 Pain Score: 9  Pain Location: bil LEs Pain Descriptors / Indicators: Discomfort, Sore Pain Intervention(s): Monitored during session    Home Living Family/patient expects to be discharged to:: Private residence Living Arrangements: Parent Available Help at Discharge: Family;Available 24 hours/day Type of Home: Apartment Home Access: Level entry       Home Layout: One level Home Equipment: Conservation officer, nature (2 wheels);Cane - single point      Prior Function               Mobility Comments: uses cane for ambulation       Hand Dominance        Extremity/Trunk Assessment   Upper Extremity Assessment Upper Extremity Assessment: Overall WFL for tasks assessed    Lower Extremity Assessment Lower Extremity Assessment: Generalized weakness  Cervical / Trunk Assessment Cervical / Trunk Assessment: Normal  Communication   Communication: No difficulties  Cognition Arousal/Alertness: Awake/alert Behavior During Therapy: WFL for tasks assessed/performed Overall Cognitive Status: Within Functional Limits for tasks assessed                                          General Comments      Exercises     Assessment/Plan    PT Assessment Patient needs continued PT services  PT Problem List Decreased strength;Decreased mobility;Decreased activity  tolerance;Decreased balance;Decreased knowledge of use of DME;Obesity;Pain       PT Treatment Interventions DME instruction;Gait training;Therapeutic activities;Therapeutic exercise;Patient/family education;Functional mobility training;Balance training    PT Goals (Current goals can be found in the Care Plan section)  Acute Rehab PT Goals Patient Stated Goal: to feel better PT Goal Formulation: With patient Time For Goal Achievement: 06/11/21 Potential to Achieve Goals: Good    Frequency Min 3X/week     Co-evaluation               AM-PAC PT "6 Clicks" Mobility  Outcome Measure Help needed turning from your back to your side while in a flat bed without using bedrails?: A Little Help needed moving from lying on your back to sitting on the side of a flat bed without using bedrails?: A Little Help needed moving to and from a bed to a chair (including a wheelchair)?: A Little Help needed standing up from a chair using your arms (e.g., wheelchair or bedside chair)?: A Little Help needed to walk in hospital room?: A Little Help needed climbing 3-5 steps with a railing? : Total 6 Click Score: 16    End of Session   Activity Tolerance: Patient tolerated treatment well Patient left: in chair;with call bell/phone within reach   PT Visit Diagnosis: Unsteadiness on feet (R26.81);Muscle weakness (generalized) (M62.81)    Time: 1937-9024 PT Time Calculation (min) (ACUTE ONLY): 13 min   Charges:   PT Evaluation $PT Eval Moderate Complexity: 1 Mod            Doreatha Massed, PT Acute Rehabilitation  Office: [phone] Pager: [phone]

## 2021-05-28 NOTE — Assessment & Plan Note (Signed)
-  paxil

## 2021-05-29 DIAGNOSIS — I5031 Acute diastolic (congestive) heart failure: Secondary | ICD-10-CM | POA: Diagnosis not present

## 2021-05-29 LAB — CBC WITH DIFFERENTIAL/PLATELET
Abs Immature Granulocytes: 0.02 10*3/uL (ref 0.00–0.07)
Basophils Absolute: 0 10*3/uL (ref 0.0–0.1)
Basophils Relative: 0 %
Eosinophils Absolute: 0.1 10*3/uL (ref 0.0–0.5)
Eosinophils Relative: 1 %
HCT: 37.2 % (ref 36.0–46.0)
Hemoglobin: 11.3 g/dL — ABNORMAL LOW (ref 12.0–15.0)
Immature Granulocytes: 0 %
Lymphocytes Relative: 12 %
Lymphs Abs: 0.7 10*3/uL (ref 0.7–4.0)
MCH: 27 pg (ref 26.0–34.0)
MCHC: 30.4 g/dL (ref 30.0–36.0)
MCV: 89 fL (ref 80.0–100.0)
Monocytes Absolute: 0.7 10*3/uL (ref 0.1–1.0)
Monocytes Relative: 11 %
Neutro Abs: 4.4 10*3/uL (ref 1.7–7.7)
Neutrophils Relative %: 76 %
Platelets: 140 10*3/uL — ABNORMAL LOW (ref 150–400)
RBC: 4.18 MIL/uL (ref 3.87–5.11)
RDW: 15.6 % — ABNORMAL HIGH (ref 11.5–15.5)
WBC: 5.8 10*3/uL (ref 4.0–10.5)
nRBC: 0 % (ref 0.0–0.2)

## 2021-05-29 LAB — COMPREHENSIVE METABOLIC PANEL
ALT: 10 U/L (ref 0–44)
AST: 19 U/L (ref 15–41)
Albumin: 3.5 g/dL (ref 3.5–5.0)
Alkaline Phosphatase: 55 U/L (ref 38–126)
Anion gap: 11 (ref 5–15)
BUN: UNDETERMINED mg/dL (ref 6–20)
CO2: 32 mmol/L (ref 22–32)
Calcium: 9.4 mg/dL (ref 8.9–10.3)
Chloride: 89 mmol/L — ABNORMAL LOW (ref 98–111)
Creatinine, Ser: 0.94 mg/dL (ref 0.44–1.00)
GFR, Estimated: >60 mL/min (ref >60)
Glucose, Bld: 123 mg/dL — ABNORMAL HIGH (ref 70–99)
Potassium: 4 mmol/L (ref 3.5–5.1)
Sodium: 132 mmol/L — ABNORMAL LOW (ref 135–145)
Total Bilirubin: 0.8 mg/dL (ref 0.3–1.2)
Total Protein: 7.9 g/dL (ref 6.5–8.1)

## 2021-05-29 LAB — BUN: BUN: 20 mg/dL (ref 6–20)

## 2021-05-29 LAB — C-REACTIVE PROTEIN: CRP: 6.3 mg/dL — ABNORMAL HIGH (ref <1.0)

## 2021-05-29 LAB — PHOSPHORUS: Phosphorus: 3.8 mg/dL (ref 2.5–4.6)

## 2021-05-29 LAB — SEDIMENTATION RATE: Sed Rate: 53 mm/hr — ABNORMAL HIGH (ref 0–22)

## 2021-05-29 LAB — MAGNESIUM: Magnesium: 1.9 mg/dL (ref 1.7–2.4)

## 2021-05-29 MED ORDER — FUROSEMIDE 10 MG/ML IJ SOLN
80.0000 mg | Freq: Two times a day (BID) | INTRAMUSCULAR | Status: DC
  Administered 2021-05-30 – 2021-06-01 (×5): 80 mg via INTRAVENOUS
  Filled 2021-05-29 (×5): qty 8

## 2021-05-29 NOTE — Progress Notes (Signed)
PROGRESS NOTE    [name]  NWG:[id number] DOB: 09-25-1970 DOA: 05/25/2021 PCP: Ladell Pier, MD  Chief Complaint  Patient presents with   Leg Pain    Brief Narrative:   [name] is [name] 51 y.o. female with medical history significant for lymphedema, iron deficiency anemia requiring IV iron transfusion, HFpEF, moderate persistent asthma, chronic hypoxia on 2 L, hypertension, history of DVT on Eliquis who presents with concerns of worsening lower extremity edema and DOE. She's been admitted for presumed HF exacerbation.  See below for additional details    Assessment & Plan:   Principal Problem:   Acute CHF (congestive heart failure) (HCC) Active Problems:   Acute on chronic respiratory failure with hypoxia (HCC)   Bilateral lower extremity edema   S/P insertion of IVC (inferior vena caval) filter   History of DVT (deep vein thrombosis)   Iron deficiency anemia due to chronic blood loss   Essential hypertension   Morbid obesity with BMI of 50.0-59.9, adult ([place])   Encounter for care related to Port-L[place] Ventresca-Cath   Depression   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 (HCC)  Assessment and Plan: * Acute CHF (congestive heart failure) (HCC) Worsening LE edema, DOE Presumed HF exacerbation CXR with pulm venous congestion, bilateral LE edema Strict I/O, daily weights (166.9 lbs on 1/29) - net negative 9 L, weights likely inaccurate Lasix 80 PO daily at home Continue lasix 80 mg to TID   (continues to be overloaded at this time, though she's improving, will change tomorrows lasix dose to BID and allow next provider to follow up frequency and timing of transition to PO) Echo with EF 60-65%, mild LVH (see report)  Acute on chronic respiratory failure with hypoxia ([place]) Chronically on 2 L Now has been weaned to RA Treating for HF as above Will follow d dimer (wnl), echo (as above) Consider additional imaging as  indicated  Bilateral lower extremity edema R>L lower extremity edema and pain Seems to be getting better with diuresis, but persistent pain Continue eliquis for now D dimer normal, LE [place] without DVT (limited) Continues to have significant LE pain which I think is related to her overload and lymphedema - she notes this comes/goes and is somewhat of Edelmiro Innocent chronic recurrent issues - no overt signs of infection - not sure if any additional imaging would be helpful at this point   History of DVT (deep vein thrombosis) On eliquis S/p IVC filter in 2017 Per [name] 01/2021 note, hx of at least 10 clotting events, denied recurrent clots since eliquis - PCP managing anticoagulation  Iron deficiency anemia due to chronic blood loss- (present on admission) Follows with [name] trend  Morbid obesity with BMI of 50.0-59.9, adult ([place]) noted  Essential hypertension Continue to hold valsartan with aggressive diuresis Resume spironolactone Continue coreg, amlodipin  Depression paxil  Encounter for care related to Port-Doneisha Ivey-Cath S/p removal by IR 1/30   DVT prophylaxis: eliquis Code Status: full Family Communication: none Disposition:   Status is: Inpatient  Remains inpatient appropriate because: need for IV diuresis       Consultants:  none  Procedures:  none  Antimicrobials:  Anti-infectives (From admission, onward)    None       Subjective: Continued LE pain  Objective: Vitals:   05/29/21 0452 05/29/21 0856 05/29/21 0952 05/29/21 1233  BP:   105/77 117/82  Pulse:   91 79  Resp:    18  Temp:    97.8 F (36.6 C)  TempSrc:    Oral  SpO2:  95%  99%  Weight: (!) 152.6 kg     Height:        Intake/Output Summary (Last 24 hours) at 05/29/2021 2000 Last data filed at 05/29/2021 1842 Gross per 24 hour  Intake 1280 ml  Output 2800 ml  Net -1520 ml    Filed Weights   05/27/21 0426 05/28/21 0530 05/29/21 0452  Weight: (!) 166.9 kg (!) 148.8 kg (!) 152.6 kg     Examination:  General: No acute distress. Cardiovascular: RRR Lungs: unlabored Abdomen: Soft, nontender, nondistended  Neurological: Alert and oriented 3. Moves all extremities 4 . Cranial nerves II through XII grossly intact. Skin: Warm and dry. No rashes or lesions. Extremities: bilateral LE edema, improved, but still TTP    Data Reviewed: I have personally reviewed following labs and imaging studies  CBC: Recent Labs  Lab 05/25/21 2145 05/26/21 0445 05/27/21 0355 05/28/21 0851 05/29/21 0335  WBC 8.7 11.5* 6.0 4.7 5.8  NEUTROABS 8.1*  --  4.6 3.4 4.4  HGB 10.9* 10.1* 9.9* 11.6* 11.3*  HCT 37.8 34.7* 33.5* 39.0 37.2  MCV 93.1 94.3 93.3 90.5 89.0  PLT 140* 136* 133* 129* 140*     Basic Metabolic Panel: Recent Labs  Lab 05/25/21 2145 05/26/21 0445 05/27/21 0355 05/28/21 0851 05/29/21 0335 05/29/21 0520  NA 133* 135 134* 135 132*  --   K 3.9 4.1 3.7 4.2 4.0  --   CL 96* 97* 96* 92* 89*  --   CO2 29 30 31  35* 32  --   GLUCOSE 132* 140* 134* 137* 123*  --   BUN 12 13 23* 19 QUANTITY NOT SUFFICIENT, UNABLE TO PERFORM TEST 20  CREATININE 0.86 0.87 1.10* 0.87 0.94  --   CALCIUM 9.0 8.9 8.7* 9.2 9.4  --   MG  --   --  2.0  --  1.9  --   PHOS  --   --  4.7*  --  3.8  --      GFR: Estimated Creatinine Clearance: 100.7 mL/min (by C-G formula based on SCr of 0.94 mg/dL).  Liver Function Tests: Recent Labs  Lab 05/25/21 2145 05/27/21 0355 05/28/21 0851 05/29/21 0335  AST 15 15 27 19   ALT 12 10 12 10   ALKPHOS 63 55 57 55  BILITOT 1.2 1.6* 1.3* 0.8  PROT 8.1 7.2 7.9 7.9  ALBUMIN 4.0 3.4* 3.5 3.5     CBG: Recent Labs  Lab 05/25/21 1705 05/25/21 1825 05/25/21 2139  GLUCAP 77 98 132*      Recent Results (from the past 240 hour(s))  Resp Panel by RT-PCR (Flu Cassidy Tabet&B, Covid) Nasopharyngeal Swab     Status: None   Collection Time: 05/25/21  9:55 PM   Specimen: Nasopharyngeal Swab; Nasopharyngeal(NP) swabs in vial transport medium  Result Value Ref  Range Status   SARS Coronavirus 2 by RT PCR NEGATIVE NEGATIVE Final    Comment: (NOTE) SARS-CoV-2 target nucleic acids are NOT DETECTED.  The SARS-CoV-2 RNA is generally detectable in upper respiratory specimens during the acute phase of infection. The lowest concentration of SARS-CoV-2 viral copies this assay can detect is 138 copies/mL. Devra Stare negative result does not preclude SARS-Cov-2 infection and should not be used as the sole basis for treatment or other patient management decisions. Amilya Haver negative result may occur with  improper specimen collection/handling, submission of specimen other than nasopharyngeal swab, presence of viral mutation(s)  within the areas targeted by this assay, and inadequate number of viral copies(<138 copies/mL). Khris Jansson negative result must be combined with clinical observations, patient history, and epidemiological information. The expected result is Negative.  Fact Sheet for Patients:  EntrepreneurPulse.com.au  Fact Sheet for Healthcare Providers:  IncredibleEmployment.be  This test is no t yet approved or cleared by the [place] FDA and  has been authorized for detection and/or diagnosis of SARS-CoV-2 by FDA under an Emergency Use Authorization (EUA). This EUA will remain  in effect (meaning this test can be used) for the duration of the COVID-19 declaration under Section 564(b)(1) of the Act, 21 U.S.C.section 360bbb-3(b)(1), unless the authorization is terminated  or revoked sooner.       Influenza Song Garris by PCR NEGATIVE NEGATIVE Final   Influenza B by PCR NEGATIVE NEGATIVE Final    Comment: (NOTE) The Xpert Xpress SARS-CoV-2/FLU/RSV plus assay is intended as an aid in the diagnosis of influenza from Nasopharyngeal swab specimens and should not be used as Clemence Lengyel sole basis for treatment. Nasal washings and aspirates are unacceptable for Xpert Xpress SARS-CoV-2/FLU/RSV testing.  Fact Sheet for  Patients: EntrepreneurPulse.com.au  Fact Sheet for Healthcare Providers: IncredibleEmployment.be  This test is not yet approved or cleared by the [place] FDA and has been authorized for detection and/or diagnosis of SARS-CoV-2 by FDA under an Emergency Use Authorization (EUA). This EUA will remain in effect (meaning this test can be used) for the duration of the COVID-19 declaration under Section 564(b)(1) of the Act, 21 U.S.C. section 360bbb-3(b)(1), unless the authorization is terminated or revoked.  Performed at [facility], [place] [address], [place], [place] [address]           Radiology Studies: IR REMOVAL TUN ACCESS W/ PORT W/O FL MOD SED  Result Date: 05/28/2021 INDICATION: 51 year old female with Kirby Cortese port catheter placed at an outside institution approximately 7 years previously. The port catheter is no longer working. She presents for port removal. No clinical concern for infection. EXAM: REMOVAL RIGHT IJ VEIN PORT-[place] Nine-CATH MEDICATIONS: None ANESTHESIA/SEDATION: None FLUOROSCOPY TIME:  Less than 1 mGy COMPLICATIONS: None immediate. PROCEDURE: Informed written consent was obtained from the patient after Sharaya Boruff thorough discussion of the procedural risks, benefits and alternatives. All questions were addressed. Maximal Sterile Barrier Technique was utilized including caps, mask, sterile gowns, sterile gloves, sterile drape, hand hygiene and skin antiseptic. Cambree Hendrix timeout was performed prior to the initiation of the procedure. The right chest was prepped and draped in Allyanna Appleman sterile fashion. Lidocaine was utilized for local anesthesia. An incision was made over the previously healed surgical incision. Utilizing blunt dissection, the port catheter and reservoir were removed from the underlying subcutaneous tissue in their entirety. The reservoir was transected from the catheter tubing. Lorretta Kerce glidewire was then advanced through the catheter tubing  and deep into the inferior vena cava. The port catheter tubing was then carefully removed over the wire and remained intact. The wire was removed. Securing sutures were also removed. The pocket was irrigated with Marcianne Ozbun copious amount of sterile normal saline. The pocket was closed with interrupted 3-0 Vicryl stitches. The subcutaneous tissue was closed with 3-0 Vicryl interrupted subcutaneous stitches. Azai Gaffin 4-0 Vicryl running subcuticular stitch was utilized to approximate the skin. Dermabond was applied. IMPRESSION: Successful right IJ vein Port-L[place] Nabi-Cath removal. Electronically Signed   By: Jacqulynn Cadet M.D.   On: 05/28/2021 17:26        Scheduled Meds:  amLODipine  2.5 mg Oral Daily   apixaban  5 mg Oral BID   atorvastatin  40 mg Oral Daily   carvedilol  25 mg Oral BID WC   [START ON 05/30/2021] furosemide  80 mg Intravenous BID   mometasone-formoterol  2 puff Inhalation Daily   pantoprazole  40 mg Oral Daily   PARoxetine  10 mg Oral Daily   potassium chloride  20 mEq Oral Daily   spironolactone  25 mg Oral Daily   vitamin B-12  1,000 mcg Oral Daily   Continuous Infusions:   LOS: 4 days    Time spent: over 30 min    Fayrene Helper, MD [facility]   To contact the attending provider between 7A-7P or the covering provider during after hours 7P-7A, please log into the web site [email or web address] and access using universal [place] password for that web site. If you do not have the password, please call the hospital operator.  05/29/2021, 8:00 PM

## 2021-05-30 ENCOUNTER — Other Ambulatory Visit: Payer: Self-pay | Admitting: Nurse Practitioner

## 2021-05-30 DIAGNOSIS — I5031 Acute diastolic (congestive) heart failure: Secondary | ICD-10-CM | POA: Diagnosis not present

## 2021-05-30 DIAGNOSIS — R600 Localized edema: Secondary | ICD-10-CM | POA: Diagnosis not present

## 2021-05-30 DIAGNOSIS — D500 Iron deficiency anemia secondary to blood loss (chronic): Secondary | ICD-10-CM | POA: Diagnosis not present

## 2021-05-30 NOTE — Plan of Care (Signed)
  Problem: Education: Goal: Knowledge of General Education information will improve Description: Including pain rating scale, medication(s)/side effects and non-pharmacologic comfort measures Outcome: Progressing   Problem: Clinical Measurements: Goal: Respiratory complications will improve Outcome: Progressing Goal: Cardiovascular complication will be avoided Outcome: Progressing   

## 2021-05-30 NOTE — Progress Notes (Signed)
PROGRESS NOTE    [name]  EOF:[id number] DOB: 1971-02-20 DOA: 05/25/2021 PCP: Ladell Pier, MD    Brief Narrative:  51 year old with history of lymphedema, and end-stage anemia requiring IV iron transfusions, heart failure with preserved ejection fraction, moderate persistent asthma, chronic hypoxia on 2 L oxygen, DVT on Eliquis, sleep apnea currently not using CPAP at home presented to the ER with shortness of breath, lower extremity pain and swelling and treated as presumed heart failure exacerbation.   Assessment & Plan:   Acute on chronic diastolic heart failure, anasarca: Presented with worsening lower extremity edema and shortness of breath.  Chest x-ray with pulmonary venous congestion, bilateral lower leg edema. Patient is treated with IV Lasix with some clinical response, urine output 2200 mL last 24 hours, -9 L since admission.  Still has significant fluid on her legs, will continue IV Lasix 80 mg twice daily today.  Intake output monitoring.  Daily weight.  Echocardiogram with ejection fraction 60 to 65%. Lower extremity edema with pain, D-dimer normal.  Ultrasound without evidence of DVT.  Acute hypoxemic respiratory failure: Multifactorial.  Diastolic CHF as above.  Patient has history of sleep apnea and untreated which may be contributing.  History of DVT on Eliquis: Continue Eliquis.  Iron deficiency anemia/Port-A-Cath malfunction: Removed.  Essential hypertension: On spironolactone, Coreg and amlodipine.  Valsartan on hold.  Morbid obesity: Will definitely benefit with lifestyle changes.  Discussed.   DVT prophylaxis:  apixaban (ELIQUIS) tablet 5 mg   Code Status: Full code Family Communication: Mother at the bedside Disposition Plan: Status is: Inpatient Remains inpatient appropriate because: IV diuresis.  Persistent symptoms.  Planned Discharge Destination: Home with Home Health           Consultants:  None  Procedures:   None  Antimicrobials:  None   Subjective: Patient seen and examined.  Breathing is better.  Legs are still edematous and swollen and tender.  Mother at the bedside.  Patient tells me that she has not used CPAP at home since her last machine was broken.  She tells me she does not use oxygen at home. Patient lives with her disabled mom and unable to go home and current situation.  She is asking me to explore possibilities for SNF placement.  Objective: Vitals:   05/30/21 0500 05/30/21 0500 05/30/21 0823 05/30/21 0935  BP:  117/77  119/88  Pulse:  76    Resp:  17    Temp:  97.6 F (36.4 C)    TempSrc:  Oral    SpO2:  98% 99%   Weight: (!) 152.5 kg     Height:        Intake/Output Summary (Last 24 hours) at 05/30/2021 1301 Last data filed at 05/30/2021 1236 Gross per 24 hour  Intake 1320 ml  Output 1050 ml  Net 270 ml   Filed Weights   05/28/21 0530 05/29/21 0452 05/30/21 0500  Weight: (!) 148.8 kg (!) 152.6 kg (!) 152.5 kg    Examination:  General: Chronically sick looking.  Older than his stated age.  Not in any distress.  On 2 L oxygen. Cardiovascular: S1-S2 normal.  Regular rate rhythm. Respiratory: Bilateral clear.  No added sounds. Gastrointestinal: Soft.  Nontender.  Obese and pendulous. Ext: Nonpitting edema, right more than left.  Tenderness on palpation all over.  1+ edema both thighs.  Chronic venous stasis changes and pigmentation.    Data Reviewed: I have personally reviewed following labs and imaging studies  CBC:  Recent Labs  Lab 05/25/21 2145 05/26/21 0445 05/27/21 0355 05/28/21 0851 05/29/21 0335  WBC 8.7 11.5* 6.0 4.7 5.8  NEUTROABS 8.1*  --  4.6 3.4 4.4  HGB 10.9* 10.1* 9.9* 11.6* 11.3*  HCT 37.8 34.7* 33.5* 39.0 37.2  MCV 93.1 94.3 93.3 90.5 89.0  PLT 140* 136* 133* 129* 026*   Basic Metabolic Panel: Recent Labs  Lab 05/25/21 2145 05/26/21 0445 05/27/21 0355 05/28/21 0851 05/29/21 0335 05/29/21 0520  NA 133* 135 134* 135 132*  --    K 3.9 4.1 3.7 4.2 4.0  --   CL 96* 97* 96* 92* 89*  --   CO2 29 30 31  35* 32  --   GLUCOSE 132* 140* 134* 137* 123*  --   BUN 12 13 23* 19 QUANTITY NOT SUFFICIENT, UNABLE TO PERFORM TEST 20  CREATININE 0.86 0.87 1.10* 0.87 0.94  --   CALCIUM 9.0 8.9 8.7* 9.2 9.4  --   MG  --   --  2.0  --  1.9  --   PHOS  --   --  4.7*  --  3.8  --    GFR: Estimated Creatinine Clearance: 100.7 mL/min (by C-G formula based on SCr of 0.94 mg/dL). Liver Function Tests: Recent Labs  Lab 05/25/21 2145 05/27/21 0355 05/28/21 0851 05/29/21 0335  AST 15 15 27 19   ALT 12 10 12 10   ALKPHOS 63 55 57 55  BILITOT 1.2 1.6* 1.3* 0.8  PROT 8.1 7.2 7.9 7.9  ALBUMIN 4.0 3.4* 3.5 3.5   No results for input(s): LIPASE, AMYLASE in the last 168 hours. No results for input(s): AMMONIA in the last 168 hours. Coagulation Profile: No results for input(s): INR, PROTIME in the last 168 hours. Cardiac Enzymes: No results for input(s): CKTOTAL, CKMB, CKMBINDEX, TROPONINI in the last 168 hours. BNP (last 3 results) No results for input(s): PROBNP in the last 8760 hours. HbA1C: No results for input(s): HGBA1C in the last 72 hours. CBG: Recent Labs  Lab 05/25/21 1705 05/25/21 1825 05/25/21 2139  GLUCAP 77 98 132*   Lipid Profile: No results for input(s): CHOL, HDL, LDLCALC, TRIG, CHOLHDL, LDLDIRECT in the last 72 hours. Thyroid Function Tests: No results for input(s): TSH, T4TOTAL, FREET4, T3FREE, THYROIDAB in the last 72 hours. Anemia Panel: No results for input(s): VITAMINB12, FOLATE, FERRITIN, TIBC, IRON, RETICCTPCT in the last 72 hours. Sepsis Labs: No results for input(s): PROCALCITON, LATICACIDVEN in the last 168 hours.  Recent Results (from the past 240 hour(s))  Resp Panel by RT-PCR (Flu A&B, Covid) Nasopharyngeal Swab     Status: None   Collection Time: 05/25/21  9:55 PM   Specimen: Nasopharyngeal Swab; Nasopharyngeal(NP) swabs in vial transport medium  Result Value Ref Range Status   SARS  Coronavirus 2 by RT PCR NEGATIVE NEGATIVE Final    Comment: (NOTE) SARS-CoV-2 target nucleic acids are NOT DETECTED.  The SARS-CoV-2 RNA is generally detectable in upper respiratory specimens during the acute phase of infection. The lowest concentration of SARS-CoV-2 viral copies this assay can detect is 138 copies/mL. A negative result does not preclude SARS-Cov-2 infection and should not be used as the sole basis for treatment or other patient management decisions. A negative result may occur with  improper specimen collection/handling, submission of specimen other than nasopharyngeal swab, presence of viral mutation(s) within the areas targeted by this assay, and inadequate number of viral copies(<138 copies/mL). A negative result must be combined with clinical observations, patient history, and epidemiological information. The  expected result is Negative.  Fact Sheet for Patients:  EntrepreneurPulse.com.au  Fact Sheet for Healthcare Providers:  IncredibleEmployment.be  This test is no t yet approved or cleared by the [place] FDA and  has been authorized for detection and/or diagnosis of SARS-CoV-2 by FDA under an Emergency Use Authorization (EUA). This EUA will remain  in effect (meaning this test can be used) for the duration of the COVID-19 declaration under Section 564(b)(1) of the Act, 21 U.S.C.section 360bbb-3(b)(1), unless the authorization is terminated  or revoked sooner.       Influenza A by PCR NEGATIVE NEGATIVE Final   Influenza B by PCR NEGATIVE NEGATIVE Final    Comment: (NOTE) The Xpert Xpress SARS-CoV-2/FLU/RSV plus assay is intended as an aid in the diagnosis of influenza from Nasopharyngeal swab specimens and should not be used as a sole basis for treatment. Nasal washings and aspirates are unacceptable for Xpert Xpress SARS-CoV-2/FLU/RSV testing.  Fact Sheet for  Patients: EntrepreneurPulse.com.au  Fact Sheet for Healthcare Providers: IncredibleEmployment.be  This test is not yet approved or cleared by the [place] FDA and has been authorized for detection and/or diagnosis of SARS-CoV-2 by FDA under an Emergency Use Authorization (EUA). This EUA will remain in effect (meaning this test can be used) for the duration of the COVID-19 declaration under Section 564(b)(1) of the Act, 21 U.S.C. section 360bbb-3(b)(1), unless the authorization is terminated or revoked.  Performed at [facility], [place] [address] [place], [place] [address]          Radiology Studies: IR REMOVAL TUN ACCESS W/ PORT W/O FL MOD SED  Result Date: 05/28/2021 INDICATION: 51 year old female with a port catheter placed at an outside institution approximately 7 years previously. The port catheter is no longer working. She presents for port removal. No clinical concern for infection. EXAM: REMOVAL RIGHT IJ VEIN PORT-A-CATH MEDICATIONS: None ANESTHESIA/SEDATION: None FLUOROSCOPY TIME:  Less than 1 mGy COMPLICATIONS: None immediate. PROCEDURE: Informed written consent was obtained from the patient after a thorough discussion of the procedural risks, benefits and alternatives. All questions were addressed. Maximal Sterile Barrier Technique was utilized including caps, mask, sterile gowns, sterile gloves, sterile drape, hand hygiene and skin antiseptic. A timeout was performed prior to the initiation of the procedure. The right chest was prepped and draped in a sterile fashion. Lidocaine was utilized for local anesthesia. An incision was made over the previously healed surgical incision. Utilizing blunt dissection, the port catheter and reservoir were removed from the underlying subcutaneous tissue in their entirety. The reservoir was transected from the catheter tubing. A glidewire was then advanced through the catheter tubing and  deep into the inferior vena cava. The port catheter tubing was then carefully removed over the wire and remained intact. The wire was removed. Securing sutures were also removed. The pocket was irrigated with a copious amount of sterile normal saline. The pocket was closed with interrupted 3-0 Vicryl stitches. The subcutaneous tissue was closed with 3-0 Vicryl interrupted subcutaneous stitches. A 4-0 Vicryl running subcuticular stitch was utilized to approximate the skin. Dermabond was applied. IMPRESSION: Successful right IJ vein Port-A-Cath removal. Electronically Signed   By: Jacqulynn Cadet M.D.   On: 05/28/2021 17:26        Scheduled Meds:  amLODipine  2.5 mg Oral Daily   apixaban  5 mg Oral BID   atorvastatin  40 mg Oral Daily   carvedilol  25 mg Oral BID WC   furosemide  80 mg Intravenous BID  mometasone-formoterol  2 puff Inhalation Daily   pantoprazole  40 mg Oral Daily   PARoxetine  10 mg Oral Daily   potassium chloride  20 mEq Oral Daily   spironolactone  25 mg Oral Daily   vitamin B-12  1,000 mcg Oral Daily   Continuous Infusions:   LOS: 5 days    Time spent: 35 minutes       Barb Merino, MD [facility] Pager [phone]

## 2021-05-30 NOTE — Progress Notes (Signed)
Physical Therapy Treatment Patient Details Name: [name] MRN: [id number] DOB: [birth year] Today's Date: 05/30/2021   History of Present Illness 51 yo female admitted with CHF exac. Hx of obesity, HF, DVT, asthma, anemia, lymphedema, CAD, OSA    PT Comments    Pt AxO x 3 very pleasant.  Pt appears at prior level of mobility as she was able to get OOB to bathroom all at Supervision level.  General Gait Details: pt able to self amb from bed to bathroom approx 15 feet with her SPC at Supervision level.  Then amb another 15 feet back from bathroom back to bed.  Mild c/o B LE pain "acky"  Tolerated a functional distance.  Tolerated well. Assisted back to bed per pt request.   Recommendations for follow up therapy are one component of a multi-disciplinary discharge planning process, led by the attending physician.  Recommendations may be updated based on patient status, additional functional criteria and insurance authorization.  Follow Up Recommendations  Home health PT     Assistance Recommended at Discharge PRN  Patient can return home with the following A little help with walking and/or transfers;Help with stairs or ramp for entrance;Assist for transportation   Equipment Recommendations  None recommended by PT    Recommendations for Other Services       Precautions / Restrictions Precautions Precautions: Fall     Mobility  Bed Mobility Overal bed mobility: Needs Assistance Bed Mobility: Supine to Sit, Sit to Supine     Supine to sit: Supervision Sit to supine: Supervision   General bed mobility comments: pt self able with increased time    Transfers Overall transfer level: Needs assistance Equipment used: Straight cane Transfers: Sit to/from Stand, Bed to chair/wheelchair/BSC Sit to Stand: Supervision Stand pivot transfers: Supervision         General transfer comment: pt self able from bed and regular height toilet with grab bar.     Ambulation/Gait Ambulation/Gait assistance: Supervision Gait Distance (Feet): 30 Feet (15 feet x 2 to and from bathroom) Assistive device: Straight cane Gait Pattern/deviations: Step-through pattern, Decreased stride length Gait velocity: decreased     General Gait Details: pt able to self amb from bed to bathroom approx 15 feet with her SPC at Supervision level.  Then amb another 15 feet back from bathroom back to bed.  Mild c/o B LE pain "acky"  Tolerated a functional distance.  Appears at prior level.  Tolerated well.   Stairs             Wheelchair Mobility    Modified Rankin (Stroke Patients Only)       Balance                                            Cognition Arousal/Alertness: Awake/alert Behavior During Therapy: WFL for tasks assessed/performed Overall Cognitive Status: Within Functional Limits for tasks assessed                                 General Comments: AxO x 3 very pleasant        Exercises      General Comments        Pertinent Vitals/Pain Pain Assessment Pain Assessment: Faces Faces Pain Scale: Hurts a little bit Pain Location: bil LEs Pain Descriptors / Indicators: Discomfort,  Sore Pain Intervention(s): Monitored during session, Repositioned    Home Living                          Prior Function            PT Goals (current goals can now be found in the care plan section) Progress towards PT goals: Progressing toward goals    Frequency    Min 3X/week      PT Plan Current plan remains appropriate    Co-evaluation              AM-PAC PT "6 Clicks" Mobility   Outcome Measure  Help needed turning from your back to your side while in a flat bed without using bedrails?: None Help needed moving from lying on your back to sitting on the side of a flat bed without using bedrails?: None Help needed moving to and from a bed to a chair (including a wheelchair)?: None Help  needed standing up from a chair using your arms (e.g., wheelchair or bedside chair)?: None Help needed to walk in hospital room?: A Little Help needed climbing 3-5 steps with a railing? : A Little 6 Click Score: 22    End of Session Equipment Utilized During Treatment: Gait belt Activity Tolerance: Patient tolerated treatment well Patient left: in bed;with call bell/phone within reach Nurse Communication: Mobility status PT Visit Diagnosis: Unsteadiness on feet (R26.81);Muscle weakness (generalized) (M62.81)     Time: 8756-4332 PT Time Calculation (min) (ACUTE ONLY): 25 min  Charges:  $Gait Training: 8-22 mins $Therapeutic Activity: 8-22 mins                    Rica Koyanagi  PTA Acute  Rehabilitation Services Pager      [phone] Office      [phone]

## 2021-05-31 DIAGNOSIS — D500 Iron deficiency anemia secondary to blood loss (chronic): Secondary | ICD-10-CM | POA: Diagnosis not present

## 2021-05-31 DIAGNOSIS — I5031 Acute diastolic (congestive) heart failure: Secondary | ICD-10-CM | POA: Diagnosis not present

## 2021-05-31 DIAGNOSIS — R600 Localized edema: Secondary | ICD-10-CM | POA: Diagnosis not present

## 2021-05-31 NOTE — Progress Notes (Signed)
PROGRESS NOTE    [name]  QIW:[id number] DOB: 08-May-1970 DOA: 05/25/2021 PCP: Ladell Pier, MD    Brief Narrative:  51 year old with history of lymphedema, and end-stage anemia requiring IV iron transfusions, heart failure with preserved ejection fraction, moderate persistent asthma, DVT on Eliquis, sleep apnea currently not using CPAP at home presented to the ER with shortness of breath, lower extremity pain and swelling and treated as presumed heart failure exacerbation.   Assessment & Plan:   Acute on chronic diastolic heart failure, anasarca: Presented with worsening lower extremity edema and shortness of breath.  Chest x-ray with pulmonary venous congestion, bilateral lower leg edema. Patient is treated with IV Lasix with some clinical response, urine output 1650 mL last 24 hours, -10 L since admission.  Still has significant fluid on her legs, will continue IV Lasix 80 mg twice daily today.  Intake output monitoring.  Daily weight.  Echocardiogram with ejection fraction 60 to 65%. Lower extremity edema with pain, D-dimer normal.  Ultrasound without evidence of DVT.  Acute hypoxemic respiratory failure: Multifactorial.  Diastolic CHF as above.  Patient has history of sleep apnea and untreated which may be contributing. Currently on room air.  History of DVT on Eliquis: Continue Eliquis.  Iron deficiency anemia/Port-A-Cath malfunction: Removed.  Essential hypertension: On spironolactone, Coreg and amlodipine.  Valsartan on hold.  Morbid obesity: Will definitely benefit with lifestyle changes.  Discussed.   DVT prophylaxis:  apixaban (ELIQUIS) tablet 5 mg   Code Status: Full code Family Communication: None today. Disposition Plan: Status is: Inpatient Remains inpatient appropriate because: IV diuresis.  Persistent symptoms.  Planned Discharge Destination: Home with Home Health.  Anticipate tomorrow.           Consultants:  None  Procedures:   None  Antimicrobials:  None   Subjective:  Patient seen and examined.  Breathing better.  Still has tightness and swelling of the legs.  She takes care of her disabled mom.  Anxious about challenges at home.  Objective: Vitals:   05/31/21 0527 05/31/21 0818 05/31/21 0905 05/31/21 0907  BP: 126/69  (!) 117/104   Pulse: 81   87  Resp: 16     Temp: 98.5 F (36.9 C)     TempSrc: Oral     SpO2: 100% 99%    Weight:      Height:        Intake/Output Summary (Last 24 hours) at 05/31/2021 1316 Last data filed at 05/31/2021 0900 Gross per 24 hour  Intake 720 ml  Output 1700 ml  Net -980 ml   Filed Weights   05/29/21 0452 05/30/21 0500 05/31/21 0500  Weight: (!) 152.6 kg (!) 152.5 kg (!) 151.5 kg    Examination:  General: Chronically sick looking.  Not in any distress.  On room air at rest. Cardiovascular: S1-S2 normal.  Regular rate rhythm. Respiratory: Bilateral clear.  No added sounds. Gastrointestinal: Soft.  Nontender.  Obese and pendulous. Ext: Nonpitting edema, right more than left.  Tenderness on palpation all over.  1+ edema both thighs.  Chronic venous stasis changes and pigmentation.    Data Reviewed: I have personally reviewed following labs and imaging studies  CBC: Recent Labs  Lab 05/25/21 2145 05/26/21 0445 05/27/21 0355 05/28/21 0851 05/29/21 0335  WBC 8.7 11.5* 6.0 4.7 5.8  NEUTROABS 8.1*  --  4.6 3.4 4.4  HGB 10.9* 10.1* 9.9* 11.6* 11.3*  HCT 37.8 34.7* 33.5* 39.0 37.2  MCV 93.1 94.3 93.3 90.5 89.0  PLT 140* 136* 133* 129* 417*   Basic Metabolic Panel: Recent Labs  Lab 05/25/21 2145 05/26/21 0445 05/27/21 0355 05/28/21 0851 05/29/21 0335 05/29/21 0520  NA 133* 135 134* 135 132*  --   K 3.9 4.1 3.7 4.2 4.0  --   CL 96* 97* 96* 92* 89*  --   CO2 29 30 31  35* 32  --   GLUCOSE 132* 140* 134* 137* 123*  --   BUN 12 13 23* 19 QUANTITY NOT SUFFICIENT, UNABLE TO PERFORM TEST 20  CREATININE 0.86 0.87 1.10* 0.87 0.94  --   CALCIUM 9.0 8.9  8.7* 9.2 9.4  --   MG  --   --  2.0  --  1.9  --   PHOS  --   --  4.7*  --  3.8  --    GFR: Estimated Creatinine Clearance: 100.2 mL/min (by C-G formula based on SCr of 0.94 mg/dL). Liver Function Tests: Recent Labs  Lab 05/25/21 2145 05/27/21 0355 05/28/21 0851 05/29/21 0335  AST 15 15 27 19   ALT 12 10 12 10   ALKPHOS 63 55 57 55  BILITOT 1.2 1.6* 1.3* 0.8  PROT 8.1 7.2 7.9 7.9  ALBUMIN 4.0 3.4* 3.5 3.5   No results for input(s): LIPASE, AMYLASE in the last 168 hours. No results for input(s): AMMONIA in the last 168 hours. Coagulation Profile: No results for input(s): INR, PROTIME in the last 168 hours. Cardiac Enzymes: No results for input(s): CKTOTAL, CKMB, CKMBINDEX, TROPONINI in the last 168 hours. BNP (last 3 results) No results for input(s): PROBNP in the last 8760 hours. HbA1C: No results for input(s): HGBA1C in the last 72 hours. CBG: Recent Labs  Lab 05/25/21 1705 05/25/21 1825 05/25/21 2139  GLUCAP 77 98 132*   Lipid Profile: No results for input(s): CHOL, HDL, LDLCALC, TRIG, CHOLHDL, LDLDIRECT in the last 72 hours. Thyroid Function Tests: No results for input(s): TSH, T4TOTAL, FREET4, T3FREE, THYROIDAB in the last 72 hours. Anemia Panel: No results for input(s): VITAMINB12, FOLATE, FERRITIN, TIBC, IRON, RETICCTPCT in the last 72 hours. Sepsis Labs: No results for input(s): PROCALCITON, LATICACIDVEN in the last 168 hours.  Recent Results (from the past 240 hour(s))  Resp Panel by RT-PCR (Flu A&B, Covid) Nasopharyngeal Swab     Status: None   Collection Time: 05/25/21  9:55 PM   Specimen: Nasopharyngeal Swab; Nasopharyngeal(NP) swabs in vial transport medium  Result Value Ref Range Status   SARS Coronavirus 2 by RT PCR NEGATIVE NEGATIVE Final    Comment: (NOTE) SARS-CoV-2 target nucleic acids are NOT DETECTED.  The SARS-CoV-2 RNA is generally detectable in upper respiratory specimens during the acute phase of infection. The lowest concentration of  SARS-CoV-2 viral copies this assay can detect is 138 copies/mL. A negative result does not preclude SARS-Cov-2 infection and should not be used as the sole basis for treatment or other patient management decisions. A negative result may occur with  improper specimen collection/handling, submission of specimen other than nasopharyngeal swab, presence of viral mutation(s) within the areas targeted by this assay, and inadequate number of viral copies(<138 copies/mL). A negative result must be combined with clinical observations, patient history, and epidemiological information. The expected result is Negative.  Fact Sheet for Patients:  EntrepreneurPulse.com.au  Fact Sheet for Healthcare Providers:  IncredibleEmployment.be  This test is no t yet approved or cleared by the [place] FDA and  has been authorized for detection and/or diagnosis of SARS-CoV-2 by FDA under an Emergency Use Authorization (  EUA). This EUA will remain  in effect (meaning this test can be used) for the duration of the COVID-19 declaration under Section 564(b)(1) of the Act, 21 U.S.C.section 360bbb-3(b)(1), unless the authorization is terminated  or revoked sooner.       Influenza A by PCR NEGATIVE NEGATIVE Final   Influenza B by PCR NEGATIVE NEGATIVE Final    Comment: (NOTE) The Xpert Xpress SARS-CoV-2/FLU/RSV plus assay is intended as an aid in the diagnosis of influenza from Nasopharyngeal swab specimens and should not be used as a sole basis for treatment. Nasal washings and aspirates are unacceptable for Xpert Xpress SARS-CoV-2/FLU/RSV testing.  Fact Sheet for Patients: EntrepreneurPulse.com.au  Fact Sheet for Healthcare Providers: IncredibleEmployment.be  This test is not yet approved or cleared by the [place] FDA and has been authorized for detection and/or diagnosis of SARS-CoV-2 by FDA under an Emergency Use  Authorization (EUA). This EUA will remain in effect (meaning this test can be used) for the duration of the COVID-19 declaration under Section 564(b)(1) of the Act, 21 U.S.C. section 360bbb-3(b)(1), unless the authorization is terminated or revoked.  Performed at [facility], [place] [address] [place], [place] [address]          Radiology Studies: No results found.      Scheduled Meds:  amLODipine  2.5 mg Oral Daily   apixaban  5 mg Oral BID   atorvastatin  40 mg Oral Daily   carvedilol  25 mg Oral BID WC   furosemide  80 mg Intravenous BID   mometasone-formoterol  2 puff Inhalation Daily   pantoprazole  40 mg Oral Daily   PARoxetine  10 mg Oral Daily   potassium chloride  20 mEq Oral Daily   spironolactone  25 mg Oral Daily   vitamin B-12  1,000 mcg Oral Daily   Continuous Infusions:   LOS: 6 days    Time spent: 35 minutes    Barb Merino, MD [facility] Pager [phone]

## 2021-05-31 NOTE — TOC Progression Note (Signed)
Transition of Care [facility]) - Progression Note    Patient Details  Name: [name] MRN: [id number] Date of Birth: May 15, 1970  [facility] CM/SW Contact  Purcell Mouton, RN Phone Number: 05/31/2021, 1:19 PM  Clinical Narrative:     Explained to pt several times that PT recommend HHPT. Pt asked if PT could come back. Request was made. Also explained to pt that PT could not change her to SNF when she do not need it. When offering HHPT to pt. Pt states that her dogs will not allow any one in the home. Pt agreed to going to OP PT. Pt will discharge home in the AM.    Expected Discharge Plan: Home/Self Care Barriers to Discharge: No Barriers Identified  Expected Discharge Plan and Services Expected Discharge Plan: Home/Self Care       Living arrangements for the past 2 months: Single Family Home                                       Social Determinants of Health (SDOH) Interventions    Readmission Risk Interventions No flowsheet data found.

## 2021-06-01 DIAGNOSIS — R600 Localized edema: Secondary | ICD-10-CM

## 2021-06-01 DIAGNOSIS — D500 Iron deficiency anemia secondary to blood loss (chronic): Secondary | ICD-10-CM | POA: Diagnosis not present

## 2021-06-01 DIAGNOSIS — I5031 Acute diastolic (congestive) heart failure: Secondary | ICD-10-CM | POA: Diagnosis not present

## 2021-06-01 LAB — BASIC METABOLIC PANEL
Anion gap: 7 (ref 5–15)
BUN: 27 mg/dL — ABNORMAL HIGH (ref 6–20)
CO2: 33 mmol/L — ABNORMAL HIGH (ref 22–32)
Calcium: 9.2 mg/dL (ref 8.9–10.3)
Chloride: 91 mmol/L — ABNORMAL LOW (ref 98–111)
Creatinine, Ser: 0.97 mg/dL (ref 0.44–1.00)
GFR, Estimated: >60 mL/min (ref >60)
Glucose, Bld: 168 mg/dL — ABNORMAL HIGH (ref 70–99)
Potassium: 3.6 mmol/L (ref 3.5–5.1)
Sodium: 131 mmol/L — ABNORMAL LOW (ref 135–145)

## 2021-06-01 MED ORDER — FUROSEMIDE 80 MG PO TABS: 80.0000 mg | ORAL_TABLET | Freq: Every day | ORAL | 4 refills | Status: DC

## 2021-06-01 NOTE — Progress Notes (Signed)
Physical Therapy Treatment Patient Details Name: [name] MRN: [id number] DOB: 05/06/1970 Today's Date: 06/01/2021   History of Present Illness 51 yo female admitted with CHF exac. Hx of obesity, HF, DVT, asthma, anemia, lymphedema, CAD, OSA    PT Comments    Pt is AxO x 3 and shares that she lives with her 70 year old mother in an apartment that is on the first floor.   General Gait Details: pt self able to amb a functional distance using her cane and holding to furniture.  posture is poor as she tend to walk "hunched over" but stated she has been like this for years due to her back. Pt amb in hallway 70 feet at Supervision level.  She is slow but steady.  Pt appears at prior level of mobility.General transfer comment: pt self able and even navigating to and from bathroom as well as around her room without assistance.General bed mobility comments: pt self able with increased time. Rec D/C to home with [facility] PT.  No equipment needed.   Recommendations for follow up therapy are one component of a multi-disciplinary discharge planning process, led by the attending physician.  Recommendations may be updated based on patient status, additional functional criteria and insurance authorization.  Follow Up Recommendations  Home health PT     Assistance Recommended at Discharge    Patient can return home with the following A little help with walking and/or transfers;Help with stairs or ramp for entrance;Assist for transportation   Equipment Recommendations       Recommendations for Other Services       Precautions / Restrictions Precautions Precautions: Fall Restrictions Weight Bearing Restrictions: No     Mobility  Bed Mobility Overal bed mobility: Modified Independent             General bed mobility comments: pt self able with increased time    Transfers Overall transfer level: Modified independent Equipment used: Straight cane Transfers: Sit to/from Stand, Bed  to chair/wheelchair/BSC Sit to Stand: Modified independent (Device/Increase time)           General transfer comment: pt self able and even navigating to and from bathroom as well as around her room without assistance.    Ambulation/Gait Ambulation/Gait assistance: Supervision Gait Distance (Feet): 70 Feet Assistive device: Straight cane Gait Pattern/deviations: Step-through pattern, Decreased stride length Gait velocity: decreased     General Gait Details: pt self able to amb a functional distance using her cane and holding to furniture.  posture is poor as she tend to walk "hunched over" but stated she has been like this for years due to her back. Pt appears at prioe level of mobility.   Stairs             Wheelchair Mobility    Modified Rankin (Stroke Patients Only)       Balance                                            Cognition Arousal/Alertness: Awake/alert Behavior During Therapy: WFL for tasks assessed/performed Overall Cognitive Status: Within Functional Limits for tasks assessed                                 General Comments: AxO x 3 very pleasant  Exercises      General Comments        Pertinent Vitals/Pain Pain Assessment Pain Assessment: No/denies pain    Home Living                          Prior Function            PT Goals (current goals can now be found in the care plan section) Progress towards PT goals: Progressing toward goals    Frequency    Min 3X/week      PT Plan Current plan remains appropriate    Co-evaluation              AM-PAC PT "6 Clicks" Mobility   Outcome Measure  Help needed turning from your back to your side while in a flat bed without using bedrails?: None Help needed moving from lying on your back to sitting on the side of a flat bed without using bedrails?: None Help needed moving to and from a bed to a chair (including a  wheelchair)?: None Help needed standing up from a chair using your arms (e.g., wheelchair or bedside chair)?: None Help needed to walk in hospital room?: None Help needed climbing 3-5 steps with a railing? : A Little 6 Click Score: 23    End of Session Equipment Utilized During Treatment: Gait belt Activity Tolerance: Patient tolerated treatment well Patient left: in bed;with call bell/phone within reach Nurse Communication: Mobility status PT Visit Diagnosis: Unsteadiness on feet (R26.81);Muscle weakness (generalized) (M62.81)     Time: 2458-0998 PT Time Calculation (min) (ACUTE ONLY): 24 min  Charges:  $Gait Training: 8-22 mins $Therapeutic Activity: 8-22 mins                     Rica Koyanagi  PTA Acute  Rehabilitation Services Pager      [phone] Office      [phone]

## 2021-06-01 NOTE — Discharge Summary (Signed)
Physician Discharge Summary  [name] RJJ:[id number] DOB: 02/21/1971 DOA: 05/25/2021  PCP: Ladell Pier, MD  Admit date: 05/25/2021 Discharge date: 06/01/2021  Admitted From: Home Disposition: Home with outpatient physical therapy  Recommendations for Outpatient Follow-up:  Follow up with PCP in 1-2 weeks Please obtain BMP/CBC in one week   Home Health: N/A, outpatient therapist Equipment/Devices: Available at home  Discharge Condition: Stable CODE STATUS: Full code Diet recommendation: Low-salt, low-carb diet.  Fluid intake less than 1500 mL/day.  Discharge Summary: 51 year old with history of lymphedema, and end-stage anemia requiring IV iron transfusions, heart failure with preserved ejection fraction, moderate persistent asthma, DVT on Eliquis, sleep apnea currently not using CPAP at home presented to the ER with shortness of breath, lower extremity pain and swelling and treated as presumed heart failure exacerbation.   # Acute on chronic diastolic heart failure, anasarca, chronic lymphedema: Presented with worsening lower extremity edema and shortness of breath.  Chest x-ray with pulmonary venous congestion, bilateral lower leg edema. Patient treated with IV Lasix with good clinical response.  She had more than 10 L negative balance since admission.  Echocardiogram with normal ejection fraction.  D-dimer normal.  Leg ultrasound without evidence of DVT. With clinical response, she will be going home with increased dose of Lasix 80 mg twice a day.  She is already on beta-blockers, losartan, Aldactone that she will continue. We discussed about importance of treating her sleep apnea, importance of salt restriction and fluid restriction.   Acute hypoxemic respiratory failure: Multifactorial.  Diastolic CHF as above.  Patient has history of sleep apnea and untreated which may be contributing. Improved to room air.  She will try to go back on CPAP.   History of DVT on  Eliquis: Continue Eliquis.  Therapeutic.  Continued.   Iron deficiency anemia/Port-A-Cath malfunction: Removed.  Hematology to follow-up.   Essential hypertension: On spironolactone, Coreg and amlodipine.  Resume valsartan.   Morbid obesity: Will definitely benefit with lifestyle changes.  Discussed.  Patient is deconditioned, she will benefit with therapies.  She declined physical therapist to come home.  Will refer to outpatient physical therapy.  Stable for discharge.   Discharge Diagnoses:  Principal Problem:   Acute CHF (congestive heart failure) (HCC) Active Problems:   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 (HCC)   S/P insertion of IVC (inferior vena caval) filter   Essential hypertension   Iron deficiency anemia due to chronic blood loss   Bilateral lower extremity edema   History of DVT (deep vein thrombosis)   Acute on chronic respiratory failure with hypoxia (HCC)   Morbid obesity with BMI of 50.0-59.9, adult ([place])   Encounter for care related to Port-a-Cath   Depression    Discharge Instructions  Discharge Instructions     (HEART FAILURE PATIENTS) Call MD:  Anytime you have any of the following symptoms: 1) 3 pound weight gain in 24 hours or 5 pounds in 1 week 2) shortness of breath, with or without a dry hacking cough 3) swelling in the hands, feet or stomach 4) if you have to sleep on extra pillows at night in order to breathe.   Complete by: As directed    Call MD for:  difficulty breathing, headache or visual disturbances   Complete by: As directed    Diet - low sodium heart healthy   Complete by: As directed    Less than 1500 ml fluid a day   Increase activity  slowly   Complete by: As directed °  ° °  ° °Allergies as of 06/01/2021   ° °   Reactions  ° Ace Inhibitors Rash, Other (See Comments)  ° Make pt bleed  ° Aspirin Other (See Comments)  ° Per patient paperwork: blood clot?  Likely because of chronic DOAC  °  Hydromorphone Hives, Itching  ° Vancomycin Itching, Rash  ° Contrast Media [iodinated Contrast Media] Hives  ° Dilaudid [hydromorphone Hcl] Hives  ° °  ° °  °Medication List  °  ° °TAKE these medications   ° °Accu-Chek Guide Control Liqd °1 each by In Vitro route daily. °  °Accu-Chek Guide w/Device Kit °1 each by Does not apply route 2 (two) times daily. °  °Accu-Chek Softclix Lancets lancets °Use as instructed °What changed:  °how much to take °how to take this °when to take this °  °acetaminophen 500 MG tablet °Commonly known as: TYLENOL °Take 1 tablet (500 mg total) by mouth every 6 (six) hours as needed. °What changed: reasons to take this °  °albuterol 108 (90 Base) MCG/ACT inhaler °Commonly known as: ProAir HFA °INHALE TWO PUFFS BY MOUTH EVERY 6 HOURS AS NEEDED FOR WHEEZING OR SHORTNESS OF BREATH °What changed:  °how much to take °how to take this °when to take this °  °amLODipine 5 MG tablet °Commonly known as: NORVASC °TAKE 1/2 TABLET BY MOUTH DAILY °  °atorvastatin 40 MG tablet °Commonly known as: LIPITOR °Take 1 tablet (40 mg total) by mouth daily. °  °carvedilol 25 MG tablet °Commonly known as: COREG °TAKE ONE TABLET BY MOUTH TWICE A DAY WITH MEALS °What changed:  °how much to take °how to take this °when to take this °additional instructions °  °cholecalciferol 25 MCG (1000 UNIT) tablet °Commonly known as: VITAMIN D3 °Take 1 tablet (1,000 Units total) by mouth daily. °  °dapagliflozin propanediol 10 MG Tabs tablet °Commonly known as: FARXIGA °Take 1 tablet (10 mg total) by mouth daily. °  °Dulera 200-5 MCG/ACT Aero °Generic drug: mometasone-formoterol °INHALE 2 PUFFS INTO THE LUNGS TWO TIMES A DAY °What changed: See the new instructions. °  °Eliquis 5 MG Tabs tablet °Generic drug: apixaban °TAKE ONE TABLET BY MOUTH TWICE A DAY °What changed: how much to take °  °EYE DROPS OP °Place 1 drop into both eyes daily as needed (dry eyes). °  °ferrous sulfate 325 (65 FE) MG tablet °Take 1 tablet (325 mg total) by  mouth daily with breakfast. °  °furosemide 80 MG tablet °Commonly known as: LASIX °Take 1 tablet (80 mg total) by mouth daily. Take additional dose of lasix for weight gain, leg swelling °What changed: additional instructions °  °gabapentin 100 MG capsule °Commonly known as: NEURONTIN °TAKE 1 CAPSULE BY MOUTH THREE TIMES A DAY AS NEEDED FOR PAN °What changed: See the new instructions. °  °glucose blood test strip °Use as instructed °What changed:  °how much to take °how to take this °when to take this °  °hydrocortisone cream 1 % °Apply 1 application topically daily as needed for itching. °  °hydrOXYzine 25 MG capsule °Commonly known as: VISTARIL °Take 1 capsule (25 mg total) by mouth at bedtime as needed. °What changed: reasons to take this °  °Klor-Con M20 20 MEQ tablet °Generic drug: potassium chloride SA °TAKE ONE TABLET BY MOUTH DAILY °What changed: how much to take °  °lidocaine 5 % °Commonly known as: Lidoderm °Place 1 patch onto the skin daily. Leave on for 12   hrs each day then remove.   methocarbamol 500 MG tablet Commonly known as: ROBAXIN TAKE ONE TABLET BY MOUTH DAILY AS NEEDED FOR MUSCLE SPASMS What changed:  how much to take how to take this when to take this   nitroGLYCERIN 0.4 MG SL tablet Commonly known as: NITROSTAT Place 1 tablet (0.4 mg total) under the tongue every 5 (five) minutes as needed for chest pain.   omeprazole 20 MG capsule Commonly known as: PRILOSEC Take 1 capsule (20 mg total) by mouth daily as needed (acid reflux).   PARoxetine 10 MG tablet Commonly known as: PAXIL TAKE ONE TABLET BY MOUTH DAILY   spironolactone 25 MG tablet Commonly known as: ALDACTONE Take 1 tablet (25 mg total) by mouth daily.   valsartan 160 MG tablet Commonly known as: DIOVAN Take 1 tablet (160 mg total) by mouth daily.   vitamin B-12 1000 MCG tablet Commonly known as: CYANOCOBALAMIN Take 1,000 mcg by mouth daily.        Allergies  Allergen Reactions   Ace Inhibitors  Rash and Other (See Comments)    Make pt bleed   Aspirin Other (See Comments)    Per patient paperwork: blood clot?  Likely because of chronic DOAC   Hydromorphone Hives and Itching   Vancomycin Itching and Rash   Contrast Media [Iodinated Contrast Media] Hives   Dilaudid [Hydromorphone Hcl] Hives    Consultations: None   Procedures/Studies: DG Chest 2 View  Result Date: 05/25/2021 CLINICAL DATA:  Short of breath EXAM: CHEST - 2 VIEW COMPARISON:  Chest x-ray 11/22/2020 FINDINGS: Similar-appearing Port-A-Cath with tip overlying the expected region of the superior cavoatrial junction. The heart and mediastinal contours are unchanged. Prominent azygous vein again noted along the right mediastinal silhouette. Increased hilar vascular prominence. No focal consolidation. No pulmonary edema. No pleural effusion. No pneumothorax. No acute osseous abnormality. IMPRESSION: 1. Pulmonary venous congestion. 2.  Aortic Atherosclerosis (ICD10-I70.0). Electronically Signed   By: Iven Finn M.D.   On: 05/25/2021 17:30   IR REMOVAL TUN ACCESS W/ PORT W/O FL MOD SED  Result Date: 05/28/2021 INDICATION: 51 year old female with a port catheter placed at an outside institution approximately 7 years previously. The port catheter is no longer working. She presents for port removal. No clinical concern for infection. EXAM: REMOVAL RIGHT IJ VEIN PORT-A-CATH MEDICATIONS: None ANESTHESIA/SEDATION: None FLUOROSCOPY TIME:  Less than 1 mGy COMPLICATIONS: None immediate. PROCEDURE: Informed written consent was obtained from the patient after a thorough discussion of the procedural risks, benefits and alternatives. All questions were addressed. Maximal Sterile Barrier Technique was utilized including caps, mask, sterile gowns, sterile gloves, sterile drape, hand hygiene and skin antiseptic. A timeout was performed prior to the initiation of the procedure. The right chest was prepped and draped in a sterile fashion.  Lidocaine was utilized for local anesthesia. An incision was made over the previously healed surgical incision. Utilizing blunt dissection, the port catheter and reservoir were removed from the underlying subcutaneous tissue in their entirety. The reservoir was transected from the catheter tubing. A glidewire was then advanced through the catheter tubing and deep into the inferior vena cava. The port catheter tubing was then carefully removed over the wire and remained intact. The wire was removed. Securing sutures were also removed. The pocket was irrigated with a copious amount of sterile normal saline. The pocket was closed with interrupted 3-0 Vicryl stitches. The subcutaneous tissue was closed with 3-0 Vicryl interrupted subcutaneous stitches. A 4-0 Vicryl running subcuticular stitch was utilized  to approximate the skin. Dermabond was applied. IMPRESSION: Successful right IJ vein Port-A-Cath removal. Electronically Signed   By: Jacqulynn Cadet M.D.   On: 05/28/2021 17:26   ECHOCARDIOGRAM COMPLETE  Result Date: 05/26/2021    ECHOCARDIOGRAM REPORT   Patient Name:   [name] Date of Exam: 05/26/2021 Medical Rec #:  [id number]               Height:       62.0 in Accession #:    [id number]              Weight:       324.0 lb Date of Birth:  Jul 17, 1970                BSA:          2.347 m Patient Age:    18 years                BP:           108/56 mmHg Patient Gender: F                       HR:           77 bpm. Exam Location:  Inpatient Procedure: 2D Echo, 3D Echo, Cardiac Doppler, Color Doppler and Strain Analysis Indications:    CHF-Acute Diastolic I50.38  History:        Patient has prior history of Echocardiogram examinations, most                 recent 11/25/2020. CAD, Signs/Symptoms:Bacteremia and Edema; Risk                 Factors:Diabetes and Hypertension. HFpEF, lymphedema, iron                 deficiency anemia requiring IV iron transfusion, HFpEF, moderate                 persistent  asthma, chronic hypoxia on 2 L, history of DVT.  Sonographer:    Darlina Sicilian RDCS Referring Phys: [id number] [place] T TU  Sonographer Comments: Suboptimal subcostal window and suboptimal parasternal window. IMPRESSIONS  1. Left ventricular ejection fraction, by estimation, is 60 to 65%. The left ventricle has normal function. The left ventricle has no regional wall motion abnormalities. There is mild concentric left ventricular hypertrophy. Left ventricular diastolic parameters were normal. The average left ventricular global longitudinal strain is -19.3 %. The global longitudinal strain is normal.  2. Right ventricular systolic function is normal. The right ventricular size is normal. There is normal pulmonary artery systolic pressure.  3. The mitral valve is grossly normal. No evidence of mitral valve regurgitation.  4. The aortic valve is grossly normal. Aortic valve regurgitation is not visualized. No aortic stenosis is present. FINDINGS  Left Ventricle: Left ventricular ejection fraction, by estimation, is 60 to 65%. The left ventricle has normal function. The left ventricle has no regional wall motion abnormalities. The average left ventricular global longitudinal strain is -19.3 %. The global longitudinal strain is normal. The left ventricular internal cavity size was normal in size. There is mild concentric left ventricular hypertrophy. Left ventricular diastolic parameters were normal. Right Ventricle: The right ventricular size is normal. Right vetricular wall thickness was not well visualized. Right ventricular systolic function is normal. There is normal pulmonary artery systolic pressure. The tricuspid regurgitant velocity is 2.52 m/s, and with an assumed right atrial pressure of 8 mmHg,  the estimated right ventricular systolic pressure is 33.4 mmHg. Left Atrium: Left atrial size was normal in size. Right Atrium: Right atrial size was normal in size. Pericardium: There is no evidence of pericardial  effusion. Mitral Valve: The mitral valve is grossly normal. No evidence of mitral valve regurgitation. Tricuspid Valve: The tricuspid valve is normal in structure. Tricuspid valve regurgitation is not demonstrated. Aortic Valve: The aortic valve is grossly normal. Aortic valve regurgitation is not visualized. No aortic stenosis is present. Pulmonic Valve: The pulmonic valve was not well visualized. Pulmonic valve regurgitation is not visualized. Aorta: The aortic root and ascending aorta are structurally normal, with no evidence of dilitation. IAS/Shunts: The atrial septum is grossly normal.  LEFT VENTRICLE PLAX 2D LVIDd:         5.30 cm   Diastology LVIDs:         3.90 cm   LV e' medial:    5.19 cm/s LV PW:         1.30 cm   LV E/e' medial:  13.1 LV IVS:        1.30 cm   LV e' lateral:   8.11 cm/s LVOT diam:     2.20 cm   LV E/e' lateral: 8.4 LV SV:         71 LV SV Index:   30        2D Longitudinal Strain LVOT Area:     3.80 cm²  2D Strain GLS Avg:     -19.3 %                           3D Volume EF:                          3D EF:        57 %                          LV EDV:       127 ml                          LV ESV:       55 ml                          LV SV:        72 ml RIGHT VENTRICLE RV S prime:     13.80 cm/s TAPSE (M-mode): 2.3 cm LEFT ATRIUM             Index LA diam:        4.30 cm 1.83 cm/m² LA Vol (A2C):   44.3 ml 18.88 ml/m² LA Vol (A4C):   42.7 ml 18.20 ml/m² LA Biplane Vol: 43.5 ml 18.54 ml/m²  AORTIC VALVE LVOT Vmax:   97.30 cm/s LVOT Vmean:  65.000 cm/s LVOT VTI:    0.187 m  AORTA Ao Root diam: 2.90 cm Ao Asc diam:  2.70 cm MITRAL VALVE               TRICUSPID VALVE MV Area (PHT): 3.56 cm²    TR Peak grad:   25.4 mmHg MV Decel Time: 213 msec    TR Vmax:        252.00 cm/s MV E velocity: 68.10 cm/s MV A velocity: 72.00 cm/s  SHUNTS MV E/A ratio:    0.95        Systemic VTI:  0.19 m                            Systemic Diam: 2.20 cm Philip Nahser MD Electronically signed by Philip Nahser MD  Signature Date/Time: 05/26/2021/1:31:24 PM    Final   ° °VAS US LOWER EXTREMITY VENOUS (DVT) ° °Result Date: 05/27/2021 ° Lower Venous DVT Study Patient Name:  [name]  Date of Exam:   05/26/2021 Medical Rec #: [id number]                Accession #:    [id number] Date of Birth: 07/25/1970                 Patient Gender: F Patient Age:   50 years Exam Location:  [facility] Procedure:      VAS US LOWER EXTREMITY VENOUS (DVT) Referring Phys: A POWELL JR --------------------------------------------------------------------------------  Indications: Edema.  Risk Factors: None identified. Limitations: Body habitus, poor ultrasound/tissue interface and patient positioning, patient pain tolerance. Comparison Study: No prior studies. Performing Technologist: Gregory Collins RVT  Examination Guidelines: A complete evaluation includes B-mode imaging, spectral Doppler, color Doppler, and power Doppler as needed of all accessible portions of each vessel. Bilateral testing is considered an integral part of a complete examination. Limited examinations for reoccurring indications may be performed as noted. The reflux portion of the exam is performed with the patient in reverse Trendelenburg.  +---------+---------------+---------+-----------+----------+-------------------+  RIGHT     Compressibility Phasicity Spontaneity Properties Thrombus Aging       +---------+---------------+---------+-----------+----------+-------------------+  CFV       Full            Yes       Yes                                         +---------+---------------+---------+-----------+----------+-------------------+  SFJ       Full                                                                  +---------+---------------+---------+-----------+----------+-------------------+  FV Prox   Full                                                                  +---------+---------------+---------+-----------+----------+-------------------+  FV  Mid                    Yes       Yes                                         +---------+---------------+---------+-----------+----------+-------------------+  FV Distal                 Yes       Yes                                         +---------+---------------+---------+-----------+----------+-------------------+    PFV       Full                                                                  +---------+---------------+---------+-----------+----------+-------------------+  POP       Full            Yes       Yes                                         +---------+---------------+---------+-----------+----------+-------------------+  PTV       Full                                                                  +---------+---------------+---------+-----------+----------+-------------------+  PERO                                                       Not well visualized  +---------+---------------+---------+-----------+----------+-------------------+   +---------+---------------+---------+-----------+----------+-------------------+  LEFT      Compressibility Phasicity Spontaneity Properties Thrombus Aging       +---------+---------------+---------+-----------+----------+-------------------+  CFV       Full            Yes       Yes                                         +---------+---------------+---------+-----------+----------+-------------------+  SFJ       Full                                                                  +---------+---------------+---------+-----------+----------+-------------------+  FV Prox   Full                                                                  +---------+---------------+---------+-----------+----------+-------------------+  FV Mid                    Yes       Yes                                         +---------+---------------+---------+-----------+----------+-------------------+  FV Distal                   Yes       Yes                                          +---------+---------------+---------+-----------+----------+-------------------+  PFV       Full                                                                  +---------+---------------+---------+-----------+----------+-------------------+  POP       Full            Yes       Yes                                         +---------+---------------+---------+-----------+----------+-------------------+  PTV       Full                                                                  +---------+---------------+---------+-----------+----------+-------------------+  PERO                                                       Not well visualized  +---------+---------------+---------+-----------+----------+-------------------+     Summary: RIGHT: - There is no evidence of deep vein thrombosis in the lower extremity. However, portions of this examination were limited- see technologist comments above.  - No cystic structure found in the popliteal fossa.  LEFT: - There is no evidence of deep vein thrombosis in the lower extremity. However, portions of this examination were limited- see technologist comments above.  - No cystic structure found in the popliteal fossa.  *See table(s) above for measurements and observations. Electronically signed by Servando Snare MD on 05/27/2021 at 9:28:46 AM.    Final    (Echo, Carotid, EGD, Colonoscopy, ERCP)    Subjective: Patient seen and examined.  No overnight events.  Walked around in the hallway with physical therapist.  Mobility is improved but she is very worried about going home.  Mild pain in the legs persist.  She is okay to go home with outpatient therapies.   Discharge Exam: Vitals:   06/01/21 0841 06/01/21 1301  BP:  (!) 123/57  Pulse:  85  Resp:  16  Temp:  97.9 F (36.6 C)  SpO2: 97% 96%   Vitals:   06/01/21 0428 06/01/21 0454 06/01/21 0841 06/01/21 1301  BP:  120/78  (!) 123/57  Pulse:  79  85  Resp:  18  16  Temp:  98.2 F (36.8 C)  97.9 F (36.6 C)   TempSrc:  Oral  Oral  SpO2:  96% 97% 96%  Weight: (!) 151.1 kg (!) 152 kg    Height:        General: Pt  is alert, awake, not in acute distress °On room air.  Morbidly obese. °Cardiovascular: RRR, S1/S2 +, no rubs, no gallops °Respiratory: CTA bilaterally, no wheezing, no rhonchi °Abdominal: Soft, NT, ND, bowel sounds + °Extremities:  °Chronic venous stasis changes.  Chronic lymphedema, tight legs and thighs.  Edematous but mostly nonpitting. ° ° ° °The results of significant diagnostics from this hospitalization (including imaging, microbiology, ancillary and laboratory) are listed below for reference.   ° ° °Microbiology: °Recent Results (from the past 240 hour(s))  °Resp Panel by RT-PCR (Flu A&B, Covid) Nasopharyngeal Swab     Status: None  ° Collection Time: 05/25/21  9:55 PM  ° Specimen: Nasopharyngeal Swab; Nasopharyngeal(NP) swabs in vial transport medium  °Result Value Ref Range Status  ° SARS Coronavirus 2 by RT PCR NEGATIVE NEGATIVE Final  °  Comment: (NOTE) °SARS-CoV-2 target nucleic acids are NOT DETECTED. ° °The SARS-CoV-2 RNA is generally detectable in upper respiratory °specimens during the acute phase of infection. The lowest °concentration of SARS-CoV-2 viral copies this assay can detect is °138 copies/mL. A negative result does not preclude SARS-Cov-2 °infection and should not be used as the sole basis for treatment or °other patient management decisions. A negative result may occur with  °improper specimen collection/handling, submission of specimen other °than nasopharyngeal swab, presence of viral mutation(s) within the °areas targeted by this assay, and inadequate number of viral °copies(<138 copies/mL). A negative result must be combined with °clinical observations, patient history, and epidemiological °information. The expected result is Negative. ° °Fact Sheet for Patients:  °https://www.fda.gov/media/152166/download ° °Fact Sheet for Healthcare Providers:   °https://www.fda.gov/media/152162/download ° °This test is no t yet approved or cleared by the [place] FDA and  °has been authorized for detection and/or diagnosis of SARS-CoV-2 by °FDA under an Emergency Use Authorization (EUA). This EUA will remain  °in effect (meaning this test can be used) for the duration of the °COVID-19 declaration under Section 564(b)(1) of the Act, 21 °U.S.C.section 360bbb-3(b)(1), unless the authorization is terminated  °or revoked sooner.  ° ° °  ° Influenza A by PCR NEGATIVE NEGATIVE Final  ° Influenza B by PCR NEGATIVE NEGATIVE Final  °  Comment: (NOTE) °The Xpert Xpress SARS-CoV-2/FLU/RSV plus assay is intended as an aid °in the diagnosis of influenza from Nasopharyngeal swab specimens and °should not be used as a sole basis for treatment. Nasal washings and °aspirates are unacceptable for Xpert Xpress SARS-CoV-2/FLU/RSV °testing. ° °Fact Sheet for Patients: °https://www.fda.gov/media/152166/download ° °Fact Sheet for Healthcare Providers: °https://www.fda.gov/media/152162/download ° °This test is not yet approved or cleared by the [place] FDA and °has been authorized for detection and/or diagnosis of SARS-CoV-2 by °FDA under an Emergency Use Authorization (EUA). This EUA will remain °in effect (meaning this test can be used) for the duration of the °COVID-19 declaration under Section 564(b)(1) of the Act, 21 U.S.C. °section 360bbb-3(b)(1), unless the authorization is terminated or °revoked. ° °Performed at [facility], [name] °[place], [place] [address] °  °  ° °Labs: °BNP (last 3 results) °Recent Labs  °  11/22/20 °2338 05/25/21 °1657  °BNP 46.6 76.6  ° °Basic Metabolic Panel: °Recent Labs  °Lab 05/26/21 °0445 05/27/21 °0355 05/28/21 °0851 05/29/21 °0335 05/29/21 °0520 06/01/21 °0403  °NA 135 134* 135 132*  --  131*  °K 4.1 3.7 4.2 4.0  --  3.6  °CL 97* 96* 92* 89*  --  91*  °CO2 30 31 35* 32  --  33*  °GLUCOSE 140* 134*   137* 123*  --  168*   BUN 13 23* 19 QUANTITY NOT SUFFICIENT, UNABLE TO PERFORM TEST 20 27*  CREATININE 0.87 1.10* 0.87 0.94  --  0.97  CALCIUM 8.9 8.7* 9.2 9.4  --  9.2  MG  --  2.0  --  1.9  --   --   PHOS  --  4.7*  --  3.8  --   --    Liver Function Tests: Recent Labs  Lab 05/25/21 2145 05/27/21 0355 05/28/21 0851 05/29/21 0335  AST _0 ALT _1 ALKPHOS 63 55 57 55  BILITOT 1.2 1.6* 1.3* 0.8  PROT 8.1 7.2 7.9 7.9  ALBUMIN 4.0 3.4* 3.5 3.5   No results for input(s): LIPASE, AMYLASE in the last 168 hours. No results for input(s): AMMONIA in the last 168 hours. CBC: Recent Labs  Lab 05/25/21 2145 05/26/21 0445 05/27/21 0355 05/28/21 0851 05/29/21 0335  WBC 8.7 11.5* 6.0 4.7 5.8  NEUTROABS 8.1*  --  4.6 3.4 4.4  HGB 10.9* 10.1* 9.9* 11.6* 11.3*  HCT 37.8 34.7* 33.5* 39.0 37.2  MCV 93.1 94.3 93.3 90.5 89.0  PLT 140* 136* 133* 129* 140*   Cardiac Enzymes: No results for input(s): CKTOTAL, CKMB, CKMBINDEX, TROPONINI in the last 168 hours. BNP: Invalid input(s): POCBNP CBG: Recent Labs  Lab 05/25/21 1705 05/25/21 1825 05/25/21 2139  GLUCAP 77 98 132*   D-Dimer No results for input(s): DDIMER in the last 72 hours. Hgb A1c No results for input(s): HGBA1C in the last 72 hours. Lipid Profile No results for input(s): CHOL, HDL, LDLCALC, TRIG, CHOLHDL, LDLDIRECT in the last 72 hours. Thyroid function studies No results for input(s): TSH, T4TOTAL, T3FREE, THYROIDAB in the last 72 hours.  Invalid input(s): FREET3 Anemia work up No results for input(s): VITAMINB12, FOLATE, FERRITIN, TIBC, IRON, RETICCTPCT in the last 72 hours. Urinalysis    Component Value Date/Time   COLORURINE YELLOW 11/22/2020 2210   APPEARANCEUR CLEAR 11/22/2020 2210   LABSPEC 1.025 11/22/2020 2210   PHURINE 5.0 11/22/2020 2210   GLUCOSEU NEGATIVE 11/22/2020 2210   HGBUR NEGATIVE 11/22/2020 2210   BILIRUBINUR negative 12/29/2020 0902   KETONESUR negative 12/29/2020 0902   KETONESUR NEGATIVE  11/22/2020 2210   PROTEINUR NEGATIVE 11/22/2020 2210   UROBILINOGEN 0.2 12/29/2020 0902   NITRITE Negative 12/29/2020 0902   NITRITE NEGATIVE 11/22/2020 2210   LEUKOCYTESUR Negative 12/29/2020 0902   LEUKOCYTESUR NEGATIVE 11/22/2020 2210   Sepsis Labs Invalid input(s): PROCALCITONIN,  WBC,  LACTICIDVEN Microbiology Recent Results (from the past 240 hour(s))  Resp Panel by RT-PCR (Flu A&B, Covid) Nasopharyngeal Swab     Status: None   Collection Time: 05/25/21  9:55 PM   Specimen: Nasopharyngeal Swab; Nasopharyngeal(NP) swabs in vial transport medium  Result Value Ref Range Status   SARS Coronavirus 2 by RT PCR NEGATIVE NEGATIVE Final    Comment: (NOTE) SARS-CoV-2 target nucleic acids are NOT DETECTED.  The SARS-CoV-2 RNA is generally detectable in upper respiratory specimens during the acute phase of infection. The lowest concentration of SARS-CoV-2 viral copies this assay can detect is 138 copies/mL. A negative result does not preclude SARS-Cov-2 infection and should not be used as the sole basis for treatment or other patient management decisions. A negative result may occur with  improper specimen collection/handling, submission of specimen other than nasopharyngeal swab, presence of viral mutation(s) within the areas targeted by this assay, and inadequate number of viral copies(<138 copies/mL). A negative result must  be combined with °clinical observations, patient history, and epidemiological °information. The expected result is Negative. ° °Fact Sheet for Patients:  °https://www.fda.gov/media/152166/download ° °Fact Sheet for Healthcare Providers:  °https://www.fda.gov/media/152162/download ° °This test is no t yet approved or cleared by the [place] FDA and  °has been authorized for detection and/or diagnosis of SARS-CoV-2 by °FDA under an Emergency Use Authorization (EUA). This EUA will remain  °in effect (meaning this test can be used) for the duration of the °COVID-19  declaration under Section 564(b)(1) of the Act, 21 °U.S.C.section 360bbb-3(b)(1), unless the authorization is terminated  °or revoked sooner.  ° ° °  ° Influenza A by PCR NEGATIVE NEGATIVE Final  ° Influenza B by PCR NEGATIVE NEGATIVE Final  °  Comment: (NOTE) °The Xpert Xpress SARS-CoV-2/FLU/RSV plus assay is intended as an aid °in the diagnosis of influenza from Nasopharyngeal swab specimens and °should not be used as a sole basis for treatment. Nasal washings and °aspirates are unacceptable for Xpert Xpress SARS-CoV-2/FLU/RSV °testing. ° °Fact Sheet for Patients: °https://www.fda.gov/media/152166/download ° °Fact Sheet for Healthcare Providers: °https://www.fda.gov/media/152162/download ° °This test is not yet approved or cleared by the [place] FDA and °has been authorized for detection and/or diagnosis of SARS-CoV-2 by °FDA under an Emergency Use Authorization (EUA). This EUA will remain °in effect (meaning this test can be used) for the duration of the °COVID-19 declaration under Section 564(b)(1) of the Act, 21 U.S.C. °section 360bbb-3(b)(1), unless the authorization is terminated or °revoked. ° °Performed at [facility], [name] °[place], [place] [address] °  ° ° ° °Time coordinating discharge: 40 minutes ° °SIGNED: ° ° °Kuber Ghimire, MD  °[facility] °06/01/2021, 3:26 PM  °

## 2021-06-04 ENCOUNTER — Telehealth: Payer: Self-pay

## 2021-06-04 NOTE — Telephone Encounter (Signed)
Transition Care Management Unsuccessful Follow-up Telephone Call  Date of discharge and from where:  06/01/2021 from [facility]  Attempts:  1st Attempt  Reason for unsuccessful TCM follow-up call:  Left voice message

## 2021-06-05 ENCOUNTER — Other Ambulatory Visit: Payer: Self-pay | Admitting: Physician Assistant

## 2021-06-05 ENCOUNTER — Other Ambulatory Visit: Payer: Self-pay | Admitting: Internal Medicine

## 2021-06-05 DIAGNOSIS — D500 Iron deficiency anemia secondary to blood loss (chronic): Secondary | ICD-10-CM

## 2021-06-05 DIAGNOSIS — I2510 Atherosclerotic heart disease of native coronary artery without angina pectoris: Secondary | ICD-10-CM

## 2021-06-05 DIAGNOSIS — I5032 Chronic diastolic (congestive) heart failure: Secondary | ICD-10-CM

## 2021-06-05 NOTE — Telephone Encounter (Signed)
Transition Care Management Follow-up Telephone Call Date of discharge and from where: 06/01/2021 from [facility] How have you been since you were released from the hospital? Patient stated that she is feeling well.  Any questions or concerns? No  Items Reviewed: Did the pt receive and understand the discharge instructions provided? Yes  Medications obtained and verified? Yes  Other? No  Any new allergies since your discharge? No  Dietary orders reviewed? No Do you have support at home? Yes   Functional Questionnaire: (I = Independent and D = Dependent) ADLs: I  Bathing/Dressing- I  Meal Prep- I  Eating- I  Maintaining continence- I  Transferring/Ambulation- I  Managing Meds- I   Follow up appointments reviewed:  PCP Hospital f/u appt confirmed? No   Specialist Hospital f/u appt confirmed? Yes  Scheduled to see Glenetta Hew, MD on 07/11/2021 @ 10:30am.  Patient informed to reach out to [facility] for a sooner appt. Patient stated u nderstanding and agreed.  Are transportation arrangements needed? No  If their condition worsens, is the pt aware to call PCP or go to the Emergency Dept.? Yes Was the patient provided with contact information for the PCP's office or ED? Yes Was to pt encouraged to call back with questions or concerns? Yes

## 2021-06-06 NOTE — Telephone Encounter (Signed)
Requested Prescriptions  Pending Prescriptions Disp Refills   spironolactone (ALDACTONE) 25 MG tablet [Pharmacy Med Name: SPIRONOLACTONE 25 MG TABLET] 30 tablet 1    Sig: TAKE ONE TABLET BY MOUTH DAILY     Cardiovascular: Diuretics - Aldosterone Antagonist Failed - 06/05/2021  3:14 PM      Failed - Na in normal range and within 180 days    Sodium  Date Value Ref Range Status  06/01/2021 131 (L) 135 - 145 mmol/L Final  11/29/2020 138 134 - 144 mmol/L Final         Passed - Cr in normal range and within 180 days    Creatinine  Date Value Ref Range Status  02/22/2021 0.98 0.44 - 1.00 mg/dL Final   Creatinine, Ser  Date Value Ref Range Status  06/01/2021 0.97 0.44 - 1.00 mg/dL Final         Passed - K in normal range and within 180 days    Potassium  Date Value Ref Range Status  06/01/2021 3.6 3.5 - 5.1 mmol/L Final         Passed - eGFR is 30 or above and within 180 days    GFR calc Af Amer  Date Value Ref Range Status  01/20/2020 >60 >60 mL/min Final   GFR, Estimated  Date Value Ref Range Status  06/01/2021 >60 >60 mL/min Final    Comment:    (NOTE) Calculated using the CKD-EPI Creatinine Equation (2021)   02/22/2021 >60 >60 mL/min Final    Comment:    (NOTE) Calculated using the CKD-EPI Creatinine Equation (2021)    eGFR  Date Value Ref Range Status  11/29/2020 93 >59 mL/min/1.73 Final         Passed - Last BP in normal range    BP Readings from Last 1 Encounters:  06/01/21 (!) 123/57         Passed - Valid encounter within last 6 months    Recent Outpatient Visits          3 months ago Iron deficiency anemia due to chronic blood loss   [place], Deborah B, MD   5 months ago Preoperative evaluation to rule out surgical contraindication   [place], Deborah B, MD   6 months ago Type 2 diabetes mellitus with hyperglycemia, unspecified whether long term insulin use [facility])    [place], [place], [place]   11 months ago Chronic midline thoracic back pain   [place], Deborah B, MD   1 year ago Establishing care with new doctor, encounter for   [place], MD      Future Appointments            In 1 month Leonie Man, MD [facility], [facility]            FEROSUL 325 (65 Fe) MG tablet [Pharmacy Med Name: FEROSUL 325 MG TABLET] 100 tablet 0    Sig: TAKE ONE TABLET BY MOUTH DAILY WITH BREAKFAST     Endocrinology:  Minerals - Iron Supplementation Failed - 06/05/2021  3:14 PM      Failed - HGB in normal range and within 360 days    Hemoglobin  Date Value Ref Range Status  05/29/2021 11.3 (L) 12.0 - 15.0 g/dL Final  03/01/2021 10.7 (L) 11.1 - 15.9 g/dL Final  Passed - HCT in normal range and within 360 days    HCT  Date Value Ref Range Status  05/29/2021 37.2 36.0 - 46.0 % Final   Hematocrit  Date Value Ref Range Status  03/01/2021 36.3 34.0 - 46.6 % Final         Passed - RBC in normal range and within 360 days    RBC  Date Value Ref Range Status  05/29/2021 4.18 3.87 - 5.11 MIL/uL Final         Passed - Fe (serum) in normal range and within 360 days    Iron  Date Value Ref Range Status  02/22/2021 48 41 - 142 ug/dL Final   Saturation Ratios  Date Value Ref Range Status  02/22/2021 12 (L) 21 - 57 % Final         Passed - Ferritin in normal range and within 360 days    Ferritin  Date Value Ref Range Status  02/22/2021 24 11 - 307 ng/mL Final    Comment:    Performed at [facility], [place] [address] [place], [place] [address]         Passed - Valid encounter within last 12 months    Recent Outpatient Visits          3 months ago Iron deficiency anemia due to chronic blood loss   [place] Ladell Pier, MD   5 months ago  Preoperative evaluation to rule out surgical contraindication   [place] [name] B, MD   6 months ago Type 2 diabetes mellitus with hyperglycemia, unspecified whether long term insulin use [facility])   [place], [place], [place]   11 months ago Chronic midline thoracic back pain   [place] Ladell Pier, MD   1 year ago Establishing care with new doctor, encounter for   [place], MD      Future Appointments            In 1 month Ellyn Hack Leonie Green, MD [place] [facility], [facility]           \

## 2021-06-06 NOTE — Telephone Encounter (Signed)
Requested medication (s) are due for refill today: yes  Requested medication (s) are on the active medication list: yes  Last refill:  11/29/20 #90 with 1 RF  Future visit scheduled: no  Notes to clinic:  Failed protocol of labs within 360 days, (Lipids not on record) no upcoming appt, please assess.   Requested Prescriptions  Pending Prescriptions Disp Refills   atorvastatin (LIPITOR) 40 MG tablet [Pharmacy Med Name: ATORVASTATIN 40 MG TABLET] 90 tablet 1    Sig: TAKE 1 TABLET BY MOUTH DAILY     Cardiovascular:  Antilipid - Statins Failed - 06/05/2021  3:14 PM      Failed - Lipid Panel in normal range within the last 12 months    No results found for: CHOL, POCCHOL, CHOLTOT No results found for: LDLCALC, LDLC, HIRISKLDL, POCLDL, LDLDIRECT, REALLDLC, TOTLDLC No results found for: HDL, POCHDL No results found for: TRIG, POCTRIG       Passed - Patient is not pregnant      Passed - Valid encounter within last 12 months    Recent Outpatient Visits           3 months ago Iron deficiency anemia due to chronic blood loss   [place], MD   5 months ago Preoperative evaluation to rule out surgical contraindication   [place] [name] B, MD   6 months ago Type 2 diabetes mellitus with hyperglycemia, unspecified whether long term insulin use [facility])   [place], [place], [place]   11 months ago Chronic midline thoracic back pain   [place] Ladell Pier, MD   1 year ago Establishing care with new doctor, encounter for   [place], MD       Future Appointments             In 1 month Ellyn Hack Leonie Green, MD [place] [facility], [facility]

## 2021-06-12 ENCOUNTER — Ambulatory Visit: Payer: Self-pay | Admitting: *Deleted

## 2021-06-12 NOTE — Telephone Encounter (Signed)
Reason for Disposition  Medicine patch causing local rash or itching    Lidocaine 5% patch causing itching.  Answer Assessment - Initial Assessment Questions 1. NAME of MEDICATION: "What medicine are you calling about?"     Lidocaine patch 5% is causing me to itch.     2. QUESTION: "What is your question?" (e.g., double dose of medicine, side effect)     A few minutes after I put it on to stay on for the 12 hours it starts itching and I have to take it off.   Is there something else she can prescribe? 3. PRESCRIBING HCP: "Who prescribed it?" Reason: if prescribed by specialist, call should be referred to that group.     Dr. [name] 4. SYMPTOMS: "Do you have any symptoms?"     Itching.    No redness or swelling when removes the patch just itching. 5. SEVERITY: If symptoms are present, ask "Are they mild, moderate or severe?"     Severe itching 6. PREGNANCY:  "Is there any chance that you are pregnant?" "When was your last menstrual period?"     N/A  Protocols used: Medication Question Call-A-AH

## 2021-06-12 NOTE — Telephone Encounter (Signed)
Left voice mail to call back 

## 2021-06-12 NOTE — Telephone Encounter (Signed)
I returned pt's call.    Called in c/o Lidocaine 5% making her itch.   Requesting another medication.

## 2021-06-13 ENCOUNTER — Encounter: Payer: Self-pay | Admitting: Internal Medicine

## 2021-06-13 ENCOUNTER — Other Ambulatory Visit: Payer: Self-pay | Admitting: Internal Medicine

## 2021-06-13 NOTE — Telephone Encounter (Signed)
Returned pt call and went over provider message. Pt states every time she goes to [facility] they tell her she is needing a written order for xray. Made pt aware that she doesn't need to have a rx for xray. Pt states that is what she is being told. Made pt aware that I will contact [facility] and see what is going on. Contacted the radiology dept and spoke to [place] and per [place] she was able to see order in Epic and if pt is having a difficult time getting xray done she can contact the central scheduling dept nd scheduled the xray. Returned pt call and provided the information I was given and provided pt the number to call and scheduled. Pt states she understands and doesn't have any questions or concerns

## 2021-06-13 NOTE — Telephone Encounter (Signed)
Will forward to provider  

## 2021-06-19 ENCOUNTER — Ambulatory Visit (HOSPITAL_COMMUNITY)
Admission: RE | Admit: 2021-06-19 | Discharge: 2021-06-19 | Disposition: A | Discharge status: Home / Self Care | Payer: Medicaid Other | Source: Ambulatory Visit | Attending: Nurse Practitioner | Admitting: Nurse Practitioner

## 2021-06-19 ENCOUNTER — Other Ambulatory Visit: Payer: Self-pay

## 2021-06-19 DIAGNOSIS — M546 Pain in thoracic spine: Secondary | ICD-10-CM | POA: Insufficient documentation

## 2021-06-19 DIAGNOSIS — G8929 Other chronic pain: Secondary | ICD-10-CM | POA: Insufficient documentation

## 2021-06-20 ENCOUNTER — Other Ambulatory Visit: Payer: Self-pay | Admitting: Internal Medicine

## 2021-06-20 ENCOUNTER — Other Ambulatory Visit: Payer: Self-pay | Admitting: Nurse Practitioner

## 2021-06-20 DIAGNOSIS — G8929 Other chronic pain: Secondary | ICD-10-CM

## 2021-06-20 DIAGNOSIS — M546 Pain in thoracic spine: Secondary | ICD-10-CM

## 2021-06-20 DIAGNOSIS — R937 Abnormal findings on diagnostic imaging of other parts of musculoskeletal system: Secondary | ICD-10-CM

## 2021-06-21 ENCOUNTER — Other Ambulatory Visit: Payer: Self-pay

## 2021-06-21 ENCOUNTER — Encounter: Payer: Medicaid Other | Attending: Internal Medicine | Admitting: Dietician

## 2021-06-21 DIAGNOSIS — E119 Type 2 diabetes mellitus without complications: Secondary | ICD-10-CM | POA: Diagnosis present

## 2021-06-21 DIAGNOSIS — Z6841 Body Mass Index (BMI) 40.0 and over, adult: Secondary | ICD-10-CM | POA: Insufficient documentation

## 2021-06-21 NOTE — Patient Instructions (Addendum)
Great job on changes made! Continue lifestyle change! Consider choosing 1 piece of fresh fruit rather than juice. Continue to bake or boil rather than fry Continue to eat more vegetables Continue to stay active. Continue to make mindful choices. When eating, ask, "When do I feel full?"

## 2021-06-21 NOTE — Progress Notes (Signed)
Medical Nutrition Therapy  Appointment Start time:  [phone]  Appointment End time:  1520 She was last seen by this RD on 04/27/2022 Primary concerns today: . She would like to lose weight.  She states that she did not qualify for weight loss surgery due to high risk. [name] Referral diagnosis: Type 2 Diabetes with obesity Preferred learning style: no preference indicated Learning readiness: ready   NUTRITION ASSESSMENT  Patient states that she is doeing well No bread, little pasta Drinks v-8, and fruit juice Baked and broiled and less fried foods Imporved nutrition quality She spoke to her brother who is a Tax adviser.  He is a vegetarian.  She is trying to wean off meat. Walking the dog to the park 10 blocks 2 days per week and does the armchair videos 3 times per day. Sleep is now good and got 11 hours of sleep last night. Her mother is supportive and is eating like she is. When she used to eat, she was eating out of grief for her father and was caring for others more.  She is working on her own self care. Weight 247 lbs at home.  Anthropometrics  62" 351 lbs 06/21/2021 and 347 lbs at home  345 lbs 04/27/2021  Obese since 51 yo and gained more weight since her father died in July 26, 2003 389 lbs 3 weeks ago States that she lost by stopping eating due to frustration over her weight.  Clinical Medical Hx: Type 2 Diabetes 07-25-2000), OSA (just tested and not on C-pap yet), iron deficient anemia, CAD, cellulitis, CHF Medications: see list to include Farxiga, lasix, spironolactone, iron, vitamin B-12, Klor con, vitamin D Labs: 6.5% 11/24/2020 Notable Signs/Symptoms: 165 fasting glucose  Lifestyle & Dietary Hx Patient lives with her mother.  They share shopping and cooking.   She does not drive and takes an [place] or her mother takes her. She is on food stamps.  Sleep: Now sleeping well compared to 03/2021 visit:   "not sleeping at all"  4 am-6 am - "so tired, can't fall asleep", Sometimes she is scared  to sleep for fear of not waking up. Stress / self-care: high.   Current average weekly physical activity:  Takes them to the dog park (10 blocks away) twice a week and is doing armchair exercise videos 3 times per day.  24-Hr Dietary Recall Lactose intolerant Does not use added salt. Trying to eat 1 meal per day First Meal: Egg on toast, OJ Snack:  Second skipped as not hungry Snack: apple and orange Third Meal: vegetarian spaghetti, garlic bread, big salad Snack: none, trying to avoid Beverages: water, coffee with 1 sugar, juice  NUTRITION DIAGNOSIS  NB-1.1 Food and nutrition-related knowledge deficit As related to balance of carbohydrates, protein, and fat for weight loss.  As evidenced by diet hx and patient report.   NUTRITION INTERVENTION  Nutrition education (E-1) on the following topics:  Encouraged her to continue to stay active and benefits on blood glucose Encouraged her to continue lifestyle changes  Reviewed mindfulness, choices and hunger and fullness cues Discussed beverage choices to avoid those with carbohydrates  Handouts Provided Include:initial visit  Anemia nutrition Therapy from AND Snack sheet My plate ADA book How to Thrive:  A guide for your journey with diabetes Diabetes resource page Heart healthy, consistent carbohydrate nutrition therapy from AND Meal plan card  Learning Style & Readiness for Change Teaching method utilized: Visual & Auditory  Demonstrated degree of understanding via: Teach Back  Barriers to  learning/adherence to lifestyle change: lifelong habits, motivation  Goals . Great job on changes made! Continue lifestyle change! Consider choosing 1 piece of fresh fruit rather than juice. Continue to bake or boil rather than fry Continue to eat more vegetables Continue to stay active. Continue to make mindful choices. When eating, ask, "When do I feel full?"  MONITORING & EVALUATION Dietary intake, weekly physical activity, and  label reading in 2 months.  Next Steps  Patient is to call for questions.

## 2021-06-22 ENCOUNTER — Ambulatory Visit: Payer: Medicaid Other | Attending: Internal Medicine

## 2021-06-22 DIAGNOSIS — Z23 Encounter for immunization: Secondary | ICD-10-CM

## 2021-06-22 NOTE — Progress Notes (Signed)
° °  Covid-19 Vaccination Clinic  Name:  Avonda Toso    MRN: [id number] DOB: Oct 12, 1970  06/22/2021  Ms. Predmore was observed post Covid-19 immunization for 15 minutes without incident. She was provided with Vaccine Information Sheet and instruction to access the V-Safe system.   Ms. Tomczak was instructed to call 911 with any severe reactions post vaccine: Difficulty breathing  Swelling of face and throat  A fast heartbeat  A bad rash all over body  Dizziness and weakness   Immunizations Administered     Name Date Dose VIS Date Route   PFIZER Comrnaty(Gray TOP) Covid-19 Vaccine 06/22/2021  4:06 PM 0.3 mL 12/27/2020 Intramuscular   Manufacturer: [place]   Lot: [id number]   NDC: [id number]

## 2021-06-26 ENCOUNTER — Ambulatory Visit (INDEPENDENT_AMBULATORY_CARE_PROVIDER_SITE_OTHER): Payer: Medicaid Other | Admitting: Physician Assistant

## 2021-06-26 ENCOUNTER — Encounter: Payer: Self-pay | Admitting: Physician Assistant

## 2021-06-26 ENCOUNTER — Encounter: Payer: Self-pay | Admitting: Hematology

## 2021-06-26 ENCOUNTER — Other Ambulatory Visit (HOSPITAL_BASED_OUTPATIENT_CLINIC_OR_DEPARTMENT_OTHER): Payer: Self-pay

## 2021-06-26 ENCOUNTER — Other Ambulatory Visit: Payer: Self-pay

## 2021-06-26 DIAGNOSIS — M5440 Lumbago with sciatica, unspecified side: Secondary | ICD-10-CM | POA: Diagnosis not present

## 2021-06-26 MED ORDER — HYDROCODONE-ACETAMINOPHEN 5-325 MG PO TABS: 1.0000 | ORAL_TABLET | Freq: Two times a day (BID) | ORAL | 0 refills | Status: DC | PRN

## 2021-06-26 MED ORDER — PFIZER-BIONT COVID-19 VAC-TRIS 30 MCG/0.3ML IM SUSP
INTRAMUSCULAR | 0 refills | Status: DC
  Filled 2021-06-26: qty 0.3, 1d supply, fill #0
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
## 2021-07-02 ENCOUNTER — Other Ambulatory Visit: Payer: Self-pay | Admitting: Obstetrics and Gynecology

## 2021-07-02 ENCOUNTER — Other Ambulatory Visit: Payer: Self-pay

## 2021-07-02 NOTE — Patient Instructions (Signed)
Hi Ms. Kissner, it was a pleasure to speak with you today-have a great afternoon! ? ?Ms. Dant was given information about Medicaid Managed Care team care coordination services as a part of their [place] Medicaid benefit. Kaitlan Damaris Douthitt verbally consented to engagement with the [facility] Managed Care team.  ? ?If you are experiencing a medical emergency, please call 911 or report to your local emergency department or urgent care.  ? ?If you have a non-emergency medical problem during routine business hours, please contact your provider's office and ask to speak with a nurse.  ? ?For questions related to your [facility], please call: [phone] or visit the homepage here: [email or web address] ? ?If you would like to schedule transportation through your [facility], please call the following number at least 2 days in advance of your appointment: [phone]. ? Rides for urgent appointments can also be made after hours by calling Member Services. ? ?Call the [name] at [phone], at any time, 24 hours a day, 7 days a week. If you are in danger or need immediate medical attention call 911. ? ?If you would like help to quit smoking, call 1-800-QUIT-NOW (873) 173-8962) OR Espa?ol: 1-855-D?jelo-Ya (901)883-7944) o para m?s informaci?n haga clic aqu? or Text READY to 200-400 to register via text ? ?Ms. [name] - following are the goals we discussed in your visit today:  ? Goals Addressed   ? ?Long-Range Goal: Establish Plan of Care for Chronic Disease Management Needs   ?Start Date: 07/02/2021  ?Expected End Date: 10/02/2021  ?Priority: High  ?Note:   ?Timeframe:  Long-Range Goal ?Priority:  High ?Start Date:     07/02/21                        ?Expected End Date:     ongoing                 ? ?Follow Up Date 08/02/21  ?  ?- schedule appointment for flu shot ?-  schedule appointment for vaccines needed due to my age or health ?- schedule recommended health tests (blood work, mammogram, colonoscopy, pap test) ?- schedule and keep appointment for annual check-up  ?  ?Why is this important?   ?Screening tests can find diseases early when they are easier to treat.  ?Your doctor or nurse will talk with you about which tests are important for you.  ?Getting shots for common diseases like the flu and shingles will help prevent them.    ? ?Patient verbalizes understanding of instructions and care plan provided today and agrees to view in [place]. Active MyChart status confirmed with patient.   ? ?The Managed Medicaid care management team will reach out to the patient again over the next 30 days.  ?The  Patient   has been provided with contact information for the Managed Medicaid care management team and has been advised to call with any health related questions or concerns.  ? ?[name], BSN ?[facility] ?Care Management Coordinator - Managed Medicaid High Risk ?[phone] ? ?Following is a copy of your plan of care:  ?Care Plan : [place] of Care  ?Updates made by [name], RN since 07/02/2021 12:00 AM  ?  ? ?Problem: Chronic Disease Management and Care Coordination Needs   ?Priority: High  ?  ? ?Long-Range Goal: Self-Management Plan Developed   ?Start Date:  07/02/2021  ?Expected End Date: 10/02/2021  ?This Visit's Progress: Not on track  ?Priority: High  ?Note:   ?Current Barriers:  ?Knowledge Deficits related to plan of care for management of back pain  ?Care Coordination needs related to Limited support and back pain ?Chronic Disease Management support and education needs related to back pain  ? ?RNCM Clinical Goal(s):  ?Patient will verbalize understanding of plan for management of back pain as evidenced by patient report and attending PT sessions ?take all medications exactly as prescribed and will call provider for medication  related questions as evidenced by patient report ?attend all scheduled medical appointments as evidenced by patient report ?continue to work with RN Care Manager to address care management and care coordination needs related to  back pain as evidenced by adherence to CM Team Scheduled appointments ?work with Education officer, museum to address  Limited support and hisory of anxiety/depression related to the management of back pain  as evidenced by review of EMR and patient or social worker report through collaboration with Consulting civil engineer, provider, and care team.  ? ?Interventions: ?Inter-disciplinary care team collaboration (see longitudinal plan of care) ?Evaluation of current treatment plan related to  self management and patient's adherence to plan as established by provider ? ?Pain Interventions:  (Status:  New goal.) Long Term Goal ?Pain assessment performed ?Medications reviewed ?Reviewed provider established plan for pain management ?Discussed importance of adherence to all scheduled medical appointments ?Counseled on the importance of reporting any/all new or changed pain symptoms or management strategies to pain management provider ?Advised patient to report to care team affect of pain on daily activities ?Discussed use of relaxation techniques and/or diversional activities to assist with pain reduction (distraction, imagery, relaxation, massage, acupressure, TENS, heat, and cold application ?Reviewed with patient prescribed pharmacological and nonpharmacological pain relief strategies ?Assessed social determinant of health barriers ? ?Patient Goals/Self-Care Activities: ?Take all medications as prescribed ?Attend all scheduled provider appointments ?Call pharmacy for medication refills 3-7 days in advance of running out of medications ?Perform all self care activities independently  ?Perform IADL's (shopping, preparing meals, housekeeping, managing finances) independently ?Call provider office for new concerns or  questions  ?Work with the Education officer, museum to address care coordination needs and will continue to work with the clinical team to address health care and disease management related needs ? ?Follow Up Plan:  The patient has been provided with contact information for the care management team and has been advised to call with any health related questions or concerns.  ?The care management team will reach out to the patient again over the next 30 days.   ? ? ?

## 2021-07-02 NOTE — Patient Outreach (Signed)
Medicaid Managed Care   Nurse Care Manager Note  07/02/2021 Name:  [name] MRN:  [id number] DOB:  [birth year]  [name] is an 51 y.o. year old female who is a primary patient of Ladell Pier, MD.  The [facility] Managed Care Coordination team was consulted for assistance with:    Chronic healthcare management needs, back pain, h/o anxiety/depression, HTN, asthma, CHF, lymphedema  [name] was given information about Medicaid Managed Care Coordination team services today. [place] Patient agreed to services and verbal consent obtained.  Engaged with patient by telephone for initial visit in response to provider referral for case management and/or care coordination services.   Assessments/Interventions:  Review of past medical history, allergies, medications, health status, including review of consultants reports, laboratory and other test data, was performed as part of comprehensive evaluation and provision of chronic care management services.  SDOH (Social Determinants of Health) assessments and interventions performed: SDOH Interventions    Flowsheet Row Most Recent Value  SDOH Interventions   Intimate Partner Violence Interventions Intervention Not Indicated      Care Plan  Allergies  Allergen Reactions   Ace Inhibitors Rash and Other (See Comments)    Make pt bleed   Aspirin Other (See Comments)    Per patient paperwork: blood clot?  Likely because of chronic DOAC   Hydromorphone Hives and Itching   Vancomycin Itching and Rash   Contrast Media [Iodinated Contrast Media] Hives   Dilaudid [Hydromorphone Hcl] Hives   Lidocaine Itching    Itching with Lidocaine patch reported 06/13/2021 vis telephone message.   Medications Reviewed Today     Reviewed by [name], RN (Registered Nurse) on 07/02/21 at 8  Med List Status: <None>   Medication Order Taking? Sig Documenting Provider Last Dose Status Informant  Accu-Chek  Softclix Lancets lancets [id number] Yes Use as instructed  Patient taking differently: 1 each by Other route See admin instructions. Use as instructed   Argentina Donovan, PA-C Taking Active Self  acetaminophen (TYLENOL) 500 MG tablet [id number] Yes Take 1 tablet (500 mg total) by mouth every 6 (six) hours as needed.  Patient taking differently: Take 500 mg by mouth every 6 (six) hours as needed for mild pain.   Ladell Pier, MD Taking Active Self  albuterol [facility] HFA) [phone] Base) MCG/ACT inhaler [id number] Yes INHALE TWO PUFFS BY MOUTH EVERY 6 HOURS AS NEEDED FOR WHEEZING OR SHORTNESS OF BREATH  Patient taking differently: Inhale 2 puffs into the lungs See admin instructions. INHALE TWO PUFFS BY MOUTH EVERY 6 HOURS AS NEEDED FOR WHEEZING OR SHORTNESS OF Brennan Bailey, MD Taking Active Self  amLODipine (NORVASC) 5 MG tablet [id number] Yes TAKE 1/2 TABLET BY MOUTH DAILY  Patient taking differently: Take 2.5 mg by mouth daily.   Ladell Pier, MD Taking Active Self  atorvastatin (LIPITOR) 40 MG tablet [id number] Yes TAKE 1 TABLET BY MOUTH DAILY Ladell Pier, MD Taking Active   Blood Glucose Calibration (ACCU-CHEK GUIDE CONTROL) LIQD [id number] Yes 1 each by In Vitro route daily. Argentina Donovan, PA-C Taking Active Self  Blood Glucose Monitoring Suppl (ACCU-CHEK GUIDE) w/Device KIT [id number] Yes 1 each by Does not apply route 2 (two) times daily. Argentina Donovan, PA-C Taking Active Self  Carboxymethylcellulose Sodium (EYE DROPS OP) [id number] Yes Place 1 drop into both eyes daily as needed (dry eyes). [provider] Taking Active Self  carvedilol (COREG) 25 MG tablet [id number]  Yes TAKE ONE TABLET BY MOUTH TWICE A DAY WITH MEALS  Patient taking differently: Take 25 mg by mouth 2 (two) times daily with a meal.   Ladell Pier, MD Taking Active Self  cholecalciferol (VITAMIN D3) 25 MCG (1000 UNIT) tablet [id number] Yes Take 1 tablet (1,000 Units total) by  mouth daily. Ladell Pier, MD Taking Active Self  COVID-19 mRNA Vac-TriS, Pfizer, (PFIZER-BIONT COVID-19 VAC-TRIS) SUSP injection [id number]  Inject into the muscle. Carlyle Basques, MD  Active   dapagliflozin propanediol (FARXIGA) 10 MG TABS tablet [id number] Yes Take 1 tablet (10 mg total) by mouth daily. Ladell Pier, MD Taking Active Self  DULERA 200-5 MCG/ACT Hollie Salk [id number] Yes INHALE 2 PUFFS INTO THE LUNGS TWO TIMES A DAY  Patient taking differently: Inhale 2 puffs into the lungs daily.   Ladell Pier, MD Taking Active Self  ELIQUIS 5 MG TABS tablet [id number] Yes TAKE ONE TABLET BY MOUTH TWICE A DAY  Patient taking differently: Take 5 mg by mouth 2 (two) times daily.   Ladell Pier, MD Taking Active Self  FEROSUL 325 (65 Fe) MG tablet [id number] Yes TAKE ONE TABLET BY MOUTH DAILY WITH BREAKFAST Ladell Pier, MD Taking Active   furosemide (LASIX) 80 MG tablet [id number] Yes Take 1 tablet (80 mg total) by mouth daily. Take additional dose of lasix for weight gain, leg swelling Barb Merino, MD Taking Active   gabapentin (NEURONTIN) 100 MG capsule [id number] Yes TAKE 1 CAPSULE BY MOUTH THREE TIMES A DAY AS NEEDED FOR PAN  Patient taking differently: Take 100 mg by mouth 3 (three) times daily as needed (pain).   Ladell Pier, MD Taking Active Self  glucose blood test strip [id number] Yes Use as instructed  Patient taking differently: 1 each by Other route See admin instructions. Use as instructed   Argentina Donovan, PA-C Taking Active Self  HYDROcodone-acetaminophen (NORCO/VICODIN) 5-325 MG tablet [id number] No Take 1 tablet by mouth every 12 (twelve) hours as needed for moderate pain (severe pain).  Patient not taking: Reported on 07/02/2021   Persons, Bevely Palmer, [place] Not Taking Active   hydrocortisone cream 1 % [id number] Yes Apply 1 application topically daily as needed for itching. [provider] Taking Active Self  hydrOXYzine (VISTARIL) 25 MG capsule  [id number] Yes Take 1 capsule (25 mg total) by mouth at bedtime as needed.  Patient taking differently: Take 25 mg by mouth at bedtime as needed for anxiety or itching.   Ladell Pier, MD Taking Active Self  KLOR-CON M20 20 MEQ tablet [id number] Yes TAKE ONE TABLET BY MOUTH DAILY  Patient taking differently: Take 20 mEq by mouth daily.   Ladell Pier, MD Taking Active Self  methocarbamol (ROBAXIN) 500 MG tablet [id number] Yes TAKE ONE TABLET BY MOUTH DAILY AS NEEDED FOR MUSCLE SPASMS  Patient taking differently: Take 500 mg by mouth See admin instructions. TAKE ONE TABLET BY MOUTH DAILY AS NEEDED FOR MUSCLE SPASMS   Ladell Pier, MD Taking Active Self  nitroGLYCERIN (NITROSTAT) 0.4 MG SL tablet [id number]  Place 1 tablet (0.4 mg total) under the tongue every 5 (five) minutes as needed for chest pain. Aline August, MD  Active Self  omeprazole (PRILOSEC) 20 MG capsule [id number]  Take 1 capsule (20 mg total) by mouth daily as needed (acid reflux). Fenton Foy, NP  Expired 05/31/21 2359 Self  PARoxetine (PAXIL) 10 MG tablet [id number] Yes TAKE ONE TABLET BY MOUTH DAILY  Patient taking differently: Take  10 mg by mouth daily.   Ladell Pier, MD Taking Active Self  spironolactone (ALDACTONE) 25 MG tablet [id number] Yes TAKE ONE TABLET BY MOUTH DAILY Ladell Pier, MD Taking Active   valsartan (DIOVAN) 160 MG tablet [id number] Yes Take 1 tablet (160 mg total) by mouth daily. Leonie Man, MD Taking Active Self  vitamin B-12 (CYANOCOBALAMIN) 1000 MCG tablet [id number] Yes Take 1,000 mcg by mouth daily. [provider] Taking Active Self           Patient Active Problem List   Diagnosis Date Noted   Depression 05/28/2021   Encounter for care related to Port-a-Cath 05/27/2021   Acute on chronic respiratory failure with hypoxia ([place]) 05/26/2021   Morbid obesity with BMI of 50.0-59.9, adult ([place]) 05/26/2021   Acute CHF (congestive heart failure) ([place])  05/25/2021   Acute respiratory failure ([place]) 11/23/2020   Acute respiratory failure with hypoxia ([place]) 11/22/2020   Fever 11/22/2020   Bilateral lower extremity edema 11/22/2020   History of DVT (deep vein thrombosis) 11/22/2020   Asthma, chronic, moderate persistent, with acute exacerbation 11/22/2020   Lower limb ulcer, calf, left, limited to breakdown of skin ([place]) 06/09/2020   Lymphedema 06/09/2020   Iron deficiency anemia due to chronic blood loss 05/10/2020   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 ([place]) 04/13/2020   Venous insufficiency 04/13/2020   S/P insertion of IVC (inferior vena caval) filter 04/13/2020   Chronic anemia 04/13/2020   Essential hypertension 04/13/2020   (HFpEF) heart failure with preserved ejection fraction ([place]) 04/13/2020   CAD (coronary artery disease) 11/17/2019   Conditions to be addressed/monitored per PCP order:  Chronic healthcare management needs, back pain, h/o anxiety/depression, HTN, asthma, CHF, lymphedema, CAD  Care Plan : RN Care Manager Plan of Care  Updates made by [name], RN since 07/02/2021 12:00 AM     Problem: Chronic Disease Management and Care Coordination Needs   Priority: High     Long-Range Goal: Self-Management Plan Developed   Start Date: 07/02/2021  Expected End Date: 10/02/2021  This Visit's Progress: Not on track  Priority: High  Note:   Current Barriers:  Knowledge Deficits related to plan of care for management of back pain  Care Coordination needs related to Limited support and back pain Chronic Disease Management support and education needs related to back pain   RNCM Clinical Goal(s):  Patient will verbalize understanding of plan for management of back pain as evidenced by patient report and attending PT sessions take all medications exactly as prescribed and will call provider for medication related questions as evidenced by patient report attend all scheduled medical  appointments as evidenced by patient report continue to work with RN Care Manager to address care management and care coordination needs related to  back pain as evidenced by adherence to CM Team Scheduled appointments work with Education officer, museum to address  Limited support and hisory of anxiety/depression related to the management of back pain  as evidenced by review of EMR and patient or Education officer, museum report through collaboration with Consulting civil engineer, provider, and care team.   Interventions: Inter-disciplinary care team collaboration (see longitudinal plan of care) Evaluation of current treatment plan related to  self management and patient's adherence to plan as established by provider  Pain Interventions:  (Status:  New goal.) Long Term Goal Pain assessment performed Medications reviewed Reviewed provider established plan for pain management Discussed importance of adherence to all scheduled  medical appointments Counseled on the importance of reporting any/all new or changed pain symptoms or management strategies to pain management provider Advised patient to report to care team affect of pain on daily activities Discussed use of relaxation techniques and/or diversional activities to assist with pain reduction (distraction, imagery, relaxation, massage, acupressure, TENS, heat, and cold application Reviewed with patient prescribed pharmacological and nonpharmacological pain relief strategies Assessed social determinant of health barriers  Patient Goals/Self-Care Activities: Take all medications as prescribed Attend all scheduled provider appointments Call pharmacy for medication refills 3-7 days in advance of running out of medications Perform all self care activities independently  Perform IADL's (shopping, preparing meals, housekeeping, managing finances) independently Call provider office for new concerns or questions  Work with the social worker to address care coordination needs and  will continue to work with the clinical team to address health care and disease management related needs  Follow Up Plan:  The patient has been provided with contact information for the care management team and has been advised to call with any health related questions or concerns.  The care management team will reach out to the patient again over the next 30 days.     Long-Range Goal: Establish Plan of Care for Chronic Disease Management Needs   Start Date: 07/02/2021  Expected End Date: 10/02/2021  Priority: High  Note:   Timeframe:  Long-Range Goal Priority:  High Start Date:     07/02/21                        Expected End Date:     ongoing                  Follow Up Date 08/02/21    - schedule appointment for flu shot - schedule appointment for vaccines needed due to my age or health - schedule recommended health tests (blood work, mammogram, colonoscopy, pap test) - schedule and keep appointment for annual check-up    Why is this important?   Screening tests can find diseases early when they are easier to treat.  Your doctor or nurse will talk with you about which tests are important for you.  Getting shots for common diseases like the flu and shingles will help prevent them.      Follow Up:  Patient agrees to Care Plan and Follow-up.  Plan: The Managed Medicaid care management team will reach out to the patient again over the next 30 days. and The  Patient has been provided with contact information for the Managed Medicaid care management team and has been advised to call with any health related questions or concerns.  Date/time of next scheduled RN care management/care coordination outreach:  08/02/21 at 1030

## 2021-07-04 ENCOUNTER — Other Ambulatory Visit: Payer: Self-pay | Admitting: Internal Medicine

## 2021-07-04 DIAGNOSIS — F411 Generalized anxiety disorder: Secondary | ICD-10-CM

## 2021-07-04 NOTE — Telephone Encounter (Signed)
Requested Prescriptions  ?Pending Prescriptions Disp Refills  ?? PARoxetine (PAXIL) 10 MG tablet [Pharmacy Med Name: PARoxetine HCL 10 MG TABLET]    ?  Sig: Take 1 tablet (10 mg total) by mouth daily.  ?  ? Psychiatry:  Antidepressants - SSRI Passed - 07/04/2021  6:21 AM  ?  ?  Passed - Completed PHQ-2 or PHQ-9 in the last 360 days  ?  ?  Passed - Valid encounter within last 6 months  ?  Recent Outpatient Visits   ?      ? 4 months ago Iron deficiency anemia due to chronic blood loss  ? [place] Ladell Pier, MD  ? 6 months ago Preoperative evaluation to rule out surgical contraindication  ? [place] Ladell Pier, MD  ? 7 months ago Type 2 diabetes mellitus with hyperglycemia, unspecified whether long term insulin use ([place])  ? [place], [place], [place]  ? 1 year ago Chronic midline thoracic back pain  ? [place] Ladell Pier, MD  ? 1 year ago Establishing care with new doctor, encounter for  ? [place] Ladell Pier, MD  ?  ?  ?Future Appointments   ?        ? In 1 week Leonie Man, MD [facility] [place], [facility]  ? In 2 weeks Heber Chauncey Elza Rafter, DO [facility] [place], [facility]  ? In 3 weeks Persons, Bevely Palmer, PA [place]  ?  ? ?  ?  ?  ? ?

## 2021-07-06 ENCOUNTER — Other Ambulatory Visit: Payer: Self-pay

## 2021-07-06 NOTE — Patient Instructions (Signed)
Visit Information ? ?Ms. [employer]  - as a part of your Medicaid benefit, you are eligible for care management and care coordination services at no cost or copay. I was unable to reach you by phone today but would be happy to help you with your health related needs. Please feel free to call me @ [phone]  ? ?A member of the Managed Medicaid care management team will reach out to you again over the next 7 days.  ? ?[name], BSW, MHA ?[place]  ?High Risk Managed Medicaid Team  ?[phone]  ?

## 2021-07-06 NOTE — Patient Outreach (Signed)
Care Coordination ? ?07/06/2021 ? ?[name] ?[birth year] ?[id number] ? ? ?Medicaid Managed Care  ? ?Unsuccessful Outreach Note ? ?07/06/2021 ?Name: [name] MRN: [id number] DOB: 1970-07-24 ? ?Referred by: Ladell Pier, MD ?Reason for referral : High Risk Managed Medicaid (MM Social Work [employer]) ? ? ?An unsuccessful telephone outreach was attempted today. The patient was referred to the case management team for assistance with care management and care coordination.  ? ?Follow Up Plan: The care management team will reach out to the patient again over the next 7 days.  ? ?[name], BSW, MHA ?[place]  ?High Risk Managed Medicaid Team  ?[phone]   ?

## 2021-07-11 ENCOUNTER — Encounter: Payer: Self-pay | Admitting: Cardiology

## 2021-07-11 ENCOUNTER — Ambulatory Visit (INDEPENDENT_AMBULATORY_CARE_PROVIDER_SITE_OTHER): Payer: Medicaid Other | Admitting: Cardiology

## 2021-07-11 ENCOUNTER — Other Ambulatory Visit: Payer: Self-pay

## 2021-07-11 VITALS — BP 122/70 | HR 81 | Ht 62.0 in | Wt 351.8 lb

## 2021-07-11 DIAGNOSIS — I82562 Chronic embolism and thrombosis of left calf muscular vein: Secondary | ICD-10-CM

## 2021-07-11 DIAGNOSIS — I890 Lymphedema, not elsewhere classified: Secondary | ICD-10-CM

## 2021-07-11 DIAGNOSIS — R600 Localized edema: Secondary | ICD-10-CM

## 2021-07-11 DIAGNOSIS — I5032 Chronic diastolic (congestive) heart failure: Secondary | ICD-10-CM | POA: Diagnosis not present

## 2021-07-11 DIAGNOSIS — Z6841 Body Mass Index (BMI) 40.0 and over, adult: Secondary | ICD-10-CM | POA: Diagnosis not present

## 2021-07-11 DIAGNOSIS — L97221 Non-pressure chronic ulcer of left calf limited to breakdown of skin: Secondary | ICD-10-CM | POA: Diagnosis not present

## 2021-07-11 DIAGNOSIS — I2510 Atherosclerotic heart disease of native coronary artery without angina pectoris: Secondary | ICD-10-CM | POA: Diagnosis not present

## 2021-07-11 DIAGNOSIS — I872 Venous insufficiency (chronic) (peripheral): Secondary | ICD-10-CM | POA: Diagnosis not present

## 2021-07-11 DIAGNOSIS — I5033 Acute on chronic diastolic (congestive) heart failure: Secondary | ICD-10-CM

## 2021-07-11 DIAGNOSIS — G4733 Obstructive sleep apnea (adult) (pediatric): Secondary | ICD-10-CM | POA: Diagnosis not present

## 2021-07-11 HISTORY — DX: Obstructive sleep apnea (adult) (pediatric): G47.33

## 2021-07-11 NOTE — Patient Instructions (Addendum)
Medication Instructions:  ?Take extra lasix for the next two days.  ?Take two tablets in the morning and two tablets in the evening.  ? ?Then for the next 10 days take an extra every other day. Take an extra potassium every time you take an extra lasix ? ?*If you need a refill on your cardiac medications before your next appointment, please call your pharmacy* ? ? ?Lab Work: ?BMET, CALCIUM, MAG (in 2 weeks, no lab appointment needed) ? ?If you have labs (blood work) drawn today and your tests are completely normal, you will receive your results only by: ?MyChart Message (if you have MyChart) OR ?A paper copy in the mail ?If you have any lab test that is abnormal or we need to change your treatment, we will call you to review the results. ? ? ?Follow-Up: ?At [facility], you and your health needs are our priority.  As part of our continuing mission to provide you with exceptional heart care, we have created designated Provider Care Teams.  These Care Teams include your primary Cardiologist (physician) and Advanced Practice Providers (APPs -  Physician Assistants and Nurse Practitioners) who all work together to provide you with the care you need, when you need it. ? ?We recommend signing up for the patient portal called "MyChart".  Sign up information is provided on this After Visit Summary.  MyChart is used to connect with patients for Virtual Visits (Telemedicine).  Patients are able to view lab/test results, encounter notes, upcoming appointments, etc.  Non-urgent messages can be sent to your provider as well.   ?To learn more about what you can do with MyChart, go to [email or web address].   ? ?Your next appointment:   ?2-3 month(s) ? ?The format for your next appointment:   ?In Person ? ?Provider:   ?Coletta Memos, FNP, Fabian Sharp, PA-C, Sande Rives, PA-C, Caron Presume, PA-C, Almyra Deforest, PA-C, or Diona Browner, NP . ?}  ? ?

## 2021-07-11 NOTE — Progress Notes (Signed)
? ? ?Primary Care Provider: Ladell Pier, MD ?Cardiologist: Glenetta Hew, MD ?Electrophysiologist: None ?Pulmonologist: Dr. Halford Chessman (for OSA) ? ?Clinic Note: ?Chief Complaint  ?Patient presents with  ? Hospitalization Follow-up  ?  Hospitalized in January for edema and "CHF "  ? Follow-up  ?  68-month follow-up  ? Edema  ?  Left leg worse again.  ? ? ?=================================== ? ?ASSESSMENT/PLAN  ? ?Problem List Items Addressed This Visit   ? ?  ? Cardiology Problems  ? (HFpEF) heart failure with preserved ejection fraction (HCC) -> although echo suggests normal diastolic parameters with normal left atrial size (Chronic)  ?  1 echo berry grade I diastolic dysfunction pulm on the most recent one showed normal parameters and normal left atrial size.  This would argue that her chronic edema is not from  left-sided HFpEF. ?She has some orthopnea but no real PND, and has been diagnosed with moderate sleep apnea. ? ? ?Recommend follow-up with Dr.Sood to discuss CPAP. ?She is already on high-dose carvedilol and valsartan and fracture reduction along with spironolactone, Farxiga and Lasix that has her blood pressure well controlled. ?  ?  ? Relevant Orders  ? Basic metabolic panel  ? Magnesium  ? Calcium  ? 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  Postphlebitic venous changes.  No recurrent DVT on recent evaluation.  This sets her up for having significant venous stasis.  Chronic ulcer. ? ?Dopplers did not show a lot of venous insufficiency, but there clearly is evidence of either lymphedema or small vessel venous insufficiency. ? ?Echocardiogram chronic elevated right-sided pressures. ? ?Plan:  ?Continue Lasix twice daily and increase to double dose twice daily for 2 days and then needs additional dose once or twice daily until she loses at least 6 pounds. ?Continue spironolactone.  Also take extra potassium supplement when extra dose of Lasix. ?For  follow-up labs with chemistry and magnesium levels because of cramping ?Remains on Eliquis lifelong. ?  ?  ? Venous insufficiency (Chronic)  ? CAD (coronary artery disease) (Chronic)  ?  By report, nonobstructive CAD.  She does not have chest pain or pressure with exertion.  Nothing to suspect angina. ? ?She is on ARB, beta-blocker and calcium channel blocker along with [place].  Not on aspirin or Plavix Eliquis. ? ? ?  ?  ?  ? Other  ? Lower limb ulcer, calf, left, limited to breakdown of skin ([place])  ?  Now being seen by wound care. ?  ?  ? Bilateral lower extremity edema - Primary  ?  This is a chronic issue with intermittent exacerbation.  Right now the left leg is really bothering her more.  She has pain associated with the swelling and the 1 ulcer. ? ?Presumably venous stasis and lymphedema related. ? ?Continue recommend support stockings/compression wraps when not using in a boot. ? ?Continue furosemide 80 mg twice daily but increase to 2 tabs twice daily for 2 days. ?Also on spironolactone and [place]. ?  ?  ? Lymphedema (Chronic)  ?  Continue to use lymphedema pump. ? ?Conservative management recommended by vascular surgery. ?She does much better with the [employer].  She probably needs some type of wrap use which is not on Unna boot. ? ?Continue foot elevation and diuretics ?  ?  ? Morbid obesity with BMI of 50.0-59.9, adult (HCC) (Chronic)  ?  Morbidly obese. ?With hemoglobin A1c at 6.5, could strongly consider  GLP-1 agonist either in the form of Ozempic or even Wegovy.  This would help with weight loss but also with glycemic control. ?  ?  ? OSA (obstructive sleep apnea) (Chronic)  ?  Refer back to pulmonary medicine - Dr. Halford Chessman -- need to set up for CPAP titration. --Has significant daytime somnolence and fatigue. ?  ?  ? ? ?=================================== ? ?HPI:   ? ?[name] is a morbidly obese 51 y.o. 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 ? ?"CHF was diagnosed because of an episode of dyspnea during PRBC infusion in August 2021. ? ?[name] was last seen on June 30, 2020 -> was recovering from left lower extremity wound (venous stasis ulcer).  Edema improving, but still hard to wear the compression stockings because of the wound.  Was cutting out sodas and sweets to try to lose some weight.  No PND, orthopnea.  No chest pain or pressure with rest or exertion.  Deconditioned-therefore exertional dyspnea. ?=> noted that whenever the Un[place]oot dressing is removed, the edema "just comes back". ?Recommended increasing valsartan to 160 mg daily - 5-6 month f/u ? ?Most recent wound care follow-up November 2022 -> noted that the left leg wound began in July 2022 trying to keep leg elevated] for seconds.  Was apparently using lymphedema pumps. => There are wraps Wet because of shower. ? ?Recent Hospitalizations:  ?Hospitalized 1/27 to 06/01/2021: She presented to the ER with dyspnea and lower extremity pain and swelling presumably heart failure exacerbation.  There is pulmonary venous congestion and bilateral lower extremity edema.  She was treated with IV Lasix with good response with-10 L net diuresis.  Recently, echocardiogram showed normal EF and no diastolic dysfunction.  Home dose of Lasix 80 mg twice daily started.  Was told to restart CPAP.    ?She had her sleep evaluation done but has not followed back up again with Dr. Halford Chessman ? ?Reviewed  CV studies:   ? ?The following studies were reviewed today: (if available, images/films reviewed: From Epic Chart or Care Everywhere) ?Sleep study April 06, 2019 (Dr. Halford Chessman): Moderate OSA (AHI of 24.4 and SPO2 low of 77%).  Majority  events during REM sleep.-recommend CPAP, oral appliance or surgical. Reviewed sleep hygiene.  Avoid sedatives.   ?Echo 05/26/2021:  EF 60 to 65%.  No RWMA.[name]  "Normal "diastolic parameters".  Normal left atrial size normal RVP and RAP.  Normal valves. ?Lower extremity venous Dopplers 05/27/2021: No DVT bilaterally..  Overall limited study. ? ?Interval History:  ? ?[name] returns for 110-month follow-up which is actually a hospital follow-up.  She says that the left leg wound just opened up again and the swelling got worse.  She went to the emergency room in the end of January and was treated with IV diuresis for a while.  She said initially after discharge things did feel better, but are now back swollen again. ? ?She still is has baseline 2 pillow orthopnea with occasional PND symptoms.  But mostly cystic she is tired all the time.  She does not yet have CPAP set up -> has never had follow-up with sleep medicine. ? ?She still does the best she can with the support stockings and wraps.  Still has wound  care.  She says she is trying to eat better to avoid excess swelling.  The leg just hurts a lot and she was asking about having a standing pain medication for it. ? ?Interestingly, when she went to the hospital she really did not have that much in the way of any orthopnea or PND to suggest that this was a CHF exacerbation. ? ?CV Review of Symptoms (Summary) ?Cardiovascular ROS: positive for - dyspnea on exertion, edema, orthopnea, shortness of breath, and most of this is chronic.  Left leg worse than right swelling. ?negative for - chest pain, irregular heartbeat, palpitations, paroxysmal nocturnal dyspnea, rapid heart rate, shortness of breath, or lightheadedness/dizziness or wooziness, syncope/near syncope or TIA/amaurosis fugax claudication ? ? ?REVIEWED OF SYSTEMS  ? ?Review of Systems  ?Constitutional:  Positive for malaise/fatigue (Partially affected by anemia, but also does not get a good sleep.)  and weight loss (Weight is actually up 6 pounds since January).  ?HENT:  Negative for nosebleeds.   ?Respiratory:  Positive for shortness of breath (With exertion, very deconditioned) and wheezing (When her a

## 2021-07-14 ENCOUNTER — Encounter: Payer: Self-pay | Admitting: Cardiology

## 2021-07-14 NOTE — Assessment & Plan Note (Signed)
Continue to use lymphedema pump. ? ?Conservative management recommended by vascular surgery. ?She does much better with the [employer].  She probably needs some type of wrap use which is not on Unna boot. ? ?Continue foot elevation and diuretics ?

## 2021-07-14 NOTE — Assessment & Plan Note (Signed)
Morbidly obese. ?With hemoglobin A1c at 6.5, could strongly consider GLP-1 agonist either in the form of Ozempic or even Wegovy.  This would help with weight loss but also with glycemic control. ?

## 2021-07-14 NOTE — Assessment & Plan Note (Signed)
This is a chronic issue with intermittent exacerbation.  Right now the left leg is really bothering her more.  She has pain associated with the swelling and the 1 ulcer. ? ?Presumably venous stasis and lymphedema related. ? ?Continue recommend support stockings/compression wraps when not using in a boot. ? ?Continue furosemide 80 mg twice daily but increase to 2 tabs twice daily for 2 days. ?Also on spironolactone and [place]. ?

## 2021-07-14 NOTE — Assessment & Plan Note (Signed)
By report, nonobstructive CAD.  She does not have chest pain or pressure with exertion.  Nothing to suspect angina. ? ?She is on ARB, beta-blocker and calcium channel blocker along with [place].  Not on aspirin or Plavix Eliquis. ? ? ?

## 2021-07-14 NOTE — Assessment & Plan Note (Addendum)
1 echo berry grade I diastolic dysfunction pulm on the most recent one showed normal parameters and normal left atrial size.  This would argue that her chronic edema is not from  left-sided HFpEF. ?She has some orthopnea but no real PND, and has been diagnosed with moderate sleep apnea. ? ? ?Recommend follow-up with Dr.Sood to discuss CPAP. ?She is already on high-dose carvedilol and valsartan and fracture reduction along with spironolactone, Farxiga and Lasix that has her blood pressure well controlled. ?

## 2021-07-14 NOTE — Assessment & Plan Note (Signed)
Now being seen by wound care. ?

## 2021-07-14 NOTE — Assessment & Plan Note (Addendum)
Postphlebitic venous changes.  No recurrent DVT on recent evaluation.  This sets her up for having significant venous stasis.  Chronic ulcer. ? ?Dopplers did not show a lot of venous insufficiency, but there clearly is evidence of either lymphedema or small vessel venous insufficiency. ? ?Echocardiogram chronic elevated right-sided pressures. ? ?Plan:  ?? Continue Lasix twice daily and increase to double dose twice daily for 2 days and then needs additional dose once or twice daily until she loses at least 6 pounds. ?? Continue spironolactone.  Also take extra potassium supplement when extra dose of Lasix. ?? For follow-up labs with chemistry and magnesium levels because of cramping ?? Remains on Eliquis lifelong. ?

## 2021-07-14 NOTE — Assessment & Plan Note (Signed)
Refer back to pulmonary medicine - Dr. Halford Chessman -- need to set up for CPAP titration. --Has significant daytime somnolence and fatigue. ?

## 2021-07-16 ENCOUNTER — Ambulatory Visit: Payer: Medicaid Other | Admitting: Physical Therapy
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
## 2021-07-20 ENCOUNTER — Other Ambulatory Visit: Payer: Self-pay

## 2021-07-20 ENCOUNTER — Ambulatory Visit: Payer: Medicaid Other | Attending: Physician Assistant | Admitting: Physical Therapy

## 2021-07-20 DIAGNOSIS — M6281 Muscle weakness (generalized): Secondary | ICD-10-CM | POA: Insufficient documentation

## 2021-07-20 DIAGNOSIS — R2689 Other abnormalities of gait and mobility: Secondary | ICD-10-CM | POA: Insufficient documentation

## 2021-07-20 DIAGNOSIS — M545 Low back pain, unspecified: Secondary | ICD-10-CM | POA: Insufficient documentation

## 2021-07-20 DIAGNOSIS — G8929 Other chronic pain: Secondary | ICD-10-CM | POA: Insufficient documentation

## 2021-07-20 DIAGNOSIS — M5440 Lumbago with sciatica, unspecified side: Secondary | ICD-10-CM | POA: Insufficient documentation

## 2021-07-20 DIAGNOSIS — M546 Pain in thoracic spine: Secondary | ICD-10-CM | POA: Diagnosis present

## 2021-07-23 ENCOUNTER — Encounter (HOSPITAL_BASED_OUTPATIENT_CLINIC_OR_DEPARTMENT_OTHER): Payer: Medicaid Other | Attending: Internal Medicine | Admitting: Internal Medicine

## 2021-07-23 ENCOUNTER — Other Ambulatory Visit: Payer: Self-pay

## 2021-07-23 DIAGNOSIS — I890 Lymphedema, not elsewhere classified: Secondary | ICD-10-CM | POA: Insufficient documentation

## 2021-07-23 DIAGNOSIS — I87312 Chronic venous hypertension (idiopathic) with ulcer of left lower extremity: Secondary | ICD-10-CM | POA: Insufficient documentation

## 2021-07-23 DIAGNOSIS — I5032 Chronic diastolic (congestive) heart failure: Secondary | ICD-10-CM | POA: Insufficient documentation

## 2021-07-23 DIAGNOSIS — I872 Venous insufficiency (chronic) (peripheral): Secondary | ICD-10-CM | POA: Diagnosis not present

## 2021-07-23 DIAGNOSIS — Z86718 Personal history of other venous thrombosis and embolism: Secondary | ICD-10-CM | POA: Insufficient documentation

## 2021-07-23 DIAGNOSIS — L97822 Non-pressure chronic ulcer of other part of left lower leg with fat layer exposed: Secondary | ICD-10-CM | POA: Diagnosis not present

## 2021-07-23 DIAGNOSIS — Z87891 Personal history of nicotine dependence: Secondary | ICD-10-CM | POA: Insufficient documentation

## 2021-07-23 DIAGNOSIS — I110 Hypertensive heart disease with heart failure: Secondary | ICD-10-CM | POA: Diagnosis not present

## 2021-07-23 NOTE — Progress Notes (Signed)
CLEVELAND, PAIZ D. ([id number]) ?Visit Report for 07/23/2021 ?Allergy List Details ?Patient Name: Date of Service: ?KENZEE, BASSIN D. 07/23/2021 1:15 PM ?Medical Record Number: [id number] ?Patient Account Number: [id number] ?Date of Birth/Sex: Treating RN: ?[birth year] (51 y.o. [name] ?Primary Care Olson Lucarelli: [name] Other Clinician: ?Referring Fidel Caggiano: ?Treating Tabby Beaston/Extender: [name] ?[name] ?Weeks in Treatment: 0 ?Allergies ?Active Allergies ?ACE Inhibitors ?hydromorphone ?vancomycin ?ferrous sulfate ?Allergy Notes ?Electronic Signature(s) ?Signed: 07/23/2021 4:27:43 PM By: [name] ?Entered By: [name] on 07/23/2021 13:16:49 ?-------------------------------------------------------------------------------- ?Arrival Information Details ?Patient Name: Date of Service: ?KYONNA, FRIER D. 07/23/2021 1:15 PM ?Medical Record Number: [id number] ?Patient Account Number: [id number] ?Date of Birth/Sex: Treating RN: ?[birth year] (51 y.o. [name] ?Primary Care Dalaina Tates: [name] Other Clinician: ?Referring Daralyn Bert: ?Treating Kasidee Voisin/Extender: [name] ?[name] ?Weeks in Treatment: 0 ?Visit Information ?Patient Arrived: Ambulatory ?Arrival Time: 12:50 ?Transfer Assistance: None ?Patient Identification Verified: Yes ?Secondary Verification Process Completed: Yes ?Patient Requires Transmission-Based Precautions: No ?Patient Has Alerts: Yes ?Patient Alerts: Patient on Blood Thinner ?L ABI=[place] ?History Since Last Visit ?Added or deleted any medications: No ?Any new allergies or adverse reactions: No ?Had a fall or experienced change in activities of daily living that may affect risk of falls: No ?Signs or symptoms of abuse/neglect since last visito No ?Hospitalized since last visit: No ?Implantable device outside of the clinic excluding cellular tissue based products placed in the center since last visit: No ?Has Dressing in Place as Prescribed:  Yes ?Electronic Signature(s) ?Signed: 07/23/2021 4:27:43 PM By: [name] ?Entered By: [name] on 07/23/2021 13:17:08 ?-------------------------------------------------------------------------------- ?Clinic Level of Care Assessment Details ?Patient Name: Date of Service: ?ARYIA, DELIRA D. 07/23/2021 1:15 PM ?Medical Record Number: [id number] ?Patient Account Number: [id number] ?Date of Birth/Sex: Treating RN: ?November 18, 1970 (51 y.o. [name] ?Primary Care Mariea Mcmartin: [name] Other Clinician: ?Referring Loany Neuroth: ?Treating Zina Pitzer/Extender: [name] ?[name] ?Weeks in Treatment: 0 ?Clinic Level of Care Assessment Items ?TOOL 2 Quantity Score ?X- 1 0 ?Use when only an EandM is performed on the INITIAL visit ?ASSESSMENTS - Nursing Assessment / Reassessment ?X- 1 20 ?General Physical Exam (combine w/ comprehensive assessment (listed just below) when performed on new pt. evals) ?X- 1 25 ?Comprehensive Assessment (HX, ROS, Risk Assessments, Wounds Hx, etc.) ?ASSESSMENTS - Wound and Skin A ssessment / Reassessment ?[]  - 0 ?Simple Wound Assessment / Reassessment - one wound ?X- 2 5 ?Complex Wound Assessment / Reassessment - multiple wounds ?[]  - 0 ?Dermatologic / Skin Assessment (not related to wound area) ?ASSESSMENTS - Ostomy and/or Continence Assessment and Care ?[]  - 0 ?Incontinence Assessment and Management ?[]  - 0 ?Ostomy Care Assessment and Management (repouching, etc.) ?PROCESS - Coordination of Care ?[]  - 0 ?Simple Patient / Family Education for ongoing care ?X- 1 20 ?Complex (extensive) Patient / Family Education for ongoing care ?X- 1 10 ?Staff obtains Consents, Records, T Results / Process Orders ?est ?X- 1 10 ?Staff telephones HHA, Nursing Homes / Clarify orders / etc ?[]  - 0 ?Routine Transfer to another Facility (non-emergent condition) ?[]  - 0 ?Routine Hospital Admission (non-emergent condition) ?[]  - 0 ?New Admissions / Biomedical engineer / Ordering NPWT  Apligraf, etc. ?, ?[]  - 0 ?Emergency Hospital Admission (emergent condition) ?[]  - 0 ?Simple Discharge Coordination ?[]  - 0 ?Complex (extensive) Discharge Coordination ?PROCESS - Special Needs ?[]  - 0 ?Pediatric / Minor Patient Management ?[]  - 0 ?Isolation Patient Management ?[]  - 0 ?Hearing / Language / Visual special needs ?[]  - 0 ?Assessment  of Community assistance (transportation, D/C planning, etc.) ?[]  - 0 ?Additional assistance / Altered mentation ?[]  - 0 ?Support Surface(s) Assessment (bed, cushion, seat, etc.) ?INTERVENTIONS - Wound Cleansing / Measurement ?X- 1 5 ?Wound Imaging (photographs - any number of wounds) ?[]  - 0 ?Wound Tracing (instead of photographs) ?[]  - 0 ?Simple Wound Measurement - one wound ?X- 2 5 ?Complex Wound Measurement - multiple wounds ?[]  - 0 ?Simple Wound Cleansing - one wound ?X- 2 5 ?Complex Wound Cleansing - multiple wounds ?INTERVENTIONS - Wound Dressings ?[]  - 0 ?Small Wound Dressing one or multiple wounds ?[]  - 0 ?Medium Wound Dressing one or multiple wounds ?X- 1 20 ?Large Wound Dressing one or multiple wounds ?[]  - 0 ?Application of Medications - injection ?INTERVENTIONS - Miscellaneous ?[]  - 0 ?External ear exam ?[]  - 0 ?Specimen Collection (cultures, biopsies, blood, body fluids, etc.) ?[]  - 0 ?Specimen(s) / Culture(s) sent or taken to Lab for analysis ?[]  - 0 ?Patient Transfer (multiple staff / Civil Service fast streamer / Similar devices) ?[]  - 0 ?Simple Staple / Suture removal (25 or less) ?[]  - 0 ?Complex Staple / Suture removal (26 or more) ?[]  - 0 ?Hypo / Hyperglycemic Management (close monitor of Blood Glucose) ?X- 1 15 ?Ankle / Brachial Index (ABI) - do not check if billed separately ?Has the patient been seen at the hospital within the last three years: Yes ?Total Score: 155 ?Level Of Care: New/Established - Level 4 ?Electronic Signature(s) ?Signed: 07/23/2021 4:27:43 PM By: [name] ?Entered By: [name] on 07/23/2021  13:26:53 ?-------------------------------------------------------------------------------- ?Compression Therapy Details ?Patient Name: Date of Service: ?DURINDA, BUZZELLI D. 07/23/2021 1:15 PM ?Medical Record Number: [id number] ?Patient Account Number: [id number] ?Date of Birth/Sex: Treating RN: ?[birth year] (51 y.o. [name] ?Primary Care Affan Callow: [name] Other Clinician: ?Referring Rolly Magri: ?Treating Tesia Lybrand/Extender: [name] ?[name] ?Weeks in Treatment: 0 ?Compression Therapy Performed for Wound Assessment: Wound #7 Left,Proximal,Anterior Lower Leg ?Performed By: Clinician [name], RN ?Compression Type: Three Layer ?Post Procedure Diagnosis ?Same as Pre-procedure ?Electronic Signature(s) ?Signed: 07/23/2021 4:27:43 PM By: [name] ?Entered By: [name] on 07/23/2021 13:23:23 ?-------------------------------------------------------------------------------- ?Compression Therapy Details ?Patient Name: Date of Service: ?SIRENIA, WHITIS D. 07/23/2021 1:15 PM ?Medical Record Number: [id number] ?Patient Account Number: [id number] ?Date of Birth/Sex: ?Treating RN: ?12/20/1970 (51 y.o. [name] ?Primary Care Pascale Maves: ?Other Clinician: ?[name] ?Referring Geniyah Eischeid: ?Treating Ashly Yepez/Extender: [name] ?[name] ?Weeks in Treatment: 0 ?Compression Therapy Performed for Wound Assessment: Wound #8 Left,Distal,Anterior Lower Leg ?Performed By: Clinician [name], RN ?Compression Type: Three Layer ?Post Procedure Diagnosis ?Same as Pre-procedure ?Electronic Signature(s) ?Signed: 07/23/2021 4:27:43 PM By: [name] ?Entered By: [name] on 07/23/2021 13:23:23 ?-------------------------------------------------------------------------------- ?Encounter Discharge Information Details ?Patient Name: ?Date of Service: ?DESSIE, TATEM D. 07/23/2021 1:15 PM ?Medical Record Number: [id number] ?Patient Account Number: [id number] ?Date  of Birth/Sex: ?Treating RN: ?Feb 21, 1971 (51 y.o. [name] ?Primary Care Alanta Scobey: [name] ?Other Clinician: ?Referring Jamar Casagrande: ?Treating Doralene Glanz/Extender: [name] ?[name] ?Weeks in Treatment: 0 ?Encounter Discharge

## 2021-07-23 NOTE — Progress Notes (Signed)
[name], [name] ([id number]) ?Visit Report for 07/23/2021 ?Abuse Risk Screen Details ?Patient Name: Date of Service: ?[name], [name] 07/23/2021 1:15 PM ?Medical Record Number: [id number] ?Patient Account Number: [id number] ?Date of Birth/Sex: Treating RN: ?[birth year] (51 y.o. [name] ?Primary Care Airika Alkhatib: [name] Other Clinician: ?Referring Teneisha Gignac: ?Treating Nashea Chumney/Extender: [name] ?[name] ?Weeks in Treatment: 0 ?Abuse Risk Screen Items ?Answer ?ABUSE RISK SCREEN: ?Has anyone close to you tried to hurt or harm you recentlyo No ?Do you feel uncomfortable with anyone in your familyo No ?Has anyone forced you do things that you didnt want to doo No ?Electronic Signature(s) ?Signed: 07/23/2021 4:27:43 PM By: [name] ?Entered By: [name] on 07/23/2021 12:56:17 ?-------------------------------------------------------------------------------- ?Activities of Daily Living Details ?Patient Name: Date of Service: ?[name], [name] 07/23/2021 1:15 PM ?Medical Record Number: [id number] ?Patient Account Number: [id number] ?Date of Birth/Sex: Treating RN: ?[birth year] (51 y.o. [name] ?Primary Care Jarron Curley: [name] Other Clinician: ?Referring Cedar Roseman: ?Treating Kristine Chahal/Extender: [name] ?[name] ?Weeks in Treatment: 0 ?Activities of Daily Living Items ?Answer ?Activities of Daily Living (Please select one for each item) ?[place] ?T Medications ?ake Completely Able ?Use T elephone Completely Able ?Care for Appearance Completely Able ?Use T oilet Completely Able ?Bath / Shower Completely Able ?Dress Self Completely Able ?Feed Self Completely Able ?Walk Completely Able ?Get In / Out Bed Completely Able ?Housework Completely Able ?Prepare Meals Completely Able ?Handle Money Completely Able ?Shop for Self Completely Able ?Electronic Signature(s) ?Signed: 07/23/2021 4:27:43 PM By: [name] ?Entered By:  [name] on 07/23/2021 12:56:37 ?-------------------------------------------------------------------------------- ?Education Screening Details ?Patient Name: ?Date of Service: ?[name], [name] 07/23/2021 1:15 PM ?Medical Record Number: [id number] ?Patient Account Number: [id number] ?Date of Birth/Sex: ?Treating RN: ?08-Jun-1970 (51 y.o. [name] ?Primary Care Keishawn Rajewski: [name] ?Other Clinician: ?Referring Margot Oriordan: ?Treating Karin Pinedo/Extender: [name] ?[name] ?Weeks in Treatment: 0 ?Primary Learner Assessed: Patient ?Learning Preferences/Education Level/Primary Language ?Learning Preference: Explanation, Demonstration, Printed Material ?Highest Education Level: High School ?Preferred Language: English ?Cognitive Barrier ?Language Barrier: No ?Translator Needed: No ?Memory Deficit: No ?Emotional Barrier: No ?Cultural/Religious Beliefs Affecting Medical Care: No ?Physical Barrier ?Impaired Vision: No ?Impaired Hearing: No ?Decreased Hand dexterity: No ?Knowledge/Comprehension ?Knowledge Level: High ?Comprehension Level: High ?Ability to understand written instructions: High ?Ability to understand verbal instructions: High ?Motivation ?Anxiety Level: Calm ?Cooperation: Cooperative ?Education Importance: Acknowledges Need ?Interest in Health Problems: Asks Questions ?Perception: Coherent ?Willingness to Engage in Self-Management High ?Activities: ?Readiness to Engage in Self-Management High ?Activities: ?Electronic Signature(s) ?Signed: 07/23/2021 4:27:43 PM By: [name] ?Entered By: [name] on 07/23/2021 12:57:06 ?-------------------------------------------------------------------------------- ?Fall Risk Assessment Details ?Patient Name: ?Date of Service: ?[name], [name] 07/23/2021 1:15 PM ?Medical Record Number: [id number] ?Patient Account Number: [id number] ?Date of Birth/Sex: ?Treating RN: ?February 04, 1971 (51 y.o. [name] ?Primary Care Loxley Cibrian: [name] ?Other Clinician: ?Referring Daziah Hesler: ?Treating Markees Carns/Extender: [name] ?[name] ?Weeks in Treatment: 0 ?Fall Risk Assessment Items ?Have you had 2 or more falls in the last 12 monthso 0 No ?Have you had any fall that resulted in injury in the last 12 monthso 0 No ?FALLS RISK SCREEN ?History of falling - immediate or within 3 months 0 No ?Secondary diagnosis (Do you have 2 or more medical diagnoseso) 15 Yes ?Ambulatory aid ?None/bed rest/wheelchair/nurse 0 Yes ?Crutches/cane/walker 0 No ?Furniture 0 No ?Intravenous therapy Access/Saline/Heparin Lock 0 No ?Gait/Transferring ?Normal/ bed rest/ wheelchair 0 Yes ?Weak (short steps with or without  shuffle, stooped but able to lift head while walking, may seek 0 No ?support from furniture) ?Impaired (short steps with shuffle, may have difficulty arising from chair, head down, impaired 0 No ?balance) ?Mental Status ?Oriented to own ability 0 Yes ?Electronic Signature(s) ?Signed: 07/23/2021 4:27:43 PM By: [name] ?Entered By: [name] on 07/23/2021 12:57:19 ?-------------------------------------------------------------------------------- ?Foot Assessment Details ?Patient Name: ?Date of Service: ?[name], [name] 07/23/2021 1:15 PM ?Medical Record Number: [id number] ?Patient Account Number: [id number] ?Date of Birth/Sex: ?Treating RN: ?Nov 13, 1970 (51 y.o. [name] ?Primary Care Amanat Hackel: [name] ?Other Clinician: ?Referring Calin Fantroy: ?Treating Osiel Stick/Extender: [name] ?[name] ?Weeks in Treatment: 0 ?Foot Assessment Items ?Site Locations ?+ = Sensation present, - = Sensation absent, C = Callus, U = Ulcer ?R = Redness, W = Warmth, M = Maceration, PU = Pre-ulcerative lesion ?F = Fissure, S = Swelling, D = Dryness ?Assessment ?Right: Left: ?Other Deformity: No No ?Prior Foot Ulcer: No No ?Prior Amputation: No No ?Charcot Joint: No No ?Ambulatory Status: Ambulatory Without Help ?Gait:  Steady ?Electronic Signature(s) ?Signed: 07/23/2021 4:27:43 PM By: [name] ?Entered By: [name] on 07/23/2021 12:58:00 ?-------------------------------------------------------------------------------- ?Nutrition Risk Screening Details ?Patient Name: ?Date of Service: ?[name], [name] 07/23/2021 1:15 PM ?Medical Record Number: [id number] ?Patient Account Number: [id number] ?Date of Birth/Sex: ?Treating RN: ?[birth year] (51 y.o. [name] ?Primary Care Siarah Deleo: [name] ?Other Clinician: ?Referring Jahnai Slingerland: ?Treating Miranda Frese/Extender: [name] ?[name] ?Weeks in Treatment: 0 ?Height (in): ?Weight (lbs): ?Body Mass Index (BMI): ?Nutrition Risk Screening Items ?Score Screening ?NUTRITION RISK SCREEN: ?I have an illness or condition that made me change the kind and/or amount of food I eat 0 No ?I eat fewer than two meals per day 0 No ?I eat few fruits and vegetables, or milk products 0 No ?I have three or more drinks of beer, liquor or wine almost every day 0 No ?I have tooth or mouth problems that make it hard for me to eat 0 No ?I don't always have enough money to buy the food I need 0 No ?I eat alone most of the time 0 No ?I take three or more different prescribed or over-the-counter drugs a day 1 Yes ?Without wanting to, I have lost or gained 10 pounds in the last six months 0 No ?I am not always physically able to shop, cook and/or feed myself 0 No ?Nutrition Protocols ?Good Risk Protocol 0 No interventions needed ?Moderate Risk Protocol ?High Risk Proctocol ?Risk Level: Good Risk ?Score: 1 ?Electronic Signature(s) ?Signed: 07/23/2021 4:27:43 PM By: [name] ?Entered By: [name] on 07/23/2021 12:57:29 ?

## 2021-07-24 ENCOUNTER — Telehealth: Payer: Self-pay | Admitting: Internal Medicine

## 2021-07-24 NOTE — Telephone Encounter (Signed)
Copied from [place] [id number]. Topic: General - Other ?>> Jul 24, 2021  9:28 AM Tessa Lerner A wrote: ?Reason for CRM: Denise with Dr. Elza Rafter office has received the patient's medical clearance form and has an additional question about the patient's allergies  ? ?There are additional questions about patient's lidocaine allergy  ? ?Please contact further when possible ?

## 2021-07-24 NOTE — Telephone Encounter (Signed)
Will forward to provider  

## 2021-07-24 NOTE — Progress Notes (Signed)
[name], [name] ([id number]) ?Visit Report for 07/23/2021 ?Chief Complaint Document Details ?Patient Name: Date of Service: ?[name], [name] 07/23/2021 1:15 PM ?Medical Record Number: [id number] ?Patient Account Number: [id number] ?Date of Birth/Sex: Treating RN: ?04-Aug-1970 (51 y.o. F) ?Primary Care Provider: [name] Other Clinician: ?Referring Provider: ?Treating Provider/Extender: [name] ?[name] ?Weeks in Treatment: 0 ?Information Obtained from: Patient ?Chief Complaint ?07/23/2021; Left lower extremity wound ?Electronic Signature(s) ?Signed: 07/23/2021 2:38:30 PM By: [name] DO ?Entered By: [name] on 07/23/2021 14:31:12 ?-------------------------------------------------------------------------------- ?HPI Details ?Patient Name: Date of Service: ?[name], [name] 07/23/2021 1:15 PM ?Medical Record Number: [id number] ?Patient Account Number: [id number] ?Date of Birth/Sex: Treating RN: ?[birth year] (51 y.o. F) ?Primary Care Provider: [name] Other Clinician: ?Referring Provider: ?Treating Provider/Extender: [name] ?[name] ?Weeks in Treatment: 0 ?History of Present Illness ?HPI Description: Admission 6/24 ?[name] is a 51 year old female with a past medical history of chronic venous insufficiency, lymphedema, DVT on anticoagulation and diastolic ?heart failure that presents to the clinic for left lower extremity wound. She was last seen 4 months ago in our clinic for the same issue. The reoccurring wound ?started at the end of May and has been going on for 1 month. She has been using an ointment and I am unclear what this is. She tries to keep her leg elevated ?with her compression stocking. She also reports she has lymphedema pumps and has been using them as well. ?She reports mild pain to the area. She denies signs of infection including increased warmth, erythema or purulent drainage. ?7/1; patient presents for 1 week  follow-up. She has tolerated the wrap well. Unfortunately she did have trouble with the wrap Sliding down her leg 2 days ago. ?She has no issues or complaints today. She denies signs of infection. ?7/15; patient presents for follow-up. She has tolerated the wrap well. She has no issues or complaints today. She denies signs of infection. ?7/25; patient presents for 1 week follow-up. She has no issues or complaints today. She denies signs of infection. ?8/4; patient presents for follow-up. She has tolerated the compression wrap well to her left lower extremity. She states she was in the hospital last week due to ?fluid overload. She subsequently developed blisters to her bottom from being in the hospital bed for prolonged periods of time. These have since ruptured. She ?now has 2 areas of skin breakdown. She has been keeping gauze on them. She denies signs of infection. ?8/18; patient presents for 2-week follow-up. She reports improvement to her buttocks wounds. She is able to tolerate the wrap well to her left lower extremity ?with no issues. She denies signs of infection. ?9/1; patient presents for 2-week follow-up. She has no issues or complaints today. she denies signs of infection. ?9/15; patient presents for 2-week follow-up. She has no issues or complaints today. She has tolerated the wrap well. She denies signs of infection. ?9/27; patient presents for 1 week follow-up. She has no issues or complaints today. ?10/20; patient presents for follow-up. She was on vacation for the past 3 weeks. She states she had the last clinic visit wrap in place for 1 week and then she ?took this off. She states that she has been using her compression wraps since. She states that the wound has gotten larger. She reports pain to the wound ?site. She denies fever/chills, nausea/vomiting. She denies purulent drainage. ?10/24; patient presents for follow-up. She states she tolerated the Kerlix/Coban wrap well. She reports decrease  in  pain. She states she started Keflex. She ?denies systemic signs of infection. ?10/28; patient presents for follow-up. She has no issues or complaints today. She denies acute pain. She tolerated the wrap well. ?11/4; patient presents for follow-up. She has no issues or complaints. She denies signs of infection. ?11/11; patient presents for follow-up. She has no issues or complaints today. She denies signs of infection. She tolerated the wrap well. ?11/18; patient presents for follow-up. She states she took a shower however despite a plastic bag the wrap got wet. She currently denies signs of infection. ?12/2; patient presents for follow-up. She tolerated the wrap well. She has no issues or complaints today. ?12/12; patient presents for follow-up. She has no issues or complaints today. ?Readmission 07/23/2021 ?[name] is a 51 year old female with a past medical history of chronic venous insufficiency that presents to the clinic for a left lower extremity ?wound. She was seen previously in the clinic for the same wound and this was healed on 04/09/2021. Since then she has been wearing her compression ?stockings intermittently. She reports more swelling to her left lower extremity over the past month and she stopped wearing her compression stocking. ?Subsequently she developed a wound. Her Lasix has been increased and she has noticed an improvement in her swelling. She currently denies signs of ?infection. ?Electronic Signature(s) ?Signed: 07/23/2021 2:38:30 PM By: [name] DO ?Entered By: [name] on 07/23/2021 14:33:55 ?-------------------------------------------------------------------------------- ?Physical Exam Details ?Patient Name: Date of Service: ?[name], [name] 07/23/2021 1:15 PM ?Medical Record Number: [id number] ?Patient Account Number: [id number] ?Date of Birth/Sex: Treating RN: ?04-Jul-1970 (51 y.o. F) ?Primary Care Provider: [name] Other Clinician: ?Referring  Provider: ?Treating Provider/Extender: [name] ?[name] ?Weeks in Treatment: 0 ?Constitutional ?respirations regular, non-labored and within target range for patient.[name] ?Cardiovascular ?2+ dorsalis pedis/posterior tibialis pulses. ?Psychiatric ?pleasant and cooperative. ?Notes ?Left lower extremity: 2+ pitting edema to the knee. Open wound to the anterior aspect with granulation tissue throughout. No signs of surrounding infection. ?Electronic Signature(s) ?Signed: 07/23/2021 2:38:30 PM By: [name] DO ?Entered By: [name] on 07/23/2021 14:34:34 ?-------------------------------------------------------------------------------- ?Physician Orders Details ?Patient Name: Date of Service: ?[name], [name] 07/23/2021 1:15 PM ?Medical Record Number: [id number] ?Patient Account Number: [id number] ?Date of Birth/Sex: Treating RN: ?December 02, 1970 (51 y.o. [name] ?Primary Care Provider: [name] Other Clinician: ?Referring Provider: ?Treating Provider/Extender: [name] ?[name] ?Weeks in Treatment: 0 ?Verbal / Phone Orders: No ?Diagnosis Coding ?Follow-up Appointments ?ppointment in 1 week. - with Dr. Heber Sultana Leveda Anna, Room 7) ?Return A ?Bathing/ Shower/ Hygiene ?May shower with protection but do not get wound dressing(s) wet. - May use cast protector bag from [facility] or [facility] ?Edema Control - Lymphedema / SCD / Other ?Lymphedema Pumps. Use Lymphedema pumps on leg(s) 2-3 times a day for 45-60 minutes. If wearing any wraps or hose, do not remove ?them. Continue exercising as instructed. - Continue to use pumps daily ?Elevate legs to the level of the heart or above for 30 minutes daily and/or when sitting, a frequency of: - throughout the day ?Avoid standing for long periods of time. ?Wound Treatment ?Wound #7 - Lower Leg Wound Laterality: Left, Anterior, Proximal ?Cleanser: Soap and Water 1 x Per Week/30 Days ?Discharge Instructions: May shower and wash  wound with dial antibacterial soap and water prior to dressing change. ?Cleanser: Wound Cleanser 1 x Per Week/30 Days ?Discharge Instructions: Cleanse the wound with wound cleanser prior to applying a clean dressing usi

## 2021-07-25 NOTE — Telephone Encounter (Signed)
Phone call returned to Dr. Elza Rafter office.  I spoke with his office manager Paola.  They had a question about patient's allergy to lidocaine.  The dentist wanted to know whether it was a reaction to the epinephrine component such as heart racing/palpitation because the combination that they give contains lidocaine and epinephrine.  Patient will be undergoing deep cleaning and scaling.  I told her that I had prescribed lidocaine patches for the patient several months ago for back pain and she reported that the lidocaine patch caused itching.  She stated that she will pass this information on to Dr. Quincy Simmonds and will also contact pt to see if she had any reaction to lidocaine given for other dental procedures by previous dentist. ?

## 2021-07-31 ENCOUNTER — Ambulatory Visit (INDEPENDENT_AMBULATORY_CARE_PROVIDER_SITE_OTHER): Payer: Medicaid Other | Admitting: Physician Assistant

## 2021-07-31 ENCOUNTER — Encounter: Payer: Self-pay | Admitting: Physician Assistant

## 2021-07-31 DIAGNOSIS — M5440 Lumbago with sciatica, unspecified side: Secondary | ICD-10-CM | POA: Diagnosis not present

## 2021-07-31 NOTE — Progress Notes (Signed)
? ?Office Visit Note ?  ?Patient: [name]           ?Date of Birth: 10/12/1970           ?MRN: [id number] ?Visit Date: 07/31/2021 ?             ?Requested by: Ladell Pier, MD ?[address] ?[address] ?[place],  [place] [address] ?PCP: Ladell Pier, MD ? ?Chief Complaint  ?Patient presents with  ? Lower Back - Follow-up  ? ? ? ? ?HPI: ?Patient is a very pleasant 51 year old woman who comes in follow-up for her thoracic and lower back pain.  She states she does not really feel any better.  She said the Percocet she got only helps a little and other medicines do not seem to help her at all.  She was prescribed physical therapy and she has had 1 session of an initial evaluation.  She denies any progression of her symptoms any loss of bowel or bladder control pain is still from her thoracic spine down into her left posterior buttock ? ?Assessment & Plan: ?Visit Diagnoses: No diagnosis found. ? ?Plan: Patient that she had an MRI of her spine through [facility] I could not find this.  I do think she needs to try completing physical therapy.  We also discussed contribution of her weight to her problems.  She understands this and she thinks she has lost weight and is actively trying to do so.  We will have her follow-up after she finishes physical therapy.  We could consider an MRI if either her thoracic or lumbar spine and possible ESI injection ? ?Follow-Up Instructions: No follow-ups on file.  ? ?Ortho Exam ? ?Patient is alert, oriented, no adenopathy, well-dressed, normal affect, normal respiratory effort. ?Examination demonstrates no progression of symptoms she has some tenderness to palpation more in the lower back than the thoracic spine.  She does have good strength with flexion extension.  Mild straight leg raise on the left reproduces her symptoms sensation is intact ? ?Imaging: ?No results found. ?No images are attached to the encounter. ? ?Labs: ?Lab Results  ?Component Value Date  ? HGBA1C 6.5  (H) 11/24/2020  ? HGBA1C 5.7 04/07/2020  ? ESRSEDRATE 53 (H) 05/29/2021  ? ESRSEDRATE 56 (H) 05/28/2021  ? ESRSEDRATE 53 (H) 05/04/2020  ? CRP 6.3 (H) 05/29/2021  ? CRP 8.0 (H) 05/28/2021  ? REPTSTATUS 02/21/2021 FINAL 02/15/2021  ? GRAMSTAIN  02/15/2021  ?  NO SQUAMOUS EPITHELIAL CELLS SEEN ?FEW WBC SEEN ?NO ORGANISMS SEEN ?  ? CULT  02/15/2021  ?  RARE ENTEROCOCCUS FAECALIS ?WITHIN MIXED ORGANISMS ?NO ANAEROBES ISOLATED ?Performed at [facility], [place] [address], [place], [place] [address] ?  ? LABORGA ENTEROCOCCUS FAECALIS 02/15/2021  ? ? ? ?Lab Results  ?Component Value Date  ? ALBUMIN 3.5 05/29/2021  ? ALBUMIN 3.5 05/28/2021  ? ALBUMIN 3.4 (L) 05/27/2021  ? ? ?Lab Results  ?Component Value Date  ? MG 1.9 05/29/2021  ? MG 2.0 05/27/2021  ? MG 2.2 11/27/2020  ? ?No results found for: VD25OH ? ?No results found for: PREALBUMIN ? ?  Latest Ref Rng & Units 05/29/2021  ?  3:35 AM 05/28/2021  ?  8:51 AM 05/27/2021  ?  3:55 AM  ?CBC EXTENDED  ?WBC 4.0 - 10.5 K/uL 5.8   4.7   6.0    ?RBC 3.87 - 5.11 MIL/uL 4.18   4.31   3.59    ?Hemoglobin 12.0 -  15.0 g/dL 11.3   11.6   9.9    ?HCT 36.0 - 46.0 % 37.2   39.0   33.5    ?Platelets 150 - 400 K/uL 140   129   133    ?NEUT# 1.7 - 7.7 K/uL 4.4   3.4   4.6    ?Lymph# 0.7 - 4.0 K/uL 0.7   0.6   0.6    ? ? ? ?There is no height or weight on file to calculate BMI. ? ?Orders:  ?No orders of the defined types were placed in this encounter. ? ?No orders of the defined types were placed in this encounter. ? ? ? Procedures: ?No procedures performed ? ?Clinical Data: ?No additional findings. ? ?ROS: ? ?All other systems negative, except as noted in the HPI. ?Review of Systems ? ?Objective: ?Vital Signs: There were no vitals taken for this visit. ? ?Specialty Comments:  ?No specialty comments available. ? ?PMFS History: ?Patient Active Problem List  ? Diagnosis Date Noted  ? OSA (obstructive sleep apnea) 07/11/2021  ? Depression 05/28/2021  ? Encounter for care related to Port-a-Cath  05/27/2021  ? Acute on chronic respiratory failure with hypoxia ([place]) 05/26/2021  ? Morbid obesity with BMI of 50.0-59.9, adult ([place]) 05/26/2021  ? Acute respiratory failure ([place]) 11/23/2020  ? Acute respiratory failure with hypoxia ([place]) 11/22/2020  ? Fever 11/22/2020  ? Bilateral lower extremity edema 11/22/2020  ? History of DVT (deep vein thrombosis) 11/22/2020  ? Asthma, chronic, moderate persistent, with acute exacerbation 11/22/2020  ? Lower limb ulcer, calf, left, limited to breakdown of skin ([place]) 06/09/2020  ? Lymphedema 06/09/2020  ? Iron deficiency anemia due to chronic blood loss 05/10/2020  ? 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 Venous insufficiency 04/13/2020  ? S/P insertion of IVC (inferior vena caval) filter 04/13/2020  ? Chronic anemia 04/13/2020  ? Essential hypertension 04/13/2020  ? (HFpEF) heart failure with preserved ejection fraction (HCC) -> although echo suggests normal diastolic parameters with normal left atrial size 04/13/2020  ? CAD (coronary artery disease) 11/17/2019  ? ?Past Medical History:  ?Diagnosis Date  ? (HFpEF) heart failure with preserved ejection fraction (HCC) -> although echo suggests normal diastolic parameters with normal left atrial size 04/13/2020  ? Cellulitis   ? Coronary artery disease involving native coronary artery without angina pectoris   ? PT STATES - HAD A CARDIAC CATH - NOT TOLD SHE HAD CAD -> week note from [place] indicates history of MI (patient cannot corroborate  ? Diabetes mellitus without complication ([place])   ? DVT (deep venous thrombosis) ([place]) 09/17/2017  ? 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 Generalized anxiety disorder   ? H/O gastric ulcer 11/16/2018  ? History of small bowel obstruction   ? In childhood  ? Hypertension   ? Iron deficiency anemia due to chronic blood loss   ? Previously been followed by hematology for iron infusion every 2 weeks and as  of 2019; full GI evaluation including capsule endoscopy negative.  ? Morbid obesity due to excess calories ([place])   ? OSA (obstructive sleep apnea) 07/11/2021  ? Sleep study April 06, 2019 (Dr. Halford Chessman): Moderate OSA (AHI of 24.4 and SPO2 low of 77%).  Majority events during REM sleep.-recommend CPAP, oral appliance or surgical. Reviewed sleep hygiene.  Avoid sedatives.    ? Osteoarthritis of left knee   ? Prediabetes   ? Small bowel obstruction ([place])   ?  as a child  ? Speech impediment   ? Stutter / stammer  ?  ?Family History  ?Problem Relation Age of Onset  ? Diabetes Mellitus II Mother   ? COPD Father   ? Diabetes Father   ? Diabetes Mellitus II Maternal Grandmother   ? Breast cancer Paternal Grandfather   ?  ?Past Surgical History:  ?Procedure Laterality Date  ? ABDOMINAL WALL DEFECT REPAIR  1970  ? IR CV LINE INJECTION  10/24/2020  ? IR REMOVAL TUN ACCESS W/ PORT W/O FL MOD SED  05/28/2021  ? IVC FILTER INSERTION  2017  ? Lower Extremity Venous Duplex  06/23/2020  ? No evidence of DVT or superficial thrombosis bilaterally.  No evidence of deep venous insufficiency bilaterally.  No evidence of SSV reflux.  Right GSV in the calf has reflux, no reflux in L GSV.;  Repeated in July 2020-no DVT  ? OOPHORECTOMY  1996  ? OOPHORECTOMY  1997  ? PORTACATH PLACEMENT  2014  ? TRANSTHORACIC ECHOCARDIOGRAM  11/25/2020  ? EF 55 to 60%.  Normal LV function.  No RWM A.  Mild LVH.  GR 1 DD.  Normal PAP.  Normal aortic and mitral valve.  Mildly elevated RAP  ~8 mmHg.  ? ?Social History  ? ?Occupational History  ? Occupation: unemployed on [employer]  ?Tobacco Use  ? Smoking status: Former  ? Smokeless tobacco: Never  ?Vaping Use  ? Vaping Use: Never used  ?Substance and Sexual Activity  ? Alcohol use: Not Currently  ? Drug use: Not Currently  ? Sexual activity: Not Currently  ? ? ? ? ? ?

## 2021-08-01 ENCOUNTER — Ambulatory Visit: Payer: Medicaid Other | Attending: Physician Assistant | Admitting: Physical Therapy

## 2021-08-01 ENCOUNTER — Encounter: Payer: Self-pay | Admitting: Physical Therapy

## 2021-08-01 DIAGNOSIS — M545 Low back pain, unspecified: Secondary | ICD-10-CM | POA: Diagnosis present

## 2021-08-01 DIAGNOSIS — R2689 Other abnormalities of gait and mobility: Secondary | ICD-10-CM | POA: Diagnosis present

## 2021-08-01 DIAGNOSIS — M546 Pain in thoracic spine: Secondary | ICD-10-CM | POA: Diagnosis present

## 2021-08-01 DIAGNOSIS — M6281 Muscle weakness (generalized): Secondary | ICD-10-CM | POA: Diagnosis present

## 2021-08-01 DIAGNOSIS — G8929 Other chronic pain: Secondary | ICD-10-CM | POA: Insufficient documentation

## 2021-08-01 NOTE — Therapy (Signed)
?OUTPATIENT PHYSICAL THERAPY TREATMENT NOTE ? ? ?Patient Name: [name] ?MRN: [id number] ?DOB:01-28-1971, 51 y.o., female ?Today's Date: 08/01/2021 ? ?PCP: Ladell Pier, MD ?REFERRING PROVIDER: Persons, Bevely Palmer, [place] ? ? PT End of Session - 08/01/21 1229   ? ? Visit Number 2   ? Date for PT Re-Evaluation 09/14/21   ? Authorization Type [employer] MCD   ? PT Start Time 1230   pt arrived late  ? PT Stop Time 3762   ? PT Time Calculation (min) 27 min   ? ?  ?  ? ?  ? ? ?Past Medical History:  ?Diagnosis Date  ? (HFpEF) heart failure with preserved ejection fraction (HCC) -> although echo suggests normal diastolic parameters with normal left atrial size 04/13/2020  ? Cellulitis   ? Coronary artery disease involving native coronary artery without angina pectoris   ? PT STATES - HAD A CARDIAC CATH - NOT TOLD SHE HAD CAD -> week note from [place] indicates history of MI (patient cannot corroborate  ? Diabetes mellitus without complication ([place])   ? DVT (deep venous thrombosis) ([place]) 09/17/2017  ? 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 Generalized anxiety disorder   ? H/O gastric ulcer 11/16/2018  ? History of small bowel obstruction   ? In childhood  ? Hypertension   ? Iron deficiency anemia due to chronic blood loss   ? Previously been followed by hematology for iron infusion every 2 weeks and as of 2019; full GI evaluation including capsule endoscopy negative.  ? Morbid obesity due to excess calories ([place])   ? OSA (obstructive sleep apnea) 07/11/2021  ? Sleep study April 06, 2019 (Dr. Halford Chessman): Moderate OSA (AHI of 24.4 and SPO2 low of 77%).  Majority events during REM sleep.-recommend CPAP, oral appliance or surgical. Reviewed sleep hygiene.  Avoid sedatives.    ? Osteoarthritis of left knee   ? Prediabetes   ? Small bowel obstruction ([place])   ? as a child  ? Speech impediment   ? Stutter / stammer  ? ?Past Surgical History:  ?Procedure Laterality Date  ? ABDOMINAL WALL DEFECT REPAIR   1970  ? IR CV LINE INJECTION  10/24/2020  ? IR REMOVAL TUN ACCESS W/ PORT W/O FL MOD SED  05/28/2021  ? IVC FILTER INSERTION  2017  ? Lower Extremity Venous Duplex  06/23/2020  ? No evidence of DVT or superficial thrombosis bilaterally.  No evidence of deep venous insufficiency bilaterally.  No evidence of SSV reflux.  Right GSV in the calf has reflux, no reflux in L GSV.;  Repeated in July 2020-no DVT  ? OOPHORECTOMY  1996  ? OOPHORECTOMY  1997  ? PORTACATH PLACEMENT  2014  ? TRANSTHORACIC ECHOCARDIOGRAM  11/25/2020  ? EF 55 to 60%.  Normal LV function.  No RWM A.  Mild LVH.  GR 1 DD.  Normal PAP.  Normal aortic and mitral valve.  Mildly elevated RAP  ~8 mmHg.  ? ?Patient Active Problem List  ? Diagnosis Date Noted  ? OSA (obstructive sleep apnea) 07/11/2021  ? Depression 05/28/2021  ? Encounter for care related to Port-a-Cath 05/27/2021  ? Acute on chronic respiratory failure with hypoxia ([place]) 05/26/2021  ? Morbid obesity with BMI of 50.0-59.9, adult ([place]) 05/26/2021  ? Acute respiratory failure ([place]) 11/23/2020  ? Acute respiratory failure with hypoxia ([place]) 11/22/2020  ? Fever 11/22/2020  ? Bilateral lower extremity edema 11/22/2020  ? History of DVT (deep vein thrombosis) 11/22/2020  ?  Asthma, chronic, moderate persistent, with acute exacerbation 11/22/2020  ? Lower limb ulcer, calf, left, limited to breakdown of skin ([place]) 06/09/2020  ? Lymphedema 06/09/2020  ? Iron deficiency anemia due to chronic blood loss 05/10/2020  ? 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 Venous insufficiency 04/13/2020  ? S/P insertion of IVC (inferior vena caval) filter 04/13/2020  ? Chronic anemia 04/13/2020  ? Essential hypertension 04/13/2020  ? (HFpEF) heart failure with preserved ejection fraction (HCC) -> although echo suggests normal diastolic parameters with normal left atrial size 04/13/2020  ? CAD (coronary artery disease) 11/17/2019  ? ? ?THERAPY DIAG:  ?Chronic  low back pain, unspecified back pain laterality, unspecified whether sciatica present ? ?Pain in thoracic spine ? ?Muscle weakness ? ?Other abnormalities of gait and mobility ? ?REFERRING DIAG: Referral diagnosis: Acute right-sided low back pain with sciatica, sciatica laterality unspecified [M54.40] ? ?PERTINENT HISTORY: DVT (L calf, chronic), long term use of blood thinners, Asthma, morbid obesity, lymphedema, non healing wound L leg, DM TII, CHF (monitor during supine activities) ? ?PRECAUTIONS/RESTRICTIONS:  ? ?Fall ? ?SUBJECTIVE:  ?Pt reports that she continues to have pain in her tx and lx spine.  She feels that the exercises are somewhat helpful ? ?Pain:  ?Are you having pain? Yes ?Pain location: upper tx to lower lumbar spine ?NPRS scale:  ?current 9/10  ?Aggravating factors: movement ?Relieving factors: sitting down (helps for short periods)       ?Pain description: constant, sharp, and aching ?Stage: Chronic ? ?OBJECTIVE: (taken on eval unless noted) ? ?MUSCLE LENGTH: ?Hamstrings: Right moderate restriction; Left no restriction ?ASLR: Right ASLR significantly reduced compared to PSLR ; Left ASLR significantly reduced compared to PSLR  ?  ?  ?LUMBAR AROM ?  ?AROM AROM  ?07/20/2021  ?Flexion WNL, w/ concordant pain  ?Extension limited by 25%, w/ concordant pain  ?Right lateral flexion limited by 25%, w/ concordant pain  ?Left lateral flexion limited by 50%, w/ concordant pain  ?Right rotation limited by 25%, w/ concordant pain  ?Left rotation limited by 25%, w/ concordant pain  ?  ?(Blank rows = not tested) ?  ?  ?LE MMT: ?  ?MMT Right ?07/20/2021 Left ?07/20/2021  ?Hip flexion (L2, L3)      ?Knee extension (L3)      ?Knee flexion      ?Hip abduction      ?Hip extension      ?Hip external rotation      ?Hip internal rotation      ?Hip adduction      ?Ankle dorsiflexion (L4)      ?Ankle plantarflexion (S1)      ?Ankle inversion      ?Ankle eversion      ?Great Toe ext (L5)      ?Grossly 3+ 3+  ?  ?(Blank rows =  not tested, score listed is out of 5 possible points.  N = WNL, D = diminished, C = clear for gross weakness with myotome testing, * = concordant pain with testing) ?  ?Functional Tests ?  ?Eval (07/20/2021)      ?Sustained supine bridge (dominant leg extended at 120'', if reached): 0'' (norm 170'')      ?49'' STS: 9x  UE used? Y      ?       ?       ?       ?       ?       ?       ?       ?       ?       ?       ?       ?       ? ? ?  ASTERISK SIGNS ?  ?  ?Asterisk Signs Eval (07/20/2021)            ?R HS length Mod limitation            ?ASLR vs PSLR Significant deficit            ?pain 10/10            ?STS 9x w/ UE            ?Bridge endurance unable            ?  ?  ?HOME EXERCISE PROGRAM: ?Access Code: [email or web address] ?URL: [email or web address] ?Date: 07/20/2021 ?Prepared by: Shearon Balo ?  ?Exercises ?- Seated Hip Abduction with Pelvic Floor Contraction and Resistance Loop  - 1 x daily - 7 x weekly - 3 sets - 10 reps ?- Seated Long Arc Quad  - 1 x daily - 7 x weekly - 3 sets - 10 reps ?- Seated Isometric Hip Adduction with Pelvic Floor Contraction  - 1 x daily - 7 x weekly - 3 sets - 10 reps ?- Seated March with Resistance  - 1 x daily - 7 x weekly - 3 sets - 10 reps ? ?TREATMENT 4/5: ? ?Therapeutic Exercise: ?- nu-step L5 70m while taking subjective and planning session with patient ?- LTR - 20x ?- R HS stretch - 30''x3 ?- SLR from foam roller - partial ROM - x10 ea ?- bridge - partial ROM 2x5  ?- STS - 2x7 ?- standing row - GTB - 2x10 ?- standing shoulder ext - GTB- x10 ?- lumbar ext row (next visit) ?  ?ASSESSMENT: ?  ?CLINICAL IMPRESSION: ?Anetra is doing fair with therapy.  She is limited by pain throughout.  She also is considerably deconditioned and fatigues rapidly with exercise.  We will continue to progress as able. ?  ?OBJECTIVE IMPAIRMENTS: Pain, lumbar ROM, LE MMT ?  ?ACTIVITY LIMITATIONS: bending, squatting, lifting, standing, walking ?  ?PERSONAL FACTORS: See medical history and  pertinent history ?  ?  ?REHAB POTENTIAL: Fair chronic, PT has not been helpful in past ?  ?CLINICAL DECISION MAKING: Stable/uncomplicated ?  ?EVALUATION COMPLEXITY: Low ?  ?  ?GOALS: ?  ?  ?SHORT TERM GOALS:

## 2021-08-02 ENCOUNTER — Other Ambulatory Visit: Payer: Self-pay | Admitting: Internal Medicine

## 2021-08-02 ENCOUNTER — Encounter (HOSPITAL_BASED_OUTPATIENT_CLINIC_OR_DEPARTMENT_OTHER): Payer: Medicaid Other | Attending: Internal Medicine | Admitting: Internal Medicine

## 2021-08-02 ENCOUNTER — Other Ambulatory Visit: Payer: Self-pay | Admitting: Obstetrics and Gynecology

## 2021-08-02 DIAGNOSIS — I890 Lymphedema, not elsewhere classified: Secondary | ICD-10-CM | POA: Insufficient documentation

## 2021-08-02 DIAGNOSIS — Z86718 Personal history of other venous thrombosis and embolism: Secondary | ICD-10-CM | POA: Diagnosis not present

## 2021-08-02 DIAGNOSIS — L97822 Non-pressure chronic ulcer of other part of left lower leg with fat layer exposed: Secondary | ICD-10-CM | POA: Diagnosis not present

## 2021-08-02 DIAGNOSIS — I5032 Chronic diastolic (congestive) heart failure: Secondary | ICD-10-CM

## 2021-08-02 DIAGNOSIS — I10 Essential (primary) hypertension: Secondary | ICD-10-CM

## 2021-08-02 DIAGNOSIS — I87312 Chronic venous hypertension (idiopathic) with ulcer of left lower extremity: Secondary | ICD-10-CM | POA: Insufficient documentation

## 2021-08-02 NOTE — Patient Instructions (Signed)
Hi [name], thank you for calling me back today, have a nice day! ? ?[name] was given information about Medicaid Managed Care team care coordination services as a part of their [place] Medicaid benefit. [name] verbally consented to engagement with the [facility] Managed Care team.  ? ?If you are experiencing a medical emergency, please call 911 or report to your local emergency department or urgent care.  ? ?If you have a non-emergency medical problem during routine business hours, please contact your provider's office and ask to speak with a nurse.  ? ?For questions related to your [facility], please call: [phone] or visit the homepage here: [email or web address] ? ?If you would like to schedule transportation through your [facility], please call the following number at least 2 days in advance of your appointment: [phone]. ? Rides for urgent appointments can also be made after hours by calling Member Services. ? ?Call the [facility] at [phone], at any time, 24 hours a day, 7 days a week. If you are in danger or need immediate medical attention call 911. ? ?If you would like help to quit smoking, call 1-800-QUIT-NOW 5101730719) OR Espa?ol: 1-855-D?jelo-Ya 781-320-5148) o para m?s informaci?n haga clic aqu? or Text READY to 200-400 to register via text ? ?[name] - following are the goals we discussed in your visit today:  ? Goals Addressed   ? ?Timeframe:  Long-Range Goal ?Priority:  High ?Start Date:     07/02/21                        ?Expected End Date:     ongoing                 ? ?Follow Up Date 09/06/21 ?  ?- schedule appointment for flu shot ?- schedule appointment for vaccines needed due to my age or health ?- schedule recommended health tests (blood work, mammogram, colonoscopy, pap test) ?- schedule and keep  appointment for annual check-up  ?  ?Why is this important?   ?Screening tests can find diseases early when they are easier to treat.  ?Your doctor or nurse will talk with you about which tests are important for you.  ?Getting shots for common diseases like the flu and shingles will help prevent them.   ? 08/02/21:  Patient attending PT once a week and follow up appointments as  scheduled. ? ?Patient verbalizes understanding of instructions and care plan provided today and agrees to view in [place]. Active MyChart status confirmed with patient.   ? ?The Managed Medicaid care management team will reach out to the patient again over the next 30 days.  ?The  Patient has been provided with contact information for the Managed Medicaid care management team and has been advised to call with any health related questions or concerns.  ? ?[name], BSN ?[facility] ?Care Management Coordinator - Managed Medicaid High Risk ?[phone] ?  ?Following is a copy of your plan of care:  ?Care Plan : Secor of Care  ?Updates made by [name], RN since 08/02/2021 12:00 AM  ?  ? ?Problem: Chronic Disease Management and Care Coordination Needs   ?Priority: High  ?  ? ?Long-Range Goal: Self-Management Plan Developed   ?Start Date: 07/02/2021  ?Expected End Date: 10/02/2021  ?Recent Progress: Not on track  ?Priority: High  ?  Note:   ?Current Barriers:  ?Knowledge Deficits related to plan of care for management of back pain. lymphedema ?Care Coordination needs related to Limited support and back pain ?Chronic Disease Management support and education needs related to back pain. Lymphedema  ?08/02/21:  Patient attending PT once a week, wearing support stockings, compression wraps, una boot for left leg lymphedema ? ?RNCM Clinical Goal(s):  ?Patient will verbalize understanding of plan for management of back pain, lymphedema  as evidenced by patient report and attending PT sessions ?take all  medications exactly as prescribed and will call provider for medication related questions as evidenced by patient report ?attend all scheduled medical appointments as evidenced by patient report ?continue to work with RN Care Manager to address care management and care coordination needs related to  back pain, lymphedema as evidenced by adherence to CM Team Scheduled appointments ?work with Education officer, museum to address  anxiety.  Limited support and hisory of anxiety/depression related to the management of back pain  as evidenced by review of EMR and patient or social worker report through collaboration with Consulting civil engineer, provider, and care team.  ? ?Interventions: ?Inter-disciplinary care team collaboration (see longitudinal plan of care) ?Evaluation of current treatment plan related to  self management and patient's adherence to plan as established by provider ?Collaborated with [facility] to schedule an appt for her ?Collaborated with SW for anxiety ?SW referral for anxiety ? ?Pain Interventions:  (Status:  New goal.) Long Term Goal ?Pain assessment performed ?Medications reviewed ?Reviewed provider established plan for pain management ?Discussed importance of adherence to all scheduled medical appointments ?Counseled on the importance of reporting any/all new or changed pain symptoms or management strategies to pain management provider ?Advised patient to report to care team affect of pain on daily activities ?Discussed use of relaxation techniques and/or diversional activities to assist with pain reduction (distraction, imagery, relaxation, massage, acupressure, TENS, heat, and cold application ?Reviewed with patient prescribed pharmacological and nonpharmacological pain relief strategies ?Assessed social determinant of health barriers ? ?Patient Goals/Self-Care Activities: ?Take all medications as prescribed ?Attend all scheduled provider appointments ?Call pharmacy for medication refills 3-7 days  in advance of running out of medications ?Perform all self care activities independently  ?Perform IADL's (shopping, preparing meals, housekeeping, managing finances) independently ?Call provider office for new concerns or questions  ?Work with the Education officer, museum to address care coordination needs and will continue to work with the clinical team to address health care and disease management related needs ? ?Follow Up Plan:  The patient has been provided with contact information for the care management team and has been advised to call with any health related questions or concerns.  ?The care management team will reach out to the patient again over the next 30 days.   ? ?  ?

## 2021-08-02 NOTE — Progress Notes (Signed)
[name], [name] ([id number]) ?Visit Report for 08/02/2021 ?Chief Complaint Document Details ?Patient Name: Date of Service: ?[name], [name] 08/02/2021 8:45 A M ?Medical Record Number: [id number] ?Patient Account Number: [id number] ?Date of Birth/Sex: Treating RN: ?03/28/1971 (51 y.o. [name] ?Primary Care Provider: [name] Other Clinician: ?Referring Provider: ?Treating Provider/Extender: [name] ?[name] ?Weeks in Treatment: 1 ?Information Obtained from: Patient ?Chief Complaint ?07/23/2021; Left lower extremity wound ?Electronic Signature(s) ?Signed: 08/02/2021 12:11:35 PM By: [name] DO ?Entered By: [name] on 08/02/2021 11:19:26 ?-------------------------------------------------------------------------------- ?Debridement Details ?Patient Name: Date of Service: ?[name], [name] 08/02/2021 8:45 A M ?Medical Record Number: [id number] ?Patient Account Number: [id number] ?Date of Birth/Sex: Treating RN: ?1971-01-03 (51 y.o. F) Deaton, Bobbi ?Primary Care Provider: [name] Other Clinician: ?Referring Provider: ?Treating Provider/Extender: [name] ?[name] ?Weeks in Treatment: 1 ?Debridement Performed for Assessment: Wound #7 Left,Proximal,Anterior Lower Leg ?Performed By: Physician [name], DO ?Debridement Type: Debridement ?Severity of Tissue Pre Debridement: Fat layer exposed ?Level of Consciousness (Pre-procedure): Awake and Alert ?Pre-procedure Verification/Time Out Yes - 08:55 ?Taken: ?Start Time: 08:56 ?Pain Control: Lidocaine 5% topical ointment ?T Area Debrided (L x W): ?otal 1.3 (cm) x 1.2 (cm) = 1.56 (cm?) ?Tissue and other material debrided: ?Viable, Non-Viable, Slough, Subcutaneous, Skin: Dermis , Skin: Epidermis, Slough ?Level: Skin/Subcutaneous Tissue ?Debridement Description: Excisional ?Instrument: Curette ?Bleeding: Minimum ?Hemostasis Achieved: Pressure ?End Time: 08:57 ?Procedural Pain: 0 ?Post Procedural  Pain: 0 ?Response to Treatment: Procedure was tolerated well ?Level of Consciousness (Post- Awake and Alert ?procedure): ?Post Debridement Measurements of Total Wound ?Length: (cm) 1.3 ?Width: (cm) 1.2 ?Depth: (cm) 0.1 ?Volume: (cm?) 0.123 ?Character of Wound/Ulcer Post Debridement: Improved ?Severity of Tissue Post Debridement: Fat layer exposed ?Post Procedure Diagnosis ?Same as Pre-procedure ?Electronic Signature(s) ?Signed: 08/02/2021 12:11:35 PM By: [name] DO ?Signed: 08/02/2021 5:34:01 PM By: Deon Pilling RN, BSN ?Entered By: Deon Pilling on 08/02/2021 08:56:37 ?-------------------------------------------------------------------------------- ?Debridement Details ?Patient Name: ?Date of Service: ?[name], [name] 08/02/2021 8:45 A M ?Medical Record Number: [id number] ?Patient Account Number: [id number] ?Date of Birth/Sex: ?Treating RN: ?June 25, 1970 (51 y.o. F) Deaton, Bobbi ?Primary Care Provider: [name] ?Other Clinician: ?Referring Provider: ?Treating Provider/Extender: [name] ?[name] ?Weeks in Treatment: 1 ?Debridement Performed for Assessment: Wound #8 Left,Distal,Anterior Lower Leg ?Performed By: Physician [name], DO ?Debridement Type: Debridement ?Severity of Tissue Pre Debridement: Fat layer exposed ?Level of Consciousness (Pre-procedure): Awake and Alert ?Pre-procedure Verification/Time Out Yes - 08:55 ?Taken: ?Start Time: 08:56 ?Pain Control: Lidocaine 5% topical ointment ?T Area Debrided (L x W): ?otal 0.9 (cm) x 0.3 (cm) = 0.27 (cm?) ?Tissue and other material debrided: ?Viable, Non-Viable, Slough, Subcutaneous, Skin: Dermis , Skin: Epidermis, Slough ?Level: Skin/Subcutaneous Tissue ?Debridement Description: Excisional ?Instrument: Curette ?Bleeding: Minimum ?Hemostasis Achieved: Pressure ?End Time: 08:57 ?Procedural Pain: 0 ?Post Procedural Pain: 0 ?Response to Treatment: Procedure was tolerated well ?Level of Consciousness (Post- Awake and  Alert ?procedure): ?Post Debridement Measurements of Total Wound ?Length: (cm) 0.9 ?Width: (cm) 0.3 ?Depth: (cm) 0.1 ?Volume: (cm?) 0.021 ?Character of Wound/Ulcer Post Debridement: Improved ?Severity of Tissue Post Debridement: Fat layer exposed ?Post Procedure Diagnosis ?Same as Pre-procedure ?Electronic Signature(s) ?Signed: 08/02/2021 12:11:35 PM By: [name] DO ?Signed: 08/02/2021 5:34:01 PM By: Deon Pilling RN, BSN ?Entered By: Deon Pilling on 08/02/2021 08:57:08 ?-------------------------------------------------------------------------------- ?HPI Details ?Patient Name: Date of Service: ?[name], [name] 08/02/2021 8:45 A M ?Medical Record Number: [id number] ?Patient Account Number: [id number] ?Date of Birth/Sex: Treating RN: ?[birth year] (51 y.o. F)  [name] ?Primary Care Provider: [name] Other Clinician: ?Referring Provider: ?Treating Provider/Extender: [name] ?[name] ?Weeks in Treatment: 1 ?History of Present Illness ?HPI Description: Admission 6/24 ?Ms. Reid Nawrot is a 51 year old female with a past medical history of chronic venous insufficiency, lymphedema, DVT on anticoagulation and diastolic ?heart failure that presents to the clinic for left lower extremity wound. She was last seen 4 months ago in our clinic for the same issue. The reoccurring wound ?started at the end of May and has been going on for 1 month. She has been using an ointment and I am unclear what this is. She tries to keep her leg elevated ?with her compression stocking. She also reports she has lymphedema pumps and has been using them as well. ?She reports mild pain to the area. She denies signs of infection including increased warmth, erythema or purulent drainage. ?7/1; patient presents for 1 week follow-up. She has tolerated the wrap well. Unfortunately she did have trouble with the wrap Sliding down her leg 2 days ago. ?She has no issues or complaints today. She denies signs of  infection. ?7/15; patient presents for follow-up. She has tolerated the wrap well. She has no issues or complaints today. She denies signs of infection. ?7/25; patient presents for 1 week follow-up. She has no issues or complaints today. She denies signs of infection. ?8/4; patient presents for follow-up. She has tolerated the compression wrap well to her left lower extremity. She states she was in the hospital last week due to ?fluid overload. She subsequently developed blisters to her bottom from being in the hospital bed for prolonged periods of time. These have since ruptured. She ?now has 2 areas of skin breakdown. She has been keeping gauze on them. She denies signs of infection. ?8/18; patient presents for 2-week follow-up. She reports improvement to her buttocks wounds. She is able to tolerate the wrap well to her left lower extremity ?with no issues. She denies signs of infection. ?9/1; patient presents for 2-week follow-up. She has no issues or complaints today. she denies signs of infection. ?9/15; patient presents for 2-week follow-up. She has no issues or complaints today. She has tolerated the wrap well. She denies signs of infection. ?9/27; patient presents for 1 week follow-up. She has no issues or complaints today. ?10/20; patient presents for follow-up. She was on vacation for the past 3 weeks. She states she had the last clinic visit wrap in place for 1 week and then she ?took this off. She states that she has been using her compression wraps since. She states that the wound has gotten larger. She reports pain to the wound ?site. She denies fever/chills, nausea/vomiting. She denies purulent drainage. ?10/24; patient presents for follow-up. She states she tolerated the Kerlix/Coban wrap well. She reports decrease in pain. She states she started Keflex. She ?denies systemic signs of infection. ?10/28; patient presents for follow-up. She has no issues or complaints today. She denies acute pain. She  tolerated the wrap well. ?11/4; patient presents for follow-up. She has no issues or complaints. She denies signs of infection. ?11/11; patient presents for follow-up. She has no issues or complaints today. She denies signs of

## 2021-08-02 NOTE — Patient Outreach (Signed)
Medicaid Managed Care   Nurse Care Manager Note  08/02/2021 Name:  [name] MRN:  [id number] DOB:  29-Apr-1971  [name] is an 51 y.o. year old female who is a primary patient of Megan Matar, MD.  The [facility] Managed Care Coordination team was consulted for assistance with:    Chronic healthcare management needs, back pain, lymphedema, h/o anxiety/depression, asthma, HTN, CHF  [name] was given information about Medicaid Managed Care Coordination team services today. [name] Patient agreed to services and verbal consent obtained.  Engaged with patient by telephone for follow up visit in response to provider referral for case management and/or care coordination services.   Assessments/Interventions:  Review of past medical history, allergies, medications, health status, including review of consultants reports, laboratory and other test data, was performed as part of comprehensive evaluation and provision of chronic care management services.  SDOH (Social Determinants of Health) assessments and interventions performed: SDOH Interventions    Flowsheet Row Most Recent Value  SDOH Interventions   Housing Interventions Intervention Not Indicated  Physical Activity Interventions Intervention Not Indicated  [not physically able to]       Care Plan  Allergies  Allergen Reactions   Ace Inhibitors Rash and Other (See Comments)    Make pt bleed   Aspirin Other (See Comments)    Per patient paperwork: blood clot?  Likely because of chronic DOAC   Hydromorphone Hives and Itching   Vancomycin Itching and Rash   Contrast Media [Iodinated Contrast Media] Hives   Dilaudid [Hydromorphone Hcl] Hives   Lidocaine Itching    Itching with Lidocaine patch reported 06/13/2021 vis telephone message.   Medications Reviewed Today     Reviewed by Danie Chandler, RN (Registered Nurse) on 08/02/21 at 1110  Med List Status: <None>   Medication Order  Taking? Sig Documenting Provider Last Dose Status Informant  Accu-Chek Softclix Lancets lancets [id number] No Use as instructed  Patient taking differently: 1 each by Other route See admin instructions. Use as instructed   Anders Simmonds, PA-C Taking Active Self  acetaminophen (TYLENOL) 500 MG tablet [id number] No Take 1 tablet (500 mg total) by mouth every 6 (six) hours as needed.  Patient taking differently: Take 500 mg by mouth every 6 (six) hours as needed for mild pain.   Megan Matar, MD Taking Active Self  albuterol [facility] HFA) 108 [phone] Base) MCG/ACT inhaler [id number] No INHALE TWO PUFFS BY MOUTH EVERY 6 HOURS AS NEEDED FOR WHEEZING OR SHORTNESS OF BREATH  Patient taking differently: Inhale 2 puffs into the lungs See admin instructions. INHALE TWO PUFFS BY MOUTH EVERY 6 HOURS AS NEEDED FOR WHEEZING OR SHORTNESS OF Benay Spice, MD Taking Active Self  amLODipine (NORVASC) 5 MG tablet [id number] No TAKE 1/2 TABLET BY MOUTH DAILY  Patient taking differently: Take 2.5 mg by mouth daily.   Megan Matar, MD Taking Active Self  atorvastatin (LIPITOR) 40 MG tablet [id number] No TAKE 1 TABLET BY MOUTH DAILY Megan Matar, MD Taking Active   Blood Glucose Calibration (ACCU-CHEK GUIDE CONTROL) LIQD [id number] No 1 each by In Vitro route daily. Anders Simmonds, PA-C Taking Active Self  Blood Glucose Monitoring Suppl (ACCU-CHEK GUIDE) w/Device KIT [id number] No 1 each by Does not apply route 2 (two) times daily. Anders Simmonds, PA-C Taking Active Self  Carboxymethylcellulose Sodium (EYE DROPS OP) [id number] No Place 1 drop into both eyes daily as needed (dry eyes). [provider] Taking Active Self  carvedilol (COREG) 25 MG tablet [id number] No TAKE ONE TABLET BY MOUTH TWICE A DAY WITH MEALS  Patient taking differently: Take 25 mg by mouth 2 (two) times daily with a meal.   Megan Matar, MD Taking Active Self  cholecalciferol (VITAMIN D3) 25 MCG (1000  UNIT) tablet [id number] No Take 1 tablet (1,000 Units total) by mouth daily. Megan Matar, MD Taking Active Self  COVID-19 mRNA Vac-TriS, Pfizer, (PFIZER-BIONT COVID-19 VAC-TRIS) SUSP injection [id number] No Inject into the muscle. Judyann Munson, MD Taking Active   dapagliflozin propanediol (FARXIGA) 10 MG TABS tablet [id number] No Take 1 tablet (10 mg total) by mouth daily. Megan Matar, MD Taking Active Self  DULERA 200-5 MCG/ACT Sandrea Matte [id number] No INHALE 2 PUFFS INTO THE LUNGS TWO TIMES A DAY  Patient taking differently: Inhale 2 puffs into the lungs daily.   Megan Matar, MD Taking Active Self  ELIQUIS 5 MG TABS tablet [id number] No TAKE ONE TABLET BY MOUTH TWICE A DAY  Patient taking differently: Take 5 mg by mouth 2 (two) times daily.   Megan Matar, MD Taking Active Self  FEROSUL 325 (65 Fe) MG tablet [id number] No TAKE ONE TABLET BY MOUTH DAILY WITH BREAKFAST Megan Matar, MD Taking Active   furosemide (LASIX) 80 MG tablet [id number] No Take 1 tablet (80 mg total) by mouth daily. Take additional dose of lasix for weight gain, leg swelling Dorcas Carrow, MD Taking Active   gabapentin (NEURONTIN) 100 MG capsule [id number] No TAKE 1 CAPSULE BY MOUTH THREE TIMES A DAY AS NEEDED FOR PAN  Patient taking differently: Take 100 mg by mouth 3 (three) times daily as needed (pain).   Megan Matar, MD Taking Active Self  glucose blood test strip [id number] No Use as instructed  Patient taking differently: 1 each by Other route See admin instructions. Use as instructed   Anders Simmonds, PA-C Taking Active Self  HYDROcodone-acetaminophen (NORCO/VICODIN) 5-325 MG tablet [id number] No Take 1 tablet by mouth every 12 (twelve) hours as needed for moderate pain (severe pain). Persons, [place], [place] Taking Active   hydrocortisone cream 1 % [id number] No Apply 1 application topically daily as needed for itching. [provider] Taking Active Self  hydrOXYzine (VISTARIL)  25 MG capsule [id number] No Take 1 capsule (25 mg total) by mouth at bedtime as needed.  Patient taking differently: Take 25 mg by mouth at bedtime as needed for anxiety or itching.   Megan Matar, MD Taking Active Self  KLOR-CON M20 20 MEQ tablet [id number] No TAKE ONE TABLET BY MOUTH DAILY  Patient taking differently: Take 20 mEq by mouth daily.   Megan Matar, MD Taking Active Self  methocarbamol (ROBAXIN) 500 MG tablet [id number] No TAKE ONE TABLET BY MOUTH DAILY AS NEEDED FOR MUSCLE SPASMS  Patient taking differently: Take 500 mg by mouth See admin instructions. TAKE ONE TABLET BY MOUTH DAILY AS NEEDED FOR MUSCLE SPASMS   Megan Matar, MD Taking Active Self  nitroGLYCERIN (NITROSTAT) 0.4 MG SL tablet [id number] No Place 1 tablet (0.4 mg total) under the tongue every 5 (five) minutes as needed for chest pain. Glade Lloyd, MD Taking Active Self  omeprazole (PRILOSEC) 20 MG capsule [id number] No Take 1 capsule (20 mg total) by mouth daily as needed (acid reflux). Ivonne Andrew, NP 05/25/2021 Expired 05/31/21 2359 Self  PARoxetine (PAXIL) 10 MG tablet [id number] No Take 1 tablet (10 mg total) by mouth daily.  Megan Matar, MD Taking Active   spironolactone (ALDACTONE) 25 MG tablet [id number] No TAKE ONE TABLET BY MOUTH DAILY Megan Matar, MD Taking Active   valsartan (DIOVAN) 160 MG tablet [id number] No Take 1 tablet (160 mg total) by mouth daily. Marykay Lex, MD Taking Active Self  vitamin B-12 (CYANOCOBALAMIN) 1000 MCG tablet [id number] No Take 1,000 mcg by mouth daily. [provider] Taking Active Self           Patient Active Problem List   Diagnosis Date Noted   OSA (obstructive sleep apnea) 07/11/2021   Depression 05/28/2021   Encounter for care related to Port-a-Cath 05/27/2021   Acute on chronic respiratory failure with hypoxia (HCC) 05/26/2021   Morbid obesity with BMI of 50.0-59.9, adult (HCC) 05/26/2021   Acute respiratory failure (HCC)  11/23/2020   Acute respiratory failure with hypoxia (HCC) 11/22/2020   Fever 11/22/2020   Bilateral lower extremity edema 11/22/2020   History of DVT (deep vein thrombosis) 11/22/2020   Asthma, chronic, moderate persistent, with acute exacerbation 11/22/2020   Lower limb ulcer, calf, left, limited to breakdown of skin (HCC) 06/09/2020   Lymphedema 06/09/2020   Iron deficiency anemia due to chronic blood loss 05/10/2020   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 (HCC) 04/13/2020   Venous insufficiency 04/13/2020   S/P insertion of IVC (inferior vena caval) filter 04/13/2020   Chronic anemia 04/13/2020   Essential hypertension 04/13/2020   (HFpEF) heart failure with preserved ejection fraction (HCC) -> although echo suggests normal diastolic parameters with normal left atrial size 04/13/2020   CAD (coronary artery disease) 11/17/2019   Conditions to be addressed/monitored per PCP order:  Chronic healthcare management needs, back pain, lymphedema, h/o anxiety/depression, asthma, HTN, CHF, CAD, OSA  Care Plan : RN Care Manager Plan of Care  Updates made by Danie Chandler, RN since 08/02/2021 12:00 AM     Problem: Chronic Disease Management and Care Coordination Needs   Priority: High     Long-Range Goal: Self-Management Plan Developed   Start Date: 07/02/2021  Expected End Date: 10/02/2021  Recent Progress: Not on track  Priority: High  Note:   Current Barriers:  Knowledge Deficits related to plan of care for management of back pain. lymphedema Care Coordination needs related to Limited support and back pain Chronic Disease Management support and education needs related to back pain. Lymphedema  08/02/21:  Patient attending PT once a week, wearing support stockings, compression wraps, una boot for left leg lymphedema  RNCM Clinical Goal(s):  Patient will verbalize understanding of plan for management of back pain, lymphedema  as evidenced by patient  report and attending PT sessions take all medications exactly as prescribed and will call provider for medication related questions as evidenced by patient report attend all scheduled medical appointments as evidenced by patient report continue to work with RN Care Manager to address care management and care coordination needs related to  back pain, lymphedema as evidenced by adherence to CM Team Scheduled appointments work with Child psychotherapist to address  anxiety.  Limited support and hisory of anxiety/depression related to the management of back pain  as evidenced by review of EMR and patient or social worker report through collaboration with Medical illustrator, provider, and care team.   Interventions: Inter-disciplinary care team collaboration (see longitudinal plan of care) Evaluation of current treatment plan related to  self management and patient's adherence to plan as established by provider Collaborated with  [facility] to schedule an appt for her Collaborated with SW for anxiety SW referral for anxiety  Pain Interventions:  (Status:  New goal.) Long Term Goal Pain assessment performed Medications reviewed Reviewed provider established plan for pain management Discussed importance of adherence to all scheduled medical appointments Counseled on the importance of reporting any/all new or changed pain symptoms or management strategies to pain management provider Advised patient to report to care team affect of pain on daily activities Discussed use of relaxation techniques and/or diversional activities to assist with pain reduction (distraction, imagery, relaxation, massage, acupressure, TENS, heat, and cold application Reviewed with patient prescribed pharmacological and nonpharmacological pain relief strategies Assessed social determinant of health barriers  Patient Goals/Self-Care Activities: Take all medications as prescribed Attend all scheduled provider  appointments Call pharmacy for medication refills 3-7 days in advance of running out of medications Perform all self care activities independently  Perform IADL's (shopping, preparing meals, housekeeping, managing finances) independently Call provider office for new concerns or questions  Work with the social worker to address care coordination needs and will continue to work with the clinical team to address health care and disease management related needs  Follow Up Plan:  The patient has been provided with contact information for the care management team and has been advised to call with any health related questions or concerns.  The care management team will reach out to the patient again over the next 30 days.     Long-Range Goal: Establish Plan of Care for Chronic Disease Management Needs   Start Date: 07/02/2021  Expected End Date: 10/02/2021  Priority: High  Note:   Timeframe:  Long-Range Goal Priority:  High Start Date:     07/02/21                        Expected End Date:     ongoing                  Follow Up Date 09/06/21   - schedule appointment for flu shot - schedule appointment for vaccines needed due to my age or health - schedule recommended health tests (blood work, mammogram, colonoscopy, pap test) - schedule and keep appointment for annual check-up    Why is this important?   Screening tests can find diseases early when they are easier to treat.  Your doctor or nurse will talk with you about which tests are important for you.  Getting shots for common diseases like the flu and shingles will help prevent them.    08/02/21:  Patient attending PT once a week and follow up appointments as  scheduled.    Follow Up:  Patient agrees to Care Plan and Follow-up.  Plan: The Managed Medicaid care management team will reach out to the patient again over the next 30 days. and The  Patient has been provided with contact information for the Managed Medicaid care management team and  has been advised to call with any health related questions or concerns.  Date/time of next scheduled RN care management/care coordination outreach:  09/06/21 at 1100

## 2021-08-02 NOTE — Patient Outreach (Signed)
Care Coordination ? ?08/02/2021 ? ?[name] ?[birth year] ?[id number] ? ? ?Medicaid Managed Care  ? ?Unsuccessful Outreach Note ? ?08/02/2021 ?Name: [name] MRN: [id number] DOB: 04-14-1971 ? ?Referred by: Ladell Pier, MD ?Reason for referral : High Risk Managed Medicaid (Unsuccessful telephone outreach) ? ?A second unsuccessful telephone outreach, RN and SW, was attempted today. The patient was referred to the case management team for assistance with care management and care coordination.  ? ?Follow Up Plan: The care management team will reach out to the patient again over the next 30 business  days.  ? ?[name], BSN ?[facility] ?Care Management Coordinator - Managed Medicaid High Risk ?[phone] ?  ? ? ?

## 2021-08-02 NOTE — Progress Notes (Signed)
SARAN, LAVIOLETTE D. ([id number]) ?Visit Report for 08/02/2021 ?Arrival Information Details ?Patient Name: Date of Service: ?[name], [name] 08/02/2021 8:45 A M ?Medical Record Number: [id number] ?Patient Account Number: [id number] ?Date of Birth/Sex: Treating RN: ?1971-03-08 (51 y.o. F) Deaton, Bobbi ?Primary Care Deshon Hsiao: [name] Other Clinician: ?Referring Janeshia Ciliberto: ?Treating Jadarius Commons/Extender: [name] ?[name] ?Weeks in Treatment: 1 ?Visit Information History Since Last Visit ?Added or deleted any medications: No ?Patient Arrived: Ambulatory ?Any new allergies or adverse reactions: No ?Arrival Time: 08:20 ?Had a fall or experienced change in No ?Accompanied By: self ?activities of daily living that may affect ?Transfer Assistance: None ?risk of falls: ?Patient Identification Verified: Yes ?Signs or symptoms of abuse/neglect since last visito No ?Secondary Verification Process Completed: Yes ?Hospitalized since last visit: No ?Patient Requires Transmission-Based Precautions: No ?Implantable device outside of the clinic excluding No ?Patient Has Alerts: Yes ?cellular tissue based products placed in the center ?Patient Alerts: Patient on Blood Thinner since last visit: ?L ABI=[place] Has Dressing in Place as Prescribed: No ?Pain Present Now: No ?Electronic Signature(s) ?Signed: 08/02/2021 5:34:01 PM By: Deon Pilling RN, BSN ?Entered By: Deon Pilling on 08/02/2021 08:21:02 ?-------------------------------------------------------------------------------- ?Compression Therapy Details ?Patient Name: Date of Service: ?[name], [name] 08/02/2021 8:45 A M ?Medical Record Number: [id number] ?Patient Account Number: [id number] ?Date of Birth/Sex: Treating RN: ?[birth year] (51 y.o. F) Deaton, Bobbi ?Primary Care Henny Strauch: [name] Other Clinician: ?Referring Adedamola Seto: ?Treating Janaria Mccammon/Extender: [name] ?[name] ?Weeks in Treatment: 1 ?Compression Therapy Performed for Wound  Assessment: Wound #7 Left,Proximal,Anterior Lower Leg ?Performed By: Clinician Deon Pilling, RN ?Compression Type: Three Layer ?Post Procedure Diagnosis ?Same as Pre-procedure ?Electronic Signature(s) ?Signed: 08/02/2021 5:34:01 PM By: Deon Pilling RN, BSN ?Entered By: Deon Pilling on 08/02/2021 08:54:42 ?-------------------------------------------------------------------------------- ?Compression Therapy Details ?Patient Name: ?Date of Service: ?[name], [name] 08/02/2021 8:45 A M ?Medical Record Number: [id number] ?Patient Account Number: [id number] ?Date of Birth/Sex: ?Treating RN: ?07-15-1970 (51 y.o. F) Deaton, Bobbi ?Primary Care Shanell Aden: [name] ?Other Clinician: ?Referring Klyn Kroening: ?Treating Reyes Aldaco/Extender: [name] ?[name] ?Weeks in Treatment: 1 ?Compression Therapy Performed for Wound Assessment: Wound #8 Left,Distal,Anterior Lower Leg ?Performed By: Clinician Deon Pilling, RN ?Compression Type: Three Layer ?Post Procedure Diagnosis ?Same as Pre-procedure ?Electronic Signature(s) ?Signed: 08/02/2021 5:34:01 PM By: Deon Pilling RN, BSN ?Entered By: Deon Pilling on 08/02/2021 08:54:42 ?-------------------------------------------------------------------------------- ?Encounter Discharge Information Details ?Patient Name: ?Date of Service: ?[name], [name] 08/02/2021 8:45 A M ?Medical Record Number: [id number] ?Patient Account Number: [id number] ?Date of Birth/Sex: ?Treating RN: ?[birth year] (51 y.o. F) Deaton, Bobbi ?Primary Care Carnetta Losada: [name] ?Other Clinician: ?Referring Bijan Ridgley: ?Treating Welles Walthall/Extender: [name] ?[name] ?Weeks in Treatment: 1 ?Encounter Discharge Information Items Post Procedure Vitals ?Discharge Condition: Stable ?Temperature (F): 98.4 ?Ambulatory Status: Ambulatory ?Pulse (bpm): 86 ?Discharge Destination: Home ?Respiratory Rate (breaths/min): 20 ?Transportation: Private Auto ?Blood Pressure (mmHg): 135/77 ?Accompanied  By: self ?Schedule Follow-up Appointment: Yes ?Clinical Summary of Care: ?Electronic Signature(s) ?Signed: 08/02/2021 5:34:01 PM By: Deon Pilling RN, BSN ?Entered By: Deon Pilling on 08/02/2021 09:07:47 ?-------------------------------------------------------------------------------- ?Lower Extremity Assessment Details ?Patient Name: ?Date of Service: ?[name], [name] 08/02/2021 8:45 A M ?Medical Record Number: [id number] ?Patient Account Number: [id number] ?Date of Birth/Sex: ?Treating RN: ?1971-04-14 (51 y.o. F) Deaton, Bobbi ?Primary Care Suellen Durocher: [name] ?Other Clinician: ?Referring Amante Fomby: ?Treating Westlee Devita/Extender: [name] ?[name] ?Weeks in Treatment: 1 ?Edema Assessment ?Assessed: [Left: Yes] [Right: No] ?Edema: [Left: Ye] [Right: s] ?Calf ?Left: Right: ?Point of Measurement: 29 cm  From Medial Instep 51 cm ?Ankle ?Left: Right: ?Point of Measurement: 10 cm From Medial Instep 23 cm ?Knee To Floor ?Left: Right: ?From Medial Instep 40 cm ?Vascular Assessment ?Pulses: ?Dorsalis Pedis ?Palpable: [Left:Yes] ?Electronic Signature(s) ?Signed: 08/02/2021 5:34:01 PM By: Deon Pilling RN, BSN ?Entered By: Deon Pilling on 08/02/2021 09:00:15 ?-------------------------------------------------------------------------------- ?Multi Wound Chart Details ?Patient Name: ?Date of Service: ?[name], [name] 08/02/2021 8:45 A M ?Medical Record Number: [id number] ?Patient Account Number: [id number] ?Date of Birth/Sex: ?Treating RN: ?1970-09-06 (51 y.o. [name] ?Primary Care Gregary Blackard: [name] ?Other Clinician: ?Referring Isidora Laham: ?Treating Juvon Teater/Extender: [name] ?[name] ?Weeks in Treatment: 1 ?Vital Signs ?Height(in): ?Capillary Blood Glucose(mg/dl): 174 ?Weight(lbs): ?Pulse(bpm): 86 ?Body Mass Index(BMI): ?Blood Pressure(mmHg): 135/77 ?Temperature(??F): 98.4 ?Respiratory Rate(breaths/min): 20 ?Photos: [N/A:N/A] ?Left, Proximal, Anterior Lower Leg Left, Distal,  Anterior Lower Leg N/A ?Wound Location: ?Gradually Appeared Gradually Appeared N/A ?Wounding Event: ?Venous Leg Ulcer Venous Leg Ulcer N/A ?Primary Etiology: ?Lymphedema, Sleep Apnea, Lymphedema, Sleep Apnea, N/A ?Comorbid History: ?Congestive Heart Failure, Coronary Congestive Heart Failure, Coronary ?Artery Disease, Hypertension Artery Disease, Hypertension ?06/26/2021 06/26/2021 N/A ?Date Acquired: ?1 1 N/A ?Weeks of Treatment: ?Open Open N/A ?Wound Status: ?No No N/A ?Wound Recurrence: ?1.3x1.2x0.1 0.9x0.3x0.1 N/A ?Measurements L x W x D (cm) ?1.225 0.212 N/A ?A (cm?) : ?rea ?0.123 0.021 N/A ?Volume (cm?) : ?44.30% 88.80% N/A ?% Reduction in Area: ?44.10% 88.80% N/A ?% Reduction in Volume: ?Full Thickness Without Exposed Full Thickness Without Exposed N/A ?Classification: ?Support Structures Support Structures ?Medium Medium N/A ?Exudate Amount: ?Serosanguineous Serosanguineous N/A ?Exudate Type: ?red, brown red, brown N/A ?Exudate Color: ?Distinct, outline attached Distinct, outline attached N/A ?Wound Margin: ?Small (1-33%) Large (67-100%) N/A ?Granulation Amount: ?Red Red N/A ?Granulation Quality: ?Large (67-100%) None Present (0%) N/A ?Necrotic Amount: ?Fat Layer (Subcutaneous Tissue): Yes Fat Layer (Subcutaneous Tissue): Yes N/A ?Exposed Structures: ?Fascia: No ?Fascia: No ?Tendon: No ?Tendon: No ?Muscle: No ?Muscle: No ?Joint: No ?Joint: No ?Bone: No ?Bone: No ?Small (1-33%) None N/A ?Epithelialization: ?Debridement - Excisional Debridement - Excisional N/A ?Debridement: ?Pre-procedure Verification/Time Out 08:55 08:55 N/A ?Taken: ?Lidocaine 5% topical ointment Lidocaine 5% topical ointment N/A ?Pain Control: ?Subcutaneous, Slough Subcutaneous, Slough N/A ?Tissue Debrided: ?Skin/Subcutaneous Tissue Skin/Subcutaneous Tissue N/A ?Level: ?1.56 0.27 N/A ?Debridement A (sq cm): ?rea ?Curette Curette N/A ?Instrument: ?Minimum Minimum N/A ?Bleeding: ?Pressure Pressure N/A ?Hemostasis A chieved: ?0 0 N/A ?Procedural  Pain: ?0 0 N/A ?Post Procedural Pain: ?Procedure was tolerated well Procedure was tolerated well N/A ?Debridement Treatment Response: ?1.3x1.2x0.1 0.9x0.3x0.1 N/A ?Post Debridement Measurements L x ?W x D

## 2021-08-03 ENCOUNTER — Other Ambulatory Visit: Payer: Self-pay | Admitting: Internal Medicine

## 2021-08-03 DIAGNOSIS — I5032 Chronic diastolic (congestive) heart failure: Secondary | ICD-10-CM

## 2021-08-03 NOTE — Telephone Encounter (Signed)
Message left to schedule appt so we can fill medication. ?

## 2021-08-03 NOTE — Telephone Encounter (Signed)
Apt scheduled 11/16/21 ?

## 2021-08-03 NOTE — Telephone Encounter (Signed)
Appt scheduled. ?Requested Prescriptions  ?Pending Prescriptions Disp Refills  ?? spironolactone (ALDACTONE) 25 MG tablet [Pharmacy Med Name: SPIRONOLACTONE 25 MG TABLET] 30 tablet 2  ?  Sig: TAKE ONE TABLET BY MOUTH DAILY  ?  ? Cardiovascular: Diuretics - Aldosterone Antagonist Failed - 08/03/2021  5:03 PM  ?  ?  Failed - Na in normal range and within 180 days  ?  Sodium  ?Date Value Ref Range Status  ?06/01/2021 131 (L) 135 - 145 mmol/L Final  ?11/29/2020 138 134 - 144 mmol/L Final  ?   ?  ?  Passed - Cr in normal range and within 180 days  ?  Creatinine  ?Date Value Ref Range Status  ?02/22/2021 0.98 0.44 - 1.00 mg/dL Final  ? ?Creatinine, Ser  ?Date Value Ref Range Status  ?06/01/2021 0.97 0.44 - 1.00 mg/dL Final  ?   ?  ?  Passed - K in normal range and within 180 days  ?  Potassium  ?Date Value Ref Range Status  ?06/01/2021 3.6 3.5 - 5.1 mmol/L Final  ?   ?  ?  Passed - eGFR is 30 or above and within 180 days  ?  GFR calc Af Amer  ?Date Value Ref Range Status  ?01/20/2020 >60 >60 mL/min Final  ? ?GFR, Estimated  ?Date Value Ref Range Status  ?06/01/2021 >60 >60 mL/min Final  ?  Comment:  ?  (NOTE) ?Calculated using the CKD-EPI Creatinine Equation (2021) ?  ?02/22/2021 >60 >60 mL/min Final  ?  Comment:  ?  (NOTE) ?Calculated using the CKD-EPI Creatinine Equation (2021) ?  ? ?eGFR  ?Date Value Ref Range Status  ?11/29/2020 93 >59 mL/min/1.73 Final  ?   ?  ?  Passed - Last BP in normal range  ?  BP Readings from Last 1 Encounters:  ?07/11/21 122/70  ?   ?  ?  Passed - Valid encounter within last 6 months  ?  Recent Outpatient Visits   ?      ? 3 months ago Chronic bilateral thoracic back pain  ? [facility] Care at [facility], Kriste Basque, NP  ? 5 months ago Iron deficiency anemia due to chronic blood loss  ? [place] Ladell Pier, MD  ? 7 months ago Preoperative evaluation to rule out surgical contraindication  ? [place] Ladell Pier, MD  ? 8 months ago Type 2 diabetes mellitus with hyperglycemia, unspecified whether long term insulin use ([place])  ? [place] Johnston, [place], [place]  ? 1 year ago Chronic midline thoracic back pain  ? [place] Ladell Pier, MD  ?  ?  ?Future Appointments   ?        ? In 4 weeks Persons, Bevely Palmer, [place]  ? In 1 month Monge, Helane Gunther, NP [facility], [facility]  ? In 3 months Ladell Pier, MD [place]  ?  ? ?  ?  ?  ? ? ?

## 2021-08-07 ENCOUNTER — Ambulatory Visit: Payer: Medicaid Other | Admitting: Physical Therapy

## 2021-08-07 ENCOUNTER — Encounter: Payer: Self-pay | Admitting: Physical Therapy

## 2021-08-07 DIAGNOSIS — M546 Pain in thoracic spine: Secondary | ICD-10-CM

## 2021-08-07 DIAGNOSIS — M6281 Muscle weakness (generalized): Secondary | ICD-10-CM

## 2021-08-07 DIAGNOSIS — M545 Low back pain, unspecified: Secondary | ICD-10-CM | POA: Diagnosis not present

## 2021-08-07 DIAGNOSIS — G8929 Other chronic pain: Secondary | ICD-10-CM

## 2021-08-07 NOTE — Therapy (Signed)
?OUTPATIENT PHYSICAL THERAPY TREATMENT NOTE ? ? ?Patient Name: [name] ?MRN: [id number] ?DOB:1970/06/29, 51 y.o., female ?Today's Date: 08/07/2021 ? ?PCP: Ladell Pier, MD ?REFERRING PROVIDER: Ladell Pier, MD ? ? PT End of Session - 08/07/21 1215   ? ? Visit Number 3   ? Date for PT Re-Evaluation 09/14/21   ? Authorization Type [employer] MCD   ? PT Start Time 1215   ? PT Stop Time 8469   ? PT Time Calculation (min) 42 min   ? ?  ?  ? ?  ? ? ?Past Medical History:  ?Diagnosis Date  ? (HFpEF) heart failure with preserved ejection fraction (HCC) -> although echo suggests normal diastolic parameters with normal left atrial size 04/13/2020  ? Cellulitis   ? Coronary artery disease involving native coronary artery without angina pectoris   ? PT STATES - HAD A CARDIAC CATH - NOT TOLD SHE HAD CAD -> week note from [place] indicates history of MI (patient cannot corroborate  ? Diabetes mellitus without complication ([place])   ? DVT (deep venous thrombosis) ([place]) 09/17/2017  ? 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 Generalized anxiety disorder   ? H/O gastric ulcer 11/16/2018  ? History of small bowel obstruction   ? In childhood  ? Hypertension   ? Iron deficiency anemia due to chronic blood loss   ? Previously been followed by hematology for iron infusion every 2 weeks and as of 2019; full GI evaluation including capsule endoscopy negative.  ? Morbid obesity due to excess calories ([place])   ? OSA (obstructive sleep apnea) 07/11/2021  ? Sleep study April 06, 2019 (Dr. Halford Chessman): Moderate OSA (AHI of 24.4 and SPO2 low of 77%).  Majority events during REM sleep.-recommend CPAP, oral appliance or surgical. Reviewed sleep hygiene.  Avoid sedatives.    ? Osteoarthritis of left knee   ? Prediabetes   ? Small bowel obstruction ([place])   ? as a child  ? Speech impediment   ? Stutter / stammer  ? ?Past Surgical History:  ?Procedure Laterality Date  ? ABDOMINAL WALL DEFECT REPAIR  1970  ? IR CV  LINE INJECTION  10/24/2020  ? IR REMOVAL TUN ACCESS W/ PORT W/O FL MOD SED  05/28/2021  ? IVC FILTER INSERTION  2017  ? Lower Extremity Venous Duplex  06/23/2020  ? No evidence of DVT or superficial thrombosis bilaterally.  No evidence of deep venous insufficiency bilaterally.  No evidence of SSV reflux.  Right GSV in the calf has reflux, no reflux in L GSV.;  Repeated in July 2020-no DVT  ? OOPHORECTOMY  1996  ? OOPHORECTOMY  1997  ? PORTACATH PLACEMENT  2014  ? TRANSTHORACIC ECHOCARDIOGRAM  11/25/2020  ? EF 55 to 60%.  Normal LV function.  No RWM A.  Mild LVH.  GR 1 DD.  Normal PAP.  Normal aortic and mitral valve.  Mildly elevated RAP  ~8 mmHg.  ? ?Patient Active Problem List  ? Diagnosis Date Noted  ? OSA (obstructive sleep apnea) 07/11/2021  ? Depression 05/28/2021  ? Encounter for care related to Port-a-Cath 05/27/2021  ? Acute on chronic respiratory failure with hypoxia ([place]) 05/26/2021  ? Morbid obesity with BMI of 50.0-59.9, adult ([place]) 05/26/2021  ? Acute respiratory failure ([place]) 11/23/2020  ? Acute respiratory failure with hypoxia ([place]) 11/22/2020  ? Fever 11/22/2020  ? Bilateral lower extremity edema 11/22/2020  ? History of DVT (deep vein thrombosis) 11/22/2020  ? Asthma, chronic, moderate persistent,  with acute exacerbation 11/22/2020  ? Lower limb ulcer, calf, left, limited to breakdown of skin ([place]) 06/09/2020  ? Lymphedema 06/09/2020  ? Iron deficiency anemia due to chronic blood loss 05/10/2020  ? 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 Venous insufficiency 04/13/2020  ? S/P insertion of IVC (inferior vena caval) filter 04/13/2020  ? Chronic anemia 04/13/2020  ? Essential hypertension 04/13/2020  ? (HFpEF) heart failure with preserved ejection fraction (HCC) -> although echo suggests normal diastolic parameters with normal left atrial size 04/13/2020  ? CAD (coronary artery disease) 11/17/2019  ? ? ?THERAPY DIAG:  ?Chronic low back pain,  unspecified back pain laterality, unspecified whether sciatica present ? ?Pain in thoracic spine ? ?Muscle weakness ? ?REFERRING DIAG: Referral diagnosis: Acute right-sided low back pain with sciatica, sciatica laterality unspecified [M54.40] ? ?PERTINENT HISTORY: DVT (L calf, chronic), long term use of blood thinners, Asthma, morbid obesity, lymphedema, non healing wound L leg, DM TII, CHF (monitor during supine activities) ? ?PRECAUTIONS/RESTRICTIONS:  ? ?Fall ? ?SUBJECTIVE:  ?Pt reports that she feeling a little better today. ? ?Pain:  ?Are you having pain? Yes ?Pain location: upper tx to lower lumbar spine ?NPRS scale:  ?current 6/10  ?Aggravating factors: movement ?Relieving factors: sitting down (helps for short periods)       ?Pain description: constant, sharp, and aching ?Stage: Chronic ? ?OBJECTIVE: (taken on eval unless noted) ? ?MUSCLE LENGTH: ?Hamstrings: Right moderate restriction; Left no restriction ?ASLR: Right ASLR significantly reduced compared to PSLR ; Left ASLR significantly reduced compared to PSLR  ?  ?  ?LUMBAR AROM ?  ?AROM AROM  ?07/20/2021  ?Flexion WNL, w/ concordant pain  ?Extension limited by 25%, w/ concordant pain  ?Right lateral flexion limited by 25%, w/ concordant pain  ?Left lateral flexion limited by 50%, w/ concordant pain  ?Right rotation limited by 25%, w/ concordant pain  ?Left rotation limited by 25%, w/ concordant pain  ?  ?(Blank rows = not tested) ?  ?  ?LE MMT: ?  ?MMT Right ?07/20/2021 Left ?07/20/2021  ?Hip flexion (L2, L3)      ?Knee extension (L3)      ?Knee flexion      ?Hip abduction      ?Hip extension      ?Hip external rotation      ?Hip internal rotation      ?Hip adduction      ?Ankle dorsiflexion (L4)      ?Ankle plantarflexion (S1)      ?Ankle inversion      ?Ankle eversion      ?Great Toe ext (L5)      ?Grossly 3+ 3+  ?  ?(Blank rows = not tested, score listed is out of 5 possible points.  N = WNL, D = diminished, C = clear for gross weakness with myotome  testing, * = concordant pain with testing) ?  ?Functional Tests ?  ?Eval (07/20/2021)      ?Sustained supine bridge (dominant leg extended at 120'', if reached): 0'' (norm 170'')      ?49'' STS: 9x  UE used? Y      ?       ?       ?       ?       ?       ?       ?       ?       ?       ?       ?       ?       ? ? ?  ASTERISK SIGNS ?  ?  ?Asterisk Signs Eval (07/20/2021)  4/11          ?R HS length Mod limitation            ?ASLR vs PSLR Significant deficit            ?pain 10/10  6/10          ?STS 9x w/ UE            ?Bridge endurance unable  very small ROM          ?  ?  ?HOME EXERCISE PROGRAM: ?Access Code: [email or web address] ?URL: [email or web address] ?Date: 07/20/2021 ?Prepared by: Shearon Balo ?  ?Exercises ?- Seated Hip Abduction with Pelvic Floor Contraction and Resistance Loop  - 1 x daily - 7 x weekly - 3 sets - 10 reps ?- Seated Long Arc Quad  - 1 x daily - 7 x weekly - 3 sets - 10 reps ?- Seated Isometric Hip Adduction with Pelvic Floor Contraction  - 1 x daily - 7 x weekly - 3 sets - 10 reps ?- Seated March with Resistance  - 1 x daily - 7 x weekly - 3 sets - 10 reps ? ?TREATMENT 4/11: ? ?Therapeutic Exercise: ?- nu-step L5 67m while taking subjective and planning session with patient ?- LTR - 20x ?- L and R HS stretch - 60''x3 ?- SLR from foam roller - partial ROM - 2x7 ea ?- bridge - partial ROM 3x5  ?- STS - 2x10 ?- seated row - GTB - 2x10 ?- seated shoulder ext - GTB- 2x10 ?- lumbar ext row (next visit) ? ?TREATMENT 4/5: ? ?Therapeutic Exercise: ?- nu-step L5 54m while taking subjective and planning session with patient ?- LTR - 20x ?- R HS stretch - 30''x3 ?- SLR from foam roller - partial ROM - x10 ea ?- bridge - partial ROM 2x5  ?- STS - 2x7 ?- standing row - GTB - 2x10 ?- standing shoulder ext - GTB- x10 ?- lumbar ext row (next visit) ?  ?ASSESSMENT: ?  ?CLINICAL IMPRESSION: ?[name] is doing fair with therapy.  She presents with lower baseline pain today and is slowly increasing ability to  complete exercises.  She is still significantly limited by pain and endurance. ?  ?OBJECTIVE IMPAIRMENTS: Pain, lumbar ROM, LE MMT ?  ?ACTIVITY LIMITATIONS: bending, squatting, lifting, standing, walking ?  ?P

## 2021-08-09 ENCOUNTER — Encounter (HOSPITAL_BASED_OUTPATIENT_CLINIC_OR_DEPARTMENT_OTHER): Payer: Medicaid Other | Admitting: Internal Medicine

## 2021-08-09 ENCOUNTER — Ambulatory Visit: Payer: Medicaid Other | Admitting: Physical Therapy

## 2021-08-09 DIAGNOSIS — I87312 Chronic venous hypertension (idiopathic) with ulcer of left lower extremity: Secondary | ICD-10-CM | POA: Diagnosis not present

## 2021-08-09 DIAGNOSIS — I890 Lymphedema, not elsewhere classified: Secondary | ICD-10-CM | POA: Diagnosis not present

## 2021-08-09 DIAGNOSIS — L97822 Non-pressure chronic ulcer of other part of left lower leg with fat layer exposed: Secondary | ICD-10-CM | POA: Diagnosis not present

## 2021-08-09 NOTE — Progress Notes (Signed)
[name], [name] ([id number]) ?Visit Report for 08/09/2021 ?HPI Details ?Patient Name: Date of Service: ?[name], [name] 08/09/2021 8:45 A M ?Medical Record Number: [id number] ?Patient Account Number: [id number] ?Date of Birth/Sex: Treating RN: ?15-Jun-1970 (51 y.o. [name] ?Primary Care Provider: [name] Other Clinician: ?Referring Provider: ?Treating Provider/Extender: [name] ?[name] ?Weeks in Treatment: 2 ?History of Present Illness ?HPI Description: Admission 6/24 ?[name] is a 51 year old female with a past medical history of chronic venous insufficiency, lymphedema, DVT on anticoagulation and diastolic ?heart failure that presents to the clinic for left lower extremity wound. She was last seen 4 months ago in our clinic for the same issue. The reoccurring wound ?started at the end of May and has been going on for 1 month. She has been using an ointment and I am unclear what this is. She tries to keep her leg elevated ?with her compression stocking. She also reports she has lymphedema pumps and has been using them as well. ?She reports mild pain to the area. She denies signs of infection including increased warmth, erythema or purulent drainage. ?7/1; patient presents for 1 week follow-up. She has tolerated the wrap well. Unfortunately she did have trouble with the wrap Sliding down her leg 2 days ago. ?She has no issues or complaints today. She denies signs of infection. ?7/15; patient presents for follow-up. She has tolerated the wrap well. She has no issues or complaints today. She denies signs of infection. ?7/25; patient presents for 1 week follow-up. She has no issues or complaints today. She denies signs of infection. ?8/4; patient presents for follow-up. She has tolerated the compression wrap well to her left lower extremity. She states she was in the hospital last week due to ?fluid overload. She subsequently developed blisters to her bottom from being  in the hospital bed for prolonged periods of time. These have since ruptured. She ?now has 2 areas of skin breakdown. She has been keeping gauze on them. She denies signs of infection. ?8/18; patient presents for 2-week follow-up. She reports improvement to her buttocks wounds. She is able to tolerate the wrap well to her left lower extremity ?with no issues. She denies signs of infection. ?9/1; patient presents for 2-week follow-up. She has no issues or complaints today. she denies signs of infection. ?9/15; patient presents for 2-week follow-up. She has no issues or complaints today. She has tolerated the wrap well. She denies signs of infection. ?9/27; patient presents for 1 week follow-up. She has no issues or complaints today. ?10/20; patient presents for follow-up. She was on vacation for the past 3 weeks. She states she had the last clinic visit wrap in place for 1 week and then she ?took this off. She states that she has been using her compression wraps since. She states that the wound has gotten larger. She reports pain to the wound ?site. She denies fever/chills, nausea/vomiting. She denies purulent drainage. ?10/24; patient presents for follow-up. She states she tolerated the Kerlix/Coban wrap well. She reports decrease in pain. She states she started Keflex. She ?denies systemic signs of infection. ?10/28; patient presents for follow-up. She has no issues or complaints today. She denies acute pain. She tolerated the wrap well. ?11/4; patient presents for follow-up. She has no issues or complaints. She denies signs of infection. ?11/11; patient presents for follow-up. She has no issues or complaints today. She denies signs of infection. She tolerated the wrap well. ?11/18; patient presents for follow-up. She states  she took a shower however despite a plastic bag the wrap got wet. She currently denies signs of infection. ?12/2; patient presents for follow-up. She tolerated the wrap well. She has no issues  or complaints today. ?12/12; patient presents for follow-up. She has no issues or complaints today. ?Readmission 07/23/2021 ?[name] is a 51 year old female with a past medical history of chronic venous insufficiency that presents to the clinic for a left lower extremity ?wound. She was seen previously in the clinic for the same wound and this was healed on 04/09/2021. Since then she has been wearing her compression ?stockings intermittently. She reports more swelling to her left lower extremity over the past month and she stopped wearing her compression stocking. ?Subsequently she developed a wound. Her Lasix has been increased and she has noticed an improvement in her swelling. She currently denies signs of ?infection. ?4/6; patient presents for follow-up. She states that the wrap stayed in place for 2 days before it rolled down and she had to take it off. She has compression ?stockings however states she could not get these on due to the swelling. She denies signs of infection. ?4/13; patient with chronic venous insufficiency that has been in this clinic previously. She has a small pair of open areas on the left lower leg in the middle of ?previous scar tissue from I believe a previous wound. Most concerning today is how poor her edema control is above the sclerotic skin in her lower extremity. ?This is nonpitting and compatible with lymphedema. She tells me that she was using a juxta lite stocking although it was old we are apparently replacing this. ?She does not seem aware of how to adjust the tension with this and I think she is going to require 30/40 mmHg equivalent compression ?She has her arterial studies tomorrow at 2:00 ?Electronic Signature(s) ?Signed: 08/09/2021 4:28:39 PM By: Megan Ham MD ?Entered By: [name] on 08/09/2021 09:08:13 ?-------------------------------------------------------------------------------- ?Physical Exam Details ?Patient Name: Date of Service: ?[name], [name] 08/09/2021 8:45 A M ?Medical Record Number: [id number] ?Patient Account Number: [id number] ?Date of Birth/Sex: Treating RN: ?07-Jul-1970 (51 y.o. [name] ?Primary Care Provider: [name] Other Clinician: ?Referring Provider: ?Treating Provider/Extender: [name] ?[name] ?Weeks in Treatment: 2 ?Constitutional ?Sitting or standing Blood Pressure is within target range for patient.. Pulse regular and within target range for patient.[name] Respirations regular, non-labored and ?within target range.. Temperature is normal and within the target range for the patient.[name] Appears in no distress. ?Cardiovascular ?Pedal pulses are easily palpable at the dorsalis pedis and posterior tibia. Foot is warm. Very poor edema control in the left leg especially in the proximal two ?thirds. Nonpitting compatible with lymphedema. Distal one third lipo dermatosclerosis. ?Electronic Signature(s) ?Signed: 08/09/2021 4:28:39 PM By: Megan Ham MD ?Entered By: [name] on 08/09/2021 09:10:51 ?-------------------------------------------------------------------------------- ?Physician Orders Details ?Patient Name: Date of Service: ?JOSHUA, ZERINGUE D. 08/09/2021 8:45 A M ?Medical Record Number: [id number] ?Patient Account Number: [id number] ?Date of Birth/Sex: Treating RN: ?1971-02-15 (51 y.o. [name] ?Primary Care Provider: [name] Other Clinician: ?Referring Provider: ?Treating Provider/Extender: [name] ?[name] ?Weeks in Treatment: 2 ?Verbal / Phone Orders: No ?Diagnosis Coding ?Follow-up Appointments ?ppointment in 2 weeks. - Tuesday 08/21/21 with Dr. Heber [place] Leveda Anna, Room 7) ?Return A ?Nurse Visit: - Nurse visit Monday to re-apply wrap after arterial studies ([place] 14:30) ?Other: - patient to purchase juxtalite HD from [facility]- bring in weekly to wound center. ?Ensure to go for  arterial [facility] scheduled for 08/10/2021. ?Bathing/ Shower/  Hygiene ?May shower with protection but do not get wound dressing(s) wet. - May use cast protector bag from [facility], [facility] or [place] ?Edema Control - Lymphedema / SCD / Other ?Lymphedema Pumps. Use Lymphedem

## 2021-08-10 ENCOUNTER — Other Ambulatory Visit (HOSPITAL_COMMUNITY): Payer: Self-pay | Admitting: Internal Medicine

## 2021-08-10 ENCOUNTER — Ambulatory Visit (HOSPITAL_COMMUNITY)
Admission: RE | Admit: 2021-08-10 | Discharge: 2021-08-10 | Disposition: A | Discharge status: Home / Self Care | Payer: Medicaid Other | Source: Ambulatory Visit | Attending: Internal Medicine | Admitting: Internal Medicine

## 2021-08-10 DIAGNOSIS — E1151 Type 2 diabetes mellitus with diabetic peripheral angiopathy without gangrene: Secondary | ICD-10-CM

## 2021-08-10 NOTE — Progress Notes (Signed)
BIANCO, CANGE D. ([id number]) ?Visit Report for 08/09/2021 ?Arrival Information Details ?Patient Name: Date of Service: ?[name], [name] 08/09/2021 8:45 A M ?Medical Record Number: [id number] ?Patient Account Number: [id number] ?Date of Birth/Sex: Treating RN: ?1970-11-18 (51 y.o. F) Deaton, Bobbi ?Primary Care Weylin Plagge: [name] Other Clinician: ?Referring Emalie Mcwethy: ?Treating Dat Derksen/Extender: Linton Ham ?[name] ?Weeks in Treatment: 2 ?Visit Information History Since Last Visit ?Added or deleted any medications: No ?Patient Arrived: Ambulatory ?Any new allergies or adverse reactions: No ?Arrival Time: 08:34 ?Had a fall or experienced change in No ?Accompanied By: self ?activities of daily living that may affect ?Transfer Assistance: None ?risk of falls: ?Patient Identification Verified: Yes ?Signs or symptoms of abuse/neglect since last visito No ?Secondary Verification Process Completed: Yes ?Hospitalized since last visit: No ?Patient Requires Transmission-Based Precautions: No ?Implantable device outside of the clinic excluding No ?Patient Has Alerts: Yes ?cellular tissue based products placed in the center ?Patient Alerts: Patient on Blood Thinner since last visit: ?L ABI=[place] Has Dressing in Place as Prescribed: Yes ?Has Compression in Place as Prescribed: Yes ?Pain Present Now: No ?Electronic Signature(s) ?Signed: 08/10/2021 1:08:25 PM By: Deon Pilling RN, BSN ?Entered By: Deon Pilling on 08/09/2021 08:35:17 ?-------------------------------------------------------------------------------- ?Compression Therapy Details ?Patient Name: Date of Service: ?[name], [name] 08/09/2021 8:45 A M ?Medical Record Number: [id number] ?Patient Account Number: [id number] ?Date of Birth/Sex: Treating RN: ?April 10, 1971 (51 y.o. [name] ?Primary Care Bradlee Bridgers: [name] Other Clinician: ?Referring Orlean Holtrop: ?Treating Dontavia Brand/Extender: Linton Ham ?[name] ?Weeks in Treatment:  2 ?Compression Therapy Performed for Wound Assessment: Wound #7 Left,Proximal,Anterior Lower Leg ?Performed By: Clinician [name], RN ?Compression Type: Four Layer ?Post Procedure Diagnosis ?Same as Pre-procedure ?Electronic Signature(s) ?Signed: 08/09/2021 2:31:44 PM By: [name] ?Entered By: [name] on 08/09/2021 08:52:54 ?-------------------------------------------------------------------------------- ?Compression Therapy Details ?Patient Name: ?Date of Service: ?[name], [name] 08/09/2021 8:45 A M ?Medical Record Number: [id number] ?Patient Account Number: [id number] ?Date of Birth/Sex: ?Treating RN: ?1970-05-17 (51 y.o. [name] ?Primary Care Patriciann Becht: [name] ?Other Clinician: ?Referring Jovonna Nickell: ?Treating Sriman Tally/Extender: Linton Ham ?[name] ?Weeks in Treatment: 2 ?Compression Therapy Performed for Wound Assessment: Wound #8 Left,Distal,Anterior Lower Leg ?Performed By: Clinician [name], RN ?Compression Type: Four Layer ?Post Procedure Diagnosis ?Same as Pre-procedure ?Electronic Signature(s) ?Signed: 08/09/2021 2:31:44 PM By: [name] ?Entered By: [name] on 08/09/2021 08:52:54 ?-------------------------------------------------------------------------------- ?Encounter Discharge Information Details ?Patient Name: ?Date of Service: ?[name], [name] 08/09/2021 8:45 A M ?Medical Record Number: [id number] ?Patient Account Number: [id number] ?Date of Birth/Sex: ?Treating RN: ?04/20/1971 (51 y.o. [name] ?Primary Care Roxi Hlavaty: [name] ?Other Clinician: ?Referring Kden Wagster: ?Treating Nandita Mathenia/Extender: Linton Ham ?[name] ?Weeks in Treatment: 2 ?Encounter Discharge Information Items ?Discharge Condition: Stable ?Ambulatory Status: Ambulatory ?Discharge Destination: Home ?Transportation: Private Auto ?Schedule Follow-up Appointment: Yes ?Clinical Summary of Care: Provided on 08/09/2021 ?Form Type  Recipient ?Paper Patient Patient ?Electronic Signature(s) ?Signed: 08/09/2021 9:25:25 AM By: [name] ?Entered By: [name] on 08/09/2021 09:25:24 ?-------------------------------------------------------------------------------- ?Lower Extremity Assessment Details ?Patient Name: ?Date of Service: ?[name], [name] 08/09/2021 8:45 A M ?Medical Record Number: [id number] ?Patient Account Number: [id number] ?Date of Birth/Sex: ?Treating RN: ?06/18/1970 (51 y.o. F) Deaton, Bobbi ?Primary Care Sahian Kerney: [name] ?Other Clinician: ?Referring Analysia Dungee: ?Treating Kayna Suppa/Extender: Linton Ham ?[name] ?Weeks in Treatment: 2 ?Edema Assessment ?Assessed: [Left: Yes] [Right: No] ?Edema: [Left: Ye] [Right: s] ?Calf ?Left: Right: ?Point of Measurement: 29 cm From Medial Instep 54.5 cm ?Ankle ?Left: Right: ?Point of  Measurement: 10 cm From Medial Instep 23 cm ?Vascular Assessment ?Pulses: ?Dorsalis Pedis ?Palpable: [Left:Yes] ?Electronic Signature(s) ?Signed: 08/10/2021 1:08:25 PM By: Deon Pilling RN, BSN ?Entered By: Deon Pilling on 08/09/2021 08:39:51 ?-------------------------------------------------------------------------------- ?Multi Wound Chart Details ?Patient Name: ?Date of Service: ?[name], [name] 08/09/2021 8:45 A M ?Medical Record Number: [id number] ?Patient Account Number: [id number] ?Date of Birth/Sex: ?Treating RN: ?01/28/1971 (51 y.o. [name] ?Primary Care Ember Henrikson: [name] ?Other Clinician: ?Referring Christoffer Currier: ?Treating Arnelle Nale/Extender: Linton Ham ?[name] ?Weeks in Treatment: 2 ?Vital Signs ?Height(in): ?Capillary Blood Glucose(mg/dl): 190 ?Weight(lbs): ?Pulse(bpm): 85 ?Body Mass Index(BMI): ?Blood Pressure(mmHg): 137/87 ?Temperature(??F): 98.4 ?Respiratory Rate(breaths/min): 20 ?Photos: [N/A:N/A] ?Left, Proximal, Anterior Lower Leg Left, Distal, Anterior Lower Leg N/A ?Wound Location: ?Gradually Appeared Gradually Appeared N/A ?Wounding  Event: ?Venous Leg Ulcer Venous Leg Ulcer N/A ?Primary Etiology: ?Lymphedema, Sleep Apnea, Lymphedema, Sleep Apnea, N/A ?Comorbid History: ?Congestive Heart Failure, Coronary Congestive Heart Failure, Coronary ?Artery Disease, Hypertension Artery Disease, Hypertension ?06/26/2021 06/26/2021 N/A ?Date Acquired: ?2 2 N/A ?Weeks of Treatment: ?Open Open N/A ?Wound Status: ?No No N/A ?Wound Recurrence: ?0.6x0.4x0.1 0.5x0.5x0.1 N/A ?Measurements L x W x D (cm) ?0.188 0.196 N/A ?A (cm?) : ?rea ?0.019 0.02 N/A ?Volume (cm?) : ?91.50% 89.60% N/A ?% Reduction in Area: ?91.40% 89.40% N/A ?% Reduction in Volume: ?Full Thickness Without Exposed Full Thickness Without Exposed N/A ?Classification: ?Support Structures Support Structures ?Medium None Present N/A ?Exudate A mount: ?Serosanguineous N/A N/A ?Exudate Type: ?red, brown N/A N/A ?Exudate Color: ?Distinct, outline attached Distinct, outline attached N/A ?Wound Margin: ?Large (67-100%) None Present (0%) N/A ?Granulation Amount: ?Red N/A N/A ?Granulation Quality: ?None Present (0%) Large (67-100%) N/A ?Necrotic Amount: ?N/A Eschar N/A ?Necrotic Tissue: ?Fat Layer (Subcutaneous Tissue): Yes Fat Layer (Subcutaneous Tissue): Yes N/A ?Exposed Structures: ?Fascia: No ?Fascia: No ?Tendon: No ?Tendon: No ?Muscle: No ?Muscle: No ?Joint: No ?Joint: No ?Bone: No ?Bone: No ?Large (67-100%) Large (67-100%) N/A ?Epithelialization: ?Compression Therapy Compression Therapy N/A ?Procedures Performed: ?Treatment Notes ?Electronic Signature(s) ?Signed: 08/09/2021 2:31:44 PM By: [name] ?Signed: 08/09/2021 4:28:39 PM By: Linton Ham MD ?Entered By: Linton Ham on 08/09/2021 09:06:35 ?-------------------------------------------------------------------------------- ?Multi-Disciplinary Care Plan Details ?Patient Name: ?Date of Service: ?[name], [name] 08/09/2021 8:45 A M ?Medical Record Number: [id number] ?Patient Account Number: [id number] ?Date of Birth/Sex: ?Treating  RN: ?05/26/1970 (51 y.o. [name] ?Primary Care Jaysin Gayler: [name] ?Other Clinician: ?Referring Eulalie Speights: ?Treating Rajeev Escue/Extender: Linton Ham ?[name] ?Weeks in Treatment: 2 ?Active Inactive ?

## 2021-08-13 ENCOUNTER — Encounter (HOSPITAL_BASED_OUTPATIENT_CLINIC_OR_DEPARTMENT_OTHER): Payer: Medicaid Other | Admitting: Internal Medicine

## 2021-08-13 DIAGNOSIS — L97822 Non-pressure chronic ulcer of other part of left lower leg with fat layer exposed: Secondary | ICD-10-CM | POA: Diagnosis not present

## 2021-08-13 NOTE — Progress Notes (Signed)
[name], [name] ([id number]) ?Visit Report for 08/13/2021 ?Arrival Information Details ?Patient Name: Date of Service: ?[name], [name] 08/13/2021 3:30 PM ?Medical Record Number: [id number] ?Patient Account Number: [id number] ?Date of Birth/Sex: Treating RN: ?01-04-1971 (51 y.o. F) Deaton, Bobbi ?Primary Care Jezabel Lecker: [name] Other Clinician: ?Referring Amaury Kuzel: ?Treating Izan Miron/Extender: Linton Ham ?[name] ?Weeks in Treatment: 3 ?Visit Information History Since Last Visit ?Added or deleted any medications: No ?Patient Arrived: Ambulatory ?Any new allergies or adverse reactions: No ?Arrival Time: 15:26 ?Had a fall or experienced change in No ?Accompanied By: self ?activities of daily living that may affect ?Transfer Assistance: None ?risk of falls: ?Patient Identification Verified: Yes ?Signs or symptoms of abuse/neglect since last visito No ?Secondary Verification Process Completed: Yes ?Hospitalized since last visit: No ?Patient Requires Transmission-Based Precautions: No ?Implantable device outside of the clinic excluding No ?Patient Has Alerts: Yes ?cellular tissue based products placed in the center ?Patient Alerts: Patient on Blood Thinner since last visit: ?L ABI=[place] Has Dressing in Place as Prescribed: Yes ?Has Compression in Place as Prescribed: Yes ?Pain Present Now: No ?Electronic Signature(s) ?Signed: 08/13/2021 4:07:13 PM By: Deon Pilling RN, BSN ?Entered By: Deon Pilling on 08/13/2021 15:30:59 ?-------------------------------------------------------------------------------- ?Compression Therapy Details ?Patient Name: Date of Service: ?[name], [name] 08/13/2021 3:30 PM ?Medical Record Number: [id number] ?Patient Account Number: [id number] ?Date of Birth/Sex: Treating RN: ?March 03, 1971 (51 y.o. F) Deaton, Bobbi ?Primary Care Lilliana Turner: [name] Other Clinician: ?Referring Jaxten Brosh: ?Treating Akeen Ledyard/Extender: Linton Ham ?[name] ?Weeks in Treatment:  3 ?Compression Therapy Performed for Wound Assessment: Wound #7 Left,Proximal,Anterior Lower Leg ?Performed By: Clinician Deon Pilling, RN ?Compression Type: Four Layer ?Electronic Signature(s) ?Signed: 08/13/2021 4:07:13 PM By: Deon Pilling RN, BSN ?Entered By: Deon Pilling on 08/13/2021 15:32:24 ?-------------------------------------------------------------------------------- ?Compression Therapy Details ?Patient Name: Date of Service: ?[name], [name] 08/13/2021 3:30 PM ?Medical Record Number: [id number] ?Patient Account Number: [id number] ?Date of Birth/Sex: ?Treating RN: ?12-Jan-1971 (51 y.o. F) Deaton, Bobbi ?Primary Care Maleta Pacha: [name] ?Other Clinician: ?Referring Kady Toothaker: ?Treating Syvilla Martin/Extender: Linton Ham ?[name] ?Weeks in Treatment: 3 ?Compression Therapy Performed for Wound Assessment: Wound #8 Left,Distal,Anterior Lower Leg ?Performed By: Clinician Deon Pilling, RN ?Compression Type: Four Layer ?Electronic Signature(s) ?Signed: 08/13/2021 4:07:13 PM By: Deon Pilling RN, BSN ?Entered By: Deon Pilling on 08/13/2021 15:32:24 ?-------------------------------------------------------------------------------- ?Encounter Discharge Information Details ?Patient Name: ?Date of Service: ?[name], [name] 08/13/2021 3:30 PM ?Medical Record Number: [id number] ?Patient Account Number: [id number] ?Date of Birth/Sex: ?Treating RN: ?February 14, 1971 (51 y.o. [name], [name] ?Primary Care Brixon Zhen: [name] ?Other Clinician: ?Referring Legion Discher: ?Treating Katalea Ucci/Extender: Linton Ham ?[name] ?Weeks in Treatment: 3 ?Encounter Discharge Information Items ?Discharge Condition: Stable ?Ambulatory Status: Ambulatory ?Discharge Destination: Home ?Transportation: Private Auto ?Accompanied By: slef ?Schedule Follow-up Appointment: Yes ?Clinical Summary of Care: Patient Declined ?Electronic Signature(s) ?Signed: 08/13/2021 3:52:22 PM By: Rhae Hammock RN ?Entered By:  Rhae Hammock on 08/13/2021 15:35:02 ?-------------------------------------------------------------------------------- ?Patient/Caregiver Education Details ?Patient Name: ?Date of Service: ?[name], SHAIN. 4/17/2023andnbsp3:30 PM ?Medical Record Number: [id number] ?Patient Account Number: [id number] ?Date of Birth/Gender: ?Treating RN: ?1970-11-22 (51 y.o. [name], [name] ?Primary Care Physician: [place], [place] ?Other Clinician: ?Referring Physician: ?Treating Physician/Extender: Linton Ham ?[name] ?Weeks in Treatment: 3 ?Education Assessment ?Education Provided To: ?Patient ?Education Topics Provided ?Wound/Skin Impairment: ?Methods: Explain/Verbal ?Responses: State content correctly ?Electronic Signature(s) ?Signed: 08/13/2021 3:52:22 PM By: Rhae Hammock RN ?Entered By: Rhae Hammock on 08/13/2021 15:34:38 ?-------------------------------------------------------------------------------- ?Wound Assessment Details ?Patient Name: ?Date of Service: ?[name], [name]  08/13/2021 3:30 PM ?Medical Record Number: [id number] ?Patient Account Number: [id number] ?Date of Birth/Sex: ?Treating RN: ?07-28-1970 (51 y.o. F) Deaton, Bobbi ?Primary Care Emran Molzahn: [name] ?Other Clinician: ?Referring Medford Staheli: ?Treating Jah Alarid/Extender: Linton Ham ?[name] ?Weeks in Treatment: 3 ?Wound Status ?Wound Number: 7 ?Primary Etiology: Venous Leg Ulcer ?Wound Location: Left, Proximal, Anterior Lower Leg ?Wound Status: Open ?Wounding Event: Gradually Appeared ?Date Acquired: 06/26/2021 ?Weeks Of Treatment: 3 ?Clustered Wound: No ?Wound Measurements ?Length: (cm) 0.6 ?Width: (cm) 0.4 ?Depth: (cm) 0.1 ?Area: (cm?) 0.188 ?Volume: (cm?) 0.019 ?% Reduction in Area: 91.5% ?% Reduction in Volume: 91.4% ?Wound Description ?Classification: Full Thickness Without Exposed Support Structu ?Exudate Amount: Medium ?Exudate Type: Serosanguineous ?Exudate Color: red, brown ?res ?Treatment  Notes ?Wound #7 (Lower Leg) Wound Laterality: Left, Anterior, Proximal ?Cleanser ?Soap and Water ?Discharge Instruction: May shower and wash wound with dial antibacterial soap and water prior to dressing change. ?Wound Cleanser ?Discharge Instruction: Cleanse the wound with wound cleanser prior to applying a clean dressing using gauze sponges, not tissue or cotton ?balls. ?Peri-Wound Care ?Triamcinolone 15 (g) ?Discharge Instruction: Use triamcinolone 15 (g) as directed ?Sween Lotion (Moisturizing lotion) ?Discharge Instruction: Apply moisturizing lotion as directed ?Topical ?Primary Dressing ?Promogran Prisma Matrix, 4.34 (sq in) (silver collagen) ?Discharge Instruction: Moisten collagen with saline or hydrogel ?Secondary Dressing ?Zetuvit Plus 4x8 in ?Discharge Instruction: Apply over primary dressing as directed. ?Secured With ?Compression Wrap ?FourPress (4 layer compression wrap) ?Discharge Instruction: Apply four layer compression as directed. May also use Miliken CoFlex 2 layer compression system as alternative. ?Compression Stockings ?Add-Ons ?Electronic Signature(s) ?Signed: 08/13/2021 4:07:13 PM By: Deon Pilling RN, BSN ?Entered By: Deon Pilling on 08/13/2021 15:31:43 ?-------------------------------------------------------------------------------- ?Wound Assessment Details ?Patient Name: ?Date of Service: ?[name], [name] 08/13/2021 3:30 PM ?Medical Record Number: [id number] ?Patient Account Number: [id number] ?Date of Birth/Sex: ?Treating RN: ?Sep 19, 1970 (51 y.o. F) Deaton, Bobbi ?Primary Care Keyoni Lapinski: [name] ?Other Clinician: ?Referring Teal Raben: ?Treating Tarry Fountain/Extender: Linton Ham ?[name] ?Weeks in Treatment: 3 ?Wound Status ?Wound Number: 8 ?Primary Etiology: Venous Leg Ulcer ?Wound Location: Left, Distal, Anterior Lower Leg ?Wound Status: Open ?Wounding Event: Gradually Appeared ?Date Acquired: 06/26/2021 ?Weeks Of Treatment: 3 ?Clustered Wound: No ?Wound  Measurements ?Length: (cm) 0.5 ?Width: (cm) 0.5 ?Depth: (cm) 0.1 ?Area: (cm?) 0.196 ?Volume: (cm?) 0.02 ?% Reduction in Area: 89.6% ?% Reduction in Volume: 89.4% ?Wound Description ?Classification: Full Thickness Without Exposed

## 2021-08-13 NOTE — Progress Notes (Signed)
SYNAI, PRETTYMAN D. ([id number]) ?Visit Report for 08/13/2021 ?SuperBill Details ?Patient Name: Date of Service: ?[name], [name] 08/13/2021 ?Medical Record Number: [id number] ?Patient Account Number: [id number] ?Date of Birth/Sex: Treating RN: ?Nov 06, 1970 (51 y.o. 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

## 2021-08-14 ENCOUNTER — Ambulatory Visit: Payer: Medicaid Other

## 2021-08-14 DIAGNOSIS — M546 Pain in thoracic spine: Secondary | ICD-10-CM

## 2021-08-14 DIAGNOSIS — G8929 Other chronic pain: Secondary | ICD-10-CM

## 2021-08-14 DIAGNOSIS — M6281 Muscle weakness (generalized): Secondary | ICD-10-CM

## 2021-08-14 DIAGNOSIS — M545 Low back pain, unspecified: Secondary | ICD-10-CM | POA: Diagnosis not present

## 2021-08-14 DIAGNOSIS — R2689 Other abnormalities of gait and mobility: Secondary | ICD-10-CM

## 2021-08-14 NOTE — Therapy (Signed)
?OUTPATIENT PHYSICAL THERAPY TREATMENT NOTE ? ? ?Patient Name: [name] ?MRN: [id number] ?DOB:Jun 09, 1970, 51 y.o., female ?Today's Date: 08/14/2021 ? ?PCP: Ladell Pier, MD ?REFERRING PROVIDER: Ladell Pier, MD ? ? PT End of Session - 08/14/21 1212   ? ? Visit Number 4   ? Date for PT Re-Evaluation 09/14/21   ? Authorization Type [employer] MCD   ? PT Start Time 1212   ? PT Stop Time 1250   ? PT Time Calculation (min) 38 min   ? Activity Tolerance Patient tolerated treatment well;Patient limited by pain;Patient limited by fatigue   ? Behavior During Therapy [facility] for tasks assessed/performed   ? ?  ?  ? ?  ? ? ? ?Past Medical History:  ?Diagnosis Date  ? (HFpEF) heart failure with preserved ejection fraction (HCC) -> although echo suggests normal diastolic parameters with normal left atrial size 04/13/2020  ? Cellulitis   ? Coronary artery disease involving native coronary artery without angina pectoris   ? PT STATES - HAD A CARDIAC CATH - NOT TOLD SHE HAD CAD -> week note from [place] indicates history of MI (patient cannot corroborate  ? Diabetes mellitus without complication ([place])   ? DVT (deep venous thrombosis) ([place]) 09/17/2017  ? 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 Generalized anxiety disorder   ? H/O gastric ulcer 11/16/2018  ? History of small bowel obstruction   ? In childhood  ? Hypertension   ? Iron deficiency anemia due to chronic blood loss   ? Previously been followed by hematology for iron infusion every 2 weeks and as of 2019; full GI evaluation including capsule endoscopy negative.  ? Morbid obesity due to excess calories ([place])   ? OSA (obstructive sleep apnea) 07/11/2021  ? Sleep study April 06, 2019 (Dr. Halford Chessman): Moderate OSA (AHI of 24.4 and SPO2 low of 77%).  Majority events during REM sleep.-recommend CPAP, oral appliance or surgical. Reviewed sleep hygiene.  Avoid sedatives.    ? Osteoarthritis of left knee   ? Prediabetes   ? Small bowel obstruction  ([place])   ? as a child  ? Speech impediment   ? Stutter / stammer  ? ?Past Surgical History:  ?Procedure Laterality Date  ? ABDOMINAL WALL DEFECT REPAIR  1970  ? IR CV LINE INJECTION  10/24/2020  ? IR REMOVAL TUN ACCESS W/ PORT W/O FL MOD SED  05/28/2021  ? IVC FILTER INSERTION  2017  ? Lower Extremity Venous Duplex  06/23/2020  ? No evidence of DVT or superficial thrombosis bilaterally.  No evidence of deep venous insufficiency bilaterally.  No evidence of SSV reflux.  Right GSV in the calf has reflux, no reflux in L GSV.;  Repeated in July 2020-no DVT  ? OOPHORECTOMY  1996  ? OOPHORECTOMY  1997  ? PORTACATH PLACEMENT  2014  ? TRANSTHORACIC ECHOCARDIOGRAM  11/25/2020  ? EF 55 to 60%.  Normal LV function.  No RWM A.  Mild LVH.  GR 1 DD.  Normal PAP.  Normal aortic and mitral valve.  Mildly elevated RAP  ~8 mmHg.  ? ?Patient Active Problem List  ? Diagnosis Date Noted  ? OSA (obstructive sleep apnea) 07/11/2021  ? Depression 05/28/2021  ? Encounter for care related to Port-a-Cath 05/27/2021  ? Acute on chronic respiratory failure with hypoxia ([place]) 05/26/2021  ? Morbid obesity with BMI of 50.0-59.9, adult ([place]) 05/26/2021  ? Acute respiratory failure ([place]) 11/23/2020  ? Acute respiratory failure with hypoxia ([place]) 11/22/2020  ?  Fever 11/22/2020  ? Bilateral lower extremity edema 11/22/2020  ? History of DVT (deep vein thrombosis) 11/22/2020  ? Asthma, chronic, moderate persistent, with acute exacerbation 11/22/2020  ? Lower limb ulcer, calf, left, limited to breakdown of skin ([place]) 06/09/2020  ? Lymphedema 06/09/2020  ? Iron deficiency anemia due to chronic blood loss 05/10/2020  ? 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 Venous insufficiency 04/13/2020  ? S/P insertion of IVC (inferior vena caval) filter 04/13/2020  ? Chronic anemia 04/13/2020  ? Essential hypertension 04/13/2020  ? (HFpEF) heart failure with preserved ejection fraction (HCC) -> although echo  suggests normal diastolic parameters with normal left atrial size 04/13/2020  ? CAD (coronary artery disease) 11/17/2019  ? ? ?THERAPY DIAG:  ?Chronic low back pain, unspecified back pain laterality, unspecified whether sciatica present ? ?Pain in thoracic spine ? ?Muscle weakness ? ?Other abnormalities of gait and mobility ? ?REFERRING DIAG: Referral diagnosis: Acute right-sided low back pain with sciatica, sciatica laterality unspecified [M54.40] ? ?PERTINENT HISTORY: DVT (L calf, chronic), long term use of blood thinners, Asthma, morbid obesity, lymphedema, non healing wound L leg, DM TII, CHF (monitor during supine activities) ? ?PRECAUTIONS/RESTRICTIONS:  ? ?Fall ? ?SUBJECTIVE: Patient reports she is feeling better ? ?Pain:  ?Are you having pain? Yes ?Pain location: upper tx to lower lumbar spine ?NPRS scale:  ?current 9/10  ?Aggravating factors: movement ?Relieving factors: sitting down (helps for short periods)       ?Pain description: constant, sharp, and aching ?Stage: Chronic ? ?OBJECTIVE: (taken on eval unless noted) ? ?MUSCLE LENGTH: ?Hamstrings: Right moderate restriction; Left no restriction ?ASLR: Right ASLR significantly reduced compared to PSLR ; Left ASLR significantly reduced compared to PSLR  ?  ?  ?LUMBAR AROM ?  ?AROM AROM  ?07/20/2021  ?Flexion WNL, w/ concordant pain  ?Extension limited by 25%, w/ concordant pain  ?Right lateral flexion limited by 25%, w/ concordant pain  ?Left lateral flexion limited by 50%, w/ concordant pain  ?Right rotation limited by 25%, w/ concordant pain  ?Left rotation limited by 25%, w/ concordant pain  ?  ?(Blank rows = not tested) ?  ?  ?LE MMT: ?  ?MMT Right ?07/20/2021 Left ?07/20/2021  ?Hip flexion (L2, L3)      ?Knee extension (L3)      ?Knee flexion      ?Hip abduction      ?Hip extension      ?Hip external rotation      ?Hip internal rotation      ?Hip adduction      ?Ankle dorsiflexion (L4)      ?Ankle plantarflexion (S1)      ?Ankle inversion      ?Ankle  eversion      ?Great Toe ext (L5)      ?Grossly 3+ 3+  ?  ?(Blank rows = not tested, score listed is out of 5 possible points.  N = WNL, D = diminished, C = clear for gross weakness with myotome testing, * = concordant pain with testing) ?  ?Functional Tests ?  ?Eval (07/20/2021)      ?Sustained supine bridge (dominant leg extended at 120'', if reached): 0'' (norm 170'')      ?58'' STS: 9x  UE used? Y      ?       ?       ?       ?       ?       ?       ?       ?       ?       ?       ?       ?       ? ? ?  ASTERISK SIGNS ?  ?  ?Asterisk Signs Eval (07/20/2021)  4/11          ?R HS length Mod limitation            ?ASLR vs PSLR Significant deficit            ?pain 10/10  6/10          ?STS 9x w/ UE            ?Bridge endurance unable  very small ROM          ?  ?  ?HOME EXERCISE PROGRAM: ?Access Code: [email or web address] ?URL: [email or web address] ?Date: 07/20/2021 ?Prepared by: Shearon Balo ?  ?Exercises ?- Seated Hip Abduction with Pelvic Floor Contraction and Resistance Loop  - 1 x daily - 7 x weekly - 3 sets - 10 reps ?- Seated Long Arc Quad  - 1 x daily - 7 x weekly - 3 sets - 10 reps ?- Seated Isometric Hip Adduction with Pelvic Floor Contraction  - 1 x daily - 7 x weekly - 3 sets - 10 reps ?- Seated March with Resistance  - 1 x daily - 7 x weekly - 3 sets - 10 reps ? ?TREATMENT 4/18: ?Therapeutic Exercise: ?- nu-step L5 64m while taking subjective and planning session with patient ?- Seated horizontal abduction GTB 2x10 ?- Seated diagonals GTB x10 BIL ?- LAQ 2x10 BIL ?- Seated hamstring stretch 2x60" BIL ?- SLR from foam roller - partial ROM - 1x7 ea 1x5 ea ?- bridge - partial ROM 2x5  ?- STS - 2x5 ?- seated row - GTB - 2x10 ?- seated shoulder ext - GTB- x10 (2nd set terminated due to increase in pain) ?- seated marching 2x10 BIL ? ?TREATMENT 4/11: ? ?Therapeutic Exercise: ?- nu-step L5 28m while taking subjective and planning session with patient ?- LTR - 20x ?- L and R HS stretch - 60''x3 ?- SLR from  foam roller - partial ROM - 2x7 ea ?- bridge - partial ROM 3x5  ?- STS - 2x10 ?- seated row - GTB - 2x10 ?- seated shoulder ext - GTB- 2x10 ?- lumbar ext row (next visit) ? ?TREATMENT 4/5: ? ?Therapeutic Exer

## 2021-08-16 ENCOUNTER — Ambulatory Visit: Payer: Medicaid Other | Admitting: Physical Therapy

## 2021-08-16 ENCOUNTER — Ambulatory Visit: Payer: Medicaid Other | Admitting: Dietician

## 2021-08-20 ENCOUNTER — Ambulatory Visit: Payer: Medicaid Other

## 2021-08-20 ENCOUNTER — Ambulatory Visit: Payer: Self-pay

## 2021-08-21 ENCOUNTER — Encounter (HOSPITAL_BASED_OUTPATIENT_CLINIC_OR_DEPARTMENT_OTHER): Payer: Medicaid Other | Admitting: Internal Medicine

## 2021-08-21 ENCOUNTER — Telehealth: Payer: Self-pay | Admitting: Hematology

## 2021-08-21 ENCOUNTER — Other Ambulatory Visit: Payer: Self-pay | Admitting: Licensed Clinical Social Worker

## 2021-08-21 DIAGNOSIS — I890 Lymphedema, not elsewhere classified: Secondary | ICD-10-CM | POA: Diagnosis not present

## 2021-08-21 DIAGNOSIS — I87312 Chronic venous hypertension (idiopathic) with ulcer of left lower extremity: Secondary | ICD-10-CM | POA: Diagnosis not present

## 2021-08-21 DIAGNOSIS — Z86718 Personal history of other venous thrombosis and embolism: Secondary | ICD-10-CM

## 2021-08-21 DIAGNOSIS — L97822 Non-pressure chronic ulcer of other part of left lower leg with fat layer exposed: Secondary | ICD-10-CM

## 2021-08-21 NOTE — Telephone Encounter (Signed)
.  Called patient to schedule appointment per 4/25 inbasket, patient is aware of date and time.   ?

## 2021-08-21 NOTE — Progress Notes (Signed)
MONEE, DEMBECK D. ([id number]) ?Visit Report for 08/21/2021 ?Chief Complaint Document Details ?Patient Name: Date of Service: ?ROMONIA, YANIK D. 08/21/2021 8:45 A M ?Medical Record Number: [id number] ?Patient Account Number: [id number] ?Date of Birth/Sex: Treating RN: ?19-Aug-1970 (51 y.o. [name] ?Primary Care Provider: [name] Other Clinician: ?Referring Provider: ?Treating Provider/Extender: [name] ?[name] ?Weeks in Treatment: 4 ?Information Obtained from: Patient ?Chief Complaint ?07/23/2021; Left lower extremity wound ?Electronic Signature(s) ?Signed: 08/21/2021 12:48:38 PM By: [name] DO ?Entered By: [name] on 08/21/2021 11:47:25 ?-------------------------------------------------------------------------------- ?HPI Details ?Patient Name: Date of Service: ?KHLOEE, GARZA D. 08/21/2021 8:45 A M ?Medical Record Number: [id number] ?Patient Account Number: [id number] ?Date of Birth/Sex: Treating RN: ?26-Nov-1970 (51 y.o. [name] ?Primary Care Provider: [name] Other Clinician: ?Referring Provider: ?Treating Provider/Extender: [name] ?[name] ?Weeks in Treatment: 4 ?History of Present Illness ?HPI Description: Admission 6/24 ?Ms. Alayha Babineaux is a 51 year old female with a past medical history of chronic venous insufficiency, lymphedema, DVT on anticoagulation and diastolic ?heart failure that presents to the clinic for left lower extremity wound. She was last seen 4 months ago in our clinic for the same issue. The reoccurring wound ?started at the end of May and has been going on for 1 month. She has been using an ointment and I am unclear what this is. She tries to keep her leg elevated ?with her compression stocking. She also reports she has lymphedema pumps and has been using them as well. ?She reports mild pain to the area. She denies signs of infection including increased warmth, erythema or purulent drainage. ?7/1;  patient presents for 1 week follow-up. She has tolerated the wrap well. Unfortunately she did have trouble with the wrap Sliding down her leg 2 days ago. ?She has no issues or complaints today. She denies signs of infection. ?7/15; patient presents for follow-up. She has tolerated the wrap well. She has no issues or complaints today. She denies signs of infection. ?7/25; patient presents for 1 week follow-up. She has no issues or complaints today. She denies signs of infection. ?8/4; patient presents for follow-up. She has tolerated the compression wrap well to her left lower extremity. She states she was in the hospital last week due to ?fluid overload. She subsequently developed blisters to her bottom from being in the hospital bed for prolonged periods of time. These have since ruptured. She ?now has 2 areas of skin breakdown. She has been keeping gauze on them. She denies signs of infection. ?8/18; patient presents for 2-week follow-up. She reports improvement to her buttocks wounds. She is able to tolerate the wrap well to her left lower extremity ?with no issues. She denies signs of infection. ?9/1; patient presents for 2-week follow-up. She has no issues or complaints today. she denies signs of infection. ?9/15; patient presents for 2-week follow-up. She has no issues or complaints today. She has tolerated the wrap well. She denies signs of infection. ?9/27; patient presents for 1 week follow-up. She has no issues or complaints today. ?10/20; patient presents for follow-up. She was on vacation for the past 3 weeks. She states she had the last clinic visit wrap in place for 1 week and then she ?took this off. She states that she has been using her compression wraps since. She states that the wound has gotten larger. She reports pain to the wound ?site. She denies fever/chills, nausea/vomiting. She denies purulent drainage. ?10/24; patient presents for follow-up. She states she tolerated the  Kerlix/Coban wrap  well. She reports decrease in pain. She states she started Keflex. She ?denies systemic signs of infection. ?10/28; patient presents for follow-up. She has no issues or complaints today. She denies acute pain. She tolerated the wrap well. ?11/4; patient presents for follow-up. She has no issues or complaints. She denies signs of infection. ?11/11; patient presents for follow-up. She has no issues or complaints today. She denies signs of infection. She tolerated the wrap well. ?11/18; patient presents for follow-up. She states she took a shower however despite a plastic bag the wrap got wet. She currently denies signs of infection. ?12/2; patient presents for follow-up. She tolerated the wrap well. She has no issues or complaints today. ?12/12; patient presents for follow-up. She has no issues or complaints today. ?Readmission 07/23/2021 ?Ms. Luna Audia is a 51 year old female with a past medical history of chronic venous insufficiency that presents to the clinic for a left lower extremity ?wound. She was seen previously in the clinic for the same wound and this was healed on 04/09/2021. Since then she has been wearing her compression ?stockings intermittently. She reports more swelling to her left lower extremity over the past month and she stopped wearing her compression stocking. ?Subsequently she developed a wound. Her Lasix has been increased and she has noticed an improvement in her swelling. She currently denies signs of ?infection. ?4/6; patient presents for follow-up. She states that the wrap stayed in place for 2 days before it rolled down and she had to take it off. She has compression ?stockings however states she could not get these on due to the swelling. She denies signs of infection. ?4/13; patient with chronic venous insufficiency that has been in this clinic previously. She has a small pair of open areas on the left lower leg in the middle of ?previous scar tissue from I believe a previous  wound. Most concerning today is how poor her edema control is above the sclerotic skin in her lower extremity. ?This is nonpitting and compatible with lymphedema. She tells me that she was using a juxta lite stocking although it was old we are apparently replacing this. ?She does not seem aware of how to adjust the tension with this and I think she is going to require 30/40 mmHg equivalent compression ?She has her arterial studies tomorrow at 2:00 ?4/25; patient presents for follow-up. ABIs done on 4/14 showed a left ABI of 1.04 and TBI of 0.99. She tolerated the compression wrap well This week. She ?states she is going to obtain compression stockings today. She has no issues or complaints today. ?Electronic Signature(s) ?Signed: 08/21/2021 12:48:38 PM By: [name] DO ?Entered By: [name] on 08/21/2021 11:49:33 ?-------------------------------------------------------------------------------- ?Physical Exam Details ?Patient Name: Date of Service: ?JENISSE, VULLO D. 08/21/2021 8:45 A M ?Medical Record Number: [id number] ?Patient Account Number: [id number] ?Date of Birth/Sex: Treating RN: ?1970-07-19 (51 y.o. [name] ?Primary Care Provider: [name] Other Clinician: ?Referring Provider: ?Treating Provider/Extender: [name] ?[name] ?Weeks in Treatment: 4 ?Constitutional ?respirations regular, non-labored and within target range for patient.[name] ?Cardiovascular ?2+ dorsalis pedis/posterior tibialis pulses. ?Psychiatric ?pleasant and cooperative. ?Notes ?Pinpoint open wound to the left lower extremity with granulation tissue present. ?Electronic Signature(s) ?Signed: 08/21/2021 12:48:38 PM By: [name] DO ?Entered By: [name] on 08/21/2021 12:40:38 ?-------------------------------------------------------------------------------- ?Physician Orders Details ?Patient Name: ?Date of Service: ?LATONGA, PONDER D. 08/21/2021 8:45 A M ?Medical Record Number:  [id number] ?Patient Account Number: [id number] ?Date of Birth/Sex: ?Treating RN: ?  Jun 07, 1970 (51 y.o. [name] ?Primary Care Provider: [name] ?Other Clinician: ?Referring Provider: ?Treating Provider

## 2021-08-21 NOTE — Patient Outreach (Signed)
Medicaid Managed Care Social Work Note  08/21/2021 Name:  [name] MRN:  [id number] DOB:  [birth year]  [name] is an 51 y.o. year old female who is a primary patient of Marcine Matar, MD.  The Medicaid Managed Care Coordination team was consulted for assistance with:  Mental Health Counseling and Resources  [name] was given information about Medicaid Managed Care Coordination team services today. [name] Patient agreed to services and verbal consent obtained.  Engaged with patient  for by telephone forinitial visit in response to referral for case management and/or care coordination services.   Assessments/Interventions:  Review of past medical history, allergies, medications, health status, including review of consultants reports, laboratory and other test data, was performed as part of comprehensive evaluation and provision of chronic care management services.  SDOH: (Social Determinant of Health) assessments and interventions performed: SDOH Interventions    Flowsheet Row Most Recent Value  SDOH Interventions   Stress Interventions Offered [employer], Provide Counseling       Advanced Directives Status:  Not addressed in this encounter.  Care Plan                 Allergies  Allergen Reactions   Ace Inhibitors Rash and Other (See Comments)    Make pt bleed   Aspirin Other (See Comments)    Per patient paperwork: blood clot?  Likely because of chronic DOAC   Hydromorphone Hives and Itching   Vancomycin Itching and Rash   Contrast Media [Iodinated Contrast Media] Hives   Dilaudid [Hydromorphone Hcl] Hives   Lidocaine Itching    Itching with Lidocaine patch reported 06/13/2021 vis telephone message.    Medications Reviewed Today     Reviewed by Harland German, PTA (Physical Therapy Assistant) on 08/14/21 at 1212  Med List Status: <None>   Medication Order Taking? Sig Documenting Provider Last Dose  Status Informant  Accu-Chek Softclix Lancets lancets [id number] No Use as instructed  Patient taking differently: 1 each by Other route See admin instructions. Use as instructed   Anders Simmonds, PA-C Taking Active Self  acetaminophen (TYLENOL) 500 MG tablet [id number] No Take 1 tablet (500 mg total) by mouth every 6 (six) hours as needed.  Patient taking differently: Take 500 mg by mouth every 6 (six) hours as needed for mild pain.   Marcine Matar, MD Taking Active Self  albuterol [facility] HFA) 108 [phone] Base) MCG/ACT inhaler [id number] No INHALE TWO PUFFS BY MOUTH EVERY 6 HOURS AS NEEDED FOR WHEEZING OR SHORTNESS OF BREATH  Patient taking differently: Inhale 2 puffs into the lungs See admin instructions. INHALE TWO PUFFS BY MOUTH EVERY 6 HOURS AS NEEDED FOR WHEEZING OR SHORTNESS OF Benay Spice, MD Taking Active Self  amLODipine (NORVASC) 5 MG tablet [id number] No TAKE 1/2 TABLET BY MOUTH DAILY  Patient taking differently: Take 2.5 mg by mouth daily.   Marcine Matar, MD Taking Active Self  atorvastatin (LIPITOR) 40 MG tablet [id number] No TAKE 1 TABLET BY MOUTH DAILY Marcine Matar, MD Taking Active   Blood Glucose Calibration (ACCU-CHEK GUIDE CONTROL) LIQD [id number] No 1 each by In Vitro route daily. Anders Simmonds, PA-C Taking Active Self  Blood Glucose Monitoring Suppl (ACCU-CHEK GUIDE) w/Device KIT [id number] No 1 each by Does not apply route 2 (two) times daily. Anders Simmonds, PA-C Taking Active Self  Carboxymethylcellulose Sodium (EYE DROPS OP) [id number] No Place 1 drop into both eyes daily as  needed (dry eyes). [provider] Taking Active Self  carvedilol (COREG) 25 MG tablet [id number]  TAKE ONE TABLET BY MOUTH TWICE A DAY WITH MEALS Marcine Matar, MD  Active   cholecalciferol (VITAMIN D3) 25 MCG (1000 UNIT) tablet [id number] No Take 1 tablet (1,000 Units total) by mouth daily. Marcine Matar, MD Taking Active Self  COVID-19 mRNA Vac-TriS,  Pfizer, (PFIZER-BIONT COVID-19 VAC-TRIS) SUSP injection [id number] No Inject into the muscle. Judyann Munson, MD Taking Active   dapagliflozin propanediol (FARXIGA) 10 MG TABS tablet [id number] No Take 1 tablet (10 mg total) by mouth daily. Marcine Matar, MD Taking Active Self  DULERA 200-5 MCG/ACT Sandrea Matte [id number] No INHALE 2 PUFFS INTO THE LUNGS TWO TIMES A DAY  Patient taking differently: Inhale 2 puffs into the lungs daily.   Marcine Matar, MD Taking Active Self  ELIQUIS 5 MG TABS tablet [id number] No TAKE ONE TABLET BY MOUTH TWICE A DAY  Patient taking differently: Take 5 mg by mouth 2 (two) times daily.   Marcine Matar, MD Taking Active Self  FEROSUL 325 (65 Fe) MG tablet [id number] No TAKE ONE TABLET BY MOUTH DAILY WITH BREAKFAST Marcine Matar, MD Taking Active   furosemide (LASIX) 80 MG tablet [id number] No Take 1 tablet (80 mg total) by mouth daily. Take additional dose of lasix for weight gain, leg swelling Dorcas Carrow, MD Taking Active   gabapentin (NEURONTIN) 100 MG capsule [id number] No TAKE 1 CAPSULE BY MOUTH THREE TIMES A DAY AS NEEDED FOR PAN  Patient taking differently: Take 100 mg by mouth 3 (three) times daily as needed (pain).   Marcine Matar, MD Taking Active Self  glucose blood test strip [id number] No Use as instructed  Patient taking differently: 1 each by Other route See admin instructions. Use as instructed   Anders Simmonds, PA-C Taking Active Self  HYDROcodone-acetaminophen (NORCO/VICODIN) 5-325 MG tablet [id number] No Take 1 tablet by mouth every 12 (twelve) hours as needed for moderate pain (severe pain). Persons, [place], [place] Taking Active   hydrocortisone cream 1 % [id number] No Apply 1 application topically daily as needed for itching. [provider] Taking Active Self  hydrOXYzine (VISTARIL) 25 MG capsule [id number] No Take 1 capsule (25 mg total) by mouth at bedtime as needed.  Patient taking differently: Take 25 mg by mouth at  bedtime as needed for anxiety or itching.   Marcine Matar, MD Taking Active Self  KLOR-CON M20 20 MEQ tablet [id number] No TAKE ONE TABLET BY MOUTH DAILY  Patient taking differently: Take 20 mEq by mouth daily.   Marcine Matar, MD Taking Active Self  methocarbamol (ROBAXIN) 500 MG tablet [id number] No TAKE ONE TABLET BY MOUTH DAILY AS NEEDED FOR MUSCLE SPASMS  Patient taking differently: Take 500 mg by mouth See admin instructions. TAKE ONE TABLET BY MOUTH DAILY AS NEEDED FOR MUSCLE SPASMS   Marcine Matar, MD Taking Active Self  nitroGLYCERIN (NITROSTAT) 0.4 MG SL tablet [id number] No Place 1 tablet (0.4 mg total) under the tongue every 5 (five) minutes as needed for chest pain. Glade Lloyd, MD Taking Active Self  omeprazole (PRILOSEC) 20 MG capsule [id number] No Take 1 capsule (20 mg total) by mouth daily as needed (acid reflux). Ivonne Andrew, NP 05/25/2021 Expired 05/31/21 2359 Self  PARoxetine (PAXIL) 10 MG tablet [id number] No Take 1 tablet (10 mg total) by mouth daily. Marcine Matar, MD Taking Active   spironolactone (ALDACTONE) 25 MG tablet [id number]  TAKE ONE TABLET BY MOUTH DAILY Marcine Matar, MD  Active   valsartan (DIOVAN) 160 MG tablet [id number] No Take 1 tablet (160 mg total) by mouth daily. Marykay Lex, MD Taking Active Self  vitamin B-12 (CYANOCOBALAMIN) 1000 MCG tablet [id number] No Take 1,000 mcg by mouth daily. [provider] Taking Active Self            Patient Active Problem List   Diagnosis Date Noted   OSA (obstructive sleep apnea) 07/11/2021   Depression 05/28/2021   Encounter for care related to Port-a-Cath 05/27/2021   Acute on chronic respiratory failure with hypoxia (HCC) 05/26/2021   Morbid obesity with BMI of 50.0-59.9, adult (HCC) 05/26/2021   Acute respiratory failure (HCC) 11/23/2020   Acute respiratory failure with hypoxia (HCC) 11/22/2020   Fever 11/22/2020   Bilateral lower extremity edema 11/22/2020   History  of DVT (deep vein thrombosis) 11/22/2020   Asthma, chronic, moderate persistent, with acute exacerbation 11/22/2020   Lower limb ulcer, calf, left, limited to breakdown of skin (HCC) 06/09/2020   Lymphedema 06/09/2020   Iron deficiency anemia due to chronic blood loss 05/10/2020   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 (HCC) 04/13/2020   Venous insufficiency 04/13/2020   S/P insertion of IVC (inferior vena caval) filter 04/13/2020   Chronic anemia 04/13/2020   Essential hypertension 04/13/2020   (HFpEF) heart failure with preserved ejection fraction (HCC) -> although echo suggests normal diastolic parameters with normal left atrial size 04/13/2020   CAD (coronary artery disease) 11/17/2019    Conditions to be addressed/monitored per PCP order:  Anxiety and Depression  [facility] LCSW completed initial outreach to patient on 08/21/21 and successfully spoke to patient and completed assessment. Patient reports that she is experiencing stress from her chronic pain and mother's level of care concerns. [facility] LCSW provided emotional support and brief education on stress management and coping skills. Patient denies needing [facility] LCSW follow up as she does not want to pursue counseling or psychiatry at [facility] time. She reports that she experiences depression occasionally but [facility] or PCP if she desires to gain mental health support. Patient is agreeable to this plan. Patient wishes to gain caregiver support resources for her mother. [facility] BSW appointment with patient was scheduled on 08/29/21 at 9:00 am for caregiver resource assistance by [facility] LCSW. [facility] LCSW will close referral at this time but is happy to get back involved if ever needed. [facility] LCSW will update [facility] BSW and [facility] RNCM.   There are no care plans that you recently modified to display for this patient.  Follow up:  Patient requests no follow-up at this time.  Plan: The Managed Medicaid care management  team will reach out to the patient again over the next 14 days.  Date/time of next scheduled Social Work care management/care coordination outreach:  08/29/21 at 9:00 am by [facility] BSW for caregiver resource support.  Dickie La, BSW, MSW, [employer] [employer] [place] Managed Medicaid LCSW [facility]  [facility] [email or web address] Phone: [phone]

## 2021-08-21 NOTE — Patient Instructions (Signed)
Visit Information ? ?[name] was given information about Medicaid Managed Care team care coordination services as a part of their [place] Medicaid benefit. [name] verbally consented to engagement with the [facility] Managed Care team.  ? ?If you are experiencing a medical emergency, please call 911 or report to your local emergency department or urgent care.  ? ?If you have a non-emergency medical problem during routine business hours, please contact your provider's office and ask to speak with a nurse.  ? ?For questions related to your [facility], please call: [phone] or visit the homepage here: [email or web address] ? ?If you would like to schedule transportation through your [facility], please call the following number at least 2 days in advance of your appointment: [phone]. ? Rides for urgent appointments can also be made after hours by calling Member Services. ? ?Call the [place] at [phone], at any time, 24 hours a day, 7 days a week. If you are in danger or need immediate medical attention call 911. ? ?If you would like help to quit smoking, call 1-800-QUIT-NOW (304) 865-0712) OR Espa?ol: 1-855-D?jelo-Ya 747-638-8136) o para m?s informaci?n haga clic aqu? or Text READY to 200-400 to register via text ? ? ?Social Worker will follow up with you regarding caregiver support resources.  ? ? ?Following is a copy of your plan of care:  ?There are no care plans that you recently modified to display for this patient. ? ?[name], BSW, MSW, LCSW ?Managed Medicaid LCSW ?[facility] ?[name] ?Phone: [phone] ? ? ?  ?

## 2021-08-22 ENCOUNTER — Encounter: Payer: Medicaid Other | Admitting: Physical Therapy

## 2021-08-23 ENCOUNTER — Ambulatory Visit: Payer: Medicaid Other | Admitting: Hematology

## 2021-08-23 ENCOUNTER — Ambulatory Visit: Payer: Medicaid Other

## 2021-08-23 ENCOUNTER — Other Ambulatory Visit: Payer: Medicaid Other

## 2021-08-23 DIAGNOSIS — M546 Pain in thoracic spine: Secondary | ICD-10-CM

## 2021-08-23 DIAGNOSIS — M6281 Muscle weakness (generalized): Secondary | ICD-10-CM

## 2021-08-23 DIAGNOSIS — M545 Low back pain, unspecified: Secondary | ICD-10-CM | POA: Diagnosis not present

## 2021-08-23 DIAGNOSIS — R2689 Other abnormalities of gait and mobility: Secondary | ICD-10-CM

## 2021-08-23 NOTE — Therapy (Signed)
?OUTPATIENT PHYSICAL THERAPY TREATMENT NOTE ? ? ?Patient Name: [name] ?MRN: [id number] ?DOB:[birth year], 51 y.o., female ?Today's Date: 08/23/2021 ? ?PCP: Ladell Pier, MD ?REFERRING PROVIDER: Ladell Pier, MD ? ? PT End of Session - 08/23/21 1215   ? ? Visit Number 5   ? Date for PT Re-Evaluation 09/14/21   ? Authorization Type UHC MCD   ? PT Start Time 1213   ? PT Stop Time 1258   ? PT Time Calculation (min) 45 min   ? Activity Tolerance Patient tolerated treatment well;Patient limited by pain;Patient limited by fatigue   ? Behavior During Therapy [facility] for tasks assessed/performed   ? ?  ?  ? ?  ? ? ? ? ?Past Medical History:  ?Diagnosis Date  ? (HFpEF) heart failure with preserved ejection fraction (HCC) -> although echo suggests normal diastolic parameters with normal left atrial size 04/13/2020  ? Cellulitis   ? Coronary artery disease involving native coronary artery without angina pectoris   ? PT STATES - HAD A CARDIAC CATH - NOT TOLD SHE HAD CAD -> week note from [place] indicates history of MI (patient cannot corroborate  ? Diabetes mellitus without complication ([place])   ? DVT (deep venous thrombosis) ([place]) 09/17/2017  ? 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 Generalized anxiety disorder   ? H/O gastric ulcer 11/16/2018  ? History of small bowel obstruction   ? In childhood  ? Hypertension   ? Iron deficiency anemia due to chronic blood loss   ? Previously been followed by hematology for iron infusion every 2 weeks and as of 2019; full GI evaluation including capsule endoscopy negative.  ? Morbid obesity due to excess calories ([place])   ? OSA (obstructive sleep apnea) 07/11/2021  ? Sleep study April 06, 2019 (Dr. Halford Chessman): Moderate OSA (AHI of 24.4 and SPO2 low of 77%).  Majority events during REM sleep.-recommend CPAP, oral appliance or surgical. Reviewed sleep hygiene.  Avoid sedatives.    ? Osteoarthritis of left knee   ? Prediabetes   ? Small bowel  obstruction ([place])   ? as a child  ? Speech impediment   ? Stutter / stammer  ? ?Past Surgical History:  ?Procedure Laterality Date  ? ABDOMINAL WALL DEFECT REPAIR  1970  ? IR CV LINE INJECTION  10/24/2020  ? IR REMOVAL TUN ACCESS W/ PORT W/O FL MOD SED  05/28/2021  ? IVC FILTER INSERTION  2017  ? Lower Extremity Venous Duplex  06/23/2020  ? No evidence of DVT or superficial thrombosis bilaterally.  No evidence of deep venous insufficiency bilaterally.  No evidence of SSV reflux.  Right GSV in the calf has reflux, no reflux in L GSV.;  Repeated in July 2020-no DVT  ? OOPHORECTOMY  1996  ? OOPHORECTOMY  1997  ? PORTACATH PLACEMENT  2014  ? TRANSTHORACIC ECHOCARDIOGRAM  11/25/2020  ? EF 55 to 60%.  Normal LV function.  No RWM A.  Mild LVH.  GR 1 DD.  Normal PAP.  Normal aortic and mitral valve.  Mildly elevated RAP  ~8 mmHg.  ? ?Patient Active Problem List  ? Diagnosis Date Noted  ? OSA (obstructive sleep apnea) 07/11/2021  ? Depression 05/28/2021  ? Encounter for care related to Port-a-Cath 05/27/2021  ? Acute on chronic respiratory failure with hypoxia ([place]) 05/26/2021  ? Morbid obesity with BMI of 50.0-59.9, adult ([place]) 05/26/2021  ? Acute respiratory failure ([place]) 11/23/2020  ? Acute respiratory failure with hypoxia ([place])  11/22/2020  ? Fever 11/22/2020  ? Bilateral lower extremity edema 11/22/2020  ? History of DVT (deep vein thrombosis) 11/22/2020  ? Asthma, chronic, moderate persistent, with acute exacerbation 11/22/2020  ? Lower limb ulcer, calf, left, limited to breakdown of skin ([place]) 06/09/2020  ? Lymphedema 06/09/2020  ? Iron deficiency anemia due to chronic blood loss 05/10/2020  ? 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 ([place]) 04/13/2020  ? Venous insufficiency 04/13/2020  ? S/P insertion of IVC (inferior vena caval) filter 04/13/2020  ? Chronic anemia 04/13/2020  ? Essential hypertension 04/13/2020  ? (HFpEF) heart failure with preserved ejection fraction (HCC) ->  although echo suggests normal diastolic parameters with normal left atrial size 04/13/2020  ? CAD (coronary artery disease) 11/17/2019  ? ? ?THERAPY DIAG:  ?Chronic low back pain, unspecified back pain laterality, unspecified whether sciatica present ? ?Pain in thoracic spine ? ?Muscle weakness ? ?Other abnormalities of gait and mobility ? ?REFERRING DIAG: Referral diagnosis: Acute right-sided low back pain with sciatica, sciatica laterality unspecified [M54.40] ? ?PERTINENT HISTORY: DVT (L calf, chronic), long term use of blood thinners, Asthma, morbid obesity, lymphedema, non healing wound L leg, DM TII, CHF (monitor during supine activities) ? ?PRECAUTIONS/RESTRICTIONS:  ? ?Fall ? ?SUBJECTIVE: Patient reports that she has been doing her home exercises. ? ?Pain:  ?Are you having pain? Yes ?Pain location: upper tx to lower lumbar spine ?NPRS scale:  ?current 9.5/10  ?Aggravating factors: movement ?Relieving factors: sitting down (helps for short periods)       ?Pain description: constant, sharp, and aching ?Stage: Chronic ? ?OBJECTIVE: (taken on eval unless noted) ? ?MUSCLE LENGTH: ?Hamstrings: Right moderate restriction; Left no restriction ?ASLR: Right ASLR significantly reduced compared to PSLR ; Left ASLR significantly reduced compared to PSLR  ?  ?  ?LUMBAR AROM ?  ?AROM AROM  ?07/20/2021  ?Flexion WNL, w/ concordant pain  ?Extension limited by 25%, w/ concordant pain  ?Right lateral flexion limited by 25%, w/ concordant pain  ?Left lateral flexion limited by 50%, w/ concordant pain  ?Right rotation limited by 25%, w/ concordant pain  ?Left rotation limited by 25%, w/ concordant pain  ?  ?(Blank rows = not tested) ?  ?  ?LE MMT: ?  ?MMT Right ?07/20/2021 Left ?07/20/2021  ?Hip flexion (L2, L3)      ?Knee extension (L3)      ?Knee flexion      ?Hip abduction      ?Hip extension      ?Hip external rotation      ?Hip internal rotation      ?Hip adduction      ?Ankle dorsiflexion (L4)      ?Ankle plantarflexion (S1)       ?Ankle inversion      ?Ankle eversion      ?Great Toe ext (L5)      ?Grossly 3+ 3+  ?  ?(Blank rows = not tested, score listed is out of 5 possible points.  N = WNL, D = diminished, C = clear for gross weakness with myotome testing, * = concordant pain with testing) ?  ?Functional Tests ?  ?Eval (07/20/2021)      ?Sustained supine bridge (dominant leg extended at 120'', if reached): 0'' (norm 170'')      ?15'' STS: 9x  UE used? Y      ?       ?       ?       ?       ?       ?       ?       ?       ?       ?       ?       ?       ? ? ?  ASTERISK SIGNS ?  ?  ?[place]isk Signs Eval (07/20/2021)  4/11          ?R HS length Mod limitation            ?ASLR vs PSLR Significant deficit            ?pain 10/10  6/10          ?STS 9x w/ UE            ?Bridge endurance unable  very small ROM          ?  ?  ?HOME EXERCISE PROGRAM: ?Access Code: [email or web address] ?URL: [email or web address] ?Date: 07/20/2021 ?Prepared by: Shearon Balo ?  ?Exercises ?- Seated Hip Abduction with Pelvic Floor Contraction and Resistance Loop  - 1 x daily - 7 x weekly - 3 sets - 10 reps ?- Seated Long Arc Quad  - 1 x daily - 7 x weekly - 3 sets - 10 reps ?- Seated Isometric Hip Adduction with Pelvic Floor Contraction  - 1 x daily - 7 x weekly - 3 sets - 10 reps ?- Seated March with Resistance  - 1 x daily - 7 x weekly - 3 sets - 10 reps ? ?TREATMENT 4/27: ?Therapeutic Exercise: ?- nu-step L5 49m while taking subjective and planning session with patient ?- Seated horizontal abduction GTB 2x10 ?- Seated diagonals GTB x10 BIL ?- LAQ 2x10 BIL ?- Seated clamshells 3x10 ?- Seated hamstring stretch 2x60" BIL ?- STS - 2x5 ?- seated row - GTB - 2x10 ?- seated marching 3x10 BIL ? ?TREATMENT 4/18: ?Therapeutic Exercise: ?- nu-step L5 38m while taking subjective and planning session with patient ?- Seated horizontal abduction GTB 2x10 ?- Seated diagonals GTB x10 BIL ?- LAQ 2x10 BIL ?- Seated hamstring stretch 2x60" BIL ?- SLR from foam roller - partial ROM  - 1x7 ea 1x5 ea ?- bridge - partial ROM 2x5  ?- STS - 2x5 ?- seated row - GTB - 2x10 ?- seated shoulder ext - GTB- x10 (2nd set terminated due to increase in pain) ?- seated marching 2x10 BIL ? ?TREATMENT 4/11: ?

## 2021-08-24 NOTE — Progress Notes (Signed)
[name], [name] ([id number]) ?Visit Report for 08/21/2021 ?Arrival Information Details ?Patient Name: Date of Service: ?[name], [name] 08/21/2021 8:45 A M ?Medical Record Number: [id number] ?Patient Account Number: [id number] ?Date of Birth/Sex: Treating RN: ?[birth year] (51 y.o. [name] ?Primary Care Chareese Sergent: [name] Other Clinician: ?Referring Brookelyn Gaynor: ?Treating Arriyah Madej/Extender: [name] ?[name] ?Weeks in Treatment: 4 ?Visit Information History Since Last Visit ?All ordered tests and consults were completed: No ?Patient Arrived: Ambulatory ?Added or deleted any medications: No ?Arrival Time: 08:27 ?Any new allergies or adverse reactions: No ?Accompanied By: self ?Had a fall or experienced change in No ?Transfer Assistance: None ?activities of daily living that may affect ?Patient Identification Verified: Yes ?risk of falls: ?Secondary Verification Process Completed: Yes ?Signs or symptoms of abuse/neglect since last visito No ?Patient Requires Transmission-Based Precautions: No ?Hospitalized since last visit: No ?Patient Has Alerts: Yes ?Implantable device outside of the clinic excluding No ?Patient Alerts: Patient on Blood Thinner cellular tissue based products placed in the center ?L ABI=[place] since last visit: ?Pain Present Now: No ?Electronic Signature(s) ?Signed: 08/24/2021 9:12:08 AM By: Sandre Kitty ?Entered By: Sandre Kitty on 08/21/2021 08:34:12 ?-------------------------------------------------------------------------------- ?Compression Therapy Details ?Patient Name: Date of Service: ?[name], [name] 08/21/2021 8:45 A M ?Medical Record Number: [id number] ?Patient Account Number: [id number] ?Date of Birth/Sex: Treating RN: ?08/06/1970 (51 y.o. [name] ?Primary Care Lamiah Marmol: [name] Other Clinician: ?Referring Shanda Cadotte: ?Treating Verna Hamon/Extender: [name] ?[name] ?Weeks in Treatment: 4 ?Compression Therapy Performed  for Wound Assessment: Wound #7 Left,Proximal,Anterior Lower Leg ?Performed By: Clinician [name], RN ?Compression Type: Four Layer ?Post Procedure Diagnosis ?Same as Pre-procedure ?Electronic Signature(s) ?Signed: 08/21/2021 4:50:27 PM By: [name] ?Entered By: [name] on 08/21/2021 09:29:03 ?-------------------------------------------------------------------------------- ?Encounter Discharge Information Details ?Patient Name: ?Date of Service: ?[name], [name] 08/21/2021 8:45 A M ?Medical Record Number: [id number] ?Patient Account Number: [id number] ?Date of Birth/Sex: ?Treating RN: ?1971-02-16 (51 y.o. [name] ?Primary Care Elias Dennington: [name] ?Other Clinician: ?Referring Nakenya Theall: ?Treating Dywane Peruski/Extender: [name] ?[name] ?Weeks in Treatment: 4 ?Encounter Discharge Information Items ?Discharge Condition: Stable ?Ambulatory Status: Ambulatory ?Discharge Destination: Home ?Transportation: Private Auto ?Schedule Follow-up Appointment: Yes ?Clinical Summary of Care: Provided on 08/21/2021 ?Form Type Recipient ?Paper Patient Patient ?Electronic Signature(s) ?Signed: 08/21/2021 4:50:27 PM By: [name] ?Entered By: [name] on 08/21/2021 09:45:21 ?-------------------------------------------------------------------------------- ?Lower Extremity Assessment Details ?Patient Name: ?Date of Service: ?[name], [name] 08/21/2021 8:45 A M ?Medical Record Number: [id number] ?Patient Account Number: [id number] ?Date of Birth/Sex: ?Treating RN: ?1970/11/27 (51 y.o. [name] ?Primary Care Natilie Krabbenhoft: [name] ?Other Clinician: ?Referring Linzy Darling: ?Treating Ayeza Therriault/Extender: [name] ?[name] ?Weeks in Treatment: 4 ?Edema Assessment ?Assessed: [Left: Yes] [Right: No] ?Edema: [Left: Ye] [Right: s] ?Calf ?Left: Right: ?Point of Measurement: 29 cm From Medial Instep 54 cm ?Ankle ?Left: Right: ?Point of Measurement: 10 cm From Medial  Instep 23 cm ?Vascular Assessment ?Pulses: ?Dorsalis Pedis ?Palpable: [Left:Yes] ?Electronic Signature(s) ?Signed: 08/21/2021 4:50:27 PM By: [name] ?Entered By: [name] on 08/21/2021 08:57:52 ?-------------------------------------------------------------------------------- ?Multi Wound Chart Details ?Patient Name: ?Date of Service: ?[name], [name] 08/21/2021 8:45 A M ?Medical Record Number: [id number] ?Patient Account Number: [id number] ?Date of Birth/Sex: ?Treating RN: ?08/09/1970 (51 y.o. [name] ?Primary Care Mayuri Staples: [name] ?Other Clinician: ?Referring Zykeem Bauserman: ?Treating Wister Hoefle/Extender: [name] ?[name] ?Weeks in Treatment: 4 ?Vital Signs ?Height(in): ?Capillary Blood Glucose(mg/dl): 132 ?Weight(lbs): ?Pulse(bpm): 79 ?Body Mass Index(BMI): ?Blood Pressure(mmHg): 144/74 ?Temperature(??F): 98.1 ?Respiratory  Rate(breaths/min): 18 ?Photos: [N/A:N/A] ?Left, Proximal, Anterior Lower Leg Left, Distal, Anterior Lower Leg N/A ?Wound Location: ?Gradually Appeared Gradually Appeared N/A ?Wounding Event: ?Venous Leg Ulcer Venous Leg Ulcer N/A ?Primary Etiology: ?Lymphedema, Sleep Apnea, Lymphedema, Sleep Apnea, N/A ?Comorbid History: ?Congestive Heart Failure, Coronary Congestive Heart Failure, Coronary ?Artery Disease, Hypertension Artery Disease, Hypertension ?06/26/2021 06/26/2021 N/A ?Date Acquired: ?4 4 N/A ?Weeks of Treatment: ?Open Healed - Epithelialized N/A ?Wound Status: ?No No N/A ?Wound Recurrence: ?0.1x0.1x0.1 0x0x0 N/A ?Measurements L x W x D (cm) ?0.008 0 N/A ?A (cm?) : ?rea ?0.001 0 N/A ?Volume (cm?) : ?99.60% 100.00% N/A ?% Reduction in Area: ?99.50% 100.00% N/A ?% Reduction in Volume: ?Full Thickness Without Exposed Full Thickness Without Exposed N/A ?Classification: ?Support Structures Support Structures ?Medium N/A N/A ?Exudate Amount: ?Serosanguineous N/A N/A ?Exudate Type: ?red, brown N/A N/A ?Exudate Color: ?Flat and Intact N/A N/A ?Wound  Margin: ?Large (67-100%) N/A N/A ?Granulation Amount: ?Red, Pink N/A N/A ?Granulation Quality: ?None Present (0%) N/A N/A ?Necrotic Amount: ?Fat Layer (Subcutaneous Tissue): Yes N/A N/A ?Exposed Structures: ?Fascia: No ?Tendon: No ?Muscle: No ?Joint: No ?Bone: No ?Large (67-100%) N/A N/A ?Epithelialization: ?Compression Therapy N/A N/A ?Procedures Performed: ?Treatment Notes ?Wound #7 (Lower Leg) Wound Laterality: Left, Anterior, Proximal ?Cleanser ?Soap and Water ?Discharge Instruction: May shower and wash wound with dial antibacterial soap and water prior to dressing change. ?Wound Cleanser ?Discharge Instruction: Cleanse the wound with wound cleanser prior to applying a clean dressing using gauze sponges, not tissue or cotton balls. ?Peri-Wound Care ?Triamcinolone 15 (g) ?Discharge Instruction: Use triamcinolone 15 (g) as directed ?Sween Lotion (Moisturizing lotion) ?Discharge Instruction: Apply moisturizing lotion as directed ?Topical ?Primary Dressing ?Promogran Prisma Matrix, 4.34 (sq in) (silver collagen) ?Discharge Instruction: Moisten collagen with saline or hydrogel ?Secondary Dressing ?Woven Gauze Sponge, Non-Sterile 4x4 in ?Discharge Instruction: Apply over primary dressing as directed. ?Secured With ?Compression Wrap ?FourPress (4 layer compression wrap) ?Discharge Instruction: Apply four layer compression as directed. May also use Miliken CoFlex 2 layer compression system as alternative. ?Compression Stockings ?Add-Ons ?Electronic Signature(s) ?Signed: 08/21/2021 12:48:38 PM By: [name] DO ?Signed: 08/21/2021 4:50:27 PM By: [name] ?Entered By: [name] on 08/21/2021 11:47:13 ?-------------------------------------------------------------------------------- ?Multi-Disciplinary Care Plan Details ?Patient Name: ?Date of Service: ?[name], [name] 08/21/2021 8:45 A M ?Medical Record Number: [id number] ?Patient Account Number: [id number] ?Date of Birth/Sex: ?Treating RN: ?1970-12-08  (51 y.o. [name] ?Primary Care Nataleigh Griffin: [name] ?Other Clinician: ?Referring Fatin Bachicha: ?Treating Kama Cammarano/Extender: [name] ?[name] ?Weeks in Treatment: 4 ?Active Inactiv

## 2021-08-28 ENCOUNTER — Encounter (HOSPITAL_BASED_OUTPATIENT_CLINIC_OR_DEPARTMENT_OTHER): Payer: Medicaid Other | Attending: Internal Medicine | Admitting: Internal Medicine

## 2021-08-28 DIAGNOSIS — I87312 Chronic venous hypertension (idiopathic) with ulcer of left lower extremity: Secondary | ICD-10-CM | POA: Diagnosis not present

## 2021-08-28 DIAGNOSIS — Z86718 Personal history of other venous thrombosis and embolism: Secondary | ICD-10-CM | POA: Diagnosis not present

## 2021-08-28 DIAGNOSIS — I890 Lymphedema, not elsewhere classified: Secondary | ICD-10-CM | POA: Diagnosis not present

## 2021-08-28 DIAGNOSIS — L97822 Non-pressure chronic ulcer of other part of left lower leg with fat layer exposed: Secondary | ICD-10-CM | POA: Diagnosis not present

## 2021-08-28 NOTE — Progress Notes (Signed)
[name], [name] ([id number]) ?Visit Report for 08/28/2021 ?Chief Complaint Document Details ?Patient Name: Date of Service: ?[name], [name] 08/28/2021 8:45 A M ?Medical Record Number: [id number] ?Patient Account Number: [id number] ?Date of Birth/Sex: Treating RN: ?[birth year] (51 y.o. [name] ?Primary Care Provider: [name] Other Clinician: ?Referring Provider: ?Treating Provider/Extender: [name] ?[name] ?Weeks in Treatment: 5 ?Information Obtained from: Patient ?Chief Complaint ?07/23/2021; Left lower extremity wound ?Electronic Signature(s) ?Signed: 08/28/2021 9:24:58 AM By: Megan Shan DO ?Entered By: [name] on 08/28/2021 09:00:26 ?-------------------------------------------------------------------------------- ?HPI Details ?Patient Name: Date of Service: ?[name], [name] 08/28/2021 8:45 A M ?Medical Record Number: [id number] ?Patient Account Number: [id number] ?Date of Birth/Sex: Treating RN: ?11/23/1970 (51 y.o. [name] ?Primary Care Provider: [name] Other Clinician: ?Referring Provider: ?Treating Provider/Extender: [name] ?[name] ?Weeks in Treatment: 5 ?History of Present Illness ?HPI Description: Admission 6/24 ?Ms. Kaina Orengo is a 51 year old female with a past medical history of chronic venous insufficiency, lymphedema, DVT on anticoagulation and diastolic ?heart failure that presents to the clinic for left lower extremity wound. She was last seen 4 months ago in our clinic for the same issue. The reoccurring wound ?started at the end of May and has been going on for 1 month. She has been using an ointment and I am unclear what this is. She tries to keep her leg elevated ?with her compression stocking. She also reports she has lymphedema pumps and has been using them as well. ?She reports mild pain to the area. She denies signs of infection including increased warmth, erythema or purulent drainage. ?7/1; patient  presents for 1 week follow-up. She has tolerated the wrap well. Unfortunately she did have trouble with the wrap Sliding down her leg 2 days ago. ?She has no issues or complaints today. She denies signs of infection. ?7/15; patient presents for follow-up. She has tolerated the wrap well. She has no issues or complaints today. She denies signs of infection. ?7/25; patient presents for 1 week follow-up. She has no issues or complaints today. She denies signs of infection. ?8/4; patient presents for follow-up. She has tolerated the compression wrap well to her left lower extremity. She states she was in the hospital last week due to ?fluid overload. She subsequently developed blisters to her bottom from being in the hospital bed for prolonged periods of time. These have since ruptured. She ?now has 2 areas of skin breakdown. She has been keeping gauze on them. She denies signs of infection. ?8/18; patient presents for 2-week follow-up. She reports improvement to her buttocks wounds. She is able to tolerate the wrap well to her left lower extremity ?with no issues. She denies signs of infection. ?9/1; patient presents for 2-week follow-up. She has no issues or complaints today. she denies signs of infection. ?9/15; patient presents for 2-week follow-up. She has no issues or complaints today. She has tolerated the wrap well. She denies signs of infection. ?9/27; patient presents for 1 week follow-up. She has no issues or complaints today. ?10/20; patient presents for follow-up. She was on vacation for the past 3 weeks. She states she had the last clinic visit wrap in place for 1 week and then she ?took this off. She states that she has been using her compression wraps since. She states that the wound has gotten larger. She reports pain to the wound ?site. She denies fever/chills, nausea/vomiting. She denies purulent drainage. ?10/24; patient presents for follow-up. She states she tolerated the  Kerlix/Coban wrap well. She  reports decrease in pain. She states she started Keflex. She ?denies systemic signs of infection. ?10/28; patient presents for follow-up. She has no issues or complaints today. She denies acute pain. She tolerated the wrap well. ?11/4; patient presents for follow-up. She has no issues or complaints. She denies signs of infection. ?11/11; patient presents for follow-up. She has no issues or complaints today. She denies signs of infection. She tolerated the wrap well. ?11/18; patient presents for follow-up. She states she took a shower however despite a plastic bag the wrap got wet. She currently denies signs of infection. ?12/2; patient presents for follow-up. She tolerated the wrap well. She has no issues or complaints today. ?12/12; patient presents for follow-up. She has no issues or complaints today. ?Readmission 07/23/2021 ?Ms. Gracieann Stannard is a 51 year old female with a past medical history of chronic venous insufficiency that presents to the clinic for a left lower extremity ?wound. She was seen previously in the clinic for the same wound and this was healed on 04/09/2021. Since then she has been wearing her compression ?stockings intermittently. She reports more swelling to her left lower extremity over the past month and she stopped wearing her compression stocking. ?Subsequently she developed a wound. Her Lasix has been increased and she has noticed an improvement in her swelling. She currently denies signs of ?infection. ?4/6; patient presents for follow-up. She states that the wrap stayed in place for 2 days before it rolled down and she had to take it off. She has compression ?stockings however states she could not get these on due to the swelling. She denies signs of infection. ?4/13; patient with chronic venous insufficiency that has been in this clinic previously. She has a small pair of open areas on the left lower leg in the middle of ?previous scar tissue from I believe a previous wound. Most  concerning today is how poor her edema control is above the sclerotic skin in her lower extremity. ?This is nonpitting and compatible with lymphedema. She tells me that she was using a juxta lite stocking although it was old we are apparently replacing this. ?She does not seem aware of how to adjust the tension with this and I think she is going to require 30/40 mmHg equivalent compression ?She has her arterial studies tomorrow at 2:00 ?4/25; patient presents for follow-up. ABIs done on 4/14 showed a left ABI of 1.04 and TBI of 0.99. She tolerated the compression wrap well This week. She ?states she is going to obtain compression stockings today. She has no issues or complaints today. ?5/2; patient presents for follow-up. She states her compression stockings are being delivered today. Her wound is closed today. ?Electronic Signature(s) ?Signed: 08/28/2021 9:24:58 AM By: Megan Shan DO ?Entered By: [name] on 08/28/2021 09:01:07 ?-------------------------------------------------------------------------------- ?Physical Exam Details ?Patient Name: Date of Service: ?ANTHA, NIDAY D. 08/28/2021 8:45 A M ?Medical Record Number: [id number] ?Patient Account Number: [id number] ?Date of Birth/Sex: Treating RN: ?[birth year] (51 y.o. [name] ?Primary Care Provider: [name] Other Clinician: ?Referring Provider: ?Treating Provider/Extender: [name] ?[name] ?Weeks in Treatment: 5 ?Constitutional ?respirations regular, non-labored and within target range for patient.[name] ?Cardiovascular ?2+ dorsalis pedis/posterior tibialis pulses. ?Psychiatric ?pleasant and cooperative. ?Notes ?Epithelization to the previous wound site. Surrounding skin intact. ?Electronic Signature(s) ?Signed: 08/28/2021 9:24:58 AM By: Megan Shan DO ?Entered By: [name] on 08/28/2021 09:01:38 ?-------------------------------------------------------------------------------- ?Physician Orders  Details ?Patient Name: ?Date of Service: ?AMERICUS, SCHEURICH D. 08/28/2021  8:45 A M ?Medical Record Number: [id number] ?Patient Account Number: [id number] ?Date of Birth/Sex: ?Treating RN: ?[birth year] (51 y.o. F) Onnie Boer

## 2021-08-28 NOTE — Progress Notes (Addendum)
[name], [name] ([id number]) ?Visit Report for 08/28/2021 ?Arrival Information Details ?Patient Name: Date of Service: ?[name], [name] 08/28/2021 8:45 A M ?Medical Record Number: [id number] ?Patient Account Number: [id number] ?Date of Birth/Sex: Treating RN: ?[birth year] (51 y.o. [name] ?Primary Care [name]: [name] Other Clinician: ?Referring [name]: ?Treating [name]/Extender: [name] ?[name] ?Weeks in Treatment: 5 ?Visit Information History Since Last Visit ?Added or deleted any medications: No ?Patient Arrived: Ambulatory ?Any new allergies or adverse reactions: No ?Arrival Time: 08:27 ?Had a fall or experienced change in No ?Transfer Assistance: None ?activities of daily living that may affect ?Patient Identification Verified: Yes ?risk of falls: ?Secondary Verification Process Completed: Yes ?Signs or symptoms of abuse/neglect since last visito No ?Patient Requires Transmission-Based Precautions: No ?Hospitalized since last visit: No ?Patient Has Alerts: Yes ?Implantable device outside of the clinic excluding No ?Patient Alerts: Patient on Blood Thinner ?cellular tissue based products placed in the center ?L ABI=[name] ?since last visit: ?Has Dressing in Place as Prescribed: Yes ?Has Compression in Place as Prescribed: No ?Pain Present Now: No ?Electronic Signature(s) ?Signed: 08/28/2021 5:29:35 PM By: [name] ?Entered By: [name] on 08/28/2021 08:31:42 ?-------------------------------------------------------------------------------- ?Clinic Level of Care Assessment Details ?Patient Name: Date of Service: ?[name], [name] 08/28/2021 8:45 A M ?Medical Record Number: [id number] ?Patient Account Number: [id number] ?Date of Birth/Sex: Treating RN: ?01/19/1971 (51 y.o. [name] ?Primary Care [name]: [name] Other Clinician: ?Referring [name]: ?Treating [name]/Extender: [name] ?[name] ?Weeks in Treatment: 5 ?Clinic Level  of Care Assessment Items ?TOOL 4 Quantity Score ?X- 1 0 ?Use when only an EandM is performed on FOLLOW-UP visit ?ASSESSMENTS - Nursing Assessment / Reassessment ?X- 1 10 ?Reassessment of Co-morbidities (includes updates in patient status) ?X- 1 5 ?Reassessment of Adherence to Treatment Plan ?ASSESSMENTS - Wound and Skin A ssessment / Reassessment ?X - Simple Wound Assessment / Reassessment - one wound 1 5 ?[]  - 0 ?Complex Wound Assessment / Reassessment - multiple wounds ?[]  - 0 ?Dermatologic / Skin Assessment (not related to wound area) ?ASSESSMENTS - Focused Assessment ?[]  - 0 ?Circumferential Edema Measurements - multi extremities ?[]  - 0 ?Nutritional Assessment / Counseling / Intervention ?[]  - 0 ?Lower Extremity Assessment (monofilament, tuning fork, pulses) ?[]  - 0 ?Peripheral Arterial Disease Assessment (using hand held doppler) ?ASSESSMENTS - Ostomy and/or Continence Assessment and Care ?[]  - 0 ?Incontinence Assessment and Management ?[]  - 0 ?Ostomy Care Assessment and Management (repouching, etc.) ?PROCESS - Coordination of Care ?[]  - 0 ?Simple Patient / Family Education for ongoing care ?X- 1 20 ?Complex (extensive) Patient / Family Education for ongoing care ?X- 1 10 ?Staff obtains Consents, Records, T Results / Process Orders ?est ?[]  - 0 ?Staff telephones HHA, Nursing Homes / Clarify orders / etc ?[]  - 0 ?Routine Transfer to another Facility (non-emergent condition) ?[]  - 0 ?Routine Hospital Admission (non-emergent condition) ?[]  - 0 ?New Admissions / Biomedical engineer / Ordering NPWT Apligraf, etc. ?, ?[]  - 0 ?Emergency Hospital Admission (emergent condition) ?[]  - 0 ?Simple Discharge Coordination ?[]  - 0 ?Complex (extensive) Discharge Coordination ?PROCESS - Special Needs ?[]  - 0 ?Pediatric / Minor Patient Management ?[]  - 0 ?Isolation Patient Management ?[]  - 0 ?Hearing / Language / Visual special needs ?[]  - 0 ?Assessment of Community assistance (transportation, D/C planning, etc.) ?[]  -  0 ?Additional assistance / Altered mentation ?[]  - 0 ?Support Surface(s) Assessment (bed, cushion, seat, etc.) ?INTERVENTIONS - Wound Cleansing / Measurement ?[]  - 0 ?Simple Wound Cleansing -  one wound ?[]  - 0 ?Complex Wound Cleansing - multiple wounds ?X- 1 5 ?Wound Imaging (photographs - any number of wounds) ?[]  - 0 ?Wound Tracing (instead of photographs) ?[]  - 0 ?Simple Wound Measurement - one wound ?[]  - 0 ?Complex Wound Measurement - multiple wounds ?INTERVENTIONS - Wound Dressings ?[]  - 0 ?Small Wound Dressing one or multiple wounds ?[]  - 0 ?Medium Wound Dressing one or multiple wounds ?[]  - 0 ?Large Wound Dressing one or multiple wounds ?[]  - 0 ?Application of Medications - topical ?[]  - 0 ?Application of Medications - injection ?INTERVENTIONS - Miscellaneous ?[]  - 0 ?External ear exam ?[]  - 0 ?Specimen Collection (cultures, biopsies, blood, body fluids, etc.) ?[]  - 0 ?Specimen(s) / Culture(s) sent or taken to Lab for analysis ?[]  - 0 ?Patient Transfer (multiple staff / Civil Service fast streamer / Similar devices) ?[]  - 0 ?Simple Staple / Suture removal (25 or less) ?[]  - 0 ?Complex Staple / Suture removal (26 or more) ?[]  - 0 ?Hypo / Hyperglycemic Management (close monitor of Blood Glucose) ?[]  - 0 ?Ankle / Brachial Index (ABI) - do not check if billed separately ?X- 1 5 ?Vital Signs ?Has the patient been seen at the hospital within the last three years: Yes ?Total Score: 60 ?Level Of Care: New/Established - Level 2 ?Electronic Signature(s) ?Signed: 08/28/2021 5:29:35 PM By: [name] ?Entered By: [name] on 08/28/2021 08:59:34 ?-------------------------------------------------------------------------------- ?Encounter Discharge Information Details ?Patient Name: Date of Service: ?[name], [name] 08/28/2021 8:45 A M ?Medical Record Number: [id number] ?Patient Account Number: [id number] ?Date of Birth/Sex: Treating RN: ?1971-03-14 (51 y.o. [name] ?Primary Care [name]: [name] Other  Clinician: ?Referring [name]: ?Treating [name]/Extender: [name] ?[name] ?Weeks in Treatment: 5 ?Encounter Discharge Information Items ?Discharge Condition: Stable ?Ambulatory Status: Ambulatory ?Discharge Destination: Home ?Transportation: Private Auto ?Schedule Follow-up Appointment: Yes ?Clinical Summary of Care: Provided on 08/28/2021 ?Form Type Recipient ?Paper Patient Patient ?Electronic Signature(s) ?Signed: 08/28/2021 5:29:35 PM By: [name] ?Entered By: [name] on 08/28/2021 09:00:24 ?-------------------------------------------------------------------------------- ?Lower Extremity Assessment Details ?Patient Name: Date of Service: ?[name], [name] 08/28/2021 8:45 A M ?Medical Record Number: [id number] ?Patient Account Number: [id number] ?Date of Birth/Sex: Treating RN: ?28-Nov-1970 (51 y.o. [name] ?Primary Care Cal Gindlesperger: [name] Other Clinician: ?Referring Fumio Vandam: ?Treating Jamaree Hosier/Extender: [name] ?[name] ?Weeks in Treatment: 5 ?Edema Assessment ?Assessed: [Left: Yes] [Right: No] ?Edema: [Left: Ye] [Right: s] ?Calf ?Left: Right: ?Point of Measurement: 29 cm From Medial Instep 48 cm ?Ankle ?Left: Right: ?Point of Measurement: 10 cm From Medial Instep 25 cm ?Vascular Assessment ?Pulses: ?Dorsalis Pedis ?Palpable: [Left:Yes] ?Electronic Signature(s) ?Signed: 08/28/2021 5:29:35 PM By: [name] ?Entered By: [name] on 08/28/2021 08:36:14 ?-------------------------------------------------------------------------------- ?Multi Wound Chart Details ?Patient Name: ?Date of Service: ?[name], [name] 08/28/2021 8:45 A M ?Medical Record Number: [id number] ?Patient Account Number: [id number] ?Date of Birth/Sex: ?Treating RN: ?1970-09-09 (51 y.o. [name] ?Primary Care Aspen Lawrance: [name] ?Other Clinician: ?Referring Rueben Kassim: ?Treating Chequita Mofield/Extender: [name] ?[name] ?Weeks in Treatment: 5 ?Vital  Signs ?Height(in): ?Capillary Blood Glucose(mg/dl): 145 ?Weight(lbs): ?Pulse(bpm): 73 ?Body Mass Index(BMI): ?Blood Pressure(mmHg): 137/71 ?Temperature(??F): 98.1 ?Respiratory Rate(breaths/min): 18 ?Photos: [N/A:N/

## 2021-08-29 ENCOUNTER — Other Ambulatory Visit: Payer: Self-pay

## 2021-08-29 ENCOUNTER — Ambulatory Visit: Payer: Medicaid Other

## 2021-08-29 NOTE — Patient Instructions (Signed)
Visit Information ? ?[name] was given information about Medicaid Managed Care team care coordination services as a part of their [place] Medicaid benefit. [name] verbally consented to engagement with the [facility] Managed Care team.  ? ?If you are experiencing a medical emergency, please call 911 or report to your local emergency department or urgent care.  ? ?If you have a non-emergency medical problem during routine business hours, please contact your provider's office and ask to speak with a nurse.  ? ?For questions related to your [facility], please call: [phone] or visit the homepage here: [email or web address] ? ?If you would like to schedule transportation through your [facility], please call the following number at least 2 days in advance of your appointment: [phone]. ? Rides for urgent appointments can also be made after hours by calling Member Services. ? ?Call the [place] at [phone], at any time, 24 hours a day, 7 days a week. If you are in danger or need immediate medical attention call 911. ? ?If you would like help to quit smoking, call 1-800-QUIT-NOW (667)147-9981) OR Espa?ol: 1-855-D?jelo-Ya 604-450-2059) o para m?s informaci?n haga clic aqu? or Text READY to 200-400 to register via text ? ?[name] - following are the goals we discussed in your visit today:  ? Goals Addressed   ?None ?  ? ? ? ?Social Worker will follow up in 14 days .  ? ?[name], BSW, [place] ?[name]  ?High Risk Managed Medicaid Team  ?[phone]  ? ?Following is a copy of your plan of care:  ?Care Plan : [place] of Care  ?Updates made by [name] since 08/29/2021 12:00 AM  ?  ? ?Problem: Chronic Disease Management and Care Coordination Needs   ?Priority: High  ?  ? ?Long-Range Goal:  Self-Management Plan Developed   ?Start Date: 07/02/2021  ?Expected End Date: 10/02/2021  ?Recent Progress: Not on track  ?Priority: High  ?Note:   ?Current Barriers:  ?Knowledge Deficits related to plan of care for management of back pain. lymphedema ?Care Coordination needs related to Limited support and back pain ?Chronic Disease Management support and education needs related to back pain. Lymphedema  ?08/02/21:  Patient attending PT once a week, wearing support stockings, compression wraps, una boot for left leg lymphedema ? ?RNCM Clinical Goal(s):  ?Patient will verbalize understanding of plan for management of back pain, lymphedema  as evidenced by patient report and attending PT sessions ?take all medications exactly as prescribed and will call provider for medication related questions as evidenced by patient report ?attend all scheduled medical appointments as evidenced by patient report ?continue to work with RN Care Manager to address care management and care coordination needs related to  back pain, lymphedema as evidenced by adherence to CM Team Scheduled appointments ?work with Education officer, museum to address  anxiety.  Limited support and hisory of anxiety/depression related to the management of back pain  as evidenced by review of EMR and patient or social worker report through collaboration with Consulting civil engineer, provider, and care team.  ? ?Interventions: ?Inter-disciplinary care team collaboration (see longitudinal plan of care) ?Evaluation of current treatment plan related to  self management and patient's adherence to plan as established by provider ?Collaborated with [facility] to schedule an appt for her ?Collaborated with SW for anxiety ?SW referral for anxiety ?08/29/21: BSW completed telephone outreach  with patient. She stated she was looking for more of an aide to assistance with her mother. Due to her having appointments and they have 2 dogs her mom is not always able to take both of them  out. BSW provided patient with the telephone number for [employer] [facility] [place]. BSW also informed patient she would need to speak with her moms PCP to have a prescription sent in to her insurance company for long term aide services. No other resources needed at this time.  ? ?Pain Interventions:  (Status:  New goal.) Long Term Goal ?Pain assessment performed ?Medications reviewed ?Reviewed provider established plan for pain management ?Discussed importance of adherence to all scheduled medical appointments ?Counseled on the importance of reporting any/all new or changed pain symptoms or management strategies to pain management provider ?Advised patient to report to care team affect of pain on daily activities ?Discussed use of relaxation techniques and/or diversional activities to assist with pain reduction (distraction, imagery, relaxation, massage, acupressure, TENS, heat, and cold application ?Reviewed with patient prescribed pharmacological and nonpharmacological pain relief strategies ?Assessed social determinant of health barriers ? ?Patient Goals/Self-Care Activities: ?Take all medications as prescribed ?Attend all scheduled provider appointments ?Call pharmacy for medication refills 3-7 days in advance of running out of medications ?Perform all self care activities independently  ?Perform IADL's (shopping, preparing meals, housekeeping, managing finances) independently ?Call provider office for new concerns or questions  ?Work with the Education officer, museum to address care coordination needs and will continue to work with the clinical team to address health care and disease management related needs ? ?Follow Up Plan:  The patient has been provided with contact information for the care management team and has been advised to call with any health related questions or concerns.  ?The care management team will reach out to the patient again over the next 30 days.  ?  ?  ?

## 2021-08-29 NOTE — Patient Outreach (Signed)
?Medicaid Managed Care ?Social Work Note ? ?08/29/2021 ?Name:  [name] MRN:  [id number] DOB:  04/15/1971 ? ?[name] is an 51 y.o. year old female who is a primary patient of Minteer, Deborah B, MD.  The Medicaid Managed Care Coordination team was consulted for assistance with:  Caregiver Stress ? ?[name] was given information about Medicaid Managed Care Coordination team services today. [name] Patient agreed to services and verbal consent obtained. ? ?Engaged with patient  for by telephone forinitial visit in response to referral for case management and/or care coordination services.  ? ?Assessments/Interventions:  Review of past medical history, allergies, medications, health status, including review of consultants reports, laboratory and other test data, was performed as part of comprehensive evaluation and provision of chronic care management services. ? ?SDOH: (Social Determinant of Health) assessments and interventions performed: ?08/29/21: BSW completed telephone outreach with patient. She stated she was looking for more of an aide to assistance with her mother. Due to her having appointments and they have 2 dogs her mom is not always able to take both of them out. BSW provided patient with the telephone number for [employer] [facility] [place]. BSW also informed patient she would need to speak with her moms PCP to have a prescription sent in to her insurance company for long term aide services. No other resources needed at this time ? ?Advanced Directives Status:  Not addressed in this encounter. ? ?Care Plan ?                ?Allergies  ?Allergen Reactions  ? Ace Inhibitors Rash and Other (See Comments)  ?  Make pt bleed  ? Aspirin Other (See Comments)  ?  Per patient paperwork: blood clot?  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 Hydromorphone Hives and Itching  ? Vancomycin Itching and Rash  ? Contrast Media [Iodinated Contrast Media] Hives  ? Dilaudid  [Hydromorphone Hcl] Hives  ? Lidocaine Itching  ?  Itching with Lidocaine patch reported 06/13/2021 vis telephone message.  ? ? ?Medications Reviewed Today   ? ? Reviewed by Benoit, Stephanie, PTA (Physical Therapy Assistant) on 08/23/21 at 1215  Med List Status: <None>  ? ?Medication Order Taking? Sig Documenting Provider Last Dose Status Informant  ?Accu-Chek Softclix Lancets lancets [id number] No Use as instructed  ?Patient taking differently: 1 each by Other route See admin instructions. Use as instructed  ? McClung, Angela M, PA-C Taking Active Self  ?acetaminophen (TYLENOL) 500 MG tablet [id number] No Take 1 tablet (500 mg total) by mouth every 6 (six) hours as needed.  ?Patient taking differently: Take 500 mg by mouth every 6 (six) hours as needed for mild pain.  ? Fiorito, Deborah B, MD Taking Active Self  ?albuterol (PROAIR HFA) 108 (90 Base) MCG/ACT inhaler [id number] No INHALE TWO PUFFS BY MOUTH EVERY 6 HOURS AS NEEDED FOR WHEEZING OR SHORTNESS OF BREATH  ?Patient taking differently: Inhale 2 puffs into the lungs See admin instructions. INHALE TWO PUFFS BY MOUTH EVERY 6 HOURS AS NEEDED FOR WHEEZING OR SHORTNESS OF BREATH  ? Cardy, Deborah B, MD Taking Active Self  ?amLODipine (NORVASC) 5 MG tablet [id number] No TAKE 1/2 TABLET BY MOUTH DAILY  ?Patient taking differently: Take 2.5 mg by mouth daily.  ? Betzer, Deborah B, MD Taking Active Self  ?atorvastatin (LIPITOR) 40 MG tablet [id number] No TAKE 1 TABLET BY MOUTH DAILY Berns, Deborah B, MD Taking Active   ?Blood Glucose Calibration (ACCU-CHEK GUIDE CONTROL) LIQD [id number]   No 1 each by In Vitro route daily. McClung, Angela M, PA-C Taking Active Self  ?Blood Glucose Monitoring Suppl (ACCU-CHEK GUIDE) w/Device KIT [id number] No 1 each by Does not apply route 2 (two) times daily. McClung, Angela M, PA-C Taking Active Self  ?Carboxymethylcellulose Sodium (EYE DROPS OP) [id number] No Place 1 drop into both eyes daily as needed (dry eyes). [provider] Taking Active Self  ?carvedilol (COREG) 25 MG tablet [id number]  TAKE ONE TABLET BY MOUTH TWICE A DAY WITH MEALS [place], Deborah B, MD  Active   ?cholecalciferol (VITAMIN D3) 25 MCG (1000 UNIT) tablet [id number] No Take 1 tablet (1,000 Units total) by mouth daily. Berkheimer, Deborah B, MD Taking Active Self  ?COVID-19 mRNA Vac-TriS, Pfizer, (PFIZER-BIONT COVID-19 VAC-TRIS) SUSP injection [id number] No Inject into the muscle. Snider, Cynthia, MD Taking Active   ?dapagliflozin propanediol (FARXIGA) 10 MG TABS tablet [id number] No Take 1 tablet (10 mg total) by mouth daily. Roediger, Deborah B, MD Taking Active Self  ?DULERA 200-5 MCG/ACT AERO [id number] No INHALE 2 PUFFS INTO THE LUNGS TWO TIMES A DAY  ?Patient taking differently: Inhale 2 puffs into the lungs daily.  ? Triska, Deborah B, MD Taking Active Self  ?ELIQUIS 5 MG TABS tablet [id number] No TAKE ONE TABLET BY MOUTH TWICE A DAY  ?Patient taking differently: Take 5 mg by mouth 2 (two) times daily.  ? Birchmeier, Deborah B, MD Taking Active Self  ?FEROSUL 325 (65 Fe) MG tablet [id number] No TAKE ONE TABLET BY MOUTH DAILY WITH BREAKFAST Flippen, Deborah B, MD Taking Active   ?furosemide (LASIX) 80 MG tablet [id number] No Take 1 tablet (80 mg total) by mouth daily. Take additional dose of lasix for weight gain, leg swelling Ghimire, Kuber, MD Taking Active   ?gabapentin (NEURONTIN) 100 MG capsule [id number] No TAKE 1 CAPSULE BY MOUTH THREE TIMES A DAY AS NEEDED FOR PAN  ?Patient taking differently: Take 100 mg by mouth 3 (three) times daily as needed (pain).  ? Shammas, Deborah B, MD Taking Active Self  ?glucose blood test strip [id number] No Use as instructed  ?Patient taking differently: 1 each by Other route See admin instructions. Use as instructed  ? McClung, Angela M, PA-C Taking Active Self  ?HYDROcodone-acetaminophen (NORCO/VICODIN) 5-325 MG tablet [id number] No Take 1 tablet by mouth every 12 (twelve) hours as needed for moderate pain (severe pain). Persons, Mary  Anne, PA Taking Active   ?hydrocortisone cream 1 % [id number] No Apply 1 application topically daily as needed for itching. [provider] Taking Active Self  ?hydrOXYzine (VISTARIL) 25 MG capsule [id number] No Take 1 capsule (25 mg total) by mouth at bedtime as needed.  ?Patient taking differently: Take 25 mg by mouth at bedtime as needed for anxiety or itching.  ? Mcbrearty, Deborah B, MD Taking Active Self  ?KLOR-CON M20 20 MEQ tablet [id number] No TAKE ONE TABLET BY MOUTH DAILY  ?Patient taking differently: Take 20 mEq by mouth daily.  ? Laurie, Deborah B, MD Taking Active Self  ?methocarbamol (ROBAXIN) 500 MG tablet [id number] No TAKE ONE TABLET BY MOUTH DAILY AS NEEDED FOR MUSCLE SPASMS  ?Patient taking differently: Take 500 mg by mouth See admin instructions. TAKE ONE TABLET BY MOUTH DAILY AS NEEDED FOR MUSCLE SPASMS  ? Kimberley, Deborah B, MD Taking Active Self  ?nitroGLYCERIN (NITROSTAT) 0.4 MG SL tablet [id number] No Place 1 tablet (0.4 mg total) under the tongue every 5 (five) minutes as needed for chest pain. Alekh, Kshitiz, MD Taking Active Self  ?  omeprazole (PRILOSEC) 20 MG capsule [id number] No Take 1 capsule (20 mg total) by mouth daily as needed (acid reflux). Fenton Foy, NP 05/25/2021 Expired 05/31/21 2359 Self  ?PARoxetine (PAXIL) 10 MG tablet [id number] No Take 1 tablet (10 mg total) by mouth daily. Ladell Pier, MD Taking Active   ?spironolactone (ALDACTONE) 25 MG tablet [id number]  TAKE ONE TABLET BY MOUTH DAILY Ladell Pier, MD  Active   ?valsartan (DIOVAN) 160 MG tablet [id number] No Take 1 tablet (160 mg total) by mouth daily. Leonie Man, MD Taking Active Self  ?vitamin B-12 (CYANOCOBALAMIN) 1000 MCG tablet [id number] No Take 1,000 mcg by mouth daily. [provider] Taking Active Self  ? ?  ?  ? ?  ? ? ?Patient Active Problem List  ? Diagnosis Date Noted  ? OSA (obstructive sleep apnea) 07/11/2021  ? Depression 05/28/2021  ? Encounter for care related to  Port-a-Cath 05/27/2021  ? Acute on chronic respiratory failure with hypoxia ([place]) 05/26/2021  ? Morbid obesity with BMI of 50.0-59.9, adult ([place]) 05/26/2021  ? Acute respiratory failure ([place]) 11/23/2020  ? Acute respirator

## 2021-08-30 ENCOUNTER — Other Ambulatory Visit: Payer: Self-pay | Admitting: Internal Medicine

## 2021-08-30 ENCOUNTER — Encounter (HOSPITAL_BASED_OUTPATIENT_CLINIC_OR_DEPARTMENT_OTHER): Payer: Medicaid Other | Admitting: Internal Medicine

## 2021-08-30 DIAGNOSIS — I10 Essential (primary) hypertension: Secondary | ICD-10-CM

## 2021-08-30 DIAGNOSIS — I5032 Chronic diastolic (congestive) heart failure: Secondary | ICD-10-CM

## 2021-08-30 DIAGNOSIS — L97822 Non-pressure chronic ulcer of other part of left lower leg with fat layer exposed: Secondary | ICD-10-CM | POA: Diagnosis not present

## 2021-08-30 NOTE — Progress Notes (Signed)
MELBA, ARAKI D. ([id number]) ?Visit Report for 08/30/2021 ?Arrival Information Details ?Patient Name: Date of Service: ?CAMARYN, LUMBERT D. 08/30/2021 8:45 A M ?Medical Record Number: [id number] ?Patient Account Number: [id number] ?Date of Birth/Sex: Treating RN: ?02-Jul-1970 (51 y.o. [name] ?Primary Care Kaedence Connelly: [name] Other Clinician: ?Referring Dastan Krider: ?Treating Bani Gianfrancesco/Extender: [name] ?[name] ?Weeks in Treatment: 5 ?Visit Information History Since Last Visit ?Added or deleted any medications: No ?Patient Arrived: Ambulatory ?Any new allergies or adverse reactions: No ?Arrival Time: 09:04 ?Had a fall or experienced change in No ?Transfer Assistance: None ?activities of daily living that may affect ?Patient Identification Verified: Yes ?risk of falls: ?Secondary Verification Process Completed: Yes ?Signs or symptoms of abuse/neglect since last visito No ?Patient Requires Transmission-Based Precautions: No ?Hospitalized since last visit: No ?Patient Has Alerts: Yes ?Implantable device outside of the clinic excluding No ?Patient Alerts: Patient on Blood Thinner ?cellular tissue based products placed in the center ?L ABI=[place] ?since last visit: ?Pain Present Now: No ?Electronic Signature(s) ?Signed: 08/30/2021 4:49:55 PM By: [name] ?Entered By: [name] on 08/30/2021 09:04:32 ?-------------------------------------------------------------------------------- ?Clinic Level of Care Assessment Details ?Patient Name: Date of Service: ?ARELY, TINNER D. 08/30/2021 8:45 A M ?Medical Record Number: [id number] ?Patient Account Number: [id number] ?Date of Birth/Sex: Treating RN: ?01/05/1971 (51 y.o. [name] ?Primary Care Lotoya Casella: [name] Other Clinician: ?Referring Cayle Thunder: ?Treating [place] Vierling/Extender: [name] ?[name] ?Weeks in Treatment: 5 ?Clinic Level of Care Assessment Items ?TOOL 4 Quantity Score ?X- 1 0 ?Use when only an EandM is  performed on FOLLOW-UP visit ?ASSESSMENTS - Nursing Assessment / Reassessment ?X- 1 10 ?Reassessment of Co-morbidities (includes updates in patient status) ?X- 1 5 ?Reassessment of Adherence to Treatment Plan ?ASSESSMENTS - Wound and Skin A ssessment / Reassessment ?[]  - 0 ?Simple Wound Assessment / Reassessment - one wound ?[]  - 0 ?Complex Wound Assessment / Reassessment - multiple wounds ?[]  - 0 ?Dermatologic / Skin Assessment (not related to wound area) ?ASSESSMENTS - Focused Assessment ?[]  - 0 ?Circumferential Edema Measurements - multi extremities ?[]  - 0 ?Nutritional Assessment / Counseling / Intervention ?[]  - 0 ?Lower Extremity Assessment (monofilament, tuning fork, pulses) ?[]  - 0 ?Peripheral Arterial Disease Assessment (using hand held doppler) ?ASSESSMENTS - Ostomy and/or Continence Assessment and Care ?[]  - 0 ?Incontinence Assessment and Management ?[]  - 0 ?Ostomy Care Assessment and Management (repouching, etc.) ?PROCESS - Coordination of Care ?[]  - 0 ?Simple Patient / Family Education for ongoing care ?X- 1 20 ?Complex (extensive) Patient / Family Education for ongoing care ?[]  - 0 ?Staff obtains Consents, Records, T Results / Process Orders ?est ?[]  - 0 ?Staff telephones HHA, Nursing Homes / Clarify orders / etc ?[]  - 0 ?Routine Transfer to another Facility (non-emergent condition) ?[]  - 0 ?Routine Hospital Admission (non-emergent condition) ?[]  - 0 ?New Admissions / Biomedical engineer / Ordering NPWT Apligraf, etc. ?, ?[]  - 0 ?Emergency Hospital Admission (emergent condition) ?X- 1 10 ?Simple Discharge Coordination ?[]  - 0 ?Complex (extensive) Discharge Coordination ?PROCESS - Special Needs ?[]  - 0 ?Pediatric / Minor Patient Management ?[]  - 0 ?Isolation Patient Management ?[]  - 0 ?Hearing / Language / Visual special needs ?[]  - 0 ?Assessment of Community assistance (transportation, D/C planning, etc.) ?[]  - 0 ?Additional assistance / Altered mentation ?[]  - 0 ?Support Surface(s) Assessment  (bed, cushion, seat, etc.) ?INTERVENTIONS - Wound Cleansing / Measurement ?[]  - 0 ?Simple Wound Cleansing - one wound ?[]  - 0 ?Complex Wound Cleansing - multiple wounds ?[]  - 0 ?  Wound Imaging (photographs - any number of wounds) ?[]  - 0 ?Wound Tracing (instead of photographs) ?[]  - 0 ?Simple Wound Measurement - one wound ?[]  - 0 ?Complex Wound Measurement - multiple wounds ?INTERVENTIONS - Wound Dressings ?[]  - 0 ?Small Wound Dressing one or multiple wounds ?[]  - 0 ?Medium Wound Dressing one or multiple wounds ?[]  - 0 ?Large Wound Dressing one or multiple wounds ?[]  - 0 ?Application of Medications - topical ?[]  - 0 ?Application of Medications - injection ?INTERVENTIONS - Miscellaneous ?[]  - 0 ?External ear exam ?[]  - 0 ?Specimen Collection (cultures, biopsies, blood, body fluids, etc.) ?[]  - 0 ?Specimen(s) / Culture(s) sent or taken to Lab for analysis ?[]  - 0 ?Patient Transfer (multiple staff / Civil Service fast streamer / Similar devices) ?[]  - 0 ?Simple Staple / Suture removal (25 or less) ?[]  - 0 ?Complex Staple / Suture removal (26 or more) ?[]  - 0 ?Hypo / Hyperglycemic Management (close monitor of Blood Glucose) ?[]  - 0 ?Ankle / Brachial Index (ABI) - do not check if billed separately ?X- 1 5 ?Vital Signs ?Has the patient been seen at the hospital within the last three years: Yes ?Total Score: 50 ?Level Of Care: New/Established - Level 2 ?Electronic Signature(s) ?Signed: 08/30/2021 4:49:55 PM By: [name] ?Entered By: [name] on 08/30/2021 09:13:05 ?-------------------------------------------------------------------------------- ?Encounter Discharge Information Details ?Patient Name: Date of Service: ?JHOANA, UPHAM D. 08/30/2021 8:45 A M ?Medical Record Number: [id number] ?Patient Account Number: [id number] ?Date of Birth/Sex: Treating RN: ?[birth year] (51 y.o. [name] ?Primary Care Jakobie Henslee: [name] Other Clinician: ?Referring Lequita Meadowcroft: ?Treating Jonay Hitchcock/Extender: [name] ?[name] ?Weeks in Treatment: 5 ?Encounter Discharge Information Items ?Discharge Condition: Stable ?Ambulatory Status: Ambulatory ?Discharge Destination: Home ?Transportation: Private Auto ?Schedule Follow-up Appointment: No ?Clinical Summary of Care: Patient Declined ?Electronic Signature(s) ?Signed: 08/30/2021 4:49:55 PM By: [name] ?Entered By: [name] on 08/30/2021 09:12:34 ?-------------------------------------------------------------------------------- ?Patient/Caregiver Education Details ?Patient Name: Date of Service: ?[name], Alayssa D. 5/4/2023andnbsp8:45 A M ?Medical Record Number: [id number] ?Patient Account Number: [id number] ?Date of Birth/Gender: Treating RN: ?1970/05/14 (51 y.o. [name] ?Primary Care Physician: [name] Other Clinician: ?Referring Physician: ?Treating Physician/Extender: [name] ?[name] ?Weeks in Treatment: 5 ?Education Assessment ?Education Provided To: ?Patient ?Education Topics Provided ?Venous: ?Methods: Explain/Verbal ?Responses: Return demonstration correctly, State content correctly ?Notes ?Education given on application of Juxtalite compression. ?Electronic Signature(s) ?Signed: 08/30/2021 4:49:55 PM By: [name] ?Entered By: [name] on 08/30/2021 09:12:22 ?-------------------------------------------------------------------------------- ?Vitals Details ?Patient Name: ?Date of Service: ?AMBREE, FRANCES D. 08/30/2021 8:45 A M ?Medical Record Number: [id number] ?Patient Account Number: [id number] ?Date of Birth/Sex: ?Treating RN: ?1970-11-06 (51 y.o. [name] ?Primary Care Dang Mathison: [name] ?Other Clinician: ?Referring Ethleen Lormand: ?Treating Evola Hollis/Extender: [name] ?[name] ?Weeks in Treatment: 5 ?Vital Signs ?Time Taken: 09:06 ?Temperature (??F): 98.2 ?Pulse (bpm): 76 ?Respiratory Rate (breaths/min): 18 ?Blood Pressure (mmHg): 140/70 ?Capillary Blood Glucose (mg/dl): 117 ?Reference  Range: 80 - 120 mg / dl ?Electronic Signature(s) ?Signed: 08/30/2021 4:49:55 PM By: [name] ?Entered By: [name] on 08/30/2021 09:06:54 ?

## 2021-08-30 NOTE — Progress Notes (Signed)
[name], [name] ([id number]) ?Visit Report for 08/30/2021 ?SuperBill Details ?Patient Name: Date of Service: ?[name], [name] 08/30/2021 ?Medical Record Number: [id number] ?Patient Account Number: [id number] ?Date of Birth/Sex: Treating RN: ?[birth year] (51 y.o. [name] ?Primary Care Provider: [name] Other Clinician: ?Referring Provider: ?Treating Provider/Extender: [name] ?[name] ?Weeks in Treatment: 5 ?Diagnosis Coding ?ICD-10 Codes ?Code Description ?U93.235 Non-pressure chronic ulcer of other part of left lower leg with fat layer exposed ?T73.220 Chronic venous hypertension (idiopathic) with ulcer of left lower extremity ?I89.0 Lymphedema, not elsewhere classified ?U54.270 Personal history of other venous thrombosis and embolism ?Facility Procedures ?CPT4 Code Description Modifier Quantity ?[id number] 15176 - WOUND CARE VISIT-LEV 2 EST PT 1 ?Electronic Signature(s) ?Signed: 08/30/2021 10:14:53 AM By: [name] DO ?Signed: 08/30/2021 4:49:55 PM By: [name] ?Entered By: [name] on 08/30/2021 09:13:13 ?

## 2021-08-30 NOTE — Telephone Encounter (Signed)
Last courtesy refill given 07/2021. Pt has an appt scheduled for 11/16/21 ?

## 2021-08-31 ENCOUNTER — Encounter: Payer: Self-pay | Admitting: Physician Assistant

## 2021-08-31 ENCOUNTER — Ambulatory Visit (INDEPENDENT_AMBULATORY_CARE_PROVIDER_SITE_OTHER): Payer: Medicaid Other | Admitting: Physician Assistant

## 2021-08-31 DIAGNOSIS — M5414 Radiculopathy, thoracic region: Secondary | ICD-10-CM | POA: Diagnosis not present

## 2021-08-31 NOTE — Addendum Note (Signed)
Addended by: Georgette Dover on: 08/31/2021 03:41 PM ? ? Modules accepted: Orders ? ?

## 2021-08-31 NOTE — Progress Notes (Signed)
? ?Office Visit Note ?  ?Patient: [name]           ?Date of Birth: January 28, 1971           ?MRN: [id number] ?Visit Date: 08/31/2021 ?             ?Requested by: Ladell Pier, MD ?[address] ?[address] ?[place],  [place] [id number] ?PCP: Ladell Pier, MD ? ?Chief Complaint  ?Patient presents with  ? Middle Back - Follow-up  ? Lower Back - Follow-up  ? ? ? ? ?HPI: ?Patient presents in follow-up for her thoracic back pain.  She last saw me a month ago.  She had had x-rays of her thoracic spine which showed some degenerative changes.  She has been in physical therapy.  She feels like she is gotten slightly better but not made as much progress that she would like.  She denies any loss of strength in her arms or legs but sometimes feels like they are tired.  ? ?Assessment & Plan: ?Visit Diagnoses: No diagnosis found. ? ?Plan: We will go forward with an MRI of her thoracic spine with follow-up afterwards in the meantime she should complete her physical therapy.  We will follow-up once the MRI is completed ? ?Follow-Up Instructions: No follow-ups on file.  ? ?Ortho Exam ? ?Patient is alert, oriented, no adenopathy, well-dressed, normal affect, normal respiratory effort. ?Examination she has some tenderness over the mid thoracic lower thoracic spine.  Has mild tenderness in the lumbar spine though most of her symptomology is more in the thoracic spine.  She has good strength and it is equal in her upper and lower extremities no numbness or tingling she does have pain with bending forward ? ?Imaging: ?No results found. ?No images are attached to the encounter. ? ?Labs: ?Lab Results  ?Component Value Date  ? HGBA1C 6.5 (H) 11/24/2020  ? HGBA1C 5.7 04/07/2020  ? ESRSEDRATE 53 (H) 05/29/2021  ? ESRSEDRATE 56 (H) 05/28/2021  ? ESRSEDRATE 53 (H) 05/04/2020  ? CRP 6.3 (H) 05/29/2021  ? CRP 8.0 (H) 05/28/2021  ? REPTSTATUS 02/21/2021 FINAL 02/15/2021  ? GRAMSTAIN  02/15/2021  ?  NO SQUAMOUS EPITHELIAL CELLS  SEEN ?FEW WBC SEEN ?NO ORGANISMS SEEN ?  ? CULT  02/15/2021  ?  RARE ENTEROCOCCUS FAECALIS ?WITHIN MIXED ORGANISMS ?NO ANAEROBES ISOLATED ?Performed at [facility], [place] [address] [place], [place] [address] ?  ? LABORGA ENTEROCOCCUS FAECALIS 02/15/2021  ? ? ? ?Lab Results  ?Component Value Date  ? ALBUMIN 3.5 05/29/2021  ? ALBUMIN 3.5 05/28/2021  ? ALBUMIN 3.4 (L) 05/27/2021  ? ? ?Lab Results  ?Component Value Date  ? MG 1.9 05/29/2021  ? MG 2.0 05/27/2021  ? MG 2.2 11/27/2020  ? ?No results found for: VD25OH ? ?No results found for: PREALBUMIN ? ?  Latest Ref Rng & Units 05/29/2021  ?  3:35 AM 05/28/2021  ?  8:51 AM 05/27/2021  ?  3:55 AM  ?CBC EXTENDED  ?WBC 4.0 - 10.5 K/uL 5.8   4.7   6.0    ?RBC 3.87 - 5.11 MIL/uL 4.18   4.31   3.59    ?Hemoglobin 12.0 - 15.0 g/dL 11.3   11.6   9.9    ?HCT 36.0 - 46.0 % 37.2   39.0   33.5    ?Platelets 150 - 400 K/uL 140   129   133    ?NEUT# 1.7 - 7.7 K/uL 4.4  3.4   4.6    ?Lymph# 0.7 - 4.0 K/uL 0.7   0.6   0.6    ? ? ? ?There is no height or weight on file to calculate BMI. ? ?Orders:  ?No orders of the defined types were placed in this encounter. ? ?No orders of the defined types were placed in this encounter. ? ? ? Procedures: ?No procedures performed ? ?Clinical Data: ?No additional findings. ? ?ROS: ? ?All other systems negative, except as noted in the HPI. ?Review of Systems ? ?Objective: ?Vital Signs: There were no vitals taken for this visit. ? ?Specialty Comments:  ?No specialty comments available. ? ?PMFS History: ?Patient Active Problem List  ? Diagnosis Date Noted  ? OSA (obstructive sleep apnea) 07/11/2021  ? Depression 05/28/2021  ? Encounter for care related to Port-a-Cath 05/27/2021  ? Acute on chronic respiratory failure with hypoxia ([place]) 05/26/2021  ? Morbid obesity with BMI of 50.0-59.9, adult ([place]) 05/26/2021  ? Acute respiratory failure ([place]) 11/23/2020  ? Acute respiratory failure with hypoxia ([place]) 11/22/2020  ? Fever 11/22/2020  ? Bilateral lower  extremity edema 11/22/2020  ? History of DVT (deep vein thrombosis) 11/22/2020  ? Asthma, chronic, moderate persistent, with acute exacerbation 11/22/2020  ? Lower limb ulcer, calf, left, limited to breakdown of skin ([place]) 06/09/2020  ? Lymphedema 06/09/2020  ? Iron deficiency anemia due to chronic blood loss 05/10/2020  ? 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 Venous insufficiency 04/13/2020  ? S/P insertion of IVC (inferior vena caval) filter 04/13/2020  ? Chronic anemia 04/13/2020  ? Essential hypertension 04/13/2020  ? (HFpEF) heart failure with preserved ejection fraction (HCC) -> although echo suggests normal diastolic parameters with normal left atrial size 04/13/2020  ? CAD (coronary artery disease) 11/17/2019  ? ?Past Medical History:  ?Diagnosis Date  ? (HFpEF) heart failure with preserved ejection fraction (HCC) -> although echo suggests normal diastolic parameters with normal left atrial size 04/13/2020  ? Cellulitis   ? Coronary artery disease involving native coronary artery without angina pectoris   ? PT STATES - HAD A CARDIAC CATH - NOT TOLD SHE HAD CAD -> week note from [place] indicates history of MI (patient cannot corroborate  ? Diabetes mellitus without complication ([place])   ? DVT (deep venous thrombosis) ([place]) 09/17/2017  ? 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 Generalized anxiety disorder   ? H/O gastric ulcer 11/16/2018  ? History of small bowel obstruction   ? In childhood  ? Hypertension   ? Iron deficiency anemia due to chronic blood loss   ? Previously been followed by hematology for iron infusion every 2 weeks and as of 2019; full GI evaluation including capsule endoscopy negative.  ? Morbid obesity due to excess calories ([place])   ? OSA (obstructive sleep apnea) 07/11/2021  ? Sleep study April 06, 2019 (Dr. Halford Chessman): Moderate OSA (AHI of 24.4 and SPO2 low of 77%).  Majority events during REM  sleep.-recommend CPAP, oral appliance or surgical. Reviewed sleep hygiene.  Avoid sedatives.    ? Osteoarthritis of left knee   ? Prediabetes   ? Small bowel obstruction ([place])   ? as a child  ? Speech impediment   ? Stutter / stammer  ?  ?Family History  ?Problem Relation Age of Onset  ? Diabetes Mellitus II Mother   ? COPD Father   ? Diabetes Father   ? Diabetes Mellitus II Maternal  Grandmother   ? Breast cancer Paternal Grandfather   ?  ?Past Surgical History:  ?Procedure Laterality Date  ? ABDOMINAL WALL DEFECT REPAIR  1970  ? IR CV LINE INJECTION  10/24/2020  ? IR REMOVAL TUN ACCESS W/ PORT W/O FL MOD SED  05/28/2021  ? IVC FILTER INSERTION  2017  ? Lower Extremity Venous Duplex  06/23/2020  ? No evidence of DVT or superficial thrombosis bilaterally.  No evidence of deep venous insufficiency bilaterally.  No evidence of SSV reflux.  Right GSV in the calf has reflux, no reflux in L GSV.;  Repeated in July 2020-no DVT  ? OOPHORECTOMY  1996  ? OOPHORECTOMY  1997  ? PORTACATH PLACEMENT  2014  ? TRANSTHORACIC ECHOCARDIOGRAM  11/25/2020  ? EF 55 to 60%.  Normal LV function.  No RWM A.  Mild LVH.  GR 1 DD.  Normal PAP.  Normal aortic and mitral valve.  Mildly elevated RAP  ~8 mmHg.  ? ?Social History  ? ?Occupational History  ? Occupation: unemployed on [place]  ?Tobacco Use  ? Smoking status: Former  ? Smokeless tobacco: Never  ?Vaping Use  ? Vaping Use: Never used  ?Substance and Sexual Activity  ? Alcohol use: Not Currently  ? Drug use: Not Currently  ? Sexual activity: Not Currently  ? ? ? ? ? ?

## 2021-09-01 NOTE — Therapy (Incomplete)
?OUTPATIENT PHYSICAL THERAPY TREATMENT NOTE ? ? ?Patient Name: [name] ?MRN: [id number] ?DOB:16-Dec-1970, 51 y.o., female ?Today's Date: 09/01/2021 ? ?PCP: Ladell Pier, MD ?REFERRING PROVIDER: Persons, Bevely Palmer, PA ? ? ? ? ? ? ?Past Medical History:  ?Diagnosis Date  ? (HFpEF) heart failure with preserved ejection fraction (HCC) -> although echo suggests normal diastolic parameters with normal left atrial size 04/13/2020  ? Cellulitis   ? Coronary artery disease involving native coronary artery without angina pectoris   ? PT STATES - HAD A CARDIAC CATH - NOT TOLD SHE HAD CAD -> week note from [place] indicates history of MI (patient cannot corroborate  ? Diabetes mellitus without complication ([place])   ? DVT (deep venous thrombosis) ([place]) 09/17/2017  ? 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 Generalized anxiety disorder   ? H/O gastric ulcer 11/16/2018  ? History of small bowel obstruction   ? In childhood  ? Hypertension   ? Iron deficiency anemia due to chronic blood loss   ? Previously been followed by hematology for iron infusion every 2 weeks and as of 2019; full GI evaluation including capsule endoscopy negative.  ? Morbid obesity due to excess calories ([place])   ? OSA (obstructive sleep apnea) 07/11/2021  ? Sleep study April 06, 2019 (Dr. Halford Chessman): Moderate OSA (AHI of 24.4 and SPO2 low of 77%).  Majority events during REM sleep.-recommend CPAP, oral appliance or surgical. Reviewed sleep hygiene.  Avoid sedatives.    ? Osteoarthritis of left knee   ? Prediabetes   ? Small bowel obstruction ([place])   ? as a child  ? Speech impediment   ? Stutter / stammer  ? ?Past Surgical History:  ?Procedure Laterality Date  ? ABDOMINAL WALL DEFECT REPAIR  1970  ? IR CV LINE INJECTION  10/24/2020  ? IR REMOVAL TUN ACCESS W/ PORT W/O FL MOD SED  05/28/2021  ? IVC FILTER INSERTION  2017  ? Lower Extremity Venous Duplex  06/23/2020  ? No evidence of DVT or superficial thrombosis bilaterally.   No evidence of deep venous insufficiency bilaterally.  No evidence of SSV reflux.  Right GSV in the calf has reflux, no reflux in L GSV.;  Repeated in July 2020-no DVT  ? OOPHORECTOMY  1996  ? OOPHORECTOMY  1997  ? PORTACATH PLACEMENT  2014  ? TRANSTHORACIC ECHOCARDIOGRAM  11/25/2020  ? EF 55 to 60%.  Normal LV function.  No RWM A.  Mild LVH.  GR 1 DD.  Normal PAP.  Normal aortic and mitral valve.  Mildly elevated RAP  ~8 mmHg.  ? ?Patient Active Problem List  ? Diagnosis Date Noted  ? OSA (obstructive sleep apnea) 07/11/2021  ? Depression 05/28/2021  ? Encounter for care related to Port-a-Cath 05/27/2021  ? Acute on chronic respiratory failure with hypoxia ([place]) 05/26/2021  ? Morbid obesity with BMI of 50.0-59.9, adult ([place]) 05/26/2021  ? Acute respiratory failure ([place]) 11/23/2020  ? Acute respiratory failure with hypoxia ([place]) 11/22/2020  ? Fever 11/22/2020  ? Bilateral lower extremity edema 11/22/2020  ? History of DVT (deep vein thrombosis) 11/22/2020  ? Asthma, chronic, moderate persistent, with acute exacerbation 11/22/2020  ? Lower limb ulcer, calf, left, limited to breakdown of skin ([place]) 06/09/2020  ? Lymphedema 06/09/2020  ? Iron deficiency anemia due to chronic blood loss 05/10/2020  ? 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 Venous insufficiency 04/13/2020  ? S/P insertion of  IVC (inferior vena caval) filter 04/13/2020  ? Chronic anemia 04/13/2020  ? Essential hypertension 04/13/2020  ? (HFpEF) heart failure with preserved ejection fraction (HCC) -> although echo suggests normal diastolic parameters with normal left atrial size 04/13/2020  ? CAD (coronary artery disease) 11/17/2019  ? ? ?THERAPY DIAG:  ?No diagnosis found. ? ?REFERRING DIAG: Referral diagnosis: Acute right-sided low back pain with sciatica, sciatica laterality unspecified [M54.40] ? ?PERTINENT HISTORY: DVT (L calf, chronic), long term use of blood thinners, Asthma, morbid  obesity, lymphedema, non healing wound L leg, DM TII, CHF (monitor during supine activities) ? ?PRECAUTIONS/RESTRICTIONS:  ? ?Fall ? ?SUBJECTIVE: *** Patient reports that she has been doing her home exercises. ? ?Pain:  ?Are you having pain? Yes ?Pain location: upper tx to lower lumbar spine ?NPRS scale:  ?current ***/10  ?Aggravating factors: movement ?Relieving factors: sitting down (helps for short periods)       ?Pain description: constant, sharp, and aching ?Stage: Chronic ? ?OBJECTIVE: (taken on eval unless noted) ? ?MUSCLE LENGTH: ?Hamstrings: Right moderate restriction; Left no restriction ?ASLR: Right ASLR significantly reduced compared to PSLR ; Left ASLR significantly reduced compared to PSLR  ?  ?  ?LUMBAR AROM ?  ?AROM AROM  ?07/20/2021  ?Flexion WNL, w/ concordant pain  ?Extension limited by 25%, w/ concordant pain  ?Right lateral flexion limited by 25%, w/ concordant pain  ?Left lateral flexion limited by 50%, w/ concordant pain  ?Right rotation limited by 25%, w/ concordant pain  ?Left rotation limited by 25%, w/ concordant pain  ?  ?(Blank rows = not tested) ?  ?  ?LE MMT: ?  ?MMT Right ?07/20/2021 Left ?07/20/2021  ?Hip flexion (L2, L3)      ?Knee extension (L3)      ?Knee flexion      ?Hip abduction      ?Hip extension      ?Hip external rotation      ?Hip internal rotation      ?Hip adduction      ?Ankle dorsiflexion (L4)      ?Ankle plantarflexion (S1)      ?Ankle inversion      ?Ankle eversion      ?Great Toe ext (L5)      ?Grossly 3+ 3+  ?  ?(Blank rows = not tested, score listed is out of 5 possible points.  N = WNL, D = diminished, C = clear for gross weakness with myotome testing, * = concordant pain with testing) ?  ?Functional Tests ?  ?Eval (07/20/2021)      ?Sustained supine bridge (dominant leg extended at 120'', if reached): 0'' (norm 170'')      ?40'' STS: 9x  UE used? Y      ?       ?       ?       ?       ?       ?       ?       ?       ?       ?       ?       ?       ? ? ?ASTERISK  SIGNS ?  ?  ?Asterisk Signs Eval (07/20/2021)  4/11          ?R HS length Mod limitation            ?ASLR vs PSLR Significant deficit            ?  pain 10/10  6/10          ?STS 9x w/ UE            ?Bridge endurance unable  very small ROM          ?  ?  ?HOME EXERCISE PROGRAM: ?Access Code: [email or web address] ?URL: [email or web address] ?Date: 07/20/2021 ?Prepared by: Shearon Balo ?  ?Exercises ?- Seated Hip Abduction with Pelvic Floor Contraction and Resistance Loop  - 1 x daily - 7 x weekly - 3 sets - 10 reps ?- Seated Long Arc Quad  - 1 x daily - 7 x weekly - 3 sets - 10 reps ?- Seated Isometric Hip Adduction with Pelvic Floor Contraction  - 1 x daily - 7 x weekly - 3 sets - 10 reps ?- Seated March with Resistance  - 1 x daily - 7 x weekly - 3 sets - 10 reps ? ?TREATMENT 5/9: ?Therapeutic Exercise: ?- nu-step L5 91m while taking subjective and planning session with patient ?- Seated horizontal abduction GTB 2x10 ?- Seated diagonals GTB x10 BIL ?- LAQ 2x10 BIL ?- Seated clamshells 3x10 ?- Seated hamstring stretch 2x60" BIL ?- STS - 2x5 ?- seated row - GTB - 2x10 ?- seated marching 3x10 BIL ?- bridge? ?- SLR? ? ?TREATMENT 4/27: ?Therapeutic Exercise: ?- nu-step L5 65m while taking subjective and planning session with patient ?- Seated horizontal abduction GTB 2x10 ?- Seated diagonals GTB x10 BIL ?- LAQ 2x10 BIL ?- Seated clamshells 3x10 ?- Seated hamstring stretch 2x60" BIL ?- STS - 2x5 ?- seated row - GTB - 2x10 ?- seated marching 3x10 BIL ? ?TREATMENT 4/18: ?Therapeutic Exercise: ?- nu-step L5 108m while taking subjective and planning session with patient ?- Seated horizontal abduction GTB 2x10 ?- Seated diagonals GTB x10 BIL ?- LAQ 2x10 BIL ?- Seated hamstring stretch 2x60" BIL ?- SLR from foam roller - partial ROM - 1x7 ea 1x5 ea ?- bridge - partial ROM 2x5  ?- STS - 2x5 ?- seated row - GTB - 2x10 ?- seated shoulder ext - GTB- x10 (2nd set terminated due to increase in pain) ?- seated marching 2x10  BIL ? ?TREATMENT 4/11: ? ?Therapeutic Exercise: ?- nu-step L5 66m while taking subjective and planning session with patient ?- LTR - 20x ?- L and R HS stretch - 60''x3 ?- SLR from foam roller - partial ROM - 2x7 ea ?- br

## 2021-09-04 ENCOUNTER — Ambulatory Visit: Payer: Medicaid Other | Attending: Physician Assistant

## 2021-09-04 DIAGNOSIS — M546 Pain in thoracic spine: Secondary | ICD-10-CM | POA: Diagnosis present

## 2021-09-04 DIAGNOSIS — G8929 Other chronic pain: Secondary | ICD-10-CM | POA: Insufficient documentation

## 2021-09-04 DIAGNOSIS — R2689 Other abnormalities of gait and mobility: Secondary | ICD-10-CM | POA: Insufficient documentation

## 2021-09-04 DIAGNOSIS — M6281 Muscle weakness (generalized): Secondary | ICD-10-CM | POA: Diagnosis present

## 2021-09-04 DIAGNOSIS — M545 Low back pain, unspecified: Secondary | ICD-10-CM | POA: Diagnosis present

## 2021-09-04 NOTE — Therapy (Signed)
?OUTPATIENT PHYSICAL THERAPY TREATMENT NOTE ? ? ?Patient Name: [name] ?MRN: [id number] ?DOB:November 05, 1970, 51 y.o., female ?Today's Date: 09/04/2021 ? ?PCP: Ladell Pier, MD ?REFERRING PROVIDER: Persons, Bevely Palmer, PA ? ? PT End of Session - 09/04/21 1523   ? ? Visit Number [id number]   ? Date for PT Re-Evaluation 09/14/21   ? Authorization Type [employer] MCD   ? PT Start Time 1525   ? PT Stop Time 0786   ? PT Time Calculation (min) 39 min   ? Activity Tolerance Patient tolerated treatment well;Patient limited by pain;Patient limited by fatigue   ? Behavior During Therapy [facility] for tasks assessed/performed   ? ?  ?  ? ?  ? ? ? ? ?Past Medical History:  ?Diagnosis Date  ? (HFpEF) heart failure with preserved ejection fraction (HCC) -> although echo suggests normal diastolic parameters with normal left atrial size 04/13/2020  ? Cellulitis   ? Coronary artery disease involving native coronary artery without angina pectoris   ? PT STATES - HAD A CARDIAC CATH - NOT TOLD SHE HAD CAD -> week note from [place] indicates history of MI (patient cannot corroborate  ? Diabetes mellitus without complication ([place])   ? DVT (deep venous thrombosis) ([place]) 09/17/2017  ? 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 Generalized anxiety disorder   ? H/O gastric ulcer 11/16/2018  ? History of small bowel obstruction   ? In childhood  ? Hypertension   ? Iron deficiency anemia due to chronic blood loss   ? Previously been followed by hematology for iron infusion every 2 weeks and as of 2019; full GI evaluation including capsule endoscopy negative.  ? Morbid obesity due to excess calories ([place])   ? OSA (obstructive sleep apnea) 07/11/2021  ? Sleep study April 06, 2019 (Dr. Halford Chessman): Moderate OSA (AHI of 24.4 and SPO2 low of 77%).  Majority events during REM sleep.-recommend CPAP, oral appliance or surgical. Reviewed sleep hygiene.  Avoid sedatives.    ? Osteoarthritis of left knee   ? Prediabetes   ? Small bowel  obstruction ([place])   ? as a child  ? Speech impediment   ? Stutter / stammer  ? ?Past Surgical History:  ?Procedure Laterality Date  ? ABDOMINAL WALL DEFECT REPAIR  1970  ? IR CV LINE INJECTION  10/24/2020  ? IR REMOVAL TUN ACCESS W/ PORT W/O FL MOD SED  05/28/2021  ? IVC FILTER INSERTION  2017  ? Lower Extremity Venous Duplex  06/23/2020  ? No evidence of DVT or superficial thrombosis bilaterally.  No evidence of deep venous insufficiency bilaterally.  No evidence of SSV reflux.  Right GSV in the calf has reflux, no reflux in L GSV.;  Repeated in July 2020-no DVT  ? OOPHORECTOMY  1996  ? OOPHORECTOMY  1997  ? PORTACATH PLACEMENT  2014  ? TRANSTHORACIC ECHOCARDIOGRAM  11/25/2020  ? EF 55 to 60%.  Normal LV function.  No RWM A.  Mild LVH.  GR 1 DD.  Normal PAP.  Normal aortic and mitral valve.  Mildly elevated RAP  ~8 mmHg.  ? ?Patient Active Problem List  ? Diagnosis Date Noted  ? OSA (obstructive sleep apnea) 07/11/2021  ? Depression 05/28/2021  ? Encounter for care related to Port-a-Cath 05/27/2021  ? Acute on chronic respiratory failure with hypoxia ([place]) 05/26/2021  ? Morbid obesity with BMI of 50.0-59.9, adult ([place]) 05/26/2021  ? Acute respiratory failure ([place]) 11/23/2020  ? Acute respiratory failure with hypoxia ([place])  11/22/2020  ? Fever 11/22/2020  ? Bilateral lower extremity edema 11/22/2020  ? History of DVT (deep vein thrombosis) 11/22/2020  ? Asthma, chronic, moderate persistent, with acute exacerbation 11/22/2020  ? Lower limb ulcer, calf, left, limited to breakdown of skin ([place]) 06/09/2020  ? Lymphedema 06/09/2020  ? Iron deficiency anemia due to chronic blood loss 05/10/2020  ? 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 Venous insufficiency 04/13/2020  ? S/P insertion of IVC (inferior vena caval) filter 04/13/2020  ? Chronic anemia 04/13/2020  ? Essential hypertension 04/13/2020  ? (HFpEF) heart failure with preserved ejection fraction (HCC) ->  although echo suggests normal diastolic parameters with normal left atrial size 04/13/2020  ? CAD (coronary artery disease) 11/17/2019  ? ? ?THERAPY DIAG:  ?Chronic low back pain, unspecified back pain laterality, unspecified whether sciatica present ? ?Pain in thoracic spine ? ?Muscle weakness ? ?Other abnormalities of gait and mobility ? ?REFERRING DIAG: Referral diagnosis: Acute right-sided low back pain with sciatica, sciatica laterality unspecified [M54.40] ? ?PERTINENT HISTORY: DVT (L calf, chronic), long term use of blood thinners, Asthma, morbid obesity, lymphedema, non healing wound L leg, DM TII, CHF (monitor during supine activities) ? ?PRECAUTIONS/RESTRICTIONS:  ? ?Fall ? ?SUBJECTIVE: I had a back spasm on Saturday that lasted 30 minutes and I'm still hurting pretty bad. My mom put icy hot on my back and it helped a little bit. ? ?Pain:  ?Are you having pain? Yes ?Pain location: upper tx to lower lumbar spine ?NPRS scale:  ?current 9/10  ?Aggravating factors: movement ?Relieving factors: sitting down (helps for short periods)       ?Pain description: constant, sharp, and aching ?Stage: Chronic ? ?OBJECTIVE: (taken on eval unless noted) ? ?MUSCLE LENGTH: ?Hamstrings: Right moderate restriction; Left no restriction ?ASLR: Right ASLR significantly reduced compared to PSLR ; Left ASLR significantly reduced compared to PSLR  ?  ?  ?LUMBAR AROM ?  ?AROM AROM  ?07/20/2021  ?Flexion WNL, w/ concordant pain  ?Extension limited by 25%, w/ concordant pain  ?Right lateral flexion limited by 25%, w/ concordant pain  ?Left lateral flexion limited by 50%, w/ concordant pain  ?Right rotation limited by 25%, w/ concordant pain  ?Left rotation limited by 25%, w/ concordant pain  ?  ?(Blank rows = not tested) ?  ?  ?LE MMT: ?  ?MMT Right ?07/20/2021 Left ?07/20/2021  ?Hip flexion (L2, L3)      ?Knee extension (L3)      ?Knee flexion      ?Hip abduction      ?Hip extension      ?Hip external rotation      ?Hip internal  rotation      ?Hip adduction      ?Ankle dorsiflexion (L4)      ?Ankle plantarflexion (S1)      ?Ankle inversion      ?Ankle eversion      ?Great Toe ext (L5)      ?Grossly 3+ 3+  ?  ?(Blank rows = not tested, score listed is out of 5 possible points.  N = WNL, D = diminished, C = clear for gross weakness with myotome testing, * = concordant pain with testing) ?  ?Functional Tests ?  ?Eval (07/20/2021)      ?Sustained supine bridge (dominant leg extended at 120'', if reached): 0'' (norm 170'')      ?70'' STS: 9x  UE used? Y      ?       ?       ?       ?       ?       ?       ?       ?       ?       ?       ?       ?       ? ? ?  ASTERISK SIGNS ?  ?  ?Asterisk Signs Eval (07/20/2021)  4/11  09/04/2021        ?R HS length Mod limitation            ?ASLR vs PSLR Significant deficit            ?pain 10/10  6/10  9/10        ?STS 9x w/ UE    12x w/UE        ?Bridge endurance unable  very small ROM          ?  ?  ?HOME EXERCISE PROGRAM: ?Access Code: [email or web address] ?URL: [email or web address] ?Date: 07/20/2021 ?Prepared by: Shearon Balo ?  ?Exercises ?- Seated Hip Abduction with Pelvic Floor Contraction and Resistance Loop  - 1 x daily - 7 x weekly - 3 sets - 10 reps ?- Seated Long Arc Quad  - 1 x daily - 7 x weekly - 3 sets - 10 reps ?- Seated Isometric Hip Adduction with Pelvic Floor Contraction  - 1 x daily - 7 x weekly - 3 sets - 10 reps ?- Seated March with Resistance  - 1 x daily - 7 x weekly - 3 sets - 10 reps ? ?TREATMENT 09/04/2021: ?Therapeutic Exercise: ?- nu-step L5 24m while taking subjective and planning session with patient ?- 30" STS - 12 reps with UE on thighs ?- Seated horizontal abduction GTB 2x10 ?- Seated diagonals GTB x10 BIL ?- LAQ 2x10 BIL ?- Seated clamshells 3x10 ?- Seated hamstring stretch 2x60" BIL ?- seated row - GTB - 2x10 ?- seated marching 3x10 BIL ? ? ?TREATMENT 4/27: ?Therapeutic Exercise: ?- nu-step L5 24m while taking subjective and planning session with patient ?- Seated  horizontal abduction GTB 2x10 ?- Seated diagonals GTB x10 BIL ?- LAQ 2x10 BIL ?- Seated clamshells 3x10 ?- Seated hamstring stretch 2x60" BIL ?- STS - 2x5 ?- seated row - GTB - 2x10 ?- seated marching 3x10 BIL ? ?TREATMENT 4/18: ?T

## 2021-09-06 ENCOUNTER — Other Ambulatory Visit: Payer: Self-pay | Admitting: Obstetrics and Gynecology

## 2021-09-06 NOTE — Patient Instructions (Signed)
Hi [name] to speak with you today-have a nice day!! ? ?[name] was given information about Medicaid Managed Care team care coordination services as a part of their [place] Medicaid benefit. [name] verbally consented to engagement with the [facility] Managed Care team.  ? ?If you are experiencing a medical emergency, please call 911 or report to your local emergency department or urgent care.  ? ?If you have a non-emergency medical problem during routine business hours, please contact your provider's office and ask to speak with a nurse.  ? ?For questions related to your [facility], please call: [phone] or visit the homepage here: [email or web address] ? ?If you would like to schedule transportation through your [facility], please call the following number at least 2 days in advance of your appointment: [phone]. ? Rides for urgent appointments can also be made after hours by calling Member Services. ? ?Call the [place] at [phone], at any time, 24 hours a day, 7 days a week. If you are in danger or need immediate medical attention call 911. ? ?If you would like help to quit smoking, call 1-800-QUIT-NOW 8547701174) OR Espa?ol: 1-855-D?jelo-Ya [phone]) o para m?s informaci?n haga clic aqu? or Text READY to 200-400 to register via text ? ?Ms. [name] - following are the goals we discussed in your visit today:  ? Goals Addressed   ? ?Long-Range Goal: Establish Plan of Care for Chronic Disease Management Needs   ?Start Date: 07/02/2021  ?Expected End Date: 10/02/2021  ?Priority: High  ?Note:   ?Timeframe:  Long-Range Goal ?Priority:  High ?Start Date:     07/02/21                        ?Expected End Date:     ongoing                 ? ?Follow Up Date 10/09/21 ?  ?- schedule appointment for flu shot ?- schedule appointment for  vaccines needed due to my age or health ?- schedule recommended health tests (blood work, mammogram, colonoscopy, pap test) ?- schedule and keep appointment for annual check-up  ?  ?Why is this important?   ?Screening tests can find diseases early when they are easier to treat.  ?Your doctor or nurse will talk with you about which tests are important for you.  ?Getting shots for common diseases like the flu and shingles will help prevent them.   ? 09/05/21:  Patient attending PT once a week, has MRI scheduled for 09/17/21  ? ?Patient verbalizes understanding of instructions and care plan provided today and agrees to view in [place]. Active MyChart status confirmed with patient.   ? ?The Managed Medicaid care management team will reach out to the patient again over the next 30 days.  ?The  Patient  has been provided with contact information for the Managed Medicaid care management team and has been advised to call with any health related questions or concerns.  ? ?[name], BSN ?[facility] ?Care Management Coordinator - Managed Medicaid High Risk ?[phone] ?  ?Following is a copy of your plan of care:  ?Care Plan : [place] of Care  ?Updates made by [name], RN since 09/06/2021 12:00 AM  ?  ? ?Problem: Chronic Disease Management and Care Coordination Needs   ?Priority: High  ?  ? ?  Long-Range Goal: Self-Management Plan Developed   ?Start Date: 07/02/2021  ?Expected End Date: 01/07/2022  ?This Visit's Progress: Not on track  ?Recent Progress: Not on track  ?Priority: High  ?Note:   ?Current Barriers:  ?Knowledge Deficits related to plan of care for management of back pain. lymphedema ?Care Coordination needs related to Limited support and back pain ?Chronic Disease Management support and education needs related to back pain. Lymphedema  ?09/06/21:  Patient attending PT once a week for back and doing home exercises-has MRI scheduled for 09/17/21.  States left leg is  improved-wearing compression wrap every day ? ?RNCM Clinical Goal(s):  ?Patient will verbalize understanding of plan for management of back pain, lymphedema  as evidenced by patient report and attending PT sessions ?take all medications exactly as prescribed and will call provider for medication related questions as evidenced by patient report ?attend all scheduled medical appointments as evidenced by patient report ?continue to work with RN Care Manager to address care management and care coordination needs related to  back pain, lymphedema as evidenced by adherence to CM Team Scheduled appointments ?work with Education officer, museum to address  anxiety.  Limited support and hisory of anxiety/depression related to the management of back pain  as evidenced by review of EMR and patient or social worker report through collaboration with Consulting civil engineer, provider, and care team.  ? ?Interventions: ?Inter-disciplinary care team collaboration (see longitudinal plan of care) ?Evaluation of current treatment plan related to  self management and patient's adherence to plan as established by provider ?Collaborated with [name] to schedule an appt for her ?Collaborated with SW for anxiety ?SW referral for anxiety ?08/29/21: BSW completed telephone outreach with patient. She stated she was looking for more of an aide to assistance with her mother. Due to her having appointments and they have 2 dogs her mom is not always able to take both of them out. BSW provided patient with the telephone number for [employer] [facility] [place]. BSW also informed patient she would need to speak with her moms PCP to have a prescription sent in to her insurance company for long term aide services. No other resources needed at this time.  ? ?Pain Interventions:  (Status:  New goal.) Long Term Goal ?Pain assessment performed ?Medications reviewed ?Reviewed provider established plan for pain management ?Discussed importance of adherence to  all scheduled medical appointments ?Counseled on the importance of reporting any/all new or changed pain symptoms or management strategies to pain management provider ?Advised patient to report to care team affect of pain on daily activities ?Discussed use of relaxation techniques and/or diversional activities to assist with pain reduction (distraction, imagery, relaxation, massage, acupressure, TENS, heat, and cold application ?Reviewed with patient prescribed pharmacological and nonpharmacological pain relief strategies ?Assessed social determinant of health barriers ? ?Patient Goals/Self-Care Activities: ?Take all medications as prescribed ?Attend all scheduled provider appointments ?Call pharmacy for medication refills 3-7 days in advance of running out of medications ?Perform all self care activities independently  ?Perform IADL's (shopping, preparing meals, housekeeping, managing finances) independently ?Call provider office for new concerns or questions  ?Work with the Education officer, museum to address care coordination needs and will continue to work with the clinical team to address health care and disease management related needs ? ?Follow Up Plan:  The patient has been provided with contact information for the care management team and has been advised to call with any health related questions or concerns.  ?The care management team will reach  out to the patient again over the next 30 days.   ? ?  ?

## 2021-09-06 NOTE — Patient Outreach (Signed)
?Medicaid Managed Care   ?Nurse Care Manager Note ? ?09/06/2021 ?Name:  [name] MRN:  [id number] DOB:  August 19, 1970 ? ?[name] is an 51 y.o. year old female who is a primary patient of Megan Pier, MD.  The [facility] Managed Care Coordination team was consulted for assistance with:    ?Chronic healthcare management needs, back pain, lymphedema, h/o anxiety/depression, asthma, HTN, CHF, CAD, OSA ? ?[name] was given information about Medicaid Managed Care Coordination team services today. [name] Patient agreed to services and verbal consent obtained. ? ?Engaged with patient by telephone for follow up visit in response to provider referral for case management and/or care coordination services.  ? ?Assessments/Interventions:  Review of past medical history, allergies, medications, health status, including review of consultants reports, laboratory and other test data, was performed as part of comprehensive evaluation and provision of chronic care management services. ? ?SDOH (Social Determinants of Health) assessments and interventions performed: ?SDOH Interventions   ? ?Flowsheet Row Most Recent Value  ?SDOH Interventions   ?Financial Strain Interventions Intervention Not Indicated  ? ?  ?Care Plan ? ?Allergies  ?Allergen Reactions  ? Ace Inhibitors Rash and Other (See Comments)  ?  Make pt bleed  ? Aspirin Other (See Comments)  ?  Per patient paperwork: blood clot?  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 Hydromorphone Hives and Itching  ? Vancomycin Itching and Rash  ? Contrast Media [Iodinated Contrast Media] Hives  ? Dilaudid [Hydromorphone Hcl] Hives  ? Lidocaine Itching  ?  Itching with Lidocaine patch reported 06/13/2021 vis telephone message.  ? ?Medications Reviewed Today   ? ? Reviewed by Megan Medicus, RN (Registered Nurse) on 09/06/21 at 36  Med List Status: <None>  ? ?Medication Order Taking? Sig Documenting Provider Last Dose Status Informant  ?Accu-Chek  Softclix Lancets lancets [id number] No Use as instructed  ?Patient taking differently: 1 each by Other route See admin instructions. Use as instructed  ? Megan Donovan, PA-C Taking Active Self  ?acetaminophen (TYLENOL) 500 MG tablet [id number] No Take 1 tablet (500 mg total) by mouth every 6 (six) hours as needed.  ?Patient taking differently: Take 500 mg by mouth every 6 (six) hours as needed for mild pain.  ? Megan Pier, MD Taking Active Self  ?albuterol [facility] HFA) 108 (90 Base) MCG/ACT inhaler [id number] No INHALE TWO PUFFS BY MOUTH EVERY 6 HOURS AS NEEDED FOR WHEEZING OR SHORTNESS OF BREATH  ?Patient taking differently: Inhale 2 puffs into the lungs See admin instructions. INHALE TWO PUFFS BY MOUTH EVERY 6 HOURS AS NEEDED FOR WHEEZING OR SHORTNESS OF BREATH  ? Megan Pier, MD Taking Active Self  ?amLODipine (NORVASC) 5 MG tablet [id number] No TAKE 1/2 TABLET BY MOUTH DAILY  ?Patient taking differently: Take 2.5 mg by mouth daily.  ? Megan Pier, MD Taking Active Self  ?atorvastatin (LIPITOR) 40 MG tablet [id number] No TAKE 1 TABLET BY MOUTH DAILY Megan Pier, MD Taking Active   ?Blood Glucose Calibration (ACCU-CHEK GUIDE CONTROL) LIQD [id number] No 1 each by In Vitro route daily. Megan Donovan, PA-C Taking Active Self  ?Blood Glucose Monitoring Suppl (ACCU-CHEK GUIDE) w/Device KIT [id number] No 1 each by Does not apply route 2 (two) times daily. Megan Donovan, PA-C Taking Active Self  ?Carboxymethylcellulose Sodium (EYE DROPS OP) [id number] No Place 1 drop into both eyes daily as needed (dry eyes). [provider] Taking Active Self  ?carvedilol (COREG) 25 MG tablet  [id number]  TAKE ONE TABLET BY MOUTH TWICE A DAY WITH MEALS Megan Pier, MD  Active   ?cholecalciferol (VITAMIN D3) 25 MCG (1000 UNIT) tablet [id number] No Take 1 tablet (1,000 Units total) by mouth daily. Megan Pier, MD Taking Active Self  ?COVID-19 mRNA Vac-TriS, Pfizer, (PFIZER-BIONT  COVID-19 VAC-TRIS) SUSP injection [id number] No Inject into the muscle. Carlyle Basques, MD Taking Active   ?dapagliflozin propanediol (FARXIGA) 10 MG TABS tablet [id number] No Take 1 tablet (10 mg total) by mouth daily. Megan Pier, MD Taking Active Self  ?DULERA 200-5 MCG/ACT AERO [id number] No INHALE 2 PUFFS INTO THE LUNGS TWO TIMES A DAY  ?Patient taking differently: Inhale 2 puffs into the lungs daily.  ? Megan Pier, MD Taking Active Self  ?ELIQUIS 5 MG TABS tablet [id number] No TAKE ONE TABLET BY MOUTH TWICE A DAY  ?Patient taking differently: Take 5 mg by mouth 2 (two) times daily.  ? Megan Pier, MD Taking Active Self  ?FEROSUL 325 (65 Fe) MG tablet [id number] No TAKE ONE TABLET BY MOUTH DAILY WITH BREAKFAST Megan Pier, MD Taking Active   ?furosemide (LASIX) 80 MG tablet [id number] No Take 1 tablet (80 mg total) by mouth daily. Take additional dose of lasix for weight gain, leg swelling Barb Merino, MD Taking Active   ?gabapentin (NEURONTIN) 100 MG capsule [id number] No TAKE 1 CAPSULE BY MOUTH THREE TIMES A DAY AS NEEDED FOR PAN  ?Patient taking differently: Take 100 mg by mouth 3 (three) times daily as needed (pain).  ? Megan Pier, MD Taking Active Self  ?glucose blood test strip [id number] No Use as instructed  ?Patient taking differently: 1 each by Other route See admin instructions. Use as instructed  ? Megan Donovan, PA-C Taking Active Self  ?HYDROcodone-acetaminophen (NORCO/VICODIN) 5-325 MG tablet [id number] No Take 1 tablet by mouth every 12 (twelve) hours as needed for moderate pain (severe pain). Persons, Bevely Palmer, [place] Taking Active   ?hydrocortisone cream 1 % [id number] No Apply 1 application topically daily as needed for itching. [provider] Taking Active Self  ?hydrOXYzine (VISTARIL) 25 MG capsule [id number] No Take 1 capsule (25 mg total) by mouth at bedtime as needed.  ?Patient taking differently: Take 25 mg by mouth at bedtime as needed for  anxiety or itching.  ? Megan Pier, MD Taking Active Self  ?KLOR-CON M20 20 MEQ tablet [id number] No TAKE ONE TABLET BY MOUTH DAILY  ?Patient taking differently: Take 20 mEq by mouth daily.  ? Megan Pier, MD Taking Active Self  ?methocarbamol (ROBAXIN) 500 MG tablet [id number] No TAKE ONE TABLET BY MOUTH DAILY AS NEEDED FOR MUSCLE SPASMS  ?Patient taking differently: Take 500 mg by mouth See admin instructions. TAKE ONE TABLET BY MOUTH DAILY AS NEEDED FOR MUSCLE SPASMS  ? Megan Pier, MD Taking Active Self  ?nitroGLYCERIN (NITROSTAT) 0.4 MG SL tablet [id number] No Place 1 tablet (0.4 mg total) under the tongue every 5 (five) minutes as needed for chest pain. Aline August, MD Taking Active Self  ?omeprazole (PRILOSEC) 20 MG capsule [id number] No Take 1 capsule (20 mg total) by mouth daily as needed (acid reflux). Fenton Foy, NP 05/25/2021 Expired 05/31/21 2359 Self  ?PARoxetine (PAXIL) 10 MG tablet [id number] No Take 1 tablet (10 mg total) by mouth daily. Megan Pier, MD Taking Active   ?spironolactone (ALDACTONE) 25 MG tablet [id number]  TAKE ONE TABLET BY MOUTH DAILY Megan Pier, MD  Active   ?  valsartan (DIOVAN) 160 MG tablet [id number] No Take 1 tablet (160 mg total) by mouth daily. Leonie Man, MD Taking Active Self  ?vitamin B-12 (CYANOCOBALAMIN) 1000 MCG tablet [id number] No Take 1,000 mcg by mouth daily. [provider] Taking Active Self  ? ?  ?  ? ?  ? ?Patient Active Problem List  ? Diagnosis Date Noted  ? OSA (obstructive sleep apnea) 07/11/2021  ? Depression 05/28/2021  ? Encounter for care related to Port-a-Cath 05/27/2021  ? Acute on chronic respiratory failure with hypoxia ([place]) 05/26/2021  ? Morbid obesity with BMI of 50.0-59.9, adult ([place]) 05/26/2021  ? Acute respiratory failure ([place]) 11/23/2020  ? Acute respiratory failure with hypoxia ([place]) 11/22/2020  ? Fever 11/22/2020  ? Bilateral lower extremity edema 11/22/2020  ? History of DVT (deep vein  thrombosis) 11/22/2020  ? Asthma, chronic, moderate persistent, with acute exacerbation 11/22/2020  ? Lower limb ulcer, calf, left, limited to breakdown of skin ([place]) 06/09/2020  ? Lymphedema 06/09/2020  ? Iron deficie

## 2021-09-08 ENCOUNTER — Other Ambulatory Visit: Payer: Self-pay | Admitting: Physician Assistant

## 2021-09-08 DIAGNOSIS — L8992 Pressure ulcer of unspecified site, stage 2: Secondary | ICD-10-CM

## 2021-09-11 ENCOUNTER — Ambulatory Visit: Payer: Medicaid Other

## 2021-09-11 DIAGNOSIS — R2689 Other abnormalities of gait and mobility: Secondary | ICD-10-CM

## 2021-09-11 DIAGNOSIS — G8929 Other chronic pain: Secondary | ICD-10-CM

## 2021-09-11 DIAGNOSIS — M6281 Muscle weakness (generalized): Secondary | ICD-10-CM

## 2021-09-11 DIAGNOSIS — M545 Low back pain, unspecified: Secondary | ICD-10-CM | POA: Diagnosis not present

## 2021-09-11 DIAGNOSIS — M546 Pain in thoracic spine: Secondary | ICD-10-CM

## 2021-09-11 NOTE — Therapy (Signed)
?OUTPATIENT PHYSICAL THERAPY TREATMENT NOTE ? ? ?Patient Name: [name] ?MRN: [id number] ?DOB:[birth year], 51 y.o., female ?Today's Date: 09/11/2021 ? ?PCP: Ladell Pier, MD ?REFERRING PROVIDER: Persons, Bevely Palmer, PA ? ? PT End of Session - 09/11/21 1130   ? ? Visit Number [phone]   ? Date for PT Re-Evaluation 09/14/21   ? Authorization Type [employer] MCD   ? PT Start Time 1130   ? PT Stop Time 9528   ? PT Time Calculation (min) 45 min   ? Activity Tolerance Patient tolerated treatment well;Patient limited by pain;Patient limited by fatigue   ? Behavior During Therapy [facility] for tasks assessed/performed   ? ?  ?  ? ?  ? ? ? ? ? ?Past Medical History:  ?Diagnosis Date  ? (HFpEF) heart failure with preserved ejection fraction (HCC) -> although echo suggests normal diastolic parameters with normal left atrial size 04/13/2020  ? Cellulitis   ? Coronary artery disease involving native coronary artery without angina pectoris   ? PT STATES - HAD A CARDIAC CATH - NOT TOLD SHE HAD CAD -> week note from [place] indicates history of MI (patient cannot corroborate  ? Diabetes mellitus without complication ([place])   ? DVT (deep venous thrombosis) ([place]) 09/17/2017  ? 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 Generalized anxiety disorder   ? H/O gastric ulcer 11/16/2018  ? History of small bowel obstruction   ? In childhood  ? Hypertension   ? Iron deficiency anemia due to chronic blood loss   ? Previously been followed by hematology for iron infusion every 2 weeks and as of 2019; full GI evaluation including capsule endoscopy negative.  ? Morbid obesity due to excess calories ([place])   ? OSA (obstructive sleep apnea) 07/11/2021  ? Sleep study April 06, 2019 (Dr. Halford Chessman): Moderate OSA (AHI of 24.4 and SPO2 low of 77%).  Majority events during REM sleep.-recommend CPAP, oral appliance or surgical. Reviewed sleep hygiene.  Avoid sedatives.    ? Osteoarthritis of left knee   ? Prediabetes   ? Small bowel  obstruction ([place])   ? as a child  ? Speech impediment   ? Stutter / stammer  ? ?Past Surgical History:  ?Procedure Laterality Date  ? ABDOMINAL WALL DEFECT REPAIR  1970  ? IR CV LINE INJECTION  10/24/2020  ? IR REMOVAL TUN ACCESS W/ PORT W/O FL MOD SED  05/28/2021  ? IVC FILTER INSERTION  2017  ? Lower Extremity Venous Duplex  06/23/2020  ? No evidence of DVT or superficial thrombosis bilaterally.  No evidence of deep venous insufficiency bilaterally.  No evidence of SSV reflux.  Right GSV in the calf has reflux, no reflux in L GSV.;  Repeated in July 2020-no DVT  ? OOPHORECTOMY  1996  ? OOPHORECTOMY  1997  ? PORTACATH PLACEMENT  2014  ? TRANSTHORACIC ECHOCARDIOGRAM  11/25/2020  ? EF 55 to 60%.  Normal LV function.  No RWM A.  Mild LVH.  GR 1 DD.  Normal PAP.  Normal aortic and mitral valve.  Mildly elevated RAP  ~8 mmHg.  ? ?Patient Active Problem List  ? Diagnosis Date Noted  ? OSA (obstructive sleep apnea) 07/11/2021  ? Depression 05/28/2021  ? Encounter for care related to Port-a-Cath 05/27/2021  ? Acute on chronic respiratory failure with hypoxia ([place]) 05/26/2021  ? Morbid obesity with BMI of 50.0-59.9, adult ([place]) 05/26/2021  ? Acute respiratory failure ([place]) 11/23/2020  ? Acute respiratory failure with hypoxia (  [place]) 11/22/2020  ? Fever 11/22/2020  ? Bilateral lower extremity edema 11/22/2020  ? History of DVT (deep vein thrombosis) 11/22/2020  ? Asthma, chronic, moderate persistent, with acute exacerbation 11/22/2020  ? Lower limb ulcer, calf, left, limited to breakdown of skin ([place]) 06/09/2020  ? Lymphedema 06/09/2020  ? Iron deficiency anemia due to chronic blood loss 05/10/2020  ? 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 Venous insufficiency 04/13/2020  ? S/P insertion of IVC (inferior vena caval) filter 04/13/2020  ? Chronic anemia 04/13/2020  ? Essential hypertension 04/13/2020  ? (HFpEF) heart failure with preserved ejection fraction (HCC) ->  although echo suggests normal diastolic parameters with normal left atrial size 04/13/2020  ? CAD (coronary artery disease) 11/17/2019  ? ? ?THERAPY DIAG:  ?Chronic low back pain, unspecified back pain laterality, unspecified whether sciatica present ? ?Pain in thoracic spine ? ?Muscle weakness ? ?Other abnormalities of gait and mobility ? ?REFERRING DIAG: Referral diagnosis: Acute right-sided low back pain with sciatica, sciatica laterality unspecified [M54.40] ? ?PERTINENT HISTORY: DVT (L calf, chronic), long term use of blood thinners, Asthma, morbid obesity, lymphedema, non healing wound L leg, DM TII, CHF (monitor during supine activities) ? ?PRECAUTIONS/RESTRICTIONS:  ? ?Fall ? ?SUBJECTIVE: I had another long lasting back spasm on Saturday night and it's still hurting really bad today. My doctor said that I can get a cortisone injection after I get an MRI done next Monday (22nd). I haven't been sleeping much lately because the pain is keeping me up.  ? ?Pain:  ?Are you having pain? Yes ?Pain location: upper tx to lower lumbar spine ?NPRS scale:  ?current 8/10  ?Aggravating factors: movement ?Relieving factors: sitting down (helps for short periods)       ?Pain description: constant, sharp, and aching ?Stage: Chronic ? ?OBJECTIVE: (taken on eval unless noted) ? ?MUSCLE LENGTH: ?Hamstrings: Right moderate restriction; Left no restriction ?ASLR: Right ASLR significantly reduced compared to PSLR ; Left ASLR significantly reduced compared to PSLR  ?  ?  ?LUMBAR AROM ?  ?AROM AROM  ?07/20/2021  ?Flexion WNL, w/ concordant pain  ?Extension limited by 25%, w/ concordant pain  ?Right lateral flexion limited by 25%, w/ concordant pain  ?Left lateral flexion limited by 50%, w/ concordant pain  ?Right rotation limited by 25%, w/ concordant pain  ?Left rotation limited by 25%, w/ concordant pain  ?  ?(Blank rows = not tested) ?  ?  ?LE MMT: ?  ?MMT Right ?07/20/2021 Left ?07/20/2021  ?Hip flexion (L2, L3)      ?Knee extension  (L3)      ?Knee flexion      ?Hip abduction      ?Hip extension      ?Hip external rotation      ?Hip internal rotation      ?Hip adduction      ?Ankle dorsiflexion (L4)      ?Ankle plantarflexion (S1)      ?Ankle inversion      ?Ankle eversion      ?Great Toe ext (L5)      ?Grossly 3+ 3+  ?  ?(Blank rows = not tested, score listed is out of 5 possible points.  N = WNL, D = diminished, C = clear for gross weakness with myotome testing, * = concordant pain with testing) ?  ?Functional Tests ?  ?Eval (07/20/2021)      ?Sustained supine bridge (dominant leg extended at 120'',  if reached): 0'' (norm 170'')      ?9'' STS: 9x  UE used? Y      ?       ?       ?       ?       ?       ?       ?       ?       ?       ?       ?       ?       ? ? ?ASTERISK SIGNS ?  ?  ?[place] Signs Eval (07/20/2021)  4/11  09/04/2021        ?R HS length Mod limitation            ?ASLR vs PSLR Significant deficit            ?pain 10/10  6/10  9/10        ?STS 9x w/ UE    12x w/UE        ?Bridge endurance unable  very small ROM          ?  ?  ?HOME EXERCISE PROGRAM: ?Access Code: [email or web address] ?URL: [email or web address] ?Date: 07/20/2021 ?Prepared by: Shearon Balo ?  ?Exercises ?- Seated Hip Abduction with Pelvic Floor Contraction and Resistance Loop  - 1 x daily - 7 x weekly - 3 sets - 10 reps ?- Seated Long Arc Quad  - 1 x daily - 7 x weekly - 3 sets - 10 reps ?- Seated Isometric Hip Adduction with Pelvic Floor Contraction  - 1 x daily - 7 x weekly - 3 sets - 10 reps ?- Seated March with Resistance  - 1 x daily - 7 x weekly - 3 sets - 10 reps ? ?TREATMENT 09/11/2021: ?Therapeutic Exercise: ?- nu-step L5 74m while taking subjective and planning session with patient ?- STS x10 ?- Seated horizontal abduction GTB 2x10 ?- Seated diagonals GTB x10 BIL ?- LAQ 2x10 BIL ?- LAQ 2# 2x10 BIL ?- Seated clamshells 3x10 ?- Seated hamstring stretch 2x60" BIL ?- seated row - GTB - 2x10 ?- seated marching 2# 3x10 BIL ? ? ?TREATMENT  09/04/2021: ?Therapeutic Exercise: ?- nu-step L5 64m while taking subjective and planning session with patient ?- 30" STS - 12 reps with UE on thighs ?- Seated horizontal abduction GTB 2x10 ?- Seated diagonals GTB x10 BIL ?- LAQ 2x

## 2021-09-12 ENCOUNTER — Other Ambulatory Visit: Payer: Self-pay | Admitting: Pharmacist

## 2021-09-12 DIAGNOSIS — Z8709 Personal history of other diseases of the respiratory system: Secondary | ICD-10-CM

## 2021-09-12 DIAGNOSIS — I2510 Atherosclerotic heart disease of native coronary artery without angina pectoris: Secondary | ICD-10-CM

## 2021-09-12 MED ORDER — DULERA 200-5 MCG/ACT IN AERO: 2.0000 | INHALATION_SPRAY | Freq: Two times a day (BID) | RESPIRATORY_TRACT | 1 refills | Status: DC

## 2021-09-12 MED ORDER — ATORVASTATIN CALCIUM 40 MG PO TABS: 40.0000 mg | ORAL_TABLET | Freq: Every day | ORAL | 0 refills | Status: DC

## 2021-09-13 ENCOUNTER — Ambulatory Visit: Payer: Medicaid Other | Admitting: Physical Therapy

## 2021-09-13 ENCOUNTER — Encounter: Payer: Self-pay | Admitting: Physical Therapy

## 2021-09-13 DIAGNOSIS — R2689 Other abnormalities of gait and mobility: Secondary | ICD-10-CM

## 2021-09-13 DIAGNOSIS — M6281 Muscle weakness (generalized): Secondary | ICD-10-CM

## 2021-09-13 DIAGNOSIS — M546 Pain in thoracic spine: Secondary | ICD-10-CM

## 2021-09-13 DIAGNOSIS — M545 Low back pain, unspecified: Secondary | ICD-10-CM | POA: Diagnosis not present

## 2021-09-13 NOTE — Therapy (Signed)
PHYSICAL THERAPY DISCHARGE SUMMARY  Visits from Start of Care: 8  Current functional level related to goals / functional outcomes: See assessment/goals   Remaining deficits: See assessment/goals   Education / Equipment: HEP and D/C plans  Patient agrees to discharge. Patient goals were met. Patient is being discharged due to lack of progress.  OUTPATIENT PHYSICAL THERAPY TREATMENT NOTE   Patient Name: [name] MRN: [id number] DOB:[birth year], 51 y.o., female Today's Date: 09/13/2021  PCP: Ladell Pier, MD REFERRING PROVIDER: Ladell Pier, MD   PT End of Session - 09/13/21 1111     Visit Number 8    Date for PT Re-Evaluation 09/14/21    Authorization Type [employer] MCD    PT Start Time 1110    PT Stop Time 1150    PT Time Calculation (min) 40 min    Activity Tolerance Patient tolerated treatment well;Patient limited by pain;Patient limited by fatigue    Behavior During Therapy [employer] 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   Morbid  obesity due to excess calories (HCC)    OSA (obstructive sleep apnea) 07/11/2021   Sleep study April 06, 2019 (Dr. Halford Chessman): Moderate OSA (AHI of 24.4 and SPO2 low of 77%).  Majority events during REM sleep.-recommend CPAP, oral appliance or surgical. Reviewed sleep hygiene.  Avoid sedatives.     Osteoarthritis of left knee    Prediabetes    Small bowel obstruction ([place])    as a child   Speech impediment    Stutter / stammer   Past Surgical History:  Procedure Laterality Date   ABDOMINAL WALL DEFECT REPAIR  1970   IR CV LINE INJECTION  10/24/2020   IR REMOVAL TUN ACCESS W/ PORT W/O FL MOD SED  05/28/2021   IVC FILTER INSERTION  2017   Lower Extremity Venous Duplex  06/23/2020   No evidence of DVT or superficial thrombosis bilaterally.  No evidence of deep venous insufficiency bilaterally.  No evidence of SSV reflux.  Right GSV in the calf has reflux, no reflux in L GSV.;  Repeated in July 2020-no DVT   [place]  2014   TRANSTHORACIC ECHOCARDIOGRAM  11/25/2020   EF 55 to 60%.  Normal LV function.  No RWM A.  Mild LVH.  GR 1 DD.  Normal PAP.  Normal aortic and mitral valve.  Mildly elevated RAP  ~8 mmHg.   Patient Active Problem List   Diagnosis Date  Noted   OSA (obstructive sleep apnea) 07/11/2021   Depression 05/28/2021   Encounter for care related to Port-a-Cath 05/27/2021   Acute on chronic respiratory failure with hypoxia ([place]) 05/26/2021   Morbid obesity with BMI of 50.0-59.9, adult ([place]) 05/26/2021   Acute respiratory failure ([place]) 11/23/2020   Acute respiratory failure with hypoxia ([place]) 11/22/2020   Fever 11/22/2020   Bilateral lower extremity edema 11/22/2020   History of DVT (deep vein thrombosis) 11/22/2020   Asthma, chronic, moderate persistent, with acute exacerbation 11/22/2020   Lower limb ulcer, calf, left, limited to breakdown of skin ([place]) 06/09/2020   Lymphedema 06/09/2020   Iron deficiency anemia due to  chronic blood loss 05/10/2020   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 ([place]) 04/13/2020   Venous insufficiency 04/13/2020   S/P insertion of IVC (inferior vena caval) filter 04/13/2020   Chronic anemia 04/13/2020   Essential hypertension 04/13/2020   (HFpEF) heart failure with preserved ejection fraction (HCC) -> although echo suggests normal diastolic parameters with normal left atrial size 04/13/2020   CAD (coronary artery disease) 11/17/2019    THERAPY DIAG:  Chronic low back pain, unspecified back pain laterality, unspecified whether sciatica present  Pain in thoracic spine  Muscle weakness  Other abnormalities of gait and mobility  REFERRING DIAG: Referral diagnosis: Acute right-sided low back pain with sciatica, sciatica laterality unspecified [M54.40]  PERTINENT HISTORY: DVT (L calf, chronic), long term use of blood thinners, Asthma, morbid obesity, lymphedema, non healing wound L leg, DM TII, CHF (monitor during supine activities)  PRECAUTIONS/RESTRICTIONS:   Fall  SUBJECTIVE:   Pt reports that she enjoys the exercises but has seen little progress  Pain:  Are you having pain? Yes Pain location: upper tx to lower lumbar spine NPRS scale:  current 8/10  Aggravating factors: movement Relieving factors: sitting down (helps for short periods)       Pain description: constant, sharp, and aching Stage: Chronic  OBJECTIVE: (taken on eval unless noted)  MUSCLE LENGTH: Hamstrings: Right moderate restriction; Left no restriction ASLR: Right ASLR significantly reduced compared to PSLR ; Left ASLR significantly reduced compared to PSLR      LUMBAR AROM   AROM AROM  07/20/2021  Flexion WNL, w/ concordant pain  Extension limited by 25%, w/ concordant pain  Right lateral flexion limited by 25%, w/ concordant pain  Left lateral flexion limited by 50%, w/ concordant pain  Right rotation limited by 25%, w/ concordant pain  Left  rotation limited by 25%, w/ concordant pain    (Blank rows = not tested)     LE MMT:   MMT Right 07/20/2021 Left 07/20/2021  Hip flexion (L2, L3)      Knee extension (L3)      Knee flexion      Hip abduction      Hip extension      Hip external rotation      Hip internal rotation      Hip adduction      Ankle dorsiflexion (L4)      Ankle plantarflexion (S1)      Ankle inversion      Ankle eversion      Great Toe ext (L5)      Grossly 3+ 3+    (Blank rows = not tested, score listed is out of 5 possible points.  N = WNL, D = diminished, C = clear for gross weakness with myotome testing, * = concordant pain with testing)  Functional Tests   Eval (07/20/2021)      Sustained supine bridge (dominant leg extended at 120'', if reached): 0'' (norm 170'')      30'' STS: 9x  UE used? Y                                                                                            ASTERISK SIGNS     Asterisk Signs Eval (07/20/2021)  4/11  09/04/2021        R HS length Mod limitation            ASLR vs PSLR Significant deficit            pain 10/10  6/10  9/10        STS 9x w/ UE    12x w/UE        Bridge endurance unable  very small ROM              HOME EXERCISE PROGRAM: Access Code: [email or web address] URL: [email or web address] Date: 09/13/2021 Prepared by: Shearon Balo  Exercises - Seated Hip Abduction with Pelvic Floor Contraction and Resistance Loop  - 1 x daily - 7 x weekly - 3 sets - 10 reps - Seated Isometric Hip Adduction with Pelvic Floor Contraction  - 1 x daily - 7 x weekly - 3 sets - 10 reps - Seated March with Resistance  - 1 x daily - 7 x weekly - 3 sets - 10 reps - Seated Knee Extension with Resistance  - 1 x daily - 7 x weekly - 3 sets - 10 reps - Seated Hamstring Curl with Anchored Resistance  - 1 x daily - 7 x weekly - 3 sets - 10 reps - Sit to Stand  - 1 x daily - 7 x weekly - 3 sets - 10 reps  TREATMENT 09/13/2021: Therapeutic  Exercise: - nu-step L5 720mwhile taking subjective and planning session with patient - STS x9 - reviewing, updating, and completing exercises on above HEP  Therapeutic Activity - collecting information for goals, checking progress, and reviewing with patient  TREATMENT 09/11/2021: Therapeutic Exercise: - nu-step L5 669mhile taking subjective and planning session with patient - STS x10 - Seated horizontal abduction GTB 2x10 - Seated diagonals GTB x10 BIL - LAQ 2x10 BIL - LAQ 2# 2x10 BIL - Seated clamshells 3x10 - Seated hamstring stretch 2x60" BIL - seated row - GTB - 2x10 - seated marching 2# 3x10 BIL   TREATMENT 09/04/2021: Therapeutic Exercise: - nu-step L5 20m19mile taking subjective and planning session with patient - 30" STS - 12 reps with UE on thighs - Seated horizontal abduction GTB 2x10 - Seated diagonals GTB x10 BIL - LAQ 2x10 BIL - Seated clamshells 3x10 - Seated hamstring stretch 2x60" BIL - seated row - GTB - 2x10 - seated marching 3x10 BIL   TREATMENT 4/27: Therapeutic Exercise: - nu-step L5 20m 58mle taking subjective and planning session with patient - Seated horizontal abduction GTB 2x10 - Seated diagonals GTB x10 BIL - LAQ 2x10 BIL - Seated clamshells 3x10 - Seated hamstring stretch 2x60" BIL - STS - 2x5 -  seated row - GTB - 2x10 - seated marching 3x10 BIL     ASSESSMENT:   CLINICAL IMPRESSION: Natania Finigan has progressed poorly with therapy.  Improved impairments include: minimal improvement.  Functional improvements include: minimal improvement.  Progressions needed include: continued work at home with HEP.  Barriers to progress include: chronic nature and comorbidities.  After a discussion with Meilissa I recommended she complete MRI and possible injections.  When her pain is a bit lower she can return and possibly try some aquatic therapy..  Please see GOALS section for progress on short term and long term goals established at evaluation.   I recommend D/C home with HEP; pt agrees with plan.  I messaged her PCP regarding and increase in SOB with supine positions.    OBJECTIVE IMPAIRMENTS: Pain, lumbar ROM, LE MMT   ACTIVITY LIMITATIONS: bending, squatting, lifting, standing, walking   PERSONAL FACTORS: See medical history and pertinent history     REHAB POTENTIAL: Fair chronic, PT has not been helpful in past   CLINICAL DECISION MAKING: Stable/uncomplicated   EVALUATION COMPLEXITY: Low     GOALS:     SHORT TERM GOALS:   Jaeleah will be >75% HEP compliant to improve carryover between sessions and facilitate independent management of condition   Evaluation (07/20/2021): ongoing Target date: 09/14/2021 Goal status: 4/11: MET     LONG TERM GOALS:   Chinita will show a >/= 12 pt improvement in their ODI score (MCID is 12 pts) as a proxy for functional improvement    Evaluation/Baseline (07/20/2021): 55% disability 5/18: Modified Oswestry Low Back Pain Disability Questionnaire: 27 / 50 = 54.0 % Target date: 09/14/2021 Goal status: NOT MET     2.  Roquel will improve 30'' STS (MCID 2) to >/= 10x (w/ UE?: N) to show improved LE strength and improved transfers    Evaluation/Baseline (07/20/2021): 9x  w/ UE? Y 9x w/ UE support Target date: 09/14/2021 Goal status: MET 09/04/2021 with 12 reps     3.  Pamela will be able to maintain supine bridge for 10'' (dominant leg extended if 120'' reached) as evidence of improved hip extension and core strength (norm for healthy adult ~170'')    Evaluation/Baseline (07/20/2021): unable Target date: 09/14/2021 Goal status: Not tested d/t shortness of breath with supine position     4.  Diona will self report >/= 50% decrease in pain from evaluation    Evaluation/Baseline (07/20/2021): 10/10 max pain, 9/10 average 5/18: 9/10 average Target date: 09/14/2021 Goal status: NOT MET     PLAN: PT FREQUENCY: 1-2x/week   PT DURATION: 8 weeks (Ending 09/14/2021)   PLANNED  INTERVENTIONS: Therapeutic exercises, Aquatic therapy, Therapeutic activity, Neuro Muscular re-education, Gait training, Patient/Family education, Joint mobilization, Dry Needling, Electrical stimulation, Spinal mobilization and/or manipulation, Moist heat, Taping, Vasopneumatic device, Ionotophoresis 45m/ml Dexamethasone, and Manual therapy   PLAN FOR NEXT SESSION: progressive hip and core strengthening, graded exposure, PNE    KKevan NyReinhartsen PT 09/13/2021, 11:53 AM

## 2021-09-17 ENCOUNTER — Ambulatory Visit
Admission: RE | Admit: 2021-09-17 | Discharge: 2021-09-17 | Disposition: A | Discharge status: Home / Self Care | Payer: Medicaid Other | Source: Ambulatory Visit | Attending: Physician Assistant | Admitting: Physician Assistant

## 2021-09-17 ENCOUNTER — Encounter: Payer: Self-pay | Admitting: Hematology

## 2021-09-17 DIAGNOSIS — M549 Dorsalgia, unspecified: Secondary | ICD-10-CM | POA: Diagnosis not present

## 2021-09-17 DIAGNOSIS — M5414 Radiculopathy, thoracic region: Secondary | ICD-10-CM

## 2021-09-17 NOTE — Progress Notes (Unsigned)
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   Morbid obesity due to excess calories (HCC)    OSA (obstructive sleep apnea) 07/11/2021   Sleep study April 06, 2019 (Dr. Halford Chessman): Moderate OSA (AHI of 24.4 and SPO2 low of 77%).  Majority events during REM sleep.-recommend CPAP, oral appliance or surgical. Reviewed sleep hygiene.  Avoid sedatives.     Osteoarthritis of left knee    Prediabetes    Small bowel obstruction ([place])    as a child   Speech impediment    Stutter / stammer   Past Surgical History:  Procedure Laterality Date    ABDOMINAL WALL DEFECT REPAIR  1970   IR CV LINE INJECTION  10/24/2020   IR REMOVAL TUN ACCESS W/ PORT W/O FL MOD SED  05/28/2021   IVC FILTER INSERTION  2017   Lower Extremity Venous Duplex  06/23/2020   No evidence of DVT or superficial thrombosis bilaterally.  No evidence of deep venous insufficiency bilaterally.  No evidence of SSV reflux.  Right GSV in the calf has reflux, no reflux in L GSV.;  Repeated in July 2020-no DVT   [place]  2014   TRANSTHORACIC ECHOCARDIOGRAM  11/25/2020   EF 55 to 60%.  Normal LV function.  No RWM A.  Mild LVH.  GR 1 DD.  Normal PAP.  Normal aortic and mitral valve.  Mildly elevated RAP  ~8 mmHg.    Allergies  Allergies  Allergen Reactions   Ace Inhibitors Rash and Other (See Comments)    Make pt bleed   Aspirin Other (See Comments)    Per patient paperwork: blood clot?  Likely because of chronic DOAC   Hydromorphone Hives and Itching   Vancomycin Itching and Rash   Contrast Media [Iodinated Contrast Media] Hives   Dilaudid [Hydromorphone Hcl] Hives   Lidocaine Itching    Itching with Lidocaine patch reported 06/13/2021 vis telephone message.    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  Patient has a reported history of cardiac catheterization, she states she was told she had nonobstructive CAD (by report, records not available).  She has a history of chronic DVT on Eliquis.  She additionally has a history of chronic diastolic heart failure.  Most recent echocardiogram in January 2023 showed EF 60 to 65%, mild concentric LVH, normal diastolic parameters, no significant valvular abnormalities.  She was last seen in the office on 07/11/2021.  She did have chronic  bilateral lower extremity edema, not thought to be related to diastolic heart failure given normal diastolic parameters on most recent echo.  Nonetheless, her Lasix was increased to 160 mg twice daily for 2 days.  Follow-up with pulmonology was advised to discuss CPAP she reported some orthopnea, daytime fatigue.  She presents today for follow-up. Since her last visit she has been stable overall from a cardiac standpoint.  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 She thinks that her current anti-anxiety medication regimen is not working well. Additionally, she has noticed some generalized fatigue. She does have a reported history of anemia, and thinks that this could be due to a low hemoglobin. She has follow-up scheduled with hematology/oncology next week.  She reports occasional shortness of breath/mild chest discomfort with increased anxiety.  She denies exertional symptoms concerning for angina, denies dyspnea on exertion, leg edema, weight gain, PND, orthopnea.  Other than her increased stress and anxiety, and generalized fatigue, she denies any new concerns today.  Home Medications    Current Outpatient Medications  Medication Sig Dispense Refill   Accu-Chek Softclix Lancets lancets Use as instructed (Patient taking differently: 1 each by Other route See admin instructions. Use as instructed) 100 each 12   acetaminophen (TYLENOL) 500 MG tablet Take 1 tablet (500 mg total) by mouth every 6 (six) hours as needed. (Patient taking differently: Take 500 mg by mouth every 6 (six) hours as needed for mild pain.) 60 tablet 0   acetaminophen (TYLENOL) 650 MG CR tablet      albuterol (PROAIR HFA) 108 (90 Base) MCG/ACT inhaler INHALE TWO PUFFS BY MOUTH EVERY 6 HOURS AS NEEDED FOR WHEEZING OR SHORTNESS OF BREATH (Patient taking differently: Inhale 2 puffs into the lungs See admin instructions. INHALE TWO PUFFS BY MOUTH EVERY 6  HOURS AS NEEDED FOR WHEEZING OR SHORTNESS OF BREATH) 8.5 g 0   amLODipine (NORVASC) 5 MG tablet TAKE 1/2 TABLET BY MOUTH DAILY (Patient taking differently: Take 2.5 mg by mouth daily.) 30 tablet 2   atorvastatin (LIPITOR) 40 MG tablet Take 1 tablet (40 mg total) by mouth daily. 90 tablet 0   Blood Glucose Calibration (ACCU-CHEK GUIDE CONTROL) LIQD 1 each by In Vitro route daily. 1 each 4   Blood Glucose Monitoring Suppl (ACCU-CHEK GUIDE) w/Device KIT 1 each by Does not apply route 2 (two) times daily. 1 kit 0   busPIRone (BUSPAR) 7.5 MG tablet Take 1 tablet by mouth 2 (two) times daily.     Carboxymethylcellulose Sodium (EYE DROPS OP) Place 1 drop into both eyes daily as needed (dry eyes).     carvedilol (COREG) 25 MG tablet TAKE ONE TABLET BY MOUTH TWICE A DAY WITH MEALS 60 tablet 1   cholecalciferol (VITAMIN D3) 25 MCG (1000 UNIT) tablet Take 1 tablet (1,000 Units total) by mouth daily. 30 tablet 5  COVID-19 mRNA Vac-TriS, Pfizer, (PFIZER-BIONT COVID-19 VAC-TRIS) SUSP injection Inject into the muscle. 0.3 mL 0   dapagliflozin propanediol (FARXIGA) 10 MG TABS tablet Take 1 tablet (10 mg total) by mouth daily. 30 tablet 6   ELIQUIS 5 MG TABS tablet TAKE ONE TABLET BY MOUTH TWICE A DAY (Patient taking differently: Take 5 mg by mouth 2 (two) times daily.) 60 tablet 3   FEROSUL 325 (65 Fe) MG tablet TAKE ONE TABLET BY MOUTH DAILY WITH BREAKFAST 100 tablet 0   furosemide (LASIX) 80 MG tablet Take 1 tablet (80 mg total) by mouth daily. Take additional dose of lasix for weight gain, leg swelling 30 tablet 4   gabapentin (NEURONTIN) 100 MG capsule TAKE 1 CAPSULE BY MOUTH THREE TIMES A DAY AS NEEDED FOR PAN (Patient taking differently: Take 100 mg by mouth 3 (three) times daily as needed (pain).) 90 capsule 2   glucose blood test strip Use as instructed (Patient taking differently: 1 each by Other route See admin instructions. Use as instructed) 100 each 12   hydrocortisone cream 1 % Apply 1 application  topically daily as needed for itching.     hydrOXYzine (VISTARIL) 25 MG capsule Take 1 capsule (25 mg total) by mouth at bedtime as needed. (Patient taking differently: Take 25 mg by mouth at bedtime as needed for anxiety or itching.) 30 capsule 1   KLOR-CON M20 20 MEQ tablet TAKE ONE TABLET BY MOUTH DAILY (Patient taking differently: Take 20 mEq by mouth daily.) 30 tablet 0   methocarbamol (ROBAXIN) 500 MG tablet TAKE ONE TABLET BY MOUTH DAILY AS NEEDED FOR MUSCLE SPASMS (Patient taking differently: Take 500 mg by mouth See admin instructions. TAKE ONE TABLET BY MOUTH DAILY AS NEEDED FOR MUSCLE SPASMS) 60 tablet 0   mometasone-formoterol (DULERA) 200-5 MCG/ACT AERO Inhale 2 puffs into the lungs in the morning and at bedtime. 13 g 1   nitroGLYCERIN (NITROSTAT) 0.4 MG SL tablet Place 1 tablet (0.4 mg total) under the tongue every 5 (five) minutes as needed for chest pain. 30 tablet 0   omeprazole (PRILOSEC) 20 MG capsule Take 1 capsule (20 mg total) by mouth daily as needed (acid reflux). 30 capsule 0   PARoxetine (PAXIL) 10 MG tablet Take 1 tablet (10 mg total) by mouth daily. 30 tablet 2   spironolactone (ALDACTONE) 25 MG tablet TAKE ONE TABLET BY MOUTH DAILY 30 tablet 2   valsartan (DIOVAN) 160 MG tablet Take 1 tablet (160 mg total) by mouth daily. 90 tablet 3   vitamin B-12 (CYANOCOBALAMIN) 1000 MCG tablet Take 1,000 mcg by mouth daily.     No current facility-administered medications for this visit.     Review of Systems   She denies chest pain, palpitations, dyspnea, pnd, orthopnea, n, v, dizziness, syncope, worsening edema, weight gain, or early satiety. All other systems reviewed and are otherwise negative except as noted above.   Physical Exam    VS:  BP 110/72   Pulse 72   Ht _0  (1.575 m)   Wt (!) 340 lb 3.2 oz (154.3 kg)   SpO2 96%   BMI 62.22 kg/m  GEN: Well nourished, well developed, in no acute distress. HEENT: normal. Neck: Supple, no JVD, carotid bruits, or  masses. Cardiac: RRR, no murmurs, rubs, or gallops. No clubbing, cyanosis, chronic bilateral lower extremity edema.  Radials/DP/PT 2+ and equal bilaterally.  Respiratory:  Respirations regular and unlabored, clear to auscultation bilaterally. GI: Soft, nontender, nondistended, BS + x 4. MS: no  deformity or atrophy. Skin: warm and dry, no rash. Neuro:  Strength and sensation are intact. Psych: Normal affect.  Accessory Clinical Findings    ECG personally reviewed by me today - No EKG in office today.  Lab Results  Component Value Date   WBC 5.8 05/29/2021   HGB 11.3 (L) 05/29/2021   HCT 37.2 05/29/2021   MCV 89.0 05/29/2021   PLT 140 (L) 05/29/2021   Lab Results  Component Value Date   CREATININE 0.97 06/01/2021   BUN 27 (H) 06/01/2021   NA 131 (L) 06/01/2021   K 3.6 06/01/2021   CL 91 (L) 06/01/2021   CO2 33 (H) 06/01/2021   Lab Results  Component Value Date   ALT 10 05/29/2021   AST 19 05/29/2021   ALKPHOS 55 05/29/2021   BILITOT 0.8 05/29/2021   No results found for: CHOL, HDL, LDLCALC, LDLDIRECT, TRIG, CHOLHDL  Lab Results  Component Value Date   HGBA1C 6.5 (H) 11/24/2020    Assessment & Plan   1. Chronic diastolic heart failure: Most recent echo in 04/2021 showed EF 60 to 65%, mild concentric LVH, normal diastolic parameters, no significant valvular abnormalities. Euvolemic and well compensated on exam.  She states she was told by her primary care doctor to take 80 mg of Lasix twice daily.  She has done this for the past 2 months and has been drinking extra water to "stay hydrated." Given stable fluid volume status, stable weight, will decrease Lasix to 80 mg daily. She may take an additional Lasix 80 mg daily as needed for swelling, weight gain. Discussed fluid and sodium recommendations. Continue carvedilol, Farxiga, Lasix, valsartan, and spironolactone.  2. Nonobstructive CAD: Per patient report. Cath records not available. She reports occasional shortness of  breath/mild chest discomfort with increased anxiety. She denies exertional symptoms concerning for angina. No indication for ischemic evaluation. Continue amlodipine, carvedilol, Farxiga, Lasix, spironolactone, valsartan, and Lipitor.  3. Hypertension: BP well controlled. Continue current antihypertensive regimen.   4. Chronic venous insufficiency/lymphedema: Stable. Continue lymphedema pump.   5. Chronic lower extremity DVT: On Eliquis. Denies bleeding.  6. Type 2 diabetes: A1c was 6.5 in July 2022.  Monitored and managed per PCP.  7. Obesity: She has lost some weight.  Encouraged ongoing lifestyle modifications with diet and activity as tolerated.  8. Anemia: Hemoglobin was 11.3 in January 2023.  Notes some generalized fatigue, she thinks it may be related to low hemoglobin.  She has follow-up scheduled with hematology/oncology next week.  9. Anxiety: She does report a significant amount of personal stress and feels that her anxiety is not well managed at this time.  Recommend follow-up with PCP.  10. OSA: She was referred back to pulmonology in 06/2021 for daytime fatigue.  She is awaiting a new CPAP machine.  It is also likely contributing to her fatigue.  11. Disposition: Follow-up in 3-5 months.   Lenna Sciara, NP 09/18/2021, 3:45 PM

## 2021-09-18 ENCOUNTER — Other Ambulatory Visit: Payer: Self-pay

## 2021-09-18 ENCOUNTER — Ambulatory Visit (INDEPENDENT_AMBULATORY_CARE_PROVIDER_SITE_OTHER): Payer: Medicaid Other | Admitting: Nurse Practitioner

## 2021-09-18 ENCOUNTER — Encounter: Payer: Self-pay | Admitting: Nurse Practitioner

## 2021-09-18 VITALS — BP 110/72 | HR 72 | Ht 62.0 in | Wt 340.2 lb

## 2021-09-18 DIAGNOSIS — I872 Venous insufficiency (chronic) (peripheral): Secondary | ICD-10-CM | POA: Diagnosis not present

## 2021-09-18 DIAGNOSIS — Z79899 Other long term (current) drug therapy: Secondary | ICD-10-CM

## 2021-09-18 DIAGNOSIS — R600 Localized edema: Secondary | ICD-10-CM | POA: Diagnosis not present

## 2021-09-18 DIAGNOSIS — F419 Anxiety disorder, unspecified: Secondary | ICD-10-CM

## 2021-09-18 DIAGNOSIS — I2510 Atherosclerotic heart disease of native coronary artery without angina pectoris: Secondary | ICD-10-CM

## 2021-09-18 DIAGNOSIS — I5032 Chronic diastolic (congestive) heart failure: Secondary | ICD-10-CM

## 2021-09-18 DIAGNOSIS — E1151 Type 2 diabetes mellitus with diabetic peripheral angiopathy without gangrene: Secondary | ICD-10-CM

## 2021-09-18 DIAGNOSIS — G4733 Obstructive sleep apnea (adult) (pediatric): Secondary | ICD-10-CM

## 2021-09-18 DIAGNOSIS — I890 Lymphedema, not elsewhere classified: Secondary | ICD-10-CM | POA: Diagnosis not present

## 2021-09-18 DIAGNOSIS — I10 Essential (primary) hypertension: Secondary | ICD-10-CM | POA: Diagnosis not present

## 2021-09-18 NOTE — Patient Instructions (Signed)
Medication Instructions:  Your physician recommends that you continue on your current medications as directed. Please refer to the Current Medication list given to you today.   *If you need a refill on your cardiac medications before your next appointment, please call your pharmacy*   Lab Work: Your physician recommends that you complete lab work today BMET   If you have labs (blood work) drawn today and your tests are completely normal, you will receive your results only by: MyChart Message (if you have MyChart) OR A paper copy in the mail If you have any lab test that is abnormal or we need to change your treatment, we will call you to review the results.   Testing/Procedures: NONE ordered at this time of appointment     Follow-Up: At [facility], you and your health needs are our priority.  As part of our continuing mission to provide you with exceptional heart care, we have created designated Provider Care Teams.  These Care Teams include your primary Cardiologist (physician) and Advanced Practice Providers (APPs -  Physician Assistants and Nurse Practitioners) who all work together to provide you with the care you need, when you need it.  We recommend signing up for the patient portal called "MyChart".  Sign up information is provided on this After Visit Summary.  MyChart is used to connect with patients for Virtual Visits (Telemedicine).  Patients are able to view lab/test results, encounter notes, upcoming appointments, etc.  Non-urgent messages can be sent to your provider as well.   To learn more about what you can do with MyChart, go to [email or web address].    Your next appointment:   4-5 month(s)  The format for your next appointment:   In Person  Provider:   Glenetta Hew, MD  or         Other Instructions   Important Information About Sugar

## 2021-09-18 NOTE — Patient Instructions (Signed)
Visit Information  [name] was given information about Medicaid Managed Care team care coordination services as a part of their [place] Medicaid benefit. [name] verbally consented to engagement with the [facility] Managed Care team.   If you are experiencing a medical emergency, please call 911 or report to your local emergency department or urgent care.   If you have a non-emergency medical problem during routine business hours, please contact your provider's office and ask to speak with a nurse.   For questions related to your [facility], please call: [phone] or visit the homepage here: [email or web address]  If you would like to schedule transportation through your [facility], please call the following number at least 2 days in advance of your appointment: [phone].  Rides for urgent appointments can also be made after hours by calling Member Services.  Call the [place] at [phone], at any time, 24 hours a day, 7 days a week. If you are in danger or need immediate medical attention call 911.  If you would like help to quit smoking, call 1-800-QUIT-NOW 918 358 3238) OR Espaol: 1-855-Djelo-Ya (4-627-035-0093) o para ms informacin haga clic aqu or Text READY to 200-400 to register via text  [name] - following are the goals we discussed in your visit today:   Goals Addressed   None     The  Patient                                              has been provided with contact information for the Managed Medicaid care management team and has been advised to call with any health related questions or concerns.   [name], BSW, [place]  High Risk Managed Medicaid Team  [phone]   Following is a copy of your plan of care:  Care Plan : [place]  of Care  Updates made by [name] since 09/18/2021 12:00 AM     Problem: Chronic Disease Management and Care Coordination Needs   Priority: High     Long-Range Goal: Self-Management Plan Developed   Start Date: 07/02/2021  Expected End Date: 01/07/2022  Recent Progress: Not on track  Priority: High  Note:   Current Barriers:  Knowledge Deficits related to plan of care for management of back pain. lymphedema Care Coordination needs related to Limited support and back pain Chronic Disease Management support and education needs related to back pain. Lymphedema  09/06/21:  Patient attending PT once a week for back and doing home exercises-has MRI scheduled for 09/17/21.  States left leg is improved-wearing compression wrap every day  RNCM Clinical Goal(s):  Patient will verbalize understanding of plan for management of back pain, lymphedema  as evidenced by patient report and attending PT sessions take all medications exactly as prescribed and will call provider for medication related questions as evidenced by patient report attend all scheduled medical appointments as evidenced by patient report continue to work with RN Care Manager to address care management and care coordination needs related to  back pain, lymphedema as evidenced by adherence to CM Team Scheduled appointments work with Education officer, museum to address  anxiety.  Limited support and hisory of anxiety/depression related to the management of back pain  as evidenced by review of  EMR and patient or social worker report through collaboration with Consulting civil engineer, provider, and care team.   Interventions: Inter-disciplinary care team collaboration (see longitudinal plan of care) Evaluation of current treatment plan related to  self management and patient's adherence to plan as established by provider Collaborated with [facility] to schedule an appt for her Collaborated with SW for anxiety SW referral for  anxiety 08/29/21: BSW completed telephone outreach with patient. She stated she was looking for more of an aide to assistance with her mother. Due to her having appointments and they have 2 dogs her mom is not always able to take both of them out. BSW provided patient with the telephone number for [employer] of [place]. BSW also informed patient she would need to speak with her moms PCP to have a prescription sent in to her insurance company for long term aide services. No other resources needed at this time.  09/18/21: BSW completed telephone outreach with patient. She stated her mom decided she would go through her PCP to start LTD services. Patient did state she is having a hard time getting refills for one of her medications but does not remember the name it. Patient states no other resources are needed at this time.   Pain Interventions:  (Status:  New goal.) Long Term Goal Pain assessment performed Medications reviewed Reviewed provider established plan for pain management Discussed importance of adherence to all scheduled medical appointments Counseled on the importance of reporting any/all new or changed pain symptoms or management strategies to pain management provider Advised patient to report to care team affect of pain on daily activities Discussed use of relaxation techniques and/or diversional activities to assist with pain reduction (distraction, imagery, relaxation, massage, acupressure, TENS, heat, and cold application Reviewed with patient prescribed pharmacological and nonpharmacological pain relief strategies Assessed social determinant of health barriers  Patient Goals/Self-Care Activities: Take all medications as prescribed Attend all scheduled provider appointments Call pharmacy for medication refills 3-7 days in advance of running out of medications Perform all self care activities independently  Perform IADL's (shopping, preparing meals, housekeeping, managing  finances) independently Call provider office for new concerns or questions  Work with the social worker to address care coordination needs and will continue to work with the clinical team to address health care and disease management related needs  Follow Up Plan:  The patient has been provided with contact information for the care management team and has been advised to call with any health related questions or concerns.  The care management team will reach out to the patient again over the next 30 days.

## 2021-09-18 NOTE — Patient Outreach (Signed)
Medicaid Managed Care Social Work Note  09/18/2021 Name:  [name] MRN:  [id number] DOB:  10/17/1970  [name] is an 51 y.o. year old female who is a primary patient of Aston, Deborah B, MD.  The Medicaid Managed Care Coordination team was consulted for assistance with:  Level of Care Concerns for mom   [name] was given information about Medicaid Managed Care Coordination team services today. [name] Patient agreed to services and verbal consent obtained.  Engaged with patient  for by telephone forfollow up visit in response to referral for case management and/or care coordination services.   Assessments/Interventions:  Review of past medical history, allergies, medications, health status, including review of consultants reports, laboratory and other test data, was performed as part of comprehensive evaluation and provision of chronic care management services.  SDOH: (Social Determinant of Health) assessments and interventions performed: BSW completed telephone outreach with patient. She stated her mom decided she would go through her PCP to start LTD services. Patient did state she is having a hard time getting refills for one of her medications but does not remember the name it. Patient states no other resources are needed at this time.   Advanced Directives Status:  Not addressed in this encounter.  Care Plan                 Allergies  Allergen Reactions   Ace Inhibitors Rash and Other (See Comments)    Make pt bleed   Aspirin Other (See Comments)    Per patient paperwork: blood clot?  Likely because of chronic DOAC   Hydromorphone Hives and Itching   Vancomycin Itching and Rash   Contrast Media [Iodinated Contrast Media] Hives   Dilaudid [Hydromorphone Hcl] Hives   Lidocaine Itching    Itching with Lidocaine patch reported 06/13/2021 vis telephone message.    Medications Reviewed Today     Reviewed by Reinhartsen, Karl E, PT  (Physical Therapist) on 09/13/21 at 1111  Med List Status: <None>   Medication Order Taking? Sig Documenting Provider Last Dose Status Informant  Accu-Chek Softclix Lancets lancets [id number] No Use as instructed  Patient taking differently: 1 each by Other route See admin instructions. Use as instructed   McClung, Angela M, PA-C Taking Active Self  acetaminophen (TYLENOL) 500 MG tablet [id number] No Take 1 tablet (500 mg total) by mouth every 6 (six) hours as needed.  Patient taking differently: Take 500 mg by mouth every 6 (six) hours as needed for mild pain.   Landin, Deborah B, MD Taking Active Self  albuterol (PROAIR HFA) 108 (90 Base) MCG/ACT inhaler [id number] No INHALE TWO PUFFS BY MOUTH EVERY 6 HOURS AS NEEDED FOR WHEEZING OR SHORTNESS OF BREATH  Patient taking differently: Inhale 2 puffs into the lungs See admin instructions. INHALE TWO PUFFS BY MOUTH EVERY 6 HOURS AS NEEDED FOR WHEEZING OR SHORTNESS OF BREATH   Antonucci, Deborah B, MD Taking Active Self  amLODipine (NORVASC) 5 MG tablet [id number] No TAKE 1/2 TABLET BY MOUTH DAILY  Patient taking differently: Take 2.5 mg by mouth daily.   Warzecha, Deborah B, MD Taking Active Self  atorvastatin (LIPITOR) 40 MG tablet [id number]  Take 1 tablet (40 mg total) by mouth daily. Fiala, Deborah B, MD  Active   Blood Glucose Calibration (ACCU-CHEK GUIDE CONTROL) LIQD [id number] No 1 each by In Vitro route daily. McClung, Angela M, PA-C Taking Active Self  Blood Glucose Monitoring Suppl (ACCU-CHEK GUIDE) w/Device KIT [id number] No 1   each by Does not apply route 2 (two) times daily. Argentina Donovan, PA-C Taking Active Self  Carboxymethylcellulose Sodium (EYE DROPS OP) [id number] No Place 1 drop into both eyes daily as needed (dry eyes). [provider] Taking Active Self  carvedilol (COREG) 25 MG tablet [id number]  TAKE ONE TABLET BY MOUTH TWICE A DAY WITH MEALS Ladell Pier, MD  Active   cholecalciferol (VITAMIN D3) 25 MCG (1000 UNIT)  tablet [id number] No Take 1 tablet (1,000 Units total) by mouth daily. Ladell Pier, MD Taking Active Self  COVID-19 mRNA Vac-TriS, Pfizer, (PFIZER-BIONT COVID-19 VAC-TRIS) SUSP injection [id number] No Inject into the muscle. Carlyle Basques, MD Taking Active   dapagliflozin propanediol (FARXIGA) 10 MG TABS tablet [id number] No Take 1 tablet (10 mg total) by mouth daily. Ladell Pier, MD Taking Active Self  ELIQUIS 5 MG TABS tablet [id number] No TAKE ONE TABLET BY MOUTH TWICE A DAY  Patient taking differently: Take 5 mg by mouth 2 (two) times daily.   Ladell Pier, MD Taking Active Self  FEROSUL 325 (65 Fe) MG tablet [id number] No TAKE ONE TABLET BY MOUTH DAILY WITH BREAKFAST Ladell Pier, MD Taking Active   furosemide (LASIX) 80 MG tablet [id number] No Take 1 tablet (80 mg total) by mouth daily. Take additional dose of lasix for weight gain, leg swelling Barb Merino, MD Taking Active   gabapentin (NEURONTIN) 100 MG capsule [id number] No TAKE 1 CAPSULE BY MOUTH THREE TIMES A DAY AS NEEDED FOR PAN  Patient taking differently: Take 100 mg by mouth 3 (three) times daily as needed (pain).   Ladell Pier, MD Taking Active Self  glucose blood test strip [id number] No Use as instructed  Patient taking differently: 1 each by Other route See admin instructions. Use as instructed   Argentina Donovan, PA-C Taking Active Self  HYDROcodone-acetaminophen (NORCO/VICODIN) 5-325 MG tablet [id number] No Take 1 tablet by mouth every 12 (twelve) hours as needed for moderate pain (severe pain). Persons, Bevely Palmer, [place] Taking Active   hydrocortisone cream 1 % [id number] No Apply 1 application topically daily as needed for itching. [provider] Taking Active Self  hydrOXYzine (VISTARIL) 25 MG capsule [id number] No Take 1 capsule (25 mg total) by mouth at bedtime as needed.  Patient taking differently: Take 25 mg by mouth at bedtime as needed for anxiety or itching.   Ladell Pier,  MD Taking Active Self  KLOR-CON M20 20 MEQ tablet [id number] No TAKE ONE TABLET BY MOUTH DAILY  Patient taking differently: Take 20 mEq by mouth daily.   Ladell Pier, MD Taking Active Self  methocarbamol (ROBAXIN) 500 MG tablet [id number] No TAKE ONE TABLET BY MOUTH DAILY AS NEEDED FOR MUSCLE SPASMS  Patient taking differently: Take 500 mg by mouth See admin instructions. TAKE ONE TABLET BY MOUTH DAILY AS NEEDED FOR MUSCLE SPASMS   Ladell Pier, MD Taking Active Self  mometasone-formoterol [facility]) 200-5 MCG/ACT Hollie Salk [id number]  Inhale 2 puffs into the lungs in the morning and at bedtime. Ladell Pier, MD  Active   nitroGLYCERIN (NITROSTAT) 0.4 MG SL tablet [id number] No Place 1 tablet (0.4 mg total) under the tongue every 5 (five) minutes as needed for chest pain. Aline August, MD Taking Active Self  omeprazole (PRILOSEC) 20 MG capsule [id number] No Take 1 capsule (20 mg total) by mouth daily as needed (acid reflux). Fenton Foy, NP 05/25/2021 Expired 05/31/21 2359 Self  PARoxetine (PAXIL) 10 MG tablet [id number] No  Take 1 tablet (10 mg total) by mouth daily. Beckstead, Deborah B, MD Taking Active   spironolactone (ALDACTONE) 25 MG tablet [id number]  TAKE ONE TABLET BY MOUTH DAILY Crosby, Deborah B, MD  Active   valsartan (DIOVAN) 160 MG tablet [id number] No Take 1 tablet (160 mg total) by mouth daily. Harding, David W, MD Taking Active Self  vitamin B-12 (CYANOCOBALAMIN) 1000 MCG tablet [id number] No Take 1,000 mcg by mouth daily. [provider] Taking Active Self            Patient Active Problem List   Diagnosis Date Noted   OSA (obstructive sleep apnea) 07/11/2021   Depression 05/28/2021   Encounter for care related to Port-a-Cath 05/27/2021   Acute on chronic respiratory failure with hypoxia (HCC) 05/26/2021   Morbid obesity with BMI of 50.0-59.9, adult (HCC) 05/26/2021   Acute respiratory failure (HCC) 11/23/2020   Acute respiratory failure with hypoxia  (HCC) 11/22/2020   Fever 11/22/2020   Bilateral lower extremity edema 11/22/2020   History of DVT (deep vein thrombosis) 11/22/2020   Asthma, chronic, moderate persistent, with acute exacerbation 11/22/2020   Lower limb ulcer, calf, left, limited to breakdown of skin (HCC) 06/09/2020   Lymphedema 06/09/2020   Iron deficiency anemia due to chronic blood loss 05/10/2020   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 (HCC) 04/13/2020   Venous insufficiency 04/13/2020   S/P insertion of IVC (inferior vena caval) filter 04/13/2020   Chronic anemia 04/13/2020   Essential hypertension 04/13/2020   (HFpEF) heart failure with preserved ejection fraction (HCC) -> although echo suggests normal diastolic parameters with normal left atrial size 04/13/2020   CAD (coronary artery disease) 11/17/2019    Conditions to be addressed/monitored per PCP order:   level of care for mom  Care Plan : RN Care Manager Plan of Care  Updates made by Shields, Alexis J since 09/18/2021 12:00 AM     Problem: Chronic Disease Management and Care Coordination Needs   Priority: High     Long-Range Goal: Self-Management Plan Developed   Start Date: 07/02/2021  Expected End Date: 01/07/2022  Recent Progress: Not on track  Priority: High  Note:   Current Barriers:  Knowledge Deficits related to plan of care for management of back pain. lymphedema Care Coordination needs related to Limited support and back pain Chronic Disease Management support and education needs related to back pain. Lymphedema  09/06/21:  Patient attending PT once a week for back and doing home exercises-has MRI scheduled for 09/17/21.  States left leg is improved-wearing compression wrap every day  RNCM Clinical Goal(s):  Patient will verbalize understanding of plan for management of back pain, lymphedema  as evidenced by patient report and attending PT sessions take all medications exactly as prescribed and will  call provider for medication related questions as evidenced by patient report attend all scheduled medical appointments as evidenced by patient report continue to work with RN Care Manager to address care management and care coordination needs related to  back pain, lymphedema as evidenced by adherence to CM Team Scheduled appointments work with social worker to address  anxiety.  Limited support and hisory of anxiety/depression related to the management of back pain  as evidenced by review of EMR and patient or social worker report through collaboration with RN Care manager, provider, and care team.   Interventions: Inter-disciplinary care team collaboration (see longitudinal plan of care) Evaluation of current treatment plan related to  self management   and patient's adherence to plan as established by provider Collaborated with [facility] to schedule an appt for her Collaborated with SW for anxiety SW referral for anxiety 08/29/21: BSW completed telephone outreach with patient. She stated she was looking for more of an aide to assistance with her mother. Due to her having appointments and they have 2 dogs her mom is not always able to take both of them out. BSW provided patient with the telephone number for [employer] of [place]. BSW also informed patient she would need to speak with her moms PCP to have a prescription sent in to her insurance company for long term aide services. No other resources needed at this time.  09/18/21: BSW completed telephone outreach with patient. She stated her mom decided she would go through her PCP to start LTD services. Patient did state she is having a hard time getting refills for one of her medications but does not remember the name it. Patient states no other resources are needed at this time.   Pain Interventions:  (Status:  New goal.) Long Term Goal Pain assessment performed Medications reviewed Reviewed provider established plan for pain  management Discussed importance of adherence to all scheduled medical appointments Counseled on the importance of reporting any/all new or changed pain symptoms or management strategies to pain management provider Advised patient to report to care team affect of pain on daily activities Discussed use of relaxation techniques and/or diversional activities to assist with pain reduction (distraction, imagery, relaxation, massage, acupressure, TENS, heat, and cold application Reviewed with patient prescribed pharmacological and nonpharmacological pain relief strategies Assessed social determinant of health barriers  Patient Goals/Self-Care Activities: Take all medications as prescribed Attend all scheduled provider appointments Call pharmacy for medication refills 3-7 days in advance of running out of medications Perform all self care activities independently  Perform IADL's (shopping, preparing meals, housekeeping, managing finances) independently Call provider office for new concerns or questions  Work with the social worker to address care coordination needs and will continue to work with the clinical team to address health care and disease management related needs  Follow Up Plan:  The patient has been provided with contact information for the care management team and has been advised to call with any health related questions or concerns.  The care management team will reach out to the patient again over the next 30 days.      Follow up:  Patient agrees to Care Plan and Follow-up.  Plan: The  Patient has been provided with contact information for the Managed Medicaid care management team and has been advised to call with any health related questions or concerns.    [name], BSW, [place] Managed Medicaid Team  [phone]

## 2021-09-19 ENCOUNTER — Other Ambulatory Visit: Payer: Self-pay

## 2021-09-19 DIAGNOSIS — D500 Iron deficiency anemia secondary to blood loss (chronic): Secondary | ICD-10-CM

## 2021-09-19 LAB — BASIC METABOLIC PANEL
BUN/Creatinine Ratio: 14 (ref 9–23)
BUN: 13 mg/dL (ref 6–24)
CO2: 23 mmol/L (ref 20–29)
Calcium: 9.4 mg/dL (ref 8.7–10.2)
Chloride: 101 mmol/L (ref 96–106)
Creatinine, Ser: 0.93 mg/dL (ref 0.57–1.00)
Glucose: 82 mg/dL (ref 70–99)
Potassium: 4 mmol/L (ref 3.5–5.2)
Sodium: 140 mmol/L (ref 134–144)
eGFR: 73 mL/min/{1.73_m2} (ref >59)

## 2021-09-20 ENCOUNTER — Telehealth: Payer: Self-pay

## 2021-09-20 ENCOUNTER — Other Ambulatory Visit: Payer: Self-pay | Admitting: Pharmacist

## 2021-09-20 ENCOUNTER — Other Ambulatory Visit: Payer: Self-pay

## 2021-09-20 ENCOUNTER — Inpatient Hospital Stay: Payer: Medicaid Other | Attending: Hematology

## 2021-09-20 ENCOUNTER — Inpatient Hospital Stay (HOSPITAL_BASED_OUTPATIENT_CLINIC_OR_DEPARTMENT_OTHER): Payer: Medicaid Other | Admitting: Hematology

## 2021-09-20 VITALS — BP 162/82 | HR 105 | Temp 97.0°F | Resp 18 | Wt 342.5 lb

## 2021-09-20 DIAGNOSIS — E119 Type 2 diabetes mellitus without complications: Secondary | ICD-10-CM | POA: Diagnosis not present

## 2021-09-20 DIAGNOSIS — Z79899 Other long term (current) drug therapy: Secondary | ICD-10-CM | POA: Insufficient documentation

## 2021-09-20 DIAGNOSIS — E538 Deficiency of other specified B group vitamins: Secondary | ICD-10-CM | POA: Insufficient documentation

## 2021-09-20 DIAGNOSIS — Z86718 Personal history of other venous thrombosis and embolism: Secondary | ICD-10-CM | POA: Diagnosis not present

## 2021-09-20 DIAGNOSIS — K922 Gastrointestinal hemorrhage, unspecified: Secondary | ICD-10-CM | POA: Diagnosis not present

## 2021-09-20 DIAGNOSIS — D500 Iron deficiency anemia secondary to blood loss (chronic): Secondary | ICD-10-CM

## 2021-09-20 DIAGNOSIS — I10 Essential (primary) hypertension: Secondary | ICD-10-CM

## 2021-09-20 LAB — CBC WITH DIFFERENTIAL (CANCER CENTER ONLY)
Abs Immature Granulocytes: 0.02 10*3/uL (ref 0.00–0.07)
Basophils Absolute: 0 10*3/uL (ref 0.0–0.1)
Basophils Relative: 0 %
Eosinophils Absolute: 0.1 10*3/uL (ref 0.0–0.5)
Eosinophils Relative: 1 %
HCT: 36.8 % (ref 36.0–46.0)
Hemoglobin: 11.2 g/dL — ABNORMAL LOW (ref 12.0–15.0)
Immature Granulocytes: 0 %
Lymphocytes Relative: 15 %
Lymphs Abs: 0.9 10*3/uL (ref 0.7–4.0)
MCH: 27.1 pg (ref 26.0–34.0)
MCHC: 30.4 g/dL (ref 30.0–36.0)
MCV: 89.1 fL (ref 80.0–100.0)
Monocytes Absolute: 0.5 10*3/uL (ref 0.1–1.0)
Monocytes Relative: 8 %
Neutro Abs: 4.5 10*3/uL (ref 1.7–7.7)
Neutrophils Relative %: 76 %
Platelet Count: 153 10*3/uL (ref 150–400)
RBC: 4.13 MIL/uL (ref 3.87–5.11)
RDW: 14.6 % (ref 11.5–15.5)
WBC Count: 5.9 10*3/uL (ref 4.0–10.5)
nRBC: 0 % (ref 0.0–0.2)

## 2021-09-20 LAB — IRON AND IRON BINDING CAPACITY (CC-WL,HP ONLY)
Iron: 46 ug/dL (ref 28–170)
Saturation Ratios: 12 % (ref 10.4–31.8)
TIBC: 377 ug/dL (ref 250–450)
UIBC: 331 ug/dL (ref 148–442)

## 2021-09-20 LAB — CMP (CANCER CENTER ONLY)
ALT: 8 U/L (ref 0–44)
AST: 12 U/L — ABNORMAL LOW (ref 15–41)
Albumin: 4.2 g/dL (ref 3.5–5.0)
Alkaline Phosphatase: 78 U/L (ref 38–126)
Anion gap: 6 (ref 5–15)
BUN: 12 mg/dL (ref 6–20)
CO2: 30 mmol/L (ref 22–32)
Calcium: 9.8 mg/dL (ref 8.9–10.3)
Chloride: 103 mmol/L (ref 98–111)
Creatinine: 0.86 mg/dL (ref 0.44–1.00)
GFR, Estimated: >60 mL/min (ref >60)
Glucose, Bld: 136 mg/dL — ABNORMAL HIGH (ref 70–99)
Potassium: 4.1 mmol/L (ref 3.5–5.1)
Sodium: 139 mmol/L (ref 135–145)
Total Bilirubin: 0.6 mg/dL (ref 0.3–1.2)
Total Protein: 8.4 g/dL — ABNORMAL HIGH (ref 6.5–8.1)

## 2021-09-20 LAB — FERRITIN: Ferritin: 58 ng/mL (ref 11–307)

## 2021-09-20 MED ORDER — AMLODIPINE BESYLATE 5 MG PO TABS: 2.5000 mg | ORAL_TABLET | Freq: Every day | ORAL | 0 refills | Status: DC

## 2021-09-20 NOTE — Telephone Encounter (Signed)
Spoke with pt. Pt was notified of lab results and will follow up as planned.  

## 2021-09-25 ENCOUNTER — Encounter: Payer: Self-pay | Admitting: Orthopaedic Surgery

## 2021-09-25 ENCOUNTER — Ambulatory Visit (INDEPENDENT_AMBULATORY_CARE_PROVIDER_SITE_OTHER): Payer: Medicaid Other | Admitting: Orthopaedic Surgery

## 2021-09-25 DIAGNOSIS — M545 Low back pain, unspecified: Secondary | ICD-10-CM

## 2021-09-25 DIAGNOSIS — G8929 Other chronic pain: Secondary | ICD-10-CM

## 2021-09-25 DIAGNOSIS — M546 Pain in thoracic spine: Secondary | ICD-10-CM

## 2021-09-25 DIAGNOSIS — Z6841 Body Mass Index (BMI) 40.0 and over, adult: Secondary | ICD-10-CM | POA: Diagnosis not present

## 2021-09-25 DIAGNOSIS — M5440 Lumbago with sciatica, unspecified side: Secondary | ICD-10-CM
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
## 2021-09-26 ENCOUNTER — Encounter: Payer: Self-pay | Admitting: Hematology

## 2021-09-26 NOTE — Progress Notes (Signed)
HEMATOLOGY/ONCOLOGY CLINIC NOTE  Date of Service: 09/20/2021   Patient Care Team: Ladell Pier, MD as PCP - General (Internal Medicine) Leonie Man, MD as PCP - Cardiology (Cardiology) Craft, Lorel Monaco, RN as Case Manager Greg Cutter, LCSW as [place] Management (Licensed Clinical Social Worker) [name] as Social Worker  CHIEF COMPLAINTS/PURPOSE OF CONSULTATION:  Follow-up for continued management of iron deficiency anemia  HISTORY OF PRESENTING ILLNESS:   [name] is a wonderful 51 y.o. female who has been referred to [place] by Dr. [name] for evaluation and management of anemia. The pt reports that she is doing well overall.   The pt reports that she has had anemia since birth but is unaware of any genetic condition causing her anemia. She received IV iron for about 4 years but stopped 2 years ago when she moved to [place]. Pt had a Port-a-Cath placed for repeat IV Iron Infusions due to issues accessing her veins.   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 She has been on anticoagulation for nearly 10 years and denies any repeat blood clots since starting Eliquis. Her PCP is currently managing her anticoagulation. 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 The ulcer tends to heal in the winter and reopen in the summer. It is currently healed with no open area.  Pt had a Colonoscopy and EGD at [facility] in [place] in August of 2021 that were benign. She was experiencing bloody stools frequently but hasn't had any after her 2021 GI studies. She has discontinued Aspirin due to concerns for excessive blood loss.   Pt has had a bilateral oophorectomies. She does not recall being diagnosed with PCOS. She denies gluten intolerance or any thyroid dysfunction. Pt had a fall in 2019 that is causing ongong back  pain. She was seen after the fall and was found to have injury at L1 & L2. She has been given Tylenol for pain management. Pt has a history of chronic acid suppression. She had stopped but recently restarted Prilosec. She was Vitamin B12 deficient for about one year. Pt had a heart attack in but denies any history of liver disease. Pt has had her COVID19 vaccines and booster.  Most recent lab results  of CBC w/diff is as follows: all values are WNL except for RBC at 3.80, Hgb at 10.1, HCT at 35.3, MCHC at 28.6, RDW at 16.2, PLT at 148K.  On review of systems, pt reports fatigue and denies nose bleeds, gum bleeds, bloody stools, heavy menstrual losses, fevers, unexpected weight loss, chills, new back pain, abdominal pain and any other symptoms.   On PMHx the pt reports B/L Oophorectomy, Myocardial infarction, DVT, Iron deficiency anemia due to chronic blood loss.  INTERVAL HISTORY   [name] is a wonderful 51 y.o. female who is here for continued valuation and management of her anemia.  Her last visit with [place] was about 6 months ago.. Patient did receive 1 dose of IV Injectafer since her last clinic visit. She was admitted in the hospital in January 2023 for acute on chronic congestive heart failure. She notes no evidence of overt GI bleeding since her last clinic visit.  No epistaxis.  No hematuria.  No vaginal bleeding. Has been taking p.o. iron 1 tablet p.o. daily of ferrous sulfate. Labs done today were reviewed in  detail with the patient.  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   Morbid obesity due to excess calories (HCC)    OSA (obstructive sleep apnea) 07/11/2021   Sleep study April 06, 2019 (Dr. Halford Chessman): Moderate OSA (AHI of 24.4 and SPO2 low of 77%).  Majority events during REM sleep.-recommend CPAP, oral appliance or surgical. Reviewed sleep hygiene.  Avoid sedatives.     Osteoarthritis of left knee    Prediabetes    Small bowel obstruction ([place])    as a child   Speech impediment    Stutter / stammer    SURGICAL HISTORY: Past Surgical History:  Procedure Laterality Date   ABDOMINAL WALL DEFECT REPAIR  1970   IR CV LINE INJECTION  10/24/2020   IR REMOVAL TUN ACCESS W/ PORT W/O FL MOD SED  05/28/2021   IVC FILTER INSERTION  2017   Lower Extremity Venous Duplex  06/23/2020   No evidence of DVT or superficial thrombosis bilaterally.  No evidence of deep venous insufficiency bilaterally.  No evidence of SSV reflux.  Right GSV in the calf has reflux, no reflux in L GSV.;  Repeated in July 2020-no DVT   [place]  2014   TRANSTHORACIC ECHOCARDIOGRAM  11/25/2020   EF 55 to 60%.  Normal LV function.  No RWM A.  Mild LVH.  GR 1 DD.  Normal PAP.  Normal aortic and mitral valve.  Mildly elevated RAP  ~8 mmHg.    SOCIAL HISTORY: Social History   Socioeconomic History   Marital status: Single    Spouse name: Not on file   Number of children: 0   Years of education: Not on file   Highest education level: 12th  grade  Occupational History   Occupation: unemployed on disablity  Tobacco Use   Smoking status: Former   Smokeless tobacco: Never  Scientific laboratory technician Use: Never used  Substance and Sexual Activity   Alcohol use: Not Currently   Drug use: Not Currently   Sexual activity: Not Currently  Other Topics Concern   Not on file  Social History Narrative   Not on file   Social Determinants of Health   Financial Resource Strain: Low Risk    Difficulty of Paying Living Expenses: Not very hard  Food Insecurity: Not on file  Transportation Needs: Not on file  Physical Activity: Inactive   Days of Exercise per Week: 0 days   Minutes of Exercise per Session: 0 min  Stress: Stress Concern Present  Feeling of Stress : To some extent  Social Connections: Socially Isolated   Frequency of Communication with Friends and Family: More than three times a week   Frequency of Social Gatherings with Friends and Family: More than three times a week   Attends Religious Services: Never   Marine scientist or Organizations: No   Attends Music therapist: Never   Marital Status: Never married  Human resources officer Violence: Not At Risk   Fear of Current or Ex-Partner: No   Emotionally Abused: No   Physically Abused: No   Sexually Abused: No    FAMILY HISTORY: Family History  Problem Relation Age of Onset   Diabetes Mellitus II Mother    COPD Father    Diabetes Father    Diabetes Mellitus II Maternal Grandmother    Breast cancer Paternal Grandfather     ALLERGIES:  is allergic to ace inhibitors, aspirin, hydromorphone, vancomycin, contrast media [iodinated contrast media], dilaudid [hydromorphone hcl], and lidocaine.  MEDICATIONS:  Current Outpatient Medications  Medication Sig Dispense Refill   Accu-Chek Softclix Lancets lancets Use as instructed (Patient taking differently: 1 each by Other route See admin instructions. Use as instructed) 100 each 12   acetaminophen  (TYLENOL) 500 MG tablet Take 1 tablet (500 mg total) by mouth every 6 (six) hours as needed. (Patient taking differently: Take 500 mg by mouth every 6 (six) hours as needed for mild pain.) 60 tablet 0   acetaminophen (TYLENOL) 650 MG CR tablet      albuterol (PROAIR HFA) 108 (90 Base) MCG/ACT inhaler INHALE TWO PUFFS BY MOUTH EVERY 6 HOURS AS NEEDED FOR WHEEZING OR SHORTNESS OF BREATH (Patient taking differently: Inhale 2 puffs into the lungs See admin instructions. INHALE TWO PUFFS BY MOUTH EVERY 6 HOURS AS NEEDED FOR WHEEZING OR SHORTNESS OF BREATH) 8.5 g 0   amLODipine (NORVASC) 5 MG tablet Take 0.5 tablets (2.5 mg total) by mouth daily. 45 tablet 0   atorvastatin (LIPITOR) 40 MG tablet Take 1 tablet (40 mg total) by mouth daily. 90 tablet 0   Blood Glucose Calibration (ACCU-CHEK GUIDE CONTROL) LIQD 1 each by In Vitro route daily. 1 each 4   Blood Glucose Monitoring Suppl (ACCU-CHEK GUIDE) w/Device KIT 1 each by Does not apply route 2 (two) times daily. 1 kit 0   busPIRone (BUSPAR) 7.5 MG tablet Take 1 tablet by mouth 2 (two) times daily.     Carboxymethylcellulose Sodium (EYE DROPS OP) Place 1 drop into both eyes daily as needed (dry eyes).     carvedilol (COREG) 25 MG tablet TAKE ONE TABLET BY MOUTH TWICE A DAY WITH MEALS 60 tablet 1   cholecalciferol (VITAMIN D3) 25 MCG (1000 UNIT) tablet Take 1 tablet (1,000 Units total) by mouth daily. 30 tablet 5   COVID-19 mRNA Vac-TriS, Pfizer, (PFIZER-BIONT COVID-19 VAC-TRIS) SUSP injection Inject into the muscle. 0.3 mL 0   dapagliflozin propanediol (FARXIGA) 10 MG TABS tablet Take 1 tablet (10 mg total) by mouth daily. 30 tablet 6   ELIQUIS 5 MG TABS tablet TAKE ONE TABLET BY MOUTH TWICE A DAY (Patient taking differently: Take 5 mg by mouth 2 (two) times daily.) 60 tablet 3   FEROSUL 325 (65 Fe) MG tablet TAKE ONE TABLET BY MOUTH DAILY WITH BREAKFAST 100 tablet 0   furosemide (LASIX) 80 MG tablet Take 1 tablet (80 mg total) by mouth daily. Take  additional dose of lasix for weight gain, leg swelling 30 tablet 4   gabapentin (NEURONTIN)  100 MG capsule TAKE 1 CAPSULE BY MOUTH THREE TIMES A DAY AS NEEDED FOR PAN (Patient taking differently: Take 100 mg by mouth 3 (three) times daily as needed (pain).) 90 capsule 2   glucose blood test strip Use as instructed (Patient taking differently: 1 each by Other route See admin instructions. Use as instructed) 100 each 12   hydrocortisone cream 1 % Apply 1 application topically daily as needed for itching.     hydrOXYzine (VISTARIL) 25 MG capsule Take 1 capsule (25 mg total) by mouth at bedtime as needed. (Patient taking differently: Take 25 mg by mouth at bedtime as needed for anxiety or itching.) 30 capsule 1   KLOR-CON M20 20 MEQ tablet TAKE ONE TABLET BY MOUTH DAILY (Patient taking differently: Take 20 mEq by mouth daily.) 30 tablet 0   methocarbamol (ROBAXIN) 500 MG tablet TAKE ONE TABLET BY MOUTH DAILY AS NEEDED FOR MUSCLE SPASMS (Patient taking differently: Take 500 mg by mouth See admin instructions. TAKE ONE TABLET BY MOUTH DAILY AS NEEDED FOR MUSCLE SPASMS) 60 tablet 0   mometasone-formoterol (DULERA) 200-5 MCG/ACT AERO Inhale 2 puffs into the lungs in the morning and at bedtime. 13 g 1   nitroGLYCERIN (NITROSTAT) 0.4 MG SL tablet Place 1 tablet (0.4 mg total) under the tongue every 5 (five) minutes as needed for chest pain. 30 tablet 0   omeprazole (PRILOSEC) 20 MG capsule Take 1 capsule (20 mg total) by mouth daily as needed (acid reflux). 30 capsule 0   PARoxetine (PAXIL) 10 MG tablet Take 1 tablet (10 mg total) by mouth daily. 30 tablet 2   spironolactone (ALDACTONE) 25 MG tablet TAKE ONE TABLET BY MOUTH DAILY 30 tablet 2   valsartan (DIOVAN) 160 MG tablet Take 1 tablet (160 mg total) by mouth daily. 90 tablet 3   vitamin B-12 (CYANOCOBALAMIN) 1000 MCG tablet Take 1,000 mcg by mouth daily.     No current facility-administered medications for this visit.    REVIEW OF SYSTEMS:   10 Point  review of Systems was done is negative except as noted above.  PHYSICAL EXAMINATION: ECOG PERFORMANCE STATUS: 2 - Symptomatic, <50% confined to bed  . Vitals:   09/20/21 1200  BP: (!) 162/82  Pulse: (!) 105  Resp: 18  Temp: (!) 97 F (36.1 C)  SpO2: 100%   Filed Weights   09/20/21 1200  Weight: (!) 342 lb 8 oz (155.4 kg)   .Body mass index is 62.64 kg/m.  NAD GENERAL:alert, in no acute distress and comfortable SKIN: no acute rashes, no significant lesions EYES: conjunctiva are pink and non-injected, sclera anicteric OROPHARYNX: MMM, no exudates, no oropharyngeal erythema or ulceration NECK: supple, no JVD LYMPH:  no palpable lymphadenopathy in the cervical, axillary or inguinal regions LUNGS: clear to auscultation b/l with normal respiratory effort HEART: regular rate & rhythm ABDOMEN:  normoactive bowel sounds , non tender, not distended. Extremity: no pedal edema PSYCH: alert & oriented x 3 with fluent speech NEURO: no focal motor/sensory deficits   LABORATORY DATA:  I have reviewed the data as listed  .    Latest Ref Rng & Units 09/20/2021   11:44 AM 05/29/2021    3:35 AM 05/28/2021    8:51 AM  CBC  WBC 4.0 - 10.5 K/uL 5.9   5.8   4.7    Hemoglobin 12.0 - 15.0 g/dL 11.2   11.3   11.6    Hematocrit 36.0 - 46.0 % 36.8   37.2   39.0  Platelets 150 - 400 K/uL 153   140   129     . CBC    Component Value Date/Time   WBC 5.9 09/20/2021 1144   WBC 5.8 05/29/2021 0335   RBC 4.13 09/20/2021 1144   HGB 11.2 (L) 09/20/2021 1144   HGB 10.7 (L) 03/01/2021 1623   HCT 36.8 09/20/2021 1144   HCT 36.3 03/01/2021 1623   PLT 153 09/20/2021 1144   PLT 177 03/01/2021 1623   MCV 89.1 09/20/2021 1144   MCV 85 03/01/2021 1623   MCH 27.1 09/20/2021 1144   MCHC 30.4 09/20/2021 1144   RDW 14.6 09/20/2021 1144   RDW 13.7 03/01/2021 1623   LYMPHSABS 0.9 09/20/2021 1144   LYMPHSABS 0.5 (L) 11/29/2020 1445   MONOABS 0.5 09/20/2021 1144   EOSABS 0.1 09/20/2021 1144    EOSABS 0.0 11/29/2020 1445   BASOSABS 0.0 09/20/2021 1144   BASOSABS 0.0 11/29/2020 1445    .    Latest Ref Rng & Units 09/20/2021   11:44 AM 09/18/2021    3:39 PM 06/01/2021    4:03 AM  CMP  Glucose 70 - 99 mg/dL 136   82   168    BUN 6 - 20 mg/dL 12   13   27     Creatinine 0.44 - 1.00 mg/dL 0.86   0.93   0.97    Sodium 135 - 145 mmol/L 139   140   131    Potassium 3.5 - 5.1 mmol/L 4.1   4.0   3.6    Chloride 98 - 111 mmol/L 103   101   91    CO2 22 - 32 mmol/L 30   23   33    Calcium 8.9 - 10.3 mg/dL 9.8   9.4   9.2    Total Protein 6.5 - 8.1 g/dL 8.4      Total Bilirubin 0.3 - 1.2 mg/dL 0.6      Alkaline Phos 38 - 126 U/L 78      AST 15 - 41 U/L 12      ALT 0 - 44 U/L 8       . Lab Results  Component Value Date   IRON 46 09/20/2021   TIBC 377 09/20/2021   IRONPCTSAT 12 09/20/2021   (Iron and TIBC)  Lab Results  Component Value Date   FERRITIN 58 09/20/2021    RADIOGRAPHIC STUDIES: I have personally reviewed the radiological images as listed and agreed with the findings in the report.   ASSESSMENT & PLAN:   51 yo with   1) Chronic iron deficiency anemia requiring recurrent IV Iron infusions. Likely from chronic GI losses and previously due to blood loss from venous stasis ulcer in the setting of chronic anticoagulation. 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 Patient Active Problem List   Diagnosis Date Noted   Pain in thoracic spine 09/25/2021   Low back pain 09/25/2021   OSA (obstructive sleep apnea) 07/11/2021   Depression 05/28/2021   Encounter for care related to Port-a-Cath 05/27/2021   Acute on chronic respiratory failure with hypoxia ([place]) 05/26/2021   Morbid obesity with BMI of 50.0-59.9, adult ([place]) 05/26/2021   Acute respiratory failure ([place]) 11/23/2020   Acute respiratory failure with hypoxia ([place]) 11/22/2020    Fever 11/22/2020   Bilateral lower extremity edema 11/22/2020   History of  DVT (deep vein thrombosis) 11/22/2020   Asthma, chronic, moderate persistent, with acute exacerbation 11/22/2020   Lower limb ulcer, calf, left, limited to breakdown of skin ([place]) 06/09/2020   Lymphedema 06/09/2020   Iron deficiency anemia due to chronic blood loss 05/10/2020   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 ([place]) 04/13/2020   Venous insufficiency 04/13/2020   S/P insertion of IVC (inferior vena caval) filter 04/13/2020   Chronic anemia 04/13/2020   Essential hypertension 04/13/2020   (HFpEF) heart failure with preserved ejection fraction (HCC) -> although echo suggests normal diastolic parameters with normal left atrial size 04/13/2020   CAD (coronary artery disease) 11/17/2019   PLAN: -Patient notes no overt GI bleeding. -Her labs done today were reviewed with her in detail. CBC shows mild anemia with a hemoglobin of 11.2 which is gradually tapering down Iron saturation remains low at 12% with a ferritin of 58 CMP unremarkable -Given low iron saturation and ferritin less than 100 despite p.o. iron and recent IV iron replacement and with concerns for ongoing GI losses we will offer the patient an option for additional IV iron at this time. We discussed that she should continue her vitamin B complex to support accelerated erythropoiesis Return to clinic with [name] with labs in 6 months   FOLLOW UP: Portflush in 3 months RTC with Dr Irene Limbo with port flush and labs in 6 months IV Injectafer weekly x2 additional doses  No orders of the defined types were placed in this encounter.  The total time spent in the appointment was 20 minutes*.  All of the patient's questions were answered with apparent satisfaction. The patient knows to call the clinic with any problems, questions or concerns.   Sullivan Lone MD MS AAHIVMS [facility] Hematology/Oncology  Physician [facility]  .*Total Encounter Time as defined by the Centers for Medicare and Medicaid Services includes, in addition to the face-to-face time of a patient visit (documented in the note above) non-face-to-face time: obtaining and reviewing outside history, ordering and reviewing medications, tests or procedures, care coordination (communications with other health care professionals or caregivers) and documentation in the medical record.

## 2021-09-27 ENCOUNTER — Telehealth: Payer: Self-pay | Admitting: Hematology

## 2021-09-27 NOTE — Telephone Encounter (Signed)
.  Called pt per 6/1 inbasket , Patient was unavailable, a message with appt time and date was left with number on file.

## 2021-09-28 ENCOUNTER — Encounter: Payer: Self-pay | Admitting: Dietician

## 2021-09-28 ENCOUNTER — Encounter: Payer: Medicaid Other | Attending: Internal Medicine | Admitting: Dietician

## 2021-09-28 DIAGNOSIS — Z6841 Body Mass Index (BMI) 40.0 and over, adult: Secondary | ICD-10-CM | POA: Diagnosis present

## 2021-09-28 DIAGNOSIS — E119 Type 2 diabetes mellitus without complications: Secondary | ICD-10-CM | POA: Insufficient documentation

## 2021-09-28 NOTE — Progress Notes (Signed)
Medical Nutrition Therapy  Appointment Start time:  1500  Appointment End time:  5102 She was last seen by this RD on 06/21/2021 Primary concerns today: . She would like to lose weight.  She states that she did not qualify for weight loss surgery due to high risk. [name] Referral diagnosis: Type 2 Diabetes with obesity Preferred learning style: no preference indicated Learning readiness: ready   NUTRITION ASSESSMENT  Patient states that she is doing well She states that her biggest barriers are family cookouts. She will get a take out box from the cookouts at times but will wake in the middle of the night and eats this.  She is not hungry at that time. She fasts all day until dinner frequently. Increased stress caring for her mother. She is walking around her apartment complex, gym and wave pool but this is infrequent.  Walking with the dogs 1 hour daily. Decreased intake bread Increased fruits and vegetables Rare chips Drinking more water, Sugar free Energy drink once per day Baked and broiled and less fried foods  She spoke to her brother who is a doctor.  He is a vegetarian.  She is trying to wean off meat.  Anthropometrics  62" 342 lbs 09/28/2021 351 lbs 06/21/2021 and 347 lbs at home  345 lbs 04/27/2021  Obese since 51 yo and gained more weight since her father died in 08/06/03 389 lbs 3 weeks ago States that she lost by stopping eating due to frustration over her weight. Goal:  290 lbs  Clinical Medical Hx: Type 2 Diabetes Aug 05, 2000), OSA (just tested and not on C-pap yet), iron deficient anemia, CAD, cellulitis, CHF Medications: see list to include Farxiga, lasix, spironolactone, iron, vitamin B-12, Klor con, vitamin D Labs: 6.5% 11/24/2020 Notable Signs/Symptoms: 165 fasting glucose  Lifestyle & Dietary Hx Patient lives with her mother.  They share shopping and cooking.   She does not drive and takes an [place] or her mother takes her. She is on food stamps.  Sleep: Now sleeping  well compared to 03/2021 visit:   "not sleeping at all"  4 am-6 am - "so tired, can't fall asleep", Sometimes she is scared to sleep for fear of not waking up. Stress / self-care: high.   Current average weekly physical activity:  Takes them to the dog park (10 blocks away) twice a week and is doing armchair exercise videos 3 times per day.  24-Hr Dietary Recall Lactose intolerant Does not use added salt. Trying to eat 1 meal per day First Meal: coffee with honey and donut Snack:  Second 1/2 vege sub Snack: apple and orange Third Meal: vegetarian pizza Snack: none, trying to avoid Beverages: water, coffee with 1 sugar  NUTRITION DIAGNOSIS  NB-1.1 Food and nutrition-related knowledge deficit As related to balance of carbohydrates, protein, and fat for weight loss.  As evidenced by diet hx and patient report.   NUTRITION INTERVENTION  Nutrition education (E-1) on the following topics:  Encouraged her to continue to stay active and benefits on blood glucose Encouraged her to continue lifestyle changes  Reviewed mindfulness, choices and hunger and fullness cues Reviewed basic meal planning with focus on meatless meals as well as meals with meat as patient is doing both  Handouts Provided Include:initial visit  Anemia nutrition Therapy from AND Snack sheet My plate ADA book How to Thrive:  A guide for your journey with diabetes Diabetes resource page Heart healthy, consistent carbohydrate nutrition therapy from AND Meal plan card 1500 calorie  sample meal plan from AND (5 day)  Learning Style & Readiness for Change Teaching method utilized: Visual & Auditory  Demonstrated degree of understanding via: Teach Back  Barriers to learning/adherence to lifestyle change: lifelong habits, motivation  Goals Consider looking for the following book at your public library:  Engine 2 seven day rescue diet  Engine 2 cookbook  Forks over [employer], lunch, dinner daily. Listen  to your body.  Eat slowly and stop when satisfied/full. Eat mostly plants Small amount of lean protein at each meal Stay active! Stay hydrated.  Continue to drink plenty of water.  Continue to make changes that you can stick with.  MONITORING & EVALUATION Dietary intake, weekly physical activity, and label reading in 2 months.  Next Steps  Patient is to call for questions.

## 2021-09-28 NOTE — Patient Instructions (Addendum)
Consider looking for the following book at your public [place]:  Engine 2 seven day rescue diet  Engine 2 cookbook  Forks over [employer], lunch, dinner daily. Listen to your body.  Eat slowly and stop when satisfied/full. Eat mostly plants Small amount of lean protein at each meal Stay active! Stay hydrated.  Continue to drink plenty of water.  Continue to make changes that you can stick with.

## 2021-10-01 ENCOUNTER — Other Ambulatory Visit: Payer: Self-pay | Admitting: Internal Medicine

## 2021-10-01 DIAGNOSIS — F411 Generalized anxiety disorder: Secondary | ICD-10-CM

## 2021-10-02 ENCOUNTER — Other Ambulatory Visit: Payer: Self-pay | Admitting: Internal Medicine

## 2021-10-02 DIAGNOSIS — Z86718 Personal history of other venous thrombosis and embolism: Secondary | ICD-10-CM

## 2021-10-02 NOTE — Telephone Encounter (Signed)
Requested Prescriptions  Pending Prescriptions Disp Refills  . ELIQUIS 5 MG TABS tablet [Pharmacy Med Name: ELIQUIS 5 MG TABLET] 60 tablet 3    Sig: TAKE ONE TABLET BY MOUTH TWICE A DAY     Hematology:  Anticoagulants - apixaban Failed - 10/02/2021  6:21 AM      Failed - HGB in normal range and within 360 days    Hemoglobin  Date Value Ref Range Status  09/20/2021 11.2 (L) 12.0 - 15.0 g/dL Final  03/01/2021 10.7 (L) 11.1 - 15.9 g/dL Final         Failed - AST in normal range and within 360 days    AST  Date Value Ref Range Status  09/20/2021 12 (L) 15 - 41 U/L Final         Passed - PLT in normal range and within 360 days    Platelets  Date Value Ref Range Status  03/01/2021 177 150 - 450 x10E3/uL Final   Platelet Count  Date Value Ref Range Status  09/20/2021 153 150 - 400 K/uL Final         Passed - HCT in normal range and within 360 days    HCT  Date Value Ref Range Status  09/20/2021 36.8 36.0 - 46.0 % Final   Hematocrit  Date Value Ref Range Status  03/01/2021 36.3 34.0 - 46.6 % Final         Passed - Cr in normal range and within 360 days    Creatinine  Date Value Ref Range Status  09/20/2021 0.86 0.44 - 1.00 mg/dL Final         Passed - ALT in normal range and within 360 days    ALT  Date Value Ref Range Status  09/20/2021 8 0 - 44 U/L Final         Passed - Valid encounter within last 12 months    Recent Outpatient Visits          5 months ago Chronic bilateral thoracic back pain   [facility], Kriste Basque, NP   7 months ago Iron deficiency anemia due to chronic blood loss   [place], Deborah B, MD   9 months ago Preoperative evaluation to rule out surgical contraindication   [place] Ladell Pier, MD   10 months ago Type 2 diabetes mellitus with hyperglycemia, unspecified whether long term insulin use [facility])   [place], [place]   1 year ago Chronic midline thoracic back pain   [place], [place]      Future Appointments            In 1 month [name] Dalbert Batman, MD [place]   In 3 months Ellyn Hack, Leonie Green, MD [facility] [place], [facility]

## 2021-10-04 ENCOUNTER — Inpatient Hospital Stay: Payer: Medicaid Other | Attending: Hematology

## 2021-10-04 VITALS — BP 125/75 | HR 95 | Temp 98.2°F | Resp 18

## 2021-10-04 DIAGNOSIS — D500 Iron deficiency anemia secondary to blood loss (chronic): Secondary | ICD-10-CM

## 2021-10-04 DIAGNOSIS — D509 Iron deficiency anemia, unspecified: Secondary | ICD-10-CM | POA: Diagnosis present

## 2021-10-04 MED ORDER — SODIUM CHLORIDE 0.9 % IV SOLN: INTRAVENOUS | Status: DC

## 2021-10-04 MED ORDER — SODIUM CHLORIDE 0.9 % IV SOLN
750.0000 mg | Freq: Once | INTRAVENOUS | Status: AC
  Administered 2021-10-04: 750 mg via INTRAVENOUS
  Filled 2021-10-04: qty 15

## 2021-10-04 NOTE — Patient Instructions (Signed)
Ferric Carboxymaltose Injection What is this medication? FERRIC CARBOXYMALTOSE (FER ik kar BOX ee MAWL tose) treats low levels of iron in your body (iron deficiency anemia). Iron is a mineral that plays an important role in making red blood cells, which carry oxygen from your lungs to the rest of your body. This medicine may be used for other purposes; ask your health care provider or pharmacist if you have questions. COMMON BRAND NAME(S): Injectafer What should I tell my care team before I take this medication? They need to know if you have any of these conditions: High blood pressure High levels of iron in the blood An unusual or allergic reaction to iron, other medications, foods, dyes, or preservatives Pregnant or trying to get pregnant Breast-feeding How should I use this medication? This medication is injected into a vein. It is given by your care team in a hospital or clinic setting. Talk to your care team about the use of this medication in children. While it may be given to children as young as 1 year for selected conditions, precautions do apply. Overdosage: If you think you have taken too much of this medicine contact a poison control center or emergency room at once. NOTE: This medicine is only for you. Do not share this medicine with others. What if I miss a dose? Keep appointments for follow-up doses. It is important not to miss your dose. Call your care team if you are unable to keep an appointment. What may interact with this medication? Do not take this medication with any of the following medications: Deferoxamine Dimercaprol Other iron products This list may not describe all possible interactions. Give your health care provider a list of all the medicines, herbs, non-prescription drugs, or dietary supplements you use. Also tell them if you smoke, drink alcohol, or use illegal drugs. Some items may interact with your medicine. What should I watch for while using this  medication? Visit your care team for regular checks on your progress. Tell your care team if your symptoms do not start to get better or if they get worse. You may need blood work while you are taking this medication. You may need to eat more foods that contain iron. Talk to your care team. Foods that contain iron include whole grains/cereals, dried fruits, beans, or peas, leafy green vegetables, and organ meats (liver, kidney). What side effects may I notice from receiving this medication? Side effects that you should report to your care team as soon as possible: Allergic reactions--skin rash, itching, hives, swelling of the face, lips, tongue, or throat Increase in blood pressure Low blood pressure--dizziness, feeling faint or lightheaded, blurry vision Shortness of breath Side effects that usually do not require medical attention (report to your care team if they continue or are bothersome): Flushing Headache Joint pain Muscle pain Nausea Pain, redness, or irritation at injection site This list may not describe all possible side effects. Call your doctor for medical advice about side effects. You may report side effects to FDA at 1-800-FDA-1088. Where should I keep my medication? This medication is given in a hospital or clinic. It will not be stored at home. NOTE: This sheet is a summary. It may not cover all possible information. If you have questions about this medicine, talk to your doctor, pharmacist, or health care provider.  2023 Elsevier/Gold Standard (2020-09-08 00:00:00)  

## 2021-10-04 NOTE — Progress Notes (Signed)
Pt observed for 30 minutes post infusion. VSS. No complaints at time of discharge.

## 2021-10-09 ENCOUNTER — Other Ambulatory Visit: Payer: Self-pay | Admitting: Obstetrics and Gynecology

## 2021-10-09 NOTE — Patient Outreach (Signed)
Care Coordination  10/09/2021  [name] February 03, 1971 [id number]   Medicaid Managed Care   Unsuccessful Outreach Note  10/09/2021 Name: [name] MRN: [id number] DOB: Jul 18, 1970  Referred by: Ladell Pier, MD Reason for referral : High Risk Managed Medicaid (Unsuccessful telephone outreach)   An unsuccessful telephone outreach was attempted today. The patient was referred to the case management team for assistance with care management and care coordination.   Follow Up Plan: The care management team will reach out to the patient again over the next 30 business  days.   [name], BSN [place]  [facility] [employer] - Managed Medicaid High Risk [phone]

## 2021-10-09 NOTE — Patient Instructions (Signed)
Hi Ms. Dobesh, I am sorry I was unable to reach you today - as a part of your Medicaid benefit, you are eligible for care management and care coordination services at no cost or copay. I was unable to reach you by phone today but would be happy to help you with your health related needs. Please feel free to call me at [phone]  A member of the Managed Medicaid care management team will reach out to you again over the next 30 business days.   [name], BSN [place]  [facility] [employer] - Managed Medicaid High Risk [phone]

## 2021-10-10 ENCOUNTER — Other Ambulatory Visit: Payer: Medicaid Other | Admitting: Obstetrics and Gynecology

## 2021-10-10 ENCOUNTER — Ambulatory Visit
Admission: RE | Admit: 2021-10-10 | Discharge: 2021-10-10 | Disposition: A | Discharge status: Home / Self Care | Payer: Medicaid Other | Source: Ambulatory Visit | Attending: Orthopaedic Surgery | Admitting: Orthopaedic Surgery

## 2021-10-10 DIAGNOSIS — R200 Anesthesia of skin: Secondary | ICD-10-CM | POA: Diagnosis not present

## 2021-10-10 DIAGNOSIS — M545 Low back pain, unspecified: Secondary | ICD-10-CM | POA: Diagnosis not present

## 2021-10-10 DIAGNOSIS — M5440 Lumbago with sciatica, unspecified side: Secondary | ICD-10-CM

## 2021-10-10 DIAGNOSIS — M48061 Spinal stenosis, lumbar region without neurogenic claudication: Secondary | ICD-10-CM | POA: Diagnosis not present

## 2021-10-10 NOTE — Patient Outreach (Signed)
Care Coordination  10/10/2021  [name] 12/01/1970 [id number]  RNCM returned patient's phone call.  Patient answered and stated she was at an appointment,  asked to be called another time.  [name], BSN [place]  [facility] [employer] - Managed Medicaid High Risk [phone]

## 2021-10-11 ENCOUNTER — Inpatient Hospital Stay: Payer: Medicaid Other

## 2021-10-11 VITALS — BP 108/68 | HR 90 | Temp 97.6°F | Resp 18

## 2021-10-11 DIAGNOSIS — D500 Iron deficiency anemia secondary to blood loss (chronic): Secondary | ICD-10-CM

## 2021-10-11 DIAGNOSIS — D509 Iron deficiency anemia, unspecified: Secondary | ICD-10-CM | POA: Diagnosis not present

## 2021-10-11 DIAGNOSIS — D649 Anemia, unspecified: Secondary | ICD-10-CM

## 2021-10-11 MED ORDER — SODIUM CHLORIDE 0.9 % IV SOLN: INTRAVENOUS | Status: DC

## 2021-10-11 MED ORDER — SODIUM CHLORIDE 0.9 % IV SOLN
750.0000 mg | Freq: Once | INTRAVENOUS | Status: AC
  Administered 2021-10-11: 750 mg via INTRAVENOUS
  Filled 2021-10-11: qty 15

## 2021-10-11 NOTE — Patient Instructions (Signed)
Ferric Carboxymaltose Injection What is this medication? FERRIC CARBOXYMALTOSE (FER ik kar BOX ee MAWL tose) treats low levels of iron in your body (iron deficiency anemia). Iron is a mineral that plays an important role in making red blood cells, which carry oxygen from your lungs to the rest of your body. This medicine may be used for other purposes; ask your health care provider or pharmacist if you have questions. COMMON BRAND NAME(S): Injectafer What should I tell my care team before I take this medication? They need to know if you have any of these conditions: High blood pressure High levels of iron in the blood An unusual or allergic reaction to iron, other medications, foods, dyes, or preservatives Pregnant or trying to get pregnant Breast-feeding How should I use this medication? This medication is injected into a vein. It is given by your care team in a hospital or clinic setting. Talk to your care team about the use of this medication in children. While it may be given to children as young as 1 year for selected conditions, precautions do apply. Overdosage: If you think you have taken too much of this medicine contact a poison control center or emergency room at once. NOTE: This medicine is only for you. Do not share this medicine with others. What if I miss a dose? Keep appointments for follow-up doses. It is important not to miss your dose. Call your care team if you are unable to keep an appointment. What may interact with this medication? Do not take this medication with any of the following medications: Deferoxamine Dimercaprol Other iron products This list may not describe all possible interactions. Give your health care provider a list of all the medicines, herbs, non-prescription drugs, or dietary supplements you use. Also tell them if you smoke, drink alcohol, or use illegal drugs. Some items may interact with your medicine. What should I watch for while using this  medication? Visit your care team for regular checks on your progress. Tell your care team if your symptoms do not start to get better or if they get worse. You may need blood work while you are taking this medication. You may need to eat more foods that contain iron. Talk to your care team. Foods that contain iron include whole grains/cereals, dried fruits, beans, or peas, leafy green vegetables, and organ meats (liver, kidney). What side effects may I notice from receiving this medication? Side effects that you should report to your care team as soon as possible: Allergic reactions--skin rash, itching, hives, swelling of the face, lips, tongue, or throat Increase in blood pressure Low blood pressure--dizziness, feeling faint or lightheaded, blurry vision Shortness of breath Side effects that usually do not require medical attention (report to your care team if they continue or are bothersome): Flushing Headache Joint pain Muscle pain Nausea Pain, redness, or irritation at injection site This list may not describe all possible side effects. Call your doctor for medical advice about side effects. You may report side effects to FDA at 1-800-FDA-1088. Where should I keep my medication? This medication is given in a hospital or clinic. It will not be stored at home. NOTE: This sheet is a summary. It may not cover all possible information. If you have questions about this medicine, talk to your doctor, pharmacist, or health care provider.  2023 Elsevier/Gold Standard (2020-09-08 00:00:00)  

## 2021-10-12 ENCOUNTER — Telehealth (HOSPITAL_BASED_OUTPATIENT_CLINIC_OR_DEPARTMENT_OTHER): Payer: Medicaid Other | Admitting: Internal Medicine

## 2021-10-12 DIAGNOSIS — M546 Pain in thoracic spine: Secondary | ICD-10-CM

## 2021-10-12 DIAGNOSIS — G8929 Other chronic pain: Secondary | ICD-10-CM | POA: Diagnosis not present

## 2021-10-12 DIAGNOSIS — Z760 Encounter for issue of repeat prescription: Secondary | ICD-10-CM | POA: Diagnosis not present

## 2021-10-12 MED ORDER — ATORVASTATIN CALCIUM 40 MG PO TABS: 40.0000 mg | ORAL_TABLET | Freq: Every day | ORAL | 0 refills | Status: DC

## 2021-10-12 MED ORDER — GABAPENTIN 100 MG PO CAPS: 100.0000 mg | ORAL_CAPSULE | Freq: Three times a day (TID) | ORAL | 2 refills | Status: DC

## 2021-10-12 MED ORDER — METHOCARBAMOL 500 MG PO TABS: 500.0000 mg | ORAL_TABLET | Freq: Two times a day (BID) | ORAL | 1 refills | Status: DC | PRN

## 2021-10-12 MED ORDER — AMLODIPINE BESYLATE 5 MG PO TABS: 2.5000 mg | ORAL_TABLET | Freq: Every day | ORAL | 0 refills | Status: DC

## 2021-10-12 MED ORDER — PAROXETINE HCL 10 MG PO TABS: 10.0000 mg | ORAL_TABLET | Freq: Every day | ORAL | 1 refills | Status: DC

## 2021-10-12 NOTE — Progress Notes (Signed)
Patient ID: [name], female   DOB: October 05, 1970, 51 y.o.   MRN: [id number] Virtual Visit via Telephone Note  I connected with [name] on 10/12/2021 at 4:50 PM by telephone and verified that I am speaking with the correct person using two identifiers  Location: Patient: home Provider: office  Participants: Myself Patient CMA: [name] interpreter:   I discussed the limitations, risks, security and privacy concerns of performing an evaluation and management service by telephone and the availability of in person appointments. I also discussed with the patient that there may be a patient responsible charge related to this service. The patient expressed understanding and agreed to proceed.   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  EGD and colonoscopy negative, chronic ulcer left lower extremity, asthma   This was an urgent care visit for medication refill. Patient requesting refill on gabapentin, methocarbamol, Paxil.  I noted that she also needed refill on amlodipine and Lipitor. She tells me that she has been seeing orthopedics for her back pain and has had MRI done.  She is scheduled for injections to the back.  Wanting to know if the methocarbamol can be increased.  Outpatient Encounter Medications as of 10/12/2021  Medication Sig   Accu-Chek Softclix Lancets lancets Use as instructed (Patient taking differently: 1 each by Other route See admin instructions. Use as instructed)   acetaminophen (TYLENOL) 500 MG tablet Take 1 tablet (500 mg total) by mouth every 6 (six) hours as needed. (Patient taking differently: Take 500 mg by mouth every 6 (six) hours as needed for mild pain.)    acetaminophen (TYLENOL) 650 MG CR tablet    albuterol (PROAIR HFA) 108 (90 Base) MCG/ACT inhaler INHALE TWO PUFFS BY MOUTH EVERY 6 HOURS AS NEEDED FOR WHEEZING OR SHORTNESS OF BREATH (Patient taking differently: Inhale 2 puffs into the lungs See admin instructions. INHALE TWO PUFFS BY MOUTH EVERY 6 HOURS AS NEEDED FOR WHEEZING OR SHORTNESS OF BREATH)   amLODipine (NORVASC) 5 MG tablet Take 0.5 tablets (2.5 mg total) by mouth daily.   atorvastatin (LIPITOR) 40 MG tablet Take 1 tablet (40 mg total) by mouth daily.   Blood Glucose Calibration (ACCU-CHEK GUIDE CONTROL) LIQD 1 each by In Vitro route daily.   Blood Glucose Monitoring Suppl (ACCU-CHEK GUIDE) w/Device KIT 1 each by Does not apply route 2 (two) times daily.   busPIRone (BUSPAR) 7.5 MG tablet Take 1 tablet by mouth 2 (two) times daily.   Carboxymethylcellulose Sodium (EYE DROPS OP) Place 1 drop into both eyes daily as needed (dry eyes).   carvedilol (COREG) 25 MG tablet TAKE ONE TABLET BY MOUTH TWICE A DAY WITH MEALS   cholecalciferol (VITAMIN D3) 25 MCG (1000 UNIT) tablet Take 1 tablet (1,000 Units total) by mouth daily.   COVID-19 mRNA Vac-TriS, Pfizer, (PFIZER-BIONT COVID-19 VAC-TRIS) SUSP injection Inject into the muscle.   dapagliflozin propanediol (FARXIGA) 10 MG TABS tablet Take 1 tablet (10 mg total) by mouth daily.   ELIQUIS 5 MG TABS tablet TAKE ONE TABLET BY MOUTH TWICE A DAY   FEROSUL 325 (65 Fe) MG tablet TAKE ONE TABLET BY MOUTH DAILY WITH BREAKFAST   furosemide (LASIX) 80 MG tablet Take 1 tablet (80 mg total) by mouth daily. Take additional  dose of lasix for weight gain, leg swelling   gabapentin (NEURONTIN) 100 MG capsule TAKE 1 CAPSULE BY MOUTH THREE TIMES A DAY AS NEEDED FOR PAN (Patient taking differently: Take 100 mg by mouth 3 (three) times daily as needed (pain).)   glucose blood test strip Use as instructed (Patient taking differently: 1 each by Other route See admin instructions. Use as instructed)   hydrocortisone  cream 1 % Apply 1 application topically daily as needed for itching.   hydrOXYzine (VISTARIL) 25 MG capsule Take 1 capsule (25 mg total) by mouth at bedtime as needed. (Patient taking differently: Take 25 mg by mouth at bedtime as needed for anxiety or itching.)   KLOR-CON M20 20 MEQ tablet TAKE ONE TABLET BY MOUTH DAILY (Patient taking differently: Take 20 mEq by mouth daily.)   methocarbamol (ROBAXIN) 500 MG tablet TAKE ONE TABLET BY MOUTH DAILY AS NEEDED FOR MUSCLE SPASMS (Patient taking differently: Take 500 mg by mouth See admin instructions. TAKE ONE TABLET BY MOUTH DAILY AS NEEDED FOR MUSCLE SPASMS)   mometasone-formoterol (DULERA) 200-5 MCG/ACT AERO Inhale 2 puffs into the lungs in the morning and at bedtime.   nitroGLYCERIN (NITROSTAT) 0.4 MG SL tablet Place 1 tablet (0.4 mg total) under the tongue every 5 (five) minutes as needed for chest pain.   omeprazole (PRILOSEC) 20 MG capsule Take 1 capsule (20 mg total) by mouth daily as needed (acid reflux).   PARoxetine (PAXIL) 10 MG tablet TAKE ONE TABLET BY MOUTH DAILY   spironolactone (ALDACTONE) 25 MG tablet TAKE ONE TABLET BY MOUTH DAILY   valsartan (DIOVAN) 160 MG tablet Take 1 tablet (160 mg total) by mouth daily.   vitamin B-12 (CYANOCOBALAMIN) 1000 MCG tablet Take 1,000 mcg by mouth daily.   No facility-administered encounter medications on file as of 10/12/2021.      Observations/Objective: No direct observation done as this was a telephone visit.  Assessment and Plan: 1. Medication refill Medication refills sent as requested.  2. Chronic midline thoracic back pain I have changed the Robaxin to 500 mg twice a day as needed.   Follow Up Instructions: I will have our schedulers reach out to her to schedule follow-up appointment for chronic disease management.   I discussed the assessment and treatment plan with the patient. The patient was provided an opportunity to ask questions and all were answered. The patient agreed with  the plan and demonstrated an understanding of the instructions.   The patient was advised to call back or seek an in-person evaluation if the symptoms worsen or if the condition fails to improve as anticipated.  I  Spent 7 minutes on this telephone encounter  This note has been created with Surveyor, quantity. Any transcriptional errors are unintentional.  [name], MD

## 2021-10-17 ENCOUNTER — Encounter: Payer: Self-pay | Admitting: Orthopaedic Surgery

## 2021-10-17 ENCOUNTER — Ambulatory Visit (INDEPENDENT_AMBULATORY_CARE_PROVIDER_SITE_OTHER): Payer: Medicaid Other | Admitting: Orthopaedic Surgery

## 2021-10-17 VITALS — Ht 62.0 in | Wt 353.0 lb

## 2021-10-17 DIAGNOSIS — M5440 Lumbago with sciatica, unspecified side: Secondary | ICD-10-CM

## 2021-10-17 NOTE — Progress Notes (Signed)
Office Visit Note   Patient: [name]           Date of Birth: 1970/11/12           MRN: [id number] Visit Date: 10/17/2021              Requested by: Ladell Pier, MD [place],  [place] [id number] PCP: Ladell Pier, MD   Assessment & Plan: Visit Diagnoses:  1. Acute bilateral low back pain with sciatica, sciatica laterality unspecified     Plan: Patient follows up today for her lower back pain.  She focuses her pain in her lower back it does not radiate down the lower extremities.  She is here to review the MRI today.  The MRI shows foraminal narrowing and moderate to severe.  She also has anterior listhesis as well as facet arthropathy.  She may benefit from a epidural steroid injection we will refer her for this.  She is a diabetic she is due to see her primary care doctor this month and has not had an A1c in a year.  Last 1 that I could find in 2022 was at 6.5.  She has a BMI of 64.5.  We had a long discussion about weight loss.  She has worked currently just with a nutritionist but not a comprehensive bariatric evaluation.  I think given her difficulties I think this would be medically necessary for her at this point.  She agrees and is willing to do this.  Follow-Up Instructions: No follow-ups on file.   Orders:  Orders Placed This Encounter  Procedures   Epidural Steroid Injection - Lumbar/Sacral (Ancillary Performed)   Amb Ref to Medical Weight Management   No orders of the defined types were placed in this encounter.     Procedures: No procedures performed   Clinical Data: No additional findings.   Subjective: Chief Complaint  Patient presents with   Lower Back - Follow-up  Patient presents today for a three week follow up on her lower back. She states that she is taking Tylenol Extra Strength, and it does help a little.    Review of Systems  All other systems reviewed and are negative.    Objective: Vital  Signs: Ht 5\' 2"  (1.575 m)   Wt (!) 353 lb (160.1 kg)   BMI 64.56 kg/m   Physical Exam Constitutional:      Appearance: Normal appearance.  Pulmonary:     Effort: Pulmonary effort is normal.  Neurological:     Mental Status: She is alert.     Ortho Exam Examination of her lower back she has focal tenderness in the lower spine.  No radiation down her legs.  She has no defect with palpation.  She has 5 out of 5 strength with flexion extension of her ankles legs and flexion of her hips.  Negative straight leg raise.  Sensation is intact.  Of note she does have lymphedema wraps on Specialty Comments:  No specialty comments available.  Imaging: No results found.   PMFS History: Patient Active Problem List   Diagnosis Date Noted   Pain in thoracic spine 09/25/2021   Low back pain 09/25/2021   OSA (obstructive sleep apnea) 07/11/2021   Depression 05/28/2021   Encounter for care related to Port-a-Cath 05/27/2021   Acute on chronic respiratory failure with hypoxia ([place]) 05/26/2021   Morbid obesity with BMI of 50.0-59.9, adult ([place]) 05/26/2021   Acute respiratory failure ([name]) 11/23/2020  Acute respiratory failure with hypoxia ([place]) 11/22/2020   Fever 11/22/2020   Bilateral lower extremity edema 11/22/2020   History of DVT (deep vein thrombosis) 11/22/2020   Asthma, chronic, moderate persistent, with acute exacerbation 11/22/2020   Lower limb ulcer, calf, left, limited to breakdown of skin (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 ([place]) 04/13/2020   Venous insufficiency 04/13/2020   S/P insertion of IVC (inferior vena caval) filter 04/13/2020   Chronic anemia 04/13/2020   Essential hypertension 04/13/2020   (HFpEF) heart failure with preserved ejection fraction (HCC) -> although echo suggests normal diastolic parameters with normal left  atrial size 04/13/2020   CAD (coronary artery disease) 11/17/2019   Past Medical History:  Diagnosis Date   (HFpEF) heart failure with preserved ejection fraction (HCC) -> although echo suggests normal diastolic parameters with normal left atrial size 04/13/2020   Cellulitis    Coronary artery disease involving native coronary artery without angina pectoris    PT STATES - HAD A CARDIAC CATH - NOT TOLD SHE HAD CAD -> week note from [place] indicates history of MI (patient cannot corroborate   Diabetes mellitus without complication ([place])    DVT (deep venous thrombosis) ([place]) 09/17/2017   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   Morbid obesity due to excess calories (HCC)    OSA (obstructive sleep apnea) 07/11/2021   Sleep study April 06, 2019 (Dr. Halford Chessman): Moderate OSA (AHI of 24.4 and SPO2 low of 77%).  Majority events during REM sleep.-recommend CPAP, oral appliance or surgical. Reviewed sleep hygiene.  Avoid sedatives.     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  No evidence of deep venous insufficiency bilaterally.  No evidence of SSV reflux.  Right GSV in the calf has reflux, no reflux in L GSV.;  Repeated in July 2020-no DVT   [place]  2014   TRANSTHORACIC ECHOCARDIOGRAM  11/25/2020   EF 55 to 60%.  Normal LV function.  No RWM A.  Mild LVH.  GR 1 DD.  Normal PAP.  Normal aortic and mitral valve.  Mildly elevated RAP  ~8 mmHg.   Social History   Occupational History  Occupation: unemployed on disablity  Tobacco Use   Smoking status: Former   Smokeless tobacco: Never  Scientific laboratory technician Use: Never used  Substance and Sexual Activity   Alcohol use: Not Currently   Drug use: Not Currently   Sexual activity: Not Currently

## 2021-10-18 ENCOUNTER — Other Ambulatory Visit: Payer: Self-pay | Admitting: Internal Medicine

## 2021-10-22 ENCOUNTER — Other Ambulatory Visit: Payer: Self-pay | Admitting: Internal Medicine

## 2021-10-31 ENCOUNTER — Telehealth: Payer: Self-pay

## 2021-10-31 MED ORDER — DIPHENHYDRAMINE HCL 50 MG PO TABS: 50.0000 mg | ORAL_TABLET | Freq: Once | ORAL | 0 refills | Status: DC

## 2021-10-31 MED ORDER — PREDNISONE 50 MG PO TABS: ORAL_TABLET | ORAL | 0 refills | Status: DC

## 2021-10-31 NOTE — Telephone Encounter (Signed)
Phone call to patient to review instructions for 13 hr prep for lumbar epidural injection w/ contrast on 11/06/21 @ 8:30 AM. Prescription called into [facility]. Pt aware and verbalized understanding of instructions.  Prescription: Pt to take 50 mg of prednisone on 11/05/21 at 7:30 PM, 50 mg of prednisone on 11/06/21 at 1:30 AM, and 50 mg of prednisone on 11/06/21 at 7:30 AM. Pt is also to take 50 mg of benadryl on 11/06/21 at 7:30 AM. Please call [phone] with any questions.    Benadryl also called in as a prescription, per the patients request.

## 2021-11-06 ENCOUNTER — Ambulatory Visit
Admission: RE | Admit: 2021-11-06 | Discharge: 2021-11-06 | Disposition: A | Discharge status: Home / Self Care | Payer: Medicaid Other | Source: Ambulatory Visit | Attending: Orthopaedic Surgery | Admitting: Orthopaedic Surgery

## 2021-11-06 DIAGNOSIS — M48061 Spinal stenosis, lumbar region without neurogenic claudication: Secondary | ICD-10-CM | POA: Diagnosis not present

## 2021-11-06 DIAGNOSIS — M47816 Spondylosis without myelopathy or radiculopathy, lumbar region: Secondary | ICD-10-CM | POA: Diagnosis not present

## 2021-11-06 DIAGNOSIS — M5440 Lumbago with sciatica, unspecified side: Secondary | ICD-10-CM

## 2021-11-06 MED ORDER — METHYLPREDNISOLONE ACETATE 40 MG/ML INJ SUSP (RADIOLOG
80.0000 mg | Freq: Once | INTRAMUSCULAR | Status: AC
  Administered 2021-11-06: 80 mg via EPIDURAL

## 2021-11-06 MED ORDER — IOPAMIDOL (ISOVUE-M 200) INJECTION 41%
1.0000 mL | Freq: Once | INTRAMUSCULAR | Status: AC
  Administered 2021-11-06: 1 mL via EPIDURAL

## 2021-11-06 NOTE — Discharge Instructions (Signed)

## 2021-11-07 ENCOUNTER — Other Ambulatory Visit: Payer: Self-pay | Admitting: Internal Medicine

## 2021-11-07 DIAGNOSIS — I5032 Chronic diastolic (congestive) heart failure: Secondary | ICD-10-CM

## 2021-11-07 DIAGNOSIS — E1169 Type 2 diabetes mellitus with other specified complication: Secondary | ICD-10-CM

## 2021-11-07 DIAGNOSIS — Z8709 Personal history of other diseases of the respiratory system: Secondary | ICD-10-CM

## 2021-11-07 MED ORDER — DAPAGLIFLOZIN PROPANEDIOL 10 MG PO TABS: 10.0000 mg | ORAL_TABLET | Freq: Every day | ORAL | 0 refills | Status: DC

## 2021-11-07 MED ORDER — DULERA 200-5 MCG/ACT IN AERO: 2.0000 | INHALATION_SPRAY | Freq: Two times a day (BID) | RESPIRATORY_TRACT | 1 refills | Status: DC

## 2021-11-12 ENCOUNTER — Other Ambulatory Visit: Payer: Self-pay | Admitting: Internal Medicine

## 2021-11-12 DIAGNOSIS — Z8709 Personal history of other diseases of the respiratory system: Secondary | ICD-10-CM

## 2021-11-13 ENCOUNTER — Other Ambulatory Visit: Payer: Self-pay | Admitting: Obstetrics and Gynecology

## 2021-11-13 NOTE — Patient Instructions (Signed)
Hey [name], have a great afternoon and thanks for speaking with me today!!  [name] was given information about Medicaid Managed Care team care coordination services as a part of their [place] Medicaid benefit. [name] verbally consented to engagement with the [facility] Managed Care team.   If you are experiencing a medical emergency, please call 911 or report to your local emergency department or urgent care.   If you have a non-emergency medical problem during routine business hours, please contact your provider's office and ask to speak with a nurse.   For questions related to your [facility], please call: [phone] or visit the homepage here: [email or web address]  If you would like to schedule transportation through your [facility], please call the following number at least 2 days in advance of your appointment: [phone]   Rides for urgent appointments can also be made after hours by calling Member Services.  Call the [place] at [phone], at any time, 24 hours a day, 7 days a week. If you are in danger or need immediate medical attention call 911.  If you would like help to quit smoking, call 1-800-QUIT-NOW 571-623-2346) OR Espaol: 1-855-Djelo-Ya (9-937-169-6789) o para ms informacin haga clic aqu or Text READY to 200-400 to register via text  [name] - following are the goals we discussed in your visit today:   Goals Addressed    Timeframe:  Long-Range Goal Priority:  High Start Date:     07/02/21                        Expected End Date:     ongoing                  Follow Up Date 12/14/21  - schedule appointment for flu shot - schedule appointment for vaccines needed due to my age or health - schedule recommended health tests (blood work, mammogram, colonoscopy, pap test) - schedule  and keep appointment for annual check-up    Why is this important?   Screening tests can find diseases early when they are easier to treat.  Your doctor or nurse will talk with you about which tests are important for you.  Getting shots for common diseases like the flu and shingles will help prevent them.    11/13/21:  Patient with upcoming PCP appt 7/27  Patient verbalizes understanding of instructions and care plan provided today and agrees to view in [place]. Active MyChart status and patient understanding of how to access instructions and care plan via MyChart confirmed with patient.     The Managed Medicaid care management team will reach out to the patient again over the next 30 days.  The  Patient has been provided with contact information for the Managed Medicaid care management team and has been advised to call with any health related questions or concerns.   Megan Raider RN, BSN [place] Management Coordinator - Managed Medicaid High Risk [phone]   Following is a copy of your plan of care:  Care Plan : [place] of Care  Updates made by Megan Medicus, RN since 11/13/2021 12:00 AM     Problem: Chronic Disease Management and Care Coordination Needs   Priority: High     Long-Range Goal: Self-Management Plan Developed   Start Date: 07/02/2021  Expected End Date: 01/07/2022  Recent Progress:  Not on track  Priority: High  Note:   Current Barriers:  Knowledge Deficits related to plan of care for management of back pain. lymphedema Care Coordination needs related to Limited support and back pain Chronic Disease Management support and education needs related to back pain. Lymphedema  11/13/21:  Patient managing well today.  BP elevated-164/93-to recheck-has f/u with PCP 7/27.  Patient received back injections with some relief.    RNCM Clinical Goal(s):  Patient will verbalize understanding of plan for management of back pain,  lymphedema  as evidenced by patient report and attending PT sessions take all medications exactly as prescribed and will call provider for medication related questions as evidenced by patient report attend all scheduled medical appointments as evidenced by patient report continue to work with RN Care Manager to address care management and care coordination needs related to  back pain, lymphedema as evidenced by adherence to CM Team Scheduled appointments work with social worker to address  anxiety.  Limited support and hisory of anxiety/depression related to the management of back pain  as evidenced by review of EMR and patient or social worker report through collaboration with Consulting civil engineer, provider, and care team.   Interventions: Inter-disciplinary care team collaboration (see longitudinal plan of care) Evaluation of current treatment plan related to  self management and patient's adherence to plan as established by provider Collaborated with [facility] to schedule an appt for her Collaborated with SW for anxiety SW referral for anxiety 08/29/21: BSW completed telephone outreach with patient. She stated she was looking for more of an aide to assistance with her mother. Due to her having appointments and they have 2 dogs her mom is not always able to take both of them out. BSW provided patient with the telephone number for [employer] of [place]. BSW also informed patient she would need to speak with her moms PCP to have a prescription sent in to her insurance company for long term aide services. No other resources needed at this time.  09/18/21: BSW completed telephone outreach with patient. She stated her mom decided she would go through her PCP to start LTD services. Patient did state she is having a hard time getting refills for one of her medications but does not remember the name it. Patient states no other resources are needed at this time.   Pain Interventions:  (Status:   New goal.) Long Term Goal Pain assessment performed Medications reviewed Reviewed provider established plan for pain management Discussed importance of adherence to all scheduled medical appointments Counseled on the importance of reporting any/all new or changed pain symptoms or management strategies to pain management provider Advised patient to report to care team affect of pain on daily activities Discussed use of relaxation techniques and/or diversional activities to assist with pain reduction (distraction, imagery, relaxation, massage, acupressure, TENS, heat, and cold application Reviewed with patient prescribed pharmacological and nonpharmacological pain relief strategies Assessed social determinant of health barriers  Patient Goals/Self-Care Activities: Take all medications as prescribed Attend all scheduled provider appointments Call pharmacy for medication refills 3-7 days in advance of running out of medications Perform all self care activities independently  Perform IADL's (shopping, preparing meals, housekeeping, managing finances) independently Call provider office for new concerns or questions  Work with the social worker to address care coordination needs and will continue to work with the clinical team to address health care and disease management related needs  Follow Up Plan:  The patient has been provided with  contact information for the care management team and has been advised to call with any health related questions or concerns.  The care management team will reach out to the patient again over the next 30 days.

## 2021-11-13 NOTE — Patient Outreach (Signed)
Medicaid Managed Care   Nurse Care Manager Note  11/13/2021 Name:  [name] MRN:  [id number] DOB:  Mar 25, 1971  [name] is an 51 y.o. year old female who is a primary patient of Ladell Pier, MD.  The [facility] Managed Care Coordination team was consulted for assistance with:    Chronic healthcare management needs, back pain, lymphedema, asthma, HTN, CHF, CAD, OSA  [name] was given information about Medicaid Managed Care Coordination team services today. [place] Patient agreed to services and verbal consent obtained.  Engaged with patient by telephone for follow up visit in response to provider referral for case management and/or care coordination services.   Assessments/Interventions:  Review of past medical history, allergies, medications, health status, including review of consultants reports, laboratory and other test data, was performed as part of comprehensive evaluation and provision of chronic care management services.  SDOH (Social Determinants of Health) assessments and interventions performed:  Care Plan  Allergies  Allergen Reactions   Ace Inhibitors Rash and Other (See Comments)    Make pt bleed   Aspirin Other (See Comments)    Per patient paperwork: blood clot?  Likely because of chronic DOAC   Hydromorphone Hives and Itching   Vancomycin Itching and Rash   Contrast Media [Iodinated Contrast Media] Hives   Dilaudid [Hydromorphone Hcl] Hives   Lidocaine Itching    Itching with Lidocaine patch reported 06/13/2021 vis telephone message.   Medications Reviewed Today     Reviewed by [name], RN (Registered Nurse) on 11/13/21 at 1531  Med List Status: <None>   Medication Order Taking? Sig Documenting Provider Last Dose Status Informant  Accu-Chek Softclix Lancets lancets [id number] No Use as instructed  Patient taking differently: 1 each by Other route See admin instructions. Use as instructed   Argentina Donovan, PA-C Taking Active Self  acetaminophen (TYLENOL) 500 MG tablet [id number] No Take 1 tablet (500 mg total) by mouth every 6 (six) hours as needed.  Patient taking differently: Take 500 mg by mouth every 6 (six) hours as needed for mild pain.   Ladell Pier, MD Taking Active Self  acetaminophen (TYLENOL) 650 MG CR tablet [id number] No  [provider] Taking Active   albuterol (PROAIR HFA) 108 (90 Base) MCG/ACT inhaler [id number] No INHALE TWO PUFFS BY MOUTH EVERY 6 HOURS AS NEEDED FOR WHEEZING OR SHORTNESS OF BREATH  Patient taking differently: Inhale 2 puffs into the lungs See admin instructions. INHALE TWO PUFFS BY MOUTH EVERY 6 HOURS AS NEEDED FOR WHEEZING OR SHORTNESS OF Brennan Bailey, MD Taking Active Self  amLODipine (NORVASC) 5 MG tablet [id number] No Take 0.5 tablets (2.5 mg total) by mouth daily. Ladell Pier, MD Taking Active   atorvastatin (LIPITOR) 40 MG tablet [id number] No Take 1 tablet (40 mg total) by mouth daily. Ladell Pier, MD Taking Active   Blood Glucose Calibration (ACCU-CHEK GUIDE CONTROL) LIQD [id number] No 1 each by In Vitro route daily. Argentina Donovan, PA-C Taking Active Self  Blood Glucose Monitoring Suppl (ACCU-CHEK GUIDE) w/Device KIT [id number] No 1 each by Does not apply route 2 (two) times daily. Argentina Donovan, PA-C Taking Active Self  busPIRone (BUSPAR) 7.5 MG tablet [id number] No Take 1 tablet by mouth 2 (two) times daily. [provider] Taking Active   Carboxymethylcellulose Sodium (EYE DROPS OP) [id number] No Place 1 drop into both eyes daily as needed (dry eyes). [provider] Taking Active  Self  carvedilol (COREG) 25 MG tablet [id number] No TAKE ONE TABLET BY MOUTH TWICE A DAY WITH MEALS Ladell Pier, MD Taking Active   cholecalciferol (VITAMIN D3) 25 MCG (1000 UNIT) tablet [id number] No Take 1 tablet (1,000 Units total) by mouth daily. Ladell Pier, MD Taking Active Self  COVID-19 mRNA  Vac-TriS, Pfizer, (PFIZER-BIONT COVID-19 VAC-TRIS) SUSP injection [id number] No Inject into the muscle. Carlyle Basques, MD Taking Active   dapagliflozin propanediol (FARXIGA) 10 MG TABS tablet [id number]  Take 1 tablet (10 mg total) by mouth daily. Ladell Pier, MD  Active   diphenhydrAMINE (BENADRYL) 50 MG tablet [id number]  Take 1 tablet (50 mg total) by mouth once for 1 dose. Pt to take 50 mg of benadryl on 11/06/21 at 7:30 AM. Please call [phone] with any questions. Nelson Chimes, MD  Expired 10/31/21 2359   ELIQUIS 5 MG TABS tablet [id number] No TAKE ONE TABLET BY MOUTH TWICE A DAY Ladell Pier, MD Taking Active   FEROSUL 325 (65 Fe) MG tablet [id number] No TAKE ONE TABLET BY MOUTH DAILY WITH BREAKFAST Ladell Pier, MD Taking Active   furosemide (LASIX) 80 MG tablet [id number] No Take 1 tablet (80 mg total) by mouth daily. Take additional dose of lasix for weight gain, leg swelling Barb Merino, MD Taking Active   gabapentin (NEURONTIN) 100 MG capsule [id number] No Take 1 capsule (100 mg total) by mouth 3 (three) times daily. Ladell Pier, MD Taking Active   glucose blood test strip [id number] No Use as instructed  Patient taking differently: 1 each by Other route See admin instructions. Use as instructed   Argentina Donovan, PA-C Taking Active Self  hydrocortisone cream 1 % [id number] No Apply 1 application topically daily as needed for itching. [provider] Taking Active Self  hydrOXYzine (VISTARIL) 25 MG capsule [id number] No Take 1 capsule (25 mg total) by mouth at bedtime as needed.  Patient taking differently: Take 25 mg by mouth at bedtime as needed for anxiety or itching.   Ladell Pier, MD Taking Active Self  KLOR-CON M20 20 MEQ tablet [id number] No TAKE ONE TABLET BY MOUTH DAILY  Patient taking differently: Take 20 mEq by mouth daily.   Ladell Pier, MD Taking Active Self  methocarbamol (ROBAXIN) 500 MG tablet [id number] No Take 1 tablet (500  mg total) by mouth 2 (two) times daily as needed for muscle spasms. Ladell Pier, MD Taking Active   mometasone-formoterol [facility]) 200-5 MCG/ACT Hollie Salk [id number]  Inhale 2 puffs into the lungs in the morning and at bedtime. Ladell Pier, MD  Active   nitroGLYCERIN (NITROSTAT) 0.4 MG SL tablet [id number] No Place 1 tablet (0.4 mg total) under the tongue every 5 (five) minutes as needed for chest pain. Aline August, MD Taking Active Self  omeprazole (PRILOSEC) 20 MG capsule [id number] No Take 1 capsule (20 mg total) by mouth daily as needed (acid reflux). Fenton Foy, NP Taking Expired 09/18/21 2359 Self  PARoxetine (PAXIL) 10 MG tablet [id number] No Take 1 tablet (10 mg total) by mouth daily. Ladell Pier, MD Taking Active   predniSONE (DELTASONE) 50 MG tablet [id number]  Pt to take 50 mg of prednisone on 11/05/21 at 7:30 PM, 50 mg of prednisone on 11/06/21 at 1:30 AM, and 50 mg of prednisone on 11/06/21 at 7:30 AM. Pt is also to take 50 mg of benadryl on 11/06/21 at 7:30 AM. Please call [phone] with any questions. Nelson Chimes, MD  Active   spironolactone (ALDACTONE) 25 MG tablet [id number] No TAKE ONE TABLET BY MOUTH DAILY Ladell Pier, MD Taking Active   valsartan (DIOVAN) 160 MG tablet [id number] No Take 1 tablet (160 mg total) by mouth daily. Leonie Man, MD Taking Active Self  vitamin B-12 (CYANOCOBALAMIN) 1000 MCG tablet [id number] No Take 1,000 mcg by mouth daily. [provider] Taking Active Self           Patient Active Problem List   Diagnosis Date Noted   Pain in thoracic spine 09/25/2021   Low back pain 09/25/2021   OSA (obstructive sleep apnea) 07/11/2021   Depression 05/28/2021   Encounter for care related to Port-a-Cath 05/27/2021   Acute on chronic respiratory failure with hypoxia ([place]) 05/26/2021   Morbid obesity with BMI of 50.0-59.9, adult ([place]) 05/26/2021   Acute respiratory failure ([place]) 11/23/2020   Acute respiratory failure with  hypoxia ([place]) 11/22/2020   Fever 11/22/2020   Bilateral lower extremity edema 11/22/2020   History of DVT (deep vein thrombosis) 11/22/2020   Asthma, chronic, moderate persistent, with acute exacerbation 11/22/2020   Lower limb ulcer, calf, left, limited to breakdown of skin ([place]) 06/09/2020   Lymphedema 06/09/2020   Iron deficiency anemia due to chronic blood loss 05/10/2020   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 ([place]) 04/13/2020   Venous insufficiency 04/13/2020   S/P insertion of IVC (inferior vena caval) filter 04/13/2020   Chronic anemia 04/13/2020   Essential hypertension 04/13/2020   (HFpEF) heart failure with preserved ejection fraction (HCC) -> although echo suggests normal diastolic parameters with normal left atrial size 04/13/2020   CAD (coronary artery disease) 11/17/2019   Conditions to be addressed/monitored per PCP order:  Chronic healthcare management needs, back pain, lymphedema, asthma, HTN, CHF, CAD, OSA  Care Plan : RN Care Manager Plan of Care  Updates made by [name], RN since 11/13/2021 12:00 AM     Problem: Chronic Disease Management and Care Coordination Needs   Priority: High     Long-Range Goal: Self-Management Plan Developed   Start Date: 07/02/2021  Expected End Date: 01/07/2022  Recent Progress: Not on track  Priority: High  Note:   Current Barriers:  Knowledge Deficits related to plan of care for management of back pain. lymphedema Care Coordination needs related to Limited support and back pain Chronic Disease Management support and education needs related to back pain. Lymphedema  11/13/21:  Patient managing well today.  BP elevated-164/93-to recheck-has f/u with PCP 7/27.  Patient received back injections with some relief.    RNCM Clinical Goal(s):  Patient will verbalize understanding of plan for management of back pain, lymphedema  as evidenced by patient report and attending PT sessions take all  medications exactly as prescribed and will call provider for medication related questions as evidenced by patient report attend all scheduled medical appointments as evidenced by patient report continue to work with RN Care Manager to address care management and care coordination needs related to  back pain, lymphedema as evidenced by adherence to CM Team Scheduled appointments work with social worker to address  anxiety.  Limited support and hisory of anxiety/depression related to the management of back pain  as evidenced by review of EMR and patient or social worker report through collaboration with Consulting civil engineer, provider, and care team.   Interventions: Inter-disciplinary care team collaboration (see longitudinal plan of care) Evaluation of current treatment plan related to  self management  and patient's adherence to plan as established by provider Collaborated with [facility] to schedule an appt for her Collaborated with SW for anxiety SW referral for anxiety 08/29/21: BSW completed telephone outreach with patient. She stated she was looking for more of an aide to assistance with her mother. Due to her having appointments and they have 2 dogs her mom is not always able to take both of them out. BSW provided patient with the telephone number for [employer] of [place]. BSW also informed patient she would need to speak with her moms PCP to have a prescription sent in to her insurance company for long term aide services. No other resources needed at this time.  09/18/21: BSW completed telephone outreach with patient. She stated her mom decided she would go through her PCP to start LTD services. Patient did state she is having a hard time getting refills for one of her medications but does not remember the name it. Patient states no other resources are needed at this time.   Pain Interventions:  (Status:  New goal.) Long Term Goal Pain assessment performed Medications  reviewed Reviewed provider established plan for pain management Discussed importance of adherence to all scheduled medical appointments Counseled on the importance of reporting any/all new or changed pain symptoms or management strategies to pain management provider Advised patient to report to care team affect of pain on daily activities Discussed use of relaxation techniques and/or diversional activities to assist with pain reduction (distraction, imagery, relaxation, massage, acupressure, TENS, heat, and cold application Reviewed with patient prescribed pharmacological and nonpharmacological pain relief strategies Assessed social determinant of health barriers  Patient Goals/Self-Care Activities: Take all medications as prescribed Attend all scheduled provider appointments Call pharmacy for medication refills 3-7 days in advance of running out of medications Perform all self care activities independently  Perform IADL's (shopping, preparing meals, housekeeping, managing finances) independently Call provider office for new concerns or questions  Work with the social worker to address care coordination needs and will continue to work with the clinical team to address health care and disease management related needs  Follow Up Plan:  The patient has been provided with contact information for the care management team and has been advised to call with any health related questions or concerns.  The care management team will reach out to the patient again over the next 30 days.    Long-Range Goal: Establish Plan of Care for Chronic Disease Management Needs   Start Date: 07/02/2021  Expected End Date: 10/02/2021  Priority: High  Note:   Timeframe:  Long-Range Goal Priority:  High Start Date:     07/02/21                        Expected End Date:     ongoing                  Follow Up Date 12/14/21  - schedule appointment for flu shot - schedule appointment for vaccines needed due to my age or  health - schedule recommended health tests (blood work, mammogram, colonoscopy, pap test) - schedule and keep appointment for annual check-up    Why is this important?   Screening tests can find diseases early when they are easier to treat.  Your doctor or nurse will talk with you about which tests are important for you.  Getting shots for common diseases like the flu and shingles will help prevent them.  11/13/21:  Patient with upcoming PCP appt 7/27   Follow Up:  Patient agrees to Care Plan and Follow-up.  Plan: The Managed Medicaid care management team will reach out to the patient again over the next 30 days. and The  Patient has been provided with contact information for the Managed Medicaid care management team and has been advised to call with any health related questions or concerns.  Date/time of next scheduled RN care management/care coordination outreach:  12/14/21 at 315.

## 2021-11-13 NOTE — Telephone Encounter (Signed)
[facility] called and spoke to [place], Merchant navy officer about the refill(s) [employer] requested. Advised it was sent on 11/07/21 1 refill(s). Will refuse this request.   Requested Prescriptions  Pending Prescriptions Disp Refills   [place] 200-5 MCG/ACT AERO [Pharmacy Med Name: DULERA 200 MCG-5 MCG INHALER] 13 g 1    Sig: INHALE TWO PUFFS BY MOUTH EVERY MORNING INHALE TWO PUFFS BY MOUTH EVERY NIGHT AT BEDTIME     Pulmonology:  [employer] Passed - 11/12/2021  6:22 AM      Passed - Valid encounter within last 12 months    Recent Outpatient Visits           1 month ago Medication refill   [place] [name] B, MD   6 months ago Chronic bilateral thoracic back pain   [facility], Kriste Basque, NP   8 months ago Iron deficiency anemia due to chronic blood loss   [place], Deborah B, MD   10 months ago Preoperative evaluation to rule out surgical contraindication   [place], MD   11 months ago Type 2 diabetes mellitus with hyperglycemia, unspecified whether long term insulin use [facility])   [place], Dionne Bucy, [place]       Future Appointments             In 1 week Ladell Pier, MD [place]   In 2 months Ellyn Hack, Leonie Green, MD [facility], [facility]

## 2021-11-16 ENCOUNTER — Telehealth: Payer: Medicaid Other | Admitting: Internal Medicine

## 2021-11-22 ENCOUNTER — Ambulatory Visit: Payer: Medicaid Other | Attending: Internal Medicine | Admitting: Internal Medicine

## 2021-11-22 ENCOUNTER — Encounter: Payer: Self-pay | Admitting: Internal Medicine

## 2021-11-22 DIAGNOSIS — I5032 Chronic diastolic (congestive) heart failure: Secondary | ICD-10-CM

## 2021-11-22 DIAGNOSIS — Z23 Encounter for immunization: Secondary | ICD-10-CM | POA: Diagnosis not present

## 2021-11-22 DIAGNOSIS — J4540 Moderate persistent asthma, uncomplicated: Secondary | ICD-10-CM | POA: Diagnosis not present

## 2021-11-22 DIAGNOSIS — I2510 Atherosclerotic heart disease of native coronary artery without angina pectoris: Secondary | ICD-10-CM

## 2021-11-22 DIAGNOSIS — I152 Hypertension secondary to endocrine disorders: Secondary | ICD-10-CM

## 2021-11-22 DIAGNOSIS — D500 Iron deficiency anemia secondary to blood loss (chronic): Secondary | ICD-10-CM | POA: Diagnosis not present

## 2021-11-22 DIAGNOSIS — Z86718 Personal history of other venous thrombosis and embolism: Secondary | ICD-10-CM | POA: Diagnosis not present

## 2021-11-22 DIAGNOSIS — F321 Major depressive disorder, single episode, moderate: Secondary | ICD-10-CM

## 2021-11-22 DIAGNOSIS — E1159 Type 2 diabetes mellitus with other circulatory complications: Secondary | ICD-10-CM | POA: Diagnosis not present

## 2021-11-22 DIAGNOSIS — E1169 Type 2 diabetes mellitus with other specified complication: Secondary | ICD-10-CM | POA: Diagnosis not present

## 2021-11-22 LAB — GLUCOSE, POCT (MANUAL RESULT ENTRY): POC Glucose: 113 mg/dl — AB (ref 70–99)

## 2021-11-22 LAB — POCT GLYCOSYLATED HEMOGLOBIN (HGB A1C): HbA1c, POC (controlled diabetic range): 6.4 % (ref 0.0–7.0)

## 2021-11-22 MED ORDER — OZEMPIC (0.25 OR 0.5 MG/DOSE) 2 MG/3ML ~~LOC~~ SOPN: 0.2500 mg | PEN_INJECTOR | SUBCUTANEOUS | 0 refills | Status: DC

## 2021-11-22 MED ORDER — AMLODIPINE BESYLATE 5 MG PO TABS: 2.5000 mg | ORAL_TABLET | Freq: Every day | ORAL | 0 refills | Status: DC

## 2021-11-22 MED ORDER — POTASSIUM CHLORIDE CRYS ER 20 MEQ PO TBCR: 20.0000 meq | EXTENDED_RELEASE_TABLET | Freq: Every day | ORAL | 4 refills | Status: DC

## 2021-11-22 MED ORDER — ATORVASTATIN CALCIUM 40 MG PO TABS
40.0000 mg | ORAL_TABLET | Freq: Every day | ORAL | 1 refills | Status: DC
Start: 2021-11-22 — End: 2023-03-06

## 2021-11-22 MED ORDER — APIXABAN 5 MG PO TABS
5.0000 mg | ORAL_TABLET | Freq: Two times a day (BID) | ORAL | 1 refills | Status: DC
Start: 2021-11-22 — End: 2022-07-02

## 2021-11-22 MED ORDER — PAROXETINE HCL 20 MG PO TABS: 20.0000 mg | ORAL_TABLET | Freq: Every day | ORAL | 3 refills | Status: DC

## 2021-11-22 MED ORDER — NITROGLYCERIN 0.4 MG SL SUBL: 0.4000 mg | SUBLINGUAL_TABLET | SUBLINGUAL | 0 refills | Status: DC | PRN

## 2021-11-22 NOTE — Patient Instructions (Addendum)
Start Ozempic taking 0.25 mg once a week as instructed.  Please let us know if you develop any vomiting or pain in the upper abdomen.  Try to increase your exercise to 3 times a week.  We increase the paroxetine also known as Paxil from 10 mg daily to 20 mg daily to help better control your depression.

## 2021-11-22 NOTE — Progress Notes (Signed)
Patient ID: [name], female    DOB: Jun 10, 1970  MRN: [id number]  CC: Chronic disease management  Subjective: [name] is a 51 y.o. female who presents for chronic disease management Her concerns today include:  Patient with history of CAD, CHF (EF 55-60% on echo 11/2019) HTN, DM, DVT on lifelong anticoagulation and IVC filter present since 2016, asthma, anxiety, iron deficiency anemia with history of gastric ulcer and admission 11/2019 by [facility] for anemia requiring transfusion.  EGD and colonoscopy negative, chronic ulcer left lower extremity,   Patient has medications with her for medication reconciliation.  CAD/CHF/HTN: Denies any chest pains or shortness of breath at this time.  No recent use of sublingual nitroglycerin.  She tries to limit salt in the foods. Reports compliance with her medications that include Norvasc 5 mg half a tablet daily, atorvastatin 40 mg daily, carvedilol 25 mg twice a day, furosemide 40 mg daily, spironolactone 25 mg daily, Diovan 160 mg daily.  Obesity/DM:  Results for orders placed or performed in visit on 11/22/21  CBC  Result Value Ref Range   WBC 6.1 3.4 - 10.8 x10E3/uL   RBC 4.24 3.77 - 5.28 x10E6/uL   Hemoglobin 11.6 11.1 - 15.9 g/dL   Hematocrit 37.3 34.0 - 46.6 %   MCV 88 79 - 97 fL   MCH 27.4 26.6 - 33.0 pg   MCHC 31.1 (L) 31.5 - 35.7 g/dL   RDW 14.0 11.7 - 15.4 %   Platelets 141 (L) 150 - 450 x10E3/uL  Lipid panel  Result Value Ref Range   Cholesterol, Total 174 100 - 199 mg/dL   Triglycerides 99 0 - 149 mg/dL   HDL 52 >39 mg/dL   VLDL Cholesterol Cal 18 5 - 40 mg/dL   LDL Chol Calc (NIH) 104 (H) 0 - 99 mg/dL   Chol/HDL Ratio 3.3 0.0 - 4.4 ratio  POCT glucose (manual entry)  Result Value Ref Range   POC Glucose 113 (A) 70 - 99 mg/dl  POCT glycosylated hemoglobin (Hb A1C)  Result Value Ref Range   Hemoglobin A1C     HbA1c POC (<> result, manual entry)     HbA1c, POC (prediabetic range)     HbA1c,  POC (controlled diabetic range) 6.4 0.0 - 7.0 %     wanting to get her wgh down.  Saw nutritionist 10/04/2021 and found it helpful and has follow-up appointment next month.  Down 9 lbs since then.  Not drinking sodas as much, stopped eating fried foods and bread.  Snacking on chips and ice cream.  "I need to put more fruits and vegetables in my mouth instead of chips." -walks dogs daily.  Goes to gym 2 x a wk and walk the TM, ride bike or do pool exercise. Wants to try Ozemepic or Mounjaro  Hx of DVT/IDA:  No bruising or bleeding on Eliquis Followed by [name].  Last seen 08/2021.  Had iron infusion.  Hb has stayed in 11's range.  Reports that post her last infusion she developed diarrhea and headache that lasted several days.  Asthma:  doing okay with Dulera but wants to know whether she can get on Incruse as well. Stopped smoking 2007  MDD: She is on Paxil 10 mg daily.  She had some recent deaths in her family and feels that her depression has increased some.  PHQ-9 significantly positive today.  She had answered positive on the screening test for SI but verbally tells me that  is not the case and she missed read the question.  She feels she would benefit from a higher dose of the Paxil but is not interested in any counseling.  HM: Due for Shingrix vaccine.  She is agreeable to starting the vaccine series. Patient Active Problem List   Diagnosis Date Noted   Pain in thoracic spine 09/25/2021   Low back pain 09/25/2021   OSA (obstructive sleep apnea) 07/11/2021   Depression 05/28/2021   Encounter for care related to Port-a-Cath 05/27/2021   Acute on chronic respiratory failure with hypoxia ([place]) 05/26/2021   Morbid obesity with BMI of 50.0-59.9, adult ([place]) 05/26/2021   Acute respiratory failure ([place]) 11/23/2020   Acute respiratory failure with hypoxia ([place]) 11/22/2020   Fever 11/22/2020   Bilateral lower extremity edema 11/22/2020   History of DVT (deep vein thrombosis) 11/22/2020    Asthma, chronic, moderate persistent, with acute exacerbation 11/22/2020   Lower limb ulcer, calf, left, limited to breakdown of skin ([place]) 06/09/2020   Lymphedema 06/09/2020   Iron deficiency anemia due to chronic blood loss 05/10/2020   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 ([place]) 04/13/2020   Venous insufficiency 04/13/2020   S/P insertion of IVC (inferior vena caval) filter 04/13/2020   Chronic anemia 04/13/2020   Essential hypertension 04/13/2020   (HFpEF) heart failure with preserved ejection fraction (HCC) -> although echo suggests normal diastolic parameters with normal left atrial size 04/13/2020   CAD (coronary artery disease) 11/17/2019     Current Outpatient Medications on File Prior to Visit  Medication Sig Dispense Refill   Accu-Chek Softclix Lancets lancets Use as instructed (Patient taking differently: 1 each by Other route See admin instructions. Use as instructed) 100 each 12   acetaminophen (TYLENOL) 500 MG tablet Take 1 tablet (500 mg total) by mouth every 6 (six) hours as needed. (Patient taking differently: Take 500 mg by mouth every 6 (six) hours as needed for mild pain.) 60 tablet 0   acetaminophen (TYLENOL) 650 MG CR tablet      albuterol (PROAIR HFA) 108 (90 Base) MCG/ACT inhaler INHALE TWO PUFFS BY MOUTH EVERY 6 HOURS AS NEEDED FOR WHEEZING OR SHORTNESS OF BREATH (Patient taking differently: Inhale 2 puffs into the lungs See admin instructions. INHALE TWO PUFFS BY MOUTH EVERY 6 HOURS AS NEEDED FOR WHEEZING OR SHORTNESS OF BREATH) 8.5 g 0   Blood Glucose Calibration (ACCU-CHEK GUIDE CONTROL) LIQD 1 each by In Vitro route daily. 1 each 4   Blood Glucose Monitoring Suppl (ACCU-CHEK GUIDE) w/Device KIT 1 each by Does not apply route 2 (two) times daily. 1 kit 0   busPIRone (BUSPAR) 7.5 MG tablet Take 1 tablet by mouth 2 (two) times daily.     Carboxymethylcellulose Sodium (EYE DROPS OP) Place 1 drop into both eyes daily as needed  (dry eyes).     carvedilol (COREG) 25 MG tablet TAKE ONE TABLET BY MOUTH TWICE A DAY WITH MEALS 60 tablet 1   cholecalciferol (VITAMIN D3) 25 MCG (1000 UNIT) tablet Take 1 tablet (1,000 Units total) by mouth daily. 30 tablet 5   COVID-19 mRNA Vac-TriS, Pfizer, (PFIZER-BIONT COVID-19 VAC-TRIS) SUSP injection Inject into the muscle. 0.3 mL 0   dapagliflozin propanediol (FARXIGA) 10 MG TABS tablet Take 1 tablet (10 mg total) by mouth daily. 90 tablet 0   FEROSUL 325 (65 Fe) MG tablet TAKE ONE TABLET BY MOUTH DAILY WITH BREAKFAST 100 tablet 0   furosemide (LASIX) 80 MG tablet Take  1 tablet (80 mg total) by mouth daily. Take additional dose of lasix for weight gain, leg swelling 30 tablet 4   gabapentin (NEURONTIN) 100 MG capsule Take 1 capsule (100 mg total) by mouth 3 (three) times daily. 90 capsule 2   glucose blood test strip Use as instructed (Patient taking differently: 1 each by Other route See admin instructions. Use as instructed) 100 each 12   hydrocortisone cream 1 % Apply 1 application topically daily as needed for itching.     hydrOXYzine (VISTARIL) 25 MG capsule Take 1 capsule (25 mg total) by mouth at bedtime as needed. (Patient taking differently: Take 25 mg by mouth at bedtime as needed for anxiety or itching.) 30 capsule 1   methocarbamol (ROBAXIN) 500 MG tablet Take 1 tablet (500 mg total) by mouth 2 (two) times daily as needed for muscle spasms. 60 tablet 1   mometasone-formoterol (DULERA) 200-5 MCG/ACT AERO Inhale 2 puffs into the lungs in the morning and at bedtime. 13 g 1   predniSONE (DELTASONE) 50 MG tablet Pt to take 50 mg of prednisone on 11/05/21 at 7:30 PM, 50 mg of prednisone on 11/06/21 at 1:30 AM, and 50 mg of prednisone on 11/06/21 at 7:30 AM. Pt is also to take 50 mg of benadryl on 11/06/21 at 7:30 AM. Please call [phone] with any questions. 3 tablet 0   spironolactone (ALDACTONE) 25 MG tablet TAKE ONE TABLET BY MOUTH DAILY 30 tablet 2   valsartan (DIOVAN) 160 MG tablet  Take 1 tablet (160 mg total) by mouth daily. 90 tablet 3   vitamin B-12 (CYANOCOBALAMIN) 1000 MCG tablet Take 1,000 mcg by mouth daily.     diphenhydrAMINE (BENADRYL) 50 MG tablet Take 1 tablet (50 mg total) by mouth once for 1 dose. Pt to take 50 mg of benadryl on 11/06/21 at 7:30 AM. Please call [phone] with any questions. 1 tablet 0   omeprazole (PRILOSEC) 20 MG capsule Take 1 capsule (20 mg total) by mouth daily as needed (acid reflux). 30 capsule 0   No current facility-administered medications on file prior to visit.    Allergies  Allergen Reactions   Ace Inhibitors Rash and Other (See Comments)    Make pt bleed   Aspirin Other (See Comments)    Per patient paperwork: blood clot?  Likely because of chronic DOAC   Hydromorphone Hives and Itching   Vancomycin Itching and Rash   Contrast Media [Iodinated Contrast Media] Hives   Dilaudid [Hydromorphone Hcl] Hives   Lidocaine Itching    Itching with Lidocaine patch reported 06/13/2021 vis telephone message.    Social History   Socioeconomic History   Marital status: Single    Spouse name: Not on file   Number of children: 0   Years of education: Not on file   Highest education level: 12th grade  Occupational History   Occupation: unemployed on disablity  Tobacco Use   Smoking status: Former   Smokeless tobacco: Never  Scientific laboratory technician Use: Never used  Substance and Sexual Activity   Alcohol use: Not Currently   Drug use: Not Currently   Sexual activity: Not Currently  Other Topics Concern   Not on file  Social History Narrative   Not on file   Social Determinants of Health   Financial Resource Strain: Low Risk  (09/06/2021)   Overall Financial Resource Strain (CARDIA)    Difficulty of Paying Living Expenses: Not very hard  Food Insecurity: No Food Insecurity (10/10/2021)  Hunger Vital Sign    Worried About Running Out of Food in the Last Year: Never true    Ran Out of Food in the Last Year: Never true   Transportation Needs: No Transportation Needs (10/10/2021)   PRAPARE - Hydrologist (Medical): No    Lack of Transportation (Non-Medical): No  Physical Activity: Inactive (08/02/2021)   Exercise Vital Sign    Days of Exercise per Week: 0 days    Minutes of Exercise per Session: 0 min  Stress: Stress Concern Present (08/21/2021)   [place]    Feeling of Stress : To some extent  Social Connections: Socially Isolated (09/06/2021)   Social Connection and Isolation Panel [NHANES]    Frequency of Communication with Friends and Family: More than three times a week    Frequency of Social Gatherings with Friends and Family: More than three times a week    Attends Religious Services: Never    Marine scientist or Organizations: No    Attends Archivist Meetings: Never    Marital Status: Never married  Intimate Partner Violence: Not At Risk (11/13/2021)   Humiliation, Afraid, Rape, and Kick questionnaire    Fear of Current or Ex-Partner: No    Emotionally Abused: No    Physically Abused: No    Sexually Abused: No    Family History  Problem Relation Age of Onset   Diabetes Mellitus II Mother    COPD Father    Diabetes Father    Diabetes Mellitus II Maternal Grandmother    Breast cancer Paternal Grandfather     Past Surgical History:  Procedure Laterality Date   ABDOMINAL WALL DEFECT REPAIR  1970   IR CV LINE INJECTION  10/24/2020   IR REMOVAL TUN ACCESS W/ PORT W/O FL MOD SED  05/28/2021   IVC FILTER INSERTION  2017   Lower Extremity Venous Duplex  06/23/2020   No evidence of DVT or superficial thrombosis bilaterally.  No evidence of deep venous insufficiency bilaterally.  No evidence of SSV reflux.  Right GSV in the calf has reflux, no reflux in L GSV.;  Repeated in July 2020-no DVT   Regino Ramirez  2014   TRANSTHORACIC  ECHOCARDIOGRAM  11/25/2020   EF 55 to 60%.  Normal LV function.  No RWM A.  Mild LVH.  GR 1 DD.  Normal PAP.  Normal aortic and mitral valve.  Mildly elevated RAP  ~8 mmHg.    ROS: Review of Systems Negative except as stated above  PHYSICAL EXAM: BP 124/85   Pulse 100   Temp 98.4 F (36.9 C) (Oral)   Ht _0  (1.575 m)   Wt (!) 344 lb 3.2 oz (156.1 kg)   SpO2 97%   BMI 62.96 kg/m   Wt Readings from Last 3 Encounters:  11/22/21 (!) 344 lb 3.2 oz (156.1 kg)  10/17/21 (!) 353 lb (160.1 kg)  09/20/21 (!) 342 lb 8 oz (155.4 kg)    Physical Exam  General appearance - alert, well appearing, morbidly obese middle-age African-American female and in no distress Mental status - normal mood, behavior, speech, dress, motor activity, and thought processes Neck - supple, no significant adenopathy Chest - clear to auscultation, no wheezes, rales or rhonchi, symmetric air entry Heart - normal rate, regular rhythm, normal S1, S2, no murmurs, rubs, clicks or gallops Extremities - peripheral  pulses normal, no pedal edema, no clubbing or cyanosis     11/22/2021    2:10 PM 08/21/2021   11:05 AM 04/27/2021   11:17 AM  Depression screen PHQ 2/9  Decreased Interest 3 0 0  Down, Depressed, Hopeless 3 1 0  PHQ - 2 Score 6 1 0  Altered sleeping 1    Tired, decreased energy 1    Change in appetite 1    Feeling bad or failure about yourself  3    Trouble concentrating 3    Moving slowly or fidgety/restless 3    Suicidal thoughts 3    PHQ-9 Score 21        11/22/2021    2:11 PM 03/01/2021    3:41 PM 12/29/2020   10:30 AM 11/29/2020    2:25 PM  GAD 7 : Generalized Anxiety Score  Nervous, Anxious, on Edge 3 3 0 0  Control/stop worrying _0 0  Worry too much - different things _1 0  Trouble relaxing 1  2 0  Restless 3 0 0 0  Easily annoyed or irritable 3 0 0 0  Afraid - awful might happen 3 0  0  Total GAD 7 Score 15   0        Latest Ref Rng & Units 09/20/2021   11:44 AM 09/18/2021     3:39 PM 06/01/2021    4:03 AM  CMP  Glucose 70 - 99 mg/dL 136  82  168   BUN 6 - 20 mg/dL _2 Creatinine 0.44 - 1.00 mg/dL 0.86  0.93  0.97   Sodium 135 - 145 mmol/L 139  140  131   Potassium 3.5 - 5.1 mmol/L 4.1  4.0  3.6   Chloride 98 - 111 mmol/L 103  101  91   CO2 22 - 32 mmol/L 30  23  33   Calcium 8.9 - 10.3 mg/dL 9.8  9.4  9.2   Total Protein 6.5 - 8.1 g/dL 8.4     Total Bilirubin 0.3 - 1.2 mg/dL 0.6     Alkaline Phos 38 - 126 U/L 78     AST 15 - 41 U/L 12     ALT 0 - 44 U/L 8      Lipid Panel     Component Value Date/Time   CHOL 174 11/22/2021 1506   TRIG 99 11/22/2021 1506   HDL 52 11/22/2021 1506   CHOLHDL 3.3 11/22/2021 1506   LDLCALC 104 (H) 11/22/2021 1506    CBC    Component Value Date/Time   WBC 6.1 11/22/2021 1506   WBC 5.9 09/20/2021 1144   WBC 5.8 05/29/2021 0335   RBC 4.24 11/22/2021 1506   RBC 4.13 09/20/2021 1144   HGB 11.6 11/22/2021 1506   HCT 37.3 11/22/2021 1506   PLT 141 (L) 11/22/2021 1506   MCV 88 11/22/2021 1506   MCH 27.4 11/22/2021 1506   MCH 27.1 09/20/2021 1144   MCHC 31.1 (L) 11/22/2021 1506   MCHC 30.4 09/20/2021 1144   RDW 14.0 11/22/2021 1506   LYMPHSABS 0.9 09/20/2021 1144   LYMPHSABS 0.5 (L) 11/29/2020 1445   MONOABS 0.5 09/20/2021 1144   EOSABS 0.1 09/20/2021 1144   EOSABS 0.0 11/29/2020 1445   BASOSABS 0.0 09/20/2021 1144   BASOSABS 0.0 11/29/2020 1445    ASSESSMENT AND PLAN:  1. Type 2 diabetes mellitus with morbid obesity ([place]) At goal.  Discussed and encouraged healthy  eating habits as was discussed with the nutritionist.   Discussed trying her with Ozempic.  I went over with her how the medication works and benefits of diabetes control and weight loss.  Advised that if she develops any vomiting and pain in the upper abdomen, she should stop the medication and call as this could indicate pancreatitis.  No personal or family history of thyroid disease or medullary thyroid cancer.  Patient is wanting to try  the medication.  We will start her off on the lowest dose for 1 month and then gradually increase the dose. - POCT glucose (manual entry) - POCT glycosylated hemoglobin (Hb A1C) - Semaglutide,0.25 or 0.5MG/DOS, (OZEMPIC, 0.25 OR 0.5 MG/DOSE,) 2 MG/3ML SOPN; Inject 0.25 mg into the skin every 7 (seven) days.  Dispense: 3 mL; Refill: 0  2. Hypertension associated with diabetes ([place]) Close to goal.  Continue current medications and low-salt diet - amLODipine (NORVASC) 5 MG tablet; Take 0.5 tablets (2.5 mg total) by mouth daily.  Dispense: 45 tablet; Refill: 0 - potassium chloride SA (KLOR-CON M20) 20 MEQ tablet; Take 1 tablet (20 mEq total) by mouth daily.  Dispense: 30 tablet; Refill: 4  3. Coronary artery disease involving native coronary artery of native heart without angina pectoris Continue carvedilol and atorvastatin. - atorvastatin (LIPITOR) 40 MG tablet; Take 1 tablet (40 mg total) by mouth daily.  Dispense: 90 tablet; Refill: 1 - nitroGLYCERIN (NITROSTAT) 0.4 MG SL tablet; Place 1 tablet (0.4 mg total) under the tongue every 5 (five) minutes as needed for chest pain.  Dispense: 30 tablet; Refill: 0 - Lipid panel  4. Diastolic CHF, chronic (HCC) Compensated.  Continue furosemide and Farxiga  Discussed the importance of good blood pressure control.  5. History of DVT (deep vein thrombosis) Stable on Eliquis.  We will recheck CBC today. - apixaban (ELIQUIS) 5 MG TABS tablet; Take 1 tablet (5 mg total) by mouth 2 (two) times daily.  Dispense: 180 tablet; Refill: 1  6. Iron deficiency anemia due to chronic blood loss Continue iron supplement - CBC  7. Asthma, moderate persistent, well-controlled She reports that her asthma is under pretty good control on current inhaler Dulera.  I do not think we need to add Incruse at this time.  Patient was agreeable to this.  8. Moderate major depression ([place]) Patient feels her depression is little worse and would like increased dose of Paxil.  We  increased it to 20 mg daily.  She declines referral to behavioral health for counseling. - PARoxetine (PAXIL) 20 MG tablet; Take 1 tablet (20 mg total) by mouth daily.  Dispense: 30 tablet; Refill: 3  9. Need for shingles vaccine Agreeable to receiving the first Shingrix vaccine today.  Advised that the vaccine can cause some redness and swelling at the injection site that can last several days.    Patient was given the opportunity to ask questions.  Patient verbalized understanding of the plan and was able to repeat key elements of the plan.   This documentation was completed using Radio producer.  Any transcriptional errors are unintentional.  Orders Placed This Encounter  Procedures   Varicella-zoster vaccine IM   CBC   Lipid panel   POCT glucose (manual entry)   POCT glycosylated hemoglobin (Hb A1C)     Requested Prescriptions   Signed Prescriptions Disp Refills   apixaban (ELIQUIS) 5 MG TABS tablet 180 tablet 1    Sig: Take 1 tablet (5 mg total) by mouth 2 (two) times  daily.   atorvastatin (LIPITOR) 40 MG tablet 90 tablet 1    Sig: Take 1 tablet (40 mg total) by mouth daily.   amLODipine (NORVASC) 5 MG tablet 45 tablet 0    Sig: Take 0.5 tablets (2.5 mg total) by mouth daily.   potassium chloride SA (KLOR-CON M20) 20 MEQ tablet 30 tablet 4    Sig: Take 1 tablet (20 mEq total) by mouth daily.   nitroGLYCERIN (NITROSTAT) 0.4 MG SL tablet 30 tablet 0    Sig: Place 1 tablet (0.4 mg total) under the tongue every 5 (five) minutes as needed for chest pain.   Semaglutide,0.25 or 0.5MG/DOS, (OZEMPIC, 0.25 OR 0.5 MG/DOSE,) 2 MG/3ML SOPN 3 mL 0    Sig: Inject 0.25 mg into the skin every 7 (seven) days.   PARoxetine (PAXIL) 20 MG tablet 30 tablet 3    Sig: Take 1 tablet (20 mg total) by mouth daily.    Return in about 6 weeks (around 01/03/2022).  [name], MD, FACP

## 2021-11-22 NOTE — Progress Notes (Signed)
Pt requesting weight management. Pt requesting weight loss meds.

## 2021-11-23 ENCOUNTER — Other Ambulatory Visit: Payer: Self-pay

## 2021-11-23 ENCOUNTER — Telehealth: Payer: Self-pay

## 2021-11-23 LAB — CBC
Hematocrit: 37.3 % (ref 34.0–46.6)
Hemoglobin: 11.6 g/dL (ref 11.1–15.9)
MCH: 27.4 pg (ref 26.6–33.0)
MCHC: 31.1 g/dL — ABNORMAL LOW (ref 31.5–35.7)
MCV: 88 fL (ref 79–97)
Platelets: 141 10*3/uL — ABNORMAL LOW (ref 150–450)
RBC: 4.24 x10E6/uL (ref 3.77–5.28)
RDW: 14 % (ref 11.7–15.4)
WBC: 6.1 10*3/uL (ref 3.4–10.8)

## 2021-11-23 LAB — LIPID PANEL
Chol/HDL Ratio: 3.3 ratio (ref 0.0–4.4)
Cholesterol, Total: 174 mg/dL (ref 100–199)
HDL: 52 mg/dL (ref >39)
LDL Chol Calc (NIH): 104 mg/dL — ABNORMAL HIGH (ref 0–99)
Triglycerides: 99 mg/dL (ref 0–149)
VLDL Cholesterol Cal: 18 mg/dL (ref 5–40)

## 2021-11-23 NOTE — Telephone Encounter (Signed)
Ozempic prior auth approved until 11/23/2022.  Per Krist[facility] pharmacist, rx is still not processing.  Pharmacy will call ins for further information.

## 2021-12-05 ENCOUNTER — Encounter (INDEPENDENT_AMBULATORY_CARE_PROVIDER_SITE_OTHER): Payer: Self-pay

## 2021-12-12 ENCOUNTER — Other Ambulatory Visit: Payer: Self-pay | Admitting: Internal Medicine

## 2021-12-12 DIAGNOSIS — E1169 Type 2 diabetes mellitus with other specified complication: Secondary | ICD-10-CM

## 2021-12-12 NOTE — Telephone Encounter (Unsigned)
Copied from [place] [id number]. Topic: General - Other >> Dec 12, 2021  4:10 PM Everette C wrote: Reason for CRM: Medication Refill - Medication: Semaglutide,0.25 or 0.5MG /DOS, (OZEMPIC, 0.25 OR 0.5 MG/DOSE,) 2 MG/3ML SOPN [[id number]]   Pen Needles   Has the patient contacted their pharmacy? Yes.   (Agent: If no, request that the patient contact the pharmacy for the refill. If patient does not wish to contact the pharmacy document the reason why and proceed with request.) (Agent: If yes, when and what did the pharmacy advise?)  Preferred Pharmacy (with phone number or street name): [name] [id number] - [address], [place] [address] Phone: [phone] Fax: [phone] Hours: Not open 24 hours   Has the patient been seen for an appointment in the last year OR does the patient have an upcoming appointment? Yes.    Agent: Please be advised that RX refills may take up to 3 business days. We ask that you follow-up with your pharmacy.

## 2021-12-13 MED ORDER — OZEMPIC (0.25 OR 0.5 MG/DOSE) 2 MG/3ML ~~LOC~~ SOPN: 0.2500 mg | PEN_INJECTOR | SUBCUTANEOUS | 0 refills | Status: DC

## 2021-12-13 NOTE — Telephone Encounter (Signed)
Requested Prescriptions  Pending Prescriptions Disp Refills  . Semaglutide,0.25 or 0.5MG /DOS, (OZEMPIC, 0.25 OR 0.5 MG/DOSE,) 2 MG/3ML SOPN 3 mL 0    Sig: Inject 0.25 mg into the skin every 7 (seven) days.     Endocrinology:  Diabetes - GLP-1 Receptor Agonists - semaglutide Passed - 12/13/2021  9:12 AM      Passed - HBA1C in normal range and within 180 days    HbA1c, POC (prediabetic range)  Date Value Ref Range Status  04/07/2020 5.7 5.7 - 6.4 % Final   HbA1c, POC (controlled diabetic range)  Date Value Ref Range Status  11/22/2021 6.4 0.0 - 7.0 % Final         Passed - Cr in normal range and within 360 days    Creatinine  Date Value Ref Range Status  09/20/2021 0.86 0.44 - 1.00 mg/dL Final         Passed - Valid encounter within last 6 months    Recent Outpatient Visits          3 weeks ago Type 2 diabetes mellitus with morbid obesity ([place])   [place] Ladell Pier, MD   2 months ago Medication refill   [place], MD   7 months ago Chronic bilateral thoracic back pain   [facility], Kriste Basque, NP   9 months ago Iron deficiency anemia due to chronic blood loss   [place], MD   11 months ago Preoperative evaluation to rule out surgical contraindication   [place], MD      Future Appointments            In 3 weeks Ladell Pier, MD [place]   In 1 month Ellyn Hack, Leonie Green, MD [facility] [place], [facility]

## 2021-12-14 ENCOUNTER — Other Ambulatory Visit: Payer: Self-pay | Admitting: Obstetrics and Gynecology

## 2021-12-14 NOTE — Patient Outreach (Signed)
Care Coordination  12/14/2021  [name] 1970/08/01 [id number]  RNCM called patient, patient answered and asked to call me right back-no return call so appointment rescheduled.    [name], BSN [place]  [facility] [employer] - Managed Medicaid High Risk [id number].

## 2021-12-18 ENCOUNTER — Telehealth: Payer: Self-pay | Admitting: Hematology

## 2021-12-18 NOTE — Telephone Encounter (Signed)
Per 8/22 phone line pt called to r/s port flush ... Date and time per pt request

## 2021-12-20 ENCOUNTER — Ambulatory Visit: Payer: Medicaid Other | Admitting: Dietician

## 2022-01-03 ENCOUNTER — Ambulatory Visit: Payer: Medicaid Other | Attending: Internal Medicine | Admitting: Internal Medicine

## 2022-01-03 ENCOUNTER — Encounter: Payer: Self-pay | Admitting: Internal Medicine

## 2022-01-03 VITALS — BP 111/75 | HR 82 | Temp 98.0°F | Ht 66.0 in | Wt 340.5 lb

## 2022-01-03 DIAGNOSIS — E1169 Type 2 diabetes mellitus with other specified complication: Secondary | ICD-10-CM

## 2022-01-03 DIAGNOSIS — R300 Dysuria: Secondary | ICD-10-CM

## 2022-01-03 DIAGNOSIS — F321 Major depressive disorder, single episode, moderate: Secondary | ICD-10-CM | POA: Diagnosis not present

## 2022-01-03 DIAGNOSIS — Z23 Encounter for immunization: Secondary | ICD-10-CM

## 2022-01-03 LAB — POCT URINALYSIS DIP (CLINITEK)
Bilirubin, UA: NEGATIVE
Blood, UA: NEGATIVE
Glucose, UA: >=1000 mg/dL — AB
Leukocytes, UA: NEGATIVE
Nitrite, UA: NEGATIVE
POC PROTEIN,UA: NEGATIVE
Spec Grav, UA: 1.015 (ref 1.010–1.025)
Urobilinogen, UA: 2 E.U./dL — AB
pH, UA: 7 (ref 5.0–8.0)

## 2022-01-03 MED ORDER — OZEMPIC (0.25 OR 0.5 MG/DOSE) 2 MG/3ML ~~LOC~~ SOPN: 0.5000 mg | PEN_INJECTOR | SUBCUTANEOUS | 1 refills | Status: DC

## 2022-01-03 NOTE — Progress Notes (Signed)
Patient ID: [name], female    DOB: 1970-07-03  MRN: [id number]  CC: UTI and f/u DM Subjective: [name] is a 51 y.o. female who presents for UTI symptoms and f/u DM Her concerns today include:  Patient with history of CAD, CHF (EF 55-60% on echo 11/2019) HTN, DM, DVT on lifelong anticoagulation and IVC filter present since 2016, asthma, anxiety, iron deficiency anemia with history of gastric ulcer and admission 11/2019 by [facility] for anemia requiring transfusion.  EGD and colonoscopy negative, chronic ulcer left lower extremity,   Pt thinks she has UTI. C/o burning with urination x 3 days. No fever or flank pain  DM:  started on Ozempic on last visit.  Tolerating it well. No N/V.  Feels it has dec her appetite some.  Walks around her apartment complex twice a day and goes to gym at her complex once a wk - walks on TM and rides stationary bike.  She is down 4 pounds since last visit. UA today with increase glucose in urine.  Reports checking BS TID.  Morning BS running 150s, evening BS before dinner 170s and bedtime  On last visit we increased Paxil to 20 mg daily to improve depression symptoms.  Reports she is doing better on the increase dose.  Patient Active Problem List   Diagnosis Date Noted   Pain in thoracic spine 09/25/2021   Low back pain 09/25/2021   OSA (obstructive sleep apnea) 07/11/2021   Depression 05/28/2021   Encounter for care related to Port-a-Cath 05/27/2021   Acute on chronic respiratory failure with hypoxia ([place]) 05/26/2021   Morbid obesity with BMI of 50.0-59.9, adult ([place]) 05/26/2021   Acute respiratory failure ([place]) 11/23/2020   Acute respiratory failure with hypoxia ([place]) 11/22/2020   Fever 11/22/2020   Bilateral lower extremity edema 11/22/2020   History of DVT (deep vein thrombosis) 11/22/2020   Asthma, chronic, moderate persistent, with acute exacerbation 11/22/2020   Lower limb ulcer, calf, left, limited to breakdown  of skin ([place]) 06/09/2020   Lymphedema 06/09/2020   Iron deficiency anemia due to chronic blood loss 05/10/2020   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 ([place]) 04/13/2020   Venous insufficiency 04/13/2020   S/P insertion of IVC (inferior vena caval) filter 04/13/2020   Chronic anemia 04/13/2020   Essential hypertension 04/13/2020   (HFpEF) heart failure with preserved ejection fraction (HCC) -> although echo suggests normal diastolic parameters with normal left atrial size 04/13/2020   CAD (coronary artery disease) 11/17/2019     Current Outpatient Medications on File Prior to Visit  Medication Sig Dispense Refill   Accu-Chek Softclix Lancets lancets Use as instructed (Patient taking differently: 1 each by Other route See admin instructions. Use as instructed) 100 each 12   acetaminophen (TYLENOL) 500 MG tablet Take 1 tablet (500 mg total) by mouth every 6 (six) hours as needed. (Patient taking differently: Take 500 mg by mouth every 6 (six) hours as needed for mild pain.) 60 tablet 0   acetaminophen (TYLENOL) 650 MG CR tablet      albuterol (PROAIR HFA) 108 (90 Base) MCG/ACT inhaler INHALE TWO PUFFS BY MOUTH EVERY 6 HOURS AS NEEDED FOR WHEEZING OR SHORTNESS OF BREATH (Patient taking differently: Inhale 2 puffs into the lungs See admin instructions. INHALE TWO PUFFS BY MOUTH EVERY 6 HOURS AS NEEDED FOR WHEEZING OR SHORTNESS OF BREATH) 8.5 g 0   amLODipine (NORVASC) 5 MG tablet Take 0.5 tablets (  2.5 mg total) by mouth daily. 45 tablet 0   apixaban (ELIQUIS) 5 MG TABS tablet Take 1 tablet (5 mg total) by mouth 2 (two) times daily. 180 tablet 1   atorvastatin (LIPITOR) 40 MG tablet Take 1 tablet (40 mg total) by mouth daily. 90 tablet 1   Blood Glucose Calibration (ACCU-CHEK GUIDE CONTROL) LIQD 1 each by In Vitro route daily. 1 each 4   Blood Glucose Monitoring Suppl (ACCU-CHEK GUIDE) w/Device KIT 1 each by Does not apply route 2 (two) times daily. 1 kit 0    busPIRone (BUSPAR) 7.5 MG tablet Take 1 tablet by mouth 2 (two) times daily.     Carboxymethylcellulose Sodium (EYE DROPS OP) Place 1 drop into both eyes daily as needed (dry eyes).     carvedilol (COREG) 25 MG tablet TAKE ONE TABLET BY MOUTH TWICE A DAY WITH MEALS 60 tablet 1   cholecalciferol (VITAMIN D3) 25 MCG (1000 UNIT) tablet Take 1 tablet (1,000 Units total) by mouth daily. 30 tablet 5   COVID-19 mRNA Vac-TriS, Pfizer, (PFIZER-BIONT COVID-19 VAC-TRIS) SUSP injection Inject into the muscle. 0.3 mL 0   dapagliflozin propanediol (FARXIGA) 10 MG TABS tablet Take 1 tablet (10 mg total) by mouth daily. 90 tablet 0   FEROSUL 325 (65 Fe) MG tablet TAKE ONE TABLET BY MOUTH DAILY WITH BREAKFAST 100 tablet 0   furosemide (LASIX) 80 MG tablet Take 1 tablet (80 mg total) by mouth daily. Take additional dose of lasix for weight gain, leg swelling 30 tablet 4   gabapentin (NEURONTIN) 100 MG capsule Take 1 capsule (100 mg total) by mouth 3 (three) times daily. 90 capsule 2   glucose blood test strip Use as instructed (Patient taking differently: 1 each by Other route See admin instructions. Use as instructed) 100 each 12   hydrocortisone cream 1 % Apply 1 application topically daily as needed for itching.     hydrOXYzine (VISTARIL) 25 MG capsule Take 1 capsule (25 mg total) by mouth at bedtime as needed. (Patient taking differently: Take 25 mg by mouth at bedtime as needed for anxiety or itching.) 30 capsule 1   methocarbamol (ROBAXIN) 500 MG tablet Take 1 tablet (500 mg total) by mouth 2 (two) times daily as needed for muscle spasms. 60 tablet 1   mometasone-formoterol (DULERA) 200-5 MCG/ACT AERO Inhale 2 puffs into the lungs in the morning and at bedtime. 13 g 1   nitroGLYCERIN (NITROSTAT) 0.4 MG SL tablet Place 1 tablet (0.4 mg total) under the tongue every 5 (five) minutes as needed for chest pain. 30 tablet 0   PARoxetine (PAXIL) 20 MG tablet Take 1 tablet (20 mg total) by mouth daily. 30 tablet 3    potassium chloride SA (KLOR-CON M20) 20 MEQ tablet Take 1 tablet (20 mEq total) by mouth daily. 30 tablet 4   predniSONE (DELTASONE) 50 MG tablet Pt to take 50 mg of prednisone on 11/05/21 at 7:30 PM, 50 mg of prednisone on 11/06/21 at 1:30 AM, and 50 mg of prednisone on 11/06/21 at 7:30 AM. Pt is also to take 50 mg of benadryl on 11/06/21 at 7:30 AM. Please call [phone] with any questions. 3 tablet 0   Semaglutide,0.25 or 0.5MG/DOS, (OZEMPIC, 0.25 OR 0.5 MG/DOSE,) 2 MG/3ML SOPN Inject 0.25 mg into the skin every 7 (seven) days. 3 mL 0   spironolactone (ALDACTONE) 25 MG tablet TAKE ONE TABLET BY MOUTH DAILY 30 tablet 2   valsartan (DIOVAN) 160 MG tablet Take 1 tablet (160 mg  total) by mouth daily. 90 tablet 3   vitamin B-12 (CYANOCOBALAMIN) 1000 MCG tablet Take 1,000 mcg by mouth daily.     diphenhydrAMINE (BENADRYL) 50 MG tablet Take 1 tablet (50 mg total) by mouth once for 1 dose. Pt to take 50 mg of benadryl on 11/06/21 at 7:30 AM. Please call [phone] with any questions. 1 tablet 0   omeprazole (PRILOSEC) 20 MG capsule Take 1 capsule (20 mg total) by mouth daily as needed (acid reflux). 30 capsule 0   No current facility-administered medications on file prior to visit.    Allergies  Allergen Reactions   Ace Inhibitors Rash and Other (See Comments)    Make pt bleed   Aspirin Other (See Comments)    Per patient paperwork: blood clot?  Likely because of chronic DOAC   Hydromorphone Hives and Itching   Vancomycin Itching and Rash   Contrast Media [Iodinated Contrast Media] Hives   Dilaudid [Hydromorphone Hcl] Hives   Lidocaine Itching    Itching with Lidocaine patch reported 06/13/2021 vis telephone message.    Social History   Socioeconomic History   Marital status: Single    Spouse name: Not on file   Number of children: 0   Years of education: Not on file   Highest education level: 12th grade  Occupational History   Occupation: unemployed on disablity  Tobacco Use   Smoking  status: Former   Smokeless tobacco: Never  Vaping Use   Vaping Use: Never used  Substance and Sexual Activity   Alcohol use: Not Currently   Drug use: Not Currently   Sexual activity: Not Currently  Other Topics Concern   Not on file  Social History Narrative   Not on file   Social Determinants of Health   Financial Resource Strain: Low Risk  (09/06/2021)   Overall Financial Resource Strain (CARDIA)    Difficulty of Paying Living Expenses: Not very hard  Food Insecurity: No Food Insecurity (10/10/2021)   Hunger Vital Sign    Worried About Running Out of Food in the Last Year: Never true    Ran Out of Food in the Last Year: Never true  Transportation Needs: No Transportation Needs (10/10/2021)   PRAPARE - Transportation    Lack of Transportation (Medical): No    Lack of Transportation (Non-Medical): No  Physical Activity: Sufficiently Active (12/14/2021)   Exercise Vital Sign    Days of Exercise per Week: 7 days    Minutes of Exercise per Session: 30 min  Stress: Stress Concern Present (08/21/2021)   [employer] - Occupational Stress Questionnaire    Feeling of Stress : To some extent  Social Connections: Socially Isolated (09/06/2021)   Social Connection and Isolation Panel [NHANES]    Frequency of Communication with Friends and Family: More than three times a week    Frequency of Social Gatherings with Friends and Family: More than three times a week    Attends Religious Services: Never    Active Member of Clubs or Organizations: No    Attends Club or Organization Meetings: Never    Marital Status: Never married  Intimate Partner Violence: Not At Risk (11/13/2021)   Humiliation, Afraid, Rape, and Kick questionnaire    Fear of Current or Ex-Partner: No    Emotionally Abused: No    Physically Abused: No    Sexually Abused: No    Family History  Problem Relation Age of Onset   Diabetes Mellitus II Mother    COPD   Father    Diabetes Father     Diabetes Mellitus II Maternal Grandmother    Breast cancer Paternal Grandfather     Past Surgical History:  Procedure Laterality Date   ABDOMINAL WALL DEFECT REPAIR  1970   IR CV LINE INJECTION  10/24/2020   IR REMOVAL TUN ACCESS W/ PORT W/O FL MOD SED  05/28/2021   IVC FILTER INSERTION  2017   Lower Extremity Venous Duplex  06/23/2020   No evidence of DVT or superficial thrombosis bilaterally.  No evidence of deep venous insufficiency bilaterally.  No evidence of SSV reflux.  Right GSV in the calf has reflux, no reflux in L GSV.;  Repeated in July 2020-no DVT   [place]  2014   TRANSTHORACIC ECHOCARDIOGRAM  11/25/2020   EF 55 to 60%.  Normal LV function.  No RWM A.  Mild LVH.  GR 1 DD.  Normal PAP.  Normal aortic and mitral valve.  Mildly elevated RAP  ~8 mmHg.    ROS: Review of Systems Negative except as stated above  PHYSICAL EXAM: BP 111/75   Pulse 82   Temp 98 F (36.7 C) (Oral)   Ht 5' 6" (1.676 m)   Wt (!) 340 lb 8 oz (154.4 kg)   SpO2 95%   BMI 54.96 kg/m   Wt Readings from Last 3 Encounters:  01/03/22 (!) 340 lb 8 oz (154.4 kg)  11/22/21 (!) 344 lb 3.2 oz (156.1 kg)  10/17/21 (!) 353 lb (160.1 kg)    Physical Exam  General appearance - alert, well appearing, morbidly obese middle-age African-American female and in no distress Mental status - normal mood, behavior, speech, dress, motor activity, and thought processes      Latest Ref Rng & Units 09/20/2021   11:44 AM 09/18/2021    3:39 PM 06/01/2021    4:03 AM  CMP  Glucose 70 - 99 mg/dL 136  82  168   BUN 6 - 20 mg/dL _0 Creatinine 0.44 - 1.00 mg/dL 0.86  0.93  0.97   Sodium 135 - 145 mmol/L 139  140  131   Potassium 3.5 - 5.1 mmol/L 4.1  4.0  3.6   Chloride 98 - 111 mmol/L 103  101  91   CO2 22 - 32 mmol/L 30  23  33   Calcium 8.9 - 10.3 mg/dL 9.8  9.4  9.2   Total Protein 6.5 - 8.1 g/dL 8.4     Total Bilirubin 0.3 - 1.2 mg/dL 0.6      Alkaline Phos 38 - 126 U/L 78     AST 15 - 41 U/L 12     ALT 0 - 44 U/L 8      Lipid Panel     Component Value Date/Time   CHOL 174 11/22/2021 1506   TRIG 99 11/22/2021 1506   HDL 52 11/22/2021 1506   CHOLHDL 3.3 11/22/2021 1506   LDLCALC 104 (H) 11/22/2021 1506    CBC    Component Value Date/Time   WBC 6.1 11/22/2021 1506   WBC 5.9 09/20/2021 1144   WBC 5.8 05/29/2021 0335   RBC 4.24 11/22/2021 1506   RBC 4.13 09/20/2021 1144   HGB 11.6 11/22/2021 1506   HCT 37.3 11/22/2021 1506   PLT 141 (L) 11/22/2021 1506   MCV 88 11/22/2021 1506   MCH 27.4 11/22/2021 1506   MCH 27.1 09/20/2021 1144  MCHC 31.1 (L) 11/22/2021 1506   MCHC 30.4 09/20/2021 1144   RDW 14.0 11/22/2021 1506   LYMPHSABS 0.9 09/20/2021 1144   LYMPHSABS 0.5 (L) 11/29/2020 1445   MONOABS 0.5 09/20/2021 1144   EOSABS 0.1 09/20/2021 1144   EOSABS 0.0 11/29/2020 1445   BASOSABS 0.0 09/20/2021 1144   BASOSABS 0.0 11/29/2020 1445   Results for orders placed or performed in visit on 01/03/22  POCT URINALYSIS DIP (CLINITEK)  Result Value Ref Range   Color, UA yellow yellow   Clarity, UA clear clear   Glucose, UA >=1,000 (A) negative mg/dL   Bilirubin, UA negative negative   Ketones, POC UA small (15) (A) negative mg/dL   Spec Grav, UA 1.015 1.010 - 1.025   Blood, UA negative negative   pH, UA 7.0 5.0 - 8.0   POC PROTEIN,UA negative negative, trace   Urobilinogen, UA 2.0 (A) 0.2 or 1.0 E.U./dL   Nitrite, UA Negative Negative   Leukocytes, UA Negative Negative    ASSESSMENT AND PLAN:  1. Dysuria Urine dip not consistent with UTI. - POCT URINALYSIS DIP (CLINITEK)  2. Type 2 diabetes mellitus with morbid obesity ([place]) Commended her on wgh loss so far Encouraged to continue regular exercise.  Encouraged healthy eating Increase Ozempic to 0.5 mg Q wk We will check BMP today given that she has increased glucose in the urine and ketones. - Semaglutide,0.25 or 0.5MG/DOS, (OZEMPIC, 0.25 OR 0.5 MG/DOSE,)  2 MG/3ML SOPN; Inject 0.5 mg into the skin every 7 (seven) days.  Dispense: 3 mL; Refill: 1  3. Moderate major depression (HCC) Doing better on increase dose of paxil.   Will continue current dose of 20 mg  4. Need for influenza vaccination Given today    Patient was given the opportunity to ask questions.  Patient verbalized understanding of the plan and was able to repeat key elements of the plan.   This documentation was completed using Radio producer.  Any transcriptional errors are unintentional.  No orders of the defined types were placed in this encounter.    Requested Prescriptions    No prescriptions requested or ordered in this encounter    No follow-ups on file.  [name], MD, FACP

## 2022-01-03 NOTE — Progress Notes (Signed)
Pt wants to discuss sx's of UTI.  Pt states she has burning while urinating  Pt state that she experienced sx's for 3day.

## 2022-01-03 NOTE — Patient Instructions (Addendum)
After you have been on the Ozempic 0.5 mg once a week for 1 month, let me know via MyChart if you are tolerating it and ready to increase the dose to the 1 mg.  Please check blood sugars before breakfast and dinner.  Write down your numbers and bring them with you when you come to see our clinical pharmacist in 2 weeks.

## 2022-01-04 ENCOUNTER — Other Ambulatory Visit: Payer: Self-pay | Admitting: Internal Medicine

## 2022-01-04 DIAGNOSIS — I10 Essential (primary) hypertension: Secondary | ICD-10-CM

## 2022-01-04 DIAGNOSIS — F321 Major depressive disorder, single episode, moderate: Secondary | ICD-10-CM

## 2022-01-04 DIAGNOSIS — I5032 Chronic diastolic (congestive) heart failure: Secondary | ICD-10-CM

## 2022-01-04 DIAGNOSIS — I152 Hypertension secondary to endocrine disorders: Secondary | ICD-10-CM

## 2022-01-04 LAB — URINALYSIS, ROUTINE W REFLEX MICROSCOPIC
Bilirubin, UA: NEGATIVE
Leukocytes,UA: NEGATIVE
Nitrite, UA: NEGATIVE
Protein,UA: NEGATIVE
RBC, UA: NEGATIVE
Specific Gravity, UA: 1.022 (ref 1.005–1.030)
Urobilinogen, Ur: 1 mg/dL (ref 0.2–1.0)
pH, UA: 7.5 (ref 5.0–7.5)

## 2022-01-04 LAB — BASIC METABOLIC PANEL
BUN/Creatinine Ratio: 6 — ABNORMAL LOW (ref 9–23)
BUN: 5 mg/dL — ABNORMAL LOW (ref 6–24)
CO2: 27 mmol/L (ref 20–29)
Calcium: 9.8 mg/dL (ref 8.7–10.2)
Chloride: 102 mmol/L (ref 96–106)
Creatinine, Ser: 0.81 mg/dL (ref 0.57–1.00)
Glucose: 82 mg/dL (ref 70–99)
Potassium: 3.9 mmol/L (ref 3.5–5.2)
Sodium: 144 mmol/L (ref 134–144)
eGFR: 87 mL/min/{1.73_m2} (ref >59)

## 2022-01-04 NOTE — Telephone Encounter (Signed)
Medication Refill - Medication: carvedilol (COREG) 25 MG tablet [[id number]]   gabapentin (NEURONTIN) 100 MG capsule [[id number]]   spironolactone (ALDACTONE) 25 MG tablet [[id number]]   methocarbamol (ROBAXIN) 500 MG tablet [[id number]]   PARoxetine (PAXIL) 20 MG tablet [[id number]]   amLODipine (NORVASC) 5 MG tablet [[id number]]     Has the patient contacted their pharmacy? No. (Agent: If no, request that the patient contact the pharmacy for the refill. If patient does not wish to contact the pharmacy document the reason why and proceed with request.) (Agent: If yes, when and what did the pharmacy advise?)  Preferred Pharmacy (with phone number or street name):  [name] [id number] - [address], [place] [address]  Phone: [phone] Fax: [phone]  Hours: Not open 24 hours   Has the patient been seen for an appointment in the last year OR does the patient have an upcoming appointment? Yes.  01/03/22  Agent: Please be advised that RX refills may take up to 3 business days. We ask that you follow-up with your pharmacy.

## 2022-01-07 MED ORDER — METHOCARBAMOL 500 MG PO TABS: 500.0000 mg | ORAL_TABLET | Freq: Two times a day (BID) | ORAL | 1 refills | Status: DC | PRN

## 2022-01-07 MED ORDER — SPIRONOLACTONE 25 MG PO TABS: 25.0000 mg | ORAL_TABLET | Freq: Every day | ORAL | 3 refills | Status: DC

## 2022-01-07 MED ORDER — GABAPENTIN 100 MG PO CAPS: 100.0000 mg | ORAL_CAPSULE | Freq: Three times a day (TID) | ORAL | 3 refills | Status: DC

## 2022-01-07 MED ORDER — CARVEDILOL 25 MG PO TABS: 25.0000 mg | ORAL_TABLET | Freq: Two times a day (BID) | ORAL | 3 refills | Status: DC

## 2022-01-07 NOTE — Telephone Encounter (Signed)
Requested Prescriptions  Pending Prescriptions Disp Refills  . amLODipine (NORVASC) 5 MG tablet 45 tablet 0    Sig: Take 0.5 tablets (2.5 mg total) by mouth daily.     Cardiovascular: Calcium Channel Blockers 2 Passed - 01/04/2022 11:40 AM      Passed - Last BP in normal range    BP Readings from Last 1 Encounters:  01/03/22 111/75         Passed - Last Heart Rate in normal range    Pulse Readings from Last 1 Encounters:  01/03/22 82         Passed - Valid encounter within last 6 months    Recent Outpatient Visits          4 days ago [place] Megan Plumber [name], [name]   1 month ago Type 2 diabetes mellitus with morbid obesity [facility])   [place] Megan Pier, [name]   2 months ago Medication refill   [place], [name]   8 months ago Chronic bilateral thoracic back pain   Primary Care at [facility], Megan Basque, [name]   10 months ago Iron deficiency anemia due to chronic blood loss   [place], [name]      Future Appointments            In 3 weeks Megan Man, [name] [place] A Dept Of [place]. [place]   In 4 weeks [name], [name], [name]   In 4 months [name], Megan Batman, [name] [place]           . carvedilol (COREG) 25 MG tablet 60 tablet 3    Sig: Take 1 tablet (25 mg total) by mouth 2 (two) times daily with a meal.     Cardiovascular: Beta Blockers 3 Failed - 01/04/2022 11:40 AM      Failed - AST in normal range and within 360 days    AST  Date Value Ref Range Status  09/20/2021 12 (L) 15 - 41 U/L Final         Passed - Cr in normal range and within 360 days    Creatinine  Date Value Ref Range Status  09/20/2021 0.86 0.44 - 1.00 mg/dL Final   Creatinine, Ser  Date Value Ref Range  Status  01/03/2022 0.81 0.57 - 1.00 mg/dL Final         Passed - ALT in normal range and within 360 days    ALT  Date Value Ref Range Status  09/20/2021 8 0 - 44 U/L Final         Passed - Last BP in normal range    BP Readings from Last 1 Encounters:  01/03/22 111/75         Passed - Last Heart Rate in normal range    Pulse Readings from Last 1 Encounters:  01/03/22 82         Passed - Valid encounter within last 6 months    Recent Outpatient Visits          4 days ago [place], [name]   1 month ago Type 2 diabetes mellitus with morbid obesity [facility])   [place]  [facility] Megan Pier, [name]   2 months ago Medication refill   [place], [name]   8 months ago Chronic bilateral thoracic back pain   Primary Care at [facility], Megan Basque, [name]   10 months ago Iron deficiency anemia due to chronic blood loss   [place], [name], [name]      Future Appointments            In 3 weeks Megan Man, [name] [place] A Dept Of [place]. [place]   In 4 weeks [name], [name], [place]   In 4 months [name], Megan Batman, [name] [place]           . gabapentin (NEURONTIN) 100 MG capsule 90 capsule 3    Sig: Take 1 capsule (100 mg total) by mouth 3 (three) times daily.     Neurology: Anticonvulsants - gabapentin Passed - 01/04/2022 11:40 AM      Passed - Cr in normal range and within 360 days    Creatinine  Date Value Ref Range Status  09/20/2021 0.86 0.44 - 1.00 mg/dL Final   Creatinine, Ser  Date Value Ref Range Status  01/03/2022 0.81 0.57 - 1.00 mg/dL Final         Passed - Completed PHQ-2 or PHQ-9 in the last 360 days      Passed - Valid encounter within last 12 months    Recent  Outpatient Visits          4 days ago [place] Megan Plumber [name], [name]   1 month ago Type 2 diabetes mellitus with morbid obesity [facility])   [place] Megan Pier, [name]   2 months ago Medication refill   [place], [name]   8 months ago Chronic bilateral thoracic back pain   Primary Care at [facility], Megan Basque, [name]   10 months ago Iron deficiency anemia due to chronic blood loss   [place], [name], [name]      Future Appointments            In 3 weeks Megan Man, [name] [place] A Dept Of [place]. [place]   In 4 weeks [name], [name], [place]   In 4 months [name], Megan Batman, [name] [place]           . methocarbamol (ROBAXIN) 500 MG tablet 60 tablet 1    Sig: Take 1 tablet (500 mg total) by mouth 2 (two) times daily as needed for muscle spasms.     Not Delegated - Analgesics:  Muscle Relaxants Failed - 01/04/2022 11:40 AM      Failed - This refill cannot be delegated      Passed - Valid encounter within last 6 months    Recent Outpatient Visits          4 days ago [place] Megan Plumber [name], [name]   1 month ago Type 2 diabetes mellitus with morbid obesity [facility])   [place] [facility] Megan Pier, [name]  2 months ago Medication refill   [place] Megan Pier, [name]   8 months ago Chronic bilateral thoracic back pain   Primary Care at [facility], Megan Basque, [name]   10 months ago Iron deficiency anemia due to chronic blood loss   [place], [name]      Future Appointments            In 3 weeks Megan Man, [name] [place] A Dept Of [place]. [place]   In 4 weeks [name], [name], [place]   In 4 months [name], Megan Batman, [name] [place]           . PARoxetine (PAXIL) 20 MG tablet 30 tablet 3    Sig: Take 1 tablet (20 mg total) by mouth daily.     Psychiatry:  Antidepressants - SSRI Passed - 01/04/2022 11:40 AM      Passed - Completed PHQ-2 or PHQ-9 in the last 360 days      Passed - Valid encounter within last 6 months    Recent Outpatient Visits          4 days ago [place] Megan Plumber [name], [name]   1 month ago Type 2 diabetes mellitus with morbid obesity [facility])   [place] Megan Pier, [name]   2 months ago Medication refill   [place], [name]   8 months ago Chronic bilateral thoracic back pain   [facility] at [facility], Megan Basque, [name]   10 months ago Iron deficiency anemia due to chronic blood loss   [place], [name], [name]      Future Appointments            In 3 weeks Megan Man, [name] [place] A Dept Of [place]. [place]   In 4 weeks [name], [name], [place]   In 4 months [name], Megan Batman, [name] [place]           . spironolactone (ALDACTONE) 25 MG tablet 30 tablet 3    Sig: Take 1 tablet (25 mg total) by mouth daily.     Cardiovascular: Diuretics - Aldosterone Antagonist Passed - 01/04/2022 11:40 AM      Passed - Cr in normal range and within 180 days    Creatinine  Date Value Ref Range Status  09/20/2021 0.86 0.44 - 1.00 mg/dL Final   Creatinine, Ser  Date Value Ref Range Status  01/03/2022 0.81 0.57 - 1.00 mg/dL Final         Passed - K in normal range and within 180 days    Potassium  Date  Value Ref Range Status  01/03/2022 3.9 3.5 - 5.2 mmol/L Final         Passed - Na in normal range and within 180 days    Sodium  Date Value Ref Range Status  01/03/2022 144 134 - 144 mmol/L Final         Passed - eGFR is 30 or above and within 180 days    GFR calc Af Amer  Date Value Ref Range Status  01/20/2020 >60 >60 mL/min Final   GFR, Estimated  Date Value Ref Range Status  09/20/2021 >60 >60 mL/min Final    Comment:    (NOTE) Calculated using the CKD-EPI Creatinine Equation (2021)    eGFR  Date Value Ref Range Status  01/03/2022 87 >59 mL/min/1.73 Final         Passed - Last BP in normal range    BP Readings from Last 1 Encounters:  01/03/22 111/75         Passed - Valid encounter within last 6 months    Recent Outpatient Visits          4 days ago [place] Megan Plumber [name], [name]   1 month ago Type 2 diabetes mellitus with morbid obesity [facility])   [place], [name]   2 months ago Medication refill   [place], [name]   8 months ago Chronic bilateral thoracic back pain   Primary Care at [facility], Megan Basque, [name]   10 months ago Iron deficiency anemia due to chronic blood loss   [place], [name]      Future Appointments            In 3 weeks Megan Man, [name] [place] A Dept Of [place]. [place]   In 4 weeks [name], [name], [place]   In 4 months [name], Megan Batman, [name] [place]

## 2022-01-07 NOTE — Telephone Encounter (Signed)
Requested medication (s) are due for refill today: yes  Requested medication (s) are on the active medication list: yes  Last refill:  10/12/21 #60 with 1 RF  Future visit scheduled: 05/07/22, seen 01/03/22  Notes to clinic:  This medication can not be delegated, please assess.        Requested Prescriptions  Pending Prescriptions Disp Refills   methocarbamol (ROBAXIN) 500 MG tablet 60 tablet 1    Sig: Take 1 tablet (500 mg total) by mouth 2 (two) times daily as needed for muscle spasms.     Not Delegated - Analgesics:  Muscle Relaxants Failed - 01/04/2022 11:40 AM      Failed - This refill cannot be delegated      Passed - Valid encounter within last 6 months    Recent Outpatient Visits           4 days ago [place] [name] B, MD   1 month ago Type 2 diabetes mellitus with morbid obesity [facility])   [place] Megan Pier, MD   2 months ago Medication refill   [place], MD   8 months ago Chronic bilateral thoracic back pain   Primary Care at [facility], Kriste Basque, NP   10 months ago Iron deficiency anemia due to chronic blood loss   [place], Deborah B, MD       Future Appointments             In 3 weeks Megan Man, MD [place] A Dept Of [place]. [place]   In 4 weeks [name], Jarome Matin, [place]   In 4 months [name], Dalbert Batman, MD [place]            Signed Prescriptions Disp Refills   carvedilol (COREG) 25 MG tablet 60 tablet 3    Sig: Take 1 tablet (25 mg total) by mouth 2 (two) times daily with a meal.     Cardiovascular: Beta Blockers 3 Failed - 01/04/2022 11:40 AM      Failed - AST in normal range and within 360 days    AST  Date Value Ref Range Status   09/20/2021 12 (L) 15 - 41 U/L Final         Passed - Cr in normal range and within 360 days    Creatinine  Date Value Ref Range Status  09/20/2021 0.86 0.44 - 1.00 mg/dL Final   Creatinine, Ser  Date Value Ref Range Status  01/03/2022 0.81 0.57 - 1.00 mg/dL Final         Passed - ALT in normal range and within 360 days    ALT  Date Value Ref Range Status  09/20/2021 8 0 - 44 U/L Final         Passed - Last BP in normal range    BP Readings from Last 1 Encounters:  01/03/22 111/75         Passed - Last Heart Rate in normal range    Pulse Readings from Last 1 Encounters:  01/03/22 82         Passed - Valid encounter within last 6 months    Recent Outpatient Visits           4 days ago  [place] [name] B, MD   1 month ago Type 2 diabetes mellitus with morbid obesity [facility])   [place], MD   2 months ago Medication refill   [place], MD   8 months ago Chronic bilateral thoracic back pain   Primary Care at [facility], Kriste Basque, NP   10 months ago Iron deficiency anemia due to chronic blood loss   [place], Deborah B, MD       Future Appointments             In 3 weeks Megan Man, MD [place] A Dept Of [place]. [place]   In 4 weeks Tresa Endo, [place]   In 4 months Megan Pier, MD [place]             gabapentin (NEURONTIN) 100 MG capsule 90 capsule 3    Sig: Take 1 capsule (100 mg total) by mouth 3 (three) times daily.     Neurology: Anticonvulsants - gabapentin Passed - 01/04/2022 11:40 AM      Passed - Cr in normal range and within 360 days    Creatinine  Date Value Ref Range Status  09/20/2021 0.86 0.44 - 1.00 mg/dL  Final   Creatinine, Ser  Date Value Ref Range Status  01/03/2022 0.81 0.57 - 1.00 mg/dL Final         Passed - Completed PHQ-2 or PHQ-9 in the last 360 days      Passed - Valid encounter within last 12 months    Recent Outpatient Visits           4 days ago [place] [name] B, MD   1 month ago Type 2 diabetes mellitus with morbid obesity [facility])   [place] Megan Pier, MD   2 months ago Medication refill   [place], MD   8 months ago Chronic bilateral thoracic back pain   Primary Care at [facility], Kriste Basque, NP   10 months ago Iron deficiency anemia due to chronic blood loss   [place], Deborah B, MD       Future Appointments             In 3 weeks Megan Man, MD [place] A Dept Of [place]. [place]   In 4 weeks [name], Jarome Matin, [place]   In 4 months Megan Pier, MD [place]             spironolactone (ALDACTONE) 25 MG tablet 30 tablet 3    Sig: Take 1 tablet (25 mg total) by mouth daily.     Cardiovascular: Diuretics - Aldosterone Antagonist Passed - 01/04/2022 11:40 AM      Passed - Cr in normal range and within 180 days    Creatinine  Date Value Ref Range Status  09/20/2021 0.86 0.44 - 1.00 mg/dL Final   Creatinine, Ser  Date Value Ref Range Status  01/03/2022 0.81 0.57 - 1.00 mg/dL Final  Passed - K in normal range and within 180 days    Potassium  Date Value Ref Range Status  01/03/2022 3.9 3.5 - 5.2 mmol/L Final         Passed - Na in normal range and within 180 days    Sodium  Date Value Ref Range Status  01/03/2022 144 134 - 144 mmol/L Final         Passed - eGFR is 30 or above and within 180 days    GFR calc Af Amer   Date Value Ref Range Status  01/20/2020 >60 >60 mL/min Final   GFR, Estimated  Date Value Ref Range Status  09/20/2021 >60 >60 mL/min Final    Comment:    (NOTE) Calculated using the CKD-EPI Creatinine Equation (2021)    eGFR  Date Value Ref Range Status  01/03/2022 87 >59 mL/min/1.73 Final         Passed - Last BP in normal range    BP Readings from Last 1 Encounters:  01/03/22 111/75         Passed - Valid encounter within last 6 months    Recent Outpatient Visits           4 days ago [place], Deborah B, MD   1 month ago Type 2 diabetes mellitus with morbid obesity [facility])   [place], MD   2 months ago Medication refill   [place], MD   8 months ago Chronic bilateral thoracic back pain   Primary Care at [facility], Kriste Basque, NP   10 months ago Iron deficiency anemia due to chronic blood loss   [place], MD       Future Appointments             In 3 weeks Megan Man, MD [place] A Dept Of [place]   In 4 weeks Tresa Endo, [place]   In 4 months Megan Pier, MD [place]            Refused Prescriptions Disp Refills   amLODipine (NORVASC) 5 MG tablet 45 tablet 0    Sig: Take 0.5 tablets (2.5 mg total) by mouth daily.     Cardiovascular: Calcium Channel Blockers 2 Passed - 01/04/2022 11:40 AM      Passed - Last BP in normal range    BP Readings from Last 1 Encounters:  01/03/22 111/75         Passed - Last Heart Rate in normal range    Pulse Readings from Last 1 Encounters:  01/03/22 82         Passed - Valid encounter within last 6 months    Recent Outpatient Visits           4 days ago [place] [name] B, MD   1 month ago Type 2 diabetes mellitus with morbid obesity [facility])   [place] Megan Pier, MD   2 months ago Medication refill   [place] Megan Pier, MD   8 months ago Chronic bilateral thoracic back pain   Primary Care at [facility]  [place] Foy, NP   10 months ago Iron deficiency anemia due to chronic blood loss   [place], MD       Future Appointments             In 3 weeks [place] Megan Green, MD [place] A Dept Of [place]   In 4 weeks [name], Jarome Matin, [place]   In 4 months Megan Pier, MD [place]             PARoxetine (PAXIL) 20 MG tablet 30 tablet 3    Sig: Take 1 tablet (20 mg total) by mouth daily.     Psychiatry:  Antidepressants - SSRI Passed - 01/04/2022 11:40 AM      Passed - Completed PHQ-2 or PHQ-9 in the last 360 days      Passed - Valid encounter within last 6 months    Recent Outpatient Visits           4 days ago [place] [name] B, MD   1 month ago Type 2 diabetes mellitus with morbid obesity [facility])   [place] Megan Pier, MD   2 months ago Medication refill   [place], MD   8 months ago Chronic bilateral thoracic back pain   Primary Care at [facility], Kriste Basque, NP   10 months ago Iron deficiency anemia due to chronic blood loss   [place], Deborah B, MD       Future Appointments             In 3 weeks Megan Man, MD [place] A Dept Of . [place]   In 4 weeks [name], Jarome Matin,  [place]   In 4 months [name], Dalbert Batman, MD [place]

## 2022-01-08 ENCOUNTER — Inpatient Hospital Stay: Payer: Medicaid Other | Attending: Hematology

## 2022-01-08 DIAGNOSIS — D500 Iron deficiency anemia secondary to blood loss (chronic): Secondary | ICD-10-CM

## 2022-01-08 MED ORDER — HEPARIN SOD (PORK) LOCK FLUSH 100 UNIT/ML IV SOLN: 500.0000 [IU] | Freq: Once | INTRAVENOUS | Status: DC

## 2022-01-08 MED ORDER — SODIUM CHLORIDE 0.9% FLUSH: 10.0000 mL | Freq: Once | INTRAVENOUS | Status: DC

## 2022-01-17 ENCOUNTER — Other Ambulatory Visit: Payer: Self-pay | Admitting: Internal Medicine

## 2022-01-17 DIAGNOSIS — E1169 Type 2 diabetes mellitus with other specified complication: Secondary | ICD-10-CM

## 2022-01-24 ENCOUNTER — Other Ambulatory Visit: Payer: Self-pay | Admitting: Obstetrics and Gynecology

## 2022-01-24 NOTE — Patient Outreach (Signed)
Care Coordination  01/24/2022  [name] [birth year] [id number]   Medicaid Managed Care   Unsuccessful Outreach Note  01/24/2022 Name: [name] MRN: [id number] DOB: 1971-02-20  Referred by: Ladell Pier, MD Reason for referral : High Risk Managed Medicaid (Unsuccessful telephone outreach)   An unsuccessful telephone outreach was attempted today. The patient was referred to the case management team for assistance with care management and care coordination.   Follow Up Plan: The care management team will reach out to the patient again over the next 30 business  days.   [name], BSN [place]  [facility] - Managed Medicaid High Risk [phone].

## 2022-01-24 NOTE — Patient Instructions (Signed)
Hi [name], sorry to have missed you today-I hope you are doing okay- as a part of your Medicaid benefit, you are eligible for care management and care coordination services at no cost or copay. I was unable to reach you by phone today but would be happy to help you with your health related needs. Please feel free to call me at [phone].  A member of the Managed Medicaid care management team will reach out to you again over the next 30 business  days.   [name], BSN [place]  [facility] - Managed Medicaid High Risk [phone]

## 2022-01-29 ENCOUNTER — Ambulatory Visit: Payer: Medicaid Other | Attending: Cardiology | Admitting: Cardiology

## 2022-01-29 ENCOUNTER — Other Ambulatory Visit: Payer: Self-pay | Admitting: Internal Medicine

## 2022-01-29 ENCOUNTER — Encounter: Payer: Self-pay | Admitting: Cardiology

## 2022-01-29 VITALS — BP 132/89 | HR 97 | Ht 62.0 in | Wt 323.4 lb

## 2022-01-29 DIAGNOSIS — I82562 Chronic embolism and thrombosis of left calf muscular vein: Secondary | ICD-10-CM | POA: Diagnosis not present

## 2022-01-29 DIAGNOSIS — D649 Anemia, unspecified: Secondary | ICD-10-CM

## 2022-01-29 DIAGNOSIS — I890 Lymphedema, not elsewhere classified: Secondary | ICD-10-CM | POA: Diagnosis not present

## 2022-01-29 DIAGNOSIS — I5032 Chronic diastolic (congestive) heart failure: Secondary | ICD-10-CM | POA: Diagnosis not present

## 2022-01-29 DIAGNOSIS — E785 Hyperlipidemia, unspecified: Secondary | ICD-10-CM

## 2022-01-29 DIAGNOSIS — G4733 Obstructive sleep apnea (adult) (pediatric): Secondary | ICD-10-CM

## 2022-01-29 DIAGNOSIS — Z8709 Personal history of other diseases of the respiratory system: Secondary | ICD-10-CM

## 2022-01-29 DIAGNOSIS — I10 Essential (primary) hypertension: Secondary | ICD-10-CM

## 2022-01-29 DIAGNOSIS — Z6841 Body Mass Index (BMI) 40.0 and over, adult: Secondary | ICD-10-CM

## 2022-01-29 DIAGNOSIS — I2510 Atherosclerotic heart disease of native coronary artery without angina pectoris: Secondary | ICD-10-CM

## 2022-01-29 NOTE — Patient Instructions (Addendum)
Medication Instructions:   No changes  *If you need a refill on your cardiac medications before your next appointment, please call your pharmacy*   Lab Work:  CBC  If you have labs (blood work) drawn today and your tests are completely normal, you will receive your results only by: [place] (if you have MyChart) OR A paper copy in the mail If you have any lab test that is abnormal or we need to change your treatment, we will call you to review the results.   Testing/Procedures: Not needed   Follow-Up: At [facility], you and your health needs are our priority.  As part of our continuing mission to provide you with exceptional heart care, we have created designated Provider Care Teams.  These Care Teams include your primary Cardiologist (physician) and Advanced Practice Providers (APPs -  Physician Assistants and Nurse Practitioners) who all work together to provide you with the care you need, when you need it.     Your next appointment:   6 month(s)  The format for your next appointment:   In Person  Provider:   Diona Browner, NP Then, Glenetta Hew, MD will plan to see you again in 12 month(s).

## 2022-01-29 NOTE — Progress Notes (Signed)
Primary Care Provider: Ladell Pier, MD [place] Cardiologist: Glenetta Hew, MD Electrophysiologist: None  Clinic Note: Chief Complaint  Patient presents with   Follow-up    "Diastolic HF"   ===================================  ASSESSMENT/PLAN   Problem List Items Addressed This Visit       Cardiology Problems   (HFpEF) heart failure with preserved ejection fraction (HCC) -> although echo suggests normal diastolic parameters with normal left atrial size - Primary (Chronic)    Borderline diagnosis of what is probably hypertensive heart disease.  I also suspect this is component of OHS involved with her need for CPAP.  Plan:  Continue current meds-she is only able to tolerate 2.5 mg amlodipine along with carvedilol 25 mg daily, valsartan 160 mg daily, spironolactone 25 mg daily. She takes [place] and daily 80 mg Lasix.  Not requiring any additional doses but we discussed sliding scale.      Relevant Orders   CBC (Completed)   Hyperlipidemia with target LDL less than 100 (Chronic)    She is now on atorvastatin.  Tolerating well.  Most recent lipids showed LDL of 104.  Follow-up closely as we may need to be more aggressive based simply on her risk factors.  This could be an indication for checking Coronary Calcium Score Coronary CTA.      CAD (coronary artery disease) (Chronic)    Nonobstructive CAD by report.  On stable regimen.  With her being on apixaban, not to be on aspirin.  She is otherwise on beta-blocker, and calcium channel blocker, ARB, statin.  She is also on GLP-1 agonist and SGLT2 inhibitor.  In the absence of symptoms, will hold ischemic evaluation.  Would be nice to eventually get a Coronary CTA on her if symptoms warrant.      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  Clearly has component of  lymphedema.  Recommend foot elevation, support stockings as well as diuretics. Continue long-term apixaban.      Relevant Orders   CBC (Completed)   Essential hypertension (Chronic)     Other   Chronic anemia (Chronic)    Followed by hematology.  No signs of bleeding on Eliquis.  Intermittent iron infusions versus transfusion.      Relevant Orders   CBC (Completed)   Lymphedema (Chronic)    Bilateral.  She supposedly has a lymphedema pump.  Seen by vascular surgery.  Recommended Unaboot or wrap; foot elevation This is not usually very well managed by oral diuretic, but continue on diuretic for volume control.      Morbid obesity with BMI of 50.0-59.9, adult ([place]) (Chronic)    Morbidly obese but has lost weight now since starting Ozempic.      OSA (obstructive sleep apnea) (Chronic)    Follow-up with Dr. Irene Pap.  Concern for there being a evidence of OHS      Relevant Orders   CBC (Completed)    ===================================  HPI:    [name] is a 51 y.o. female with a PMH below who presents today for 73-monthfollow-up.  PMH = nonobstructive CAD, "chronic diastolic heart failure", hypertension, BLE chronic venous insufficiency/lymphedema, chronic DVT of lower extremity on Eliquis (LLE ulcer - post-phlebitic), OSA, type 2 diabetes, and obesity; iron deficiency anemia (intermittent iron infusions and blood transfusions); "CHF" diagnosed with a symptom of dyspnea that  occurred during PRBC infusion in August 2021.  I saw her in March 2023 for hospital follow-up-hospitalized late January-early February.  Had dyspnea and lower extremity pain/swelling. "  Felt to be heart failure exacerbation ".  Pulmonary venous congestion and bilateral lower extremity edema noted.  Treated with IV Lasix with good response-10 L diuresis.  Echo indicated normal EF with no diastolic dysfunction. => Told to start CPAP (Sleep Study from December) and placed on 80 mg Lasix twice  daily  [name] was last seen on 09/18/2021 by Diona Browner, NP.  Stable overall from cardiac standpoint.  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  Not adequately covered with her anxiety medicine.  Generalized fatigue-concern for anemia.  Occasional dyspnea and mild chest comfort with anxiety.  No significant exertional dyspnea or weight gain/edema.  No PND or orthopnea. Not felt to have a need for ischemic evaluation: Continued on amlodipine, carvedilol, Farxiga, Lasix, spironolactone, losartan and Lipitor. Talked about sodium recommendations with avoiding salty 6 foods and other foods high in salt. => Lasix decreased to 80 mg daily.  Have actually recommended that she stay active with dehydrated.  Recent Hospitalizations: None  Reviewed  CV studies:    The following studies were reviewed today: (if available, images/films reviewed: From Epic Chart or Care Everywhere) TTE 05/18/2021:: EF 60 to 65%.  No RWMA.  Mild concentric LVH.  "Normal diastolic parameters ".  Normal longitudinal strain.  Normal PAP, RAP.  Normal aortic and mitral valves.   Interval History:   [name] returns for 4-6-monthfollow-up doing relatively well.  She says her blood pressures have been much better controlled. Occasional palpitations. Taking lower dose of amlodipine (1/2 tablet) because of low blood pressures and dizziness.  Has not required additional doses of furosemide. Was started on CPAP, and due for pulmonary follow-up soon to discuss CPAP. She sleeps on a couple pillows mostly because of back pain and not because of orthopnea.  She is due for to get labs checked soon by hematology-CBC.  Cardiovascular ROS: positive for - mild exertional dyspnea and baseline shortness of breath but better with weight loss.  Occasional but none lasting palpitations. negative for - chest pain, edema, irregular heartbeat, orthopnea, palpitations, paroxysmal nocturnal dyspnea,  rapid heart rate, or syncope or near syncope, TIA/amaurosis fugax, claudication   REVIEWED OF SYSTEMS   Review of Systems  Constitutional:  Positive for malaise/fatigue (Fatigue, partly related to anemia.  Not sleeping well.) and weight loss (Notable loss.-20 pounds in 3 months?).  HENT:  Negative for nosebleeds.   Respiratory:  Positive for shortness of breath.   Gastrointestinal:  Negative for blood in stool and melena.  Genitourinary:  Negative for hematuria.  Musculoskeletal:  Positive for back pain and joint pain.  Neurological:  Positive for dizziness (And lightheadedness.). Negative for focal weakness and loss of consciousness (Or near syncope.).  Endo/Heme/Allergies:  Positive for environmental allergies.  Psychiatric/Behavioral:  Negative for memory loss. The patient is nervous/anxious. The patient does not have insomnia.    I have reviewed and (if needed) personally updated the patient's problem list, medications, allergies, past medical and surgical history, social and family history.   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   Morbid obesity due to excess calories (HCC)    OSA (obstructive sleep apnea) 07/11/2021   Sleep study April 06, 2019 (Dr. Halford Chessman): Moderate OSA (AHI of 24.4 and SPO2 low of 77%).  Majority events during REM sleep.-recommend CPAP, oral appliance or surgical. Reviewed sleep hygiene.  Avoid sedatives.     Osteoarthritis of left knee    Prediabetes    Small bowel obstruction ([place])    as a child   Speech impediment    Stutter / stammer    PAST SURGICAL HISTORY   Past Surgical History:  Procedure Laterality Date   ABDOMINAL WALL DEFECT REPAIR  1970   IR CV LINE INJECTION  10/24/2020   IR REMOVAL TUN ACCESS W/ PORT W/O FL MOD SED  05/28/2021   IVC FILTER INSERTION  2017   Lower Extremity Venous Duplex  06/23/2020   No evidence of DVT or superficial thrombosis bilaterally.  No evidence of deep venous insufficiency bilaterally.  No evidence of SSV reflux.  Right GSV in the calf has reflux, no reflux in L GSV.;  Repeated in July 2020-no DVT   [place]  2014   TRANSTHORACIC ECHOCARDIOGRAM  05/18/2021   EF 60 to 65%.  No RWMA.  Mild concentric LVH.  "Normal diastolic parameters ".  Normal longitudinal strain.  Normal PAP, RAP.  Normal aortic and mitral valves.==> In July 2022, echo read as GR 1 DD otherwise stable.    Immunization History  Administered Date(s) Administered   Influenza,inj,Quad PF,6+ Mos 06/16/2020, 03/01/2021   PFIZER Comirnaty(Gray Top)Covid-19 Tri-Sucrose Vaccine 06/22/2021   PNEUMOCOCCAL CONJUGATE-20 03/01/2021   Tdap 06/16/2020   Zoster Recombinat (Shingrix) 04/07/2020, 11/22/2021    MEDICATIONS/ALLERGIES   Current Meds  Medication Sig   Accu-Chek Softclix Lancets lancets Use as instructed (Patient taking differently: 1 each by Other route See admin instructions. Use as instructed)   acetaminophen (TYLENOL) 500 MG tablet Take 1 tablet (500 mg total) by mouth every 6 (six) hours as needed. (Patient  taking differently: Take 500 mg by mouth every 6 (six) hours as needed for mild pain.)   acetaminophen (TYLENOL) 650 MG CR tablet    albuterol (PROAIR HFA) 108 (90 Base) MCG/ACT inhaler INHALE TWO PUFFS BY MOUTH EVERY 6 HOURS AS NEEDED FOR WHEEZING OR SHORTNESS OF BREATH (Patient taking differently: Inhale 2 puffs into the lungs See admin instructions. INHALE TWO PUFFS BY MOUTH EVERY 6 HOURS AS NEEDED FOR WHEEZING OR SHORTNESS OF BREATH)   amLODipine (NORVASC) 5 MG tablet Take 0.5 tablets (2.5 mg total) by mouth daily.   apixaban (ELIQUIS) 5 MG TABS tablet Take 1 tablet (5 mg total) by mouth 2 (two) times daily.   atorvastatin (LIPITOR) 40 MG tablet Take 1 tablet (40 mg total) by mouth daily.   Blood Glucose Calibration (ACCU-CHEK GUIDE CONTROL)  LIQD 1 each by In Vitro route daily.   Blood Glucose Monitoring Suppl (ACCU-CHEK GUIDE) w/Device KIT 1 each by Does not apply route 2 (two) times daily.   busPIRone (BUSPAR) 7.5 MG tablet Take 1 tablet by mouth 2 (two) times daily.   Carboxymethylcellulose Sodium (EYE DROPS OP) Place 1 drop into both eyes daily as needed (dry eyes).   carvedilol (COREG) 25 MG tablet Take 1 tablet (25 mg total) by mouth 2 (two) times daily with a meal.   cholecalciferol (VITAMIN D3) 25 MCG (1000 UNIT) tablet Take 1 tablet (1,000 Units total) by mouth daily.   FEROSUL 325 (65 Fe) MG tablet TAKE ONE TABLET BY MOUTH DAILY WITH BREAKFAST   furosemide (LASIX) 80 MG tablet Take 1 tablet (80 mg total) by mouth daily. Take additional dose of lasix for weight gain, leg swelling   gabapentin (NEURONTIN) 100 MG capsule Take 1 capsule (100 mg total) by mouth 3 (three) times daily.   glucose blood test strip Use as instructed (Patient taking differently: 1 each by Other route See admin instructions. Use as instructed)   hydrocortisone cream 1 % Apply 1 application topically daily as needed for itching.   hydrOXYzine (VISTARIL) 25 MG capsule Take 1 capsule (25 mg total) by mouth at bedtime  as needed. (Patient taking differently: Take 25 mg by mouth at bedtime as needed for anxiety or itching.)   methocarbamol (ROBAXIN) 500 MG tablet Take 1 tablet (500 mg total) by mouth 2 (two) times daily as needed for muscle spasms.   nitroGLYCERIN (NITROSTAT) 0.4 MG SL tablet Place 1 tablet (0.4 mg total) under the tongue every 5 (five) minutes as needed for chest pain.   PARoxetine (PAXIL) 20 MG tablet Take 1 tablet (20 mg total) by mouth daily.   potassium chloride SA (KLOR-CON M20) 20 MEQ tablet Take 1 tablet (20 mEq total) by mouth daily.   spironolactone (ALDACTONE) 25 MG tablet Take 1 tablet (25 mg total) by mouth daily.   valsartan (DIOVAN) 160 MG tablet Take 1 tablet (160 mg total) by mouth daily.   vitamin B-12 (CYANOCOBALAMIN) 1000 MCG tablet Take 1,000 mcg by mouth daily.   []  dapagliflozin propanediol (FARXIGA) 10 MG TABS tablet Take 1 tablet (10 mg total) by mouth daily.   []  mometasone-formoterol (DULERA) 200-5 MCG/ACT AERO Inhale 2 puffs into the lungs in the morning and at bedtime.   []  Semaglutide,0.25 or 0.5MG/DOS, (OZEMPIC, 0.25 OR 0.5 MG/DOSE,) 2 MG/3ML SOPN Inject 0.5 mg into the skin once a week.    Allergies  Allergen Reactions   Ace Inhibitors Rash and Other (See Comments)    Make pt bleed   Aspirin Other (See Comments)    Per patient paperwork: blood clot?  Likely because of chronic DOAC   Hydromorphone Hives and Itching   Vancomycin Itching and Rash   Contrast Media [Iodinated Contrast Media] Hives   Dilaudid [Hydromorphone Hcl] Hives   Lidocaine Itching    Itching with Lidocaine patch reported 06/13/2021 vis telephone message.    SOCIAL HISTORY/FAMILY HISTORY   Reviewed in Epic:  Pertinent findings:  Social History   Tobacco Use   Smoking status: Former   Smokeless tobacco: Never  Scientific laboratory technician Use: Never used  Substance Use Topics   Alcohol use: Not Currently   Drug use: Not Currently   Social History   Social History Narrative   Not on  file    OBJCTIVE -PE, EKG, labs   Wt Readings from Last  3 Encounters:  01/29/22 (!) 323 lb 6.4 oz (146.7 kg)  01/03/22 (!) 340 lb 8 oz (154.4 kg)    Physical Exam: BP 132/89   Pulse 97   Ht 5' 2"  (1.575 m)   Wt (!) 323 lb 6.4 oz (146.7 kg)   SpO2 100%   BMI 59.15 kg/m  Physical Exam Vitals reviewed.  Constitutional:      General: She is not in acute distress.    Appearance: Normal appearance. She is ill-appearing (Chronically obese, debilitated). She is not toxic-appearing.     Comments: Super morbidly obese with a BMI of 59.  Makes exam very difficult.  HENT:     Head: Normocephalic and atraumatic.  Neck:     Vascular: No carotid bruit.  Cardiovascular:     Rate and Rhythm: Normal rate and regular rhythm.     Pulses: Normal pulses.     Heart sounds: No murmur heard.    No friction rub. No gallop.  Pulmonary:     Effort: Pulmonary effort is normal. No respiratory distress.     Breath sounds: Normal breath sounds. No wheezing or rales.  Musculoskeletal:     Cervical back: Normal range of motion and neck supple.  Skin:    General: Skin is warm.     Coloration: Skin is not jaundiced or pale.     Findings: No lesion or rash.  Neurological:     General: No focal deficit present.     Mental Status: She is alert and oriented to person, place, and time.     Gait: Gait normal.  Psychiatric:        Behavior: Behavior normal.        Thought Content: Thought content normal.     Comments: Somewhat anxious and nervous.     Adult ECG Report Not checked  Recent Labs:  reviewed  11/22/2021: A1c 6.4; 01/03/2022: Cr 0.81, K+ 3.9. Lab Results  Component Value Date   CHOL 174 11/22/2021   HDL 52 11/22/2021   LDLCALC 104 (H) 11/22/2021   TRIG 99 11/22/2021   CHOLHDL 3.3 11/22/2021       01/31/2022    1:41 PM 11/22/2021    3:06 PM  CBC  WBC 4.5  6.1   Hemoglobin 12.3  11.6   Hematocrit 38.8  37.3   Platelets 109  141     Lab Results  Component Value Date   HGBA1C  6.4 11/22/2021   No results found for: "TSH"  ================================================== I spent a total of 13 min with the patient spent in direct patient consultation.  Additional time spent with chart review  / charting (studies, outside notes, etc): 20 min Total Time: 33 min  Current medicines are reviewed at length with the patient today.  (+/- concerns) n/a   Notice: This dictation was prepared with Dragon dictation along with smart phrase technology. Any transcriptional errors that result from this process are unintentional and may not be corrected upon review.  Studies Ordered:   Orders Placed This Encounter  Procedures   CBC   No orders of the defined types were placed in this encounter.   Patient Instructions / Medication Changes & Studies & Tests Ordered   Patient Instructions  Medication Instructions:   No changes  *If you need a refill on your cardiac medications before your next appointment, please call your pharmacy*   Lab Work:  CBC  If you have labs (blood work) drawn today and your tests are completely normal,  you will receive your results only by: [place] (if you have MyChart) OR A paper copy in the mail If you have any lab test that is abnormal or we need to change your treatment, we will call you to review the results.   Testing/Procedures: Not needed   Follow-Up: At [facility], you and your health needs are our priority.  As part of our continuing mission to provide you with exceptional heart care, we have created designated Provider Care Teams.  These Care Teams include your primary Cardiologist (physician) and Advanced Practice Providers (APPs -  Physician Assistants and Nurse Practitioners) who all work together to provide you with the care you need, when you need it.     Your next appointment:   6 month(s)  The format for your next appointment:   In Person  Provider:   Diona Browner, NP Then, Glenetta Hew, MD will  plan to see you again in 12 month(s).       Leonie Man, MD, MS Glenetta Hew, M.D., M.S. Interventional Cardiologist  [facility]  Pager # [phone] Phone # [phone] [place], [place] [address]   Thank you for choosing [facility]!!

## 2022-01-31 DIAGNOSIS — I5032 Chronic diastolic (congestive) heart failure: Secondary | ICD-10-CM | POA: Diagnosis not present

## 2022-01-31 DIAGNOSIS — G4733 Obstructive sleep apnea (adult) (pediatric): Secondary | ICD-10-CM | POA: Diagnosis not present

## 2022-01-31 DIAGNOSIS — D649 Anemia, unspecified: Secondary | ICD-10-CM | POA: Diagnosis not present

## 2022-01-31 DIAGNOSIS — I82562 Chronic embolism and thrombosis of left calf muscular vein: Secondary | ICD-10-CM | POA: Diagnosis not present

## 2022-02-01 LAB — CBC
Hematocrit: 38.8 % (ref 34.0–46.6)
Hemoglobin: 12.3 g/dL (ref 11.1–15.9)
MCH: 27.7 pg (ref 26.6–33.0)
MCHC: 31.7 g/dL (ref 31.5–35.7)
MCV: 87 fL (ref 79–97)
Platelets: 109 10*3/uL — ABNORMAL LOW (ref 150–450)
RBC: 4.44 x10E6/uL (ref 3.77–5.28)
RDW: 12.3 % (ref 11.7–15.4)
WBC: 4.5 10*3/uL (ref 3.4–10.8)

## 2022-02-04 ENCOUNTER — Ambulatory Visit: Payer: Medicaid Other | Attending: Internal Medicine | Admitting: Pharmacist

## 2022-02-04 ENCOUNTER — Encounter: Payer: Medicaid Other | Attending: Internal Medicine | Admitting: Dietician

## 2022-02-04 ENCOUNTER — Encounter: Payer: Self-pay | Admitting: Dietician

## 2022-02-04 DIAGNOSIS — E118 Type 2 diabetes mellitus with unspecified complications: Secondary | ICD-10-CM

## 2022-02-04 DIAGNOSIS — E1169 Type 2 diabetes mellitus with other specified complication: Secondary | ICD-10-CM

## 2022-02-04 DIAGNOSIS — E669 Obesity, unspecified: Secondary | ICD-10-CM | POA: Insufficient documentation

## 2022-02-04 DIAGNOSIS — E739 Lactose intolerance, unspecified: Secondary | ICD-10-CM | POA: Diagnosis not present

## 2022-02-04 MED ORDER — SEMAGLUTIDE (1 MG/DOSE) 4 MG/3ML ~~LOC~~ SOPN: 1.0000 mg | PEN_INJECTOR | SUBCUTANEOUS | 2 refills | Status: DC

## 2022-02-04 NOTE — Patient Instructions (Addendum)
Continue to make changes that you can stick with.  Great job on changes made. Increase your Dark Green Leafy vegetables such as Broccoli, kale, collards, spinach. Continue to use Mrs. Dash rather than salt. Balance your meals Eat breakfast, lunch, dinner daily. Listen to your body.  Eat slowly and stop when satisfied/full. Eat mostly plants Small amount of lean protein at each meal  Drink water rather than juice most of the time. Continue to stay active

## 2022-02-04 NOTE — Progress Notes (Signed)
    S:    [name] is a 51 y.o. female who presents for diabetes evaluation, education, and management. PMH is significant for CAD, CHF (EF 55-60% on echo 11/2019) HTN, DM, DVT on lifelong anticoagulation and IVC filter present since 2016, asthma, anxiety, iron deficiency anemia. Patient was referred and last seen by Primary Care Provider, Dr. [name], on 01/03/2022.   At last visit, Ozempic was increased from 0.25 to 0.5 mg weekly.   Today, patient arrives in good spirits and presents without any assistance. Reports 11 lb weight loss since June 2023. Denies GI upset related to Ozempic.   Patient reports Diabetes was diagnosed in 2014.   Family/Social History:  -grandmother and mother: diabetes  Current diabetes medications include: Ozempic 0.5 mg weekly (Wednesdays), Farxiga 10 mg   Patient reports adherence to taking all medications as prescribed.  Insurance coverage: [employer] Managed Medicaid   Reported home fasting blood sugars: 165-170 mg/dL   Patient reports nocturia (nighttime urination). 4x/night Patient reports neuropathy (nerve pain). Has some tingling on her palms that radiates down to the arms, lasts ~40 mins.  Patient denies visual changes. Patient reports self foot exams.   Patient reported dietary habits: stays away from greasy food, chips, soda. Drinking more juice and water.   O:  Lab Results  Component Value Date   HGBA1C 6.4 11/22/2021    Lipid Panel     Component Value Date/Time   CHOL 174 11/22/2021 1506   TRIG 99 11/22/2021 1506   HDL 52 11/22/2021 1506   CHOLHDL 3.3 11/22/2021 1506   LDLCALC 104 (H) 11/22/2021 1506    Clinical Atherosclerotic Cardiovascular Disease (ASCVD): Yes  The 10-year ASCVD risk score (Arnett DK, et al., 2019) is: 10.4%   Values used to calculate the score:     Age: 31 years     Sex: Female     Is Non-Hispanic African American: Yes     Diabetic: Yes     Tobacco smoker: No     Systolic Blood Pressure: 767 mmHg      Is BP treated: Yes     HDL Cholesterol: 52 mg/dL     Total Cholesterol: 174 mg/dL   Patient is participating in a Managed Medicaid Plan:  Yes   A/P: Diabetes longstanding currently at goal based on A1c (6.4%). Patient is able to verbalize appropriate hypoglycemia management plan. Medication adherence appears appropriate. Congratulated patient on her weight loss success.  -Increased dose of GLP-1 Ozempic (semaglutide) from 0.5 to 1 mg weekly. -Continued SGLT2-I Farxiga (dapagliflozin) 10 mg daily. Counseled on sick day rules. -Extensively discussed pathophysiology of diabetes, recommended lifestyle interventions, dietary effects on blood sugar control.  -Counseled on s/sx of and management of hypoglycemia.  -Next A1c anticipated January 2024.   Written patient instructions provided. Patient verbalized understanding of treatment plan.  Total time in face to face counseling 20 minutes.    Follow-up:  Pharmacist 4-6 weeks. PCP clinic visit on 05/07/2022.   Joseph Art, Pharm.D. PGY-2 Ambulatory Care Pharmacy Resident 02/04/2022 1:52 PM

## 2022-02-04 NOTE — Progress Notes (Signed)
Medical Nutrition Therapy  Appointment Start time:  [phone] Appointment End time:  1150 She was last seen by this RD on 09/28/2021 Primary concerns today: . She would like to lose weight.  She states that she did not qualify for weight loss surgery due to high risk. [name] Referral diagnosis: Type 2 Diabetes with obesity Preferred learning style: no preference indicated Learning readiness: ready  NUTRITION ASSESSMENT  Patient is here today alone. She started Ozempic She has changed her diet to reduce fat Eating less snacks, chips, and drinks Uses cranberry juice She is using the stepper, bike and walking at the gym 2 times per week. Walks around complex on other days. She has lost 11 lbs in the past 4 months. She states that she feels great. Blood glucose 146 after lunch. Blood pressure 142/88 yesterday per home monitor and monitors this frequently.   She has lost 11 lbs in the past 4 months.  She spoke to her brother who is a Tax adviser.  He is a vegetarian.  She is trying to wean off meat.  Anthropometrics  62" 331 lbs 02/04/2022 342 lbs 09/28/2021 351 lbs 06/21/2021 and 347 lbs at home  345 lbs 04/27/2021  Obese since 51 yo and gained more weight since her father died in 2003/07/15 389 lbs 3 weeks ago States that she lost by stopping eating due to frustration over her weight. Goal:  290 lbs  Clinical Medical Hx: Type 2 Diabetes July 14, 2000), OSA (just tested and not on C-pap yet), iron deficient anemia, CAD, cellulitis, CHF Medications: see list to include Farxiga, lasix, spironolactone, iron, vitamin B-12, Klor con, vitamin D, Ozempic Labs: 6.5% 11/24/2020 Notable Signs/Symptoms: 165 fasting glucose  Lifestyle & Dietary Hx Patient lives with her mother.  They share shopping and cooking.   She does not drive and takes an [place] or her mother takes her. She is on food stamps.  Fluid intake:  2 L daily Sleep: Now sleeping about 4 1/2 hours, 03/2021 visit:   "not sleeping at all"  4 am-6 am - "so  tired, can't fall asleep", Sometimes she is scared to sleep for fear of not waking up. Stress / self-care: high.   Current average weekly physical activity:  Takes them to the dog park (10 blocks away) twice a week and is doing armchair exercise videos 3 times per day.  24-Hr Dietary Recall Lactose intolerant Does not use added salt. Trying to eat 1 meal per day First Meal: boiled egg, toast, 1/2 cup OJ Snack: none Second none Snack: salad and fruit Third Meal: Oodles of noodles, occasional bread OR 1/2 sandwich Snack: none, trying to avoid Beverages: water, juice, coffee with 1 sugar  NUTRITION DIAGNOSIS  NB-1.1 Food and nutrition-related knowledge deficit As related to balance of carbohydrates, protein, and fat for weight loss.  As evidenced by diet hx and patient report.   NUTRITION INTERVENTION  Nutrition education (E-1) on the following topics:  Encouraged her to continue to stay active and benefits on blood glucose Encouraged her to continue lifestyle changes  Reviewed mindfulness, choices and hunger and fullness cues Reviewed basic meal planning with focus on meatless meals as well as meals with meat as patient is doing both Reviewed sodium content of her foods and tips to decrease and balance meals.  Handouts Provided Include: Anemia nutrition Therapy from AND Snack sheet My plate ADA book How to Thrive:  A guide for your journey with diabetes Diabetes resource page Heart healthy, consistent carbohydrate nutrition therapy from  AND Meal plan card 1500 calorie sample meal plan from AND (5 day) ACLM [employer] Anemia nutrition therapy from [facility] Teaching method utilized: Visual & Auditory  Demonstrated degree of understanding via: Teach Back  Barriers to learning/adherence to lifestyle change: lifelong habits, motivation  Goals Continue to make changes that you can stick with.  Great job on changes made. Increase  your Dark Green Leafy vegetables such as Broccoli, kale, collards, spinach. Continue to use Mrs. Dash rather than salt. Balance your meals Eat breakfast, lunch, dinner daily. Listen to your body.  Eat slowly and stop when satisfied/full. Eat mostly plants Small amount of lean protein at each meal  Drink water rather than juice most of the time. Continue to stay active  MONITORING & EVALUATION Dietary intake, weekly physical activity, and label reading in 3-4 months.  Next Steps  Patient is to call for questions.

## 2022-02-05 ENCOUNTER — Telehealth: Payer: Self-pay | Admitting: Emergency Medicine

## 2022-02-05 NOTE — Progress Notes (Deleted)
Synopsis: Referred for asthma by Ladell Pier, MD  Subjective:   PATIENT ID: [name] GENDER: female DOB: 1970-09-13, MRN: [id number]  No chief complaint on file.   51yF with history of recurrent DVT, IDA with port for iron infusions, obesity, diastolic dysfunction, OSA last PSG done years ago in [place] in 2018 and was on CPAP referred for asthma followeing recent hospitalization for acute on chronic diastolic HF exacerbation, possible asthma exacerbation, discharged 8/1.   She says that prior to last hospitalization she had gone to cancer center to get her portacath flushed. She rapidly developed worsening cough when she got home. What she understands of hospitalization is that she had a whole lot of fluid on her heart which they relieved with lasix. She is unsure what made biggest difference with her recovery - whether it was treatment of CHF or her inhalers. She is unsure what her dry weight is. She wonders if stress is a trigger for her asthma. She does not have much of a cough currently. DOE to 10 minutes which has very gradually worsened over last couple of years.   Regarding her OSA her DME supplier had closed up shop by the time she tried to call with issues she was having with her machine. She does have a whole lot of daytime sleepiness. She does not snore. She does not have PND.   Father had emphysema, uncle with stomach cancer, grandmother died of breast cancer  She has 42 pack year smoking history quit in 2005  She is disabled, in past worked at [employer], Art therapist. SHe has two dogs at home, no pet bird.   Interval HPI:  PFT with moderately severe obstruction and reduced FVC, neg BD response  PSG with moderate OSA  Admission for acute on chronic diastolic heart failure in 05/2021  Otherwise pertinent review of systems is negative.    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   Morbid obesity due to excess calories (HCC)    OSA (obstructive sleep apnea) 07/11/2021   Sleep study April 06, 2019 (Dr. Halford Chessman): Moderate OSA (AHI of 24.4 and SPO2 low of 77%).  Majority events during REM sleep.-recommend CPAP, oral appliance or surgical. Reviewed sleep hygiene.  Avoid sedatives.     Osteoarthritis of left knee    Prediabetes    Small bowel obstruction ([place])    as a child   Speech impediment    Stutter / stammer     Family History  Problem Relation Age of Onset   Diabetes Mellitus II Mother    COPD Father  Diabetes Father    Diabetes Mellitus II Maternal Grandmother    Breast cancer Paternal Grandfather      Past Surgical History:  Procedure Laterality Date   ABDOMINAL WALL DEFECT REPAIR  1970   IR CV LINE INJECTION  10/24/2020   IR REMOVAL TUN ACCESS W/ PORT W/O FL MOD SED  05/28/2021   IVC FILTER INSERTION  2017   Lower Extremity Venous Duplex  06/23/2020   No evidence of DVT or superficial thrombosis bilaterally.  No evidence of deep venous insufficiency bilaterally.  No evidence of SSV reflux.  Right GSV in the calf has reflux, no reflux in L GSV.;   Repeated in July 2020-no DVT   [place]  2014   TRANSTHORACIC ECHOCARDIOGRAM  11/25/2020   EF 55 to 60%.  Normal LV function.  No RWM A.  Mild LVH.  GR 1 DD.  Normal PAP.  Normal aortic and mitral valve.  Mildly elevated RAP  ~8 mmHg.    Social History   Socioeconomic History   Marital status: Single    Spouse name: Not on file   Number of children: 0   Years of education: Not on file   Highest education level: 12th grade  Occupational History   Occupation: unemployed on disablity  Tobacco Use   Smoking status: Former   Smokeless tobacco: Never  Scientific laboratory technician Use: Never used  Substance and Sexual Activity   Alcohol use: Not Currently   Drug use: Not Currently   Sexual activity: Not Currently  Other Topics Concern   Not on file  Social History Narrative   Not on file   Social Determinants of Health   Financial Resource Strain: Low Risk  (09/06/2021)   Overall Financial Resource Strain (CARDIA)    Difficulty of Paying Living Expenses: Not very hard  Food Insecurity: No Food Insecurity (10/10/2021)   Hunger Vital Sign    Worried About Running Out of Food in the Last Year: Never true    Ran Out of Food in the Last Year: Never true  Transportation Needs: No Transportation Needs (10/10/2021)   PRAPARE - Hydrologist (Medical): No    Lack of Transportation (Non-Medical): No  Physical Activity: Sufficiently Active (12/14/2021)   Exercise Vital Sign    Days of Exercise per Week: 7 days    Minutes of Exercise per Session: 30 min  Stress: Stress Concern Present (08/21/2021)   [place]    Feeling of Stress : To some extent  Social Connections: Socially Isolated (09/06/2021)   Social Connection and Isolation Panel [NHANES]    Frequency of Communication with Friends and Family: More than three times a week    Frequency of Social  Gatherings with Friends and Family: More than three times a week    Attends Religious Services: Never    Marine scientist or Organizations: No    Attends Archivist Meetings: Never    Marital Status: Never married  Intimate Partner Violence: Not At Risk (11/13/2021)   Humiliation, Afraid, Rape, and Kick questionnaire    Fear of Current or Ex-Partner: No    Emotionally Abused: No    Physically Abused: No    Sexually Abused: No     Allergies  Allergen Reactions   Ace Inhibitors Rash and Other (See Comments)    Make pt bleed  Aspirin Other (See Comments)    Per patient paperwork: blood clot?  Likely because of chronic DOAC   Hydromorphone Hives and Itching   Vancomycin Itching and Rash   Contrast Media [Iodinated Contrast Media] Hives   Dilaudid [Hydromorphone Hcl] Hives   Lidocaine Itching    Itching with Lidocaine patch reported 06/13/2021 vis telephone message.     Outpatient Medications Prior to Visit  Medication Sig Dispense Refill   Accu-Chek Softclix Lancets lancets Use as instructed (Patient taking differently: 1 each by Other route See admin instructions. Use as instructed) 100 each 12   acetaminophen (TYLENOL) 500 MG tablet Take 1 tablet (500 mg total) by mouth every 6 (six) hours as needed. (Patient taking differently: Take 500 mg by mouth every 6 (six) hours as needed for mild pain.) 60 tablet 0   acetaminophen (TYLENOL) 650 MG CR tablet      albuterol (PROAIR HFA) 108 (90 Base) MCG/ACT inhaler INHALE TWO PUFFS BY MOUTH EVERY 6 HOURS AS NEEDED FOR WHEEZING OR SHORTNESS OF BREATH (Patient taking differently: Inhale 2 puffs into the lungs See admin instructions. INHALE TWO PUFFS BY MOUTH EVERY 6 HOURS AS NEEDED FOR WHEEZING OR SHORTNESS OF BREATH) 8.5 g 0   amLODipine (NORVASC) 5 MG tablet Take 0.5 tablets (2.5 mg total) by mouth daily. 45 tablet 0   apixaban (ELIQUIS) 5 MG TABS tablet Take 1 tablet (5 mg total) by mouth 2 (two) times daily. 180 tablet 1    atorvastatin (LIPITOR) 40 MG tablet Take 1 tablet (40 mg total) by mouth daily. 90 tablet 1   Blood Glucose Calibration (ACCU-CHEK GUIDE CONTROL) LIQD 1 each by In Vitro route daily. 1 each 4   Blood Glucose Monitoring Suppl (ACCU-CHEK GUIDE) w/Device KIT 1 each by Does not apply route 2 (two) times daily. 1 kit 0   busPIRone (BUSPAR) 7.5 MG tablet Take 1 tablet by mouth 2 (two) times daily.     Carboxymethylcellulose Sodium (EYE DROPS OP) Place 1 drop into both eyes daily as needed (dry eyes).     carvedilol (COREG) 25 MG tablet Take 1 tablet (25 mg total) by mouth 2 (two) times daily with a meal. 60 tablet 3   cholecalciferol (VITAMIN D3) 25 MCG (1000 UNIT) tablet Take 1 tablet (1,000 Units total) by mouth daily. 30 tablet 5   COVID-19 mRNA Vac-TriS, Pfizer, (PFIZER-BIONT COVID-19 VAC-TRIS) SUSP injection Inject into the muscle. 0.3 mL 0   dapagliflozin propanediol (FARXIGA) 10 MG TABS tablet Take 1 tablet (10 mg total) by mouth daily. 90 tablet 0   diphenhydrAMINE (BENADRYL) 50 MG tablet Take 1 tablet (50 mg total) by mouth once for 1 dose. Pt to take 50 mg of benadryl on 11/06/21 at 7:30 AM. Please call [phone] with any questions. 1 tablet 0   FEROSUL 325 (65 Fe) MG tablet TAKE ONE TABLET BY MOUTH DAILY WITH BREAKFAST 100 tablet 0   furosemide (LASIX) 80 MG tablet Take 1 tablet (80 mg total) by mouth daily. Take additional dose of lasix for weight gain, leg swelling 30 tablet 4   gabapentin (NEURONTIN) 100 MG capsule Take 1 capsule (100 mg total) by mouth 3 (three) times daily. 90 capsule 3   glucose blood test strip Use as instructed (Patient taking differently: 1 each by Other route See admin instructions. Use as instructed) 100 each 12   hydrocortisone cream 1 % Apply 1 application topically daily as needed for itching.     hydrOXYzine (VISTARIL) 25 MG capsule Take 1  capsule (25 mg total) by mouth at bedtime as needed. (Patient taking differently: Take 25 mg by mouth at bedtime as needed  for anxiety or itching.) 30 capsule 1   methocarbamol (ROBAXIN) 500 MG tablet Take 1 tablet (500 mg total) by mouth 2 (two) times daily as needed for muscle spasms. 60 tablet 1   mometasone-formoterol (DULERA) 200-5 MCG/ACT AERO INHALE 2 PUFFS BY MOUTH EVERY MORNING AND INHALE TWO PUFFS BY MOUTH EVERY NIGHT AT BEDTIME 13 g 3   nitroGLYCERIN (NITROSTAT) 0.4 MG SL tablet Place 1 tablet (0.4 mg total) under the tongue every 5 (five) minutes as needed for chest pain. 30 tablet 0   omeprazole (PRILOSEC) 20 MG capsule Take 1 capsule (20 mg total) by mouth daily as needed (acid reflux). 30 capsule 0   PARoxetine (PAXIL) 20 MG tablet Take 1 tablet (20 mg total) by mouth daily. 30 tablet 3   potassium chloride SA (KLOR-CON M20) 20 MEQ tablet Take 1 tablet (20 mEq total) by mouth daily. 30 tablet 4   predniSONE (DELTASONE) 50 MG tablet Pt to take 50 mg of prednisone on 11/05/21 at 7:30 PM, 50 mg of prednisone on 11/06/21 at 1:30 AM, and 50 mg of prednisone on 11/06/21 at 7:30 AM. Pt is also to take 50 mg of benadryl on 11/06/21 at 7:30 AM. Please call [phone] with any questions. (Patient not taking: Reported on 01/29/2022) 3 tablet 0   Semaglutide, 1 MG/DOSE, 4 MG/3ML SOPN Inject 1 mg as directed once a week. 3 mL 2   spironolactone (ALDACTONE) 25 MG tablet Take 1 tablet (25 mg total) by mouth daily. 30 tablet 3   valsartan (DIOVAN) 160 MG tablet Take 1 tablet (160 mg total) by mouth daily. 90 tablet 3   vitamin B-12 (CYANOCOBALAMIN) 1000 MCG tablet Take 1,000 mcg by mouth daily.     No facility-administered medications prior to visit.       Objective:   Physical Exam:  General appearance: 50 y.o., female, NAD, conversant  Eyes: anicteric sclerae, moist conjunctivae; no lid-lag; PERRL, tracking appropriately HENT: NCAT; oropharynx, MMM, no mucosal ulcerations; normal hard and soft palate.?Nasal polyp L nare. Neck: Trachea midline; no lymphadenopathy, no JVD Lungs: Faint crackles in bases, no wheeze,  with normal respiratory effort CV: RRR, no MRGs  Abdomen: Soft, non-tender, obese; non-distended, BS present  Extremities: 1+ BLE edema, warm Skin: Normal temperature, turgor and texture; no rash Psych: Appropriate affect Neuro: Alert and oriented to person and place, no focal deficit    There were no vitals filed for this visit.    on RA BMI Readings from Last 3 Encounters:  01/29/22 59.15 kg/m  01/03/22 54.96 kg/m  11/22/21 62.96 kg/m   Wt Readings from Last 3 Encounters:  01/29/22 (!) 323 lb 6.4 oz (146.7 kg)  01/03/22 (!) 340 lb 8 oz (154.4 kg)  11/22/21 (!) 344 lb 3.2 oz (156.1 kg)     CBC    Component Value Date/Time   WBC 4.5 01/31/2022 1341   WBC 5.9 09/20/2021 1144   WBC 5.8 05/29/2021 0335   RBC 4.44 01/31/2022 1341   RBC 4.13 09/20/2021 1144   HGB 12.3 01/31/2022 1341   HCT 38.8 01/31/2022 1341   PLT 109 (L) 01/31/2022 1341   MCV 87 01/31/2022 1341   MCH 27.7 01/31/2022 1341   MCH 27.1 09/20/2021 1144   MCHC 31.7 01/31/2022 1341   MCHC 30.4 09/20/2021 1144   RDW 12.3 01/31/2022 1341   LYMPHSABS 0.9 09/20/2021 1144  LYMPHSABS 0.5 (L) 11/29/2020 1445   MONOABS 0.5 09/20/2021 1144   EOSABS 0.1 09/20/2021 1144   EOSABS 0.0 11/29/2020 1445   BASOSABS 0.0 09/20/2021 1144   BASOSABS 0.0 11/29/2020 1445      Chest Imaging: 11/23/20 CTA Chest reviewed by me with no PE. Prominent azygos which might be related to permcath vs PH. Positive egg and banana sign which could indicate PH. Large heart.  Pulmonary Functions Testing Results:    Latest Ref Rng & Units 01/08/2021   11:29 AM  PFT Results  FVC-Pre L 1.66   FVC-Predicted Pre % 60   FVC-Post L 1.62   FVC-Predicted Post % 59   Pre FEV1/FVC % % 66   Post FEV1/FCV % % 72   FEV1-Pre L 1.09   FEV1-Predicted Pre % 49   FEV1-Post L 1.16    Moderately severe obstruction, reduced fvc, neg BD response   PSG 04/05/21: - Moderate obstructive sleep apnea with an AHI of 24.4 and SpO2 low of 77%.  The  majority of events occurred in REM sleep with a REM AHI of 54. - Supplemental oxygen was not used during the study.  Echocardiogram:  TTE1/28/23:  1. Left ventricular ejection fraction, by estimation, is 60 to 65%. The  left ventricle has normal function. The left ventricle has no regional  wall motion abnormalities. There is mild concentric left ventricular  hypertrophy. Left ventricular diastolic  parameters were normal. The average left ventricular global longitudinal  strain is -19.3 %. The global longitudinal strain is normal.   2. Right ventricular systolic function is normal. The right ventricular  size is normal. There is normal pulmonary artery systolic pressure.   3. The mitral valve is grossly normal. No evidence of mitral valve  regurgitation.   4. The aortic valve is grossly normal. Aortic valve regurgitation is not  visualized. No aortic stenosis is present.  Heart Catheterization: None    Assessment & Plan:   # DOE: likely multifactorial with deconditioning, asthma, volume overload/CHF playing roles.   # Uncontrolled persistent asthma:   # OSA: high probability co-morbid OHS. Will likely need in-lab testing given anticipated severity, co-morbid CHF. Was previously on CPAP via oronasal mask that she tolerated well - last tested  Plan: - spiro pre/post BD, instructed to hold dulera for 1-2 days before spirometry unless she has deterioration while holding dulera in which case she can resume - continue dulera 200 2 puffs twice daily, rinse mouth afterward - albuterol prn - vbg - likely plan to order in lab split night PSG with PAP titration +/- TCO2 depending on vbg result      Maryjane Hurter, MD [facility] 02/05/2022 7:12 PM

## 2022-02-05 NOTE — Telephone Encounter (Signed)
Copied from [place] [phone]. Topic: General - Other >> Feb 05, 2022 12:11 PM Everette C wrote: Reason for CRM: The patient has been directed to contact their PCP and notify them that their prescription for Semaglutide, 1 MG/DOSE, 4 MG/3ML SOPN [[id number]]  is out of stock   The patient's pharmacy shares with the patient that they have been unable to reach the practice   Please contact the patient to discuss alternative rx prescriptions when possible

## 2022-02-05 NOTE — Telephone Encounter (Signed)
Pt called to schedule an appt but there was not anything with dr. [name] until Jan.  Please reach out to the patient if you can get her in sooner.  [phone]

## 2022-02-06 NOTE — Telephone Encounter (Signed)
Please advise.----DD,RMA  

## 2022-02-07 ENCOUNTER — Other Ambulatory Visit: Payer: Self-pay

## 2022-02-07 ENCOUNTER — Emergency Department (HOSPITAL_BASED_OUTPATIENT_CLINIC_OR_DEPARTMENT_OTHER)
Admission: EM | Admit: 2022-02-07 | Discharge: 2022-02-07 | Disposition: A | Discharge status: Home / Self Care | Payer: Medicaid Other | Attending: Emergency Medicine | Admitting: Emergency Medicine

## 2022-02-07 ENCOUNTER — Encounter (HOSPITAL_BASED_OUTPATIENT_CLINIC_OR_DEPARTMENT_OTHER): Payer: Self-pay

## 2022-02-07 ENCOUNTER — Ambulatory Visit: Payer: Medicaid Other | Admitting: Student

## 2022-02-07 ENCOUNTER — Emergency Department (HOSPITAL_BASED_OUTPATIENT_CLINIC_OR_DEPARTMENT_OTHER): Payer: Medicaid Other | Admitting: Radiology

## 2022-02-07 ENCOUNTER — Emergency Department (HOSPITAL_BASED_OUTPATIENT_CLINIC_OR_DEPARTMENT_OTHER): Payer: Medicaid Other

## 2022-02-07 DIAGNOSIS — R42 Dizziness and giddiness: Secondary | ICD-10-CM | POA: Insufficient documentation

## 2022-02-07 DIAGNOSIS — R600 Localized edema: Secondary | ICD-10-CM | POA: Insufficient documentation

## 2022-02-07 DIAGNOSIS — Z7901 Long term (current) use of anticoagulants: Secondary | ICD-10-CM | POA: Diagnosis not present

## 2022-02-07 DIAGNOSIS — E119 Type 2 diabetes mellitus without complications: Secondary | ICD-10-CM | POA: Insufficient documentation

## 2022-02-07 DIAGNOSIS — Z8616 Personal history of COVID-19: Secondary | ICD-10-CM | POA: Diagnosis not present

## 2022-02-07 DIAGNOSIS — I10 Essential (primary) hypertension: Secondary | ICD-10-CM | POA: Diagnosis not present

## 2022-02-07 DIAGNOSIS — M7989 Other specified soft tissue disorders: Secondary | ICD-10-CM | POA: Diagnosis not present

## 2022-02-07 DIAGNOSIS — Z79899 Other long term (current) drug therapy: Secondary | ICD-10-CM | POA: Diagnosis not present

## 2022-02-07 DIAGNOSIS — R609 Edema, unspecified: Secondary | ICD-10-CM

## 2022-02-07 LAB — CBC
HCT: 37.3 % (ref 36.0–46.0)
Hemoglobin: 11.1 g/dL — ABNORMAL LOW (ref 12.0–15.0)
MCH: 27.5 pg (ref 26.0–34.0)
MCHC: 29.8 g/dL — ABNORMAL LOW (ref 30.0–36.0)
MCV: 92.6 fL (ref 80.0–100.0)
Platelets: 134 10*3/uL — ABNORMAL LOW (ref 150–400)
RBC: 4.03 MIL/uL (ref 3.87–5.11)
RDW: 13.4 % (ref 11.5–15.5)
WBC: 4.6 10*3/uL (ref 4.0–10.5)
nRBC: 0 % (ref 0.0–0.2)

## 2022-02-07 LAB — BASIC METABOLIC PANEL
Anion gap: 9 (ref 5–15)
BUN: 6 mg/dL (ref 6–20)
CO2: 29 mmol/L (ref 22–32)
Calcium: 9.6 mg/dL (ref 8.9–10.3)
Chloride: 102 mmol/L (ref 98–111)
Creatinine, Ser: 0.66 mg/dL (ref 0.44–1.00)
GFR, Estimated: >60 mL/min (ref >60)
Glucose, Bld: 96 mg/dL (ref 70–99)
Potassium: 3.4 mmol/L — ABNORMAL LOW (ref 3.5–5.1)
Sodium: 140 mmol/L (ref 135–145)

## 2022-02-07 LAB — HCG, SERUM, QUALITATIVE: Preg, Serum: NEGATIVE

## 2022-02-07 LAB — BRAIN NATRIURETIC PEPTIDE: B Natriuretic Peptide: 65.1 pg/mL (ref 0.0–100.0)

## 2022-02-07 MED ORDER — FUROSEMIDE 10 MG/ML IJ SOLN
80.0000 mg | Freq: Once | INTRAMUSCULAR | Status: AC
  Administered 2022-02-07: 80 mg via INTRAVENOUS
  Filled 2022-02-07: qty 8

## 2022-02-07 MED ORDER — ACETAMINOPHEN 500 MG PO TABS
1000.0000 mg | ORAL_TABLET | Freq: Once | ORAL | Status: AC
  Administered 2022-02-07: 1000 mg via ORAL
  Filled 2022-02-07: qty 2

## 2022-02-07 MED ORDER — POTASSIUM CHLORIDE CRYS ER 20 MEQ PO TBCR
40.0000 meq | EXTENDED_RELEASE_TABLET | Freq: Once | ORAL | Status: AC
  Administered 2022-02-07: 40 meq via ORAL
  Filled 2022-02-07: qty 2

## 2022-02-07 NOTE — ED Triage Notes (Signed)
Patient here POV from Home.  Endorses Bilateral leg Swelling for approximately 5-6 Days. Associated with Dizziness Intermittently for Same Time Frame.   Some Nausea. No Emesis. No Diarrhea. No Fevers.   NAD Noted during Triage. A&Ox4. GCS 15. BIB Wheelchair.

## 2022-02-07 NOTE — Discharge Instructions (Signed)
Keep your legs elevated as much as possible.  Continue taking your home Lasix.  Follow-up with your doctor for recheck within the next few days.  Return to the emergency room if you have any worsening symptoms.

## 2022-02-07 NOTE — ED Provider Notes (Signed)
[facility] EMERGENCY DEPT Provider Note   CSN: [id number] Arrival date & time: 02/07/22  1323     History  Chief Complaint  Patient presents with   Dizziness    [name] is a 51 y.o. female.  Patient is a 51 year old who presents with leg swelling.  She has a history of diabetes, hypertension, DVT on chronic anticoagulation, chronic venous stasis.  She states over the last 4 to 5 days she has had some increase in her bilateral leg swelling.  She says they feel hot and she is having trouble walking on them.  She denies any chest pain or shortness of breath.  No fevers.  No nausea or vomiting.  She has had some intermittent dizziness but denies any currently.  This morning when she stands up.  She has not changed her medicines recently.  She is on Lasix 80 mg twice a day and spironolactone.  She does say that she was helping her mom move and had been up walking more than normal which may have contributed to the swelling.       Home Medications Prior to Admission medications   Medication Sig Start Date End Date Taking? Authorizing Provider  Accu-Chek Softclix Lancets lancets Use as instructed Patient taking differently: 1 each by Other route See admin instructions. Use as instructed 11/29/20   Argentina Donovan, PA-C  acetaminophen (TYLENOL) 500 MG tablet Take 1 tablet (500 mg total) by mouth every 6 (six) hours as needed. Patient taking differently: Take 500 mg by mouth every 6 (six) hours as needed for mild pain. 04/19/20   Ladell Pier, MD  acetaminophen (TYLENOL) 650 MG CR tablet     [provider]  albuterol (PROAIR HFA) 108 (90 Base) MCG/ACT inhaler INHALE TWO PUFFS BY MOUTH EVERY 6 HOURS AS NEEDED FOR WHEEZING OR SHORTNESS OF BREATH Patient taking differently: Inhale 2 puffs into the lungs See admin instructions. INHALE TWO PUFFS BY MOUTH EVERY 6 HOURS AS NEEDED FOR WHEEZING OR SHORTNESS OF BREATH 10/05/20   Ladell Pier, MD   amLODipine (NORVASC) 5 MG tablet Take 0.5 tablets (2.5 mg total) by mouth daily. 11/22/21   Ladell Pier, MD  apixaban (ELIQUIS) 5 MG TABS tablet Take 1 tablet (5 mg total) by mouth 2 (two) times daily. 11/22/21   Ladell Pier, MD  atorvastatin (LIPITOR) 40 MG tablet Take 1 tablet (40 mg total) by mouth daily. 11/22/21   Ladell Pier, MD  Blood Glucose Calibration (ACCU-CHEK GUIDE CONTROL) LIQD 1 each by In Vitro route daily. 12/13/20   Argentina Donovan, PA-C  Blood Glucose Monitoring Suppl (ACCU-CHEK GUIDE) w/Device KIT 1 each by Does not apply route 2 (two) times daily. 11/29/20   Argentina Donovan, PA-C  busPIRone (BUSPAR) 7.5 MG tablet Take 1 tablet by mouth 2 (two) times daily.    [provider]  Carboxymethylcellulose Sodium (EYE DROPS OP) Place 1 drop into both eyes daily as needed (dry eyes).    [provider]  carvedilol (COREG) 25 MG tablet Take 1 tablet (25 mg total) by mouth 2 (two) times daily with a meal. 01/07/22   Ladell Pier, MD  cholecalciferol (VITAMIN D3) 25 MCG (1000 UNIT) tablet Take 1 tablet (1,000 Units total) by mouth daily. 04/07/20   Ladell Pier, MD  COVID-19 mRNA Vac-TriS, Pfizer, (PFIZER-BIONT COVID-19 VAC-TRIS) SUSP injection Inject into the muscle. 06/26/21   Carlyle Basques, MD  dapagliflozin propanediol (FARXIGA) 10 MG TABS tablet Take  1 tablet (10 mg total) by mouth daily. 11/07/21   Ladell Pier, MD  diphenhydrAMINE (BENADRYL) 50 MG tablet Take 1 tablet (50 mg total) by mouth once for 1 dose. Pt to take 50 mg of benadryl on 11/06/21 at 7:30 AM. Please call [phone] with any questions. 10/31/21 10/31/21  Nelson Chimes, MD  FEROSUL 325 (65 Fe) MG tablet TAKE ONE TABLET BY MOUTH DAILY WITH BREAKFAST 06/06/21   Ladell Pier, MD  furosemide (LASIX) 80 MG tablet Take 1 tablet (80 mg total) by mouth daily. Take additional dose of lasix for weight gain, leg swelling 06/01/21   Barb Merino, MD  gabapentin (NEURONTIN) 100  MG capsule Take 1 capsule (100 mg total) by mouth 3 (three) times daily. 01/07/22   Ladell Pier, MD  glucose blood test strip Use as instructed Patient taking differently: 1 each by Other route See admin instructions. Use as instructed 11/29/20   Argentina Donovan, PA-C  hydrocortisone cream 1 % Apply 1 application topically daily as needed for itching.    [provider]  hydrOXYzine (VISTARIL) 25 MG capsule Take 1 capsule (25 mg total) by mouth at bedtime as needed. Patient taking differently: Take 25 mg by mouth at bedtime as needed for anxiety or itching. 04/20/21   Ladell Pier, MD  methocarbamol (ROBAXIN) 500 MG tablet Take 1 tablet (500 mg total) by mouth 2 (two) times daily as needed for muscle spasms. 01/07/22   Ladell Pier, MD  mometasone-formoterol (DULERA) 200-5 MCG/ACT AERO INHALE 2 PUFFS BY MOUTH EVERY MORNING AND INHALE TWO PUFFS BY MOUTH EVERY NIGHT AT BEDTIME 01/29/22   Ladell Pier, MD  nitroGLYCERIN (NITROSTAT) 0.4 MG SL tablet Place 1 tablet (0.4 mg total) under the tongue every 5 (five) minutes as needed for chest pain. 11/22/21   Ladell Pier, MD  omeprazole (PRILOSEC) 20 MG capsule Take 1 capsule (20 mg total) by mouth daily as needed (acid reflux). 05/01/21 09/18/21  Fenton Foy, NP  PARoxetine (PAXIL) 20 MG tablet Take 1 tablet (20 mg total) by mouth daily. 11/22/21   Ladell Pier, MD  potassium chloride SA (KLOR-CON M20) 20 MEQ tablet Take 1 tablet (20 mEq total) by mouth daily. 11/22/21   Ladell Pier, MD  predniSONE (DELTASONE) 50 MG tablet Pt to take 50 mg of prednisone on 11/05/21 at 7:30 PM, 50 mg of prednisone on 11/06/21 at 1:30 AM, and 50 mg of prednisone on 11/06/21 at 7:30 AM. Pt is also to take 50 mg of benadryl on 11/06/21 at 7:30 AM. Please call [phone] with any questions. Patient not taking: Reported on 01/29/2022 10/31/21   Nelson Chimes, MD  Semaglutide, 1 MG/DOSE, 4 MG/3ML SOPN Inject 1 mg as directed once a week.  02/04/22   Ladell Pier, MD  spironolactone (ALDACTONE) 25 MG tablet Take 1 tablet (25 mg total) by mouth daily. 01/07/22   Ladell Pier, MD  valsartan (DIOVAN) 160 MG tablet Take 1 tablet (160 mg total) by mouth daily. 02/14/21   Leonie Man, MD  vitamin B-12 (CYANOCOBALAMIN) 1000 MCG tablet Take 1,000 mcg by mouth daily.    [provider]      Allergies    Ace inhibitors, Aspirin, Hydromorphone, Vancomycin, Contrast media [iodinated contrast media], Dilaudid [hydromorphone hcl], and Lidocaine    Review of Systems   Review of Systems  Constitutional:  Negative for chills, diaphoresis, fatigue and fever.  HENT:  Negative for congestion, rhinorrhea and sneezing.  Eyes: Negative.   Respiratory:  Negative for cough, chest tightness and shortness of breath.   Cardiovascular:  Positive for leg swelling. Negative for chest pain.  Gastrointestinal:  Negative for abdominal pain, blood in stool, diarrhea, nausea and vomiting.  Genitourinary:  Negative for difficulty urinating, flank pain, frequency and hematuria.  Musculoskeletal:  Negative for arthralgias and back pain.  Skin:  Negative for rash.  Neurological:  Positive for light-headedness. Negative for dizziness, speech difficulty, weakness, numbness and headaches.    Physical Exam Updated Vital Signs BP 130/88   Pulse 98   Temp 97.8 F (36.6 C) (Oral)   Resp 19   Ht 5' 2" (1.575 m)   Wt (!) 146.7 kg   SpO2 100%   BMI 59.15 kg/m  Physical Exam Constitutional:      Appearance: She is well-developed.  HENT:     Head: Normocephalic and atraumatic.  Eyes:     Pupils: Pupils are equal, round, and reactive to light.  Cardiovascular:     Rate and Rhythm: Normal rate and regular rhythm.     Heart sounds: Normal heart sounds.  Pulmonary:     Effort: Pulmonary effort is normal. No respiratory distress.     Breath sounds: Normal breath sounds. No wheezing or rales.  Chest:     Chest wall: No tenderness.   Abdominal:     General: Bowel sounds are normal.     Palpations: Abdomen is soft.     Tenderness: There is no abdominal tenderness. There is no guarding or rebound.  Musculoskeletal:        General: Normal range of motion.     Cervical back: Normal range of motion and neck supple.     Comments: 2-3+ pitting edema to lower extremities bilaterally.  There is some mild warmth of the lower extremities bilaterally.  No erythema.  Pedal pulses are intact.  Lymphadenopathy:     Cervical: No cervical adenopathy.  Skin:    General: Skin is warm and dry.     Findings: No rash.  Neurological:     Mental Status: She is alert and oriented to person, place, and time.     ED Results / Procedures / Treatments   Labs (all labs ordered are listed, but only abnormal results are displayed) Labs Reviewed  BASIC METABOLIC PANEL - Abnormal; Notable for the following components:      Result Value   Potassium 3.4 (*)    All other components within normal limits  CBC - Abnormal; Notable for the following components:   Hemoglobin 11.1 (*)    MCHC 29.8 (*)    Platelets 134 (*)    All other components within normal limits  HCG, SERUM, QUALITATIVE  BRAIN NATRIURETIC PEPTIDE    EKG EKG Interpretation  Date/Time:  Thursday February 07 2022 13:53:46 EDT Ventricular Rate:  94 PR Interval:  156 QRS Duration: 98 QT Interval:  370 QTC Calculation: 462 R Axis:   -24 Text Interpretation: Normal sinus rhythm Normal ECG since last tracing no significant change Confirmed by Malvin Johns [phone]) on 02/07/2022 5:13:59 PM  Radiology DG Chest 2 View  Result Date: 02/07/2022 CLINICAL DATA:  Bilateral leg swelling for 5-6 days. EXAM: CHEST - 2 VIEW COMPARISON:  Chest radiograph dated 05/25/2021. FINDINGS: The heart size and mediastinal contours are within normal limits. Vascular calcifications are seen in the aortic arch. Both lungs are clear. The visualized skeletal structures are unremarkable. IMPRESSION: No  active cardiopulmonary disease. Aortic Atherosclerosis (ICD10-I70.0). Electronically Signed  By: Zerita Boers M.D.   On: 02/07/2022 18:05   US Venous Img Lower Bilateral  Result Date: 02/07/2022 CLINICAL DATA:  Bilateral lower extremity edema. History of previous DVT, on anticoagulation. Evaluate for acute or chronic DVT. EXAM: BILATERAL LOWER EXTREMITY VENOUS DOPPLER ULTRASOUND TECHNIQUE: Gray-scale sonography with graded compression, as well as color Doppler and duplex ultrasound were performed to evaluate the lower extremity deep venous systems from the level of the common femoral vein and including the common femoral, femoral, profunda femoral, popliteal and calf veins including the posterior tibial, peroneal and gastrocnemius veins when visible. The superficial great saphenous vein was also interrogated. Spectral Doppler was utilized to evaluate flow at rest and with distal augmentation maneuvers in the common femoral, femoral and popliteal veins. COMPARISON:  None Available. FINDINGS: Examination is degraded due to patient body habitus and poor sonographic window RIGHT LOWER EXTREMITY Common Femoral Vein: No evidence of thrombus. Normal compressibility, respiratory phasicity and response to augmentation. Saphenofemoral Junction: No evidence of thrombus. Normal compressibility and flow on color Doppler imaging. Profunda Femoral Vein: No evidence of thrombus. Normal compressibility and flow on color Doppler imaging. Femoral Vein: No evidence of thrombus. Normal compressibility, respiratory phasicity and response to augmentation. Popliteal Vein: No evidence of thrombus. Normal compressibility, respiratory phasicity and response to augmentation. Calf Veins: No evidence of thrombus. Normal compressibility and flow on color Doppler imaging. Superficial Great Saphenous Vein: No evidence of thrombus. Normal compressibility. Other Findings:  None. LEFT LOWER EXTREMITY Common Femoral Vein: No evidence of  thrombus. Normal compressibility, respiratory phasicity and response to augmentation. Saphenofemoral Junction: No evidence of thrombus. Normal compressibility and flow on color Doppler imaging. Profunda Femoral Vein: No evidence of thrombus. Normal compressibility and flow on color Doppler imaging. Femoral Vein: No evidence of thrombus. Normal compressibility, respiratory phasicity and response to augmentation. Popliteal Vein: No evidence of thrombus. Normal compressibility, respiratory phasicity and response to augmentation. Calf Veins: No evidence of thrombus. Normal compressibility and flow on color Doppler imaging. Superficial Great Saphenous Vein: No evidence of thrombus. Normal compressibility. Other Findings:  None. IMPRESSION: No evidence of acute or chronic DVT within either lower extremity. Electronically Signed   By: Sandi Mariscal M.D.   On: 02/07/2022 15:08    Procedures Procedures    Medications Ordered in ED Medications  furosemide (LASIX) injection 80 mg (80 mg Intravenous Given 02/07/22 1759)  acetaminophen (TYLENOL) tablet 1,000 mg (1,000 mg Oral Given 02/07/22 1923)    ED Course/ Medical Decision Making/ A&P                           Medical Decision Making Amount and/or Complexity of Data Reviewed Labs: ordered. Radiology: ordered.  Risk OTC drugs. Prescription drug management.   Patient is a 51 year old female who presents with bilateral leg swelling.  She does have a history of chronic venous stasis but says her swelling is worse than baseline.  She had ultrasound does not show any sign of DVTs.  There is no clinical suggestions of infection.  There is mild warmth but no erythema.  No fevers.  Her labs are nonconcerning.  She had a chest x-ray which shows no evidence of pulmonary edema.  This was interpreted by me and confirmed by the radiologist.  She does not have any suggestions of ischemic heart disease.  Her EKG does not show any ischemia.  Her troponin is negative.   Her BNP is normal.  She was given 1 dose of  IV Lasix.  She has had a large amount of diuresis following this.  At this point I do not feel that she needs inpatient hospitalization.  She was advised to keep her legs elevated is much as possible and continue her home medications.  She was encouraged to have close follow-up with her PCP.  She was discharged home in good condition.  Return precautions were given.  Final Clinical Impression(s) / ED Diagnoses Final diagnoses:  Peripheral edema    Rx / DC Orders ED Discharge Orders     None         Malvin Johns, MD 02/07/22 1940

## 2022-02-07 NOTE — ED Notes (Signed)
Reviewed AVS/discharge instruction with patient. Time allotted for and all questions answered. Patient is agreeable for d/c and escorted to ed exit by staff.  

## 2022-02-08 ENCOUNTER — Other Ambulatory Visit: Payer: Self-pay | Admitting: Internal Medicine

## 2022-02-08 DIAGNOSIS — I5032 Chronic diastolic (congestive) heart failure: Secondary | ICD-10-CM

## 2022-02-08 DIAGNOSIS — E1169 Type 2 diabetes mellitus with other specified complication: Secondary | ICD-10-CM

## 2022-02-11 NOTE — Telephone Encounter (Signed)
Requested Prescriptions  Pending Prescriptions Disp Refills  . FARXIGA 10 MG TABS tablet [Pharmacy Med Name: FARXIGA 10 MG TABLET] 90 tablet 0    Sig: TAKE 1 TABLET BY MOUTH DAILY     Endocrinology:  Diabetes - SGLT2 Inhibitors Passed - 02/08/2022  6:54 PM      Passed - Cr in normal range and within 360 days    Creatinine  Date Value Ref Range Status  09/20/2021 0.86 0.44 - 1.00 mg/dL Final   Creatinine, Ser  Date Value Ref Range Status  02/07/2022 0.66 0.44 - 1.00 mg/dL Final         Passed - HBA1C is between 0 and 7.9 and within 180 days    HbA1c, POC (prediabetic range)  Date Value Ref Range Status  04/07/2020 5.7 5.7 - 6.4 % Final   HbA1c, POC (controlled diabetic range)  Date Value Ref Range Status  11/22/2021 6.4 0.0 - 7.0 % Final         Passed - eGFR in normal range and within 360 days    GFR calc Af Amer  Date Value Ref Range Status  01/20/2020 >60 >60 mL/min Final   GFR, Estimated  Date Value Ref Range Status  02/07/2022 >60 >60 mL/min Final    Comment:    (NOTE) Calculated using the CKD-EPI Creatinine Equation (2021)   09/20/2021 >60 >60 mL/min Final    Comment:    (NOTE) Calculated using the CKD-EPI Creatinine Equation (2021)    eGFR  Date Value Ref Range Status  01/03/2022 87 >59 mL/min/1.73 Final         Passed - Valid encounter within last 6 months    Recent Outpatient Visits          1 week ago Type 2 diabetes mellitus with morbid obesity ([place])   [place], Jarome Matin, RPH-CPP   1 month ago [place], Deborah B, MD   2 months ago Type 2 diabetes mellitus with morbid obesity [facility])   [place], Deborah B, MD   4 months ago Medication refill   [place], MD   9 months ago Chronic bilateral thoracic back pain   [facility], Kriste Basque, NP       Future Appointments            In 3 weeks Verlee Monte, Hortencia Conradi, MD [facility]   In 4 weeks Daisy Blossom, Jarome Matin, [place]   In 2 months [name], Dalbert Batman, MD [place]

## 2022-02-17 ENCOUNTER — Encounter: Payer: Self-pay | Admitting: Cardiology

## 2022-02-17 DIAGNOSIS — E1169 Type 2 diabetes mellitus with other specified complication: Secondary | ICD-10-CM | POA: Insufficient documentation

## 2022-02-17 DIAGNOSIS — E785 Hyperlipidemia, unspecified: Secondary | ICD-10-CM | POA: Insufficient documentation

## 2022-02-17 NOTE — Assessment & Plan Note (Signed)
Bilateral.  She supposedly has a lymphedema pump.  Seen by vascular surgery.  Recommended Unaboot or wrap; foot elevation This is not usually very well managed by oral diuretic, but continue on diuretic for volume control.

## 2022-02-17 NOTE — Assessment & Plan Note (Signed)
Morbidly obese but has lost weight now since starting Ozempic.

## 2022-02-17 NOTE — Assessment & Plan Note (Signed)
Borderline diagnosis of what is probably hypertensive heart disease.  I also suspect this is component of OHS involved with her need for CPAP.  Plan:   Continue current meds-she is only able to tolerate 2.5 mg amlodipine along with carvedilol 25 mg daily, valsartan 160 mg daily, spironolactone 25 mg daily.  She takes [place] and daily 80 mg Lasix.  Not requiring any additional doses but we discussed sliding scale.

## 2022-02-17 NOTE — Assessment & Plan Note (Addendum)
Chronic DVT-no recurrent DVT pulse studies but she is at risk for venous stasis ulcers.  Clearly has component of lymphedema.  Recommend foot elevation, support stockings as well as diuretics. Continue long-term apixaban.

## 2022-02-17 NOTE — Assessment & Plan Note (Signed)
Follow-up with Dr. Irene Pap.  Concern for there being a evidence of OHS

## 2022-02-17 NOTE — Assessment & Plan Note (Signed)
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  On stable regimen.  With her being on apixaban, not to be on aspirin.  She is otherwise on beta-blocker, and calcium channel blocker, ARB, statin.  She is also on GLP-1 agonist and SGLT2 inhibitor.  In the absence of symptoms, will hold ischemic evaluation.  Would be nice to eventually get a Coronary CTA on her if symptoms warrant.

## 2022-02-17 NOTE — Assessment & Plan Note (Addendum)
Followed by hematology.  No signs of bleeding on Eliquis.  Intermittent iron infusions versus transfusion.

## 2022-02-17 NOTE — Assessment & Plan Note (Signed)
She is now on atorvastatin.  Tolerating well.  Most recent lipids showed LDL of 104.  Follow-up closely as we may need to be more aggressive based simply on her risk factors.  This could be an indication for checking Coronary Calcium Score Coronary CTA.

## 2022-02-19 ENCOUNTER — Other Ambulatory Visit: Payer: Self-pay | Admitting: Internal Medicine

## 2022-02-19 DIAGNOSIS — I152 Hypertension secondary to endocrine disorders: Secondary | ICD-10-CM

## 2022-02-25 ENCOUNTER — Encounter (INDEPENDENT_AMBULATORY_CARE_PROVIDER_SITE_OTHER): Payer: Self-pay

## 2022-02-27 ENCOUNTER — Other Ambulatory Visit: Payer: Medicaid Other | Admitting: Obstetrics and Gynecology

## 2022-02-27 ENCOUNTER — Other Ambulatory Visit: Payer: Self-pay | Admitting: Obstetrics and Gynecology

## 2022-02-27 NOTE — Patient Outreach (Signed)
Care Coordination  02/27/2022  [name] 1971/01/18 [id number]   Medicaid Managed Care   Unsuccessful Outreach Note  02/27/2022 Name: [name] MRN: [id number] DOB: January 14, 1971  Referred by: Ladell Pier, MD Reason for referral : High Risk Managed Medicaid (Unsuccessful telephone outreach)   A second unsuccessful telephone outreach was attempted today. The patient was referred to the case management team for assistance with care management and care coordination.   Follow Up Plan: The care management team will reach out to the patient again over the next 30 business  days.   [name], BSN [place]  [facility] - Managed Medicaid High Risk [phone]

## 2022-02-27 NOTE — Patient Instructions (Signed)
Hi Ms. Haggard, sorry to have missed you today, I hope you and your Mother are doing okay- as a part of your Medicaid benefit, you are eligible for care management and care coordination services at no cost or copay. I was unable to reach you by phone today but would be happy to help you with your health related needs. Please feel free to call me at [phone]  A member of the Managed Medicaid care management team will reach out to you again over the next 30 business  days.   [name], BSN [place]  [facility] - Managed Medicaid High Risk [phone]

## 2022-02-27 NOTE — Patient Instructions (Signed)
Hi [name], thanks for calling me back.  [name] was given information about Medicaid Managed Care team care coordination services as a part of their [place] Medicaid benefit. [name] verbally consented to engagement with the [facility] Managed Care team.   If you are experiencing a medical emergency, please call 911 or report to your local emergency department or urgent care.   If you have a non-emergency medical problem during routine business hours, please contact your provider's office and ask to speak with a nurse.   For questions related to your [facility], please call: [phone] or visit the homepage here: [email or web address]  If you would like to schedule transportation through your [facility], please call the following number at least 2 days in advance of your appointment: [phone]   Rides for urgent appointments can also be made after hours by calling Member Services.  Call the [place] at [phone], at any time, 24 hours a day, 7 days a week. If you are in danger or need immediate medical attention call 911.  If you would like help to quit smoking, call 1-800-QUIT-NOW (782) 345-6724) OR Espaol: 1-855-Djelo-Ya (3-500-938-1829) o para ms informacin haga clic aqu or Text READY to 200-400 to register via text  [name] - following are the goals we discussed in your visit today:   Goals Addressed    Timeframe:  Long-Range Goal Priority:  High Start Date:     07/02/21                        Expected End Date:     ongoing                  Follow Up Date 04/03/22 - schedule appointment for flu shot - schedule appointment for vaccines needed due to my age or health - schedule recommended health tests (blood work, mammogram, colonoscopy, pap test) - schedule and keep appointment for annual check-up     Why is this important?   Screening tests can find diseases early when they are easier to treat.  Your doctor or nurse will talk with you about which tests are important for you.  Getting shots for common diseases like the flu and shingles will help prevent them.   02/27/22:  Seen and evaluated by CARDS 10/3 and Nutrition 10/9.  Has PULM appt. 11/8  Patient verbalizes understanding of instructions and care plan provided today and agrees to view in [place]. Active MyChart status and patient understanding of how to access instructions and care plan via MyChart confirmed with patient.     The Managed Medicaid care management team will reach out to the patient again over the next 30 business  days.  The  Patient has been provided with contact information for the Managed Medicaid care management team and has been advised to call with any health related questions or concerns.   [name], BSN [place]  [facility] [employer] - Managed Medicaid High Risk [phone].   Following is a copy of your plan of care:  Care Plan : [place] of Care  Updates made by [name], RN since 02/27/2022 12:00 AM     Problem: Chronic Disease Management and Care Coordination Needs   Priority: High     Long-Range Goal: Self-Management Plan Developed   Start Date: 07/02/2021  Expected End Date: 05/30/2022  Recent Progress: Not on track  Priority: High  Note:   Current Barriers:  Knowledge Deficits related to plan of care for management of back pain. lymphedema Care Coordination needs related to Limited support and back pain Chronic Disease Management support and education needs related to back pain. Lymphedema  02/27/22:  patient with swelling lower extremities, h/o chronic ulcer LLE-has appt for evaluation.  RNCM Clinical Goal(s):  Patient will verbalize understanding of plan for management of back pain, lymphedema  as evidenced by patient report and  attending PT sessions take all medications exactly as prescribed and will call provider for medication related questions as evidenced by patient report attend all scheduled medical appointments as evidenced by patient report continue to work with RN Care Manager to address care management and care coordination needs related to  back pain, lymphedema as evidenced by adherence to CM Team Scheduled appointments work with social worker to address  anxiety.  Limited support and hisory of anxiety/depression related to the management of back pain  as evidenced by review of EMR and patient or social worker report through collaboration with Consulting civil engineer, provider, and care team.   Interventions: Inter-disciplinary care team collaboration (see longitudinal plan of care) Evaluation of current treatment plan related to  self management and patient's adherence to plan as established by provider Collaborated with [facility] to schedule an appt for her Collaborated with SW for anxiety SW referral for anxiety-completed.  Pain Interventions:  (Status:  New goal.) Long Term Goal Pain assessment performed Medications reviewed Reviewed provider established plan for pain management Discussed importance of adherence to all scheduled medical appointments Counseled on the importance of reporting any/all new or changed pain symptoms or management strategies to pain management provider Advised patient to report to care team affect of pain on daily activities Discussed use of relaxation techniques and/or diversional activities to assist with pain reduction (distraction, imagery, relaxation, massage, acupressure, TENS, heat, and cold application Reviewed with patient prescribed pharmacological and nonpharmacological pain relief strategies Assessed social determinant of health barriers  Patient Goals/Self-Care Activities: Take all medications as prescribed Attend all scheduled provider  appointments Call pharmacy for medication refills 3-7 days in advance of running out of medications Perform all self care activities independently  Perform IADL's (shopping, preparing meals, housekeeping, managing finances) independently Call provider office for new concerns or questions  Work with the social worker to address care coordination needs and will continue to work with the clinical team to address health care and disease management related needs  Follow Up Plan:  The patient has been provided with contact information for the care management team and has been advised to call with any health related questions or concerns.  The care management team will reach out to the patient again over the next 30 business days.

## 2022-02-27 NOTE — Patient Outreach (Signed)
Medicaid Managed Care   Nurse Care Manager Note  02/27/2022 Name:  [name] MRN:  [id number] DOB:  1971/03/19  [name] is an 51 y.o. year old female who is a primary patient of Ladell Pier, MD.  The [facility] Managed Care Coordination team was consulted for assistance with:    Chronic healthcare management needs, back pain, lymphedema, asthma, HTN, CHF, CAD, OSA, anxiety, DM  [name] was given information about Medicaid Managed Care Coordination team services today. [place] Patient agreed to services and verbal consent obtained.  Engaged with patient by telephone for follow up visit in response to provider referral for case management and/or care coordination services.   Assessments/Interventions:  Review of past medical history, allergies, medications, health status, including review of consultants reports, laboratory and other test data, was performed as part of comprehensive evaluation and provision of chronic care management services.  SDOH (Social Determinants of Health) assessments and interventions performed: SDOH Interventions    Flowsheet Row Patient Outreach Telephone from 02/27/2022 in [place] Patient Outreach Telephone from 12/14/2021 in [place] Patient Outreach Telephone from 10/10/2021 in [place] Patient Outreach Telephone from 09/06/2021 in [place] Patient Outreach Telephone from 08/21/2021 in [place] Patient Outreach Telephone from 08/02/2021 in [place] Coordination  SDOH Interventions        Food Insecurity Interventions -- -- Intervention Not Indicated -- -- --  Housing Interventions -- Intervention Not Indicated -- -- -- Intervention Not Indicated  Transportation Interventions  -- -- Intervention Not Indicated -- -- --  Utilities Interventions Intervention Not Indicated -- -- -- -- --  Financial Strain Interventions -- -- -- Intervention Not Indicated -- --  Physical Activity Interventions -- Intervention Not Indicated -- -- -- Intervention Not Indicated  [not physically able to]  Stress Interventions Other (Comment)  [patient goes to therapy] -- -- -- [employer], Provide Counseling --     Care Plan  Allergies  Allergen Reactions   Ace Inhibitors Rash and Other (See Comments)    Make pt bleed   Aspirin Other (See Comments)    Per patient paperwork: blood clot?  Likely because of chronic DOAC   Hydromorphone Hives and Itching   Vancomycin Itching and Rash   Contrast Media [Iodinated Contrast Media] Hives   Dilaudid [Hydromorphone Hcl] Hives   Lidocaine Itching    Itching with Lidocaine patch reported 06/13/2021 vis telephone message.    Medications Reviewed Today     Reviewed by [name], RN (Registered Nurse) on 02/27/22 at 63  Med List Status: <None>   Medication Order Taking? Sig Documenting Provider Last Dose Status Informant  Accu-Chek Softclix Lancets lancets [id number] No Use as instructed  Patient taking differently: 1 each by Other route See admin instructions. Use as instructed   Argentina Donovan, PA-C Taking Active Self  acetaminophen (TYLENOL) 500 MG tablet [id number] No Take 1 tablet (500 mg total) by mouth every 6 (six) hours as needed.  Patient taking differently: Take 500 mg by mouth every 6 (six) hours as needed for mild pain.   Ladell Pier, MD Taking Active Self  acetaminophen (TYLENOL) 650 MG CR tablet [id number] No  [provider] Taking Active   albuterol (PROAIR HFA) 108 (90 Base) MCG/ACT inhaler [id number] No INHALE TWO PUFFS BY MOUTH EVERY 6 HOURS AS NEEDED  FOR WHEEZING OR SHORTNESS OF BREATH  Patient taking differently: Inhale 2 puffs into the lungs See admin instructions. INHALE TWO  PUFFS BY MOUTH EVERY 6 HOURS AS NEEDED FOR WHEEZING OR SHORTNESS OF Brennan Bailey, MD Taking Active Self  amLODipine (NORVASC) 5 MG tablet [id number]  TAKE 1/2 TABLET BY MOUTH DAILY Ladell Pier, MD  Active   apixaban (ELIQUIS) 5 MG TABS tablet [id number] No Take 1 tablet (5 mg total) by mouth 2 (two) times daily. Ladell Pier, MD Taking Active   atorvastatin (LIPITOR) 40 MG tablet [id number] No Take 1 tablet (40 mg total) by mouth daily. Ladell Pier, MD Taking Active   Blood Glucose Calibration (ACCU-CHEK GUIDE CONTROL) LIQD [id number] No 1 each by In Vitro route daily. Argentina Donovan, PA-C Taking Active Self  Blood Glucose Monitoring Suppl (ACCU-CHEK GUIDE) w/Device KIT [id number] No 1 each by Does not apply route 2 (two) times daily. Argentina Donovan, PA-C Taking Active Self  busPIRone (BUSPAR) 7.5 MG tablet [id number] No Take 1 tablet by mouth 2 (two) times daily. [provider] Taking Active   Carboxymethylcellulose Sodium (EYE DROPS OP) [id number] No Place 1 drop into both eyes daily as needed (dry eyes). [provider] Taking Active Self  carvedilol (COREG) 25 MG tablet [id number] No Take 1 tablet (25 mg total) by mouth 2 (two) times daily with a meal. Ladell Pier, MD Taking Active   cholecalciferol (VITAMIN D3) 25 MCG (1000 UNIT) tablet [id number] No Take 1 tablet (1,000 Units total) by mouth daily. Ladell Pier, MD Taking Active Self  COVID-19 mRNA Vac-TriS, Pfizer, (PFIZER-BIONT COVID-19 VAC-TRIS) SUSP injection [id number] No Inject into the muscle. Carlyle Basques, MD Taking Active   diphenhydrAMINE (BENADRYL) 50 MG tablet [id number]  Take 1 tablet (50 mg total) by mouth once for 1 dose. Pt to take 50 mg of benadryl on 11/06/21 at 7:30 AM. Please call [phone] with any questions. Nelson Chimes, MD  Expired 10/31/21 2359   FARXIGA 10 MG TABS tablet [id number]  TAKE 1 TABLET BY MOUTH DAILY Ladell Pier, MD  Active   FEROSUL 325  (65 Fe) MG tablet [id number] No TAKE ONE TABLET BY MOUTH DAILY WITH BREAKFAST Ladell Pier, MD Taking Active   furosemide (LASIX) 80 MG tablet [id number] No Take 1 tablet (80 mg total) by mouth daily. Take additional dose of lasix for weight gain, leg swelling Barb Merino, MD Taking Active   gabapentin (NEURONTIN) 100 MG capsule [id number] No Take 1 capsule (100 mg total) by mouth 3 (three) times daily. Ladell Pier, MD Taking Active   glucose blood test strip [id number] No Use as instructed  Patient taking differently: 1 each by Other route See admin instructions. Use as instructed   Argentina Donovan, PA-C Taking Active Self  hydrocortisone cream 1 % [id number] No Apply 1 application topically daily as needed for itching. [provider] Taking Active Self  hydrOXYzine (VISTARIL) 25 MG capsule [id number] No Take 1 capsule (25 mg total) by mouth at bedtime as needed.  Patient taking differently: Take 25 mg by mouth at bedtime as needed for anxiety or itching.   Ladell Pier, MD Taking Active Self  methocarbamol (ROBAXIN) 500 MG tablet [id number] No Take 1 tablet (500 mg total) by mouth 2 (two) times daily as needed for muscle spasms. Ladell Pier, MD Taking Active   mometasone-formoterol [facility]) 200-5 MCG/ACT AERO [id number]  INHALE 2 PUFFS BY MOUTH EVERY MORNING AND  INHALE TWO PUFFS BY MOUTH EVERY NIGHT AT BEDTIME Ladell Pier, MD  Active   nitroGLYCERIN (NITROSTAT) 0.4 MG SL tablet [id number] No Place 1 tablet (0.4 mg total) under the tongue every 5 (five) minutes as needed for chest pain. Ladell Pier, MD Taking Active   omeprazole (PRILOSEC) 20 MG capsule [id number] No Take 1 capsule (20 mg total) by mouth daily as needed (acid reflux). Fenton Foy, NP Taking Expired 09/18/21 2359 Self  PARoxetine (PAXIL) 20 MG tablet [id number] No Take 1 tablet (20 mg total) by mouth daily. Ladell Pier, MD Taking Active   potassium chloride SA (KLOR-CON M20) 20  MEQ tablet [id number] No Take 1 tablet (20 mEq total) by mouth daily. Ladell Pier, MD Taking Active   predniSONE (DELTASONE) 50 MG tablet [id number] No Pt to take 50 mg of prednisone on 11/05/21 at 7:30 PM, 50 mg of prednisone on 11/06/21 at 1:30 AM, and 50 mg of prednisone on 11/06/21 at 7:30 AM. Pt is also to take 50 mg of benadryl on 11/06/21 at 7:30 AM. Please call [phone] with any questions.  Patient not taking: Reported on 01/29/2022   Nelson Chimes, MD Not Taking Active   Semaglutide, 1 MG/DOSE, 4 MG/3ML SOPN [id number]  Inject 1 mg as directed once a week. Ladell Pier, MD  Active   spironolactone (ALDACTONE) 25 MG tablet [id number] No Take 1 tablet (25 mg total) by mouth daily. Ladell Pier, MD Taking Active   valsartan (DIOVAN) 160 MG tablet [id number] No Take 1 tablet (160 mg total) by mouth daily. Leonie Man, MD Taking Active Self  vitamin B-12 (CYANOCOBALAMIN) 1000 MCG tablet [id number] No Take 1,000 mcg by mouth daily. [provider] Taking Active Self           Patient Active Problem List   Diagnosis Date Noted   Hyperlipidemia with target LDL less than 100 02/17/2022   Pain in thoracic spine 09/25/2021   Low back pain 09/25/2021   OSA (obstructive sleep apnea) 07/11/2021   Depression 05/28/2021   Encounter for care related to Port-a-Cath 05/27/2021   Acute on chronic respiratory failure with hypoxia ([place]) 05/26/2021   Morbid obesity with BMI of 50.0-59.9, adult ([place]) 05/26/2021   Acute respiratory failure ([place]) 11/23/2020   Acute respiratory failure with hypoxia ([place]) 11/22/2020   Fever 11/22/2020   Bilateral lower extremity edema 11/22/2020   History of DVT (deep vein thrombosis) 11/22/2020   Asthma, chronic, moderate persistent, with acute exacerbation 11/22/2020   Lower limb ulcer, calf, left, limited to breakdown of skin ([place]) 06/09/2020   Lymphedema 06/09/2020   Iron deficiency anemia due to chronic blood loss 05/10/2020   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 ([place]) 04/13/2020   Venous insufficiency 04/13/2020   S/P insertion of IVC (inferior vena caval) filter 04/13/2020   Chronic anemia 04/13/2020   Essential hypertension 04/13/2020   (HFpEF) heart failure with preserved ejection fraction (HCC) -> although echo suggests normal diastolic parameters with normal left atrial size 04/13/2020   CAD (coronary artery disease) 11/17/2019   Conditions to be addressed/monitored per PCP order:  Chronic healthcare management needs, back pain, lymphedema, asthma, HTN, CHF, CAD, OSA, anxiety, DM  Care Plan : RN Care Manager Plan of Care  Updates made by [name], RN since 02/27/2022 12:00 AM     Problem: Chronic Disease Management and Care Coordination Needs   Priority: High     Long-Range  Goal: Self-Management Plan Developed   Start Date: 07/02/2021  Expected End Date: 05/30/2022  Recent Progress: Not on track  Priority: High  Note:   Current Barriers:  Knowledge Deficits related to plan of care for management of back pain. lymphedema Care Coordination needs related to Limited support and back pain Chronic Disease Management support and education needs related to back pain. Lymphedema  02/27/22:  patient with swelling lower extremities, h/o chronic ulcer LLE-has appt for evaluation.  RNCM Clinical Goal(s):  Patient will verbalize understanding of plan for management of back pain, lymphedema  as evidenced by patient report and attending PT sessions take all medications exactly as prescribed and will call provider for medication related questions as evidenced by patient report attend all scheduled medical appointments as evidenced by patient report continue to work with RN Care Manager to address care management and care coordination needs related to  back pain, lymphedema as evidenced by adherence to CM Team Scheduled appointments work with social worker to address  anxiety.  Limited  support and hisory of anxiety/depression related to the management of back pain  as evidenced by review of EMR and patient or social worker report through collaboration with Consulting civil engineer, provider, and care team.   Interventions: Inter-disciplinary care team collaboration (see longitudinal plan of care) Evaluation of current treatment plan related to  self management and patient's adherence to plan as established by provider Collaborated with [facility] to schedule an appt for her Collaborated with SW for anxiety SW referral for anxiety-completed.  Pain Interventions:  (Status:  New goal.) Long Term Goal Pain assessment performed Medications reviewed Reviewed provider established plan for pain management Discussed importance of adherence to all scheduled medical appointments Counseled on the importance of reporting any/all new or changed pain symptoms or management strategies to pain management provider Advised patient to report to care team affect of pain on daily activities Discussed use of relaxation techniques and/or diversional activities to assist with pain reduction (distraction, imagery, relaxation, massage, acupressure, TENS, heat, and cold application Reviewed with patient prescribed pharmacological and nonpharmacological pain relief strategies Assessed social determinant of health barriers  Patient Goals/Self-Care Activities: Take all medications as prescribed Attend all scheduled provider appointments Call pharmacy for medication refills 3-7 days in advance of running out of medications Perform all self care activities independently  Perform IADL's (shopping, preparing meals, housekeeping, managing finances) independently Call provider office for new concerns or questions  Work with the social worker to address care coordination needs and will continue to work with the clinical team to address health care and disease management related needs  Follow Up Plan:   The patient has been provided with contact information for the care management team and has been advised to call with any health related questions or concerns.  The care management team will reach out to the patient again over the next 30 business days.    Long-Range Goal: Establish Plan of Care for Chronic Disease Management Needs   Priority: High  Note:   Timeframe:  Long-Range Goal Priority:  High Start Date:     07/02/21                        Expected End Date:     ongoing                  Follow Up Date 04/03/22 - schedule appointment for flu shot - schedule appointment for vaccines needed due to my  age or health - schedule recommended health tests (blood work, mammogram, colonoscopy, pap test) - schedule and keep appointment for annual check-up    Why is this important?   Screening tests can find diseases early when they are easier to treat.  Your doctor or nurse will talk with you about which tests are important for you.  Getting shots for common diseases like the flu and shingles will help prevent them.   02/27/22:  Seen and evaluated by CARDS 10/3 and Nutrition 10/9.  Has PULM appt. 11/8   Follow Up:  Patient agrees to Care Plan and Follow-up.  Plan: The Managed Medicaid care management team will reach out to the patient again over the next 30 business  days. and The  Patient has been provided with contact information for the Managed Medicaid care management team and has been advised to call with any health related questions or concerns.  Date/time of next scheduled RN care management/care coordination outreach: 04/02/22 at 1230.

## 2022-02-28 NOTE — Progress Notes (Deleted)
Synopsis: Referred for asthma by Ladell Pier, MD  Subjective:   PATIENT ID: [name] GENDER: female DOB: February 19, 1971, MRN: [id number]  No chief complaint on file.   51yF with history of recurrent DVT, IDA with port for iron infusions, obesity, diastolic dysfunction, OSA last PSG done years ago in [place] in 2018 and was on CPAP referred for asthma followeing recent hospitalization for acute on chronic diastolic HF exacerbation, possible asthma exacerbation, discharged 8/1.   She says that prior to last hospitalization she had gone to cancer center to get her portacath flushed. She rapidly developed worsening cough when she got home. What she understands of hospitalization is that she had a whole lot of fluid on her heart which they relieved with lasix. She is unsure what made biggest difference with her recovery - whether it was treatment of CHF or her inhalers. She is unsure what her dry weight is. She wonders if stress is a trigger for her asthma. She does not have much of a cough currently. DOE to 10 minutes which has very gradually worsened over last couple of years.   Regarding her OSA her DME supplier had closed up shop by the time she tried to call with issues she was having with her machine. She does have a whole lot of daytime sleepiness. She does not snore. She does not have PND.   Father had emphysema, uncle with stomach cancer, grandmother died of breast cancer  She has 69 pack year smoking history quit in 2005  She is disabled, in past worked at [employer], Art therapist. SHe has two dogs at home, no pet bird.   Interval HPI:  PFT with moderately severe obstruction and reduced FVC, neg BD response  PSG with moderate OSA  Admission for acute on chronic diastolic heart failure in 05/2021  Otherwise pertinent review of systems is negative.    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   Morbid obesity due to excess calories (HCC)    OSA (obstructive sleep apnea) 07/11/2021   Sleep study April 06, 2019 (Dr. Halford Chessman): Moderate OSA (AHI of 24.4 and SPO2 low of 77%).  Majority events during REM sleep.-recommend CPAP, oral appliance or surgical. Reviewed sleep hygiene.  Avoid sedatives.     Osteoarthritis of left knee    Prediabetes    Small bowel obstruction ([place])    as a child   Speech impediment    Stutter / stammer     Family History  Problem Relation Age of Onset   Diabetes Mellitus II Mother    COPD Father    Diabetes Father  Diabetes Mellitus II Maternal Grandmother    Breast cancer Paternal Grandfather      Past Surgical History:  Procedure Laterality Date   ABDOMINAL WALL DEFECT REPAIR  1970   IR CV LINE INJECTION  10/24/2020   IR REMOVAL TUN ACCESS W/ PORT W/O FL MOD SED  05/28/2021   IVC FILTER INSERTION  2017   Lower Extremity Venous Duplex  06/23/2020   No evidence of DVT or superficial thrombosis bilaterally.  No evidence of deep venous insufficiency bilaterally.  No evidence of SSV reflux.  Right GSV in the calf has reflux, no reflux in L GSV.;  Repeated in July 2020-no DVT    [place]  2014   TRANSTHORACIC ECHOCARDIOGRAM  05/18/2021   EF 60 to 65%.  No RWMA.  Mild concentric LVH.  "Normal diastolic parameters ".  Normal longitudinal strain.  Normal PAP, RAP.  Normal aortic and mitral valves.==> In July 2022, echo read as GR 1 DD otherwise stable.    Social History   Socioeconomic History   Marital status: Single    Spouse name: Not on file   Number of children: 0   Years of education: Not on file   Highest education level: 12th grade  Occupational History   Occupation: unemployed on disablity  Tobacco Use   Smoking status: Former   Smokeless tobacco: Never  Scientific laboratory technician Use: Never used  Substance and Sexual Activity   Alcohol use: Not Currently   Drug use: Not Currently   Sexual activity: Not Currently  Other Topics Concern   Not on file  Social History Narrative   Not on file   Social Determinants of Health   Financial Resource Strain: Low Risk  (09/06/2021)   Overall Financial Resource Strain (CARDIA)    Difficulty of Paying Living Expenses: Not very hard  Food Insecurity: No Food Insecurity (10/10/2021)   Hunger Vital Sign    Worried About Running Out of Food in the Last Year: Never true    Ran Out of Food in the Last Year: Never true  Transportation Needs: No Transportation Needs (10/10/2021)   PRAPARE - Hydrologist (Medical): No    Lack of Transportation (Non-Medical): No  Physical Activity: Sufficiently Active (12/14/2021)   Exercise Vital Sign    Days of Exercise per Week: 7 days    Minutes of Exercise per Session: 30 min  Stress: Stress Concern Present (02/27/2022)   [place]    Feeling of Stress : To some extent  Social Connections: Socially Isolated (09/06/2021)   Social Connection and Isolation Panel [NHANES]    Frequency of Communication with Friends and Family: More than  three times a week    Frequency of Social Gatherings with Friends and Family: More than three times a week    Attends Religious Services: Never    Marine scientist or Organizations: No    Attends Archivist Meetings: Never    Marital Status: Never married  Intimate Partner Violence: Not At Risk (11/13/2021)   Humiliation, Afraid, Rape, and Kick questionnaire    Fear of Current or Ex-Partner: No    Emotionally Abused: No    Physically Abused: No    Sexually Abused: No     Allergies  Allergen Reactions   Ace Inhibitors Rash and Other (See Comments)    Make pt bleed  Aspirin Other (See Comments)    Per patient paperwork: blood clot?  Likely because of chronic DOAC   Hydromorphone Hives and Itching   Vancomycin Itching and Rash   Contrast Media [Iodinated Contrast Media] Hives   Dilaudid [Hydromorphone Hcl] Hives   Lidocaine Itching    Itching with Lidocaine patch reported 06/13/2021 vis telephone message.     Outpatient Medications Prior to Visit  Medication Sig Dispense Refill   Accu-Chek Softclix Lancets lancets Use as instructed (Patient taking differently: 1 each by Other route See admin instructions. Use as instructed) 100 each 12   acetaminophen (TYLENOL) 500 MG tablet Take 1 tablet (500 mg total) by mouth every 6 (six) hours as needed. (Patient taking differently: Take 500 mg by mouth every 6 (six) hours as needed for mild pain.) 60 tablet 0   acetaminophen (TYLENOL) 650 MG CR tablet      albuterol (PROAIR HFA) 108 (90 Base) MCG/ACT inhaler INHALE TWO PUFFS BY MOUTH EVERY 6 HOURS AS NEEDED FOR WHEEZING OR SHORTNESS OF BREATH (Patient taking differently: Inhale 2 puffs into the lungs See admin instructions. INHALE TWO PUFFS BY MOUTH EVERY 6 HOURS AS NEEDED FOR WHEEZING OR SHORTNESS OF BREATH) 8.5 g 0   amLODipine (NORVASC) 5 MG tablet TAKE 1/2 TABLET BY MOUTH DAILY 45 tablet 1   apixaban (ELIQUIS) 5 MG TABS tablet Take 1 tablet (5 mg total) by mouth 2 (two) times  daily. 180 tablet 1   atorvastatin (LIPITOR) 40 MG tablet Take 1 tablet (40 mg total) by mouth daily. 90 tablet 1   Blood Glucose Calibration (ACCU-CHEK GUIDE CONTROL) LIQD 1 each by In Vitro route daily. 1 each 4   Blood Glucose Monitoring Suppl (ACCU-CHEK GUIDE) w/Device KIT 1 each by Does not apply route 2 (two) times daily. 1 kit 0   busPIRone (BUSPAR) 7.5 MG tablet Take 1 tablet by mouth 2 (two) times daily.     Carboxymethylcellulose Sodium (EYE DROPS OP) Place 1 drop into both eyes daily as needed (dry eyes).     carvedilol (COREG) 25 MG tablet Take 1 tablet (25 mg total) by mouth 2 (two) times daily with a meal. 60 tablet 3   cholecalciferol (VITAMIN D3) 25 MCG (1000 UNIT) tablet Take 1 tablet (1,000 Units total) by mouth daily. 30 tablet 5   COVID-19 mRNA Vac-TriS, Pfizer, (PFIZER-BIONT COVID-19 VAC-TRIS) SUSP injection Inject into the muscle. 0.3 mL 0   diphenhydrAMINE (BENADRYL) 50 MG tablet Take 1 tablet (50 mg total) by mouth once for 1 dose. Pt to take 50 mg of benadryl on 11/06/21 at 7:30 AM. Please call [phone] with any questions. 1 tablet 0   FARXIGA 10 MG TABS tablet TAKE 1 TABLET BY MOUTH DAILY 90 tablet 0   FEROSUL 325 (65 Fe) MG tablet TAKE ONE TABLET BY MOUTH DAILY WITH BREAKFAST 100 tablet 0   furosemide (LASIX) 80 MG tablet Take 1 tablet (80 mg total) by mouth daily. Take additional dose of lasix for weight gain, leg swelling 30 tablet 4   gabapentin (NEURONTIN) 100 MG capsule Take 1 capsule (100 mg total) by mouth 3 (three) times daily. 90 capsule 3   glucose blood test strip Use as instructed (Patient taking differently: 1 each by Other route See admin instructions. Use as instructed) 100 each 12   hydrocortisone cream 1 % Apply 1 application topically daily as needed for itching.     hydrOXYzine (VISTARIL) 25 MG capsule Take 1 capsule (25 mg total) by mouth at bedtime  as needed. (Patient taking differently: Take 25 mg by mouth at bedtime as needed for anxiety or  itching.) 30 capsule 1   methocarbamol (ROBAXIN) 500 MG tablet Take 1 tablet (500 mg total) by mouth 2 (two) times daily as needed for muscle spasms. 60 tablet 1   mometasone-formoterol (DULERA) 200-5 MCG/ACT AERO INHALE 2 PUFFS BY MOUTH EVERY MORNING AND INHALE TWO PUFFS BY MOUTH EVERY NIGHT AT BEDTIME 13 g 3   nitroGLYCERIN (NITROSTAT) 0.4 MG SL tablet Place 1 tablet (0.4 mg total) under the tongue every 5 (five) minutes as needed for chest pain. 30 tablet 0   omeprazole (PRILOSEC) 20 MG capsule Take 1 capsule (20 mg total) by mouth daily as needed (acid reflux). 30 capsule 0   PARoxetine (PAXIL) 20 MG tablet Take 1 tablet (20 mg total) by mouth daily. 30 tablet 3   potassium chloride SA (KLOR-CON M20) 20 MEQ tablet Take 1 tablet (20 mEq total) by mouth daily. 30 tablet 4   predniSONE (DELTASONE) 50 MG tablet Pt to take 50 mg of prednisone on 11/05/21 at 7:30 PM, 50 mg of prednisone on 11/06/21 at 1:30 AM, and 50 mg of prednisone on 11/06/21 at 7:30 AM. Pt is also to take 50 mg of benadryl on 11/06/21 at 7:30 AM. Please call [phone] with any questions. (Patient not taking: Reported on 01/29/2022) 3 tablet 0   Semaglutide, 1 MG/DOSE, 4 MG/3ML SOPN Inject 1 mg as directed once a week. 3 mL 2   spironolactone (ALDACTONE) 25 MG tablet Take 1 tablet (25 mg total) by mouth daily. 30 tablet 3   valsartan (DIOVAN) 160 MG tablet Take 1 tablet (160 mg total) by mouth daily. 90 tablet 3   vitamin B-12 (CYANOCOBALAMIN) 1000 MCG tablet Take 1,000 mcg by mouth daily.     No facility-administered medications prior to visit.       Objective:   Physical Exam:  General appearance: 51 y.o., female, NAD, conversant  Eyes: anicteric sclerae, moist conjunctivae; no lid-lag; PERRL, tracking appropriately HENT: NCAT; oropharynx, MMM, no mucosal ulcerations; normal hard and soft palate.?Nasal polyp L nare. Neck: Trachea midline; no lymphadenopathy, no JVD Lungs: Faint crackles in bases, no wheeze, with normal  respiratory effort CV: RRR, no MRGs  Abdomen: Soft, non-tender, obese; non-distended, BS present  Extremities: 1+ BLE edema, warm Skin: Normal temperature, turgor and texture; no rash Psych: Appropriate affect Neuro: Alert and oriented to person and place, no focal deficit    There were no vitals filed for this visit.    on RA BMI Readings from Last 3 Encounters:  02/07/22 59.15 kg/m  01/29/22 59.15 kg/m  01/03/22 54.96 kg/m   Wt Readings from Last 3 Encounters:  02/07/22 (!) 323 lb 6.6 oz (146.7 kg)  01/29/22 (!) 323 lb 6.4 oz (146.7 kg)  01/03/22 (!) 340 lb 8 oz (154.4 kg)     CBC    Component Value Date/Time   WBC 4.6 02/07/2022 1348   RBC 4.03 02/07/2022 1348   HGB 11.1 (L) 02/07/2022 1348   HGB 12.3 01/31/2022 1341   HCT 37.3 02/07/2022 1348   HCT 38.8 01/31/2022 1341   PLT 134 (L) 02/07/2022 1348   PLT 109 (L) 01/31/2022 1341   MCV 92.6 02/07/2022 1348   MCV 87 01/31/2022 1341   MCH 27.5 02/07/2022 1348   MCHC 29.8 (L) 02/07/2022 1348   RDW 13.4 02/07/2022 1348   RDW 12.3 01/31/2022 1341   LYMPHSABS 0.9 09/20/2021 1144   LYMPHSABS 0.5 (L)  11/29/2020 1445   MONOABS 0.5 09/20/2021 1144   EOSABS 0.1 09/20/2021 1144   EOSABS 0.0 11/29/2020 1445   BASOSABS 0.0 09/20/2021 1144   BASOSABS 0.0 11/29/2020 1445      Chest Imaging: 11/23/20 CTA Chest reviewed by me with no PE. Prominent azygos which might be related to permcath vs PH. Positive egg and banana sign which could indicate PH. Large heart.  CXR with possible small RLL nodule  Pulmonary Functions Testing Results:    Latest Ref Rng & Units 01/08/2021   11:29 AM  PFT Results  FVC-Pre L 1.66   FVC-Predicted Pre % 60   FVC-Post L 1.62   FVC-Predicted Post % 59   Pre FEV1/FVC % % 66   Post FEV1/FCV % % 72   FEV1-Pre L 1.09   FEV1-Predicted Pre % 49   FEV1-Post L 1.16    Moderately severe obstruction, reduced fvc, neg BD response   PSG 04/05/21: - Moderate obstructive sleep apnea with an AHI  of 24.4 and SpO2 low of 77%.  The majority of events occurred in REM sleep with a REM AHI of 54. - Supplemental oxygen was not used during the study.  Echocardiogram:  TTE1/28/23:  1. Left ventricular ejection fraction, by estimation, is 60 to 65%. The  left ventricle has normal function. The left ventricle has no regional  wall motion abnormalities. There is mild concentric left ventricular  hypertrophy. Left ventricular diastolic  parameters were normal. The average left ventricular global longitudinal  strain is -19.3 %. The global longitudinal strain is normal.   2. Right ventricular systolic function is normal. The right ventricular  size is normal. There is normal pulmonary artery systolic pressure.   3. The mitral valve is grossly normal. No evidence of mitral valve  regurgitation.   4. The aortic valve is grossly normal. Aortic valve regurgitation is not  visualized. No aortic stenosis is present.  Heart Catheterization: None    Assessment & Plan:   # DOE: likely multifactorial with deconditioning, asthma, volume overload/CHF playing roles.   # Uncontrolled persistent asthma:   # OSA: high probability co-morbid OHS. Will likely need in-lab testing given anticipated severity, co-morbid CHF. Was previously on CPAP via oronasal mask that she tolerated well - last tested  Plan: - spiro pre/post BD, instructed to hold dulera for 1-2 days before spirometry unless she has deterioration while holding dulera in which case she can resume - continue dulera 200 2 puffs twice daily, rinse mouth afterward - albuterol prn - vbg - likely plan to order in lab split night PSG with PAP titration +/- TCO2 depending on vbg result      Maryjane Hurter, MD [facility] 02/28/2022 3:29 PM

## 2022-03-05 ENCOUNTER — Encounter (HOSPITAL_COMMUNITY): Payer: Self-pay

## 2022-03-05 ENCOUNTER — Encounter (HOSPITAL_BASED_OUTPATIENT_CLINIC_OR_DEPARTMENT_OTHER): Payer: Medicaid Other | Attending: General Surgery | Admitting: General Surgery

## 2022-03-05 ENCOUNTER — Emergency Department (HOSPITAL_BASED_OUTPATIENT_CLINIC_OR_DEPARTMENT_OTHER): Payer: Medicaid Other

## 2022-03-05 ENCOUNTER — Emergency Department (HOSPITAL_BASED_OUTPATIENT_CLINIC_OR_DEPARTMENT_OTHER): Payer: Medicaid Other | Admitting: Radiology

## 2022-03-05 ENCOUNTER — Other Ambulatory Visit: Payer: Self-pay

## 2022-03-05 ENCOUNTER — Encounter (HOSPITAL_BASED_OUTPATIENT_CLINIC_OR_DEPARTMENT_OTHER): Payer: Self-pay

## 2022-03-05 ENCOUNTER — Inpatient Hospital Stay (HOSPITAL_BASED_OUTPATIENT_CLINIC_OR_DEPARTMENT_OTHER)
Admission: EM | Admit: 2022-03-05 | Discharge: 2022-03-11 | DRG: 191 | Disposition: A | Discharge status: Home / Self Care | Payer: Medicaid Other | Attending: Family Medicine | Admitting: Family Medicine

## 2022-03-05 DIAGNOSIS — I110 Hypertensive heart disease with heart failure: Secondary | ICD-10-CM | POA: Insufficient documentation

## 2022-03-05 DIAGNOSIS — I5030 Unspecified diastolic (congestive) heart failure: Secondary | ICD-10-CM | POA: Diagnosis present

## 2022-03-05 DIAGNOSIS — Z881 Allergy status to other antibiotic agents status: Secondary | ICD-10-CM

## 2022-03-05 DIAGNOSIS — L97822 Non-pressure chronic ulcer of other part of left lower leg with fat layer exposed: Secondary | ICD-10-CM | POA: Insufficient documentation

## 2022-03-05 DIAGNOSIS — M1712 Unilateral primary osteoarthritis, left knee: Secondary | ICD-10-CM | POA: Diagnosis present

## 2022-03-05 DIAGNOSIS — J441 Chronic obstructive pulmonary disease with (acute) exacerbation: Secondary | ICD-10-CM | POA: Diagnosis not present

## 2022-03-05 DIAGNOSIS — I252 Old myocardial infarction: Secondary | ICD-10-CM

## 2022-03-05 DIAGNOSIS — S30811A Abrasion of abdominal wall, initial encounter: Secondary | ICD-10-CM | POA: Diagnosis not present

## 2022-03-05 DIAGNOSIS — Z87891 Personal history of nicotine dependence: Secondary | ICD-10-CM | POA: Insufficient documentation

## 2022-03-05 DIAGNOSIS — Z6841 Body Mass Index (BMI) 40.0 and over, adult: Secondary | ICD-10-CM | POA: Insufficient documentation

## 2022-03-05 DIAGNOSIS — I10 Essential (primary) hypertension: Secondary | ICD-10-CM | POA: Diagnosis present

## 2022-03-05 DIAGNOSIS — E119 Type 2 diabetes mellitus without complications: Secondary | ICD-10-CM | POA: Diagnosis present

## 2022-03-05 DIAGNOSIS — J101 Influenza due to other identified influenza virus with other respiratory manifestations: Secondary | ICD-10-CM | POA: Insufficient documentation

## 2022-03-05 DIAGNOSIS — Z888 Allergy status to other drugs, medicaments and biological substances status: Secondary | ICD-10-CM

## 2022-03-05 DIAGNOSIS — I5032 Chronic diastolic (congestive) heart failure: Secondary | ICD-10-CM | POA: Insufficient documentation

## 2022-03-05 DIAGNOSIS — E785 Hyperlipidemia, unspecified: Secondary | ICD-10-CM | POA: Diagnosis present

## 2022-03-05 DIAGNOSIS — F411 Generalized anxiety disorder: Secondary | ICD-10-CM | POA: Diagnosis present

## 2022-03-05 DIAGNOSIS — I890 Lymphedema, not elsewhere classified: Secondary | ICD-10-CM | POA: Diagnosis not present

## 2022-03-05 DIAGNOSIS — Z1152 Encounter for screening for COVID-19: Secondary | ICD-10-CM

## 2022-03-05 DIAGNOSIS — Z833 Family history of diabetes mellitus: Secondary | ICD-10-CM

## 2022-03-05 DIAGNOSIS — Z86718 Personal history of other venous thrombosis and embolism: Secondary | ICD-10-CM | POA: Diagnosis not present

## 2022-03-05 DIAGNOSIS — Z91041 Radiographic dye allergy status: Secondary | ICD-10-CM

## 2022-03-05 DIAGNOSIS — I872 Venous insufficiency (chronic) (peripheral): Secondary | ICD-10-CM | POA: Insufficient documentation

## 2022-03-05 DIAGNOSIS — Z7901 Long term (current) use of anticoagulants: Secondary | ICD-10-CM | POA: Diagnosis not present

## 2022-03-05 DIAGNOSIS — Z7984 Long term (current) use of oral hypoglycemic drugs: Secondary | ICD-10-CM

## 2022-03-05 DIAGNOSIS — Z803 Family history of malignant neoplasm of breast: Secondary | ICD-10-CM

## 2022-03-05 DIAGNOSIS — G4733 Obstructive sleep apnea (adult) (pediatric): Secondary | ICD-10-CM | POA: Diagnosis present

## 2022-03-05 DIAGNOSIS — E1169 Type 2 diabetes mellitus with other specified complication: Secondary | ICD-10-CM | POA: Diagnosis present

## 2022-03-05 DIAGNOSIS — J111 Influenza due to unidentified influenza virus with other respiratory manifestations: Secondary | ICD-10-CM

## 2022-03-05 DIAGNOSIS — L97122 Non-pressure chronic ulcer of left thigh with fat layer exposed: Secondary | ICD-10-CM | POA: Insufficient documentation

## 2022-03-05 DIAGNOSIS — E11622 Type 2 diabetes mellitus with other skin ulcer: Secondary | ICD-10-CM | POA: Diagnosis not present

## 2022-03-05 DIAGNOSIS — Z8711 Personal history of peptic ulcer disease: Secondary | ICD-10-CM

## 2022-03-05 DIAGNOSIS — I2510 Atherosclerotic heart disease of native coronary artery without angina pectoris: Secondary | ICD-10-CM | POA: Diagnosis present

## 2022-03-05 DIAGNOSIS — Z886 Allergy status to analgesic agent status: Secondary | ICD-10-CM

## 2022-03-05 DIAGNOSIS — I82562 Chronic embolism and thrombosis of left calf muscular vein: Secondary | ICD-10-CM | POA: Diagnosis present

## 2022-03-05 DIAGNOSIS — F419 Anxiety disorder, unspecified: Secondary | ICD-10-CM | POA: Diagnosis present

## 2022-03-05 DIAGNOSIS — R0602 Shortness of breath: Secondary | ICD-10-CM | POA: Diagnosis not present

## 2022-03-05 DIAGNOSIS — Z825 Family history of asthma and other chronic lower respiratory diseases: Secondary | ICD-10-CM

## 2022-03-05 DIAGNOSIS — J4540 Moderate persistent asthma, uncomplicated: Secondary | ICD-10-CM | POA: Diagnosis present

## 2022-03-05 DIAGNOSIS — R0902 Hypoxemia: Secondary | ICD-10-CM | POA: Diagnosis present

## 2022-03-05 DIAGNOSIS — Z8709 Personal history of other diseases of the respiratory system: Secondary | ICD-10-CM

## 2022-03-05 LAB — CBC
HCT: 37.6 % (ref 36.0–46.0)
Hemoglobin: 11.1 g/dL — ABNORMAL LOW (ref 12.0–15.0)
MCH: 27.3 pg (ref 26.0–34.0)
MCHC: 29.5 g/dL — ABNORMAL LOW (ref 30.0–36.0)
MCV: 92.6 fL (ref 80.0–100.0)
Platelets: 106 10*3/uL — ABNORMAL LOW (ref 150–400)
RBC: 4.06 MIL/uL (ref 3.87–5.11)
RDW: 14 % (ref 11.5–15.5)
WBC: 4 10*3/uL (ref 4.0–10.5)
nRBC: 0 % (ref 0.0–0.2)

## 2022-03-05 LAB — BASIC METABOLIC PANEL
Anion gap: 9 (ref 5–15)
BUN: 7 mg/dL (ref 6–20)
CO2: 29 mmol/L (ref 22–32)
Calcium: 9.2 mg/dL (ref 8.9–10.3)
Chloride: 102 mmol/L (ref 98–111)
Creatinine, Ser: 0.74 mg/dL (ref 0.44–1.00)
GFR, Estimated: >60 mL/min (ref >60)
Glucose, Bld: 91 mg/dL (ref 70–99)
Potassium: 3.3 mmol/L — ABNORMAL LOW (ref 3.5–5.1)
Sodium: 140 mmol/L (ref 135–145)

## 2022-03-05 LAB — MAGNESIUM: Magnesium: 1.8 mg/dL (ref 1.7–2.4)

## 2022-03-05 LAB — URINALYSIS, ROUTINE W REFLEX MICROSCOPIC
Bilirubin Urine: NEGATIVE
Glucose, UA: NEGATIVE mg/dL
Hgb urine dipstick: NEGATIVE
Ketones, ur: NEGATIVE mg/dL
Leukocytes,Ua: NEGATIVE
Nitrite: NEGATIVE
Protein, ur: NEGATIVE mg/dL
Specific Gravity, Urine: 1.005 (ref 1.005–1.030)
pH: 6.5 (ref 5.0–8.0)

## 2022-03-05 LAB — LACTIC ACID, PLASMA
Lactic Acid, Venous: 1.3 mmol/L (ref 0.5–1.9)
Lactic Acid, Venous: 1.1 mmol/L (ref 0.5–1.9)

## 2022-03-05 LAB — RESP PANEL BY RT-PCR (FLU A&B, COVID) ARPGX2
Influenza A by PCR: POSITIVE — AB
Influenza B by PCR: NEGATIVE
SARS Coronavirus 2 by RT PCR: NEGATIVE

## 2022-03-05 LAB — BRAIN NATRIURETIC PEPTIDE: B Natriuretic Peptide: 68.2 pg/mL (ref 0.0–100.0)

## 2022-03-05 LAB — TROPONIN I (HIGH SENSITIVITY)
Troponin I (High Sensitivity): 8 ng/L (ref <18)
Troponin I (High Sensitivity): 9 ng/L (ref <18)

## 2022-03-05 LAB — PREGNANCY, URINE: Preg Test, Ur: NEGATIVE

## 2022-03-05 MED ORDER — FUROSEMIDE 40 MG PO TABS
60.0000 mg | ORAL_TABLET | Freq: Every day | ORAL | Status: DC
  Administered 2022-03-06 – 2022-03-07 (×2): 60 mg via ORAL
  Filled 2022-03-05 (×3): qty 1

## 2022-03-05 MED ORDER — METHYLPREDNISOLONE SODIUM SUCC 125 MG IJ SOLR
125.0000 mg | Freq: Once | INTRAMUSCULAR | Status: AC
  Administered 2022-03-05: 125 mg via INTRAVENOUS
  Filled 2022-03-05: qty 2

## 2022-03-05 MED ORDER — ATORVASTATIN CALCIUM 40 MG PO TABS
40.0000 mg | ORAL_TABLET | Freq: Every day | ORAL | Status: DC
  Administered 2022-03-05 – 2022-03-11 (×7): 40 mg via ORAL
  Filled 2022-03-05 (×7): qty 1

## 2022-03-05 MED ORDER — APIXABAN 5 MG PO TABS
5.0000 mg | ORAL_TABLET | Freq: Two times a day (BID) | ORAL | Status: DC
  Administered 2022-03-05 – 2022-03-11 (×12): 5 mg via ORAL
  Filled 2022-03-05 (×6): qty 1
  Filled 2022-03-05: qty 2
  Filled 2022-03-05: qty 1
  Filled 2022-03-05: qty 2
  Filled 2022-03-05 (×3): qty 1

## 2022-03-05 MED ORDER — ALBUTEROL SULFATE HFA 108 (90 BASE) MCG/ACT IN AERS: 2.0000 | INHALATION_SPRAY | RESPIRATORY_TRACT | Status: DC

## 2022-03-05 MED ORDER — OSELTAMIVIR PHOSPHATE 75 MG PO CAPS
75.0000 mg | ORAL_CAPSULE | Freq: Two times a day (BID) | ORAL | Status: DC
  Administered 2022-03-05 – 2022-03-10 (×11): 75 mg via ORAL
  Filled 2022-03-05 (×11): qty 1

## 2022-03-05 MED ORDER — ACETAMINOPHEN 500 MG PO TABS
1000.0000 mg | ORAL_TABLET | Freq: Four times a day (QID) | ORAL | Status: DC | PRN
  Administered 2022-03-06 – 2022-03-07 (×2): 1000 mg via ORAL
  Filled 2022-03-05 (×2): qty 2

## 2022-03-05 MED ORDER — SPIRONOLACTONE 25 MG PO TABS
25.0000 mg | ORAL_TABLET | Freq: Every day | ORAL | Status: DC
  Administered 2022-03-06: 25 mg via ORAL
  Filled 2022-03-05: qty 1

## 2022-03-05 MED ORDER — IPRATROPIUM-ALBUTEROL 0.5-2.5 (3) MG/3ML IN SOLN
3.0000 mL | RESPIRATORY_TRACT | Status: AC
  Administered 2022-03-05: 3 mL via RESPIRATORY_TRACT
  Filled 2022-03-05: qty 3

## 2022-03-05 MED ORDER — MAGNESIUM SULFATE 2 GM/50ML IV SOLN
2.0000 g | Freq: Once | INTRAVENOUS | Status: AC
  Administered 2022-03-05: 2 g via INTRAVENOUS
  Filled 2022-03-05: qty 50

## 2022-03-05 MED ORDER — BUSPIRONE HCL 15 MG PO TABS
7.5000 mg | ORAL_TABLET | Freq: Two times a day (BID) | ORAL | Status: DC
  Administered 2022-03-06 – 2022-03-11 (×10): 7.5 mg via ORAL
  Filled 2022-03-05 (×13): qty 1

## 2022-03-05 MED ORDER — ALBUTEROL SULFATE (2.5 MG/3ML) 0.083% IN NEBU: 2.5000 mg | INHALATION_SOLUTION | Freq: Four times a day (QID) | RESPIRATORY_TRACT | Status: DC | PRN

## 2022-03-05 MED ORDER — POTASSIUM CHLORIDE CRYS ER 20 MEQ PO TBCR
20.0000 meq | EXTENDED_RELEASE_TABLET | Freq: Every day | ORAL | Status: DC
  Administered 2022-03-06 – 2022-03-11 (×6): 20 meq via ORAL
  Filled 2022-03-05 (×6): qty 1

## 2022-03-05 MED ORDER — AMLODIPINE BESYLATE 5 MG PO TABS
2.5000 mg | ORAL_TABLET | Freq: Every day | ORAL | Status: DC
  Administered 2022-03-06: 2.5 mg via ORAL
  Filled 2022-03-05: qty 1

## 2022-03-05 MED ORDER — FUROSEMIDE 10 MG/ML IJ SOLN
20.0000 mg | Freq: Once | INTRAMUSCULAR | Status: AC
  Administered 2022-03-05: 20 mg via INTRAVENOUS
  Filled 2022-03-05: qty 2

## 2022-03-05 MED ORDER — PAROXETINE HCL 20 MG PO TABS
20.0000 mg | ORAL_TABLET | Freq: Every day | ORAL | Status: DC
  Administered 2022-03-07 – 2022-03-09 (×3): 20 mg via ORAL
  Filled 2022-03-05 (×3): qty 1

## 2022-03-05 MED ORDER — GABAPENTIN 100 MG PO CAPS
100.0000 mg | ORAL_CAPSULE | Freq: Three times a day (TID) | ORAL | Status: DC
  Administered 2022-03-05 – 2022-03-11 (×17): 100 mg via ORAL
  Filled 2022-03-05 (×17): qty 1

## 2022-03-05 MED ORDER — CARVEDILOL 12.5 MG PO TABS
25.0000 mg | ORAL_TABLET | Freq: Two times a day (BID) | ORAL | Status: DC
  Administered 2022-03-06 – 2022-03-11 (×11): 25 mg via ORAL
  Filled 2022-03-05 (×11): qty 2

## 2022-03-05 MED ORDER — IPRATROPIUM-ALBUTEROL 0.5-2.5 (3) MG/3ML IN SOLN
3.0000 mL | Freq: Once | RESPIRATORY_TRACT | Status: AC
  Administered 2022-03-05: 3 mL via RESPIRATORY_TRACT
  Filled 2022-03-05: qty 3

## 2022-03-05 MED ORDER — ACETAMINOPHEN 500 MG PO TABS
1000.0000 mg | ORAL_TABLET | Freq: Once | ORAL | Status: AC
  Administered 2022-03-05: 1000 mg via ORAL
  Filled 2022-03-05: qty 2

## 2022-03-05 MED ORDER — IRBESARTAN 150 MG PO TABS
150.0000 mg | ORAL_TABLET | Freq: Every day | ORAL | Status: DC
  Administered 2022-03-06 – 2022-03-11 (×6): 150 mg via ORAL
  Filled 2022-03-05 (×6): qty 1

## 2022-03-05 MED ORDER — ONDANSETRON HCL 4 MG/2ML IJ SOLN: 4.0000 mg | Freq: Once | INTRAMUSCULAR | Status: DC | PRN

## 2022-03-05 MED ORDER — METHOCARBAMOL 500 MG PO TABS
500.0000 mg | ORAL_TABLET | Freq: Two times a day (BID) | ORAL | Status: DC | PRN
  Administered 2022-03-06: 500 mg via ORAL
  Filled 2022-03-05: qty 1

## 2022-03-05 MED ORDER — POTASSIUM CHLORIDE CRYS ER 20 MEQ PO TBCR: 20.0000 meq | EXTENDED_RELEASE_TABLET | Freq: Every day | ORAL | Status: DC

## 2022-03-05 MED ORDER — MOMETASONE FURO-FORMOTEROL FUM 200-5 MCG/ACT IN AERO
2.0000 | INHALATION_SPRAY | Freq: Two times a day (BID) | RESPIRATORY_TRACT | Status: DC
  Administered 2022-03-06 – 2022-03-11 (×10): 2 via RESPIRATORY_TRACT
  Filled 2022-03-05 (×3): qty 8.8

## 2022-03-05 MED ORDER — POTASSIUM CHLORIDE CRYS ER 20 MEQ PO TBCR
40.0000 meq | EXTENDED_RELEASE_TABLET | Freq: Once | ORAL | Status: AC
  Administered 2022-03-05: 40 meq via ORAL
  Filled 2022-03-05: qty 2

## 2022-03-05 NOTE — ED Provider Notes (Signed)
[facility] EMERGENCY DEPT Provider Note   CSN: [id number] Arrival date & time: 03/05/22  1559     History  Chief Complaint  Patient presents with   Shortness of Breath   Chest Pain    [name] is a 51 y.o. female.   Shortness of Breath Associated symptoms: chest pain   Chest Pain Associated symptoms: shortness of breath    Patient is a 51 year old female with past medical history significant for HTN, CAD, cellulitis, anxiety, DM 2, OSA, morbid obesity, DVT on Eliquis, CHF  She is present emergency room today with complaints of fatigue over the course the day and states that when she was at her wound care clinic today she began feeling more short of breath and sweaty.  She states that she had her temperature checked and was found to have a temperature of 101.  She did not take any medication prior to arrival in the ER but came to the ER because of worsening dyspnea. She denies any hemoptysis.  She states that chest pain is more of a chest tightness. She states that she takes her Eliquis as prescribed she has no missed doses.     Home Medications Prior to Admission medications   Medication Sig Start Date End Date Taking? Authorizing Provider  Accu-Chek Softclix Lancets lancets Use as instructed Patient taking differently: 1 each by Other route See admin instructions. Use as instructed 11/29/20   Argentina Donovan, PA-C  acetaminophen (TYLENOL) 500 MG tablet Take 1 tablet (500 mg total) by mouth every 6 (six) hours as needed. Patient taking differently: Take 500 mg by mouth every 6 (six) hours as needed for mild pain. 04/19/20   Ladell Pier, MD  acetaminophen (TYLENOL) 650 MG CR tablet     [provider]  albuterol (PROAIR HFA) 108 (90 Base) MCG/ACT inhaler INHALE TWO PUFFS BY MOUTH EVERY 6 HOURS AS NEEDED FOR WHEEZING OR SHORTNESS OF BREATH Patient taking differently: Inhale 2 puffs into the lungs See admin instructions. INHALE TWO  PUFFS BY MOUTH EVERY 6 HOURS AS NEEDED FOR WHEEZING OR SHORTNESS OF BREATH 10/05/20   Ladell Pier, MD  amLODipine (NORVASC) 5 MG tablet TAKE 1/2 TABLET BY MOUTH DAILY 02/19/22   Ladell Pier, MD  apixaban (ELIQUIS) 5 MG TABS tablet Take 1 tablet (5 mg total) by mouth 2 (two) times daily. 11/22/21   Ladell Pier, MD  atorvastatin (LIPITOR) 40 MG tablet Take 1 tablet (40 mg total) by mouth daily. 11/22/21   Ladell Pier, MD  Blood Glucose Calibration (ACCU-CHEK GUIDE CONTROL) LIQD 1 each by In Vitro route daily. 12/13/20   Argentina Donovan, PA-C  Blood Glucose Monitoring Suppl (ACCU-CHEK GUIDE) w/Device KIT 1 each by Does not apply route 2 (two) times daily. 11/29/20   Argentina Donovan, PA-C  busPIRone (BUSPAR) 7.5 MG tablet Take 1 tablet by mouth 2 (two) times daily.    [provider]  Carboxymethylcellulose Sodium (EYE DROPS OP) Place 1 drop into both eyes daily as needed (dry eyes).    [provider]  carvedilol (COREG) 25 MG tablet Take 1 tablet (25 mg total) by mouth 2 (two) times daily with a meal. 01/07/22   Ladell Pier, MD  cholecalciferol (VITAMIN D3) 25 MCG (1000 UNIT) tablet Take 1 tablet (1,000 Units total) by mouth daily. 04/07/20   Ladell Pier, MD  COVID-19 mRNA Vac-TriS, Pfizer, (PFIZER-BIONT COVID-19 VAC-TRIS) SUSP injection Inject into the muscle. 06/26/21  Carlyle Basques, MD  diphenhydrAMINE (BENADRYL) 50 MG tablet Take 1 tablet (50 mg total) by mouth once for 1 dose. Pt to take 50 mg of benadryl on 11/06/21 at 7:30 AM. Please call [phone] with any questions. 10/31/21 10/31/21  Nelson Chimes, MD  FARXIGA 10 MG TABS tablet TAKE 1 TABLET BY MOUTH DAILY 02/11/22   Ladell Pier, MD  FEROSUL 325 (65 Fe) MG tablet TAKE ONE TABLET BY MOUTH DAILY WITH BREAKFAST 06/06/21   Ladell Pier, MD  furosemide (LASIX) 80 MG tablet Take 1 tablet (80 mg total) by mouth daily. Take additional dose of lasix for weight gain, leg swelling 06/01/21    Barb Merino, MD  gabapentin (NEURONTIN) 100 MG capsule Take 1 capsule (100 mg total) by mouth 3 (three) times daily. 01/07/22   Ladell Pier, MD  glucose blood test strip Use as instructed Patient taking differently: 1 each by Other route See admin instructions. Use as instructed 11/29/20   Argentina Donovan, PA-C  hydrocortisone cream 1 % Apply 1 application topically daily as needed for itching.    [provider]  hydrOXYzine (VISTARIL) 25 MG capsule Take 1 capsule (25 mg total) by mouth at bedtime as needed. Patient taking differently: Take 25 mg by mouth at bedtime as needed for anxiety or itching. 04/20/21   Ladell Pier, MD  methocarbamol (ROBAXIN) 500 MG tablet Take 1 tablet (500 mg total) by mouth 2 (two) times daily as needed for muscle spasms. 01/07/22   Ladell Pier, MD  mometasone-formoterol (DULERA) 200-5 MCG/ACT AERO INHALE 2 PUFFS BY MOUTH EVERY MORNING AND INHALE TWO PUFFS BY MOUTH EVERY NIGHT AT BEDTIME 01/29/22   Ladell Pier, MD  nitroGLYCERIN (NITROSTAT) 0.4 MG SL tablet Place 1 tablet (0.4 mg total) under the tongue every 5 (five) minutes as needed for chest pain. 11/22/21   Ladell Pier, MD  omeprazole (PRILOSEC) 20 MG capsule Take 1 capsule (20 mg total) by mouth daily as needed (acid reflux). 05/01/21 09/18/21  Fenton Foy, NP  PARoxetine (PAXIL) 20 MG tablet Take 1 tablet (20 mg total) by mouth daily. 11/22/21   Ladell Pier, MD  potassium chloride SA (KLOR-CON M20) 20 MEQ tablet Take 1 tablet (20 mEq total) by mouth daily. 11/22/21   Ladell Pier, MD  predniSONE (DELTASONE) 50 MG tablet Pt to take 50 mg of prednisone on 11/05/21 at 7:30 PM, 50 mg of prednisone on 11/06/21 at 1:30 AM, and 50 mg of prednisone on 11/06/21 at 7:30 AM. Pt is also to take 50 mg of benadryl on 11/06/21 at 7:30 AM. Please call [phone] with any questions. Patient not taking: Reported on 01/29/2022 10/31/21   Nelson Chimes, MD  Semaglutide, 1 MG/DOSE, 4  MG/3ML SOPN Inject 1 mg as directed once a week. 02/04/22   Ladell Pier, MD  spironolactone (ALDACTONE) 25 MG tablet Take 1 tablet (25 mg total) by mouth daily. 01/07/22   Ladell Pier, MD  valsartan (DIOVAN) 160 MG tablet Take 1 tablet (160 mg total) by mouth daily. 02/14/21   Leonie Man, MD  vitamin B-12 (CYANOCOBALAMIN) 1000 MCG tablet Take 1,000 mcg by mouth daily.    [provider]      Allergies    Ace inhibitors, Aspirin, Hydromorphone, Vancomycin, Contrast media [iodinated contrast media], Dilaudid [hydromorphone hcl], and Lidocaine    Review of Systems   Review of Systems  Respiratory:  Positive for shortness of breath.   Cardiovascular:  Positive for chest pain.    Physical Exam Updated Vital Signs BP 100/64   Pulse (!) 113   Temp 99.6 F (37.6 C) (Oral)   Resp (!) 23   Ht _0  (1.575 m)   Wt (!) 146.5 kg   SpO2 97%   BMI 59.08 kg/m  Physical Exam Vitals and nursing note reviewed.  Constitutional:      Appearance: She is ill-appearing.  HENT:     Head: Normocephalic and atraumatic.     Nose: Nose normal.     Mouth/Throat:     Mouth: Mucous membranes are moist.  Eyes:     General: No scleral icterus. Cardiovascular:     Rate and Rhythm: Regular rhythm. Tachycardia present.     Pulses: Normal pulses.     Heart sounds: Normal heart sounds.  Pulmonary:     Effort: Pulmonary effort is normal. No respiratory distress.     Breath sounds: Wheezing and rales present.     Comments: Faint crackles in bilateral bases, easily audible wheezes diffusely.  No stridor Abdominal:     Palpations: Abdomen is soft.     Tenderness: There is no abdominal tenderness. There is no guarding or rebound.  Musculoskeletal:     Cervical back: Normal range of motion.     Comments: No significant pitting edema of lower extremities.  Left leg is wrapped from wound care earlier today  Skin:    General: Skin is warm and dry.     Capillary Refill: Capillary  refill takes less than 2 seconds.  Neurological:     Mental Status: She is alert. Mental status is at baseline.  Psychiatric:        Mood and Affect: Mood normal.        Behavior: Behavior normal.     ED Results / Procedures / Treatments   Labs (all labs ordered are listed, but only abnormal results are displayed) Labs Reviewed  RESP PANEL BY RT-PCR (FLU A&B, COVID) ARPGX2 - Abnormal; Notable for the following components:      Result Value   Influenza A by PCR POSITIVE (*)    All other components within normal limits  BASIC METABOLIC PANEL - Abnormal; Notable for the following components:   Potassium 3.3 (*)    All other components within normal limits  CBC - Abnormal; Notable for the following components:   Hemoglobin 11.1 (*)    MCHC 29.5 (*)    Platelets 106 (*)    All other components within normal limits  URINALYSIS, ROUTINE W REFLEX MICROSCOPIC - Abnormal; Notable for the following components:   Color, Urine COLORLESS (*)    All other components within normal limits  CULTURE, BLOOD (ROUTINE X 2)  CULTURE, BLOOD (ROUTINE X 2)  PREGNANCY, URINE  LACTIC ACID, PLASMA  BRAIN NATRIURETIC PEPTIDE  LACTIC ACID, PLASMA  MAGNESIUM  TROPONIN I (HIGH SENSITIVITY)  TROPONIN I (HIGH SENSITIVITY)    EKG None  Radiology DG Chest Portable 1 View  Result Date: 03/05/2022 CLINICAL DATA:  Shortness of breath. EXAM: PORTABLE CHEST 1 VIEW COMPARISON:  February 07, 2022. FINDINGS: The heart size and mediastinal contours are within normal limits. Both lungs are clear. The visualized skeletal structures are unremarkable. IMPRESSION: No active disease. Electronically Signed   By: Marijo Conception M.D.   On: 03/05/2022 18:07    Procedures .Critical Care  Performed by: Tedd Sias, PA Authorized by: Tedd Sias, PA   Critical care provider statement:  Critical care time (minutes):  35   Critical care time was exclusive of:  Separately billable procedures and treating other  patients and teaching time   Critical care was necessary to treat or prevent imminent or life-threatening deterioration of the following conditions:  Respiratory failure   Critical care was time spent personally by me on the following activities:  Development of treatment plan with patient or surrogate, review of old charts, re-evaluation of patient's condition, pulse oximetry, ordering and review of radiographic studies, ordering and review of laboratory studies, ordering and performing treatments and interventions, obtaining history from patient or surrogate, examination of patient and evaluation of patient's response to treatment   Care discussed with: admitting provider       Medications Ordered in ED Medications  oseltamivir (TAMIFLU) capsule 75 mg (75 mg Oral Given 03/05/22 1858)  ondansetron (ZOFRAN) injection 4 mg (has no administration in time range)  ipratropium-albuterol (DUONEB) 0.5-2.5 (3) MG/3ML nebulizer solution 3 mL (has no administration in time range)  magnesium sulfate IVPB 2 g 50 mL (2 g Intravenous New Bag/Given 03/05/22 1858)  amLODipine (NORVASC) tablet 2.5 mg (2.5 mg Oral Not Given 03/05/22 1904)  apixaban (ELIQUIS) tablet 5 mg (has no administration in time range)  atorvastatin (LIPITOR) tablet 40 mg (has no administration in time range)  busPIRone (BUSPAR) tablet 7.5 mg (has no administration in time range)  carvedilol (COREG) tablet 25 mg (has no administration in time range)  furosemide (LASIX) tablet 60 mg (60 mg Oral Not Given 03/05/22 1904)  gabapentin (NEURONTIN) capsule 100 mg (has no administration in time range)  methocarbamol (ROBAXIN) tablet 500 mg (has no administration in time range)  mometasone-formoterol (DULERA) 200-5 MCG/ACT inhaler 2 puff (2 puffs Inhalation Not Given 03/05/22 1848)  PARoxetine (PAXIL) tablet 20 mg (20 mg Oral Not Given 03/05/22 1906)  spironolactone (ALDACTONE) tablet 25 mg (25 mg Oral Not Given 03/05/22 1905)  irbesartan (AVAPRO) tablet  150 mg (150 mg Oral Not Given 03/05/22 1905)  acetaminophen (TYLENOL) tablet 1,000 mg (has no administration in time range)  potassium chloride SA (KLOR-CON M) CR tablet 20 mEq (has no administration in time range)  albuterol (PROVENTIL) (2.5 MG/3ML) 0.083% nebulizer solution 2.5 mg (has no administration in time range)  acetaminophen (TYLENOL) tablet 1,000 mg (1,000 mg Oral Given 03/05/22 1737)  furosemide (LASIX) injection 20 mg (20 mg Intravenous Given 03/05/22 1738)  ipratropium-albuterol (DUONEB) 0.5-2.5 (3) MG/3ML nebulizer solution 3 mL (3 mLs Nebulization Given 03/05/22 1740)  potassium chloride SA (KLOR-CON M) CR tablet 40 mEq (40 mEq Oral Given 03/05/22 1737)  methylPREDNISolone sodium succinate (SOLU-MEDROL) 125 mg/2 mL injection 125 mg (125 mg Intravenous Given 03/05/22 1854)    ED Course/ Medical Decision Making/ A&P Clinical Course as of 03/05/22 1934  Tue Mar 05, 2022  1839 Resp(!): 23 [WF]  1839 Pulse Rate(!): 117 [WF]  1839 Temp: 99.3 F (37.4 C) [WF]  1610 ED EKG Sinus tach [WF]    Clinical Course User Index [WF] Tedd Sias, PA                           Medical Decision Making Amount and/or Complexity of Data Reviewed Labs: ordered. Radiology: ordered. ECG/medicine tests:  Decision-making details documented in ED Course.  Risk OTC drugs. Prescription drug management. Decision regarding hospitalization.   This patient presents to the ED for concern of fever, shortness of breath, wheezing, this involves a number of treatment options, and is a complaint  that carries with it a high risk of complications and morbidity. A differential diagnosis was considered for the patient's symptoms which is discussed below:   Likely COPD exacerbation exacerbated by a viral illness  The causes for shortness of breath include but are not limited to Cardiac (AHF, pericardial effusion and tamponade, arrhythmias, ischemia, etc) Respiratory (COPD, asthma, pneumonia, pneumothorax,  primary pulmonary hypertension, PE/VQ mismatch) Hematological (anemia) Neuromuscular (ALS, Guillain-Barr, etc)    Co morbidities: Discussed in HPI   Brief History:  Patient is a 51 year old female with past medical history significant for HTN, CAD, cellulitis, anxiety, DM 2, OSA, morbid obesity, DVT on Eliquis, CHF  She is present emergency room today with complaints of fatigue over the course the day and states that when she was at her wound care clinic today she began feeling more short of breath and sweaty.  She states that she had her temperature checked and was found to have a temperature of 101.  She did not take any medication prior to arrival in the ER but came to the ER because of worsening dyspnea. She denies any hemoptysis.  She states that chest pain is more of a chest tightness. She states that she takes her Eliquis as prescribed she has no missed doses.    EMR reviewed including pt PMHx, past surgical history and past visits to ER.   See HPI for more details   Lab Tests:   I ordered and independently interpreted labs. Labs notable for COVID test negative Influenza A positive BMP with mild hypokalemia given that patient will be given significant amount of albuterol will give oral potassium here Troponin within normal limits BNP within normal limits patient does not appear to be fluid overloaded CBC without leukocytosis or anemia of significance Lactate within normal limits, blood cultures obtained.  Pregnancy negative.   Imaging Studies:  NAD. I personally reviewed all imaging studies and no acute abnormality found. I agree with radiology interpretation.  No evidence of pulmonary edema on chest x-ray agree with radiology read  Cardiac Monitoring:  The patient was maintained on a cardiac monitor.  I personally viewed and interpreted the cardiac monitored which showed an underlying rhythm of: Sinus tachycardia EKG non-ischemic   Medicines ordered:  I  ordered medication including Solu-Medrol, DuoNeb x2, 20 mg Lasix IV, Tylenol for fever, potassium chloride, magnesium sulfate, oseltamivir, home medications were also ordered Reevaluation of the patient after these medicines showed that the patient improved I have reviewed the patients home medicines and have made adjustments as needed  Slight improvement in patient's symptoms however wheezing significantly and tachypneic and tachycardic still    Critical Interventions:   close monitoring, nebulizer treatments and IV steroids   Consults/Attending Physician   I requested consultation with Dr. Nevada Crane,  and discussed lab and imaging findings as well as pertinent plan - they recommend: Admission   Reevaluation:  After the interventions noted above I re-evaluated patient and found that they have :stayed the same   Social Determinants of Health:      Problem List / ED Course:  COPD exacerbation secondary to influenza.  Fever treated with Tylenol and chest x-ray without pulmonary edema patient does not appear floridly fluid overloaded.  Started on [employer] and 2 of DuoNebs. Medications were ordered, as needed's ordered, Dr. Philip Aspen aware of pt.    Dispostion:  After consideration of the diagnostic results and the patients response to treatment, I feel that the patent would benefit from admission to the hospital  Final  Clinical Impression(s) / ED Diagnoses Final diagnoses:  COPD exacerbation [facility])  Influenza    Rx / DC Orders ED Discharge Orders     None         Tedd Sias, [place] 03/05/22 2018    Fransico Meadow, MD 03/06/22 1253

## 2022-03-05 NOTE — Progress Notes (Addendum)
[name] ([id number]) [id number] Page 1 of 12 Visit Report for 03/05/2022 Chief Complaint Document Details Patient Name: Date of Service: [name], [name]. 03/05/2022 9:00 A M Medical Record Number: [id number] Patient Account Number: [id number] Date of Birth/Sex: Treating RN: July 28, 1970 (51 y.o. F) Primary Care Provider: [name] Other Clinician: Referring Provider: Treating Provider/Extender: Annice Needy in Treatment: 0 Information Obtained from: Patient Chief Complaint 07/23/2021; Left lower extremity wound 03/05/2022: Returns with a wound in the same location on her left lower extremity, as well as 1 in her left groin. Electronic Signature(s) Signed: 03/05/2022 9:41:45 AM By: Fredirick Maudlin MD FACS Entered By: Fredirick Maudlin on 03/05/2022 09:41:45 -------------------------------------------------------------------------------- Debridement Details Patient Name: Date of Service: [name], [name]. 03/05/2022 9:00 A M Medical Record Number: [id number] Patient Account Number: [id number] Date of Birth/Sex: Treating RN: Jul 18, 1970 (51 y.o. [name], [name] Primary Care Provider: [name] Other Clinician: Referring Provider: Treating Provider/Extender: Annice Needy in Treatment: 0 Debridement Performed for Assessment: Wound #10 Left,Medial Lower Leg Performed By: Physician Fredirick Maudlin, MD Debridement Type: Debridement Severity of Tissue Pre Debridement: Limited to breakdown of skin Level of Consciousness (Pre-procedure): Awake and Alert Pre-procedure Verification/Time Out Yes - 09:25 Taken: Start Time: 09:25 Pain Control: Lidocaine 5% topical ointment T Area Debrided (L x W): otal 4 (cm) x 1.1 (cm) = 4.4 (cm) Tissue and other material debrided: Viable, Non-Viable, Eschar, Slough, Slough Level: Non-Viable Tissue Debridement Description: Selective/Open  Wound Instrument: Curette Bleeding: Minimum Hemostasis Achieved: Pressure End Time: 09:30 Procedural Pain: 0 Post Procedural Pain: 0 Response to Treatment: Procedure was tolerated well Level of Consciousness (Post- Awake and Alert procedure): Post Debridement Measurements of Total Wound Length: (cm) 5.4 Width: (cm) 1.1 Depth: (cm) 0.1 Volume: (cm) 0.467 [name] ([id number]) [id number] Page 2 of 12 Character of Wound/Ulcer Post Debridement: Improved Severity of Tissue Post Debridement: Fat layer exposed Post Procedure Diagnosis Same as Pre-procedure Electronic Signature(s) Signed: 03/05/2022 10:18:06 AM By: Fredirick Maudlin MD FACS Signed: 03/05/2022 5:13:10 PM By: Deon Pilling RN, BSN Entered By: Deon Pilling on 03/05/2022 09:30:36 -------------------------------------------------------------------------------- Debridement Details Patient Name: Date of Service: [name], [name]. 03/05/2022 9:00 A M Medical Record Number: [id number] Patient Account Number: [id number] Date of Birth/Sex: Treating RN: [birth year] (51 y.o. [name], [name] Primary Care Provider: [name] Other Clinician: Referring Provider: Treating Provider/Extender: Annice Needy in Treatment: 0 Debridement Performed for Assessment: Wound #9 Left Groin Performed By: Physician Fredirick Maudlin, MD Debridement Type: Debridement Level of Consciousness (Pre-procedure): Awake and Alert Pre-procedure Verification/Time Out Yes - 09:25 Taken: Start Time: 09:25 Pain Control: Lidocaine 5% topical ointment T Area Debrided (L x W): otal 0.9 (cm) x 1.6 (cm) = 1.44 (cm) Tissue and other material debrided: Viable, Non-Viable, Slough, Subcutaneous, Slough Level: Skin/Subcutaneous Tissue Debridement Description: Excisional Instrument: Curette Bleeding: Minimum Hemostasis Achieved: Pressure End Time: 09:30 Procedural Pain: 0 Post Procedural  Pain: 0 Response to Treatment: Procedure was tolerated well Level of Consciousness (Post- Awake and Alert procedure): Post Debridement Measurements of Total Wound Length: (cm) 0.9 Width: (cm) 1.6 Depth: (cm) 0.1 Volume: (cm) 0.113 Character of Wound/Ulcer Post Debridement: Improved Post Procedure Diagnosis Same as Pre-procedure Electronic Signature(s) Signed: 03/05/2022 10:18:06 AM By: Fredirick Maudlin MD FACS Signed: 03/05/2022 5:13:10 PM By: Deon Pilling RN, BSN Entered By: Deon Pilling on 03/05/2022 09:30:57 HPI Details -------------------------------------------------------------------------------- [name] ([id number]) [id number] Page 3 of 12 Patient Name: Date of Service: [name], Megan  [name]. 03/05/2022 9:00 A M Medical Record Number: [id number] Patient Account Number: [id number] Date of Birth/Sex: Treating RN: 1971-02-13 (51 y.o. F) Primary Care Provider: [name] Other Clinician: Referring Provider: Treating Provider/Extender: Annice Needy in Treatment: 0 History of Present Illness HPI Description: Admission 6/24 [name] is a 51 year old female with a past medical history of chronic venous insufficiency, lymphedema, DVT on anticoagulation and diastolic heart failure that presents to the clinic for left lower extremity wound. She was last seen 4 months ago in our clinic for the same issue. The reoccurring wound started at the end of May and has been going on for 1 month. She has been using an ointment and I am unclear what this is. She tries to keep her leg elevated with her compression stocking. She also reports she has lymphedema pumps and has been using them as well. She reports mild pain to the area. She denies signs of infection including increased warmth, erythema or purulent drainage. 7/1; patient presents for 1 week follow-up. She has tolerated the wrap well. Unfortunately she did  have trouble with the wrap Sliding down her leg 2 days ago. She has no issues or complaints today. She denies signs of infection. 7/15; patient presents for follow-up. She has tolerated the wrap well. She has no issues or complaints today. She denies signs of infection. 7/25; patient presents for 1 week follow-up. She has no issues or complaints today. She denies signs of infection. 8/4; patient presents for follow-up. She has tolerated the compression wrap well to her left lower extremity. She states she was in the hospital last week due to fluid overload. She subsequently developed blisters to her bottom from being in the hospital bed for prolonged periods of time. These have since ruptured. She now has 2 areas of skin breakdown. She has been keeping gauze on them. She denies signs of infection. 8/18; patient presents for 2-week follow-up. She reports improvement to her buttocks wounds. She is able to tolerate the wrap well to her left lower extremity with no issues. She denies signs of infection. 9/1; patient presents for 2-week follow-up. She has no issues or complaints today. she denies signs of infection. 9/15; patient presents for 2-week follow-up. She has no issues or complaints today. She has tolerated the wrap well. She denies signs of infection. 9/27; patient presents for 1 week follow-up. She has no issues or complaints today. 10/20; patient presents for follow-up. She was on vacation for the past 3 weeks. She states she had the last clinic visit wrap in place for 1 week and then she took this off. She states that she has been using her compression wraps since. She states that the wound has gotten larger. She reports pain to the wound site. She denies fever/chills, nausea/vomiting. She denies purulent drainage. 10/24; patient presents for follow-up. She states she tolerated the Kerlix/Coban wrap well. She reports decrease in pain. She states she started Keflex. She denies systemic signs  of infection. 10/28; patient presents for follow-up. She has no issues or complaints today. She denies acute pain. She tolerated the wrap well. 11/4; patient presents for follow-up. She has no issues or complaints. She denies signs of infection. 11/11; patient presents for follow-up. She has no issues or complaints today. She denies signs of infection. She tolerated the wrap well. 11/18; patient presents for follow-up. She states she took a shower however despite a plastic bag the wrap got wet. She currently denies signs of infection. 12/2;  patient presents for follow-up. She tolerated the wrap well. She has no issues or complaints today. 12/12; patient presents for follow-up. She has no issues or complaints today. Readmission 07/23/2021 Ms. Sahvanna Mcmanigal is a 51 year old female with a past medical history of chronic venous insufficiency that presents to the clinic for a left lower extremity wound. She was seen previously in the clinic for the same wound and this was healed on 04/09/2021. Since then she has been wearing her compression stockings intermittently. She reports more swelling to her left lower extremity over the past month and she stopped wearing her compression stocking. Subsequently she developed a wound. Her Lasix has been increased and she has noticed an improvement in her swelling. She currently denies signs of infection. 4/6; patient presents for follow-up. She states that the wrap stayed in place for 2 days before it rolled down and she had to take it off. She has compression stockings however states she could not get these on due to the swelling. She denies signs of infection. 4/13; patient with chronic venous insufficiency that has been in this clinic previously. She has a small pair of open areas on the left lower leg in the middle of previous scar tissue from I believe a previous wound. Most concerning today is how poor her edema control is above the sclerotic skin in her lower  extremity. This is nonpitting and compatible with lymphedema. She tells me that she was using a juxta lite stocking although it was old we are apparently replacing this. She does not seem aware of how to adjust the tension with this and I think she is going to require 30/40 mmHg equivalent compression She has her arterial studies tomorrow at 2:00 4/25; patient presents for follow-up. ABIs done on 4/14 showed a left ABI of 1.04 and TBI of 0.99. She tolerated the compression wrap well This week. She states she is going to obtain compression stockings today. She has no issues or complaints today. 5/2; patient presents for follow-up. She states her compression stockings are being delivered today. Her wound is closed today. READMISSION 03/05/2022 Despite having worn her juxta lite stockings, the patient reports that the area on her left medial lower leg reopened a couple of weeks ago. She says that she has been applying lotion and leaving it open to air to dry. She also had a new wound open in her left groin. She is not sure how this started, but says it may have been an ingrown hair or pimple. There is slough accumulation in both sites. The leg wound is fairly desiccated. Electronic Signature(s) Signed: 03/05/2022 9:44:22 AM By: Fredirick Maudlin MD FACS Entered By: Fredirick Maudlin on 03/05/2022 09:44:21 [name] ([id number]) [id number] Page 4 of 12 -------------------------------------------------------------------------------- Physical Exam Details Patient Name: Date of Service: [name], [name]. 03/05/2022 9:00 A M Medical Record Number: [id number] Patient Account Number: [id number] Date of Birth/Sex: Treating RN: Nov 30, 1970 (51 y.o. F) Primary Care Provider: [name] Other Clinician: Referring Provider: Treating Provider/Extender: Annice Needy in Treatment: 0 Constitutional .Tachycardic, asymptomatic.. . . No acute  distress. Respiratory Normal work of breathing on room air.. Notes 03/05/2022: On her left medial lower leg, there is scar tissue consistent with multiple prior episodes of wounding. There are several small open areas adjacent to each other. The surface has granulation tissue present, but is quite dry. There is slough and eschar present. In the left groin, there is a circular wound with thick slough present. No  periwound erythema, induration, purulent drainage, or malodor. Electronic Signature(s) Signed: 03/05/2022 9:45:40 AM By: Fredirick Maudlin MD FACS Entered By: Fredirick Maudlin on 03/05/2022 09:45:40 -------------------------------------------------------------------------------- Physician Orders Details Patient Name: Date of Service: [name], [name]. 03/05/2022 9:00 A M Medical Record Number: [id number] Patient Account Number: [id number] Date of Birth/Sex: Treating RN: 1970-10-02 (51 y.o. 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 - Dr. Celine Ahr and [place] room 1 Tuesday 1030 03/12/2022 Return A Anesthetic (In clinic) Topical Lidocaine 5% applied to wound bed Bathing/ Shower/ Hygiene May shower with protection but do not get wound dressing(s) wet. Edema Control - Lymphedema / SCD / Other Elevate legs to the level of the heart or above for 30 minutes daily and/or when sitting, a frequency of: - 3-4 times a day throughout the day. Avoid  standing for long periods of time. Exercise regularly [name], [name] ([id number]) [id number] Page 5 of 12 Wound Treatment Wound #10 - Lower Leg Wound Laterality: Left, Medial Cleanser: Soap and Water 1 x Per Week/30 Days Discharge Instructions: May shower and wash wound with dial antibacterial soap and water prior to dressing change. Cleanser: Wound Cleanser 1 x Per Week/30 Days Discharge Instructions: Cleanse the wound with wound cleanser prior to applying a clean dressing using gauze sponges, not tissue or cotton balls. Peri-Wound Care: Sween Lotion (Moisturizing lotion) 1 x Per Week/30 Days Discharge Instructions: Apply moisturizing lotion as directed Prim Dressing: Promogran Prisma Matrix, 4.34 (sq in) (silver collagen) ary 1 x Per Week/30 Days Discharge Instructions: Moisten collagen with hydrogel Secondary Dressing: ABD Pad, 8x10 1 x Per Week/30 Days Discharge Instructions: Apply over primary dressing as directed. Compression Wrap: FourPress (4 layer compression wrap) 1 x Per Week/30 Days Discharge Instructions: Apply four layer compression as directed. Apply first layer of unna boot to upper portion of lower leg. Wound #9 - Groin Wound Laterality: Left Cleanser: Soap and Water 1 x Per Day/30 Days Discharge Instructions: May shower and wash wound with dial antibacterial soap and water prior to dressing change. Prim Dressing: KerraCel Ag Gelling Fiber Dressing, 4x5 in (silver alginate) 1 x Per Day/30 Days ary Discharge Instructions: Apply silver alginate to wound bed as instructed Secondary Dressing: Woven Gauze Sponge, Non-Sterile 4x4 in 1 x Per Day/30 Days Discharge Instructions: Apply over primary dressing as directed. Patient Medications llergies: ACE Inhibitors, hydromorphone, vancomycin, ferrous sulfate A Notifications Medication Indication Start End 03/05/2022 lidocaine DOSE topical 5 % cream - cream topical applied only in clinic for  debridements. Electronic Signature(s) Signed: 03/05/2022 10:18:06 AM By: Fredirick Maudlin MD FACS Entered By: Fredirick Maudlin on 03/05/2022 09:47:07 -------------------------------------------------------------------------------- Problem List Details Patient Name: Date of Service: [name], [name]. 03/05/2022 9:00 A M Medical Record Number: [id number] Patient Account Number: [id number] Date of Birth/Sex: Treating RN: [birth year] (51 y.o. 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 03/05/2022 No Yes [name], [name] ([id number]) [id number] Page 6 of 12 I50.30 Unspecified diastolic (congestive) heart failure 03/05/2022 No Yes I87.2 Venous insufficiency (chronic) (peripheral) 03/05/2022 No Yes I89.0 Lymphedema, not elsewhere classified 03/05/2022 No Yes E66.01 Morbid (severe) obesity due to excess calories 03/05/2022 No Yes Inactive Problems Resolved Problems Electronic Signature(s) Signed: 03/05/2022 9:39:50 AM By: Fredirick Maudlin MD FACS Previous Signature: 03/05/2022 8:51:22 AM Version By: Fredirick Maudlin MD FACS Previous Signature: 03/05/2022 8:50:10 AM Version By: Fredirick Maudlin MD FACS Entered By: Fredirick Maudlin on 03/05/2022 09:39:50 -------------------------------------------------------------------------------- Progress Note Details Patient Name: Date of Service: [name], [name]. 03/05/2022 9:00 A M Medical Record Number: [id number] Patient Account Number: [id number] Date of Birth/Sex: Treating RN: 1970/05/11 (51 y.o. F) Primary Care Provider: [name] Other  Clinician: Referring Provider: Treating Provider/Extender: Annice Needy in Treatment: 0 Subjective Chief Complaint Information obtained from Patient 07/23/2021; Left lower extremity wound 03/05/2022: Returns with a wound in the same location on her left lower extremity, as well as 1 in her left groin. History of Present Illness (HPI) Admission 6/24 Ms. Marieelena Bartko is a 50 year old female with a past medical history of chronic venous insufficiency, lymphedema, DVT on anticoagulation and diastolic heart failure that presents to the clinic for left lower extremity wound. She was last seen 4 months ago in our clinic for the same issue. The reoccurring wound started at the end of May and has been going on for 1 month. She has been using an ointment and I am unclear what this is. She tries to keep her leg elevated with her compression stocking. She also reports she has lymphedema pumps and has been using them as well. She reports mild pain to the area. She denies signs of infection including increased warmth, erythema or purulent drainage. 7/1; patient presents for 1 week follow-up. She has tolerated the wrap well. Unfortunately she did have trouble with the wrap Sliding down her leg 2 days ago. She has no issues or complaints today. She denies signs of infection. 7/15; patient presents for follow-up. She has tolerated the wrap well. She has no issues or complaints today. She denies signs of infection. 7/25; patient presents for 1 week follow-up. She has no issues or complaints today. She denies signs of infection. 8/4; patient presents for follow-up. She has tolerated the compression wrap well to her left lower extremity. She states she was in the hospital last week due to fluid overload. She subsequently developed blisters to her bottom from being in the hospital bed for prolonged periods of time. These have since ruptured. She now has 2 areas of skin breakdown. She has  been keeping gauze on them. She denies signs of infection. 8/18; patient presents for 2-week follow-up. She reports improvement to her buttocks wounds. She is able to tolerate the wrap well to her left lower extremity with no issues. She denies signs of infection. 9/1; patient presents for 2-week follow-up. She has no issues or complaints today. she denies signs of infection. 9/15; patient presents for 2-week follow-up. She has no issues or complaints today. She has tolerated the wrap well. She denies signs of infection. 9/27; patient presents for 1 week follow-up. She has no issues or complaints today. 10/20; patient presents for follow-up. She was on vacation for the past 3 weeks. She states she had the last clinic visit wrap in place for 1 week and then she took this off. She states that she  has been using her compression wraps since. She states that the wound has gotten larger. She reports pain to the wound [name], [name] ([id number]) [id number] Page 7 of 12 site. She denies fever/chills, nausea/vomiting. She denies purulent drainage. 10/24; patient presents for follow-up. She states she tolerated the Kerlix/Coban wrap well. She reports decrease in pain. She states she started Keflex. She denies systemic signs of infection. 10/28; patient presents for follow-up. She has no issues or complaints today. She denies acute pain. She tolerated the wrap well. 11/4; patient presents for follow-up. She has no issues or complaints. She denies signs of infection. 11/11; patient presents for follow-up. She has no issues or complaints today. She denies signs of infection. She tolerated the wrap well. 11/18; patient presents for follow-up. She states she took a shower however despite a plastic bag the wrap got wet. She currently denies signs of infection. 12/2; patient presents for follow-up. She tolerated the wrap well. She has no issues or complaints today. 12/12; patient  presents for follow-up. She has no issues or complaints today. Readmission 07/23/2021 [name] is a 51 year old female with a past medical history of chronic venous insufficiency that presents to the clinic for a left lower extremity wound. She was seen previously in the clinic for the same wound and this was healed on 04/09/2021. Since then she has been wearing her compression stockings intermittently. She reports more swelling to her left lower extremity over the past month and she stopped wearing her compression stocking. Subsequently she developed a wound. Her Lasix has been increased and she has noticed an improvement in her swelling. She currently denies signs of infection. 4/6; patient presents for follow-up. She states that the wrap stayed in place for 2 days before it rolled down and she had to take it off. She has compression stockings however states she could not get these on due to the swelling. She denies signs of infection. 4/13; patient with chronic venous insufficiency that has been in this clinic previously. She has a small pair of open areas on the left lower leg in the middle of previous scar tissue from I believe a previous wound. Most concerning today is how poor her edema control is above the sclerotic skin in her lower extremity. This is nonpitting and compatible with lymphedema. She tells me that she was using a juxta lite stocking although it was old we are apparently replacing this. She does not seem aware of how to adjust the tension with this and I think she is going to require 30/40 mmHg equivalent compression She has her arterial studies tomorrow at 2:00 4/25; patient presents for follow-up. ABIs done on 4/14 showed a left ABI of 1.04 and TBI of 0.99. She tolerated the compression wrap well This week. She states she is going to obtain compression stockings today. She has no issues or complaints today. 5/2; patient presents for follow-up. She states her  compression stockings are being delivered today. Her wound is closed today. READMISSION 03/05/2022 Despite having worn her juxta lite stockings, the patient reports that the area on her left medial lower leg reopened a couple of weeks ago. She says that she has been applying lotion and leaving it open to air to dry. She also had a new wound open in her left groin. She is not sure how this started, but says it may have been an ingrown hair or pimple. There is slough accumulation in both sites. The leg wound is fairly desiccated. Patient History  Information obtained from Patient. Allergies ACE Inhibitors, hydromorphone, vancomycin, ferrous sulfate Family History Diabetes - Mother,Maternal Grandparents, Heart Disease - Paternal Grandparents, Hypertension - Mother,Maternal Grandparents, Lung Disease - Father,Paternal Grandparents, No family history of Cancer, Hereditary Spherocytosis, Kidney Disease, Seizures, Stroke, Thyroid Problems, Tuberculosis. Social History Former smoker, Marital Status - Single, Alcohol Use - Never, Drug Use - No History, Caffeine Use - Daily. Medical History Eyes Denies history of Cataracts, Glaucoma, Optic Neuritis Ear/Nose/Mouth/Throat Denies history of Chronic sinus problems/congestion, Middle ear problems Hematologic/Lymphatic Patient has history of Lymphedema Denies history of Anemia, Hemophilia, Human Immunodeficiency Virus, Sickle Cell Disease Respiratory Patient has history of Sleep Apnea Denies history of Aspiration, Asthma, Chronic Obstructive Pulmonary Disease (COPD), Pneumothorax, Tuberculosis Cardiovascular Patient has history of Congestive Heart Failure, Coronary Artery Disease, Hypertension Denies history of Angina, Arrhythmia, Deep Vein Thrombosis, Hypotension, Myocardial Infarction, Peripheral Arterial Disease, Peripheral Venous Disease, Phlebitis, Vasculitis Gastrointestinal Denies history of Cirrhosis , Colitis, Crohnoos, Hepatitis A, Hepatitis  B, Hepatitis C Endocrine Patient has history of Type II Diabetes Denies history of Type I Diabetes Genitourinary Denies history of End Stage Renal Disease Immunological Denies history of Lupus Erythematosus, Raynaudoos, Scleroderma Integumentary (Skin) Denies history of History of Burn Musculoskeletal Denies history of Gout, Rheumatoid Arthritis, Osteoarthritis, Osteomyelitis Neurologic Denies history of Dementia, Neuropathy, Quadriplegia, Paraplegia, Seizure Disorder Oncologic [name], VARANO [name] ([id number]) [id number] Page 8 of 12 Denies history of Received Chemotherapy, Received Radiation Psychiatric Denies history of Anorexia/bulimia, Confinement Anxiety Patient is treated with Controlled Diet, Oral Agents. Blood sugar is tested. Review of Systems (ROS) Integumentary (Skin) Complains or has symptoms of Wounds - left abdomen and right leg. Objective Constitutional Tachycardic, asymptomatic.[name] No acute distress. Vitals Time Taken: 8:51 AM, Height: 69 in, Source: Stated, Weight: 300 lbs, Source: Stated, BMI: 44.3, Temperature: 98.2 F, Pulse: 112 bpm, Respiratory Rate: 20 breaths/min, Blood Pressure: 128/82 mmHg, Capillary Blood Glucose: 164 mg/dl. Respiratory Normal work of breathing on room air.. General Notes: 03/05/2022: On her left medial lower leg, there is scar tissue consistent with multiple prior episodes of wounding. There are several small open areas adjacent to each other. The surface has granulation tissue present, but is quite dry. There is slough and eschar present. In the left groin, there is a circular wound with thick slough present. No periwound erythema, induration, purulent drainage, or malodor. Integumentary (Hair, Skin) Wound #10 status is Open. Original cause of wound was Gradually Appeared. The date acquired was: 02/09/2022. The wound is located on the Left,Medial Lower Leg. The wound measures 5.4cm length x 1.1cm width x 0.1cm  depth; 4.665cm^2 area and 0.467cm^3 volume. There is Fat Layer (Subcutaneous Tissue) exposed. There is no tunneling or undermining noted. There is a small amount of serosanguineous drainage noted. The wound margin is distinct with the outline attached to the wound base. There is medium (34-66%) red, pink granulation within the wound bed. There is a medium (34-66%) amount of necrotic tissue within the wound bed including Adherent Slough. The periwound skin appearance exhibited: Scarring, Hemosiderin Staining. The periwound skin appearance did not exhibit: Callus, Crepitus, Excoriation, Induration, Rash, Dry/Scaly, Maceration, Atrophie Blanche, Cyanosis, Ecchymosis, Mottled, Pallor, Rubor, Erythema. Wound #9 status is Open. Original cause of wound was Not Known. The date acquired was: 03/02/2022. The wound is located on the Left Groin. The wound measures 0.9cm length x 1.6cm width x 0.1cm depth; 1.131cm^2 area and 0.113cm^3 volume. There is Fat Layer (Subcutaneous Tissue) exposed. There is no tunneling or undermining noted. There is a medium amount of serosanguineous drainage noted.  The wound margin is distinct with the outline attached to the wound base. There is medium (34-66%) red, pink granulation within the wound bed. There is a medium (34-66%) amount of necrotic tissue within the wound bed including Adherent Slough. The periwound skin appearance did not exhibit: Callus, Crepitus, Excoriation, Induration, Rash, Scarring, Dry/Scaly, Maceration, Atrophie Blanche, Cyanosis, Ecchymosis, Hemosiderin Staining, Mottled, Pallor, Rubor, Erythema. 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 There was a Selective/Open Wound Non-Viable Tissue Debridement with a total area of 4.4 sq cm performed by Fredirick Maudlin, MD. With the following instrument(s): Curette to remove Viable and Non-Viable tissue/material. Material removed includes Eschar and Slough and after achieving pain control using Lidocaine 5% topical ointment. A time out was conducted at 09:25, prior to the start of the procedure. A Minimum amount of bleeding was controlled with Pressure. The procedure was tolerated well with a pain level of 0 throughout and a pain level of 0 following the procedure. Post Debridement Measurements: 5.4cm length x 1.1cm width x 0.1cm depth; 0.467cm^3 volume. Character of Wound/Ulcer Post Debridement is improved. Severity of Tissue Post Debridement is: Fat layer exposed. Post procedure Diagnosis Wound #10: Same as Pre-Procedure Pre-procedure diagnosis of Wound #10 is a Diabetic Wound/Ulcer of the Lower Extremity located on the Left,Medial Lower Leg . There was a Four Layer Compression Therapy Procedure with a pre-treatment ABI of 1 by Deon Pilling, RN. Post procedure Diagnosis Wound #10: Same as Pre-Procedure [name], [name] ([id number]) [phone].pdf Page 9 of 12 Wound #9 Pre-procedure diagnosis of Wound #9 is an Abrasion located on the Left Groin . There was a Excisional Skin/Subcutaneous Tissue Debridement with a total area of 1.44 sq cm performed by Fredirick Maudlin, MD. With the following instrument(s): Curette to remove Viable and Non-Viable tissue/material. Material removed includes Subcutaneous Tissue and Slough and after achieving pain control using Lidocaine 5% topical ointment. A time out was conducted at 09:25, prior to the start of the  procedure. A Minimum amount of bleeding was controlled with Pressure. The procedure was tolerated well with a pain level of 0 throughout and a pain level of 0 following the procedure. Post Debridement Measurements: 0.9cm length x 1.6cm width x 0.1cm depth; 0.113cm^3 volume. Character of Wound/Ulcer Post Debridement is improved. Post procedure Diagnosis Wound #9: Same as Pre-Procedure Plan Follow-up Appointments: Return Appointment in 1 week. - Dr. Celine Ahr and [facility] [place] room 1 Tuesday 1030 03/12/2022 Anesthetic: (In clinic) Topical Lidocaine 5% applied to wound bed Bathing/ Shower/ Hygiene: May shower with protection but do not get wound dressing(s) wet. Edema Control - Lymphedema / SCD / Other: Elevate legs to the level of the heart or above for 30 minutes daily and/or when sitting, a frequency of: - 3-4 times a day throughout the day. Avoid standing for long periods of time. Exercise regularly The following medication(s) was prescribed: lidocaine topical 5 % cream cream topical applied only in clinic for debridements. was prescribed at facility WOUND #10: - Lower Leg Wound Laterality: Left, Medial Cleanser: Soap and Water 1 x Per Week/30 Days Discharge Instructions: May shower and  wash wound with dial antibacterial soap and water prior to dressing change. Cleanser: Wound Cleanser 1 x Per Week/30 Days Discharge Instructions: Cleanse the wound with wound cleanser prior to applying a clean dressing using gauze sponges, not tissue or cotton balls. Peri-Wound Care: Sween Lotion (Moisturizing lotion) 1 x Per Week/30 Days Discharge Instructions: Apply moisturizing lotion as directed Prim Dressing: Promogran Prisma Matrix, 4.34 (sq in) (silver collagen) 1 x Per Week/30 Days ary Discharge Instructions: Moisten collagen with hydrogel Secondary Dressing: ABD Pad, 8x10 1 x Per Week/30 Days Discharge Instructions: Apply over primary dressing as directed. Com pression Wrap: FourPress (4 layer compression  wrap) 1 x Per Week/30 Days Discharge Instructions: Apply four layer compression as directed. Apply first layer of unna boot to upper portion of lower leg. WOUND #9: - Groin Wound Laterality: Left Cleanser: Soap and Water 1 x Per Day/30 Days Discharge Instructions: May shower and wash wound with dial antibacterial soap and water prior to dressing change. Prim Dressing: KerraCel Ag Gelling Fiber Dressing, 4x5 in (silver alginate) 1 x Per Day/30 Days ary Discharge Instructions: Apply silver alginate to wound bed as instructed Secondary Dressing: Woven Gauze Sponge, Non-Sterile 4x4 in 1 x Per Day/30 Days Discharge Instructions: Apply over primary dressing as directed. 03/05/2022: On her left medial lower leg, there is scar tissue consistent with multiple prior episodes of wounding. There are several small open areas adjacent to each other. The surface has granulation tissue present, but is quite dry. There is slough and eschar present. In the left groin, there is a circular wound with thick slough present. No periwound erythema, induration, purulent drainage, or malodor. I used a curette to debride slough and eschar from the lower leg and slough and nonviable subcutaneous tissue from the groin. We will use Prisma silver collagen moistened with hydrogel and 4-layer compression on the leg and silver alginate on the groin. She will follow-up in 1 week. Electronic Signature(s) Signed: 03/05/2022 9:47:43 AM By: Fredirick Maudlin MD FACS Entered By: Fredirick Maudlin on 03/05/2022 09:47:42 -------------------------------------------------------------------------------- HxROS Details Patient Name: Date of Service: [name], Samanthajo [name]. 03/05/2022 9:00 A M Medical Record Number: [id number] Patient Account Number: [id number] Date of Birth/Sex: Treating RN: Oct 01, 1970 (51 y.o. [name] Primary Care Provider: [name] Other Clinician: Referring Provider: Treating Provider/Extender: Annice Needy in Treatment: 0 Information Obtained From AVIONA, MARTENSON [name] ([id number]) [id number] Page 10 of 12 Patient Integumentary (Skin) Complaints and Symptoms: Positive for: Wounds - left abdomen and right leg Medical History: Negative for: History of Burn Eyes Medical History: Negative for: Cataracts; Glaucoma; Optic Neuritis Ear/Nose/Mouth/Throat Medical History: Negative for: Chronic sinus problems/congestion; Middle ear problems Hematologic/Lymphatic Medical History: Positive for: Lymphedema Negative for: Anemia; Hemophilia; Human Immunodeficiency Virus; Sickle Cell Disease Respiratory Medical History: Positive for: Sleep Apnea Negative for: Aspiration; Asthma; Chronic Obstructive Pulmonary Disease (COPD); Pneumothorax; Tuberculosis Cardiovascular Medical History: Positive for: Congestive Heart Failure; Coronary Artery Disease; Hypertension Negative for: Angina; Arrhythmia; Deep Vein Thrombosis; Hypotension; Myocardial Infarction; Peripheral Arterial Disease; Peripheral Venous Disease; Phlebitis; Vasculitis Gastrointestinal Medical History: Negative for: Cirrhosis ; Colitis; Crohns; Hepatitis A; Hepatitis B; Hepatitis C Endocrine Medical History: Positive for: Type II Diabetes Negative for: Type I Diabetes Time with diabetes: 2002 Treated with: Oral agents, Diet Blood sugar tested every day: Yes Tested : TID Genitourinary Medical History: Negative for: End Stage Renal Disease Immunological Medical History: Negative for: Lupus Erythematosus; Raynauds; Scleroderma Musculoskeletal Medical History: Negative for: Gout; Rheumatoid Arthritis; Osteoarthritis; Osteomyelitis Neurologic Medical History: Negative for:  Dementia; Neuropathy; Quadriplegia; Paraplegia; Seizure Disorder Oncologic Medical History: Negative for: Received Chemotherapy; Received Radiation Psychiatric ARONDA, BURFORD [name] ([id number])  [id number] Page 11 of 12 Medical History: Negative for: Anorexia/bulimia; Confinement Anxiety Immunizations Pneumococcal Vaccine: Received Pneumococcal Vaccination: No Implantable Devices None Family and Social History Cancer: No; Diabetes: Yes - Mother,Maternal Grandparents; Heart Disease: Yes - Paternal Grandparents; Hereditary Spherocytosis: No; Hypertension: Yes - Mother,Maternal Grandparents; Kidney Disease: No; Lung Disease: Yes - Father,Paternal Grandparents; Seizures: No; Stroke: No; Thyroid Problems: No; Tuberculosis: No; Former smoker; Marital Status - Single; Alcohol Use: Never; Drug Use: No History; Caffeine Use: Daily; Financial Concerns: No; Food, Clothing or Shelter Needs: No; Support System Lacking: No; Transportation Concerns: No Electronic Signature(s) Signed: 03/05/2022 10:18:06 AM By: Fredirick Maudlin MD FACS Signed: 03/05/2022 5:13:10 PM By: Deon Pilling RN, BSN Entered By: Deon Pilling on 03/05/2022 08:53:49 -------------------------------------------------------------------------------- SuperBill Details Patient Name: Date of Service: [name], Celestia [name]. 03/05/2022 Medical Record Number: [id number] Patient Account Number: [id number] Date of Birth/Sex: Treating RN: [birth year] (51 y.o. F) Primary Care Provider: [name] Other Clinician: Referring Provider: Treating Provider/Extender: Annice Needy in Treatment: 0 Diagnosis Coding ICD-10 Codes Code Description [id number] Non-pressure chronic ulcer of other part of left lower leg with fat layer exposed L97.122 Non-pressure chronic ulcer of left thigh with fat layer exposed I50.30 Unspecified diastolic (congestive) heart failure I87.2 Venous insufficiency (chronic) (peripheral) I89.0 Lymphedema, not elsewhere classified E66.01 Morbid (severe) obesity due to excess calories Facility Procedures : CPT4 Code: 45809983 Description: [place]  VISIT-LEV 3 EST PT Modifier: Quantity: 1 : CPT4 Code: 38250539 Description: 11042 - DEB SUBQ TISSUE 20 SQ CM/< ICD-10 Diagnosis Description L97.122 Non-pressure chronic ulcer of left thigh with fat layer exposed Modifier: Quantity: 1 : CPT4 Code: [id number] Description: 79024 - DEBRIDE WOUND 1ST 20 SQ CM OR < ICD-10 Diagnosis Description L97.822 Non-pressure chronic ulcer of other part of left lower leg with fat layer expose Modifier: [name] Quantity: 1 Physician Procedures : CPT4 Code Description Modifier [id number] 99242 - WC PHYS LEVEL 4 - EST PT 25 ICD-10 Diagnosis Description L97.822 Non-pressure chronic ulcer of other part of left lower leg with fat layer exposed L97.122 Non-pressure chronic ulcer of left thigh with fat  layer exposed I50.30 Unspecified diastolic (congestive) heart failure E66.01 Morbid (severe) obesity due to excess calories CYNDE, MENARD [name] ([id number]) [id number] Quantity: 1 87.pdf Page 12 of 12 : [id number] 11042 - WC PHYS SUBQ TISS 20 SQ CM 1 ICD-10 Diagnosis Description J19.417 Non-pressure chronic ulcer of left thigh with fat layer exposed Quantity: : [id number] 18563 - WC PHYS DEBR WO ANESTH 20 SQ CM 1 ICD-10 Diagnosis Description L97.822 Non-pressure chronic ulcer of other part of left lower leg with fat layer exposed Quantity: Electronic Signature(s) Signed: 03/05/2022 10:18:06 AM By: Fredirick Maudlin MD FACS Signed: 03/05/2022 5:13:10 PM By: Deon Pilling RN, BSN Previous Signature: 03/05/2022 9:48:05 AM Version By: Fredirick Maudlin MD FACS Entered By: Deon Pilling on 03/05/2022 10:00:18

## 2022-03-05 NOTE — ED Triage Notes (Signed)
Pt reports that she was seen at her wound care clinic today and states that her HR was 122 while there. Pt also endorses CP and SHOB. H/x COPD.

## 2022-03-05 NOTE — ED Provider Triage Note (Signed)
Emergency Medicine Provider Triage Evaluation Note  [name] , a 51 y.o. female  was evaluated in triage.  Pt complains of shortness of breath, fever and URI symptoms.  Was being seen at wound care for a large wound on her left lower extremity and there she had a temperature of 101.  They thought that she could go home so she went home and then she started to feel short of breath, cough up phlegm and other URI symptoms.  Review of Systems  Positive:  Negative:   Physical Exam  BP (!) 144/97 (BP Location: Left Arm)   Pulse (!) 117   Temp 99.3 F (37.4 C) (Temporal)   Resp 20   Ht 5\' 2"  (1.575 m)   Wt (!) 146.5 kg   SpO2 99%   BMI 59.08 kg/m  Gen:   Awake, no distress   Resp:  Normal effort  MSK:   Moves extremities without difficulty  Other:  Did not unbandaged wound in triage  Medical Decision Making  Medically screening exam initiated at 4:36 PM.  Appropriate orders placed.  [name] was informed that the remainder of the evaluation will be completed by another provider, this initial triage assessment does not replace that evaluation, and the importance of remaining in the ED until their evaluation is complete.   COVID versus sepsis from wound.  Nursing staff understands she should be roomed soon.  Respiratory therapist did come and assess   Rhae Hammock, PA-C 03/05/22 1637

## 2022-03-05 NOTE — ED Notes (Signed)
RT to triage to evaluate pt

## 2022-03-06 ENCOUNTER — Ambulatory Visit: Payer: Medicaid Other | Admitting: Student

## 2022-03-06 DIAGNOSIS — I110 Hypertensive heart disease with heart failure: Secondary | ICD-10-CM | POA: Diagnosis not present

## 2022-03-06 DIAGNOSIS — Z886 Allergy status to analgesic agent status: Secondary | ICD-10-CM | POA: Diagnosis not present

## 2022-03-06 DIAGNOSIS — E785 Hyperlipidemia, unspecified: Secondary | ICD-10-CM | POA: Diagnosis not present

## 2022-03-06 DIAGNOSIS — Z888 Allergy status to other drugs, medicaments and biological substances status: Secondary | ICD-10-CM | POA: Diagnosis not present

## 2022-03-06 DIAGNOSIS — G4733 Obstructive sleep apnea (adult) (pediatric): Secondary | ICD-10-CM | POA: Diagnosis not present

## 2022-03-06 DIAGNOSIS — J101 Influenza due to other identified influenza virus with other respiratory manifestations: Secondary | ICD-10-CM | POA: Diagnosis not present

## 2022-03-06 DIAGNOSIS — J111 Influenza due to unidentified influenza virus with other respiratory manifestations: Secondary | ICD-10-CM | POA: Diagnosis not present

## 2022-03-06 DIAGNOSIS — Z6841 Body Mass Index (BMI) 40.0 and over, adult: Secondary | ICD-10-CM | POA: Diagnosis not present

## 2022-03-06 DIAGNOSIS — E119 Type 2 diabetes mellitus without complications: Secondary | ICD-10-CM | POA: Diagnosis not present

## 2022-03-06 DIAGNOSIS — J441 Chronic obstructive pulmonary disease with (acute) exacerbation: Secondary | ICD-10-CM

## 2022-03-06 DIAGNOSIS — Z881 Allergy status to other antibiotic agents status: Secondary | ICD-10-CM | POA: Diagnosis not present

## 2022-03-06 DIAGNOSIS — I2510 Atherosclerotic heart disease of native coronary artery without angina pectoris: Secondary | ICD-10-CM | POA: Diagnosis not present

## 2022-03-06 DIAGNOSIS — Z1152 Encounter for screening for COVID-19: Secondary | ICD-10-CM | POA: Diagnosis not present

## 2022-03-06 DIAGNOSIS — Z86718 Personal history of other venous thrombosis and embolism: Secondary | ICD-10-CM | POA: Diagnosis not present

## 2022-03-06 DIAGNOSIS — Z87891 Personal history of nicotine dependence: Secondary | ICD-10-CM | POA: Diagnosis not present

## 2022-03-06 DIAGNOSIS — I5032 Chronic diastolic (congestive) heart failure: Secondary | ICD-10-CM | POA: Diagnosis not present

## 2022-03-06 DIAGNOSIS — Z91041 Radiographic dye allergy status: Secondary | ICD-10-CM | POA: Diagnosis not present

## 2022-03-06 DIAGNOSIS — J4540 Moderate persistent asthma, uncomplicated: Secondary | ICD-10-CM | POA: Diagnosis not present

## 2022-03-06 MED ORDER — ALBUTEROL SULFATE (2.5 MG/3ML) 0.083% IN NEBU: 2.5000 mg | INHALATION_SOLUTION | RESPIRATORY_TRACT | Status: DC | PRN

## 2022-03-06 MED ORDER — DOCUSATE SODIUM 100 MG PO CAPS
100.0000 mg | ORAL_CAPSULE | Freq: Two times a day (BID) | ORAL | Status: DC
  Administered 2022-03-07 – 2022-03-09 (×3): 100 mg via ORAL
  Filled 2022-03-06 (×8): qty 1

## 2022-03-06 MED ORDER — PREDNISONE 20 MG PO TABS
40.0000 mg | ORAL_TABLET | Freq: Every day | ORAL | Status: DC
  Administered 2022-03-07 – 2022-03-09 (×3): 40 mg via ORAL
  Filled 2022-03-06 (×3): qty 2

## 2022-03-06 MED ORDER — IPRATROPIUM-ALBUTEROL 0.5-2.5 (3) MG/3ML IN SOLN
3.0000 mL | Freq: Three times a day (TID) | RESPIRATORY_TRACT | Status: DC
  Administered 2022-03-06 (×2): 3 mL via RESPIRATORY_TRACT
  Filled 2022-03-06 (×2): qty 3

## 2022-03-06 MED ORDER — IPRATROPIUM-ALBUTEROL 0.5-2.5 (3) MG/3ML IN SOLN
3.0000 mL | Freq: Four times a day (QID) | RESPIRATORY_TRACT | Status: DC
  Administered 2022-03-06 – 2022-03-11 (×17): 3 mL via RESPIRATORY_TRACT
  Filled 2022-03-06 (×17): qty 3

## 2022-03-06 MED ORDER — METHYLPREDNISOLONE SODIUM SUCC 125 MG IJ SOLR
80.0000 mg | Freq: Two times a day (BID) | INTRAMUSCULAR | Status: AC
  Administered 2022-03-06 – 2022-03-07 (×2): 80 mg via INTRAVENOUS
  Filled 2022-03-06 (×2): qty 2

## 2022-03-06 MED ORDER — GUAIFENESIN 100 MG/5ML PO LIQD
5.0000 mL | ORAL | Status: DC | PRN
  Administered 2022-03-06 – 2022-03-07 (×3): 5 mL via ORAL
  Filled 2022-03-06 (×3): qty 5

## 2022-03-06 NOTE — Progress Notes (Signed)
[name] ([id number]) [phone].pdf Page 1 of 4 Visit Report for 03/05/2022 Abuse Risk Screen Details Patient Name: Date of Service: [name], [name]. 03/05/2022 9:00 A M Medical Record Number: [id number] Patient Account Number: [id number] Date of Birth/Sex: Treating RN: 1970/10/20 (51 y.o. [name] Primary Care [name]: [name] Other Clinician: Referring [name]: Treating [name]/Extender: [name] in Treatment: 0 Abuse Risk Screen Items Answer ABUSE RISK SCREEN: Has anyone close to you tried to hurt or harm you recentlyo No Do you feel uncomfortable with anyone in your familyo No Has anyone forced you do things that you didnt want to doo No Electronic Signature(s) Signed: 03/05/2022 5:13:10 PM By: Megan Pilling RN, BSN Entered By: [name] on 03/05/2022 08:53:55 -------------------------------------------------------------------------------- Activities of Daily Living Details Patient Name: Date of Service: [name], [name]. 03/05/2022 9:00 A M Medical Record Number: [id number] Patient Account Number: [id number] Date of Birth/Sex: Treating RN: 27-Sep-1970 (51 y.o. [name] Primary Care Naziah Weckerly: [name] Other Clinician: Referring Rito Lecomte: Treating Jahyra Sukup/Extender: [name] in Treatment: 0 Activities of Daily Living Items Answer Activities of Daily Living (Please select one for each item) Drive Automobile Completely Able T Medications ake Completely Able Use T elephone Completely Able Care for Appearance Completely Able Use T oilet Completely Able Bath / Shower Completely Able Dress Self Completely Able Feed Self Completely Able Walk Completely Able Get In / Out Bed Completely Able Housework Completely Able Prepare Meals Completely Able Handle Money Completely Able Shop for Self Completely Able Electronic  Signature(s) Signed: 03/05/2022 5:13:10 PM By: Megan Pilling RN, BSN Entered By: [name] on 03/05/2022 08:54:09 [name] [name] ([id number]) [id number] Page 2 of 4 -------------------------------------------------------------------------------- Education Screening Details Patient Name: Date of Service: [name], [name]. 03/05/2022 9:00 A M Medical Record Number: [id number] Patient Account Number: [id number] Date of Birth/Sex: Treating RN: 09-11-1970 (51 y.o. [name] Primary Care Velton Roselle: [name] Other Clinician: Referring Tannya Gonet: Treating Dung Salinger/Extender: [name] in Treatment: 0 Primary Learner Assessed: Patient Learning Preferences/Education Level/Primary Language Learning Preference: Explanation, Demonstration, Printed Material Highest Education Level: High School Preferred Language: English Cognitive Barrier Language Barrier: No Translator Needed: No Memory Deficit: No Emotional Barrier: No Cultural/Religious Beliefs Affecting Medical Care: No Physical Barrier Impaired Vision: No Impaired Hearing: No Decreased Hand dexterity: No Knowledge/Comprehension Knowledge Level: High Comprehension Level: High Ability to understand written instructions: High Ability to understand verbal instructions: High Motivation Anxiety Level: Calm Cooperation: Cooperative Education Importance: Acknowledges Need Interest in Health Problems: Asks Questions Perception: Coherent Willingness to Engage in Self-Management High Activities: Readiness to Engage in Self-Management High Activities: Electronic Signature(s) Signed: 03/05/2022 5:13:10 PM By: Megan Pilling RN, BSN Entered By: [name] on 03/05/2022 08:54:25 -------------------------------------------------------------------------------- Fall Risk Assessment Details Patient Name: Date of Service: [name], [name]. 03/05/2022 9:00 A  M Medical Record Number: [id number] Patient Account Number: [id number] Date of Birth/Sex: Treating RN: 1970/07/09 (51 y.o. [name] Primary Care Daysen Gundrum: [name] Other Clinician: Referring Zyniah Ferraiolo: Treating Habeeb Puertas/Extender: [name] in Treatment: 0 Fall Risk Assessment Items Have you had 2 or more falls in the last 58 [name] ([id number]) [phone] [id number] Page 3 of 4 Have you had any fall that resulted in injury in the last 12 monthso 0 No FALLS RISK SCREEN History of falling - immediate or within 3 months 25 Yes Secondary diagnosis (Do you have 2 or  more medical diagnoseso) 0 No Ambulatory aid None/bed rest/wheelchair/nurse 0 Yes Crutches/cane/walker 0 No Furniture 0 No Intravenous therapy Access/Saline/Heparin Lock 0 No Gait/Transferring Normal/ bed rest/ wheelchair 0 No Weak (short steps with or without shuffle, stooped but able to lift head while walking, may seek 10 Yes support from furniture) Impaired (short steps with shuffle, may have difficulty arising from chair, head down, impaired 0 No balance) Mental Status Oriented to own ability 0 Yes Electronic Signature(s) Signed: 03/05/2022 5:13:10 PM By: Megan Pilling RN, BSN Entered By: [name] on 03/05/2022 08:54:35 -------------------------------------------------------------------------------- Foot Assessment Details Patient Name: Date of Service: [name], [name]. 03/05/2022 9:00 A M Medical Record Number: [id number] Patient Account Number: [id number] Date of Birth/Sex: Treating RN: 1971-04-25 (51 y.o. [name] Primary Care Clella Mckeel: [name] Other Clinician: Referring Leah Skora: Treating Selso Mannor/Extender: [name] in Treatment: 0 Foot Assessment Items Site Locations + = Sensation present, - = Sensation absent, C = Callus, U = Ulcer R = Redness, W = Warmth, M =  Maceration, PU = Pre-ulcerative lesion F = Fissure, S = Swelling, [name] = Dryness Assessment Right: Left: Other Deformity: No No Prior Foot Ulcer: No No Prior Amputation: No No Charcot Joint: No No Ambulatory Status: Ambulatory Without Help GaitZERENITY, [name] ([id number]) [phone].pdf Page 4 of 4 Electronic Signature(s) Signed: 03/05/2022 5:13:10 PM By: Megan Pilling RN, BSN Entered By: [name] on 03/05/2022 09:06:48 -------------------------------------------------------------------------------- Nutrition Risk Screening Details Patient Name: Date of Service: Megan Session [name]. 03/05/2022 9:00 A M Medical Record Number: [id number] Patient Account Number: [id number] Date of Birth/Sex: Treating RN: 10-01-1970 (51 y.o. [name], [name] Primary Care Allesandra Huebsch: [name] Other Clinician: Referring Evy Lutterman: Treating Sienna Stonehocker/Extender: [name] in Treatment: 0 Height (in): 69 Weight (lbs): 300 Body Mass Index (BMI): 44.3 Nutrition Risk Screening Items Score Screening NUTRITION RISK SCREEN: I have an illness or condition that made me change the kind and/or amount of food I eat 2 Yes I eat fewer than two meals per day 0 No I eat few fruits and vegetables, or milk products 0 No I have three or more drinks of beer, liquor or wine almost every day 0 No I have tooth or mouth problems that make it hard for me to eat 0 No I don't always have enough money to buy the food I need 0 No I eat alone most of the time 0 No I take three or more different prescribed or over-the-counter drugs a day 1 Yes Without wanting to, I have lost or gained 10 pounds in the last six months 0 No I am not always physically able to shop, cook and/or feed myself 0 No Nutrition Protocols Good Risk Protocol Provide education on elevated blood Moderate Risk Protocol 0 sugars and impact on wound healing, as applicable High Risk  Proctocol Risk Level: Moderate Risk Score: 3 Electronic Signature(s) Signed: 03/05/2022 5:13:10 PM By: Megan Pilling RN, BSN Entered By: [name] on 03/05/2022 08:54:45

## 2022-03-06 NOTE — Progress Notes (Signed)
Patient states she is having pain in the middle of her chest that comes and goes. She states it starts happening after she coughs. MD [name] notified.

## 2022-03-06 NOTE — H&P (Signed)
TRH H&P   Patient Demographics:    [name], is a 51 y.o. female  MRN: [id number]   DOB - [birth year]  Admit Date - 03/05/2022  Outpatient Primary MD for the patient is Ladell Pier, MD  Referring MD/NP/PA: PA Lavone Orn  Patient coming from: wound care center in [place]>> [facility]>> [facility]  Chief Complaint  Patient presents with   Shortness of Breath   Chest Pain      HPI:    [name]  is a 51 y.o. female, with medical history significant for lymphedema, iron deficiency anemia requiring IV iron transfusion, HFpEF, moderate persistent asthma, chronic hypoxia on 2 L, hypertension, morbid obesity, history of DVT on Eliquis. -Patient was sent from wound care center evaluation for wheezing, shortness of breath, patient had a visit at wound care center in [place] for chronic left lower extremity wound, which was bandaged, but she was noted to be febrile, with shortness of breath and wheezing, as well as tachycardic, so she was sent to [facility] ED for further evaluation. - in ED her work-up revealed influenza A positive, and she is with significant wheezing where there was a concern of COPD exacerbation, no evidence of volume overload on exam or imaging, she was treated with IV magnesium, nebulizer treatment, Solu-Medrol, and she was started on Tylenol for fever, and Tamiflu for her influenza, no bed availability in [place] so patient was accepted to [facility] for further management.      Review of systems:      A full 10 point Review of Systems was done, except as stated above, all other Review of Systems were negative.   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   Morbid obesity due to excess calories (HCC)    OSA (obstructive sleep apnea) 07/11/2021   Sleep study April 06, 2019 (Dr. Halford Chessman): Moderate OSA (AHI of 24.4 and SPO2 low of 77%).  Majority events during REM sleep.-recommend CPAP,  oral appliance or surgical. Reviewed sleep hygiene.  Avoid sedatives.     Osteoarthritis of left knee    Prediabetes    Small bowel obstruction ([place])    as a child   Speech impediment    Stutter / stammer      Past Surgical History:  Procedure Laterality Date   ABDOMINAL WALL DEFECT REPAIR  1970   IR CV LINE INJECTION  10/24/2020   IR REMOVAL TUN ACCESS W/ PORT W/O FL MOD SED  05/28/2021   IVC FILTER INSERTION  2017   Lower Extremity Venous Duplex  06/23/2020   No evidence of DVT or superficial thrombosis bilaterally.  No evidence of deep venous insufficiency bilaterally.  No evidence of SSV reflux.  Right GSV in the calf has reflux, no reflux in L GSV.;  Repeated in July 2020-no DVT   [place]  2014   TRANSTHORACIC ECHOCARDIOGRAM  05/18/2021   EF 60 to 65%.  No RWMA.  Mild concentric LVH.  "Normal diastolic parameters ".  Normal longitudinal strain.  Normal PAP, RAP.  Normal aortic and mitral valves.==>  In July 2022, echo read as GR 1 DD otherwise stable.      Social History:     Social History   Tobacco Use   Smoking status: Former   Smokeless tobacco: Never  Substance Use Topics   Alcohol use: Not Currently       Family History :     Family History  Problem Relation Age of Onset   Diabetes Mellitus II Mother    COPD Father    Diabetes Father    Diabetes Mellitus II Maternal Grandmother    Breast cancer Paternal Grandfather       Home Medications:   Prior to Admission medications   Medication Sig Start Date End Date Taking? Authorizing Provider  amLODipine (NORVASC) 5 MG tablet TAKE 1/2 TABLET BY MOUTH DAILY 02/19/22  Yes Ladell Pier, MD  Accu-Chek Softclix Lancets lancets Use as instructed Patient taking differently: 1 each by Other route See admin instructions. Use as instructed 11/29/20   Argentina Donovan, PA-C  acetaminophen (TYLENOL) 500 MG tablet Take 1 tablet (500 mg total) by mouth every 6 (six) hours as needed. Patient taking differently: Take 500 mg by mouth every 6 (six) hours as needed for mild pain. 04/19/20   Ladell Pier, MD  acetaminophen (TYLENOL) 650 MG CR tablet     [provider]  albuterol (PROAIR HFA) 108 (90 Base) MCG/ACT inhaler INHALE TWO PUFFS BY MOUTH EVERY 6 HOURS AS NEEDED FOR WHEEZING OR SHORTNESS OF BREATH Patient taking differently: Inhale 2 puffs into the lungs See admin instructions. INHALE TWO PUFFS BY MOUTH EVERY 6 HOURS AS NEEDED FOR WHEEZING OR SHORTNESS OF BREATH 10/05/20   Ladell Pier, MD  apixaban (ELIQUIS) 5 MG TABS tablet Take 1 tablet (5 mg total) by mouth 2 (two) times daily. 11/22/21   Ladell Pier, MD  atorvastatin (LIPITOR) 40 MG tablet Take 1 tablet (40 mg total) by mouth daily. 11/22/21   Ladell Pier, MD  Blood Glucose Calibration (ACCU-CHEK GUIDE CONTROL) LIQD 1 each by In Vitro route daily. 12/13/20   Argentina Donovan, PA-C  Blood Glucose Monitoring Suppl (ACCU-CHEK GUIDE) w/Device  KIT 1 each by Does not apply route 2 (two) times daily. 11/29/20   Argentina Donovan, PA-C  busPIRone (BUSPAR) 7.5 MG tablet Take 1 tablet by mouth  2 (two) times daily.    [provider]  Carboxymethylcellulose Sodium (EYE DROPS OP) Place 1 drop into both eyes daily as needed (dry eyes).    [provider]  carvedilol (COREG) 25 MG tablet Take 1 tablet (25 mg total) by mouth 2 (two) times daily with a meal. 01/07/22   Ladell Pier, MD  cholecalciferol (VITAMIN D3) 25 MCG (1000 UNIT) tablet Take 1 tablet (1,000 Units total) by mouth daily. 04/07/20   Ladell Pier, MD  COVID-19 mRNA Vac-TriS, Pfizer, (PFIZER-BIONT COVID-19 VAC-TRIS) SUSP injection Inject into the muscle. 06/26/21   Carlyle Basques, MD  diphenhydrAMINE (BENADRYL) 50 MG tablet Take 1 tablet (50 mg total) by mouth once for 1 dose. Pt to take 50 mg of benadryl on 11/06/21 at 7:30 AM. Please call [phone] with any questions. 10/31/21 10/31/21  Nelson Chimes, MD  FARXIGA 10 MG TABS tablet TAKE 1 TABLET BY MOUTH DAILY 02/11/22   Ladell Pier, MD  FEROSUL 325 (65 Fe) MG tablet TAKE ONE TABLET BY MOUTH DAILY WITH BREAKFAST 06/06/21   Ladell Pier, MD  furosemide (LASIX) 80 MG tablet Take 1 tablet (80 mg total) by mouth daily. Take additional dose of lasix for weight gain, leg swelling 06/01/21   Barb Merino, MD  gabapentin (NEURONTIN) 100 MG capsule Take 1 capsule (100 mg total) by mouth 3 (three) times daily. 01/07/22   Ladell Pier, MD  glucose blood test strip Use as instructed Patient taking differently: 1 each by Other route See admin instructions. Use as instructed 11/29/20   Argentina Donovan, PA-C  hydrocortisone cream 1 % Apply 1 application topically daily as needed for itching.    [provider]  hydrOXYzine (VISTARIL) 25 MG capsule Take 1 capsule (25 mg total) by mouth at bedtime as needed. Patient taking differently: Take 25 mg by mouth at bedtime as needed for anxiety or itching.  04/20/21   Ladell Pier, MD  methocarbamol (ROBAXIN) 500 MG tablet Take 1 tablet (500 mg total) by mouth 2 (two) times daily as needed for muscle spasms. 01/07/22   Ladell Pier, MD  mometasone-formoterol (DULERA) 200-5 MCG/ACT AERO INHALE 2 PUFFS BY MOUTH EVERY MORNING AND INHALE TWO PUFFS BY MOUTH EVERY NIGHT AT BEDTIME 01/29/22   Ladell Pier, MD  nitroGLYCERIN (NITROSTAT) 0.4 MG SL tablet Place 1 tablet (0.4 mg total) under the tongue every 5 (five) minutes as needed for chest pain. 11/22/21   Ladell Pier, MD  omeprazole (PRILOSEC) 20 MG capsule Take 1 capsule (20 mg total) by mouth daily as needed (acid reflux). 05/01/21 09/18/21  Fenton Foy, NP  PARoxetine (PAXIL) 20 MG tablet Take 1 tablet (20 mg total) by mouth daily. 11/22/21   Ladell Pier, MD  potassium chloride SA (KLOR-CON M20) 20 MEQ tablet Take 1 tablet (20 mEq total) by mouth daily. 11/22/21   Ladell Pier, MD  predniSONE (DELTASONE) 50 MG tablet Pt to take 50 mg of prednisone on 11/05/21 at 7:30 PM, 50 mg of prednisone on 11/06/21 at 1:30 AM, and 50 mg of prednisone on 11/06/21 at 7:30 AM. Pt is also to take 50 mg of benadryl on 11/06/21 at 7:30 AM. Please call [phone] with any questions. Patient not taking: Reported on 01/29/2022 10/31/21   Nelson Chimes, MD  Semaglutide, 1 MG/DOSE, 4 MG/3ML SOPN Inject 1 mg as directed once a week. 02/04/22   Ladell Pier, MD  spironolactone (ALDACTONE) 25 MG tablet Take 1  tablet (25 mg total) by mouth daily. 01/07/22   Ladell Pier, MD  valsartan (DIOVAN) 160 MG tablet Take 1 tablet (160 mg total) by mouth daily. 02/14/21   Leonie Man, MD  vitamin B-12 (CYANOCOBALAMIN) 1000 MCG tablet Take 1,000 mcg by mouth daily.    [provider]     Allergies:     Allergies  Allergen Reactions   Ace Inhibitors Rash and Other (See Comments)    Make pt bleed   Aspirin Other (See Comments)    Per patient paperwork: blood clot?  Likely because of  chronic DOAC   Hydromorphone Hives and Itching   Vancomycin Itching and Rash   Contrast Media [Iodinated Contrast Media] Hives   Dilaudid [Hydromorphone Hcl] Hives   Lidocaine Itching    Itching with Lidocaine patch reported 06/13/2021 vis telephone message.     Physical Exam:   Vitals  Blood pressure (!) 142/98, pulse 99, temperature 98.7 F (37.1 C), resp. rate 19, height _0  (1.575 m), weight (!) 146.5 kg, SpO2 98 %.   1. General developed female, laying in bed, no apparent distress  2. Normal affect and insight, Not Suicidal or Homicidal, Awake Alert, Oriented X 3.  3. No F.N deficits, ALL C.Nerves Intact, Strength 5/5 all 4 extremities, Sensation intact all 4 extremities, Plantars down going.  4. Ears and Eyes appear Normal, Conjunctivae clear, PERRLA. Moist Oral Mucosa.  5. Supple Neck, No JVD, No cervical lymphadenopathy appriciated, No Carotid Bruits.  6. Symmetrical Chest wall movement, scattered wheezing bilaterally.  7. RRR, No Gallops, Rubs or Murmurs, No Parasternal Heave.  8. Positive Bowel Sounds, Abdomen Soft, No tenderness, No organomegaly appriciated,No rebound -guarding or rigidity.  9.  No Cyanosis, Normal Skin Turgor, lower extremity bandaged with Unna boot  10. Good muscle tone,  joints appear normal , no effusions, Normal ROM.     Data Review:    CBC Recent Labs  Lab 03/05/22 1618  WBC 4.0  HGB 11.1*  HCT 37.6  PLT 106*  MCV 92.6  MCH 27.3  MCHC 29.5*  RDW 14.0   ------------------------------------------------------------------------------------------------------------------  Chemistries  Recent Labs  Lab 03/05/22 1618 03/05/22 1859  NA 140  --   K 3.3*  --   CL 102  --   CO2 29  --   GLUCOSE 91  --   BUN 7  --   CREATININE 0.74  --   CALCIUM 9.2  --   MG  --  1.8   ------------------------------------------------------------------------------------------------------------------ estimated creatinine clearance is 113.9  mL/min (by C-G formula based on SCr of 0.74 mg/dL). ------------------------------------------------------------------------------------------------------------------ No results for input(s): "TSH", "T4TOTAL", "T3FREE", "THYROIDAB" in the last 72 hours.  Invalid input(s): "FREET3"  Coagulation profile No results for input(s): "INR", "PROTIME" in the last 168 hours. ------------------------------------------------------------------------------------------------------------------- No results for input(s): "DDIMER" in the last 72 hours. -------------------------------------------------------------------------------------------------------------------  Cardiac Enzymes No results for input(s): "CKMB", "TROPONINI", "MYOGLOBIN" in the last 168 hours.  Invalid input(s): "CK" ------------------------------------------------------------------------------------------------------------------    Component Value Date/Time   BNP 68.2 03/05/2022 1618     ---------------------------------------------------------------------------------------------------------------  Urinalysis    Component Value Date/Time   COLORURINE COLORLESS (A) 03/05/2022 1859   APPEARANCEUR CLEAR 03/05/2022 1859   APPEARANCEUR Clear 01/03/2022 1531   LABSPEC 1.005 03/05/2022 1859   PHURINE 6.5 03/05/2022 1859   GLUCOSEU NEGATIVE 03/05/2022 1859   HGBUR NEGATIVE 03/05/2022 1859   BILIRUBINUR NEGATIVE 03/05/2022 1859   BILIRUBINUR Negative 01/03/2022 1531   BILIRUBINUR negative 01/03/2022 1510   KETONESUR  NEGATIVE 03/05/2022 1859   PROTEINUR NEGATIVE 03/05/2022 1859   UROBILINOGEN 2.0 (A) 01/03/2022 1510   NITRITE NEGATIVE 03/05/2022 1859   LEUKOCYTESUR NEGATIVE 03/05/2022 1859    ----------------------------------------------------------------------------------------------------------------   Imaging Results:    DG Chest Portable 1 View  Result Date: 03/05/2022 CLINICAL DATA:  Shortness of breath. EXAM: PORTABLE  CHEST 1 VIEW COMPARISON:  February 07, 2022. FINDINGS: The heart size and mediastinal contours are within normal limits. Both lungs are clear. The visualized skeletal structures are unremarkable. IMPRESSION: No active disease. Electronically Signed   By: Marijo Conception M.D.   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 -Continue with Tamiflu -He is with significant wheezing bilaterally, continue with IV steroids, DuoNebs and as needed albuterol -Encouraged use incentive spirometry and flutter valve  Chronic diastolic CHF -Recent echo in January 2023 with a preserved EF -No evidence of volume overload, continue with home dose Lasix -Continue with Farxiga, Aldactone  Hypertension  -continue with home medication including Coreg, Aldactone  Morbid obesity -With BMI of 59 -She is on semaglutide, will hold during hospital stay, can be resumed upon discharge  Hypokalemika -repleted    History of DVT  - Continue with Eliquis  Left lower extremity wound -This was just seen and evaluated yesterday at the wound clinic, it was bandaged with recommendation to follow-up in 1 week for dressing change, so we will keep dressing intact where she is to follow next week with her wound  clinic.     DVT Prophylaxis Eliquis   AM Labs Ordered, also please review Full Orders  Family Communication: Admission, patients condition and plan of care including tests being ordered have been discussed with the patient  who indicate understanding and agree with the plan and Code Status.  Code Status full  Likely DC to  home  Condition GUARDED    Consults called: none    Admission status: observation    Time spent in minutes : 60 minutes   Phillips Climes M.D on 03/06/2022 at 3:28 PM   [facility] - Office  [phone]

## 2022-03-06 NOTE — Progress Notes (Signed)
AIRAM, RUNIONS [name] ([id number]) [id number] Page 1 of 12 Visit Report for 03/05/2022 Allergy List Details Patient Name: Date of Service: [name], [name]. 03/05/2022 9:00 A M Medical Record Number: [id number] Patient Account Number: [id number] Date of Birth/Sex: Treating RN: 1970-05-11 (51 y.o. [name] Primary Care Provider: [name] Other Clinician: Referring Provider: Treating Provider/Extender: Annice Needy in Treatment: 0 Allergies Active Allergies ACE Inhibitors hydromorphone vancomycin ferrous sulfate Allergy Notes Electronic Signature(s) Signed: 03/05/2022 5:13:10 PM By: Deon Pilling RN, BSN Entered By: Deon Pilling on 03/05/2022 08:52:44 -------------------------------------------------------------------------------- Arrival Information Details Patient Name: Date of Service: [name], [name]. 03/05/2022 9:00 A M Medical Record Number: [id number] Patient Account Number: [id number] Date of Birth/Sex: Treating RN: May 15, 1970 (51 y.o. [name], [name] Primary Care Provider: [name] Other Clinician: Referring Provider: Treating Provider/Extender: Annice Needy in Treatment: 0 Visit Information Patient Arrived: Ambulatory Arrival Time: 08:50 Accompanied By: self Transfer Assistance: None Patient Identification Verified: Yes Secondary Verification Process Completed: Yes Patient Requires Transmission-Based Precautions: No Patient Has Alerts: Yes Patient Alerts: Patient on Blood Thinner ABI R 0.99 08/10/2021 ABI L 1.04 08/10/2021 History Since Last Visit Added or deleted any medications: No Any new allergies or adverse reactions: No Had a fall or experienced change in activities of daily living that may affect risk of falls: No Signs or symptoms of abuse/neglect since last visito No Hospitalized since last visit: No Implantable device outside of the clinic  excluding cellular tissue based products placed in the center since last visit: No Has Compression in Place as Prescribed: Yes Electronic Signature(s) Signed: 03/05/2022 5:13:10 PM By: Deon Pilling RN, BSN Entered By: Deon Pilling on 03/05/2022 08:55:48 [name] [name] ([id number]) [id number] Page 2 of 12 -------------------------------------------------------------------------------- Clinic Level of Care Assessment Details Patient Name: Date of Service: [name], [name]. 03/05/2022 9:00 A M Medical Record Number: [id number] Patient Account Number: [id number] Date of Birth/Sex: Treating RN: October 23, 1970 (51 y.o. [name] Primary Care Provider: [name] Other Clinician: Referring Provider: Treating Provider/Extender: Annice Needy in Treatment: 0 Clinic Level of Care Assessment Items TOOL 1 Quantity Score X- 1 0 Use when EandM and Procedure is performed on INITIAL visit ASSESSMENTS - Nursing Assessment / Reassessment X- 1 20 General Physical Exam (combine w/ comprehensive assessment (listed just below) when performed on new pt. evals) X- 1 25 Comprehensive Assessment (HX, ROS, Risk Assessments, Wounds Hx, etc.) ASSESSMENTS - Wound and Skin Assessment / Reassessment X- 1 10 Dermatologic / Skin Assessment (not related to wound area) ASSESSMENTS - Ostomy and/or Continence Assessment and Care [] - 0 Incontinence Assessment and Management [] - 0 Ostomy Care Assessment and Management (repouching, etc.) PROCESS - Coordination of Care [] - 0 Simple Patient / Family Education for ongoing care X- 1 20 Complex (extensive) Patient / Family Education for ongoing care X- 1 10 Staff obtains Programmer, systems, Records, T Results / Process Orders est [] - 0 Staff telephones HHA, Nursing Homes / Clarify orders / etc [] - 0 Routine Transfer to another Facility (non-emergent condition) [] - 0 Routine Hospital Admission  (non-emergent condition) X- 1 15 New Admissions / Biomedical engineer / Ordering NPWT Apligraf, etc. , [] - 0 Emergency Hospital Admission (emergent condition) PROCESS - Special Needs [] - 0 Pediatric / Minor Patient Management [] - 0 Isolation Patient Management [] - 0 Hearing / Language / Visual special needs [] - 0 Assessment of Community assistance (transportation, [name]/C planning, etc.) [] -  0 Additional assistance / Altered mentation [] - 0 Support Surface(s) Assessment (bed, cushion, seat, etc.) INTERVENTIONS - Miscellaneous [] - 0 External ear exam [] - 0 Patient Transfer (multiple staff / Civil Service fast streamer / Similar devices) [] - 0 Simple Staple / Suture removal (25 or less) [] - 0 Complex Staple / Suture removal (26 or more) [] - 0 Hypo/Hyperglycemic Management (do not check if billed separately) [] - 0 Ankle / Brachial Index (ABI) - do not check if billed separately Has the patient been seen at the hospital within the last three years: Yes Total Score: 100 Level Of Care: New/Established - Level 3 Electronic Signature(s) Signed: 03/05/2022 5:13:10 PM By: Deon Pilling RN, BSN [name], [name] ([id number]) By: Deon Pilling RN, BSN [phone].pdf Page 3 of 12 Signed: 03/05/2022 5:13:10 PM Entered By: Deon Pilling on 03/05/2022 09:34:38 -------------------------------------------------------------------------------- Compression Therapy Details Patient Name: Date of Service: [name], [name]. 03/05/2022 9:00 A M Medical Record Number: [id number] Patient Account Number: [id number] Date of Birth/Sex: Treating RN: [birth year] (51 y.o. [name] Primary Care Provider: [name] Other Clinician: Referring Provider: Treating Provider/Extender: Annice Needy in Treatment: 0 Compression Therapy Performed for Wound Assessment: Wound #10 Left,Medial Lower Leg Performed By: Clinician Deon Pilling,  RN Compression Type: Four Layer Pre Treatment ABI: 1 Post Procedure Diagnosis Same as Pre-procedure Electronic Signature(s) Signed: 03/05/2022 5:13:10 PM By: Deon Pilling RN, BSN Entered By: Deon Pilling on 03/05/2022 09:29:18 -------------------------------------------------------------------------------- Encounter Discharge Information Details Patient Name: Date of Service: [name], Shadara [name]. 03/05/2022 9:00 A M Medical Record Number: [id number] Patient Account Number: [id number] Date of Birth/Sex: Treating RN: Jan 29, 1971 (51 y.o. [name], Tammi Klippel Primary Care Provider: [name] Other Clinician: Referring Provider: Treating Provider/Extender: Annice Needy in Treatment: 0 Encounter Discharge Information Items Post Procedure Vitals Discharge Condition: Stable Temperature (F): 98.2 Ambulatory Status: Ambulatory Pulse (bpm): 112 Discharge Destination: Home Respiratory Rate (breaths/min): 20 Transportation: Private Auto Blood Pressure (mmHg): 128/82 Accompanied By: self Schedule Follow-up Appointment: Yes Clinical Summary of Care: Electronic Signature(s) Signed: 03/05/2022 5:13:10 PM By: Deon Pilling RN, BSN Entered By: Deon Pilling on 03/05/2022 09:35:55 -------------------------------------------------------------------------------- Lower Extremity Assessment Details Patient Name: Date of Service: Megan Session [name]. 03/05/2022 9:00 A M Medical Record Number: [id number] Patient Account Number: [id number] Date of Birth/Sex: Treating RN: 08-11-1970 (51 y.o. [address] [place], [place] [name] ([id number]) [id number] Page 4 of 12 Primary Care Provider: [name] Other Clinician: Referring Provider: Treating Provider/Extender: Annice Needy in Treatment: 0 Edema Assessment Assessed: [name]: Yes] [Right: No] Edema: [Left: Ye] [Right: s] Calf Left: Right: Point of Measurement: 28  cm From Medial Instep 41 cm Ankle Left: Right: Point of Measurement: 12 cm From Medial Instep 25 cm Knee To Floor Left: Right: From Medial Instep 39 cm Vascular Assessment Pulses: Dorsalis Pedis Palpable: [Left:Yes] Electronic Signature(s) Signed: 03/05/2022 5:13:10 PM By: Deon Pilling RN, BSN Entered By: Deon Pilling on 03/05/2022 09:06:29 -------------------------------------------------------------------------------- Multi Wound Chart Details Patient Name: Date of Service: [name], Malayja [name]. 03/05/2022 9:00 A M Medical Record Number: [id number] Patient Account Number: [id number] Date of Birth/Sex: Treating RN: [birth year] (51 y.o. F) Primary Care Provider: [name] Other Clinician: Referring Provider: Treating Provider/Extender: Annice Needy in Treatment: 0 Vital Signs Height(in): 69 Capillary Blood Glucose(mg/dl): 164 Weight(lbs): 300 Pulse(bpm): 112 Body Mass Index(BMI): 44.3 Blood Pressure(mmHg): 128/82 Temperature(F): 98.2 Respiratory Rate(breaths/min): 20 [10:Photos:] [N/A:N/A] Left, Medial Lower Leg Left Groin N/A Wound  Location: Gradually Appeared Not Known N/A Wounding Event: Diabetic Wound/Ulcer of the Lower Abrasion N/A Primary Etiology: Extremity Lymphedema N/A N/A Secondary Etiology: Lymphedema, Sleep Apnea, Lymphedema, Sleep Apnea, N/A Comorbid HistoryMarland Kitchen SUMAIYAH, MARKERT ([id number]) [phone].pdf Page 5 of 12 Congestive Heart Failure, CoronaryCongestive Heart Failure, Coronary Artery Disease, Hypertension, Type II Artery Disease, Hypertension, Type II Diabetes Diabetes 02/09/2022 03/02/2022 N/A Date Acquired: 0 0 N/A Weeks of Treatment: Open Open N/A Wound Status: No No N/A Wound Recurrence: Yes No N/A Clustered Wound: 2 N/A N/A Clustered Quantity: 5.4x1.1x0.1 0.9x1.6x0.1 N/A Measurements L x W x [name] (cm) 4.665 1.131 N/A A (cm) : rea 0.467 0.113 N/A Volume (cm) : Grade 1  Full Thickness Without Exposed N/A Classification: Support Structures Small Medium N/A Exudate A mount: Serosanguineous Serosanguineous N/A Exudate Type: red, brown red, brown N/A Exudate Color: Distinct, outline attached Distinct, outline attached N/A Wound Margin: Medium (34-66%) Medium (34-66%) N/A Granulation A mount: Red, Pink Red, Pink N/A Granulation Quality: Medium (34-66%) Medium (34-66%) N/A Necrotic A mount: Fat Layer (Subcutaneous Tissue): Yes Fat Layer (Subcutaneous Tissue): Yes N/A Exposed Structures: Fascia: No Fascia: No Tendon: No Tendon: No Muscle: No Muscle: No Joint: No Joint: No Bone: No Bone: No Small (1-33%) Small (1-33%) N/A Epithelialization: Debridement - Selective/Open Wound Debridement - Excisional N/A Debridement: Pre-procedure Verification/Time Out 09:25 09:25 N/A Taken: Lidocaine 5% topical ointment Lidocaine 5% topical ointment N/A Pain Control: Necrotic/Eschar, Slough Subcutaneous, Slough N/A Tissue Debrided: Non-Viable Tissue Skin/Subcutaneous Tissue N/A Level: 4.4 1.44 N/A Debridement A (sq cm): rea Curette Curette N/A Instrument: Minimum Minimum N/A Bleeding: Pressure Pressure N/A Hemostasis A chieved: 0 0 N/A Procedural Pain: 0 0 N/A Post Procedural Pain: Procedure was tolerated well Procedure was tolerated well N/A Debridement Treatment Response: 5.4x1.1x0.1 0.9x1.6x0.1 N/A Post Debridement Measurements L x W x [name] (cm) 0.467 0.113 N/A Post Debridement Volume: (cm) Scarring: Yes Excoriation: No N/A Periwound Skin Texture: Excoriation: No Induration: No Induration: No Callus: No Callus: No Crepitus: No Crepitus: No Rash: No Rash: No Scarring: No Maceration: No Maceration: No N/A Periwound Skin Moisture: Dry/Scaly: No Dry/Scaly: No Hemosiderin Staining: Yes Atrophie Blanche: No N/A Periwound Skin Color: Atrophie Blanche: No Cyanosis: No Cyanosis: No Ecchymosis: No Ecchymosis: No Erythema:  No Erythema: No Hemosiderin Staining: No Mottled: No Mottled: No Pallor: No Pallor: No Rubor: No Rubor: No Compression Therapy Debridement N/A Procedures Performed: Debridement Treatment Notes Wound #10 (Lower Leg) Wound Laterality: Left, Medial Cleanser Soap and Water Discharge Instruction: May shower and wash wound with dial antibacterial soap and water prior to dressing change. Wound Cleanser Discharge Instruction: Cleanse the wound with wound cleanser prior to applying a clean dressing using gauze sponges, not tissue or cotton balls. Peri-Wound Care Sween Lotion (Moisturizing lotion) Discharge Instruction: Apply moisturizing lotion as directed Topical Primary Dressing Promogran Prisma Matrix, 4.34 (sq in) (silver collagen) Discharge Instruction: Moisten collagen with hydrogel [name], [name] ([id number]) [id number] Page 6 of 12 Secondary Dressing ABD Pad, 8x10 Discharge Instruction: Apply over primary dressing as directed. Secured With Compression Wrap FourPress (4 layer compression wrap) Discharge Instruction: Apply four layer compression as directed. May also use Miliken CoFlex 2 layer compression system as alternative. Compression Stockings Add-Ons Wound #9 (Groin) Wound Laterality: Left Cleanser Soap and Water Discharge Instruction: May shower and wash wound with dial antibacterial soap and water prior to dressing change. Peri-Wound Care Topical Primary Dressing KerraCel Ag Gelling Fiber Dressing, 4x5 in (silver alginate) Discharge Instruction: Apply silver alginate to wound bed as instructed Secondary Dressing Woven Gauze Sponge,  Non-Sterile 4x4 in Discharge Instruction: Apply over primary dressing as directed. Secured With Compression Wrap Compression Stockings Environmental education officer) Signed: 03/05/2022 9:39:59 AM By: Fredirick Maudlin MD FACS Entered By: Fredirick Maudlin on 03/05/2022  09:39:59 -------------------------------------------------------------------------------- Multi-Disciplinary Care Plan Details Patient Name: Date of Service: [name], Athalie [name]. 03/05/2022 9:00 A M Medical Record Number: [id number] Patient Account Number: [id number] Date of Birth/Sex: Treating RN: 1971-01-22 (51 y.o. [name] Primary Care Provider: [name] Other Clinician: Referring Provider: Treating Provider/Extender: Annice Needy in Treatment: 0 Active Inactive Orientation to the Wound Care Program Nursing Diagnoses: Knowledge deficit related to the wound healing center program Goals: Patient/caregiver will verbalize understanding of the [facility] Program Date Initiated: 03/05/2022 Target Resolution Date: 04/05/2022 Goal Status: Active Interventions: [name], [name] ([id number]) [phone].pdf Page 7 of 12 Provide education on orientation to the wound center Notes: Pain, Acute or Chronic Nursing Diagnoses: Pain, acute or chronic: actual or potential Potential alteration in comfort, pain Goals: Patient will verbalize adequate pain control and receive pain control interventions during procedures as needed Date Initiated: 03/05/2022 Target Resolution Date: 04/05/2022 Goal Status: Active Patient/caregiver will verbalize comfort level met Date Initiated: 03/05/2022 Target Resolution Date: 04/05/2022 Goal Status: Active Interventions: Encourage patient to take pain medications as prescribed Provide education on pain management Treatment Activities: Administer pain control measures as ordered : 03/05/2022 Notes: Wound/Skin Impairment Nursing Diagnoses: Knowledge deficit related to ulceration/compromised skin integrity Goals: Patient/caregiver will verbalize understanding of skin care regimen Date Initiated: 03/05/2022 Target Resolution Date: 04/05/2022 Goal Status: Active Interventions: Assess  patient/caregiver ability to obtain necessary supplies Assess patient/caregiver ability to perform ulcer/skin care regimen upon admission and as needed Provide education on ulcer and skin care Treatment Activities: Skin care regimen initiated : 03/05/2022 Topical wound management initiated : 03/05/2022 Notes: Electronic Signature(s) Signed: 03/05/2022 5:13:10 PM By: Deon Pilling RN, BSN Entered By: Deon Pilling on 03/05/2022 09:28:34 -------------------------------------------------------------------------------- Pain Assessment Details Patient Name: Date of Service: [name], Kiylah [name]. 03/05/2022 9:00 A M Medical Record Number: [id number] Patient Account Number: [id number] Date of Birth/Sex: Treating RN: Nov 17, 1970 (51 y.o. [name] Primary Care Provider: [name] Other Clinician: Referring Provider: Treating Provider/Extender: Annice Needy in Treatment: 0 Active Problems Location of Pain Severity and Description of Pain Patient Has Paino Yes Site Locations Pain Location: [name], [name] ([id number]) [phone].pdf Page 8 of 12 Pain Location: Pain in Ulcers Rate the pain. Current Pain Level: 9 Pain Management and Medication Current Pain Management: Medication: No Cold Application: No Rest: No Massage: No Activity: No T.E.N.S.: No Heat Application: No Leg drop or elevation: No Is the Current Pain Management Adequate: Adequate How does your wound impact your activities of daily livingo Sleep: No Bathing: No Appetite: No Relationship With Others: No Bladder Continence: No Emotions: No Bowel Continence: No Work: No Toileting: No Drive: No Dressing: No Hobbies: No Engineer, maintenance) Signed: 03/05/2022 5:13:10 PM By: Deon Pilling RN, BSN Entered By: Deon Pilling on 03/05/2022 08:55:05 -------------------------------------------------------------------------------- Patient/Caregiver Education  Details Patient Name: Date of Service: Megan Session [name]. 11/7/2023andnbsp9:00 A M Medical Record Number: [id number] Patient Account Number: [id number] Date of Birth/Gender: Treating RN: 1970-06-11 (51 y.o. [name] Primary Care Physician: [name] Other Clinician: Referring Physician: Treating Physician/Extender: Annice Needy in Treatment: 0 Education Assessment Education Provided To: Patient Education Topics Provided Pain: Handouts: A Guide to Pain Control Methods: Explain/Verbal Responses: Reinforcements needed Electronic Signature(s) Signed: 03/05/2022 5:13:10 PM By:  Deon Pilling RN, BSN Entered By: Deon Pilling on 03/05/2022 09:28:43 [name] [name] ([id number]) [id number] Page 9 of 12 -------------------------------------------------------------------------------- Wound Assessment Details Patient Name: Date of Service: [name], [name]. 03/05/2022 9:00 A M Medical Record Number: [id number] Patient Account Number: [id number] Date of Birth/Sex: Treating RN: [birth year] (51 y.o. [name], [name] Primary Care Provider: [name] Other Clinician: Referring Provider: Treating Provider/Extender: Annice Needy in Treatment: 0 Wound Status Wound Number: 10 Primary Diabetic Wound/Ulcer of the Lower Extremity Etiology: Wound Location: Left, Medial Lower Leg Secondary Lymphedema Wounding Event: Gradually Appeared Etiology: Date Acquired: 02/09/2022 Wound Open Weeks Of Treatment: 0 Status: Clustered Wound: Yes Comorbid Lymphedema, Sleep Apnea, Congestive Heart Failure, Coronary History: Artery Disease, Hypertension, Type II Diabetes Photos Wound Measurements Length: (cm) Width: (cm) Depth: (cm) Clustered Quantity: Area: (cm) Volume: (cm) 5.4 % Reduction in Area: 1.1 % Reduction in Volume: 0.1 Epithelialization: Small (1-33%) 2 Tunneling: No 4.665  Undermining: No 0.467 Wound Description Classification: Grade 1 Wound Margin: Distinct, outline attached Exudate Amount: Small Exudate Type: Serosanguineous Exudate Color: red, brown Foul Odor After Cleansing: No Slough/Fibrino Yes Wound Bed Granulation Amount: Medium (34-66%) Exposed Structure Granulation Quality: Red, Pink Fascia Exposed: No Necrotic Amount: Medium (34-66%) Fat Layer (Subcutaneous Tissue) Exposed: Yes Necrotic Quality: Adherent Slough Tendon Exposed: No Muscle Exposed: No Joint Exposed: No Bone Exposed: No Periwound Skin Texture Texture Color No Abnormalities Noted: No No Abnormalities Noted: No Callus: No Atrophie Blanche: No Crepitus: No Cyanosis: No Excoriation: No Ecchymosis: No Induration: No Erythema: No Rash: No Hemosiderin Staining: Yes Scarring: Yes Mottled: No Pallor: No Moisture Rubor: No No Abnormalities Noted: No JONNAE, FONSECA [name] ([id number]) [id number] Page 10 of 12 Dry / Scaly: No Maceration: No Treatment Notes Wound #10 (Lower Leg) Wound Laterality: Left, Medial Cleanser Soap and Water Discharge Instruction: May shower and wash wound with dial antibacterial soap and water prior to dressing change. Wound Cleanser Discharge Instruction: Cleanse the wound with wound cleanser prior to applying a clean dressing using gauze sponges, not tissue or cotton balls. Peri-Wound Care Sween Lotion (Moisturizing lotion) Discharge Instruction: Apply moisturizing lotion as directed Topical Primary Dressing Promogran Prisma Matrix, 4.34 (sq in) (silver collagen) Discharge Instruction: Moisten collagen with hydrogel Secondary Dressing ABD Pad, 8x10 Discharge Instruction: Apply over primary dressing as directed. Secured With Compression Wrap FourPress (4 layer compression wrap) Discharge Instruction: Apply four layer compression as directed. May also use Miliken CoFlex 2 layer compression system as  alternative. Compression Stockings Add-Ons Electronic Signature(s) Signed: 03/05/2022 5:13:10 PM By: Deon Pilling RN, BSN Entered By: Deon Pilling on 03/05/2022 09:05:25 -------------------------------------------------------------------------------- Wound Assessment Details Patient Name: Date of Service: [name], [name]. 03/05/2022 9:00 A M Medical Record Number: [id number] Patient Account Number: [id number] Date of Birth/Sex: Treating RN: Dec 19, 1970 (51 y.o. [name] Primary Care Provider: [name] Other Clinician: Referring Provider: Treating Provider/Extender: Annice Needy in Treatment: 0 Wound Status Wound Number: 9 Primary Abrasion Etiology: Wound Location: Left Groin Wound Open Wounding Event: Not Known Status: Date Acquired: 03/02/2022 Comorbid Lymphedema, Sleep Apnea, Congestive Heart Failure, Coronary Weeks Of Treatment: 0 History: Artery Disease, Hypertension, Type II Diabetes Clustered Wound: No Photos KARIZMA, CHEEK [name] ([id number]) [id number] Page 11 of 12 Wound Measurements Length: (cm) 0.9 Width: (cm) 1.6 Depth: (cm) 0.1 Area: (cm) 1.131 Volume: (cm) 0.113 % Reduction in Area: % Reduction in Volume: Epithelialization: Small (1-33%) Tunneling: No Undermining: No Wound Description Classification: Full Thickness Without Exposed Support Structures Wound Margin: Distinct,  outline attached Exudate Amount: Medium Exudate Type: Serosanguineous Exudate Color: red, brown Foul Odor After Cleansing: No Slough/Fibrino Yes Wound Bed Granulation Amount: Medium (34-66%) Exposed Structure Granulation Quality: Red, Pink Fascia Exposed: No Necrotic Amount: Medium (34-66%) Fat Layer (Subcutaneous Tissue) Exposed: Yes Necrotic Quality: Adherent Slough Tendon Exposed: No Muscle Exposed: No Joint Exposed: No Bone Exposed: No Periwound Skin Texture Texture Color No Abnormalities Noted:  No No Abnormalities Noted: No Callus: No Atrophie Blanche: No Crepitus: No Cyanosis: No Excoriation: No Ecchymosis: No Induration: No Erythema: No Rash: No Hemosiderin Staining: No Scarring: No Mottled: No Pallor: No Moisture Rubor: No No Abnormalities Noted: No Dry / Scaly: No Maceration: No Treatment Notes Wound #9 (Groin) Wound Laterality: Left Cleanser Soap and Water Discharge Instruction: May shower and wash wound with dial antibacterial soap and water prior to dressing change. Peri-Wound Care Topical Primary Dressing KerraCel Ag Gelling Fiber Dressing, 4x5 in (silver alginate) Discharge Instruction: Apply silver alginate to wound bed as instructed Secondary Dressing Woven Gauze Sponge, Non-Sterile 4x4 in Discharge Instruction: Apply over primary dressing as directed. Secured With [employer] Compression Stockings DAYAMI, TAITT [name] ([id number]) [id number] Page 12 of 12 Add-Ons Electronic Signature(s) Signed: 03/05/2022 5:13:10 PM By: Deon Pilling RN, BSN Entered By: Deon Pilling on 03/05/2022 09:09:05 -------------------------------------------------------------------------------- Vitals Details Patient Name: Date of Service: [name], Naisha [name]. 03/05/2022 9:00 A M Medical Record Number: [id number] Patient Account Number: [id number] Date of Birth/Sex: Treating RN: 1971/01/31 (51 y.o. [name], [name] Primary Care Emmons Toth: [name] Other Clinician: Referring [place] Pechacek: Treating Krystopher Kuenzel/Extender: Annice Needy in Treatment: 0 Vital Signs Time Taken: 08:51 Temperature (F): 98.2 Height (in): 69 Pulse (bpm): 112 Source: Stated Respiratory Rate (breaths/min): 20 Weight (lbs): 300 Blood Pressure (mmHg): 128/82 Source: Stated Capillary Blood Glucose (mg/dl): 164 Body Mass Index (BMI): 44.3 Reference Range: 80 - 120 mg / dl Electronic Signature(s) Signed: 03/05/2022 5:13:10 PM By: Deon Pilling RN, BSN Entered By: Deon Pilling on 03/05/2022 08:52:35

## 2022-03-07 DIAGNOSIS — J441 Chronic obstructive pulmonary disease with (acute) exacerbation: Secondary | ICD-10-CM | POA: Diagnosis not present

## 2022-03-07 LAB — CBC
HCT: 37 % (ref 36.0–46.0)
Hemoglobin: 10.8 g/dL — ABNORMAL LOW (ref 12.0–15.0)
MCH: 27.5 pg (ref 26.0–34.0)
MCHC: 29.2 g/dL — ABNORMAL LOW (ref 30.0–36.0)
MCV: 94.1 fL (ref 80.0–100.0)
Platelets: 136 10*3/uL — ABNORMAL LOW (ref 150–400)
RBC: 3.93 MIL/uL (ref 3.87–5.11)
RDW: 14.2 % (ref 11.5–15.5)
WBC: 3.4 10*3/uL — ABNORMAL LOW (ref 4.0–10.5)
nRBC: 0 % (ref 0.0–0.2)

## 2022-03-07 LAB — BASIC METABOLIC PANEL
Anion gap: 7 (ref 5–15)
BUN: 14 mg/dL (ref 6–20)
CO2: 30 mmol/L (ref 22–32)
Calcium: 9.1 mg/dL (ref 8.9–10.3)
Chloride: 102 mmol/L (ref 98–111)
Creatinine, Ser: 0.71 mg/dL (ref 0.44–1.00)
GFR, Estimated: >60 mL/min (ref >60)
Glucose, Bld: 161 mg/dL — ABNORMAL HIGH (ref 70–99)
Potassium: 4.4 mmol/L (ref 3.5–5.1)
Sodium: 139 mmol/L (ref 135–145)

## 2022-03-07 LAB — HIV ANTIBODY (ROUTINE TESTING W REFLEX): HIV Screen 4th Generation wRfx: NONREACTIVE

## 2022-03-07 MED ORDER — ZINC SULFATE 220 (50 ZN) MG PO CAPS
220.0000 mg | ORAL_CAPSULE | Freq: Every day | ORAL | Status: DC
  Administered 2022-03-07 – 2022-03-11 (×5): 220 mg via ORAL
  Filled 2022-03-07 (×5): qty 1

## 2022-03-07 MED ORDER — MELATONIN 3 MG PO TABS
6.0000 mg | ORAL_TABLET | Freq: Every day | ORAL | Status: DC
  Administered 2022-03-07 – 2022-03-10 (×4): 6 mg via ORAL
  Filled 2022-03-07 (×4): qty 2

## 2022-03-07 MED ORDER — VITAMIN C 500 MG PO TABS
500.0000 mg | ORAL_TABLET | Freq: Every day | ORAL | Status: DC
  Administered 2022-03-07 – 2022-03-11 (×5): 500 mg via ORAL
  Filled 2022-03-07 (×5): qty 1

## 2022-03-07 MED ORDER — METHYLPREDNISOLONE SODIUM SUCC 40 MG IJ SOLR
40.0000 mg | Freq: Once | INTRAMUSCULAR | Status: AC
  Administered 2022-03-07: 40 mg via INTRAVENOUS
  Filled 2022-03-07: qty 1

## 2022-03-07 MED ORDER — SPIRONOLACTONE 12.5 MG HALF TABLET
12.5000 mg | ORAL_TABLET | Freq: Every day | ORAL | Status: DC
  Administered 2022-03-07 – 2022-03-11 (×5): 12.5 mg via ORAL
  Filled 2022-03-07 (×5): qty 1

## 2022-03-07 MED ORDER — AMLODIPINE BESYLATE 5 MG PO TABS
2.5000 mg | ORAL_TABLET | Freq: Every day | ORAL | Status: DC
  Administered 2022-03-08 – 2022-03-11 (×4): 2.5 mg via ORAL
  Filled 2022-03-07 (×4): qty 1

## 2022-03-07 MED ORDER — GUAIFENESIN 100 MG/5ML PO LIQD
10.0000 mL | Freq: Three times a day (TID) | ORAL | Status: DC
  Administered 2022-03-07 – 2022-03-11 (×11): 10 mL via ORAL
  Filled 2022-03-07 (×11): qty 10

## 2022-03-07 NOTE — Progress Notes (Signed)
  Transition of Care [facility]) Screening Note   Patient Details  Name: [name] Date of Birth: 08-15-1970   [facility] of [facility]) CM/SW Contact:    Ihor Gully, LCSW Phone Number: 03/07/2022, 12:39 PM    Transition of Care Department [facility]) has reviewed patient and no TOC needs have been identified at this time. We will continue to monitor patient advancement through interdisciplinary progression rounds. If new patient transition needs arise, please place a TOC consult.

## 2022-03-07 NOTE — Progress Notes (Addendum)
Notified on call provider of patient's cough and chest pain.  Meds administered for cough at this time.  No new orders received.

## 2022-03-07 NOTE — Plan of Care (Signed)
  Problem: Acute Rehab PT Goals(only PT should resolve) Goal: Pt Will Go Supine/Side To Sit Outcome: Progressing Flowsheets (Taken 03/07/2022 1535) Pt will go Supine/Side to Sit:  Independently  with modified independence Goal: Patient Will Transfer Sit To/From Stand Outcome: Progressing Flowsheets (Taken 03/07/2022 1535) Patient will transfer sit to/from stand: with modified independence Goal: Pt Will Transfer Bed To Chair/Chair To Bed Outcome: Progressing Flowsheets (Taken 03/07/2022 1535) Pt will Transfer Bed to Chair/Chair to Bed: with modified independence Goal: Pt Will Ambulate Outcome: Progressing Flowsheets (Taken 03/07/2022 1535) Pt will Ambulate:  100 feet  with rolling walker  with modified independence   3:35 PM, 03/07/22 Lonell Grandchild, MPT Physical Therapist with [facility] [phone] office [phone] mobile phone

## 2022-03-07 NOTE — Hospital Course (Addendum)
[name]  is a 51 y.o. female, with medical history significant for lymphedema, iron deficiency anemia requiring IV iron transfusion, HFpEF, moderate persistent asthma, chronic hypoxia on 2 L, hypertension, morbid obesity, history of DVT on Eliquis. -Patient was sent from wound care center evaluation for wheezing, shortness of breath, patient had a visit at wound care center in [place] for chronic left lower extremity wound, which was bandaged, but she was noted to be febrile, with shortness of breath and wheezing, as well as tachycardic, so she was sent to [facility] ED for further evaluation. - in ED her work-up revealed influenza A positive, and she is with significant wheezing where there was a concern of COPD exacerbation, no evidence of volume overload on exam or imaging, she was treated with IV magnesium, nebulizer treatment, Solu-Medrol, and she was started on Tylenol for fever, and Tamiflu for her influenza, no bed availability in [place] so patient was accepted to [facility] for further management.  e ST changes    Assessment & Plan:     Principal Problem:   COPD with acute exacerbation ([place]) Active Problems:   Hyperlipidemia with target LDL less than 100   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 ([place])   Essential hypertension   (HFpEF) heart failure with preserved ejection fraction (HCC) -> although echo suggests normal diastolic parameters with normal left atrial size   CAD (coronary artery disease)   OSA (obstructive sleep apnea)   Influenza A     Influenza A infection with reactive airway disease COPD exacerbation -Shortness of breath with minimal exertion -Audibly wheezing at rest  -Improved hypoxia, was satting 90% on room air yesterday, improved to 98 %  -still having Extensive wheezing with rhonchi minimal crackles at lower lobes bilaterally  - positive  for influenza A. -Continue with Tamiflu --P.o.  steroids has been switched back to  -IV steroids  continue DuoNeb bronchodilator treatments - continuing Lasix IV -Continue to encourage spirometry and flutter valve   Chronic diastolic CHF -Shortness of breath exacerbated by influenza and mild volume overload versus obesity -Recent echo in January 2023 with a preserved EF -No evidence of volume overload,   -continue with home dose Lasix >> continue with IV Lasix -Continue with Farxiga, Aldactone   Hypertension  -continue with home medication including Coreg, Aldactone   Morbid obesity -With BMI of 59 -She is on semaglutide, will hold during hospital stay, can be resumed upon discharge   Hypokalemika -repleted     History of DVT  - Continue with Eliquis   Left lower extremity wound/left groin wound -This was just seen and evaluated at the wound clinic, it was bandaged with recommendation to follow-up in 1 week for dressing change, so we will keep dressing intact where she is to follow next week with her wound clinic. -Per patient request wound care has been consulted appreciate evaluation recommendations

## 2022-03-07 NOTE — Evaluation (Signed)
Physical Therapy Evaluation Patient Details Name: [name] MRN: [id number] DOB: 1970-08-23 Today's Date: 03/07/2022  History of Present Illness  Lyzbeth Genrich  is a 51 y.o. female, with medical history significant for lymphedema, iron deficiency anemia requiring IV iron transfusion, HFpEF, moderate persistent asthma, chronic hypoxia on 2 L, hypertension, morbid obesity, history of DVT on Eliquis.  -Patient was sent from wound care center evaluation for wheezing, shortness of breath, patient had a visit at wound care center in [place] for chronic left lower extremity wound, which was bandaged, but she was noted to be febrile, with shortness of breath and wheezing, as well as tachycardic, so she was sent to [facility] ED for further evaluation.  - in ED her work-up revealed influenza A positive, and she is with significant wheezing where there was a concern of COPD exacerbation, no evidence of volume overload on exam or imaging, she was treated with IV magnesium, nebulizer treatment, Solu-Medrol, and she was started on Tylenol for fever, and Tamiflu for her influenza, no bed availability in [place] so patient was accepted to [facility] for further management.   Clinical Impression  Patient demonstrates good return for sitting up at bedside, slow labored movement transferring without AD, safer using RW and tolerated ambulating in room and hallway without loss of balance.  Patient tolerated sitting up in chair after therapy and encouraged to have nursing staff walk her to bathroom when needing to go.  Patient will benefit from continued skilled physical therapy in hospital and recommended venue below to increase strength, balance, endurance for safe ADLs and gait.         Recommendations for follow up therapy are one component of a multi-disciplinary discharge planning process, led by the attending physician.  Recommendations may be updated based on patient status,  additional functional criteria and insurance authorization.  Follow Up Recommendations Home health PT      Assistance Recommended at Discharge PRN  Patient can return home with the following  Help with stairs or ramp for entrance;Assistance with cooking/housework;A little help with bathing/dressing/bathroom;A little help with walking and/or transfers    Equipment Recommendations None recommended by PT  Recommendations for Other Services       Functional Status Assessment Patient has had a recent decline in their functional status and demonstrates the ability to make significant improvements in function in a reasonable and predictable amount of time.     Precautions / Restrictions Precautions Precautions: Fall Restrictions Weight Bearing Restrictions: No      Mobility  Bed Mobility Overal bed mobility: Modified Independent                  Transfers Overall transfer level: Needs assistance Equipment used: Rolling walker (2 wheels), None Transfers: Sit to/from Stand, Bed to chair/wheelchair/BSC Sit to Stand: Supervision   Step pivot transfers: Supervision       General transfer comment: unsteady labored movement without AD, safer using RW    Ambulation/Gait Ambulation/Gait assistance: Supervision Gait Distance (Feet): 80 Feet Assistive device: Rolling walker (2 wheels) Gait Pattern/deviations: Decreased step length - right, Decreased step length - left, Decreased stride length Gait velocity: decreased     General Gait Details: slow slightly labored cadence without loss of balance, limited mostly due to fatigue  Stairs            Wheelchair Mobility    Modified Rankin (Stroke Patients Only)       Balance Overall balance assessment: Needs assistance Sitting-balance support: Feet  supported, No upper extremity supported Sitting balance-Leahy Scale: Good Sitting balance - Comments: seated at EOB   Standing balance support: During functional  activity, No upper extremity supported Standing balance-Leahy Scale: Fair Standing balance comment: fair/good using RW                             Pertinent Vitals/Pain Pain Assessment Pain Assessment: No/denies pain    Home Living Family/patient expects to be discharged to:: Private residence Living Arrangements: Parent Available Help at Discharge: Family;Available 24 hours/day Type of Home: Apartment Home Access: Level entry       Home Layout: One level Home Equipment: Conservation officer, nature (2 wheels);Cane - single point      Prior Function Prior Level of Function : Independent/Modified Independent             Mobility Comments: household ambulation using SPC mostly and RW PRN ADLs Comments: Independent     Hand Dominance        Extremity/Trunk Assessment   Upper Extremity Assessment Upper Extremity Assessment: Overall WFL for tasks assessed    Lower Extremity Assessment Lower Extremity Assessment: Generalized weakness    Cervical / Trunk Assessment Cervical / Trunk Assessment: Kyphotic  Communication   Communication: No difficulties  Cognition Arousal/Alertness: Awake/alert Behavior During Therapy: WFL for tasks assessed/performed Overall Cognitive Status: Within Functional Limits for tasks assessed                                          General Comments      Exercises     Assessment/Plan    PT Assessment Patient needs continued PT services  PT Problem List Decreased strength;Decreased activity tolerance;Decreased balance;Decreased mobility       PT Treatment Interventions DME instruction;Gait training;Stair training;Functional mobility training;Therapeutic activities;Therapeutic exercise;Patient/family education;Balance training    PT Goals (Current goals can be found in the Care Plan section)  Acute Rehab PT Goals Patient Stated Goal: return home with family to assist PT Goal Formulation: With patient Time For  Goal Achievement: 03/12/22 Potential to Achieve Goals: Good    Frequency Min 3X/week     Co-evaluation               AM-PAC PT "6 Clicks" Mobility  Outcome Measure Help needed turning from your back to your side while in a flat bed without using bedrails?: None Help needed moving from lying on your back to sitting on the side of a flat bed without using bedrails?: None Help needed moving to and from a bed to a chair (including a wheelchair)?: A Little Help needed standing up from a chair using your arms (e.g., wheelchair or bedside chair)?: A Little Help needed to walk in hospital room?: A Little Help needed climbing 3-5 steps with a railing? : A Lot 6 Click Score: 19    End of Session   Activity Tolerance: Patient tolerated treatment well;Patient limited by fatigue Patient left: in chair;with call bell/phone within reach Nurse Communication: Mobility status PT Visit Diagnosis: Unsteadiness on feet (R26.81);Other abnormalities of gait and mobility (R26.89);Muscle weakness (generalized) (M62.81)    Time: 6578-4696 PT Time Calculation (min) (ACUTE ONLY): 20 min   Charges:   PT Evaluation $PT Eval Moderate Complexity: 1 Mod PT Treatments $Therapeutic Activity: 8-22 mins        3:33 PM, 03/07/22 Lonell Grandchild, MPT Physical  Therapist with [facility] [phone] office [phone] mobile phone

## 2022-03-07 NOTE — Progress Notes (Signed)
PROGRESS NOTE    Patient: [name]                            PCP: Ladell Pier, MD                    DOB: 09-Apr-1971            DOA: 03/05/2022 ZOX:[id number]             DOS: 03/07/2022, 12:28 PM   LOS: 0 days   Date of Service: The patient was seen and examined on 03/07/2022  Subjective:   The patient was seen and examined this morning. Stable at this time. Still complaining of : Shortness of breath, cough with exertion Otherwise no issues overnight .  Brief Narrative:   Velinda Wrobel  is a 51 y.o. female, with medical history significant for lymphedema, iron deficiency anemia requiring IV iron transfusion, HFpEF, moderate persistent asthma, chronic hypoxia on 2 L, hypertension, morbid obesity, history of DVT on Eliquis. -Patient was sent from wound care center evaluation for wheezing, shortness of breath, patient had a visit at wound care center in [place] for chronic left lower extremity wound, which was bandaged, but she was noted to be febrile, with shortness of breath and wheezing, as well as tachycardic, so she was sent to [facility] ED for further evaluation. - in ED her work-up revealed influenza A positive, and she is with significant wheezing where there was a concern of COPD exacerbation, no evidence of volume overload on exam or imaging, she was treated with IV magnesium, nebulizer treatment, Solu-Medrol, and she was started on Tylenol for fever, and Tamiflu for her influenza, no bed availability in [place] so patient was accepted to [facility] for further management.  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 -Continue with Tamiflu -She is with still has significant wheezing bilaterally, continue with IV steroids, DuoNebs and as needed albuterol -Continue to encourage spirometry and flutter valve   Chronic diastolic CHF -Recent echo in January 2023 with a preserved EF -No evidence of volume overload,   -continue with home dose Lasix -Continue with Farxiga, Aldactone   Hypertension  -continue with home medication including Coreg, Aldactone   Morbid obesity -With BMI of 59 -She is on semaglutide, will hold during hospital stay, can be resumed upon discharge   Hypokalemika -repleted     History of DVT  - Continue with Eliquis   Left lower extremity wound -This was just seen and evaluated yesterday at the wound clinic, it was bandaged with recommendation to follow-up in 1 week for dressing change, so we will keep dressing intact where she is to follow next week with her wound clinic.          -----------------------------------------------------------------------------------------------------------------------------------------------  Nutritional status:  The patient's BMI is: Body mass index is 59.08 kg/m. I agree with the assessment and plan as outlined  ------------------------------------------------------------------------------------------------------------------------------------------------  DVT prophylaxis:   apixaban (ELIQUIS) tablet 5 mg   Code Status:   Code Status: Full Code  Family Communication: No family member present at bedside- attempt will be made to update daily The above findings and plan of care has been discussed with  patient (and family)  in detail,  they expressed understanding and agreement of above. -Advance care planning has been discussed.   Admission status:   Status is: Observation The patient remains OBS appropriate and will d/c before 2 midnights.  Disposition From home likely to be discharged in a.m. 03/08/2022 if remains stable     Procedures:   No admission procedures for hospital encounter.   Antimicrobials:  Anti-infectives (From admission, onward)    Start     Dose/Rate Route Frequency Ordered Stop   03/05/22 1830  oseltamivir (TAMIFLU) capsule 75 mg        75 mg Oral 2 times daily 03/05/22 1828          Medication:   [START ON 03/08/2022] amLODipine  2.5 mg Oral Daily   apixaban  5 mg Oral BID   ascorbic acid  500 mg Oral Daily   atorvastatin  40 mg Oral Daily   busPIRone  7.5 mg Oral BID   carvedilol  25 mg Oral BID WC   docusate sodium  100 mg Oral BID   furosemide  60 mg Oral Daily   gabapentin  100 mg Oral TID   guaiFENesin  10 mL Oral Q8H   ipratropium-albuterol  3 mL Nebulization Q6H   irbesartan  150 mg Oral Daily   mometasone-formoterol  2 puff Inhalation BID   oseltamivir  75 mg Oral BID   PARoxetine  20 mg Oral Daily   potassium chloride SA  20 mEq Oral Daily   predniSONE  40 mg Oral Q breakfast   spironolactone  12.5 mg Oral Daily   zinc sulfate  220 mg Oral Daily    acetaminophen, albuterol, methocarbamol, ondansetron (ZOFRAN) IV   Objective:   Vitals:   03/07/22 0209 03/07/22 0503 03/07/22 0811 03/07/22 0814  BP:  113/76    Pulse:  71    Resp:  16    Temp:  (!) 96.4 F (35.8 C)    TempSrc:      SpO2: 92% 97% 98% 98%  Weight:      Height:        Intake/Output Summary (Last 24 hours) at 03/07/2022 1228 Last data filed at 03/07/2022 1029 Gross per 24 hour  Intake 720 ml  Output 1750 ml  Net -1030 ml   Filed Weights   03/05/22 1617  Weight: (!) 146.5 kg     Examination:   Physical Exam  Constitution: Morbidly obese female  alert, cooperative, no distress,  Appears calm and comfortable  Psychiatric:   Normal and stable mood and affect, cognition intact,   HEENT:        Normocephalic, PERRL, otherwise with in Normal limits  Chest:         Chest symmetric Cardio vascular:  S1/S2, RRR, No murmure, No Rubs or Gallops  pulmonary: Clear to auscultation bilaterally, respirations unlabored, negative wheezes / crackles Abdomen: Soft, non-tender, non-distended, bowel sounds,no masses, no organomegaly Muscular skeletal: Limited exam - in bed, able to move all 4 extremities,   Neuro: CNII-XII intact. , normal motor and sensation,  reflexes intact  Extremities: No pitting edema lower extremities, +2 pulses  Skin: Dry, warm to touch, negative for any Rashes, No open wounds Wounds: per nursing documentation   ------------------------------------------------------------------------------------------------------------------------------------------    LABs:     Latest Ref Rng & Units 03/07/2022    3:29 AM 03/05/2022    4:18 PM 02/07/2022    1:48 PM  CBC  WBC 4.0 - 10.5 K/uL 3.4  4.0  4.6   Hemoglobin 12.0 - 15.0 g/dL 10.8  11.1  11.1   Hematocrit 36.0 - 46.0 % 37.0  37.6  37.3   Platelets 150 - 400 K/uL 136  106  134       Latest Ref Rng & Units 03/07/2022    3:29 AM 03/05/2022    4:18 PM 02/07/2022    1:48 PM  CMP  Glucose 70 - 99 mg/dL 161  91  96   BUN 6 - 20 mg/dL 14  7  6    Creatinine 0.44 - 1.00 mg/dL 0.71  0.74  0.66   Sodium 135 - 145 mmol/L 139  140  140   Potassium 3.5 - 5.1 mmol/L 4.4  3.3  3.4   Chloride 98 - 111 mmol/L 102  102  102   CO2 22 - 32 mmol/L 30  29  29    Calcium 8.9 - 10.3 mg/dL 9.1  9.2  9.6        Micro Results Recent Results (from the past 240 hour(s))  Resp Panel by RT-PCR (Flu A&B, Covid) Anterior Nasal Swab     Status: Abnormal   Collection Time: 03/05/22  4:32 PM   Specimen: Anterior Nasal Swab  Result Value Ref Range Status   SARS Coronavirus 2 by RT PCR NEGATIVE NEGATIVE  Final    Comment: (NOTE) SARS-CoV-2 target nucleic acids are NOT DETECTED.  The SARS-CoV-2 RNA is generally detectable in upper respiratory specimens during the acute phase of infection. The lowest concentration of SARS-CoV-2 viral copies this assay can detect is 138 copies/mL. A negative result does not preclude SARS-Cov-2 infection and should not be used as the sole basis for treatment or other patient management decisions. A negative result may occur with  improper specimen collection/handling, submission of specimen other than nasopharyngeal swab, presence of viral mutation(s) within the areas targeted by this assay, and inadequate number of viral copies(<138 copies/mL). A negative result must be combined with clinical observations, patient history, and epidemiological information. The expected result is Negative.  Fact Sheet for Patients:  EntrepreneurPulse.com.au  Fact Sheet for Healthcare Providers:  IncredibleEmployment.be  This test is no t yet approved or cleared by the [place] FDA and  has been authorized for detection and/or diagnosis of SARS-CoV-2 by FDA under an Emergency Use Authorization (EUA). This EUA will remain  in effect (meaning this test can be used) for the duration of the COVID-19 declaration under Section 564(b)(1) of the Act, 21 U.S.C.section 360bbb-3(b)(1), unless the authorization is terminated  or revoked sooner.       Influenza A by PCR POSITIVE (A) NEGATIVE Final   Influenza B by PCR NEGATIVE NEGATIVE Final    Comment: (NOTE) The Xpert Xpress SARS-CoV-2/FLU/RSV plus assay is intended as an aid in the diagnosis of influenza from Nasopharyngeal swab specimens and should not be used as a sole basis for treatment. Nasal washings and aspirates are unacceptable for Xpert Xpress SARS-CoV-2/FLU/RSV testing.  Fact Sheet for Patients: EntrepreneurPulse.com.au  Fact Sheet for Healthcare  Providers: IncredibleEmployment.be  This test is not yet  approved or cleared by the [place] and has been authorized for detection and/or diagnosis of SARS-CoV-2 by FDA under an Emergency Use Authorization (EUA). This EUA will remain in effect (meaning this test can be used) for the duration of the COVID-19 declaration under Section 564(b)(1) of the Act, 21 U.S.C. section 360bbb-3(b)(1), unless the authorization is terminated or revoked.  Performed at [facility], [address], [place], [place] [address]     Radiology Reports No results found.  SIGNED: Deatra James, MD, FHM. [facility],  Pager (please use [email or web address] to page/text) Please use Epic Secure Chat for non-urgent communication (7AM-7PM)  If 7PM-7AM, please contact night-coverage [email or web address], 03/07/2022, 12:28 PM

## 2022-03-08 DIAGNOSIS — Z803 Family history of malignant neoplasm of breast: Secondary | ICD-10-CM | POA: Diagnosis not present

## 2022-03-08 DIAGNOSIS — Z1152 Encounter for screening for COVID-19: Secondary | ICD-10-CM | POA: Diagnosis not present

## 2022-03-08 DIAGNOSIS — J441 Chronic obstructive pulmonary disease with (acute) exacerbation: Secondary | ICD-10-CM | POA: Diagnosis not present

## 2022-03-08 DIAGNOSIS — Z881 Allergy status to other antibiotic agents status: Secondary | ICD-10-CM | POA: Diagnosis not present

## 2022-03-08 DIAGNOSIS — Z825 Family history of asthma and other chronic lower respiratory diseases: Secondary | ICD-10-CM | POA: Diagnosis not present

## 2022-03-08 DIAGNOSIS — Z91041 Radiographic dye allergy status: Secondary | ICD-10-CM | POA: Diagnosis not present

## 2022-03-08 DIAGNOSIS — R0902 Hypoxemia: Secondary | ICD-10-CM | POA: Diagnosis not present

## 2022-03-08 DIAGNOSIS — Z6841 Body Mass Index (BMI) 40.0 and over, adult: Secondary | ICD-10-CM | POA: Diagnosis not present

## 2022-03-08 DIAGNOSIS — Z888 Allergy status to other drugs, medicaments and biological substances status: Secondary | ICD-10-CM | POA: Diagnosis not present

## 2022-03-08 DIAGNOSIS — Z7984 Long term (current) use of oral hypoglycemic drugs: Secondary | ICD-10-CM | POA: Diagnosis not present

## 2022-03-08 DIAGNOSIS — Z833 Family history of diabetes mellitus: Secondary | ICD-10-CM | POA: Diagnosis not present

## 2022-03-08 DIAGNOSIS — Z86718 Personal history of other venous thrombosis and embolism: Secondary | ICD-10-CM | POA: Diagnosis not present

## 2022-03-08 DIAGNOSIS — I110 Hypertensive heart disease with heart failure: Secondary | ICD-10-CM | POA: Diagnosis not present

## 2022-03-08 DIAGNOSIS — Z87891 Personal history of nicotine dependence: Secondary | ICD-10-CM | POA: Diagnosis not present

## 2022-03-08 DIAGNOSIS — E119 Type 2 diabetes mellitus without complications: Secondary | ICD-10-CM | POA: Diagnosis not present

## 2022-03-08 DIAGNOSIS — E785 Hyperlipidemia, unspecified: Secondary | ICD-10-CM | POA: Diagnosis not present

## 2022-03-08 DIAGNOSIS — Z886 Allergy status to analgesic agent status: Secondary | ICD-10-CM | POA: Diagnosis not present

## 2022-03-08 DIAGNOSIS — I2510 Atherosclerotic heart disease of native coronary artery without angina pectoris: Secondary | ICD-10-CM | POA: Diagnosis not present

## 2022-03-08 DIAGNOSIS — J101 Influenza due to other identified influenza virus with other respiratory manifestations: Secondary | ICD-10-CM | POA: Diagnosis not present

## 2022-03-08 DIAGNOSIS — I5032 Chronic diastolic (congestive) heart failure: Secondary | ICD-10-CM | POA: Diagnosis not present

## 2022-03-08 DIAGNOSIS — G4733 Obstructive sleep apnea (adult) (pediatric): Secondary | ICD-10-CM | POA: Diagnosis not present

## 2022-03-08 DIAGNOSIS — Z7901 Long term (current) use of anticoagulants: Secondary | ICD-10-CM | POA: Diagnosis not present

## 2022-03-08 DIAGNOSIS — J4540 Moderate persistent asthma, uncomplicated: Secondary | ICD-10-CM | POA: Diagnosis not present

## 2022-03-08 MED ORDER — METHYLPREDNISOLONE SODIUM SUCC 40 MG IJ SOLR
40.0000 mg | Freq: Once | INTRAMUSCULAR | Status: AC
  Administered 2022-03-08: 40 mg via INTRAVENOUS
  Filled 2022-03-08: qty 1

## 2022-03-08 MED ORDER — FUROSEMIDE 10 MG/ML IJ SOLN
40.0000 mg | Freq: Two times a day (BID) | INTRAMUSCULAR | Status: DC
  Administered 2022-03-08 – 2022-03-11 (×7): 40 mg via INTRAVENOUS
  Filled 2022-03-08 (×7): qty 4

## 2022-03-08 NOTE — TOC Initial Note (Signed)
Transition of Care [name]) - Initial/Assessment Note    Patient Details  Name: [name] MRN: [id number] Date of Birth: Oct 12, 1970  Transition of [facility]) CM/SW Contact:    Ihor Gully, LCSW Phone Number: 03/08/2022, 11:32 AM  Clinical Narrative:                 Patient from home with mother. Admitted for COPD exacerbation, Flu+. Patient discussed at length that she has a wound to her groin and her cough continues to linger, she still has phlegm, and she feels she needs more days in the hospital. Discussed that attending would make decisions about discharge readiness. Patient discussed that she did not want to give her mother and brother who is visiting what she has, however she also indicated that they were not feeling well either. She does not know if they are flu+ also.  Attending notified that patient requests wound care consult.  [place] companies unable to to accept due to Medicaid. Patient declines OPPT.  Expected Discharge Plan: Home/Self Care Barriers to Discharge: No Barriers Identified   Patient Goals and CMS Choice Patient states their goals for this hospitalization and ongoing recovery are:: patient wants to stay in the hospital a few more days then go home      Expected Discharge Plan and Services Expected Discharge Plan: Home/Self Care       Living arrangements for the past 2 months: Apartment                                      Prior Living Arrangements/Services Living arrangements for the past 2 months: Apartment Lives with:: Parents                   Activities of Daily Living Home Assistive Devices/Equipment: Cane (specify quad or straight), Walker (specify type) ADL Screening (condition at time of admission) Patient's cognitive ability adequate to safely complete daily activities?: Yes Is the patient deaf or have difficulty hearing?: No Does the patient have difficulty seeing, even when wearing glasses/contacts?: No Does  the patient have difficulty concentrating, remembering, or making decisions?: No Patient able to express need for assistance with ADLs?: Yes Does the patient have difficulty dressing or bathing?: No Independently performs ADLs?: Yes (appropriate for developmental age) Does the patient have difficulty walking or climbing stairs?: Yes Weakness of Legs: None Weakness of Arms/Hands: None  Permission Sought/Granted                  Emotional Assessment     Affect (typically observed): Appropriate Orientation: : Oriented to Self, Oriented to Place, Oriented to  Time, Oriented to Situation Alcohol / Substance Use: Not Applicable Psych Involvement: No (comment)  Admission diagnosis:  COPD exacerbation ([place]) [J44.1] COPD with acute exacerbation ([place]) [J44.1] Influenza [J11.1] Influenza A [J10.1] Patient Active Problem List   Diagnosis Date Noted   Influenza A 03/06/2022   COPD with acute exacerbation ([place]) 03/05/2022   Hyperlipidemia with target LDL less than 100 02/17/2022   Pain in thoracic spine 09/25/2021   Low back pain 09/25/2021   OSA (obstructive sleep apnea) 07/11/2021   Depression 05/28/2021   Encounter for care related to Port-a-Cath 05/27/2021   Acute on chronic respiratory failure with hypoxia ([place]) 05/26/2021   Morbid obesity with BMI of 50.0-59.9, adult ([place]) 05/26/2021   Acute respiratory failure ([place]) 11/23/2020   Acute respiratory failure with hypoxia ([place])  11/22/2020   Fever 11/22/2020   Bilateral lower extremity edema 11/22/2020   History of DVT (deep vein thrombosis) 11/22/2020   Asthma, chronic, moderate persistent, with acute exacerbation 11/22/2020   Lower limb ulcer, calf, left, limited to breakdown of skin ([place]) 06/09/2020   Lymphedema 06/09/2020   Iron deficiency anemia due to chronic blood loss 05/10/2020   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 ([place]) 04/13/2020   Venous insufficiency 04/13/2020   S/P  insertion of IVC (inferior vena caval) filter 04/13/2020   Chronic anemia 04/13/2020   Essential hypertension 04/13/2020   (HFpEF) heart failure with preserved ejection fraction (HCC) -> although echo suggests normal diastolic parameters with normal left atrial size 04/13/2020   CAD (coronary artery disease) 11/17/2019   PCP:  Ladell Pier, MD Pharmacy:   [facility] [id number], [place] [address] Phone: [phone] Fax: [facility] [place] [address] Phone: [phone] Fax: [phone]     Social Determinants of Health (SDOH) Interventions    Readmission Risk Interventions     No data to display

## 2022-03-08 NOTE — Progress Notes (Signed)
Patient given some ice cream, using her chest PT.

## 2022-03-08 NOTE — Progress Notes (Signed)
PROGRESS NOTE    Patient: [name]                            PCP: Ladell Pier, MD                    DOB: 26-Sep-1970            DOA: 03/05/2022 [id number]:[id number]             DOS: 03/08/2022, 11:06 AM   LOS: 0 days   Date of Service: The patient was seen and examined on 03/08/2022  Subjective:   The patient was seen and examined this morning, stable, anxious concerned about her groin wound and wheezing, shortness of breath with minimal exertion.   Brief Narrative:   Laramie Gelles  is a 51 y.o. female, with medical history significant for lymphedema, iron deficiency anemia requiring IV iron transfusion, HFpEF, moderate persistent asthma, chronic hypoxia on 2 L, hypertension, morbid obesity, history of DVT on Eliquis. -Patient was sent from wound care center evaluation for wheezing, shortness of breath, patient had a visit at wound care center in [place] for chronic left lower extremity wound, which was bandaged, but she was noted to be febrile, with shortness of breath and wheezing, as well as tachycardic, so she was sent to [facility] ED for further evaluation. - in ED her work-up revealed influenza A positive, and she is with significant wheezing where there was a concern of COPD exacerbation, no evidence of volume overload on exam or imaging, she was treated with IV magnesium, nebulizer treatment, Solu-Medrol, and she was started on Tylenol for fever, and Tamiflu for her influenza, no bed availability in [place] so patient was accepted to [facility] for further management.  e ST changes    Assessment & Plan:     Principal Problem:   COPD with acute exacerbation ([place]) Active Problems:   Hyperlipidemia with target LDL less than 100   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 ([place])   Essential hypertension   (HFpEF) heart failure with preserved ejection fraction (HCC) -> although echo suggests normal diastolic  parameters with normal left atrial size   CAD (coronary artery disease)   OSA (obstructive sleep apnea)   Influenza A     Influenza A infection with reactive airway disease COPD exacerbation -Continues to have shortness of breath with minimal exertion -Extensive wheezing with rhonchi minimal crackles at lower lobes bilaterally  - positive  for influenza A. -Continue with Tamiflu --Status post IV steroids, has been switched to p.o. with taper -continue DuoNeb bronchodilators -Her home dose Lasix has been switched to IV for today -Continue to encourage spirometry and flutter valve   Chronic diastolic CHF -Shortness of breath exacerbated by influenza and mild volume overload versus obesity -Recent echo in January 2023 with a preserved EF -No evidence of volume overload,   -continue with home dose Lasix >> switch to IV for today -Continue with Farxiga, Aldactone   Hypertension  -continue with home medication including Coreg, Aldactone   Morbid obesity -With BMI of 59 -She is on semaglutide, will hold during hospital stay, can be resumed upon discharge   Hypokalemika -repleted     History of DVT  - Continue with Eliquis   Left lower extremity wound/left groin wound -This was just seen and evaluated at the wound clinic, it was bandaged with recommendation to follow-up  in 1 week for dressing change, so we will keep dressing intact where she is to follow next week with her wound clinic. -Per patient request wound care has been consulted appreciate evaluation recommendations          ----------------------------------------------------------------------------------------------------------------------------------------------- Nutritional status:  The patient's BMI is: Body mass index is 59.08 kg/m. I agree with the assessment and plan as outlined   ------------------------------------------------------------------------------------------------------------------------------------------------  DVT prophylaxis:   apixaban (ELIQUIS) tablet 5 mg   Code Status:   Code Status: Full Code  Family Communication: No family member present at bedside- attempt will be made to update daily The above findings and plan of care has been discussed with patient (and family)  in detail,  they expressed understanding and agreement of above. -Advance care planning has been discussed.   Admission status:   Status is: Observation The patient remains OBS appropriate and will d/c before 2 midnights.  Disposition From home likely to be discharged in a.m. 03/09/2022 if remains stable     Procedures:   No admission procedures for hospital encounter.   Antimicrobials:  Anti-infectives (From admission, onward)    Start     Dose/Rate Route Frequency Ordered Stop   03/05/22 1830  oseltamivir (TAMIFLU) capsule 75 mg        75 mg Oral 2 times daily 03/05/22 1828          Medication:   amLODipine  2.5 mg Oral Daily   apixaban  5 mg Oral BID   ascorbic acid  500 mg Oral Daily   atorvastatin  40 mg Oral Daily   busPIRone  7.5 mg Oral BID   carvedilol  25 mg Oral BID WC   docusate sodium  100 mg Oral BID   furosemide  40 mg Intravenous Q12H   gabapentin  100 mg Oral TID   guaiFENesin  10 mL Oral Q8H   ipratropium-albuterol  3 mL Nebulization Q6H   irbesartan  150 mg Oral Daily   melatonin  6 mg Oral QHS   mometasone-formoterol  2 puff Inhalation BID   oseltamivir  75 mg Oral BID   PARoxetine  20 mg Oral Daily   potassium chloride SA  20 mEq Oral Daily   predniSONE  40 mg Oral Q breakfast   spironolactone  12.5 mg Oral Daily   zinc sulfate  220 mg Oral Daily    acetaminophen, albuterol, methocarbamol, ondansetron (ZOFRAN) IV   Objective:   Vitals:   03/07/22 2000 03/08/22 0251 03/08/22 0600 03/08/22 0728  BP: 124/83  (!) 139/96    Pulse: 85  72 77  Resp: 18  18 18   Temp: 98.8 F (37.1 C)  98.4 F (36.9 C)   TempSrc:      SpO2: 95% 96% 97% 99%  Weight:      Height:        Intake/Output Summary (Last 24 hours) at 03/08/2022 1106 Last data filed at 03/08/2022 0842 Gross per 24 hour  Intake 960 ml  Output 800 ml  Net 160 ml   Filed Weights   03/05/22 1617  Weight: (!) 146.5 kg     Examination:      Physical Exam:   General:  AAO x 3,  cooperative, no distress;   HEENT:  Normocephalic, PERRL, otherwise with in Normal limits   Neuro:  CNII-XII intact. , normal motor and sensation, reflexes intact   Lungs:   Clear to auscultation BL, Respirations unlabored,  Diffuse  +++wheezes with rhonchi, mild lower lobe  crackles  Cardio:    S1/S2, RRR, No murmure, No Rubs or Gallops   Abdomen:  Soft, non-tender, bowel sounds active all four quadrants, no guarding or peritoneal signs.  Muscular  skeletal:  Limited exam -global generalized weaknesses - in bed, able to move all 4 extremities,   2+ pulses,  symmetric, No pitting edema  Skin:  Left groin wound, left lower extremity wound  dry, warm to touch, negative for any Rashes,  Wounds: Please see nursing documentation         ------------------------------------------------------------------------------------------------------------------------------------------    LABs:     Latest Ref Rng & Units 03/07/2022    3:29 AM 03/05/2022    4:18 PM 02/07/2022    1:48 PM  CBC  WBC 4.0 - 10.5 K/uL 3.4  4.0  4.6   Hemoglobin 12.0 - 15.0 g/dL 10.8  11.1  11.1   Hematocrit 36.0 - 46.0 % 37.0  37.6  37.3   Platelets 150 - 400 K/uL 136  106  134       Latest Ref Rng & Units 03/07/2022    3:29 AM 03/05/2022    4:18 PM 02/07/2022    1:48 PM  CMP  Glucose 70 - 99 mg/dL 161  91  96   BUN 6 - 20 mg/dL 14  7  6    Creatinine 0.44 - 1.00 mg/dL 0.71  0.74  0.66   Sodium 135 - 145 mmol/L 139  140  140   Potassium 3.5 - 5.1 mmol/L 4.4  3.3  3.4   Chloride 98 -  111 mmol/L 102  102  102   CO2 22 - 32 mmol/L 30  29  29    Calcium 8.9 - 10.3 mg/dL 9.1  9.2  9.6        Micro Results Recent Results (from the past 240 hour(s))  Resp Panel by RT-PCR (Flu A&B, Covid) Anterior Nasal Swab     Status: Abnormal   Collection Time: 03/05/22  4:32 PM   Specimen: Anterior Nasal Swab  Result Value Ref Range Status   SARS Coronavirus 2 by RT PCR NEGATIVE NEGATIVE Final    Comment: (NOTE) SARS-CoV-2 target nucleic acids are NOT DETECTED.  The SARS-CoV-2 RNA is generally detectable in upper respiratory specimens during the acute phase of infection. The lowest concentration of SARS-CoV-2 viral copies this assay can detect is 138 copies/mL. A negative result does not preclude SARS-Cov-2 infection and should not be used as the sole basis for treatment or other patient management decisions. A negative result may occur with  improper specimen collection/handling, submission of specimen other than nasopharyngeal swab, presence of viral mutation(s) within the areas targeted by this assay, and inadequate number of viral copies(<138 copies/mL). A negative result must be combined with clinical observations, patient history, and epidemiological information. The expected result is Negative.  Fact Sheet for Patients:  EntrepreneurPulse.com.au  Fact Sheet for Healthcare Providers:  IncredibleEmployment.be  This test is no t yet approved or cleared by the [place] FDA and  has been authorized for detection and/or diagnosis of SARS-CoV-2 by FDA under an Emergency Use Authorization (EUA). This EUA will remain  in effect (meaning this test can be used) for the duration of the COVID-19 declaration under Section 564(b)(1) of the Act, 21 U.S.C.section 360bbb-3(b)(1), unless the authorization is terminated  or revoked sooner.       Influenza A by PCR POSITIVE (A) NEGATIVE Final   Influenza B by PCR NEGATIVE NEGATIVE Final     Comment: (  NOTE) The Xpert Xpress SARS-CoV-2/FLU/RSV plus assay is intended as an aid in the diagnosis of influenza from Nasopharyngeal swab specimens and should not be used as a sole basis for treatment. Nasal washings and aspirates are unacceptable for Xpert Xpress SARS-CoV-2/FLU/RSV testing.  Fact Sheet for Patients: EntrepreneurPulse.com.au  Fact Sheet for Healthcare Providers: IncredibleEmployment.be  This test is not yet approved or cleared by the [place] FDA and has been authorized for detection and/or diagnosis of SARS-CoV-2 by FDA under an Emergency Use Authorization (EUA). This EUA will remain in effect (meaning this test can be used) for the duration of the COVID-19 declaration under Section 564(b)(1) of the Act, 21 U.S.C. section 360bbb-3(b)(1), unless the authorization is terminated or revoked.  Performed at [facility], [name] [place], [place] [address]   Blood culture (routine x 2)     Status: None (Preliminary result)   Collection Time: 03/05/22  5:14 PM   Specimen: BLOOD  Result Value Ref Range Status   Specimen Description   Final    BLOOD BLOOD LEFT ARM Performed at [facility], [address] [place], [place] [address]    Special Requests   Final    Blood Culture adequate volume BOTTLES DRAWN AEROBIC AND ANAEROBIC Performed at [facility], [address], [place], [place] [address]    Culture   Final    NO GROWTH 3 DAYS Performed at [facility], [place] [address] [place], [place] [address]    Report Status PENDING  Incomplete  Blood culture (routine x 2)     Status: None (Preliminary result)   Collection Time: 03/05/22  5:15 PM   Specimen: BLOOD  Result Value Ref Range Status   Specimen Description   Final    BLOOD BLOOD RIGHT ARM Performed at [facility], [address], [place], [place] [address]    Special  Requests   Final    Blood Culture adequate volume BOTTLES DRAWN AEROBIC AND ANAEROBIC Performed at [facility], [address], [place], [place] [address]    Culture   Final    NO GROWTH 3 DAYS Performed at [facility], [place] [address] [place], [place] [address]    Report Status PENDING  Incomplete    Radiology Reports No results found.  SIGNED: Deatra James, MD, FHM. [facility],  Pager (please use [email or web address] to page/text) Please use Epic Secure Chat for non-urgent communication (7AM-7PM)  If 7PM-7AM, please contact night-coverage [email or web address], 03/08/2022, 11:06 AM

## 2022-03-08 NOTE — Progress Notes (Signed)
Patient has voiced several concerns this shift. Patient at beginning of shift did not understand her diagnosis of copd exacerbation and how that was affected by the flu.   Explanation given to patient about how one respiratory illness can affect and worsen chronic respiratory issues that a patient has.   Patient nodded head seemingly in understanding.  Patient later then voiced that she felt as if she was breathing better, but was still wheezing. Patient was concerned she would not be able to go home.  Patient was stable on room air and in no visible respiratory distress.  Patient also voiced concerns about open wound on left groin.  Wound was present at admission, and patient states that it was known and seen by wound care nurse at her wound care clinic in [place]. .  She goes to wound care clinic for her left lower leg wound.  Patient states the clinic told her that area in her groin was from an infected hair follicle.  Patient states she has difficulty believing that statement from the wound clinic, because hair follicles are small.  Patient states she feels as if the "wound is getting bigger and going into my vagina."  Reassessed size of wound, it appears same size as in beginning of shift.  Patient states she is going to get a bad infection from area in groin, and would like to see the wound nurse here to have it evaluated. Patient displayed much anxiety about groin wound .Otherwise vitals have been stable, patient has had adequate po intake.

## 2022-03-08 NOTE — Consult Note (Addendum)
WOC Nurse Consult Note: Patient receiving care in [facility]. In completing this order I reviewed her record, including the note from the MD from the [facility] on 03/05/22, and spoke with her primary nurse, Brandie. Reason for Consult: "open wound in the groin area" Wound type: unknown etiology; patient also has on a compression wrap to the LLE with a primary dressing not available in the [facility].  Per Dr. Glenford Peers note from 03/05/22, this is to be changed weekly. She will need to have this completed as a follow up upon discharge. Pressure Injury POA: Yes/No/NA Measurement: Wound bed: Drainage (amount, consistency, odor)  Periwound: Dressing procedure/placement/frequency: Cleanse left groin wound with saline. Place a piece of Aquacel Advantage Kellie Simmering [phone]) into the wound, cover with dry gauze, tape in place.  I have also entered an order for nursing to leave the compression wrap on the LLE and that the next change date is 03/12/22.  Discussed plan of care with the bedside nurse.  [place] nurse will not follow at this time.  Please re-consult the [place] team if needed.  Val Riles, RN, MSN, CWOCN, CNS-BC, pager [phone]

## 2022-03-08 NOTE — Progress Notes (Signed)
Patient used walker with minimal assist to transfer to bedside chair.

## 2022-03-08 NOTE — Progress Notes (Signed)
Patient wound dressed with cover dressing after being cleansed. Site dry/intact.

## 2022-03-09 DIAGNOSIS — J441 Chronic obstructive pulmonary disease with (acute) exacerbation: Secondary | ICD-10-CM | POA: Diagnosis not present

## 2022-03-09 MED ORDER — METHYLPREDNISOLONE SODIUM SUCC 40 MG IJ SOLR
40.0000 mg | Freq: Two times a day (BID) | INTRAMUSCULAR | Status: DC
  Administered 2022-03-09 – 2022-03-11 (×4): 40 mg via INTRAVENOUS
  Filled 2022-03-09 (×4): qty 1

## 2022-03-09 MED ORDER — PAROXETINE HCL 20 MG PO TABS
40.0000 mg | ORAL_TABLET | Freq: Every day | ORAL | Status: DC
  Administered 2022-03-10 – 2022-03-11 (×2): 40 mg via ORAL
  Filled 2022-03-09 (×2): qty 2

## 2022-03-09 NOTE — Progress Notes (Signed)
Patient has been stable and breathing improved this shift.  Weight taken this am and patient is down 4 kilograms.  Patient seems have more confidence in her respiratory status at this time.  Vitals have been stable, patient rested well.

## 2022-03-09 NOTE — Progress Notes (Signed)
PROGRESS NOTE    Patient: [name]                            PCP: Ladell Pier, MD                    DOB: 1970-07-15            DOA: 03/05/2022 [id number]             DOS: 03/09/2022, 11:43 AM   LOS: 1 day   Date of Service: The patient was seen and examined on 03/09/2022  Subjective:   Patient was seen and examined this morning, distress thinking she may be going home -Still complaining of significant shortness of breath, at rest, worsening shortness of breath with minimal exertion-  audibly wheezing Complaining of leg pain Currently satting 90% on room air   Brief Narrative:   [name]  is a 51 y.o. female, with medical history significant for lymphedema, iron deficiency anemia requiring IV iron transfusion, HFpEF, moderate persistent asthma, chronic hypoxia on 2 L, hypertension, morbid obesity, history of DVT on Eliquis. -Patient was sent from wound care center evaluation for wheezing, shortness of breath, patient had a visit at wound care center in [place] for chronic left lower extremity wound, which was bandaged, but she was noted to be febrile, with shortness of breath and wheezing, as well as tachycardic, so she was sent to [facility] ED for further evaluation. - in ED her work-up revealed influenza A positive, and she is with significant wheezing where there was a concern of COPD exacerbation, no evidence of volume overload on exam or imaging, she was treated with IV magnesium, nebulizer treatment, Solu-Medrol, and she was started on Tylenol for fever, and Tamiflu for her influenza, no bed availability in [place] so patient was accepted to [facility] for further management.  e ST changes    Assessment & Plan:     Principal Problem:   COPD with acute exacerbation ([place]) Active Problems:   Hyperlipidemia with target LDL less than 100   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 ([place])    Essential hypertension   (HFpEF) heart failure with preserved ejection fraction (HCC) -> although echo suggests normal diastolic parameters with normal left atrial size   CAD (coronary artery disease)   OSA (obstructive sleep apnea)   Influenza A     Influenza A infection with reactive airway disease COPD exacerbation -Continue to have shortness of breath at rest, and worsening with minimal exertion -Satting 90% on room air--anticipating adding supplemental oxygen to maintain O2 sat greater than 92%  -Extensive wheezing with rhonchi minimal crackles at lower lobes bilaterally  - positive  for influenza A. -Continue with Tamiflu --P.o. steroids has been switched back to IV -Continue DuoNeb bronchodilator treatments  -Due to respiratory distress, minimal lower lobe crackles, continuing Lasix IV -Continue to encourage spirometry and flutter valve   Chronic diastolic CHF -Shortness of breath exacerbated by influenza and mild volume overload versus obesity -Recent echo in January 2023 with a preserved EF -No evidence of volume overload,   -continue with home dose Lasix >> switch to IV  -Continue with Farxiga, Aldactone   Hypertension  -continue with home medication including Coreg, Aldactone   Morbid obesity -With BMI of 59 -She is on semaglutide, will hold during hospital stay, can be resumed upon discharge   Hypokalemika -  repleted     History of DVT  - Continue with Eliquis   Left lower extremity wound/left groin wound -This was just seen and evaluated at the wound clinic, it was bandaged with recommendation to follow-up in 1 week for dressing change, so we will keep dressing intact where she is to follow next week with her wound clinic. -Per patient request wound care has been consulted appreciate evaluation recommendations           ----------------------------------------------------------------------------------------------------------------------------------------------- Nutritional status:  The patient's BMI is: Body mass index is 57.42 kg/m. I agree with the assessment and plan as outlined  ------------------------------------------------------------------------------------------------------------------------------------------------  DVT prophylaxis:   apixaban (ELIQUIS) tablet 5 mg   Code Status:   Code Status: Full Code  Family Communication: No family member present at bedside- attempt will be made to update daily The above findings and plan of care has been discussed with patient (and family)  in detail,  they expressed understanding and agreement of above. -Advance care planning has been discussed.   Admission status:   Status is: Observation The patient remains OBS appropriate and will d/c before 2 midnights.  Disposition From home likely to be discharged in a.m. 03/09/2022 if remains stable     Procedures:   No admission procedures for hospital encounter.   Antimicrobials:  Anti-infectives (From admission, onward)    Start     Dose/Rate Route Frequency Ordered Stop   03/05/22 1830  oseltamivir (TAMIFLU) capsule 75 mg        75 mg Oral 2 times daily 03/05/22 1828          Medication:   amLODipine  2.5 mg Oral Daily   apixaban  5 mg Oral BID   ascorbic acid  500 mg Oral Daily   atorvastatin  40 mg Oral Daily   busPIRone  7.5 mg Oral BID   carvedilol  25 mg Oral BID WC   docusate sodium  100 mg Oral BID   furosemide  40 mg Intravenous Q12H   gabapentin  100 mg Oral TID   guaiFENesin  10 mL Oral Q8H   ipratropium-albuterol  3 mL Nebulization Q6H   irbesartan  150 mg Oral Daily   melatonin  6 mg Oral QHS   methylPREDNISolone (SOLU-MEDROL) injection  40 mg Intravenous Q12H   mometasone-formoterol  2 puff Inhalation BID   oseltamivir  75 mg Oral BID   [START ON 03/10/2022]  PARoxetine  40 mg Oral Daily   potassium chloride SA  20 mEq Oral Daily   spironolactone  12.5 mg Oral Daily   zinc sulfate  220 mg Oral Daily    acetaminophen, albuterol, methocarbamol, ondansetron (ZOFRAN) IV   Objective:   Vitals:   03/08/22 2007 03/09/22 0000 03/09/22 0422 03/09/22 0859  BP: 129/76  125/87   Pulse: 91  81   Resp: 18  19   Temp: 97.8 F (36.6 C)  97.8 F (36.6 C)   TempSrc: Oral  Oral   SpO2: 97%  95% 90%  Weight:  (!) 142.4 kg    Height:        Intake/Output Summary (Last 24 hours) at 03/09/2022 1143 Last data filed at 03/09/2022 1138 Gross per 24 hour  Intake 720 ml  Output 2200 ml  Net -1480 ml   Filed Weights   03/05/22 1617 03/09/22 0000  Weight: (!) 146.5 kg (!) 142.4 kg     Examination:     Physical Exam:   General:  AAO x 3,  cooperative, no distress;   HEENT:  Normocephalic, PERRL, otherwise with in Normal limits   Neuro:  CNII-XII intact. , normal motor and sensation, reflexes intact   Lungs:   Shortness of breath at rest, diffuse wheezing, rhonchi, clear to auscultation BL,  Minimal crackles bilaterally lower lobes  Cardio:    S1/S2, RRR, No murmure, No Rubs or Gallops   Abdomen:  Soft, non-tender, bowel sounds active all four quadrants, no guarding or peritoneal signs.  Muscular  skeletal:  Limited exam -global generalized weaknesses - in bed, able to move all 4 extremities,   2+ pulses,  symmetric, No pitting edema  Skin:  Dry, warm to touch, negative for any Rashes, groin open wound approximately 2 cm not draining--dressing in place  Wounds: Please see nursing documentation           ------------------------------------------------------------------------------------------------------------------------------------------    LABs:     Latest Ref Rng & Units 03/07/2022    3:29 AM 03/05/2022    4:18 PM 02/07/2022    1:48 PM  CBC  WBC 4.0 - 10.5 K/uL 3.4  4.0  4.6   Hemoglobin 12.0 - 15.0 g/dL 10.8  11.1  11.1    Hematocrit 36.0 - 46.0 % 37.0  37.6  37.3   Platelets 150 - 400 K/uL 136  106  134       Latest Ref Rng & Units 03/07/2022    3:29 AM 03/05/2022    4:18 PM 02/07/2022    1:48 PM  CMP  Glucose 70 - 99 mg/dL 161  91  96   BUN 6 - 20 mg/dL 14  7  6    Creatinine 0.44 - 1.00 mg/dL 0.71  0.74  0.66   Sodium 135 - 145 mmol/L 139  140  140   Potassium 3.5 - 5.1 mmol/L 4.4  3.3  3.4   Chloride 98 - 111 mmol/L 102  102  102   CO2 22 - 32 mmol/L 30  29  29    Calcium 8.9 - 10.3 mg/dL 9.1  9.2  9.6        Micro Results Recent Results (from the past 240 hour(s))  Resp Panel by RT-PCR (Flu A&B, Covid) Anterior Nasal Swab     Status: Abnormal   Collection Time: 03/05/22  4:32 PM   Specimen: Anterior Nasal Swab  Result Value Ref Range Status   SARS Coronavirus 2 by RT PCR NEGATIVE NEGATIVE Final    Comment: (NOTE) SARS-CoV-2 target nucleic acids are NOT DETECTED.  The SARS-CoV-2 RNA is generally detectable in upper respiratory specimens during the acute phase of infection. The lowest concentration of SARS-CoV-2 viral copies this assay can detect is 138 copies/mL. A negative result does not preclude SARS-Cov-2 infection and should not be used as the sole basis for treatment or other patient management decisions. A negative result may occur with  improper specimen collection/handling, submission of specimen other than nasopharyngeal swab, presence of viral mutation(s) within the areas targeted by this assay, and inadequate number of viral copies(<138 copies/mL). A negative result must be combined with clinical observations, patient history, and epidemiological information. The expected result is Negative.  Fact Sheet for Patients:  EntrepreneurPulse.com.au  Fact Sheet for Healthcare Providers:  IncredibleEmployment.be  This test is no t yet approved or cleared by the [place] FDA and  has been authorized for detection and/or diagnosis of  SARS-CoV-2 by FDA under an Emergency Use Authorization (EUA). This EUA will remain  in effect (meaning this test can be  used) for the duration of the COVID-19 declaration under Section 564(b)(1) of the Act, 21 U.S.C.section 360bbb-3(b)(1), unless the authorization is terminated  or revoked sooner.       Influenza A by PCR POSITIVE (A) NEGATIVE Final   Influenza B by PCR NEGATIVE NEGATIVE Final    Comment: (NOTE) The Xpert Xpress SARS-CoV-2/FLU/RSV plus assay is intended as an aid in the diagnosis of influenza from Nasopharyngeal swab specimens and should not be used as a sole basis for treatment. Nasal washings and aspirates are unacceptable for Xpert Xpress SARS-CoV-2/FLU/RSV testing.  Fact Sheet for Patients: EntrepreneurPulse.com.au  Fact Sheet for Healthcare Providers: IncredibleEmployment.be  This test is not yet approved or cleared by the [place] FDA and has been authorized for detection and/or diagnosis of SARS-CoV-2 by FDA under an Emergency Use Authorization (EUA). This EUA will remain in effect (meaning this test can be used) for the duration of the COVID-19 declaration under Section 564(b)(1) of the Act, 21 U.S.C. section 360bbb-3(b)(1), unless the authorization is terminated or revoked.  Performed at [facility], [address] [place], [place] [address]   Blood culture (routine x 2)     Status: None (Preliminary result)   Collection Time: 03/05/22  5:14 PM   Specimen: BLOOD  Result Value Ref Range Status   Specimen Description   Final    BLOOD BLOOD LEFT ARM Performed at [facility], [address] [place], [place] [address]    Special Requests   Final    Blood Culture adequate volume BOTTLES DRAWN AEROBIC AND ANAEROBIC Performed at [facility], [address], [place], [place] [address]    Culture   Final    NO GROWTH 4 DAYS Performed at [facility], [place] [address], [place], [place] [address]    Report Status PENDING  Incomplete  Blood culture (routine x 2)     Status: None (Preliminary result)   Collection Time: 03/05/22  5:15 PM   Specimen: BLOOD  Result Value Ref Range Status   Specimen Description   Final    BLOOD BLOOD RIGHT ARM Performed at [facility], [address] [place], [place] [address]    Special Requests   Final    Blood Culture adequate volume BOTTLES DRAWN AEROBIC AND ANAEROBIC Performed at [facility], [address] [place], [place] [address]    Culture   Final    NO GROWTH 4 DAYS Performed at [facility], [place] [name], [place], [place] [address]    Report Status PENDING  Incomplete    Radiology Reports No results found.  SIGNED: Deatra James, MD, FHM. [facility],  Pager (please use [email or web address] to page/text) Please use Epic Secure Chat for non-urgent communication (7AM-7PM)  If 7PM-7AM, please contact night-coverage [email or web address], 03/09/2022, 11:43 AM

## 2022-03-10 DIAGNOSIS — J441 Chronic obstructive pulmonary disease with (acute) exacerbation: Secondary | ICD-10-CM | POA: Diagnosis not present

## 2022-03-10 LAB — CULTURE, BLOOD (ROUTINE X 2)
Culture: NO GROWTH
Culture: NO GROWTH
Special Requests: ADEQUATE
Special Requests: ADEQUATE

## 2022-03-10 MED ORDER — TRIPLE ANTIBIOTIC 3.5-400-5000 EX OINT
1.0000 | TOPICAL_OINTMENT | Freq: Two times a day (BID) | CUTANEOUS | Status: DC
  Administered 2022-03-10 (×2): 1 via CUTANEOUS
  Filled 2022-03-10: qty 1

## 2022-03-10 MED ORDER — NEOMYCIN-POLYMYXIN-PRAMOXINE 1 % EX CREA: TOPICAL_CREAM | Freq: Two times a day (BID) | CUTANEOUS | Status: DC

## 2022-03-10 NOTE — Progress Notes (Signed)
Patient has taken all medications without any difficulty today. We ambulated in the room and then down the hallway, around the square of the nursing station and back to the room. She tolerated the walk very well, without any respiratory distress.

## 2022-03-10 NOTE — Progress Notes (Signed)
Patient rested during the night. Dressing changed at 0530 am. Patient tolerated well.

## 2022-03-10 NOTE — Progress Notes (Signed)
PROGRESS NOTE    Patient: [name]                            PCP: Ladell Pier, MD                    DOB: January 17, 1971            DOA: 03/05/2022 VEL:[id number]             DOS: 03/10/2022, 11:44 AM   LOS: 2 days   Date of Service: The patient was seen and examined on 03/10/2022  Subjective:   The patient was seen and examined this morning, still audibly wheezing, shortness of breath with minimal exertion. Denies any chest pain    Brief Narrative:   [name]  is a 51 y.o. female, with medical history significant for lymphedema, iron deficiency anemia requiring IV iron transfusion, HFpEF, moderate persistent asthma, chronic hypoxia on 2 L, hypertension, morbid obesity, history of DVT on Eliquis. -Patient was sent from wound care center evaluation for wheezing, shortness of breath, patient had a visit at wound care center in [place] for chronic left lower extremity wound, which was bandaged, but she was noted to be febrile, with shortness of breath and wheezing, as well as tachycardic, so she was sent to [facility] ED for further evaluation. - in ED her work-up revealed influenza A positive, and she is with significant wheezing where there was a concern of COPD exacerbation, no evidence of volume overload on exam or imaging, she was treated with IV magnesium, nebulizer treatment, Solu-Medrol, and she was started on Tylenol for fever, and Tamiflu for her influenza, no bed availability in [place] so patient was accepted to [facility] for further management.  e ST changes    Assessment & Plan:     Principal Problem:   COPD with acute exacerbation ([place]) Active Problems:   Hyperlipidemia with target LDL less than 100   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 ([place])   Essential hypertension   (HFpEF) heart failure with preserved ejection fraction (HCC) -> although echo suggests normal diastolic parameters with  normal left atrial size   CAD (coronary artery disease)   OSA (obstructive sleep apnea)   Influenza A     Influenza A infection with reactive airway disease COPD exacerbation -Shortness of breath with minimal exertion -Audibly wheezing at rest  -Improved hypoxia, was satting 90% on room air yesterday, improved to 98 %  -still having Extensive wheezing with rhonchi minimal crackles at lower lobes bilaterally  - positive  for influenza A. -Continue with Tamiflu --P.o. steroids has been switched back to  -IV steroids  continue DuoNeb bronchodilator treatments - continuing Lasix IV -Continue to encourage spirometry and flutter valve   Chronic diastolic CHF -Shortness of breath exacerbated by influenza and mild volume overload versus obesity -Recent echo in January 2023 with a preserved EF -No evidence of volume overload,   -continue with home dose Lasix >> continue with IV Lasix -Continue with Farxiga, Aldactone   Hypertension  -continue with home medication including Coreg, Aldactone   Morbid obesity -With BMI of 59 -She is on semaglutide, will hold during hospital stay, can be resumed upon discharge   Hypokalemika -repleted     History of DVT  - Continue with Eliquis   Left lower extremity wound/left groin wound -This was just seen and evaluated at  the wound clinic, it was bandaged with recommendation to follow-up in 1 week for dressing change, so we will keep dressing intact where she is to follow next week with her wound clinic. -Per patient request wound care has been consulted appreciate evaluation recommendations          ----------------------------------------------------------------------------------------------------------------------------------------------- Nutritional status:  The patient's BMI is: Body mass index is 57.42 kg/m. I agree with the assessment and plan as outlined   ------------------------------------------------------------------------------------------------------------------------------------------------  DVT prophylaxis:   apixaban (ELIQUIS) tablet 5 mg   Code Status:   Code Status: Full Code  Family Communication: No family member present at bedside- attempt will be made to update daily The above findings and plan of care has been discussed with patient (and family)  in detail,  they expressed understanding and agreement of above. -Advance care planning has been discussed.   Admission status:   Status is: Observation The patient remains OBS appropriate and will d/c before 2 midnights.  Disposition From home likely to be discharged in a.m. 03/09/2022 if remains stable     Procedures:   No admission procedures for hospital encounter.   Antimicrobials:  Anti-infectives (From admission, onward)    Start     Dose/Rate Route Frequency Ordered Stop   03/05/22 1830  oseltamivir (TAMIFLU) capsule 75 mg        75 mg Oral 2 times daily 03/05/22 1828          Medication:   amLODipine  2.5 mg Oral Daily   apixaban  5 mg Oral BID   ascorbic acid  500 mg Oral Daily   atorvastatin  40 mg Oral Daily   busPIRone  7.5 mg Oral BID   carvedilol  25 mg Oral BID WC   docusate sodium  100 mg Oral BID   furosemide  40 mg Intravenous Q12H   gabapentin  100 mg Oral TID   guaiFENesin  10 mL Oral Q8H   ipratropium-albuterol  3 mL Nebulization Q6H   irbesartan  150 mg Oral Daily   melatonin  6 mg Oral QHS   methylPREDNISolone (SOLU-MEDROL) injection  40 mg Intravenous Q12H   mometasone-formoterol  2 puff Inhalation BID   oseltamivir  75 mg Oral BID   PARoxetine  40 mg Oral Daily   potassium chloride SA  20 mEq Oral Daily   spironolactone  12.5 mg Oral Daily   zinc sulfate  220 mg Oral Daily    acetaminophen, albuterol, methocarbamol, ondansetron (ZOFRAN) IV   Objective:   Vitals:   03/09/22 2020 03/10/22 0324 03/10/22 0337 03/10/22  0917  BP:   115/73   Pulse:   85   Resp:   18   Temp:   97.6 F (36.4 C)   TempSrc:   Oral   SpO2: 95% 95% 97% 98%  Weight:      Height:        Intake/Output Summary (Last 24 hours) at 03/10/2022 1144 Last data filed at 03/10/2022 0900 Gross per 24 hour  Intake 480 ml  Output 1300 ml  Net -820 ml   Filed Weights   03/05/22 1617 03/09/22 0000  Weight: (!) 146.5 kg (!) 142.4 kg     Examination:     Physical Exam:   General:  AAO x 3,  cooperative, in mild distress complain of shortness of breath with exertion and wheezing  HEENT:  Normocephalic, PERRL, otherwise with in Normal limits   Neuro:  CNII-XII intact. , normal motor and sensation, reflexes intact  Lungs:   Clear to auscultation BL, Respirations unlabored,  ++++ Diffuse wheezes /mild bilateral lower lobe crackles  Cardio:    S1/S2, RRR, No murmure, No Rubs or Gallops   Abdomen:  Soft, non-tender, bowel sounds active all four quadrants, no guarding or peritoneal signs.  Muscular  skeletal:  Limited exam -global generalized weaknesses - in bed, able to move all 4 extremities,   2+ pulses,  symmetric, No pitting edema  Skin:  Dry, warm to touch, negative for any Rashes, Approximate 2 cm open right groin wound  Wounds: Please see nursing documentation      ------------------------------------------------------------------------------------------------------------------------------------------    LABs:     Latest Ref Rng & Units 03/07/2022    3:29 AM 03/05/2022    4:18 PM 02/07/2022    1:48 PM  CBC  WBC 4.0 - 10.5 K/uL 3.4  4.0  4.6   Hemoglobin 12.0 - 15.0 g/dL 10.8  11.1  11.1   Hematocrit 36.0 - 46.0 % 37.0  37.6  37.3   Platelets 150 - 400 K/uL 136  106  134       Latest Ref Rng & Units 03/07/2022    3:29 AM 03/05/2022    4:18 PM 02/07/2022    1:48 PM  CMP  Glucose 70 - 99 mg/dL 161  91  96   BUN 6 - 20 mg/dL 14  7  6    Creatinine 0.44 - 1.00 mg/dL 0.71  0.74  0.66   Sodium 135 - 145 mmol/L  139  140  140   Potassium 3.5 - 5.1 mmol/L 4.4  3.3  3.4   Chloride 98 - 111 mmol/L 102  102  102   CO2 22 - 32 mmol/L 30  29  29    Calcium 8.9 - 10.3 mg/dL 9.1  9.2  9.6        Micro Results Recent Results (from the past 240 hour(s))  Resp Panel by RT-PCR (Flu A&B, Covid) Anterior Nasal Swab     Status: Abnormal   Collection Time: 03/05/22  4:32 PM   Specimen: Anterior Nasal Swab  Result Value Ref Range Status   SARS Coronavirus 2 by RT PCR NEGATIVE NEGATIVE Final    Comment: (NOTE) SARS-CoV-2 target nucleic acids are NOT DETECTED.  The SARS-CoV-2 RNA is generally detectable in upper respiratory specimens during the acute phase of infection. The lowest concentration of SARS-CoV-2 viral copies this assay can detect is 138 copies/mL. A negative result does not preclude SARS-Cov-2 infection and should not be used as the sole basis for treatment or other patient management decisions. A negative result may occur with  improper specimen collection/handling, submission of specimen other than nasopharyngeal swab, presence of viral mutation(s) within the areas targeted by this assay, and inadequate number of viral copies(<138 copies/mL). A negative result must be combined with clinical observations, patient history, and epidemiological information. The expected result is Negative.  Fact Sheet for Patients:  EntrepreneurPulse.com.au  Fact Sheet for Healthcare Providers:  IncredibleEmployment.be  This test is no t yet approved or cleared by the [place] FDA and  has been authorized for detection and/or diagnosis of SARS-CoV-2 by FDA under an Emergency Use Authorization (EUA). This EUA will remain  in effect (meaning this test can be used) for the duration of the COVID-19 declaration under Section 564(b)(1) of the Act, 21 U.S.C.section 360bbb-3(b)(1), unless the authorization is terminated  or revoked sooner.       Influenza A by PCR  POSITIVE (A) NEGATIVE Final  Influenza B by PCR NEGATIVE NEGATIVE Final    Comment: (NOTE) The Xpert Xpress SARS-CoV-2/FLU/RSV plus assay is intended as an aid in the diagnosis of influenza from Nasopharyngeal swab specimens and should not be used as a sole basis for treatment. Nasal washings and aspirates are unacceptable for Xpert Xpress SARS-CoV-2/FLU/RSV testing.  Fact Sheet for Patients: EntrepreneurPulse.com.au  Fact Sheet for Healthcare Providers: IncredibleEmployment.be  This test is not yet approved or cleared by the [place] FDA and has been authorized for detection and/or diagnosis of SARS-CoV-2 by FDA under an Emergency Use Authorization (EUA). This EUA will remain in effect (meaning this test can be used) for the duration of the COVID-19 declaration under Section 564(b)(1) of the Act, 21 U.S.C. section 360bbb-3(b)(1), unless the authorization is terminated or revoked.  Performed at [facility], [address] [place], [place] [address]   Blood culture (routine x 2)     Status: None   Collection Time: 03/05/22  5:14 PM   Specimen: BLOOD  Result Value Ref Range Status   Specimen Description   Final    BLOOD BLOOD LEFT ARM Performed at [facility], [address] [place], [place] [address]    Special Requests   Final    Blood Culture adequate volume BOTTLES DRAWN AEROBIC AND ANAEROBIC Performed at [facility], [address], [place], [place] [address]    Culture   Final    NO GROWTH 5 DAYS Performed at [facility], [place] [address] [place], [place] [address]    Report Status 03/10/2022 FINAL  Final  Blood culture (routine x 2)     Status: None   Collection Time: 03/05/22  5:15 PM   Specimen: BLOOD  Result Value Ref Range Status   Specimen Description   Final    BLOOD BLOOD RIGHT ARM Performed at [facility], [address] [place], [place] [address]    Special Requests   Final    Blood Culture adequate volume BOTTLES DRAWN AEROBIC AND ANAEROBIC Performed at [facility], [address], [place], [place] [address]    Culture   Final    NO GROWTH 5 DAYS Performed at [facility], [place] [address] [place], [place] [address]    Report Status 03/10/2022 FINAL  Final    Radiology Reports No results found.  SIGNED: Deatra James, MD, FHM. [facility],  Pager (please use [email or web address] to page/text) Please use Epic Secure Chat for non-urgent communication (7AM-7PM)  If 7PM-7AM, please contact night-coverage [email or web address], 03/10/2022, 11:44 AM

## 2022-03-11 DIAGNOSIS — J441 Chronic obstructive pulmonary disease with (acute) exacerbation: Secondary | ICD-10-CM | POA: Diagnosis not present

## 2022-03-11 MED ORDER — METHYLPREDNISOLONE 4 MG PO TBPK: ORAL_TABLET | ORAL | 0 refills | Status: DC

## 2022-03-11 MED ORDER — TRIPLE ANTIBIOTIC 3.5-400-5000 EX OINT: 1.0000 | TOPICAL_OINTMENT | Freq: Two times a day (BID) | CUTANEOUS | 0 refills | Status: DC

## 2022-03-11 MED ORDER — ALBUTEROL SULFATE HFA 108 (90 BASE) MCG/ACT IN AERS: INHALATION_SPRAY | RESPIRATORY_TRACT | 0 refills | Status: DC

## 2022-03-11 NOTE — Progress Notes (Signed)
Patient rested very well after midnight, left sided growing wound dressing changed, changed pure wick. Patient ambulated to the bathroom with 1 assist. No respiratory distress tonight.

## 2022-03-11 NOTE — Discharge Summary (Signed)
Physician Discharge Summary   Patient: [name] MRN: [id number] DOB: 1970-09-04  Admit date:     03/05/2022  Discharge date: 03/11/22  Discharge Physician: Deatra James   PCP: Ladell Pier, MD   Recommendations at discharge:   Follow with a PCP in 1-4 weeks Follow-up with pulmonologist in 1-4 weeks Continue wound care per RN wound instructions (keep the area clean with a dressing) -follow-up with the wound clinic within 1 week Continue your inhalers Taper down oral steroids as instructed  Discharge Diagnoses: Principal Problem:   COPD with acute exacerbation ([place]) Active Problems:   Hyperlipidemia with target LDL less than 100   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 (HCC)   Essential hypertension   (HFpEF) heart failure with preserved ejection fraction (HCC) -> although echo suggests normal diastolic parameters with normal left atrial size   CAD (coronary artery disease)   OSA (obstructive sleep apnea)   Influenza A  Resolved Problems:   * No resolved hospital problems. *  Hospital Course: [name]  is a 51 y.o. female, with medical history significant for lymphedema, iron deficiency anemia requiring IV iron transfusion, HFpEF, moderate persistent asthma, chronic hypoxia on 2 L, hypertension, morbid obesity, history of DVT on Eliquis. -Patient was sent from wound care center evaluation for wheezing, shortness of breath, patient had a visit at wound care center in [place] for chronic left lower extremity wound, which was bandaged, but she was noted to be febrile, with shortness of breath and wheezing, as well as tachycardic, so she was sent to [facility] ED for further evaluation. - in ED her work-up revealed influenza A positive, and she is with significant wheezing where there was a concern of COPD exacerbation, no evidence of volume overload on exam or imaging, she was treated with IV magnesium,  nebulizer treatment, Solu-Medrol, and she was started on Tylenol for fever, and Tamiflu for her influenza, no bed availability in [place] so patient was accepted to [facility] for further management.  e ST changes   Hospital course     Principal Problem:   COPD with acute exacerbation ([place]) Active Problems:   Hyperlipidemia with target LDL less than 100   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 (HCC)   Essential hypertension   (HFpEF) heart failure with preserved ejection fraction (HCC) -> although echo suggests normal diastolic parameters with normal left atrial size   CAD (coronary artery disease)   OSA (obstructive sleep apnea)   Influenza A     Influenza A infection with reactive airway disease COPD exacerbation -Continue to wheeze, likely at baseline -Much improved satting greater 95% on room air  -still having Extensive wheezing with rhonchi minimal crackles at lower lobes bilaterally  - positive  for influenza A. -Completed 5 days of Tamiflu -with vitamin C/zinc -Let us post IV steroid treatment, switch to p.o. taper prednisone -Post scheduled DuoNeb bronchodilators, switch back to her home inhalers -P.o. Lasix -Continue to encourage spirometry and flutter valve   Chronic diastolic CHF -Shortness of breath exacerbated by influenza and mild volume overload versus obesity -Recent echo in January 2023 with a preserved EF -No evidence of volume overload,   -continue with home dose Lasix >> was on IV Lasix during hospitalization, resume p.o. Lasix -Continue with Farxiga, Aldactone   Hypertension  -continue with home medication including Coreg, Aldactone   Morbid obesity -With BMI of 59 -She  is on semaglutide, will hold during hospital stay, can be resumed upon discharge   Hypokalemika -repleted     History of DVT  - Continue with Eliquis   Left lower extremity wound/left groin wound -This was just seen and  evaluated at the wound clinic, it was bandaged with recommendation to follow-up in 1 week for dressing change, so we will keep dressing intact where she is to follow next week with her wound clinic. -Per patient request wound care has been consulted appreciate evaluation recommendations       Consultants: None Procedures performed: None Disposition: Home Diet recommendation:  Discharge Diet Orders (From admission, onward)     Start     Ordered   03/11/22 0000  Diet - low sodium heart healthy        03/11/22 0759           Cardiac and Carb modified diet DISCHARGE MEDICATION: Allergies as of 03/11/2022       Reactions   Ace Inhibitors Rash, Other (See Comments)   Make pt bleed   Aspirin Other (See Comments)   Per patient paperwork: blood clot?  Likely because of chronic DOAC   Hydromorphone Hives, Itching   Vancomycin Itching, Rash   Contrast Media [iodinated Contrast Media] Hives   Dilaudid [hydromorphone Hcl] Hives   Lidocaine Itching   Itching with Lidocaine patch reported 06/13/2021 vis telephone message.        Medication List     STOP taking these medications    diphenhydrAMINE 50 MG tablet Commonly known as: BENADRYL   omeprazole 20 MG capsule Commonly known as: PRILOSEC   Pfizer-BioNT COVID-19 Vac-TriS Susp injection Generic drug: COVID-19 mRNA Vac-TriS Therapist, music)       TAKE these medications    Accu-Chek Guide Control Liqd 1 each by In Vitro route daily.   Accu-Chek Guide w/Device Kit 1 each by Does not apply route 2 (two) times daily.   Accu-Chek Softclix Lancets lancets Use as instructed What changed:  how much to take how to take this when to take this   acetaminophen 650 MG CR tablet Commonly known as: TYLENOL Take 650 mg by mouth every 8 (eight) hours as needed for pain. What changed: Another medication with the same name was changed. Make sure you understand how and when to take each.   acetaminophen 500 MG tablet Commonly known  as: TYLENOL Take 1 tablet (500 mg total) by mouth every 6 (six) hours as needed. What changed: reasons to take this   albuterol 108 (90 Base) MCG/ACT inhaler Commonly known as: ProAir HFA INHALE TWO PUFFS BY MOUTH EVERY 6 HOURS AS NEEDED FOR WHEEZING OR SHORTNESS OF BREATH   amLODipine 5 MG tablet Commonly known as: NORVASC TAKE 1/2 TABLET BY MOUTH DAILY   apixaban 5 MG Tabs tablet Commonly known as: Eliquis Take 1 tablet (5 mg total) by mouth 2 (two) times daily.   atorvastatin 40 MG tablet Commonly known as: LIPITOR Take 1 tablet (40 mg total) by mouth daily.   busPIRone 7.5 MG tablet Commonly known as: BUSPAR Take 1 tablet by mouth 2 (two) times daily.   carvedilol 25 MG tablet Commonly known as: COREG Take 1 tablet (25 mg total) by mouth 2 (two) times daily with a meal.   cholecalciferol 25 MCG (1000 UNIT) tablet Commonly known as: VITAMIN D3 Take 1 tablet (1,000 Units total) by mouth daily.   cyanocobalamin 1000 MCG tablet Commonly known as: VITAMIN B12 Take 1,000 mcg by mouth daily.  Dulera 200-5 MCG/ACT Aero Generic drug: mometasone-formoterol INHALE 2 PUFFS BY MOUTH EVERY MORNING AND INHALE TWO PUFFS BY MOUTH EVERY NIGHT AT BEDTIME What changed: See the new instructions.   EYE DROPS OP Place 1 drop into both eyes daily as needed (dry eyes).   Farxiga 10 MG Tabs tablet Generic drug: dapagliflozin propanediol TAKE 1 TABLET BY MOUTH DAILY   FeroSul 325 (65 FE) MG tablet Generic drug: ferrous sulfate TAKE ONE TABLET BY MOUTH DAILY WITH BREAKFAST   furosemide 80 MG tablet Commonly known as: LASIX Take 1 tablet (80 mg total) by mouth daily. Take additional dose of lasix for weight gain, leg swelling   gabapentin 100 MG capsule Commonly known as: NEURONTIN Take 1 capsule (100 mg total) by mouth 3 (three) times daily.   glucose blood test strip Use as instructed What changed:  how much to take how to take this when to take this   hydrocortisone  cream 1 % Apply 1 application topically daily as needed for itching.   hydrOXYzine 25 MG capsule Commonly known as: VISTARIL Take 1 capsule (25 mg total) by mouth at bedtime as needed. What changed: reasons to take this   methocarbamol 500 MG tablet Commonly known as: ROBAXIN Take 1 tablet (500 mg total) by mouth 2 (two) times daily as needed for muscle spasms.   methylPREDNISolone 4 MG Tbpk tablet Commonly known as: MEDROL DOSEPAK Medrol Dosepak take as instructed   neomycin-bacitracin-polymyxin [phone] Oint Apply 1 Application topically 2 (two) times daily.   nitroGLYCERIN 0.4 MG SL tablet Commonly known as: NITROSTAT Place 1 tablet (0.4 mg total) under the tongue every 5 (five) minutes as needed for chest pain.   PARoxetine 20 MG tablet Commonly known as: PAXIL Take 1 tablet (20 mg total) by mouth daily.   potassium chloride SA 20 MEQ tablet Commonly known as: Klor-Con M20 Take 1 tablet (20 mEq total) by mouth daily.   Semaglutide (1 MG/DOSE) 4 MG/3ML Sopn Inject 1 mg as directed once a week.   spironolactone 25 MG tablet Commonly known as: ALDACTONE Take 1 tablet (25 mg total) by mouth daily.               Discharge Care Instructions  (From admission, onward)           Start     Ordered   03/11/22 0000  Discharge wound care:       Comments: Continue wound care per RN wound instructions Follow-up with outpatient wound care clinic   03/11/22 0759            Discharge Exam: Filed Weights   03/05/22 1617 03/09/22 0000  Weight: (!) 146.5 kg (!) 142.4 kg      Physical Exam:   General:  AAO x 3,  cooperative, no distress;   HEENT:  Normocephalic, PERRL, otherwise with in Normal limits   Neuro:  CNII-XII intact. , normal motor and sensation, reflexes intact   Lungs:   Clear to auscultation BL, Respirations unlabored,  Diffuse chronic wheezes / no crackles  Cardio:    S1/S2, RRR, No murmure, No Rubs or Gallops   Abdomen:  Soft,  non-tender, bowel sounds active all four quadrants, no guarding or peritoneal signs.  Muscular  skeletal:  Limited exam -global generalized weaknesses - in bed, able to move all 4 extremities,   2+ pulses,  symmetric, No pitting edema  Skin:  Dry, warm to touch, groin wound, dressing in place  Wounds: Please see nursing documentation  Condition at discharge: good  The results of significant diagnostics from this hospitalization (including imaging, microbiology, ancillary and laboratory) are listed below for reference.   Imaging Studies: DG Chest Portable 1 View  Result Date: 03/05/2022 CLINICAL DATA:  Shortness of breath. EXAM: PORTABLE CHEST 1 VIEW COMPARISON:  February 07, 2022. FINDINGS: The heart size and mediastinal contours are within normal limits. Both lungs are clear. The visualized skeletal structures are unremarkable. IMPRESSION: No active disease. Electronically Signed   By: Marijo Conception M.D.   On: 03/05/2022 18:07    Microbiology: Results for orders placed or performed during the hospital encounter of 03/05/22  Resp Panel by RT-PCR (Flu A&B, Covid) Anterior Nasal Swab     Status: Abnormal   Collection Time: 03/05/22  4:32 PM   Specimen: Anterior Nasal Swab  Result Value Ref Range Status   SARS Coronavirus 2 by RT PCR NEGATIVE NEGATIVE Final    Comment: (NOTE) SARS-CoV-2 target nucleic acids are NOT DETECTED.  The SARS-CoV-2 RNA is generally detectable in upper respiratory specimens during the acute phase of infection. The lowest concentration of SARS-CoV-2 viral copies this assay can detect is 138 copies/mL. A negative result does not preclude SARS-Cov-2 infection and should not be used as the sole basis for treatment or other patient management decisions. A negative result may occur with  improper specimen collection/handling, submission of specimen other than nasopharyngeal swab, presence of viral mutation(s) within the areas targeted by this assay,  and inadequate number of viral copies(<138 copies/mL). A negative result must be combined with clinical observations, patient history, and epidemiological information. The expected result is Negative.  Fact Sheet for Patients:  EntrepreneurPulse.com.au  Fact Sheet for Healthcare Providers:  IncredibleEmployment.be  This test is no t yet approved or cleared by the [place] FDA and  has been authorized for detection and/or diagnosis of SARS-CoV-2 by FDA under an Emergency Use Authorization (EUA). This EUA will remain  in effect (meaning this test can be used) for the duration of the COVID-19 declaration under Section 564(b)(1) of the Act, 21 U.S.C.section 360bbb-3(b)(1), unless the authorization is terminated  or revoked sooner.       Influenza A by PCR POSITIVE (A) NEGATIVE Final   Influenza B by PCR NEGATIVE NEGATIVE Final    Comment: (NOTE) The Xpert Xpress SARS-CoV-2/FLU/RSV plus assay is intended as an aid in the diagnosis of influenza from Nasopharyngeal swab specimens and should not be used as a sole basis for treatment. Nasal washings and aspirates are unacceptable for Xpert Xpress SARS-CoV-2/FLU/RSV testing.  Fact Sheet for Patients: EntrepreneurPulse.com.au  Fact Sheet for Healthcare Providers: IncredibleEmployment.be  This test is not yet approved or cleared by the [place] FDA and has been authorized for detection and/or diagnosis of SARS-CoV-2 by FDA under an Emergency Use Authorization (EUA). This EUA will remain in effect (meaning this test can be used) for the duration of the COVID-19 declaration under Section 564(b)(1) of the Act, 21 U.S.C. section 360bbb-3(b)(1), unless the authorization is terminated or revoked.  Performed at [facility], [address], [place], [place] [address]   Blood culture (routine x 2)     Status: None   Collection Time:  03/05/22  5:14 PM   Specimen: BLOOD  Result Value Ref Range Status   Specimen Description   Final    BLOOD BLOOD LEFT ARM Performed at [facility], [address] [place], [place] [address]    Special Requests   Final    Blood  Culture adequate volume BOTTLES DRAWN AEROBIC AND ANAEROBIC Performed at [facility], [address], [place], [place] [address]    Culture   Final    NO GROWTH 5 DAYS Performed at [facility], [place] [address], [place], [place] [address]    Report Status 03/10/2022 FINAL  Final  Blood culture (routine x 2)     Status: None   Collection Time: 03/05/22  5:15 PM   Specimen: BLOOD  Result Value Ref Range Status   Specimen Description   Final    BLOOD BLOOD RIGHT ARM Performed at [facility], [address], [place], [place] [address]    Special Requests   Final    Blood Culture adequate volume BOTTLES DRAWN AEROBIC AND ANAEROBIC Performed at [facility], [address] [place], [place] [address]    Culture   Final    NO GROWTH 5 DAYS Performed at [facility], [place] [name], [place], [place] [address]    Report Status 03/10/2022 FINAL  Final    Labs: CBC: Recent Labs  Lab 03/05/22 1618 03/07/22 0329  WBC 4.0 3.4*  HGB 11.1* 10.8*  HCT 37.6 37.0  MCV 92.6 94.1  PLT 106* 542*   Basic Metabolic Panel: Recent Labs  Lab 03/05/22 1618 03/05/22 1859 03/07/22 0329  NA 140  --  139  K 3.3*  --  4.4  CL 102  --  102  CO2 29  --  30  GLUCOSE 91  --  161*  BUN 7  --  14  CREATININE 0.74  --  0.71  CALCIUM 9.2  --  9.1  MG  --  1.8  --    Liver Function Tests: No results for input(s): "AST", "ALT", "ALKPHOS", "BILITOT", "PROT", "ALBUMIN" in the last 168 hours. CBG: No results for input(s): "GLUCAP" in the last 168 hours.  Discharge time spent: greater than 30 minutes.  Signed: Deatra James, MD [facility] 03/11/2022

## 2022-03-12 ENCOUNTER — Telehealth: Payer: Self-pay

## 2022-03-12 ENCOUNTER — Encounter (HOSPITAL_BASED_OUTPATIENT_CLINIC_OR_DEPARTMENT_OTHER): Payer: Medicaid Other | Admitting: General Surgery

## 2022-03-12 ENCOUNTER — Ambulatory Visit: Payer: Medicaid Other | Admitting: Pharmacist

## 2022-03-12 DIAGNOSIS — Z6841 Body Mass Index (BMI) 40.0 and over, adult: Secondary | ICD-10-CM | POA: Diagnosis not present

## 2022-03-12 DIAGNOSIS — L97122 Non-pressure chronic ulcer of left thigh with fat layer exposed: Secondary | ICD-10-CM | POA: Diagnosis not present

## 2022-03-12 DIAGNOSIS — Z7901 Long term (current) use of anticoagulants: Secondary | ICD-10-CM | POA: Diagnosis not present

## 2022-03-12 DIAGNOSIS — L97822 Non-pressure chronic ulcer of other part of left lower leg with fat layer exposed: Secondary | ICD-10-CM | POA: Diagnosis not present

## 2022-03-12 DIAGNOSIS — E11622 Type 2 diabetes mellitus with other skin ulcer: Secondary | ICD-10-CM | POA: Diagnosis not present

## 2022-03-12 DIAGNOSIS — Z87891 Personal history of nicotine dependence: Secondary | ICD-10-CM | POA: Diagnosis not present

## 2022-03-12 DIAGNOSIS — I5032 Chronic diastolic (congestive) heart failure: Secondary | ICD-10-CM | POA: Diagnosis not present

## 2022-03-12 DIAGNOSIS — Z86718 Personal history of other venous thrombosis and embolism: Secondary | ICD-10-CM | POA: Diagnosis not present

## 2022-03-12 DIAGNOSIS — L98492 Non-pressure chronic ulcer of skin of other sites with fat layer exposed: Secondary | ICD-10-CM | POA: Diagnosis not present

## 2022-03-12 DIAGNOSIS — I110 Hypertensive heart disease with heart failure: Secondary | ICD-10-CM | POA: Diagnosis not present

## 2022-03-12 DIAGNOSIS — I872 Venous insufficiency (chronic) (peripheral): Secondary | ICD-10-CM | POA: Diagnosis not present

## 2022-03-12 DIAGNOSIS — I890 Lymphedema, not elsewhere classified: Secondary | ICD-10-CM | POA: Diagnosis not present

## 2022-03-12 NOTE — Patient Outreach (Signed)
Transition Care Management Follow-up Telephone Call Date of discharge and from where: 03/11/22 [facility] How have you been since you were released from the hospital? A little bit better Any questions or concerns? Yes  Items Reviewed: Did the pt receive and understand the discharge instructions provided? Yes  Medications obtained and verified? Yes  Other? No  Any new allergies since your discharge? No  Dietary orders reviewed? No Do you have support at home? Yes   Home Care and Equipment/Supplies: Were home health services ordered? not applicable If so, what is the name of the agency?   Has the agency set up a time to come to the patient's home? not applicable Were any new equipment or medical supplies ordered?  No What is the name of the medical supply agency?  Were you able to get the supplies/equipment? not applicable Do you have any questions related to the use of the equipment or supplies? No  Functional Questionnaire: (I = Independent and D = Dependent) ADLs: I  Bathing/Dressing- I  Meal Prep- I  Eating- I  Maintaining continence- I  Transferring/Ambulation- I  Managing Meds- I  Follow up appointments reviewed:  PCP Hospital f/u appt confirmed? Yes  Scheduled to see [name] on 03/12/22  Specialist Hospital f/u appt confirmed?  Scheduled to see  Are transportation arrangements needed? No  If their condition worsens, is the pt aware to call PCP or go to the Emergency Dept.? Yes Was the patient provided with contact information for the PCP's office or ED? Yes Was to pt encouraged to call back with questions or concerns? Yes  [name], BSW, [place] Managed Medicaid Team  [phone]

## 2022-03-13 NOTE — Progress Notes (Signed)
SHONDA, MANDARINO D ([id number]) [id number] Page 1 of 10 Visit Report for 03/12/2022 Arrival Information Details Patient Name: Date of Service: [name], [name] 03/12/2022 10:30 A M Medical Record Number: [id number] Patient Account Number: [id number] Date of Birth/Sex: Treating RN: February 27, 1971 (51 y.o. Iver Nestle, [name] Primary Care Mayley Lish: [name] Other Clinician: Referring Isiac Breighner: Treating Elizardo Chilson/Extender: Annice Needy in Treatment: 1 Visit Information History Since Last Visit Added or deleted any medications: No Patient Arrived: Ambulatory Any new allergies or adverse reactions: No Arrival Time: 10:45 Had a fall or experienced change in No Accompanied By: self activities of daily living that may affect Transfer Assistance: None risk of falls: Patient Identification Verified: Yes Signs or symptoms of abuse/neglect since last visito No Secondary Verification Process Completed: Yes Hospitalized since last visit: No Patient Requires Transmission-Based Precautions: No Implantable device outside of the clinic excluding No Patient Has Alerts: Yes cellular tissue based products placed in the center Patient Alerts: Patient on Blood Thinner since last visit: ABI R 0.99 08/10/2021 Has Dressing in Place as Prescribed: Yes ABI L 1.04 08/10/2021 Pain Present Now: Yes Electronic Signature(s) Signed: 03/12/2022 3:06:09 PM By: Blanche East RN Entered By: Blanche East on 03/12/2022 10:45:47 -------------------------------------------------------------------------------- Compression Therapy Details Patient Name: Date of Service: [name], [name] 03/12/2022 10:30 A M Medical Record Number: [id number] Patient Account Number: [id number] Date of Birth/Sex: Treating RN: Jun 08, 1970 (51 y.o. Elam [name] Primary Care Nanda Bittick: [name] Other Clinician: Referring Juanluis Guastella: Treating Ashland Wiseman/Extender: Annice Needy in Treatment: 1 Compression Therapy Performed for Wound Assessment: Wound #10 Left,Medial Lower Leg Performed By: Clinician Baruch Gouty, RN Compression Type: Four Layer Post Procedure Diagnosis Same as Pre-procedure Electronic Signature(s) Signed: 03/12/2022 5:53:41 PM By: Baruch Gouty RN, BSN Entered By: Baruch Gouty on 03/12/2022 11:07:56 [name] ([id number]) [id number] Page 2 of 10 -------------------------------------------------------------------------------- Encounter Discharge Information Details Patient Name: Date of Service: LABRITTANY, [name] 03/12/2022 10:30 A M Medical Record Number: [id number] Patient Account Number: [id number] Date of Birth/Sex: Treating RN: Feb 04, 1971 (51 y.o. Elam [name] Primary Care Gatsby Chismar: [name] Other Clinician: Referring Canaan Prue: Treating Nashay Brickley/Extender: Annice Needy in Treatment: 1 Encounter Discharge Information Items Post Procedure Vitals Discharge Condition: Stable Temperature (F): 97.5 Ambulatory Status: Ambulatory Pulse (bpm): 90 Discharge Destination: Home Respiratory Rate (breaths/min): 20 Transportation: Private Auto Blood Pressure (mmHg): 125/84 Accompanied By: self Schedule Follow-up Appointment: Yes Clinical Summary of Care: Patient Declined Electronic Signature(s) Signed: 03/12/2022 5:53:41 PM By: Baruch Gouty RN, BSN Entered By: Baruch Gouty on 03/12/2022 11:31:49 -------------------------------------------------------------------------------- Lower Extremity Assessment Details Patient Name: Date of Service: [name] 03/12/2022 10:30 A M Medical Record Number: [id number] Patient Account Number: [id number] Date of Birth/Sex: Treating RN: 09/30/1970 (51 y.o. Marta Lamas Primary Care Becki Mccaskill: [name] Other Clinician: Referring Dariusz Brase: Treating Odaliz Mcqueary/Extender:  Annice Needy in Treatment: 1 Edema Assessment Assessed: [Left: No] [Right: No] Edema: [Left: Ye] [Right: s] Calf Left: Right: Point of Measurement: 28 cm From Medial Instep 41.3 cm Ankle Left: Right: Point of Measurement: 12 cm From Medial Instep 23.7 cm Vascular Assessment Pulses: Dorsalis Pedis Palpable: [Left:Yes] Electronic Signature(s) Signed: 03/12/2022 3:06:09 PM By: Blanche East RN Entered By: Blanche East on 03/12/2022 10:49:49 Multi Wound Chart Details -------------------------------------------------------------------------------- [name] ([id number]) [id number] Page 3 of 10 Patient Name: Date of Service: [name], [name] 03/12/2022 10:30 A M Medical Record Number: [id number] Patient Account Number: [id number] Date of  Birth/Sex: Treating RN: 1971-02-02 (51 y.o. F) Primary Care Breeana Sawtelle: [name] Other Clinician: Referring Krimson Massmann: Treating Normagene Harvie/Extender: Annice Needy in Treatment: 1 Vital Signs Height(in): 69 Capillary Blood Glucose(mg/dl): 154 Weight(lbs): 300 Pulse(bpm): 70 Body Mass Index(BMI): 44.3 Blood Pressure(mmHg): 125/84 Temperature(F): 97.5 Respiratory Rate(breaths/min): 20 Wound Assessments Wound Number: 10 9 N/A Photos: N/A Left, Medial Lower Leg Left Groin N/A Wound Location: Gradually Appeared Not Known N/A Wounding Event: Diabetic Wound/Ulcer of the Lower Abrasion N/A Primary Etiology: Extremity Lymphedema N/A N/A Secondary Etiology: Lymphedema, Sleep Apnea, Lymphedema, Sleep Apnea, N/A Comorbid History: Congestive Heart Failure, Coronary Congestive Heart Failure, Coronary Artery Disease, Hypertension, Type II Artery Disease, Hypertension, Type II Diabetes Diabetes 02/09/2022 03/02/2022 N/A Date Acquired: 1 1 N/A Weeks of Treatment: Open Open N/A Wound Status: No No N/A Wound Recurrence: Yes No N/A Clustered  Wound: 2 N/A N/A Clustered Quantity: 1x0.5x0.1 0.3x1x0.1 N/A Measurements L x W x D (cm) 0.393 0.236 N/A A (cm) : rea 0.039 0.024 N/A Volume (cm) : 91.60% 79.10% N/A % Reduction in A rea: 91.60% 78.80% N/A % Reduction in Volume: Grade 1 Full Thickness Without Exposed N/A Classification: Support Structures Small Small N/A Exudate A mount: Serosanguineous Serosanguineous N/A Exudate Type: red, brown red, brown N/A Exudate Color: Distinct, outline attached Distinct, outline attached N/A Wound Margin: Large (67-100%) Large (67-100%) N/A Granulation A mount: Red, Pink Red, Pink N/A Granulation Quality: Small (1-33%) Small (1-33%) N/A Necrotic A mount: Eschar, Adherent Slough Adherent Slough N/A Necrotic Tissue: Fat Layer (Subcutaneous Tissue): Yes Fat Layer (Subcutaneous Tissue): Yes N/A Exposed Structures: Fascia: No Fascia: No Tendon: No Tendon: No Muscle: No Muscle: No Joint: No Joint: No Bone: No Bone: No Large (67-100%) Medium (34-66%) N/A Epithelialization: Debridement - Selective/Open Wound Debridement - Selective/Open Wound N/A Debridement: Pre-procedure Verification/Time Out 11:05 11:05 N/A Taken: Lidocaine 4% Topical Solution Lidocaine 4% Topical Solution N/A Pain Control: Necrotic/Eschar, Other, [employer] N/A Tissue Debrided: Non-Viable Tissue Non-Viable Tissue N/A Level: 0.5 0.3 N/A Debridement A (sq cm): rea Curette Curette N/A Instrument: Minimum Minimum N/A Bleeding: Pressure Pressure N/A Hemostasis A chieved: 0 0 N/A Procedural Pain: 0 0 N/A Post Procedural Pain: Procedure was tolerated well Procedure was tolerated well N/A Debridement Treatment Response: 1x0.5x0.1 0.3x1x0.1 N/A Post Debridement Measurements L x W x D (cm) 0.039 0.024 N/A Post Debridement Volume: (cm) Scarring: Yes Scarring: Yes N/A Periwound Skin Texture: Excoriation: No Excoriation: No Induration: No Induration: No Callus: No Callus: No CATERINA, RACINE D ([id number]) [id number] Page 4 of 10 Crepitus: No Crepitus: No Rash: No Rash: No Maceration: No Maceration: No N/A Periwound Skin Moisture: Dry/Scaly: No Dry/Scaly: No Hemosiderin Staining: Yes Atrophie Blanche: No N/A Periwound Skin Color: Atrophie Blanche: No Cyanosis: No Cyanosis: No Ecchymosis: No Ecchymosis: No Erythema: No Erythema: No Hemosiderin Staining: No Mottled: No Mottled: No Pallor: No Pallor: No Rubor: No Rubor: No No Abnormality N/A N/A Temperature: Compression Therapy Debridement N/A Procedures Performed: Debridement Treatment Notes Wound #10 (Lower Leg) Wound Laterality: Left, Medial Cleanser Soap and Water Discharge Instruction: May shower and wash wound with dial antibacterial soap and water prior to dressing change. Wound Cleanser Discharge Instruction: Cleanse the wound with wound cleanser prior to applying a clean dressing using gauze sponges, not tissue or cotton balls. Peri-Wound Care Sween Lotion (Moisturizing lotion) Discharge Instruction: Apply moisturizing lotion as directed Topical Primary Dressing Promogran Prisma Matrix, 4.34 (sq in) (silver collagen) Discharge Instruction: Moisten collagen with hydrogel Secondary Dressing Woven Gauze Sponge, Non-Sterile 4x4 in Discharge Instruction: Apply over  primary dressing as directed. Secured With Compression Wrap FourPress (4 layer compression wrap) Discharge Instruction: Apply four layer compression as directed. Apply first layer of unna boot to upper portion of lower leg. Compression Stockings Add-Ons Wound #9 (Groin) Wound Laterality: Left Cleanser Soap and Water Discharge Instruction: May shower and wash wound with dial antibacterial soap and water prior to dressing change. Peri-Wound Care Topical Primary Dressing Promogran Prisma Matrix, 4.34 (sq in) (silver collagen) Discharge Instruction: Moisten collagen with saline or hydrogel Secondary  Dressing Woven Gauze Sponge, Non-Sterile 4x4 in Discharge Instruction: Apply over primary dressing as directed. Secured With 52M Medipore H Soft Cloth Surgical T ape, 4 x 10 (in/yd) Discharge Instruction: Secure with tape as directed. Compression Wrap Compression Stockings Add-Ons JORENE, KAYLOR D ([id number]) [id number] Page 5 of 10 Electronic Signature(s) Signed: 03/12/2022 11:32:28 AM By: Fredirick Maudlin MD FACS Entered By: Fredirick Maudlin on 03/12/2022 11:32:28 -------------------------------------------------------------------------------- Multi-Disciplinary Care Plan Details Patient Name: Date of Service: [name] 03/12/2022 10:30 A M Medical Record Number: [id number] Patient Account Number: [id number] Date of Birth/Sex: Treating RN: Sep 08, 1970 (51 y.o. Elam [name] Primary Care Solan Vosler: [name] Other Clinician: Referring Hermila Millis: Treating Sorcha Rotunno/Extender: Annice Needy in Treatment: 1 Marlin reviewed with physician Active Inactive Pain, Acute or Chronic Nursing Diagnoses: Pain, acute or chronic: actual or potential Potential alteration in comfort, pain Goals: Patient will verbalize adequate pain control and receive pain control interventions during procedures as needed Date Initiated: 03/05/2022 Target Resolution Date: 04/05/2022 Goal Status: Active Patient/caregiver will verbalize comfort level met Date Initiated: 03/05/2022 Target Resolution Date: 04/05/2022 Goal Status: Active Interventions: Encourage patient to take pain medications as prescribed Provide education on pain management Treatment Activities: Administer pain control measures as ordered : 03/05/2022 Notes: Wound/Skin Impairment Nursing Diagnoses: Knowledge deficit related to ulceration/compromised skin integrity Goals: Patient/caregiver will verbalize understanding of skin care regimen Date  Initiated: 03/05/2022 Target Resolution Date: 04/05/2022 Goal Status: Active Interventions: Assess patient/caregiver ability to obtain necessary supplies Assess patient/caregiver ability to perform ulcer/skin care regimen upon admission and as needed Provide education on ulcer and skin care Treatment Activities: Skin care regimen initiated : 03/05/2022 Topical wound management initiated : 03/05/2022 Notes: Electronic Signature(s) Signed: 03/12/2022 5:53:41 PM By: Baruch Gouty RN, BSN Entered By: Baruch Gouty on 03/12/2022 11:08:29 [name] ([id number]) [id number] Page 6 of 10 -------------------------------------------------------------------------------- Pain Assessment Details Patient Name: Date of Service: LATONDRA, GEBHART D. 03/12/2022 10:30 A M Medical Record Number: [id number] Patient Account Number: [id number] Date of Birth/Sex: Treating RN: [birth year] (51 y.o. Marta Lamas Primary Care Selassie Spatafore: [name] Other Clinician: Referring Myrtle Barnhard: Treating Lathyn Griggs/Extender: Annice Needy in Treatment: 1 Active Problems Location of Pain Severity and Description of Pain Patient Has Paino Yes Site Locations Pain Location: Pain in Ulcers Duration of the Pain. Constant / Intermittento Constant Rate the pain. Current Pain Level: 9 Pain Management and Medication Current Pain Management: Medication: Yes Electronic Signature(s) Signed: 03/12/2022 3:06:09 PM By: Blanche East RN Entered By: Blanche East on 03/12/2022 10:46:00 -------------------------------------------------------------------------------- Patient/Caregiver Education Details Patient Name: Date of Service: [name] 11/14/2023andnbsp10:30 A M Medical Record Number: [id number] Patient Account Number: [id number] Date of Birth/Gender: Treating RN: 09-Aug-1970 (51 y.o. Elam [name] Primary Care Physician: [name]  Other Clinician: Referring Physician: Treating Physician/Extender: Annice Needy in Treatment: 1 Education Assessment Education Provided To: Patient Education Topics Provided Venous: Methods: Explain/Verbal Responses: Reinforcements needed, State content correctly [name] (  [id number]) [id number] Page 7 of 10 Wound/Skin Impairment: Methods: Explain/Verbal Responses: Reinforcements needed, State content correctly Electronic Signature(s) Signed: 03/12/2022 5:53:41 PM By: Baruch Gouty RN, BSN Entered By: Baruch Gouty on 03/12/2022 11:08:51 -------------------------------------------------------------------------------- Wound Assessment Details Patient Name: Date of Service: [name] 03/12/2022 10:30 A M Medical Record Number: [id number] Patient Account Number: [id number] Date of Birth/Sex: Treating RN: 10/02/1970 (51 y.o. Iver Nestle, [name] Primary Care Bobbi Yount: [name] Other Clinician: Referring Ritchard Paragas: Treating Sylvie Mifsud/Extender: Annice Needy in Treatment: 1 Wound Status Wound Number: 10 Primary Diabetic Wound/Ulcer of the Lower Extremity Etiology: Wound Location: Left, Medial Lower Leg Secondary Lymphedema Wounding Event: Gradually Appeared Etiology: Date Acquired: 02/09/2022 Wound Open Weeks Of Treatment: 1 Status: Clustered Wound: Yes Comorbid Lymphedema, Sleep Apnea, Congestive Heart Failure, Coronary History: Artery Disease, Hypertension, Type II Diabetes Photos Wound Measurements Length: (cm) Width: (cm) Depth: (cm) Clustered Quantity: Area: (cm) Volume: (cm) 1 % Reduction in Area: 91.6% 0.5 % Reduction in Volume: 91.6% 0.1 Epithelialization: Large (67-100%) 2 Tunneling: No 0.393 Undermining: No 0.039 Wound Description Classification: Grade 1 Wound Margin: Distinct, outline attached Exudate Amount: Small Exudate Type:  Serosanguineous Exudate Color: red, brown Foul Odor After Cleansing: No Slough/Fibrino Yes Wound Bed Granulation Amount: Large (67-100%) Exposed Structure Granulation Quality: Red, Pink Fascia Exposed: No Necrotic Amount: Small (1-33%) Fat Layer (Subcutaneous Tissue) Exposed: Yes Necrotic Quality: Eschar, Adherent Slough Tendon Exposed: No Muscle Exposed: No Joint Exposed: No Bone Exposed: No MADINE, SARR D ([id number]) [id number] Page 8 of 10 Periwound Skin Texture Texture Color No Abnormalities Noted: No No Abnormalities Noted: No Callus: No Atrophie Blanche: No Crepitus: No Cyanosis: No Excoriation: No Ecchymosis: No Induration: No Erythema: No Rash: No Hemosiderin Staining: Yes Scarring: Yes Mottled: No Pallor: No Moisture Rubor: No No Abnormalities Noted: No Dry / Scaly: No Temperature / Pain Maceration: No Temperature: No Abnormality Treatment Notes Wound #10 (Lower Leg) Wound Laterality: Left, Medial Cleanser Soap and Water Discharge Instruction: May shower and wash wound with dial antibacterial soap and water prior to dressing change. Wound Cleanser Discharge Instruction: Cleanse the wound with wound cleanser prior to applying a clean dressing using gauze sponges, not tissue or cotton balls. Peri-Wound Care Sween Lotion (Moisturizing lotion) Discharge Instruction: Apply moisturizing lotion as directed Topical Primary Dressing Promogran Prisma Matrix, 4.34 (sq in) (silver collagen) Discharge Instruction: Moisten collagen with hydrogel Secondary Dressing Woven Gauze Sponge, Non-Sterile 4x4 in Discharge Instruction: Apply over primary dressing as directed. Secured With Compression Wrap FourPress (4 layer compression wrap) Discharge Instruction: Apply four layer compression as directed. Apply first layer of unna boot to upper portion of lower leg. Compression Stockings Add-Ons Electronic Signature(s) Signed: 03/12/2022  3:06:09 PM By: Blanche East RN Signed: 03/12/2022 3:48:01 PM By: Sabas Sous By: Adline Peals on 03/12/2022 10:55:32 -------------------------------------------------------------------------------- Wound Assessment Details Patient Name: Date of Service: [name] 03/12/2022 10:30 A M Medical Record Number: [id number] Patient Account Number: [id number] Date of Birth/Sex: Treating RN: [birth year] (51 y.o. Marta Lamas Primary Care Jazzalyn Loewenstein: [name] Other Clinician: Referring Renan Danese: Treating Morgana Rowley/Extender: Annice Needy in Treatment: 1 Wound Status Wound Number: 9 Primary Abrasion Etiology: Wound Location: Left Groin Wound Open Wounding Event: Not Known Status: OLUWASEUN, BRUYERE ([id number]) [id number].pdf Page 9 of 10 Status: Date Acquired: 03/02/2022 Comorbid Lymphedema, Sleep Apnea, Congestive Heart Failure, Coronary Weeks Of Treatment: 1 History: Artery Disease, Hypertension, Type II Diabetes Clustered Wound: No Photos Wound Measurements Length: (cm) 0.3 Width: (cm) 1 Depth: (cm)  0.1 Area: (cm) 0.236 Volume: (cm) 0.024 % Reduction in Area: 79.1% % Reduction in Volume: 78.8% Epithelialization: Medium (34-66%) Tunneling: No Undermining: No Wound Description Classification: Full Thickness Without Exposed Suppor Wound Margin: Distinct, outline attached Exudate Amount: Small Exudate Type: Serosanguineous Exudate Color: red, brown t Structures Foul Odor After Cleansing: No Slough/Fibrino Yes Wound Bed Granulation Amount: Large (67-100%) Exposed Structure Granulation Quality: Red, Pink Fascia Exposed: No Necrotic Amount: Small (1-33%) Fat Layer (Subcutaneous Tissue) Exposed: Yes Necrotic Quality: Adherent Slough Tendon Exposed: No Muscle Exposed: No Joint Exposed: No Bone Exposed: No Periwound Skin Texture Texture Color No Abnormalities Noted: No No  Abnormalities Noted: No Callus: No Atrophie Blanche: No Crepitus: No Cyanosis: No Excoriation: No Ecchymosis: No Induration: No Erythema: No Rash: No Hemosiderin Staining: No Scarring: Yes Mottled: No Pallor: No Moisture Rubor: No No Abnormalities Noted: No Dry / Scaly: No Maceration: No Treatment Notes Wound #9 (Groin) Wound Laterality: Left Cleanser Soap and Water Discharge Instruction: May shower and wash wound with dial antibacterial soap and water prior to dressing change. Peri-Wound Care Topical Primary Dressing Promogran Prisma Matrix, 4.34 (sq in) (silver collagen) Discharge Instruction: Moisten collagen with saline or hydrogel Secondary Dressing Woven Gauze Sponge, Non-Sterile 4x4 in Discharge Instruction: Apply over primary dressing as directed. CORIANN, BROUHARD D ([id number]) [id number] Page 10 of 10 Secured With 25M Medipore H Soft Cloth Surgical T ape, 4 x 10 (in/yd) Discharge Instruction: Secure with tape as directed. Compression Wrap Compression Stockings Add-Ons Electronic Signature(s) Signed: 03/12/2022 3:06:09 PM By: Blanche East RN Signed: 03/12/2022 3:48:01 PM By: Sabas Sous By: Adline Peals on 03/12/2022 10:55:51 -------------------------------------------------------------------------------- Vitals Details Patient Name: Date of Service: [name], [name] 03/12/2022 10:30 A M Medical Record Number: [id number] Patient Account Number: [id number] Date of Birth/Sex: Treating RN: 11-Apr-1971 (51 y.o. Iver Nestle, [name] Primary Care Kalila Adkison: [name] Other Clinician: Referring Rolando Hessling: Treating Kiely Cousar/Extender: Annice Needy in Treatment: 1 Vital Signs Time Taken: 10:47 Temperature (F): 97.5 Height (in): 69 Pulse (bpm): 90 Weight (lbs): 300 Respiratory Rate (breaths/min): 20 Body Mass Index (BMI): 44.3 Blood Pressure (mmHg): 125/84 Capillary Blood  Glucose (mg/dl): 154 Reference Range: 80 - 120 mg / dl Electronic Signature(s) Signed: 03/12/2022 3:06:09 PM By: Blanche East RN Entered By: Blanche East on 03/12/2022 10:47:32

## 2022-03-13 NOTE — Progress Notes (Signed)
[name], [name] ([id number]) [id number] Page 1 of 12 Visit Report for 03/12/2022 Chief Complaint Document Details Patient Name: Date of Service: [name], [name]. 03/12/2022 10:30 A M Medical Record Number: [id number] Patient Account Number: [id number] Date of Birth/Sex: Treating RN: 11-12-1970 (51 y.o. F) Primary Care Provider: Karle [name] Other Clinician: Referring Provider: Treating Provider/Extender: [name] in Treatment: 1 Information Obtained from: Patient Chief Complaint 07/23/2021; Left lower extremity wound 03/05/2022: Returns with a wound in the same location on her left lower extremity, as well as 1 in her left groin. Electronic Signature(s) Signed: 03/12/2022 11:32:35 AM By: Megan Maudlin MD FACS Entered By: [name] on 03/12/2022 11:32:35 -------------------------------------------------------------------------------- Debridement Details Patient Name: Date of Service: [name]. 03/12/2022 10:30 A M Medical Record Number: [id number] Patient Account Number: [id number] Date of Birth/Sex: Treating RN: 10/11/1970 (51 y.o. [name] Primary Care Provider: Karle [name] Other Clinician: Referring Provider: Treating Provider/Extender: [name] in Treatment: 1 Debridement Performed for Assessment: Wound #10 Left,Medial Lower Leg Performed By: Physician Megan Maudlin, MD Debridement Type: Debridement Severity of Tissue Pre Debridement: Fat layer exposed Level of Consciousness (Pre-procedure): Awake and Alert Pre-procedure Verification/Time Out Yes - 11:05 Taken: Start Time: 11:07 Pain Control: Lidocaine 4% T opical Solution T Area Debrided (L x W): otal 1 (cm) x 0.5 (cm) = 0.5 (cm) Tissue and other material debrided: Non-Viable, [place], [place], Other: old dressing Level: Non-Viable Tissue Debridement Description: Selective/Open  Wound Instrument: Curette Bleeding: Minimum Hemostasis Achieved: Pressure Procedural Pain: 0 Post Procedural Pain: 0 Response to Treatment: Procedure was tolerated well Level of Consciousness (Post- Awake and Alert procedure): Post Debridement Measurements of Total Wound Length: (cm) 1 Width: (cm) 0.5 Depth: (cm) 0.1 Volume: (cm) 0.039 Character of Wound/Ulcer Post Debridement: Improved [name] [name] ([id number]) [id number] Page 2 of 12 Severity of Tissue Post Debridement: Fat layer exposed Post Procedure Diagnosis Same as Pre-procedure Notes scribed by Megan Gouty, RN for Dr. Celine [name] Electronic Signature(s) Signed: 03/12/2022 12:07:49 PM By: Megan Maudlin MD FACS Signed: 03/12/2022 5:53:41 PM By: Megan Gouty RN, BSN Entered By: [name] on 03/12/2022 11:55:57 -------------------------------------------------------------------------------- Debridement Details Patient Name: Date of Service: [name], [name]. 03/12/2022 10:30 A M Medical Record Number: [id number] Patient Account Number: [id number] Date of Birth/Sex: Treating RN: [birth year] (51 y.o. [name], [name] Primary Care Provider: Karle [name] Other Clinician: Referring Provider: Treating Provider/Extender: [name] in Treatment: 1 Debridement Performed for Assessment: Wound #9 Left Groin Performed By: Physician Megan Maudlin, MD Debridement Type: Debridement Level of Consciousness (Pre-procedure): Awake and Alert Pre-procedure Verification/Time Out Yes - 11:05 Taken: Start Time: 11:07 Pain Control: Lidocaine 4% T opical Solution T Area Debrided (L x W): otal 0.3 (cm) x 1 (cm) = 0.3 (cm) Tissue and other material debrided: Non-Viable, [place] Level: Non-Viable Tissue Debridement Description: Selective/Open Wound Instrument: Curette Bleeding: Minimum Hemostasis Achieved: Pressure Procedural Pain: 0 Post  Procedural Pain: 0 Response to Treatment: Procedure was tolerated well Level of Consciousness (Post- Awake and Alert procedure): Post Debridement Measurements of Total Wound Length: (cm) 0.3 Width: (cm) 1 Depth: (cm) 0.1 Volume: (cm) 0.024 Character of Wound/Ulcer Post Debridement: Improved Post Procedure Diagnosis Same as Pre-procedure Notes scribed by Megan Gouty, RN for Dr. Celine [name] Electronic Signature(s) Signed: 03/12/2022 12:07:49 PM By: Megan Maudlin MD FACS Signed: 03/12/2022 5:53:41 PM By: Megan Gouty RN, BSN Entered By: [name] on 03/12/2022 11:56:08 [name] ([id number]) [id number] Page 3 of  12 -------------------------------------------------------------------------------- HPI Details Patient Name: Date of Service: [name], [name]. 03/12/2022 10:30 A M Medical Record Number: [id number] Patient Account Number: [id number] Date of Birth/Sex: Treating RN: 11-13-1970 (51 y.o. F) Primary Care Provider: Karle [name] Other Clinician: Referring Provider: Treating Provider/Extender: [name] in Treatment: 1 History of Present Illness HPI Description: Admission 6/24 [name] is a 51 year old female with a past medical history of chronic venous insufficiency, lymphedema, DVT on anticoagulation and diastolic heart failure that presents to the clinic for left lower extremity wound. She was last seen 4 months ago in our clinic for the same issue. The reoccurring wound started at the end of May and has been going on for 1 month. She has been using an ointment and I am unclear what this is. She tries to keep her leg elevated with her compression stocking. She also reports she has lymphedema pumps and has been using them as well. She reports mild pain to the area. She denies signs of infection including increased warmth, erythema or purulent drainage. 7/1; patient presents for 1 week  follow-up. She has tolerated the wrap well. Unfortunately she did have trouble with the wrap Sliding down her leg 2 days ago. She has no issues or complaints today. She denies signs of infection. 7/15; patient presents for follow-up. She has tolerated the wrap well. She has no issues or complaints today. She denies signs of infection. 7/25; patient presents for 1 week follow-up. She has no issues or complaints today. She denies signs of infection. 8/4; patient presents for follow-up. She has tolerated the compression wrap well to her left lower extremity. She states she was in the hospital last week due to fluid overload. She subsequently developed blisters to her bottom from being in the hospital bed for prolonged periods of time. These have since ruptured. She now has 2 areas of skin breakdown. She has been keeping gauze on them. She denies signs of infection. 8/18; patient presents for 2-week follow-up. She reports improvement to her buttocks wounds. She is able to tolerate the wrap well to her left lower extremity with no issues. She denies signs of infection. 9/1; patient presents for 2-week follow-up. She has no issues or complaints today. she denies signs of infection. 9/15; patient presents for 2-week follow-up. She has no issues or complaints today. She has tolerated the wrap well. She denies signs of infection. 9/27; patient presents for 1 week follow-up. She has no issues or complaints today. 10/20; patient presents for follow-up. She was on vacation for the past 3 weeks. She states she had the last clinic visit wrap in place for 1 week and then she took this off. She states that she has been using her compression wraps since. She states that the wound has gotten larger. She reports pain to the wound site. She denies fever/chills, nausea/vomiting. She denies purulent drainage. 10/24; patient presents for follow-up. She states she tolerated the Kerlix/Coban wrap well. She reports decrease in  pain. She states she started Keflex. She denies systemic signs of infection. 10/28; patient presents for follow-up. She has no issues or complaints today. She denies acute pain. She tolerated the wrap well. 11/4; patient presents for follow-up. She has no issues or complaints. She denies signs of infection. 11/11; patient presents for follow-up. She has no issues or complaints today. She denies signs of infection. She tolerated the wrap well. 11/18; patient presents for follow-up. She states she took a shower however despite a  plastic bag the wrap got wet. She currently denies signs of infection. 12/2; patient presents for follow-up. She tolerated the wrap well. She has no issues or complaints today. 12/12; patient presents for follow-up. She has no issues or complaints today. Readmission 07/23/2021 Ms. Dyani Babel is a 51 year old female with a past medical history of chronic venous insufficiency that presents to the clinic for a left lower extremity wound. She was seen previously in the clinic for the same wound and this was healed on 04/09/2021. Since then she has been wearing her compression stockings intermittently. She reports more swelling to her left lower extremity over the past month and she stopped wearing her compression stocking. Subsequently she developed a wound. Her Lasix has been increased and she has noticed an improvement in her swelling. She currently denies signs of infection. 4/6; patient presents for follow-up. She states that the wrap stayed in place for 2 days before it rolled down and she had to take it off. She has compression stockings however states she could not get these on due to the swelling. She denies signs of infection. 4/13; patient with chronic venous insufficiency that has been in this clinic previously. She has a small pair of open areas on the left lower leg in the middle of previous scar tissue from I believe a previous wound. Most concerning today is how  poor her edema control is above the sclerotic skin in her lower extremity. This is nonpitting and compatible with lymphedema. She tells me that she was using a juxta lite stocking although it was old we are apparently replacing this. She does not seem aware of how to adjust the tension with this and I think she is going to require 30/40 mmHg equivalent compression She has her arterial studies tomorrow at 2:00 4/25; patient presents for follow-up. ABIs done on 4/14 showed a left ABI of 1.04 and TBI of 0.99. She tolerated the compression wrap well This week. She states she is going to obtain compression stockings today. She has no issues or complaints today. 5/2; patient presents for follow-up. She states her compression stockings are being delivered today. Her wound is closed today. READMISSION 03/05/2022 [name], [name] ([id number]) [id number] Page 4 of 12 Despite having worn her juxta lite stockings, the patient reports that the area on her left medial lower leg reopened a couple of weeks ago. She says that she has been applying lotion and leaving it open to air to dry. She also had a new wound open in her left groin. She is not sure how this started, but says it may have been an ingrown hair or pimple. There is slough accumulation in both sites. The leg wound is fairly desiccated. 03/12/2022: After our visit last week, she presented to the emergency department very short of breath. She was thought to be having a CHF exacerbation but was also influenza A positive. She was admitted to [facility] for about a week. She was just discharged yesterday morning. Both wounds are smaller today. The leg wound has epithelialized substantially with just 2 open areas. Edema control is excellent. The groin wound has also contracted. There is a little slough on the surface. Electronic Signature(s) Signed: 03/12/2022 11:35:35 AM By: Megan Maudlin MD FACS Entered By:  [name] on 03/12/2022 11:35:34 -------------------------------------------------------------------------------- Physical Exam Details Patient Name: Date of Service: [name]. 03/12/2022 10:30 A M Medical Record Number: [id number] Patient Account Number: [id number] Date of Birth/Sex: Treating RN: 1970/06/14 (51 y.o.  F) Primary Care Provider: Karle [name] Other Clinician: Referring Provider: Treating Provider/Extender: [name] in Treatment: 1 Constitutional . . . . No acute distress.[name] Respiratory Normal work of breathing on room air.. Notes 03/12/2022: Both wounds are smaller today. The leg wound has epithelialized substantially with just 2 open areas. Edema control is excellent. The groin wound has also contracted. There is a little slough on the surface. Electronic Signature(s) Signed: 03/12/2022 11:42:55 AM By: Megan Maudlin MD FACS Entered By: [name] on 03/12/2022 11:42:55 -------------------------------------------------------------------------------- Physician Orders Details Patient Name: Date of Service: [name]. 03/12/2022 10:30 A M Medical Record Number: [id number] Patient Account Number: [id number] Date of Birth/Sex: Treating RN: 09/24/1970 (51 y.o. 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 - Dr. Celine [name] RM 1 Return A Nurse Visit: - 1 week Monday Anesthetic Wound #10 Left,Medial Lower Leg (In clinic) Topical Lidocaine 4% applied to wound bed Wound #9 Left Groin (In clinic) Topical Lidocaine 4% applied to wound bed Bathing/ Shower/ Hygiene May shower with protection but do not get wound dressing(s) wet. Edema Control - Lymphedema / SCD / Other Elevate legs to the level of the heart or above for 30 minutes daily and/or when sitting, a frequency of: - 3-4 times a day throughout the day. Avoid standing for long periods of time. Exercise regularly Wound Treatment Wound #10 - Lower Leg Wound Laterality: Left, Medial Cleanser: Soap and Water 1 x Per Week/30 Days Discharge Instructions: May shower and wash wound with dial antibacterial soap and water prior to dressing change. Cleanser: Wound Cleanser 1 x Per Week/30 Days Discharge Instructions: Cleanse the wound with wound cleanser prior to applying a clean dressing using gauze sponges, not tissue or cotton balls. Peri-Wound Care: Sween Lotion (Moisturizing lotion) 1 x Per Week/30 Days Discharge Instructions: Apply moisturizing lotion as directed Prim Dressing: Promogran Prisma Matrix, 4.34 (sq in) (silver collagen) ary 1 x Per Week/30 Days Discharge Instructions: Moisten collagen with hydrogel Secondary Dressing: Woven Gauze Sponge, Non-Sterile 4x4 in 1 x Per Week/30 Days Discharge Instructions: Apply over primary dressing as directed. Compression Wrap: FourPress (4 layer compression wrap) 1 x Per Week/30 Days Discharge Instructions: Apply four layer compression as directed. Apply first layer of unna boot to upper portion of lower leg. Wound #9 - Groin Wound Laterality: Left Cleanser: Soap and Water 1 x Per Day/30 Days Discharge Instructions: May shower and wash wound with dial antibacterial soap and water prior to dressing  change. Prim Dressing: KerraCel Ag Gelling Fiber Dressing, 4x5 in (silver alginate) 1 x Per Day/30 Days ary Discharge Instructions: Apply silver alginate to wound bed as instructed Secondary Dressing: Woven Gauze Sponge, Non-Sterile 4x4 in 1 x Per Day/30 Days Discharge Instructions: Apply over primary dressing as directed. Secured With: 24M Medipore H Soft Cloth Surgical T ape, 4 x 10 (in/yd) 1 x Per Day/30 Days Discharge Instructions: Secure with tape as directed. Patient Medications llergies: ACE Inhibitors, hydromorphone, vancomycin, ferrous sulfate  A Notifications Medication Indication Start End prior to debridement 03/12/2022 lidocaine DOSE topical 4 % cream - cream topical Electronic Signature(s) Signed: 03/12/2022 12:07:49 PM By: Megan Maudlin MD FACS Entered By: [name] on 03/12/2022 11:43:04 Problem List Details -------------------------------------------------------------------------------- [name] ([id number]) [id number] Page 6 of 12 Patient Name: Date of Service: [name], [name]. 03/12/2022 10:30 A M Medical Record Number: [id number] Patient Account Number: [id number] Date of Birth/Sex: Treating RN: December 30, 1970 (51 y.o. 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 03/05/2022 No Yes I50.30 Unspecified diastolic (congestive) heart failure 03/05/2022 No Yes I87.2 Venous insufficiency (chronic) (peripheral) 03/05/2022 No Yes I89.0 Lymphedema, not elsewhere classified 03/05/2022 No Yes E66.01 Morbid (severe) obesity due to excess calories  03/05/2022 No Yes Inactive Problems Resolved Problems Electronic Signature(s) Signed: 03/12/2022 11:32:23 AM By: Megan Maudlin MD FACS Entered By: [name] on 03/12/2022 11:32:23 -------------------------------------------------------------------------------- Progress Note Details Patient Name: Date of Service: [name], [name]. 03/12/2022 10:30 A M Medical Record Number: [id number] Patient Account Number: [id number] Date of Birth/Sex: Treating RN: [birth year] (51 y.o. F) Primary Care Provider: Karle [name] Other Clinician: Referring Provider: Treating Provider/Extender: [name] in Treatment: 1 Subjective Chief Complaint Information obtained from Patient 07/23/2021; Left lower extremity wound 03/05/2022: Returns with a wound in the same location on her left lower extremity, as well as 1 in her left groin. History of Present Illness (HPI) Admission 6/24 Ms. Tinita Brooker is a 51 year old female with a past medical history of chronic venous insufficiency, lymphedema, DVT on anticoagulation and diastolic heart failure that presents to the clinic for left lower extremity wound. She was last seen 4 months ago in our clinic for the same issue. The reoccurring wound started at the end of May and has been going on for 1 month. She has been using an ointment and I am unclear what this is. She tries to keep her leg elevated with her compression stocking. She also reports she has lymphedema pumps and has been using them as well. KALII, CHESMORE [name] ([id number]) [id number] Page 7 of 12 She reports mild pain to the area. She denies signs of infection including increased warmth, erythema or purulent drainage. 7/1; patient presents for 1 week follow-up. She has tolerated the wrap well. Unfortunately she did have trouble with the wrap Sliding down her leg 2 days ago. She has no issues or complaints today. She denies signs of  infection. 7/15; patient presents for follow-up. She has tolerated the wrap well. She has no issues or complaints today. She denies signs of infection. 7/25; patient presents for 1 week follow-up. She has no issues or complaints today. She denies signs of infection. 8/4; patient presents for follow-up. She has tolerated the compression wrap well to her left lower extremity. She states she was in the hospital last week due to fluid overload. She subsequently developed blisters to her bottom from being in the hospital bed for prolonged periods of time. These have since ruptured. She now has 2 areas of skin breakdown. She has been keeping gauze on them. She denies signs of infection. 8/18; patient presents for 2-week follow-up. She reports improvement to her buttocks wounds. She is able to  tolerate the wrap well to her left lower extremity with no issues. She denies signs of infection. 9/1; patient presents for 2-week follow-up. She has no issues or complaints today. she denies signs of infection. 9/15; patient presents for 2-week follow-up. She has no issues or complaints today. She has tolerated the wrap well. She denies signs of infection. 9/27; patient presents for 1 week follow-up. She has no issues or complaints today. 10/20; patient presents for follow-up. She was on vacation for the past 3 weeks. She states she had the last clinic visit wrap in place for 1 week and then she took this off. She states that she has been using her compression wraps since. She states that the wound has gotten larger. She reports pain to the wound site. She denies fever/chills, nausea/vomiting. She denies purulent drainage. 10/24; patient presents for follow-up. She states she tolerated the Kerlix/Coban wrap well. She reports decrease in pain. She states she started Keflex. She denies systemic signs of infection. 10/28; patient presents for follow-up. She has no issues or complaints today. She denies acute pain. She  tolerated the wrap well. 11/4; patient presents for follow-up. She has no issues or complaints. She denies signs of infection. 11/11; patient presents for follow-up. She has no issues or complaints today. She denies signs of infection. She tolerated the wrap well. 11/18; patient presents for follow-up. She states she took a shower however despite a plastic bag the wrap got wet. She currently denies signs of infection. 12/2; patient presents for follow-up. She tolerated the wrap well. She has no issues or complaints today. 12/12; patient presents for follow-up. She has no issues or complaints today. Readmission 07/23/2021 Ms. Anaija Wissink is a 51 year old female with a past medical history of chronic venous insufficiency that presents to the clinic for a left lower extremity wound. She was seen previously in the clinic for the same wound and this was healed on 04/09/2021. Since then she has been wearing her compression stockings intermittently. She reports more swelling to her left lower extremity over the past month and she stopped wearing her compression stocking. Subsequently she developed a wound. Her Lasix has been increased and she has noticed an improvement in her swelling. She currently denies signs of infection. 4/6; patient presents for follow-up. She states that the wrap stayed in place for 2 days before it rolled down and she had to take it off. She has compression stockings however states she could not get these on due to the swelling. She denies signs of infection. 4/13; patient with chronic venous insufficiency that has been in this clinic previously. She has a small pair of open areas on the left lower leg in the middle of previous scar tissue from I believe a previous wound. Most concerning today is how poor her edema control is above the sclerotic skin in her lower extremity. This is nonpitting and compatible with lymphedema. She tells me that she was using a juxta lite stocking  although it was old we are apparently replacing this. She does not seem aware of how to adjust the tension with this and I think she is going to require 30/40 mmHg equivalent compression She has her arterial studies tomorrow at 2:00 4/25; patient presents for follow-up. ABIs done on 4/14 showed a left ABI of 1.04 and TBI of 0.99. She tolerated the compression wrap well This week. She states she is going to obtain compression stockings today. She has no issues or complaints today. 5/2; patient presents for  follow-up. She states her compression stockings are being delivered today. Her wound is closed today. READMISSION 03/05/2022 Despite having worn her juxta lite stockings, the patient reports that the area on her left medial lower leg reopened a couple of weeks ago. She says that she has been applying lotion and leaving it open to air to dry. She also had a new wound open in her left groin. She is not sure how this started, but says it may have been an ingrown hair or pimple. There is slough accumulation in both sites. The leg wound is fairly desiccated. 03/12/2022: After our visit last week, she presented to the emergency department very short of breath. She was thought to be having a CHF exacerbation but was also influenza A positive. She was admitted to [facility] for about a week. She was just discharged yesterday morning. Both wounds are smaller today. The leg wound has epithelialized substantially with just 2 open areas. Edema control is excellent. The groin wound has also contracted. There is a little slough on the surface. Patient History Information obtained from Patient. Family History Diabetes - Mother,Maternal Grandparents, Heart Disease - Paternal Grandparents, Hypertension - Mother,Maternal Grandparents, Lung Disease - Father,Paternal Grandparents, No family history of Cancer, Hereditary Spherocytosis, Kidney Disease, Seizures, Stroke, Thyroid Problems, Tuberculosis. Social  History Former smoker, Marital Status - Single, Alcohol Use - Never, Drug Use - No History, Caffeine Use - Daily. Medical History Eyes Denies history of Cataracts, Glaucoma, Optic Neuritis Ear/Nose/Mouth/Throat Denies history of Chronic sinus problems/congestion, Middle ear problems Hematologic/Lymphatic [name], [name] ([id number]) [id number] Page 8 of 12 Patient has history of Lymphedema Denies history of Anemia, Hemophilia, Human Immunodeficiency Virus, Sickle Cell Disease Respiratory Patient has history of Sleep Apnea Denies history of Aspiration, Asthma, Chronic Obstructive Pulmonary Disease (COPD), Pneumothorax, Tuberculosis Cardiovascular Patient has history of Congestive Heart Failure, Coronary Artery Disease, Hypertension Denies history of Angina, Arrhythmia, Deep Vein Thrombosis, Hypotension, Myocardial Infarction, Peripheral Arterial Disease, Peripheral Venous Disease, Phlebitis, Vasculitis Gastrointestinal Denies history of Cirrhosis , Colitis, Crohnoos, Hepatitis A, Hepatitis B, Hepatitis C Endocrine Patient has history of Type II Diabetes Denies history of Type I Diabetes Genitourinary Denies history of End Stage Renal Disease Immunological Denies history of Lupus Erythematosus, Raynaudoos, Scleroderma Integumentary (Skin) Denies history of History of Burn Musculoskeletal Denies history of Gout, Rheumatoid Arthritis, Osteoarthritis, Osteomyelitis Neurologic Denies history of Dementia, Neuropathy, Quadriplegia, Paraplegia, Seizure Disorder Oncologic Denies history of Received Chemotherapy, Received Radiation Psychiatric Denies history of Anorexia/bulimia, Confinement Anxiety Objective Constitutional No acute distress.. Vitals Time Taken: 10:47 AM, Height: 69 in, Weight: 300 lbs, BMI: 44.3, Temperature: 97.5 F, Pulse: 90 bpm, Respiratory Rate: 20 breaths/min, Blood Pressure: 125/84 mmHg, Capillary Blood Glucose: 154  mg/dl. Respiratory Normal work of breathing on room air.. General Notes: 03/12/2022: Both wounds are smaller today. The leg wound has epithelialized substantially with just 2 open areas. Edema control is excellent. The groin wound has also contracted. There is a little slough on the surface. Integumentary (Hair, Skin) Wound #10 status is Open. Original cause of wound was Gradually Appeared. The date acquired was: 02/09/2022. The wound has been in treatment 1 weeks. The wound is located on the Left,Medial Lower Leg. The wound measures 1cm length x 0.5cm width x 0.1cm depth; 0.393cm^2 area and 0.039cm^3 volume. There is Fat Layer (Subcutaneous Tissue) exposed. There is no tunneling or undermining noted. There is a small amount of serosanguineous drainage noted. The wound margin is distinct with the outline attached to the wound base. There is  large (67-100%) red, pink granulation within the wound bed. There is a small (1-33%) amount of necrotic tissue within the wound bed including Eschar and Adherent Slough. The periwound skin appearance exhibited: Scarring, Hemosiderin Staining. The periwound skin appearance did not exhibit: Callus, Crepitus, Excoriation, Induration, Rash, Dry/Scaly, Maceration, Atrophie Blanche, Cyanosis, Ecchymosis, Mottled, Pallor, Rubor, Erythema. Periwound temperature was noted as No Abnormality. Wound #9 status is Open. Original cause of wound was Not Known. The date acquired was: 03/02/2022. The wound has been in treatment 1 weeks. The wound is located on the Left Groin. The wound measures 0.3cm length x 1cm width x 0.1cm depth; 0.236cm^2 area and 0.024cm^3 volume. There is Fat Layer (Subcutaneous Tissue) exposed. There is no tunneling or undermining noted. There is a small amount of serosanguineous drainage noted. The wound margin is distinct with the outline attached to the wound base. There is large (67-100%) red, pink granulation within the wound bed. There is a small  (1-33%) amount of necrotic tissue within the wound bed including Adherent Slough. The periwound skin appearance exhibited: Scarring. The periwound skin appearance did not exhibit: Callus, Crepitus, Excoriation, Induration, Rash, Dry/Scaly, Maceration, Atrophie Blanche, Cyanosis, Ecchymosis, Hemosiderin Staining, Mottled, Pallor, Rubor, Erythema. 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 There was a Selective/Open Wound Non-Viable Tissue Debridement with a total area of 0.5 sq cm performed by Megan Maudlin, MD. With the following instrument(s): Curette to remove Non-Viable tissue/material. Material removed includes Eschar, Slough, and Other: old dressing after achieving pain control using Lidocaine 4% T opical Solution. No specimens were taken. A time out was conducted at 11:05, prior to the start of the procedure. A Minimum amount of bleeding was controlled with Pressure. The procedure was tolerated well with a pain level of 0 throughout and a pain level of 0 following the procedure. Post Debridement Measurements: 1cm length x 0.5cm width x 0.1cm depth; 0.039cm^3 volume. Character of Wound/Ulcer Post Debridement is improved. Severity of Tissue Post Debridement is: Fat layer exposed. Post procedure Diagnosis Wound #10: Same as Pre-Procedure General Notes: scribed by Megan Gouty, RN for Dr.  Celine [name]. Pre-procedure diagnosis of Wound #10 is a Diabetic Wound/Ulcer of the Lower Extremity located on the Left,Medial Lower Leg . There was a Four Layer Compression Therapy Procedure by Megan Gouty, RN. Post procedure Diagnosis Wound #10: Same as Pre-Procedure Wound #9 Pre-procedure diagnosis of Wound #9 is an Abrasion located on the Left Groin . There was a Selective/Open Wound Non-Viable Tissue Debridement with a total area of 0.3 sq cm performed by Megan Maudlin, MD. With the following instrument(s): Curette to remove Non-Viable tissue/material. Material removed includes [facility] and Other: old dressing after achieving pain control using Lidocaine 4% Topical Solution. No specimens were taken. A time out was conducted at 11:05, prior to the start of the procedure. A Minimum amount of bleeding was controlled with Pressure. The procedure was tolerated well with a pain level of 0 throughout and a pain level of 0 following the procedure. Post Debridement Measurements: 0.3cm length x 1cm width x  0.1cm depth; 0.024cm^3 volume. Character of Wound/Ulcer Post Debridement is improved. Post procedure Diagnosis Wound #9: Same as Pre-Procedure General Notes: scribed by Megan Gouty, RN for Dr. Celine [name]. Plan Follow-up Appointments: Return Appointment in 2 weeks. - Dr. Celine [name] RM 1 Nurse Visit: - 1 week Monday Anesthetic: Wound #10 Left,Medial Lower Leg: (In clinic) Topical Lidocaine 4% applied to wound bed Wound #9 Left Groin: (In clinic) Topical Lidocaine 4% applied to wound bed Bathing/ Shower/ Hygiene: May shower with protection but do not get wound dressing(s) wet. Edema Control - Lymphedema / SCD / Other: Elevate legs to the level of the heart or above for 30 minutes daily and/or when sitting, a frequency of: - 3-4 times a day throughout the day. Avoid standing for long periods of time. Exercise regularly The following medication(s) was prescribed: lidocaine topical 4 % cream cream  topical for prior to debridement was prescribed at facility WOUND #10: - Lower Leg Wound Laterality: Left, Medial Cleanser: Soap and Water 1 x Per Week/30 Days Discharge Instructions: May shower and wash wound with dial antibacterial soap and water prior to dressing change. Cleanser: Wound Cleanser 1 x Per Week/30 Days Discharge Instructions: Cleanse the wound with wound cleanser prior to applying a clean dressing using gauze sponges, not tissue or cotton balls. Peri-Wound Care: Sween Lotion (Moisturizing lotion) 1 x Per Week/30 Days Discharge Instructions: Apply moisturizing lotion as directed Prim Dressing: Promogran Prisma Matrix, 4.34 (sq in) (silver collagen) 1 x Per Week/30 Days ary Discharge Instructions: Moisten collagen with hydrogel Secondary Dressing: Woven Gauze Sponge, Non-Sterile 4x4 in 1 x Per Week/30 Days Discharge Instructions: Apply over primary dressing as directed. Com pression Wrap: FourPress (4 layer compression wrap) 1 x Per Week/30 Days Discharge Instructions: Apply four layer compression as directed. Apply first layer of unna boot to upper portion of lower leg. WOUND #9: - Groin Wound Laterality: Left Cleanser: Soap and Water 1 x Per Day/30 Days Discharge Instructions: May shower and wash wound with dial antibacterial soap and water prior to dressing change. Prim Dressing: KerraCel Ag Gelling Fiber Dressing, 4x5 in (silver alginate) 1 x Per Day/30 Days ary Discharge Instructions: Apply silver alginate to wound bed as instructed Secondary Dressing: Woven Gauze Sponge, Non-Sterile 4x4 in 1 x Per Day/30 Days Discharge Instructions: Apply over primary dressing as directed. Secured With: 44M Medipore H Soft Cloth Surgical T ape, 4 x 10 (in/yd) 1 x Per Day/30 Days Discharge Instructions: Secure with tape as directed. 03/12/2022: Both wounds are smaller today. The leg wound has epithelialized substantially with just 2 open areas. Edema control is excellent. The groin  wound has also contracted. There is a little slough on the surface. I used a curette to debride slough and eschar from the leg wound and slough from the groin wound. We will continue Prisma silver collagen to the leg, moistened with hydrogel followed by 4 layer compression and silver alginate to the groin wound with a gauze covering. Follow-up in 1 week. Electronic Signature(s) Signed: 03/12/2022 12:07:49 PM By: Megan Maudlin MD FACS Signed: 03/12/2022 5:53:41 PM By: Megan Gouty RN, BSN [place], Revere [name] ([id number]) By: Megan Gouty RN, BSN [id number].pdf Page 10 of 12 Signed: 03/12/2022 5:53:41 PM Previous Signature: 03/12/2022 11:44:08 AM Version By: Megan Maudlin MD FACS Entered By: [name] on 03/12/2022 11:56:27 -------------------------------------------------------------------------------- HxROS Details Patient Name: Date of Service: [name], Daisee [name]. 03/12/2022 10:30 A M Medical Record Number: [id number] Patient Account Number: [id number] Date of Birth/Sex: Treating RN: 05-05-1970 (51 y.o. F) Primary  Care Provider: Karle [name] Other Clinician: Referring Provider: Treating Provider/Extender: [name] in Treatment: 1 Information Obtained From Patient Eyes Medical History: Negative for: Cataracts; Glaucoma; Optic Neuritis Ear/Nose/Mouth/Throat Medical History: Negative for: Chronic sinus problems/congestion; Middle ear problems Hematologic/Lymphatic Medical History: Positive for: Lymphedema Negative for: Anemia; Hemophilia; Human Immunodeficiency Virus; Sickle Cell Disease Respiratory Medical History: Positive for: Sleep Apnea Negative for: Aspiration; Asthma; Chronic Obstructive Pulmonary Disease (COPD); Pneumothorax; Tuberculosis Cardiovascular Medical History: Positive for: Congestive Heart Failure; Coronary Artery Disease; Hypertension Negative for: Angina; Arrhythmia; Deep Vein  Thrombosis; Hypotension; Myocardial Infarction; Peripheral Arterial Disease; Peripheral Venous Disease; Phlebitis; Vasculitis Gastrointestinal Medical History: Negative for: Cirrhosis ; Colitis; Crohns; Hepatitis A; Hepatitis B; Hepatitis C Endocrine Medical History: Positive for: Type II Diabetes Negative for: Type I Diabetes Time with diabetes: 2002 Treated with: Oral agents, Diet Blood sugar tested every day: Yes Tested : TID Genitourinary Medical History: Negative for: End Stage Renal Disease Immunological Medical History: Negative for: Lupus Erythematosus; Raynauds; Scleroderma Integumentary (Skin) VALREE, FEILD [name] ([id number]) [id number] Page 11 of 12 Medical History: Negative for: History of Burn Musculoskeletal Medical History: Negative for: Gout; Rheumatoid Arthritis; Osteoarthritis; Osteomyelitis Neurologic Medical History: Negative for: Dementia; Neuropathy; Quadriplegia; Paraplegia; Seizure Disorder Oncologic Medical History: Negative for: Received Chemotherapy; Received Radiation Psychiatric Medical History: Negative for: Anorexia/bulimia; Confinement Anxiety Immunizations Pneumococcal Vaccine: Received Pneumococcal Vaccination: No Implantable Devices None Family and Social History Cancer: No; Diabetes: Yes - Mother,Maternal Grandparents; Heart Disease: Yes - Paternal Grandparents; Hereditary Spherocytosis: No; Hypertension: Yes - Mother,Maternal Grandparents; Kidney Disease: No; Lung Disease: Yes - Father,Paternal Grandparents; Seizures: No; Stroke: No; Thyroid Problems: No; Tuberculosis: No; Former smoker; Marital Status - Single; Alcohol Use: Never; Drug Use: No History; Caffeine Use: Daily; Financial Concerns: No; Food, Clothing or Shelter Needs: No; Support System Lacking: No; Transportation Concerns: No Electronic Signature(s) Signed: 03/12/2022 12:07:49 PM By: Megan Maudlin MD FACS Entered By: [name] on  03/12/2022 11:42:25 -------------------------------------------------------------------------------- SuperBill Details Patient Name: Date of Service: [name], Courney [name]. 03/12/2022 Medical Record Number: [id number] Patient Account Number: [id number] Date of Birth/Sex: Treating RN: Jan 08, 1971 (51 y.o. F) Primary Care Provider: Karle [name] Other Clinician: Referring Provider: Treating Provider/Extender: [name] in Treatment: 1 Diagnosis Coding ICD-10 Codes Code Description [phone] Non-pressure chronic ulcer of other part of left lower leg with fat layer exposed L97.122 Non-pressure chronic ulcer of left thigh with fat layer exposed I50.30 Unspecified diastolic (congestive) heart failure I87.2 Venous insufficiency (chronic) (peripheral) I89.0 Lymphedema, not elsewhere classified E66.01 Morbid (severe) obesity due to excess calories Facility Procedures : LINDAMARIE, MACLACHLAN Code: [id number] ELISSA [name] ([id number] ICD-10 L9 L9 Description: [phone] - DEBRIDE WOUND 1ST 20 SQ CM OR < 1) [phone] Diagnosis Description 7.822 Non-pressure chronic ulcer of other part of left lower leg with fat layer exposed 7.122 Non-pressure chronic ulcer of left thigh with fat layer exposed Modifier: [id number] Quantity: 1 7.pdf Page 12 of 12 Physician Procedures : CPT4 Code Description Modifier [id number] - WC PHYS LEVEL 3 - EST PT 25 ICD-10 Diagnosis Description L97.822 Non-pressure chronic ulcer of other part of left lower leg with fat layer exposed L97.122 Non-pressure chronic ulcer of left thigh with fat  layer exposed I50.30 Unspecified diastolic (congestive) heart failure I87.2 Venous insufficiency (chronic) (peripheral) Quantity: 1 : [id number] - WC PHYS DEBR WO ANESTH 20 SQ CM ICD-10 Diagnosis Description L97.822 Non-pressure chronic ulcer of other part of left lower leg with fat layer exposed L97.122 Non-pressure chronic ulcer of left thigh with  fat layer  exposed Quantity: 1 Electronic Signature(s) Signed: 03/12/2022 11:44:25 AM By: Megan Maudlin MD FACS Entered By: [name] on 03/12/2022 11:44:25

## 2022-03-18 ENCOUNTER — Encounter (HOSPITAL_BASED_OUTPATIENT_CLINIC_OR_DEPARTMENT_OTHER): Payer: Medicaid Other | Admitting: General Surgery

## 2022-03-18 DIAGNOSIS — L97822 Non-pressure chronic ulcer of other part of left lower leg with fat layer exposed: Secondary | ICD-10-CM | POA: Diagnosis not present

## 2022-03-18 NOTE — Progress Notes (Signed)
[name], [name] ([id number]) [id number] Page 1 of 7 Visit Report for 03/18/2022 Arrival Information Details Patient Name: Date of Service: [name], [name]. 03/18/2022 2:00 PM Medical Record Number: [id number] Patient Account Number: [id number] Date of Birth/Sex: Treating RN: [birth year] (51 y.o. America Brown Primary Care Bryndle Corredor: [name] Other Clinician: Referring Harlo Jaso: Treating Sherl Yzaguirre/Extender: Annice Needy in Treatment: 1 Visit Information History Since Last Visit Added or deleted any medications: No Patient Arrived: Ambulatory Any new allergies or adverse reactions: No Arrival Time: 14:29 Had a fall or experienced change in No Accompanied By: self activities of daily living that may affect Transfer Assistance: None risk of falls: Patient Identification Verified: Yes Signs or symptoms of abuse/neglect since last visito No Patient Requires Transmission-Based Precautions: No Hospitalized since last visit: No Patient Has Alerts: Yes Implantable device outside of the clinic excluding No Patient Alerts: Patient on Blood Thinner cellular tissue based products placed in the center ABI R 0.99 08/10/2021 since last visit: ABI L 1.04 08/10/2021 Has Dressing in Place as Prescribed: Yes Pain Present Now: Yes Electronic Signature(s) Signed: 03/18/2022 5:09:36 PM By: Dellie Catholic RN Entered By: Dellie Catholic on 03/18/2022 14:30:41 -------------------------------------------------------------------------------- Clinic Level of Care Assessment Details Patient Name: Date of Service: [name], [name]. 03/18/2022 2:00 PM Medical Record Number: [id number] Patient Account Number: [id number] Date of Birth/Sex: Treating RN: 12-04-1970 (51 y.o. America Brown Primary Care Datron Brakebill: [name] Other Clinician: Referring Nayvie Lips: Treating Meeka Cartelli/Extender: Annice Needy in  Treatment: 1 Clinic Level of Care Assessment Items TOOL 3 Quantity Score X- 1 0 Use when EandM and Procedure is performed on FOLLOW-UP visit ASSESSMENTS - Nursing Assessment / Reassessment X- 1 10 Reassessment of Co-morbidities (includes updates in patient status) X- 1 5 Reassessment of Adherence to Treatment Plan ASSESSMENTS - Wound and Skin Assessment / Reassessment []  - Points for Wound Assessment can only be taken for a new wound of unknown or different etiology and a procedure is 0 NOT performed to that wound []  - 0 Simple Wound Assessment / Reassessment - one wound []  - 0 Complex Wound Assessment / Reassessment - multiple wounds []  - 0 Dermatologic / Skin Assessment (not related to wound area) ASSESSMENTS - Focused Assessment []  - 0 Circumferential Edema Measurements - multi extremities [name], [name] ([id number]) [id number] Page 2 of 7 []  - 0 Nutritional Assessment / Counseling / Intervention []  - 0 Lower Extremity Assessment (monofilament, tuning fork, pulses) []  - 0 Peripheral Arterial Disease Assessment (using hand held doppler) ASSESSMENTS - Ostomy and/or Continence Assessment and Care []  - 0 Incontinence Assessment and Management []  - 0 Ostomy Care Assessment and Management (repouching, etc.) PROCESS - Coordination of Care []  - Points for Discharge Coordination can only be taken for a new wound of unknown or different etiology and a 0 procedure is NOT performed to that wound X- 1 15 Simple Patient / Family Education for ongoing care []  - 0 Complex (extensive) Patient / Family Education for ongoing care X- 1 10 Staff obtains Programmer, systems, Records, T Results / Process Orders est []  - 0 Staff telephones HHA, Nursing Homes / Clarify orders / etc []  - 0 Routine Transfer to another Facility (non-emergent condition) []  - 0 Routine Hospital Admission (non-emergent condition) []  - 0 New Admissions / Biomedical engineer / Ordering  NPWT Apligraf, etc. , []  - 0 Emergency Hospital Admission (emergent condition) X- 1 10 Simple Discharge Coordination []  - 0 Complex (extensive) Discharge Coordination PROCESS - Special  Needs []  - 0 Pediatric / Minor Patient Management []  - 0 Isolation Patient Management []  - 0 Hearing / Language / Visual special needs []  - 0 Assessment of Community assistance (transportation, [name]/C planning, etc.) []  - 0 Additional assistance / Altered mentation []  - 0 Support Surface(s) Assessment (bed, cushion, seat, etc.) INTERVENTIONS - Wound Cleansing / Measurement []  - Points for Wound Cleaning / Measurement, Wound Dressing, Specimen Collection and Specimen taken to lab can 0 only be taken for a new wound of unknown or different etiology and a procedure is NOT performed to that wound []  - 0 Simple Wound Cleansing - one wound []  - 0 Complex Wound Cleansing - multiple wounds []  - 0 Wound Imaging (photographs - any number of wounds) []  - 0 Wound Tracing (instead of photographs) []  - 0 Simple Wound Measurement - one wound []  - 0 Complex Wound Measurement - multiple wounds INTERVENTIONS - Wound Dressings []  - 0 Small Wound Dressing one or multiple wounds X- 2 15 Medium Wound Dressing one or multiple wounds []  - 0 Large Wound Dressing one or multiple wounds INTERVENTIONS - Miscellaneous []  - 0 External ear exam []  - 0 Specimen Collection (cultures, biopsies, blood, body fluids, etc.) []  - 0 Specimen(s) / Culture(s) sent or taken to Lab for analysis []  - 0 Patient Transfer (multiple staff / Harrel Lemon Lift / Similar devices) []  - 0 Simple Staple / Suture removal (25 or less) [name], [name] ([id number]) [id number] Page 3 of 7 []  - 0 Complex Staple / Suture removal (26 or more) []  - 0 Hypo / Hyperglycemic Management (close monitor of Blood Glucose) []  - 0 Ankle / Brachial Index (ABI) - do not check if billed separately X- 1 5 Vital Signs Has the  patient been seen at the hospital within the last three years: Yes Total Score: 85 Level Of Care: New/Established - Level 3 Electronic Signature(s) Signed: 03/18/2022 5:09:36 PM By: Dellie Catholic RN Entered By: Dellie Catholic on 03/18/2022 17:08:24 -------------------------------------------------------------------------------- Compression Therapy Details Patient Name: Date of Service: [name], [name]. 03/18/2022 2:00 PM Medical Record Number: [id number] Patient Account Number: [id number] Date of Birth/Sex: Treating RN: 30-Oct-1970 (51 y.o. America Brown Primary Care Yoshimi Sarr: [name] Other Clinician: Referring Winston Sobczyk: Treating Issiac Jamar/Extender: Annice Needy in Treatment: 1 Compression Therapy Performed for Wound Assessment: Wound #10 Left,Medial Lower Leg Performed By: Clinician Dellie Catholic, RN Compression Type: Four Layer Electronic Signature(s) Signed: 03/18/2022 5:09:36 PM By: Dellie Catholic RN Entered By: Dellie Catholic on 03/18/2022 14:32:44 -------------------------------------------------------------------------------- Encounter Discharge Information Details Patient Name: Date of Service: [name], [name]. 03/18/2022 2:00 PM Medical Record Number: [id number] Patient Account Number: [id number] Date of Birth/Sex: Treating RN: 1970/06/27 (51 y.o. America Brown Primary Care Aston Lieske: [name] Other Clinician: Referring Nevayah Faust: Treating Maeghan Canny/Extender: Annice Needy in Treatment: 1 Encounter Discharge Information Items Discharge Condition: Stable Ambulatory Status: Ambulatory Discharge Destination: Home Transportation: Private Auto Accompanied By: self Schedule Follow-up Appointment: Yes Clinical Summary of Care: Patient Declined Electronic Signature(s) Signed: 03/18/2022 5:09:36 PM By: Dellie Catholic RN Entered By: Dellie Catholic on 03/18/2022 14:33:35 [name] [name] ([id number]) [id number] Page 4 of 7 -------------------------------------------------------------------------------- Patient/Caregiver Education Details Patient Name: Date of Service: [name], [name] 11/20/2023andnbsp2:00 PM Medical Record Number: [id number] Patient Account Number: [id number] Date of Birth/Gender: Treating RN: 09/20/1970 (51 y.o. America Brown Primary Care Physician: [name] Other Clinician: Referring Physician: Treating Physician/Extender: Annice Needy in Treatment: 1 Education  Assessment Education Provided To: Patient Education Topics Provided Wound/Skin Impairment: Methods: Explain/Verbal Responses: Return demonstration correctly Electronic Signature(s) Signed: 03/18/2022 5:09:36 PM By: Dellie Catholic RN Entered By: Dellie Catholic on 03/18/2022 14:33:14 -------------------------------------------------------------------------------- Wound Assessment Details Patient Name: Date of Service: [name], [name]. 03/18/2022 2:00 PM Medical Record Number: [id number] Patient Account Number: [id number] Date of Birth/Sex: Treating RN: 03-26-1971 (51 y.o. America Brown Primary Care Mena Simonis: [name] Other Clinician: Referring Chaselynn Kepple: Treating Shellie Rogoff/Extender: Annice Needy in Treatment: 1 Wound Status Wound Number: 10 Primary Diabetic Wound/Ulcer of the Lower Extremity Etiology: Wound Location: Left, Medial Lower Leg Secondary Lymphedema Wounding Event: Gradually Appeared Etiology: Date Acquired: 02/09/2022 Wound Open Weeks Of Treatment: 1 Status: Clustered Wound: Yes Comorbid Lymphedema, Sleep Apnea, Congestive Heart Failure, Coronary History: Artery Disease, Hypertension, Type II Diabetes Wound Measurements Length: (cm) Width: (cm) Depth: (cm) Clustered Quantity: Area: (cm) Volume: (cm) 1 % Reduction in Area: 91.6% 0.5 %  Reduction in Volume: 91.6% 0.1 Epithelialization: Large (67-100%) 2 Tunneling: No 0.393 Undermining: No 0.039 Wound Description Classification: Grade 1 Wound Margin: Distinct, outline attached Exudate Amount: Small Exudate Type: Serosanguineous Exudate Color: red, brown Foul Odor After Cleansing: No Slough/Fibrino Yes Wound Bed DANYELL, SHADER [name] ([id number]) [phone].pdf Page 5 of 7 Granulation Amount: Large (67-100%) Exposed Structure Granulation Quality: Red, Pink Fascia Exposed: No Necrotic Amount: Small (1-33%) Fat Layer (Subcutaneous Tissue) Exposed: Yes Necrotic Quality: Eschar, Adherent Slough Tendon Exposed: No Muscle Exposed: No Joint Exposed: No Bone Exposed: No Periwound Skin Texture Texture Color No Abnormalities Noted: No No Abnormalities Noted: No Callus: No Atrophie Blanche: No Crepitus: No Cyanosis: No Excoriation: No Ecchymosis: No Induration: No Erythema: No Rash: No Hemosiderin Staining: Yes Scarring: Yes Mottled: No Pallor: No Moisture Rubor: No No Abnormalities Noted: No Dry / Scaly: No Temperature / Pain Maceration: No Temperature: No Abnormality Treatment Notes Wound #10 (Lower Leg) Wound Laterality: Left, Medial Cleanser Soap and Water Discharge Instruction: May shower and wash wound with dial antibacterial soap and water prior to dressing change. Wound Cleanser Discharge Instruction: Cleanse the wound with wound cleanser prior to applying a clean dressing using gauze sponges, not tissue or cotton balls. Peri-Wound Care Sween Lotion (Moisturizing lotion) Discharge Instruction: Apply moisturizing lotion as directed Topical Primary Dressing Promogran Prisma Matrix, 4.34 (sq in) (silver collagen) Discharge Instruction: Moisten collagen with hydrogel Secondary Dressing Woven Gauze Sponge, Non-Sterile 4x4 in Discharge Instruction: Apply over primary dressing as directed. Secured With Compression  Wrap FourPress (4 layer compression wrap) Discharge Instruction: Apply four layer compression as directed. Apply first layer of unna boot to upper portion of lower leg. Compression Stockings Add-Ons Electronic Signature(s) Signed: 03/18/2022 5:09:36 PM By: Dellie Catholic RN Entered By: Dellie Catholic on 03/18/2022 14:31:45 -------------------------------------------------------------------------------- Wound Assessment Details Patient Name: Date of Service: [name], [name]. 03/18/2022 2:00 PM Medical Record Number: [id number] Patient Account Number: [id number] Date of Birth/Sex: Treating RN: [birth year] (51 y.o. America Brown Primary Care Yannick Steuber: [name] Other Clinician: CHELCEA, ZAHN [name] ([id number]) [id number] Page 6 of 7 Referring Kason Benak: Treating Miata Culbreth/Extender: Annice Needy in Treatment: 1 Wound Status Wound Number: 9 Primary Abrasion Etiology: Wound Location: Left Groin Wound Open Wounding Event: Not Known Status: Date Acquired: 03/02/2022 Comorbid Lymphedema, Sleep Apnea, Congestive Heart Failure, Coronary Weeks Of Treatment: 1 History: Artery Disease, Hypertension, Type II Diabetes Clustered Wound: No Wound Measurements Length: (cm) 0.3 Width: (cm) 1 Depth: (cm) 0.1 Area: (cm) 0.236 Volume: (cm) 0.024 % Reduction in Area: 79.1% %  Reduction in Volume: 78.8% Epithelialization: Medium (34-66%) Tunneling: No Undermining: No Wound Description Classification: Full Thickness Without Exposed Suppor Wound Margin: Distinct, outline attached Exudate Amount: Small Exudate Type: Serosanguineous Exudate Color: red, brown t Structures Foul Odor After Cleansing: No Slough/Fibrino Yes Wound Bed Granulation Amount: Large (67-100%) Exposed Structure Granulation Quality: Red, Pink Fascia Exposed: No Necrotic Amount: Small (1-33%) Fat Layer (Subcutaneous Tissue) Exposed: Yes Necrotic Quality:  Adherent Slough Tendon Exposed: No Muscle Exposed: No Joint Exposed: No Bone Exposed: No Periwound Skin Texture Texture Color No Abnormalities Noted: No No Abnormalities Noted: No Callus: No Atrophie Blanche: No Crepitus: No Cyanosis: No Excoriation: No Ecchymosis: No Induration: No Erythema: No Rash: No Hemosiderin Staining: No Scarring: Yes Mottled: No Pallor: No Moisture Rubor: No No Abnormalities Noted: No Dry / Scaly: No Maceration: No Treatment Notes Wound #9 (Groin) Wound Laterality: Left Cleanser Soap and Water Discharge Instruction: May shower and wash wound with dial antibacterial soap and water prior to dressing change. Peri-Wound Care Topical Primary Dressing KerraCel Ag Gelling Fiber Dressing, 4x5 in (silver alginate) Discharge Instruction: Apply silver alginate to wound bed as instructed Secondary Dressing Woven Gauze Sponge, Non-Sterile 4x4 in Discharge Instruction: Apply over primary dressing as directed. Secured With 39M Medipore H Soft Cloth Surgical T ape, 4 x 10 (in/yd) Discharge Instruction: Secure with tape as directed. Compression TRANG, BOUSE [name] ([id number]) [id number] Page 7 of 7 Compression Stockings Add-Ons Electronic Signature(s) Signed: 03/18/2022 5:09:36 PM By: Dellie Catholic RN Entered By: Dellie Catholic on 03/18/2022 14:32:09 -------------------------------------------------------------------------------- Vitals Details Patient Name: Date of Service: [name], Thresea [name]. 03/18/2022 2:00 PM Medical Record Number: [id number] Patient Account Number: [id number] Date of Birth/Sex: Treating RN: 1971-02-03 (51 y.o. America Brown Primary Care Aariyah Sampey: [name] Other Clinician: Referring Cordon Gassett: Treating Winfred Redel/Extender: Annice Needy in Treatment: 1 Vital Signs Time Taken: 14:31 Temperature (F): 97.7 Height (in): 69 Pulse (bpm): 83 Weight (lbs):  300 Respiratory Rate (breaths/min): 16 Body Mass Index (BMI): 44.3 Blood Pressure (mmHg): 156/99 Reference Range: 80 - 120 mg / dl Electronic Signature(s) Signed: 03/18/2022 5:09:36 PM By: Dellie Catholic RN Entered By: Dellie Catholic on 03/18/2022 14:31:08

## 2022-03-19 NOTE — Progress Notes (Signed)
MARGEE, TRENTHAM D ([id number]) [id number] Page 1 of 1 Visit Report for 03/18/2022 SuperBill Details Patient Name: Date of Service: [name], [name] 03/18/2022 Medical Record Number: [id number] Patient Account Number: [id number] Date of Birth/Sex: Treating RN: 1970/09/01 (51 y.o. 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 I50.30 Unspecified diastolic (congestive) heart failure I87.2 Venous insufficiency (chronic) (peripheral) I89.0 Lymphedema, not elsewhere classified E66.01 Morbid (severe) obesity due to excess calories Facility Procedures CPT4 Code Description Modifier Quantity 98102548 [id number] - WOUND CARE VISIT-LEV 3 EST PT 1 [id number] (Facility Use Only) 29581LT - APPLY MULTLAY COMPRS LWR LT LEG 1 Electronic Signature(s) Signed: 03/18/2022 5:09:36 PM By: Dellie Catholic RN Signed: 03/19/2022 7:34:12 AM By: Fredirick Maudlin MD FACS Previous Signature: 03/18/2022 4:40:34 PM Version By: Fredirick Maudlin MD FACS Entered By: Dellie Catholic on 03/18/2022 17:08:42

## 2022-03-19 NOTE — Progress Notes (Unsigned)
Synopsis: Referred for asthma by Ladell Pier, MD  Subjective:   PATIENT ID: [name] GENDER: female DOB: 04-Apr-1971, MRN: [id number]  No chief complaint on file.   51yF with history of recurrent DVT, IDA with port for iron infusions, obesity, diastolic dysfunction, OSA last PSG done years ago in [place] in 2018 and was on CPAP referred for asthma followeing recent hospitalization for acute on chronic diastolic HF exacerbation, possible asthma exacerbation, discharged 8/1.   She says that prior to last hospitalization she had gone to cancer center to get her portacath flushed. She rapidly developed worsening cough when she got home. What she understands of hospitalization is that she had a whole lot of fluid on her heart which they relieved with lasix. She is unsure what made biggest difference with her recovery - whether it was treatment of CHF or her inhalers. She is unsure what her dry weight is. She wonders if stress is a trigger for her asthma. She does not have much of a cough currently. DOE to 10 minutes which has very gradually worsened over last couple of years.   Regarding her OSA her DME supplier had closed up shop by the time she tried to call with issues she was having with her machine. She does have a whole lot of daytime sleepiness. She does not snore. She does not have PND.   Father had emphysema, uncle with stomach cancer, grandmother died of breast cancer  She has 27 pack year smoking history quit in 2005  She is disabled, in past worked at [employer], Art therapist. SHe has two dogs at home, no pet bird.   Interval HPI:  PFT with moderately severe obstruction and reduced FVC, neg BD response  PSG with moderate OSA  Admission for acute on chronic diastolic heart failure in 05/2021  Otherwise pertinent review of systems is negative.    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   Morbid obesity due to excess calories (HCC)    OSA (obstructive sleep apnea) 07/11/2021   Sleep study April 06, 2019 (Dr. Halford Chessman): Moderate OSA (AHI of 24.4 and SPO2 low of 77%).  Majority events during REM sleep.-recommend CPAP, oral appliance or surgical. Reviewed sleep hygiene.  Avoid sedatives.     Osteoarthritis of left knee    Prediabetes    Small bowel obstruction ([place])    as a child   Speech impediment    Stutter / stammer     Family History  Problem Relation Age of Onset   Diabetes Mellitus II Mother    COPD Father    Diabetes Father  Diabetes Mellitus II Maternal Grandmother    Breast cancer Paternal Grandfather      Past Surgical History:  Procedure Laterality Date   ABDOMINAL WALL DEFECT REPAIR  1970   IR CV LINE INJECTION  10/24/2020   IR REMOVAL TUN ACCESS W/ PORT W/O FL MOD SED  05/28/2021   IVC FILTER INSERTION  2017   Lower Extremity Venous Duplex  06/23/2020   No evidence of DVT or superficial thrombosis bilaterally.  No evidence of deep venous insufficiency bilaterally.  No evidence of SSV reflux.  Right GSV in the calf has reflux, no reflux in L GSV.;  Repeated in July 2020-no DVT    [place]  2014   TRANSTHORACIC ECHOCARDIOGRAM  05/18/2021   EF 60 to 65%.  No RWMA.  Mild concentric LVH.  "Normal diastolic parameters ".  Normal longitudinal strain.  Normal PAP, RAP.  Normal aortic and mitral valves.==> In July 2022, echo read as GR 1 DD otherwise stable.    Social History   Socioeconomic History   Marital status: Single    Spouse name: Not on file   Number of children: 0   Years of education: Not on file   Highest education level: 12th grade  Occupational History   Occupation: unemployed on disablity  Tobacco Use   Smoking status: Former   Smokeless tobacco: Never  Scientific laboratory technician Use: Never used  Substance and Sexual Activity   Alcohol use: Not Currently   Drug use: Not Currently   Sexual activity: Not Currently  Other Topics Concern   Not on file  Social History Narrative   Not on file   Social Determinants of Health   Financial Resource Strain: Low Risk  (09/06/2021)   Overall Financial Resource Strain (CARDIA)    Difficulty of Paying Living Expenses: Not very hard  Food Insecurity: No Food Insecurity (03/10/2022)   Hunger Vital Sign    Worried About Running Out of Food in the Last Year: Never true    Ran Out of Food in the Last Year: Never true  Transportation Needs: No Transportation Needs (03/10/2022)   PRAPARE - Hydrologist (Medical): No    Lack of Transportation (Non-Medical): No  Physical Activity: Sufficiently Active (12/14/2021)   Exercise Vital Sign    Days of Exercise per Week: 7 days    Minutes of Exercise per Session: 30 min  Stress: Stress Concern Present (02/27/2022)   [place]    Feeling of Stress : To some extent  Social Connections: Socially Isolated (09/06/2021)   Social Connection and Isolation Panel [NHANES]    Frequency of Communication with Friends and Family: More  than three times a week    Frequency of Social Gatherings with Friends and Family: More than three times a week    Attends Religious Services: Never    Marine scientist or Organizations: No    Attends Archivist Meetings: Never    Marital Status: Never married  Intimate Partner Violence: Not At Risk (03/10/2022)   Humiliation, Afraid, Rape, and Kick questionnaire    Fear of Current or Ex-Partner: No    Emotionally Abused: No    Physically Abused: No    Sexually Abused: No     Allergies  Allergen Reactions   Ace Inhibitors Rash and Other (See Comments)    Make pt bleed  Aspirin Other (See Comments)    Per patient paperwork: blood clot?  Likely because of chronic DOAC   Hydromorphone Hives and Itching   Vancomycin Itching and Rash   Contrast Media [Iodinated Contrast Media] Hives   Dilaudid [Hydromorphone Hcl] Hives   Lidocaine Itching    Itching with Lidocaine patch reported 06/13/2021 vis telephone message.     Outpatient Medications Prior to Visit  Medication Sig Dispense Refill   Accu-Chek Softclix Lancets lancets Use as instructed (Patient taking differently: 1 each by Other route See admin instructions. Use as instructed) 100 each 12   acetaminophen (TYLENOL) 500 MG tablet Take 1 tablet (500 mg total) by mouth every 6 (six) hours as needed. (Patient taking differently: Take 500 mg by mouth every 6 (six) hours as needed for mild pain.) 60 tablet 0   acetaminophen (TYLENOL) 650 MG CR tablet Take 650 mg by mouth every 8 (eight) hours as needed for pain.     albuterol (PROAIR HFA) 108 (90 Base) MCG/ACT inhaler INHALE TWO PUFFS BY MOUTH EVERY 6 HOURS AS NEEDED FOR WHEEZING OR SHORTNESS OF BREATH 8.5 g 0   amLODipine (NORVASC) 5 MG tablet TAKE 1/2 TABLET BY MOUTH DAILY 45 tablet 1   apixaban (ELIQUIS) 5 MG TABS tablet Take 1 tablet (5 mg total) by mouth 2 (two) times daily. 180 tablet 1   atorvastatin (LIPITOR) 40 MG tablet Take 1 tablet (40 mg total) by mouth daily.  90 tablet 1   Blood Glucose Calibration (ACCU-CHEK GUIDE CONTROL) LIQD 1 each by In Vitro route daily. 1 each 4   Blood Glucose Monitoring Suppl (ACCU-CHEK GUIDE) w/Device KIT 1 each by Does not apply route 2 (two) times daily. 1 kit 0   busPIRone (BUSPAR) 7.5 MG tablet Take 1 tablet by mouth 2 (two) times daily.     Carboxymethylcellulose Sodium (EYE DROPS OP) Place 1 drop into both eyes daily as needed (dry eyes).     carvedilol (COREG) 25 MG tablet Take 1 tablet (25 mg total) by mouth 2 (two) times daily with a meal. 60 tablet 3   cholecalciferol (VITAMIN D3) 25 MCG (1000 UNIT) tablet Take 1 tablet (1,000 Units total) by mouth daily. 30 tablet 5   FARXIGA 10 MG TABS tablet TAKE 1 TABLET BY MOUTH DAILY 90 tablet 0   FEROSUL 325 (65 Fe) MG tablet TAKE ONE TABLET BY MOUTH DAILY WITH BREAKFAST 100 tablet 0   furosemide (LASIX) 80 MG tablet Take 1 tablet (80 mg total) by mouth daily. Take additional dose of lasix for weight gain, leg swelling 30 tablet 4   gabapentin (NEURONTIN) 100 MG capsule Take 1 capsule (100 mg total) by mouth 3 (three) times daily. 90 capsule 3   glucose blood test strip Use as instructed (Patient taking differently: 1 each by Other route See admin instructions. Use as instructed) 100 each 12   hydrocortisone cream 1 % Apply 1 application topically daily as needed for itching.     hydrOXYzine (VISTARIL) 25 MG capsule Take 1 capsule (25 mg total) by mouth at bedtime as needed. (Patient taking differently: Take 25 mg by mouth at bedtime as needed for anxiety or itching.) 30 capsule 1   methocarbamol (ROBAXIN) 500 MG tablet Take 1 tablet (500 mg total) by mouth 2 (two) times daily as needed for muscle spasms. 60 tablet 1   methylPREDNISolone (MEDROL DOSEPAK) 4 MG TBPK tablet Medrol Dosepak take as instructed 21 tablet 0   mometasone-formoterol (DULERA) 200-5 MCG/ACT AERO INHALE 2  PUFFS BY MOUTH EVERY MORNING AND INHALE TWO PUFFS BY MOUTH EVERY NIGHT AT BEDTIME (Patient taking  differently: Inhale 2 puffs into the lungs 2 (two) times daily.) 13 g 3   neomycin-bacitracin-polymyxin [phone] OINT Apply 1 Application topically 2 (two) times daily. 15 g 0   nitroGLYCERIN (NITROSTAT) 0.4 MG SL tablet Place 1 tablet (0.4 mg total) under the tongue every 5 (five) minutes as needed for chest pain. 30 tablet 0   PARoxetine (PAXIL) 20 MG tablet Take 1 tablet (20 mg total) by mouth daily. 30 tablet 3   potassium chloride SA (KLOR-CON M20) 20 MEQ tablet Take 1 tablet (20 mEq total) by mouth daily. 30 tablet 4   Semaglutide, 1 MG/DOSE, 4 MG/3ML SOPN Inject 1 mg as directed once a week. 3 mL 2   spironolactone (ALDACTONE) 25 MG tablet Take 1 tablet (25 mg total) by mouth daily. 30 tablet 3   vitamin B-12 (CYANOCOBALAMIN) 1000 MCG tablet Take 1,000 mcg by mouth daily.     No facility-administered medications prior to visit.       Objective:   Physical Exam:  General appearance: 51 y.o., female, NAD, conversant  Eyes: anicteric sclerae, moist conjunctivae; no lid-lag; PERRL, tracking appropriately HENT: NCAT; oropharynx, MMM, no mucosal ulcerations; normal hard and soft palate.?Nasal polyp L nare. Neck: Trachea midline; no lymphadenopathy, no JVD Lungs: Faint crackles in bases, no wheeze, with normal respiratory effort CV: RRR, no MRGs  Abdomen: Soft, non-tender, obese; non-distended, BS present  Extremities: 1+ BLE edema, warm Skin: Normal temperature, turgor and texture; no rash Psych: Appropriate affect Neuro: Alert and oriented to person and place, no focal deficit    There were no vitals filed for this visit.    on RA BMI Readings from Last 3 Encounters:  03/09/22 57.42 kg/m  02/07/22 59.15 kg/m  01/29/22 59.15 kg/m   Wt Readings from Last 3 Encounters:  03/09/22 (!) 313 lb 15 oz (142.4 kg)  02/07/22 (!) 323 lb 6.6 oz (146.7 kg)  01/29/22 (!) 323 lb 6.4 oz (146.7 kg)     CBC    Component Value Date/Time   WBC 3.4 (L) 03/07/2022 0329   RBC 3.93  03/07/2022 0329   HGB 10.8 (L) 03/07/2022 0329   HGB 12.3 01/31/2022 1341   HCT 37.0 03/07/2022 0329   HCT 38.8 01/31/2022 1341   PLT 136 (L) 03/07/2022 0329   PLT 109 (L) 01/31/2022 1341   MCV 94.1 03/07/2022 0329   MCV 87 01/31/2022 1341   MCH 27.5 03/07/2022 0329   MCHC 29.2 (L) 03/07/2022 0329   RDW 14.2 03/07/2022 0329   RDW 12.3 01/31/2022 1341   LYMPHSABS 0.9 09/20/2021 1144   LYMPHSABS 0.5 (L) 11/29/2020 1445   MONOABS 0.5 09/20/2021 1144   EOSABS 0.1 09/20/2021 1144   EOSABS 0.0 11/29/2020 1445   BASOSABS 0.0 09/20/2021 1144   BASOSABS 0.0 11/29/2020 1445      Chest Imaging: 11/23/20 CTA Chest reviewed by me with no PE. Prominent azygos which might be related to permcath vs PH. Positive egg and banana sign which could indicate PH. Large heart.  CXR with possible small RLL nodule  Pulmonary Functions Testing Results:    Latest Ref Rng & Units 01/08/2021   11:29 AM  PFT Results  FVC-Pre L 1.66   FVC-Predicted Pre % 60   FVC-Post L 1.62   FVC-Predicted Post % 59   Pre FEV1/FVC % % 66   Post FEV1/FCV % % 72   FEV1-Pre L  1.09   FEV1-Predicted Pre % 49   FEV1-Post L 1.16    Moderately severe obstruction, reduced fvc, neg BD response   PSG 04/05/21: - Moderate obstructive sleep apnea with an AHI of 24.4 and SpO2 low of 77%.  The majority of events occurred in REM sleep with a REM AHI of 54. - Supplemental oxygen was not used during the study.  Echocardiogram:  TTE1/28/23:  1. Left ventricular ejection fraction, by estimation, is 60 to 65%. The  left ventricle has normal function. The left ventricle has no regional  wall motion abnormalities. There is mild concentric left ventricular  hypertrophy. Left ventricular diastolic  parameters were normal. The average left ventricular global longitudinal  strain is -19.3 %. The global longitudinal strain is normal.   2. Right ventricular systolic function is normal. The right ventricular  size is normal. There is  normal pulmonary artery systolic pressure.   3. The mitral valve is grossly normal. No evidence of mitral valve  regurgitation.   4. The aortic valve is grossly normal. Aortic valve regurgitation is not  visualized. No aortic stenosis is present.  Heart Catheterization: None    Assessment & Plan:   # DOE: likely multifactorial with deconditioning, asthma, volume overload/CHF playing roles.   # Uncontrolled persistent asthma:   # OSA: high probability co-morbid OHS. Will likely need in-lab testing given anticipated severity, co-morbid CHF. Was previously on CPAP via oronasal mask that she tolerated well - last tested  Plan: - spiro pre/post BD, instructed to hold dulera for 1-2 days before spirometry unless she has deterioration while holding dulera in which case she can resume - continue dulera 200 2 puffs twice daily, rinse mouth afterward - albuterol prn - vbg - likely plan to order in lab split night PSG with PAP titration +/- TCO2 depending on vbg result      Maryjane Hurter, MD [facility] 03/19/2022 11:25 AM

## 2022-03-20 ENCOUNTER — Ambulatory Visit (INDEPENDENT_AMBULATORY_CARE_PROVIDER_SITE_OTHER): Payer: Medicaid Other | Admitting: Student

## 2022-03-20 ENCOUNTER — Encounter: Payer: Self-pay | Admitting: Student

## 2022-03-20 ENCOUNTER — Telehealth (HOSPITAL_COMMUNITY): Payer: Self-pay

## 2022-03-20 ENCOUNTER — Telehealth: Payer: Self-pay | Admitting: Student

## 2022-03-20 VITALS — BP 126/84 | HR 82 | Temp 98.0°F | Ht 62.0 in | Wt 294.8 lb

## 2022-03-20 DIAGNOSIS — G4733 Obstructive sleep apnea (adult) (pediatric): Secondary | ICD-10-CM

## 2022-03-20 DIAGNOSIS — J4489 Other specified chronic obstructive pulmonary disease: Secondary | ICD-10-CM

## 2022-03-20 NOTE — Telephone Encounter (Signed)
What would breztri, trelegy cost her?  Thanks!

## 2022-03-20 NOTE — Telephone Encounter (Signed)
Called patient to see if she is interested in the Pulmonary Rehab Program. Patient expressed interest. Explained scheduling process, patient verbalized understanding.  ?

## 2022-03-20 NOTE — Patient Instructions (Addendum)
-   Keep using dulera 2 puffs twice daily, rinse mouth out afterward - albuterol as needed - I'll price out other options which may improve asthma control - we'll try to order CPAP  - referral placed to pulmonary rehab - see you in 3 months or sooner if need be!

## 2022-03-22 ENCOUNTER — Encounter: Payer: Self-pay | Admitting: Hematology

## 2022-03-22 ENCOUNTER — Other Ambulatory Visit (HOSPITAL_COMMUNITY): Payer: Self-pay

## 2022-03-25 ENCOUNTER — Other Ambulatory Visit (HOSPITAL_COMMUNITY): Payer: Self-pay

## 2022-03-25 NOTE — Telephone Encounter (Signed)
Neither Breztri or Trelegy is covered. Other options to complete triple therapy are ICS/LABA- Brand Advair Diskus, Advair HFA, Brand Symbicort, Dulera  along with  LAMA- Incruse, Brand Spiriva Handihaler, Spiriva Respimat

## 2022-03-26 ENCOUNTER — Encounter (HOSPITAL_BASED_OUTPATIENT_CLINIC_OR_DEPARTMENT_OTHER): Payer: Medicaid Other | Admitting: General Surgery

## 2022-03-26 DIAGNOSIS — S30811A Abrasion of abdominal wall, initial encounter: Secondary | ICD-10-CM | POA: Diagnosis not present

## 2022-03-26 DIAGNOSIS — L97822 Non-pressure chronic ulcer of other part of left lower leg with fat layer exposed: Secondary | ICD-10-CM | POA: Diagnosis not present

## 2022-03-26 NOTE — Progress Notes (Signed)
[name], [name] [name] ([id number]) [id number] Page 1 of 10 Visit Report for 03/26/2022 Chief Complaint Document Details Patient Name: Date of Service: [name], [name] 03/26/2022 2:45 PM Medical Record Number: [id number] Patient Account Number: [id number] Date of Birth/Sex: Treating RN: 1970/12/30 (51 y.o. F) Primary Care Provider: [name] Other Clinician: Referring Provider: Treating Provider/Extender: Annice Needy in Treatment: 3 Information Obtained from: Patient Chief Complaint 07/23/2021; Left lower extremity wound 03/05/2022: Returns with a wound in the same location on her left lower extremity, as well as 1 in her left groin. Electronic Signature(s) Signed: 03/26/2022 3:32:08 PM By: Fredirick Maudlin MD FACS Entered By: Fredirick Maudlin on 03/26/2022 15:32:08 -------------------------------------------------------------------------------- Debridement Details Patient Name: Date of Service: [name], [name]. 03/26/2022 2:45 PM Medical Record Number: [id number] Patient Account Number: [id number] Date of Birth/Sex: Treating RN: Aug 20, 1970 (51 y.o. [name] Primary Care Provider: [name] Other Clinician: Referring Provider: Treating Provider/Extender: Annice Needy in Treatment: 3 Debridement Performed for Assessment: Wound #9 Left Groin Performed By: Physician Fredirick Maudlin, MD Debridement Type: Debridement Level of Consciousness (Pre-procedure): Awake and Alert Pre-procedure Verification/Time Out Yes - 15:30 Taken: Start Time: 15:30 Pain Control: Lidocaine 4% T opical Solution T Area Debrided (L x W): otal 0.9 (cm) x 2 (cm) = 1.8 (cm) Tissue and other material debrided: Non-Viable, Slough, Biofilm, Slough Level: Non-Viable Tissue Debridement Description: Selective/Open Wound Instrument: Curette Bleeding: Minimum Hemostasis Achieved: Pressure Procedural  Pain: 0 Post Procedural Pain: 0 Response to Treatment: Procedure was tolerated well Level of Consciousness (Post- Awake and Alert procedure): Post Debridement Measurements of Total Wound Length: (cm) 0.9 Width: (cm) 2.7 Depth: (cm) 0.1 Volume: (cm) 0.191 Character of Wound/Ulcer Post Debridement: Improved [name], [name] [name] ([id number]) [id number] Page 2 of 10 Post Procedure Diagnosis Same as Pre-procedure Notes scribed by Baruch Gouty, RN for Dr. Celine Ahr Electronic Signature(s) Signed: 03/26/2022 4:01:47 PM By: Fredirick Maudlin MD FACS Signed: 03/26/2022 5:04:43 PM By: Baruch Gouty RN, BSN Entered By: Baruch Gouty on 03/26/2022 15:31:40 -------------------------------------------------------------------------------- HPI Details Patient Name: Date of Service: [name], [name]. 03/26/2022 2:45 PM Medical Record Number: [id number] Patient Account Number: [id number] Date of Birth/Sex: Treating RN: 1971-03-04 (51 y.o. F) Primary Care Provider: [name] Other Clinician: Referring Provider: Treating Provider/Extender: Annice Needy in Treatment: 3 History of Present Illness HPI Description: Admission 6/24 [name] is a 51 year old female with a past medical history of chronic venous insufficiency, lymphedema, DVT on anticoagulation and diastolic heart failure that presents to the clinic for left lower extremity wound. She was last seen 4 months ago in our clinic for the same issue. The reoccurring wound started at the end of May and has been going on for 1 month. She has been using an ointment and I am unclear what this is. She tries to keep her leg elevated with her compression stocking. She also reports she has lymphedema pumps and has been using them as well. She reports mild pain to the area. She denies signs of infection including increased warmth, erythema or purulent drainage. 7/1; patient  presents for 1 week follow-up. She has tolerated the wrap well. Unfortunately she did have trouble with the wrap Sliding down her leg 2 days ago. She has no issues or complaints today. She denies signs of infection. 7/15; patient presents for follow-up. She has tolerated the wrap well. She has no issues or complaints today. She denies signs of infection. 7/25; patient presents for 1 week  follow-up. She has no issues or complaints today. She denies signs of infection. 8/4; patient presents for follow-up. She has tolerated the compression wrap well to her left lower extremity. She states she was in the hospital last week due to fluid overload. She subsequently developed blisters to her bottom from being in the hospital bed for prolonged periods of time. These have since ruptured. She now has 2 areas of skin breakdown. She has been keeping gauze on them. She denies signs of infection. 8/18; patient presents for 2-week follow-up. She reports improvement to her buttocks wounds. She is able to tolerate the wrap well to her left lower extremity with no issues. She denies signs of infection. 9/1; patient presents for 2-week follow-up. She has no issues or complaints today. she denies signs of infection. 9/15; patient presents for 2-week follow-up. She has no issues or complaints today. She has tolerated the wrap well. She denies signs of infection. 9/27; patient presents for 1 week follow-up. She has no issues or complaints today. 10/20; patient presents for follow-up. She was on vacation for the past 3 weeks. She states she had the last clinic visit wrap in place for 1 week and then she took this off. She states that she has been using her compression wraps since. She states that the wound has gotten larger. She reports pain to the wound site. She denies fever/chills, nausea/vomiting. She denies purulent drainage. 10/24; patient presents for follow-up. She states she tolerated the Kerlix/Coban wrap well. She  reports decrease in pain. She states she started Keflex. She denies systemic signs of infection. 10/28; patient presents for follow-up. She has no issues or complaints today. She denies acute pain. She tolerated the wrap well. 11/4; patient presents for follow-up. She has no issues or complaints. She denies signs of infection. 11/11; patient presents for follow-up. She has no issues or complaints today. She denies signs of infection. She tolerated the wrap well. 11/18; patient presents for follow-up. She states she took a shower however despite a plastic bag the wrap got wet. She currently denies signs of infection. 12/2; patient presents for follow-up. She tolerated the wrap well. She has no issues or complaints today. 12/12; patient presents for follow-up. She has no issues or complaints today. Readmission 07/23/2021 [name] is a 51 year old female with a past medical history of chronic venous insufficiency that presents to the clinic for a left lower extremity wound. She was seen previously in the clinic for the same wound and this was healed on 04/09/2021. Since then she has been wearing her compression stockings intermittently. She reports more swelling to her left lower extremity over the past month and she stopped wearing her compression stocking. Subsequently she developed a wound. Her Lasix has been increased and she has noticed an improvement in her swelling. She currently denies signs of infection. [name], [name] ([id number]) [id number] Page 3 of 10 4/6; patient presents for follow-up. She states that the wrap stayed in place for 2 days before it rolled down and she had to take it off. She has compression stockings however states she could not get these on due to the swelling. She denies signs of infection. 4/13; patient with chronic venous insufficiency that has been in this clinic previously. She has a small pair of open areas on the left lower  leg in the middle of previous scar tissue from I believe a previous wound. Most concerning today is how poor her edema control is above the sclerotic skin in  her lower extremity. This is nonpitting and compatible with lymphedema. She tells me that she was using a juxta lite stocking although it was old we are apparently replacing this. She does not seem aware of how to adjust the tension with this and I think she is going to require 30/40 mmHg equivalent compression She has her arterial studies tomorrow at 2:00 4/25; patient presents for follow-up. ABIs done on 4/14 showed a left ABI of 1.04 and TBI of 0.99. She tolerated the compression wrap well This week. She states she is going to obtain compression stockings today. She has no issues or complaints today. 5/2; patient presents for follow-up. She states her compression stockings are being delivered today. Her wound is closed today. READMISSION 03/05/2022 Despite having worn her juxta lite stockings, the patient reports that the area on her left medial lower leg reopened a couple of weeks ago. She says that she has been applying lotion and leaving it open to air to dry. She also had a new wound open in her left groin. She is not sure how this started, but says it may have been an ingrown hair or pimple. There is slough accumulation in both sites. The leg wound is fairly desiccated. 03/12/2022: After our visit last week, she presented to the emergency department very short of breath. She was thought to be having a CHF exacerbation but was also influenza A positive. She was admitted to [facility] for about a week. She was just discharged yesterday morning. Both wounds are smaller today. The leg wound has epithelialized substantially with just 2 open areas. Edema control is excellent. The groin wound has also contracted. There is a little slough on the surface. 03/26/2022: The wound on her leg is healed. The wound in her groin measured a  little larger today and there is some slough on the surface. Electronic Signature(s) Signed: 03/26/2022 3:32:38 PM By: Fredirick Maudlin MD FACS Entered By: Fredirick Maudlin on 03/26/2022 15:32:37 -------------------------------------------------------------------------------- Physical Exam Details Patient Name: Date of Service: Megan Session [name]. 03/26/2022 2:45 PM Medical Record Number: [id number] Patient Account Number: [id number] Date of Birth/Sex: Treating RN: [birth year] (51 y.o. F) Primary Care Provider: [name] Other Clinician: Referring Provider: Treating Provider/Extender: Annice Needy in Treatment: 3 Constitutional . Slightly tachycardic, asymptomatic.. . . No acute distress. Respiratory Normal work of breathing on room air. Notes 03/26/2022: The wound on her leg is healed. The wound in her groin measured a little larger today and there is some slough on the surface. Electronic Signature(s) Signed: 03/26/2022 3:33:08 PM By: Fredirick Maudlin MD FACS Entered By: Fredirick Maudlin on 03/26/2022 15:33:08 -------------------------------------------------------------------------------- Physician Orders Details Patient Name: Date of Service: Megan Session [name]. 03/26/2022 2:45 PM Medical Record Number: [id number] Patient Account Number: [id number] Date of Birth/Sex: Treating RN: 12-02-1970 (51 y.o. 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 - Dr. Celine Ahr RM 1 Return A Anesthetic Wound #9 Left Groin (In clinic)  Topical Lidocaine 4% applied to wound bed Bathing/ Shower/ Hygiene May shower and wash wound with soap and water. Edema Control - Lymphedema / SCD / Other Elevate legs to the level of the heart or above for 30 minutes daily and/or when sitting, a frequency of: - 3-4 times a day throughout the day. Avoid standing for long periods of time. Exercise regularly Moisturize legs daily. - nightly after removing stockings Compression stocking or Garment 20-30 mm/Hg pressure to: - may be purchased at [facility], [facility] or [facility] Wound #9 - Groin Wound Laterality: Left Cleanser: Soap and Water 1 x Per Day/30 Days Discharge Instructions: May shower and wash wound with dial antibacterial soap and water prior to dressing change. Prim Dressing: KerraCel Ag Gelling Fiber Dressing, 4x5 in (silver alginate) 1 x Per Day/30 Days ary Discharge Instructions: Apply silver alginate to wound bed as instructed Secondary Dressing: Woven Gauze Sponge, Non-Sterile 4x4 in 1 x Per Day/30 Days Discharge Instructions: Apply over primary dressing as directed. Secured With: 12M Medipore H Soft Cloth Surgical T ape, 4 x 10 (in/yd) 1 x Per Day/30 Days Discharge Instructions: Secure with tape as directed. Electronic Signature(s) Signed: 03/26/2022 4:01:47 PM By: Fredirick Maudlin MD FACS Signed: 03/26/2022 5:04:43 PM By: Baruch Gouty RN, BSN Previous Signature: 03/26/2022 3:33:18 PM Version By: Fredirick Maudlin MD FACS Entered By: Baruch Gouty on 03/26/2022 15:35:33 -------------------------------------------------------------------------------- Problem List Details Patient Name: Date of Service: Megan Session [name].  03/26/2022 2:45 PM Medical Record Number: [id number] Patient Account Number: [id number] Date of Birth/Sex: Treating RN: 1970-09-07 (51 y.o. 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 03/05/2022 No Yes I50.30 Unspecified diastolic (congestive) heart failure 03/05/2022 No Yes I87.2 Venous insufficiency (chronic) (peripheral) 03/05/2022 No Yes I89.0 Lymphedema, not elsewhere classified 03/05/2022 No Yes E66.01 Morbid (severe) obesity due to excess calories 03/05/2022 No Yes Inactive Problems Resolved Problems ICD-10 Code Description Active Date Resolved Date L97.822 Non-pressure chronic ulcer of other part of left lower leg with fat layer exposed 03/05/2022 03/05/2022 Electronic Signature(s) Signed: 03/26/2022 3:31:50 PM By: Fredirick Maudlin MD FACS Entered By: Fredirick Maudlin on 03/26/2022 15:31:50 -------------------------------------------------------------------------------- Progress Note Details Patient Name: Date of Service: [name], Aireanna [name]. 03/26/2022 2:45 PM Medical Record Number: [id number] Patient Account Number: [id number] Date of Birth/Sex: Treating RN: 06-07-1970 (51 y.o. F) Primary Care Provider: [name] Other Clinician: Referring Provider: Treating Provider/Extender: Annice Needy in Treatment: 3 Subjective Chief Complaint Information obtained from Patient 07/23/2021; Left lower extremity wound 03/05/2022: Returns with a wound in the same location on her left lower extremity, as well as 1 in her left groin. History of Present Illness  (HPI) Admission 6/24 Ms. Jaquelin Meaney is a 51 year old female with a past medical history of chronic venous insufficiency, lymphedema, DVT on anticoagulation and diastolic heart failure that presents to the clinic for left lower extremity wound. She was last seen 4 months ago in our clinic for the same issue. The reoccurring wound started at the end of May and has been going on for 1 month. She has been using an ointment and I am unclear what this is. She tries to keep her leg elevated with her compression stocking. She  also reports she has lymphedema pumps and has been using them as well. She reports mild pain to the area. She denies signs of infection including increased warmth, erythema or purulent drainage. 7/1; patient presents for 1 week follow-up. She has tolerated the wrap well. Unfortunately she did have trouble with the wrap Sliding down her leg 2 days ago. She has no issues or complaints today. She denies signs of infection. 7/15; patient presents for follow-up. She has tolerated the wrap well. She has no issues or complaints today. She denies signs of infection. 7/25; patient presents for 1 week follow-up. She has no issues or complaints today. She denies signs of infection. 8/4; patient presents for follow-up. She has tolerated the compression wrap well to her left lower extremity. She states she was in the hospital last week due to fluid overload. She subsequently developed blisters to her bottom from being in the hospital bed for prolonged periods of time. These have since ruptured. She now has 2 areas of skin breakdown. She has been keeping gauze on them. She denies signs of infection. [name], [name] ([id number]) [id number] Page 6 of 10 8/18; patient presents for 2-week follow-up. She reports improvement to her buttocks wounds. She is able to tolerate the wrap well to her left lower extremity with no issues. She denies signs of infection. 9/1;  patient presents for 2-week follow-up. She has no issues or complaints today. she denies signs of infection. 9/15; patient presents for 2-week follow-up. She has no issues or complaints today. She has tolerated the wrap well. She denies signs of infection. 9/27; patient presents for 1 week follow-up. She has no issues or complaints today. 10/20; patient presents for follow-up. She was on vacation for the past 3 weeks. She states she had the last clinic visit wrap in place for 1 week and then she took this off. She states that she has been using her compression wraps since. She states that the wound has gotten larger. She reports pain to the wound site. She denies fever/chills, nausea/vomiting. She denies purulent drainage. 10/24; patient presents for follow-up. She states she tolerated the Kerlix/Coban wrap well. She reports decrease in pain. She states she started Keflex. She denies systemic signs of infection. 10/28; patient presents for follow-up. She has no issues or complaints today. She denies acute pain. She tolerated the wrap well. 11/4; patient presents for follow-up. She has no issues or complaints. She denies signs of infection. 11/11; patient presents for follow-up. She has no issues or complaints today. She denies signs of infection. She tolerated the wrap well. 11/18; patient presents for follow-up. She states she took a shower however despite a plastic bag the wrap got wet. She currently denies signs of infection. 12/2; patient presents for follow-up. She tolerated the wrap well. She has no issues or complaints today. 12/12; patient presents for follow-up. She has no issues or complaints today. Readmission 07/23/2021 Ms. Britnee Mcdevitt is a 51 year old female with a past medical history of chronic venous insufficiency that presents to the clinic for a left lower extremity wound. She was seen previously in the clinic for the same wound and this was healed on 04/09/2021. Since then she  has been wearing her compression stockings intermittently. She reports more swelling to her left lower extremity over the past month and she stopped wearing her compression stocking. Subsequently she developed a wound. Her Lasix has been increased and she has noticed an improvement in her swelling. She currently denies signs of infection. 4/6;  patient presents for follow-up. She states that the wrap stayed in place for 2 days before it rolled down and she had to take it off. She has compression stockings however states she could not get these on due to the swelling. She denies signs of infection. 4/13; patient with chronic venous insufficiency that has been in this clinic previously. She has a small pair of open areas on the left lower leg in the middle of previous scar tissue from I believe a previous wound. Most concerning today is how poor her edema control is above the sclerotic skin in her lower extremity. This is nonpitting and compatible with lymphedema. She tells me that she was using a juxta lite stocking although it was old we are apparently replacing this. She does not seem aware of how to adjust the tension with this and I think she is going to require 30/40 mmHg equivalent compression She has her arterial studies tomorrow at 2:00 4/25; patient presents for follow-up. ABIs done on 4/14 showed a left ABI of 1.04 and TBI of 0.99. She tolerated the compression wrap well This week. She states she is going to obtain compression stockings today. She has no issues or complaints today. 5/2; patient presents for follow-up. She states her compression stockings are being delivered today. Her wound is closed today. READMISSION 03/05/2022 Despite having worn her juxta lite stockings, the patient reports that the area on her left medial lower leg reopened a couple of weeks ago. She says that she has been applying lotion and leaving it open to air to dry. She also had a new wound open in her left groin.  She is not sure how this started, but says it may have been an ingrown hair or pimple. There is slough accumulation in both sites. The leg wound is fairly desiccated. 03/12/2022: After our visit last week, she presented to the emergency department very short of breath. She was thought to be having a CHF exacerbation but was also influenza A positive. She was admitted to [facility] for about a week. She was just discharged yesterday morning. Both wounds are smaller today. The leg wound has epithelialized substantially with just 2 open areas. Edema control is excellent. The groin wound has also contracted. There is a little slough on the surface. 03/26/2022: The wound on her leg is healed. The wound in her groin measured a little larger today and there is some slough on the surface. Patient History Information obtained from Patient. Family History Diabetes - Mother,Maternal Grandparents, Heart Disease - Paternal Grandparents, Hypertension - Mother,Maternal Grandparents, Lung Disease - Father,Paternal Grandparents, No family history of Cancer, Hereditary Spherocytosis, Kidney Disease, Seizures, Stroke, Thyroid Problems, Tuberculosis. Social History Former smoker, Marital Status - Single, Alcohol Use - Never, Drug Use - No History, Caffeine Use - Daily. Medical History Eyes Denies history of Cataracts, Glaucoma, Optic Neuritis Ear/Nose/Mouth/Throat Denies history of Chronic sinus problems/congestion, Middle ear problems Hematologic/Lymphatic Patient has history of Lymphedema Denies history of Anemia, Hemophilia, Human Immunodeficiency Virus, Sickle Cell Disease Respiratory Patient has history of Sleep Apnea Denies history of Aspiration, Asthma, Chronic Obstructive Pulmonary Disease (COPD), Pneumothorax, Tuberculosis Cardiovascular Patient has history of Congestive Heart Failure, Coronary Artery Disease, Hypertension Denies history of Angina, Arrhythmia, Deep Vein Thrombosis,  Hypotension, Myocardial Infarction, Peripheral Arterial Disease, Peripheral Venous Disease, Phlebitis, Vasculitis Gastrointestinal [name], [name] ([id number]) [id number] Page 7 of 10 Denies history of Cirrhosis , Colitis, Crohnoos, Hepatitis A, Hepatitis B, Hepatitis C Endocrine Patient has history of Type II  Diabetes Denies history of Type I Diabetes Genitourinary Denies history of End Stage Renal Disease Immunological Denies history of Lupus Erythematosus, Raynaudoos, Scleroderma Integumentary (Skin) Denies history of History of Burn Musculoskeletal Denies history of Gout, Rheumatoid Arthritis, Osteoarthritis, Osteomyelitis Neurologic Denies history of Dementia, Neuropathy, Quadriplegia, Paraplegia, Seizure Disorder Oncologic Denies history of Received Chemotherapy, Received Radiation Psychiatric Denies history of Anorexia/bulimia, Confinement Anxiety Objective Constitutional Slightly tachycardic, asymptomatic.[name] No acute distress. Vitals Time Taken: 2:40 AM, Height: 69 in, Weight: 300 lbs, BMI: 44.3, Temperature: 97.4 F, Pulse: 101 bpm, Respiratory Rate: 20 breaths/min, Blood Pressure: 134/74 mmHg, Capillary Blood Glucose: 125 mg/dl. Respiratory Normal work of breathing on room air. General Notes: 03/26/2022: The wound on her leg is healed. The wound in her groin measured a little larger today and there is some slough on the surface. Integumentary (Hair, Skin) Wound #10 status is Healed - Epithelialized. Original cause of wound was Gradually Appeared. The date acquired was: 02/09/2022. The wound has been in treatment 3 weeks. The wound is located on the Left,Medial Lower Leg. The wound measures 0cm length x 0cm width x 0cm depth; 0cm^2 area and 0cm^3 volume. There is no tunneling or undermining noted. There is a none present amount of drainage noted. There is no granulation within the wound bed. There is no necrotic tissue within the wound bed.  The periwound skin appearance exhibited: Scarring, Hemosiderin Staining. The periwound skin appearance did not exhibit: Callus, Crepitus, Excoriation, Induration, Rash, Dry/Scaly, Maceration, Atrophie Blanche, Cyanosis, Ecchymosis, Mottled, Pallor, Rubor, Erythema. Periwound temperature was noted as No Abnormality. Wound #9 status is Open. Original cause of wound was Not Known. The date acquired was: 03/02/2022. The wound has been in treatment 3 weeks. The wound is located on the Left Groin. The wound measures 0.9cm length x 2cm width x 0.1cm depth; 1.414cm^2 area and 0.141cm^3 volume. There is Fat Layer (Subcutaneous Tissue) exposed. There is a small amount of serosanguineous drainage noted. The wound margin is distinct with the outline attached to the wound base. There is large (67-100%) red, pink granulation within the wound bed. There is no necrotic tissue within the wound bed. The periwound skin appearance did not exhibit: Callus, Crepitus, Excoriation, Induration, Rash, Scarring, Dry/Scaly, Maceration, Atrophie Blanche, Cyanosis, Ecchymosis, Hemosiderin Staining, Mottled, Pallor, Rubor, Erythema. Periwound temperature was noted as No Abnormality. The periwound has tenderness on palpation. Assessment Active Problems ICD-10 Non-pressure chronic ulcer of left thigh with fat layer exposed Unspecified diastolic (congestive) heart failure Venous insufficiency (chronic) (peripheral) Lymphedema, not elsewhere classified Morbid (severe) obesity due to excess calories Procedures Wound #9 Pre-procedure diagnosis of Wound #9 is an Abrasion located on the Left Groin . There was a Selective/Open Wound Non-Viable Tissue Debridement with a total area of 1.8 sq cm performed by Fredirick Maudlin, MD. With the following instrument(s): Curette to remove Non-Viable tissue/material. Material removed includes Slough and Biofilm and after achieving pain control using Lidocaine 4% Topical Solution. No specimens were  taken. A time out was conducted at 15:30, prior to the start of the procedure. A Minimum amount of bleeding was controlled with Pressure. The procedure was tolerated well with a pain level of 0 throughout and a pain level of 0 following the procedure. Post Debridement Measurements: 0.9cm length x 2.7cm width x 0.1cm depth; 0.191cm^3 volume. Character of Wound/Ulcer Post Debridement is improved. Post procedure Diagnosis Wound #9: Same as Pre-Procedure [name], [name] ([id number]) [phone].[id number] Page 8 of 10 General Notes: scribed by Baruch Gouty, RN for Dr. Celine Ahr. Plan 03/26/2022: The wound  on her leg is healed. The wound in her groin measured a little larger today and there is some slough on the surface. I used a curette to debride the slough off of her groin wound. We will continue silver alginate in this location. She was reminded of the importance of wearing compression stockings or other compression garment on her legs as well as moisturizing her skin liberally. Follow-up in 1 week. Electronic Signature(s) Signed: 03/26/2022 3:35:03 PM By: Fredirick Maudlin MD FACS Entered By: Fredirick Maudlin on 03/26/2022 15:35:03 -------------------------------------------------------------------------------- HxROS Details Patient Name: Date of Service: [name], Ronniesha [name]. 03/26/2022 2:45 PM Medical Record Number: [id number] Patient Account Number: [id number] Date of Birth/Sex: Treating RN: 1971-02-12 (51 y.o. F) Primary Care Provider: [name] Other Clinician: Referring Provider: Treating Provider/Extender: Annice Needy in Treatment: 3 Information Obtained From Patient Eyes Medical History: Negative for: Cataracts; Glaucoma; Optic Neuritis Ear/Nose/Mouth/Throat Medical History: Negative for: Chronic sinus problems/congestion; Middle ear problems Hematologic/Lymphatic Medical History: Positive for: Lymphedema Negative for:  Anemia; Hemophilia; Human Immunodeficiency Virus; Sickle Cell Disease Respiratory Medical History: Positive for: Sleep Apnea Negative for: Aspiration; Asthma; Chronic Obstructive Pulmonary Disease (COPD); Pneumothorax; Tuberculosis Cardiovascular Medical History: Positive for: Congestive Heart Failure; Coronary Artery Disease; Hypertension Negative for: Angina; Arrhythmia; Deep Vein Thrombosis; Hypotension; Myocardial Infarction; Peripheral Arterial Disease; Peripheral Venous Disease; Phlebitis; Vasculitis Gastrointestinal Medical History: Negative for: Cirrhosis ; Colitis; Crohns; Hepatitis A; Hepatitis B; Hepatitis C Endocrine Medical History: Positive for: Type II Diabetes [name], [name] ([id number]) [id number] Page 9 of 10 Negative for: Type I Diabetes Time with diabetes: 2002 Treated with: Oral agents, Diet Blood sugar tested every day: Yes Tested : TID Genitourinary Medical History: Negative for: End Stage Renal Disease Immunological Medical History: Negative for: Lupus Erythematosus; Raynauds; Scleroderma Integumentary (Skin) Medical History: Negative for: History of Burn Musculoskeletal Medical History: Negative for: Gout; Rheumatoid Arthritis; Osteoarthritis; Osteomyelitis Neurologic Medical History: Negative for: Dementia; Neuropathy; Quadriplegia; Paraplegia; Seizure Disorder Oncologic Medical History: Negative for: Received Chemotherapy; Received Radiation Psychiatric Medical History: Negative for: Anorexia/bulimia; Confinement Anxiety Immunizations Pneumococcal Vaccine: Received Pneumococcal Vaccination: No Implantable Devices None Family and Social History Cancer: No; Diabetes: Yes - Mother,Maternal Grandparents; Heart Disease: Yes - Paternal Grandparents; Hereditary Spherocytosis: No; Hypertension: Yes - Mother,Maternal Grandparents; Kidney Disease: No; Lung Disease: Yes - Father,Paternal Grandparents; Seizures: No;  Stroke: No; Thyroid Problems: No; Tuberculosis: No; Former smoker; Marital Status - Single; Alcohol Use: Never; Drug Use: No History; Caffeine Use: Daily; Financial Concerns: No; Food, Clothing or Shelter Needs: No; Support System Lacking: No; Transportation Concerns: No Electronic Signature(s) Signed: 03/26/2022 4:01:47 PM By: Fredirick Maudlin MD FACS Entered By: Fredirick Maudlin on 03/26/2022 15:32:44 -------------------------------------------------------------------------------- SuperBill Details Patient Name: Date of Service: [name], Violett [name]. 03/26/2022 Medical Record Number: [id number] Patient Account Number: [id number] Date of Birth/Sex: Treating RN: April 25, 1971 (51 y.o. F) Primary Care Provider: [name] Other Clinician: Referring Provider: Treating Provider/Extender: Annice Needy in Treatment: 3 [name], [name] ([id number]) [id number] Page 10 of 10 Diagnosis Coding ICD-10 Codes Code Description B15.176 Non-pressure chronic ulcer of left thigh with fat layer exposed I50.30 Unspecified diastolic (congestive) heart failure I87.2 Venous insufficiency (chronic) (peripheral) I89.0 Lymphedema, not elsewhere classified E66.01 Morbid (severe) obesity due to excess calories Facility Procedures : CPT4 Code: 16073710 Description: 62694 - DEBRIDE WOUND 1ST 20 SQ CM OR < ICD-10 Diagnosis Description L97.122 Non-pressure chronic ulcer of left thigh with fat layer exposed Modifier: Quantity: 1 Physician Procedures : CPT4 Code Description Modifier [id number] - WC  PHYS LEVEL 3 - EST PT 25 ICD-10 Diagnosis Description L97.122 Non-pressure chronic ulcer of left thigh with fat layer exposed I50.30 Unspecified diastolic (congestive) heart failure I87.2 Venous  insufficiency (chronic) (peripheral) I89.0 Lymphedema, not elsewhere classified Quantity: 1 : [id number] - WC PHYS DEBR WO ANESTH 20 SQ CM ICD-10 Diagnosis  Description J67.011 Non-pressure chronic ulcer of left thigh with fat layer exposed Quantity: 1 Electronic Signature(s) Signed: 03/26/2022 3:35:54 PM By: Fredirick Maudlin MD FACS Entered By: Fredirick Maudlin on 03/26/2022 15:35:54

## 2022-03-26 NOTE — Progress Notes (Signed)
[name] ([id number]) [id number] Page 1 of 9 Visit Report for 03/26/2022 Arrival Information Details Patient Name: Date of Service: [name], [name] 03/26/2022 2:45 PM Medical Record Number: [id number] Patient Account Number: [id number] Date of Birth/Sex: Treating RN: 04/23/1971 (51 y.o. F) Primary Care Obinna Ehresman: [name] Other Clinician: Referring Aideen Fenster: Treating Hikari Tripp/Extender: Annice Needy in Treatment: 3 Visit Information History Since Last Visit All ordered tests and consults were completed: No Patient Arrived: Ambulatory Added or deleted any medications: No Arrival Time: 14:42 Any new allergies or adverse reactions: No Accompanied By: self Had a fall or experienced change in No Transfer Assistance: None activities of daily living that may affect Patient Identification Verified: Yes risk of falls: Secondary Verification Process Completed: Yes Signs or symptoms of abuse/neglect since last visito No Patient Requires Transmission-Based Precautions: No Hospitalized since last visit: No Patient Has Alerts: Yes Implantable device outside of the clinic excluding No Patient Alerts: Patient on Blood Thinner cellular tissue based products placed in the center ABI R 0.99 08/10/2021 since last visit: ABI L 1.04 08/10/2021 Pain Present Now: Yes Electronic Signature(s) Signed: 03/26/2022 3:32:59 PM By: Worthy Rancher Entered By: Worthy Rancher on 03/26/2022 14:43:12 -------------------------------------------------------------------------------- Encounter Discharge Information Details Patient Name: Date of Service: Megan Session [name]. 03/26/2022 2:45 PM Medical Record Number: [id number] Patient Account Number: [id number] Date of Birth/Sex: Treating RN: Jul 04, 1970 (51 y.o. [name] Primary Care Danila Eddie: [name] Other Clinician: Referring Janaisa Birkland: Treating Lasean Gorniak/Extender: Annice Needy in Treatment: 3 Encounter Discharge Information Items Post Procedure Vitals Discharge Condition: Stable Temperature (F): 97.4 Ambulatory Status: Ambulatory Pulse (bpm): 101 Discharge Destination: Home Respiratory Rate (breaths/min): 18 Transportation: Private Auto Blood Pressure (mmHg): 134/74 Accompanied By: self Schedule Follow-up Appointment: Yes Clinical Summary of Care: Patient Declined Electronic Signature(s) Signed: 03/26/2022 5:04:43 PM By: Baruch Gouty RN, BSN Entered By: Baruch Gouty on 03/26/2022 15:45:07 [name] [name] ([id number]) [id number] Page 2 of 9 -------------------------------------------------------------------------------- Lower Extremity Assessment Details Patient Name: Date of Service: [name], SCHARNHORST [name]. 03/26/2022 2:45 PM Medical Record Number: [id number] Patient Account Number: [id number] Date of Birth/Sex: Treating RN: 1971-02-13 (51 y.o. [name] Primary Care Sonni Barse: [name] Other Clinician: Referring Kiandre Spagnolo: Treating Joncarlos Atkison/Extender: Annice Needy in Treatment: 3 Edema Assessment Assessed: [Left: No] [Right: No] Edema: [Left: Ye] [Right: s] Calf Left: Right: Point of Measurement: 28 cm From Medial Instep 40.5 cm Ankle Left: Right: Point of Measurement: 12 cm From Medial Instep 23 cm Knee To Floor Left: Right: From Medial Instep 38 cm Vascular Assessment Pulses: Dorsalis Pedis Palpable: [Left:Yes] Electronic Signature(s) Signed: 03/26/2022 5:04:43 PM By: Baruch Gouty RN, BSN Entered By: Baruch Gouty on 03/26/2022 15:22:58 -------------------------------------------------------------------------------- Multi Wound Chart Details Patient Name: Date of Service: Megan Gilding, Carmalita [name]. 03/26/2022 2:45 PM Medical Record Number: [id number] Patient Account Number: [id number] Date of Birth/Sex: Treating RN: 01/27/1971 (51  y.o. F) Primary Care Delorus Langwell: [name] Other Clinician: Referring Wende Longstreth: Treating Shonteria Abeln/Extender: Annice Needy in Treatment: 3 Vital Signs Height(in): 69 Capillary Blood Glucose(mg/dl): 125 Weight(lbs): 300 Pulse(bpm): 101 Body Mass Index(BMI): 44.3 Blood Pressure(mmHg): 134/74 Temperature(F): 97.4 Respiratory Rate(breaths/min): 20 [10:Photos:] [N/A:N/A [id number] Page 3 of 9] Left, Medial Lower Leg Left Groin N/A Wound Location: Gradually Appeared Not Known N/A Wounding Event: Diabetic Wound/Ulcer of the Lower Abrasion N/A Primary Etiology: Extremity Lymphedema N/A N/A Secondary Etiology: Lymphedema, Sleep Apnea, Lymphedema, Sleep Apnea, N/A Comorbid History: Congestive Heart Failure, Coronary Congestive Heart Failure, Coronary  Artery Disease, Hypertension, Type II Artery Disease, Hypertension, Type II Diabetes Diabetes 02/09/2022 03/02/2022 N/A Date Acquired: 3 3 N/A Weeks of Treatment: Healed - Epithelialized Open N/A Wound Status: No No N/A Wound Recurrence: Yes No N/A Clustered Wound: 2 N/A N/A Clustered Quantity: 0x0x0 0.9x2x0.1 N/A Measurements L x W x [name] (cm) 0 1.414 N/A A (cm) : rea 0 0.141 N/A Volume (cm) : 100.00% -25.00% N/A % Reduction in A rea: 100.00% -24.80% N/A % Reduction in Volume: Grade 1 Full Thickness Without Exposed N/A Classification: Support Structures None Present Small N/A Exudate A mount: N/A Serosanguineous N/A Exudate Type: N/A red, brown N/A Exudate Color: N/A Distinct, outline attached N/A Wound Margin: None Present (0%) Large (67-100%) N/A Granulation A mount: N/A Red, Pink N/A Granulation Quality: None Present (0%) None Present (0%) N/A Necrotic A mount: Fascia: No Fat Layer (Subcutaneous Tissue): Yes N/A Exposed Structures: Fat Layer (Subcutaneous Tissue): No Fascia: No Tendon: No Tendon: No Muscle: No Muscle: No Joint: No Joint:  No Bone: No Bone: No Large (67-100%) Medium (34-66%) N/A Epithelialization: N/A Debridement - Selective/Open Wound N/A Debridement: Pre-procedure Verification/Time Out N/A 15:30 N/A Taken: N/A Lidocaine 4% Topical Solution N/A Pain Control: N/A Slough N/A Tissue Debrided: N/A Non-Viable Tissue N/A Level: N/A 1.8 N/A Debridement A (sq cm): rea N/A Curette N/A Instrument: N/A Minimum N/A Bleeding: N/A Pressure N/A Hemostasis A chieved: N/A 0 N/A Procedural Pain: N/A 0 N/A Post Procedural Pain: N/A Procedure was tolerated well N/A Debridement Treatment Response: N/A 0.9x2.7x0.1 N/A Post Debridement Measurements L x W x [name] (cm) N/A 0.191 N/A Post Debridement Volume: (cm) Scarring: Yes Excoriation: No N/A Periwound Skin Texture: Excoriation: No Induration: No Induration: No Callus: No Callus: No Crepitus: No Crepitus: No Rash: No Rash: No Scarring: No Maceration: No Maceration: No N/A Periwound Skin Moisture: Dry/Scaly: No Dry/Scaly: No Hemosiderin Staining: Yes Atrophie Blanche: No N/A Periwound Skin Color: Atrophie Blanche: No Cyanosis: No Cyanosis: No Ecchymosis: No Ecchymosis: No Erythema: No Erythema: No Hemosiderin Staining: No Mottled: No Mottled: No Pallor: No Pallor: No Rubor: No Rubor: No No Abnormality No Abnormality N/A Temperature: N/A Yes N/A Tenderness on Palpation: N/A Debridement N/A Procedures Performed: Treatment Notes Electronic Signature(s) Signed: 03/26/2022 3:32:00 PM By: Fredirick Maudlin MD FACS Entered By: Fredirick Maudlin on 03/26/2022 15:31:59 [name] [name] ([id number]) [id number] Page 4 of 9 -------------------------------------------------------------------------------- Multi-Disciplinary Care Plan Details Patient Name: Date of Service: SANAH, KRASK[place] 03/26/2022 2:45 PM Medical Record Number: [id number] Patient Account Number: [id number] Date of Birth/Sex: Treating  RN: December 30, 1970 (51 y.o. [name] Primary Care Ysabel Stankovich: [name] Other Clinician: Referring Harumi Yamin: Treating Waqas Bruhl/Extender: Annice Needy in Treatment: 3 Multidisciplinary Care Plan reviewed with physician Active Inactive Pain, Acute or Chronic Nursing Diagnoses: Pain, acute or chronic: actual or potential Potential alteration in comfort, pain Goals: Patient will verbalize adequate pain control and receive pain control interventions during procedures as needed Date Initiated: 03/05/2022 Target Resolution Date: 04/05/2022 Goal Status: Active Patient/caregiver will verbalize comfort level met Date Initiated: 03/05/2022 Target Resolution Date: 04/05/2022 Goal Status: Active Interventions: Encourage patient to take pain medications as prescribed Provide education on pain management Treatment Activities: Administer pain control measures as ordered : 03/05/2022 Notes: Wound/Skin Impairment Nursing Diagnoses: Knowledge deficit related to ulceration/compromised skin integrity Goals: Patient/caregiver will verbalize understanding of skin care regimen Date Initiated: 03/05/2022 Target Resolution Date: 04/05/2022 Goal Status: Active Interventions: Assess patient/caregiver ability to obtain necessary supplies Assess patient/caregiver ability to perform ulcer/skin care regimen upon admission  and as needed Provide education on ulcer and skin care Treatment Activities: Skin care regimen initiated : 03/05/2022 Topical wound management initiated : 03/05/2022 Notes: Electronic Signature(s) Signed: 03/26/2022 5:04:43 PM By: Baruch Gouty RN, BSN Entered By: Baruch Gouty on 03/26/2022 15:24:11 [name] [name] ([id number]) [id number] Page 5 of 9 -------------------------------------------------------------------------------- Pain Assessment Details Patient Name: Date of Service: AZYIAH, BO [name]. 03/26/2022  2:45 PM Medical Record Number: [id number] Patient Account Number: [id number] Date of Birth/Sex: Treating RN: 21-Oct-1970 (51 y.o. F) Primary Care Verlene Glantz: [name] Other Clinician: Referring Wilmer Santillo: Treating Tahje Borawski/Extender: Annice Needy in Treatment: 3 Active Problems Location of Pain Severity and Description of Pain Patient Has Paino No Site Locations Rate the pain. Current Pain Level: 9 Worst Pain Level: 10 Least Pain Level: 0 Tolerable Pain Level: 4 Pain Management and Medication Current Pain Management: Electronic Signature(s) Signed: 03/26/2022 3:32:59 PM By: Worthy Rancher Entered By: Worthy Rancher on 03/26/2022 14:44:12 -------------------------------------------------------------------------------- Patient/Caregiver Education Details Patient Name: Date of Service: Megan Session [name]. 11/28/2023andnbsp2:45 PM Medical Record Number: [id number] Patient Account Number: [id number] Date of Birth/Gender: Treating RN: Sep 23, 1970 (51 y.o. [name] Primary Care Physician: [name] Other Clinician: Referring Physician: Treating Physician/Extender: Annice Needy in Treatment: 3 Education Assessment Education Provided To: Patient Education Topics Provided Venous: Methods: Explain/Verbal Responses: Reinforcements needed, State content correctly KALEIAH, KUTZER [name] ([id number]) [id number] Page 6 of 9 Wound/Skin Impairment: Methods: Explain/Verbal Responses: Reinforcements needed, State content correctly Electronic Signature(s) Signed: 03/26/2022 5:04:43 PM By: Baruch Gouty RN, BSN Entered By: Baruch Gouty on 03/26/2022 15:24:31 -------------------------------------------------------------------------------- Wound Assessment Details Patient Name: Date of Service: Megan Session [name]. 03/26/2022 2:45 PM Medical Record Number: [id number] Patient Account Number:  [id number] Date of Birth/Sex: Treating RN: [birth year] (51 y.o. [name] Primary Care Valta Dillon: [name] Other Clinician: Referring Natan Hartog: Treating Le Faulcon/Extender: Annice Needy in Treatment: 3 Wound Status Wound Number: 10 Primary Diabetic Wound/Ulcer of the Lower Extremity Etiology: Wound Location: Left, Medial Lower Leg Secondary Lymphedema Wounding Event: Gradually Appeared Etiology: Date Acquired: 02/09/2022 Wound Healed - Epithelialized Weeks Of Treatment: 3 Status: Clustered Wound: Yes Comorbid Lymphedema, Sleep Apnea, Congestive Heart Failure, Coronary History: Artery Disease, Hypertension, Type II Diabetes Photos Wound Measurements Length: (cm) Width: (cm) Depth: (cm) Clustered Quantity: Area: (cm) Volume: (cm) 0 % Reduction in Area: 100% 0 % Reduction in Volume: 100% 0 Epithelialization: Large (67-100%) 2 Tunneling: No 0 Undermining: No 0 Wound Description Classification: Grade 1 Exudate Amount: None Present Foul Odor After Cleansing: No Slough/Fibrino No Wound Bed Granulation Amount: None Present (0%) Exposed Structure Necrotic Amount: None Present (0%) Fascia Exposed: No Fat Layer (Subcutaneous Tissue) Exposed: No Tendon Exposed: No Muscle Exposed: No Joint Exposed: No Bone Exposed: No [address] AVERY, EUSTICE [name] ([id number]) [id number] Page 7 of 9 No Abnormalities Noted: No No Abnormalities Noted: No Callus: No Atrophie Blanche: No Crepitus: No Cyanosis: No Excoriation: No Ecchymosis: No Induration: No Erythema: No Rash: No Hemosiderin Staining: Yes Scarring: Yes Mottled: No Pallor: No Moisture Rubor: No No Abnormalities Noted: No Dry / Scaly: No Temperature / Pain Maceration: No Temperature: No Abnormality Electronic Signature(s) Signed: 03/26/2022 5:04:43 PM By: Baruch Gouty RN, BSN Entered By: Baruch Gouty on 03/26/2022  15:19:58 -------------------------------------------------------------------------------- Wound Assessment Details Patient Name: Date of Service: Megan Gilding, Avantika [name]. 03/26/2022 2:45 PM Medical Record Number: [id number] Patient Account Number: [id number] Date of Birth/Sex: Treating RN: [birth year] (51 y.o. F) Primary Care Arval Brandstetter: [name],  Neoma Laming Other Clinician: Referring Provider: Treating Provider/Extender: Annice Needy in Treatment: 3 Wound Status Wound Number: 9 Primary Abrasion Etiology: Wound Location: Left Groin Wound Open Wounding Event: Not Known Status: Date Acquired: 03/02/2022 Comorbid Lymphedema, Sleep Apnea, Congestive Heart Failure, Coronary Weeks Of Treatment: 3 History: Artery Disease, Hypertension, Type II Diabetes Clustered Wound: No Photos Wound Measurements Length: (cm) 0.9 Width: (cm) 2 Depth: (cm) 0.1 Area: (cm) 1.414 Volume: (cm) 0.141 % Reduction in Area: -25% % Reduction in Volume: -24.8% Epithelialization: Medium (34-66%) Wound Description Classification: Full Thickness Without Exposed Suppor Wound Margin: Distinct, outline attached Exudate Amount: Small Exudate Type: Serosanguineous Exudate Color: red, brown t Structures Foul Odor After Cleansing: No Slough/Fibrino Yes Wound Bed Granulation Amount: Large (67-100%) Exposed Structure Granulation Quality: Red, Pink Fascia Exposed: No Necrotic Amount: None Present (0%) Fat Layer (Subcutaneous Tissue) ExposedMASON, BURLEIGH [name] ([id number]) [id number] Page 8 of 9 Tendon Exposed: No Muscle Exposed: No Joint Exposed: No Bone Exposed: No Periwound Skin Texture Texture Color No Abnormalities Noted: No No Abnormalities Noted: No Callus: No Atrophie Blanche: No Crepitus: No Cyanosis: No Excoriation: No Ecchymosis: No Induration: No Erythema: No Rash: No Hemosiderin Staining: No Scarring: No Mottled: No Pallor:  No Moisture Rubor: No No Abnormalities Noted: No Dry / Scaly: No Temperature / Pain Maceration: No Temperature: No Abnormality Tenderness on Palpation: Yes Treatment Notes Wound #9 (Groin) Wound Laterality: Left Cleanser Soap and Water Discharge Instruction: May shower and wash wound with dial antibacterial soap and water prior to dressing change. Peri-Wound Care Topical Primary Dressing KerraCel Ag Gelling Fiber Dressing, 4x5 in (silver alginate) Discharge Instruction: Apply silver alginate to wound bed as instructed Secondary Dressing Woven Gauze Sponge, Non-Sterile 4x4 in Discharge Instruction: Apply over primary dressing as directed. Secured With 37M Medipore H Soft Cloth Surgical T ape, 4 x 10 (in/yd) Discharge Instruction: Secure with tape as directed. Compression Wrap Compression Stockings Add-Ons Electronic Signature(s) Signed: 03/26/2022 5:04:43 PM By: Baruch Gouty RN, BSN Entered By: Baruch Gouty on 03/26/2022 15:20:51 -------------------------------------------------------------------------------- Vitals Details Patient Name: Date of Service: Megan Gilding, Emalia [name]. 03/26/2022 2:45 PM Medical Record Number: [id number] Patient Account Number: [id number] Date of Birth/Sex: Treating RN: [birth year] (51 y.o. F) Primary Care Provider: [name] Other Clinician: Referring Provider: Treating Provider/Extender: Annice Needy in Treatment: 3 Vital Signs Time Taken: 02:40 Temperature (F): 97.4 Height (in): 69 Pulse (bpm): 101 Weight (lbs): 300 Respiratory Rate (breaths/min): 20 JULENE, RAHN [name] ([id number]) [phone].pdf Page 9 of 9 Body Mass Index (BMI): 44.3 Blood Pressure (mmHg): 134/74 Capillary Blood Glucose (mg/dl): 125 Reference Range: 80 - 120 mg / dl Electronic Signature(s) Signed: 03/26/2022 3:32:59 PM By: Worthy Rancher Entered By: Worthy Rancher on 03/26/2022 14:43:50

## 2022-03-28 ENCOUNTER — Other Ambulatory Visit: Payer: Self-pay | Admitting: *Deleted

## 2022-03-28 DIAGNOSIS — D500 Iron deficiency anemia secondary to blood loss (chronic): Secondary | ICD-10-CM

## 2022-03-29 ENCOUNTER — Inpatient Hospital Stay: Payer: Medicaid Other

## 2022-03-29 ENCOUNTER — Other Ambulatory Visit: Payer: Medicaid Other

## 2022-03-29 ENCOUNTER — Other Ambulatory Visit: Payer: Self-pay

## 2022-03-29 ENCOUNTER — Inpatient Hospital Stay: Payer: Medicaid Other | Attending: Hematology | Admitting: Hematology

## 2022-03-29 VITALS — BP 127/90 | HR 90 | Temp 98.2°F | Resp 20 | Ht 62.0 in | Wt 309.3 lb

## 2022-03-29 DIAGNOSIS — D500 Iron deficiency anemia secondary to blood loss (chronic): Secondary | ICD-10-CM | POA: Diagnosis not present

## 2022-03-29 DIAGNOSIS — Z7901 Long term (current) use of anticoagulants: Secondary | ICD-10-CM | POA: Insufficient documentation

## 2022-03-29 DIAGNOSIS — Z87891 Personal history of nicotine dependence: Secondary | ICD-10-CM | POA: Diagnosis not present

## 2022-03-29 DIAGNOSIS — D509 Iron deficiency anemia, unspecified: Secondary | ICD-10-CM | POA: Diagnosis present

## 2022-03-29 LAB — CMP (CANCER CENTER ONLY)
ALT: 10 U/L (ref 0–44)
AST: 11 U/L — ABNORMAL LOW (ref 15–41)
Albumin: 4.1 g/dL (ref 3.5–5.0)
Alkaline Phosphatase: 66 U/L (ref 38–126)
Anion gap: 7 (ref 5–15)
BUN: 7 mg/dL (ref 6–20)
CO2: 29 mmol/L (ref 22–32)
Calcium: 9.7 mg/dL (ref 8.9–10.3)
Chloride: 105 mmol/L (ref 98–111)
Creatinine: 0.64 mg/dL (ref 0.44–1.00)
GFR, Estimated: >60 mL/min (ref >60)
Glucose, Bld: 131 mg/dL — ABNORMAL HIGH (ref 70–99)
Potassium: 3.7 mmol/L (ref 3.5–5.1)
Sodium: 141 mmol/L (ref 135–145)
Total Bilirubin: 1 mg/dL (ref 0.3–1.2)
Total Protein: 7.7 g/dL (ref 6.5–8.1)

## 2022-03-29 LAB — CBC WITH DIFFERENTIAL (CANCER CENTER ONLY)
Abs Immature Granulocytes: 0.02 10*3/uL (ref 0.00–0.07)
Basophils Absolute: 0 10*3/uL (ref 0.0–0.1)
Basophils Relative: 0 %
Eosinophils Absolute: 0.1 10*3/uL (ref 0.0–0.5)
Eosinophils Relative: 2 %
HCT: 37.5 % (ref 36.0–46.0)
Hemoglobin: 11.5 g/dL — ABNORMAL LOW (ref 12.0–15.0)
Immature Granulocytes: 0 %
Lymphocytes Relative: 11 %
Lymphs Abs: 0.7 10*3/uL (ref 0.7–4.0)
MCH: 27.8 pg (ref 26.0–34.0)
MCHC: 30.7 g/dL (ref 30.0–36.0)
MCV: 90.8 fL (ref 80.0–100.0)
Monocytes Absolute: 0.4 10*3/uL (ref 0.1–1.0)
Monocytes Relative: 7 %
Neutro Abs: 4.6 10*3/uL (ref 1.7–7.7)
Neutrophils Relative %: 80 %
Platelet Count: 101 10*3/uL — ABNORMAL LOW (ref 150–400)
RBC: 4.13 MIL/uL (ref 3.87–5.11)
RDW: 14.6 % (ref 11.5–15.5)
WBC Count: 5.9 10*3/uL (ref 4.0–10.5)
nRBC: 0 % (ref 0.0–0.2)

## 2022-03-29 LAB — IRON AND IRON BINDING CAPACITY (CC-WL,HP ONLY)
Iron: 50 ug/dL (ref 28–170)
Saturation Ratios: 17 % (ref 10.4–31.8)
TIBC: 290 ug/dL (ref 250–450)
UIBC: 240 ug/dL (ref 148–442)

## 2022-03-29 LAB — FERRITIN: Ferritin: 304 ng/mL (ref 11–307)

## 2022-03-29 NOTE — Progress Notes (Signed)
HEMATOLOGY/ONCOLOGY CLINIC NOTE  Date of Service: 03/29/22    Patient Care Team: Ladell Pier, MD as PCP - General (Internal Medicine) Leonie Man, MD as PCP - Cardiology (Cardiology) Craft, Lorel Monaco, RN as Case Manager Greg Cutter, LCSW as [place] Management (Licensed Clinical Social Worker) [name] as Social Worker  CHIEF COMPLAINTS/PURPOSE OF FOLLOW UP:  Follow-up for continued management of iron deficiency anemia  HISTORY OF PRESENTING ILLNESS:  [name] is a wonderful 51 y.o. female who has been referred to [place] by Dr. [name] for evaluation and management of anemia. The pt reports that she is doing well overall.   The pt reports that she has had anemia since birth but is unaware of any genetic condition causing her anemia. She received IV iron for about 4 years but stopped 2 years ago when she moved to [place]. Pt had a Port-a-Cath placed for repeat IV Iron Infusions due to issues accessing her veins.   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 She has been on anticoagulation for nearly 10 years and denies any repeat blood clots since starting Eliquis. Her PCP is currently managing her anticoagulation. 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 The ulcer tends to heal in the winter and reopen in the summer. It is currently healed with no open area.  Pt had a Colonoscopy and EGD at [facility] in [place] in August of 2021 that were benign. She was experiencing bloody stools frequently but hasn't had any after her 2021 GI studies. She has discontinued Aspirin due to concerns for excessive blood loss.   Pt has had a bilateral oophorectomies. She does not recall being diagnosed with PCOS. She denies gluten intolerance or any thyroid dysfunction. Pt had a fall in 2019 that is causing ongong back pain. She  was seen after the fall and was found to have injury at L1 & L2. She has been given Tylenol for pain management. Pt has a history of chronic acid suppression. She had stopped but recently restarted Prilosec. She was Vitamin B12 deficient for about one year. Pt had a heart attack in but denies any history of liver disease. Pt has had her COVID19 vaccines and booster.  Most recent lab results  of CBC w/diff is as follows: all values are WNL except for RBC at 3.80, Hgb at 10.1, HCT at 35.3, MCHC at 28.6, RDW at 16.2, PLT at 148K.  On review of systems, pt reports fatigue and denies nose bleeds, gum bleeds, bloody stools, heavy menstrual losses, fevers, unexpected weight loss, chills, new back pain, abdominal pain and any other symptoms.   On PMHx the pt reports B/L Oophorectomy, Myocardial infarction, DVT, Iron deficiency anemia due to chronic blood loss.  INTERVAL HISTORY  [name] is a wonderful 51 y.o. female who is here for continued valuation and management of her anemia.    She was last seen by me on 09/20/2021. She had received 1 dose of IV Injectafer prior to that. She was admitted in the hospital in January 2023 for acute on chronic congestive heart failure.  Today, she states that she has been doing okay. She was hospitalized from 11/7-11/13/2023 for a COPD exacerbation. She was started on a steroid taper at that time with relief of her symptoms. She reports that she was positive for flu at  that time. She received Tamiflu in the hospital. She quit smoking in 2005.  She denies any dark or bloody stools. She does not take any PO iron. She only receives infusions. Today, her iron is 50, TIBC is 290 and iron saturation is 17. HGB improved to 11.5.  Her weight has decreased from 342lbs to 309lbs since May 2023. She is tolerating Ozempic well.  She denies signs of infection such as sore throat, sinus drainage, cough, or urinary symptoms.  She denies fevers or recurrent chills. She  denies pain. She denies nausea, vomiting, chest pain, dyspnea or cough.   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   Morbid obesity due to excess calories (HCC)    OSA (obstructive sleep apnea) 07/11/2021   Sleep study April 06, 2019 (Dr. Halford Chessman): Moderate OSA (AHI of 24.4 and SPO2 low of 77%).  Majority events during REM sleep.-recommend CPAP, oral appliance or surgical. Reviewed sleep hygiene.  Avoid sedatives.     Osteoarthritis of left knee    Prediabetes    Small bowel obstruction ([place])    as a child   Speech impediment    Stutter / stammer    SURGICAL HISTORY: Past Surgical History:  Procedure Laterality Date   ABDOMINAL WALL DEFECT REPAIR  1970   IR CV LINE INJECTION  10/24/2020   IR REMOVAL TUN ACCESS W/ PORT W/O FL MOD SED  05/28/2021   IVC FILTER INSERTION  2017   Lower Extremity Venous Duplex  06/23/2020   No evidence of DVT or superficial thrombosis bilaterally.  No evidence of deep venous insufficiency bilaterally.  No evidence of SSV reflux.  Right GSV in the calf has reflux, no reflux in L GSV.;  Repeated in July  2020-no DVT   [place]  2014   TRANSTHORACIC ECHOCARDIOGRAM  05/18/2021   EF 60 to 65%.  No RWMA.  Mild concentric LVH.  "Normal diastolic parameters ".  Normal longitudinal strain.  Normal PAP, RAP.  Normal aortic and mitral valves.==> In July 2022, echo read as GR 1 DD otherwise stable.    SOCIAL HISTORY: Social History   Socioeconomic History   Marital status: Single    Spouse name: Not on file   Number of children: 0   Years of education: Not on file   Highest education level: 12th grade  Occupational History   Occupation: unemployed on disablity  Tobacco Use   Smoking status: Former    Packs/day: 0.25    Years: 18.00    Total pack years: 4.50    Types:  Cigarettes    Quit date: 04/29/2002    Years since quitting: 19.9   Smokeless tobacco: Never  Vaping Use   Vaping Use: Never used  Substance and Sexual Activity   Alcohol use: Not Currently   Drug use: Not Currently   Sexual activity: Not Currently  Other Topics Concern   Not on file  Social History Narrative   Not on file   Social Determinants of Health   Financial Resource Strain: Low Risk  (09/06/2021)   Overall Financial Resource Strain (CARDIA)    Difficulty of Paying Living Expenses: Not very hard  Food Insecurity: No Food Insecurity (03/10/2022)   Hunger Vital Sign    Worried About Running Out of Food in the Last Year: Never true    Ran Out of Food in the Last Year: Never true  Transportation Needs: No Transportation Needs (03/10/2022)   PRAPARE - Hydrologist (Medical): No    Lack of Transportation (Non-Medical): No  Physical Activity: Sufficiently Active (12/14/2021)   Exercise Vital Sign    Days of Exercise per Week: 7 days    Minutes of Exercise per Session: 30 min  Stress: Stress Concern Present (02/27/2022)   [place]    Feeling of Stress : To some  extent  Social Connections: Socially Isolated (09/06/2021)   Social Connection and Isolation Panel [NHANES]    Frequency of Communication with Friends and Family: More than three times a week    Frequency of Social Gatherings with Friends and Family: More than three times a week    Attends Religious Services: Never    Marine scientist or Organizations: No    Attends Archivist Meetings: Never    Marital Status: Never married  Intimate Partner Violence: Not At Risk (03/10/2022)   Humiliation, Afraid, Rape, and Kick questionnaire    Fear of Current or Ex-Partner: No    Emotionally Abused: No    Physically Abused: No    Sexually Abused: No    FAMILY HISTORY: Family History  Problem Relation Age of Onset   Diabetes Mellitus II Mother    COPD Father    Diabetes Father    Diabetes Mellitus II Maternal Grandmother    Breast cancer Paternal Grandfather     ALLERGIES:  is allergic to ace inhibitors, aspirin, hydromorphone, vancomycin, contrast media [iodinated contrast media], dilaudid [hydromorphone hcl], and lidocaine.  MEDICATIONS:  Current Outpatient Medications  Medication Sig Dispense Refill   Accu-Chek Softclix Lancets lancets Use as instructed (Patient taking differently: 1 each by Other route See admin instructions. Use as instructed) 100 each 12   acetaminophen (TYLENOL) 500 MG tablet Take 1 tablet (500 mg total) by mouth every 6 (six) hours as needed. (Patient taking differently: Take 500 mg by mouth every 6 (six) hours as needed for mild pain.) 60 tablet 0   acetaminophen (TYLENOL) 650 MG CR tablet Take 650 mg by mouth every 8 (eight) hours as needed for pain.     albuterol (PROAIR HFA) 108 (90 Base) MCG/ACT inhaler INHALE TWO PUFFS BY MOUTH EVERY 6 HOURS AS NEEDED FOR WHEEZING OR SHORTNESS OF BREATH 8.5 g 0   amLODipine (NORVASC) 5 MG tablet TAKE 1/2 TABLET BY MOUTH DAILY 45 tablet 1   apixaban (ELIQUIS) 5 MG TABS tablet Take 1 tablet (5 mg total) by mouth 2  (two) times daily. 180 tablet 1   atorvastatin (LIPITOR) 40 MG tablet Take 1 tablet (  40 mg total) by mouth daily. 90 tablet 1   Blood Glucose Calibration (ACCU-CHEK GUIDE CONTROL) LIQD 1 each by In Vitro route daily. 1 each 4   Blood Glucose Monitoring Suppl (ACCU-CHEK GUIDE) w/Device KIT 1 each by Does not apply route 2 (two) times daily. 1 kit 0   busPIRone (BUSPAR) 7.5 MG tablet Take 1 tablet by mouth 2 (two) times daily.     Carboxymethylcellulose Sodium (EYE DROPS OP) Place 1 drop into both eyes daily as needed (dry eyes).     carvedilol (COREG) 25 MG tablet Take 1 tablet (25 mg total) by mouth 2 (two) times daily with a meal. 60 tablet 3   cholecalciferol (VITAMIN D3) 25 MCG (1000 UNIT) tablet Take 1 tablet (1,000 Units total) by mouth daily. 30 tablet 5   FARXIGA 10 MG TABS tablet TAKE 1 TABLET BY MOUTH DAILY 90 tablet 0   FEROSUL 325 (65 Fe) MG tablet TAKE ONE TABLET BY MOUTH DAILY WITH BREAKFAST 100 tablet 0   furosemide (LASIX) 80 MG tablet Take 1 tablet (80 mg total) by mouth daily. Take additional dose of lasix for weight gain, leg swelling 30 tablet 4   gabapentin (NEURONTIN) 100 MG capsule Take 1 capsule (100 mg total) by mouth 3 (three) times daily. 90 capsule 3   glucose blood test strip Use as instructed (Patient taking differently: 1 each by Other route See admin instructions. Use as instructed) 100 each 12   hydrocortisone cream 1 % Apply 1 application topically daily as needed for itching.     hydrOXYzine (VISTARIL) 25 MG capsule Take 1 capsule (25 mg total) by mouth at bedtime as needed. (Patient taking differently: Take 25 mg by mouth at bedtime as needed for anxiety or itching.) 30 capsule 1   methocarbamol (ROBAXIN) 500 MG tablet Take 1 tablet (500 mg total) by mouth 2 (two) times daily as needed for muscle spasms. 60 tablet 1   methylPREDNISolone (MEDROL DOSEPAK) 4 MG TBPK tablet Medrol Dosepak take as instructed 21 tablet 0   mometasone-formoterol (DULERA) 200-5 MCG/ACT  AERO INHALE 2 PUFFS BY MOUTH EVERY MORNING AND INHALE TWO PUFFS BY MOUTH EVERY NIGHT AT BEDTIME (Patient taking differently: Inhale 2 puffs into the lungs 2 (two) times daily.) 13 g 3   neomycin-bacitracin-polymyxin [phone] OINT Apply 1 Application topically 2 (two) times daily. 15 g 0   nitroGLYCERIN (NITROSTAT) 0.4 MG SL tablet Place 1 tablet (0.4 mg total) under the tongue every 5 (five) minutes as needed for chest pain. 30 tablet 0   PARoxetine (PAXIL) 20 MG tablet Take 1 tablet (20 mg total) by mouth daily. 30 tablet 3   potassium chloride SA (KLOR-CON M20) 20 MEQ tablet Take 1 tablet (20 mEq total) by mouth daily. 30 tablet 4   Semaglutide, 1 MG/DOSE, 4 MG/3ML SOPN Inject 1 mg as directed once a week. 3 mL 2   spironolactone (ALDACTONE) 25 MG tablet Take 1 tablet (25 mg total) by mouth daily. 30 tablet 3   vitamin B-12 (CYANOCOBALAMIN) 1000 MCG tablet Take 1,000 mcg by mouth daily.     No current facility-administered medications for this visit.    REVIEW OF SYSTEMS:   10 Point review of Systems was done is negative except as noted above.  PHYSICAL EXAMINATION: ECOG PERFORMANCE STATUS: 2 - Symptomatic, <50% confined to bed  . Vitals:   03/29/22 1417  BP: (!) 127/90  Pulse: 90  Resp: 20  Temp: 98.2 F (36.8 C)  SpO2: 100%  Filed Weights   03/29/22 1417  Weight: (!) 309 lb 4.8 oz (140.3 kg)   Body mass index is 56.57 kg/m.    GENERAL:alert, in no acute distress and comfortable SKIN: no acute rashes, no significant lesions EYES: conjunctiva are pink and non-injected, sclera anicteric OROPHARYNX: MMM, no exudates, no oropharyngeal erythema or ulceration NECK: supple, no JVD LYMPH:  no palpable lymphadenopathy in the cervical, axillary or inguinal regions LUNGS: clear to auscultation b/l with normal respiratory effort HEART: regular rate & rhythm ABDOMEN:  normoactive bowel sounds , non tender, not distended. Extremity: no pedal edema PSYCH: alert & oriented x 3  with fluent speech NEURO: no focal motor/sensory deficits   LABORATORY DATA:  I have reviewed the data as listed     Latest Ref Rng & Units 03/29/2022    1:01 PM 03/07/2022    3:29 AM 03/05/2022    4:18 PM  CBC  WBC 4.0 - 10.5 K/uL 5.9  3.4  4.0   Hemoglobin 12.0 - 15.0 g/dL 11.5  10.8  11.1   Hematocrit 36.0 - 46.0 % 37.5  37.0  37.6   Platelets 150 - 400 K/uL 101  136  106    . CBC    Component Value Date/Time   WBC 5.9 03/29/2022 1301   WBC 3.4 (L) 03/07/2022 0329   RBC 4.13 03/29/2022 1301   HGB 11.5 (L) 03/29/2022 1301   HGB 12.3 01/31/2022 1341   HCT 37.5 03/29/2022 1301   HCT 38.8 01/31/2022 1341   PLT 101 (L) 03/29/2022 1301   PLT 109 (L) 01/31/2022 1341   MCV 90.8 03/29/2022 1301   MCV 87 01/31/2022 1341   MCH 27.8 03/29/2022 1301   MCHC 30.7 03/29/2022 1301   RDW 14.6 03/29/2022 1301   RDW 12.3 01/31/2022 1341   LYMPHSABS 0.7 03/29/2022 1301   LYMPHSABS 0.5 (L) 11/29/2020 1445   MONOABS 0.4 03/29/2022 1301   EOSABS 0.1 03/29/2022 1301   EOSABS 0.0 11/29/2020 1445   BASOSABS 0.0 03/29/2022 1301   BASOSABS 0.0 11/29/2020 1445    .    Latest Ref Rng & Units 03/29/2022    1:01 PM 03/07/2022    3:29 AM 03/05/2022    4:18 PM  CMP  Glucose 70 - 99 mg/dL 131  161  91   BUN 6 - 20 mg/dL _0 Creatinine 0.44 - 1.00 mg/dL 0.64  0.71  0.74   Sodium 135 - 145 mmol/L 141  139  140   Potassium 3.5 - 5.1 mmol/L 3.7  4.4  3.3   Chloride 98 - 111 mmol/L 105  102  102   CO2 22 - 32 mmol/L _1 Calcium 8.9 - 10.3 mg/dL 9.7  9.1  9.2   Total Protein 6.5 - 8.1 g/dL 7.7     Total Bilirubin 0.3 - 1.2 mg/dL 1.0     Alkaline Phos 38 - 126 U/L 66     AST 15 - 41 U/L 11     ALT 0 - 44 U/L 10      . Lab Results  Component Value Date   IRON 50 03/29/2022   TIBC 290 03/29/2022   IRONPCTSAT 17 03/29/2022   (Iron and TIBC)  Lab Results  Component Value Date   FERRITIN 58 09/20/2021    RADIOGRAPHIC STUDIES: I have personally reviewed the radiological  images as listed and agreed with the findings in the report. DG Chest Portable  1 View  Result Date: 03/05/2022 CLINICAL DATA:  Shortness of breath. EXAM: PORTABLE CHEST 1 VIEW COMPARISON:  February 07, 2022. FINDINGS: The heart size and mediastinal contours are within normal limits. Both lungs are clear. The visualized skeletal structures are unremarkable. IMPRESSION: No active disease. Electronically Signed   By: Marijo Conception M.D.   On: 03/05/2022 18:07     ASSESSMENT & PLAN:   51 y.o. with   1) Chronic iron deficiency anemia requiring recurrent IV Iron infusions. Likely from chronic GI losses and previously due to blood loss from venous stasis ulcer in the setting of chronic anticoagulation. 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 Patient Active Problem List   Diagnosis Date Noted   Influenza A 03/06/2022   COPD with acute exacerbation ([place]) 03/05/2022   Hyperlipidemia with target LDL less than 100 02/17/2022   Pain in thoracic spine 09/25/2021   Low back pain 09/25/2021   OSA (obstructive sleep apnea) 07/11/2021   Depression 05/28/2021   Encounter for care related to Port-a-Cath 05/27/2021   Acute on chronic respiratory failure with hypoxia ([place]) 05/26/2021   Morbid obesity with BMI of 50.0-59.9, adult ([place]) 05/26/2021   Acute respiratory failure ([name]) 11/23/2020   Acute respiratory failure with hypoxia ([place]) 11/22/2020   Fever 11/22/2020   Bilateral lower extremity edema 11/22/2020   History of DVT (deep vein thrombosis) 11/22/2020   Asthma, chronic, moderate persistent, with acute exacerbation 11/22/2020   Lower limb ulcer, calf, left, limited to breakdown of skin ([place]) 06/09/2020   Lymphedema 06/09/2020   Iron deficiency anemia due to chronic blood loss 05/10/2020   Chronic deep vein thrombosis (DVT) of calf muscle vein of left lower  extremity ([place]) 04/13/2020   Venous insufficiency 04/13/2020   S/P insertion of IVC (inferior vena caval) filter 04/13/2020   Chronic anemia 04/13/2020   Essential hypertension 04/13/2020   (HFpEF) heart failure with preserved ejection fraction (HCC) -> although echo suggests normal diastolic parameters with normal left atrial size 04/13/2020   CAD (coronary artery disease) 11/17/2019   PLAN: -Patient notes no overt GI bleeding. -Her labs done today were reviewed with her in detail. -Today, her iron is 50, TIBC is 290 and iron saturation is 17. HGB improved from 10.8 to 11.5 since 03/07/22. -CMP unremarkable -Continue her vitamin B complex to support accelerated erythropoiesis -Return to clinic with [name] with labs in 6 months  FOLLOW UP: RTC with Dr Irene Limbo with port flush and labs in 6 months   No orders of the defined types were placed in this encounter.  The total time spent in the appointment was *** minutes* .  All of the patient's questions were answered with apparent satisfaction. The patient knows to call the clinic with any problems, questions or concerns.   I,Alexis Herring,acting as a Education administrator for Sullivan Lone, MD.,have documented all relevant documentation on the behalf of Sullivan Lone, MD,as directed by  Sullivan Lone, MD while in the presence of Sullivan Lone, MD.  ***  Sullivan Lone MD [place] AAHIVMS [facility] Hematology/Oncology Physician [facility]  .*Total Encounter Time as defined by the Centers for Medicare and Medicaid Services includes, in addition to the face-to-face time of a patient visit (documented in the note above) non-face-to-face time: obtaining and reviewing outside history, ordering and reviewing medications, tests or procedures, care coordination (communications with other health  care professionals or caregivers) and documentation in the medical record.

## 2022-04-02 ENCOUNTER — Other Ambulatory Visit: Payer: Medicaid Other | Admitting: Obstetrics and Gynecology

## 2022-04-02 NOTE — Patient Outreach (Signed)
  Medicaid Managed Care   Unsuccessful Attempt Note   04/02/2022 Name: [name] MRN: [id number] DOB: 14-Sep-1970  Referred by: Ladell Pier, MD Reason for referral : High Risk Managed Medicaid (Unsuccessful telephone outreach)   An unsuccessful telephone outreach was attempted today. The patient was referred to the case management team for assistance with care management and care coordination.    Follow Up Plan: The Managed Medicaid care management team will reach out to the patient again over the next 30 business  days. and The  Patient has been provided with contact information for the Managed Medicaid care management team and has been advised to call with any health related questions or concerns.    [name], BSN [place]  [facility] [employer] - Managed Medicaid High Risk [phone].

## 2022-04-02 NOTE — Patient Instructions (Signed)
Hi [name], sorry to have missed you today-I hope you are doing okay- as a part of your Medicaid benefit, you are eligible for care management and care coordination services at no cost or copay. I was unable to reach you by phone today but would be happy to help you with your health related needs. Please feel free to call me at [phone].  A member of the Managed Medicaid care management team will reach out to you again over the next 30 business  days.   [name], BSN [place]  [facility] - Managed Medicaid High Risk [phone]

## 2022-04-03 ENCOUNTER — Encounter (HOSPITAL_BASED_OUTPATIENT_CLINIC_OR_DEPARTMENT_OTHER): Payer: Medicaid Other | Attending: General Surgery | Admitting: General Surgery

## 2022-04-03 DIAGNOSIS — Z833 Family history of diabetes mellitus: Secondary | ICD-10-CM | POA: Diagnosis not present

## 2022-04-03 DIAGNOSIS — I110 Hypertensive heart disease with heart failure: Secondary | ICD-10-CM | POA: Diagnosis not present

## 2022-04-03 DIAGNOSIS — I5032 Chronic diastolic (congestive) heart failure: Secondary | ICD-10-CM | POA: Insufficient documentation

## 2022-04-03 DIAGNOSIS — I890 Lymphedema, not elsewhere classified: Secondary | ICD-10-CM | POA: Diagnosis not present

## 2022-04-03 DIAGNOSIS — Z8249 Family history of ischemic heart disease and other diseases of the circulatory system: Secondary | ICD-10-CM | POA: Diagnosis not present

## 2022-04-03 DIAGNOSIS — L97122 Non-pressure chronic ulcer of left thigh with fat layer exposed: Secondary | ICD-10-CM | POA: Insufficient documentation

## 2022-04-03 DIAGNOSIS — S30811A Abrasion of abdominal wall, initial encounter: Secondary | ICD-10-CM | POA: Diagnosis not present

## 2022-04-03 DIAGNOSIS — Z6841 Body Mass Index (BMI) 40.0 and over, adult: Secondary | ICD-10-CM | POA: Insufficient documentation

## 2022-04-03 DIAGNOSIS — L97822 Non-pressure chronic ulcer of other part of left lower leg with fat layer exposed: Secondary | ICD-10-CM | POA: Diagnosis not present

## 2022-04-03 DIAGNOSIS — Z87891 Personal history of nicotine dependence: Secondary | ICD-10-CM | POA: Insufficient documentation

## 2022-04-03 DIAGNOSIS — I872 Venous insufficiency (chronic) (peripheral): Secondary | ICD-10-CM | POA: Diagnosis not present

## 2022-04-04 ENCOUNTER — Encounter: Payer: Self-pay | Admitting: Hematology

## 2022-04-04 NOTE — Progress Notes (Addendum)
Megan, FRANKEN [name] ([id number]) [id number] Page 1 of 8 Visit Report for 04/03/2022 Arrival Information Details Patient Name: Date of Service: [name], [name]. 04/03/2022 2:30 PM Medical Record Number: [id number] Patient Account Number: [id number] Date of Birth/Sex: Treating RN: 05-18-1970 (51 y.o. F) Primary Care Stachia Slutsky: [name] Other Clinician: Referring Anne Boltz: Treating Natania Finigan/Extender: Annice Needy in Treatment: 4 Visit Information History Since Last Visit All ordered tests and consults were completed: No Patient Arrived: Ambulatory Added or deleted any medications: No Arrival Time: 14:50 Any new allergies or adverse reactions: No Accompanied By: self Had a fall or experienced change in No Transfer Assistance: [name] activities of daily living that may affect Patient Identification Verified: Yes risk of falls: Secondary Verification Process Completed: Yes Signs or symptoms of abuse/neglect since last visito No Patient Requires Transmission-Based Precautions: No Hospitalized since last visit: No Patient Has Alerts: Yes Implantable device outside of the clinic excluding No Patient Alerts: Patient on Blood Thinner cellular tissue based products placed in the center ABI R 0.99 08/10/2021 since last visit: ABI L 1.04 08/10/2021 Pain Present Now: No Electronic Signature(s) Signed: 04/03/2022 4:14:15 PM By: Worthy Rancher Entered By: Worthy Rancher on 04/03/2022 14:50:56 -------------------------------------------------------------------------------- Encounter Discharge Information Details Patient Name: Date of Service: Megan Session [name]. 04/03/2022 2:30 PM Medical Record Number: [id number] Patient Account Number: [id number] Date of Birth/Sex: Treating RN: 1971-03-26 (51 y.o. [name] Primary Care Lavern Maslow: [name] Other Clinician: Referring Jeannette Maddy: Treating Zak Gondek/Extender: Annice Needy in Treatment: 4 Encounter Discharge Information Items Post Procedure Vitals Discharge Condition: Stable Temperature (F): 97.7 Ambulatory Status: Ambulatory Pulse (bpm): 88 Discharge Destination: Home Respiratory Rate (breaths/min): 20 Transportation: Private Auto Blood Pressure (mmHg): 131/91 Accompanied By: self Schedule Follow-up Appointment: Yes Clinical Summary of Care: Patient Declined Electronic Signature(s) Signed: 04/03/2022 3:51:52 PM By: Dellie Catholic RN Entered By: Dellie Catholic on 04/03/2022 15:46:17 [name] [name] ([id number]) [id number] Page 2 of 8 -------------------------------------------------------------------------------- Lower Extremity Assessment Details Patient Name: Date of Service: [name], [name]. 04/03/2022 2:30 PM Medical Record Number: [id number] Patient Account Number: [id number] Date of Birth/Sex: Treating RN: [birth year] (51 y.o. [name] Primary Care Verla Bryngelson: [name] Other Clinician: Referring Annalisse Minkoff: Treating Daleena Rotter/Extender: Annice Needy in Treatment: 4 Edema Assessment Assessed: [Left: No] [Right: No] Edema: [Left: Ye] [Right: s] Calf Left: Right: Point of Measurement: 28 cm From Medial Instep 40.5 cm Ankle Left: Right: Point of Measurement: 12 cm From Medial Instep 23 cm Vascular Assessment Pulses: Dorsalis Pedis Palpable: [Left:Yes] Electronic Signature(s) Signed: 04/03/2022 3:51:52 PM By: Dellie Catholic RN Entered By: Dellie Catholic on 04/03/2022 15:00:11 -------------------------------------------------------------------------------- Multi Wound Chart Details Patient Name: Date of Service: [name], [name]. 04/03/2022 2:30 PM Medical Record Number: [id number] Patient Account Number: [id number] Date of Birth/Sex: Treating RN: 09/01/1970 (51 y.o. F) Primary Care Jadelin Eng: [name] Other  Clinician: Referring Sheriece Jefcoat: Treating [place] Pettengill/Extender: Annice Needy in Treatment: 4 Vital Signs Height(in): 69 Capillary Blood Glucose(mg/dl): 180 Weight(lbs): 300 Pulse(bpm): 50 Body Mass Index(BMI): 44.3 Blood Pressure(mmHg): 131/91 Temperature(F): 97.7 Respiratory Rate(breaths/min): 20 [9:Photos:] [N/A:N/A] Left Groin N/A N/A Wound Location: Not Known N/A N/A Wounding Event: Abrasion N/A N/A Primary Etiology: Lymphedema, Sleep Apnea, N/A N/A Comorbid History: Congestive Heart Failure, Coronary Artery Disease, Hypertension, Type II Diabetes 03/02/2022 N/A N/A Date Acquired: 4 N/A N/A Weeks of Treatment: Open N/A N/A Wound Status: No N/A N/A Wound Recurrence: 0.8x1x0.1 N/A N/A Measurements L x W x [name] (cm)  0.628 N/A N/A A (cm) : rea 0.063 N/A N/A Volume (cm) : 44.50% N/A N/A % Reduction in A rea: 44.20% N/A N/A % Reduction in Volume: Full Thickness Without Exposed N/A N/A Classification: Support Structures Small N/A N/A Exudate A mount: Serosanguineous N/A N/A Exudate Type: red, [name] N/A N/A Exudate Color: Distinct, outline attached N/A N/A Wound Margin: Large (67-100%) N/A N/A Granulation A mount: Red, Pink N/A N/A Granulation Quality: [name] Present (0%) N/A N/A Necrotic A mount: Fat Layer (Subcutaneous Tissue): Yes N/A N/A Exposed Structures: Fascia: No Tendon: No Muscle: No Joint: No Bone: No Medium (34-66%) N/A N/A Epithelialization: Debridement - Selective/Open Wound N/A N/A Debridement: Pre-procedure Verification/Time Out 15:10 N/A N/A Taken: Lidocaine 4% Topical Solution N/A N/A Pain Control: Slough N/A N/A Tissue Debrided: Non-Viable Tissue N/A N/A Level: 0.8 N/A N/A Debridement A (sq cm): rea Curette N/A N/A Instrument: Minimum N/A N/A Bleeding: Pressure N/A N/A Hemostasis A chieved: 0 N/A N/A Procedural Pain: 0 N/A N/A Post Procedural Pain: Procedure was tolerated well N/A  N/A Debridement Treatment Response: 0.8x1x0.1 N/A N/A Post Debridement Measurements L x W x [name] (cm) 0.063 N/A N/A Post Debridement Volume: (cm) Scarring: Yes N/A N/A Periwound Skin Texture: Excoriation: No Induration: No Callus: No Crepitus: No Rash: No Maceration: No N/A N/A Periwound Skin Moisture: Dry/Scaly: No Atrophie Blanche: No N/A N/A Periwound Skin Color: Cyanosis: No Ecchymosis: No Erythema: No Hemosiderin Staining: No Mottled: No Pallor: No Rubor: No No Abnormality N/A N/A Temperature: Yes N/A N/A Tenderness on Palpation: Debridement N/A N/A Procedures Performed: Treatment Notes Wound #9 (Groin) Wound Laterality: Left Cleanser Soap and Water Discharge Instruction: May shower and wash wound with dial antibacterial soap and water prior to dressing change. Peri-Wound Care Topical Primary Dressing KerraCel Ag Gelling Fiber Dressing, 4x5 in (silver alginate) Discharge Instruction: Apply silver alginate to wound bed as instructed AIANNA, FA[facility] ([id number]) [id number] Page 4 of 8 Secondary Dressing Woven Gauze Sponge, Non-Sterile 4x4 in Discharge Instruction: Apply over primary dressing as directed. Secured With 45M Medipore H Soft Cloth Surgical T ape, 4 x 10 (in/yd) Discharge Instruction: Secure with tape as directed. Compression Wrap Compression Stockings Add-Ons Electronic Signature(s) Signed: 04/03/2022 4:29:33 PM By: Fredirick Maudlin MD FACS Entered By: Fredirick Maudlin on 04/03/2022 16:29:33 -------------------------------------------------------------------------------- Multi-Disciplinary Care Plan Details Patient Name: Date of Service: Megan Session [name]. 04/03/2022 2:30 PM Medical Record Number: [id number] Patient Account Number: [id number] Date of Birth/Sex: Treating RN: 1970/09/26 (51 y.o. [name] Primary Care Darian Cansler: [name] Other Clinician: Referring Mackinley Kiehn: Treating Kinta Martis/Extender:  Annice Needy in Treatment: 4 Multidisciplinary Care Plan reviewed with physician Active Inactive Pain, Acute or Chronic Nursing Diagnoses: Pain, acute or chronic: actual or potential Potential alteration in comfort, pain Goals: Patient will verbalize adequate pain control and receive pain control interventions during procedures as needed Date Initiated: 03/05/2022 Target Resolution Date: 06/27/2022 Goal Status: Active Patient/caregiver will verbalize comfort level met Date Initiated: 03/05/2022 Target Resolution Date: 06/27/2022 Goal Status: Active Interventions: Encourage patient to take pain medications as prescribed Provide education on pain management Treatment Activities: Administer pain control measures as ordered : 03/05/2022 Notes: Wound/Skin Impairment Nursing Diagnoses: Knowledge deficit related to ulceration/compromised skin integrity Goals: Patient/caregiver will verbalize understanding of skin care regimen Date Initiated: 03/05/2022 Target Resolution Date: 06/27/2022 Goal Status: Active Interventions: Assess patient/caregiver ability to obtain necessary supplies SHALIMAR, MCCLAIN [name] ([id number]) [id number] Page 5 of 8 Assess patient/caregiver ability to perform ulcer/skin care regimen upon admission and as needed Provide education on  ulcer and skin care Treatment Activities: Skin care regimen initiated : 03/05/2022 Topical wound management initiated : 03/05/2022 Notes: Electronic Signature(s) Signed: 04/03/2022 3:51:52 PM By: Dellie Catholic RN Entered By: Dellie Catholic on 04/03/2022 15:43:33 -------------------------------------------------------------------------------- Pain Assessment Details Patient Name: Date of Service: Megan Session [name]. 04/03/2022 2:30 PM Medical Record Number: [id number] Patient Account Number: [id number] Date of Birth/Sex: Treating RN: 1971/04/24 (51 y.o. F) Primary Care Sobia Karger:  [name] Other Clinician: Referring Oluwadamilola Rosamond: Treating Lindsi Bayliss/Extender: Annice Needy in Treatment: 4 Active Problems Location of Pain Severity and Description of Pain Patient Has Paino No Site Locations Pain Management and Medication Current Pain Management: Electronic Signature(s) Signed: 04/03/2022 4:14:15 PM By: Worthy Rancher Entered By: Worthy Rancher on 04/03/2022 14:52:49 -------------------------------------------------------------------------------- Patient/Caregiver Education Details Patient Name: Date of Service: Megan Session [name]. 12/6/2023andnbsp2:30 PM Medical Record Number: [id number] Patient Account Number: [id number] Date of Birth/Gender: Treating RN: Jun 04, 1970 (51 y.o. [name] [place], [place] [name] ([id number]) [id number].pdf Page 6 of 8 Primary Care Physician: [name] Other Clinician: Referring Physician: Treating Physician/Extender: Annice Needy in Treatment: 4 Education Assessment Education Provided To: Patient Education Topics Provided Wound/Skin Impairment: Methods: Explain/Verbal Responses: Return demonstration correctly Electronic Signature(s) Signed: 04/03/2022 3:51:52 PM By: Dellie Catholic RN Entered By: Dellie Catholic on 04/03/2022 15:44:09 -------------------------------------------------------------------------------- Wound Assessment Details Patient Name: Date of Service: Megan Session [name]. 04/03/2022 2:30 PM Medical Record Number: [id number] Patient Account Number: [id number] Date of Birth/Sex: Treating RN: 11-Mar-1971 (51 y.o. F) Primary Care Niylah Hassan: [name] Other Clinician: Referring Liam Cammarata: Treating Tranise Forrest/Extender: Annice Needy in Treatment: 4 Wound Status Wound Number: 9 Primary Abrasion Etiology: Wound Location: Left Groin Wound Open Wounding Event: Not Known Status: Date Acquired:  03/02/2022 Comorbid Lymphedema, Sleep Apnea, Congestive Heart Failure, Coronary Weeks Of Treatment: 4 History: Artery Disease, Hypertension, Type II Diabetes Clustered Wound: No Photos Wound Measurements Length: (cm) 0.8 Width: (cm) 1 Depth: (cm) 0.1 Area: (cm) 0.628 Volume: (cm) 0.063 % Reduction in Area: 44.5% % Reduction in Volume: 44.2% Epithelialization: Medium (34-66%) Tunneling: No Undermining: No Wound Description Classification: Full Thickness Without Exposed Support Structures Wound Margin: Distinct, outline attached Exudate Amount: Small Exudate Type: Serosanguineous Exudate Color: red, [name] DESTINY, HAGIN [name] ([id number]) Wound Bed Granulation Amount: Large (67-100%) Granulation Quality: Red, Pink Necrotic Amount: [name] Present (0%) Foul Odor After Cleansing: No Slough/Fibrino Yes [id number] Page 7 of 8 Exposed Structure Fascia Exposed: No Fat Layer (Subcutaneous Tissue) Exposed: Yes Tendon Exposed: No Muscle Exposed: No Joint Exposed: No Bone Exposed: No Periwound Skin Texture Texture Color No Abnormalities Noted: No No Abnormalities Noted: Yes Callus: No Temperature / Pain Crepitus: No Temperature: No Abnormality Excoriation: No Tenderness on Palpation: Yes Induration: No Rash: No Scarring: Yes Moisture No Abnormalities Noted: Yes Treatment Notes Wound #9 (Groin) Wound Laterality: Left Cleanser Soap and Water Discharge Instruction: May shower and wash wound with dial antibacterial soap and water prior to dressing change. Peri-Wound Care Topical Primary Dressing KerraCel Ag Gelling Fiber Dressing, 4x5 in (silver alginate) Discharge Instruction: Apply silver alginate to wound bed as instructed Secondary Dressing Woven Gauze Sponge, Non-Sterile 4x4 in Discharge Instruction: Apply over primary dressing as directed. Secured With 17M Medipore H Soft Cloth Surgical T ape, 4 x 10 (in/yd) Discharge Instruction: Secure  with tape as directed. Compression Wrap Compression Stockings Add-Ons Electronic Signature(s) Signed: 04/03/2022 3:51:52 PM By: Dellie Catholic RN Entered By: Dellie Catholic on 04/03/2022 15:07:44 -------------------------------------------------------------------------------- Vitals Details Patient Name: Date of Service: Dorita Sciara  HERBERT, AGUINALDO [name]. 04/03/2022 2:30 PM Medical Record Number: [id number] Patient Account Number: [id number] Date of Birth/Sex: Treating RN: Nov 30, 1970 (51 y.o. F) Primary Care Teandre Hamre: [name] Other Clinician: Referring Daveena Elmore: Treating Anamarie Hunn/Extender: Annice Needy in Treatment: 4 Vital Signs Time Taken: 02:49 Temperature (F): 97.7 CHRISTANA, ANGELICA [name] ([id number]) [id number] Page 8 of 8 Height (in): 69 Pulse (bpm): 88 Weight (lbs): 300 Respiratory Rate (breaths/min): 20 Body Mass Index (BMI): 44.3 Blood Pressure (mmHg): 131/91 Capillary Blood Glucose (mg/dl): 180 Reference Range: 80 - 120 mg / dl Electronic Signature(s) Signed: 04/03/2022 4:14:15 PM By: Worthy Rancher Entered By: Worthy Rancher on 04/03/2022 14:52:40

## 2022-04-04 NOTE — Progress Notes (Signed)
[name] ([id number]) [id number] Page 1 of 10 Visit Report for 04/03/2022 Chief Complaint Document Details Patient Name: Date of Service: [name], [name]. 04/03/2022 2:30 PM Medical Record Number: [id number] Patient Account Number: [id number] Date of Birth/Sex: Treating RN: 12-28-1970 (51 y.o. F) Primary Care Provider: [name] Other Clinician: Referring Provider: Treating Provider/Extender: Annice Needy in Treatment: 4 Information Obtained from: Patient Chief Complaint 07/23/2021; Left lower extremity wound 03/05/2022: Returns with a wound in the same location on her left lower extremity, as well as 1 in her left groin. Electronic Signature(s) Signed: 04/03/2022 4:29:41 PM By: Fredirick Maudlin MD FACS Entered By: Fredirick Maudlin on 04/03/2022 16:29:40 -------------------------------------------------------------------------------- Debridement Details Patient Name: Date of Service: [name], [name]. 04/03/2022 2:30 PM Medical Record Number: [id number] Patient Account Number: [id number] Date of Birth/Sex: Treating RN: 1971/03/21 (51 y.o. America Brown Primary Care Provider: [name] Other Clinician: Referring Provider: Treating Provider/Extender: Annice Needy in Treatment: 4 Debridement Performed for Assessment: Wound #9 Left Groin Performed By: Physician Fredirick Maudlin, MD Debridement Type: Debridement Level of Consciousness (Pre-procedure): Awake and Alert Pre-procedure Verification/Time Out Yes - 15:10 Taken: Start Time: 15:10 Pain Control: Lidocaine 4% T opical Solution T Area Debrided (L x W): otal 0.8 (cm) x 1 (cm) = 0.8 (cm) Tissue and other material debrided: [place] Level: Non-Viable Tissue Debridement Description: Selective/Open Wound Instrument: Curette Bleeding: Minimum Hemostasis Achieved: Pressure End Time: 15:11 Procedural Pain:  0 Post Procedural Pain: 0 Response to Treatment: Procedure was tolerated well Level of Consciousness (Post- Awake and Alert procedure): Post Debridement Measurements of Total Wound Length: (cm) 0.8 Width: (cm) 1 Depth: (cm) 0.1 Volume: (cm) 0.063 Character of Wound/Ulcer Post Debridement: Improved [name], [name] ([id number]) [id number] Page 2 of 10 Post Procedure Diagnosis Same as Pre-procedure Notes Scribed for Dr. Celine Ahr by J.Scotton Electronic Signature(s) Signed: 04/03/2022 3:51:52 PM By: Dellie Catholic RN Signed: 04/03/2022 5:07:18 PM By: Fredirick Maudlin MD FACS Entered By: Dellie Catholic on 04/03/2022 15:13:08 -------------------------------------------------------------------------------- HPI Details Patient Name: Date of Service: [name], [name]. 04/03/2022 2:30 PM Medical Record Number: [id number] Patient Account Number: [id number] Date of Birth/Sex: Treating RN: November 18, 1970 (51 y.o. F) Primary Care Provider: [name] Other Clinician: Referring Provider: Treating Provider/Extender: Annice Needy in Treatment: 4 History of Present Illness HPI Description: Admission 6/24 [name] is a 51 year old female with a past medical history of chronic venous insufficiency, lymphedema, DVT on anticoagulation and diastolic heart failure that presents to the clinic for left lower extremity wound. She was last seen 4 months ago in our clinic for the same issue. The reoccurring wound started at the end of May and has been going on for 1 month. She has been using an ointment and I am unclear what this is. She tries to keep her leg elevated with her compression stocking. She also reports she has lymphedema pumps and has been using them as well. She reports mild pain to the area. She denies signs of infection including increased warmth, erythema or purulent drainage. 7/1; patient presents for 1 week follow-up.  She has tolerated the wrap well. Unfortunately she did have trouble with the wrap Sliding down her leg 2 days ago. She has no issues or complaints today. She denies signs of infection. 7/15; patient presents for follow-up. She has tolerated the wrap well. She has no issues or complaints today. She denies signs of infection. 7/25; patient presents for 1 week follow-up. She  has no issues or complaints today. She denies signs of infection. 8/4; patient presents for follow-up. She has tolerated the compression wrap well to her left lower extremity. She states she was in the hospital last week due to fluid overload. She subsequently developed blisters to her bottom from being in the hospital bed for prolonged periods of time. These have since ruptured. She now has 2 areas of skin breakdown. She has been keeping gauze on them. She denies signs of infection. 8/18; patient presents for 2-week follow-up. She reports improvement to her buttocks wounds. She is able to tolerate the wrap well to her left lower extremity with no issues. She denies signs of infection. 9/1; patient presents for 2-week follow-up. She has no issues or complaints today. she denies signs of infection. 9/15; patient presents for 2-week follow-up. She has no issues or complaints today. She has tolerated the wrap well. She denies signs of infection. 9/27; patient presents for 1 week follow-up. She has no issues or complaints today. 10/20; patient presents for follow-up. She was on vacation for the past 3 weeks. She states she had the last clinic visit wrap in place for 1 week and then she took this off. She states that she has been using her compression wraps since. She states that the wound has gotten larger. She reports pain to the wound site. She denies fever/chills, nausea/vomiting. She denies purulent drainage. 10/24; patient presents for follow-up. She states she tolerated the Kerlix/Coban wrap well. She reports decrease in pain. She  states she started Keflex. She denies systemic signs of infection. 10/28; patient presents for follow-up. She has no issues or complaints today. She denies acute pain. She tolerated the wrap well. 11/4; patient presents for follow-up. She has no issues or complaints. She denies signs of infection. 11/11; patient presents for follow-up. She has no issues or complaints today. She denies signs of infection. She tolerated the wrap well. 11/18; patient presents for follow-up. She states she took a shower however despite a plastic bag the wrap got wet. She currently denies signs of infection. 12/2; patient presents for follow-up. She tolerated the wrap well. She has no issues or complaints today. 12/12; patient presents for follow-up. She has no issues or complaints today. Readmission 07/23/2021 Ms. Jaylea Plourde is a 51 year old female with a past medical history of chronic venous insufficiency that presents to the clinic for a left lower extremity wound. She was seen previously in the clinic for the same wound and this was healed on 04/09/2021. Since then she has been wearing her compression stockings intermittently. She reports more swelling to her left lower extremity over the past month and she stopped wearing her compression stocking. Subsequently she developed a wound. Her Lasix has been increased and she has noticed an improvement in her swelling. She currently denies signs of infection. [name], [name] ([id number]) [id number] Page 3 of 10 4/6; patient presents for follow-up. She states that the wrap stayed in place for 2 days before it rolled down and she had to take it off. She has compression stockings however states she could not get these on due to the swelling. She denies signs of infection. 4/13; patient with chronic venous insufficiency that has been in this clinic previously. She has a small pair of open areas on the left lower leg in the middle  of previous scar tissue from I believe a previous wound. Most concerning today is how poor her edema control is above the sclerotic skin in her lower  extremity. This is nonpitting and compatible with lymphedema. She tells me that she was using a juxta lite stocking although it was old we are apparently replacing this. She does not seem aware of how to adjust the tension with this and I think she is going to require 30/40 mmHg equivalent compression She has her arterial studies tomorrow at 2:00 4/25; patient presents for follow-up. ABIs done on 4/14 showed a left ABI of 1.04 and TBI of 0.99. She tolerated the compression wrap well This week. She states she is going to obtain compression stockings today. She has no issues or complaints today. 5/2; patient presents for follow-up. She states her compression stockings are being delivered today. Her wound is closed today. READMISSION 03/05/2022 Despite having worn her juxta lite stockings, the patient reports that the area on her left medial lower leg reopened a couple of weeks ago. She says that she has been applying lotion and leaving it open to air to dry. She also had a new wound open in her left groin. She is not sure how this started, but says it may have been an ingrown hair or pimple. There is slough accumulation in both sites. The leg wound is fairly desiccated. 03/12/2022: After our visit last week, she presented to the emergency department very short of breath. She was thought to be having a CHF exacerbation but was also influenza A positive. She was admitted to [facility] for about a week. She was just discharged yesterday morning. Both wounds are smaller today. The leg wound has epithelialized substantially with just 2 open areas. Edema control is excellent. The groin wound has also contracted. There is a little slough on the surface. 03/26/2022: The wound on her leg is healed. The wound in her groin measured a little larger today and  there is some slough on the surface. 04/03/2022: The wound on her leg remains closed. She is wearing her compression stockings and her edema control is good. The wound in her groin is a little smaller this week with light slough accumulation. Electronic Signature(s) Signed: 04/03/2022 4:30:19 PM By: Fredirick Maudlin MD FACS Entered By: Fredirick Maudlin on 04/03/2022 16:30:19 -------------------------------------------------------------------------------- Physical Exam Details Patient Name: Date of Service: Megan Session [name]. 04/03/2022 2:30 PM Medical Record Number: [id number] Patient Account Number: [id number] Date of Birth/Sex: Treating RN: [birth year] (51 y.o. F) Primary Care Provider: [name] Other Clinician: Referring Provider: Treating Provider/Extender: Annice Needy in Treatment: 4 Constitutional . . . . No acute distress. Respiratory Normal work of breathing on room air. Notes 04/03/2022: The wound on her leg remains closed. She is wearing her compression stockings and her edema control is good. The wound in her groin is a little smaller this week with light slough accumulation. Electronic Signature(s) Signed: 04/03/2022 4:31:51 PM By: Fredirick Maudlin MD FACS Entered By: Fredirick Maudlin on 04/03/2022 16:31:51 -------------------------------------------------------------------------------- Physician Orders Details Patient Name: Date of Service: [name], Marcee [name]. 04/03/2022 2:30 PM Medical Record Number: [id number] Patient Account Number: [id number] [name], [name] ([id number]) [id number] Page 4 of 10 Date of Birth/Sex: Treating RN: 07-28-1970 (51 y.o. America Brown Primary Care Provider: [name] Other Clinician: Referring Provider: Treating Provider/Extender: Annice Needy in Treatment: 4 Verbal / Phone Orders: No Diagnosis Coding ICD-10 Coding Code  Description [phone] Non-pressure chronic ulcer of left thigh with fat layer exposed I50.30 Unspecified diastolic (congestive) heart failure I87.2 Venous insufficiency (chronic) (peripheral) I89.0 Lymphedema, not elsewhere classified E66.01 Morbid (severe)  obesity due to excess calories Follow-up Appointments ppointment in 2 weeks. - Dr. Celine Ahr Room 3 Return A Anesthetic Wound #9 Left Groin (In clinic) Topical Lidocaine 4% applied to wound bed Bathing/ Shower/ Hygiene May shower and wash wound with soap and water. Edema Control - Lymphedema / SCD / Other Elevate legs to the level of the heart or above for 30 minutes daily and/or when sitting, a frequency of: - 3-4 times a day throughout the day. Avoid standing for long periods of time. Exercise regularly Moisturize legs daily. - nightly after removing stockings Compression stocking or Garment 20-30 mm/Hg pressure to: - may be purchased at [employer] [facility], [facility] or [facility] Wound #9 - Groin Wound Laterality: Left Cleanser: Soap and Water 1 x Per Day/30 Days Discharge Instructions: May shower and wash wound with dial antibacterial soap and water prior to dressing change. Prim Dressing: KerraCel Ag Gelling Fiber Dressing, 4x5 in (silver alginate) 1 x Per Day/30 Days ary Discharge Instructions: Apply silver alginate to wound bed as instructed Secondary Dressing: Woven Gauze Sponge, Non-Sterile 4x4 in 1 x Per Day/30 Days Discharge Instructions: Apply over primary dressing as directed. Secured With: 70M Medipore H Soft Cloth Surgical T ape, 4 x 10 (in/yd) 1 x Per Day/30 Days Discharge Instructions: Secure with tape as directed. Electronic Signature(s) Signed: 04/03/2022 5:07:18 PM By: Fredirick Maudlin MD FACS Previous Signature: 04/03/2022 3:51:52 PM Version By: Dellie Catholic RN Entered By: Fredirick Maudlin on 04/03/2022  16:32:06 -------------------------------------------------------------------------------- Problem List Details Patient Name: Date of Service: Megan Session [name]. 04/03/2022 2:30 PM Medical Record Number: [id number] Patient Account Number: [id number] Date of Birth/Sex: Treating RN: Nov 16, 1970 (51 y.o. F) Primary Care Provider: [name] Other Clinician: Referring Provider: Treating Provider/Extender: Annice Needy in Treatment: [address] ANTONIETTE, [name] ([id number]) [id number] Page 5 of 10 ICD-10 Encounter Code Description Active Date MDM Diagnosis L97.122 Non-pressure chronic ulcer of left thigh with fat layer exposed 03/05/2022 No Yes I50.30 Unspecified diastolic (congestive) heart failure 03/05/2022 No Yes I87.2 Venous insufficiency (chronic) (peripheral) 03/05/2022 No Yes I89.0 Lymphedema, not elsewhere classified 03/05/2022 No Yes E66.01 Morbid (severe) obesity due to excess calories 03/05/2022 No Yes Inactive Problems Resolved Problems ICD-10 Code Description Active Date Resolved Date L97.822 Non-pressure chronic ulcer of other part of left lower leg with fat layer exposed 03/05/2022 03/05/2022 Electronic Signature(s) Signed: 04/03/2022 4:28:54 PM By: Fredirick Maudlin MD FACS Entered By: Fredirick Maudlin on 04/03/2022 16:28:54 -------------------------------------------------------------------------------- Progress Note Details Patient Name: Date of Service: [name], Jolyn [name]. 04/03/2022 2:30 PM Medical Record Number: [id number] Patient Account Number: [id number] Date of Birth/Sex: Treating RN: 1970/10/15 (51 y.o. F) Primary Care Provider: [name] Other Clinician: Referring Provider: Treating Provider/Extender: Annice Needy in Treatment: 4 Subjective Chief Complaint Information obtained from Patient 07/23/2021; Left lower extremity wound 03/05/2022: Returns with a wound  in the same location on her left lower extremity, as well as 1 in her left groin. History of Present Illness (HPI) Admission 6/24 Ms. Lakara Weiland is a 51 year old female with a past medical history of chronic venous insufficiency, lymphedema, DVT on anticoagulation and diastolic heart failure that presents to the clinic for left lower extremity wound. She was last seen 4 months ago in our clinic for the same issue. The reoccurring wound started at the end of May and has been going on for 1 month. She has been using an ointment and I am unclear what this is. She tries to keep  her leg elevated with her compression stocking. She also reports she has lymphedema pumps and has been using them as well. She reports mild pain to the area. She denies signs of infection including increased warmth, erythema or purulent drainage. 7/1; patient presents for 1 week follow-up. She has tolerated the wrap well. Unfortunately she did have trouble with the wrap Sliding down her leg 2 days ago. She has no issues or complaints today. She denies signs of infection. 7/15; patient presents for follow-up. She has tolerated the wrap well. She has no issues or complaints today. She denies signs of infection. 7/25; patient presents for 1 week follow-up. She has no issues or complaints today. She denies signs of infection. 8/4; patient presents for follow-up. She has tolerated the compression wrap well to her left lower extremity. She states she was in the hospital last week due to KEILANY, BURNETTE [name] ([id number]) [id number] Page 6 of 10 fluid overload. She subsequently developed blisters to her bottom from being in the hospital bed for prolonged periods of time. These have since ruptured. She now has 2 areas of skin breakdown. She has been keeping gauze on them. She denies signs of infection. 8/18; patient presents for 2-week follow-up. She reports improvement to her buttocks wounds. She is able to  tolerate the wrap well to her left lower extremity with no issues. She denies signs of infection. 9/1; patient presents for 2-week follow-up. She has no issues or complaints today. she denies signs of infection. 9/15; patient presents for 2-week follow-up. She has no issues or complaints today. She has tolerated the wrap well. She denies signs of infection. 9/27; patient presents for 1 week follow-up. She has no issues or complaints today. 10/20; patient presents for follow-up. She was on vacation for the past 3 weeks. She states she had the last clinic visit wrap in place for 1 week and then she took this off. She states that she has been using her compression wraps since. She states that the wound has gotten larger. She reports pain to the wound site. She denies fever/chills, nausea/vomiting. She denies purulent drainage. 10/24; patient presents for follow-up. She states she tolerated the Kerlix/Coban wrap well. She reports decrease in pain. She states she started Keflex. She denies systemic signs of infection. 10/28; patient presents for follow-up. She has no issues or complaints today. She denies acute pain. She tolerated the wrap well. 11/4; patient presents for follow-up. She has no issues or complaints. She denies signs of infection. 11/11; patient presents for follow-up. She has no issues or complaints today. She denies signs of infection. She tolerated the wrap well. 11/18; patient presents for follow-up. She states she took a shower however despite a plastic bag the wrap got wet. She currently denies signs of infection. 12/2; patient presents for follow-up. She tolerated the wrap well. She has no issues or complaints today. 12/12; patient presents for follow-up. She has no issues or complaints today. Readmission 07/23/2021 Ms. Archita Lomeli is a 51 year old female with a past medical history of chronic venous insufficiency that presents to the clinic for a left lower extremity wound. She  was seen previously in the clinic for the same wound and this was healed on 04/09/2021. Since then she has been wearing her compression stockings intermittently. She reports more swelling to her left lower extremity over the past month and she stopped wearing her compression stocking. Subsequently she developed a wound. Her Lasix has been increased and she has noticed an improvement in her  swelling. She currently denies signs of infection. 4/6; patient presents for follow-up. She states that the wrap stayed in place for 2 days before it rolled down and she had to take it off. She has compression stockings however states she could not get these on due to the swelling. She denies signs of infection. 4/13; patient with chronic venous insufficiency that has been in this clinic previously. She has a small pair of open areas on the left lower leg in the middle of previous scar tissue from I believe a previous wound. Most concerning today is how poor her edema control is above the sclerotic skin in her lower extremity. This is nonpitting and compatible with lymphedema. She tells me that she was using a juxta lite stocking although it was old we are apparently replacing this. She does not seem aware of how to adjust the tension with this and I think she is going to require 30/40 mmHg equivalent compression She has her arterial studies tomorrow at 2:00 4/25; patient presents for follow-up. ABIs done on 4/14 showed a left ABI of 1.04 and TBI of 0.99. She tolerated the compression wrap well This week. She states she is going to obtain compression stockings today. She has no issues or complaints today. 5/2; patient presents for follow-up. She states her compression stockings are being delivered today. Her wound is closed today. READMISSION 03/05/2022 Despite having worn her juxta lite stockings, the patient reports that the area on her left medial lower leg reopened a couple of weeks ago. She says that she has  been applying lotion and leaving it open to air to dry. She also had a new wound open in her left groin. She is not sure how this started, but says it may have been an ingrown hair or pimple. There is slough accumulation in both sites. The leg wound is fairly desiccated. 03/12/2022: After our visit last week, she presented to the emergency department very short of breath. She was thought to be having a CHF exacerbation but was also influenza A positive. She was admitted to [facility] for about a week. She was just discharged yesterday morning. Both wounds are smaller today. The leg wound has epithelialized substantially with just 2 open areas. Edema control is excellent. The groin wound has also contracted. There is a little slough on the surface. 03/26/2022: The wound on her leg is healed. The wound in her groin measured a little larger today and there is some slough on the surface. 04/03/2022: The wound on her leg remains closed. She is wearing her compression stockings and her edema control is good. The wound in her groin is a little smaller this week with light slough accumulation. Patient History Information obtained from Patient. Family History Diabetes - Mother,Maternal Grandparents, Heart Disease - Paternal Grandparents, Hypertension - Mother,Maternal Grandparents, Lung Disease - Father,Paternal Grandparents, No family history of Cancer, Hereditary Spherocytosis, Kidney Disease, Seizures, Stroke, Thyroid Problems, Tuberculosis. Social History Former smoker, Marital Status - Single, Alcohol Use - Never, Drug Use - No History, Caffeine Use - Daily. Medical History Eyes Denies history of Cataracts, Glaucoma, Optic Neuritis Ear/Nose/Mouth/Throat Denies history of Chronic sinus problems/congestion, Middle ear problems Hematologic/Lymphatic Patient has history of Lymphedema Denies history of Anemia, Hemophilia, Human Immunodeficiency Virus, Sickle Cell Disease Respiratory Patient  has history of Sleep Apnea Denies history of Aspiration, Asthma, Chronic Obstructive Pulmonary Disease (COPD), Pneumothorax, Tuberculosis ARVIE, VILLARRUEL [name] ([id number]) [id number] Page 7 of 10 Cardiovascular Patient has history of Congestive Heart Failure, Coronary  Artery Disease, Hypertension Denies history of Angina, Arrhythmia, Deep Vein Thrombosis, Hypotension, Myocardial Infarction, Peripheral Arterial Disease, Peripheral Venous Disease, Phlebitis, Vasculitis Gastrointestinal Denies history of Cirrhosis , Colitis, Crohnoos, Hepatitis A, Hepatitis B, Hepatitis C Endocrine Patient has history of Type II Diabetes Denies history of Type I Diabetes Genitourinary Denies history of End Stage Renal Disease Immunological Denies history of Lupus Erythematosus, Raynaudoos, Scleroderma Integumentary (Skin) Denies history of History of Burn Musculoskeletal Denies history of Gout, Rheumatoid Arthritis, Osteoarthritis, Osteomyelitis Neurologic Denies history of Dementia, Neuropathy, Quadriplegia, Paraplegia, Seizure Disorder Oncologic Denies history of Received Chemotherapy, Received Radiation Psychiatric Denies history of Anorexia/bulimia, Confinement Anxiety Objective Constitutional No acute distress. Vitals Time Taken: 2:49 AM, Height: 69 in, Weight: 300 lbs, BMI: 44.3, Temperature: 97.7 F, Pulse: 88 bpm, Respiratory Rate: 20 breaths/min, Blood Pressure: 131/91 mmHg, Capillary Blood Glucose: 180 mg/dl. Respiratory Normal work of breathing on room air. General Notes: 04/03/2022: The wound on her leg remains closed. She is wearing her compression stockings and her edema control is good. The wound in her groin is a little smaller this week with light slough accumulation. Integumentary (Hair, Skin) Wound #9 status is Open. Original cause of wound was Not Known. The date acquired was: 03/02/2022. The wound has been in treatment 4 weeks. The wound is located on  the Left Groin. The wound measures 0.8cm length x 1cm width x 0.1cm depth; 0.628cm^2 area and 0.063cm^3 volume. There is Fat Layer (Subcutaneous Tissue) exposed. There is no tunneling or undermining noted. There is a small amount of serosanguineous drainage noted. The wound margin is distinct with the outline attached to the wound base. There is large (67-100%) red, pink granulation within the wound bed. There is no necrotic tissue within the wound bed. The periwound skin appearance had no abnormalities noted for moisture. The periwound skin appearance had no abnormalities noted for color. The periwound skin appearance exhibited: Scarring. The periwound skin appearance did not exhibit: Callus, Crepitus, Excoriation, Induration, Rash. Periwound temperature was noted as No Abnormality. The periwound has tenderness on palpation. Assessment Active Problems ICD-10 Non-pressure chronic ulcer of left thigh with fat layer exposed Unspecified diastolic (congestive) heart failure Venous insufficiency (chronic) (peripheral) Lymphedema, not elsewhere classified Morbid (severe) obesity due to excess calories Procedures Wound #9 Pre-procedure diagnosis of Wound #9 is an Abrasion located on the Left Groin . There was a Selective/Open Wound Non-Viable Tissue Debridement with a total area of 0.8 sq cm performed by Fredirick Maudlin, MD. With the following instrument(s): Curette Material removed includes [facility] after achieving pain control using Lidocaine 4% T opical Solution. No specimens were taken. A time out was conducted at 15:10, prior to the start of the procedure. A Minimum amount of bleeding was controlled with Pressure. The procedure was tolerated well with a pain level of 0 throughout and a pain level of 0 following the procedure. Post Debridement Measurements: 0.8cm length x 1cm width x 0.1cm depth; 0.063cm^3 volume. Character of Wound/Ulcer Post Debridement is improved. Post procedure Diagnosis Wound  #9: Same as Pre-Procedure [name], [name] ([id number]) [id number] Page 8 of 10 General Notes: Scribed for Dr. Celine Ahr by J.Scotton. Plan Follow-up Appointments: Return Appointment in 2 weeks. - Dr. Celine Ahr Room 3 Anesthetic: Wound #9 Left Groin: (In clinic) Topical Lidocaine 4% applied to wound bed Bathing/ Shower/ Hygiene: May shower and wash wound with soap and water. Edema Control - Lymphedema / SCD / Other: Elevate legs to the level of the heart or above for 30 minutes daily and/or when sitting, a  frequency of: - 3-4 times a day throughout the day. Avoid standing for long periods of time. Exercise regularly Moisturize legs daily. - nightly after removing stockings Compression stocking or Garment 20-30 mm/Hg pressure to: - may be purchased at [employer] [facility], [facility] or [facility]: - Groin Wound Laterality: Left Cleanser: Soap and Water 1 x Per Day/30 Days Discharge Instructions: May shower and wash wound with dial antibacterial soap and water prior to dressing change. Prim Dressing: KerraCel Ag Gelling Fiber Dressing, 4x5 in (silver alginate) 1 x Per Day/30 Days ary Discharge Instructions: Apply silver alginate to wound bed as instructed Secondary Dressing: Woven Gauze Sponge, Non-Sterile 4x4 in 1 x Per Day/30 Days Discharge Instructions: Apply over primary dressing as directed. Secured With: 67M Medipore H Soft Cloth Surgical T ape, 4 x 10 (in/yd) 1 x Per Day/30 Days Discharge Instructions: Secure with tape as directed. 04/03/2022: The wound on her leg remains closed. She is wearing her compression stockings and her edema control is good. The wound in her groin is a little smaller this week with light slough accumulation. I used a curette to debride the slough from her wound. We will continue silver alginate dressings. Follow-up in 1 week. Electronic Signature(s) Signed: 04/03/2022 4:33:52 PM By: Fredirick Maudlin MD FACS Entered By: Fredirick Maudlin on 04/03/2022 16:33:52 -------------------------------------------------------------------------------- HxROS Details Patient Name: Date of Service: [name], Elizaveta [name]. 04/03/2022 2:30 PM Medical Record Number: [id number] Patient Account Number: [id number] Date of Birth/Sex: Treating RN: [birth year] (51 y.o. F) Primary Care Provider: [name] Other Clinician: Referring Provider: Treating Provider/Extender: Annice Needy in Treatment: 4 Information Obtained From Patient Eyes Medical History: Negative for: Cataracts; Glaucoma; Optic Neuritis Ear/Nose/Mouth/Throat Medical History: Negative for: Chronic sinus problems/congestion; Middle ear problems Hematologic/Lymphatic Medical History: Positive for: Lymphedema Negative for: Anemia; Hemophilia; Human Immunodeficiency Virus; Sickle Cell Disease [name], [name] ([id number]) [id number] Page 9 of 10 Respiratory Medical History: Positive for: Sleep Apnea Negative for: Aspiration; Asthma; Chronic Obstructive Pulmonary Disease (COPD); Pneumothorax; Tuberculosis Cardiovascular Medical History: Positive for: Congestive Heart Failure; Coronary Artery Disease; Hypertension Negative for: Angina; Arrhythmia; Deep Vein Thrombosis; Hypotension; Myocardial Infarction; Peripheral Arterial Disease; Peripheral Venous Disease; Phlebitis; Vasculitis Gastrointestinal Medical History: Negative for: Cirrhosis ; Colitis; Crohns; Hepatitis A; Hepatitis B; Hepatitis C Endocrine Medical History: Positive for: Type II Diabetes Negative for: Type I Diabetes Time with diabetes: 2002 Treated with: Oral agents, Diet Blood sugar tested every day: Yes Tested : TID Genitourinary Medical History: Negative for: End Stage Renal Disease Immunological Medical History: Negative for: Lupus Erythematosus; Raynauds; Scleroderma Integumentary (Skin) Medical History: Negative for: History of  Burn Musculoskeletal Medical History: Negative for: Gout; Rheumatoid Arthritis; Osteoarthritis; Osteomyelitis Neurologic Medical History: Negative for: Dementia; Neuropathy; Quadriplegia; Paraplegia; Seizure Disorder Oncologic Medical History: Negative for: Received Chemotherapy; Received Radiation Psychiatric Medical History: Negative for: Anorexia/bulimia; Confinement Anxiety Immunizations Pneumococcal Vaccine: Received Pneumococcal Vaccination: No Implantable Devices None Family and Social History Cancer: No; Diabetes: Yes - Mother,Maternal Grandparents; Heart Disease: Yes - Paternal Grandparents; Hereditary Spherocytosis: No; Hypertension: Yes - Mother,Maternal Grandparents; Kidney Disease: No; Lung Disease: Yes - Father,Paternal Grandparents; Seizures: No; Stroke: No; Thyroid Problems: No; Tuberculosis: No; Former smoker; Marital Status - Single; Alcohol Use: Never; Drug Use: No History; Caffeine Use: Daily; Financial Concerns: No; Food, Clothing or Shelter Needs: No; Support System Lacking: No; Transportation Concerns: No Electronic Signature(s) LASHELL, MOFFITT [name] ([id number]) [id number] Page 10 of 10 Signed: 04/03/2022 5:07:18 PM By: Fredirick Maudlin MD FACS Entered By: Fredirick Maudlin on  04/03/2022 16:30:26 -------------------------------------------------------------------------------- SuperBill Details Patient Name: Date of Service: PATRISHA, HAUSMANN 04/03/2022 Medical Record Number: [id number] Patient Account Number: [id number] Date of Birth/Sex: Treating RN: 1970-05-24 (51 y.o. F) Primary Care Provider: [name] Other Clinician: Referring Provider: Treating Provider/Extender: Annice Needy in Treatment: 4 Diagnosis Coding ICD-10 Codes Code Description [phone] Non-pressure chronic ulcer of left thigh with fat layer exposed I50.30 Unspecified diastolic (congestive) heart failure I87.2 Venous  insufficiency (chronic) (peripheral) I89.0 Lymphedema, not elsewhere classified E66.01 Morbid (severe) obesity due to excess calories Facility Procedures : CPT4 Code: 24235361 Description: 44315 - DEBRIDE WOUND 1ST 20 SQ CM OR < ICD-10 Diagnosis Description L97.122 Non-pressure chronic ulcer of left thigh with fat layer exposed Modifier: Quantity: 1 Physician Procedures : CPT4 Code Description Modifier [id number] - WC PHYS LEVEL 3 - EST PT 25 ICD-10 Diagnosis Description L97.122 Non-pressure chronic ulcer of left thigh with fat layer exposed I50.30 Unspecified diastolic (congestive) heart failure I87.2 Venous  insufficiency (chronic) (peripheral) I89.0 Lymphedema, not elsewhere classified Quantity: 1 : [id number] - WC PHYS DEBR WO ANESTH 20 SQ CM ICD-10 Diagnosis Description I33.825 Non-pressure chronic ulcer of left thigh with fat layer exposed Quantity: 1 Electronic Signature(s) Signed: 04/03/2022 4:35:22 PM By: Fredirick Maudlin MD FACS Entered By: Fredirick Maudlin on 04/03/2022 16:35:22

## 2022-04-08 ENCOUNTER — Telehealth: Payer: Self-pay | Admitting: Internal Medicine

## 2022-04-08 ENCOUNTER — Encounter: Payer: Self-pay | Admitting: Internal Medicine

## 2022-04-08 DIAGNOSIS — E1169 Type 2 diabetes mellitus with other specified complication: Secondary | ICD-10-CM

## 2022-04-08 NOTE — Telephone Encounter (Signed)
Caller requesting another updated referral for [facility]   Phone [phone]

## 2022-04-08 NOTE — Telephone Encounter (Signed)
Nutrition referral submitted as requested by patient.  Pt sent Mychart message.

## 2022-04-08 NOTE — Telephone Encounter (Signed)
Routing to PCP for review.

## 2022-04-15 DIAGNOSIS — G4733 Obstructive sleep apnea (adult) (pediatric): Secondary | ICD-10-CM | POA: Diagnosis not present

## 2022-04-17 ENCOUNTER — Encounter (HOSPITAL_BASED_OUTPATIENT_CLINIC_OR_DEPARTMENT_OTHER): Payer: Medicaid Other | Admitting: General Surgery

## 2022-04-17 ENCOUNTER — Ambulatory Visit: Payer: Self-pay | Admitting: *Deleted

## 2022-04-17 DIAGNOSIS — L97122 Non-pressure chronic ulcer of left thigh with fat layer exposed: Secondary | ICD-10-CM | POA: Diagnosis not present

## 2022-04-17 DIAGNOSIS — S30811A Abrasion of abdominal wall, initial encounter: Secondary | ICD-10-CM | POA: Diagnosis not present

## 2022-04-17 NOTE — Telephone Encounter (Signed)
  Chief Complaint: rectal bleeding and and vaginal bleeding Symptoms: constipation reported with blood noted mixed in stool. Staining reported with BMs. Abdominal bloating and pain comes and goes. Hx taking eliquis. Reports red blood with wiping . No clots reported on in toilet water. Eating fiber and beets and iron foods per patient. Reports she feels bleeding from rectum and vagina. Frequency: 3-4 days and bleeding comes and goes x 2 weeks  Pertinent Negatives: Patient denies dizziness, no active bleeding reported. No c/o lightheadedness. No N/V Disposition: [x] ED /[] Urgent Care (no appt availability in office) / [] Appointment(In office/virtual)/ []  [facility]/ [] Home Care/ [] Refused Recommended Disposition /[] [facility]/ []  Follow-up with PCP Additional Notes:   Please advise if patient needs to go to ED due to hx taking eliquis. No active rectal bleeding. Please advise. Patient recommended to monitor if bleeding continues go to ED. Please advise     Reason for Disposition  Taking Coumadin (warfarin) or other strong blood thinner, or known bleeding disorder (e.g., thrombocytopenia)  Answer Assessment - Initial Assessment Questions 1. APPEARANCE of BLOOD: "What color is it?" "Is it passed separately, on the surface of the stool, or mixed in with the stool?"      Mixed in stool red in color  2. AMOUNT: "How much blood was passed?"      Noticeable  3. FREQUENCY: "How many times has blood been passed with the stools?"      X 3-4 days not every BM  4. ONSET: "When was the blood first seen in the stools?" (Days or weeks)      3- 4 days ago constipation x 2 weeks bleeding noted  5. DIARRHEA: "Is there also some diarrhea?" If Yes, ask: "How many diarrhea stools in the past 24 hours?"      na 6. CONSTIPATION: "Do you have constipation?" If Yes, ask: "How bad is it?"     Yes  7. RECURRENT SYMPTOMS: "Have you had blood in your stools before?" If Yes, ask: "When was the  last time?" and "What happened that time?"      Yes 8. BLOOD THINNERS: "Do you take any blood thinners?" (e.g., Coumadin/warfarin, Pradaxa/dabigatran, aspirin) Eliquis  9. OTHER SYMPTOMS: "Do you have any other symptoms?"  (e.g., abdomen pain, vomiting, dizziness, fever)     Abdomen feels bloated like "going to start period" 10. PREGNANCY: "Is there any chance you are pregnant?" "When was your last menstrual period?"       na  Protocols used: Rectal Bleeding-A-AH

## 2022-04-17 NOTE — Progress Notes (Signed)
ETTAMAE, BARKETT D ([id number]) [id number] Page 1 of 8 Visit Report for 04/17/2022 Arrival Information Details Patient Name: Date of Service: RAFEEF, LAU 04/17/2022 12:30 PM Medical Record Number: [id number] Patient Account Number: [id number] Date of Birth/Sex: Treating RN: 12/12/1970 (51 y.o. America Brown Primary Care Diasia Henken: [name] Other Clinician: Referring Graciana Sessa: Treating Evelisse Szalkowski/Extender: Annice Needy in Treatment: 6 Visit Information History Since Last Visit Added or deleted any medications: No Patient Arrived: Ambulatory Any new allergies or adverse reactions: No Arrival Time: 12:35 Had a fall or experienced change in No Accompanied By: self activities of daily living that may affect Transfer Assistance: Manual risk of falls: Patient Requires Transmission-Based Precautions: No Signs or symptoms of abuse/neglect since last visito No Patient Has Alerts: Yes Hospitalized since last visit: No Patient Alerts: Patient on Blood Thinner Implantable device outside of the clinic excluding No ABI R 0.99 08/10/2021 cellular tissue based products placed in the center ABI L 1.04 08/10/2021 since last visit: Has Dressing in Place as Prescribed: Yes Pain Present Now: No Notes On Eliquis Electronic Signature(s) Signed: 04/17/2022 3:00:42 PM By: Dellie Catholic RN Entered By: Dellie Catholic on 04/17/2022 12:41:04 -------------------------------------------------------------------------------- Encounter Discharge Information Details Patient Name: Date of Service: [name] 04/17/2022 12:30 PM Medical Record Number: [id number] Patient Account Number: [id number] Date of Birth/Sex: Treating RN: [birth year] (51 y.o. America Brown Primary Care Annalynn Centanni: [name] Other Clinician: Referring Kaili Castille: Treating Raffi Milstein/Extender: Annice Needy in Treatment:  6 Encounter Discharge Information Items Post Procedure Vitals Discharge Condition: Stable Temperature (F): 97.9 Ambulatory Status: Ambulatory Pulse (bpm): 101 Discharge Destination: Home Respiratory Rate (breaths/min): 18 Transportation: Private Auto Blood Pressure (mmHg): 122/83 Accompanied By: self Schedule Follow-up Appointment: Yes Clinical Summary of Care: Patient Declined Electronic Signature(s) Signed: 04/17/2022 3:00:42 PM By: Dellie Catholic RN Entered By: Dellie Catholic on 04/17/2022 14:59:28 [name] ([id number]) [id number] Page 2 of 8 -------------------------------------------------------------------------------- Lower Extremity Assessment Details Patient Name: Date of Service: HOWARD, PATTON D. 04/17/2022 12:30 PM Medical Record Number: [id number] Patient Account Number: [id number] Date of Birth/Sex: Treating RN: 11-Oct-1970 (51 y.o. America Brown Primary Care Elica Almas: [name] Other Clinician: Referring Ecko Beasley: Treating Kendall Justo/Extender: Annice Needy in Treatment: 6 Edema Assessment Assessed: [Left: No] [Right: No] Edema: [Left: Ye] [Right: s] Calf Left: Right: Point of Measurement: 28 cm From Medial Instep 40.5 cm Ankle Left: Right: Point of Measurement: 12 cm From Medial Instep 23 cm Vascular Assessment Pulses: Dorsalis Pedis Palpable: [Left:Yes] Electronic Signature(s) Signed: 04/17/2022 3:00:42 PM By: Dellie Catholic RN Entered By: Dellie Catholic on 04/17/2022 12:41:40 -------------------------------------------------------------------------------- Multi Wound Chart Details Patient Name: Date of Service: [name], Nuria D. 04/17/2022 12:30 PM Medical Record Number: [id number] Patient Account Number: [id number] Date of Birth/Sex: Treating RN: [birth year] (51 y.o. F) Primary Care Melaya Hoselton: [name] Other Clinician: Referring Toussaint Golson: Treating  Braylen Staller/Extender: Annice Needy in Treatment: 6 Vital Signs Height(in): 69 Pulse(bpm): 101 Weight(lbs): 300 Blood Pressure(mmHg): 122/83 Body Mass Index(BMI): 44.3 Temperature(F): 97.9 Respiratory Rate(breaths/min): 18 [9:Photos:] [N/A:No Photos N/A [id number] Page 3 of 8] Left Groin Left Groin N/A Wound Location: Not Known Not Known N/A Wounding Event: Abrasion Abrasion N/A Primary Etiology: Lymphedema, Sleep Apnea, Lymphedema, Sleep Apnea, N/A Comorbid History: Congestive Heart Failure, Coronary Congestive Heart Failure, Coronary Artery Disease, Hypertension, Type II Artery Disease, Hypertension, Type II Diabetes Diabetes 03/02/2022 03/02/2022 N/A Date Acquired: 6 6 N/A Weeks of Treatment: Open Open N/A Wound Status: No No  N/A Wound Recurrence: 0.6x1x0.1 0.6x1x0.1 N/A Measurements L x W x D (cm) 0.471 0.471 N/A A (cm) : rea 0.047 0.047 N/A Volume (cm) : 58.40% 58.40% N/A % Reduction in A rea: 58.40% 58.40% N/A % Reduction in Volume: Full Thickness Without Exposed Full Thickness Without Exposed N/A Classification: Support Structures Support Structures Small Small N/A Exudate A mount: Serosanguineous Serosanguineous N/A Exudate Type: red, brown red, brown N/A Exudate Color: Distinct, outline attached Distinct, outline attached N/A Wound Margin: Large (67-100%) Large (67-100%) N/A Granulation A mount: Red, Pink Red, Pink N/A Granulation Quality: Small (1-33%) Small (1-33%) N/A Necrotic A mount: Fat Layer (Subcutaneous Tissue): Yes Fat Layer (Subcutaneous Tissue): Yes N/A Exposed Structures: Fascia: No Fascia: No Tendon: No Tendon: No Muscle: No Muscle: No Joint: No Joint: No Bone: No Bone: No Medium (34-66%) Medium (34-66%) N/A Epithelialization: Debridement - Selective/Open Wound Debridement - Selective/Open Wound N/A Debridement: Pre-procedure Verification/Time Out 12:41 12:41  N/A Taken: Lidocaine 5% topical ointment Lidocaine 5% topical ointment N/A Pain Control: [employer] N/A Tissue Debrided: Non-Viable Tissue Non-Viable Tissue N/A Level: 0.6 0.6 N/A Debridement A (sq cm): rea Curette Curette N/A Instrument: Minimum Minimum N/A Bleeding: Pressure Pressure N/A Hemostasis A chieved: 0 0 N/A Procedural Pain: 0 0 N/A Post Procedural Pain: Procedure was tolerated well Procedure was tolerated well N/A Debridement Treatment Response: 0.6x1x0.1 0.6x1x0.1 N/A Post Debridement Measurements L x W x D (cm) 0.047 0.047 N/A Post Debridement Volume: (cm) Scarring: Yes Scarring: Yes N/A Periwound Skin Texture: Excoriation: No Excoriation: No Induration: No Induration: No Callus: No Callus: No Crepitus: No Crepitus: No Rash: No Rash: No Maceration: No Maceration: No N/A Periwound Skin Moisture: Dry/Scaly: No Dry/Scaly: No Atrophie Blanche: No Atrophie Blanche: No N/A Periwound Skin Color: Cyanosis: No Cyanosis: No Ecchymosis: No Ecchymosis: No Erythema: No Erythema: No Hemosiderin Staining: No Hemosiderin Staining: No Mottled: No Mottled: No Pallor: No Pallor: No Rubor: No Rubor: No No Abnormality No Abnormality N/A Temperature: Yes Yes N/A Tenderness on Palpation: Debridement Debridement N/A Procedures Performed: Treatment Notes Electronic Signature(s) Signed: 04/17/2022 1:11:29 PM By: Fredirick Maudlin MD FACS Entered By: Fredirick Maudlin on 04/17/2022 13:11:29 [name] ([id number]) [id number] Page 4 of 8 -------------------------------------------------------------------------------- Multi-Disciplinary Care Plan Details Patient Name: Date of Service: GUSTAVIA, CARIE 04/17/2022 12:30 PM Medical Record Number: [id number] Patient Account Number: [id number] Date of Birth/Sex: Treating RN: July 31, 1970 (51 y.o. America Brown Primary Care Staphany Ditton: [name] Other  Clinician: Referring Lilliana Turner: Treating Beulah Capobianco/Extender: Annice Needy in Treatment: 6 Multidisciplinary Care Plan reviewed with physician Active Inactive Pain, Acute or Chronic Nursing Diagnoses: Pain, acute or chronic: actual or potential Potential alteration in comfort, pain Goals: Patient will verbalize adequate pain control and receive pain control interventions during procedures as needed Date Initiated: 03/05/2022 Target Resolution Date: 06/27/2022 Goal Status: Active Patient/caregiver will verbalize comfort level met Date Initiated: 03/05/2022 Target Resolution Date: 06/27/2022 Goal Status: Active Interventions: Encourage patient to take pain medications as prescribed Provide education on pain management Treatment Activities: Administer pain control measures as ordered : 03/05/2022 Notes: Wound/Skin Impairment Nursing Diagnoses: Knowledge deficit related to ulceration/compromised skin integrity Goals: Patient/caregiver will verbalize understanding of skin care regimen Date Initiated: 03/05/2022 Target Resolution Date: 06/27/2022 Goal Status: Active Interventions: Assess patient/caregiver ability to obtain necessary supplies Assess patient/caregiver ability to perform ulcer/skin care regimen upon admission and as needed Provide education on ulcer and skin care Treatment Activities: Skin care regimen initiated : 03/05/2022 Topical wound management initiated : 03/05/2022 Notes: Electronic Signature(s) Signed:  04/17/2022 3:00:42 PM By: Dellie Catholic RN Entered By: Dellie Catholic on 04/17/2022 13:40:00 -------------------------------------------------------------------------------- Pain Assessment Details Patient Name: Date of Service: KETSIA, LINEBAUGH D. 04/17/2022 12:30 PM Medical Record Number: [id number] Patient Account Number: [id number] Date of Birth/Sex: Treating RN: [birth year] (51 y.o. America Brown Primary Care Reznor Ferrando:  [name] Other Clinician: JANDY, BRACKENS D ([id number]) [id number] Page 5 of 8 Referring Aijalon Demuro: Treating Jeremie Abdelaziz/Extender: Annice Needy in Treatment: 6 Active Problems Location of Pain Severity and Description of Pain Patient Has Paino No Site Locations Pain Management and Medication Current Pain Management: Electronic Signature(s) Signed: 04/17/2022 3:00:42 PM By: Dellie Catholic RN Entered By: Dellie Catholic on 04/17/2022 12:41:33 -------------------------------------------------------------------------------- Patient/Caregiver Education Details Patient Name: Date of Service: [name] 12/20/2023andnbsp12:30 PM Medical Record Number: [id number] Patient Account Number: [id number] Date of Birth/Gender: Treating RN: [birth year] (51 y.o. America Brown Primary Care Physician: [name] Other Clinician: Referring Physician: Treating Physician/Extender: Annice Needy in Treatment: 6 Education Assessment Education Provided To: Patient Education Topics Provided Wound/Skin Impairment: Methods: Explain/Verbal Responses: Return demonstration correctly Electronic Signature(s) Signed: 04/17/2022 3:00:42 PM By: Dellie Catholic RN Entered By: Dellie Catholic on 04/17/2022 14:58:33 [name] ([id number]) [id number] Page 6 of 8 -------------------------------------------------------------------------------- Wound Assessment Details Patient Name: Date of Service: QUINNLYN, HEARNS D. 04/17/2022 12:30 PM Medical Record Number: [id number] Patient Account Number: [id number] Date of Birth/Sex: Treating RN: [birth year] (51 y.o. America Brown Primary Care Maxmillian Carsey: [name] Other Clinician: Referring Mailee Klaas: Treating Wilma Wuthrich/Extender: Annice Needy in Treatment: 6 Wound Status Wound Number: 9 Primary  Abrasion Etiology: Wound Location: Left Groin Wound Open Wounding Event: Not Known Status: Date Acquired: 03/02/2022 Comorbid Lymphedema, Sleep Apnea, Congestive Heart Failure, Coronary Weeks Of Treatment: 6 History: Artery Disease, Hypertension, Type II Diabetes Clustered Wound: No Photos Wound Measurements Length: (cm) 0. Width: (cm) 1 Depth: (cm) 0. Area: (cm) 0 Volume: (cm) 0 6 % Reduction in Area: 58.4% % Reduction in Volume: 58.4% 1 Epithelialization: Medium (34-66%) .471 Tunneling: No .047 Undermining: No Wound Description Classification: Full Thickness Without Exposed Suppor Wound Margin: Distinct, outline attached Exudate Amount: Small Exudate Type: Serosanguineous Exudate Color: red, brown t Structures Foul Odor After Cleansing: No Slough/Fibrino Yes Wound Bed Granulation Amount: Large (67-100%) Exposed Structure Granulation Quality: Red, Pink Fascia Exposed: No Necrotic Amount: Small (1-33%) Fat Layer (Subcutaneous Tissue) Exposed: Yes Necrotic Quality: Adherent Slough Tendon Exposed: No Muscle Exposed: No Joint Exposed: No Bone Exposed: No Periwound Skin Texture Texture Color No Abnormalities Noted: No No Abnormalities Noted: Yes Callus: No Temperature / Pain Crepitus: No Temperature: No Abnormality Excoriation: No Tenderness on Palpation: Yes Induration: No Rash: No Scarring: Yes Moisture No Abnormalities Noted: [address] FINLAY, MILLS D ([id number]) [id number] Page 7 of 8 Signed: 04/17/2022 3:00:42 PM By: Dellie Catholic RN Entered By: Dellie Catholic on 04/17/2022 12:46:54 -------------------------------------------------------------------------------- Wound Assessment Details Patient Name: Date of Service: KARIGAN, CLONINGER D. 04/17/2022 12:30 PM Medical Record Number: [id number] Patient Account Number: [id number] Date of Birth/Sex: Treating RN: Jun 08, 1970 (51 y.o. America Brown Primary Care Naiomi Musto: [name] Other Clinician: Referring Florence Antonelli: Treating Lisel Siegrist/Extender: Annice Needy in Treatment: 6 Wound Status Wound Number: 9 Primary Abrasion Etiology: Wound Location: Left Groin Wound Open Wounding Event: Not Known Status: Date Acquired: 03/02/2022 Comorbid Lymphedema, Sleep Apnea, Congestive Heart Failure, Coronary Weeks Of Treatment: 6 History: Artery Disease, Hypertension, Type II Diabetes Clustered Wound: No Wound Measurements Length: (cm) 0.6 Width: (  cm) 1 Depth: (cm) 0.1 Area: (cm) 0.471 Volume: (cm) 0.047 % Reduction in Area: 58.4% % Reduction in Volume: 58.4% Epithelialization: Medium (34-66%) Tunneling: No Undermining: No Wound Description Classification: Full Thickness Without Exposed Support Structures Wound Margin: Distinct, outline attached Exudate Amount: Small Exudate Type: Serosanguineous Exudate Color: red, brown Foul Odor After Cleansing: No Slough/Fibrino Yes Wound Bed Granulation Amount: Large (67-100%) Exposed Structure Granulation Quality: Red, Pink Fascia Exposed: No Necrotic Amount: Small (1-33%) Fat Layer (Subcutaneous Tissue) Exposed: Yes Necrotic Quality: Adherent Slough Tendon Exposed: No Muscle Exposed: No Joint Exposed: No Bone Exposed: No Periwound Skin Texture Texture Color No Abnormalities Noted: No No Abnormalities Noted: Yes Callus: No Temperature / Pain Crepitus: No Temperature: No Abnormality Excoriation: No Tenderness on Palpation: Yes Induration: No Rash: No Scarring: Yes Moisture No Abnormalities Noted: Yes Treatment Notes Wound #9 (Groin) Wound Laterality: Left Cleanser Soap and Water Discharge Instruction: May shower and wash wound with dial antibacterial soap and water prior to dressing change. Peri-Wound Care Topical CHRISTI, WIRICK D ([id number]) [id number] Page 8 of 8 Primary Dressing KerraCel Ag  Gelling Fiber Dressing, 4x5 in (silver alginate) Discharge Instruction: Apply silver alginate to wound bed as instructed Secondary Dressing Woven Gauze Sponge, Non-Sterile 4x4 in Discharge Instruction: Apply over primary dressing as directed. Secured With 78M Medipore H Soft Cloth Surgical T ape, 4 x 10 (in/yd) Discharge Instruction: Secure with tape as directed. Compression Wrap Compression Stockings Add-Ons Electronic Signature(s) Signed: 04/17/2022 3:00:42 PM By: Dellie Catholic RN Entered By: Dellie Catholic on 04/17/2022 12:48:04 -------------------------------------------------------------------------------- Vitals Details Patient Name: Date of Service: [name], Jearldean D. 04/17/2022 12:30 PM Medical Record Number: [id number] Patient Account Number: [id number] Date of Birth/Sex: Treating RN: 1971/02/09 (51 y.o. America Brown Primary Care Deionna Marcantonio: [name] Other Clinician: Referring Paxon Propes: Treating Legend Pecore/Extender: Annice Needy in Treatment: 6 Vital Signs Time Taken: 12:36 Temperature (F): 97.9 Height (in): 69 Pulse (bpm): 101 Weight (lbs): 300 Respiratory Rate (breaths/min): 18 Body Mass Index (BMI): 44.3 Blood Pressure (mmHg): 122/83 Reference Range: 80 - 120 mg / dl Electronic Signature(s) Signed: 04/17/2022 3:00:42 PM By: Dellie Catholic RN Entered By: Dellie Catholic on 04/17/2022 12:41:26

## 2022-04-18 ENCOUNTER — Telehealth: Payer: Self-pay | Admitting: Internal Medicine

## 2022-04-18 DIAGNOSIS — K5909 Other constipation: Secondary | ICD-10-CM | POA: Diagnosis not present

## 2022-04-18 DIAGNOSIS — K649 Unspecified hemorrhoids: Secondary | ICD-10-CM | POA: Diagnosis not present

## 2022-04-18 NOTE — Progress Notes (Signed)
CIRCE, CHILTON D ([id number]) [id number] Page 1 of 10 Visit Report for 04/17/2022 Chief Complaint Document Details Patient Name: Date of Service: [name], [name] 04/17/2022 12:30 PM Medical Record Number: [id number] Patient Account Number: [id number] Date of Birth/Sex: Treating RN: 1970/12/21 (51 y.o. F) Primary Care Provider: [name] Other Clinician: Referring Provider: Treating Provider/Extender: Annice Needy in Treatment: 6 Information Obtained from: Patient Chief Complaint 07/23/2021; Left lower extremity wound 03/05/2022: Returns with a wound in the same location on her left lower extremity, as well as 1 in her left groin. Electronic Signature(s) Signed: 04/17/2022 1:11:35 PM By: Fredirick Maudlin MD FACS Entered By: Fredirick Maudlin on 04/17/2022 13:11:34 -------------------------------------------------------------------------------- Debridement Details Patient Name: Date of Service: Megan Session D. 04/17/2022 12:30 PM Medical Record Number: [id number] Patient Account Number: [id number] Date of Birth/Sex: Treating RN: January 03, 1971 (51 y.o. America Brown Primary Care Provider: [name] Other Clinician: Referring Provider: Treating Provider/Extender: Annice Needy in Treatment: 6 Debridement Performed for Assessment: Wound #9 Left Groin Performed By: Physician Fredirick Maudlin, MD Debridement Type: Debridement Level of Consciousness (Pre-procedure): Awake and Alert Pre-procedure Verification/Time Out Yes - 12:41 Taken: Start Time: 12:41 Pain Control: Lidocaine 5% topical ointment T Area Debrided (L x W): otal 0.6 (cm) x 1 (cm) = 0.6 (cm) Tissue and other material debrided: Non-Viable, Slough, Biofilm, Slough Level: Non-Viable Tissue Debridement Description: Selective/Open Wound Instrument: Curette Bleeding: Minimum Hemostasis Achieved: Pressure End Time:  12:42 Procedural Pain: 0 Post Procedural Pain: 0 Response to Treatment: Procedure was tolerated well Level of Consciousness (Post- Awake and Alert procedure): Post Debridement Measurements of Total Wound Length: (cm) 0.6 Width: (cm) 1 Depth: (cm) 0.1 Volume: (cm) 0.047 Character of Wound/Ulcer Post Debridement: Improved RICKITA, FORSTNER D ([id number]) [id number] Page 2 of 10 Post Procedure Diagnosis Same as Pre-procedure Notes Scribed for Dr. Celine Ahr by J.Scotton Electronic Signature(s) Signed: 04/17/2022 3:00:42 PM By: Dellie Catholic RN Signed: 04/17/2022 4:53:29 PM By: Fredirick Maudlin MD FACS Entered By: Dellie Catholic on 04/17/2022 12:54:28 -------------------------------------------------------------------------------- HPI Details Patient Name: Date of Service: [name], [name] 04/17/2022 12:30 PM Medical Record Number: [id number] Patient Account Number: [id number] Date of Birth/Sex: Treating RN: 27-May-1970 (51 y.o. F) Primary Care Provider: [name] Other Clinician: Referring Provider: Treating Provider/Extender: Annice Needy in Treatment: 6 History of Present Illness HPI Description: Admission 6/24 [name] is a 51 year old female with a past medical history of chronic venous insufficiency, lymphedema, DVT on anticoagulation and diastolic heart failure that presents to the clinic for left lower extremity wound. She was last seen 4 months ago in our clinic for the same issue. The reoccurring wound started at the end of May and has been going on for 1 month. She has been using an ointment and I am unclear what this is. She tries to keep her leg elevated with her compression stocking. She also reports she has lymphedema pumps and has been using them as well. She reports mild pain to the area. She denies signs of infection including increased warmth, erythema or purulent drainage. 7/1; patient  presents for 1 week follow-up. She has tolerated the wrap well. Unfortunately she did have trouble with the wrap Sliding down her leg 2 days ago. She has no issues or complaints today. She denies signs of infection. 7/15; patient presents for follow-up. She has tolerated the wrap well. She has no issues or complaints today. She denies signs of infection. 7/25; patient presents for 1 week follow-up.  She has no issues or complaints today. She denies signs of infection. 8/4; patient presents for follow-up. She has tolerated the compression wrap well to her left lower extremity. She states she was in the hospital last week due to fluid overload. She subsequently developed blisters to her bottom from being in the hospital bed for prolonged periods of time. These have since ruptured. She now has 2 areas of skin breakdown. She has been keeping gauze on them. She denies signs of infection. 8/18; patient presents for 2-week follow-up. She reports improvement to her buttocks wounds. She is able to tolerate the wrap well to her left lower extremity with no issues. She denies signs of infection. 9/1; patient presents for 2-week follow-up. She has no issues or complaints today. she denies signs of infection. 9/15; patient presents for 2-week follow-up. She has no issues or complaints today. She has tolerated the wrap well. She denies signs of infection. 9/27; patient presents for 1 week follow-up. She has no issues or complaints today. 10/20; patient presents for follow-up. She was on vacation for the past 3 weeks. She states she had the last clinic visit wrap in place for 1 week and then she took this off. She states that she has been using her compression wraps since. She states that the wound has gotten larger. She reports pain to the wound site. She denies fever/chills, nausea/vomiting. She denies purulent drainage. 10/24; patient presents for follow-up. She states she tolerated the Kerlix/Coban wrap well. She  reports decrease in pain. She states she started Keflex. She denies systemic signs of infection. 10/28; patient presents for follow-up. She has no issues or complaints today. She denies acute pain. She tolerated the wrap well. 11/4; patient presents for follow-up. She has no issues or complaints. She denies signs of infection. 11/11; patient presents for follow-up. She has no issues or complaints today. She denies signs of infection. She tolerated the wrap well. 11/18; patient presents for follow-up. She states she took a shower however despite a plastic bag the wrap got wet. She currently denies signs of infection. 12/2; patient presents for follow-up. She tolerated the wrap well. She has no issues or complaints today. 12/12; patient presents for follow-up. She has no issues or complaints today. Readmission 07/23/2021 Ms. Toini Failla is a 51 year old female with a past medical history of chronic venous insufficiency that presents to the clinic for a left lower extremity wound. She was seen previously in the clinic for the same wound and this was healed on 04/09/2021. Since then she has been wearing her compression stockings intermittently. She reports more swelling to her left lower extremity over the past month and she stopped wearing her compression stocking. Subsequently she developed a wound. Her Lasix has been increased and she has noticed an improvement in her swelling. She currently denies signs of infection. ASIANA, BENNINGER D ([id number]) [id number] Page 3 of 10 4/6; patient presents for follow-up. She states that the wrap stayed in place for 2 days before it rolled down and she had to take it off. She has compression stockings however states she could not get these on due to the swelling. She denies signs of infection. 4/13; patient with chronic venous insufficiency that has been in this clinic previously. She has a small pair of open areas on the left lower  leg in the middle of previous scar tissue from I believe a previous wound. Most concerning today is how poor her edema control is above the sclerotic skin in her  lower extremity. This is nonpitting and compatible with lymphedema. She tells me that she was using a juxta lite stocking although it was old we are apparently replacing this. She does not seem aware of how to adjust the tension with this and I think she is going to require 30/40 mmHg equivalent compression She has her arterial studies tomorrow at 2:00 4/25; patient presents for follow-up. ABIs done on 4/14 showed a left ABI of 1.04 and TBI of 0.99. She tolerated the compression wrap well This week. She states she is going to obtain compression stockings today. She has no issues or complaints today. 5/2; patient presents for follow-up. She states her compression stockings are being delivered today. Her wound is closed today. READMISSION 03/05/2022 Despite having worn her juxta lite stockings, the patient reports that the area on her left medial lower leg reopened a couple of weeks ago. She says that she has been applying lotion and leaving it open to air to dry. She also had a new wound open in her left groin. She is not sure how this started, but says it may have been an ingrown hair or pimple. There is slough accumulation in both sites. The leg wound is fairly desiccated. 03/12/2022: After our visit last week, she presented to the emergency department very short of breath. She was thought to be having a CHF exacerbation but was also influenza A positive. She was admitted to [facility] for about a week. She was just discharged yesterday morning. Both wounds are smaller today. The leg wound has epithelialized substantially with just 2 open areas. Edema control is excellent. The groin wound has also contracted. There is a little slough on the surface. 03/26/2022: The wound on her leg is healed. The wound in her groin measured a  little larger today and there is some slough on the surface. 04/03/2022: The wound on her leg remains closed. She is wearing her compression stockings and her edema control is good. The wound in her groin is a little smaller this week with light slough accumulation. 04/17/2022: The groin wound has contracted further and is a bit shallower. There is slough on the surface. No concern for infection. Electronic Signature(s) Signed: 04/17/2022 1:12:03 PM By: Fredirick Maudlin MD FACS Entered By: Fredirick Maudlin on 04/17/2022 13:12:03 -------------------------------------------------------------------------------- Physical Exam Details Patient Name: Date of Service: Megan Session D. 04/17/2022 12:30 PM Medical Record Number: [id number] Patient Account Number: [id number] Date of Birth/Sex: Treating RN: 01/30/1971 (51 y.o. F) Primary Care Provider: [name] Other Clinician: Referring Provider: Treating Provider/Extender: Annice Needy in Treatment: 6 Constitutional . Slightly tachycardic, asymptomatic.. . . No acute distress. Respiratory Normal work of breathing on room air. Notes 04/17/2022: The groin wound has contracted further and is a bit shallower. There is slough on the surface. No concern for infection. Electronic Signature(s) Signed: 04/17/2022 1:12:37 PM By: Fredirick Maudlin MD FACS Entered By: Fredirick Maudlin on 04/17/2022 13:12:36 -------------------------------------------------------------------------------- Physician Orders Details Patient Name: Date of Service: Megan Session D. 04/17/2022 12:30 PM Medical Record Number: [id number] Patient Account Number: [id number] DONYE, DAUENHAUER ([id number]) [id number] Page 4 of 10 Date of Birth/Sex: Treating RN: 1970/11/12 (51 y.o. America Brown Primary Care Provider: Other Clinician: [name] Referring Provider: Treating Provider/Extender: Annice Needy in Treatment: 6 Verbal / Phone Orders: No Diagnosis Coding ICD-10 Coding Code Description [phone] Non-pressure chronic ulcer of left thigh with fat layer exposed I50.30 Unspecified diastolic (congestive) heart failure I87.2  Venous insufficiency (chronic) (peripheral) I89.0 Lymphedema, not elsewhere classified E66.01 Morbid (severe) obesity due to excess calories Follow-up Appointments ppointment in 2 weeks. - Dr. Celine Ahr Room 3 Return A Anesthetic Wound #9 Left Groin (In clinic) Topical Lidocaine 4% applied to wound bed Bathing/ Shower/ Hygiene May shower and wash wound with soap and water. Edema Control - Lymphedema / SCD / Other Elevate legs to the level of the heart or above for 30 minutes daily and/or when sitting, a frequency of: - 3-4 times a day throughout the day. Avoid standing for long periods of time. Exercise regularly Moisturize legs daily. - nightly after removing stockings Compression stocking or Garment 20-30 mm/Hg pressure to: - may be purchased at [facility], [facility] or [facility] Wound #9 - Groin Wound Laterality: Left Cleanser: Soap and Water 1 x Per Day/30 Days Discharge Instructions: May shower and wash wound with dial antibacterial soap and water prior to dressing change. Prim Dressing: KerraCel Ag Gelling Fiber Dressing, 4x5 in (silver alginate) 1 x Per Day/30 Days ary Discharge Instructions: Apply silver alginate to wound bed as instructed Secondary Dressing: Woven Gauze Sponge, Non-Sterile 4x4 in 1 x Per Day/30 Days Discharge Instructions: Apply over primary dressing as directed. Secured With: 39M Medipore H Soft Cloth Surgical T ape, 4 x 10 (in/yd) 1 x Per Day/30 Days Discharge Instructions: Secure with tape as directed. Electronic Signature(s) Signed: 04/17/2022 4:53:29 PM By: Fredirick Maudlin MD FACS Entered By: Fredirick Maudlin on 04/17/2022  13:16:05 -------------------------------------------------------------------------------- Problem List Details Patient Name: Date of Service: Megan Session D. 04/17/2022 12:30 PM Medical Record Number: [id number] Patient Account Number: [id number] Date of Birth/Sex: Treating RN: 1971/03/31 (51 y.o. F) Primary Care Provider: [name] Other Clinician: Referring Provider: Treating Provider/Extender: Annice Needy in Treatment: 6 Active Problems ICD-10 TARANEH, METHENEY D ([id number]) [id number] Page 5 of 10 Encounter Code Description Active Date MDM Diagnosis L97.122 Non-pressure chronic ulcer of left thigh with fat layer exposed 03/05/2022 No Yes I50.30 Unspecified diastolic (congestive) heart failure 03/05/2022 No Yes I87.2 Venous insufficiency (chronic) (peripheral) 03/05/2022 No Yes I89.0 Lymphedema, not elsewhere classified 03/05/2022 No Yes E66.01 Morbid (severe) obesity due to excess calories 03/05/2022 No Yes Inactive Problems Resolved Problems ICD-10 Code Description Active Date Resolved Date L97.822 Non-pressure chronic ulcer of other part of left lower leg with fat layer exposed 03/05/2022 03/05/2022 Electronic Signature(s) Signed: 04/17/2022 1:11:21 PM By: Fredirick Maudlin MD FACS Entered By: Fredirick Maudlin on 04/17/2022 13:11:21 -------------------------------------------------------------------------------- Progress Note Details Patient Name: Date of Service: [name], Yakelin D. 04/17/2022 12:30 PM Medical Record Number: [id number] Patient Account Number: [id number] Date of Birth/Sex: Treating RN: 10-26-1970 (51 y.o. F) Primary Care Provider: [name] Other Clinician: Referring Provider: Treating Provider/Extender: Annice Needy in Treatment: 6 Subjective Chief Complaint Information obtained from Patient 07/23/2021; Left lower extremity wound 03/05/2022: Returns with a  wound in the same location on her left lower extremity, as well as 1 in her left groin. History of Present Illness (HPI) Admission 6/24 Ms. Neosha Switalski is a 51 year old female with a past medical history of chronic venous insufficiency, lymphedema, DVT on anticoagulation and diastolic heart failure that presents to the clinic for left lower extremity wound. She was last seen 4 months ago in our clinic for the same issue. The reoccurring wound started at the end of May and has been going on for 1 month. She has been using an ointment and I am unclear what this is. She tries  to keep her leg elevated with her compression stocking. She also reports she has lymphedema pumps and has been using them as well. She reports mild pain to the area. She denies signs of infection including increased warmth, erythema or purulent drainage. 7/1; patient presents for 1 week follow-up. She has tolerated the wrap well. Unfortunately she did have trouble with the wrap Sliding down her leg 2 days ago. She has no issues or complaints today. She denies signs of infection. 7/15; patient presents for follow-up. She has tolerated the wrap well. She has no issues or complaints today. She denies signs of infection. 7/25; patient presents for 1 week follow-up. She has no issues or complaints today. She denies signs of infection. 8/4; patient presents for follow-up. She has tolerated the compression wrap well to her left lower extremity. She states she was in the hospital last week due to fluid overload. She subsequently developed blisters to her bottom from being in the hospital bed for prolonged periods of time. These have since ruptured. She GEORGIANNA, BAND D ([id number]) [id number] Page 6 of 10 now has 2 areas of skin breakdown. She has been keeping gauze on them. She denies signs of infection. 8/18; patient presents for 2-week follow-up. She reports improvement to her buttocks wounds. She is  able to tolerate the wrap well to her left lower extremity with no issues. She denies signs of infection. 9/1; patient presents for 2-week follow-up. She has no issues or complaints today. she denies signs of infection. 9/15; patient presents for 2-week follow-up. She has no issues or complaints today. She has tolerated the wrap well. She denies signs of infection. 9/27; patient presents for 1 week follow-up. She has no issues or complaints today. 10/20; patient presents for follow-up. She was on vacation for the past 3 weeks. She states she had the last clinic visit wrap in place for 1 week and then she took this off. She states that she has been using her compression wraps since. She states that the wound has gotten larger. She reports pain to the wound site. She denies fever/chills, nausea/vomiting. She denies purulent drainage. 10/24; patient presents for follow-up. She states she tolerated the Kerlix/Coban wrap well. She reports decrease in pain. She states she started Keflex. She denies systemic signs of infection. 10/28; patient presents for follow-up. She has no issues or complaints today. She denies acute pain. She tolerated the wrap well. 11/4; patient presents for follow-up. She has no issues or complaints. She denies signs of infection. 11/11; patient presents for follow-up. She has no issues or complaints today. She denies signs of infection. She tolerated the wrap well. 11/18; patient presents for follow-up. She states she took a shower however despite a plastic bag the wrap got wet. She currently denies signs of infection. 12/2; patient presents for follow-up. She tolerated the wrap well. She has no issues or complaints today. 12/12; patient presents for follow-up. She has no issues or complaints today. Readmission 07/23/2021 Ms. Baylor Teegarden is a 51 year old female with a past medical history of chronic venous insufficiency that presents to the clinic for a left lower  extremity wound. She was seen previously in the clinic for the same wound and this was healed on 04/09/2021. Since then she has been wearing her compression stockings intermittently. She reports more swelling to her left lower extremity over the past month and she stopped wearing her compression stocking. Subsequently she developed a wound. Her Lasix has been increased and she has noticed an improvement  in her swelling. She currently denies signs of infection. 4/6; patient presents for follow-up. She states that the wrap stayed in place for 2 days before it rolled down and she had to take it off. She has compression stockings however states she could not get these on due to the swelling. She denies signs of infection. 4/13; patient with chronic venous insufficiency that has been in this clinic previously. She has a small pair of open areas on the left lower leg in the middle of previous scar tissue from I believe a previous wound. Most concerning today is how poor her edema control is above the sclerotic skin in her lower extremity. This is nonpitting and compatible with lymphedema. She tells me that she was using a juxta lite stocking although it was old we are apparently replacing this. She does not seem aware of how to adjust the tension with this and I think she is going to require 30/40 mmHg equivalent compression She has her arterial studies tomorrow at 2:00 4/25; patient presents for follow-up. ABIs done on 4/14 showed a left ABI of 1.04 and TBI of 0.99. She tolerated the compression wrap well This week. She states she is going to obtain compression stockings today. She has no issues or complaints today. 5/2; patient presents for follow-up. She states her compression stockings are being delivered today. Her wound is closed today. READMISSION 03/05/2022 Despite having worn her juxta lite stockings, the patient reports that the area on her left medial lower leg reopened a couple of weeks ago.  She says that she has been applying lotion and leaving it open to air to dry. She also had a new wound open in her left groin. She is not sure how this started, but says it may have been an ingrown hair or pimple. There is slough accumulation in both sites. The leg wound is fairly desiccated. 03/12/2022: After our visit last week, she presented to the emergency department very short of breath. She was thought to be having a CHF exacerbation but was also influenza A positive. She was admitted to [facility] for about a week. She was just discharged yesterday morning. Both wounds are smaller today. The leg wound has epithelialized substantially with just 2 open areas. Edema control is excellent. The groin wound has also contracted. There is a little slough on the surface. 03/26/2022: The wound on her leg is healed. The wound in her groin measured a little larger today and there is some slough on the surface. 04/03/2022: The wound on her leg remains closed. She is wearing her compression stockings and her edema control is good. The wound in her groin is a little smaller this week with light slough accumulation. 04/17/2022: The groin wound has contracted further and is a bit shallower. There is slough on the surface. No concern for infection. Patient History Information obtained from Patient. Family History Diabetes - Mother,Maternal Grandparents, Heart Disease - Paternal Grandparents, Hypertension - Mother,Maternal Grandparents, Lung Disease - Father,Paternal Grandparents, No family history of Cancer, Hereditary Spherocytosis, Kidney Disease, Seizures, Stroke, Thyroid Problems, Tuberculosis. Social History Former smoker, Marital Status - Single, Alcohol Use - Never, Drug Use - No History, Caffeine Use - Daily. Medical History Eyes Denies history of Cataracts, Glaucoma, Optic Neuritis Ear/Nose/Mouth/Throat Denies history of Chronic sinus problems/congestion, Middle ear  problems Hematologic/Lymphatic Patient has history of Lymphedema Denies history of Anemia, Hemophilia, Human Immunodeficiency Virus, Sickle Cell Disease Respiratory Patient has history of Sleep Apnea ZIOMARA, BIRENBAUM D ([id number]) [id number] Page  7 of 10 Denies history of Aspiration, Asthma, Chronic Obstructive Pulmonary Disease (COPD), Pneumothorax, Tuberculosis Cardiovascular Patient has history of Congestive Heart Failure, Coronary Artery Disease, Hypertension Denies history of Angina, Arrhythmia, Deep Vein Thrombosis, Hypotension, Myocardial Infarction, Peripheral Arterial Disease, Peripheral Venous Disease, Phlebitis, Vasculitis Gastrointestinal Denies history of Cirrhosis , Colitis, Crohnoos, Hepatitis A, Hepatitis B, Hepatitis C Endocrine Patient has history of Type II Diabetes Denies history of Type I Diabetes Genitourinary Denies history of End Stage Renal Disease Immunological Denies history of Lupus Erythematosus, Raynaudoos, Scleroderma Integumentary (Skin) Denies history of History of Burn Musculoskeletal Denies history of Gout, Rheumatoid Arthritis, Osteoarthritis, Osteomyelitis Neurologic Denies history of Dementia, Neuropathy, Quadriplegia, Paraplegia, Seizure Disorder Oncologic Denies history of Received Chemotherapy, Received Radiation Psychiatric Denies history of Anorexia/bulimia, Confinement Anxiety Objective Constitutional Slightly tachycardic, asymptomatic.[name] No acute distress. Vitals Time Taken: 12:36 PM, Height: 69 in, Weight: 300 lbs, BMI: 44.3, Temperature: 97.9 F, Pulse: 101 bpm, Respiratory Rate: 18 breaths/min, Blood Pressure: 122/83 mmHg. Respiratory Normal work of breathing on room air. General Notes: 04/17/2022: The groin wound has contracted further and is a bit shallower. There is slough on the surface. No concern for infection. Integumentary (Hair, Skin) Wound #9 status is Open. Original cause of wound was Not  Known. The date acquired was: 03/02/2022. The wound has been in treatment 6 weeks. The wound is located on the Left Groin. The wound measures 0.6cm length x 1cm width x 0.1cm depth; 0.471cm^2 area and 0.047cm^3 volume. There is Fat Layer (Subcutaneous Tissue) exposed. There is no tunneling or undermining noted. There is a small amount of serosanguineous drainage noted. The wound margin is distinct with the outline attached to the wound base. There is large (67-100%) red, pink granulation within the wound bed. There is a small (1-33%) amount of necrotic tissue within the wound bed including Adherent Slough. The periwound skin appearance had no abnormalities noted for moisture. The periwound skin appearance had no abnormalities noted for color. The periwound skin appearance exhibited: Scarring. The periwound skin appearance did not exhibit: Callus, Crepitus, Excoriation, Induration, Rash. Periwound temperature was noted as No Abnormality. The periwound has tenderness on palpation. Wound #9 status is Open. Original cause of wound was Not Known. The date acquired was: 03/02/2022. The wound has been in treatment 6 weeks. The wound is located on the Left Groin. The wound measures 0.6cm length x 1cm width x 0.1cm depth; 0.471cm^2 area and 0.047cm^3 volume. There is Fat Layer (Subcutaneous Tissue) exposed. There is no tunneling or undermining noted. There is a small amount of serosanguineous drainage noted. The wound margin is distinct with the outline attached to the wound base. There is large (67-100%) red, pink granulation within the wound bed. There is a small (1-33%) amount of necrotic tissue within the wound bed including Adherent Slough. The periwound skin appearance had no abnormalities noted for moisture. The periwound skin appearance had no abnormalities noted for color. The periwound skin appearance exhibited: Scarring. The periwound skin appearance did not exhibit: Callus, Crepitus, Excoriation,  Induration, Rash. Periwound temperature was noted as No Abnormality. The periwound has tenderness on palpation. Assessment Active Problems ICD-10 Non-pressure chronic ulcer of left thigh with fat layer exposed Unspecified diastolic (congestive) heart failure Venous insufficiency (chronic) (peripheral) Lymphedema, not elsewhere classified Morbid (severe) obesity due to excess calories Procedures GALILEAH, PIGGEE D ([id number]) [id number] Page 8 of 10 Wound #9 Pre-procedure diagnosis of Wound #9 is an Abrasion located on the Left Groin . There was a Selective/Open Wound Non-Viable Tissue Debridement with a  total area of 0.6 sq cm performed by Fredirick Maudlin, MD. With the following instrument(s): Curette to remove Non-Viable tissue/material. Material removed includes Slough and Biofilm and after achieving pain control using Lidocaine 5% topical ointment. No specimens were taken. A time out was conducted at 12:41, prior to the start of the procedure. A Minimum amount of bleeding was controlled with Pressure. The procedure was tolerated well with a pain level of 0 throughout and a pain level of 0 following the procedure. Post Debridement Measurements: 0.6cm length x 1cm width x 0.1cm depth; 0.047cm^3 volume. Character of Wound/Ulcer Post Debridement is improved. Post procedure Diagnosis Wound #9: Same as Pre-Procedure General Notes: Scribed for Dr. Celine Ahr by J.Scotton. Plan Follow-up Appointments: Return Appointment in 2 weeks. - Dr. Celine Ahr Room 3 Anesthetic: Wound #9 Left Groin: (In clinic) Topical Lidocaine 4% applied to wound bed Bathing/ Shower/ Hygiene: May shower and wash wound with soap and water. Edema Control - Lymphedema / SCD / Other: Elevate legs to the level of the heart or above for 30 minutes daily and/or when sitting, a frequency of: - 3-4 times a day throughout the day. Avoid standing for long periods of time. Exercise regularly Moisturize  legs daily. - nightly after removing stockings Compression stocking or Garment 20-30 mm/Hg pressure to: - may be purchased at [employer] [facility], [facility] or [place] [facility]: - Groin Wound Laterality: Left Cleanser: Soap and Water 1 x Per Day/30 Days Discharge Instructions: May shower and wash wound with dial antibacterial soap and water prior to dressing change. Prim Dressing: KerraCel Ag Gelling Fiber Dressing, 4x5 in (silver alginate) 1 x Per Day/30 Days ary Discharge Instructions: Apply silver alginate to wound bed as instructed Secondary Dressing: Woven Gauze Sponge, Non-Sterile 4x4 in 1 x Per Day/30 Days Discharge Instructions: Apply over primary dressing as directed. Secured With: 2M Medipore H Soft Cloth Surgical T ape, 4 x 10 (in/yd) 1 x Per Day/30 Days Discharge Instructions: Secure with tape as directed. 04/17/2022: The groin wound has contracted further and is a bit shallower. There is slough on the surface. No concern for infection. I used a curette to debride the slough from the wound surface. We will continue silver alginate to the wound. Follow-up in 2 weeks. Electronic Signature(s) Signed: 04/17/2022 1:16:38 PM By: Fredirick Maudlin MD FACS Entered By: Fredirick Maudlin on 04/17/2022 13:16:38 -------------------------------------------------------------------------------- HxROS Details Patient Name: Date of Service: [name], Belen D. 04/17/2022 12:30 PM Medical Record Number: [id number] Patient Account Number: [id number] Date of Birth/Sex: Treating RN: 06-14-1970 (51 y.o. F) Primary Care Provider: [name] Other Clinician: Referring Provider: Treating Provider/Extender: Annice Needy in Treatment: 6 Information Obtained From Patient Eyes Medical History: Negative for: Cataracts; Glaucoma; Optic Neuritis Ear/Nose/Mouth/Throat Medical History: Negative for: Chronic sinus problems/congestion; Middle ear problems KORIN, SETZLER ([id number]) [phone].pdf Page 9 of 10 Hematologic/Lymphatic Medical History: Positive for: Lymphedema Negative for: Anemia; Hemophilia; Human Immunodeficiency Virus; Sickle Cell Disease Respiratory Medical History: Positive for: Sleep Apnea Negative for: Aspiration; Asthma; Chronic Obstructive Pulmonary Disease (COPD); Pneumothorax; Tuberculosis Cardiovascular Medical History: Positive for: Congestive Heart Failure; Coronary Artery Disease; Hypertension Negative for: Angina; Arrhythmia; Deep Vein Thrombosis; Hypotension; Myocardial Infarction; Peripheral Arterial Disease; Peripheral Venous Disease; Phlebitis; Vasculitis Gastrointestinal Medical History: Negative for: Cirrhosis ; Colitis; Crohns; Hepatitis A; Hepatitis B; Hepatitis C Endocrine Medical History: Positive for: Type II Diabetes Negative for: Type I Diabetes Time with diabetes: 2002 Treated with: Oral agents, Diet Blood sugar tested every day: Yes Tested : TID  Genitourinary Medical History: Negative for: End Stage Renal Disease Immunological Medical History: Negative for: Lupus Erythematosus; Raynauds; Scleroderma Integumentary (Skin) Medical History: Negative for: History of Burn Musculoskeletal Medical History: Negative for: Gout; Rheumatoid Arthritis; Osteoarthritis; Osteomyelitis Neurologic Medical History: Negative for: Dementia; Neuropathy; Quadriplegia; Paraplegia; Seizure Disorder Oncologic Medical History: Negative for: Received Chemotherapy; Received Radiation Psychiatric Medical History: Negative for: Anorexia/bulimia; Confinement Anxiety Immunizations Pneumococcal Vaccine: Received Pneumococcal Vaccination: No Implantable Devices None Family and Social History Cancer: No; Diabetes: Yes - Mother,Maternal Grandparents; Heart Disease: Yes - Paternal Grandparents; Hereditary Spherocytosis: No; HypertensionVelta Addison HARMONY, SANDELL D ([id number])  [id number] Page 10 of 10 Mother,Maternal Grandparents; Kidney Disease: No; Lung Disease: Yes - Father,Paternal Grandparents; Seizures: No; Stroke: No; Thyroid Problems: No; Tuberculosis: No; Former smoker; Marital Status - Single; Alcohol Use: Never; Drug Use: No History; Caffeine Use: Daily; Financial Concerns: No; Food, Clothing or Shelter Needs: No; Support System Lacking: No; Transportation Concerns: No Electronic Signature(s) Signed: 04/17/2022 4:53:29 PM By: Fredirick Maudlin MD FACS Entered By: Fredirick Maudlin on 04/17/2022 13:12:11 -------------------------------------------------------------------------------- SuperBill Details Patient Name: Date of Service: Megan Session D. 04/17/2022 Medical Record Number: [id number] Patient Account Number: [id number] Date of Birth/Sex: Treating RN: 1971-01-05 (51 y.o. F) Primary Care Provider: [name] Other Clinician: Referring Provider: Treating Provider/Extender: Annice Needy in Treatment: 6 Diagnosis Coding ICD-10 Codes Code Description [phone] Non-pressure chronic ulcer of left thigh with fat layer exposed I50.30 Unspecified diastolic (congestive) heart failure I87.2 Venous insufficiency (chronic) (peripheral) I89.0 Lymphedema, not elsewhere classified E66.01 Morbid (severe) obesity due to excess calories Facility Procedures : CPT4 Code: [id number] Description: 84132 - DEBRIDE WOUND 1ST 20 SQ CM OR < ICD-10 Diagnosis Description L97.122 Non-pressure chronic ulcer of left thigh with fat layer exposed Modifier: Quantity: 1 Physician Procedures : CPT4 Code Description Modifier [id number] - WC PHYS LEVEL 3 - EST PT 25 ICD-10 Diagnosis Description L97.122 Non-pressure chronic ulcer of left thigh with fat layer exposed I50.30 Unspecified diastolic (congestive) heart failure E66.01 Morbid  (severe) obesity due to excess calories I89.0 Lymphedema, not elsewhere  classified Quantity: 1 : [id number] 25956 - WC PHYS DEBR WO ANESTH 20 SQ CM ICD-10 Diagnosis Description L87.564 Non-pressure chronic ulcer of left thigh with fat layer exposed Quantity: 1 Electronic Signature(s) Signed: 04/17/2022 1:17:00 PM By: Fredirick Maudlin MD FACS Entered By: Fredirick Maudlin on 04/17/2022 13:17:00

## 2022-04-18 NOTE — Telephone Encounter (Signed)
Copied from [place] [phone]. Topic: General - Other >> Apr 18, 2022 10:19 AM Tiffany B wrote: Reason for CRM: Patient following up regarding 04/17/2022 NT message, informed caller of PCP recommendation regarding going to the UC. Patient voice understanding and will go to UC. Also placed patient on wait list.

## 2022-04-19 NOTE — Telephone Encounter (Signed)
Noted  

## 2022-04-19 NOTE — Telephone Encounter (Signed)
In another encounter, noted that patient is aware to go to UC.

## 2022-04-23 ENCOUNTER — Other Ambulatory Visit: Payer: Self-pay

## 2022-04-23 ENCOUNTER — Encounter (HOSPITAL_BASED_OUTPATIENT_CLINIC_OR_DEPARTMENT_OTHER): Payer: Self-pay | Admitting: Emergency Medicine

## 2022-04-23 ENCOUNTER — Emergency Department (HOSPITAL_BASED_OUTPATIENT_CLINIC_OR_DEPARTMENT_OTHER)
Admission: EM | Admit: 2022-04-23 | Discharge: 2022-04-23 | Disposition: A | Discharge status: Home / Self Care | Payer: Medicaid Other | Attending: Emergency Medicine | Admitting: Emergency Medicine

## 2022-04-23 DIAGNOSIS — K6289 Other specified diseases of anus and rectum: Secondary | ICD-10-CM | POA: Diagnosis present

## 2022-04-23 DIAGNOSIS — Z7901 Long term (current) use of anticoagulants: Secondary | ICD-10-CM | POA: Insufficient documentation

## 2022-04-23 DIAGNOSIS — K644 Residual hemorrhoidal skin tags: Secondary | ICD-10-CM | POA: Diagnosis not present

## 2022-04-23 MED ORDER — NIFEDIPINE 0.3 % OINTMENT: 1.0000 | TOPICAL_OINTMENT | Freq: Four times a day (QID) | CUTANEOUS | 0 refills | Status: DC

## 2022-04-23 NOTE — Discharge Instructions (Addendum)
You are seen the emergency department today for an external hemorrhoid.  Based on our assessment, it appears that he would benefit from a nifedipine ointment that he can apply 4 times daily for pain.  He should still plan on following up with a gastroenterologist for further evaluation and treatment.  I have attached the information for you to contact [facility] gastroenterology here in [place].

## 2022-04-23 NOTE — ED Notes (Signed)
Pt verbalized understanding of all dc instructions 

## 2022-04-23 NOTE — ED Notes (Signed)
Pt unable to afford a way home.  Mother can not drive after 5pm, voucher approved for a taxi

## 2022-04-23 NOTE — ED Provider Notes (Signed)
[facility] EMERGENCY DEPT Provider Note   CSN: [id number] Arrival date & time: 04/23/22  1207     History Chief Complaint  Patient presents with   Hemorrhoids    [name] is a 51 y.o. female.  HPI Patient presents to the emergency department with complaints of rectal pain.  Patient reports she has a known history of external hemorrhoids that were previously evaluated at an urgent care and she was given Colace and Anusol ordered to rule out any symptoms.  She reports that she has not had any symptomatic relief since trying these medications.  She recently had some blood streaks in her bowel movements but denies any significant bleeding rectally.  Denies dizziness, headaches, abdominal pain, nausea, diarrhea.     Home Medications Prior to Admission medications   Medication Sig Start Date End Date Taking? Authorizing Provider  nifedipine 0.3 % ointment Place 1 Application rectally 4 (four) times daily. 04/23/22  Yes Lourdes Sledge A, PA-C  Accu-Chek Softclix Lancets lancets Use as instructed Patient taking differently: 1 each by Other route See admin instructions. Use as instructed 11/29/20   Argentina Donovan, PA-C  acetaminophen (TYLENOL) 500 MG tablet Take 1 tablet (500 mg total) by mouth every 6 (six) hours as needed. Patient taking differently: Take 500 mg by mouth every 6 (six) hours as needed for mild pain. 04/19/20   Ladell Pier, MD  acetaminophen (TYLENOL) 650 MG CR tablet Take 650 mg by mouth every 8 (eight) hours as needed for pain.    [provider]  albuterol (PROAIR HFA) 108 (90 Base) MCG/ACT inhaler INHALE TWO PUFFS BY MOUTH EVERY 6 HOURS AS NEEDED FOR WHEEZING OR SHORTNESS OF BREATH 03/11/22   Shahmehdi, Valeria Batman, MD  amLODipine (NORVASC) 5 MG tablet TAKE 1/2 TABLET BY MOUTH DAILY 02/19/22   Ladell Pier, MD  apixaban (ELIQUIS) 5 MG TABS tablet Take 1 tablet (5 mg total) by mouth 2 (two) times daily. 11/22/21   Ladell Pier, MD  atorvastatin (LIPITOR) 40 MG tablet Take 1 tablet (40 mg total) by mouth daily. 11/22/21   Ladell Pier, MD  Blood Glucose Calibration (ACCU-CHEK GUIDE CONTROL) LIQD 1 each by In Vitro route daily. 12/13/20   Argentina Donovan, PA-C  Blood Glucose Monitoring Suppl (ACCU-CHEK GUIDE) w/Device KIT 1 each by Does not apply route 2 (two) times daily. 11/29/20   Argentina Donovan, PA-C  busPIRone (BUSPAR) 7.5 MG tablet Take 1 tablet by mouth 2 (two) times daily.    [provider]  Carboxymethylcellulose Sodium (EYE DROPS OP) Place 1 drop into both eyes daily as needed (dry eyes).    [provider]  carvedilol (COREG) 25 MG tablet Take 1 tablet (25 mg total) by mouth 2 (two) times daily with a meal. 01/07/22   Ladell Pier, MD  cholecalciferol (VITAMIN D3) 25 MCG (1000 UNIT) tablet Take 1 tablet (1,000 Units total) by mouth daily. 04/07/20   Ladell Pier, MD  FARXIGA 10 MG TABS tablet TAKE 1 TABLET BY MOUTH DAILY 02/11/22   Ladell Pier, MD  FEROSUL 325 (65 Fe) MG tablet TAKE ONE TABLET BY MOUTH DAILY WITH BREAKFAST 06/06/21   Ladell Pier, MD  furosemide (LASIX) 80 MG tablet Take 1 tablet (80 mg total) by mouth daily. Take additional dose of lasix for weight gain, leg swelling 06/01/21   Barb Merino, MD  gabapentin (NEURONTIN) 100 MG capsule Take 1 capsule (100 mg total) by mouth 3 (  three) times daily. 01/07/22   Ladell Pier, MD  glucose blood test strip Use as instructed Patient taking differently: 1 each by Other route See admin instructions. Use as instructed 11/29/20   Argentina Donovan, PA-C  hydrocortisone cream 1 % Apply 1 application topically daily as needed for itching.    [provider]  hydrOXYzine (VISTARIL) 25 MG capsule Take 1 capsule (25 mg total) by mouth at bedtime as needed. Patient taking differently: Take 25 mg by mouth at bedtime as needed for anxiety or itching. 04/20/21   Ladell Pier, MD  methocarbamol  (ROBAXIN) 500 MG tablet Take 1 tablet (500 mg total) by mouth 2 (two) times daily as needed for muscle spasms. 01/07/22   Ladell Pier, MD  methylPREDNISolone (MEDROL DOSEPAK) 4 MG TBPK tablet Medrol Dosepak take as instructed 03/11/22   Shahmehdi, Valeria Batman, MD  mometasone-formoterol (DULERA) 200-5 MCG/ACT AERO INHALE 2 PUFFS BY MOUTH EVERY MORNING AND INHALE TWO PUFFS BY MOUTH EVERY NIGHT AT BEDTIME Patient taking differently: Inhale 2 puffs into the lungs 2 (two) times daily. 01/29/22   Ladell Pier, MD  neomycin-bacitracin-polymyxin [phone] OINT Apply 1 Application topically 2 (two) times daily. 03/11/22   Shahmehdi, Valeria Batman, MD  nitroGLYCERIN (NITROSTAT) 0.4 MG SL tablet Place 1 tablet (0.4 mg total) under the tongue every 5 (five) minutes as needed for chest pain. 11/22/21   Ladell Pier, MD  PARoxetine (PAXIL) 20 MG tablet Take 1 tablet (20 mg total) by mouth daily. 11/22/21   Ladell Pier, MD  potassium chloride SA (KLOR-CON M20) 20 MEQ tablet Take 1 tablet (20 mEq total) by mouth daily. 11/22/21   Ladell Pier, MD  Semaglutide, 1 MG/DOSE, 4 MG/3ML SOPN Inject 1 mg as directed once a week. 02/04/22   Ladell Pier, MD  spironolactone (ALDACTONE) 25 MG tablet Take 1 tablet (25 mg total) by mouth daily. 01/07/22   Ladell Pier, MD  vitamin B-12 (CYANOCOBALAMIN) 1000 MCG tablet Take 1,000 mcg by mouth daily.    [provider]      Allergies    Ace inhibitors, Aspirin, Hydromorphone, Vancomycin, Contrast media [iodinated contrast media], Dilaudid [hydromorphone hcl], and Lidocaine    Review of Systems   Review of Systems  Constitutional:  Negative for chills, fatigue and fever.  Gastrointestinal:  Positive for rectal pain. Negative for abdominal pain, anal bleeding, blood in stool and constipation.  All other systems reviewed and are negative.   Physical Exam Updated Vital Signs BP (!) 134/100   Pulse 89   Temp 98 F (36.7 C) (Oral)    Resp 18   Ht 5' 2" (1.575 m)   Wt (!) 140.2 kg   SpO2 97%   BMI 56.52 kg/m  Physical Exam Vitals and nursing note reviewed.  Constitutional:      Appearance: Normal appearance.  HENT:     Head: Normocephalic and atraumatic.  Eyes:     Conjunctiva/sclera: Conjunctivae normal.  Cardiovascular:     Rate and Rhythm: Normal rate and regular rhythm.     Pulses: Normal pulses.     Heart sounds: Normal heart sounds.  Pulmonary:     Effort: Pulmonary effort is normal.     Breath sounds: Normal breath sounds.  Genitourinary:      Comments: Possible external hemorrhoid or perianal skin tag.  No obviously thrombosed send no evidence of any bleeding per rectum. Skin:    General: Skin is warm and dry.  Capillary Refill: Capillary refill takes less than 2 seconds.  Neurological:     Mental Status: She is alert.     ED Results / Procedures / Treatments   Labs (all labs ordered are listed, but only abnormal results are displayed) Labs Reviewed - No data to display  EKG None  Radiology No results found.  Procedures Procedures   Medications Ordered in ED Medications - No data to display  ED Course/ Medical Decision Making/ A&P                           Medical Decision Making  This patient presents to the ED for concern of rectal pain. Differential diagnosis includes external hemorrhoid, internal hemorrhoid, thrombosed hemorrhoid, or perirectal tenderness   Medicines ordered and prescription drug management:  I have reviewed the patients home medicines and have made adjustments as needed   Problem List / ED Course:  Patient presented to the emergency department with complaints of hemorrhoids.  She was previously noted at urgent care and diagnosed with external hemorrhoids and given a prescription for Anusol and Colace which did not improve symptoms over the last 2 weeks.  Given that physical exam was reassuring with no obvious thrombosed hemorrhoid seen or even a  large hemorrhoid visible, advised patient that she is safe to discharge home with a topical nifedipine prescription that she can apply daily up to 4 times as needed for pain.  I discussed possibility of using topical lidocaine for anesthetic effect patient reports that she has a sensitivity to lidocaine which causes generalized itchiness so I would not be prescribed this medication.  Encourage patient that she should be evaluated by gastroenterology for further workup and definitive treatment in the outpatient setting.   Final Clinical Impression(s) / ED Diagnoses Final diagnoses:  External hemorrhoid    Rx / DC Orders ED Discharge Orders          Ordered    nifedipine 0.3 % ointment  4 times daily        04/23/22 1933              Luvenia Heller, PA-C 04/23/22 1945    Lennice Sites, DO 04/23/22 2330

## 2022-04-23 NOTE — ED Triage Notes (Signed)
Patient reports hemorrhoids x 2 weeks. Patient reports going to UC on 21st and given colace and anusol suppository. Patient states the pain has continued and not improving. States the UC did not give her the information for GI to follow up with.

## 2022-05-02 ENCOUNTER — Other Ambulatory Visit: Payer: Self-pay | Admitting: Internal Medicine

## 2022-05-02 NOTE — Telephone Encounter (Signed)
Medication Refill - Medication: Semaglutide, 1 MG/DOSE, 4 MG/3ML SOPN   Has the patient contacted their pharmacy? No.  Pharmacy advised pt to contact her PCP  Preferred Pharmacy (with phone number or street name):  [name] [id number] - [place], [address] Phone: [phone]  Fax: [phone]     Has the patient been seen for an appointment in the last year OR does the patient have an upcoming appointment? Yes.    Agent: Please be advised that RX refills may take up to 3 business days. We ask that you follow-up with your pharmacy.

## 2022-05-02 NOTE — Telephone Encounter (Signed)
Unable to refill per protocol, last refill by provider 05/02/21. Will refuse duplicate request.  Requested Prescriptions  Pending Prescriptions Disp Refills   Semaglutide, 1 MG/DOSE, 4 MG/3ML SOPN 3 mL 2    Sig: Inject 1 mg as directed once a week.     Endocrinology:  Diabetes - GLP-1 Receptor Agonists - semaglutide Passed - 05/02/2022  1:26 PM      Passed - HBA1C in normal range and within 180 days    HbA1c, POC (prediabetic range)  Date Value Ref Range Status  04/07/2020 5.7 5.7 - 6.4 % Final   HbA1c, POC (controlled diabetic range)  Date Value Ref Range Status  11/22/2021 6.4 0.0 - 7.0 % Final         Passed - Cr in normal range and within 360 days    Creatinine  Date Value Ref Range Status  03/29/2022 0.64 0.44 - 1.00 mg/dL Final         Passed - Valid encounter within last 6 months    Recent Outpatient Visits           2 months ago Type 2 diabetes mellitus with morbid obesity [facility])   [place], Jarome Matin, RPH-CPP   3 months ago [place] [name] B, MD   5 months ago Type 2 diabetes mellitus with morbid obesity [facility])   [place] Ladell Pier, MD   6 months ago Medication refill   [place], MD   1 year ago Chronic bilateral thoracic back pain   [facility], Kriste Basque, NP       Future Appointments             In 5 days Ladell Pier, MD [place]

## 2022-05-03 ENCOUNTER — Encounter (HOSPITAL_BASED_OUTPATIENT_CLINIC_OR_DEPARTMENT_OTHER): Payer: Medicaid Other | Admitting: General Surgery

## 2022-05-06 ENCOUNTER — Encounter (HOSPITAL_BASED_OUTPATIENT_CLINIC_OR_DEPARTMENT_OTHER): Payer: Medicaid Other | Admitting: General Surgery

## 2022-05-06 ENCOUNTER — Other Ambulatory Visit: Payer: Medicaid Other | Admitting: Obstetrics and Gynecology

## 2022-05-06 NOTE — Patient Outreach (Signed)
  Medicaid Managed Care   Unsuccessful Attempt Note   05/06/2022 Name: Pola Furno MRN: [id number] DOB: [birth year]  Referred by: Ladell Pier, MD Reason for referral : High Risk Managed Medicaid (Unsuccessful telephone outreach)   A second unsuccessful telephone outreach was attempted today. The patient was referred to the case management team for assistance with care management and care coordination.    Follow Up Plan: The Managed Medicaid care management team will reach out to the patient again over the next 30 business  days. and The  Patient has been provided with contact information for the Managed Medicaid care management team and has been advised to call with any health related questions or concerns.    [name], BSN [place]  [facility] - Managed Medicaid High Risk [phone]

## 2022-05-06 NOTE — Patient Instructions (Signed)
Hi [name]- as a part of your Medicaid benefit, you are eligible for care management and care coordination services at no cost or copay. I was unable to reach you by phone today but would be happy to help you with your health related needs. Please feel free to call me at [phone]  A member of the Managed Medicaid care management team will reach out to you again over the next 30 business  days.   [name], BSN [place]  [facility] - Managed Medicaid High Risk [phone]

## 2022-05-07 ENCOUNTER — Encounter: Payer: Self-pay | Admitting: Internal Medicine

## 2022-05-07 ENCOUNTER — Ambulatory Visit: Payer: Medicaid Other | Attending: Internal Medicine | Admitting: Internal Medicine

## 2022-05-07 ENCOUNTER — Ambulatory Visit: Payer: Medicaid Other | Admitting: Dietician

## 2022-05-07 DIAGNOSIS — M79631 Pain in right forearm: Secondary | ICD-10-CM

## 2022-05-07 DIAGNOSIS — I152 Hypertension secondary to endocrine disorders: Secondary | ICD-10-CM

## 2022-05-07 DIAGNOSIS — E1159 Type 2 diabetes mellitus with other circulatory complications: Secondary | ICD-10-CM | POA: Diagnosis not present

## 2022-05-07 DIAGNOSIS — K5909 Other constipation: Secondary | ICD-10-CM | POA: Diagnosis not present

## 2022-05-07 DIAGNOSIS — E1169 Type 2 diabetes mellitus with other specified complication: Secondary | ICD-10-CM

## 2022-05-07 DIAGNOSIS — K921 Melena: Secondary | ICD-10-CM

## 2022-05-07 DIAGNOSIS — F321 Major depressive disorder, single episode, moderate: Secondary | ICD-10-CM

## 2022-05-07 DIAGNOSIS — J4540 Moderate persistent asthma, uncomplicated: Secondary | ICD-10-CM | POA: Diagnosis not present

## 2022-05-07 DIAGNOSIS — I5032 Chronic diastolic (congestive) heart failure: Secondary | ICD-10-CM

## 2022-05-07 DIAGNOSIS — M79632 Pain in left forearm: Secondary | ICD-10-CM | POA: Diagnosis not present

## 2022-05-07 LAB — POCT GLYCOSYLATED HEMOGLOBIN (HGB A1C): HbA1c, POC (controlled diabetic range): 5.9 % (ref 0.0–7.0)

## 2022-05-07 LAB — GLUCOSE, POCT (MANUAL RESULT ENTRY): POC Glucose: 88 mg/dl (ref 70–99)

## 2022-05-07 MED ORDER — POLYETHYLENE GLYCOL 3350 17 GM/SCOOP PO POWD: 17.0000 g | Freq: Every day | ORAL | 1 refills | Status: DC | PRN

## 2022-05-07 MED ORDER — OZEMPIC (1 MG/DOSE) 4 MG/3ML ~~LOC~~ SOPN: PEN_INJECTOR | SUBCUTANEOUS | 1 refills | Status: DC

## 2022-05-07 MED ORDER — DICLOFENAC SODIUM 1 % EX GEL: 2.0000 g | Freq: Four times a day (QID) | CUTANEOUS | 1 refills | Status: DC

## 2022-05-07 NOTE — Progress Notes (Signed)
Patient ID: [name], female    DOB: 06/03/1970  MRN: [id number]  CC: Diabetes (DM f/u. Med refill - insulin, testing strips/Pain from rectum, strings of blood in stool. Janene Harvey on L side X1 mo)   Subjective: [name] is a 52 y.o. 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  EGD and colonoscopy negative, chronic ulcer left lower extremity,    Complains of seeing streaks of blood in her stools x 1 mth. Seen at [facility] on [address] 04/18/2022 for this. Told dhe had hemorrhoid.  Given Colace and Anusol supp Had abd x-ray without significant findings Seen in the emergency room for the same at the end of last month.  Found to have possible external hemorrhoid versus perianal skin tag but no thrombosed hemorrhoids seen.  Given some topical nifedipine to apply up to 4 times a day as needed. Not covered by insurance BM are hard.  Has increase fiber in diet H/H was 11.5/37.5 which is stable for her. Had c-scope 11/2019 through [facility] in [place].  I can see where she had EGD/c-scope done through [facility] but I can not see the results.   she does not recall having the c-scope done  Saw [name] 03/29/2022 for iron deficiency anemia.  Hemoglobin was stable.  Ferritin level was 304 with iron of 50 and TIBC of 290.  No indication for additional IV iron at the time.  Told to follow-up in 6 months.  DM/obesity: Results for orders placed or performed in visit on 05/07/22  POCT glucose (manual entry)  Result Value Ref Range   POC Glucose 88 70 - 99 mg/dl  POCT glycosylated hemoglobin (Hb A1C)  Result Value Ref Range   Hemoglobin A1C     HbA1c  POC (<> result, manual entry)     HbA1c, POC (prediabetic range)     HbA1c, POC (controlled diabetic range) 5.9 0.0 - 7.0 %  Wgh down from 340s to 314 lbs today Tolerating Ozempic.  It has dec appetite Wants glucometer to check BS On Ozempic 1 mg Q wk and Farxiga 10 mg daily Has f/u with nutritionist today Walks around her neighborhood and using gym 2x/wk  Since last visit with me, patient was hospitalized in November with flu that exacerbated her asthma and CHF.  Treated with Tamiflu. Remains on Dulera. Breathing back to baseline Uses albuterol intermittently.  HTN/CHF/CAD: Doing well on meds that include Norvasc 2.5 mg daily, Coreg 25 mg twice a day, furosemide 80 mg and spironolactone 25 mg No LE edema, CP at this time.  No PND  Wants arthritis med for soreness forearms x few days.  No swelling.  Taking Tylenol arthritis which helps  Dep: doing well on Paxil 20 mg  Ulcer on LLE has resolved. Patient Active Problem List   Diagnosis Date Noted   Moderate major depression ([place]) 05/07/2022   Influenza A 03/06/2022   COPD with acute exacerbation ([place]) 03/05/2022   Hyperlipidemia with target LDL less than 100 02/17/2022   Pain in thoracic spine 09/25/2021   Low back pain 09/25/2021   OSA (obstructive sleep apnea) 07/11/2021   Depression 05/28/2021   Encounter for care related to  Port-a-Cath 05/27/2021   Morbid obesity with BMI of 50.0-59.9, adult ([place]) 05/26/2021   Fever 11/22/2020   Bilateral lower extremity edema 11/22/2020   History of DVT (deep vein thrombosis) 11/22/2020   Asthma, chronic, moderate persistent, with acute exacerbation 11/22/2020   Lower limb ulcer, calf, left, limited to breakdown of skin ([place]) 06/09/2020   Lymphedema 06/09/2020   Iron deficiency anemia due to chronic blood loss 05/10/2020   Venous insufficiency 04/13/2020   S/P insertion of IVC (inferior vena caval) filter 04/13/2020   Chronic anemia 04/13/2020   Essential hypertension 04/13/2020    (HFpEF) heart failure with preserved ejection fraction (HCC) -> although echo suggests normal diastolic parameters with normal left atrial size 04/13/2020   CAD (coronary artery disease) 11/17/2019     Current Outpatient Medications on File Prior to Visit  Medication Sig Dispense Refill   Accu-Chek Softclix Lancets lancets Use as instructed (Patient taking differently: 1 each by Other route See admin instructions. Use as instructed) 100 each 12   acetaminophen (TYLENOL) 500 MG tablet Take 1 tablet (500 mg total) by mouth every 6 (six) hours as needed. (Patient taking differently: Take 500 mg by mouth every 6 (six) hours as needed for mild pain.) 60 tablet 0   acetaminophen (TYLENOL) 650 MG CR tablet Take 650 mg by mouth every 8 (eight) hours as needed for pain.     albuterol (PROAIR HFA) 108 (90 Base) MCG/ACT inhaler INHALE TWO PUFFS BY MOUTH EVERY 6 HOURS AS NEEDED FOR WHEEZING OR SHORTNESS OF BREATH 8.5 g 0   amLODipine (NORVASC) 5 MG tablet TAKE 1/2 TABLET BY MOUTH DAILY 45 tablet 1   apixaban (ELIQUIS) 5 MG TABS tablet Take 1 tablet (5 mg total) by mouth 2 (two) times daily. 180 tablet 1   atorvastatin (LIPITOR) 40 MG tablet Take 1 tablet (40 mg total) by mouth daily. 90 tablet 1   Blood Glucose Calibration (ACCU-CHEK GUIDE CONTROL) LIQD 1 each by In Vitro route daily. 1 each 4   Blood Glucose Monitoring Suppl (ACCU-CHEK GUIDE) w/Device KIT 1 each by Does not apply route 2 (two) times daily. 1 kit 0   busPIRone (BUSPAR) 7.5 MG tablet Take 1 tablet by mouth 2 (two) times daily.     Carboxymethylcellulose Sodium (EYE DROPS OP) Place 1 drop into both eyes daily as needed (dry eyes).     carvedilol (COREG) 25 MG tablet Take 1 tablet (25 mg total) by mouth 2 (two) times daily with a meal. 60 tablet 3   cholecalciferol (VITAMIN D3) 25 MCG (1000 UNIT) tablet Take 1 tablet (1,000 Units total) by mouth daily. 30 tablet 5   FARXIGA 10 MG TABS tablet TAKE 1 TABLET BY MOUTH DAILY 90 tablet 0   FEROSUL  325 (65 Fe) MG tablet TAKE ONE TABLET BY MOUTH DAILY WITH BREAKFAST 100 tablet 0   furosemide (LASIX) 80 MG tablet Take 1 tablet (80 mg total) by mouth daily. Take additional dose of lasix for weight gain, leg swelling 30 tablet 4   gabapentin (NEURONTIN) 100 MG capsule Take 1 capsule (100 mg total) by mouth 3 (three) times daily. 90 capsule 3   glucose blood test strip Use as instructed (Patient taking differently: 1 each by Other route See admin instructions. Use as instructed) 100 each 12   hydrocortisone cream 1 % Apply 1 application topically daily as needed for itching.     hydrOXYzine (VISTARIL) 25 MG capsule Take 1 capsule (25 mg total) by mouth at bedtime as needed. (  Patient taking differently: Take 25 mg by mouth at bedtime as needed for anxiety or itching.) 30 capsule 1   methocarbamol (ROBAXIN) 500 MG tablet Take 1 tablet (500 mg total) by mouth 2 (two) times daily as needed for muscle spasms. 60 tablet 1   methylPREDNISolone (MEDROL DOSEPAK) 4 MG TBPK tablet Medrol Dosepak take as instructed 21 tablet 0   mometasone-formoterol (DULERA) 200-5 MCG/ACT AERO INHALE 2 PUFFS BY MOUTH EVERY MORNING AND INHALE TWO PUFFS BY MOUTH EVERY NIGHT AT BEDTIME (Patient taking differently: Inhale 2 puffs into the lungs 2 (two) times daily.) 13 g 3   neomycin-bacitracin-polymyxin [phone] OINT Apply 1 Application topically 2 (two) times daily. 15 g 0   nifedipine 0.3 % ointment Place 1 Application rectally 4 (four) times daily. 30 g 0   nitroGLYCERIN (NITROSTAT) 0.4 MG SL tablet Place 1 tablet (0.4 mg total) under the tongue every 5 (five) minutes as needed for chest pain. 30 tablet 0   PARoxetine (PAXIL) 20 MG tablet Take 1 tablet (20 mg total) by mouth daily. 30 tablet 3   potassium chloride SA (KLOR-CON M20) 20 MEQ tablet Take 1 tablet (20 mEq total) by mouth daily. 30 tablet 4   spironolactone (ALDACTONE) 25 MG tablet Take 1 tablet (25 mg total) by mouth daily. 30 tablet 3   vitamin B-12  (CYANOCOBALAMIN) 1000 MCG tablet Take 1,000 mcg by mouth daily.     No current facility-administered medications on file prior to visit.    Allergies  Allergen Reactions   Ace Inhibitors Rash and Other (See Comments)    Make pt bleed   Aspirin Other (See Comments)    Per patient paperwork: blood clot?  Likely because of chronic DOAC   Hydromorphone Hives and Itching   Vancomycin Itching and Rash   Contrast Media [Iodinated Contrast Media] Hives   Dilaudid [Hydromorphone Hcl] Hives   Lidocaine Itching    Itching with Lidocaine patch reported 06/13/2021 vis telephone message.    Social History   Socioeconomic History   Marital status: Single    Spouse name: Not on file   Number of children: 0   Years of education: Not on file   Highest education level: 12th grade  Occupational History   Occupation: unemployed on disablity  Tobacco Use   Smoking status: Former    Packs/day: 0.25    Years: 18.00    Total pack years: 4.50    Types: Cigarettes    Quit date: 04/29/2002    Years since quitting: 20.0   Smokeless tobacco: Never  Vaping Use   Vaping Use: Never used  Substance and Sexual Activity   Alcohol use: Not Currently   Drug use: Not Currently   Sexual activity: Not Currently  Other Topics Concern   Not on file  Social History Narrative   Not on file   Social Determinants of Health   Financial Resource Strain: Low Risk  (09/06/2021)   Overall Financial Resource Strain (CARDIA)    Difficulty of Paying Living Expenses: Not very hard  Food Insecurity: No Food Insecurity (03/10/2022)   Hunger Vital Sign    Worried About Running Out of Food in the Last Year: Never true    Ran Out of Food in the Last Year: Never true  Transportation Needs: No Transportation Needs (03/10/2022)   PRAPARE - Hydrologist (Medical): No    Lack of Transportation (Non-Medical): No  Physical Activity: Sufficiently Active (12/14/2021)   Exercise Vital Sign  Days of  Exercise per Week: 7 days    Minutes of Exercise per Session: 30 min  Stress: Stress Concern Present (02/27/2022)   [place]    Feeling of Stress : To some extent  Social Connections: Socially Isolated (09/06/2021)   Social Connection and Isolation Panel [NHANES]    Frequency of Communication with Friends and Family: More than three times a week    Frequency of Social Gatherings with Friends and Family: More than three times a week    Attends Religious Services: Never    Marine scientist or Organizations: No    Attends Archivist Meetings: Never    Marital Status: Never married  Intimate Partner Violence: Not At Risk (03/10/2022)   Humiliation, Afraid, Rape, and Kick questionnaire    Fear of Current or Ex-Partner: No    Emotionally Abused: No    Physically Abused: No    Sexually Abused: No    Family History  Problem Relation Age of Onset   Diabetes Mellitus II Mother    COPD Father    Diabetes Father    Diabetes Mellitus II Maternal Grandmother    Breast cancer Paternal Grandfather     Past Surgical History:  Procedure Laterality Date   ABDOMINAL WALL DEFECT REPAIR  1970   IR CV LINE INJECTION  10/24/2020   IR REMOVAL TUN ACCESS W/ PORT W/O FL MOD SED  05/28/2021   IVC FILTER INSERTION  2017   Lower Extremity Venous Duplex  06/23/2020   No evidence of DVT or superficial thrombosis bilaterally.  No evidence of deep venous insufficiency bilaterally.  No evidence of SSV reflux.  Right GSV in the calf has reflux, no reflux in L GSV.;  Repeated in July 2020-no DVT   [place]  2014   TRANSTHORACIC ECHOCARDIOGRAM  05/18/2021   EF 60 to 65%.  No RWMA.  Mild concentric LVH.  "Normal diastolic parameters ".  Normal longitudinal strain.  Normal PAP, RAP.  Normal aortic and mitral valves.==> In July 2022, echo read as GR 1 DD otherwise stable.     ROS: Review of Systems Negative except as stated above  PHYSICAL EXAM: BP 123/82 (BP Location: Right Arm, Patient Position: Sitting, Cuff Size: Large)   Pulse (!) 102   Temp 98.1 F (36.7 C) (Oral)   Ht 5\' 2"  (1.575 m)   Wt (!) 314 lb (142.4 kg)   SpO2 98%   BMI 57.43 kg/m   Wt Readings from Last 3 Encounters:  05/07/22 (!) 314 lb (142.4 kg)  04/23/22 (!) 309 lb (140.2 kg)  03/29/22 (!) 309 lb 4.8 oz (140.3 kg)    Physical Exam  General appearance - alert, well appearing, and in no distress Mental status - normal mood, behavior, speech, dress, motor activity, and thought processes Chest - clear to auscultation, no wheezes, rales or rhonchi, symmetric air entry Heart - normal rate, regular rhythm, normal S1, S2, no murmurs, rubs, clicks or gallops Musculoskeletal - No edema or erythema of forearms.  Mild tenderness upper forearms close to antecubital fossa BL Extremities - No LE edema     05/07/2022    1:49 PM 11/22/2021    2:10 PM 08/21/2021   11:05 AM  Depression screen PHQ 2/9  Decreased Interest 0 3 0  Down, Depressed, Hopeless 0 3 1  PHQ - 2 Score 0 6 1  Altered sleeping  0 1   Tired, decreased energy 1 1   Change in appetite 0 1   Feeling bad or failure about yourself  0 3   Trouble concentrating 0 3   Moving slowly or fidgety/restless 0 3   Suicidal thoughts 0 3   PHQ-9 Score 1 21        Latest Ref Rng & Units 03/29/2022    1:01 PM 03/07/2022    3:29 AM 03/05/2022    4:18 PM  CMP  Glucose 70 - 99 mg/dL 131  161  91   BUN 6 - 20 mg/dL 7  14  7    Creatinine 0.44 - 1.00 mg/dL 0.64  0.71  0.74   Sodium 135 - 145 mmol/L 141  139  140   Potassium 3.5 - 5.1 mmol/L 3.7  4.4  3.3   Chloride 98 - 111 mmol/L 105  102  102   CO2 22 - 32 mmol/L 29  30  29    Calcium 8.9 - 10.3 mg/dL 9.7  9.1  9.2   Total Protein 6.5 - 8.1 g/dL 7.7     Total Bilirubin 0.3 - 1.2 mg/dL 1.0     Alkaline Phos 38 - 126 U/L 66     AST 15 - 41 U/L 11     ALT 0 - 44 U/L 10      Lipid  Panel     Component Value Date/Time   CHOL 174 11/22/2021 1506   TRIG 99 11/22/2021 1506   HDL 52 11/22/2021 1506   CHOLHDL 3.3 11/22/2021 1506   LDLCALC 104 (H) 11/22/2021 1506    CBC    Component Value Date/Time   WBC 5.9 03/29/2022 1301   WBC 3.4 (L) 03/07/2022 0329   RBC 4.13 03/29/2022 1301   HGB 11.5 (L) 03/29/2022 1301   HGB 12.3 01/31/2022 1341   HCT 37.5 03/29/2022 1301   HCT 38.8 01/31/2022 1341   PLT 101 (L) 03/29/2022 1301   PLT 109 (L) 01/31/2022 1341   MCV 90.8 03/29/2022 1301   MCV 87 01/31/2022 1341   MCH 27.8 03/29/2022 1301   MCHC 30.7 03/29/2022 1301   RDW 14.6 03/29/2022 1301   RDW 12.3 01/31/2022 1341   LYMPHSABS 0.7 03/29/2022 1301   LYMPHSABS 0.5 (L) 11/29/2020 1445   MONOABS 0.4 03/29/2022 1301   EOSABS 0.1 03/29/2022 1301   EOSABS 0.0 11/29/2020 1445   BASOSABS 0.0 03/29/2022 1301   BASOSABS 0.0 11/29/2020 1445    ASSESSMENT AND PLAN: 1. Type 2 diabetes mellitus with morbid obesity ([place]) Patient has lost almost 30 pounds so far on Ozempic and [place].  She is tolerating the medication well without nausea, vomiting, diarrhea or abdominal pain.  She will continue the Ozempic at the current dose of 1 mg.  Encourage healthy eating habits.  Encouraged her to continue to move as much as she can. Prescription sent to her pharmacy for diabetic testing supplies. - POCT glucose (manual entry) - POCT glycosylated hemoglobin (Hb A1C) - Semaglutide, 1 MG/DOSE, (OZEMPIC, 1 MG/DOSE,) 4 MG/3ML SOPN; DIAL AND INJECT UNDER THE SKIN 1 MG WEEKLY  Dispense: 3 mL; Refill: 1 - Microalbumin / creatinine urine ratio  2. Blood in stool [place] importance of keeping bowel movements soft and regular.  Increase fiber in the diet by eating more green leafy vegetables.  I have given her prescription for MiraLAX. - Ambulatory referral to Gastroenterology  3. Other constipation See #2 above. - polyethylene glycol powder (GLYCOLAX/MIRALAX) 17 GM/SCOOP powder;  Take 17 g by  mouth daily as needed for mild constipation.  Dispense: 3350 g; Refill: 1  4. Chronic heart failure with preserved ejection fraction (HCC) Stable and compensated.  Continue carvedilol, furosemide, spironolactone  5. Hypertension associated with diabetes ([place]) Controlled on current medications.  Continue Norvasc, carvedilol  6. Moderate major depression (HCC) PHQ-9 score has improved.  Doing well on Paxil.  7. Asthma, moderate persistent, well-controlled Stable on Dulera.  8. Pain in both forearms Seems to be more in the soft tissue.  We will try her with Voltaren gel. - diclofenac Sodium (VOLTAREN) 1 % GEL; Apply 2 g topically 4 (four) times daily.  Dispense: 100 g; Refill: 1    Patient was given the opportunity to ask questions.  Patient verbalized understanding of the plan and was able to repeat key elements of the plan.   This documentation was completed using Radio producer.  Any transcriptional errors are unintentional.  Orders Placed This Encounter  Procedures   Microalbumin / creatinine urine ratio   Ambulatory referral to Gastroenterology   POCT glucose (manual entry)   POCT glycosylated hemoglobin (Hb A1C)     Requested Prescriptions   Signed Prescriptions Disp Refills   polyethylene glycol powder (GLYCOLAX/MIRALAX) 17 GM/SCOOP powder 3350 g 1    Sig: Take 17 g by mouth daily as needed for mild constipation.   diclofenac Sodium (VOLTAREN) 1 % GEL 100 g 1    Sig: Apply 2 g topically 4 (four) times daily.   Semaglutide, 1 MG/DOSE, (OZEMPIC, 1 MG/DOSE,) 4 MG/3ML SOPN 3 mL 1    Sig: DIAL AND INJECT UNDER THE SKIN 1 MG WEEKLY    Return in about 4 months (around 09/05/2022).  [name], MD, FACP

## 2022-05-08 LAB — MICROALBUMIN / CREATININE URINE RATIO
Creatinine, Urine: 165.8 mg/dL
Microalb/Creat Ratio: 17 mg/g creat (ref 0–29)
Microalbumin, Urine: 28.6 ug/mL

## 2022-05-09 ENCOUNTER — Telehealth: Payer: Self-pay | Admitting: Internal Medicine

## 2022-05-09 ENCOUNTER — Encounter (HOSPITAL_COMMUNITY): Payer: Self-pay

## 2022-05-09 ENCOUNTER — Telehealth (HOSPITAL_COMMUNITY): Payer: Self-pay

## 2022-05-09 DIAGNOSIS — E1165 Type 2 diabetes mellitus with hyperglycemia: Secondary | ICD-10-CM

## 2022-05-09 MED ORDER — ACCU-CHEK GUIDE VI STRP: ORAL_STRIP | 2 refills | Status: DC

## 2022-05-09 MED ORDER — ACCU-CHEK GUIDE W/DEVICE KIT: PACK | 0 refills | Status: DC

## 2022-05-09 MED ORDER — ACCU-CHEK SOFTCLIX LANCETS MISC: 2 refills | Status: DC

## 2022-05-09 MED ORDER — ACCU-CHEK GUIDE W/DEVICE KIT: 1.0000 | PACK | Freq: Two times a day (BID) | 0 refills | Status: DC

## 2022-05-09 NOTE — Telephone Encounter (Signed)
Patient states that during her visit on 05/07/22 with pcp she was told a new rx for a glucose meter was being sent in. No rx list in chart for meter. Patient would like the meter be sent to  [id number] - [place], [address] Phone: [phone]  Fax: [phone]

## 2022-05-09 NOTE — Telephone Encounter (Signed)
Pt insurance is active and benefits verified through Medicaid. Co-pay $4.00, DED $0.00/$0.00 met, out of pocket $0.00/$0.00 met, co-insurance 0%. No pre-authorization required. [place] [employer] Medicaid, 05/09/22 @ 3:5PM, CEY#[id number]

## 2022-05-09 NOTE — Telephone Encounter (Signed)
Attempted to call patient in regards to Pulmonary Rehab - LM on VM Mailed letter 

## 2022-05-09 NOTE — Addendum Note (Signed)
Addended by: Carilyn Goodpasture on: 05/09/2022 02:13 PM   Modules accepted: Orders

## 2022-05-09 NOTE — Telephone Encounter (Signed)
Sent meter and supplies to pharmacy as requested.

## 2022-05-13 ENCOUNTER — Telehealth (HOSPITAL_COMMUNITY): Payer: Self-pay

## 2022-05-13 NOTE — Telephone Encounter (Signed)
Pt returned PR phone call and stated she is interested in PR. Patient will come in for orientation on 05/17/22 @ 9AM and will attend the 1:15PM exercise class. Went over insurance, patient verbalized understanding.   Tourist information centre manager.

## 2022-05-14 ENCOUNTER — Telehealth (HOSPITAL_COMMUNITY): Payer: Self-pay

## 2022-05-14 NOTE — Telephone Encounter (Signed)
Pt called and left a VM stating she would have to cancel PR orientation b/c she has the flu.(Appts canceled in Epic)   Attempted to contact pt to reschedule, LMTCB

## 2022-05-15 ENCOUNTER — Other Ambulatory Visit: Payer: Medicaid Other | Admitting: Obstetrics and Gynecology

## 2022-05-15 NOTE — Patient Outreach (Cosign Needed)
Care Coordination  05/15/2022  [name] 1971/04/13 [id number]   Medicaid Managed Care   Unsuccessful Outreach Note  05/15/2022 Name: [name] MRN: [id number] DOB: 07-Aug-1970  Referred by: Ladell Pier, MD Reason for referral : High Risk Managed Medicaid (Unsuccessful telephone outreach)   Third unsuccessful telephone outreach was attempted today. The patient was referred to the case management team for assistance with care management and care coordination. The patient's primary care provider has been notified of our unsuccessful attempts to make or maintain contact with the patient. The care management team is pleased to engage with this patient at any time in the future should he/she be interested in assistance from the care management team.   Follow Up Plan: The patient has been provided with contact information for the care management team and has been advised to call with any health related questions or concerns.  We have been unable to make contact with the patient for follow up. The care management team is available to follow up with the patient after provider conversation with the patient regarding recommendation for care management engagement and subsequent re-referral to the care management team.   [name], BSN [place] Management Coordinator - Managed [facility] High Risk [phone].

## 2022-05-15 NOTE — Patient Instructions (Signed)
Hi [name], I am sorry we have missed you again today - as a part of your Medicaid benefit, you are eligible for care management and care coordination services at no cost or copay. I was unable to reach you by phone today but would be happy to help you with your health related needs. Please feel free to call me at [phone].  [name], BSN [place]  [facility] Curator - Managed Medicaid High Risk [phone]

## 2022-05-16 DIAGNOSIS — G4733 Obstructive sleep apnea (adult) (pediatric): Secondary | ICD-10-CM | POA: Diagnosis not present

## 2022-05-17 ENCOUNTER — Ambulatory Visit (HOSPITAL_COMMUNITY): Payer: Medicaid Other

## 2022-05-21 ENCOUNTER — Ambulatory Visit (HOSPITAL_BASED_OUTPATIENT_CLINIC_OR_DEPARTMENT_OTHER): Payer: Medicaid Other | Admitting: General Surgery

## 2022-05-21 ENCOUNTER — Ambulatory Visit (HOSPITAL_COMMUNITY): Payer: Medicaid Other

## 2022-05-23 ENCOUNTER — Ambulatory Visit (HOSPITAL_COMMUNITY): Payer: Medicaid Other

## 2022-05-23 DIAGNOSIS — G4733 Obstructive sleep apnea (adult) (pediatric): Secondary | ICD-10-CM | POA: Diagnosis not present

## 2022-05-28 ENCOUNTER — Ambulatory Visit (HOSPITAL_COMMUNITY): Payer: Medicaid Other

## 2022-05-30 ENCOUNTER — Ambulatory Visit (HOSPITAL_COMMUNITY): Payer: Medicaid Other

## 2022-06-02 ENCOUNTER — Other Ambulatory Visit: Payer: Self-pay | Admitting: Internal Medicine

## 2022-06-02 DIAGNOSIS — Z8709 Personal history of other diseases of the respiratory system: Secondary | ICD-10-CM

## 2022-06-02 DIAGNOSIS — F321 Major depressive disorder, single episode, moderate: Secondary | ICD-10-CM

## 2022-06-04 ENCOUNTER — Ambulatory Visit (HOSPITAL_COMMUNITY): Payer: Medicaid Other

## 2022-06-06 ENCOUNTER — Ambulatory Visit (HOSPITAL_COMMUNITY): Payer: Medicaid Other

## 2022-06-07 ENCOUNTER — Other Ambulatory Visit: Payer: Self-pay | Admitting: Internal Medicine

## 2022-06-07 DIAGNOSIS — I10 Essential (primary) hypertension: Secondary | ICD-10-CM

## 2022-06-07 DIAGNOSIS — I5032 Chronic diastolic (congestive) heart failure: Secondary | ICD-10-CM

## 2022-06-10 ENCOUNTER — Encounter: Payer: Self-pay | Admitting: Dietician

## 2022-06-10 ENCOUNTER — Encounter: Payer: Medicaid Other | Attending: Internal Medicine | Admitting: Dietician

## 2022-06-10 ENCOUNTER — Telehealth: Payer: Self-pay | Admitting: Emergency Medicine

## 2022-06-10 VITALS — Wt 306.0 lb

## 2022-06-10 DIAGNOSIS — E118 Type 2 diabetes mellitus with unspecified complications: Secondary | ICD-10-CM | POA: Insufficient documentation

## 2022-06-10 NOTE — Progress Notes (Signed)
Medical Nutrition Therapy  Appointment Start time:  [phone]  Appointment End time:  0940 She was last seen by this RD on 02/04/2022 Primary concerns today:  She would like to lose weight.  She states that she did not qualify for weight loss surgery due to high risk. [name] Referral diagnosis: Type 2 Diabetes with obesity Preferred learning style: no preference indicated Learning readiness: ready  NUTRITION ASSESSMENT  Eating more salads, fish, chicken, beans, and less heavy meals. Drinking soy milk. Walking 1 hour per day when the weather is nice.  Walking up hills is difficult but she does it. Going to the gym on the treadmill 3 times per week for about 30 minutes. Recent lab work and last A1C 6.1% per patient. Checking blood glucose 3 times daily - 120-140 fasting and 150 after a meal Caring for mom and is worried about her Lost 24 lbs in the past 4 months.  Anthropometrics  62" 306 lbs 06/10/2022 331 lbs 02/04/2022 342 lbs 09/28/2021 351 lbs 06/21/2021 and 347 lbs at home  345 lbs 04/27/2021  Obese since 52 yo and gained more weight since her father died in 2003/07/16 389 lbs 3 weeks ago States that she lost by stopping eating due to frustration over her weight. Goal:  290 lbs  Clinical Medical Hx: Type 2 Diabetes Jul 15, 2000), OSA (just tested and not on C-pap yet), iron deficient anemia, CAD, cellulitis, CHF Medications: see list to include Farxiga, lasix, spironolactone, iron, vitamin B-12, Klor con, vitamin D, Ozempic Labs: 6.5% 11/24/2020 Notable Signs/Symptoms: 165 fasting glucose  Lifestyle & Dietary Hx Patient lives with her mother.  They share shopping and cooking.   She does not drive and takes an [place] or her mother takes her. She spoke to her brother who is a Tax adviser.  He is a vegetarian.  She is trying to wean off meat.She is on food stamps.  Fluid intake:  2 L daily Sleep: Now sleeping about 4 1/2 hours, 03/2021 visit:   "not sleeping at all"  4 am-6 am - "so tired, can't fall  asleep", Sometimes she is scared to sleep for fear of not waking up. Stress / self-care: high.   Current average weekly physical activity:  Takes them to the dog park (10 blocks away) twice a week and is doing armchair exercise videos 3 times per day.  24-Hr Dietary Recall Lactose intolerant Does not use added salt. Trying to eat 1 meal per day First Meal:  grits, eggs, [employer] toast, coffee with equal Snack: none Second  lunch Snack: fruit Third Meal: roast beef, mashed potatoes and greens Snack:  Beverages: water, diet cranberry juice, coffee with 1 sugar, occasional regular soda  NUTRITION DIAGNOSIS  NB-1.1 Food and nutrition-related knowledge deficit As related to balance of carbohydrates, protein, and fat for weight loss.  As evidenced by diet hx and patient report.   NUTRITION INTERVENTION  Nutrition education (E-1) on the following topics:  Encouraged her to continue to stay active and benefits on blood glucose Encouraged her to continue lifestyle changes  Reviewed mindfulness, choices and hunger and fullness cues Encouraged patient to increase her vegetable intake particularly dark green leafy vegetables Reviewed balanced meals  Handouts Provided Include: (initial visit) Anemia nutrition Therapy from AND Snack sheet My plate ADA book How to Thrive:  A guide for your journey with diabetes Diabetes resource page Heart healthy, consistent carbohydrate nutrition therapy from AND Meal plan card 1500 calorie sample meal plan from AND (5 day) [employer] Anemia nutrition therapy from [place] for [employer] Teaching method utilized: Visual & Auditory  Demonstrated degree of understanding via: Teach Back  Barriers to learning/adherence to lifestyle change: lifelong habits, motivation  Goals Continue to make changes that you can stick with.  Great job on changes made. Increase your Dark Green Leafy vegetables such as Broccoli, kale,  collards, spinach. Continue to use Mrs. Dash rather than salt. Balance your meals Eat breakfast, lunch, dinner daily. Listen to your body.  Eat slowly and stop when satisfied/full. Eat mostly plants Small amount of lean protein at each meal Continue to stay active Consider 2 Tablespoons of ground flax seeds daily  MONITORING & EVALUATION Dietary intake, weekly physical activity, and label reading in 2-4 months.  Next Steps  Patient is to call for questions.

## 2022-06-10 NOTE — Patient Instructions (Addendum)
Continue to make changes that you can stick with.  Great job on changes made. Increase your Dark Green Leafy vegetables such as Broccoli, kale, collards, spinach. Continue to use Mrs. Dash rather than salt. Balance your meals Eat breakfast, lunch, dinner daily. Listen to your body.  Eat slowly and stop when satisfied/full. Eat mostly plants Small amount of lean protein at each meal Continue to stay active Consider 2 Tablespoons of ground flax seeds daily

## 2022-06-10 NOTE — Telephone Encounter (Signed)
Patient presents to office inquiring about the referral sent back in Jan states she never heard from the office about getting a appointment, she would also like her Ozempic upgraded since she is doing so well with the 1.

## 2022-06-11 ENCOUNTER — Ambulatory Visit (HOSPITAL_COMMUNITY): Payer: Medicaid Other

## 2022-06-13 ENCOUNTER — Telehealth: Payer: Self-pay | Admitting: Emergency Medicine

## 2022-06-13 ENCOUNTER — Ambulatory Visit (HOSPITAL_COMMUNITY): Payer: Medicaid Other

## 2022-06-13 NOTE — Telephone Encounter (Signed)
Copied from [place] [id number]. Topic: Referral - Question >> Jun 12, 2022  4:50 PM Leone Payor F wrote: Reason for CRM: Patient is calling in because Dr.Kuwahara was supposed to send in referral for patient's camera test for her hemorrhoids. Patient also wants to know if her Ozempic dose can be upgraded. Please follow up with patient.

## 2022-06-16 DIAGNOSIS — G4733 Obstructive sleep apnea (adult) (pediatric): Secondary | ICD-10-CM | POA: Diagnosis not present

## 2022-06-16 IMAGING — CR DG CHEST 2V
2 series · 2 of 2 positions shown · non-contrast
Comparison: 01/21/2020

CLINICAL DATA: Port-A-Cath positioning confirmation.

EXAM:
CHEST - 2 VIEW

[w chest pa *]
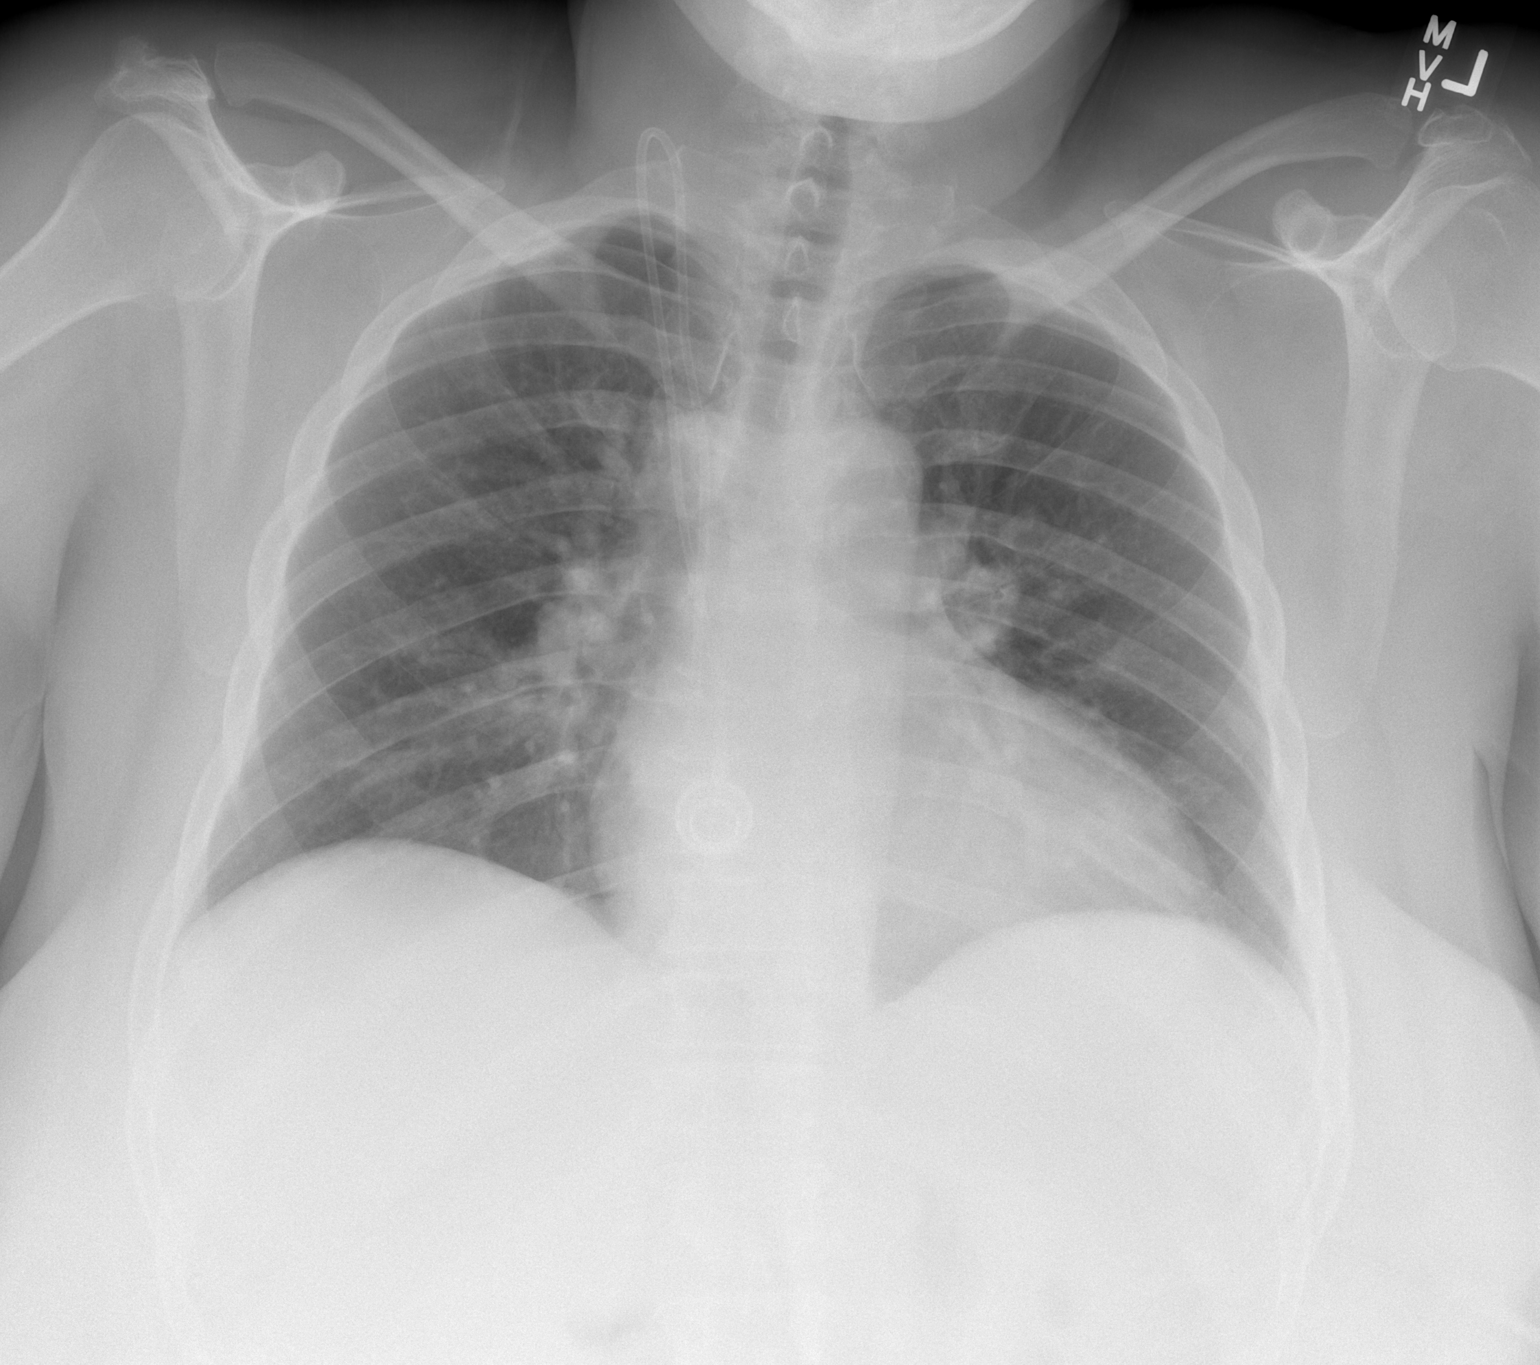

[w chest lat *]
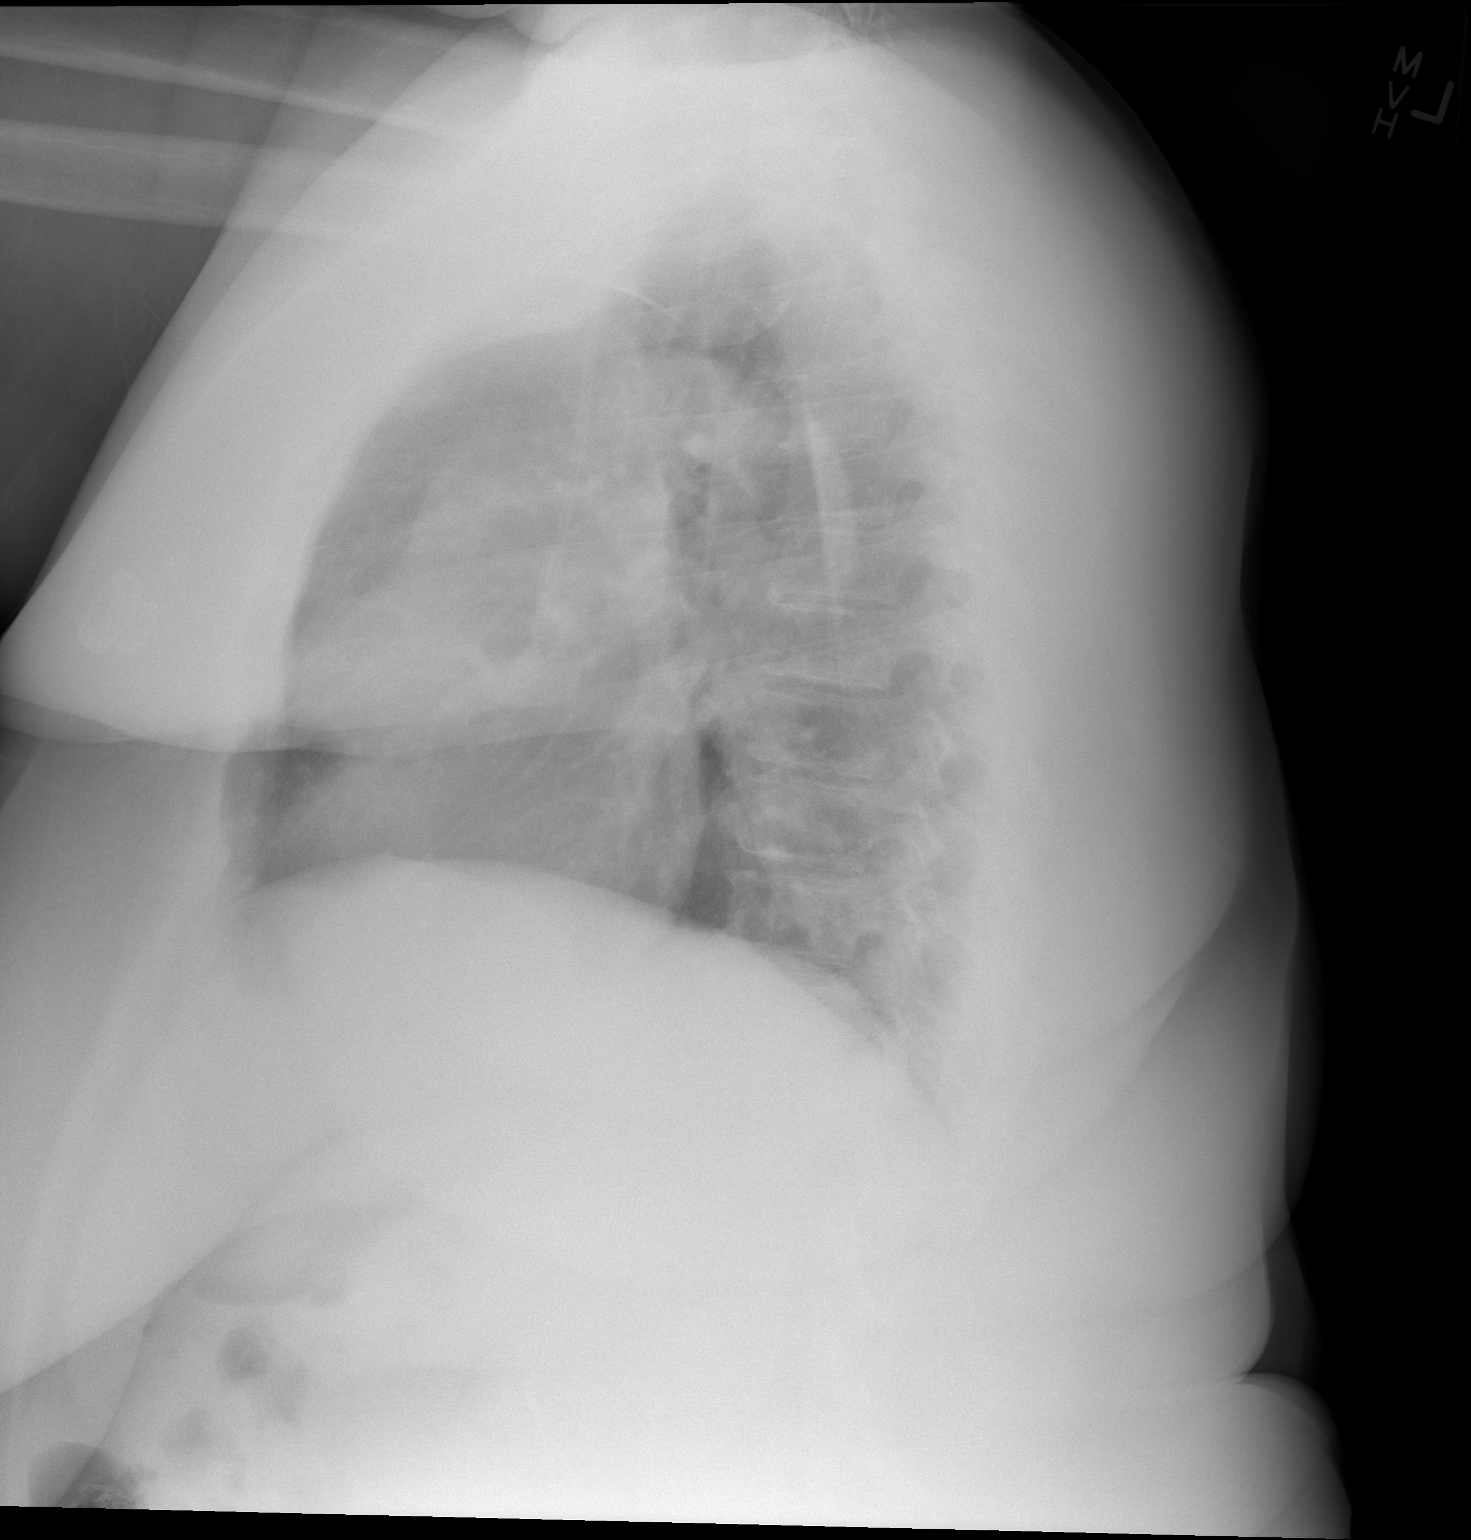

[2 of 2 positions shown; findings below may reference images not displayed]

FINDINGS: A right jugular Port-A-Cath remains in place terminating near the
superior cavoatrial junction. The cardiac silhouette remains mildly
enlarged. The lungs are mildly hypoinflated. No confluent airspace
opacity, edema, pleural effusion, or pneumothorax is identified. No
acute osseous abnormality is seen.
IMPRESSION: Port-A-Cath appears appropriately positioned. No evidence of active
cardiopulmonary disease.

## 2022-06-18 ENCOUNTER — Ambulatory Visit (HOSPITAL_BASED_OUTPATIENT_CLINIC_OR_DEPARTMENT_OTHER): Payer: Medicaid Other | Admitting: General Surgery

## 2022-06-18 ENCOUNTER — Ambulatory Visit (HOSPITAL_COMMUNITY): Payer: Medicaid Other

## 2022-06-18 ENCOUNTER — Telehealth: Payer: Self-pay | Admitting: Gastroenterology

## 2022-06-18 NOTE — Telephone Encounter (Signed)
Good morning Dr. Candis Schatz  The following patient was referred to [place] for blood in stool. She had a colonoscopy/EGD done in 2021. I confirmed that all reports were normal. Her records are on Epic for review. Can we please have a decision today being she has waited over 2 months to be seen. Please review and advise of scheduling. Thank you.

## 2022-06-19 NOTE — Telephone Encounter (Signed)
Called & spoke to the patient. Verified name & DOB. Informed that progress & increase in dosage of Ozempic will be discussed on the next visit. [name] stated that she has been contacted by GI and waiting to be scheduled.

## 2022-06-20 ENCOUNTER — Ambulatory Visit (HOSPITAL_COMMUNITY): Payer: Medicaid Other

## 2022-06-20 NOTE — Telephone Encounter (Signed)
Patient called to follow up on request

## 2022-06-21 DIAGNOSIS — E119 Type 2 diabetes mellitus without complications: Secondary | ICD-10-CM | POA: Diagnosis not present

## 2022-06-21 LAB — HM DIABETES EYE EXAM

## 2022-06-24 ENCOUNTER — Encounter: Payer: Self-pay | Admitting: Gastroenterology

## 2022-06-24 NOTE — Telephone Encounter (Signed)
LM on vmail to call back to schedule an OV with Dr. Candis Schatz

## 2022-06-25 ENCOUNTER — Ambulatory Visit (HOSPITAL_COMMUNITY): Payer: Medicaid Other

## 2022-06-27 ENCOUNTER — Ambulatory Visit (HOSPITAL_COMMUNITY): Payer: Medicaid Other

## 2022-07-01 ENCOUNTER — Other Ambulatory Visit: Payer: Self-pay | Admitting: Internal Medicine

## 2022-07-01 DIAGNOSIS — Z86718 Personal history of other venous thrombosis and embolism: Secondary | ICD-10-CM

## 2022-07-02 ENCOUNTER — Ambulatory Visit (HOSPITAL_COMMUNITY): Payer: Medicaid Other

## 2022-07-02 NOTE — Telephone Encounter (Signed)
Requested Prescriptions  Pending Prescriptions Disp Refills   ELIQUIS 5 MG TABS tablet [Pharmacy Med Name: ELIQUIS 5 MG TABLET] 180 tablet 1    Sig: TAKE 1 TABLET BY MOUTH TWICE A DAY     Hematology:  Anticoagulants - apixaban Failed - 07/01/2022  9:08 AM      Failed - PLT in normal range and within 360 days    Platelets  Date Value Ref Range Status  01/31/2022 109 (L) 150 - 450 x10E3/uL Final    Comment:    Actual platelet count may be somewhat higher than reported due to aggregation of platelets in this sample.    Platelet Count  Date Value Ref Range Status  03/29/2022 101 (L) 150 - 400 K/uL Final         Failed - HGB in normal range and within 360 days    Hemoglobin  Date Value Ref Range Status  03/29/2022 11.5 (L) 12.0 - 15.0 g/dL Final  01/31/2022 12.3 11.1 - 15.9 g/dL Final         Failed - AST in normal range and within 360 days    AST  Date Value Ref Range Status  03/29/2022 11 (L) 15 - 41 U/L Final         Passed - HCT in normal range and within 360 days    HCT  Date Value Ref Range Status  03/29/2022 37.5 36.0 - 46.0 % Final   Hematocrit  Date Value Ref Range Status  01/31/2022 38.8 34.0 - 46.6 % Final         Passed - Cr in normal range and within 360 days    Creatinine  Date Value Ref Range Status  03/29/2022 0.64 0.44 - 1.00 mg/dL Final         Passed - ALT in normal range and within 360 days    ALT  Date Value Ref Range Status  03/29/2022 10 0 - 44 U/L Final         Passed - Valid encounter within last 12 months    Recent Outpatient Visits           1 month ago Type 2 diabetes mellitus with morbid obesity ([place])   [place] [name] B, MD   4 months ago Type 2 diabetes mellitus with morbid obesity [facility])   [place], Jarome Matin, RPH-CPP   6 months ago [place], MD   7 months ago Type  2 diabetes mellitus with morbid obesity [facility])   [place] Ladell Pier, MD   8 months ago Medication refill   [place], MD       Future Appointments             In 2 months Ladell Pier, MD [place]

## 2022-07-04 ENCOUNTER — Ambulatory Visit (HOSPITAL_COMMUNITY): Payer: Medicaid Other

## 2022-07-09 ENCOUNTER — Ambulatory Visit (HOSPITAL_COMMUNITY): Payer: Medicaid Other

## 2022-07-10 ENCOUNTER — Telehealth (HOSPITAL_COMMUNITY): Payer: Self-pay

## 2022-07-10 ENCOUNTER — Other Ambulatory Visit: Payer: Self-pay | Admitting: Internal Medicine

## 2022-07-10 NOTE — Telephone Encounter (Signed)
Pt is not interested in the pulmonary rehab program. Closed referral. 

## 2022-07-11 ENCOUNTER — Ambulatory Visit (HOSPITAL_COMMUNITY): Payer: Medicaid Other

## 2022-07-15 DIAGNOSIS — G4733 Obstructive sleep apnea (adult) (pediatric): Secondary | ICD-10-CM | POA: Diagnosis not present

## 2022-07-16 ENCOUNTER — Ambulatory Visit (HOSPITAL_COMMUNITY): Payer: Medicaid Other

## 2022-07-18 ENCOUNTER — Ambulatory Visit (HOSPITAL_COMMUNITY): Payer: Medicaid Other

## 2022-08-14 ENCOUNTER — Encounter: Payer: Self-pay | Admitting: Gastroenterology

## 2022-08-14 ENCOUNTER — Ambulatory Visit (INDEPENDENT_AMBULATORY_CARE_PROVIDER_SITE_OTHER): Payer: Medicaid Other | Admitting: Gastroenterology

## 2022-08-14 ENCOUNTER — Other Ambulatory Visit (INDEPENDENT_AMBULATORY_CARE_PROVIDER_SITE_OTHER): Payer: Medicaid Other

## 2022-08-14 VITALS — BP 130/80 | HR 91 | Ht 63.0 in | Wt 286.0 lb

## 2022-08-14 DIAGNOSIS — K602 Anal fissure, unspecified: Secondary | ICD-10-CM

## 2022-08-14 DIAGNOSIS — D649 Anemia, unspecified: Secondary | ICD-10-CM | POA: Diagnosis not present

## 2022-08-14 DIAGNOSIS — D696 Thrombocytopenia, unspecified: Secondary | ICD-10-CM

## 2022-08-14 LAB — CBC WITH DIFFERENTIAL/PLATELET
Basophils Absolute: 0 10*3/uL (ref 0.0–0.1)
Basophils Relative: 0.3 % (ref 0.0–3.0)
Eosinophils Absolute: 0.1 10*3/uL (ref 0.0–0.7)
Eosinophils Relative: 1.6 % (ref 0.0–5.0)
HCT: 38.5 % (ref 36.0–46.0)
Hemoglobin: 12.4 g/dL (ref 12.0–15.0)
Lymphocytes Relative: 15.4 % (ref 12.0–46.0)
Lymphs Abs: 0.8 10*3/uL (ref 0.7–4.0)
MCHC: 32.1 g/dL (ref 30.0–36.0)
MCV: 84.1 fl (ref 78.0–100.0)
Monocytes Absolute: 0.5 10*3/uL (ref 0.1–1.0)
Monocytes Relative: 9.1 % (ref 3.0–12.0)
Neutro Abs: 3.9 10*3/uL (ref 1.4–7.7)
Neutrophils Relative %: 73.6 % (ref 43.0–77.0)
Platelets: 126 10*3/uL — ABNORMAL LOW (ref 150.0–400.0)
RBC: 4.58 Mil/uL (ref 3.87–5.11)
RDW: 14.6 % (ref 11.5–15.5)
WBC: 5.2 10*3/uL (ref 4.0–10.5)

## 2022-08-14 LAB — HEPATIC FUNCTION PANEL
ALT: 6 U/L (ref 0–35)
AST: 11 U/L (ref 0–37)
Albumin: 4.1 g/dL (ref 3.5–5.2)
Alkaline Phosphatase: 70 U/L (ref 39–117)
Bilirubin, Direct: 0.2 mg/dL (ref 0.0–0.3)
Total Bilirubin: 0.9 mg/dL (ref 0.2–1.2)
Total Protein: 8 g/dL (ref 6.0–8.3)

## 2022-08-14 MED ORDER — AMBULATORY NON FORMULARY MEDICATION: 0 refills | Status: DC

## 2022-08-14 NOTE — Patient Instructions (Signed)
_______________________________________________________  If your blood pressure at your visit was 140/90 or greater, please contact your primary care physician to follow up on this.  _______________________________________________________  If you are age 54 or older, your body mass index should be between 23-30. Your Body mass index is 50.66 kg/m. If this is out of the aforementioned range listed, please consider follow up with your Primary Care Provider.  If you are age 79 or younger, your body mass index should be between 19-25. Your Body mass index is 50.66 kg/m. If this is out of the aformentioned range listed, please consider follow up with your Primary Care Provider.  Your provider has requested that you go to the basement level for lab work before leaving today. Press "B" on the elevator. The lab is located at the first door on the left as you exit the elevator.  You have been scheduled for an abdominal ultrasound at [facility] (1st floor of hospital) on 08/23/22 at 9am. Please arrive 30 minutes prior to your appointment for registration. Make certain not to have anything to eat or drink 6 hours prior to your appointment. Should you need to reschedule your appointment, please contact radiology at [phone]. This test typically takes about 30 minutes to perform.    We have sent a prescription for nitroglycerin 0.125% gel to [facility]. You should apply a pea size amount to your rectum three times daily x 6-8 weeks.  [facility]'s information is below: Address: [name] [place], [place] [address]  [phone]  *Please DO NOT go directly from our office to pick up this medication! Give the pharmacy 1 day to process the prescription as this is compounded and takes time to make.   The [facility] providers would like to encourage you to use [facility] to communicate with providers for non-urgent requests or questions.  Due to long hold times on  the telephone, sending your provider a message by [facility] may be a faster and more efficient way to get a response.  Please allow 48 business hours for a response.  Please remember that this is for non-urgent requests.  It was a pleasure to see you today!  Thank you for trusting me with your gastrointestinal care!    Scott E.Tomasa Rand, MD

## 2022-08-14 NOTE — Progress Notes (Signed)
HPI : Almeda Ezra is a very pleasant 52 year old female with a history of COPD, OSA, morbid obesity, diabetes and hx of DVT on chronic anticoagulation who is referred to [place] by Dr. [name] for further evaluation of hematochezia and painful bowel movements.  She states that she started seeing blood in the stool about three months ago.  She has never had blood in the stool previously.  She went to the ED in December and was told she had hemorrhoids.  She has been taking stool softeners and using a hemorrhoid cream since then.  She reports pain with passage of stools, noted more with hard stools.  She does struggle with constipation chronically, manifested by straining with defecation and passage of small hard stools.  She has a bowel movement most days of the week.  She reports trying metamucil in the past, and doesn't think it was helpful.  She has also tried milk of magnesium.  She has occasional mild abdominal discomfort and gas/bloating but denies problems with significant abdominal pain.  No diarrhea.   She was admitted to the hospital in [facility] in 2021 with symtpomatic anemia (Hgb 5).  She underwent an EGD and colonoscopy which were apparently unremarkable (no report available, but discharge summary indicates normal endoscopies).  A VCE was recommended at that time, but was never completed.  She has a chronic mild anemia since then, but has never had a significant drop since 2021.  Her baseline hgb seems to be around 11-12 with MCV in the 80s-90s. She reports irregular heavy periods 3 days are heavy, then slow.  She had a subtotal oopherectomy in the 1990s, but has continued to menstruate erratically since then.   She has a chronic thrombocytopenia, with platelet counts in the 100-140 range.  Etiology does not appear to be known. No known history of liver disease.   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   Morbid obesity due to excess calories    OSA (obstructive sleep apnea) 07/11/2021   Sleep study April 06, 2019 (Dr. Craige Cotta): Moderate OSA (AHI of 24.4 and SPO2 low of 77%).  Majority events during REM sleep.-recommend CPAP, oral appliance or surgical. Reviewed sleep hygiene.  Avoid sedatives.     Osteoarthritis of left knee    Prediabetes    Small bowel obstruction    as a child   Speech impediment    Stutter / stammer     Past Surgical History:  Procedure Laterality Date   ABDOMINAL WALL DEFECT REPAIR  1970   IR CV LINE  INJECTION  10/24/2020   IR REMOVAL TUN ACCESS W/ PORT W/O FL MOD SED  05/28/2021   IVC FILTER INSERTION  2017   Lower Extremity Venous Duplex  06/23/2020   No evidence of DVT or superficial thrombosis bilaterally.  No evidence of deep venous insufficiency bilaterally.  No evidence of SSV reflux.  Right GSV in the calf has reflux, no reflux in L GSV.;  Repeated in July 2020-no DVT   OOPHORECTOMY  1996   OOPHORECTOMY  1997   PORTACATH PLACEMENT  2014   TRANSTHORACIC ECHOCARDIOGRAM  05/18/2021   EF 60 to 65%.  No RWMA.  Mild concentric LVH.   "Normal diastolic parameters ".  Normal longitudinal strain.  Normal PAP, RAP.  Normal aortic and mitral valves.==> In July 2022, echo read as GR 1 DD otherwise stable.   Family History  Problem Relation Age of Onset   Diabetes Mellitus II Mother    COPD Father    Diabetes Father    Colon cancer Father    Diabetes Mellitus II Maternal Grandmother    Breast cancer Paternal Grandfather    Liver disease Neg Hx    Esophageal cancer Neg Hx    Social History   Tobacco Use   Smoking status: Former    Packs/day: 0.25    Years: 18.00    Additional pack years: 0.00    Total pack years: 4.50    Types: Cigarettes    Quit date: 04/29/2002    Years since quitting: 20.3   Smokeless tobacco: Never  Vaping Use   Vaping Use: Never used  Substance Use Topics   Alcohol use: Not Currently   Drug use: Not Currently   Current Outpatient Medications  Medication Sig Dispense Refill   Accu-Chek Softclix Lancets lancets Use to check blood sugar once daily. E11.69 100 each 2   acetaminophen (TYLENOL) 500 MG tablet Take 1 tablet (500 mg total) by mouth every 6 (six) hours as needed. (Patient taking differently: Take 500 mg by mouth every 6 (six) hours as needed for mild pain.) 60 tablet 0   acetaminophen (TYLENOL) 650 MG CR tablet Take 650 mg by mouth every 8 (eight) hours as needed for pain.     albuterol (PROAIR HFA) 108 (90 Base) MCG/ACT inhaler INHALE TWO PUFFS BY MOUTH EVERY 6 HOURS AS NEEDED FOR WHEEZING OR SHORTNESS OF BREATH 8.5 g 0   amLODipine (NORVASC) 5 MG tablet TAKE 1/2 TABLET BY MOUTH DAILY 45 tablet 1   atorvastatin (LIPITOR) 40 MG tablet Take 1 tablet (40 mg total) by mouth daily. 90 tablet 1   Blood Glucose Calibration (ACCU-CHEK GUIDE CONTROL) LIQD 1 each by In Vitro route daily. 1 each 4   Blood Glucose Monitoring Suppl (ACCU-CHEK GUIDE) w/Device KIT Use to check blood sugar once daily. E11.69 1 kit 0   Blood Glucose Monitoring Suppl (ACCU-CHEK GUIDE) w/Device KIT 1 each by Does not  apply route 2 (two) times daily. 1 kit 0   busPIRone (BUSPAR) 7.5 MG tablet Take 1 tablet by mouth 2 (two) times daily.     Carboxymethylcellulose Sodium (EYE DROPS OP) Place 1 drop into both eyes daily as needed (dry eyes).     carvedilol (COREG) 25 MG tablet TAKE 1 TABLET BY MOUTH TWICE A DAY WITH A MEAL 180 tablet 1   cholecalciferol (VITAMIN D3) 25 MCG (1000 UNIT) tablet Take 1 tablet (1,000 Units total) by mouth daily. 30 tablet 5   diclofenac Sodium (VOLTAREN) 1 % GEL Apply 2 g  topically 4 (four) times daily. 100 g 1   DULERA 200-5 MCG/ACT AERO INHALE 2 PUFFS EVERY MORNING AND 2 PUFFS EVERY NIGHT AT BEDTIME 13 g 3   ELIQUIS 5 MG TABS tablet TAKE 1 TABLET BY MOUTH TWICE A DAY 180 tablet 1   FARXIGA 10 MG TABS tablet TAKE 1 TABLET BY MOUTH DAILY 90 tablet 0   FEROSUL 325 (65 Fe) MG tablet TAKE ONE TABLET BY MOUTH DAILY WITH BREAKFAST 100 tablet 0   furosemide (LASIX) 80 MG tablet Take 1 tablet (80 mg total) by mouth daily. Take additional dose of lasix for weight gain, leg swelling 30 tablet 4   gabapentin (NEURONTIN) 100 MG capsule Take 1 capsule (100 mg total) by mouth 3 (three) times daily. 90 capsule 3   glucose blood (ACCU-CHEK GUIDE) test strip Use to check blood sugar once daily. E11.69 100 each 2   hydrocortisone cream 1 % Apply 1 application topically daily as needed for itching.     hydrOXYzine (VISTARIL) 25 MG capsule Take 1 capsule (25 mg total) by mouth at bedtime as needed. (Patient taking differently: Take 25 mg by mouth at bedtime as needed for anxiety or itching.) 30 capsule 1   methocarbamol (ROBAXIN) 500 MG tablet Take 1 tablet (500 mg total) by mouth 2 (two) times daily as needed for muscle spasms. 60 tablet 1   methylPREDNISolone (MEDROL DOSEPAK) 4 MG TBPK tablet Medrol Dosepak take as instructed 21 tablet 0   neomycin-bacitracin-polymyxin 3.5-7657788486 OINT Apply 1 Application topically 2 (two) times daily. 15 g 0   nitroGLYCERIN (NITROSTAT) 0.4 MG SL tablet Place 1 tablet  (0.4 mg total) under the tongue every 5 (five) minutes as needed for chest pain. 30 tablet 0   PARoxetine (PAXIL) 20 MG tablet TAKE 1 TABLET BY MOUTH DAILY 90 tablet 1   polyethylene glycol powder (GLYCOLAX/MIRALAX) 17 GM/SCOOP powder Take 17 g by mouth daily as needed for mild constipation. 3350 g 1   potassium chloride SA (KLOR-CON M20) 20 MEQ tablet Take 1 tablet (20 mEq total) by mouth daily. 30 tablet 4   Semaglutide, 1 MG/DOSE, (OZEMPIC, 1 MG/DOSE,) 4 MG/3ML SOPN DIAL AND INJECT 1 MG UNDER THE SKIN ONCE WEEKLY 3 mL 1   spironolactone (ALDACTONE) 25 MG tablet TAKE 1 TABLET BY MOUTH DAILY 90 tablet 1   vitamin B-12 (CYANOCOBALAMIN) 1000 MCG tablet Take 1,000 mcg by mouth daily.     No current facility-administered medications for this visit.   Allergies  Allergen Reactions   Ace Inhibitors Rash and Other (See Comments)    Make pt bleed   Aspirin Other (See Comments)    Per patient paperwork: blood clot?  Likely because of chronic DOAC   Hydromorphone Hives and Itching   Vancomycin Itching and Rash   Contrast Media [Iodinated Contrast Media] Hives   Dilaudid [Hydromorphone Hcl] Hives   Lidocaine Itching    Itching with Lidocaine patch reported 06/13/2021 vis telephone message.     Review of Systems: All systems reviewed and negative except where noted in HPI.    No results found.  Physical Exam: BP 130/80   Pulse 91   Ht 5\' 3"  (1.6 m)   Wt 286 lb (129.7 kg)   BMI 50.66 kg/m  Constitutional: Pleasant,well-developed, morbidly obese African American female in no acute distress. HEENT: Normocephalic and atraumatic. Conjunctivae are normal. No scleral icterus.  Esotropia Neck supple.  Cardiovascular: Normal rate, regular rhythm.  Pulmonary/chest: Effort normal and breath sounds normal. No wheezing, rales  or rhonchi. Abdominal: Soft, nondistended, nontender. Bowel sounds active throughout. There are no masses palpable. No hepatomegaly. Extremities: no edema Rectal: Christ Kick, CMA present; No skin tag, no external hemorrhoid.  DRE with anterior pain on palpation and with insertion, c/w fissure.  Normal sphincter tone, no mass lesion. Neurological: Alert and oriented to person place and time.  Stutter present Skin: Skin is warm and dry. No rashes noted. Psychiatric: Normal mood and affect. Behavior is normal.  CBC    Component Value Date/Time   WBC 5.9 03/29/2022 1301   WBC 3.4 (L) 03/07/2022 0329   RBC 4.13 03/29/2022 1301   HGB 11.5 (L) 03/29/2022 1301   HGB 12.3 01/31/2022 1341   HCT 37.5 03/29/2022 1301   HCT 38.8 01/31/2022 1341   PLT 101 (L) 03/29/2022 1301   PLT 109 (L) 01/31/2022 1341   MCV 90.8 03/29/2022 1301   MCV 87 01/31/2022 1341   MCH 27.8 03/29/2022 1301   MCHC 30.7 03/29/2022 1301   RDW 14.6 03/29/2022 1301   RDW 12.3 01/31/2022 1341   LYMPHSABS 0.7 03/29/2022 1301   LYMPHSABS 0.5 (L) 11/29/2020 1445   MONOABS 0.4 03/29/2022 1301   EOSABS 0.1 03/29/2022 1301   EOSABS 0.0 11/29/2020 1445   BASOSABS 0.0 03/29/2022 1301   BASOSABS 0.0 11/29/2020 1445    CMP     Component Value Date/Time   NA 141 03/29/2022 1301   NA 144 01/03/2022 1531   K 3.7 03/29/2022 1301   CL 105 03/29/2022 1301   CO2 29 03/29/2022 1301   GLUCOSE 131 (H) 03/29/2022 1301   BUN 7 03/29/2022 1301   BUN 5 (L) 01/03/2022 1531   CREATININE 0.64 03/29/2022 1301   CALCIUM 9.7 03/29/2022 1301   PROT 7.7 03/29/2022 1301   PROT 7.0 11/29/2020 1445   ALBUMIN 4.1 03/29/2022 1301   ALBUMIN 4.0 11/29/2020 1445   AST 11 (L) 03/29/2022 1301   ALT 10 03/29/2022 1301   ALKPHOS 66 03/29/2022 1301   BILITOT 1.0 03/29/2022 1301   GFRNONAA >60 03/29/2022 1301   GFRAA >60 01/20/2020 1935     ASSESSMENT AND PLAN:  52 year old female with history of morbid obesity, COPD, OSA and DM, referred for painful hematochezia.  Her rectal exam was negative for a visible fissure, but she had significant pain with palpation of the anterior anal canal, most consistent with a  fissure.  We discussed the pathophysiology of anal fissures, and the principles of anal fissure management, to include avoiding hard stools, straining/optimization of bowel habits and stool consistency.  Recommend fiber supplementation with psyllium to help with this.  Will treat with intra-anal nitroglycerin 0.125% twice daily for 6 weeks.  Recommended nightly Sitz baths to promote healing.  If symptoms not significantly improved or resolved in 6-8 weeks, follow up for consideration of colonoscopy to rule out Crohn's disease and referral to General Surgery to consider further treatment options such as Botox injection or sphincterotomy. For her thrombocytopenia, will get [place] and hepatic panel to evaluate for evidence of cirrhosis/portal hypertension.  If none present, she may benefit from a hematology evaluation.  With regards to her admission for acute anemia in 2021, her hgb has been stable since then.  Although a VCE was recommended at the time, given the stability of her hemoglobin over the past 3 years, I don't think a VCE is necessary at this time. Will recheck a CBC and iron panel.  If hgb has dropped or if significant iron deficiency, will  reconsider VCE.  Anal fissure - Topical nitroglycerin 0.125% BID for 6 weeks - Daily Metamucil - Night sitz baths - follow up 8 weeks if symptoms persistent  Thrombocytopenia - [place]  Anemia - Repeat CBC, hepatic panel, iron  Charlissa Petros E. Tomasa Rand, MD [facility]   Marcine Matar, MD

## 2022-08-15 DIAGNOSIS — G4733 Obstructive sleep apnea (adult) (pediatric): Secondary | ICD-10-CM | POA: Diagnosis not present

## 2022-08-15 LAB — IBC + FERRITIN
Ferritin: 165.9 ng/mL (ref 10.0–291.0)
Iron: 49 ug/dL (ref 42–145)
Saturation Ratios: 17 % — ABNORMAL LOW (ref 20.0–50.0)
TIBC: 288.4 ug/dL (ref 250.0–450.0)
Transferrin: 206 mg/dL — ABNORMAL LOW (ref 212.0–360.0)

## 2022-08-16 ENCOUNTER — Telehealth: Payer: Self-pay | Admitting: Gastroenterology

## 2022-08-16 NOTE — Telephone Encounter (Signed)
Left VM for patient letting her know that Nitroglycerin has to be sent to a compounding pharmacy and can not be sent to her regular pharmacy.

## 2022-08-16 NOTE — Telephone Encounter (Signed)
Patient calling states her insurance does not cover that medication, requesting another one. Please advise

## 2022-08-16 NOTE — Telephone Encounter (Signed)
Patient is calling states her Nitroglycerin gel is not at her pharmacy. Please advise

## 2022-08-16 NOTE — Telephone Encounter (Signed)
Inbound call from patient, wishing to speak to [facility] in regards to medication.

## 2022-08-16 NOTE — Telephone Encounter (Signed)
Called patient and let her know that I would ask Dr.Cunningham if there were any alternatives.

## 2022-08-19 NOTE — Progress Notes (Signed)
Ms. Butsch,  Your labs looked good.  Your hemoglobin was within normal range and your iron studies were not entirely consistent with iron deficiency.  Liver enzymes were normal.

## 2022-08-21 NOTE — Telephone Encounter (Signed)
Left VM for patient via her mothers phone ( Patient's phone is out of service) letting patient know that we looked at alternatives and they were the same price.

## 2022-08-23 ENCOUNTER — Ambulatory Visit (HOSPITAL_COMMUNITY)
Admission: RE | Admit: 2022-08-23 | Discharge: 2022-08-23 | Disposition: A | Discharge status: Home / Self Care | Payer: Medicaid Other | Source: Ambulatory Visit | Attending: Gastroenterology | Admitting: Gastroenterology

## 2022-08-23 ENCOUNTER — Other Ambulatory Visit: Payer: Self-pay | Admitting: Internal Medicine

## 2022-08-23 DIAGNOSIS — D649 Anemia, unspecified: Secondary | ICD-10-CM | POA: Diagnosis present

## 2022-08-23 DIAGNOSIS — K602 Anal fissure, unspecified: Secondary | ICD-10-CM | POA: Diagnosis present

## 2022-08-23 DIAGNOSIS — D696 Thrombocytopenia, unspecified: Secondary | ICD-10-CM

## 2022-08-23 DIAGNOSIS — E1169 Type 2 diabetes mellitus with other specified complication: Secondary | ICD-10-CM

## 2022-08-23 MED ORDER — OZEMPIC (1 MG/DOSE) 4 MG/3ML ~~LOC~~ SOPN: PEN_INJECTOR | SUBCUTANEOUS | 0 refills | Status: DC

## 2022-08-23 NOTE — Telephone Encounter (Signed)
Medication Refill - Medication:  Semaglutide, 1 MG/DOSE, (OZEMPIC, 1 MG/DOSE,) 4 MG/3ML SOPN   Has the patient contacted their pharmacy? No. (Agent: If no, request that the patient contact the pharmacy for the refill. If patient does not wish to contact the pharmacy document the reason why and proceed with request.) (Agent: If yes, when and what did the pharmacy advise?)  Preferred Pharmacy (with phone number or street name):  [name] [id number] - [place], [place] - [address] Phone: [phone]  Fax: [phone]     Has the patient been seen for an appointment in the last year OR does the patient have an upcoming appointment? Yes.    Agent: Please be advised that RX refills may take up to 3 business days. We ask that you follow-up with your pharmacy.

## 2022-08-23 NOTE — Telephone Encounter (Signed)
Requested Prescriptions  Pending Prescriptions Disp Refills   Semaglutide, 1 MG/DOSE, (OZEMPIC, 1 MG/DOSE,) 4 MG/3ML SOPN 3 mL 0    Sig: DIAL AND INJECT 1 MG UNDER THE SKIN ONCE WEEKLY     Endocrinology:  Diabetes - GLP-1 Receptor Agonists - semaglutide Passed - 08/23/2022 11:53 AM      Passed - HBA1C in normal range and within 180 days    HbA1c, POC (prediabetic range)  Date Value Ref Range Status  04/07/2020 5.7 5.7 - 6.4 % Final   HbA1c, POC (controlled diabetic range)  Date Value Ref Range Status  05/07/2022 5.9 0.0 - 7.0 % Final         Passed - Cr in normal range and within 360 days    Creatinine  Date Value Ref Range Status  03/29/2022 0.64 0.44 - 1.00 mg/dL Final         Passed - Valid encounter within last 6 months    Recent Outpatient Visits           3 months ago Type 2 diabetes mellitus with morbid obesity (HCC)   [place] [facility] [name] B, MD   6 months ago Type 2 diabetes mellitus with morbid obesity [facility])   [place] [facility] [place], Cornelius Moras, RPH-CPP   7 months ago Dysuria   [facility] [name] B, MD   9 months ago Type 2 diabetes mellitus with morbid obesity [facility])   [place] [facility] Marcine Matar, MD   10 months ago Medication refill   [facility] Marcine Matar, MD       Future Appointments             In 1 week Marcine Matar, MD [facility]

## 2022-08-26 NOTE — Progress Notes (Signed)
[name], Your ultrasound showed findings suggestive of fatty liver disease.  This is a common finding and more often in patients who are obese or who have diabetes.  The liver did not appear cirrhotic.  Treatment for fatty liver centers around improving diet and exercise habits with weight loss.  We can discuss this diagnosis further at follow-up.  It is unlikely that your low platelets are due to your liver.  I would recommend you discuss your low platelets further with your hematologist (Dr. Candise Che) at your follow-up visit with him in June.

## 2022-08-27 DIAGNOSIS — H5213 Myopia, bilateral: Secondary | ICD-10-CM | POA: Diagnosis not present

## 2022-08-28 NOTE — Telephone Encounter (Signed)
Left VM for patient to return.

## 2022-08-28 NOTE — Telephone Encounter (Signed)
Patient returned your call.

## 2022-08-28 NOTE — Telephone Encounter (Signed)
Patient called stating that stool softeners and suppositories are making her constipated. Patient is also wanting know if there are any other alternatives [place] is charging 190 for Diltiazem 2 % , nifedipine 0.5 % , and nitroglycerin. Pleas advise.

## 2022-08-29 NOTE — Telephone Encounter (Signed)
Patient returned call she stated that she was taking metamucil and miralax daily. I let her know that she can get recticare over the counter . Patient stated she would come by to pick up samples tomorrow.

## 2022-09-03 ENCOUNTER — Other Ambulatory Visit: Payer: Self-pay | Admitting: Internal Medicine

## 2022-09-03 NOTE — Telephone Encounter (Signed)
Requested medication (s) are due for refill today: yes  Requested medication (s) are on the active medication list: yes  Last refill:  01/07/22 #90 3 RF  Future visit scheduled: yes  Notes to clinic:  med not addressed at last visit- has not had med since Jan 2024   Requested Prescriptions  Pending Prescriptions Disp Refills   gabapentin (NEURONTIN) 100 MG capsule [Pharmacy Med Name: GABAPENTIN 100 MG CAPSULE] 270 capsule     Sig: TAKE ONE CAPSULE BY MOUTH THREE TIMES A DAY     Neurology: Anticonvulsants - gabapentin Passed - 09/03/2022  8:54 AM      Passed - Cr in normal range and within 360 days    Creatinine  Date Value Ref Range Status  03/29/2022 0.64 0.44 - 1.00 mg/dL Final         Passed - Completed PHQ-2 or PHQ-9 in the last 360 days      Passed - Valid encounter within last 12 months    Recent Outpatient Visits           3 months ago Type 2 diabetes mellitus with morbid obesity (HCC)   [place] [facility] [name] B, MD   7 months ago Type 2 diabetes mellitus with morbid obesity [facility])   [place] [facility] [place], Cornelius Moras, RPH-CPP   8 months ago Dysuria   [facility] [name] B, MD   9 months ago Type 2 diabetes mellitus with morbid obesity [facility])   [place] [facility] Marcine Matar, MD   10 months ago Medication refill   [facility] Marcine Matar, MD       Future Appointments             In 2 days Marcine Matar, MD [facility]

## 2022-09-05 ENCOUNTER — Ambulatory Visit: Payer: Medicaid Other | Attending: Internal Medicine | Admitting: Internal Medicine

## 2022-09-05 ENCOUNTER — Encounter: Payer: Self-pay | Admitting: Internal Medicine

## 2022-09-05 DIAGNOSIS — I5032 Chronic diastolic (congestive) heart failure: Secondary | ICD-10-CM

## 2022-09-05 DIAGNOSIS — E1159 Type 2 diabetes mellitus with other circulatory complications: Secondary | ICD-10-CM

## 2022-09-05 DIAGNOSIS — Z1231 Encounter for screening mammogram for malignant neoplasm of breast: Secondary | ICD-10-CM

## 2022-09-05 DIAGNOSIS — L8992 Pressure ulcer of unspecified site, stage 2: Secondary | ICD-10-CM | POA: Insufficient documentation

## 2022-09-05 DIAGNOSIS — J4540 Moderate persistent asthma, uncomplicated: Secondary | ICD-10-CM | POA: Diagnosis not present

## 2022-09-05 DIAGNOSIS — Z6829 Body mass index (BMI) 29.0-29.9, adult: Secondary | ICD-10-CM

## 2022-09-05 DIAGNOSIS — E785 Hyperlipidemia, unspecified: Secondary | ICD-10-CM

## 2022-09-05 DIAGNOSIS — E1169 Type 2 diabetes mellitus with other specified complication: Secondary | ICD-10-CM | POA: Diagnosis not present

## 2022-09-05 DIAGNOSIS — Z7984 Long term (current) use of oral hypoglycemic drugs: Secondary | ICD-10-CM | POA: Diagnosis not present

## 2022-09-05 DIAGNOSIS — I152 Hypertension secondary to endocrine disorders: Secondary | ICD-10-CM | POA: Diagnosis not present

## 2022-09-05 DIAGNOSIS — K760 Fatty (change of) liver, not elsewhere classified: Secondary | ICD-10-CM

## 2022-09-05 DIAGNOSIS — D696 Thrombocytopenia, unspecified: Secondary | ICD-10-CM | POA: Diagnosis not present

## 2022-09-05 LAB — POCT GLYCOSYLATED HEMOGLOBIN (HGB A1C): HbA1c, POC (controlled diabetic range): 5.5 % (ref 0.0–7.0)

## 2022-09-05 LAB — GLUCOSE, POCT (MANUAL RESULT ENTRY): POC Glucose: 90 mg/dl (ref 70–99)

## 2022-09-05 MED ORDER — FUROSEMIDE 80 MG PO TABS: 80.0000 mg | ORAL_TABLET | Freq: Every day | ORAL | 1 refills | Status: DC

## 2022-09-05 MED ORDER — OZEMPIC (1 MG/DOSE) 4 MG/3ML ~~LOC~~ SOPN
PEN_INJECTOR | SUBCUTANEOUS | 6 refills | Status: DC
Start: 2022-09-05 — End: 2022-11-19

## 2022-09-05 MED ORDER — AMLODIPINE BESYLATE 5 MG PO TABS: 2.5000 mg | ORAL_TABLET | Freq: Every day | ORAL | 1 refills | Status: DC

## 2022-09-05 NOTE — Progress Notes (Signed)
Patient ID: [name], female    DOB: [birth year]  MRN: [id number]  CC: Diabetes (DM f/u. Franchot Erichsen ozempic)   Subjective: [name] is a 52 y.o. 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  EGD and colonoscopy negative, chronic ulcer left lower extremity,    Asthma:  doing well on Dulera.  No recent flares  DM/Obesity: Results for orders placed or performed in visit on 09/05/22  POCT glucose (manual entry)  Result Value Ref Range   POC Glucose 90 70 - 99 mg/dl  POCT glycosylated hemoglobin (Hb A1C)  Result Value Ref Range   Hemoglobin A1C     HbA1c POC (<> result, manual entry)     HbA1c, POC (prediabetic range)     HbA1c, POC (controlled diabetic range) 5.5 0.0 - 7.0 %  On Ozempic 1 mg Q wk and Farxiga 10 mg daily No S.E from Ozempic.  Down 22 lbs since I saw her 04/2022 and 52 pounds since she started Ozempic in July of last year.  Endorses dec appetite.  Walks QOD and uses stepper at the gym   CAD/CHF/HTN:  Doing well on meds that include Norvasc 2.5 mg daily, Coreg 25 mg twice a day, furosemide 80 mg and spironolactone 25 mg.  Also on Lipitor.  Taking and tolerating all meds No LE edema, CP/SOB.  No PND Limits salt in foods  Hx of DVT: Tolerating Eliquis.  No bruising or bleeding.  Last hemoglobin was 12.4 up from 11.5. Has mild intermittent low PLT count.  Saw GI Dr. Tomasa Rand for rectal fissure and low PLT.  US showed fatty liver.  He did not feel the low PLT due to liver ds.  Platelet count has ranged from 101-136,000.  Most recent reading was 126,000.  Patient Active Problem List   Diagnosis Date Noted   Moderate major depression (HCC)  05/07/2022   Influenza A 03/06/2022   Hyperlipidemia with target LDL less than 100 02/17/2022   Pain in thoracic spine 09/25/2021   Low back pain 09/25/2021   OSA (obstructive sleep apnea) 07/11/2021   Depression 05/28/2021   Encounter for care related to Port-a-Cath 05/27/2021   Morbid obesity with BMI of 50.0-59.9, adult (HCC) 05/26/2021   Fever 11/22/2020   Bilateral lower extremity edema 11/22/2020   History of DVT (deep vein thrombosis) 11/22/2020   Asthma, chronic, moderate persistent, with acute exacerbation 11/22/2020   Lymphedema 06/09/2020   Iron deficiency anemia due to chronic blood loss 05/10/2020   Venous insufficiency 04/13/2020   S/P insertion of IVC (inferior vena caval) filter 04/13/2020   Chronic anemia 04/13/2020   Essential hypertension 04/13/2020   (HFpEF) heart failure with preserved ejection fraction (HCC) -> although echo suggests normal diastolic parameters with normal left atrial size 04/13/2020   CAD (coronary artery disease) 11/17/2019     Current Outpatient Medications on File Prior to Visit  Medication Sig Dispense Refill   Accu-Chek Softclix Lancets lancets Use to check blood sugar once daily. E11.69 100 each 2   acetaminophen (TYLENOL) 500 MG tablet Take 1 tablet (500 mg total) by mouth every 6 (six) hours as needed. (Patient taking differently: Take 500 mg by mouth every 6 (six) hours as  needed for mild pain.) 60 tablet 0   acetaminophen (TYLENOL) 650 MG CR tablet Take 650 mg by mouth every 8 (eight) hours as needed for pain.     albuterol (PROAIR HFA) 108 (90 Base) MCG/ACT inhaler INHALE TWO PUFFS BY MOUTH EVERY 6 HOURS AS NEEDED FOR WHEEZING OR SHORTNESS OF BREATH 8.5 g 0   AMBULATORY NON FORMULARY MEDICATION nitroglycerin 0.125% gel apply a pea size amount to your rectum three times daily x 6-8 weeks. 30 g 0   atorvastatin (LIPITOR) 40 MG tablet Take 1 tablet (40 mg total) by mouth daily. 90 tablet 1   Blood Glucose Calibration (ACCU-CHEK GUIDE  CONTROL) LIQD 1 each by In Vitro route daily. 1 each 4   Blood Glucose Monitoring Suppl (ACCU-CHEK GUIDE) w/Device KIT Use to check blood sugar once daily. E11.69 1 kit 0   Blood Glucose Monitoring Suppl (ACCU-CHEK GUIDE) w/Device KIT 1 each by Does not apply route 2 (two) times daily. 1 kit 0   busPIRone (BUSPAR) 7.5 MG tablet Take 1 tablet by mouth 2 (two) times daily.     Carboxymethylcellulose Sodium (EYE DROPS OP) Place 1 drop into both eyes daily as needed (dry eyes).     carvedilol (COREG) 25 MG tablet TAKE 1 TABLET BY MOUTH TWICE A DAY WITH A MEAL 180 tablet 1   cholecalciferol (VITAMIN D3) 25 MCG (1000 UNIT) tablet Take 1 tablet (1,000 Units total) by mouth daily. 30 tablet 5   diclofenac Sodium (VOLTAREN) 1 % GEL Apply 2 g topically 4 (four) times daily. 100 g 1   DULERA 200-5 MCG/ACT AERO INHALE 2 PUFFS EVERY MORNING AND 2 PUFFS EVERY NIGHT AT BEDTIME 13 g 3   ELIQUIS 5 MG TABS tablet TAKE 1 TABLET BY MOUTH TWICE A DAY 180 tablet 1   FARXIGA 10 MG TABS tablet TAKE 1 TABLET BY MOUTH DAILY 90 tablet 0   FEROSUL 325 (65 Fe) MG tablet TAKE ONE TABLET BY MOUTH DAILY WITH BREAKFAST 100 tablet 0   gabapentin (NEURONTIN) 100 MG capsule TAKE ONE CAPSULE BY MOUTH THREE TIMES A DAY 270 capsule 1   glucose blood (ACCU-CHEK GUIDE) test strip Use to check blood sugar once daily. E11.69 100 each 2   hydrocortisone cream 1 % Apply 1 application topically daily as needed for itching.     hydrOXYzine (VISTARIL) 25 MG capsule Take 1 capsule (25 mg total) by mouth at bedtime as needed. (Patient taking differently: Take 25 mg by mouth at bedtime as needed for anxiety or itching.) 30 capsule 1   methocarbamol (ROBAXIN) 500 MG tablet Take 1 tablet (500 mg total) by mouth 2 (two) times daily as needed for muscle spasms. 60 tablet 1   methylPREDNISolone (MEDROL DOSEPAK) 4 MG TBPK tablet Medrol Dosepak take as instructed 21 tablet 0   neomycin-bacitracin-polymyxin [phone] OINT Apply 1 Application topically 2  (two) times daily. 15 g 0   nitroGLYCERIN (NITROSTAT) 0.4 MG SL tablet Place 1 tablet (0.4 mg total) under the tongue every 5 (five) minutes as needed for chest pain. 30 tablet 0   PARoxetine (PAXIL) 20 MG tablet TAKE 1 TABLET BY MOUTH DAILY 90 tablet 1   polyethylene glycol powder (GLYCOLAX/MIRALAX) 17 GM/SCOOP powder Take 17 g by mouth daily as needed for mild constipation. 3350 g 1   potassium chloride SA (KLOR-CON M20) 20 MEQ tablet Take 1 tablet (20 mEq total) by mouth daily. 30 tablet 4   spironolactone (ALDACTONE) 25 MG tablet TAKE 1 TABLET BY MOUTH DAILY  90 tablet 1   vitamin B-12 (CYANOCOBALAMIN) 1000 MCG tablet Take 1,000 mcg by mouth daily.     No current facility-administered medications on file prior to visit.    Allergies  Allergen Reactions   Ace Inhibitors Rash and Other (See Comments)    Make pt bleed   Aspirin Other (See Comments)    Per patient paperwork: blood clot?  Likely because of chronic DOAC   Hydromorphone Hives and Itching   Vancomycin Itching and Rash   Contrast Media [Iodinated Contrast Media] Hives   Dilaudid [Hydromorphone Hcl] Hives   Lidocaine Itching    Itching with Lidocaine patch reported 06/13/2021 vis telephone message.    Social History   Socioeconomic History   Marital status: Single    Spouse name: Not on file   Number of children: 0   Years of education: Not on file   Highest education level: Some college, no degree  Occupational History   Occupation: unemployed on disablity  Tobacco Use   Smoking status: Former    Packs/day: 0.25    Years: 18.00    Additional pack years: 0.00    Total pack years: 4.50    Types: Cigarettes    Quit date: 04/29/2002    Years since quitting: 20.3   Smokeless tobacco: Never  Vaping Use   Vaping Use: Never used  Substance and Sexual Activity   Alcohol use: Not Currently   Drug use: Not Currently   Sexual activity: Not Currently  Other Topics Concern   Not on file  Social History Narrative   Not  on file   Social Determinants of Health   Financial Resource Strain: Low Risk  (09/02/2022)   Overall Financial Resource Strain (CARDIA)    Difficulty of Paying Living Expenses: Not hard at all  Food Insecurity: Patient Declined (09/02/2022)   Hunger Vital Sign    Worried About Running Out of Food in the Last Year: Patient declined    Ran Out of Food in the Last Year: Patient declined  Transportation Needs: No Transportation Needs (09/02/2022)   PRAPARE - Administrator, Civil Service (Medical): No    Lack of Transportation (Non-Medical): No  Physical Activity: Insufficiently Active (09/02/2022)   Exercise Vital Sign    Days of Exercise per Week: 3 days    Minutes of Exercise per Session: 20 min  Stress: No Stress Concern Present (09/02/2022)   [employer] of Occupational Health - Occupational Stress Questionnaire    Feeling of Stress : Only a little  Social Connections: Moderately Isolated (09/02/2022)   Social Connection and Isolation Panel [NHANES]    Frequency of Communication with Friends and Family: More than three times a week    Frequency of Social Gatherings with Friends and Family: Twice a week    Attends Religious Services: More than 4 times per year    Active Member of [employer] or Organizations: No    Attends Banker Meetings: Never    Marital Status: Never married  Intimate Partner Violence: Not At Risk (03/10/2022)   Humiliation, Afraid, Rape, and Kick questionnaire    Fear of Current or Ex-Partner: No    Emotionally Abused: No    Physically Abused: No    Sexually Abused: No    Family History  Problem Relation Age of Onset   Diabetes Mellitus II Mother    COPD Father    Diabetes Father    Colon cancer Father    Diabetes Mellitus II Maternal Grandmother  Breast cancer Paternal Grandfather    Liver disease Neg Hx    Esophageal cancer Neg Hx     Past Surgical History:  Procedure Laterality Date   ABDOMINAL WALL DEFECT REPAIR  1970   IR  CV LINE INJECTION  10/24/2020   IR REMOVAL TUN ACCESS W/ PORT W/O FL MOD SED  05/28/2021   IVC FILTER INSERTION  2017   Lower Extremity Venous Duplex  06/23/2020   No evidence of DVT or superficial thrombosis bilaterally.  No evidence of deep venous insufficiency bilaterally.  No evidence of SSV reflux.  Right GSV in the calf has reflux, no reflux in L GSV.;  Repeated in July 2020-no DVT   OOPHORECTOMY  1996   OOPHORECTOMY  1997   PORTACATH PLACEMENT  2014   TRANSTHORACIC ECHOCARDIOGRAM  05/18/2021   EF 60 to 65%.  No RWMA.  Mild concentric LVH.  "Normal diastolic parameters ".  Normal longitudinal strain.  Normal PAP, RAP.  Normal aortic and mitral valves.==> In July 2022, echo read as GR 1 DD otherwise stable.    ROS: Review of Systems Negative except as stated above  PHYSICAL EXAM: BP 116/80 (BP Location: Left Arm, Patient Position: Sitting, Cuff Size: Large)   Pulse 79   Ht 5\' 3"  (1.6 m)   Wt 292 lb (132.5 kg)   SpO2 100%   BMI 51.73 kg/m   Wt Readings from Last 3 Encounters:  09/05/22 292 lb (132.5 kg)  08/14/22 286 lb (129.7 kg)  06/10/22 (!) 306 lb (138.8 kg)    Physical Exam  General appearance - alert, well appearing, morbidly obese middle-age African-American female and in no distress Mental status - normal mood, behavior, speech, dress, motor activity, and thought processes Neck - supple, no significant adenopathy Chest - clear to auscultation, no wheezes, rales or rhonchi, symmetric air entry Heart - normal rate, regular rhythm, normal S1, S2, no murmurs, rubs, clicks or gallops Extremities - peripheral pulses normal, no pedal edema, no clubbing or cyanosis      Latest Ref Rng & Units 08/14/2022    4:15 PM 03/29/2022    1:01 PM 03/07/2022    3:29 AM  CMP  Glucose 70 - 99 mg/dL  161  096   BUN 6 - 20 mg/dL  7  14   Creatinine 0.45 - 1.00 mg/dL  4.09  8.11   Sodium 914 - 145 mmol/L  141  139   Potassium 3.5 - 5.1 mmol/L  3.7  4.4   Chloride 98 - 111 mmol/L   105  102   CO2 22 - 32 mmol/L  29  30   Calcium 8.9 - 10.3 mg/dL  9.7  9.1   Total Protein 6.0 - 8.3 g/dL 8.0  7.7    Total Bilirubin 0.2 - 1.2 mg/dL 0.9  1.0    Alkaline Phos 39 - 117 U/L 70  66    AST 0 - 37 U/L 11  11    ALT 0 - 35 U/L 6  10     Lipid Panel     Component Value Date/Time   CHOL 174 11/22/2021 1506   TRIG 99 11/22/2021 1506   HDL 52 11/22/2021 1506   CHOLHDL 3.3 11/22/2021 1506   LDLCALC 104 (H) 11/22/2021 1506    CBC    Component Value Date/Time   WBC 5.2 08/14/2022 1615   RBC 4.58 08/14/2022 1615   HGB 12.4 08/14/2022 1615   HGB 11.5 (L) 03/29/2022 1301  HGB 12.3 01/31/2022 1341   HCT 38.5 08/14/2022 1615   HCT 38.8 01/31/2022 1341   PLT 126.0 (L) 08/14/2022 1615   PLT 101 (L) 03/29/2022 1301   PLT 109 (L) 01/31/2022 1341   MCV 84.1 08/14/2022 1615   MCV 87 01/31/2022 1341   MCH 27.8 03/29/2022 1301   MCHC 32.1 08/14/2022 1615   RDW 14.6 08/14/2022 1615   RDW 12.3 01/31/2022 1341   LYMPHSABS 0.8 08/14/2022 1615   LYMPHSABS 0.5 (L) 11/29/2020 1445   MONOABS 0.5 08/14/2022 1615   EOSABS 0.1 08/14/2022 1615   EOSABS 0.0 11/29/2020 1445   BASOSABS 0.0 08/14/2022 1615   BASOSABS 0.0 11/29/2020 1445    ASSESSMENT AND PLAN: 1. Type 2 diabetes mellitus with morbid obesity (HCC) Controlled.  Commended her on weight loss.  She is down 52 pounds since starting Ozempic around July of last year.  She is wanting the dose of Ozempic increased but I told her that as long as she continues to lose weight on the current dose I think we should keep her at the 1 mg.  She is agreeable to this.  Encouraged her to continue regular exercise and healthy eating. - POCT glucose (manual entry) - POCT glycosylated hemoglobin (Hb A1C) - Semaglutide, 1 MG/DOSE, (OZEMPIC, 1 MG/DOSE,) 4 MG/3ML SOPN; DIAL AND INJECT 1 MG UNDER THE SKIN ONCE WEEKLY  Dispense: 3 mL; Refill: 6  2. Hypertension associated with diabetes (HCC) At goal.  Continue medications listed above. -  amLODipine (NORVASC) 5 MG tablet; Take 0.5 tablets (2.5 mg total) by mouth daily.  Dispense: 45 tablet; Refill: 1  3. Hyperlipidemia associated with type 2 diabetes mellitus (HCC) Continue atorvastatin 40 mg daily. - Lipid panel  4. Diastolic CHF, chronic (HCC) Stable and compensated.  Continue Farxiga and furosemide. - furosemide (LASIX) 80 MG tablet; Take 1 tablet (80 mg total) by mouth daily.  Dispense: 90 tablet; Refill: 1  5. Fatty liver disease, nonalcoholic Discussed diagnosis.  Hopefully this will improve with continued weight loss.  6. Asthma, moderate persistent, well-controlled Controlled.  Continue Dulera  7. Thrombocytopenia (HCC) Platelet count has stayed above 100,000.  I recommend that she mentions the low PLT count to Dr. Candise Che when she sees him in July.  8. Encounter for screening mammogram for malignant neoplasm of breast - MM Digital Screening; Future    Patient was given the opportunity to ask questions.  Patient verbalized understanding of the plan and was able to repeat key elements of the plan.   This documentation was completed using Paediatric nurse.  Any transcriptional errors are unintentional.  Orders Placed This Encounter  Procedures   MM Digital Screening   Lipid panel   POCT glucose (manual entry)   POCT glycosylated hemoglobin (Hb A1C)     Requested Prescriptions   Signed Prescriptions Disp Refills   amLODipine (NORVASC) 5 MG tablet 45 tablet 1    Sig: Take 0.5 tablets (2.5 mg total) by mouth daily.   furosemide (LASIX) 80 MG tablet 90 tablet 1    Sig: Take 1 tablet (80 mg total) by mouth daily.   Semaglutide, 1 MG/DOSE, (OZEMPIC, 1 MG/DOSE,) 4 MG/3ML SOPN 3 mL 6    Sig: DIAL AND INJECT 1 MG UNDER THE SKIN ONCE WEEKLY    Return in about 4 months (around 01/06/2023).  [name], MD, FACP

## 2022-09-06 LAB — LIPID PANEL
Chol/HDL Ratio: 2.6 ratio (ref 0.0–4.4)
Cholesterol, Total: 129 mg/dL (ref 100–199)
HDL: 49 mg/dL (ref >39)
LDL Chol Calc (NIH): 66 mg/dL (ref 0–99)
Triglycerides: 70 mg/dL (ref 0–149)
VLDL Cholesterol Cal: 14 mg/dL (ref 5–40)

## 2022-09-09 ENCOUNTER — Encounter: Payer: Self-pay | Admitting: Dietician

## 2022-09-09 ENCOUNTER — Encounter: Payer: Commercial Managed Care - HMO | Attending: Internal Medicine | Admitting: Dietician

## 2022-09-09 DIAGNOSIS — E118 Type 2 diabetes mellitus with unspecified complications: Secondary | ICD-10-CM | POA: Diagnosis present

## 2022-09-09 NOTE — Patient Instructions (Signed)
Continue to make changes that you can stick with.  Great job on changes made. Increase your Dark Green Leafy vegetables such as Broccoli, kale, collards, spinach. Continue to use Mrs. Dash rather than salt. Balance your meals Eat breakfast, lunch, dinner daily. Listen to your body.  Eat slowly and stop when satisfied/full. Eat mostly plants Small amount of lean protein at each meal Continue to stay active Consider 2 Tablespoons of ground flax seeds daily 

## 2022-09-09 NOTE — Progress Notes (Signed)
Medical Nutrition Therapy  Appointment Start time:  1420  Appointment End time:  1445 Patient is here today alone.  She was last seen by this RD on 06/10/22.  She continues to lose weight and lost 22 lbs in the past 3 months. She and her brother are working to get her mother into a retirement facility. She states that she is trying to stop eating bread and chips. Stopped soda except on rare occasional.  Focus is on drinking more water. Hot tea with small amount of honey now rather than coffee. She is walking the dogs 30 minutes twice daily.  Goes to the complex gym and does the ecliptically or bike 15 minutes Fasting blood glucose 185 this am Sleep improved  and now sleeping 9 hours per night.  This improved since decreasing caffeine intake. States that she wants to know what to eat when when hungry as she states that what she does eat doesn't always make her full.  Also, that she eats in the middle of the night daily and misses lunch.  Primary concerns today:  She would like to lose weight.  She states that she did not qualify for weight loss surgery due to high risk. [name] Referral diagnosis: Type 2 Diabetes with obesity Preferred learning style: no preference indicated Learning readiness: ready  NUTRITION ASSESSMENT   Anthropometrics  62" 284 lbs 09/09/2022 292 lbs 09/05/2022 306 lbs 06/10/2022 331 lbs 02/04/2022 342 lbs 09/28/2021 351 lbs 06/21/2021 and 347 lbs at home  345 lbs 04/27/2021  Obese since 52 yo and gained more weight since her father died in 2003/09/21 389 lbs 3 weeks ago States that she lost by stopping eating due to frustration over her weight. Goal:  290 lbs  Clinical Medical Hx: Type 2 Diabetes 2000/09/20), OSA (just tested and not on C-pap yet), iron deficient anemia, CAD, cellulitis, CHF Medications: see list to include Farxiga, lasix, spironolactone, iron, vitamin B-12, Klor con, vitamin D, Ozempic Labs: A1C 5.5% 09/05/2022 decreased from 6.5% 11/24/2020 Notable Signs/Symptoms:  165 fasting glucose  Lifestyle & Dietary Hx Patient lives with her mother.  They share shopping and cooking.   She does not drive and takes an [place] or her mother takes her. She spoke to her brother who is a Librarian, academic.  He is a vegetarian.  She is trying to wean off meat.  She is on food stamps.  Fluid intake:  2 L daily Sleep: Now sleeping about 4 1/2 hours, 03/2021 visit:   "not sleeping at all"  4 am-6 am - "so tired, can't fall asleep", Sometimes she is scared to sleep for fear of not waking up. Stress / self-care: high.   Current average weekly physical activity:  Takes them to the dog park (10 blocks away) twice a week and is doing armchair exercise videos 3 times per day.  24-Hr Dietary Recall Lactose intolerant Does not use added salt. Trying to eat 1 meal per day First Meal:  boiled egg, [employer] toast with jelly, oatmeal with wheat germ, hot tea Snack: none Second  none Snack: popcorn (air popped) Third Meal 6 pm: baked potato, vegetarian lasagne, 1 cookie Snack: banana, apple Beverages: water, diet cranberry juice, coffee with 1 sugar, occasional regular soda  NUTRITION DIAGNOSIS  NB-1.1 Food and nutrition-related knowledge deficit As related to balance of carbohydrates, protein, and fat for weight loss.  As evidenced by diet hx and patient report.   NUTRITION INTERVENTION  Nutrition education (E-1) on the following topics:  Encouraged her to  continue to stay active and benefits on blood glucose Discussed a balanced plate and unprocessed carbohydrates to add to help her feel full  Handouts Provided Include: (initial visit) Anemia nutrition Therapy from AND Snack sheet My plate ADA book How to Thrive:  A guide for your journey with diabetes Diabetes resource page Heart healthy, consistent carbohydrate nutrition therapy from AND Meal plan card 1500 calorie sample meal plan from AND (5 day) ACLM [employer] Anemia nutrition therapy from AND  Learning Style &  Readiness for Change Teaching method utilized: Visual & Auditory  Demonstrated degree of understanding via: Teach Back  Barriers to learning/adherence to lifestyle change: lifelong habits, motivation  Goals Continue to make changes that you can stick with.  Great job on changes made. Increase your Dark Green Leafy vegetables such as Broccoli, kale, collards, spinach. Continue to use Mrs. Dash rather than salt. Balance your meals Eat breakfast, lunch, dinner daily. Listen to your body.  Eat slowly and stop when satisfied/full. Eat mostly plants Small amount of lean protein at each meal Continue to stay active Consider 2 Tablespoons of ground flax seeds daily  MONITORING & EVALUATION Dietary intake, weekly physical activity, and label reading in 2-4 months.  Next Steps  Patient is to call for questions.

## 2022-09-18 ENCOUNTER — Encounter: Payer: Self-pay | Admitting: Internal Medicine

## 2022-09-18 DIAGNOSIS — E1169 Type 2 diabetes mellitus with other specified complication: Secondary | ICD-10-CM | POA: Insufficient documentation

## 2022-09-30 ENCOUNTER — Encounter: Payer: Self-pay | Admitting: Hematology

## 2022-10-01 DIAGNOSIS — H524 Presbyopia: Secondary | ICD-10-CM | POA: Diagnosis not present

## 2022-10-04 ENCOUNTER — Other Ambulatory Visit: Payer: Self-pay

## 2022-10-04 DIAGNOSIS — D500 Iron deficiency anemia secondary to blood loss (chronic): Secondary | ICD-10-CM

## 2022-10-07 ENCOUNTER — Inpatient Hospital Stay: Payer: Commercial Managed Care - HMO | Attending: Hematology

## 2022-10-07 ENCOUNTER — Other Ambulatory Visit: Payer: Self-pay

## 2022-10-07 ENCOUNTER — Inpatient Hospital Stay (HOSPITAL_BASED_OUTPATIENT_CLINIC_OR_DEPARTMENT_OTHER): Payer: Commercial Managed Care - HMO | Admitting: Hematology

## 2022-10-07 VITALS — BP 108/81 | HR 87 | Temp 97.9°F | Resp 16 | Wt 278.1 lb

## 2022-10-07 DIAGNOSIS — Z87891 Personal history of nicotine dependence: Secondary | ICD-10-CM | POA: Diagnosis not present

## 2022-10-07 DIAGNOSIS — D500 Iron deficiency anemia secondary to blood loss (chronic): Secondary | ICD-10-CM

## 2022-10-07 DIAGNOSIS — E538 Deficiency of other specified B group vitamins: Secondary | ICD-10-CM | POA: Diagnosis not present

## 2022-10-07 LAB — FERRITIN: Ferritin: 255 ng/mL (ref 11–307)

## 2022-10-07 LAB — IRON AND IRON BINDING CAPACITY (CC-WL,HP ONLY)
Iron: 57 ug/dL (ref 28–170)
Saturation Ratios: 21 % (ref 10.4–31.8)
TIBC: 279 ug/dL (ref 250–450)
UIBC: 222 ug/dL (ref 148–442)

## 2022-10-07 LAB — CMP (CANCER CENTER ONLY)
ALT: 6 U/L (ref 0–44)
AST: 11 U/L — ABNORMAL LOW (ref 15–41)
Albumin: 4.1 g/dL (ref 3.5–5.0)
Alkaline Phosphatase: 71 U/L (ref 38–126)
Anion gap: 7 (ref 5–15)
BUN: 12 mg/dL (ref 6–20)
CO2: 30 mmol/L (ref 22–32)
Calcium: 10.1 mg/dL (ref 8.9–10.3)
Chloride: 103 mmol/L (ref 98–111)
Creatinine: 0.96 mg/dL (ref 0.44–1.00)
GFR, Estimated: >60 mL/min (ref >60)
Glucose, Bld: 108 mg/dL — ABNORMAL HIGH (ref 70–99)
Potassium: 3.6 mmol/L (ref 3.5–5.1)
Sodium: 140 mmol/L (ref 135–145)
Total Bilirubin: 1.1 mg/dL (ref 0.3–1.2)
Total Protein: 8.3 g/dL — ABNORMAL HIGH (ref 6.5–8.1)

## 2022-10-07 LAB — CBC WITH DIFFERENTIAL (CANCER CENTER ONLY)
Abs Immature Granulocytes: 0.02 10*3/uL (ref 0.00–0.07)
Basophils Absolute: 0 10*3/uL (ref 0.0–0.1)
Basophils Relative: 0 %
Eosinophils Absolute: 0.1 10*3/uL (ref 0.0–0.5)
Eosinophils Relative: 2 %
HCT: 39 % (ref 36.0–46.0)
Hemoglobin: 11.7 g/dL — ABNORMAL LOW (ref 12.0–15.0)
Immature Granulocytes: 0 %
Lymphocytes Relative: 15 %
Lymphs Abs: 1 10*3/uL (ref 0.7–4.0)
MCH: 26.9 pg (ref 26.0–34.0)
MCHC: 30 g/dL (ref 30.0–36.0)
MCV: 89.7 fL (ref 80.0–100.0)
Monocytes Absolute: 0.5 10*3/uL (ref 0.1–1.0)
Monocytes Relative: 8 %
Neutro Abs: 5 10*3/uL (ref 1.7–7.7)
Neutrophils Relative %: 75 %
Platelet Count: 153 10*3/uL (ref 150–400)
RBC: 4.35 MIL/uL (ref 3.87–5.11)
RDW: 15.1 % (ref 11.5–15.5)
WBC Count: 6.6 10*3/uL (ref 4.0–10.5)
nRBC: 0 % (ref 0.0–0.2)

## 2022-10-07 NOTE — Progress Notes (Signed)
HEMATOLOGY/ONCOLOGY CLINIC NOTE  Date of Service: 10/07/22    Patient Care Team: Marcine Matar, MD as PCP - General (Internal Medicine) Marykay Lex, MD as PCP - Cardiology (Cardiology) Craft, Calvert Cantor, RN as Case Manager Gustavus Bryant, LCSW as [facility] Care Management (Licensed Clinical Social Worker) Shaune Leeks as Social Worker  CHIEF COMPLAINTS/PURPOSE OF FOLLOW UP:  Follow-up for continued management of iron deficiency anemia  HISTORY OF PRESENTING ILLNESS:  [name] is a wonderful 52 y.o. female who has been referred to [place] by Dr. Laural Benes for evaluation and management of anemia. The pt reports that she is doing well overall.   The pt reports that she has had anemia since birth but is unaware of any genetic condition causing her anemia. She received IV iron for about 4 years but stopped 2 years ago when she moved to [place]. Pt had a Port-a-Cath placed for repeat IV Iron Infusions due to issues accessing her veins.   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 She has been on anticoagulation for nearly 10 years and denies any repeat blood clots since starting Eliquis. Her PCP is currently managing her anticoagulation. 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 The ulcer tends to heal in the winter and reopen in the summer. It is currently healed with no open area.  Pt had a Colonoscopy and EGD at [facility] in [place] in August of 2021 that were benign. She was experiencing bloody stools frequently but hasn't had any after her 2021 GI studies. She has discontinued Aspirin due to concerns for excessive blood loss.   Pt has had a bilateral oophorectomies. She does not recall being diagnosed with PCOS. She denies gluten intolerance or any thyroid dysfunction. Pt had a fall in 2019 that is causing ongong back pain. She  was seen after the fall and was found to have injury at L1 & L2. She has been given Tylenol for pain management. Pt has a history of chronic acid suppression. She had stopped but recently restarted Prilosec. She was Vitamin B12 deficient for about one year. Pt had a heart attack in but denies any history of liver disease. Pt has had her COVID19 vaccines and booster.  Most recent lab results  of CBC w/diff is as follows: all values are WNL except for RBC at 3.80, Hgb at 10.1, HCT at 35.3, MCHC at 28.6, RDW at 16.2, PLT at 148K.  On review of systems, pt reports fatigue and denies nose bleeds, gum bleeds, bloody stools, heavy menstrual losses, fevers, unexpected weight loss, chills, new back pain, abdominal pain and any other symptoms.   On PMHx the pt reports B/L Oophorectomy, Myocardial infarction, DVT, Iron deficiency anemia due to chronic blood loss.  INTERVAL HISTORY   [name] is a wonderful 52 y.o. female who is here for continued valuation and management of her anemia.    She was last seen by me on 03/29/2022. She noted expected weight loss and was doing well overall.   -Discussed lab results today, 10/07/2022, with patient.    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   Morbid obesity due to excess calories (HCC)    OSA (obstructive sleep apnea) 07/11/2021   Sleep study April 06, 2019 (Dr. Craige Cotta): Moderate OSA (AHI of 24.4 and SPO2 low of 77%).  Majority events during REM sleep.-recommend CPAP, oral appliance or surgical. Reviewed sleep hygiene.  Avoid sedatives.     Osteoarthritis of left knee    Prediabetes    Small bowel obstruction (HCC)    as a child   Speech impediment    Stutter / stammer    SURGICAL HISTORY: Past Surgical History:  Procedure Laterality Date   ABDOMINAL WALL DEFECT REPAIR  1970   IR CV LINE INJECTION  10/24/2020   IR REMOVAL TUN ACCESS W/ PORT W/O FL MOD SED  05/28/2021   IVC FILTER INSERTION  2017   Lower Extremity Venous Duplex  06/23/2020   No evidence of DVT or superficial thrombosis bilaterally.  No evidence of deep venous insufficiency bilaterally.  No evidence of SSV reflux.  Right GSV in the calf has reflux, no reflux in L GSV.;  Repeated in July 2020-no DVT   OOPHORECTOMY  1996   OOPHORECTOMY  1997   PORTACATH PLACEMENT  2014   TRANSTHORACIC ECHOCARDIOGRAM  05/18/2021   EF 60 to 65%.  No RWMA.  Mild concentric LVH.  "Normal diastolic parameters ".  Normal longitudinal strain.  Normal PAP, RAP.  Normal aortic and mitral valves.==> In July 2022, echo read as GR 1 DD otherwise stable.    SOCIAL HISTORY: Social History   Socioeconomic History   Marital status: Single    Spouse name: Not on file   Number of children: 0   Years of education: Not on file   Highest education level: 12th grade  Occupational History   Occupation: unemployed on disablity  Tobacco Use   Smoking status: Former    Packs/day: 0.25    Years: 18.00    Total pack years: 4.50    Types: Cigarettes    Quit date: 04/29/2002    Years since quitting: 19.9   Smokeless tobacco: Never   Vaping Use   Vaping Use: Never used  Substance and Sexual Activity   Alcohol use: Not Currently   Drug use: Not Currently   Sexual activity: Not Currently  Other Topics Concern   Not on file  Social History Narrative   Not on file   Social Determinants of Health   Financial Resource Strain: Low Risk  (09/06/2021)   Overall Financial Resource Strain (CARDIA)    Difficulty of Paying Living Expenses: Not very hard  Food Insecurity: No Food Insecurity (03/10/2022)   Hunger Vital Sign    Worried About Running Out of Food in the Last Year: Never true    Ran Out of Food in the Last Year: Never true  Transportation Needs: No Transportation Needs (03/10/2022)  PRAPARE - Administrator, Civil Service (Medical): No    Lack of Transportation (Non-Medical): No  Physical Activity: Sufficiently Active (12/14/2021)   Exercise Vital Sign    Days of Exercise per Week: 7 days    Minutes of Exercise per Session: 30 min  Stress: Stress Concern Present (02/27/2022)   [employer] of Occupational Health - Occupational Stress Questionnaire    Feeling of Stress : To some extent  Social Connections: Socially Isolated (09/06/2021)   Social Connection and Isolation Panel [NHANES]    Frequency of Communication with Friends and Family: More than three times a week    Frequency of Social Gatherings with Friends and Family: More than three times a week    Attends Religious Services: Never    Database administrator or Organizations: No    Attends Banker Meetings: Never    Marital Status: Never married  Intimate Partner Violence: Not At Risk (03/10/2022)   Humiliation, Afraid, Rape, and Kick questionnaire    Fear of Current or Ex-Partner: No    Emotionally Abused: No    Physically Abused: No    Sexually Abused: No    FAMILY HISTORY: Family History  Problem Relation Age of Onset   Diabetes Mellitus II Mother    COPD Father    Diabetes Father    Diabetes Mellitus II  Maternal Grandmother    Breast cancer Paternal Grandfather     ALLERGIES:  is allergic to ace inhibitors, aspirin, hydromorphone, vancomycin, contrast media [iodinated contrast media], dilaudid [hydromorphone hcl], and lidocaine.  MEDICATIONS:  Current Outpatient Medications  Medication Sig Dispense Refill   Accu-Chek Softclix Lancets lancets Use as instructed (Patient taking differently: 1 each by Other route See admin instructions. Use as instructed) 100 each 12   acetaminophen (TYLENOL) 500 MG tablet Take 1 tablet (500 mg total) by mouth every 6 (six) hours as needed. (Patient taking differently: Take 500 mg by mouth every 6 (six) hours as needed for mild pain.) 60 tablet 0   acetaminophen (TYLENOL) 650 MG CR tablet Take 650 mg by mouth every 8 (eight) hours as needed for pain.     albuterol (PROAIR HFA) 108 (90 Base) MCG/ACT inhaler INHALE TWO PUFFS BY MOUTH EVERY 6 HOURS AS NEEDED FOR WHEEZING OR SHORTNESS OF BREATH 8.5 g 0   amLODipine (NORVASC) 5 MG tablet TAKE 1/2 TABLET BY MOUTH DAILY 45 tablet 1   apixaban (ELIQUIS) 5 MG TABS tablet Take 1 tablet (5 mg total) by mouth 2 (two) times daily. 180 tablet 1   atorvastatin (LIPITOR) 40 MG tablet Take 1 tablet (40 mg total) by mouth daily. 90 tablet 1   Blood Glucose Calibration (ACCU-CHEK GUIDE CONTROL) LIQD 1 each by In Vitro route daily. 1 each 4   Blood Glucose Monitoring Suppl (ACCU-CHEK GUIDE) w/Device KIT 1 each by Does not apply route 2 (two) times daily. 1 kit 0   busPIRone (BUSPAR) 7.5 MG tablet Take 1 tablet by mouth 2 (two) times daily.     Carboxymethylcellulose Sodium (EYE DROPS OP) Place 1 drop into both eyes daily as needed (dry eyes).     carvedilol (COREG) 25 MG tablet Take 1 tablet (25 mg total) by mouth 2 (two) times daily with a meal. 60 tablet 3   cholecalciferol (VITAMIN D3) 25 MCG (1000 UNIT) tablet Take 1 tablet (1,000 Units total) by mouth daily. 30 tablet 5   FARXIGA 10 MG TABS tablet TAKE 1 TABLET BY MOUTH DAILY 90  tablet 0   FEROSUL 325 (65 Fe) MG tablet TAKE ONE TABLET BY MOUTH DAILY WITH BREAKFAST 100 tablet 0   furosemide (LASIX) 80 MG tablet Take 1 tablet (80 mg total) by mouth daily. Take additional dose of lasix for weight gain, leg swelling 30 tablet 4   gabapentin (NEURONTIN) 100 MG capsule Take 1 capsule (100 mg total) by mouth 3 (three) times daily. 90 capsule 3   glucose blood test strip Use as instructed (Patient taking differently: 1 each by Other route See admin instructions. Use as instructed) 100 each 12   hydrocortisone cream 1 % Apply 1 application topically daily as needed for itching.     hydrOXYzine (VISTARIL) 25 MG capsule Take 1 capsule (25 mg total) by mouth at bedtime as needed. (Patient taking differently: Take 25 mg by mouth at bedtime as needed for anxiety or itching.) 30 capsule 1   methocarbamol (ROBAXIN) 500 MG tablet Take 1 tablet (500 mg total) by mouth 2 (two) times daily as needed for muscle spasms. 60 tablet 1   methylPREDNISolone (MEDROL DOSEPAK) 4 MG TBPK tablet Medrol Dosepak take as instructed 21 tablet 0   mometasone-formoterol (DULERA) 200-5 MCG/ACT AERO INHALE 2 PUFFS BY MOUTH EVERY MORNING AND INHALE TWO PUFFS BY MOUTH EVERY NIGHT AT BEDTIME (Patient taking differently: Inhale 2 puffs into the lungs 2 (two) times daily.) 13 g 3   neomycin-bacitracin-polymyxin [phone] OINT Apply 1 Application topically 2 (two) times daily. 15 g 0   nitroGLYCERIN (NITROSTAT) 0.4 MG SL tablet Place 1 tablet (0.4 mg total) under the tongue every 5 (five) minutes as needed for chest pain. 30 tablet 0   PARoxetine (PAXIL) 20 MG tablet Take 1 tablet (20 mg total) by mouth daily. 30 tablet 3   potassium chloride SA (KLOR-CON M20) 20 MEQ tablet Take 1 tablet (20 mEq total) by mouth daily. 30 tablet 4   Semaglutide, 1 MG/DOSE, 4 MG/3ML SOPN Inject 1 mg as directed once a week. 3 mL 2   spironolactone (ALDACTONE) 25 MG tablet Take 1 tablet (25 mg total) by mouth daily. 30 tablet 3    vitamin B-12 (CYANOCOBALAMIN) 1000 MCG tablet Take 1,000 mcg by mouth daily.     No current facility-administered medications for this visit.    REVIEW OF SYSTEMS:   10 Point review of Systems was done is negative except as noted above.  PHYSICAL EXAMINATION: ECOG PERFORMANCE STATUS: 2 - Symptomatic, <50% confined to bed  . Vitals:   03/29/22 1417  BP: (!) 127/90  Pulse: 90  Resp: 20  Temp: 98.2 F (36.8 C)  SpO2: 100%    Filed Weights   03/29/22 1417  Weight: (!) 309 lb 4.8 oz (140.3 kg)   Body mass index is 56.57 kg/m.    GENERAL:alert, in no acute distress and comfortable SKIN: no acute rashes, no significant lesions EYES: conjunctiva are pink and non-injected, sclera anicteric OROPHARYNX: MMM, no exudates, no oropharyngeal erythema or ulceration NECK: supple, no JVD LYMPH:  no palpable lymphadenopathy in the cervical, axillary or inguinal regions LUNGS: clear to auscultation b/l with normal respiratory effort HEART: regular rate & rhythm ABDOMEN:  normoactive bowel sounds , non tender, not distended. Extremity: no pedal edema PSYCH: alert & oriented x 3 with fluent speech NEURO: no focal motor/sensory deficits   LABORATORY DATA:  I have reviewed the data as listed     Latest Ref Rng & Units 10/07/2022   11:14 AM 08/14/2022    4:15 PM 03/29/2022  1:01 PM  CBC  WBC 4.0 - 10.5 K/uL 6.6  5.2  5.9   Hemoglobin 12.0 - 15.0 g/dL 78.2  95.6  21.3   Hematocrit 36.0 - 46.0 % 39.0  38.5  37.5   Platelets 150 - 400 K/uL 153  126.0  101    . CBC    Component Value Date/Time   WBC 6.6 10/07/2022 1114   WBC 5.2 08/14/2022 1615   RBC 4.35 10/07/2022 1114   HGB 11.7 (L) 10/07/2022 1114   HGB 12.3 01/31/2022 1341   HCT 39.0 10/07/2022 1114   HCT 38.8 01/31/2022 1341   PLT 153 10/07/2022 1114   PLT 109 (L) 01/31/2022 1341   MCV 89.7 10/07/2022 1114   MCV 87 01/31/2022 1341   MCH 26.9 10/07/2022 1114   MCHC 30.0 10/07/2022 1114   RDW 15.1 10/07/2022 1114    RDW 12.3 01/31/2022 1341   LYMPHSABS 1.0 10/07/2022 1114   LYMPHSABS 0.5 (L) 11/29/2020 1445   MONOABS 0.5 10/07/2022 1114   EOSABS 0.1 10/07/2022 1114   EOSABS 0.0 11/29/2020 1445   BASOSABS 0.0 10/07/2022 1114   BASOSABS 0.0 11/29/2020 1445    .    Latest Ref Rng & Units 10/07/2022   11:14 AM 08/14/2022    4:15 PM 03/29/2022    1:01 PM  CMP  Glucose 70 - 99 mg/dL 086   578   BUN 6 - 20 mg/dL 12   7   Creatinine 4.69 - 1.00 mg/dL 6.29   5.28   Sodium 413 - 145 mmol/L 140   141   Potassium 3.5 - 5.1 mmol/L 3.6   3.7   Chloride 98 - 111 mmol/L 103   105   CO2 22 - 32 mmol/L 30   29   Calcium 8.9 - 10.3 mg/dL 24.4   9.7   Total Protein 6.5 - 8.1 g/dL 8.3  8.0  7.7   Total Bilirubin 0.3 - 1.2 mg/dL 1.1  0.9  1.0   Alkaline Phos 38 - 126 U/L 71  70  66   AST 15 - 41 U/L 11  11  11    ALT 0 - 44 U/L 6  6  10     . Lab Results  Component Value Date   IRON 57 10/07/2022   TIBC 279 10/07/2022   IRONPCTSAT 21 10/07/2022   (Iron and TIBC)  Lab Results  Component Value Date   FERRITIN 255 10/07/2022    RADIOGRAPHIC STUDIES: I have personally reviewed the radiological images as listed and agreed with the findings in the report. DG Chest Portable 1 View  Result Date: 03/05/2022 CLINICAL DATA:  Shortness of breath. EXAM: PORTABLE CHEST 1 VIEW COMPARISON:  February 07, 2022. FINDINGS: The heart size and mediastinal contours are within normal limits. Both lungs are clear. The visualized skeletal structures are unremarkable. IMPRESSION: No active disease. Electronically Signed   By: Lupita Raider M.D.   On: 03/05/2022 18:07     ASSESSMENT & PLAN:   52 y.o. with   1) Chronic iron deficiency anemia requiring recurrent IV Iron infusions. Likely from chronic GI losses and previously due to blood loss from venous stasis ulcer in the setting of chronic anticoagulation. 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 Patient Active Problem List   Diagnosis Date Noted   Influenza A 03/06/2022   COPD  with acute exacerbation (HCC) 03/05/2022   Hyperlipidemia with target LDL less than 100 02/17/2022   Pain in thoracic spine 09/25/2021   Low back pain 09/25/2021   OSA (obstructive sleep apnea) 07/11/2021   Depression 05/28/2021   Encounter for care related to Port-a-Cath 05/27/2021   Acute on chronic respiratory failure with hypoxia (HCC) 05/26/2021   Morbid obesity with BMI of 50.0-59.9, adult (HCC) 05/26/2021   Acute respiratory failure (HCC) 11/23/2020   Acute respiratory failure with hypoxia (HCC) 11/22/2020   Fever 11/22/2020   Bilateral lower extremity edema 11/22/2020   History of DVT (deep vein thrombosis) 11/22/2020   Asthma, chronic, moderate persistent, with acute exacerbation 11/22/2020   Lower limb ulcer, calf, left, limited to breakdown of skin (HCC) 06/09/2020   Lymphedema 06/09/2020   Iron deficiency anemia due to chronic blood loss 05/10/2020   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 (HCC) 04/13/2020   Venous insufficiency 04/13/2020   S/P insertion of IVC (inferior vena caval) filter 04/13/2020   Chronic anemia 04/13/2020   Essential hypertension 04/13/2020   (HFpEF) heart failure with preserved ejection fraction (HCC) -> although echo suggests normal diastolic parameters with normal left atrial size 04/13/2020   CAD (coronary artery disease) 11/17/2019   PLAN: -Patient notes no overt GI bleeding. -Her labs done today were reviewed with her in detail. -Today, her iron is 50, TIBC is 290 and iron saturation is 17. HGB improved from 10.8 to 11.5 since 03/07/22. -ferritin is 255 -CMP unremarkable - no indication for additional IV Iron replacement at this time. -Continue her vitamin B complex to support accelerated erythropoiesis -Return to clinic with Dr. Candise Che with labs in 6  months  FOLLOW UP: RTC with Dr Candise Che with labs in 6 months  The total time spent in the appointment was 20 minutes* .  All of the patient's questions were answered with apparent satisfaction. The patient knows to call the clinic with any problems, questions or concerns.   Wyvonnia Lora MD MS AAHIVMS [facility] Hematology/Oncology Physician [facility]  .*Total Encounter Time as defined by the Centers for Medicare and Medicaid Services includes, in addition to the face-to-face time of a patient visit (documented in the note above) non-face-to-face time: obtaining and reviewing outside history, ordering and reviewing medications, tests or procedures, care coordination (communications with other health care professionals or caregivers) and documentation in the medical record.   I,Param Shah,acting as a Neurosurgeon for Wyvonnia Lora, MD.,have documented all relevant documentation on the behalf of Wyvonnia Lora, MD,as directed by  Wyvonnia Lora, MD while in the presence of Wyvonnia Lora, MD.  .I have reviewed the above documentation for accuracy and completeness, and I agree with the above. Johney Maine MD

## 2022-10-14 ENCOUNTER — Encounter: Payer: Self-pay | Admitting: Hematology

## 2022-10-15 ENCOUNTER — Ambulatory Visit: Payer: Medicaid Other

## 2022-11-04 ENCOUNTER — Ambulatory Visit
Admission: RE | Admit: 2022-11-04 | Discharge: 2022-11-04 | Disposition: A | Discharge status: Home / Self Care | Payer: Medicaid Other | Source: Ambulatory Visit | Attending: Internal Medicine | Admitting: Internal Medicine

## 2022-11-04 ENCOUNTER — Ambulatory Visit: Payer: Medicaid Other

## 2022-11-04 DIAGNOSIS — Z1231 Encounter for screening mammogram for malignant neoplasm of breast: Secondary | ICD-10-CM

## 2022-11-14 ENCOUNTER — Other Ambulatory Visit: Payer: Self-pay | Admitting: Internal Medicine

## 2022-11-19 ENCOUNTER — Encounter: Payer: Self-pay | Admitting: Hematology

## 2022-11-19 ENCOUNTER — Other Ambulatory Visit: Payer: Self-pay

## 2022-11-19 ENCOUNTER — Telehealth: Payer: Self-pay

## 2022-11-19 ENCOUNTER — Other Ambulatory Visit: Payer: Self-pay | Admitting: Pharmacist

## 2022-11-19 MED ORDER — TRULICITY 1.5 MG/0.5ML ~~LOC~~ SOAJ: 1.5000 mg | SUBCUTANEOUS | 1 refills | Status: DC

## 2022-11-19 NOTE — Telephone Encounter (Signed)
Insurance prefers Science writer. If appropriate, please change and resend Trulicity for the Ozempic.

## 2022-11-19 NOTE — Telephone Encounter (Signed)
Yes ma'am, done. 

## 2022-11-20 ENCOUNTER — Telehealth: Payer: Self-pay | Admitting: Pharmacist

## 2022-11-20 DIAGNOSIS — E1169 Type 2 diabetes mellitus with other specified complication: Secondary | ICD-10-CM

## 2022-11-20 NOTE — Telephone Encounter (Signed)
Dr. Laural Benes,   Insurance will cover Trulicity but patient must have trial and failure of metformin before the PA will be approved. I did not see any documentation of trial/failure or any contraindication to metformin. If possible, do you think we can at least try her on metformin so her insurance will approve?

## 2022-11-21 ENCOUNTER — Other Ambulatory Visit: Payer: Self-pay

## 2022-11-21 NOTE — Addendum Note (Signed)
Addended by: [name] B on: 11/21/2022 09:26 PM   Modules accepted: Orders

## 2022-11-22 MED ORDER — SEMAGLUTIDE (1 MG/DOSE) 4 MG/3ML ~~LOC~~ SOPN
1.0000 mg | PEN_INJECTOR | SUBCUTANEOUS | 4 refills | Status: DC
Start: 2022-11-22 — End: 2023-02-14

## 2022-11-22 NOTE — Addendum Note (Signed)
Addended by: [name] B on: 11/22/2022 12:46 PM   Modules accepted: Orders

## 2022-11-22 NOTE — Telephone Encounter (Signed)
Called but no answer. LVM to call back.  

## 2022-11-25 ENCOUNTER — Telehealth: Payer: Self-pay

## 2022-11-25 ENCOUNTER — Emergency Department (HOSPITAL_BASED_OUTPATIENT_CLINIC_OR_DEPARTMENT_OTHER): Payer: Commercial Managed Care - HMO

## 2022-11-25 ENCOUNTER — Other Ambulatory Visit: Payer: Self-pay

## 2022-11-25 ENCOUNTER — Encounter: Payer: Self-pay | Admitting: Hematology

## 2022-11-25 ENCOUNTER — Encounter (HOSPITAL_BASED_OUTPATIENT_CLINIC_OR_DEPARTMENT_OTHER): Payer: Self-pay

## 2022-11-25 ENCOUNTER — Emergency Department (HOSPITAL_COMMUNITY): Payer: Commercial Managed Care - HMO

## 2022-11-25 ENCOUNTER — Ambulatory Visit: Payer: Self-pay

## 2022-11-25 ENCOUNTER — Inpatient Hospital Stay (HOSPITAL_BASED_OUTPATIENT_CLINIC_OR_DEPARTMENT_OTHER)
Admission: EM | Admit: 2022-11-25 | Discharge: 2022-11-30 | DRG: 149 | Disposition: A | Discharge status: Skilled Nursing Facility | Payer: Commercial Managed Care - HMO | Attending: Internal Medicine | Admitting: Internal Medicine

## 2022-11-25 DIAGNOSIS — I5032 Chronic diastolic (congestive) heart failure: Secondary | ICD-10-CM | POA: Diagnosis not present

## 2022-11-25 DIAGNOSIS — Z79899 Other long term (current) drug therapy: Secondary | ICD-10-CM

## 2022-11-25 DIAGNOSIS — J449 Chronic obstructive pulmonary disease, unspecified: Secondary | ICD-10-CM | POA: Diagnosis not present

## 2022-11-25 DIAGNOSIS — Z800 Family history of malignant neoplasm of digestive organs: Secondary | ICD-10-CM

## 2022-11-25 DIAGNOSIS — I2510 Atherosclerotic heart disease of native coronary artery without angina pectoris: Secondary | ICD-10-CM | POA: Diagnosis present

## 2022-11-25 DIAGNOSIS — Z86718 Personal history of other venous thrombosis and embolism: Secondary | ICD-10-CM | POA: Diagnosis not present

## 2022-11-25 DIAGNOSIS — G8929 Other chronic pain: Secondary | ICD-10-CM | POA: Diagnosis present

## 2022-11-25 DIAGNOSIS — Z95828 Presence of other vascular implants and grafts: Secondary | ICD-10-CM | POA: Diagnosis not present

## 2022-11-25 DIAGNOSIS — Z885 Allergy status to narcotic agent status: Secondary | ICD-10-CM | POA: Diagnosis not present

## 2022-11-25 DIAGNOSIS — Z91041 Radiographic dye allergy status: Secondary | ICD-10-CM | POA: Diagnosis not present

## 2022-11-25 DIAGNOSIS — Z6841 Body Mass Index (BMI) 40.0 and over, adult: Secondary | ICD-10-CM | POA: Diagnosis not present

## 2022-11-25 DIAGNOSIS — Z825 Family history of asthma and other chronic lower respiratory diseases: Secondary | ICD-10-CM

## 2022-11-25 DIAGNOSIS — Z1152 Encounter for screening for COVID-19: Secondary | ICD-10-CM

## 2022-11-25 DIAGNOSIS — Z7985 Long-term (current) use of injectable non-insulin antidiabetic drugs: Secondary | ICD-10-CM

## 2022-11-25 DIAGNOSIS — Z886 Allergy status to analgesic agent status: Secondary | ICD-10-CM | POA: Diagnosis not present

## 2022-11-25 DIAGNOSIS — E785 Hyperlipidemia, unspecified: Secondary | ICD-10-CM | POA: Diagnosis present

## 2022-11-25 DIAGNOSIS — E1169 Type 2 diabetes mellitus with other specified complication: Secondary | ICD-10-CM | POA: Diagnosis present

## 2022-11-25 DIAGNOSIS — Z833 Family history of diabetes mellitus: Secondary | ICD-10-CM

## 2022-11-25 DIAGNOSIS — D649 Anemia, unspecified: Secondary | ICD-10-CM | POA: Diagnosis present

## 2022-11-25 DIAGNOSIS — I110 Hypertensive heart disease with heart failure: Secondary | ICD-10-CM | POA: Diagnosis not present

## 2022-11-25 DIAGNOSIS — R42 Dizziness and giddiness: Secondary | ICD-10-CM | POA: Diagnosis not present

## 2022-11-25 DIAGNOSIS — Z87891 Personal history of nicotine dependence: Secondary | ICD-10-CM

## 2022-11-25 DIAGNOSIS — E876 Hypokalemia: Secondary | ICD-10-CM | POA: Diagnosis not present

## 2022-11-25 DIAGNOSIS — G4733 Obstructive sleep apnea (adult) (pediatric): Secondary | ICD-10-CM | POA: Diagnosis present

## 2022-11-25 DIAGNOSIS — Z803 Family history of malignant neoplasm of breast: Secondary | ICD-10-CM

## 2022-11-25 DIAGNOSIS — Z7901 Long term (current) use of anticoagulants: Secondary | ICD-10-CM

## 2022-11-25 DIAGNOSIS — D696 Thrombocytopenia, unspecified: Secondary | ICD-10-CM | POA: Diagnosis present

## 2022-11-25 DIAGNOSIS — I5030 Unspecified diastolic (congestive) heart failure: Secondary | ICD-10-CM | POA: Diagnosis present

## 2022-11-25 DIAGNOSIS — Z8673 Personal history of transient ischemic attack (TIA), and cerebral infarction without residual deficits: Secondary | ICD-10-CM

## 2022-11-25 DIAGNOSIS — E119 Type 2 diabetes mellitus without complications: Secondary | ICD-10-CM | POA: Diagnosis present

## 2022-11-25 DIAGNOSIS — R29818 Other symptoms and signs involving the nervous system: Secondary | ICD-10-CM | POA: Diagnosis not present

## 2022-11-25 DIAGNOSIS — Z888 Allergy status to other drugs, medicaments and biological substances status: Secondary | ICD-10-CM

## 2022-11-25 DIAGNOSIS — Z881 Allergy status to other antibiotic agents status: Secondary | ICD-10-CM | POA: Diagnosis not present

## 2022-11-25 DIAGNOSIS — I890 Lymphedema, not elsewhere classified: Secondary | ICD-10-CM | POA: Diagnosis present

## 2022-11-25 DIAGNOSIS — Z7951 Long term (current) use of inhaled steroids: Secondary | ICD-10-CM

## 2022-11-25 DIAGNOSIS — D509 Iron deficiency anemia, unspecified: Secondary | ICD-10-CM | POA: Diagnosis present

## 2022-11-25 DIAGNOSIS — I639 Cerebral infarction, unspecified: Secondary | ICD-10-CM | POA: Diagnosis not present

## 2022-11-25 DIAGNOSIS — I10 Essential (primary) hypertension: Secondary | ICD-10-CM | POA: Diagnosis present

## 2022-11-25 DIAGNOSIS — F32A Depression, unspecified: Secondary | ICD-10-CM | POA: Diagnosis present

## 2022-11-25 DIAGNOSIS — M1712 Unilateral primary osteoarthritis, left knee: Secondary | ICD-10-CM | POA: Diagnosis present

## 2022-11-25 LAB — BASIC METABOLIC PANEL
Anion gap: 9 (ref 5–15)
BUN: 11 mg/dL (ref 6–20)
CO2: 28 mmol/L (ref 22–32)
Calcium: 9.9 mg/dL (ref 8.9–10.3)
Chloride: 102 mmol/L (ref 98–111)
Creatinine, Ser: 0.77 mg/dL (ref 0.44–1.00)
GFR, Estimated: >60 mL/min (ref >60)
Glucose, Bld: 94 mg/dL (ref 70–99)
Potassium: 3.5 mmol/L (ref 3.5–5.1)
Sodium: 139 mmol/L (ref 135–145)

## 2022-11-25 LAB — HEPATIC FUNCTION PANEL
ALT: <5 U/L (ref 0–44)
AST: 10 U/L — ABNORMAL LOW (ref 15–41)
Albumin: 4.1 g/dL (ref 3.5–5.0)
Alkaline Phosphatase: 65 U/L (ref 38–126)
Bilirubin, Direct: 0.2 mg/dL (ref 0.0–0.2)
Indirect Bilirubin: 0.7 mg/dL (ref 0.3–0.9)
Total Bilirubin: 0.9 mg/dL (ref 0.3–1.2)
Total Protein: 7.9 g/dL (ref 6.5–8.1)

## 2022-11-25 LAB — CBC
HCT: 36.9 % (ref 36.0–46.0)
Hemoglobin: 11.3 g/dL — ABNORMAL LOW (ref 12.0–15.0)
MCH: 27.8 pg (ref 26.0–34.0)
MCHC: 30.6 g/dL (ref 30.0–36.0)
MCV: 90.7 fL (ref 80.0–100.0)
Platelets: 117 10*3/uL — ABNORMAL LOW (ref 150–400)
RBC: 4.07 MIL/uL (ref 3.87–5.11)
RDW: 14.6 % (ref 11.5–15.5)
WBC: 5.6 10*3/uL (ref 4.0–10.5)
nRBC: 0 % (ref 0.0–0.2)

## 2022-11-25 LAB — RESP PANEL BY RT-PCR (RSV, FLU A&B, COVID)  RVPGX2
Influenza A by PCR: NEGATIVE
Influenza B by PCR: NEGATIVE
Resp Syncytial Virus by PCR: NEGATIVE
SARS Coronavirus 2 by RT PCR: NEGATIVE

## 2022-11-25 MED ORDER — MECLIZINE HCL 25 MG PO TABS
25.0000 mg | ORAL_TABLET | Freq: Once | ORAL | Status: AC
  Administered 2022-11-25: 25 mg via ORAL
  Filled 2022-11-25: qty 1

## 2022-11-25 MED ORDER — DIAZEPAM 5 MG/ML IJ SOLN
2.5000 mg | Freq: Once | INTRAMUSCULAR | Status: AC
  Administered 2022-11-26: 2.5 mg via INTRAVENOUS
  Filled 2022-11-25 (×2): qty 2

## 2022-11-25 MED ORDER — SODIUM CHLORIDE 0.9 % IV BOLUS
1000.0000 mL | Freq: Once | INTRAVENOUS | Status: AC
  Administered 2022-11-26: 1000 mL via INTRAVENOUS

## 2022-11-25 NOTE — ED Triage Notes (Signed)
Pt c/o dizziness/ fatigue x2wks, states that this AM 10-10:30AM, "woke up & felt real dizzy, stayed longer than it should." States that she thinks she needs some blood because she is anemic. Pt endorses 9/10 HA pain, denies blurred vision, neuro deficit.

## 2022-11-25 NOTE — Telephone Encounter (Signed)
Called & spoke to the patient. Verified name & DOB. Informed patient that insurance subsequently approved continuing her Ozempic medication. Therefore, there is no need to change to her metformin medication or to use Trulicity.  Called & spoke to [place] at the [employer] [facility] confirmed the cancellation of the Trulicity medication.

## 2022-11-25 NOTE — Telephone Encounter (Signed)
Copied from CRM [id number]. Topic: General - Other >> Nov 25, 2022 10:33 AM Everette C wrote: Reason for CRM: The patient has called to continue ongoing discussions related to their recently changed prescriptions to their Trulicity  Please contact the patient further when possible

## 2022-11-25 NOTE — ED Notes (Signed)
Received report from carelink.

## 2022-11-25 NOTE — Telephone Encounter (Signed)
Chief Complaint: General Fatigue Symptoms: Generalized fatigue and dizziness Frequency: onset today around 10am, constant  Pertinent Negatives: Patient denies SOB, chest pain, nausea, vomiting Disposition: [x] ED /[] Urgent Care (no appt availability in office) / [] Appointment(In office/virtual)/ []  [facility]/ [] Home Care/ [] Refused Recommended Disposition /[] [facility]/ []  Follow-up with PCP Additional Notes: Patient reports feeling fatigue today with dizziness when she stands. Patient reports being Anemia and requiring blood transfusions in the past. Patient states that she just wants to lay around all day and barely has energy to do anything. Advised patient that she would need to be evaluated. No appointments available in the office, offered patient an urgent care appointment and patient declined. Patient stated that she feels more comfortable going to [facility]. I explained that it was the ED and I would not be able to schedule an appointment there. Patient stated that she would go and wait to be seen. Her brother is available to take her.  Summary: dizziness and tired   Pt called in is having dizziness and tired.     Reason for Disposition  [1] MODERATE weakness (i.e., interferes with work, school, normal activities) AND [2] cause unknown  (Exceptions: Weakness from acute minor illness or poor fluid intake; weakness is chronic and not worse.)  Answer Assessment - Initial Assessment Questions 1. DESCRIPTION: "Describe how you are feeling."     Fatigue I just want to lay around, I don't have energy to much. 2. SEVERITY: "How bad is it?"  "Can you stand and walk?"   - MILD (0-3): Feels weak or tired, but does not interfere with work, school or normal activities.   - MODERATE (4-7): Able to stand and walk; weakness interferes with work, school, or normal activities.   - SEVERE (8-10): Unable to stand or walk; unable to do usual activities.     8/10 fatigue  3.  ONSET: "When did these symptoms begin?" (e.g., hours, days, weeks, months)     10am today 4. CAUSE: "What do you think is causing the weakness or fatigue?" (e.g., not drinking enough fluids, medical problem, trouble sleeping)     Anemia 5. NEW MEDICINES:  "Have you started on any new medicines recently?" (e.g., opioid pain medicines, benzodiazepines, muscle relaxants, antidepressants, antihistamines, neuroleptics, beta blockers)     No 6. OTHER SYMPTOMS: "Do you have any other symptoms?" (e.g., chest pain, fever, cough, SOB, vomiting, diarrhea, bleeding, other areas of pain)     Dizziness  Protocols used: Weakness (Generalized) and Fatigue-A-AH

## 2022-11-25 NOTE — Telephone Encounter (Signed)
Noted  

## 2022-11-25 NOTE — Telephone Encounter (Signed)
Called & spoke to the patient. Verified name & DOB. Informed patient that insurance subsequently approved continuing her Ozempic medication. Therefore, there is no need to change to her metformin medication or to use Trulicity.  Called & spoke to [place] at the [employer] [facility] confirmed the cancellation of the Trulicity medication. Please see previous telephone encounter for further information.

## 2022-11-25 NOTE — ED Provider Notes (Addendum)
[place] EMERGENCY DEPARTMENT AT [facility] Provider Note   CSN: [id number] Arrival date & time: 11/25/22  1512     History  Chief Complaint  Patient presents with   Dizziness    [name] is a 52 y.o. female.  Patient here with dizziness inability to ambulate.  Symptoms got worse this morning.  Maybe some episodes here in the last 2 weeks but she has been to unstable and dizzy to walk.  She denies any ear pain.  She has history of anemia.  History of stroke.  History of DVT on Eliquis.  She denies any fever chills or falls.  Denies any vision loss numbness weakness or tingling.  Feels like movement makes it worse.  The history is provided by the patient.       Home Medications Prior to Admission medications   Medication Sig Start Date End Date Taking? Authorizing Provider  Accu-Chek Softclix Lancets lancets Use to check blood sugar once daily. E11.69 05/09/22   Marcine Matar, MD  acetaminophen (TYLENOL) 500 MG tablet Take 1 tablet (500 mg total) by mouth every 6 (six) hours as needed. Patient taking differently: Take 500 mg by mouth every 6 (six) hours as needed for mild pain. 04/19/20   Marcine Matar, MD  acetaminophen (TYLENOL) 650 MG CR tablet Take 650 mg by mouth every 8 (eight) hours as needed for pain.    [provider]  albuterol (PROAIR HFA) 108 (90 Base) MCG/ACT inhaler INHALE TWO PUFFS BY MOUTH EVERY 6 HOURS AS NEEDED FOR WHEEZING OR SHORTNESS OF BREATH 03/11/22   Shahmehdi, Gemma Payor, MD  AMBULATORY NON FORMULARY MEDICATION nitroglycerin 0.125% gel apply a pea size amount to your rectum three times daily x 6-8 weeks. 08/14/22   Jenel Lucks, MD  amLODipine (NORVASC) 5 MG tablet Take 0.5 tablets (2.5 mg total) by mouth daily. 09/05/22   Marcine Matar, MD  atorvastatin (LIPITOR) 40 MG tablet Take 1 tablet (40 mg total) by mouth daily. 11/22/21   Marcine Matar, MD  Blood Glucose Calibration (ACCU-CHEK GUIDE CONTROL)  LIQD 1 each by In Vitro route daily. 12/13/20   Anders Simmonds, PA-C  Blood Glucose Monitoring Suppl (ACCU-CHEK GUIDE) w/Device KIT Use to check blood sugar once daily. E11.69 05/09/22   Marcine Matar, MD  Blood Glucose Monitoring Suppl (ACCU-CHEK GUIDE) w/Device KIT 1 each by Does not apply route 2 (two) times daily. 05/09/22   Marcine Matar, MD  busPIRone (BUSPAR) 7.5 MG tablet Take 1 tablet by mouth 2 (two) times daily.    [provider]  Carboxymethylcellulose Sodium (EYE DROPS OP) Place 1 drop into both eyes daily as needed (dry eyes).    [provider]  carvedilol (COREG) 25 MG tablet TAKE 1 TABLET BY MOUTH TWICE A DAY WITH A MEAL 06/07/22   Marcine Matar, MD  cholecalciferol (VITAMIN D3) 25 MCG (1000 UNIT) tablet Take 1 tablet (1,000 Units total) by mouth daily. 04/07/20   Marcine Matar, MD  diclofenac Sodium (VOLTAREN) 1 % GEL Apply 2 g topically 4 (four) times daily. 05/07/22   Marcine Matar, MD  DULERA 200-5 MCG/ACT AERO INHALE 2 PUFFS EVERY MORNING AND 2 PUFFS EVERY NIGHT AT BEDTIME 06/04/22   Marcine Matar, MD  ELIQUIS 5 MG TABS tablet TAKE 1 TABLET BY MOUTH TWICE A DAY 07/02/22   Marcine Matar, MD  FARXIGA 10 MG TABS tablet TAKE 1 TABLET BY MOUTH DAILY 02/11/22  Marcine Matar, MD  FEROSUL 325 (65 Fe) MG tablet TAKE ONE TABLET BY MOUTH DAILY WITH BREAKFAST 06/06/21   Marcine Matar, MD  furosemide (LASIX) 80 MG tablet Take 1 tablet (80 mg total) by mouth daily. 09/05/22   Marcine Matar, MD  gabapentin (NEURONTIN) 100 MG capsule TAKE ONE CAPSULE BY MOUTH THREE TIMES A DAY 09/04/22   Marcine Matar, MD  glucose blood (ACCU-CHEK GUIDE) test strip Use to check blood sugar once daily. E11.69 05/09/22   Marcine Matar, MD  hydrocortisone cream 1 % Apply 1 application topically daily as needed for itching.    [provider]  hydrOXYzine (VISTARIL) 25 MG capsule Take 1 capsule (25 mg total) by mouth at bedtime as  needed. Patient taking differently: Take 25 mg by mouth at bedtime as needed for anxiety or itching. 04/20/21   Marcine Matar, MD  methocarbamol (ROBAXIN) 500 MG tablet TAKE 1 TABLET BY MOUTH TWICE A DAY AS NEEDED FOR MUSCLE SPASMS 11/15/22   Marcine Matar, MD  methylPREDNISolone (MEDROL DOSEPAK) 4 MG TBPK tablet Medrol Dosepak take as instructed 03/11/22   Kendell Bane, MD  neomycin-bacitracin-polymyxin [phone] OINT Apply 1 Application topically 2 (two) times daily. 03/11/22   Shahmehdi, Gemma Payor, MD  nitroGLYCERIN (NITROSTAT) 0.4 MG SL tablet Place 1 tablet (0.4 mg total) under the tongue every 5 (five) minutes as needed for chest pain. 11/22/21   Marcine Matar, MD  PARoxetine (PAXIL) 20 MG tablet TAKE 1 TABLET BY MOUTH DAILY 06/04/22   Marcine Matar, MD  polyethylene glycol powder (GLYCOLAX/MIRALAX) 17 GM/SCOOP powder Take 17 g by mouth daily as needed for mild constipation. 05/07/22   Marcine Matar, MD  potassium chloride SA (KLOR-CON M20) 20 MEQ tablet Take 1 tablet (20 mEq total) by mouth daily. 11/22/21   Marcine Matar, MD  Semaglutide, 1 MG/DOSE, 4 MG/3ML SOPN Inject 1 mg as directed once a week. 11/22/22   Marcine Matar, MD  spironolactone (ALDACTONE) 25 MG tablet TAKE 1 TABLET BY MOUTH DAILY 06/07/22   Marcine Matar, MD  vitamin B-12 (CYANOCOBALAMIN) 1000 MCG tablet Take 1,000 mcg by mouth daily.    [provider]      Allergies    Ace inhibitors, Aspirin, Hydromorphone, Vancomycin, Contrast media [iodinated contrast media], Dilaudid [hydromorphone hcl], and Lidocaine    Review of Systems   Review of Systems  Physical Exam Updated Vital Signs BP (!) 145/93 (BP Location: Right Arm)   Pulse 80   Temp 98.2 F (36.8 C)   Resp 18   SpO2 100%  Physical Exam Vitals and nursing note reviewed.  Constitutional:      General: She is not in acute distress.    Appearance: She is well-developed. She is not ill-appearing.  HENT:     Head:  Normocephalic and atraumatic.     Nose: Nose normal.     Mouth/Throat:     Mouth: Mucous membranes are moist.  Eyes:     Extraocular Movements: Extraocular movements intact.     Conjunctiva/sclera: Conjunctivae normal.     Pupils: Pupils are equal, round, and reactive to light.  Cardiovascular:     Rate and Rhythm: Normal rate and regular rhythm.     Pulses: Normal pulses.     Heart sounds: Normal heart sounds. No murmur heard. Pulmonary:     Effort: Pulmonary effort is normal. No respiratory distress.     Breath sounds: Normal breath sounds.  Abdominal:     Palpations: Abdomen is soft.     Tenderness: There is no abdominal tenderness.  Musculoskeletal:        General: No swelling.     Cervical back: Normal range of motion and neck supple.  Skin:    General: Skin is warm and dry.     Capillary Refill: Capillary refill takes less than 2 seconds.  Neurological:     General: No focal deficit present.     Mental Status: She is alert.     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     ED Results / Procedures / Treatments   Labs (all labs ordered are listed, but only abnormal results are displayed) Labs Reviewed  CBC - Abnormal; Notable for the following components:      Result Value   Hemoglobin 11.3 (*)    Platelets 117 (*)    All other components within normal limits  HEPATIC FUNCTION PANEL - Abnormal; Notable for the following components:   AST 10 (*)    All other components within normal limits  BASIC METABOLIC PANEL  CBG MONITORING, ED    EKG EKG Interpretation Date/Time:  Monday November 25 2022 15:38:44 EDT Ventricular Rate:  98 PR Interval:  152 QRS Duration:  94 QT  Interval:  362 QTC Calculation: 462 R Axis:   -13  Text Interpretation: Sinus rhythm with occasional Premature ventricular complexes Moderate voltage criteria for LVH, may be normal variant ( R in aVL , Cornell product ) Nonspecific T wave abnormality Prolonged QT Abnormal ECG When compared with ECG of 05-Mar-2022 16:21, No significant change was found Confirmed by Virgina Norfolk [phone] on 11/25/2022 3:40:52 PM  Radiology CT HEAD WO CONTRAST  Result Date: 11/25/2022 CLINICAL DATA:  Sudden onset headache and dizziness. Fatigue for 2 weeks. EXAM: CT HEAD WITHOUT CONTRAST TECHNIQUE: Contiguous axial images were obtained from the base of the skull through the vertex without intravenous contrast. RADIATION DOSE REDUCTION: This exam was performed according to the departmental dose-optimization program which includes automated exposure control, adjustment of the mA and/or kV according to patient size and/or use of iterative reconstruction technique. COMPARISON:  09/21/2020 FINDINGS: Brain: There is no evidence for acute hemorrhage, hydrocephalus, mass lesion, or abnormal extra-axial fluid collection. No definite CT evidence for acute infarction. Stable small left frontal infarct. Vascular: No hyperdense vessel or unexpected calcification. Skull: No evidence for fracture. No worrisome lytic or sclerotic lesion. Sinuses/Orbits: The visualized paranasal sinuses and mastoid air cells are clear. Visualized portions of the globes and intraorbital fat are unremarkable. Other: None. IMPRESSION: 1. No acute intracranial abnormality. 2. Stable small left frontal infarct. Electronically Signed   By: Kennith Center M.D.   On: 11/25/2022 17:22    Procedures Procedures    Medications Ordered in ED Medications  sodium chloride 0.9 % bolus 1,000 mL (has no administration in time range)  meclizine (ANTIVERT) tablet 25 mg (has no administration in time range)  diazepam (VALIUM) injection 2.5 mg (has no administration in  time range)    ED Course/ Medical Decision Making/ A&P  Medical Decision Making Amount and/or Complexity of Data Reviewed Labs: ordered. Radiology: ordered.  Risk Prescription drug management.   [name] is here with dizziness.  Unremarkable vitals.  No fever.  EKG shows sinus rhythm.  No ischemic changes.  She is on Eliquis for blood clot.  History of stroke.  History of CAD.  She not have any chest pain or shortness of breath.  Neuroexam is overall unremarkable.  She is too symptomatic to ambulate.  She has CBC BMP and head CT done that were overall unremarkable.  No significant anemia or electrolyte abnormality.  Head CT with no acute process.  Overall she is too dizzy to ambulate.  I have low suspicion for stroke but think it would be necessary to get an MRI to further evaluate.  I will give her fluids Valium and meclizine.  If MRI is negative and she is feeling better I suspect we can treat for inner ear problem.  If she cannot ambulate despite negative MRI she may need further admission forVestibular therapy.  Dr. Karene Fry accepts.  Will have her transferred via ambulance.  This chart was dictated using voice recognition software.  Despite best efforts to proofread,  errors can occur which can change the documentation meaning.         Final Clinical Impression(s) / ED Diagnoses Final diagnoses:  Dizziness    Rx / DC Orders ED Discharge Orders     None         Virgina Norfolk, DO 11/25/22 1900    Virgina Norfolk, DO 11/25/22 1926

## 2022-11-26 DIAGNOSIS — R42 Dizziness and giddiness: Secondary | ICD-10-CM | POA: Diagnosis not present

## 2022-11-26 DIAGNOSIS — R29818 Other symptoms and signs involving the nervous system: Secondary | ICD-10-CM | POA: Diagnosis not present

## 2022-11-26 LAB — CBC WITH DIFFERENTIAL/PLATELET
Abs Immature Granulocytes: 0.02 10*3/uL (ref 0.00–0.07)
Basophils Absolute: 0 10*3/uL (ref 0.0–0.1)
Basophils Relative: 0 %
Eosinophils Absolute: 0.1 10*3/uL (ref 0.0–0.5)
Eosinophils Relative: 2 %
HCT: 33.1 % — ABNORMAL LOW (ref 36.0–46.0)
Hemoglobin: 9.7 g/dL — ABNORMAL LOW (ref 12.0–15.0)
Immature Granulocytes: 0 %
Lymphocytes Relative: 12 %
Lymphs Abs: 0.7 10*3/uL (ref 0.7–4.0)
MCH: 27.2 pg (ref 26.0–34.0)
MCHC: 29.3 g/dL — ABNORMAL LOW (ref 30.0–36.0)
MCV: 93 fL (ref 80.0–100.0)
Monocytes Absolute: 0.5 10*3/uL (ref 0.1–1.0)
Monocytes Relative: 9 %
Neutro Abs: 4.5 10*3/uL (ref 1.7–7.7)
Neutrophils Relative %: 77 %
Platelets: 99 10*3/uL — ABNORMAL LOW (ref 150–400)
RBC: 3.56 MIL/uL — ABNORMAL LOW (ref 3.87–5.11)
RDW: 14.4 % (ref 11.5–15.5)
WBC: 5.9 10*3/uL (ref 4.0–10.5)
nRBC: 0 % (ref 0.0–0.2)

## 2022-11-26 LAB — COMPREHENSIVE METABOLIC PANEL
ALT: 8 U/L (ref 0–44)
AST: 12 U/L — ABNORMAL LOW (ref 15–41)
Albumin: 2.9 g/dL — ABNORMAL LOW (ref 3.5–5.0)
Alkaline Phosphatase: 54 U/L (ref 38–126)
Anion gap: 11 (ref 5–15)
BUN: 10 mg/dL (ref 6–20)
CO2: 25 mmol/L (ref 22–32)
Calcium: 9 mg/dL (ref 8.9–10.3)
Chloride: 103 mmol/L (ref 98–111)
Creatinine, Ser: 0.84 mg/dL (ref 0.44–1.00)
GFR, Estimated: >60 mL/min (ref >60)
Glucose, Bld: 102 mg/dL — ABNORMAL HIGH (ref 70–99)
Potassium: 3 mmol/L — ABNORMAL LOW (ref 3.5–5.1)
Sodium: 139 mmol/L (ref 135–145)
Total Bilirubin: 0.7 mg/dL (ref 0.3–1.2)
Total Protein: 6.5 g/dL (ref 6.5–8.1)

## 2022-11-26 LAB — GLUCOSE, CAPILLARY
Glucose-Capillary: 88 mg/dL (ref 70–99)
Glucose-Capillary: 113 mg/dL — ABNORMAL HIGH (ref 70–99)

## 2022-11-26 LAB — MAGNESIUM: Magnesium: 1.8 mg/dL (ref 1.7–2.4)

## 2022-11-26 MED ORDER — ENOXAPARIN SODIUM 40 MG/0.4ML IJ SOSY: 40.0000 mg | PREFILLED_SYRINGE | INTRAMUSCULAR | Status: DC

## 2022-11-26 MED ORDER — MELATONIN 3 MG PO TABS: 3.0000 mg | ORAL_TABLET | Freq: Every evening | ORAL | Status: DC | PRN

## 2022-11-26 MED ORDER — OXYCODONE-ACETAMINOPHEN 5-325 MG PO TABS
1.0000 | ORAL_TABLET | Freq: Once | ORAL | Status: AC
  Administered 2022-11-26: 1 via ORAL
  Filled 2022-11-26: qty 1

## 2022-11-26 MED ORDER — APIXABAN 5 MG PO TABS: 5.0000 mg | ORAL_TABLET | Freq: Two times a day (BID) | ORAL | Status: DC

## 2022-11-26 MED ORDER — APIXABAN 5 MG PO TABS
5.0000 mg | ORAL_TABLET | Freq: Two times a day (BID) | ORAL | Status: DC
  Administered 2022-11-26 – 2022-11-30 (×9): 5 mg via ORAL
  Filled 2022-11-26 (×9): qty 1

## 2022-11-26 MED ORDER — BISACODYL 5 MG PO TBEC: 5.0000 mg | DELAYED_RELEASE_TABLET | Freq: Every day | ORAL | Status: DC | PRN

## 2022-11-26 MED ORDER — SPIRONOLACTONE 25 MG PO TABS
25.0000 mg | ORAL_TABLET | Freq: Every day | ORAL | Status: DC
  Administered 2022-11-26 – 2022-11-27 (×2): 25 mg via ORAL
  Filled 2022-11-26 (×2): qty 1

## 2022-11-26 MED ORDER — CARVEDILOL 25 MG PO TABS
25.0000 mg | ORAL_TABLET | Freq: Two times a day (BID) | ORAL | Status: DC
  Administered 2022-11-26 – 2022-11-28 (×4): 25 mg via ORAL
  Filled 2022-11-26 (×5): qty 1

## 2022-11-26 MED ORDER — HYDROXYZINE HCL 50 MG PO TABS: 25.0000 mg | ORAL_TABLET | Freq: Every evening | ORAL | Status: DC | PRN

## 2022-11-26 MED ORDER — METHOCARBAMOL 500 MG PO TABS
500.0000 mg | ORAL_TABLET | Freq: Two times a day (BID) | ORAL | Status: DC | PRN
  Administered 2022-11-26 – 2022-11-30 (×4): 500 mg via ORAL
  Filled 2022-11-26 (×4): qty 1

## 2022-11-26 MED ORDER — ONDANSETRON HCL 4 MG/2ML IJ SOLN: 4.0000 mg | Freq: Four times a day (QID) | INTRAMUSCULAR | Status: DC | PRN

## 2022-11-26 MED ORDER — DOCUSATE SODIUM 100 MG PO CAPS
100.0000 mg | ORAL_CAPSULE | Freq: Two times a day (BID) | ORAL | Status: DC
  Administered 2022-11-26 – 2022-11-30 (×8): 100 mg via ORAL
  Filled 2022-11-26 (×8): qty 1

## 2022-11-26 MED ORDER — INSULIN ASPART 100 UNIT/ML IJ SOLN: 0.0000 [IU] | Freq: Every day | INTRAMUSCULAR | Status: DC

## 2022-11-26 MED ORDER — OXYCODONE HCL 5 MG PO TABS
5.0000 mg | ORAL_TABLET | ORAL | Status: DC | PRN
  Administered 2022-11-26 – 2022-11-30 (×8): 5 mg via ORAL
  Filled 2022-11-26 (×8): qty 1

## 2022-11-26 MED ORDER — METOCLOPRAMIDE HCL 5 MG/ML IJ SOLN
10.0000 mg | Freq: Once | INTRAMUSCULAR | Status: AC
  Administered 2022-11-26: 10 mg via INTRAVENOUS
  Filled 2022-11-26: qty 2

## 2022-11-26 MED ORDER — FERROUS SULFATE 325 (65 FE) MG PO TABS
325.0000 mg | ORAL_TABLET | Freq: Every day | ORAL | Status: DC
  Administered 2022-11-26 – 2022-11-30 (×5): 325 mg via ORAL
  Filled 2022-11-26 (×5): qty 1

## 2022-11-26 MED ORDER — POLYETHYLENE GLYCOL 3350 17 G PO PACK: 17.0000 g | PACK | Freq: Every day | ORAL | Status: DC | PRN

## 2022-11-26 MED ORDER — MECLIZINE HCL 25 MG PO TABS
25.0000 mg | ORAL_TABLET | Freq: Three times a day (TID) | ORAL | Status: DC | PRN
  Administered 2022-11-27 – 2022-11-30 (×7): 25 mg via ORAL
  Filled 2022-11-26 (×9): qty 1

## 2022-11-26 MED ORDER — INSULIN ASPART 100 UNIT/ML IJ SOLN
0.0000 [IU] | Freq: Three times a day (TID) | INTRAMUSCULAR | Status: DC
  Administered 2022-11-27 – 2022-11-30 (×4): 2 [IU] via SUBCUTANEOUS

## 2022-11-26 MED ORDER — POTASSIUM CHLORIDE CRYS ER 20 MEQ PO TBCR
40.0000 meq | EXTENDED_RELEASE_TABLET | ORAL | Status: AC
  Administered 2022-11-26: 40 meq via ORAL
  Filled 2022-11-26: qty 2

## 2022-11-26 MED ORDER — ACETAMINOPHEN 325 MG PO TABS
650.0000 mg | ORAL_TABLET | Freq: Four times a day (QID) | ORAL | Status: DC | PRN
  Administered 2022-11-26: 650 mg via ORAL
  Filled 2022-11-26: qty 2

## 2022-11-26 MED ORDER — SODIUM CHLORIDE 0.9% FLUSH
3.0000 mL | Freq: Two times a day (BID) | INTRAVENOUS | Status: DC
  Administered 2022-11-26 – 2022-11-30 (×8): 3 mL via INTRAVENOUS

## 2022-11-26 MED ORDER — ACETAMINOPHEN 650 MG RE SUPP: 650.0000 mg | Freq: Four times a day (QID) | RECTAL | Status: DC | PRN

## 2022-11-26 MED ORDER — GABAPENTIN 100 MG PO CAPS
100.0000 mg | ORAL_CAPSULE | Freq: Three times a day (TID) | ORAL | Status: DC
  Administered 2022-11-26 – 2022-11-30 (×13): 100 mg via ORAL
  Filled 2022-11-26 (×13): qty 1

## 2022-11-26 MED ORDER — MOMETASONE FURO-FORMOTEROL FUM 200-5 MCG/ACT IN AERO
2.0000 | INHALATION_SPRAY | Freq: Two times a day (BID) | RESPIRATORY_TRACT | Status: DC
  Administered 2022-11-26 – 2022-11-30 (×8): 2 via RESPIRATORY_TRACT
  Filled 2022-11-26: qty 8.8

## 2022-11-26 MED ORDER — ATORVASTATIN CALCIUM 40 MG PO TABS
40.0000 mg | ORAL_TABLET | Freq: Every day | ORAL | Status: DC
  Administered 2022-11-26 – 2022-11-30 (×5): 40 mg via ORAL
  Filled 2022-11-26 (×3): qty 1
  Filled 2022-11-26: qty 4
  Filled 2022-11-26: qty 1

## 2022-11-26 MED ORDER — PAROXETINE HCL 20 MG PO TABS
20.0000 mg | ORAL_TABLET | Freq: Every day | ORAL | Status: DC
  Administered 2022-11-26 – 2022-11-30 (×5): 20 mg via ORAL
  Filled 2022-11-26 (×5): qty 1

## 2022-11-26 MED ORDER — HYDRALAZINE HCL 20 MG/ML IJ SOLN: 5.0000 mg | INTRAMUSCULAR | Status: DC | PRN

## 2022-11-26 NOTE — ED Notes (Signed)
ED TO INPATIENT HANDOFF REPORT  ED Nurse Name and Phone #: Marcelino Duster [phone]  S Name/Age/Gender [name] 52 y.o. female Room/Bed: 040C/040C  Code Status   Code Status: Full Code  Home/SNF/Other Home Patient oriented to: self, place, time, and situation Is this baseline? Yes   Triage Complete: Triage complete  Chief Complaint Vertigo [R42]  Triage Note Pt c/o dizziness/ fatigue x2wks, states that this AM 10-10:30AM, "woke up & felt real dizzy, stayed longer than it should." States that she thinks she needs some blood because she is anemic. Pt endorses 9/10 HA pain, denies blurred vision, neuro deficit.   Allergies Allergies  Allergen Reactions   Ace Inhibitors Rash and Other (See Comments)    Make pt bleed   Aspirin Other (See Comments)    Per patient paperwork: blood clot?  Likely because of chronic DOAC   Hydromorphone Hives and Itching   Vancomycin Itching and Rash   Contrast Media [Iodinated Contrast Media] Hives   Dilaudid [Hydromorphone Hcl] Hives   Lidocaine Itching    Itching with Lidocaine patch reported 06/13/2021 vis telephone message.    Level of Care/Admitting Diagnosis ED Disposition     ED Disposition  Admit   Condition  --   Comment  Hospital Area: [facility] [[id number]]  Level of Care: Telemetry Medical [[id number]]  May place patient in observation at [facility] or [facility] if equivalent level of care is available:: No  Covid Evaluation: Asymptomatic - no recent exposure (last 10 days) testing not required  Diagnosis: Vertigo [207257]  Admitting Physician: Angie Fava [[id number]]  Attending Physician: Angie Fava [[id number]]          B Medical/Surgery History Past Medical History:  Diagnosis Date   (HFpEF) heart failure with preserved ejection fraction (HCC) -> although echo suggests normal diastolic parameters with normal left atrial size 04/13/2020   Arthritis    Cellulitis    COPD (chronic  obstructive pulmonary disease) (HCC)    Coronary artery disease, non-occlusive    OBSTRUCTIVE: PT STATES - HAD A CARDIAC CATH - NOT TOLD SHE HAD CAD -> week note from [place] indicates history of MI (patient cannot corroborate   Diabetes mellitus without complication (HCC)    DVT (deep venous thrombosis) (HCC) 09/17/2017   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   Morbid obesity due to excess calories (HCC)    OSA (obstructive sleep apnea) 07/11/2021   Sleep study April 06, 2019 (Dr. Craige Cotta): Moderate OSA (AHI of 24.4 and SPO2 low of 77%).  Majority events during REM sleep.-recommend CPAP, oral appliance or surgical. Reviewed sleep hygiene.  Avoid sedatives.     Osteoarthritis of left knee    Prediabetes    Small bowel obstruction (HCC)    as a child   Speech impediment    Stutter / stammer   Past Surgical History:  Procedure Laterality Date   ABDOMINAL WALL DEFECT REPAIR  1970   IR CV LINE INJECTION  10/24/2020   IR REMOVAL TUN ACCESS W/ PORT W/O FL MOD SED  05/28/2021   IVC FILTER INSERTION  2017  Lower Extremity Venous Duplex  06/23/2020   No evidence of DVT or superficial thrombosis bilaterally.  No evidence of deep venous insufficiency bilaterally.  No evidence of SSV reflux.  Right GSV in the calf has reflux, no reflux in L GSV.;  Repeated in July 2020-no DVT   OOPHORECTOMY  1996   OOPHORECTOMY  1997   PORTACATH PLACEMENT  2014   TRANSTHORACIC ECHOCARDIOGRAM  05/18/2021   EF 60 to 65%.  No RWMA.  Mild concentric LVH.  "Normal diastolic parameters ".  Normal longitudinal strain.  Normal PAP, RAP.  Normal aortic and mitral  valves.==> In July 2022, echo read as GR 1 DD otherwise stable.     A IV Location/Drains/Wounds Patient Lines/Drains/Airways Status     Active Line/Drains/Airways     Name Placement date Placement time Site Days   Peripheral IV 11/26/22 22 G 1" Left Antecubital 11/26/22  0113  Antecubital  less than 1            Intake/Output Last 24 hours No intake or output data in the 24 hours ending 11/26/22 1212  Labs/Imaging Results for orders placed or performed during the hospital encounter of 11/25/22 (from the past 48 hour(s))  Basic metabolic panel     Status: None   Collection Time: 11/25/22  3:24 PM  Result Value Ref Range   Sodium 139 135 - 145 mmol/L   Potassium 3.5 3.5 - 5.1 mmol/L   Chloride 102 98 - 111 mmol/L   CO2 28 22 - 32 mmol/L   Glucose, Bld 94 70 - 99 mg/dL    Comment: Glucose reference range applies only to samples taken after fasting for at least 8 hours.   BUN 11 6 - 20 mg/dL   Creatinine, Ser 1.32 0.44 - 1.00 mg/dL   Calcium 9.9 8.9 - 44.0 mg/dL   GFR, Estimated >10 >27 mL/min    Comment: (NOTE) Calculated using the CKD-EPI Creatinine Equation (2021)    Anion gap 9 5 - 15    Comment: Performed at [facility], [address], [place], [place] [address]  CBC     Status: Abnormal   Collection Time: 11/25/22  3:24 PM  Result Value Ref Range   WBC 5.6 4.0 - 10.5 K/uL   RBC 4.07 3.87 - 5.11 MIL/uL   Hemoglobin 11.3 (L) 12.0 - 15.0 g/dL   HCT 44.0 34.7 - 42.5 %   MCV 90.7 80.0 - 100.0 fL   MCH 27.8 26.0 - 34.0 pg   MCHC 30.6 30.0 - 36.0 g/dL   RDW 95.6 38.7 - 56.4 %   Platelets 117 (L) 150 - 400 K/uL   nRBC 0.0 0.0 - 0.2 %    Comment: Performed at [facility], [name] [place], [place] [address]  Hepatic function panel     Status: Abnormal   Collection Time: 11/25/22  3:24 PM  Result Value Ref Range   Total Protein 7.9 6.5 - 8.1 g/dL   Albumin 4.1 3.5 - 5.0 g/dL   AST 10 (L) 15 - 41 U/L   ALT <5 0 - 44  U/L   Alkaline Phosphatase 65 38 - 126 U/L   Total Bilirubin 0.9 0.3 - 1.2 mg/dL   Bilirubin, Direct 0.2 0.0 - 0.2 mg/dL   Indirect Bilirubin 0.7 0.3 - 0.9 mg/dL    Comment: Performed at [facility], [address], [place], [place] [address]  Resp panel by RT-PCR (RSV, Flu A&B, Covid) Anterior Nasal Swab  Status: None   Collection Time: 11/25/22  7:29 PM   Specimen: Anterior Nasal Swab  Result Value Ref Range   SARS Coronavirus 2 by RT PCR NEGATIVE NEGATIVE    Comment: (NOTE) SARS-CoV-2 target nucleic acids are NOT DETECTED.  The SARS-CoV-2 RNA is generally detectable in upper respiratory specimens during the acute phase of infection. The lowest concentration of SARS-CoV-2 viral copies this assay can detect is 138 copies/mL. A negative result does not preclude SARS-Cov-2 infection and should not be used as the sole basis for treatment or other patient management decisions. A negative result may occur with  improper specimen collection/handling, submission of specimen other than nasopharyngeal swab, presence of viral mutation(s) within the areas targeted by this assay, and inadequate number of viral copies(<138 copies/mL). A negative result must be combined with clinical observations, patient history, and epidemiological information. The expected result is Negative.  Fact Sheet for Patients:  BloggerCourse.com  Fact Sheet for Healthcare Providers:  SeriousBroker.it  This test is no t yet approved or cleared by the [place] FDA and  has been authorized for detection and/or diagnosis of SARS-CoV-2 by FDA under an Emergency Use Authorization (EUA). This EUA will remain  in effect (meaning this test can be used) for the duration of the COVID-19 declaration under Section 564(b)(1) of the Act, 21 U.S.C.section 360bbb-3(b)(1), unless the authorization is terminated  or revoked sooner.       Influenza A  by PCR NEGATIVE NEGATIVE   Influenza B by PCR NEGATIVE NEGATIVE    Comment: (NOTE) The Xpert Xpress SARS-CoV-2/FLU/RSV plus assay is intended as an aid in the diagnosis of influenza from Nasopharyngeal swab specimens and should not be used as a sole basis for treatment. Nasal washings and aspirates are unacceptable for Xpert Xpress SARS-CoV-2/FLU/RSV testing.  Fact Sheet for Patients: BloggerCourse.com  Fact Sheet for Healthcare Providers: SeriousBroker.it  This test is not yet approved or cleared by the [place] FDA and has been authorized for detection and/or diagnosis of SARS-CoV-2 by FDA under an Emergency Use Authorization (EUA). This EUA will remain in effect (meaning this test can be used) for the duration of the COVID-19 declaration under Section 564(b)(1) of the Act, 21 U.S.C. section 360bbb-3(b)(1), unless the authorization is terminated or revoked.     Resp Syncytial Virus by PCR NEGATIVE NEGATIVE    Comment: (NOTE) Fact Sheet for Patients: BloggerCourse.com  Fact Sheet for Healthcare Providers: SeriousBroker.it  This test is not yet approved or cleared by the [place] FDA and has been authorized for detection and/or diagnosis of SARS-CoV-2 by FDA under an Emergency Use Authorization (EUA). This EUA will remain in effect (meaning this test can be used) for the duration of the COVID-19 declaration under Section 564(b)(1) of the Act, 21 U.S.C. section 360bbb-3(b)(1), unless the authorization is terminated or revoked.  Performed at [facility], [address], [place], [place] [address]   CBC with Differential/Platelet     Status: Abnormal   Collection Time: 11/26/22  5:41 AM  Result Value Ref Range   WBC 5.9 4.0 - 10.5 K/uL   RBC 3.56 (L) 3.87 - 5.11 MIL/uL   Hemoglobin 9.7 (L) 12.0 - 15.0 g/dL   HCT 60.4 (L) 54.0 - 98.1 %   MCV  93.0 80.0 - 100.0 fL   MCH 27.2 26.0 - 34.0 pg   MCHC 29.3 (L) 30.0 - 36.0 g/dL   RDW 19.1 47.8 - 29.5 %   Platelets 99 (L) 150 - 400 K/uL  Comment: Immature Platelet Fraction may be clinically indicated, consider ordering this additional test [id number] REPEATED TO VERIFY PLATELET COUNT CONFIRMED BY SMEAR    nRBC 0.0 0.0 - 0.2 %   Neutrophils Relative % 77 %   Neutro Abs 4.5 1.7 - 7.7 K/uL   Lymphocytes Relative 12 %   Lymphs Abs 0.7 0.7 - 4.0 K/uL   Monocytes Relative 9 %   Monocytes Absolute 0.5 0.1 - 1.0 K/uL   Eosinophils Relative 2 %   Eosinophils Absolute 0.1 0.0 - 0.5 K/uL   Basophils Relative 0 %   Basophils Absolute 0.0 0.0 - 0.1 K/uL   Immature Granulocytes 0 %   Abs Immature Granulocytes 0.02 0.00 - 0.07 K/uL    Comment: Performed at [facility], [address], [place], [place] [address]  Comprehensive metabolic panel     Status: Abnormal   Collection Time: 11/26/22  5:41 AM  Result Value Ref Range   Sodium 139 135 - 145 mmol/L   Potassium 3.0 (L) 3.5 - 5.1 mmol/L   Chloride 103 98 - 111 mmol/L   CO2 25 22 - 32 mmol/L   Glucose, Bld 102 (H) 70 - 99 mg/dL    Comment: Glucose reference range applies only to samples taken after fasting for at least 8 hours.   BUN 10 6 - 20 mg/dL   Creatinine, Ser 4.74 0.44 - 1.00 mg/dL   Calcium 9.0 8.9 - 25.9 mg/dL   Total Protein 6.5 6.5 - 8.1 g/dL   Albumin 2.9 (L) 3.5 - 5.0 g/dL   AST 12 (L) 15 - 41 U/L   ALT 8 0 - 44 U/L   Alkaline Phosphatase 54 38 - 126 U/L   Total Bilirubin 0.7 0.3 - 1.2 mg/dL   GFR, Estimated >56 >38 mL/min    Comment: (NOTE) Calculated using the CKD-EPI Creatinine Equation (2021)    Anion gap 11 5 - 15    Comment: Performed at [facility], [address] [place], [place] [address]  Magnesium     Status: None   Collection Time: 11/26/22  5:41 AM  Result Value Ref Range   Magnesium 1.8 1.7 - 2.4 mg/dL    Comment: Performed at [facility], [address], [place],  [place] [address]   MR BRAIN WO CONTRAST  Result Date: 11/26/2022 CLINICAL DATA:  Acute neurologic deficit EXAM: MRI HEAD WITHOUT CONTRAST TECHNIQUE: Multiplanar, multiecho pulse sequences of the brain and surrounding structures were obtained without intravenous contrast. COMPARISON:  None Available. FINDINGS: Brain: No acute infarct, mass effect or extra-axial collection. No acute or chronic hemorrhage. 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 Otherwise normal white matter signal, parenchymal volume and CSF spaces. The midline structures are normal. Vascular: Major flow voids are preserved. Skull and upper cervical spine: Normal calvarium and skull base. Visualized upper cervical spine and soft tissues are normal. Sinuses/Orbits:No paranasal sinus fluid levels or advanced mucosal thickening. No mastoid or middle ear effusion. Normal orbits. IMPRESSION: 1. No acute intracranial abnormality. 2. 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 Electronically Signed   By: Deatra Robinson M.D.   On: 11/26/2022 00:53   CT HEAD WO CONTRAST  Result Date: 11/25/2022 CLINICAL DATA:  Sudden onset headache and dizziness. Fatigue for 2 weeks. EXAM: CT HEAD WITHOUT CONTRAST TECHNIQUE: Contiguous axial images were obtained from the base of the skull through the vertex without intravenous contrast. RADIATION DOSE REDUCTION: This exam was performed according to the departmental dose-optimization program which includes automated  exposure control, adjustment of the mA and/or kV according to patient size and/or use of iterative reconstruction technique. COMPARISON:  09/21/2020 FINDINGS: Brain: There is no evidence for acute hemorrhage, hydrocephalus, mass lesion, or abnormal extra-axial fluid collection. No definite CT evidence for acute infarction. Stable small left frontal infarct. Vascular: No hyperdense vessel or unexpected calcification. Skull: No evidence for fracture. No worrisome lytic or sclerotic lesion. Sinuses/Orbits: The visualized  paranasal sinuses and mastoid air cells are clear. Visualized portions of the globes and intraorbital fat are unremarkable. Other: None. IMPRESSION: 1. No acute intracranial abnormality. 2. Stable small left frontal infarct. Electronically Signed   By: Kennith Center M.D.   On: 11/25/2022 17:22    Pending Labs Unresulted Labs (From admission, onward)     Start     Ordered   11/27/22 0500  Basic metabolic panel  Tomorrow morning,   R        11/26/22 0914   11/27/22 0500  CBC  Tomorrow morning,   R        11/26/22 0914            Vitals/Pain Today's Vitals   11/26/22 0325 11/26/22 0327 11/26/22 0555 11/26/22 0735  BP:    120/80  Pulse:    98  Resp:    18  Temp:   97.6 F (36.4 C) 97.9 F (36.6 C)  TempSrc:   Oral Oral  SpO2:    100%  Weight:  126.1 kg    Height:  5\' 3"  (1.6 m)    PainSc: Asleep       Isolation Precautions No active isolations  Medications Medications  ondansetron (ZOFRAN) injection 4 mg (has no administration in time range)  meclizine (ANTIVERT) tablet 25 mg (has no administration in time range)  insulin aspart (novoLOG) injection 0-15 Units (has no administration in time range)  insulin aspart (novoLOG) injection 0-5 Units (has no administration in time range)  sodium chloride flush (NS) 0.9 % injection 3 mL (has no administration in time range)  acetaminophen (TYLENOL) tablet 650 mg (has no administration in time range)    Or  acetaminophen (TYLENOL) suppository 650 mg (has no administration in time range)  docusate sodium (COLACE) capsule 100 mg (has no administration in time range)  polyethylene glycol (MIRALAX / GLYCOLAX) packet 17 g (has no administration in time range)  bisacodyl (DULCOLAX) EC tablet 5 mg (has no administration in time range)  hydrALAZINE (APRESOLINE) injection 5 mg (has no administration in time range)  potassium chloride SA (KLOR-CON M) CR tablet 40 mEq (has no administration in time range)  atorvastatin (LIPITOR) tablet 40 mg  (has no administration in time range)  carvedilol (COREG) tablet 25 mg (has no administration in time range)  mometasone-formoterol (DULERA) 200-5 MCG/ACT inhaler 2 puff (has no administration in time range)  apixaban (ELIQUIS) tablet 5 mg (has no administration in time range)  ferrous sulfate tablet 325 mg (has no administration in time range)  gabapentin (NEURONTIN) capsule 100 mg (has no administration in time range)  hydrOXYzine (ATARAX) tablet 25 mg (has no administration in time range)  methocarbamol (ROBAXIN) tablet 500 mg (has no administration in time range)  PARoxetine (PAXIL) tablet 20 mg (has no administration in time range)  spironolactone (ALDACTONE) tablet 25 mg (has no administration in time range)  sodium chloride 0.9 % bolus 1,000 mL (0 mLs Intravenous Stopped 11/26/22 0302)  meclizine (ANTIVERT) tablet 25 mg (25 mg Oral Given 11/25/22 1928)  diazepam (VALIUM) injection 2.5 mg (2.5  mg Intravenous Given 11/26/22 0114)  oxyCODONE-acetaminophen (PERCOCET/ROXICET) 5-325 MG per tablet 1 tablet (1 tablet Oral Given 11/26/22 0213)  metoCLOPramide (REGLAN) injection 10 mg (10 mg Intravenous Given 11/26/22 0212)    Mobility walks     Focused Assessments Neuro Assessment Handoff:  Swallow screen pass? Yes          Neuro Assessment: Exceptions to WDL (c/o dizziness) Neuro Checks:      Has TPA been given? No If patient is a Neuro Trauma and patient is going to OR before floor call report to 4N Charge nurse: [phone] or [phone]   R Recommendations: See Admitting Provider Note  Report given to:   Additional Notes: alert and oriented, reports dizziness for past 2 weeks.

## 2022-11-26 NOTE — Care Management (Signed)
Transition of Care [facility]) - Inpatient Brief Assessment   Patient Details  Name: [name] MRN: [id number] Date of Birth: 06-08-1970  [facility] CM/SW Contact:    Lockie Pares, RN Phone Number: 11/26/2022, 11:58 AM   Clinical Narrative: Ms Biskner is a 51 yo presented with dizziness and vertigo. This was refractory to medications. Patient will be evaluated by PT. May need HH or OP OT for vestibular.   TOC will follow for needs.   Transition of Care Asessment: Insurance and Status: Insurance coverage has been reviewed Patient has primary care physician: Yes Home environment has been reviewed: yes Prior level of function:: Independent Prior/Current Home Services: No current home services Social Determinants of Health Reivew: SDOH reviewed no interventions necessary Readmission risk has been reviewed: Yes Transition of care needs: transition of care needs identified, TOC will continue to follow

## 2022-11-26 NOTE — Progress Notes (Signed)
PT Cancellation Note  Patient Details Name: [name] MRN: [id number] DOB: 01-23-1971   Cancelled Treatment:    Reason Eval/Treat Not Completed: Patient at procedure or test/unavailable  Patient undergoing nursing care. Will return.   Jerolyn Center, PT Acute Rehabilitation Services  Office [phone]  Zena Amos 11/26/2022, 1:53 PM

## 2022-11-26 NOTE — Evaluation (Signed)
Physical Therapy Evaluation Patient Details Name: [name] MRN: [id number] DOB: [birth year] Today's Date: 11/26/2022  History of Present Illness  52 y.o. female presenting 11/25/22 with vertigo. CT head and MRI brain negative. refractory to additional meclizine, Valium, Reglan, and IV fluids.  PMH significant of HFpEF, COPD, CAD, DM, HTN, morbid obesity, and OSA  Clinical Impression   Pt admitted secondary to problem above with deficits below. PTA patient was living with her mother in a one-level apartment with no steps to enter. She was using a cane vs RW to ambulate.  Pt currently requires minguard assistance to ambulate with RW. All vestibular testing was negative, although some assessments unable to be completed due to strabismus (ex. Head impulse test). No nystagmus noted with any testing. Patient's reports of spinning varied--initially stated several times that it was constant since yesterday morning. Then later stated it lasts ~40 minutes when it occurs and stated it usually happens when she feels her headache worsen. Despite reports of spinning, she was able to get up and walk with RW with minguard assist for safety with no imbalance. Anticipate pt can return home with no follow-up therapy. Will continue to follow acutely to address problems listed below. Will continue to follow acutely to maximize functional mobility independence and safety.           If plan is discharge home, recommend the following:     Can travel by private vehicle        Equipment Recommendations None recommended by PT  Recommendations for Other Services       Functional Status Assessment Patient has had a recent decline in their functional status and demonstrates the ability to make significant improvements in function in a reasonable and predictable amount of time.     Precautions / Restrictions Precautions Precautions: Fall      Mobility  Bed Mobility Overal bed mobility: Needs  Assistance Bed Mobility: Supine to Sit     Supine to sit: Supervision     General bed mobility comments: for safety due to reported constant dizziness    Transfers Overall transfer level: Needs assistance Equipment used: Rolling walker (2 wheels) Transfers: Sit to/from Stand Sit to Stand: Min guard           General transfer comment: for safety due to dizziness; no imbalance    Ambulation/Gait Ambulation/Gait assistance: Min guard Gait Distance (Feet): 30 Feet Assistive device: Rolling walker (2 wheels) Gait Pattern/deviations: Step-through pattern, Decreased stride length Gait velocity: slow Gait velocity interpretation: <1.8 ft/sec, indicate of risk for recurrent falls   General Gait Details: pt without imbalance but twice with "startle response" stating sudden dizziness  Stairs            Wheelchair Mobility     Tilt Bed    Modified Rankin (Stroke Patients Only)       Balance Overall balance assessment: Modified Independent                                           Pertinent Vitals/Pain Pain Assessment Pain Assessment: Faces Faces Pain Scale: Hurts whole lot Pain Location: headache Pain Descriptors / Indicators: Aching, Headache Pain Intervention(s): Limited activity within patient's tolerance, Monitored during session    Home Living Family/patient expects to be discharged to:: Private residence Living Arrangements: Parent Available Help at Discharge: Family;Available 24 hours/day Type of Home: Apartment Home Access: Level  entry       Home Layout: One level Home Equipment: Agricultural consultant (2 wheels);Cane - single point      Prior Function Prior Level of Function : Independent/Modified Independent             Mobility Comments: household ambulation using SPC mostly and RW PRN       Hand Dominance        Extremity/Trunk Assessment   Upper Extremity Assessment Upper Extremity Assessment: Overall WFL for  tasks assessed    Lower Extremity Assessment Lower Extremity Assessment: Overall WFL for tasks assessed    Cervical / Trunk Assessment Cervical / Trunk Assessment: Other exceptions Cervical / Trunk Exceptions: overweight  Communication   Communication: No difficulties  Cognition Arousal/Alertness: Awake/alert Behavior During Therapy: WFL for tasks assessed/performed Overall Cognitive Status: Within Functional Limits for tasks assessed                                 General Comments: although her answers re: dizziness varied throughout session        General Comments      Exercises     Assessment/Plan    PT Assessment Patient needs continued PT services  PT Problem List Decreased activity tolerance       PT Treatment Interventions DME instruction;Gait training;Functional mobility training;Patient/family education    PT Goals (Current goals can be found in the Care Plan section)  Acute Rehab PT Goals Patient Stated Goal: stop feeling dizzy PT Goal Formulation: With patient Time For Goal Achievement: 12/10/22 Potential to Achieve Goals: Good    Frequency Min 1X/week     Co-evaluation               AM-PAC PT "6 Clicks" Mobility  Outcome Measure Help needed turning from your back to your side while in a flat bed without using bedrails?: None Help needed moving from lying on your back to sitting on the side of a flat bed without using bedrails?: A Little Help needed moving to and from a bed to a chair (including a wheelchair)?: A Little Help needed standing up from a chair using your arms (e.g., wheelchair or bedside chair)?: A Little Help needed to walk in hospital room?: A Little Help needed climbing 3-5 steps with a railing? : A Little 6 Click Score: 19    End of Session   Activity Tolerance: Patient tolerated treatment well Patient left: in bed;with call bell/phone within reach;with bed alarm set Nurse Communication: Mobility  status PT Visit Diagnosis: Dizziness and giddiness (R42)    Time: 4259-5638 PT Time Calculation (min) (ACUTE ONLY): 34 min   Charges:   PT Evaluation $PT Eval Moderate Complexity: 1 Mod PT Treatments $Gait Training: 8-22 mins PT General Charges $$ ACUTE PT VISIT: 1 Visit          Jerolyn Center, PT Acute Rehabilitation Services  Office [phone]   Zena Amos 11/26/2022, 3:56 PM

## 2022-11-26 NOTE — H&P (Signed)
History and Physical    Patient: [name] MVH:[id number] DOB: May 17, 1970 DOA: 11/25/2022 DOS: the patient was seen and examined on 11/26/2022 PCP: Marcine Matar, MD  Patient coming from: Home - lives with mother and son (physician); NOK: Mother, Zenola Vroman, [phone]; Brother, [phone]   Chief Complaint: Dizziness  HPI: [name] is a 52 y.o. female with medical history significant of HFpEF, COPD, CAD, DM, HTN, morbid obesity, and OSA presenting with vertigo. She reports 1 day of head spinning, dizziness.  Symptoms are mildly better now.  No recent medication changes or apparent illness.    ER Course:  Carryover, per Dr. Arlean Hopping:  New onset dizziness, sensation of the room spinning, starting at 10 AM on 11/25/2022, prompting her to present to one of the MedCenter's.  +headache.  CT head negative.   Vertigo was refractory to at least 1 dose of meclizine administered at the med center, and she was subsequently transferred ED to ED to [facility] for MRI brain, also negative.   However, her vertigo remains present, refractory to additional meclizine, Valium, Reglan, and IV fluids.     Review of Systems: As mentioned in the history of present illness. All other systems reviewed and are negative. 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   Morbid obesity due to excess calories (HCC)    OSA (obstructive sleep apnea) 07/11/2021   Sleep study April 06, 2019 (Dr. Craige Cotta): Moderate OSA (AHI of 24.4 and SPO2 low of 77%).  Majority events during REM sleep.-recommend CPAP, oral appliance or surgical. Reviewed sleep hygiene.  Avoid sedatives.     Osteoarthritis of left knee    Prediabetes    Small bowel obstruction (HCC)    as a child   Speech impediment    Stutter / stammer   Past Surgical History:  Procedure Laterality Date   ABDOMINAL WALL DEFECT REPAIR  1970   IR CV LINE INJECTION  10/24/2020   IR REMOVAL TUN ACCESS W/ PORT W/O FL MOD SED  05/28/2021   IVC FILTER INSERTION  2017   Lower Extremity Venous Duplex  06/23/2020   No evidence of DVT or superficial thrombosis bilaterally.  No evidence of deep venous insufficiency bilaterally.  No evidence of SSV reflux.  Right GSV in the calf has reflux, no reflux in L GSV.;  Repeated in July 2020-no DVT   OOPHORECTOMY  1996   OOPHORECTOMY  1997   PORTACATH PLACEMENT  2014   TRANSTHORACIC ECHOCARDIOGRAM  05/18/2021   EF 60 to 65%.  No RWMA.  Mild concentric LVH.  "Normal diastolic parameters ".  Normal longitudinal strain.  Normal PAP, RAP.  Normal aortic and mitral valves.==> In July 2022, echo read as GR 1 DD otherwise stable.   Social History:  reports that she quit smoking about 20 years ago. Her smoking use included cigarettes. She started smoking about 38 years ago. She has a 4.5 pack-year smoking history. She has never used smokeless tobacco. She reports that she does not currently use alcohol. She reports that she does not currently use drugs.  Allergies  Allergen Reactions    Ace Inhibitors Rash and Other (See Comments)    Make pt bleed   Aspirin Other (See Comments)    Per patient paperwork: blood clot?  Likely because of chronic DOAC   Hydromorphone Hives and Itching   Vancomycin Itching and Rash   Contrast Media [Iodinated Contrast Media] Hives   Dilaudid [Hydromorphone Hcl] Hives   Lidocaine Itching    Itching with Lidocaine patch reported 06/13/2021 vis telephone message.    Family History  Problem Relation Age of Onset   Diabetes Mellitus II Mother    COPD Father    Diabetes Father    Colon cancer Father    Diabetes Mellitus II Maternal Grandmother    Breast cancer Paternal Grandfather    Liver disease Neg Hx    Esophageal cancer Neg Hx     Prior to Admission medications   Medication Sig Start Date End Date Taking? Authorizing Provider  acetaminophen (TYLENOL) 500 MG tablet Take 1 tablet (500 mg total) by mouth every 6 (six) hours as needed. Patient taking differently: Take 500 mg by mouth every 6 (six) hours as needed for mild pain. 04/19/20   Marcine Matar, MD  acetaminophen (TYLENOL) 650 MG CR tablet Take 650 mg by mouth every 8 (eight) hours as needed for pain.    [provider]  albuterol (PROAIR HFA) 108 (90 Base) MCG/ACT inhaler INHALE TWO PUFFS BY MOUTH EVERY 6 HOURS AS NEEDED FOR WHEEZING OR SHORTNESS OF BREATH 03/11/22   Shahmehdi, Gemma Payor, MD  AMBULATORY NON FORMULARY MEDICATION nitroglycerin 0.125% gel apply a pea size amount to your rectum three times daily x 6-8 weeks. 08/14/22   Jenel Lucks, MD  amLODipine (NORVASC) 5 MG tablet Take 0.5 tablets (2.5 mg total) by mouth daily. 09/05/22   Marcine Matar, MD  atorvastatin (LIPITOR) 40 MG tablet Take 1 tablet (40 mg total) by mouth daily. 11/22/21   Marcine Matar, MD  busPIRone (BUSPAR) 7.5 MG tablet Take 1 tablet by mouth 2 (two) times daily.    [provider]  Carboxymethylcellulose Sodium (EYE DROPS OP) Place 1 drop into both eyes daily as needed  (dry eyes).    [provider]  carvedilol (COREG) 25 MG tablet TAKE 1 TABLET BY MOUTH TWICE A DAY WITH A MEAL 06/07/22   Marcine Matar, MD  cholecalciferol (VITAMIN D3) 25 MCG (1000 UNIT) tablet Take 1 tablet (1,000 Units total) by mouth daily. 04/07/20   Marcine Matar, MD  diclofenac Sodium (VOLTAREN) 1 % GEL Apply 2 g topically 4 (four) times daily. 05/07/22   Marcine Matar, MD  DULERA 200-5 MCG/ACT AERO INHALE 2 PUFFS EVERY MORNING AND 2 PUFFS EVERY NIGHT AT BEDTIME 06/04/22   Marcine Matar, MD  ELIQUIS 5 MG TABS tablet TAKE 1 TABLET BY MOUTH TWICE A DAY 07/02/22   Marcine Matar, MD  FARXIGA 10 MG TABS tablet TAKE 1 TABLET BY  MOUTH DAILY 02/11/22   Marcine Matar, MD  FEROSUL 325 (65 Fe) MG tablet TAKE ONE TABLET BY MOUTH DAILY WITH BREAKFAST 06/06/21   Marcine Matar, MD  furosemide (LASIX) 80 MG tablet Take 1 tablet (80 mg total) by mouth daily. 09/05/22   Marcine Matar, MD  gabapentin (NEURONTIN) 100 MG capsule TAKE ONE CAPSULE BY MOUTH THREE TIMES A DAY 09/04/22   Marcine Matar, MD  hydrocortisone cream 1 % Apply 1 application topically daily as needed for itching.    [provider]  hydrOXYzine (VISTARIL) 25 MG capsule Take 1 capsule (25 mg total) by mouth at bedtime as needed. Patient taking differently: Take 25 mg by mouth at bedtime as needed for anxiety or itching. 04/20/21   Marcine Matar, MD  methocarbamol (ROBAXIN) 500 MG tablet TAKE 1 TABLET BY MOUTH TWICE A DAY AS NEEDED FOR MUSCLE SPASMS 11/15/22   Marcine Matar, MD  methylPREDNISolone (MEDROL DOSEPAK) 4 MG TBPK tablet Medrol Dosepak take as instructed 03/11/22   Kendell Bane, MD  neomycin-bacitracin-polymyxin [phone] OINT Apply 1 Application topically 2 (two) times daily. 03/11/22   Shahmehdi, Gemma Payor, MD  nitroGLYCERIN (NITROSTAT) 0.4 MG SL tablet Place 1 tablet (0.4 mg total) under the tongue every 5 (five) minutes as needed for chest pain. 11/22/21   Marcine Matar, MD  PARoxetine (PAXIL) 20 MG tablet TAKE 1 TABLET BY MOUTH DAILY 06/04/22   Marcine Matar, MD  polyethylene glycol powder (GLYCOLAX/MIRALAX) 17 GM/SCOOP powder Take 17 g by mouth daily as needed for mild constipation. 05/07/22   Marcine Matar, MD  potassium chloride SA (KLOR-CON M20) 20 MEQ tablet Take 1 tablet (20 mEq total) by mouth daily. 11/22/21   Marcine Matar, MD  Semaglutide, 1 MG/DOSE, 4 MG/3ML SOPN Inject 1 mg as directed once a week. 11/22/22   Marcine Matar, MD  spironolactone (ALDACTONE) 25 MG tablet TAKE 1 TABLET BY MOUTH DAILY 06/07/22   Marcine Matar, MD  vitamin B-12 (CYANOCOBALAMIN) 1000 MCG tablet Take 1,000 mcg by mouth daily.    [provider]    Physical Exam: Vitals:   11/26/22 0044 11/26/22 0327 11/26/22 0555 11/26/22 0735  BP: 121/86   120/80  Pulse: (!) 104   98  Resp: 18   18  Temp: 97.6 F (36.4 C)  97.6 F (36.4 C) 97.9 F (36.6 C)  TempSrc: Oral  Oral Oral  SpO2: 100%   100%  Weight:  126.1 kg    Height:  5\' 3"  (1.6 m)     General:  Appears calm and comfortable and is in NAD Eyes:  alternating exotropia, normal lids, iris ENT:  grossly normal hearing, lips & tongue, mmm Neck:  no LAD, masses or thyromegaly Cardiovascular:  RRR, no m/r/g. Chronic LE lymphedema.  Respiratory:   CTA bilaterally with no wheezes/rales/rhonchi.  Normal respiratory effort. Abdomen:  soft, NT, ND Skin:  no rash or induration seen on limited exam Musculoskeletal:  grossly normal tone BUE/BLE, good ROM, no bony abnormality Psychiatric:  grossly normal mood and affect, speech fluent and appropriate, AOx3 Neurologic:  CN 2-12 grossly intact, moves all extremities in coordinated fashion   Radiological Exams on Admission: Independently reviewed - see discussion in A/P where applicable  MR BRAIN WO CONTRAST  Result Date: 11/26/2022 CLINICAL DATA:  Acute neurologic deficit EXAM: MRI HEAD WITHOUT CONTRAST TECHNIQUE: Multiplanar, multiecho  pulse sequences of the brain and surrounding structures were obtained without intravenous contrast.  COMPARISON:  None Available. FINDINGS: Brain: No acute infarct, mass effect or extra-axial collection. No acute or chronic hemorrhage. 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 Otherwise normal white matter signal, parenchymal volume and CSF spaces. The midline structures are normal. Vascular: Major flow voids are preserved. Skull and upper cervical spine: Normal calvarium and skull base. Visualized upper cervical spine and soft tissues are normal. Sinuses/Orbits:No paranasal sinus fluid levels or advanced mucosal thickening. No mastoid or middle ear effusion. Normal orbits. IMPRESSION: 1. No acute intracranial abnormality. 2. 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 Electronically Signed   By: Deatra Robinson M.D.   On: 11/26/2022 00:53   CT HEAD WO CONTRAST  Result Date: 11/25/2022 CLINICAL DATA:  Sudden onset headache and dizziness. Fatigue for 2 weeks. EXAM: CT HEAD WITHOUT CONTRAST TECHNIQUE: Contiguous axial images were obtained from the base of the skull through the vertex without intravenous contrast. RADIATION DOSE REDUCTION: This exam was performed according to the departmental dose-optimization program which includes automated exposure control, adjustment of the mA and/or kV according to patient size and/or use of iterative reconstruction technique. COMPARISON:  09/21/2020 FINDINGS: Brain: There is no evidence for acute hemorrhage, hydrocephalus, mass lesion, or abnormal extra-axial fluid collection. No definite CT evidence for acute infarction. Stable small left frontal infarct. Vascular: No hyperdense vessel or unexpected calcification. Skull: No evidence for fracture. No worrisome lytic or sclerotic lesion. Sinuses/Orbits: The visualized paranasal sinuses and mastoid air cells are clear. Visualized portions of the globes and intraorbital fat are unremarkable. Other: None. IMPRESSION: 1. No acute intracranial  abnormality. 2. Stable small left frontal infarct. Electronically Signed   By: Kennith Center M.D.   On: 11/25/2022 17:22    EKG: Independently reviewed.  NSR with rate 98; nonspecific ST changes with no evidence of acute ischemia   Labs on Admission: I have personally reviewed the available labs and imaging studies at the time of the admission.  Pertinent labs:    K+ 3.0 Albumin 2.9 WBC 5.9 Hgb 9.7, 11.3 on 7/29, 12.4 on 4/17 Platelets 99, 117 on 7/29 COVID/flu/RSV negative   Assessment and Plan: Principal Problem:   Vertigo Active Problems:   Hyperlipidemia with target LDL less than 100   Chronic anemia   Essential hypertension   (HFpEF) heart failure with preserved ejection fraction (HCC) -> although echo suggests normal diastolic parameters with normal left atrial size   CAD (coronary artery disease)   History of DVT (deep vein thrombosis)   Depression   OSA (obstructive sleep apnea)   Type 2 diabetes mellitus with morbid obesity (HCC)    Vertigo -Patient presenting with vertigo -MRI was negative, ruling out CVA as cause for her symptoms -She was given Valium, meclizine, and Reglan in the ER with improvement in symptoms -Will observe on telemetry overnight -Vestibular PT consult -Continue meclizine prn -Anticipate d/c to home tomorrow   Anemia/thrombocytopenia -Periodic anemia, currently worse than usual -Normocytic in nature currently -Followed by Dr. Candise Che for iron deficiency anemia, last seen on 10/07/22 -Continue FeSO4 -Also with chronic thrombocytopenia -Recheck CBC in AM  Hypokalemia -Will replete, as this may be a contributor to symptoms -Recheck BMP in AM  HFpEF/CAD -04/2021 echo with normal EF and normal diastolic parameters -Appears compensated at this time -Hold Farxiga, Lasix -Continue spironolactone  COPD -Continue Dulera -No longer smoking  Chronic pain -Continue gabapentin, methocarbamol  DM -Recent A1c was 5.5 -hold  Glucophage -Cover with moderate-scale SSI   Chronic DVT -Complicated by chronic LE lymphedema -s/p IVC filter -Continue apixiban  HTN -Continue carvedilol -Hold amlodipine, takes prn  HLD -Continue atorvastatin  Mood d/o -Continue paroxetine, hydralazine  Morbid obesity -Body mass index is 49.25 kg/m.. -Has lost significant weight on Ozempic (holding while inpatient) -Weight loss should be encouraged on an ongoing basis -Outpatient PCP/bariatric medicine/bariatric surgery f/u encouraged   OSA -Will order CPAP    Advance Care Planning:   Code Status: Full Code - Code status was discussed with the patient and at the time of admission.  The patient would want to receive full resuscitative measures at this time.   Consults: TOC team, nutrition  DVT Prophylaxis: Eliquis  Family Communication: None present; I attempted to reach her brother and left a message at the time of admission  Severity of Illness: The appropriate patient status for this patient is OBSERVATION. Observation status is judged to be reasonable and necessary in order to provide the required intensity of service to ensure the patient's safety. The patient's presenting symptoms, physical exam findings, and initial radiographic and laboratory data in the context of their medical condition is felt to place them at decreased risk for further clinical deterioration. Furthermore, it is anticipated that the patient will be medically stable for discharge from the hospital within 2 midnights of admission.   Author: [name], MD 11/26/2022 11:28 AM  For on call review [email or web address].

## 2022-11-26 NOTE — Progress Notes (Signed)
  Carryover admission to the Day Admitter.  I discussed this case with the EDP, Dr. Blinda Leatherwood.  Per these discussions:   This is a 52 year old female who is being admitted with vertigo.   She reports new onset dizziness, sensation of the room spinning, starting at 10 AM on 11/25/2022, prompting her to present to one of the MedCenter's.  This was associated with headache.  CT head at the med center showed no evidence of acute process.  Vertigo was refractory to at least 1 dose of meclizine administered at the med center, and she was subsequently transferred ED to ED to [facility] for pursuit of MRI brain, which is subsequently demonstrated no evidence of acute process, including no evidence of acute infarct.  However, her vertigo remains present, refractory to additional meclizine, Valium, Reglan, and IV fluids.  I have placed an order for observation for further evaluation management of presenting vertigo.  I have placed some additional preliminary admit orders via the adult multi-morbid admission order set. I have also ordered as needed meclizine as well as order for vestibular physical therapy.  Of also ordered morning labs in the form of CMP, CBC, and serum magnesium level.  Also ordered fall precautions.    Newton Pigg, DO Hospitalist

## 2022-11-26 NOTE — Plan of Care (Signed)

## 2022-11-26 NOTE — ED Provider Notes (Signed)
Patient transferred for MRI.  Patient with severe dizziness, trouble with ambulation secondary to vertigo type symptoms.  Initial workup at med center was negative.  Patient arrives in the ED still feeling severely dizzy.  She had received meclizine prior to arrival, was still having dizziness.  Patient also complaining of headache.  She is on an anticoagulant, cannot receive Toradol.  Given Percocet, Reglan as well as IV hydration and Valium to help with vertigo symptoms.  MRI without acute changes.  Upon recheck, patient still having some headache and fairly significant dizziness despite treatment.  Suspect peripheral nature of her vertigo but patient unable to get up and ambulate, will require hospitalization.   Gilda Crease, MD 11/26/22 [phone]

## 2022-11-27 ENCOUNTER — Telehealth: Payer: Self-pay | Admitting: Internal Medicine

## 2022-11-27 ENCOUNTER — Observation Stay (HOSPITAL_COMMUNITY): Payer: Commercial Managed Care - HMO

## 2022-11-27 DIAGNOSIS — I2510 Atherosclerotic heart disease of native coronary artery without angina pectoris: Secondary | ICD-10-CM | POA: Diagnosis not present

## 2022-11-27 DIAGNOSIS — R55 Syncope and collapse: Secondary | ICD-10-CM | POA: Diagnosis not present

## 2022-11-27 DIAGNOSIS — Z87891 Personal history of nicotine dependence: Secondary | ICD-10-CM | POA: Diagnosis not present

## 2022-11-27 DIAGNOSIS — E119 Type 2 diabetes mellitus without complications: Secondary | ICD-10-CM | POA: Diagnosis not present

## 2022-11-27 DIAGNOSIS — Z95828 Presence of other vascular implants and grafts: Secondary | ICD-10-CM | POA: Diagnosis not present

## 2022-11-27 DIAGNOSIS — E876 Hypokalemia: Secondary | ICD-10-CM | POA: Diagnosis not present

## 2022-11-27 DIAGNOSIS — E785 Hyperlipidemia, unspecified: Secondary | ICD-10-CM | POA: Diagnosis not present

## 2022-11-27 DIAGNOSIS — R42 Dizziness and giddiness: Secondary | ICD-10-CM | POA: Diagnosis not present

## 2022-11-27 DIAGNOSIS — Z881 Allergy status to other antibiotic agents status: Secondary | ICD-10-CM | POA: Diagnosis not present

## 2022-11-27 DIAGNOSIS — Z6841 Body Mass Index (BMI) 40.0 and over, adult: Secondary | ICD-10-CM | POA: Diagnosis not present

## 2022-11-27 DIAGNOSIS — Z1152 Encounter for screening for COVID-19: Secondary | ICD-10-CM | POA: Diagnosis not present

## 2022-11-27 DIAGNOSIS — D696 Thrombocytopenia, unspecified: Secondary | ICD-10-CM | POA: Diagnosis not present

## 2022-11-27 DIAGNOSIS — Z86718 Personal history of other venous thrombosis and embolism: Secondary | ICD-10-CM | POA: Diagnosis not present

## 2022-11-27 DIAGNOSIS — Z885 Allergy status to narcotic agent status: Secondary | ICD-10-CM | POA: Diagnosis not present

## 2022-11-27 DIAGNOSIS — I5032 Chronic diastolic (congestive) heart failure: Secondary | ICD-10-CM | POA: Diagnosis not present

## 2022-11-27 DIAGNOSIS — J449 Chronic obstructive pulmonary disease, unspecified: Secondary | ICD-10-CM | POA: Diagnosis not present

## 2022-11-27 DIAGNOSIS — Z886 Allergy status to analgesic agent status: Secondary | ICD-10-CM | POA: Diagnosis not present

## 2022-11-27 DIAGNOSIS — G4733 Obstructive sleep apnea (adult) (pediatric): Secondary | ICD-10-CM | POA: Diagnosis not present

## 2022-11-27 DIAGNOSIS — Z888 Allergy status to other drugs, medicaments and biological substances status: Secondary | ICD-10-CM | POA: Diagnosis not present

## 2022-11-27 DIAGNOSIS — G8929 Other chronic pain: Secondary | ICD-10-CM | POA: Diagnosis not present

## 2022-11-27 DIAGNOSIS — I110 Hypertensive heart disease with heart failure: Secondary | ICD-10-CM | POA: Diagnosis not present

## 2022-11-27 DIAGNOSIS — F32A Depression, unspecified: Secondary | ICD-10-CM | POA: Diagnosis not present

## 2022-11-27 DIAGNOSIS — Z91041 Radiographic dye allergy status: Secondary | ICD-10-CM | POA: Diagnosis not present

## 2022-11-27 DIAGNOSIS — D509 Iron deficiency anemia, unspecified: Secondary | ICD-10-CM | POA: Diagnosis not present

## 2022-11-27 LAB — GLUCOSE, CAPILLARY
Glucose-Capillary: 148 mg/dL — ABNORMAL HIGH (ref 70–99)
Glucose-Capillary: 136 mg/dL — ABNORMAL HIGH (ref 70–99)
Glucose-Capillary: 78 mg/dL (ref 70–99)
Glucose-Capillary: 102 mg/dL — ABNORMAL HIGH (ref 70–99)
Glucose-Capillary: 104 mg/dL — ABNORMAL HIGH (ref 70–99)
Glucose-Capillary: 113 mg/dL — ABNORMAL HIGH (ref 70–99)

## 2022-11-27 LAB — ECHOCARDIOGRAM COMPLETE
Height: 63 in
S' Lateral: 3.4 cm
Weight: 4448 oz

## 2022-11-27 LAB — TSH: TSH: 0.834 u[IU]/mL (ref 0.350–4.500)

## 2022-11-27 LAB — CORTISOL: Cortisol, Plasma: 4.6 ug/dL

## 2022-11-27 MED ORDER — GUAIFENESIN 100 MG/5ML PO LIQD: 5.0000 mL | ORAL | Status: DC | PRN

## 2022-11-27 MED ORDER — TRAZODONE HCL 50 MG PO TABS
50.0000 mg | ORAL_TABLET | Freq: Every evening | ORAL | Status: DC | PRN
  Administered 2022-11-29: 50 mg via ORAL
  Filled 2022-11-27: qty 1

## 2022-11-27 MED ORDER — POTASSIUM CHLORIDE CRYS ER 20 MEQ PO TBCR
40.0000 meq | EXTENDED_RELEASE_TABLET | Freq: Once | ORAL | Status: AC
  Administered 2022-11-27: 40 meq via ORAL
  Filled 2022-11-27: qty 2

## 2022-11-27 MED ORDER — METOPROLOL TARTRATE 5 MG/5ML IV SOLN: 5.0000 mg | INTRAVENOUS | Status: DC | PRN

## 2022-11-27 MED ORDER — PERFLUTREN LIPID MICROSPHERE
1.0000 mL | INTRAVENOUS | Status: AC | PRN
  Administered 2022-11-27: 5 mL via INTRAVENOUS

## 2022-11-27 MED ORDER — HYDRALAZINE HCL 20 MG/ML IJ SOLN: 10.0000 mg | INTRAMUSCULAR | Status: DC | PRN

## 2022-11-27 MED ORDER — SENNOSIDES-DOCUSATE SODIUM 8.6-50 MG PO TABS: 1.0000 | ORAL_TABLET | Freq: Every evening | ORAL | Status: DC | PRN

## 2022-11-27 MED ORDER — ONDANSETRON HCL 4 MG/2ML IJ SOLN: 4.0000 mg | Freq: Four times a day (QID) | INTRAMUSCULAR | Status: DC | PRN

## 2022-11-27 MED ORDER — SODIUM CHLORIDE 0.9 % IV SOLN: INTRAVENOUS | Status: AC

## 2022-11-27 MED ORDER — SODIUM CHLORIDE 0.9 % IV BOLUS
1000.0000 mL | Freq: Once | INTRAVENOUS | Status: AC
  Administered 2022-11-27: 1000 mL via INTRAVENOUS

## 2022-11-27 MED ORDER — IPRATROPIUM-ALBUTEROL 0.5-2.5 (3) MG/3ML IN SOLN: 3.0000 mL | RESPIRATORY_TRACT | Status: DC | PRN

## 2022-11-27 NOTE — TOC Progression Note (Signed)
Transition of Care [facility]) - Progression Note    Patient Details  Name: [name] MRN: [id number] Date of Birth: 05/26/1970  Transition of Care [facility]) CM/SW Contact  Janae Bridgeman, RN Phone Number: 11/27/2022, 10:41 AM  Clinical Narrative:    No TOC needs before returning to home.  PT notes noted and patient does not need additional DME nor PT follow up regarding mobilization.  Patient should return home with family once she is medically stable to discharge.        Expected Discharge Plan and Services                                               Social Determinants of Health (SDOH) Interventions SDOH Screenings   Food Insecurity: No Food Insecurity (11/26/2022)  Housing: Low Risk  (11/26/2022)  Transportation Needs: No Transportation Needs (11/26/2022)  Utilities: Not At Risk (11/26/2022)  Alcohol Screen: Low Risk  (09/02/2022)  Depression (PHQ2-9): Low Risk  (09/05/2022)  Financial Resource Strain: Low Risk  (09/02/2022)  Physical Activity: Insufficiently Active (09/02/2022)  Social Connections: Moderately Isolated (09/02/2022)  Stress: No Stress Concern Present (09/02/2022)  Tobacco Use: Medium Risk (11/25/2022)    Readmission Risk Interventions     No data to display

## 2022-11-27 NOTE — Plan of Care (Signed)

## 2022-11-27 NOTE — Progress Notes (Signed)
  Echocardiogram 2D Echocardiogram has been performed.  Leda Roys RDCS 11/27/2022, 2:08 PM

## 2022-11-27 NOTE — Progress Notes (Addendum)
PROGRESS NOTE    [name]  WUJ:[id number] DOB: [birth year] DOA: 11/25/2022 PCP: Marcine Matar, MD   Brief Narrative:   HPI: [name] is a 52 y.o. female with medical history significant of HFpEF, COPD, CAD, DM, HTN, morbid obesity, and OSA presenting with vertigo. She reports 1 day of head spinning, dizziness.  Upon admission CT of the head and MRI brain were negative.  Routine metabolic workup overall was negative as well.   Assessment & Plan:  Principal Problem:   Vertigo Active Problems:   Hyperlipidemia with target LDL less than 100   Chronic anemia   Essential hypertension   (HFpEF) heart failure with preserved ejection fraction (HCC) -> although echo suggests normal diastolic parameters with normal left atrial size   CAD (coronary artery disease)   History of DVT (deep vein thrombosis)   Depression   OSA (obstructive sleep apnea)   Type 2 diabetes mellitus with morbid obesity (HCC)     Vertigo -Workup for stroke has been negative for acute stroke including CT head and MRI brain.  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  TSH, random cortisol negative.  Advised nursing staff to obtain orthostatics and document in the computer.  Will give IV fluids.  1 episode of soft blood pressure in the morning therefore normal saline bolus ordered.  Family confirms that patient does have slight facial asymmetry especially on the left side of the face including forehead.  Also mother confirms that she at times stutters with her speech.  All of these issues have been chronic for her.   Anemia/thrombocytopenia Follows outpatient hematology.  Platelets stable   Hypokalemia Replete as needed   HFpEF/CAD -04/2021 echo with normal EF and normal diastolic parameters Will hold diuretics.  Repeat echocardiogram.   COPD As needed bronchodilators   Chronic pain -Continue gabapentin, methocarbamol   DM A1c 5.5, metformin on hold.  Currently on sliding  scale and Accu-Cheks   Chronic DVT Status post DC filter.  Continue Eliquis   HTN Continue Coreg.  IV as needed.   HLD -Continue atorvastatin   Mood d/o -Continue paroxetine, hydralazine   Morbid obesity -Body mass index is 49.25 kg/m.. -Has lost significant weight on Ozempic (holding while inpatient) -Weight loss should be encouraged on an ongoing basis -Outpatient PCP/bariatric medicine/bariatric surgery f/u encouraged    OSA CPAP      DVT prophylaxis: Eliquis Code Status: Full code Family Communication:   Still quite dizzy and soft blood pressure this morning.  IV bolus ordered       Diet Orders (From admission, onward)     Start     Ordered   11/26/22 0910  Diet heart healthy/carb modified Room service appropriate? Yes; Fluid consistency: Thin  Diet effective now       Question Answer Comment  Diet-HS Snack? Nothing   Room service appropriate? Yes   Fluid consistency: Thin      11/26/22 0914            Subjective: Seen at bed side.  Denies any new complaints or recent URI.  At her request I spoke with patient's mom who confirms that patient does have chronic left-sided facial asymmetry and slight stuttering of her speech.  Blood pressure soft during my visit   Examination:  General exam: Appears calm and comfortable  Respiratory system: Clear to auscultation. Respiratory effort normal. Cardiovascular system: S1 & S2 heard, RRR. No JVD, murmurs, rubs, gallops or clicks. No pedal edema. Gastrointestinal system:  Abdomen is nondistended, soft and nontender. No organomegaly or masses felt. Normal bowel sounds heard. Central nervous system: Alert and oriented.  Some left facial asymmetry Extremities: Symmetric 5 x 5 power. Skin: No rashes, lesions or ulcers Psychiatry: Judgement and insight appear normal. Mood & affect appropriate.  Objective: Vitals:   11/27/22 0758 11/27/22 0843 11/27/22 0848 11/27/22 0858  BP:  104/75 101/76 100/73  Pulse: 96  95 85 88  Resp: 18     Temp:      TempSrc:      SpO2: 97% 100% 99% 99%  Weight:      Height:        Intake/Output Summary (Last 24 hours) at 11/27/2022 1242 Last data filed at 11/27/2022 1211 Gross per 24 hour  Intake 590 ml  Output 100 ml  Net 490 ml   Filed Weights   11/26/22 0327  Weight: 126.1 kg    Scheduled Meds:  apixaban  5 mg Oral BID   atorvastatin  40 mg Oral Daily   carvedilol  25 mg Oral BID WC   docusate sodium  100 mg Oral BID   ferrous sulfate  325 mg Oral Q breakfast   gabapentin  100 mg Oral TID   insulin aspart  0-15 Units Subcutaneous TID WC   insulin aspart  0-5 Units Subcutaneous QHS   mometasone-formoterol  2 puff Inhalation BID   PARoxetine  20 mg Oral Daily   sodium chloride flush  3 mL Intravenous Q12H   Continuous Infusions:  sodium chloride 50 mL/hr at 11/27/22 1011    Nutritional status     Body mass index is 49.25 kg/m.  Data Reviewed:   CBC: Recent Labs  Lab 11/25/22 1524 11/26/22 0541 11/27/22 0006  WBC 5.6 5.9 4.5  NEUTROABS  --  4.5  --   HGB 11.3* 9.7* 9.9*  HCT 36.9 33.1* 33.1*  MCV 90.7 93.0 93.0  PLT 117* 99* 111*   Basic Metabolic Panel: Recent Labs  Lab 11/25/22 1524 11/26/22 0541 11/27/22 0006  NA 139 139 136  K 3.5 3.0* 3.5  CL 102 103 103  CO2 28 25 25   GLUCOSE 94 102* 116*  BUN 11 10 13   CREATININE 0.77 0.84 0.94  CALCIUM 9.9 9.0 8.7*  MG  --  1.8  --    GFR: Estimated Creatinine Clearance: 88.5 mL/min (by C-G formula based on SCr of 0.94 mg/dL). Liver Function Tests: Recent Labs  Lab 11/25/22 1524 11/26/22 0541  AST 10* 12*  ALT <5 8  ALKPHOS 65 54  BILITOT 0.9 0.7  PROT 7.9 6.5  ALBUMIN 4.1 2.9*   No results for input(s): "LIPASE", "AMYLASE" in the last 168 hours. No results for input(s): "AMMONIA" in the last 168 hours. Coagulation Profile: No results for input(s): "INR", "PROTIME" in the last 168 hours. Cardiac Enzymes: No results for input(s): "CKTOTAL", "CKMB", "CKMBINDEX",  "TROPONINI" in the last 168 hours. BNP (last 3 results) No results for input(s): "PROBNP" in the last 8760 hours. HbA1C: No results for input(s): "HGBA1C" in the last 72 hours. CBG: Recent Labs  Lab 11/26/22 1643 11/26/22 2039 11/27/22 0739 11/27/22 1209  GLUCAP 88 113* 148* 136*   Lipid Profile: No results for input(s): "CHOL", "HDL", "LDLCALC", "TRIG", "CHOLHDL", "LDLDIRECT" in the last 72 hours. Thyroid Function Tests: Recent Labs    11/27/22 0854  TSH 0.834   Anemia Panel: No results for input(s): "VITAMINB12", "FOLATE", "FERRITIN", "TIBC", "IRON", "RETICCTPCT" in the last 72 hours. Sepsis Labs: No results  for input(s): "PROCALCITON", "LATICACIDVEN" in the last 168 hours.  Recent Results (from the past 240 hour(s))  Resp panel by RT-PCR (RSV, Flu A&B, Covid) Anterior Nasal Swab     Status: None   Collection Time: 11/25/22  7:29 PM   Specimen: Anterior Nasal Swab  Result Value Ref Range Status   SARS Coronavirus 2 by RT PCR NEGATIVE NEGATIVE Final    Comment: (NOTE) SARS-CoV-2 target nucleic acids are NOT DETECTED.  The SARS-CoV-2 RNA is generally detectable in upper respiratory specimens during the acute phase of infection. The lowest concentration of SARS-CoV-2 viral copies this assay can detect is 138 copies/mL. A negative result does not preclude SARS-Cov-2 infection and should not be used as the sole basis for treatment or other patient management decisions. A negative result may occur with  improper specimen collection/handling, submission of specimen other than nasopharyngeal swab, presence of viral mutation(s) within the areas targeted by this assay, and inadequate number of viral copies(<138 copies/mL). A negative result must be combined with clinical observations, patient history, and epidemiological information. The expected result is Negative.  Fact Sheet for Patients:  BloggerCourse.com  Fact Sheet for Healthcare Providers:   SeriousBroker.it  This test is no t yet approved or cleared by the [place] FDA and  has been authorized for detection and/or diagnosis of SARS-CoV-2 by FDA under an Emergency Use Authorization (EUA). This EUA will remain  in effect (meaning this test can be used) for the duration of the COVID-19 declaration under Section 564(b)(1) of the Act, 21 U.S.C.section 360bbb-3(b)(1), unless the authorization is terminated  or revoked sooner.       Influenza A by PCR NEGATIVE NEGATIVE Final   Influenza B by PCR NEGATIVE NEGATIVE Final    Comment: (NOTE) The Xpert Xpress SARS-CoV-2/FLU/RSV plus assay is intended as an aid in the diagnosis of influenza from Nasopharyngeal swab specimens and should not be used as a sole basis for treatment. Nasal washings and aspirates are unacceptable for Xpert Xpress SARS-CoV-2/FLU/RSV testing.  Fact Sheet for Patients: BloggerCourse.com  Fact Sheet for Healthcare Providers: SeriousBroker.it  This test is not yet approved or cleared by the [place] FDA and has been authorized for detection and/or diagnosis of SARS-CoV-2 by FDA under an Emergency Use Authorization (EUA). This EUA will remain in effect (meaning this test can be used) for the duration of the COVID-19 declaration under Section 564(b)(1) of the Act, 21 U.S.C. section 360bbb-3(b)(1), unless the authorization is terminated or revoked.     Resp Syncytial Virus by PCR NEGATIVE NEGATIVE Final    Comment: (NOTE) Fact Sheet for Patients: BloggerCourse.com  Fact Sheet for Healthcare Providers: SeriousBroker.it  This test is not yet approved or cleared by the [place] FDA and has been authorized for detection and/or diagnosis of SARS-CoV-2 by FDA under an Emergency Use Authorization (EUA). This EUA will remain in effect (meaning this test can be used) for  the duration of the COVID-19 declaration under Section 564(b)(1) of the Act, 21 U.S.C. section 360bbb-3(b)(1), unless the authorization is terminated or revoked.  Performed at [facility], [name] [place], [place] [address]          Radiology Studies: MR BRAIN WO CONTRAST  Result Date: 11/26/2022 CLINICAL DATA:  Acute neurologic deficit EXAM: MRI HEAD WITHOUT CONTRAST TECHNIQUE: Multiplanar, multiecho pulse sequences of the brain and surrounding structures were obtained without intravenous contrast. COMPARISON:  None Available. FINDINGS: Brain: No acute infarct, mass effect or extra-axial collection. No acute or chronic  hemorrhage. 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 Otherwise normal white matter signal, parenchymal volume and CSF spaces. The midline structures are normal. Vascular: Major flow voids are preserved. Skull and upper cervical spine: Normal calvarium and skull base. Visualized upper cervical spine and soft tissues are normal. Sinuses/Orbits:No paranasal sinus fluid levels or advanced mucosal thickening. No mastoid or middle ear effusion. Normal orbits. IMPRESSION: 1. No acute intracranial abnormality. 2. 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 Electronically Signed   By: Deatra Robinson M.D.   On: 11/26/2022 00:53   CT HEAD WO CONTRAST  Result Date: 11/25/2022 CLINICAL DATA:  Sudden onset headache and dizziness. Fatigue for 2 weeks. EXAM: CT HEAD WITHOUT CONTRAST TECHNIQUE: Contiguous axial images were obtained from the base of the skull through the vertex without intravenous contrast. RADIATION DOSE REDUCTION: This exam was performed according to the departmental dose-optimization program which includes automated exposure control, adjustment of the mA and/or kV according to patient size and/or use of iterative reconstruction technique. COMPARISON:  09/21/2020 FINDINGS: Brain: There is no evidence for acute hemorrhage, hydrocephalus, mass lesion, or  abnormal extra-axial fluid collection. No definite CT evidence for acute infarction. Stable small left frontal infarct. Vascular: No hyperdense vessel or unexpected calcification. Skull: No evidence for fracture. No worrisome lytic or sclerotic lesion. Sinuses/Orbits: The visualized paranasal sinuses and mastoid air cells are clear. Visualized portions of the globes and intraorbital fat are unremarkable. Other: None. IMPRESSION: 1. No acute intracranial abnormality. 2. Stable small left frontal infarct. Electronically Signed   By: Kennith Center M.D.   On: 11/25/2022 17:22           LOS: 0 days   Time spent= 35 mins    Vermon Grays Joline Maxcy, MD [facility]  If 7PM-7AM, please contact night-coverage  11/27/2022, 12:42 PM

## 2022-11-27 NOTE — Telephone Encounter (Signed)
Copied from CRM [id number]. Topic: General - Other >> Nov 27, 2022  8:30 AM Everette C wrote: Reason for CRM: The patient has called to share that their hemoglobin was 9.9   Please contact the patient further if needed

## 2022-11-27 NOTE — Progress Notes (Signed)
Initial Nutrition Assessment  DOCUMENTATION CODES:      INTERVENTION:  - Continue HH CHO mod diet.   NUTRITION DIAGNOSIS:  No nutrition diagnosis at this time.   REASON FOR ASSESSMENT:   Consult Assessment of nutrition requirement/status  ASSESSMENT:   52 y.o. female admits related to dizziness. PMH includes: HFpEF, arthritis, cellulitis, COPD, CAD, DM, DVT, HTN, OSA. Pt is currently receiving medical management related to Vertigo.  Meds reviewed:  lipitor, colace, ferrous sulfate, sliding scale insulin. Labs reviewed: WDL.   Pt was out of room at time of assessment. Per record, pt has eaten 100% of her meals today. No significant wt loss per record. Pt is likely meeting her needs at this time. Blood sugars are well controlled. No nutrition interventions at this time. RD will sign off, if POC changes please re-consult.   Diet Order:   Diet Order             Diet heart healthy/carb modified Room service appropriate? Yes; Fluid consistency: Thin  Diet effective now                   EDUCATION NEEDS:      Skin:  Skin Assessment: Reviewed RN Assessment  Last BM:  7/30  Height:   Ht Readings from Last 1 Encounters:  11/26/22 5\' 3"  (1.6 m)    Weight:   Wt Readings from Last 1 Encounters:  11/26/22 126.1 kg    Ideal Body Weight:     BMI:  Body mass index is 49.25 kg/m.  Estimated Nutritional Needs:   Kcal:  1600-1800 kcals  Protein:  105-125 gm  Fluid:  1.6-1.8 L  Bethann Humble, RD, LDN, CNSC.

## 2022-11-27 NOTE — Evaluation (Signed)
Occupational Therapy Evaluation & Discharge  Patient Details Name: [name] MRN: [id number] DOB: 02-24-1971 Today's Date: 11/27/2022   History of Present Illness 52 y.o. female presenting 11/25/22 with vertigo. CT head and MRI brain negative. refractory to additional meclizine, Valium, Reglan, and IV fluids.  PMH significant of HFpEF, COPD, CAD, DM, HTN, morbid obesity, and OSA   Clinical Impression   Zanai was evaluated s/p the above admission list. She is mod I with SPC vs RW at baseline. Upon evaluation, pt demonstrated mod I ability to complete mobility and ADLs. Pt did report dizziness upon arrival, however no other report throughout session during transfers, positional changes, head turns, or exertion. Pt does demonstrate some self-limited behaviors and needs maximal encouragement to complete self care tasks independently. Encourage frequent ambulation and OOB ADLs with staff acutely. Pt does not require further acute, or follow up OT services. Recommend discharge back to pt's environment with assist as needed. OT to sign off with appreciation of order, please re-consult if needed.         Recommendations for follow up therapy are one component of a multi-disciplinary discharge planning process, led by the attending physician.  Recommendations may be updated based on patient status, additional functional criteria and insurance authorization.      Patient can return home with the following Assist for transportation;Assistance with cooking/housework    Functional Status Assessment  Patient has had a recent decline in their functional status and demonstrates the ability to make significant improvements in function in a reasonable and predictable amount of time.  Equipment Recommendations  None recommended by OT       Precautions / Restrictions Precautions Precautions: Fall Restrictions Weight Bearing Restrictions: No      Mobility Bed Mobility Overal bed mobility:  Modified Independent             General bed mobility comments: increased time    Transfers Overall transfer level: Needs assistance Equipment used: Rolling walker (2 wheels) Transfers: Sit to/from Stand Sit to Stand: Supervision           General transfer comment: no report of dizziness. from bed, toilet and recliner      Balance Overall balance assessment: No apparent balance deficits (not formally assessed)                     ADL either performed or assessed with clinical judgement   ADL Overall ADL's : Modified independent;At baseline       General ADL Comments: With encouragement, pt completed mod I.     Vision Baseline Vision/History: 0 No visual deficits Vision Assessment?: No apparent visual deficits     Perception Perception Perception Tested?: No   Praxis Praxis Praxis tested?: Not tested    Pertinent Vitals/Pain Pain Assessment Pain Assessment: Faces Faces Pain Scale: No hurt Pain Intervention(s): Monitored during session     Hand Dominance     Extremity/Trunk Assessment Upper Extremity Assessment Upper Extremity Assessment: Overall WFL for tasks assessed   Lower Extremity Assessment Lower Extremity Assessment: Defer to PT evaluation   Cervical / Trunk Assessment Cervical / Trunk Assessment: Other exceptions Cervical / Trunk Exceptions: increased body habitus   Communication Communication Communication: No difficulties   Cognition Arousal/Alertness: Awake/alert Behavior During Therapy: WFL for tasks assessed/performed Overall Cognitive Status: Within Functional Limits for tasks assessed         General Comments: Reported dizziness upon arrival, no other report throughout ADLs. Pt needed significant encouragaement to complete ADLs  independently, initially stating she wouldnt have any trouble doing it at home but all the "stuff" (1 IV) attached to her makes it hard. Self-limiting.     General Comments  VSS.             Home Living Family/patient expects to be discharged to:: Private residence Living Arrangements: Parent Available Help at Discharge: Family;Available 24 hours/day Type of Home: Apartment Home Access: Level entry     Home Layout: One level     Bathroom Shower/Tub: Chief Strategy Officer: Standard Bathroom Accessibility: Yes   Home Equipment: Agricultural consultant (2 wheels);Cane - single point          Prior Functioning/Environment Prior Level of Function : Independent/Modified Independent             Mobility Comments: household ambulation using SPC mostly and RW PRN ADLs Comments: mod I        OT Problem List: Decreased activity tolerance         OT Goals(Current goals can be found in the care plan section) Acute Rehab OT Goals Patient Stated Goal: home OT Goal Formulation: With patient Time For Goal Achievement: 11/27/22 Potential to Achieve Goals: Good   AM-PAC OT "6 Clicks" Daily Activity     Outcome Measure Help from another person eating meals?: None Help from another person taking care of personal grooming?: None Help from another person toileting, which includes using toliet, bedpan, or urinal?: None Help from another person bathing (including washing, rinsing, drying)?: None Help from another person to put on and taking off regular upper body clothing?: None Help from another person to put on and taking off regular lower body clothing?: None 6 Click Score: 24   End of Session Equipment Utilized During Treatment: Rolling walker (2 wheels) Nurse Communication: Mobility status  Activity Tolerance: Patient tolerated treatment well Patient left: in chair;with call bell/phone within reach;with family/visitor present  OT Visit Diagnosis: Muscle weakness (generalized) (M62.81)                Time: 1601-1630 OT Time Calculation (min): 29 min Charges:  OT General Charges $OT Visit: 1 Visit OT Evaluation $OT Eval Moderate Complexity: 1 Mod OT  Treatments $Self Care/Home Management : 8-22 mins  Derenda Mis, OTR/L Acute Rehabilitation Services Office [phone] Secure Chat Communication Preferred   Donia Pounds 11/27/2022, 5:07 PM

## 2022-11-28 DIAGNOSIS — R42 Dizziness and giddiness: Secondary | ICD-10-CM | POA: Diagnosis not present

## 2022-11-28 LAB — GLUCOSE, CAPILLARY
Glucose-Capillary: 78 mg/dL (ref 70–99)
Glucose-Capillary: 113 mg/dL — ABNORMAL HIGH (ref 70–99)
Glucose-Capillary: 111 mg/dL — ABNORMAL HIGH (ref 70–99)
Glucose-Capillary: 109 mg/dL — ABNORMAL HIGH (ref 70–99)

## 2022-11-28 MED ORDER — MAGNESIUM SULFATE 2 GM/50ML IV SOLN
2.0000 g | Freq: Once | INTRAVENOUS | Status: AC
  Administered 2022-11-28: 2 g via INTRAVENOUS
  Filled 2022-11-28: qty 50

## 2022-11-28 MED ORDER — SODIUM CHLORIDE 0.9 % IV BOLUS
1000.0000 mL | Freq: Once | INTRAVENOUS | Status: AC
  Administered 2022-11-28: 1000 mL via INTRAVENOUS

## 2022-11-28 MED ORDER — MAGNESIUM OXIDE -MG SUPPLEMENT 400 (240 MG) MG PO TABS
800.0000 mg | ORAL_TABLET | Freq: Once | ORAL | Status: AC
  Administered 2022-11-28: 800 mg via ORAL
  Filled 2022-11-28: qty 2

## 2022-11-28 NOTE — [place] FL2 (Signed)
[place] MEDICAID FL2 LEVEL OF CARE FORM     IDENTIFICATION  Patient Name: [name] Birthdate: November 28, 1970 Sex: female Admission Date (Current Location): 11/25/2022  [place] and [place] Number:  Haynes Bast [id number] L Facility and Address:  [facility] [place], [address], [place], [place] [address]      Provider Number: [id number]  Attending Physician Name and Address:  Dimple Nanas, MD  Relative Name and Phone Number:  Jerina Taffe, mother - [phone]    Current Level of Care: Hospital Recommended Level of Care: Skilled Nursing Facility Prior Approval Number:    Date Approved/Denied:   PASRR Number: [id number]  Discharge Plan: SNF    Current Diagnoses: Patient Active Problem List   Diagnosis Date Noted   Orthostatic dizziness 11/27/2022   Vertigo 11/26/2022   Type 2 diabetes mellitus with morbid obesity (HCC) 09/18/2022   Moderate major depression (HCC) 05/07/2022   Influenza A 03/06/2022   Hyperlipidemia with target LDL less than 100 02/17/2022   Pain in thoracic spine 09/25/2021   Low back pain 09/25/2021   OSA (obstructive sleep apnea) 07/11/2021   Depression 05/28/2021   Encounter for care related to Port-a-Cath 05/27/2021   Morbid obesity with BMI of 50.0-59.9, adult (HCC) 05/26/2021   Fever 11/22/2020   Bilateral lower extremity edema 11/22/2020   History of DVT (deep vein thrombosis) 11/22/2020   Asthma, chronic, moderate persistent, with acute exacerbation 11/22/2020   Lymphedema 06/09/2020   Iron deficiency anemia due to chronic blood loss 05/10/2020   Venous insufficiency 04/13/2020   S/P insertion of IVC (inferior vena caval) filter 04/13/2020   Chronic anemia 04/13/2020   Essential hypertension 04/13/2020   (HFpEF) heart failure with preserved ejection fraction (HCC) -> although echo suggests normal diastolic parameters with normal left atrial size 04/13/2020   CAD (coronary artery disease) 11/17/2019     Orientation RESPIRATION BLADDER Height & Weight     Self, Time, Situation, Place  Normal (Wears CPAP at night - able to bring CPAP from home) Continent Weight: 126.1 kg Height:  5\' 3"  (160 cm)  BEHAVIORAL SYMPTOMS/MOOD NEUROLOGICAL BOWEL NUTRITION STATUS      Continent Diet (See discharge summary)  AMBULATORY STATUS COMMUNICATION OF NEEDS Skin   Limited Assist Verbally Normal                       Personal Care Assistance Level of Assistance  Bathing, Feeding, Dressing Bathing Assistance: Independent Feeding assistance: Independent Dressing Assistance: Limited assistance     Functional Limitations Info  Sight, Hearing, Speech Sight Info: Impaired (wears glasses) Hearing Info: Adequate Speech Info: Adequate    SPECIAL CARE FACTORS FREQUENCY  PT (By licensed PT), OT (By licensed OT)     PT Frequency: 5 x per week OT Frequency: 5 x per week            Contractures Contractures Info: Not present    Additional Factors Info  Code Status, Allergies, Psychotropic, Insulin Sliding Scale Code Status Info: Full Code Allergies Info: Ace Inhibitors, Aspirin, Hydromorphone, Vancomycin, contrast media, Dilaudid, Lidocaine Psychotropic Info: Atarax, Paxil, Trazodone Insulin Sliding Scale Info: Novolog Insulin - Moderate sliding scale 3 x day with meals and QHS       Current Medications (11/28/2022):  This is the current hospital active medication list Current Facility-Administered Medications  Medication Dose Route Frequency Provider Last Rate Last Admin   acetaminophen (TYLENOL) tablet 650 mg  650 mg Oral Q6H PRN [name], MD  650 mg at 11/26/22 1520   Or   acetaminophen (TYLENOL) suppository 650 mg  650 mg Rectal Q6H PRN [name], MD       apixaban Everlene Balls) tablet 5 mg  5 mg Oral BID [name], MD   5 mg at 11/28/22 0859   atorvastatin (LIPITOR) tablet 40 mg  40 mg Oral Daily [name], MD   40 mg at 11/28/22 0859   carvedilol (COREG)  tablet 25 mg  25 mg Oral BID WC [name], MD   25 mg at 11/28/22 0859   docusate sodium (COLACE) capsule 100 mg  100 mg Oral BID [name], MD   100 mg at 11/28/22 4401   ferrous sulfate tablet 325 mg  325 mg Oral Q breakfast [name], MD   325 mg at 11/28/22 0859   gabapentin (NEURONTIN) capsule 100 mg  100 mg Oral TID [name], MD   100 mg at 11/28/22 0859   guaiFENesin (ROBITUSSIN) 100 MG/5ML liquid 5 mL  5 mL Oral Q4H PRN Amin, Ankit Chirag, MD       hydrALAZINE (APRESOLINE) injection 10 mg  10 mg Intravenous Q4H PRN Amin, Ankit Chirag, MD       hydrOXYzine (ATARAX) tablet 25 mg  25 mg Oral QHS PRN [name], MD       insulin aspart (novoLOG) injection 0-15 Units  0-15 Units Subcutaneous TID WC [name], MD   2 Units at 11/27/22 1240   insulin aspart (novoLOG) injection 0-5 Units  0-5 Units Subcutaneous QHS [name], MD       ipratropium-albuterol (DUONEB) 0.5-2.5 (3) MG/3ML nebulizer solution 3 mL  3 mL Nebulization Q4H PRN Amin, Loura Halt, MD       meclizine (ANTIVERT) tablet 25 mg  25 mg Oral TID PRN [name], MD   25 mg at 11/28/22 0422   methocarbamol (ROBAXIN) tablet 500 mg  500 mg Oral BID PRN [name], MD   500 mg at 11/26/22 1520   metoprolol tartrate (LOPRESSOR) injection 5 mg  5 mg Intravenous Q4H PRN Amin, Loura Halt, MD       mometasone-formoterol (DULERA) 200-5 MCG/ACT inhaler 2 puff  2 puff Inhalation BID [name], MD   2 puff at 11/27/22 2136   ondansetron (ZOFRAN) injection 4 mg  4 mg Intravenous Q6H PRN Amin, Ankit Chirag, MD       oxyCODONE (Oxy IR/ROXICODONE) immediate release tablet 5 mg  5 mg Oral Q4H PRN [name], MD   5 mg at 11/27/22 2136   PARoxetine (PAXIL) tablet 20 mg  20 mg Oral Daily [name], MD   20 mg at 11/28/22 0859   polyethylene glycol (MIRALAX / GLYCOLAX) packet 17 g  17 g Oral Daily PRN [name], MD       senna-docusate (Senokot-S) tablet 1 tablet  1 tablet Oral  QHS PRN Amin, Ankit Chirag, MD       sodium chloride flush (NS) 0.9 % injection 3 mL  3 mL Intravenous Q12H [name], MD   3 mL at 11/27/22 2136   traZODone (DESYREL) tablet 50 mg  50 mg Oral QHS PRN Dimple Nanas, MD         Discharge Medications: Please see discharge summary for a list of discharge medications.  Relevant Imaging Results:  Relevant Lab Results:   Additional Information SS# [id number]  Janae Bridgeman, RN

## 2022-11-28 NOTE — TOC Progression Note (Addendum)
Transition of Care [facility]) - Progression Note    Patient Details  Name: [name] MRN: [id number] Date of Birth: 11-Jan-1971  Transition of Care [facility]) CM/SW Contact  Janae Bridgeman, RN Phone Number: 11/28/2022, 10:29 AM  Clinical Narrative:    CM spoke with therapy this morning and the patient is recommended for SNF at this time.  I spoke with the patient at the bedside and she was agreeable to placement.  The patient states that she was unaware that she had [employer] coverage.  I called Admitting department and patient was confirmed to have [employer] coverage.    The patient was agreeable to SNF and patient will be faxed out for bed offers.  MSW to follow up regarding SNF placement.  The patient states that she receives a disability check since 2002 and lives with her mother at the apartment.  The patient states that her brother is a MD from [place] state and is visiting in [place] at the home at this time.  SNF workup will be started and clinicals faxed out in the hub for bed offers.  11/28/22 - 1645 - Patient provided with bed offers including [facility] and [facility] and the patient chose [facility].  I called Reyne Dumas, CM at the facility and she will have business office start the insurance authorization.      Expected Discharge Plan: Skilled Nursing Facility Barriers to Discharge: Continued Medical Work up  Expected Discharge Plan and Services   Discharge Planning Services: CM Consult Post Acute Care Choice: Skilled Nursing Facility Living arrangements for the past 2 months: Apartment                                       Social Determinants of Health (SDOH) Interventions SDOH Screenings   Food Insecurity: No Food Insecurity (11/26/2022)  Housing: Low Risk  (11/26/2022)  Transportation Needs: No Transportation Needs (11/26/2022)  Utilities: Not At Risk (11/26/2022)  Alcohol Screen: Low Risk  (09/02/2022)  Depression (PHQ2-9): Low Risk   (09/05/2022)  Financial Resource Strain: Low Risk  (09/02/2022)  Physical Activity: Insufficiently Active (09/02/2022)  Social Connections: Moderately Isolated (09/02/2022)  Stress: No Stress Concern Present (09/02/2022)  Tobacco Use: Medium Risk (11/25/2022)    Readmission Risk Interventions    11/28/2022   10:29 AM  Readmission Risk Prevention Plan  Post Dischage Appt Complete  Medication Screening Complete  Transportation Screening Complete

## 2022-11-28 NOTE — Progress Notes (Signed)
PROGRESS NOTE    Charletha [name]  BJS:[id number] DOB: 1971/01/10 DOA: 11/25/2022 PCP: Marcine Matar, MD   Brief Narrative:   HPI: [name] is a 52 y.o. female with medical history significant of HFpEF, COPD, CAD, DM, HTN, morbid obesity, and OSA presenting with vertigo. She reports 1 day of head spinning, dizziness.  Upon admission CT of the head and MRI brain were negative.  Routine metabolic workup overall was negative as well.   Assessment & Plan:  Principal Problem:   Vertigo Active Problems:   Hyperlipidemia with target LDL less than 100   Chronic anemia   Essential hypertension   (HFpEF) heart failure with preserved ejection fraction (HCC) -> although echo suggests normal diastolic parameters with normal left atrial size   CAD (coronary artery disease)   History of DVT (deep vein thrombosis)   Depression   OSA (obstructive sleep apnea)   Type 2 diabetes mellitus with morbid obesity (HCC)   Orthostatic dizziness     Vertigo -Workup for stroke has been negative for acute stroke including CT head and MRI brain.  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  TSH, random cortisol negative.  Advised nursing staff to obtain orthostatics and document in the computer. Still dizzy with somewhat low BP. Ordered NS bolus. Try Unna boots or compressive stocking. Elevated legs when sitting or laying.   Family confirms that patient does have slight facial asymmetry especially on the left side of the face including forehead.  Also mother confirms that she at times stutters with her speech.  All of these issues have been chronic for her.   Anemia/thrombocytopenia Follows outpatient hematology.  Platelets stable   Hypokalemia Replete as needed   HFpEF/CAD -04/2021 echo with normal EF and normal diastolic parameters Will hold diuretics.  Repeat echo shows stable ef.    COPD As needed bronchodilators   Chronic pain -Continue gabapentin, methocarbamol    DM A1c 5.5, metformin on hold.  Currently on sliding scale and Accu-Cheks   Chronic DVT Status post DC filter.  Continue Eliquis   HTN Continue Coreg.  IV as needed.   HLD -Continue atorvastatin   Mood d/o -Continue paroxetine, hydralazine   Morbid obesity -Body mass index is 49.25 kg/m.. -Has lost significant weight on Ozempic (holding while inpatient) -Weight loss should be encouraged on an ongoing basis -Outpatient PCP/bariatric medicine/bariatric surgery f/u encouraged    OSA CPAP      DVT prophylaxis: Eliquis Code Status: Full code Family Communication:   Still quite dizzy and soft blood pressure this morning.  IV bolus ordered       Diet Orders (From admission, onward)     Start     Ordered   11/26/22 0910  Diet heart healthy/carb modified Room service appropriate? Yes; Fluid consistency: Thin  Diet effective now       Question Answer Comment  Diet-HS Snack? Nothing   Room service appropriate? Yes   Fluid consistency: Thin      11/26/22 0914            Subjective: Sitting up in the chair, gets dizzy especially with standing up.    Examination:  General exam: Appears calm and comfortable; obese.  Respiratory system: Clear to auscultation. Respiratory effort normal. Cardiovascular system: S1 & S2 heard, RRR. No JVD, murmurs, rubs, gallops or clicks. No pedal edema. Gastrointestinal system: Abdomen is nondistended, soft and nontender. No organomegaly or masses felt. Normal bowel sounds heard. Central nervous system: Alert and oriented.  Some left facial asymmetry Extremities: Symmetric 5 x 5 power. Skin: No rashes, lesions or ulcers Psychiatry: Judgement and insight appear normal. Mood & affect appropriate.  Objective: Vitals:   11/28/22 0743 11/28/22 0904 11/28/22 0916 11/28/22 0920  BP: 119/70 93/75 (!) 88/77 109/70  Pulse: 95 93 87 97  Resp: 16 16 17    Temp: 97.8 F (36.6 C) 97.9 F (36.6 C)    TempSrc: Oral Oral    SpO2: 100% 99%  100% 100%  Weight:      Height:        Intake/Output Summary (Last 24 hours) at 11/28/2022 1236 Last data filed at 11/27/2022 1640 Gross per 24 hour  Intake 100 ml  Output --  Net 100 ml   Filed Weights   11/26/22 0327  Weight: 126.1 kg    Scheduled Meds:  apixaban  5 mg Oral BID   atorvastatin  40 mg Oral Daily   carvedilol  25 mg Oral BID WC   docusate sodium  100 mg Oral BID   ferrous sulfate  325 mg Oral Q breakfast   gabapentin  100 mg Oral TID   insulin aspart  0-15 Units Subcutaneous TID WC   insulin aspart  0-5 Units Subcutaneous QHS   mometasone-formoterol  2 puff Inhalation BID   PARoxetine  20 mg Oral Daily   sodium chloride flush  3 mL Intravenous Q12H   Continuous Infusions:  sodium chloride      Nutritional status     Body mass index is 49.25 kg/m.  Data Reviewed:   CBC: Recent Labs  Lab 11/25/22 1524 11/26/22 0541 11/27/22 0006 11/27/22 2325  WBC 5.6 5.9 4.5 5.6  NEUTROABS  --  4.5  --   --   HGB 11.3* 9.7* 9.9* 9.9*  HCT 36.9 33.1* 33.1* 34.6*  MCV 90.7 93.0 93.0 95.1  PLT 117* 99* 111* 116*   Basic Metabolic Panel: Recent Labs  Lab 11/25/22 1524 11/26/22 0541 11/27/22 0006 11/27/22 2325  NA 139 139 136 136  K 3.5 3.0* 3.5 3.8  CL 102 103 103 102  CO2 28 25 25 23   GLUCOSE 94 102* 116* 126*  BUN 11 10 13 16   CREATININE 0.77 0.84 0.94 0.84  CALCIUM 9.9 9.0 8.7* 8.9  MG  --  1.8  --  1.6*   GFR: Estimated Creatinine Clearance: 99 mL/min (by C-G formula based on SCr of 0.84 mg/dL). Liver Function Tests: Recent Labs  Lab 11/25/22 1524 11/26/22 0541  AST 10* 12*  ALT <5 8  ALKPHOS 65 54  BILITOT 0.9 0.7  PROT 7.9 6.5  ALBUMIN 4.1 2.9*   No results for input(s): "LIPASE", "AMYLASE" in the last 168 hours. No results for input(s): "AMMONIA" in the last 168 hours. Coagulation Profile: No results for input(s): "INR", "PROTIME" in the last 168 hours. Cardiac Enzymes: No results for input(s): "CKTOTAL", "CKMB", "CKMBINDEX",  "TROPONINI" in the last 168 hours. BNP (last 3 results) No results for input(s): "PROBNP" in the last 8760 hours. HbA1C: No results for input(s): "HGBA1C" in the last 72 hours. CBG: Recent Labs  Lab 11/27/22 1949 11/27/22 2144 11/27/22 2306 11/28/22 0829 11/28/22 1231  GLUCAP 102* 104* 113* 78 113*   Lipid Profile: No results for input(s): "CHOL", "HDL", "LDLCALC", "TRIG", "CHOLHDL", "LDLDIRECT" in the last 72 hours. Thyroid Function Tests: Recent Labs    11/27/22 0854  TSH 0.834   Anemia Panel: No results for input(s): "VITAMINB12", "FOLATE", "FERRITIN", "TIBC", "IRON", "RETICCTPCT" in the last 72  hours. Sepsis Labs: No results for input(s): "PROCALCITON", "LATICACIDVEN" in the last 168 hours.  Recent Results (from the past 240 hour(s))  Resp panel by RT-PCR (RSV, Flu A&B, Covid) Anterior Nasal Swab     Status: None   Collection Time: 11/25/22  7:29 PM   Specimen: Anterior Nasal Swab  Result Value Ref Range Status   SARS Coronavirus 2 by RT PCR NEGATIVE NEGATIVE Final    Comment: (NOTE) SARS-CoV-2 target nucleic acids are NOT DETECTED.  The SARS-CoV-2 RNA is generally detectable in upper respiratory specimens during the acute phase of infection. The lowest concentration of SARS-CoV-2 viral copies this assay can detect is 138 copies/mL. A negative result does not preclude SARS-Cov-2 infection and should not be used as the sole basis for treatment or other patient management decisions. A negative result may occur with  improper specimen collection/handling, submission of specimen other than nasopharyngeal swab, presence of viral mutation(s) within the areas targeted by this assay, and inadequate number of viral copies(<138 copies/mL). A negative result must be combined with clinical observations, patient history, and epidemiological information. The expected result is Negative.  Fact Sheet for Patients:  BloggerCourse.com  Fact Sheet for  Healthcare Providers:  SeriousBroker.it  This test is no t yet approved or cleared by the [place] FDA and  has been authorized for detection and/or diagnosis of SARS-CoV-2 by FDA under an Emergency Use Authorization (EUA). This EUA will remain  in effect (meaning this test can be used) for the duration of the COVID-19 declaration under Section 564(b)(1) of the Act, 21 U.S.C.section 360bbb-3(b)(1), unless the authorization is terminated  or revoked sooner.       Influenza A by PCR NEGATIVE NEGATIVE Final   Influenza B by PCR NEGATIVE NEGATIVE Final    Comment: (NOTE) The Xpert Xpress SARS-CoV-2/FLU/RSV plus assay is intended as an aid in the diagnosis of influenza from Nasopharyngeal swab specimens and should not be used as a sole basis for treatment. Nasal washings and aspirates are unacceptable for Xpert Xpress SARS-CoV-2/FLU/RSV testing.  Fact Sheet for Patients: BloggerCourse.com  Fact Sheet for Healthcare Providers: SeriousBroker.it  This test is not yet approved or cleared by the [place] FDA and has been authorized for detection and/or diagnosis of SARS-CoV-2 by FDA under an Emergency Use Authorization (EUA). This EUA will remain in effect (meaning this test can be used) for the duration of the COVID-19 declaration under Section 564(b)(1) of the Act, 21 U.S.C. section 360bbb-3(b)(1), unless the authorization is terminated or revoked.     Resp Syncytial Virus by PCR NEGATIVE NEGATIVE Final    Comment: (NOTE) Fact Sheet for Patients: BloggerCourse.com  Fact Sheet for Healthcare Providers: SeriousBroker.it  This test is not yet approved or cleared by the [place] FDA and has been authorized for detection and/or diagnosis of SARS-CoV-2 by FDA under an Emergency Use Authorization (EUA). This EUA will remain in effect (meaning this  test can be used) for the duration of the COVID-19 declaration under Section 564(b)(1) of the Act, 21 U.S.C. section 360bbb-3(b)(1), unless the authorization is terminated or revoked.  Performed at [facility], [address], [place], [place] [address]          Radiology Studies: ECHOCARDIOGRAM COMPLETE  Result Date: 11/27/2022    ECHOCARDIOGRAM REPORT   Patient Name:   RICKEL CETINA Date of Exam: 11/27/2022 Medical Rec #:  [id number]               Height:  63.0 in Accession #:    [id number]              Weight:       278.0 lb Date of Birth:  1970/12/07                BSA:          2.224 m Patient Age:    52 years                BP:           87/64 mmHg Patient Gender: F                       HR:           104 bpm. Exam Location:  Inpatient Procedure: 2D Echo, Cardiac Doppler, Color Doppler and Intracardiac            Opacification Agent Indications:    R55 Syncope  History:        Patient has prior history of Echocardiogram examinations, most                 recent 05/26/2021. CAD, COPD; Risk Factors:Diabetes and                 Hypertension. HFpEF.  Sonographer:    Harriette Bouillon RDCS Referring Phys: [phone] Emit Kuenzel CHIRAG Juriel Cid IMPRESSIONS  1. Left ventricular ejection fraction, by estimation, is 60 to 65%. The left ventricle has normal function. The left ventricle has no regional wall motion abnormalities. Left ventricular diastolic parameters are indeterminate.  2. Right ventricular systolic function is normal. The right ventricular size is normal.  3. The mitral valve is normal in structure. No evidence of mitral valve regurgitation. No evidence of mitral stenosis.  4. The aortic valve is normal in structure. Aortic valve regurgitation is not visualized. No aortic stenosis is present.  5. The inferior vena cava is normal in size with greater than 50% respiratory variability, suggesting right atrial pressure of 3 mmHg. Comparison(s): Prior images reviewed side by  side. FINDINGS  Left Ventricle: Left ventricular ejection fraction, by estimation, is 60 to 65%. The left ventricle has normal function. The left ventricle has no regional wall motion abnormalities. The left ventricular internal cavity size was normal in size. There is  no left ventricular hypertrophy. Left ventricular diastolic parameters are indeterminate. Right Ventricle: The right ventricular size is normal. No increase in right ventricular wall thickness. Right ventricular systolic function is normal. Left Atrium: Left atrial size was normal in size. Right Atrium: Right atrial size was normal in size. Pericardium: There is no evidence of pericardial effusion. Mitral Valve: The mitral valve is normal in structure. No evidence of mitral valve regurgitation. No evidence of mitral valve stenosis. Tricuspid Valve: The tricuspid valve is normal in structure. Tricuspid valve regurgitation is not demonstrated. No evidence of tricuspid stenosis. Aortic Valve: The aortic valve is normal in structure. Aortic valve regurgitation is not visualized. No aortic stenosis is present. Pulmonic Valve: The pulmonic valve was normal in structure. Pulmonic valve regurgitation is not visualized. No evidence of pulmonic stenosis. Aorta: The aortic root is normal in size and structure. Venous: The inferior vena cava is normal in size with greater than 50% respiratory variability, suggesting right atrial pressure of 3 mmHg. IAS/Shunts: No atrial level shunt detected by color flow Doppler.  LEFT VENTRICLE PLAX 2D LVIDd:         4.70 cm LVIDs:  3.40 cm LV PW:         1.10 cm LV IVS:        1.10 cm LVOT diam:     2.10 cm LV SV:         45 LV SV Index:   20 LVOT Area:     3.46 cm  LEFT ATRIUM           Index LA diam:      3.30 cm 1.48 cm/m LA Vol (A4C): 38.7 ml 17.40 ml/m  AORTIC VALVE LVOT Vmax:   76.40 cm/s LVOT Vmean:  58.200 cm/s LVOT VTI:    0.131 m  AORTA Ao Root diam: 2.90 cm Ao Asc diam:  3.00 cm  SHUNTS Systemic VTI:  0.13  m Systemic Diam: 2.10 cm Donato Schultz MD Electronically signed by Donato Schultz MD Signature Date/Time: 11/27/2022/2:22:18 PM    Final            LOS: 1 day   Time spent= 35 mins    Thersea Manfredonia Joline Maxcy, MD [facility]  If 7PM-7AM, please contact night-coverage  11/28/2022, 12:36 PM

## 2022-11-28 NOTE — Progress Notes (Signed)
Physical Therapy Treatment Patient Details Name: [name] MRN: [id number] DOB: 03-Jul-1970 Today's Date: 11/28/2022   History of Present Illness 52 y.o. female presenting 11/25/22 with vertigo. CT head and MRI brain negative. refractory to additional meclizine, Valium, Reglan, and IV fluids. 7/30 testing for vertigo negative.   PMH significant of HFpEF, COPD, CAD, DM, HTN, morbid obesity, and OSA    PT Comments  Pt greeted up in chair on arrival and agreeable to session with slow progress towards acute goals due to pt stated continued dizziness with mobility. Pt able to transfer to stand with min guard for safety and demonstrate gait with RW support at min guard level for x2 short household distances. Pt with x2 instances of exaggerated startle response and c/o sudden onset of dizziness. Pt stating dizziness improving with sitting between gait bouts. Will re-assess recommendation next session and pending progress and home assist level pt may benefit from post acute inpatient therapy <3 hr/day. Pt continues to benefit from skilled PT services to progress toward functional mobility goals.     If plan is discharge home, recommend the following: A little help with walking and/or transfers;A little help with bathing/dressing/bathroom;Assistance with cooking/housework;Help with stairs or ramp for entrance   Can travel by private vehicle        Equipment Recommendations  None recommended by PT    Recommendations for Other Services       Precautions / Restrictions Precautions Precautions: Fall Restrictions Weight Bearing Restrictions: No     Mobility  Bed Mobility Overal bed mobility: Modified Independent             General bed mobility comments: pt up in chair on arrival    Transfers Overall transfer level: Needs assistance Equipment used: Rolling walker (2 wheels) Transfers: Sit to/from Stand Sit to Stand: Supervision           General transfer comment: no  report of dizziness on standing    Ambulation/Gait Ambulation/Gait assistance: Min guard Gait Distance (Feet): 15 Feet (+ 5') Assistive device: Rolling walker (2 wheels) Gait Pattern/deviations: Step-through pattern, Decreased stride length Gait velocity: slow     General Gait Details: pt without imbalance but twice with "startle response" stating sudden dizziness and needing to sit, cues for upright posture and close RW proximity   Stairs             Wheelchair Mobility     Tilt Bed    Modified Rankin (Stroke Patients Only)       Balance Overall balance assessment: No apparent balance deficits (not formally assessed)                                          Cognition Arousal/Alertness: Awake/alert Behavior During Therapy: WFL for tasks assessed/performed Overall Cognitive Status: Within Functional Limits for tasks assessed                                 General Comments: x2 startle? reflex during ambualtion with pt stating sudden onset of dizziness and needing to sit, subjective complaints inconsistent with objecive observations, self limiting        Exercises      General Comments General comments (skin integrity, edema, etc.): VSS on RA      Pertinent Vitals/Pain Pain Assessment Pain Assessment: Faces Faces Pain Scale: No  hurt Pain Intervention(s): Monitored during session    Home Living                          Prior Function            PT Goals (current goals can now be found in the care plan section) Acute Rehab PT Goals Patient Stated Goal: stop feeling dizzy PT Goal Formulation: With patient Time For Goal Achievement: 12/10/22 Progress towards PT goals: Not progressing toward goals - comment (dizziness)    Frequency    Min 1X/week      PT Plan      Co-evaluation              AM-PAC PT "6 Clicks" Mobility   Outcome Measure  Help needed turning from your back to your side  while in a flat bed without using bedrails?: None Help needed moving from lying on your back to sitting on the side of a flat bed without using bedrails?: A Little Help needed moving to and from a bed to a chair (including a wheelchair)?: A Little Help needed standing up from a chair using your arms (e.g., wheelchair or bedside chair)?: A Little Help needed to walk in hospital room?: A Little Help needed climbing 3-5 steps with a railing? : A Lot 6 Click Score: 18    End of Session   Activity Tolerance: Patient tolerated treatment well Patient left: with call bell/phone within reach;in chair Nurse Communication: Mobility status PT Visit Diagnosis: Dizziness and giddiness (R42)     Time: 0937-1000 PT Time Calculation (min) (ACUTE ONLY): 23 min  Charges:    $Gait Training: 8-22 mins $Therapeutic Activity: 8-22 mins PT General Charges $$ ACUTE PT VISIT: 1 Visit                     Tobi Bastos R. PTA Acute Rehabilitation Services Office: [phone]   Catalina Antigua 11/28/2022, 1:49 PM

## 2022-11-28 NOTE — Progress Notes (Signed)
Pt placed on CPAP for the night.    11/28/22 2254  BiPAP/CPAP/SIPAP  $ Non-Invasive Ventilator  Non-Invasive Vent Subsequent  BiPAP/CPAP/SIPAP Pt Type Adult  BiPAP/CPAP/SIPAP Resmed  Mask Type Full face mask  Mask Size Medium  Respiratory Rate 16 breaths/min  EPAP 8 cmH2O  FiO2 (%) 21 %  Minute Ventilation 8.5  Leak 7  Tidal Volume (Vt) 534  Nasal massage performed Yes  CPAP/SIPAP surface wiped down Yes  BiPAP/CPAP /SiPAP Vitals  Pulse Rate 93  Bilateral Breath Sounds Clear;Diminished  MEWS Score/Color  MEWS Score 0  MEWS Score Color Chilton Si

## 2022-11-28 NOTE — Plan of Care (Signed)

## 2022-11-29 ENCOUNTER — Other Ambulatory Visit: Payer: Self-pay | Admitting: Internal Medicine

## 2022-11-29 DIAGNOSIS — F321 Major depressive disorder, single episode, moderate: Secondary | ICD-10-CM

## 2022-11-29 DIAGNOSIS — R42 Dizziness and giddiness: Secondary | ICD-10-CM | POA: Diagnosis not present

## 2022-11-29 LAB — GLUCOSE, CAPILLARY
Glucose-Capillary: 123 mg/dL — ABNORMAL HIGH (ref 70–99)
Glucose-Capillary: 101 mg/dL — ABNORMAL HIGH (ref 70–99)
Glucose-Capillary: 97 mg/dL (ref 70–99)
Glucose-Capillary: 156 mg/dL — ABNORMAL HIGH (ref 70–99)

## 2022-11-29 MED ORDER — BUTALBITAL-APAP-CAFFEINE 50-325-40 MG PO TABS
1.0000 | ORAL_TABLET | Freq: Four times a day (QID) | ORAL | Status: DC | PRN
  Administered 2022-11-29 – 2022-11-30 (×4): 1 via ORAL
  Filled 2022-11-29 (×4): qty 1

## 2022-11-29 MED ORDER — CARVEDILOL 6.25 MG PO TABS: 6.2500 mg | ORAL_TABLET | Freq: Two times a day (BID) | ORAL | Status: DC

## 2022-11-29 MED ORDER — DIAZEPAM 2 MG PO TABS
2.0000 mg | ORAL_TABLET | Freq: Three times a day (TID) | ORAL | Status: DC | PRN
  Administered 2022-11-29 – 2022-11-30 (×2): 2 mg via ORAL
  Filled 2022-11-29 (×2): qty 1

## 2022-11-29 MED ORDER — PREDNISONE 20 MG PO TABS
40.0000 mg | ORAL_TABLET | Freq: Every day | ORAL | Status: DC
  Administered 2022-11-29 – 2022-11-30 (×2): 40 mg via ORAL
  Filled 2022-11-29 (×2): qty 2

## 2022-11-29 NOTE — Consult Note (Signed)
Neurology Consultation Reason for Consult: dizziness Referring Physician: Dr. Nelson Chimes  CC: dizziness  History is obtained from: patient and chart review  HPI: [name] is a 52 y.o. female with medical history significant of HFpEF, COPD, CAD, DM, HTN, morbid obesity, and OSA presenting with dizziness, headache, slurred speech and slight facial droop worsening over the past week. CTH and MRI negative for acute process. She was given Valium, meclizine, and Reglan in the ER with improvement in symptoms  On exam, patient has dysarthria, slight facial asymmetry. She c/o pain around her right ear, constant ringing in that ear, headache on the right frontal area and dizziness with any movement. 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 No nystagmus, decreased vision in the right upper field (wears glasses at baseline).  Patient states the dizziness and headache started around a week ago, about the same time she felt pressure and a sore on her right ear. She has not been on any antibiotics or steroid treatment for this. She states that the pain is dull, constant. Denies recent illness, vaccinations. Denies trouble swallowing. Patient denied seeing an ENT or being on antibiotic for the ear sore.     ROS: A 14 point ROS was performed and is negative except as noted in the HPI.   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   Morbid obesity due to excess calories (HCC)    OSA (obstructive sleep apnea) 07/11/2021   Sleep study April 06, 2019 (Dr. Craige Cotta): Moderate OSA (AHI of 24.4 and SPO2 low of 77%).  Majority events during REM sleep.-recommend CPAP, oral appliance or surgical. Reviewed sleep hygiene.  Avoid sedatives.     Osteoarthritis of left knee    Prediabetes    Small bowel obstruction (HCC)    as a child   Speech impediment    Stutter / stammer    Family History  Problem Relation Age of Onset   Diabetes Mellitus II Mother    COPD Father    Diabetes Father    Colon cancer Father    Diabetes Mellitus II Maternal Grandmother    Breast cancer Paternal Grandfather    Liver disease Neg Hx    Esophageal cancer Neg Hx     Social History:  reports that she quit smoking about 20 years ago. Her smoking use included cigarettes. She started smoking about 38 years ago. She has a 4.5 pack-year smoking history. She has never used smokeless tobacco. She reports that she does not currently use  alcohol. She reports that she does not currently use drugs.  Exam: Current vital signs: BP 106/75 (BP Location: Left Arm)   Pulse 91   Temp 97.9 F (36.6 C)   Resp 17   Ht 5\' 3"  (1.6 m)   Wt 126.1 kg   SpO2 100%   BMI 49.25 kg/m  Vital signs in last 24 hours: Temp:  [97.4 F (36.3 C)-98.3 F (36.8 C)] 97.9 F (36.6 C) (08/02 0802) Pulse Rate:  [87-97] 91 (08/02 0802) Resp:  [16-18] 17 (08/02 0434) BP: (88-113)/(68-77) 106/75 (08/02 0802) SpO2:  [99 %-100 %] 100 % (08/02 0802) FiO2 (%):  [21 %] 21 % (08/01 2254)   Physical  Exam  Appears well-developed and well-nourished.   Neuro: Mental Status: Patient is awake, alert, oriented to person, place, month, year, and situation. Patient is able to give a clear and coherent history. No signs of aphasia or neglect.  Dysarthric speech Cranial Nerves: II: Decreased right upper visual field Pupils are equal, round, and reactive to light.   III,IV, VI: EOMI without diplopia, ptosis to R eye (baseline). No nystagmus.  V: Facial sensation is symmetric to temperature VII: slight left facial droop, unsymmetrical movement VIII: hearing is intact to voice X: Uvula elevates symmetrically XI: Shoulder shrug is symmetric. XII: tongue is midline without atrophy or fasciculations.  Motor: Tone is normal. Bulk is normal. 5/5 strength BUE, 4+/5 BLE due to edema.  Sensory: Sensation is symmetric to light touch and temperature in the arms and legs. Cerebellar: FNF and HKS are intact bilaterally   I have reviewed the images obtained:  CTH: negative acute.  MRI Brain: Negative acute. 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  Impression: [name] is a 52 y.o. female with medical history significant of HFpEF, COPD, CAD, DM, HTN, morbid obesity, and OSA presenting with dizziness, headache, slurred speech and slight facial droop worsening over the past week. CTH and MRI negative for acute process. As her headache and dizziness started at the same time that her ear pain, along with what she calls a sore in her ear, it is likely that her dizziness is caused by some vestibular issue stemming from ear inflammation, etc. Patient denied seeing an ENT or being on antibiotic for the ear sore.   Recommendations: 1) orthostatic vitals 2) vestibular PT 3) ?ENT consult    Pt seen by Neuro NP/APP and later by MD. Note/plan to be edited by MD as needed.    Lynnae January, DNP, AGACNP-BC [facility] Please use AMION for contact information & EPIC for messaging.  I  have seen the patient reviewed the above note.  She has no findings on exam suggestive of either peripheral or central vertigo, however she does describe room spinning type sensation.  She has some ear pain and some ringing in her right ear which may be related.  I do think it might be reasonable to follow-up as an outpatient with ENT.  At this point, no further recommendations from a neurological standpoint, recommend vestibular PT.  Neurology will be available on an as-needed basis.  Ritta Slot, MD [facility] [phone]  If 7pm- 7am, please page neurology on call as listed in AMION.

## 2022-11-29 NOTE — TOC Progression Note (Signed)
Transition of Care [facility]) - Progression Note    Patient Details  Name: [name] MRN: [id number] Date of Birth: June 17, 1970  [facility] CM/SW Contact  Eduard Roux, [place] Phone Number: 11/29/2022, 1:32 PM  Clinical Narrative:     Insurance is still pending w/ [name]   Antony Blackbird, MSW, LCSW Clinical Social Worker    Expected Discharge Plan: Skilled Nursing Facility Barriers to Discharge: Continued Medical Work up  Expected Discharge Plan and Services   Discharge Planning Services: CM Consult Post Acute Care Choice: Skilled Nursing Facility Living arrangements for the past 2 months: Apartment                                       Social Determinants of Health (SDOH) Interventions SDOH Screenings   Food Insecurity: No Food Insecurity (11/26/2022)  Housing: Low Risk  (11/26/2022)  Transportation Needs: No Transportation Needs (11/26/2022)  Utilities: Not At Risk (11/26/2022)  Alcohol Screen: Low Risk  (09/02/2022)  Depression (PHQ2-9): Low Risk  (09/05/2022)  Financial Resource Strain: Low Risk  (09/02/2022)  Physical Activity: Insufficiently Active (09/02/2022)  Social Connections: Moderately Isolated (09/02/2022)  Stress: No Stress Concern Present (09/02/2022)  Tobacco Use: Medium Risk (11/25/2022)    Readmission Risk Interventions    11/28/2022   10:29 AM  Readmission Risk Prevention Plan  Post Dischage Appt Complete  Medication Screening Complete  Transportation Screening Complete

## 2022-11-29 NOTE — Plan of Care (Signed)

## 2022-11-29 NOTE — Plan of Care (Signed)
  Problem: Education: Goal: Knowledge of General Education information will improve Description Including pain rating scale, medication(s)/side effects and non-pharmacologic comfort measures Outcome: Progressing   

## 2022-11-29 NOTE — Progress Notes (Signed)
Physical Therapy Treatment Patient Details Name: [name] MRN: [id number] DOB: [birth year] Today's Date: 11/29/2022   History of Present Illness 52 y.o. female presenting 11/25/22 with vertigo. CT head and MRI brain negative. refractory to additional meclizine, Valium, Reglan, and IV fluids. 7/30 testing for vertigo negative.   PMH significant of HFpEF, COPD, CAD, DM, HTN, morbid obesity, and OSA    PT Comments  Pt greeted resting in bed and agreeable to session with incremental progress towards acute goals. Pt able to complete x2 short household distance gait bouts in room with min guard for safety and mod cues for safety as pt with x1 startle response with sudden onset dizziness and pt reaching hand off RW toward sink. Educated pt on importance of maintaining UE contact with RW during mobility with pt able to recall via teach-back at end of session for further safety with mobility reinforcement. Pt continues to fatigue quickly needing cues to recall energy conservation strategies with pt able to recall 0/3 reviewed strategies. Pt continues to benefit from skilled PT services to progress toward functional mobility goals.      If plan is discharge home, recommend the following: A little help with walking and/or transfers;A little help with bathing/dressing/bathroom;Assistance with cooking/housework;Help with stairs or ramp for entrance   Can travel by private vehicle        Equipment Recommendations  None recommended by PT    Recommendations for Other Services       Precautions / Restrictions Precautions Precautions: Fall Restrictions Weight Bearing Restrictions: No     Mobility  Bed Mobility Overal bed mobility: Modified Independent Bed Mobility: Supine to Sit     Supine to sit: Supervision     General bed mobility comments: supervision for safety, increased effort but no assist needed    Transfers Overall transfer level: Needs assistance Equipment used: Rolling  walker (2 wheels) Transfers: Sit to/from Stand Sit to Stand: Supervision           General transfer comment: no report of dizziness on standing, able to rise from EOB and low commode without assist    Ambulation/Gait Ambulation/Gait assistance: Min guard Gait Distance (Feet): 15 Feet (x2) Assistive device: Rolling walker (2 wheels) Gait Pattern/deviations: Step-through pattern, Decreased stride length Gait velocity: slow     General Gait Details: very slow and eoffortful steps, pt without imbalance but with "startle response" stating sudden dizzinessreaching hand from RW to sink for support, education on importance of maintaining hands on RW for safety, cues for upright posture and close RW proximity   Stairs             Wheelchair Mobility     Tilt Bed    Modified Rankin (Stroke Patients Only)       Balance Overall balance assessment: No apparent balance deficits (not formally assessed)                                          Cognition Arousal/Alertness: Awake/alert Behavior During Therapy: WFL for tasks assessed/performed Overall Cognitive Status: Within Functional Limits for tasks assessed                                 General Comments: x1 startle? reflex during ambualtion with pt stating sudden onset of dizziness removing UE support from RW and reaching for support  of sink,        Exercises General Exercises - Lower Extremity Ankle Circles/Pumps: AROM, Both, 10 reps (emcouraged pt to perform throughout day) Long Arc Quad: Strengthening, Right, Left, 10 reps, Seated (with 2 second hold at end range) Hip Flexion/Marching: AROM, Right, Left, 10 reps, Seated    General Comments        Pertinent Vitals/Pain Pain Assessment Pain Assessment: Faces Faces Pain Scale: Hurts a little bit Pain Location: headache Pain Descriptors / Indicators: Aching, Headache Pain Intervention(s): Monitored during session, Limited  activity within patient's tolerance, Repositioned    Home Living                          Prior Function            PT Goals (current goals can now be found in the care plan section) Acute Rehab PT Goals Patient Stated Goal: stop feeling dizzy PT Goal Formulation: With patient Time For Goal Achievement: 12/10/22 Progress towards PT goals: Progressing toward goals    Frequency    Min 1X/week      PT Plan      Co-evaluation              AM-PAC PT "6 Clicks" Mobility   Outcome Measure  Help needed turning from your back to your side while in a flat bed without using bedrails?: None Help needed moving from lying on your back to sitting on the side of a flat bed without using bedrails?: A Little Help needed moving to and from a bed to a chair (including a wheelchair)?: A Little Help needed standing up from a chair using your arms (e.g., wheelchair or bedside chair)?: A Little Help needed to walk in hospital room?: A Little Help needed climbing 3-5 steps with a railing? : Total 6 Click Score: 17    End of Session   Activity Tolerance: Patient tolerated treatment well Patient left: with call bell/phone within reach;in chair Nurse Communication: Mobility status PT Visit Diagnosis: Dizziness and giddiness (R42)     Time: 1610-9604 PT Time Calculation (min) (ACUTE ONLY): 13 min  Charges:    $Therapeutic Exercise: 8-22 mins PT General Charges $$ ACUTE PT VISIT: 1 Visit                     Tobi Bastos R. PTA Acute Rehabilitation Services Office: [phone]   Catalina Antigua 11/29/2022, 2:55 PM

## 2022-11-29 NOTE — Discharge Instructions (Signed)
Information on my medicine - ELIQUIS (apixaban)  This medication education was reviewed with me or my healthcare representative as part of my discharge preparation.    Why was Eliquis prescribed for you? Eliquis was prescribed to treat blood clots that may have been found in the veins of your legs (deep vein thrombosis) or in your lungs (pulmonary embolism) and to reduce the risk of them occurring again.  What do You need to know about Eliquis ? Continue Eliquis 5 mg tablet taken TWICE daily.  Eliquis may be taken with or without food.   Try to take the dose about the same time in the morning and in the evening. If you have difficulty swallowing the tablet whole please discuss with your pharmacist how to take the medication safely.  Take Eliquis exactly as prescribed and DO NOT stop taking Eliquis without talking to the doctor who prescribed the medication.  Stopping may increase your risk of developing a new blood clot.  Refill your prescription before you run out.  After discharge, you should have regular check-up appointments with your healthcare provider that is prescribing your Eliquis.    What do you do if you miss a dose? If a dose of ELIQUIS is not taken at the scheduled time, take it as soon as possible on the same day and twice-daily administration should be resumed. The dose should not be doubled to make up for a missed dose.  Important Safety Information A possible side effect of Eliquis is bleeding. You should call your healthcare provider right away if you experience any of the following: Bleeding from an injury or your nose that does not stop. Unusual colored urine (red or dark brown) or unusual colored stools (red or black). Unusual bruising for unknown reasons. A serious fall or if you hit your head (even if there is no bleeding).  Some medicines may interact with Eliquis and might increase your risk of bleeding or clotting while on Eliquis. To help avoid this,  consult your healthcare provider or pharmacist prior to using any new prescription or non-prescription medications, including herbals, vitamins, non-steroidal anti-inflammatory drugs (NSAIDs) and supplements.  This website has more information on Eliquis (apixaban): http://www.eliquis.com/eliquis/home     Follow with Primary MD Kirstie Peri, MD in 7 days   Get CBC, CMP, anemia panel, TSH, 2 view Chest X ray -  checked next visit with your primary MD    Activity: As tolerated with Full fall precautions use walker/cane & assistance as needed  Disposition Home    Diet: Heart Healthy    Special Instructions: If you have smoked or chewed Tobacco  in the last 2 yrs please stop smoking, stop any regular Alcohol  and or any Recreational drug use.  On your next visit with your primary care physician please Get Medicines reviewed and adjusted.  Please request your Prim.MD to go over all Hospital Tests and Procedure/Radiological results at the follow up, please get all Hospital records sent to your Prim MD by signing hospital release before you go home.  If you experience worsening of your admission symptoms, develop shortness of breath, life threatening emergency, suicidal or homicidal thoughts you must seek medical attention immediately by calling 911 or calling your MD immediately  if symptoms less severe.  You Must read complete instructions/literature along with all the possible adverse reactions/side effects for all the Medicines you take and that have been prescribed to you. Take any new Medicines after you have completely understood and accpet all the

## 2022-11-29 NOTE — TOC Progression Note (Signed)
Transition of Care [facility]) - Progression Note    Patient Details  Name: [name] MRN: [id number] Date of Birth: [birth year]  Transition of Care [facility]) CM/SW Contact  Eduard Roux, [place] Phone Number: 11/29/2022, 4:17 PM  Clinical Narrative:     [name] has received insurance authorization for SNF- patient has home C-pap. SNF can admit tomorrow.   Antony Blackbird, MSW, LCSW Clinical Social Worker    Expected Discharge Plan: Skilled Nursing Facility Barriers to Discharge: Continued Medical Work up  Expected Discharge Plan and Services   Discharge Planning Services: CM Consult Post Acute Care Choice: Skilled Nursing Facility Living arrangements for the past 2 months: Apartment                                       Social Determinants of Health (SDOH) Interventions SDOH Screenings   Food Insecurity: No Food Insecurity (11/26/2022)  Housing: Low Risk  (11/26/2022)  Transportation Needs: No Transportation Needs (11/26/2022)  Utilities: Not At Risk (11/26/2022)  Alcohol Screen: Low Risk  (09/02/2022)  Depression (PHQ2-9): Low Risk  (09/05/2022)  Financial Resource Strain: Low Risk  (09/02/2022)  Physical Activity: Insufficiently Active (09/02/2022)  Social Connections: Moderately Isolated (09/02/2022)  Stress: No Stress Concern Present (09/02/2022)  Tobacco Use: Medium Risk (11/25/2022)    Readmission Risk Interventions    11/28/2022   10:29 AM  Readmission Risk Prevention Plan  Post Dischage Appt Complete  Medication Screening Complete  Transportation Screening Complete

## 2022-11-29 NOTE — Progress Notes (Signed)
PROGRESS NOTE    [name]  FAO:[id number] DOB: 10-29-1970 DOA: 11/25/2022 PCP: Marcine Matar, MD   Brief Narrative:   HPI: [name] is a 52 y.o. female with medical history significant of HFpEF, COPD, CAD, DM, HTN, morbid obesity, and OSA presenting with vertigo. She reports 1 day of head spinning, dizziness.  Upon admission CT of the head and MRI brain were negative.  Routine metabolic workup overall was negative as well.   Assessment & Plan:  Principal Problem:   Vertigo Active Problems:   Hyperlipidemia with target LDL less than 100   Chronic anemia   Essential hypertension   (HFpEF) heart failure with preserved ejection fraction (HCC) -> although echo suggests normal diastolic parameters with normal left atrial size   CAD (coronary artery disease)   History of DVT (deep vein thrombosis)   Depression   OSA (obstructive sleep apnea)   Type 2 diabetes mellitus with morbid obesity (HCC)   Orthostatic dizziness     Vertigo -Workup for stroke has been negative for acute stroke including CT head and MRI brain.  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  TSH, random cortisol negative.  Advised nursing staff to obtain orthostatics and document in the computer.  Still feels dizzy despite of IV fluids.  BP overall stable.  Will consult neurology for their input  Family confirms that patient does have slight facial asymmetry especially on the left side of the face including forehead.  Also mother confirms that she at times stutters with her speech.  All of these issues have been chronic for her.   Anemia/thrombocytopenia Follows outpatient hematology.  Platelets stable   Hypokalemia Replete as needed   HFpEF/CAD -04/2021 echo with normal EF and normal diastolic parameters Will hold diuretics.  Repeat echo shows stable ef.    COPD As needed bronchodilators   Chronic pain -Continue gabapentin, methocarbamol   DM A1c 5.5, metformin on hold.   Currently on sliding scale and Accu-Cheks   Chronic DVT Status post DC filter.  Continue Eliquis   HTN Will do continue Coreg due to borderline low blood pressure and dizziness.  Continue IV as needed medications   HLD -Continue atorvastatin   Mood d/o -Continue paroxetine, hydralazine   Morbid obesity -Body mass index is 49.25 kg/m.. -Has lost significant weight on Ozempic (holding while inpatient) -Weight loss should be encouraged on an ongoing basis -Outpatient PCP/bariatric medicine/bariatric surgery f/u encouraged    OSA CPAP      DVT prophylaxis: Eliquis Code Status: Full code Family Communication:   Still feel dizzy, neurology to evaluate this patient       Diet Orders (From admission, onward)     Start     Ordered   11/28/22 1659  Diet regular Room service appropriate? Yes; Fluid consistency: Thin  Diet effective now       Question Answer Comment  Room service appropriate? Yes   Fluid consistency: Thin      11/28/22 1658            Subjective: Still feeling dizzy at rest and with getting up despite of getting IV fluids, meclizine and reducing her Coreg. Examination:  Constitutional: Not in acute distress Respiratory: Clear to auscultation bilaterally Cardiovascular: Normal sinus rhythm, no rubs Abdomen: Nontender nondistended good bowel sounds Musculoskeletal: No edema noted Skin: No rashes seen Neurologic: CN 2-12 grossly intact.  Chronic mild left-sided facial asymmetry confirmed by patient's mom Psychiatric: Normal judgment and insight. Alert and oriented x  3. Normal mood.  Objective: Vitals:   11/28/22 2141 11/28/22 2254 11/29/22 0434 11/29/22 0802  BP:   103/70 106/75  Pulse:  93 88 91  Resp:   17   Temp:   (!) 97.4 F (36.3 C) 97.9 F (36.6 C)  TempSrc:   Oral   SpO2: 99%  100% 100%  Weight:      Height:        Intake/Output Summary (Last 24 hours) at 11/29/2022 1156 Last data filed at 11/29/2022 0500 Gross per 24 hour   Intake 123 ml  Output 1 ml  Net 122 ml   Filed Weights   11/26/22 0327  Weight: 126.1 kg    Scheduled Meds:  apixaban  5 mg Oral BID   atorvastatin  40 mg Oral Daily   docusate sodium  100 mg Oral BID   ferrous sulfate  325 mg Oral Q breakfast   gabapentin  100 mg Oral TID   insulin aspart  0-15 Units Subcutaneous TID WC   insulin aspart  0-5 Units Subcutaneous QHS   mometasone-formoterol  2 puff Inhalation BID   PARoxetine  20 mg Oral Daily   sodium chloride flush  3 mL Intravenous Q12H   Continuous Infusions:    Nutritional status     Body mass index is 49.25 kg/m.  Data Reviewed:   CBC: Recent Labs  Lab 11/25/22 1524 11/26/22 0541 11/27/22 0006 11/27/22 2325 11/29/22 0353  WBC 5.6 5.9 4.5 5.6 5.5  NEUTROABS  --  4.5  --   --   --   HGB 11.3* 9.7* 9.9* 9.9* 9.2*  HCT 36.9 33.1* 33.1* 34.6* 31.6*  MCV 90.7 93.0 93.0 95.1 92.9  PLT 117* 99* 111* 116* 110*   Basic Metabolic Panel: Recent Labs  Lab 11/25/22 1524 11/26/22 0541 11/27/22 0006 11/27/22 2325 11/29/22 0353  NA 139 139 136 136 133*  K 3.5 3.0* 3.5 3.8 3.6  CL 102 103 103 102 99  CO2 28 25 25 23 25   GLUCOSE 94 102* 116* 126* 90  BUN 11 10 13 16 11   CREATININE 0.77 0.84 0.94 0.84 0.76  CALCIUM 9.9 9.0 8.7* 8.9 8.7*  MG  --  1.8  --  1.6* 1.9   GFR: Estimated Creatinine Clearance: 103.9 mL/min (by C-G formula based on SCr of 0.76 mg/dL). Liver Function Tests: Recent Labs  Lab 11/25/22 1524 11/26/22 0541  AST 10* 12*  ALT <5 8  ALKPHOS 65 54  BILITOT 0.9 0.7  PROT 7.9 6.5  ALBUMIN 4.1 2.9*   No results for input(s): "LIPASE", "AMYLASE" in the last 168 hours. No results for input(s): "AMMONIA" in the last 168 hours. Coagulation Profile: No results for input(s): "INR", "PROTIME" in the last 168 hours. Cardiac Enzymes: No results for input(s): "CKTOTAL", "CKMB", "CKMBINDEX", "TROPONINI" in the last 168 hours. BNP (last 3 results) No results for input(s): "PROBNP" in the last  8760 hours. HbA1C: No results for input(s): "HGBA1C" in the last 72 hours. CBG: Recent Labs  Lab 11/28/22 1231 11/28/22 1743 11/28/22 2151 11/29/22 0803 11/29/22 1150  GLUCAP 113* 111* 109* 123* 101*   Lipid Profile: No results for input(s): "CHOL", "HDL", "LDLCALC", "TRIG", "CHOLHDL", "LDLDIRECT" in the last 72 hours. Thyroid Function Tests: Recent Labs    11/27/22 0854  TSH 0.834   Anemia Panel: No results for input(s): "VITAMINB12", "FOLATE", "FERRITIN", "TIBC", "IRON", "RETICCTPCT" in the last 72 hours. Sepsis Labs: No results for input(s): "PROCALCITON", "LATICACIDVEN" in the last 168 hours.  Recent Results (from the past 240 hour(s))  Resp panel by RT-PCR (RSV, Flu A&B, Covid) Anterior Nasal Swab     Status: None   Collection Time: 11/25/22  7:29 PM   Specimen: Anterior Nasal Swab  Result Value Ref Range Status   SARS Coronavirus 2 by RT PCR NEGATIVE NEGATIVE Final    Comment: (NOTE) SARS-CoV-2 target nucleic acids are NOT DETECTED.  The SARS-CoV-2 RNA is generally detectable in upper respiratory specimens during the acute phase of infection. The lowest concentration of SARS-CoV-2 viral copies this assay can detect is 138 copies/mL. A negative result does not preclude SARS-Cov-2 infection and should not be used as the sole basis for treatment or other patient management decisions. A negative result may occur with  improper specimen collection/handling, submission of specimen other than nasopharyngeal swab, presence of viral mutation(s) within the areas targeted by this assay, and inadequate number of viral copies(<138 copies/mL). A negative result must be combined with clinical observations, patient history, and epidemiological information. The expected result is Negative.  Fact Sheet for Patients:  BloggerCourse.com  Fact Sheet for Healthcare Providers:  SeriousBroker.it  This test is no t yet approved or  cleared by the [place] FDA and  has been authorized for detection and/or diagnosis of SARS-CoV-2 by FDA under an Emergency Use Authorization (EUA). This EUA will remain  in effect (meaning this test can be used) for the duration of the COVID-19 declaration under Section 564(b)(1) of the Act, 21 U.S.C.section 360bbb-3(b)(1), unless the authorization is terminated  or revoked sooner.       Influenza A by PCR NEGATIVE NEGATIVE Final   Influenza B by PCR NEGATIVE NEGATIVE Final    Comment: (NOTE) The Xpert Xpress SARS-CoV-2/FLU/RSV plus assay is intended as an aid in the diagnosis of influenza from Nasopharyngeal swab specimens and should not be used as a sole basis for treatment. Nasal washings and aspirates are unacceptable for Xpert Xpress SARS-CoV-2/FLU/RSV testing.  Fact Sheet for Patients: BloggerCourse.com  Fact Sheet for Healthcare Providers: SeriousBroker.it  This test is not yet approved or cleared by the [place] FDA and has been authorized for detection and/or diagnosis of SARS-CoV-2 by FDA under an Emergency Use Authorization (EUA). This EUA will remain in effect (meaning this test can be used) for the duration of the COVID-19 declaration under Section 564(b)(1) of the Act, 21 U.S.C. section 360bbb-3(b)(1), unless the authorization is terminated or revoked.     Resp Syncytial Virus by PCR NEGATIVE NEGATIVE Final    Comment: (NOTE) Fact Sheet for Patients: BloggerCourse.com  Fact Sheet for Healthcare Providers: SeriousBroker.it  This test is not yet approved or cleared by the [place] FDA and has been authorized for detection and/or diagnosis of SARS-CoV-2 by FDA under an Emergency Use Authorization (EUA). This EUA will remain in effect (meaning this test can be used) for the duration of the COVID-19 declaration under Section 564(b)(1) of the Act, 21  U.S.C. section 360bbb-3(b)(1), unless the authorization is terminated or revoked.  Performed at [facility], [address], [place], [place] [address]          Radiology Studies: ECHOCARDIOGRAM COMPLETE  Result Date: 11/27/2022    ECHOCARDIOGRAM REPORT   Patient Name:   [name] Date of Exam: 11/27/2022 Medical Rec #:  [id number]               Height:       63.0 in Accession #:    [id number]  Weight:       278.0 lb Date of Birth:  Jun 27, 1970                BSA:          2.224 m Patient Age:    52 years                BP:           87/64 mmHg Patient Gender: F                       HR:           104 bpm. Exam Location:  Inpatient Procedure: 2D Echo, Cardiac Doppler, Color Doppler and Intracardiac            Opacification Agent Indications:    R55 Syncope  History:        Patient has prior history of Echocardiogram examinations, most                 recent 05/26/2021. CAD, COPD; Risk Factors:Diabetes and                 Hypertension. HFpEF.  Sonographer:    Harriette Bouillon RDCS Referring Phys: [phone]  CHIRAG  IMPRESSIONS  1. Left ventricular ejection fraction, by estimation, is 60 to 65%. The left ventricle has normal function. The left ventricle has no regional wall motion abnormalities. Left ventricular diastolic parameters are indeterminate.  2. Right ventricular systolic function is normal. The right ventricular size is normal.  3. The mitral valve is normal in structure. No evidence of mitral valve regurgitation. No evidence of mitral stenosis.  4. The aortic valve is normal in structure. Aortic valve regurgitation is not visualized. No aortic stenosis is present.  5. The inferior vena cava is normal in size with greater than 50% respiratory variability, suggesting right atrial pressure of 3 mmHg. Comparison(s): Prior images reviewed side by side. FINDINGS  Left Ventricle: Left ventricular ejection fraction, by estimation, is 60 to 65%. The left  ventricle has normal function. The left ventricle has no regional wall motion abnormalities. The left ventricular internal cavity size was normal in size. There is  no left ventricular hypertrophy. Left ventricular diastolic parameters are indeterminate. Right Ventricle: The right ventricular size is normal. No increase in right ventricular wall thickness. Right ventricular systolic function is normal. Left Atrium: Left atrial size was normal in size. Right Atrium: Right atrial size was normal in size. Pericardium: There is no evidence of pericardial effusion. Mitral Valve: The mitral valve is normal in structure. No evidence of mitral valve regurgitation. No evidence of mitral valve stenosis. Tricuspid Valve: The tricuspid valve is normal in structure. Tricuspid valve regurgitation is not demonstrated. No evidence of tricuspid stenosis. Aortic Valve: The aortic valve is normal in structure. Aortic valve regurgitation is not visualized. No aortic stenosis is present. Pulmonic Valve: The pulmonic valve was normal in structure. Pulmonic valve regurgitation is not visualized. No evidence of pulmonic stenosis. Aorta: The aortic root is normal in size and structure. Venous: The inferior vena cava is normal in size with greater than 50% respiratory variability, suggesting right atrial pressure of 3 mmHg. IAS/Shunts: No atrial level shunt detected by color flow Doppler.  LEFT VENTRICLE PLAX 2D LVIDd:         4.70 cm LVIDs:         3.40 cm LV PW:         1.10 cm LV IVS:  1.10 cm LVOT diam:     2.10 cm LV SV:         45 LV SV Index:   20 LVOT Area:     3.46 cm  LEFT ATRIUM           Index LA diam:      3.30 cm 1.48 cm/m LA Vol (A4C): 38.7 ml 17.40 ml/m  AORTIC VALVE LVOT Vmax:   76.40 cm/s LVOT Vmean:  58.200 cm/s LVOT VTI:    0.131 m  AORTA Ao Root diam: 2.90 cm Ao Asc diam:  3.00 cm  SHUNTS Systemic VTI:  0.13 m Systemic Diam: 2.10 cm Donato Schultz MD Electronically signed by Donato Schultz MD Signature Date/Time:  11/27/2022/2:22:18 PM    Final            LOS: 2 days   Time spent= 35 mins     Joline Maxcy, MD [facility]  If 7PM-7AM, please contact night-coverage  11/29/2022, 11:56 AM

## 2022-11-30 DIAGNOSIS — R42 Dizziness and giddiness: Secondary | ICD-10-CM | POA: Diagnosis not present

## 2022-11-30 LAB — CBC
HCT: 31.4 % — ABNORMAL LOW (ref 36.0–46.0)
Hemoglobin: 9.4 g/dL — ABNORMAL LOW (ref 12.0–15.0)
MCH: 27.8 pg (ref 26.0–34.0)
MCHC: 29.9 g/dL — ABNORMAL LOW (ref 30.0–36.0)
MCV: 92.9 fL (ref 80.0–100.0)
Platelets: 116 K/uL — ABNORMAL LOW (ref 150–400)
RBC: 3.38 MIL/uL — ABNORMAL LOW (ref 3.87–5.11)
RDW: 14.3 % (ref 11.5–15.5)
WBC: 5.7 K/uL (ref 4.0–10.5)
nRBC: 0 % (ref 0.0–0.2)

## 2022-11-30 LAB — GLUCOSE, CAPILLARY: Glucose-Capillary: 140 mg/dL — ABNORMAL HIGH (ref 70–99)

## 2022-11-30 LAB — BASIC METABOLIC PANEL WITH GFR
Anion gap: 7 (ref 5–15)
BUN: 10 mg/dL (ref 6–20)
CO2: 27 mmol/L (ref 22–32)
Calcium: 9.1 mg/dL (ref 8.9–10.3)
Chloride: 102 mmol/L (ref 98–111)
Creatinine, Ser: 0.8 mg/dL (ref 0.44–1.00)
GFR, Estimated: >60 mL/min
Glucose, Bld: 149 mg/dL — ABNORMAL HIGH (ref 70–99)
Potassium: 4.2 mmol/L (ref 3.5–5.1)
Sodium: 136 mmol/L (ref 135–145)

## 2022-11-30 LAB — MAGNESIUM: Magnesium: 1.8 mg/dL (ref 1.7–2.4)

## 2022-11-30 MED ORDER — BUTALBITAL-APAP-CAFFEINE 50-325-40 MG PO TABS: 1.0000 | ORAL_TABLET | Freq: Four times a day (QID) | ORAL | 0 refills | Status: DC | PRN

## 2022-11-30 MED ORDER — OXYCODONE HCL 5 MG PO TABS: 5.0000 mg | ORAL_TABLET | Freq: Four times a day (QID) | ORAL | 0 refills | Status: DC | PRN

## 2022-11-30 MED ORDER — MECLIZINE HCL 25 MG PO TABS: 25.0000 mg | ORAL_TABLET | Freq: Three times a day (TID) | ORAL | Status: DC | PRN

## 2022-11-30 MED ORDER — DOCUSATE SODIUM 100 MG PO CAPS: 100.0000 mg | ORAL_CAPSULE | Freq: Two times a day (BID) | ORAL | Status: DC | PRN

## 2022-11-30 MED ORDER — PREDNISONE 20 MG PO TABS: 40.0000 mg | ORAL_TABLET | Freq: Every day | ORAL | Status: AC

## 2022-11-30 MED ORDER — DIAZEPAM 2 MG PO TABS: 2.0000 mg | ORAL_TABLET | Freq: Three times a day (TID) | ORAL | 0 refills | Status: DC | PRN

## 2022-11-30 NOTE — Plan of Care (Signed)

## 2022-11-30 NOTE — TOC Transition Note (Signed)
Transition of Care [facility]) - CM/SW Discharge Note   Patient Details  Name: [name] MRN: [id number] Date of Birth: [birth year]  [facility]) CM/SW Contact:  Deatra Robinson, LCSW Phone Number: 11/30/2022, 11:48 AM   Clinical Narrative: Pt for dc to [facility] today. Spoke to L[place] at [facility] who confirmed they are prepared to admit pt to room 142. Pt aware of dc and reports agreeable. RN provided with number for report and PTAR arranged for transport. Pt's home CPAP at bedside, RN aware unit will need to go with pt to SNF. SW signing off at dc.   Dellie Burns, MSW, LCSW [phone] (coverage)        Final next level of care: Skilled Nursing Facility Barriers to Discharge: No Barriers Identified   Patient Goals and CMS Choice CMS Medicare.gov Compare Post Acute Care list provided to:: Patient Choice offered to / list presented to : Patient  Discharge Placement                Patient chooses bed at: Other - please specify in the comment section below: [facility] room [address]) Patient to be transferred to facility by: PTAR Name of family member notified: Pt to update family Patient and family notified of of transfer: 11/30/22  Discharge Plan and Services Additional resources added to the After Visit Summary for     Discharge Planning Services: CM Consult Post Acute Care Choice: Skilled Nursing Facility                               Social Determinants of Health (SDOH) Interventions SDOH Screenings   Food Insecurity: No Food Insecurity (11/26/2022)  Housing: Low Risk  (11/26/2022)  Transportation Needs: No Transportation Needs (11/26/2022)  Utilities: Not At Risk (11/26/2022)  Alcohol Screen: Low Risk  (09/02/2022)  Depression (PHQ2-9): Low Risk  (09/05/2022)  Financial Resource Strain: Low Risk  (09/02/2022)  Physical Activity: Insufficiently Active (09/02/2022)  Social Connections: Moderately Isolated (09/02/2022)  Stress: No Stress  Concern Present (09/02/2022)  Tobacco Use: Medium Risk (11/25/2022)     Readmission Risk Interventions    11/28/2022   10:29 AM  Readmission Risk Prevention Plan  Post Dischage Appt Complete  Medication Screening Complete  Transportation Screening Complete

## 2022-11-30 NOTE — Progress Notes (Signed)
   11/29/22 2330  BiPAP/CPAP/SIPAP  Reason BIPAP/CPAP not in use Non-compliant (pt refuse)

## 2022-11-30 NOTE — Progress Notes (Signed)
Tried to contact facility to call the report. But no operator was answered. Just kept a voice message.

## 2022-11-30 NOTE — Plan of Care (Signed)

## 2022-11-30 NOTE — Discharge Summary (Signed)
Physician Discharge Summary  [name] NWG:[id number] DOB: [birth year] DOA: 11/25/2022  PCP: Marcine Matar, MD  Admit date: 11/25/2022 Discharge date: 11/30/2022  Admitted From: Home Disposition:  SNF  Recommendations for Outpatient Follow-up:  Follow up with PCP in 1-2 weeks Please obtain BMP/CBC in one week your next doctors visit.  Valium prn short course for dizziness, Prednisone PO for 4 more days. Meclizine prn F/u Outpatient with ENT and get Audiometry testing done.  Stopped Norvasc, lasix, Aldactone, Robaxin and Coreg Slowly resume BP meds as her BP and dizziness improves with mobility and therapy.  Vestibular therapy from PT   Discharge Condition: Stable CODE STATUS: Full Diet recommendation:   Brief/Interim Summary: cardiac   HPI: [name] is a 52 y.o. female with medical history significant of HFpEF, COPD, CAD, DM, HTN, morbid obesity, and OSA presenting with vertigo. She reports 1 day of head spinning, dizziness.  Upon admission CT of the head and MRI brain were negative.  Routine metabolic workup overall was negative as well. Seen by Neuro, work up is negative. Discussed with Dr Jearld Fenton from ENT, who recommends outaptient follow up since Ear exam is very non specific and MRI Brain showed normal middle ear without effusion or infection.  Rest of the changes as above.      Assessment & Plan:  Principal Problem:   Vertigo Active Problems:   Hyperlipidemia with target LDL less than 100   Chronic anemia   Essential hypertension   (HFpEF) heart failure with preserved ejection fraction (HCC) -> although echo suggests normal diastolic parameters with normal left atrial size   CAD (coronary artery disease)   History of DVT (deep vein thrombosis)   Depression   OSA (obstructive sleep apnea)   Type 2 diabetes mellitus with morbid obesity (HCC)   Orthostatic dizziness      Vertigo -Workup for stroke has been negative for acute stroke  including CT head and MRI brain.  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  TSH, random cortisol negative.  Advised nursing staff to obtain orthostatics and document in the computer.  Still feels dizzy despite of IV fluids.  BP overall stable.  Seen by Neuro and discussed case with ENT. Recommend outpatient follow up with ENT.  Valium and meclizine prn, Prednisone PO for 4 more day.    Family confirms that patient does have slight facial asymmetry especially on the left side of the face including forehead.  Also mother confirms that she at times stutters with her speech.  All of these issues have been chronic for her.   Anemia/thrombocytopenia Follows outpatient hematology.  Platelets stable   Hypokalemia Replete as needed   HFpEF/CAD -04/2021 echo with normal EF and normal diastolic parameters Will hold diuretics.  Repeat echo shows stable ef.  Hold home cardiac meds for now, slowly resume when appropriate.    COPD As needed bronchodilators   Chronic pain -Continue gabapentin, Hold robaxin for now, can resume when ok.    DM A1c 5.5, metformin on hold.  Currently on sliding scale and Accu-Cheks   Chronic DVT Status post DC filter.  Continue Eliquis   HTN Holding home PO meds for now, slowly resume as her BP and dizziness improved.    HLD -Continue atorvastatin   Mood d/o -Continue paroxetine, hydraxazine.    Morbid obesity -Body mass index is 49.25 kg/m.. -Has lost significant weight on Ozempic (holding while inpatient) -Weight loss should be encouraged on an ongoing basis -Outpatient PCP/bariatric medicine/bariatric surgery  f/u encouraged    OSA CPAP      Discharge Diagnoses:  Principal Problem:   Vertigo Active Problems:   Hyperlipidemia with target LDL less than 100   Chronic anemia   Essential hypertension   (HFpEF) heart failure with preserved ejection fraction (HCC) -> although echo suggests normal diastolic parameters with normal left atrial size    CAD (coronary artery disease)   History of DVT (deep vein thrombosis)   Depression   OSA (obstructive sleep apnea)   Type 2 diabetes mellitus with morbid obesity (HCC)   Orthostatic dizziness      Consultations: Neuro Cursided ENT, Dr Jearld Fenton  Subjective: Feels ok, still has some dizziness at times.   Discharge Exam: Vitals:   11/30/22 0742 11/30/22 0848  BP: 103/78   Pulse: 86   Resp: 18   Temp: 98 F (36.7 C)   SpO2: 100% 100%   Vitals:   11/30/22 0130 11/30/22 0518 11/30/22 0742 11/30/22 0848  BP: 118/75 131/89 103/78   Pulse: 83 90 86   Resp:  18 18   Temp: 97.8 F (36.6 C) 97.8 F (36.6 C) 98 F (36.7 C)   TempSrc: Oral Oral Oral   SpO2: 100% 100% 100% 100%  Weight:      Height:        General: Pt is alert, awake, not in acute distress Cardiovascular: RRR, S1/S2 +, no rubs, no gallops Respiratory: CTA bilaterally, no wheezing, no rhonchi Abdominal: Soft, NT, ND, bowel sounds + Extremities: no edema, no cyanosis  Discharge Instructions   Allergies as of 11/30/2022       Reactions   Ace Inhibitors Rash, Other (See Comments)   Make pt bleed   Aspirin Other (See Comments)   Per patient paperwork: blood clot?  Likely because of chronic DOAC   Hydromorphone Hives, Itching   Vancomycin Itching, Rash   Contrast Media [iodinated Contrast Media] Hives   Dilaudid [hydromorphone Hcl] Hives   Lidocaine Itching   Itching with Lidocaine patch reported 06/13/2021 vis telephone message.        Medication List     STOP taking these medications    amLODipine 5 MG tablet Commonly known as: NORVASC   carvedilol 25 MG tablet Commonly known as: COREG   furosemide 80 MG tablet Commonly known as: LASIX   methocarbamol 500 MG tablet Commonly known as: ROBAXIN   spironolactone 25 MG tablet Commonly known as: ALDACTONE       TAKE these medications    acetaminophen 500 MG tablet Commonly known as: TYLENOL Take 1 tablet (500 mg total) by mouth every 6  (six) hours as needed.   atorvastatin 40 MG tablet Commonly known as: LIPITOR Take 1 tablet (40 mg total) by mouth daily.   butalbital-acetaminophen-caffeine 50-325-40 MG tablet Commonly known as: FIORICET Take 1 tablet by mouth every 6 (six) hours as needed for headache.   cholecalciferol 25 MCG (1000 UNIT) tablet Commonly known as: VITAMIN D3 Take 1 tablet (1,000 Units total) by mouth daily.   cyanocobalamin 1000 MCG tablet Commonly known as: VITAMIN B12 Take 1,000 mcg by mouth daily.   diazepam 2 MG tablet Commonly known as: VALIUM Take 1 tablet (2 mg total) by mouth every 8 (eight) hours as needed (dizziness).   diclofenac Sodium 1 % Gel Commonly known as: Voltaren Apply 2 g topically 4 (four) times daily.   docusate sodium 100 MG capsule Commonly known as: COLACE Take 1 capsule (100 mg total) by mouth 2 (two) times  daily as needed for mild constipation.   Dulera 200-5 MCG/ACT Aero Generic drug: mometasone-formoterol INHALE 2 PUFFS EVERY MORNING AND 2 PUFFS EVERY NIGHT AT BEDTIME What changed: See the new instructions.   Eliquis 5 MG Tabs tablet Generic drug: apixaban TAKE 1 TABLET BY MOUTH TWICE A DAY   Farxiga 10 MG Tabs tablet Generic drug: dapagliflozin propanediol TAKE 1 TABLET BY MOUTH DAILY   FeroSul 325 (65 Fe) MG tablet Generic drug: ferrous sulfate TAKE ONE TABLET BY MOUTH DAILY WITH BREAKFAST   gabapentin 100 MG capsule Commonly known as: NEURONTIN TAKE ONE CAPSULE BY MOUTH THREE TIMES A DAY   hydrocortisone cream 1 % Apply 1 application topically daily as needed for itching.   hydrOXYzine 25 MG capsule Commonly known as: VISTARIL Take 1 capsule (25 mg total) by mouth at bedtime as needed. What changed: reasons to take this   meclizine 25 MG tablet Commonly known as: ANTIVERT Take 1 tablet (25 mg total) by mouth 3 (three) times daily as needed for dizziness.   nitroGLYCERIN 0.4 MG SL tablet Commonly known as: NITROSTAT Place 1 tablet  (0.4 mg total) under the tongue every 5 (five) minutes as needed for chest pain.   oxyCODONE 5 MG immediate release tablet Commonly known as: Oxy IR/ROXICODONE Take 1 tablet (5 mg total) by mouth every 6 (six) hours as needed for severe pain.   PARoxetine 20 MG tablet Commonly known as: PAXIL TAKE 1 TABLET BY MOUTH DAILY   predniSONE 20 MG tablet Commonly known as: DELTASONE Take 2 tablets (40 mg total) by mouth daily with breakfast for 4 days. Start taking on: December 01, 2022   Semaglutide (1 MG/DOSE) 4 MG/3ML Sopn Inject 1 mg as directed once a week.        Contact information for follow-up providers     Marcine Matar, MD Follow up in 1 week(s).   Specialty: Internal Medicine Contact information: [address] [place] [address] [place] [address] [phone]              Contact information for after-discharge care     Destination     [facility] .   Service: Skilled Nursing Contact information: [address] [place] [address] [phone]                    Allergies  Allergen Reactions   Ace Inhibitors Rash and Other (See Comments)    Make pt bleed   Aspirin Other (See Comments)    Per patient paperwork: blood clot?  Likely because of chronic DOAC   Hydromorphone Hives and Itching   Vancomycin Itching and Rash   Contrast Media [Iodinated Contrast Media] Hives   Dilaudid [Hydromorphone Hcl] Hives   Lidocaine Itching    Itching with Lidocaine patch reported 06/13/2021 vis telephone message.    You were cared for by a hospitalist during your hospital stay. If you have any questions about your discharge medications or the care you received while you were in the hospital after you are discharged, you can call the unit and asked to speak with the hospitalist on call if the hospitalist that took care of you is not available. Once you are discharged, your primary care physician will handle any further medical issues.  Please note that no refills for any discharge medications will be authorized once you are discharged, as it is imperative that you return to your primary care physician (or establish a relationship with a primary care  physician if you do not have one) for your aftercare needs so that they can reassess your need for medications and monitor your lab values.  You were cared for by a hospitalist during your hospital stay. If you have any questions about your discharge medications or the care you received while you were in the hospital after you are discharged, you can call the unit and asked to speak with the hospitalist on call if the hospitalist that took care of you is not available. Once you are discharged, your primary care physician will handle any further medical issues. Please note that NO REFILLS for any discharge medications will be authorized once you are discharged, as it is imperative that you return to your primary care physician (or establish a relationship with a primary care physician if you do not have one) for your aftercare needs so that they can reassess your need for medications and monitor your lab values.  Please request your Prim.MD to go over all Hospital Tests and Procedure/Radiological results at the follow up, please get all Hospital records sent to your Prim MD by signing hospital release before you go home.  Get CBC, CMP, 2 view Chest X ray checked  by Primary MD during your next visit or SNF MD in 5-7 days ( we routinely change or add medications that can affect your baseline labs and fluid status, therefore we recommend that you get the mentioned basic workup next visit with your PCP, your PCP may decide not to get them or add new tests based on their clinical decision)  On your next visit with your primary care physician please Get Medicines reviewed and adjusted.  If you experience worsening of your admission symptoms, develop shortness of breath, life threatening emergency,  suicidal or homicidal thoughts you must seek medical attention immediately by calling 911 or calling your MD immediately  if symptoms less severe.  You Must read complete instructions/literature along with all the possible adverse reactions/side effects for all the Medicines you take and that have been prescribed to you. Take any new Medicines after you have completely understood and accpet all the possible adverse reactions/side effects.   Do not drive, operate heavy machinery, perform activities at heights, swimming or participation in water activities or provide baby sitting services if your were admitted for syncope or siezures until you have seen by Primary MD or a Neurologist and advised to do so again.  Do not drive when taking Pain medications.   Procedures/Studies: ECHOCARDIOGRAM COMPLETE  Result Date: 11/27/2022    ECHOCARDIOGRAM REPORT   Patient Name:   LEIGHANN AMADON Date of Exam: 11/27/2022 Medical Rec #:  [id number]               Height:       63.0 in Accession #:    [id number]              Weight:       278.0 lb Date of Birth:  [birth year]                BSA:          2.224 m Patient Age:    52 years                BP:           87/64 mmHg Patient Gender: F                       HR:  104 bpm. Exam Location:  Inpatient Procedure: 2D Echo, Cardiac Doppler, Color Doppler and Intracardiac            Opacification Agent Indications:    R55 Syncope  History:        Patient has prior history of Echocardiogram examinations, most                 recent 05/26/2021. CAD, COPD; Risk Factors:Diabetes and                 Hypertension. HFpEF.  Sonographer:    Harriette Bouillon RDCS Referring Phys: [phone]  CHIRAG  IMPRESSIONS  1. Left ventricular ejection fraction, by estimation, is 60 to 65%. The left ventricle has normal function. The left ventricle has no regional wall motion abnormalities. Left ventricular diastolic parameters are indeterminate.  2. Right ventricular systolic  function is normal. The right ventricular size is normal.  3. The mitral valve is normal in structure. No evidence of mitral valve regurgitation. No evidence of mitral stenosis.  4. The aortic valve is normal in structure. Aortic valve regurgitation is not visualized. No aortic stenosis is present.  5. The inferior vena cava is normal in size with greater than 50% respiratory variability, suggesting right atrial pressure of 3 mmHg. Comparison(s): Prior images reviewed side by side. FINDINGS  Left Ventricle: Left ventricular ejection fraction, by estimation, is 60 to 65%. The left ventricle has normal function. The left ventricle has no regional wall motion abnormalities. The left ventricular internal cavity size was normal in size. There is  no left ventricular hypertrophy. Left ventricular diastolic parameters are indeterminate. Right Ventricle: The right ventricular size is normal. No increase in right ventricular wall thickness. Right ventricular systolic function is normal. Left Atrium: Left atrial size was normal in size. Right Atrium: Right atrial size was normal in size. Pericardium: There is no evidence of pericardial effusion. Mitral Valve: The mitral valve is normal in structure. No evidence of mitral valve regurgitation. No evidence of mitral valve stenosis. Tricuspid Valve: The tricuspid valve is normal in structure. Tricuspid valve regurgitation is not demonstrated. No evidence of tricuspid stenosis. Aortic Valve: The aortic valve is normal in structure. Aortic valve regurgitation is not visualized. No aortic stenosis is present. Pulmonic Valve: The pulmonic valve was normal in structure. Pulmonic valve regurgitation is not visualized. No evidence of pulmonic stenosis. Aorta: The aortic root is normal in size and structure. Venous: The inferior vena cava is normal in size with greater than 50% respiratory variability, suggesting right atrial pressure of 3 mmHg. IAS/Shunts: No atrial level shunt detected  by color flow Doppler.  LEFT VENTRICLE PLAX 2D LVIDd:         4.70 cm LVIDs:         3.40 cm LV PW:         1.10 cm LV IVS:        1.10 cm LVOT diam:     2.10 cm LV SV:         45 LV SV Index:   20 LVOT Area:     3.46 cm  LEFT ATRIUM           Index LA diam:      3.30 cm 1.48 cm/m LA Vol (A4C): 38.7 ml 17.40 ml/m  AORTIC VALVE LVOT Vmax:   76.40 cm/s LVOT Vmean:  58.200 cm/s LVOT VTI:    0.131 m  AORTA Ao Root diam: 2.90 cm Ao Asc diam:  3.00 cm  SHUNTS Systemic  VTI:  0.13 m Systemic Diam: 2.10 cm Donato Schultz MD Electronically signed by Donato Schultz MD Signature Date/Time: 11/27/2022/2:22:18 PM    Final    MR BRAIN WO CONTRAST  Result Date: 11/26/2022 CLINICAL DATA:  Acute neurologic deficit EXAM: MRI HEAD WITHOUT CONTRAST TECHNIQUE: Multiplanar, multiecho pulse sequences of the brain and surrounding structures were obtained without intravenous contrast. COMPARISON:  None Available. FINDINGS: Brain: No acute infarct, mass effect or extra-axial collection. No acute or chronic hemorrhage. 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 Otherwise normal white matter signal, parenchymal volume and CSF spaces. The midline structures are normal. Vascular: Major flow voids are preserved. Skull and upper cervical spine: Normal calvarium and skull base. Visualized upper cervical spine and soft tissues are normal. Sinuses/Orbits:No paranasal sinus fluid levels or advanced mucosal thickening. No mastoid or middle ear effusion. Normal orbits. IMPRESSION: 1. No acute intracranial abnormality. 2. 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 Electronically Signed   By: Deatra Robinson M.D.   On: 11/26/2022 00:53   CT HEAD WO CONTRAST  Result Date: 11/25/2022 CLINICAL DATA:  Sudden onset headache and dizziness. Fatigue for 2 weeks. EXAM: CT HEAD WITHOUT CONTRAST TECHNIQUE: Contiguous axial images were obtained from the base of the skull through the vertex without intravenous contrast. RADIATION DOSE REDUCTION: This exam was performed  according to the departmental dose-optimization program which includes automated exposure control, adjustment of the mA and/or kV according to patient size and/or use of iterative reconstruction technique. COMPARISON:  09/21/2020 FINDINGS: Brain: There is no evidence for acute hemorrhage, hydrocephalus, mass lesion, or abnormal extra-axial fluid collection. No definite CT evidence for acute infarction. Stable small left frontal infarct. Vascular: No hyperdense vessel or unexpected calcification. Skull: No evidence for fracture. No worrisome lytic or sclerotic lesion. Sinuses/Orbits: The visualized paranasal sinuses and mastoid air cells are clear. Visualized portions of the globes and intraorbital fat are unremarkable. Other: None. IMPRESSION: 1. No acute intracranial abnormality. 2. Stable small left frontal infarct. Electronically Signed   By: Kennith Center M.D.   On: 11/25/2022 17:22   MM 3D SCREENING MAMMOGRAM BILATERAL BREAST  Result Date: 11/05/2022 CLINICAL DATA:  Screening. EXAM: DIGITAL SCREENING BILATERAL MAMMOGRAM WITH TOMOSYNTHESIS AND CAD TECHNIQUE: Bilateral screening digital craniocaudal and mediolateral oblique mammograms were obtained. Bilateral screening digital breast tomosynthesis was performed. The images were evaluated with computer-aided detection. COMPARISON:  Previous exam(s). ACR Breast Density Category a: The breasts are almost entirely fatty. FINDINGS: There are no findings suspicious for malignancy. IMPRESSION: No mammographic evidence of malignancy. A result letter of this screening mammogram will be mailed directly to the patient. RECOMMENDATION: Screening mammogram in one year. (Code:[id number]-B-01Y) BI-RADS CATEGORY  1: Negative. Electronically Signed   By: Amie Portland M.D.   On: 11/05/2022 13:03     The results of significant diagnostics from this hospitalization (including imaging, microbiology, ancillary and laboratory) are listed below for reference.     Microbiology: Recent  Results (from the past 240 hour(s))  Resp panel by RT-PCR (RSV, Flu A&B, Covid) Anterior Nasal Swab     Status: None   Collection Time: 11/25/22  7:29 PM   Specimen: Anterior Nasal Swab  Result Value Ref Range Status   SARS Coronavirus 2 by RT PCR NEGATIVE NEGATIVE Final    Comment: (NOTE) SARS-CoV-2 target nucleic acids are NOT DETECTED.  The SARS-CoV-2 RNA is generally detectable in upper respiratory specimens during the acute phase of infection. The lowest concentration of SARS-CoV-2 viral copies this assay can detect is 138 copies/mL. A negative result does  not preclude SARS-Cov-2 infection and should not be used as the sole basis for treatment or other patient management decisions. A negative result may occur with  improper specimen collection/handling, submission of specimen other than nasopharyngeal swab, presence of viral mutation(s) within the areas targeted by this assay, and inadequate number of viral copies(<138 copies/mL). A negative result must be combined with clinical observations, patient history, and epidemiological information. The expected result is Negative.  Fact Sheet for Patients:  BloggerCourse.com  Fact Sheet for Healthcare Providers:  SeriousBroker.it  This test is no t yet approved or cleared by the [place] FDA and  has been authorized for detection and/or diagnosis of SARS-CoV-2 by FDA under an Emergency Use Authorization (EUA). This EUA will remain  in effect (meaning this test can be used) for the duration of the COVID-19 declaration under Section 564(b)(1) of the Act, 21 U.S.C.section 360bbb-3(b)(1), unless the authorization is terminated  or revoked sooner.       Influenza A by PCR NEGATIVE NEGATIVE Final   Influenza B by PCR NEGATIVE NEGATIVE Final    Comment: (NOTE) The Xpert Xpress SARS-CoV-2/FLU/RSV plus assay is intended as an aid in the diagnosis of influenza from Nasopharyngeal  swab specimens and should not be used as a sole basis for treatment. Nasal washings and aspirates are unacceptable for Xpert Xpress SARS-CoV-2/FLU/RSV testing.  Fact Sheet for Patients: BloggerCourse.com  Fact Sheet for Healthcare Providers: SeriousBroker.it  This test is not yet approved or cleared by the [place] FDA and has been authorized for detection and/or diagnosis of SARS-CoV-2 by FDA under an Emergency Use Authorization (EUA). This EUA will remain in effect (meaning this test can be used) for the duration of the COVID-19 declaration under Section 564(b)(1) of the Act, 21 U.S.C. section 360bbb-3(b)(1), unless the authorization is terminated or revoked.     Resp Syncytial Virus by PCR NEGATIVE NEGATIVE Final    Comment: (NOTE) Fact Sheet for Patients: BloggerCourse.com  Fact Sheet for Healthcare Providers: SeriousBroker.it  This test is not yet approved or cleared by the [place] FDA and has been authorized for detection and/or diagnosis of SARS-CoV-2 by FDA under an Emergency Use Authorization (EUA). This EUA will remain in effect (meaning this test can be used) for the duration of the COVID-19 declaration under Section 564(b)(1) of the Act, 21 U.S.C. section 360bbb-3(b)(1), unless the authorization is terminated or revoked.  Performed at [facility], [address], [place], [place] [address]      Labs: BNP (last 3 results) Recent Labs    02/07/22 1348 03/05/22 1618  BNP 65.1 68.2   Basic Metabolic Panel: Recent Labs  Lab 11/26/22 0541 11/27/22 0006 11/27/22 2325 11/29/22 0353 11/30/22 0337  NA 139 136 136 133* 136  K 3.0* 3.5 3.8 3.6 4.2  CL 103 103 102 99 102  CO2 25 25 23 25 27   GLUCOSE 102* 116* 126* 90 149*  BUN 10 13 16 11 10   CREATININE 0.84 0.94 0.84 0.76 0.80  CALCIUM 9.0 8.7* 8.9 8.7* 9.1  MG 1.8  --  1.6*  1.9 1.8   Liver Function Tests: Recent Labs  Lab 11/25/22 1524 11/26/22 0541  AST 10* 12*  ALT <5 8  ALKPHOS 65 54  BILITOT 0.9 0.7  PROT 7.9 6.5  ALBUMIN 4.1 2.9*   No results for input(s): "LIPASE", "AMYLASE" in the last 168 hours. No results for input(s): "AMMONIA" in the last 168 hours. CBC: Recent Labs  Lab 11/26/22 0541 11/27/22 0006 11/27/22  2325 11/29/22 0353 11/30/22 0337  WBC 5.9 4.5 5.6 5.5 5.7  NEUTROABS 4.5  --   --   --   --   HGB 9.7* 9.9* 9.9* 9.2* 9.4*  HCT 33.1* 33.1* 34.6* 31.6* 31.4*  MCV 93.0 93.0 95.1 92.9 92.9  PLT 99* 111* 116* 110* 116*   Cardiac Enzymes: No results for input(s): "CKTOTAL", "CKMB", "CKMBINDEX", "TROPONINI" in the last 168 hours. BNP: Invalid input(s): "POCBNP" CBG: Recent Labs  Lab 11/29/22 0803 11/29/22 1150 11/29/22 1554 11/29/22 2056 11/30/22 0743  GLUCAP 123* 101* 97 156* 140*   D-Dimer No results for input(s): "DDIMER" in the last 72 hours. Hgb A1c No results for input(s): "HGBA1C" in the last 72 hours. Lipid Profile No results for input(s): "CHOL", "HDL", "LDLCALC", "TRIG", "CHOLHDL", "LDLDIRECT" in the last 72 hours. Thyroid function studies No results for input(s): "TSH", "T4TOTAL", "T3FREE", "THYROIDAB" in the last 72 hours.  Invalid input(s): "FREET3" Anemia work up No results for input(s): "VITAMINB12", "FOLATE", "FERRITIN", "TIBC", "IRON", "RETICCTPCT" in the last 72 hours. Urinalysis    Component Value Date/Time   COLORURINE COLORLESS (A) 03/05/2022 1859   APPEARANCEUR CLEAR 03/05/2022 1859   APPEARANCEUR Clear 01/03/2022 1531   LABSPEC 1.005 03/05/2022 1859   PHURINE 6.5 03/05/2022 1859   GLUCOSEU NEGATIVE 03/05/2022 1859   HGBUR NEGATIVE 03/05/2022 1859   BILIRUBINUR NEGATIVE 03/05/2022 1859   BILIRUBINUR Negative 01/03/2022 1531   BILIRUBINUR negative 01/03/2022 1510   KETONESUR NEGATIVE 03/05/2022 1859   PROTEINUR NEGATIVE 03/05/2022 1859   UROBILINOGEN 2.0 (A) 01/03/2022 1510   NITRITE  NEGATIVE 03/05/2022 1859   LEUKOCYTESUR NEGATIVE 03/05/2022 1859   Sepsis Labs Recent Labs  Lab 11/27/22 0006 11/27/22 2325 11/29/22 0353 11/30/22 0337  WBC 4.5 5.6 5.5 5.7   Microbiology Recent Results (from the past 240 hour(s))  Resp panel by RT-PCR (RSV, Flu A&B, Covid) Anterior Nasal Swab     Status: None   Collection Time: 11/25/22  7:29 PM   Specimen: Anterior Nasal Swab  Result Value Ref Range Status   SARS Coronavirus 2 by RT PCR NEGATIVE NEGATIVE Final    Comment: (NOTE) SARS-CoV-2 target nucleic acids are NOT DETECTED.  The SARS-CoV-2 RNA is generally detectable in upper respiratory specimens during the acute phase of infection. The lowest concentration of SARS-CoV-2 viral copies this assay can detect is 138 copies/mL. A negative result does not preclude SARS-Cov-2 infection and should not be used as the sole basis for treatment or other patient management decisions. A negative result may occur with  improper specimen collection/handling, submission of specimen other than nasopharyngeal swab, presence of viral mutation(s) within the areas targeted by this assay, and inadequate number of viral copies(<138 copies/mL). A negative result must be combined with clinical observations, patient history, and epidemiological information. The expected result is Negative.  Fact Sheet for Patients:  BloggerCourse.com  Fact Sheet for Healthcare Providers:  SeriousBroker.it  This test is no t yet approved or cleared by the [place] FDA and  has been authorized for detection and/or diagnosis of SARS-CoV-2 by FDA under an Emergency Use Authorization (EUA). This EUA will remain  in effect (meaning this test can be used) for the duration of the COVID-19 declaration under Section 564(b)(1) of the Act, 21 U.S.C.section 360bbb-3(b)(1), unless the authorization is terminated  or revoked sooner.       Influenza A by PCR  NEGATIVE NEGATIVE Final   Influenza B by PCR NEGATIVE NEGATIVE Final    Comment: (NOTE) The Xpert Xpress SARS-CoV-2/FLU/RSV plus  assay is intended as an aid in the diagnosis of influenza from Nasopharyngeal swab specimens and should not be used as a sole basis for treatment. Nasal washings and aspirates are unacceptable for Xpert Xpress SARS-CoV-2/FLU/RSV testing.  Fact Sheet for Patients: BloggerCourse.com  Fact Sheet for Healthcare Providers: SeriousBroker.it  This test is not yet approved or cleared by the [place] FDA and has been authorized for detection and/or diagnosis of SARS-CoV-2 by FDA under an Emergency Use Authorization (EUA). This EUA will remain in effect (meaning this test can be used) for the duration of the COVID-19 declaration under Section 564(b)(1) of the Act, 21 U.S.C. section 360bbb-3(b)(1), unless the authorization is terminated or revoked.     Resp Syncytial Virus by PCR NEGATIVE NEGATIVE Final    Comment: (NOTE) Fact Sheet for Patients: BloggerCourse.com  Fact Sheet for Healthcare Providers: SeriousBroker.it  This test is not yet approved or cleared by the [place] FDA and has been authorized for detection and/or diagnosis of SARS-CoV-2 by FDA under an Emergency Use Authorization (EUA). This EUA will remain in effect (meaning this test can be used) for the duration of the COVID-19 declaration under Section 564(b)(1) of the Act, 21 U.S.C. section 360bbb-3(b)(1), unless the authorization is terminated or revoked.  Performed at [facility], [address], [place], [place] [address]      Time coordinating discharge:  I have spent 35 minutes face to face with the patient and on the ward discussing the patients care, assessment, plan and disposition with other care givers. >50% of the time was devoted counseling the  patient about the risks and benefits of treatment/Discharge disposition and coordinating care.   SIGNED:   Dimple Nanas, MD  [facility] 11/30/2022, 10:18 AM   If 7PM-7AM, please contact night-coverage

## 2022-12-03 ENCOUNTER — Other Ambulatory Visit: Payer: Self-pay

## 2022-12-03 DIAGNOSIS — F32A Depression, unspecified: Secondary | ICD-10-CM | POA: Diagnosis not present

## 2022-12-03 DIAGNOSIS — G4700 Insomnia, unspecified: Secondary | ICD-10-CM | POA: Diagnosis not present

## 2022-12-03 DIAGNOSIS — F419 Anxiety disorder, unspecified: Secondary | ICD-10-CM | POA: Diagnosis not present

## 2022-12-05 ENCOUNTER — Telehealth: Payer: Self-pay | Admitting: *Deleted

## 2022-12-05 ENCOUNTER — Encounter: Payer: Self-pay | Admitting: Pharmacist

## 2022-12-05 ENCOUNTER — Telehealth: Payer: Self-pay | Admitting: Internal Medicine

## 2022-12-05 ENCOUNTER — Other Ambulatory Visit (HOSPITAL_BASED_OUTPATIENT_CLINIC_OR_DEPARTMENT_OTHER): Payer: Commercial Managed Care - HMO | Admitting: Pharmacist

## 2022-12-05 DIAGNOSIS — E1159 Type 2 diabetes mellitus with other circulatory complications: Secondary | ICD-10-CM

## 2022-12-05 DIAGNOSIS — Z7984 Long term (current) use of oral hypoglycemic drugs: Secondary | ICD-10-CM

## 2022-12-05 DIAGNOSIS — I152 Hypertension secondary to endocrine disorders: Secondary | ICD-10-CM

## 2022-12-05 DIAGNOSIS — Z7985 Long-term (current) use of injectable non-insulin antidiabetic drugs: Secondary | ICD-10-CM

## 2022-12-05 NOTE — Telephone Encounter (Signed)
Patient states she had blood drawn this morning - patient is at [facility]. Patient is trying to get those results- she is concerned about her hemoglobin.

## 2022-12-05 NOTE — Progress Notes (Signed)
12/05/2022 Name: [name] MRN: [id number] DOB: [birth year]  No chief complaint on file.   [name] is a 52 y.o. year old female who presented for a telephone visit.   They were referred to the pharmacist by their Case Management Team  for assistance in managing hypertension.    Subjective:  Care Team: Primary Care Provider: Marcine Matar, MD ; Next Scheduled Visit: 01/06/2023  Medication Access/Adherence  Current Pharmacy:  [id number] [place], [place] - [address] [place] [address] Phone: [phone] Fax: [phone]  [facility] [place] [facility] [address] [place] [address] Phone: [phone] Fax: [phone]  Patient reports affordability concerns with their medications: No  Patient reports access/transportation concerns to their pharmacy: No  Patient reports adherence concerns with their medications:  No     Hypertension:  Current medications: currently holding per hospital recommendation Medications previously tried: amlodipine 5 mg, carvedilol 25 mg BID - hospital discontinued these d/t hypotension  Patient is not currently hypotensive but does have a dx of chronic anemia and vertigo.    Objective:  Lab Results  Component Value Date   HGBA1C 5.5 09/05/2022    Lab Results  Component Value Date   CREATININE 0.80 11/30/2022   BUN 10 11/30/2022   NA 136 11/30/2022   K 4.2 11/30/2022   CL 102 11/30/2022   CO2 27 11/30/2022    Lab Results  Component Value Date   CHOL 129 09/05/2022   HDL 49 09/05/2022   LDLCALC 66 09/05/2022   TRIG 70 09/05/2022   CHOLHDL 2.6 09/05/2022    Medications Reviewed Today     Reviewed by Drucilla Chalet, RPH-CPP (Pharmacist) on 12/05/22 at 1713  Med List Status: <None>   Medication Order Taking? Sig Documenting Provider Last Dose Status Informant  acetaminophen (TYLENOL) 500 MG tablet  [id number] No Take 1 tablet (500 mg total) by mouth every 6 (six) hours as needed.  Patient not taking: Reported on 11/26/2022   Marcine Matar, MD Not Taking Active Self           Med Note Mayford Knife, [place] Mar 06, 2022  5:48 PM) Pt states she had taken both strengths yesturday 500 mg and 650  atorvastatin (LIPITOR) 40 MG tablet [id number] No Take 1 tablet (40 mg total) by mouth daily. Marcine Matar, MD 11/25/2022 Active Self  butalbital-acetaminophen-caffeine (FIORICET) 50-325-40 MG tablet [id number]  Take 1 tablet by mouth every 6 (six) hours as needed for headache. Amin, Loura Halt, MD  Active   cholecalciferol (VITAMIN D3) 25 MCG (1000 UNIT) tablet [id number] No Take 1 tablet (1,000 Units total) by mouth daily. Marcine Matar, MD Past Month Active Self  diazepam (VALIUM) 2 MG tablet [id number]  Take 1 tablet (2 mg total) by mouth every 8 (eight) hours as needed (dizziness). Dimple Nanas, MD  Active   diclofenac Sodium (VOLTAREN) 1 % GEL [id number] No Apply 2 g topically 4 (four) times daily. Marcine Matar, MD Past Week Active Self  docusate sodium (COLACE) 100 MG capsule [id number]  Take 1 capsule (100 mg total) by mouth 2 (two) times daily as needed for mild constipation. Dimple Nanas, MD  Active   DULERA 200-5 MCG/ACT AERO [id number] No INHALE 2 PUFFS EVERY MORNING AND 2 PUFFS EVERY NIGHT AT BEDTIME  Patient taking differently: Inhale 2 puffs into the lungs See admin instructions. INHALE 2 PUFFS  EVERY MORNING AND 2 PUFFS EVERY NIGHT AT BEDTIME   Marcine Matar, MD 11/25/2022 Active Self  ELIQUIS 5 MG TABS tablet [id number] No TAKE 1 TABLET BY MOUTH TWICE A DAY Marcine Matar, MD 11/25/2022 Active Self  FARXIGA 10 MG TABS tablet [id number] No TAKE 1 TABLET BY MOUTH DAILY Marcine Matar, MD 11/25/2022 Active Self  FEROSUL 325 (65 Fe) MG tablet [id number] No TAKE ONE TABLET BY MOUTH DAILY WITH Illene Labrador, MD 11/25/2022 Active Self  gabapentin  (NEURONTIN) 100 MG capsule [id number] No TAKE ONE CAPSULE BY MOUTH THREE TIMES A DAY Marcine Matar, MD 11/25/2022 Active Self  hydrocortisone cream 1 % [id number] No Apply 1 application topically daily as needed for itching. [provider] Past Week Active Self  hydrOXYzine (VISTARIL) 25 MG capsule [id number] No Take 1 capsule (25 mg total) by mouth at bedtime as needed.  Patient taking differently: Take 25 mg by mouth at bedtime as needed for anxiety or itching.   Marcine Matar, MD Past Week Active Self  meclizine (ANTIVERT) 25 MG tablet [id number]  Take 1 tablet (25 mg total) by mouth 3 (three) times daily as needed for dizziness. Dimple Nanas, MD  Active   nitroGLYCERIN (NITROSTAT) 0.4 MG SL tablet [id number] No Place 1 tablet (0.4 mg total) under the tongue every 5 (five) minutes as needed for chest pain. Marcine Matar, MD Taking Active Self  oxyCODONE (OXY IR/ROXICODONE) 5 MG immediate release tablet [id number]  Take 1 tablet (5 mg total) by mouth every 6 (six) hours as needed for severe pain. Dimple Nanas, MD  Active   PARoxetine (PAXIL) 20 MG tablet [id number]  TAKE 1 TABLET BY MOUTH DAILY Marcine Matar, MD  Active   predniSONE (DELTASONE) 20 MG tablet [id number]  Take 2 tablets (40 mg total) by mouth daily with breakfast for 4 days. Dimple Nanas, MD  Active   Semaglutide, 1 MG/DOSE, 4 MG/3ML SOPN [id number] No Inject 1 mg as directed once a week. Marcine Matar, MD Past Week Active Self  vitamin B-12 (CYANOCOBALAMIN) 1000 MCG tablet [id number] No Take 1,000 mcg by mouth daily. [provider] Past Month Active Self              Assessment/Plan:   Hypertension: - Currently controlled - Reviewed long term cardiovascular and renal outcomes of uncontrolled blood pressure - Reviewed appropriate blood pressure monitoring technique and reviewed goal blood pressure.  - Recommend to continue holding BP medications unless home BP increases to  >130/80 mmHg consistently. Her PCP will decide if we need to restart antihypertensives next month.      Follow Up Plan: next month w/ PCP/  Butch Penny, PharmD, [place], CPP Clinical Pharmacist [facility] [phone]  ,

## 2022-12-05 NOTE — Telephone Encounter (Signed)
Copied from CRM [id number]. Topic: General - Other >> Dec 05, 2022 11:31 AM Epimenio Foot F wrote: Reason for CRM: Pt is calling in because she would like Luke to give her a call back.

## 2022-12-06 NOTE — Telephone Encounter (Signed)
Call returned to patient. She is concerned about her low hemoglobin. She is currently in a SNF and tells me her SNF drew blood today to check her hgb. I informed her that the staff at the SNF will inform her of the results once they have them. She verbalized understanding.

## 2022-12-06 NOTE — Telephone Encounter (Signed)
Patient called and informed that her hemoglobin results will be made available once her SNF receives them. She is currently in a SNF and the nursing staff there drew her labs.

## 2022-12-09 ENCOUNTER — Telehealth: Payer: Self-pay | Admitting: Internal Medicine

## 2022-12-09 ENCOUNTER — Telehealth: Payer: Self-pay

## 2022-12-09 ENCOUNTER — Ambulatory Visit: Payer: Self-pay

## 2022-12-09 NOTE — Telephone Encounter (Signed)
Copied from CRM [id number]. Topic: General - Other >> Dec 09, 2022  8:10 AM Phill Myron wrote: [name]    Please call I would like to discuss my medication and  my symptoms of dizziness and Head Pain

## 2022-12-09 NOTE — Telephone Encounter (Signed)
Chief Complaint: Dizziness  Symptoms: dizziness and headache Frequency: ongoing  Pertinent Negatives: Patient denies Chest pain, nausea, vomiting, fever Disposition: [] ED /[] Urgent Care (no appt availability in office) / [] Appointment(In office/virtual)/ []  [facility]/ [x] Home Care/ [] Refused Recommended Disposition /[] [facility]/ []  Follow-up with PCP Additional Notes: Patient is currently in in-patient rehab facility. Patient stated that she was seen in the ED for vertigo 2 weeks ago and was sent to in-patient rehab at discharge. Patient reports that she still feels dizzy and the nursing staff is telling her that all her vital signs are normal and blood work. Patient states she feels like she needs a blood transfusion. Advised patient to make nursing staff aware that she is not feeling well so she can be assessed. Patient verbalized understanding. Advised that I would forward message to provider as well.   Summary: Dizziness, severe head pain   Waking up with dizziness , severe head pain  not going away, has been going on for a few weeks.  Hx of Vertigo     Reason for Disposition  [1] MODERATE dizziness (e.g., vertigo; feels very unsteady, interferes with normal activities) AND [2] has been evaluated by doctor (or NP/PA) for this  Answer Assessment - Initial Assessment Questions 1. DESCRIPTION: "Describe your dizziness."     It fells like room is spinning right now 2. VERTIGO: "Do you feel like either you or the room is spinning or tilting?"      Spinning 3. LIGHTHEADED: "Do you feel lightheaded?" (e.g., somewhat faint, woozy, weak upon standing)     Weak upon standing  4. SEVERITY: "How bad is it?"  "Can you walk?"   - MILD: Feels slightly dizzy and unsteady, but is walking normally.   - MODERATE: Feels unsteady when walking, but not falling; interferes with normal activities (e.g., school, work).   - SEVERE: Unable to walk without falling, or requires assistance  to walk without falling.     Requires assistance to walk  5. ONSET:  "When did the dizziness begin?"     Ongoing for 2 weeks  6. AGGRAVATING FACTORS: "Does anything make it worse?" (e.g., standing, change in head position)     When I stand up or change position  7. CAUSE: "What do you think is causing the dizziness?"     Vertigo  8. RECURRENT SYMPTOM: "Have you had dizziness before?" If Yes, ask: "When was the last time?" "What happened that time?"     Yes, 2 weeks ago I went to the hospital and I am at a rehab facility. [facility]  9. OTHER SYMPTOMS: "Do you have any other symptoms?" (e.g., headache, weakness, numbness, vomiting, earache)     Headache, weakness  Protocols used: Dizziness - Vertigo-A-AH

## 2022-12-09 NOTE — Telephone Encounter (Signed)
Call placed to patient unable to reach message left on VM.  Patient also sent Mychart message as she was last active on 12/09/2022  Good Morning,   This ,  Nurse for Dr. Laural Benes  We received your message and I did return your call and was unable to reach you. I left a  Voice mail message to please call , so we could discuss your concerns in more details. Please have the  person that answers your  call to transfer you to  at [facility] and wellness.   Thank you  Elsie Lincoln, RN

## 2022-12-09 NOTE — Telephone Encounter (Signed)
Opened in error

## 2022-12-10 ENCOUNTER — Emergency Department (HOSPITAL_COMMUNITY)
Admission: EM | Admit: 2022-12-10 | Discharge: 2022-12-10 | Disposition: A | Discharge status: Home / Self Care | Payer: Commercial Managed Care - HMO | Attending: Emergency Medicine | Admitting: Emergency Medicine

## 2022-12-10 ENCOUNTER — Emergency Department (HOSPITAL_BASED_OUTPATIENT_CLINIC_OR_DEPARTMENT_OTHER)
Admit: 2022-12-10 | Discharge: 2022-12-10 | Disposition: A | Discharge status: Home / Self Care | Payer: Commercial Managed Care - HMO

## 2022-12-10 ENCOUNTER — Ambulatory Visit: Payer: Self-pay

## 2022-12-10 ENCOUNTER — Other Ambulatory Visit: Payer: Self-pay

## 2022-12-10 ENCOUNTER — Encounter (HOSPITAL_COMMUNITY): Payer: Self-pay

## 2022-12-10 DIAGNOSIS — I509 Heart failure, unspecified: Secondary | ICD-10-CM | POA: Insufficient documentation

## 2022-12-10 DIAGNOSIS — E119 Type 2 diabetes mellitus without complications: Secondary | ICD-10-CM | POA: Insufficient documentation

## 2022-12-10 DIAGNOSIS — M79661 Pain in right lower leg: Secondary | ICD-10-CM | POA: Diagnosis not present

## 2022-12-10 DIAGNOSIS — J449 Chronic obstructive pulmonary disease, unspecified: Secondary | ICD-10-CM | POA: Insufficient documentation

## 2022-12-10 DIAGNOSIS — M79604 Pain in right leg: Secondary | ICD-10-CM | POA: Diagnosis present

## 2022-12-10 DIAGNOSIS — M7989 Other specified soft tissue disorders: Secondary | ICD-10-CM | POA: Diagnosis not present

## 2022-12-10 DIAGNOSIS — L03115 Cellulitis of right lower limb: Secondary | ICD-10-CM

## 2022-12-10 DIAGNOSIS — L03116 Cellulitis of left lower limb: Secondary | ICD-10-CM

## 2022-12-10 DIAGNOSIS — Z7901 Long term (current) use of anticoagulants: Secondary | ICD-10-CM | POA: Insufficient documentation

## 2022-12-10 LAB — CBC
HCT: 36.6 % (ref 36.0–46.0)
Hemoglobin: 10.6 g/dL — ABNORMAL LOW (ref 12.0–15.0)
MCH: 27.5 pg (ref 26.0–34.0)
MCHC: 29 g/dL — ABNORMAL LOW (ref 30.0–36.0)
MCV: 94.8 fL (ref 80.0–100.0)
Platelets: 130 10*3/uL — ABNORMAL LOW (ref 150–400)
RBC: 3.86 MIL/uL — ABNORMAL LOW (ref 3.87–5.11)
RDW: 15.2 % (ref 11.5–15.5)
WBC: 5.8 10*3/uL (ref 4.0–10.5)
nRBC: 0 % (ref 0.0–0.2)

## 2022-12-10 LAB — BASIC METABOLIC PANEL
Anion gap: 13 (ref 5–15)
BUN: 8 mg/dL (ref 6–20)
CO2: 26 mmol/L (ref 22–32)
Calcium: 9.3 mg/dL (ref 8.9–10.3)
Chloride: 100 mmol/L (ref 98–111)
Creatinine, Ser: 0.72 mg/dL (ref 0.44–1.00)
GFR, Estimated: >60 mL/min (ref >60)
Glucose, Bld: 82 mg/dL (ref 70–99)
Potassium: 3.6 mmol/L (ref 3.5–5.1)
Sodium: 139 mmol/L (ref 135–145)

## 2022-12-10 MED ORDER — MECLIZINE HCL 25 MG PO TABS
25.0000 mg | ORAL_TABLET | Freq: Once | ORAL | Status: AC
  Administered 2022-12-10: 25 mg via ORAL
  Filled 2022-12-10: qty 1

## 2022-12-10 MED ORDER — CEPHALEXIN 250 MG PO CAPS
500.0000 mg | ORAL_CAPSULE | Freq: Once | ORAL | Status: AC
  Administered 2022-12-10: 500 mg via ORAL
  Filled 2022-12-10: qty 2

## 2022-12-10 MED ORDER — CEPHALEXIN 500 MG PO CAPS: 500.0000 mg | ORAL_CAPSULE | Freq: Two times a day (BID) | ORAL | 0 refills | Status: AC

## 2022-12-10 MED ORDER — ONDANSETRON 4 MG PO TBDP
8.0000 mg | ORAL_TABLET | Freq: Once | ORAL | Status: AC
  Administered 2022-12-10: 8 mg via ORAL
  Filled 2022-12-10: qty 2

## 2022-12-10 NOTE — ED Triage Notes (Signed)
[facility] reports pt coming from [facility] assisted living. 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

## 2022-12-10 NOTE — Progress Notes (Signed)
Bilateral lower extremity venous duplex has been completed. Preliminary results can be found in CV Proc through chart review.  Results were given to Maxwell Marion PA.  12/10/22 6:01 PM Olen Cordial RVT

## 2022-12-10 NOTE — ED Notes (Signed)
Vascular in with PT

## 2022-12-10 NOTE — ED Notes (Signed)
Placed the second call to [facility] with no answer. [phone]

## 2022-12-10 NOTE — ED Notes (Addendum)
Consulted phlebotomy for labs because 2 different nurses attempted to get blood with no success.Fleet Contras the phlebotomist said she will grab it.

## 2022-12-10 NOTE — ED Notes (Signed)
Called [facility] 3rd time with no answer.PTAR informed and PT was transported with proper paperwork

## 2022-12-10 NOTE — Telephone Encounter (Signed)
Patient in ED.

## 2022-12-10 NOTE — Discharge Instructions (Addendum)
As discussed, your symptoms are most likely related to cellulitis. Your imaging has ruled out blood clots in the lower legs. I have sent a prescription of Keflex to your pharmacy. Take this medication twice a day for the next 7 days. It will take about 2 days for the antibiotic to take effect. Please complete entirety of medication to ensure resolution of infection.  Follow up with your PCP in 3 days for reevaluation of your symptoms.  Take Tylenol 500 mg every 4-6 hours as needed for pain relief.    Get help right away if: You notice red streaks coming from the infected area. You notice the skin turns purple or black and falls off.

## 2022-12-10 NOTE — ED Provider Notes (Signed)
[place] EMERGENCY DEPARTMENT AT [facility] Provider Note   CSN: [id number] Arrival date & time: 12/10/22  1640     History  Chief Complaint  Patient presents with   Leg Swelling    [name] is a 52 y.o. female with a history of heart failure, COPD, DVT, and diabetes mellitus who presents to the ED today for leg pain.  Patient reports that her right calf has felt hot and swollen since this morning.  She reports that it is difficult to ambulate on secondary to the pain.  Prior to arrival at the ED, patient began to feel swelling and warmth to the left calf as well.  She denies any fevers, chills, recent injury or abrasion to the lower legs. 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  She denies missing or skipping any doses of the medication.  No new or worsening chest pain or shortness of breath.   Additionally, patient complains of vertigo with associated nausea and vomiting.  Patient reports that she was seen in the ED for this same concern a couple weeks ago but she still has been having the intermittent dizziness since.  It has not worsened since the last time she was evaluated.  No weakness, headaches, vision changes, or slurred speech.  No other concerns or complaints at this time.    Home Medications Prior to Admission medications   Medication Sig Start Date End Date Taking? Authorizing Provider  cephALEXin (KEFLEX) 500 MG capsule Take 1 capsule (500 mg total) by mouth 2 (two) times daily for 7 days. 12/10/22 12/17/22 Yes Maxwell Marion, PA-C  acetaminophen (TYLENOL) 500 MG tablet Take 1 tablet (500 mg total) by mouth every 6 (six) hours as needed. Patient not taking: Reported on 11/26/2022 04/19/20   Marcine Matar, MD  atorvastatin (LIPITOR) 40 MG tablet Take 1 tablet (40 mg total) by mouth daily. 11/22/21   Marcine Matar, MD  butalbital-acetaminophen-caffeine (FIORICET) [phone] MG tablet Take 1 tablet by mouth every 6 (six) hours as  needed for headache. 11/30/22   Amin, Loura Halt, MD  cholecalciferol (VITAMIN D3) 25 MCG (1000 UNIT) tablet Take 1 tablet (1,000 Units total) by mouth daily. 04/07/20   Marcine Matar, MD  diazepam (VALIUM) 2 MG tablet Take 1 tablet (2 mg total) by mouth every 8 (eight) hours as needed (dizziness). 11/30/22   Amin, Loura Halt, MD  diclofenac Sodium (VOLTAREN) 1 % GEL Apply 2 g topically 4 (four) times daily. 05/07/22   Marcine Matar, MD  docusate sodium (COLACE) 100 MG capsule Take 1 capsule (100 mg total) by mouth 2 (two) times daily as needed for mild constipation. 11/30/22   Amin, Ankit Chirag, MD  DULERA 200-5 MCG/ACT AERO INHALE 2 PUFFS EVERY MORNING AND 2 PUFFS EVERY NIGHT AT BEDTIME Patient taking differently: Inhale 2 puffs into the lungs See admin instructions. INHALE 2 PUFFS EVERY MORNING AND 2 PUFFS EVERY NIGHT AT BEDTIME 06/04/22   Marcine Matar, MD  ELIQUIS 5 MG TABS tablet TAKE 1 TABLET BY MOUTH TWICE A DAY 07/02/22   Marcine Matar, MD  FARXIGA 10 MG TABS tablet TAKE 1 TABLET BY MOUTH DAILY 02/11/22   Marcine Matar, MD  FEROSUL 325 (65 Fe) MG tablet TAKE ONE TABLET BY MOUTH DAILY WITH BREAKFAST 06/06/21   Marcine Matar, MD  gabapentin (NEURONTIN) 100 MG capsule TAKE ONE CAPSULE BY MOUTH THREE TIMES A DAY 09/04/22   Marcine Matar, MD  hydrocortisone cream 1 % Apply 1 application topically daily as needed for itching.    [provider]  hydrOXYzine (VISTARIL) 25 MG capsule Take 1 capsule (25 mg total) by mouth at bedtime as needed. Patient taking differently: Take 25 mg by mouth at bedtime as needed for anxiety or itching. 04/20/21   Marcine Matar, MD  meclizine (ANTIVERT) 25 MG tablet Take 1 tablet (25 mg total) by mouth 3 (three) times daily as needed for dizziness. 11/30/22   Amin, Loura Halt, MD  nitroGLYCERIN (NITROSTAT) 0.4 MG SL tablet Place 1 tablet (0.4 mg total) under the tongue every 5 (five) minutes as needed for chest pain. 11/22/21    Marcine Matar, MD  oxyCODONE (OXY IR/ROXICODONE) 5 MG immediate release tablet Take 1 tablet (5 mg total) by mouth every 6 (six) hours as needed for severe pain. 11/30/22   Amin, Loura Halt, MD  PARoxetine (PAXIL) 20 MG tablet TAKE 1 TABLET BY MOUTH DAILY 11/29/22   Marcine Matar, MD  Semaglutide, 1 MG/DOSE, 4 MG/3ML SOPN Inject 1 mg as directed once a week. 11/22/22   Marcine Matar, MD  vitamin B-12 (CYANOCOBALAMIN) 1000 MCG tablet Take 1,000 mcg by mouth daily.    [provider]      Allergies    Ace inhibitors, Aspirin, Hydromorphone, Vancomycin, Contrast media [iodinated contrast media], Dilaudid [hydromorphone hcl], and Lidocaine    Review of Systems   Review of Systems  Cardiovascular:  Positive for leg swelling.  All other systems reviewed and are negative.   Physical Exam Updated Vital Signs BP 121/76   Pulse 92   Temp 97.7 F (36.5 C) (Oral)   Resp 16   Ht 5\' 3"  (1.6 m)   Wt 126.1 kg   SpO2 100%   BMI 49.25 kg/m  Physical Exam Vitals and nursing note reviewed.  Constitutional:      General: She is not in acute distress.    Appearance: Normal appearance.  HENT:     Head: Normocephalic and atraumatic.     Mouth/Throat:     Mouth: Mucous membranes are moist.  Eyes:     Conjunctiva/sclera: Conjunctivae normal.     Pupils: Pupils are equal, round, and reactive to light.  Cardiovascular:     Rate and Rhythm: Normal rate and regular rhythm.     Pulses: Normal pulses.     Heart sounds: Normal heart sounds.  Pulmonary:     Effort: Pulmonary effort is normal.     Breath sounds: Normal breath sounds.  Abdominal:     Palpations: Abdomen is soft.     Tenderness: There is no abdominal tenderness.  Musculoskeletal:        General: Tenderness present.     Right lower leg: Edema present.     Left lower leg: Edema present.     Comments: Bilateral lower extremity edema with warmth to touch and tenderness to the posterior calf  Skin:    General: Skin  is warm and dry.     Findings: No rash.  Neurological:     General: No focal deficit present.     Mental Status: She is alert.  Psychiatric:        Mood and Affect: Mood normal.        Behavior: Behavior normal.     ED Results / Procedures / Treatments   Labs (all labs ordered are listed, but only abnormal results are displayed) Labs Reviewed  CBC - Abnormal; Notable for the  following components:      Result Value   RBC 3.86 (*)    Hemoglobin 10.6 (*)    MCHC 29.0 (*)    Platelets 130 (*)    All other components within normal limits  BASIC METABOLIC PANEL    EKG None  Radiology VAS [place] LOWER EXTREMITY VENOUS (DVT) (ONLY [facility] & [facility])  Result Date: 12/10/2022  Lower Venous DVT Study Patient Name:  SENNA PECH  Date of Exam:   12/10/2022 Medical Rec #: [id number]                Accession #:    [id number] Date of Birth: 1971/04/13                 Patient Gender: F Patient Age:   10 years Exam Location:  [facility] Procedure:      VAS [place] LOWER EXTREMITY VENOUS (DVT) Referring Phys: Maxwell Marion --------------------------------------------------------------------------------  Indications: Pain.  Risk Factors: None identified. Limitations: Body habitus, poor ultrasound/tissue interface and patient pain tolerance. Comparison Study: No prior studies. Performing Technologist: Chanda Busing RVT  Examination Guidelines: A complete evaluation includes B-mode imaging, spectral Doppler, color Doppler, and power Doppler as needed of all accessible portions of each vessel. Bilateral testing is considered an integral part of a complete examination. Limited examinations for reoccurring indications may be performed as noted. The reflux portion of the exam is performed with the patient in reverse Trendelenburg.  +---------+---------------+---------+-----------+----------+--------------+ RIGHT    CompressibilityPhasicitySpontaneityPropertiesThrombus Aging  +---------+---------------+---------+-----------+----------+--------------+ CFV      Full           Yes      Yes                                 +---------+---------------+---------+-----------+----------+--------------+ SFJ      Full                                                        +---------+---------------+---------+-----------+----------+--------------+ FV Prox  Full                                                        +---------+---------------+---------+-----------+----------+--------------+ FV Mid                  Yes      Yes                                 +---------+---------------+---------+-----------+----------+--------------+ FV Distal               Yes      Yes                                 +---------+---------------+---------+-----------+----------+--------------+ PFV      Full                                                        +---------+---------------+---------+-----------+----------+--------------+  POP      Full           Yes      Yes                                 +---------+---------------+---------+-----------+----------+--------------+ PTV      Full                                                        +---------+---------------+---------+-----------+----------+--------------+ PERO     Full                                                        +---------+---------------+---------+-----------+----------+--------------+   +---------+---------------+---------+-----------+----------+--------------+ LEFT     CompressibilityPhasicitySpontaneityPropertiesThrombus Aging +---------+---------------+---------+-----------+----------+--------------+ CFV      Full           Yes      Yes                                 +---------+---------------+---------+-----------+----------+--------------+ SFJ      Full                                                         +---------+---------------+---------+-----------+----------+--------------+ FV Prox  Full                                                        +---------+---------------+---------+-----------+----------+--------------+ FV Mid                  Yes      Yes                                 +---------+---------------+---------+-----------+----------+--------------+ FV Distal               Yes      Yes                                 +---------+---------------+---------+-----------+----------+--------------+ PFV      Full                                                        +---------+---------------+---------+-----------+----------+--------------+ POP      Full           Yes      Yes                                 +---------+---------------+---------+-----------+----------+--------------+  PTV      Full                                                        +---------+---------------+---------+-----------+----------+--------------+ PERO     Full                                                        +---------+---------------+---------+-----------+----------+--------------+    Summary: RIGHT: - There is no evidence of deep vein thrombosis in the lower extremity. However, portions of this examination were limited- see technologist comments above.  - No cystic structure found in the popliteal fossa.  LEFT: - There is no evidence of deep vein thrombosis in the lower extremity. However, portions of this examination were limited- see technologist comments above.  - No cystic structure found in the popliteal fossa.  *See table(s) above for measurements and observations.    Preliminary     Procedures Procedures: not indicated.   Medications Ordered in ED Medications  cephALEXin (KEFLEX) capsule 500 mg (has no administration in time range)  meclizine (ANTIVERT) tablet 25 mg (25 mg Oral Given 12/10/22 1705)  ondansetron (ZOFRAN-ODT) disintegrating tablet 8 mg (8 mg  Oral Given 12/10/22 1706)    ED Course/ Medical Decision Making/ A&P                                 Medical Decision Making Amount and/or Complexity of Data Reviewed Labs: ordered.  Risk Prescription drug management.   This patient presents to the ED for concern of leg swelling and pain, this involves an extensive number of treatment options, and is a complaint that carries with it a high risk of complications and morbidity.   Differential diagnosis includes: DVT, cellulitis, erysipelas, venous insufficiency, peripheral artery disease, etc.   Comorbidities  See HPI above   Additional History  Additional history obtained from patient's previous records.   Lab Tests  I ordered and personally interpreted labs.  The pertinent results include:   CBC is within normal limits - no acute anemia or infection. BMP is unremarkable - no electrolyte abnormality or AKI.   Imaging Studies  I ordered imaging studies including bilateral lower extremity ultrasound - DVT study I independently visualized and interpreted imaging which showed: Negative for DVT in left or right lower extremity. I agree with the radiologist interpretation   Problem List / ED Course / Critical Interventions / Medication Management  Bilateral lower extremity pain, warmth, and swelling I ordered medications including: Meclizine and Zofran for vertigo  Keflex for cellulitis Reevaluation of the patient after these medicines showed that the patient improved I have reviewed the patients home medicines and have made adjustments as needed   Social Determinants of Health  Physical activity   Test / Admission - Considered  Discussed results with patient. All questions were answered. Patient is hemodynamically stable, vitals are within normal limits. She is safe for discharge home. Prescription for Keflex was sent to the pharmacy. Return precautions provided.        Final Clinical Impression(s) / ED  Diagnoses Final diagnoses:  Bilateral lower  extremity edema    Rx / DC Orders ED Discharge Orders          Ordered    cephALEXin (KEFLEX) 500 MG capsule  2 times daily        12/10/22 2018              Maxwell Marion, PA-C 12/10/22 2130    Cathren Laine, MD 12/10/22 [phone]

## 2022-12-10 NOTE — ED Notes (Signed)
[facility] at [phone]. Will try again.

## 2022-12-10 NOTE — Telephone Encounter (Signed)
Noted  

## 2022-12-10 NOTE — Telephone Encounter (Signed)
Message from Maxatawny T sent at 12/10/2022  1:19 PM EDT  Summary: possible cellulitis   Patient is in rehab but she is experiencing swelling in her right leg and its hot to the touch. Her sugar was 98 and the nurse at the rehab thinks she should get an ultrasound done to check for cellulitis          Chief Complaint: sore to right calf  Symptoms: redness, warm,hand swelling, severe pain that is constant Pertinent Negatives: Patient denies feverchills Disposition: [x] ED /[] Urgent Care (no appt availability in office) / [] Appointment(In office/virtual)/ []  [facility]/ [] Home Care/ [] Refused Recommended Disposition /[] [facility]/ []  Follow-up with PCP Additional Notes: advised ED due to severe pain and probable infection, Pt stated the facilty is going to call Dr Roseanne Reno to come and evaluate wound as well.   Reason for Disposition  SEVERE pain in the wound  Answer Assessment - Initial Assessment Questions 1. LOCATION: "Where is the wound located?"      Right leg  calf  2. WOUND APPEARANCE: "What does the wound look like?"      Swollen warm shiny  3. SIZE: If redness is present, ask: "What is the size of the red area?" (Inches, centimeters, or compare to size of a coin)      Did not respond to  4. SPREAD: "What's changed in the last day?"  "Do you see any red streaks coming from the wound?"     warm  6. MECHANISM: "How did the wound start, what was the cause?"     Unsure  7. PAIN: Do you have any pain?"  If Yes, ask: "How bad is the pain?"  (e.g., Scale 1-10; mild, moderate, or severe)    - MILD (1-3): Doesn't interfere with normal activities.     - MODERATE (4-7): Interferes with normal activities or awakens from sleep.    - SEVERE (8-10): Excruciating pain, unable to do any normal activities.       Severe/constant  8. FEVER: "Do you have a fever?" If Yes, ask: "What is your temperature, how was it measured, and when did it start?"     no 9. OTHER SYMPTOMS:  "Do you have any other symptoms?" (e.g., shaking chills, weakness, rash elsewhere on body) Difficult sleeping,  Protocols used: Wound Infection Suspected-A-AH

## 2022-12-11 ENCOUNTER — Telehealth: Payer: Self-pay

## 2022-12-11 NOTE — Transitions of Care (Post Inpatient/ED Visit) (Signed)
   12/11/2022  Name: [name] MRN: [id number] DOB: January 09, 1971  Today's TOC FU Call Status: Today's TOC FU Call Status:: Unsuccessful Call (1st Attempt) Unsuccessful Call (1st Attempt) Date: 12/11/22  Attempted to reach the patient regarding the most recent Inpatient/ED visit.  Follow Up Plan: Additional outreach attempts will be made to reach the patient to complete the Transitions of Care (Post Inpatient/ED visit) call.   Abelino Derrick, MHA [facility]  Managed [facility] Social Worker [phone]

## 2022-12-12 ENCOUNTER — Other Ambulatory Visit: Payer: Self-pay | Admitting: Internal Medicine

## 2022-12-12 ENCOUNTER — Telehealth: Payer: Self-pay

## 2022-12-12 DIAGNOSIS — E669 Obesity, unspecified: Secondary | ICD-10-CM

## 2022-12-12 DIAGNOSIS — I10 Essential (primary) hypertension: Secondary | ICD-10-CM

## 2022-12-12 DIAGNOSIS — I5032 Chronic diastolic (congestive) heart failure: Secondary | ICD-10-CM

## 2022-12-12 NOTE — Transitions of Care (Post Inpatient/ED Visit) (Signed)
   12/12/2022  Name: [name] MRN: [id number] DOB: [birth year]  Today's TOC FU Call Status: Today's TOC FU Call Status:: Successful TOC FU Call Completed TOC FU Call Complete Date: 12/12/22  Transition Care Management Follow-up Telephone Call How have you been since you were released from the hospital?: Same Any questions or concerns?: No  Items Reviewed: Did you receive and understand the discharge instructions provided?: Yes Medications obtained,verified, and reconciled?: Yes (Medications Reviewed) Any new allergies since your discharge?: No Dietary orders reviewed?: NA Do you have support at home?: Yes (Patient is in inpatient rehab)  Medications Reviewed Today: Medications Reviewed Today   Medications were not reviewed in this encounter     Home Care and Equipment/Supplies: Were Home Health Services Ordered?: NA Any new equipment or medical supplies ordered?: NA  Functional Questionnaire:    Follow up appointments reviewed:      Abelino Derrick, [facility]  Managed [facility] Social Worker [phone]

## 2022-12-16 ENCOUNTER — Ambulatory Visit: Payer: Medicaid Other | Admitting: Dietician

## 2022-12-18 ENCOUNTER — Emergency Department (HOSPITAL_COMMUNITY)
Admission: EM | Admit: 2022-12-18 | Discharge: 2022-12-18 | Disposition: A | Discharge status: Rehabilitation Facility | Payer: Commercial Managed Care - HMO | Attending: Emergency Medicine | Admitting: Emergency Medicine

## 2022-12-18 DIAGNOSIS — R600 Localized edema: Secondary | ICD-10-CM | POA: Diagnosis not present

## 2022-12-18 DIAGNOSIS — M79662 Pain in left lower leg: Secondary | ICD-10-CM | POA: Insufficient documentation

## 2022-12-18 DIAGNOSIS — Z7901 Long term (current) use of anticoagulants: Secondary | ICD-10-CM | POA: Insufficient documentation

## 2022-12-18 DIAGNOSIS — M79661 Pain in right lower leg: Secondary | ICD-10-CM | POA: Insufficient documentation

## 2022-12-18 DIAGNOSIS — M79604 Pain in right leg: Secondary | ICD-10-CM

## 2022-12-18 LAB — CBC WITH DIFFERENTIAL/PLATELET
Abs Immature Granulocytes: 0.01 10*3/uL (ref 0.00–0.07)
Basophils Absolute: 0 10*3/uL (ref 0.0–0.1)
Basophils Relative: 0 %
Eosinophils Absolute: 0.1 10*3/uL (ref 0.0–0.5)
Eosinophils Relative: 2 %
HCT: 33 % — ABNORMAL LOW (ref 36.0–46.0)
Hemoglobin: 9.6 g/dL — ABNORMAL LOW (ref 12.0–15.0)
Immature Granulocytes: 0 %
Lymphocytes Relative: 12 %
Lymphs Abs: 0.5 10*3/uL — ABNORMAL LOW (ref 0.7–4.0)
MCH: 27.7 pg (ref 26.0–34.0)
MCHC: 29.1 g/dL — ABNORMAL LOW (ref 30.0–36.0)
MCV: 95.1 fL (ref 80.0–100.0)
Monocytes Absolute: 0.3 10*3/uL (ref 0.1–1.0)
Monocytes Relative: 8 %
Neutro Abs: 3.2 10*3/uL (ref 1.7–7.7)
Neutrophils Relative %: 78 %
Platelets: 134 10*3/uL — ABNORMAL LOW (ref 150–400)
RBC: 3.47 MIL/uL — ABNORMAL LOW (ref 3.87–5.11)
RDW: 15.4 % (ref 11.5–15.5)
WBC: 4.1 10*3/uL (ref 4.0–10.5)
nRBC: 0 % (ref 0.0–0.2)

## 2022-12-18 LAB — COMPREHENSIVE METABOLIC PANEL
ALT: 10 U/L (ref 0–44)
AST: 13 U/L — ABNORMAL LOW (ref 15–41)
Albumin: 3.3 g/dL — ABNORMAL LOW (ref 3.5–5.0)
Alkaline Phosphatase: 60 U/L (ref 38–126)
Anion gap: 7 (ref 5–15)
BUN: 9 mg/dL (ref 6–20)
CO2: 30 mmol/L (ref 22–32)
Calcium: 9.1 mg/dL (ref 8.9–10.3)
Chloride: 100 mmol/L (ref 98–111)
Creatinine, Ser: 0.61 mg/dL (ref 0.44–1.00)
GFR, Estimated: >60 mL/min (ref >60)
Glucose, Bld: 86 mg/dL (ref 70–99)
Potassium: 3.7 mmol/L (ref 3.5–5.1)
Sodium: 137 mmol/L (ref 135–145)
Total Bilirubin: 0.5 mg/dL (ref 0.3–1.2)
Total Protein: 6.9 g/dL (ref 6.5–8.1)

## 2022-12-18 LAB — BRAIN NATRIURETIC PEPTIDE: B Natriuretic Peptide: 26 pg/mL (ref 0.0–100.0)

## 2022-12-18 LAB — I-STAT CG4 LACTIC ACID, ED: Lactic Acid, Venous: 0.7 mmol/L (ref 0.5–1.9)

## 2022-12-18 MED ORDER — GABAPENTIN 300 MG PO CAPS: 300.0000 mg | ORAL_CAPSULE | Freq: Three times a day (TID) | ORAL | 0 refills | Status: DC

## 2022-12-18 NOTE — ED Triage Notes (Signed)
Pt from [facility] for RLE edema, seen recently and given abx but reports no improvement. Reports pain in left leg  117/52 HR 100 100% RA 98% CBG 114

## 2022-12-18 NOTE — Discharge Instructions (Addendum)
As discussed, your evaluation today has been largely reassuring.  But, it is important that you monitor your condition carefully, and do not hesitate to return to the ED if you develop new, or concerning changes in your condition.  Your pain medication regimen is changing, please take this new regimen as prescribed.  Otherwise, please follow-up with your physician for appropriate ongoing care.

## 2022-12-18 NOTE — ED Provider Notes (Signed)
[place] EMERGENCY DEPARTMENT AT [facility] Provider Note   CSN: [id number] Arrival date & time: 12/18/22  1121     History  Chief Complaint  Patient presents with   Leg Pain    [name] is a 52 y.o. female.  HPI Presents with leg pain right greater than left, though increasingly symmetric.  Patient has multiple medical issues in a rehabilitation facility.  History includes cellulitis, and she has completed antibiotics.  She has been seen, evaluated here and at our affiliated facility  And had ultrasound performed 6 days ago.  She now presents with concern for pain in her legs as above, no fever, dyspnea or other complaints.   Home Medications Prior to Admission medications   Medication Sig Start Date End Date Taking? Authorizing Provider  acetaminophen (TYLENOL) 500 MG tablet Take 1 tablet (500 mg total) by mouth every 6 (six) hours as needed. Patient not taking: Reported on 11/26/2022 04/19/20   Marcine Matar, MD  atorvastatin (LIPITOR) 40 MG tablet Take 1 tablet (40 mg total) by mouth daily. 11/22/21   Marcine Matar, MD  butalbital-acetaminophen-caffeine (FIORICET) [phone] MG tablet Take 1 tablet by mouth every 6 (six) hours as needed for headache. 11/30/22   Amin, Ankit C, MD  cholecalciferol (VITAMIN D3) 25 MCG (1000 UNIT) tablet Take 1 tablet (1,000 Units total) by mouth daily. 04/07/20   Marcine Matar, MD  diazepam (VALIUM) 2 MG tablet Take 1 tablet (2 mg total) by mouth every 8 (eight) hours as needed (dizziness). 11/30/22   Amin, Ankit C, MD  diclofenac Sodium (VOLTAREN) 1 % GEL Apply 2 g topically 4 (four) times daily. 05/07/22   Marcine Matar, MD  docusate sodium (COLACE) 100 MG capsule Take 1 capsule (100 mg total) by mouth 2 (two) times daily as needed for mild constipation. 11/30/22   Amin, Ankit C, MD  DULERA 200-5 MCG/ACT AERO INHALE 2 PUFFS EVERY MORNING AND 2 PUFFS EVERY NIGHT AT BEDTIME Patient taking differently: Inhale 2  puffs into the lungs See admin instructions. INHALE 2 PUFFS EVERY MORNING AND 2 PUFFS EVERY NIGHT AT BEDTIME 06/04/22   Marcine Matar, MD  ELIQUIS 5 MG TABS tablet TAKE 1 TABLET BY MOUTH TWICE A DAY 07/02/22   Marcine Matar, MD  FARXIGA 10 MG TABS tablet TAKE 1 TABLET BY MOUTH DAILY 12/12/22   Marcine Matar, MD  FEROSUL 325 (65 Fe) MG tablet TAKE ONE TABLET BY MOUTH DAILY WITH BREAKFAST 06/06/21   Marcine Matar, MD  gabapentin (NEURONTIN) 300 MG capsule Take 1 capsule (300 mg total) by mouth 3 (three) times daily. 12/18/22   Gerhard Munch, MD  hydrocortisone cream 1 % Apply 1 application topically daily as needed for itching.    [provider]  hydrOXYzine (VISTARIL) 25 MG capsule Take 1 capsule (25 mg total) by mouth at bedtime as needed. Patient taking differently: Take 25 mg by mouth at bedtime as needed for anxiety or itching. 04/20/21   Marcine Matar, MD  meclizine (ANTIVERT) 25 MG tablet Take 1 tablet (25 mg total) by mouth 3 (three) times daily as needed for dizziness. 11/30/22   Amin, Ankit C, MD  nitroGLYCERIN (NITROSTAT) 0.4 MG SL tablet Place 1 tablet (0.4 mg total) under the tongue every 5 (five) minutes as needed for chest pain. 11/22/21   Marcine Matar, MD  oxyCODONE (OXY IR/ROXICODONE) 5 MG immediate release tablet Take 1 tablet (5 mg total) by mouth every  6 (six) hours as needed for severe pain. 11/30/22   Amin, Ankit C, MD  PARoxetine (PAXIL) 20 MG tablet TAKE 1 TABLET BY MOUTH DAILY 11/29/22   Marcine Matar, MD  Semaglutide, 1 MG/DOSE, 4 MG/3ML SOPN Inject 1 mg as directed once a week. 11/22/22   Marcine Matar, MD  vitamin B-12 (CYANOCOBALAMIN) 1000 MCG tablet Take 1,000 mcg by mouth daily.    [provider]      Allergies    Ace inhibitors, Aspirin, Hydromorphone, Vancomycin, Contrast media [iodinated contrast media], Dilaudid [hydromorphone hcl], and Lidocaine    Review of Systems   Review of Systems  All other systems reviewed  and are negative.   Physical Exam Updated Vital Signs BP 107/75   Pulse 96   Temp 98.2 F (36.8 C) (Oral)   Resp 16   SpO2 100%  Physical Exam Vitals and nursing note reviewed.  Constitutional:      General: She is not in acute distress.    Appearance: She is well-developed. She is obese. She is not ill-appearing or diaphoretic.  HENT:     Head: Normocephalic and atraumatic.  Eyes:     Conjunctiva/sclera: Conjunctivae normal.  Cardiovascular:     Rate and Rhythm: Normal rate and regular rhythm.     Pulses: Normal pulses.  Pulmonary:     Effort: Pulmonary effort is normal. No respiratory distress.     Breath sounds: Normal breath sounds. No stridor.  Abdominal:     General: There is no distension.  Musculoskeletal:     Comments: Substantial bilateral lower extremity edema with tapering at both ankles.  Minimal warmth bilaterally, symmetric, no erythema appreciated.  Trace edema bilaterally distally.  Skin:    General: Skin is warm and dry.  Neurological:     Mental Status: She is alert and oriented to person, place, and time.     Cranial Nerves: No cranial nerve deficit.  Psychiatric:        Mood and Affect: Mood normal.     ED Results / Procedures / Treatments   Labs (all labs ordered are listed, but only abnormal results are displayed) Labs Reviewed  COMPREHENSIVE METABOLIC PANEL - Abnormal; Notable for the following components:      Result Value   Albumin 3.3 (*)    AST 13 (*)    All other components within normal limits  CBC WITH DIFFERENTIAL/PLATELET - Abnormal; Notable for the following components:   RBC 3.47 (*)    Hemoglobin 9.6 (*)    HCT 33.0 (*)    MCHC 29.1 (*)    Platelets 134 (*)    Lymphs Abs 0.5 (*)    All other components within normal limits  BRAIN NATRIURETIC PEPTIDE  I-STAT CG4 LACTIC ACID, ED    EKG None  Radiology No results found.  Procedures Procedures    Medications Ordered in ED Medications - No data to display  ED  Course/ Medical Decision Making/ A&P                                 Medical Decision Making Obese adult female presents from rehab with concern for leg pain.  Given her history brought differential including cellulitis, osteomyelitis, bacteremia, sepsis, DVT considered.  Patient is distally neurovascular intact, no evidence for neuro dysfunction. Labs performed, vital signs stable on arrival, and throughout monitoring. Patient has cardiac 95 sinus normal Pulse ox 100% room air  normal Labs unremarkable no lactic acidosis, no leukocytosis, and without appreciable asymmetry, little early suspicion for recurrent cellulitis some suspicion for the patient has baseline obesity, lymphedema and recent cellulitis contributing her pain given these reassuring findings. Ultrasound from last week reviewed, no evidence for DVT bilaterally. Patient discharged in stable condition to her rehab facility.  Amount and/or Complexity of Data Reviewed External Data Reviewed: notes.    Details: Ultrasound notes from last week reviewed, echo as well. Labs: ordered. Decision-making details documented in ED Course. Radiology: ordered and independent interpretation performed. Decision-making details documented in ED Course.    Details: I discussed the ultrasound with our ultrasound tech as well.  Risk Decision regarding hospitalization. Diagnosis or treatment significantly limited by social determinants of health.   3:01 PM Patient awake, alert, in no distress, asking for food.        Final Clinical Impression(s) / ED Diagnoses Final diagnoses:  Pain in both lower extremities    Rx / DC Orders ED Discharge Orders          Ordered    gabapentin (NEURONTIN) 300 MG capsule  3 times daily        12/18/22 1501              Gerhard Munch, MD 12/18/22 1501

## 2023-01-01 ENCOUNTER — Ambulatory Visit: Payer: Commercial Managed Care - HMO | Admitting: Pulmonary Disease

## 2023-01-01 NOTE — Progress Notes (Deleted)
Synopsis: Referred for asthma by Marcine Matar, MD  Subjective:   PATIENT ID: [name] GENDER: female DOB: 1970/10/05, MRN: [id number]  No chief complaint on file.  [name] is a 52 yea rold woman with history of recurrent DVT, IDA with port for iron infusions, obesity, diastolic dysfunction, OSA last PSG done years ago in [place] in 2018 and was on CPAP referred for asthma followeing recent hospitalization for acute on chronic diastolic HF exacerbation, possible asthma exacerbation, discharged 8/1.   She says that prior to last hospitalization she had gone to cancer center to get her portacath flushed. She rapidly developed worsening cough when she got home. What she understands of hospitalization is that she had a whole lot of fluid on her heart which they relieved with lasix. She is unsure what made biggest difference with her recovery - whether it was treatment of CHF or her inhalers. She is unsure what her dry weight is. She wonders if stress is a trigger for her asthma. She does not have much of a cough currently. DOE to 10 minutes which has very gradually worsened over last couple of years.   Regarding her OSA her DME supplier had closed up shop by the time she tried to call with issues she was having with her machine. She does have a whole lot of daytime sleepiness. She does not snore. She does not have PND.   Father had emphysema, uncle with stomach cancer, grandmother died of breast cancer  She has 45 pack year smoking history quit in 2005  She is disabled, in past worked at [employer], Engineering geologist. SHe has two dogs at home, no pet bird.   Interval HPI 03/20/22: PFT with moderately severe obstruction and reduced FVC, neg BD response  PSG with moderate OSA  Admission for acute on chronic diastolic heart failure in 05/2021  Hospitalized at [facility] with flu and AECOPD 03/05/22-03/11/22  Still wheezing some. Takes last dose of prednisone tomorrow. She has  dry cough.   Otherwise pertinent review of systems is negative.  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   Morbid obesity due to excess calories (HCC)    OSA (obstructive sleep apnea) 07/11/2021   Sleep study April 06, 2019 (Dr. Craige Cotta): Moderate OSA (AHI of 24.4 and SPO2 low of 77%).  Majority events during REM sleep.-recommend CPAP, oral appliance or surgical. Reviewed sleep hygiene.  Avoid sedatives.     Osteoarthritis of left knee    Prediabetes    Small bowel obstruction (  HCC)    as a child   Speech impediment    Stutter / stammer     Family History  Problem Relation Age of Onset   Diabetes Mellitus II Mother    COPD Father    Diabetes Father    Colon cancer Father    Diabetes Mellitus II Maternal Grandmother    Breast cancer Paternal Grandfather    Liver disease Neg Hx    Esophageal cancer Neg Hx      Past Surgical History:  Procedure Laterality Date   ABDOMINAL WALL DEFECT REPAIR  1970   IR CV LINE INJECTION  10/24/2020   IR REMOVAL TUN  ACCESS W/ PORT W/O FL MOD SED  05/28/2021   IVC FILTER INSERTION  2017   Lower Extremity Venous Duplex  06/23/2020   No evidence of DVT or superficial thrombosis bilaterally.  No evidence of deep venous insufficiency bilaterally.  No evidence of SSV reflux.  Right GSV in the calf has reflux, no reflux in L GSV.;  Repeated in July 2020-no DVT   OOPHORECTOMY  1996   OOPHORECTOMY  1997   PORTACATH PLACEMENT  2014   TRANSTHORACIC ECHOCARDIOGRAM  05/18/2021   EF 60 to 65%.  No RWMA.  Mild concentric LVH.  "Normal diastolic parameters ".  Normal longitudinal strain.  Normal PAP, RAP.  Normal aortic and mitral valves.==> In July 2022, echo read as GR 1 DD otherwise stable.    Social History   Socioeconomic History   Marital status: Single    Spouse name: Not on file   Number of children: 0   Years of education: Not on file   Highest education level: Some college, no degree  Occupational History   Occupation: unemployed on disablity  Tobacco Use   Smoking status: Former    Current packs/day: 0.00    Average packs/day: 0.3 packs/day for 18.0 years (4.5 ttl pk-yrs)    Types: Cigarettes    Start date: 04/29/1984    Quit date: 04/29/2002    Years since quitting: 20.6   Smokeless tobacco: Never  Vaping Use   Vaping status: Never Used  Substance and Sexual Activity   Alcohol use: Not Currently   Drug use: Not Currently   Sexual activity: Not Currently  Other Topics Concern   Not on file  Social History Narrative   Not on file   Social Determinants of Health   Financial Resource Strain: Low Risk  (09/02/2022)   Overall Financial Resource Strain (CARDIA)    Difficulty of Paying Living Expenses: Not hard at all  Food Insecurity: No Food Insecurity (11/26/2022)   Hunger Vital Sign    Worried About Running Out of Food in the Last Year: Never true    Ran Out of Food in the Last Year: Never true  Transportation Needs: No Transportation Needs (11/26/2022)   PRAPARE - Scientist, research (physical sciences) (Medical): No    Lack of Transportation (Non-Medical): No  Physical Activity: Insufficiently Active (09/02/2022)   Exercise Vital Sign    Days of Exercise per Week: 3 days    Minutes of Exercise per Session: 20 min  Stress: No Stress Concern Present (09/02/2022)   [employer] of Occupational Health - Occupational Stress Questionnaire    Feeling of Stress : Only a little  Social Connections: Moderately Isolated (09/02/2022)   Social Connection and Isolation Panel [NHANES]    Frequency of Communication with Friends and Family: More than three times a week  Frequency of Social Gatherings with Friends and Family: Twice a week    Attends Religious Services: More than 4 times per year    Active Member of [employer] or Organizations: No    Attends Engineer, structural: Not on file    Marital Status: Never married  Intimate Partner Violence: Not At Risk (11/26/2022)   Humiliation, Afraid, Rape, and Kick questionnaire    Fear of Current or Ex-Partner: No    Emotionally Abused: No    Physically Abused: No    Sexually Abused: No     Allergies  Allergen Reactions   Ace Inhibitors Rash and Other (See Comments)    Make pt bleed   Aspirin Other (See Comments)    Per patient paperwork: blood clot?  Likely because of chronic DOAC   Hydromorphone Hives and Itching   Vancomycin Itching and Rash   Contrast Media [Iodinated Contrast Media] Hives   Dilaudid [Hydromorphone Hcl] Hives   Lidocaine Itching    Itching with Lidocaine patch reported 06/13/2021 vis telephone message.     Outpatient Medications Prior to Visit  Medication Sig Dispense Refill   acetaminophen (TYLENOL) 500 MG tablet Take 1 tablet (500 mg total) by mouth every 6 (six) hours as needed. (Patient not taking: Reported on 11/26/2022) 60 tablet 0   atorvastatin (LIPITOR) 40 MG tablet Take 1 tablet (40 mg total) by mouth daily. 90 tablet 1   butalbital-acetaminophen-caffeine (FIORICET) 50-325-40 MG tablet Take 1  tablet by mouth every 6 (six) hours as needed for headache. 14 tablet 0   cholecalciferol (VITAMIN D3) 25 MCG (1000 UNIT) tablet Take 1 tablet (1,000 Units total) by mouth daily. 30 tablet 5   diazepam (VALIUM) 2 MG tablet Take 1 tablet (2 mg total) by mouth every 8 (eight) hours as needed (dizziness). 15 tablet 0   diclofenac Sodium (VOLTAREN) 1 % GEL Apply 2 g topically 4 (four) times daily. 100 g 1   docusate sodium (COLACE) 100 MG capsule Take 1 capsule (100 mg total) by mouth 2 (two) times daily as needed for mild constipation.     DULERA 200-5 MCG/ACT AERO INHALE 2 PUFFS EVERY MORNING AND 2 PUFFS EVERY NIGHT AT BEDTIME (Patient taking differently: Inhale 2 puffs into the lungs See admin instructions. INHALE 2 PUFFS EVERY MORNING AND 2 PUFFS EVERY NIGHT AT BEDTIME) 13 g 3   ELIQUIS 5 MG TABS tablet TAKE 1 TABLET BY MOUTH TWICE A DAY 180 tablet 1   FARXIGA 10 MG TABS tablet TAKE 1 TABLET BY MOUTH DAILY 90 tablet 0   FEROSUL 325 (65 Fe) MG tablet TAKE ONE TABLET BY MOUTH DAILY WITH BREAKFAST 100 tablet 0   gabapentin (NEURONTIN) 300 MG capsule Take 1 capsule (300 mg total) by mouth 3 (three) times daily. 90 capsule 0   hydrocortisone cream 1 % Apply 1 application topically daily as needed for itching.     hydrOXYzine (VISTARIL) 25 MG capsule Take 1 capsule (25 mg total) by mouth at bedtime as needed. (Patient taking differently: Take 25 mg by mouth at bedtime as needed for anxiety or itching.) 30 capsule 1   meclizine (ANTIVERT) 25 MG tablet Take 1 tablet (25 mg total) by mouth 3 (three) times daily as needed for dizziness.     nitroGLYCERIN (NITROSTAT) 0.4 MG SL tablet Place 1 tablet (0.4 mg total) under the tongue every 5 (five) minutes as needed for chest pain. 30 tablet 0   oxyCODONE (OXY IR/ROXICODONE) 5 MG immediate release tablet Take 1  tablet (5 mg total) by mouth every 6 (six) hours as needed for severe pain. 20 tablet 0   PARoxetine (PAXIL) 20 MG tablet TAKE 1 TABLET BY MOUTH DAILY 30  tablet 1   Semaglutide, 1 MG/DOSE, 4 MG/3ML SOPN Inject 1 mg as directed once a week. 3 mL 4   vitamin B-12 (CYANOCOBALAMIN) 1000 MCG tablet Take 1,000 mcg by mouth daily.     No facility-administered medications prior to visit.       Objective:   Physical Exam:  General appearance: 52 y.o., female, NAD, conversant  Eyes:PERRL, tracking appropriately HENT: NCAT; oropharynx, MMM Neck: Trachea midline; no lymphadenopathy, no JVD Lungs: distant bl, with normal respiratory effort CV: RRR, no MRGs  Abdomen: Soft, non-tender, obese; non-distended, BS present  Extremities: 1+ BLE edema, warm Neuro:grossly nonfocal   There were no vitals filed for this visit.     on RA BMI Readings from Last 3 Encounters:  12/10/22 49.25 kg/m  11/26/22 49.25 kg/m  10/07/22 49.26 kg/m   Wt Readings from Last 3 Encounters:  12/10/22 278 lb (126.1 kg)  11/26/22 278 lb (126.1 kg)  10/07/22 278 lb 1.6 oz (126.1 kg)     CBC    Component Value Date/Time   WBC 4.1 12/18/2022 1212   RBC 3.47 (L) 12/18/2022 1212   HGB 9.6 (L) 12/18/2022 1212   HGB 11.7 (L) 10/07/2022 1114   HGB 12.3 01/31/2022 1341   HCT 33.0 (L) 12/18/2022 1212   HCT 38.8 01/31/2022 1341   PLT 134 (L) 12/18/2022 1212   PLT 153 10/07/2022 1114   PLT 109 (L) 01/31/2022 1341   MCV 95.1 12/18/2022 1212   MCV 87 01/31/2022 1341   MCH 27.7 12/18/2022 1212   MCHC 29.1 (L) 12/18/2022 1212   RDW 15.4 12/18/2022 1212   RDW 12.3 01/31/2022 1341   LYMPHSABS 0.5 (L) 12/18/2022 1212   LYMPHSABS 0.5 (L) 11/29/2020 1445   MONOABS 0.3 12/18/2022 1212   EOSABS 0.1 12/18/2022 1212   EOSABS 0.0 11/29/2020 1445   BASOSABS 0.0 12/18/2022 1212   BASOSABS 0.0 11/29/2020 1445      Chest Imaging: 11/23/20 CTA Chest reviewed by me with no PE. Prominent azygos which might be related to permcath vs PH. Positive egg and banana sign which could indicate PH. Large heart.  CXR 03/05/22 stable  Pulmonary Functions Testing Results:     Latest Ref Rng & Units 01/08/2021   11:29 AM  PFT Results  FVC-Pre L 1.66   FVC-Predicted Pre % 60   FVC-Post L 1.62   FVC-Predicted Post % 59   Pre FEV1/FVC % % 66   Post FEV1/FCV % % 72   FEV1-Pre L 1.09   FEV1-Predicted Pre % 49   FEV1-Post L 1.16    Moderately severe obstruction, reduced fvc, neg BD response   PSG 04/05/21: - Moderate obstructive sleep apnea with an AHI of 24.4 and SpO2 low of 77%.  The majority of events occurred in REM sleep with a REM AHI of 54. - Supplemental oxygen was not used during the study.  Echocardiogram:  TTE1/28/23:  1. Left ventricular ejection fraction, by estimation, is 60 to 65%. The  left ventricle has normal function. The left ventricle has no regional  wall motion abnormalities. There is mild concentric left ventricular  hypertrophy. Left ventricular diastolic  parameters were normal. The average left ventricular global longitudinal  strain is -19.3 %. The global longitudinal strain is normal.   2. Right ventricular systolic function is  normal. The right ventricular  size is normal. There is normal pulmonary artery systolic pressure.   3. The mitral valve is grossly normal. No evidence of mitral valve  regurgitation.   4. The aortic valve is grossly normal. Aortic valve regurgitation is not  visualized. No aortic stenosis is present.  Heart Catheterization: None    Assessment & Plan:   # DOE: likely multifactorial with deconditioning, asthma, volume overload/CHF playing roles.   # Moderate persistent asthma: # Asthma-COPD overlap with substantial secondhand smoke exposure   # Moderate OSA:   Plan: - Keep using dulera 2 puffs twice daily, rinse mouth out afterward - albuterol as needed - I'll price out other options which may improve asthma control - we'll try to order CPAP  - referral placed to pulmonary rehab - see you in 3 months or sooner if need be!     Martina Sinner, MD [facility] 01/01/2023  12:47 PM

## 2023-01-03 DIAGNOSIS — F331 Major depressive disorder, recurrent, moderate: Secondary | ICD-10-CM | POA: Diagnosis not present

## 2023-01-06 ENCOUNTER — Inpatient Hospital Stay (HOSPITAL_COMMUNITY)
Admission: EM | Admit: 2023-01-06 | Discharge: 2023-01-18 | DRG: 291 | Disposition: A | Discharge status: Skilled Nursing Facility | Payer: Commercial Managed Care - HMO | Attending: Internal Medicine | Admitting: Internal Medicine

## 2023-01-06 ENCOUNTER — Other Ambulatory Visit: Payer: Self-pay

## 2023-01-06 ENCOUNTER — Encounter (HOSPITAL_COMMUNITY): Payer: Self-pay

## 2023-01-06 ENCOUNTER — Ambulatory Visit: Payer: Medicaid Other | Admitting: Internal Medicine

## 2023-01-06 DIAGNOSIS — Z87891 Personal history of nicotine dependence: Secondary | ICD-10-CM

## 2023-01-06 DIAGNOSIS — R479 Unspecified speech disturbances: Secondary | ICD-10-CM | POA: Diagnosis present

## 2023-01-06 DIAGNOSIS — Z7951 Long term (current) use of inhaled steroids: Secondary | ICD-10-CM

## 2023-01-06 DIAGNOSIS — D649 Anemia, unspecified: Secondary | ICD-10-CM | POA: Diagnosis present

## 2023-01-06 DIAGNOSIS — F411 Generalized anxiety disorder: Secondary | ICD-10-CM | POA: Diagnosis present

## 2023-01-06 DIAGNOSIS — I10 Essential (primary) hypertension: Secondary | ICD-10-CM | POA: Diagnosis present

## 2023-01-06 DIAGNOSIS — Z713 Dietary counseling and surveillance: Secondary | ICD-10-CM

## 2023-01-06 DIAGNOSIS — Z86718 Personal history of other venous thrombosis and embolism: Secondary | ICD-10-CM

## 2023-01-06 DIAGNOSIS — Z95828 Presence of other vascular implants and grafts: Secondary | ICD-10-CM

## 2023-01-06 DIAGNOSIS — Z886 Allergy status to analgesic agent status: Secondary | ICD-10-CM

## 2023-01-06 DIAGNOSIS — R601 Generalized edema: Secondary | ICD-10-CM | POA: Diagnosis present

## 2023-01-06 DIAGNOSIS — F32A Depression, unspecified: Secondary | ICD-10-CM | POA: Diagnosis present

## 2023-01-06 DIAGNOSIS — T82898D Other specified complication of vascular prosthetic devices, implants and grafts, subsequent encounter: Secondary | ICD-10-CM

## 2023-01-06 DIAGNOSIS — E1169 Type 2 diabetes mellitus with other specified complication: Secondary | ICD-10-CM | POA: Diagnosis present

## 2023-01-06 DIAGNOSIS — Z608 Other problems related to social environment: Secondary | ICD-10-CM | POA: Diagnosis present

## 2023-01-06 DIAGNOSIS — I2510 Atherosclerotic heart disease of native coronary artery without angina pectoris: Secondary | ICD-10-CM | POA: Diagnosis present

## 2023-01-06 DIAGNOSIS — Z833 Family history of diabetes mellitus: Secondary | ICD-10-CM

## 2023-01-06 DIAGNOSIS — I5032 Chronic diastolic (congestive) heart failure: Secondary | ICD-10-CM

## 2023-01-06 DIAGNOSIS — I890 Lymphedema, not elsewhere classified: Secondary | ICD-10-CM

## 2023-01-06 DIAGNOSIS — Z90722 Acquired absence of ovaries, bilateral: Secondary | ICD-10-CM

## 2023-01-06 DIAGNOSIS — Z9189 Other specified personal risk factors, not elsewhere classified: Secondary | ICD-10-CM

## 2023-01-06 DIAGNOSIS — G4733 Obstructive sleep apnea (adult) (pediatric): Secondary | ICD-10-CM | POA: Diagnosis present

## 2023-01-06 DIAGNOSIS — R600 Localized edema: Secondary | ICD-10-CM | POA: Diagnosis not present

## 2023-01-06 DIAGNOSIS — G4700 Insomnia, unspecified: Secondary | ICD-10-CM | POA: Diagnosis not present

## 2023-01-06 DIAGNOSIS — J449 Chronic obstructive pulmonary disease, unspecified: Secondary | ICD-10-CM | POA: Diagnosis present

## 2023-01-06 DIAGNOSIS — R531 Weakness: Secondary | ICD-10-CM | POA: Diagnosis present

## 2023-01-06 DIAGNOSIS — I5033 Acute on chronic diastolic (congestive) heart failure: Secondary | ICD-10-CM | POA: Diagnosis present

## 2023-01-06 DIAGNOSIS — Z7901 Long term (current) use of anticoagulants: Secondary | ICD-10-CM

## 2023-01-06 DIAGNOSIS — Z888 Allergy status to other drugs, medicaments and biological substances status: Secondary | ICD-10-CM

## 2023-01-06 DIAGNOSIS — I252 Old myocardial infarction: Secondary | ICD-10-CM

## 2023-01-06 DIAGNOSIS — Z7985 Long-term (current) use of injectable non-insulin antidiabetic drugs: Secondary | ICD-10-CM

## 2023-01-06 DIAGNOSIS — E6609 Other obesity due to excess calories: Secondary | ICD-10-CM

## 2023-01-06 DIAGNOSIS — I110 Hypertensive heart disease with heart failure: Principal | ICD-10-CM | POA: Diagnosis present

## 2023-01-06 DIAGNOSIS — M1712 Unilateral primary osteoarthritis, left knee: Secondary | ICD-10-CM | POA: Diagnosis present

## 2023-01-06 DIAGNOSIS — Z885 Allergy status to narcotic agent status: Secondary | ICD-10-CM

## 2023-01-06 DIAGNOSIS — E785 Hyperlipidemia, unspecified: Secondary | ICD-10-CM | POA: Diagnosis present

## 2023-01-06 DIAGNOSIS — Z825 Family history of asthma and other chronic lower respiratory diseases: Secondary | ICD-10-CM

## 2023-01-06 DIAGNOSIS — G8929 Other chronic pain: Secondary | ICD-10-CM | POA: Diagnosis present

## 2023-01-06 DIAGNOSIS — Z91041 Radiographic dye allergy status: Secondary | ICD-10-CM

## 2023-01-06 DIAGNOSIS — Z79899 Other long term (current) drug therapy: Secondary | ICD-10-CM

## 2023-01-06 DIAGNOSIS — Z6841 Body Mass Index (BMI) 40.0 and over, adult: Secondary | ICD-10-CM

## 2023-01-06 DIAGNOSIS — E1142 Type 2 diabetes mellitus with diabetic polyneuropathy: Secondary | ICD-10-CM | POA: Diagnosis present

## 2023-01-06 DIAGNOSIS — R4789 Other speech disturbances: Secondary | ICD-10-CM | POA: Diagnosis present

## 2023-01-06 DIAGNOSIS — D638 Anemia in other chronic diseases classified elsewhere: Secondary | ICD-10-CM | POA: Diagnosis present

## 2023-01-06 LAB — CBC WITH DIFFERENTIAL/PLATELET
Abs Immature Granulocytes: 0.02 10*3/uL (ref 0.00–0.07)
Basophils Absolute: 0 10*3/uL (ref 0.0–0.1)
Basophils Relative: 1 %
Eosinophils Absolute: 0.1 10*3/uL (ref 0.0–0.5)
Eosinophils Relative: 2 %
HCT: 35.4 % — ABNORMAL LOW (ref 36.0–46.0)
Hemoglobin: 10.1 g/dL — ABNORMAL LOW (ref 12.0–15.0)
Immature Granulocytes: 0 %
Lymphocytes Relative: 11 %
Lymphs Abs: 0.6 10*3/uL — ABNORMAL LOW (ref 0.7–4.0)
MCH: 26.9 pg (ref 26.0–34.0)
MCHC: 28.5 g/dL — ABNORMAL LOW (ref 30.0–36.0)
MCV: 94.1 fL (ref 80.0–100.0)
Monocytes Absolute: 0.4 10*3/uL (ref 0.1–1.0)
Monocytes Relative: 7 %
Neutro Abs: 4.7 10*3/uL (ref 1.7–7.7)
Neutrophils Relative %: 79 %
Platelets: 174 10*3/uL (ref 150–400)
RBC: 3.76 MIL/uL — ABNORMAL LOW (ref 3.87–5.11)
RDW: 15.3 % (ref 11.5–15.5)
WBC: 5.8 10*3/uL (ref 4.0–10.5)
nRBC: 0 % (ref 0.0–0.2)

## 2023-01-06 LAB — BASIC METABOLIC PANEL
Anion gap: 11 (ref 5–15)
BUN: 9 mg/dL (ref 6–20)
CO2: 29 mmol/L (ref 22–32)
Calcium: 9.5 mg/dL (ref 8.9–10.3)
Chloride: 98 mmol/L (ref 98–111)
Creatinine, Ser: 0.72 mg/dL (ref 0.44–1.00)
GFR, Estimated: >60 mL/min (ref >60)
Glucose, Bld: 123 mg/dL — ABNORMAL HIGH (ref 70–99)
Potassium: 3.6 mmol/L (ref 3.5–5.1)
Sodium: 138 mmol/L (ref 135–145)

## 2023-01-06 NOTE — ED Notes (Signed)
Unable to obtain pt labs she states that the

## 2023-01-06 NOTE — ED Triage Notes (Addendum)
Patient BIB [facility] from home. Has edema and cellulitis in her legs. Released from rehab facility 3 days ago. Has not been able to get direction/help with how to take care of herself since home. Unable to bear weight on legs at all. Has heart failure and takes lasix.

## 2023-01-07 ENCOUNTER — Inpatient Hospital Stay (HOSPITAL_COMMUNITY): Payer: Commercial Managed Care - HMO

## 2023-01-07 ENCOUNTER — Encounter (HOSPITAL_COMMUNITY): Payer: Self-pay | Admitting: Internal Medicine

## 2023-01-07 ENCOUNTER — Emergency Department (HOSPITAL_COMMUNITY): Payer: Commercial Managed Care - HMO

## 2023-01-07 DIAGNOSIS — I10 Essential (primary) hypertension: Secondary | ICD-10-CM | POA: Diagnosis not present

## 2023-01-07 DIAGNOSIS — G4733 Obstructive sleep apnea (adult) (pediatric): Secondary | ICD-10-CM | POA: Diagnosis not present

## 2023-01-07 DIAGNOSIS — F32A Depression, unspecified: Secondary | ICD-10-CM | POA: Diagnosis not present

## 2023-01-07 DIAGNOSIS — I5033 Acute on chronic diastolic (congestive) heart failure: Secondary | ICD-10-CM | POA: Diagnosis not present

## 2023-01-07 DIAGNOSIS — I110 Hypertensive heart disease with heart failure: Secondary | ICD-10-CM | POA: Diagnosis not present

## 2023-01-07 DIAGNOSIS — Z95828 Presence of other vascular implants and grafts: Secondary | ICD-10-CM | POA: Diagnosis not present

## 2023-01-07 DIAGNOSIS — G4700 Insomnia, unspecified: Secondary | ICD-10-CM | POA: Diagnosis not present

## 2023-01-07 DIAGNOSIS — Z9189 Other specified personal risk factors, not elsewhere classified: Secondary | ICD-10-CM | POA: Diagnosis not present

## 2023-01-07 DIAGNOSIS — Z7985 Long-term (current) use of injectable non-insulin antidiabetic drugs: Secondary | ICD-10-CM | POA: Diagnosis not present

## 2023-01-07 DIAGNOSIS — R531 Weakness: Secondary | ICD-10-CM | POA: Diagnosis not present

## 2023-01-07 DIAGNOSIS — I890 Lymphedema, not elsewhere classified: Secondary | ICD-10-CM | POA: Diagnosis not present

## 2023-01-07 DIAGNOSIS — E8770 Fluid overload, unspecified: Secondary | ICD-10-CM | POA: Diagnosis not present

## 2023-01-07 DIAGNOSIS — Z7901 Long term (current) use of anticoagulants: Secondary | ICD-10-CM | POA: Diagnosis not present

## 2023-01-07 DIAGNOSIS — R600 Localized edema: Secondary | ICD-10-CM | POA: Diagnosis not present

## 2023-01-07 DIAGNOSIS — E1142 Type 2 diabetes mellitus with diabetic polyneuropathy: Secondary | ICD-10-CM | POA: Diagnosis not present

## 2023-01-07 DIAGNOSIS — M1712 Unilateral primary osteoarthritis, left knee: Secondary | ICD-10-CM | POA: Diagnosis not present

## 2023-01-07 DIAGNOSIS — Z6841 Body Mass Index (BMI) 40.0 and over, adult: Secondary | ICD-10-CM | POA: Diagnosis not present

## 2023-01-07 DIAGNOSIS — J449 Chronic obstructive pulmonary disease, unspecified: Secondary | ICD-10-CM | POA: Diagnosis not present

## 2023-01-07 DIAGNOSIS — F411 Generalized anxiety disorder: Secondary | ICD-10-CM | POA: Diagnosis not present

## 2023-01-07 DIAGNOSIS — Z7951 Long term (current) use of inhaled steroids: Secondary | ICD-10-CM | POA: Diagnosis not present

## 2023-01-07 DIAGNOSIS — E1169 Type 2 diabetes mellitus with other specified complication: Secondary | ICD-10-CM | POA: Diagnosis not present

## 2023-01-07 DIAGNOSIS — I5032 Chronic diastolic (congestive) heart failure: Secondary | ICD-10-CM | POA: Diagnosis not present

## 2023-01-07 DIAGNOSIS — R479 Unspecified speech disturbances: Secondary | ICD-10-CM | POA: Diagnosis not present

## 2023-01-07 DIAGNOSIS — I252 Old myocardial infarction: Secondary | ICD-10-CM | POA: Diagnosis not present

## 2023-01-07 DIAGNOSIS — R601 Generalized edema: Secondary | ICD-10-CM | POA: Diagnosis not present

## 2023-01-07 DIAGNOSIS — G8929 Other chronic pain: Secondary | ICD-10-CM | POA: Diagnosis not present

## 2023-01-07 DIAGNOSIS — D638 Anemia in other chronic diseases classified elsewhere: Secondary | ICD-10-CM | POA: Diagnosis not present

## 2023-01-07 DIAGNOSIS — E785 Hyperlipidemia, unspecified: Secondary | ICD-10-CM | POA: Diagnosis not present

## 2023-01-07 DIAGNOSIS — I2510 Atherosclerotic heart disease of native coronary artery without angina pectoris: Secondary | ICD-10-CM | POA: Diagnosis not present

## 2023-01-07 DIAGNOSIS — D649 Anemia, unspecified: Secondary | ICD-10-CM | POA: Diagnosis not present

## 2023-01-07 LAB — CBC
HCT: 29.6 % — ABNORMAL LOW (ref 36.0–46.0)
Hemoglobin: 8.6 g/dL — ABNORMAL LOW (ref 12.0–15.0)
MCH: 27.1 pg (ref 26.0–34.0)
MCHC: 29.1 g/dL — ABNORMAL LOW (ref 30.0–36.0)
MCV: 93.4 fL (ref 80.0–100.0)
Platelets: 160 10*3/uL (ref 150–400)
RBC: 3.17 MIL/uL — ABNORMAL LOW (ref 3.87–5.11)
RDW: 15.3 % (ref 11.5–15.5)
WBC: 5.4 10*3/uL (ref 4.0–10.5)
nRBC: 0 % (ref 0.0–0.2)

## 2023-01-07 LAB — BASIC METABOLIC PANEL
Anion gap: 10 (ref 5–15)
BUN: 8 mg/dL (ref 6–20)
CO2: 30 mmol/L (ref 22–32)
Calcium: 9.2 mg/dL (ref 8.9–10.3)
Chloride: 99 mmol/L (ref 98–111)
Creatinine, Ser: 0.66 mg/dL (ref 0.44–1.00)
GFR, Estimated: >60 mL/min (ref >60)
Glucose, Bld: 97 mg/dL (ref 70–99)
Potassium: 3.3 mmol/L — ABNORMAL LOW (ref 3.5–5.1)
Sodium: 139 mmol/L (ref 135–145)

## 2023-01-07 LAB — TROPONIN I (HIGH SENSITIVITY): Troponin I (High Sensitivity): 4 ng/L (ref <18)

## 2023-01-07 LAB — BRAIN NATRIURETIC PEPTIDE: B Natriuretic Peptide: 43.7 pg/mL (ref 0.0–100.0)

## 2023-01-07 LAB — MAGNESIUM: Magnesium: 2.1 mg/dL (ref 1.7–2.4)

## 2023-01-07 MED ORDER — APIXABAN 5 MG PO TABS: 5.0000 mg | ORAL_TABLET | Freq: Two times a day (BID) | ORAL | Status: DC

## 2023-01-07 MED ORDER — BUTALBITAL-APAP-CAFFEINE 50-325-40 MG PO TABS
1.0000 | ORAL_TABLET | Freq: Four times a day (QID) | ORAL | Status: DC | PRN
  Administered 2023-01-08 – 2023-01-09 (×4): 1 via ORAL
  Filled 2023-01-07 (×4): qty 1

## 2023-01-07 MED ORDER — OXYCODONE HCL 5 MG PO TABS
5.0000 mg | ORAL_TABLET | Freq: Once | ORAL | Status: AC
  Administered 2023-01-07: 5 mg via ORAL
  Filled 2023-01-07: qty 1

## 2023-01-07 MED ORDER — FERROUS SULFATE 325 (65 FE) MG PO TABS
325.0000 mg | ORAL_TABLET | Freq: Every day | ORAL | Status: DC
  Administered 2023-01-07 – 2023-01-18 (×12): 325 mg via ORAL
  Filled 2023-01-07 (×12): qty 1

## 2023-01-07 MED ORDER — DIAZEPAM 2 MG PO TABS
2.0000 mg | ORAL_TABLET | Freq: Three times a day (TID) | ORAL | Status: DC | PRN
  Administered 2023-01-10 – 2023-01-17 (×2): 2 mg via ORAL
  Filled 2023-01-07 (×3): qty 1

## 2023-01-07 MED ORDER — OXYCODONE HCL 5 MG PO TABS
5.0000 mg | ORAL_TABLET | Freq: Four times a day (QID) | ORAL | Status: DC | PRN
  Administered 2023-01-07 – 2023-01-18 (×25): 5 mg via ORAL
  Filled 2023-01-07 (×25): qty 1

## 2023-01-07 MED ORDER — MOMETASONE FURO-FORMOTEROL FUM 200-5 MCG/ACT IN AERO
2.0000 | INHALATION_SPRAY | Freq: Two times a day (BID) | RESPIRATORY_TRACT | Status: DC
  Administered 2023-01-07 – 2023-01-18 (×22): 2 via RESPIRATORY_TRACT
  Filled 2023-01-07 (×2): qty 8.8

## 2023-01-07 MED ORDER — GABAPENTIN 300 MG PO CAPS: 300.0000 mg | ORAL_CAPSULE | Freq: Three times a day (TID) | ORAL | Status: DC

## 2023-01-07 MED ORDER — HYDROXYZINE PAMOATE 25 MG PO CAPS: 25.0000 mg | ORAL_CAPSULE | Freq: Every evening | ORAL | Status: DC | PRN

## 2023-01-07 MED ORDER — FUROSEMIDE 10 MG/ML IJ SOLN
40.0000 mg | Freq: Once | INTRAMUSCULAR | Status: AC
  Administered 2023-01-07: 40 mg via INTRAVENOUS
  Filled 2023-01-07: qty 4

## 2023-01-07 MED ORDER — FUROSEMIDE 10 MG/ML IJ SOLN
20.0000 mg | Freq: Two times a day (BID) | INTRAMUSCULAR | Status: DC
  Administered 2023-01-07 – 2023-01-08 (×3): 20 mg via INTRAVENOUS
  Filled 2023-01-07: qty 4
  Filled 2023-01-07 (×2): qty 2

## 2023-01-07 MED ORDER — APIXABAN 5 MG PO TABS
5.0000 mg | ORAL_TABLET | Freq: Two times a day (BID) | ORAL | Status: DC
  Administered 2023-01-07 – 2023-01-18 (×23): 5 mg via ORAL
  Filled 2023-01-07 (×23): qty 1

## 2023-01-07 MED ORDER — PAROXETINE HCL 20 MG PO TABS
20.0000 mg | ORAL_TABLET | Freq: Every day | ORAL | Status: DC
  Administered 2023-01-07 – 2023-01-18 (×12): 20 mg via ORAL
  Filled 2023-01-07 (×12): qty 1

## 2023-01-07 MED ORDER — HYDROXYZINE HCL 25 MG PO TABS
25.0000 mg | ORAL_TABLET | Freq: Every evening | ORAL | Status: DC | PRN
  Administered 2023-01-08 – 2023-01-09 (×2): 25 mg via ORAL
  Filled 2023-01-07 (×4): qty 1

## 2023-01-07 MED ORDER — VITAMIN B-12 1000 MCG PO TABS
1000.0000 ug | ORAL_TABLET | Freq: Every day | ORAL | Status: DC
  Administered 2023-01-07 – 2023-01-18 (×12): 1000 ug via ORAL
  Filled 2023-01-07 (×12): qty 1

## 2023-01-07 MED ORDER — POTASSIUM CHLORIDE CRYS ER 20 MEQ PO TBCR
40.0000 meq | EXTENDED_RELEASE_TABLET | Freq: Once | ORAL | Status: AC
  Administered 2023-01-07: 40 meq via ORAL
  Filled 2023-01-07: qty 2

## 2023-01-07 MED ORDER — ATORVASTATIN CALCIUM 40 MG PO TABS
40.0000 mg | ORAL_TABLET | Freq: Every day | ORAL | Status: DC
  Administered 2023-01-07 – 2023-01-18 (×12): 40 mg via ORAL
  Filled 2023-01-07 (×12): qty 1

## 2023-01-07 MED ORDER — MORPHINE SULFATE (PF) 2 MG/ML IV SOLN
2.0000 mg | INTRAVENOUS | Status: AC | PRN
  Administered 2023-01-07 – 2023-01-08 (×4): 2 mg via INTRAVENOUS
  Filled 2023-01-07 (×5): qty 1

## 2023-01-07 MED ORDER — VITAMIN D 25 MCG (1000 UNIT) PO TABS
1000.0000 [IU] | ORAL_TABLET | Freq: Every day | ORAL | Status: DC
  Administered 2023-01-07 – 2023-01-18 (×12): 1000 [IU] via ORAL
  Filled 2023-01-07 (×12): qty 1

## 2023-01-07 MED ORDER — GABAPENTIN 300 MG PO CAPS
300.0000 mg | ORAL_CAPSULE | Freq: Two times a day (BID) | ORAL | Status: DC
  Administered 2023-01-07 – 2023-01-13 (×14): 300 mg via ORAL
  Filled 2023-01-07 (×14): qty 1

## 2023-01-07 NOTE — ED Provider Notes (Signed)
[name]   CSN: [id number] Arrival date & time: 01/06/23  1622     History  Chief Complaint  Patient presents with   Edema    [name] is a 52 y.o. female.  The history is provided by the patient and medical records.   [name] is a 52 y.o. female who presents to the Emergency Department complaining of bilateral lower extremity edema.  She has chronic lymphedema to bilateral lower extremities and has been in a rehab facility since her hospital discharge in August after being admitted for vertigo.  3 days ago she was discharged from her rehab facility to home.  She currently lives with her mother and has been urinating on herself because she cannot move her legs due to significant worsening of her lower extremity edema since hospital discharge.  She has nausea, dizziness.  She does report mild shortness of breath and chest pain that is sharp and constant for 1 week.  She also reports a cough that is nonproductive.  Prior to being in rehab she was using a walker or cane.  While she was in rehab she states that they did not help her improve and she laid in the bed.  She is taking Lasix once daily as well as Eliquis for history of DVT. She has been taking 80 mg of Lasix at home but states that this is not helping her get fluid off.     Home Medications Prior to Admission medications   Medication Sig Start Date End Date Taking? Authorizing Provider  acetaminophen (TYLENOL) 500 MG tablet Take 1 tablet (500 mg total) by mouth every 6 (six) hours as needed. 04/19/20  Yes Marcine Matar, MD  amLODipine (NORVASC) 5 MG tablet Take 5 mg by mouth daily. 12/12/22  Yes [provider]  atorvastatin (LIPITOR) 40 MG tablet Take 1 tablet (40 mg total) by mouth daily. 11/22/21  Yes Marcine Matar, MD  butalbital-acetaminophen-caffeine (FIORICET) [phone] MG tablet Take 1 tablet by mouth every 6  (six) hours as needed for headache. 11/30/22  Yes Amin, Ankit C, MD  cholecalciferol (VITAMIN D3) 25 MCG (1000 UNIT) tablet Take 1 tablet (1,000 Units total) by mouth daily. 04/07/20  Yes Marcine Matar, MD  diazepam (VALIUM) 2 MG tablet Take 1 tablet (2 mg total) by mouth every 8 (eight) hours as needed (dizziness). 11/30/22  Yes Amin, Ankit C, MD  docusate sodium (COLACE) 100 MG capsule Take 1 capsule (100 mg total) by mouth 2 (two) times daily as needed for mild constipation. 11/30/22  Yes Amin, Ankit C, MD  DULERA 200-5 MCG/ACT AERO INHALE 2 PUFFS EVERY MORNING AND 2 PUFFS EVERY NIGHT AT BEDTIME Patient taking differently: Inhale 2 puffs into the lungs See admin instructions. INHALE 2 PUFFS EVERY MORNING AND 2 PUFFS EVERY NIGHT AT BEDTIME 06/04/22  Yes Marcine Matar, MD  ELIQUIS 5 MG TABS tablet TAKE 1 TABLET BY MOUTH TWICE A DAY 07/02/22  Yes Marcine Matar, MD  FARXIGA 10 MG TABS tablet TAKE 1 TABLET BY MOUTH DAILY 12/12/22  Yes Marcine Matar, MD  FEROSUL 325 (65 Fe) MG tablet TAKE ONE TABLET BY MOUTH DAILY WITH BREAKFAST 06/06/21  Yes Marcine Matar, MD  furosemide (LASIX) 80 MG tablet Take 80 mg by mouth daily. 12/12/22  Yes [provider]  gabapentin (NEURONTIN) 300 MG capsule Take 1 capsule (300 mg total) by mouth 3 (three) times daily. 12/18/22  Yes Gerhard Munch, MD  hydrOXYzine (VISTARIL) 25 MG capsule Take 1 capsule (25 mg total) by mouth at bedtime as needed. Patient taking differently: Take 25 mg by mouth at bedtime as needed for anxiety or itching. 04/20/21  Yes Marcine Matar, MD  meclizine (ANTIVERT) 25 MG tablet Take 1 tablet (25 mg total) by mouth 3 (three) times daily as needed for dizziness. 11/30/22  Yes Amin, Ankit C, MD  nitroGLYCERIN (NITROSTAT) 0.4 MG SL tablet Place 1 tablet (0.4 mg total) under the tongue every 5 (five) minutes as needed for chest pain. 11/22/21  Yes Marcine Matar, MD  oxyCODONE (OXY IR/ROXICODONE) 5 MG immediate release tablet  Take 1 tablet (5 mg total) by mouth every 6 (six) hours as needed for severe pain. 11/30/22  Yes Amin, Ankit C, MD  PARoxetine (PAXIL) 20 MG tablet TAKE 1 TABLET BY MOUTH DAILY 11/29/22  Yes Marcine Matar, MD  Semaglutide, 1 MG/DOSE, 4 MG/3ML SOPN Inject 1 mg as directed once a week. Patient taking differently: Inject 1 mg as directed once a week. Patient takes on Wednesdays 11/22/22  Yes Marcine Matar, MD  vitamin B-12 (CYANOCOBALAMIN) 1000 MCG tablet Take 1,000 mcg by mouth daily.   Yes [provider]  diclofenac Sodium (VOLTAREN) 1 % GEL Apply 2 g topically 4 (four) times daily. 05/07/22   Marcine Matar, MD  hydrocortisone cream 1 % Apply 1 application topically daily as needed for itching.    [provider]      Allergies    Ace inhibitors, Aspirin, Hydromorphone, Vancomycin, Contrast media [iodinated contrast media], Dilaudid [hydromorphone hcl], and Lidocaine    Review of Systems   Review of Systems  All other systems reviewed and are negative.   Physical Exam Updated Vital Signs BP 108/67   Pulse (!) 101   Temp 98.8 F (37.1 C) (Oral)   Resp 19   Ht 5\' 3"  (1.6 m)   Wt (!) 150.9 kg   SpO2 98%   BMI 58.93 kg/m  Physical Exam Vitals and nursing [name] reviewed.  Constitutional:      Appearance: She is well-developed.  HENT:     Head: Normocephalic and atraumatic.  Cardiovascular:     Rate and Rhythm: Normal rate and regular rhythm.     Heart sounds: No murmur heard. Pulmonary:     Effort: Pulmonary effort is normal. No respiratory distress.     Breath sounds: Normal breath sounds.  Abdominal:     Palpations: Abdomen is soft.     Tenderness: There is no abdominal tenderness. There is no guarding or rebound.  Musculoskeletal:        General: No tenderness.     Comments: Pitting edema to BLE  Skin:    General: Skin is warm and dry.  Neurological:     Mental Status: She is alert and oriented to person, place, and time.  Psychiatric:         Behavior: Behavior normal.     ED Results / Procedures / Treatments   Labs (all labs ordered are listed, but only abnormal results are displayed) Labs Reviewed  CBC WITH DIFFERENTIAL/PLATELET - Abnormal; Notable for the following components:      Result Value   RBC 3.76 (*)    Hemoglobin 10.1 (*)    HCT 35.4 (*)    MCHC 28.5 (*)    Lymphs Abs 0.6 (*)    All other components within normal limits  BASIC METABOLIC PANEL - Abnormal;  Notable for the following components:   Glucose, Bld 123 (*)    All other components within normal limits  CBC - Abnormal; Notable for the following components:   RBC 3.17 (*)    Hemoglobin 8.6 (*)    HCT 29.6 (*)    MCHC 29.1 (*)    All other components within normal limits  BASIC METABOLIC PANEL - Abnormal; Notable for the following components:   Potassium 3.3 (*)    All other components within normal limits  BRAIN NATRIURETIC PEPTIDE  MAGNESIUM  TROPONIN I (HIGH SENSITIVITY)    EKG None  Radiology DG Chest 2 View  Result Date: 01/07/2023 CLINICAL DATA:  Shortness of breath, edema, and cellulitis of both legs. CHF. EXAM: CHEST - 2 VIEW COMPARISON:  03/05/2022. FINDINGS: The heart is enlarged. Atherosclerotic calcification of the aorta is noted. There is a rounded 3.1 cm density in the paramediastinal border on the right measuring up to 3.1 cm. Lung volumes are low. No consolidation, effusion, or pneumothorax. No acute osseous abnormality. IMPRESSION: 1. Rounded 3.1 cm density along the paramediastinal border on the right, concerning for mass. CT is recommended for further evaluation. 2. No active disease. Electronically Signed   By: Thornell Sartorius M.D.   On: 01/07/2023 02:05    Procedures Procedures    Medications Ordered in ED Medications  morphine (PF) 2 MG/ML injection 2 mg (2 mg Intravenous Given 01/07/23 0423)  atorvastatin (LIPITOR) tablet 40 mg (has no administration in time range)  butalbital-acetaminophen-caffeine (FIORICET) 50-325-40  MG per tablet 1 tablet (has no administration in time range)  cholecalciferol (VITAMIN D3) 25 MCG (1000 UNIT) tablet 1,000 Units (has no administration in time range)  diazepam (VALIUM) tablet 2 mg (has no administration in time range)  mometasone-formoterol (DULERA) 200-5 MCG/ACT inhaler 2 puff (has no administration in time range)  ferrous sulfate tablet 325 mg (has no administration in time range)  PARoxetine (PAXIL) tablet 20 mg (has no administration in time range)  cyanocobalamin (VITAMIN B12) tablet 1,000 mcg (has no administration in time range)  apixaban (ELIQUIS) tablet 5 mg (5 mg Oral Given 01/07/23 0546)  gabapentin (NEURONTIN) capsule 300 mg (300 mg Oral Given 01/07/23 0546)  furosemide (LASIX) injection 20 mg (20 mg Intravenous Given 01/07/23 0546)  hydrOXYzine (ATARAX) tablet 25 mg (has no administration in time range)  furosemide (LASIX) injection 40 mg (40 mg Intravenous Given 01/07/23 0105)  oxyCODONE (Oxy IR/ROXICODONE) immediate release tablet 5 mg (5 mg Oral Given 01/07/23 0245)    ED Course/ Medical Decision Making/ A&P                                 Medical Decision Making Amount and/or Complexity of Data Reviewed Labs: ordered. Radiology: ordered.  Risk Prescription drug management. Decision regarding hospitalization.   Patient here for evaluation of progressive bilateral lower extremity edema, unable to do self-care at home.  She is very edematous on examination.  Examination is not consistent with acute soft tissue infection.  She is on anticoagulation for history of chronic DVT.  Presentation is not consistent with recurrent DVT/PE.  She was treated with furosemide with good diuresis in the emergency department.  Weight today is 150.9 kg, which is a significant increase from her recent hospitalization.  Given patient's immobility, profound volume overload medicine consulted for admission for diuresis.        Final Clinical Impression(s) / ED  Diagnoses Final diagnoses:  Peripheral edema    Rx / DC Orders ED Discharge Orders     None         Tilden Fossa, MD 01/07/23 [phone]

## 2023-01-07 NOTE — Hospital Course (Signed)
HPI: [name] is a 52 y.o. female with medical history significant for chronic diastolic CHF, COPD, history of DVT on Eliquis, type 2 diabetes, diabetic polyneuropathy on gabapentin, recent admission for dizziness (11/25/22-11/30/22-Home p.o. Lasix 80 mg daily was held), discharged to SNF, and from there, recently returned home where she lives with her elderly mother, who presents to the ED via EMS due to bilateral lower extremity edema all the way up to her abdomen.     She gained about 50 pounds in less than a month.  Dry weight 278 pounds and currently weighs about 332 pounds.  No anginal symptoms reported.  Since returning home from SNF, she rarely got out of bed due to her excessive peripheral edema and diabetic polyneuropathy.   In the ED, noted to be volume overload.  IV Lasix was initiated.  Chest x-ray revealed rounded 3.1 cm density along the paramediastinal border on the right concerning for mass, CT is recommended for further evaluation.  No active disease.  The heart is enlarged.  CT chest is pending.   Admitted by [facility], hospitalist service.  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  Antibiotic Therapy:   Procedures:   Consultants:

## 2023-01-07 NOTE — Progress Notes (Signed)
PT Cancellation Note  Patient Details Name: [name] MRN: [id number] DOB: 23-Oct-1970   Cancelled Treatment:    Reason Eval/Treat Not Completed: Other (comment) Transfer to [facility]. Blanchard Kelch PT Acute Rehabilitation Services Office [phone] Weekend [phone]   Rada Hay 01/07/2023, 8:18 AM

## 2023-01-07 NOTE — Evaluation (Signed)
Occupational Therapy Evaluation Patient Details Name: [name] MRN: [id number] DOB: [birth year] Today's Date: 01/07/2023   History of Present Illness [name] is a 52 y.o. female presented with volume overload. She had recent admission for dizziness (11/25/22-11/30/22), discharged to SNF, and from there, recently returned home where she lives with her elderly mother and now presents to the ED via EMS due to bilateral lower extremity edema all the way up to her abdomen. Since returning home from SNF, she rarely got out of bed due to her excessive peripheral edema and diabetic polyneuropathy. PHMx: chronic diastolic CHF, COPD, history of DVT on Eliquis, type 2 diabetes, diabetic polyneuropathy on gabapentin.   Clinical Impression   [name] was evaluated s/p the above admission list. Since recent d/c from SNF she has not been able to mobilize well and needs significant assist with ADLs. This therapist is familiar with pt from 10/2022 admission and pt has notable decline in function and weakness since then. Upon evaluation the pt was limited by generalized weakness, BLE pain, unsteady gait and poor activity tolerance. Overall she needed min A for bed mobility and to stand at EOB with RW, pt was limited to side stepping at the EOB due to BLE pain and fear of falling. Due to the deficits listed below the pt also needs up to max A for ADLs and set up A for UB ADLs in sitting. Pt will benefit from continued acute OT services and skilled inpatient follow up therapy, <3 hours/day.        If plan is discharge home, recommend the following: A lot of help with bathing/dressing/bathroom;A lot of help with walking and/or transfers;Assistance with cooking/housework;Direct supervision/assist for medications management;Direct supervision/assist for financial management;Assist for transportation;Help with stairs or ramp for entrance    Functional Status Assessment  Patient has had a recent  decline in their functional status and demonstrates the ability to make significant improvements in function in a reasonable and predictable amount of time.  Equipment Recommendations  None recommended by OT       Precautions / Restrictions Precautions Precautions: Fall Restrictions Weight Bearing Restrictions: No      Mobility Bed Mobility Overal bed mobility: Needs Assistance Bed Mobility: Supine to Sit, Sit to Supine     Supine to sit: Contact guard Sit to supine: Min assist   General bed mobility comments: increased time    Transfers Overall transfer level: Needs assistance Equipment used: Rolling walker (2 wheels) Transfers: Sit to/from Stand Sit to Stand: Min assist           General transfer comment: min A to stand at EOB with RW, able to take a few side steps to [facility]      Balance Overall balance assessment: Needs assistance Sitting-balance support: Feet supported Sitting balance-Leahy Scale: Good     Standing balance support: Bilateral upper extremity supported, During functional activity Standing balance-Leahy Scale: Poor             ADL either performed or assessed with clinical judgement   ADL Overall ADL's : Needs assistance/impaired Eating/Feeding: Independent;Sitting   Grooming: Set up;Sitting   Upper Body Bathing: Set up;Standing   Lower Body Bathing: Maximal assistance;Sit to/from stand   Upper Body Dressing : Set up;Sitting   Lower Body Dressing: Maximal assistance;Sit to/from stand   Toilet Transfer: Moderate assistance;Stand-pivot;BSC/3in1;Rolling walker (2 wheels)   Toileting- Clothing Manipulation and Hygiene: Maximal assistance;Sit to/from stand       Functional mobility during ADLs: Minimal assistance;Rolling walker (  2 wheels) General ADL Comments: limited by weakness BLE pain, unsteady balance and fear of falling     Vision Baseline Vision/History: 0 No visual deficits Vision Assessment?: No apparent visual deficits      Perception Perception: Not tested       Praxis Praxis: Not tested       Pertinent Vitals/Pain Pain Assessment Pain Assessment: Faces Faces Pain Scale: Hurts even more Pain Location: BLEs Pain Descriptors / Indicators: Discomfort Pain Intervention(s): Limited activity within patient's tolerance, Monitored during session     Extremity/Trunk Assessment Upper Extremity Assessment Upper Extremity Assessment: Overall WFL for tasks assessed   Lower Extremity Assessment Lower Extremity Assessment: Defer to PT evaluation   Cervical / Trunk Assessment Cervical / Trunk Assessment: Other exceptions Cervical / Trunk Exceptions: increased body habitus   Communication Communication Communication: No apparent difficulties   Cognition Arousal: Alert Behavior During Therapy: WFL for tasks assessed/performed Overall Cognitive Status: Within Functional Limits for tasks assessed         General Comments: anxious/fear of falling, demonstrates self limiting behaviors     General Comments  VSS on RA     Home Living Family/patient expects to be discharged to:: Private residence Living Arrangements: Parent Available Help at Discharge: Family;Available 24 hours/day Type of Home: Apartment Home Access: Level entry     Home Layout: One level     Bathroom Shower/Tub: Chief Strategy Officer: Standard Bathroom Accessibility: Yes   Home Equipment: Agricultural consultant (2 wheels);Cane - single point          Prior Functioning/Environment Prior Level of Function : Independent/Modified Independent             Mobility Comments: prior to last hospitalization she was ambulatory with SPC. since d/c from SNF she was nearly bed bound ADLs Comments: mod I prior to last hospitalization; since d/c to SNF and then home she has needed a lot of assist        OT Problem List: Decreased range of motion;Decreased strength;Decreased activity tolerance;Impaired balance (sitting  and/or standing);Decreased safety awareness;Decreased knowledge of use of DME or AE;Decreased knowledge of precautions      OT Treatment/Interventions: Self-care/ADL training;Therapeutic exercise;DME and/or AE instruction;Therapeutic activities;Patient/family education;Balance training    OT Goals(Current goals can be found in the care plan section) Acute Rehab OT Goals Patient Stated Goal: lost fluid OT Goal Formulation: With patient Time For Goal Achievement: 01/21/23 Potential to Achieve Goals: Good ADL Goals Pt Will Perform Lower Body Dressing: with min assist;sit to/from stand;with adaptive equipment Pt Will Transfer to Toilet: with contact guard assist;stand pivot transfer;bedside commode Pt Will Perform Toileting - Clothing Manipulation and hygiene: with contact guard assist;sit to/from stand Additional ADL Goal #1: Pt will tolerate at least 5 minutes of standing as a precursor to OOB ADLs  OT Frequency: Min 1X/week       AM-PAC OT "6 Clicks" Daily Activity     Outcome Measure Help from another person eating meals?: None Help from another person taking care of personal grooming?: A Little Help from another person toileting, which includes using toliet, bedpan, or urinal?: A Lot Help from another person bathing (including washing, rinsing, drying)?: A Lot Help from another person to put on and taking off regular upper body clothing?: A Little Help from another person to put on and taking off regular lower body clothing?: A Lot 6 Click Score: 16   End of Session Equipment Utilized During Treatment: Rolling walker (2 wheels) Nurse Communication: Mobility status;Patient  requests pain meds  Activity Tolerance: Patient tolerated treatment well Patient left: in bed;with call bell/phone within reach;with bed alarm set  OT Visit Diagnosis: Muscle weakness (generalized) (M62.81)                Time: 5573-2202 OT Time Calculation (min): 18 min Charges:  OT General Charges $OT  Visit: 1 Visit OT Evaluation $OT Eval Moderate Complexity: 1 Mod  Derenda Mis, OTR/L Acute Rehabilitation Services Office [phone] Secure Chat Communication Preferred   Donia Pounds 01/07/2023, 3:52 PM

## 2023-01-07 NOTE — H&P (Addendum)
History and Physical  [name] RJJ:[id number] DOB: October 23, 1970 DOA: 01/06/2023  Referring physician: Dr. Madilyn Hook, EDP  PCP: Marcine Matar, MD  Outpatient Specialists: Cardiology Patient coming from: Home  Chief Complaint: Legs swelling  HPI: [name] is a 52 y.o. female with medical history significant for chronic diastolic CHF, COPD, history of DVT on Eliquis, type 2 diabetes, diabetic polyneuropathy on gabapentin, recent admission for dizziness (11/25/22-11/30/22-Home p.o. Lasix 80 mg daily was held), discharged to SNF, and from there, recently returned home where she lives with her elderly mother, who presents to the ED via EMS due to bilateral lower extremity edema all the way up to her abdomen.    She gained about 50 pounds in less than a month.  Dry weight 278 pounds and currently weighs about 332 pounds.  No anginal symptoms reported.  Since returning home from SNF, she rarely got out of bed due to her excessive peripheral edema and diabetic polyneuropathy.  In the ED, noted to be volume overload.  IV Lasix was initiated.  Chest x-ray revealed rounded 3.1 cm density along the paramediastinal border on the right concerning for mass, CT is recommended for further evaluation.  No active disease.  The heart is enlarged.  CT chest is pending.  Admitted by [facility], hospitalist service.  ED Course: Temperature 98.5.  BP 131/78, pulse 93, respiratory rate 18, saturation 100% on room air.  Lab studies notable for serum glucose of 123.  BUN 9, creatinine 0.72.  BNP 43, troponin 4.  Hemoglobin 10.1.  Review of Systems: Review of systems as noted in the HPI. All other systems reviewed and are negative.   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   Morbid obesity due to excess calories (HCC)    OSA (obstructive sleep apnea) 07/11/2021   Sleep study April 06, 2019 (Dr. Craige Cotta): Moderate OSA (AHI of 24.4 and SPO2 low of 77%).  Majority events during REM sleep.-recommend CPAP, oral appliance or surgical. Reviewed sleep hygiene.  Avoid sedatives.     Osteoarthritis of left knee    Prediabetes    Small bowel obstruction (HCC)    as a child   Speech impediment    Stutter / stammer   Past Surgical History:  Procedure Laterality Date   ABDOMINAL WALL DEFECT REPAIR  1970   IR CV LINE INJECTION  10/24/2020   IR REMOVAL TUN ACCESS W/ PORT W/O FL MOD SED  05/28/2021   IVC FILTER INSERTION  2017   Lower Extremity Venous Duplex  06/23/2020   No  evidence of DVT or superficial thrombosis bilaterally.  No evidence of deep venous insufficiency bilaterally.  No evidence of SSV reflux.  Right GSV in the calf has reflux, no reflux in L GSV.;  Repeated in July 2020-no DVT   OOPHORECTOMY  1996   OOPHORECTOMY  1997   PORTACATH PLACEMENT  2014   TRANSTHORACIC ECHOCARDIOGRAM  05/18/2021   EF 60 to 65%.  No RWMA.  Mild concentric LVH.  "Normal diastolic parameters ".  Normal longitudinal strain.  Normal PAP, RAP.  Normal aortic and mitral valves.==> In July 2022, echo read as  GR 1 DD otherwise stable.    Social History:  reports that she quit smoking about 20 years ago. Her smoking use included cigarettes. She started smoking about 38 years ago. She has a 4.5 pack-year smoking history. She has never used smokeless tobacco. She reports that she does not currently use alcohol. She reports that she does not currently use drugs.   Allergies  Allergen Reactions   Ace Inhibitors Rash and Other (See Comments)    Make pt bleed   Aspirin Other (See Comments)    Per patient paperwork: blood clot?  Likely because of chronic DOAC   Hydromorphone Hives and Itching   Vancomycin Itching and Rash   Contrast Media [Iodinated Contrast Media] Hives   Dilaudid [Hydromorphone Hcl] Hives   Lidocaine Itching    Itching with Lidocaine patch reported 06/13/2021 vis telephone message.    Family History  Problem Relation Age of Onset   Diabetes Mellitus II Mother    COPD Father    Diabetes Father    Colon cancer Father    Diabetes Mellitus II Maternal Grandmother    Breast cancer Paternal Grandfather    Liver disease Neg Hx    Esophageal cancer Neg Hx       Prior to Admission medications   Medication Sig Start Date End Date Taking? Authorizing Provider  acetaminophen (TYLENOL) 500 MG tablet Take 1 tablet (500 mg total) by mouth every 6 (six) hours as needed. 04/19/20  Yes Marcine Matar, MD  amLODipine (NORVASC) 5 MG tablet Take 5 mg by mouth daily. 12/12/22  Yes [provider]  atorvastatin (LIPITOR) 40 MG tablet Take 1 tablet (40 mg total) by mouth daily. 11/22/21  Yes Marcine Matar, MD  butalbital-acetaminophen-caffeine (FIORICET) [phone] MG tablet Take 1 tablet by mouth every 6 (six) hours as needed for headache. 11/30/22  Yes Amin, Ankit C, MD  cholecalciferol (VITAMIN D3) 25 MCG (1000 UNIT) tablet Take 1 tablet (1,000 Units total) by mouth daily. 04/07/20  Yes Marcine Matar, MD  diazepam (VALIUM) 2 MG tablet Take 1 tablet (2 mg total) by mouth  every 8 (eight) hours as needed (dizziness). 11/30/22  Yes Amin, Ankit C, MD  docusate sodium (COLACE) 100 MG capsule Take 1 capsule (100 mg total) by mouth 2 (two) times daily as needed for mild constipation. 11/30/22  Yes Amin, Ankit C, MD  DULERA 200-5 MCG/ACT AERO INHALE 2 PUFFS EVERY MORNING AND 2 PUFFS EVERY NIGHT AT BEDTIME Patient taking differently: Inhale 2 puffs into the lungs See admin instructions. INHALE 2 PUFFS EVERY MORNING AND 2 PUFFS EVERY NIGHT AT BEDTIME 06/04/22  Yes Marcine Matar, MD  ELIQUIS 5 MG TABS tablet TAKE 1 TABLET BY MOUTH TWICE A DAY 07/02/22  Yes Marcine Matar, MD  FARXIGA 10 MG TABS tablet TAKE 1 TABLET BY MOUTH DAILY 12/12/22  Yes Marcine Matar, MD  FEROSUL 325 [phone]  Fe) MG tablet TAKE ONE TABLET BY MOUTH DAILY WITH BREAKFAST 06/06/21  Yes Marcine Matar, MD  furosemide (LASIX) 80 MG tablet Take 80 mg by mouth daily. 12/12/22  Yes [provider]  gabapentin (NEURONTIN) 300 MG capsule Take 1 capsule (300 mg total) by mouth 3 (three) times daily. 12/18/22  Yes Gerhard Munch, MD  hydrOXYzine (VISTARIL) 25 MG capsule Take 1 capsule (25 mg total) by mouth at bedtime as needed. Patient taking differently: Take 25 mg by mouth at bedtime as needed for anxiety or itching. 04/20/21  Yes Marcine Matar, MD  meclizine (ANTIVERT) 25 MG tablet Take 1 tablet (25 mg total) by mouth 3 (three) times daily as needed for dizziness. 11/30/22  Yes Amin, Ankit C, MD  nitroGLYCERIN (NITROSTAT) 0.4 MG SL tablet Place 1 tablet (0.4 mg total) under the tongue every 5 (five) minutes as needed for chest pain. 11/22/21  Yes Marcine Matar, MD  oxyCODONE (OXY IR/ROXICODONE) 5 MG immediate release tablet Take 1 tablet (5 mg total) by mouth every 6 (six) hours as needed for severe pain. 11/30/22  Yes Amin, Ankit C, MD  PARoxetine (PAXIL) 20 MG tablet TAKE 1 TABLET BY MOUTH DAILY 11/29/22  Yes Marcine Matar, MD  Semaglutide, 1 MG/DOSE, 4 MG/3ML SOPN Inject 1 mg as directed once  a week. Patient taking differently: Inject 1 mg as directed once a week. Patient takes on Wednesdays 11/22/22  Yes Marcine Matar, MD  vitamin B-12 (CYANOCOBALAMIN) 1000 MCG tablet Take 1,000 mcg by mouth daily.   Yes [provider]  diclofenac Sodium (VOLTAREN) 1 % GEL Apply 2 g topically 4 (four) times daily. 05/07/22   Marcine Matar, MD  hydrocortisone cream 1 % Apply 1 application topically daily as needed for itching.    [provider]    Physical Exam: BP 131/78   Pulse 93   Temp 98.5 F (36.9 C) (Oral)   Resp 19   Ht 5\' 3"  (1.6 m)   Wt (!) 150.9 kg   SpO2 100%   BMI 58.93 kg/m   General: 52 y.o. year-old female well developed well nourished in no acute distress.  Alert and oriented x3. Cardiovascular: Tachycardia with no rubs or gallops.  No thyromegaly or JVD noted.  3+ pitting edema in lower extremities bilaterally.   Respiratory: Clear to auscultation with no wheezes or rales. Good inspiratory effort. Abdomen: Soft nontender nondistended with normal bowel sounds x4 quadrants. Muskuloskeletal: No cyanosis or clubbing noted bilaterally Neuro: CN II-XII intact, strength, sensation, reflexes Skin: No ulcerative lesions noted or rashes Psychiatry: Mood is appropriate for condition and setting          Labs on Admission:  Basic Metabolic Panel: Recent Labs  Lab 01/06/23 1642  NA 138  K 3.6  CL 98  CO2 29  GLUCOSE 123*  BUN 9  CREATININE 0.72  CALCIUM 9.5   Liver Function Tests: No results for input(s): "AST", "ALT", "ALKPHOS", "BILITOT", "PROT", "ALBUMIN" in the last 168 hours. No results for input(s): "LIPASE", "AMYLASE" in the last 168 hours. No results for input(s): "AMMONIA" in the last 168 hours. CBC: Recent Labs  Lab 01/06/23 1642  WBC 5.8  NEUTROABS 4.7  HGB 10.1*  HCT 35.4*  MCV 94.1  PLT 174   Cardiac Enzymes: No results for input(s): "CKTOTAL", "CKMB", "CKMBINDEX", "TROPONINI" in the last 168 hours.  BNP (last 3  results) Recent Labs    03/05/22 1618 12/18/22 1212 01/06/23 2354  BNP  68.2 26.0 43.7    ProBNP (last 3 results) No results for input(s): "PROBNP" in the last 8760 hours.  CBG: No results for input(s): "GLUCAP" in the last 168 hours.  Radiological Exams on Admission: DG Chest 2 View  Result Date: 01/07/2023 CLINICAL DATA:  Shortness of breath, edema, and cellulitis of both legs. CHF. EXAM: CHEST - 2 VIEW COMPARISON:  03/05/2022. FINDINGS: The heart is enlarged. Atherosclerotic calcification of the aorta is noted. There is a rounded 3.1 cm density in the paramediastinal border on the right measuring up to 3.1 cm. Lung volumes are low. No consolidation, effusion, or pneumothorax. No acute osseous abnormality. IMPRESSION: 1. Rounded 3.1 cm density along the paramediastinal border on the right, concerning for mass. CT is recommended for further evaluation. 2. No active disease. Electronically Signed   By: Thornell Sartorius M.D.   On: 01/07/2023 02:05    EKG: I independently viewed the EKG done and my findings are as followed: None available at the time of this visit.  Assessment/Plan Present on Admission:  Volume overload  Principal Problem:   Volume overload  Volume overload Interruption in diuretic regimen IV diuresis initiated in the ED, continue. Last 2D echo done on 11/27/2022 revealed LVEF 6065% with no regional wall motion abnormalities. Start strict I's and O's and daily weight.  Type 2 diabetes with hyperlipidemia Last hemoglobin A1c 6.5 on 11/24/2020 Obtain hemoglobin A1c  Diabetic polyneuropathy Resume home gabapentin  Anemia of chronic disease Hemoglobin 10.1 No overt bleeding reported. Resume home iron supplement  COPD Resume home regimen  Hyperlipidemia Resume home statin  Chronic anxiety/depression Resume home Paxil  History of DVT Resume home Eliquis  Incidental finding: 3.1 cm density along the paramediastinal border on the right concerning for  mass Follow CT scan  Generalized weakness PT OT assessment Fall precautions.   Time: 75 minutes.   DVT prophylaxis: Home Eliquis  Code Status: Full code as stated by the patient herself.  Family Communication: None at bedside.  Disposition Plan: Admitted to telemetry unit  Consults called: None.  Admission status: Inpatient status.   Status is: Inpatient The patient requires at least 2 midnights for further evaluation and treatment of present condition.   Darlin Drop MD [facility] Pager [phone]  If 7PM-7AM, please contact night-coverage [email or web address] Password [email or web address]  01/07/2023, 4:14 AM

## 2023-01-07 NOTE — ED Notes (Signed)
..ED TO INPATIENT HANDOFF REPORT  Name/Age/Gender [name] 52 y.o. female  Code Status Code Status History     Date Active Date Inactive Code Status Order ID Comments User Context   11/26/2022 0914 11/30/2022 1656 Full Code [id number]  Megan Blue, MD ED   11/26/2022 0425 11/26/2022 0914 Full Code [id number]  [name], [name], DO ED   03/06/2022 1528 03/11/2022 1713 Full Code [id number]  [name], [name], MD Inpatient   05/26/2021 0122 06/01/2021 2227 Full Code [id number]  Megan Jungling, DO ED   11/23/2020 0001 11/27/2020 1941 Full Code [id number]  [name], [name], MD ED    Questions for Most Recent Historical Code Status (Order [id number])     Question Answer   By: Consent: discussion documented in EHR            Home/SNF/Other Home  Chief Complaint Volume overload [E87.70]  Level of Care/Admitting Diagnosis ED Disposition     ED Disposition  Admit   Condition  --   Comment  Hospital Area: [facility] [[id number]]  Level of Care: Telemetry [5]  Admit to tele based on following criteria: Monitor for Ischemic changes  May admit patient to [facility] or [facility] if equivalent level of care is available:: Yes  Covid Evaluation: Asymptomatic - no recent exposure (last 10 days) testing not required  Diagnosis: Volume overload [536644]  Admitting Physician: Darlin Drop [[id number]]  Attending Physician: Darlin Drop [[id number]]  Certification:: I certify this patient will need inpatient services for at least 2 midnights  Expected Medical Readiness: 01/09/2023          Medical History Past Medical History:  Diagnosis Date   (HFpEF) heart failure with preserved ejection fraction (HCC) -> although echo suggests normal diastolic parameters with normal left atrial size 04/13/2020   Arthritis    Cellulitis    COPD (chronic obstructive pulmonary disease) (HCC)    Coronary artery disease, non-occlusive    OBSTRUCTIVE: PT STATES - HAD A  CARDIAC CATH - NOT TOLD SHE HAD CAD -> week note from [place] indicates history of MI (patient cannot corroborate   Diabetes mellitus without complication (HCC)    DVT (deep venous thrombosis) (HCC) 09/17/2017   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   Morbid obesity due to excess calories (HCC)    OSA (obstructive sleep apnea) 07/11/2021   Sleep study April 06, 2019 (Dr. Craige Cotta): Moderate OSA (AHI of 24.4 and SPO2 low of 77%).  Majority events during REM sleep.-recommend CPAP, oral appliance or surgical. Reviewed sleep hygiene.  Avoid sedatives.     Osteoarthritis of left knee    Prediabetes    Small bowel obstruction (HCC)    as a child   Speech impediment    Stutter / stammer    Allergies Allergies  Allergen Reactions   Ace Inhibitors Rash and Other (See Comments)    Make pt bleed   Aspirin Other (See Comments)    Per patient paperwork: blood clot?  Likely because of chronic DOAC   Hydromorphone Hives and Itching   Vancomycin Itching and Rash   Contrast  Media [Iodinated Contrast Media] Hives   Dilaudid [Hydromorphone Hcl] Hives   Lidocaine Itching    Itching with Lidocaine patch reported 06/13/2021 vis telephone message.    IV Location/Drains/Wounds Patient Lines/Drains/Airways Status     Active Line/Drains/Airways     Name Placement date Placement time Site Days   Peripheral IV 01/07/23 20 G Left Antecubital 01/07/23  0058  Antecubital  less than 1            Labs/Imaging Results for orders placed or performed during the hospital encounter of 01/06/23 (from the past 48 hour(s))  CBC with Differential     Status:  Abnormal   Collection Time: 01/06/23  4:42 PM  Result Value Ref Range   WBC 5.8 4.0 - 10.5 K/uL   RBC 3.76 (L) 3.87 - 5.11 MIL/uL   Hemoglobin 10.1 (L) 12.0 - 15.0 g/dL   HCT 56.3 (L) 87.5 - 64.3 %   MCV 94.1 80.0 - 100.0 fL   MCH 26.9 26.0 - 34.0 pg   MCHC 28.5 (L) 30.0 - 36.0 g/dL   RDW 32.9 51.8 - 84.1 %   Platelets 174 150 - 400 K/uL    Comment: SPECIMEN CHECKED FOR CLOTS REPEATED TO VERIFY    nRBC 0.0 0.0 - 0.2 %   Neutrophils Relative % 79 %   Neutro Abs 4.7 1.7 - 7.7 K/uL   Lymphocytes Relative 11 %   Lymphs Abs 0.6 (L) 0.7 - 4.0 K/uL   Monocytes Relative 7 %   Monocytes Absolute 0.4 0.1 - 1.0 K/uL   Eosinophils Relative 2 %   Eosinophils Absolute 0.1 0.0 - 0.5 K/uL   Basophils Relative 1 %   Basophils Absolute 0.0 0.0 - 0.1 K/uL   Immature Granulocytes 0 %   Abs Immature Granulocytes 0.02 0.00 - 0.07 K/uL    Comment: Performed at [facility], [address], [place], [place] [address]  Basic metabolic panel     Status: Abnormal   Collection Time: 01/06/23  4:42 PM  Result Value Ref Range   Sodium 138 135 - 145 mmol/L   Potassium 3.6 3.5 - 5.1 mmol/L   Chloride 98 98 - 111 mmol/L   CO2 29 22 - 32 mmol/L   Glucose, Bld 123 (H) 70 - 99 mg/dL    Comment: Glucose reference range applies only to samples taken after fasting for at least 8 hours.   BUN 9 6 - 20 mg/dL   Creatinine, Ser 0.16 0.44 - 1.00 mg/dL   Calcium 9.5 8.9 - 01.0 mg/dL   GFR, Estimated >93 >23 mL/min    Comment: (NOTE) Calculated using the CKD-EPI Creatinine Equation (2021)    Anion gap 11 5 - 15    Comment: Performed at [facility], [address], [place], [place] [address]  Brain natriuretic peptide     Status: None   Collection Time: 01/06/23 11:54 PM  Result Value Ref Range   B Natriuretic Peptide 43.7 0.0 - 100.0 pg/mL    Comment: Performed at [facility], [address] [place], [place] [address]  Troponin I (High Sensitivity)      Status: None   Collection Time: 01/06/23 11:54 PM  Result Value Ref Range   Troponin I (High Sensitivity) 4 <18 ng/L    Comment: (NOTE) Elevated high sensitivity troponin I (hsTnI) values and significant  changes across serial measurements may suggest ACS but many other  chronic and acute conditions are known to elevate hsTnI  results.  Refer to the "Links" section for chest pain algorithms and additional  guidance. Performed at [facility], [address] [place], [place] [address]    DG Chest 2 View  Result Date: 01/07/2023 CLINICAL DATA:  Shortness of breath, edema, and cellulitis of both legs. CHF. EXAM: CHEST - 2 VIEW COMPARISON:  03/05/2022. FINDINGS: The heart is enlarged. Atherosclerotic calcification of the aorta is noted. There is a rounded 3.1 cm density in the paramediastinal border on the right measuring up to 3.1 cm. Lung volumes are low. No consolidation, effusion, or pneumothorax. No acute osseous abnormality. IMPRESSION: 1. Rounded 3.1 cm density along the paramediastinal border on the right, concerning for mass. CT is recommended for further evaluation. 2. No active disease. Electronically Signed   By: Thornell Sartorius M.D.   On: 01/07/2023 02:05    Pending Labs Unresulted Labs (From admission, onward)    None       Vitals/Pain Today's Vitals   01/06/23 1636 01/06/23 2330 01/07/23 0105 01/07/23 0128  BP: (!) 143/77 131/78    Pulse: 80 93    Resp: 19     Temp: 97.8 F (36.6 C)  98.5 F (36.9 C)   TempSrc: Oral  Oral   SpO2: 100% 100%    Weight:    (!) 150.9 kg  Height:      PainSc:        Isolation Precautions No active isolations  Medications Medications  morphine (PF) 2 MG/ML injection 2 mg (has no administration in time range)  furosemide (LASIX) injection 40 mg (40 mg Intravenous Given 01/07/23 0105)  oxyCODONE (Oxy IR/ROXICODONE) immediate release tablet 5 mg (5 mg Oral Given 01/07/23 0245)    Mobility non-ambulatory

## 2023-01-07 NOTE — Progress Notes (Signed)
Progress Note   Patient: [name] XBJ:[id number] DOB: 1971/01/23 DOA: 01/06/2023     0 DOS: the patient was seen and examined on 01/07/2023   Brief hospital course: 52yo with h/o chronic diastolic CHF, COPD, DVT on Eliquis, DM, and recent admission for dizziness who was discharged off Lasix to SNF who presented on 9/9 with anasarca and 50 pound weigh gain.  She was given IV Lasix.    Assessment and Plan:  Anasarca -Patient with recent hospitalization for dizziness (7/29-8/3) -Norvasc, Lasix, Aldactone, Robaxin, and Coreg were all stopped with plan to resume BP meds as BP and dizziness would tolerate -She was discharged to SNF without home diuretics but they eventually resumed - it looks like Lasix 80 mg daily was given from 8/8-9/9 -She reports marked edema prior to d/c from SNF and worsening inability to ambulate; she also reports that she was not taken out of bed at rehab -His weight is increased from 126.1 kg on 7/30 to 150.95 kg now -Will admit, as she likely needs very judicious diuresis to prevent injury of her kidneys -Will start with Lasix 20 mg IV q12h and closely monitor strict I/O and daily weights as well as daily BMP -She is reluctant to move and needs to be encouraged to get OOB -She currently reports difficulty with getting to bedside commode so will allow Purewick x 24 hours  HTN Hold amlodipine due to risk of worsening edema  Type 2 diabetes with hyperlipidemia Last hemoglobin A1c 6.5 on 11/24/2020 Hold Farxiga Resume home gabapentin   Anemia of chronic disease Hemoglobin 8.6, down from 10.1 No overt bleeding reported. Resume home iron supplement   COPD Continue Dulera   Hyperlipidemia Continue atorvastatin   Chronic anxiety/depression Resume home Paxil, hydroxyzine   History of DVT/chronically occluded IVC Resume home Eliquis   Generalized weakness PT OT assessment Fall precautions  Chronic pain I have reviewed this patient in the [place]  Controlled Substances Reporting System.   No recent reports given prolonged SNF admission Continue Oxy  OSA Continue CPAP  Morbid obesity Body mass index is 58.93 kg/m.[name]  Weight loss should be encouraged on an ongoing basis Hold Semaglutide for now Outpatient PCP/bariatric medicine/bariatric surgery f/u encouraged     Consultants: PT/OT  Procedures: None  Antibiotics: None  30 Day Unplanned Readmission Risk Score    Flowsheet Row ED to Hosp-Admission (Current) from 01/06/2023 in [facility]  30 Day Unplanned Readmission Risk Score (%) 15.8 Filed at 01/07/2023 0801       This score is the patient's risk of an unplanned readmission within 30 days of being discharged (0 -100%). The score is based on dignosis, age, lab data, medications, orders, and past utilization.   Low:  0-14.9   Medium: 15-21.9   High: 22-29.9   Extreme: 30 and above           Subjective: Asking for Purewick, reports that it is too painful for her to get OOB.  Agrees to participate in therapy but notes that she did not get OOB throughout her prolonged SNF stay; she was discharged yesterday.   Objective: Vitals:   01/07/23 0739 01/07/23 0820  BP: 110/74 (!) 150/76  Pulse: 96 98  Resp: 16   Temp: 98.8 F (37.1 C) 98 F (36.7 C)  SpO2: 100% 100%    Intake/Output Summary (Last 24 hours) at 01/07/2023 1618 Last data filed at 01/07/2023 0743 Gross per 24 hour  Intake --  Output 1000 ml  Net -1000 ml   Filed Weights   01/06/23 1635 01/07/23 0128  Weight: 127 kg (!) 150.9 kg    Exam:  General:  Appears calm and comfortable and is in NAD, sedentary Eyes:  EOMI, normal lids, iris ENT:  grossly normal hearing, lips & tongue, mmm Neck:  no LAD, masses or thyromegaly Cardiovascular:  RRR, no m/r/g. 2-3+ LE edema.  Respiratory:   CTA bilaterally with no wheezes/rales/rhonchi.  Normal respiratory effort. Abdomen:  soft, NT, ND, +lower abdominal wall edema Skin:  no rash or  induration seen on limited exam Musculoskeletal:  poor tone BUE/BLE, no bony abnormality Psychiatric:  blunted mood and affect, speech fluent and appropriate, AOx3 Neurologic:  CN 2-12 grossly intact, moves all extremities in coordinated fashion  Data Reviewed: I have reviewed the patient's lab results since admission.  Pertinent labs for today include:   K+ 3.3 WBC 5.4 Hgb 8.6, down from 10.1     Family Communication: None present  Disposition: Status is: Inpatient Remains inpatient appropriate because: ongoing need for IV diuresis     Time spent: 50 minutes  Unresulted Labs (From admission, onward)     Start     Ordered   01/07/23 0500  Basic metabolic panel  Daily,   R      01/07/23 0454   01/07/23 0500  Magnesium  Daily,   R      01/07/23 0454   Unscheduled  CBC with Differential/Platelet  Tomorrow morning,   R        01/07/23 1617             Author: [name], MD 01/07/2023 4:18 PM  For on call review [email or web address].

## 2023-01-08 DIAGNOSIS — Z95828 Presence of other vascular implants and grafts: Secondary | ICD-10-CM

## 2023-01-08 DIAGNOSIS — I10 Essential (primary) hypertension: Secondary | ICD-10-CM

## 2023-01-08 DIAGNOSIS — G4733 Obstructive sleep apnea (adult) (pediatric): Secondary | ICD-10-CM

## 2023-01-08 DIAGNOSIS — Z6841 Body Mass Index (BMI) 40.0 and over, adult: Secondary | ICD-10-CM

## 2023-01-08 DIAGNOSIS — D649 Anemia, unspecified: Secondary | ICD-10-CM | POA: Diagnosis not present

## 2023-01-08 DIAGNOSIS — Z86718 Personal history of other venous thrombosis and embolism: Secondary | ICD-10-CM

## 2023-01-08 DIAGNOSIS — I890 Lymphedema, not elsewhere classified: Secondary | ICD-10-CM

## 2023-01-08 DIAGNOSIS — Z9189 Other specified personal risk factors, not elsewhere classified: Secondary | ICD-10-CM

## 2023-01-08 DIAGNOSIS — R601 Generalized edema: Secondary | ICD-10-CM | POA: Diagnosis not present

## 2023-01-08 LAB — CBC WITH DIFFERENTIAL/PLATELET
Abs Immature Granulocytes: 0.02 10*3/uL (ref 0.00–0.07)
Basophils Absolute: 0 10*3/uL (ref 0.0–0.1)
Basophils Relative: 1 %
Eosinophils Absolute: 0.1 10*3/uL (ref 0.0–0.5)
Eosinophils Relative: 2 %
HCT: 32 % — ABNORMAL LOW (ref 36.0–46.0)
Hemoglobin: 9.2 g/dL — ABNORMAL LOW (ref 12.0–15.0)
Immature Granulocytes: 1 %
Lymphocytes Relative: 15 %
Lymphs Abs: 0.6 10*3/uL — ABNORMAL LOW (ref 0.7–4.0)
MCH: 26.7 pg (ref 26.0–34.0)
MCHC: 28.8 g/dL — ABNORMAL LOW (ref 30.0–36.0)
MCV: 93 fL (ref 80.0–100.0)
Monocytes Absolute: 0.4 10*3/uL (ref 0.1–1.0)
Monocytes Relative: 10 %
Neutro Abs: 3.1 10*3/uL (ref 1.7–7.7)
Neutrophils Relative %: 71 %
Platelets: 165 10*3/uL (ref 150–400)
RBC: 3.44 MIL/uL — ABNORMAL LOW (ref 3.87–5.11)
RDW: 15.3 % (ref 11.5–15.5)
WBC: 4.2 10*3/uL (ref 4.0–10.5)
nRBC: 0 % (ref 0.0–0.2)

## 2023-01-08 LAB — BASIC METABOLIC PANEL
Anion gap: 7 (ref 5–15)
BUN: 7 mg/dL (ref 6–20)
CO2: 27 mmol/L (ref 22–32)
Calcium: 8.8 mg/dL — ABNORMAL LOW (ref 8.9–10.3)
Chloride: 101 mmol/L (ref 98–111)
Creatinine, Ser: 0.69 mg/dL (ref 0.44–1.00)
GFR, Estimated: >60 mL/min (ref >60)
Glucose, Bld: 97 mg/dL (ref 70–99)
Potassium: 4 mmol/L (ref 3.5–5.1)
Sodium: 135 mmol/L (ref 135–145)

## 2023-01-08 LAB — GLUCOSE, CAPILLARY
Glucose-Capillary: 116 mg/dL — ABNORMAL HIGH (ref 70–99)
Glucose-Capillary: 118 mg/dL — ABNORMAL HIGH (ref 70–99)

## 2023-01-08 LAB — MAGNESIUM: Magnesium: 1.9 mg/dL (ref 1.7–2.4)

## 2023-01-08 MED ORDER — FUROSEMIDE 10 MG/ML IJ SOLN
20.0000 mg | Freq: Three times a day (TID) | INTRAMUSCULAR | Status: DC
  Administered 2023-01-08 – 2023-01-10 (×8): 20 mg via INTRAVENOUS
  Filled 2023-01-08 (×8): qty 2

## 2023-01-08 MED ORDER — POTASSIUM CHLORIDE CRYS ER 20 MEQ PO TBCR
20.0000 meq | EXTENDED_RELEASE_TABLET | Freq: Two times a day (BID) | ORAL | Status: AC
  Administered 2023-01-08 – 2023-01-09 (×2): 20 meq via ORAL
  Filled 2023-01-08 (×2): qty 1

## 2023-01-08 MED ORDER — INSULIN ASPART 100 UNIT/ML IJ SOLN: 0.0000 [IU] | Freq: Every day | INTRAMUSCULAR | Status: DC

## 2023-01-08 MED ORDER — INSULIN ASPART 100 UNIT/ML IJ SOLN
0.0000 [IU] | Freq: Three times a day (TID) | INTRAMUSCULAR | Status: DC
  Administered 2023-01-09 – 2023-01-10 (×2): 3 [IU] via SUBCUTANEOUS
  Administered 2023-01-12: 2 [IU] via SUBCUTANEOUS
  Administered 2023-01-13: 3 [IU] via SUBCUTANEOUS
  Administered 2023-01-14 – 2023-01-17 (×3): 2 [IU] via SUBCUTANEOUS

## 2023-01-08 NOTE — Assessment & Plan Note (Addendum)
Pt with obvious lack of motivation to help herself with her chronic diseases. She needs encouragement to get out of bed and to use bedside commode to urinate. Do NOT re-order Purewick catheter no matter how much pt begs for it. Do not insert foley. Pt needs to use either bedpan or bedside commode(preferably)

## 2023-01-08 NOTE — Assessment & Plan Note (Signed)
Stable. Continue with lasix. Holding norvasc due to borderline BP.

## 2023-01-08 NOTE — Assessment & Plan Note (Signed)
Chronic. On paxil 20 mg daily.

## 2023-01-08 NOTE — Assessment & Plan Note (Signed)
Stable. Add SSI.

## 2023-01-08 NOTE — Assessment & Plan Note (Signed)
On chronic Eliquis due to hx of DVT and IVC filter.

## 2023-01-08 NOTE — Plan of Care (Signed)

## 2023-01-08 NOTE — Evaluation (Signed)
Physical Therapy Evaluation Patient Details Name: [name] MRN: [id number] DOB: 11/04/1970 Today's Date: 01/08/2023  History of Present Illness  [name] is a 52 y.o. female presented with volume overload. She had recent admission for dizziness (11/25/22-11/30/22), discharged to SNF, and from there, recently returned home where she lives with her elderly mother and now presents to the ED via EMS due to bilateral lower extremity edema all the way up to her abdomen. Since returning home from SNF, she rarely got out of bed due to her excessive peripheral edema and diabetic polyneuropathy. PHMx: chronic diastolic CHF, COPD, history of DVT on Eliquis, type 2 diabetes, diabetic polyneuropathy on gabapentin.  Clinical Impression  Patient presents with decreased mobility due to LE pain, generalized weakness, decreased balance, decreased activity tolerance and pain in LE's.  She was independent prior to admission in July, but since has continued to deteriorate despite stay at [facility].  She needs min to mod A for in room ambulation due to pain and R knee buckling.  She will benefit from skilled PT in the acute setting and from follow up inpatient rehab >3 hours/day to allow return home with supervisory assistance.        If plan is discharge home, recommend the following: A little help with walking and/or transfers;Assistance with cooking/housework;Help with stairs or ramp for entrance;A little help with bathing/dressing/bathroom;Assist for transportation   Can travel by private vehicle   Yes    Equipment Recommendations    Recommendations for Other Services  Rehab consult    Functional Status Assessment Patient has had a recent decline in their functional status and demonstrates the ability to make significant improvements in function in a reasonable and predictable amount of time.     Precautions / Restrictions Precautions Precautions: Fall      Mobility  Bed  Mobility Overal bed mobility: Needs Assistance Bed Mobility: Supine to Sit, Sit to Supine     Supine to sit: Supervision, Used rails, HOB elevated Sit to supine: Min assist   General bed mobility comments: assist for legs into bed    Transfers Overall transfer level: Needs assistance Equipment used: Rolling walker (2 wheels) Transfers: Sit to/from Stand Sit to Stand: Min assist           General transfer comment: min A for balance and increased time using momentum    Ambulation/Gait Ambulation/Gait assistance: Min assist, Mod assist Gait Distance (Feet): 20 Feet Assistive device: Rolling walker (2 wheels) Gait Pattern/deviations: Step-to pattern, Decreased stride length, Antalgic, Wide base of support       General Gait Details: R knee buckling and painful response with weight bearing, assist for balance, to support for fall prevention and for encouragement  Stairs            Wheelchair Mobility     Tilt Bed    Modified Rankin (Stroke Patients Only)       Balance Overall balance assessment: Needs assistance Sitting-balance support: Feet supported Sitting balance-Leahy Scale: Good     Standing balance support: Reliant on assistive device for balance, Bilateral upper extremity supported Standing balance-Leahy Scale: Poor                               Pertinent Vitals/Pain Pain Assessment Faces Pain Scale: Hurts whole lot Pain Location: R>L LE Pain Descriptors / Indicators: Discomfort, Aching, Grimacing, Guarding Pain Intervention(s): Monitored during session, Repositioned, Limited activity within patient's tolerance  Home Living Family/patient expects to be discharged to:: Private residence Living Arrangements: Parent Available Help at Discharge: Family;Available 24 hours/day Type of Home: Apartment Home Access: Level entry       Home Layout: One level Home Equipment: Agricultural consultant (2 wheels);Cane - single point      Prior  Function Prior Level of Function : Independent/Modified Independent             Mobility Comments: prior to last hospitalization she was ambulatory with SPC. since d/c from SNF she was nearly bed bound       Extremity/Trunk Assessment   Upper Extremity Assessment Upper Extremity Assessment: Defer to OT evaluation    Lower Extremity Assessment Lower Extremity Assessment: RLE deficits/detail;LLE deficits/detail RLE Deficits / Details: AAROM limited knee flexion about 70 in supine with pain and soft tissue approximation, more available during sit to stand; strength hip flexion 3/5, knee extension 4-/5, ankle DF 3+/5 RLE Sensation: decreased light touch LLE Deficits / Details: AAROM limited knee flexion about 70 in supine with pain and soft tissue approximation, more available during sit to stand; strength hip flexion 3/5, knee extension 4-/5, ankle DF 3+/5 LLE Sensation: decreased light touch    Cervical / Trunk Assessment Cervical / Trunk Exceptions: increased body habitus  Communication   Communication Communication: No apparent difficulties  Cognition Arousal: Alert Behavior During Therapy: WFL for tasks assessed/performed, Anxious Overall Cognitive Status: Within Functional Limits for tasks assessed                                 General Comments: fearful of falling        General Comments General comments (skin integrity, edema, etc.): HR 100's in sittin 110's after ambulation; BP 100/73 prior to standing    Exercises     Assessment/Plan    PT Assessment Patient needs continued PT services  PT Problem List Decreased strength;Decreased balance;Decreased mobility;Decreased activity tolerance;Pain       PT Treatment Interventions DME instruction;Functional mobility training;Balance training;Patient/family education;Gait training;Therapeutic exercise;Therapeutic activities    PT Goals (Current goals can be found in the Care Plan section)  Acute  Rehab PT Goals Patient Stated Goal: return to independent PT Goal Formulation: With patient Time For Goal Achievement: 01/22/23 Potential to Achieve Goals: Good    Frequency Min 1X/week     Co-evaluation               AM-PAC PT "6 Clicks" Mobility  Outcome Measure Help needed turning from your back to your side while in a flat bed without using bedrails?: A Little Help needed moving from lying on your back to sitting on the side of a flat bed without using bedrails?: A Little Help needed moving to and from a bed to a chair (including a wheelchair)?: A Little Help needed standing up from a chair using your arms (e.g., wheelchair or bedside chair)?: A Little Help needed to walk in hospital room?: A Lot Help needed climbing 3-5 steps with a railing? : Total 6 Click Score: 15    End of Session Equipment Utilized During Treatment: Gait belt Activity Tolerance: Patient limited by pain Patient left: in bed;with call bell/phone within reach   PT Visit Diagnosis: Muscle weakness (generalized) (M62.81);Other abnormalities of gait and mobility (R26.89);History of falling (Z91.81)    Time: 1610-9604 PT Time Calculation (min) (ACUTE ONLY): 28 min   Charges:   PT Evaluation $PT Eval Moderate Complexity: 1  Mod PT Treatments $Gait Training: 8-22 mins PT General Charges $$ ACUTE PT VISIT: 1 Visit         Sheran Lawless, PT Acute Rehabilitation Services Office:[phone] 01/08/2023   Elray Mcgregor 01/08/2023, 5:38 PM

## 2023-01-08 NOTE — Assessment & Plan Note (Signed)
Chronic. 

## 2023-01-08 NOTE — Assessment & Plan Note (Addendum)
BMI 56.08. pt is not a CIR candidate due to poor social support after discharge. Pt will need SNF placement. CM aware.

## 2023-01-08 NOTE — Assessment & Plan Note (Addendum)
Chronic lower extremity. Mostly in her thighs as opposed to her lower legs/feet. Improving with diuresis.

## 2023-01-08 NOTE — Progress Notes (Signed)
? ?  Inpatient Rehab Admissions Coordinator : ? ?Per therapy recommendations, patient was screened for CIR candidacy by Barbara Boyette RN MSN.  At this time patient appears to be a potential candidate for CIR. I will place a rehab consult per protocol for full assessment. Please call me with any questions. ? ?Barbara Boyette RN MSN ?Admissions Coordinator ?[phone] ?  ?

## 2023-01-08 NOTE — Progress Notes (Addendum)
PROGRESS NOTE    [name]  JYN:[id number] DOB: January 07, 1971 DOA: 01/06/2023 PCP: Marcine Matar, MD  Subjective: No new subjective & objective note has been filed under this hospital service since the last note was generated.    Hospital Course: HPI: [name] is a 52 y.o. female with medical history significant for chronic diastolic CHF, COPD, history of DVT on Eliquis, type 2 diabetes, diabetic polyneuropathy on gabapentin, recent admission for dizziness (11/25/22-11/30/22-Home p.o. Lasix 80 mg daily was held), discharged to SNF, and from there, recently returned home where she lives with her elderly mother, who presents to the ED via EMS due to bilateral lower extremity edema all the way up to her abdomen.     She gained about 50 pounds in less than a month.  Dry weight 278 pounds and currently weighs about 332 pounds.  No anginal symptoms reported.  Since returning home from SNF, she rarely got out of bed due to her excessive peripheral edema and diabetic polyneuropathy.   In the ED, noted to be volume overload.  IV Lasix was initiated.  Chest x-ray revealed rounded 3.1 cm density along the paramediastinal border on the right concerning for mass, CT is recommended for further evaluation.  No active disease.  The heart is enlarged.  CT chest is pending.   Admitted by [facility], hospitalist service.  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  Antibiotic Therapy:   Procedures:   Consultants:     Assessment and Plan: * Anasarca Pt with lower extremity anasarca. Difficult to determine how much is lymphedema vs true edema. Increase lasix to 20 mg IV TID. Pt begging to continue purewick. Agreed to another 24 hours of purewick but then it will need to be removed to prevent UTIs. Pt with low motivation to help  herself.  Lymphedema Chronic lower extremity. Mostly in her thighs as opposed to her lower legs/feet  Lack of motivation Pt with obvious lack of motivation to help herself with her chronic diseases. She needs encouragement to get out of bed.  Type 2 diabetes mellitus with morbid obesity (HCC) Stable. Add SSI.  Hyperlipidemia with target LDL less than 100 Stable. On lipitor 40 mg daily.  OSA (obstructive sleep apnea) Chronic.  Depression Chronic. On paxil 20 mg daily.  Morbid obesity with BMI of 50.0-59.9, adult (HCC) BMI 56.08. will likely need SNF placement at discharge.  History of DVT (deep vein thrombosis) On chronic Eliquis due to hx of DVT and IVC filter.  Essential hypertension Stable. Continue with lasix. Holding norvasc due to borderline BP.  Chronic anemia Stable. On oral iron.  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 On eliquis.       DVT prophylaxis:  apixaban (ELIQUIS) tablet 5 mg  Eliquis   Code Status: Full Code Family Communication: no family at bedside Disposition Plan: will likely need SNF vs home with home health Reason for continuing need for hospitalization: continue with IV lasix for diuresis, close monitoring of renal function during diuresis.  Objective: Vitals:   01/08/23 0446 01/08/23 0504 01/08/23 0801 01/08/23 1610  BP: 119/83 119/83 119/84 100/73  Pulse: 89 88 92 96  Resp: 18 18 16 16   Temp: 97.7 F (36.5 C) 97.7 F (36.5 C) 98.2 F (36.8 C) 97.8 F (36.6 C)  TempSrc: Oral Oral Oral Oral  SpO2: 99% 99% 97% 100%  Weight:  (!) 143.6 kg    Height:        Intake/Output Summary (Last 24 hours) at 01/08/2023 1631 Last data filed at 01/08/2023 1000 Gross per 24 hour  Intake 558 ml  Output 2775 ml  Net -2217 ml   Filed Weights   01/06/23 1635 01/07/23 0128 01/08/23 0504  Weight: 127 kg (!) 150.9 kg (!) 143.6 kg    Examination:  Physical Exam Vitals and nursing note reviewed.  Constitutional:       Appearance: She is obese. She is not toxic-appearing or diaphoretic.     Comments: Stutters when she speaks  HENT:     Head: Normocephalic and atraumatic.     Nose: Nose normal.  Eyes:     General: No scleral icterus. Cardiovascular:     Rate and Rhythm: Normal rate and regular rhythm.  Pulmonary:     Effort: Pulmonary effort is normal. No respiratory distress.  Abdominal:     General: There is no distension.     Comments: Morbidly obese  Skin:    General: Skin is warm and dry.     Capillary Refill: Capillary refill takes less than 2 seconds.     Comments: +2 edema, non-pitting of flanks/upper thighs +1 edema, non-pitting of bilateral lower legs and feet  Neurological:     General: No focal deficit present.     Mental Status: She is oriented to person, place, and time.     Data Reviewed: I have personally reviewed following labs and imaging studies  CBC: Recent Labs  Lab 01/06/23 1642 01/07/23 0549 01/08/23 0828  WBC 5.8 5.4 4.2  NEUTROABS 4.7  --  3.1  HGB 10.1* 8.6* 9.2*  HCT 35.4* 29.6* 32.0*  MCV 94.1 93.4 93.0  PLT 174 160 165   Basic Metabolic Panel: Recent Labs  Lab 01/06/23 1642 01/07/23 0549 01/08/23 0828  NA 138 139 135  K 3.6 3.3* 4.0  CL 98 99 101  CO2 29 30 27   GLUCOSE 123* 97 97  BUN 9 8 7   CREATININE 0.72 0.66 0.69  CALCIUM 9.5 9.2 8.8*  MG  --  2.1 1.9   GFR: Estimated Creatinine Clearance: 112.8 mL/min (by C-G formula based on SCr of 0.69 mg/dL).  Radiology Studies: CT CHEST WO CONTRAST  Result Date: 01/07/2023 CLINICAL DATA:  52 year old female with lower extremity edema, cellulitis. Abnormal chest x-rays this morning. EXAM: CT CHEST WITHOUT CONTRAST TECHNIQUE: Multidetector CT imaging of the chest was performed following the standard protocol without IV contrast. RADIATION DOSE REDUCTION: This exam was performed according to the departmental dose-optimization program which includes automated exposure control, adjustment of the mA and/or  kV according to patient size and/or use of iterative reconstruction technique. COMPARISON:  Chest x-ray 0039 hours today.  CTA chest 11/23/2020. FINDINGS: Cardiovascular: Noncontrast exam. Rounded contour along the right mediastinum corresponds to engorged azygous arch on series 2, image 14, not significantly changed compared to the 2022 CTA. And there is also chronic prominence of both the azygous and hemi azygous vein at the level of the diaphragm, unchanged from 2022. Calcified aortic atherosclerosis. Calcified coronary artery atherosclerosis. Vascular patency is not evaluated in the absence of IV contrast. Heart size is within normal limits. No pericardial effusion. Mediastinum/Nodes: Negative. No mediastinal mass or lymphadenopathy on this noncontrast exam. Lungs/Pleura: Stable lung volumes since 2022. Major airways are patent. Mild dependent atelectasis in both lungs. No pleural effusion. No convincing acute pulmonary inflammation. Upper Abdomen: Negative visible noncontrast liver, gallbladder, spleen,  pancreas, adrenal glands, kidneys, and bowel. However, there is evidence that the infrarenal IVC is atretic/occluded (series 2, image 59). Musculoskeletal: Diffuse serpiginous body wall venous collaterals beginning at the level of the diaphragm, especially involving the flanks. These were visible in 2022. chronic T12 anterior endplate deformity with adjacent vacuum disc. No acute or suspicious osseous lesion identified. IMPRESSION: 1. Constellation of findings on this noncontrast exam most consistent with chronically occluded/atretic infrarenal IVC, with bulky bilateral body wall venous collaterals likely communicating with the azygous/hemi azygous system. The rounded contour abnormality on chest x-ray this morning corresponds to chronically dilated azygous vein/arch. 2. Mild pulmonary atelectasis. Aortic Atherosclerosis (ICD10-I70.0). Electronically Signed   By: Odessa Fleming M.D.   On: 01/07/2023 07:51   DG Chest  2 View  Result Date: 01/07/2023 CLINICAL DATA:  Shortness of breath, edema, and cellulitis of both legs. CHF. EXAM: CHEST - 2 VIEW COMPARISON:  03/05/2022. FINDINGS: The heart is enlarged. Atherosclerotic calcification of the aorta is noted. There is a rounded 3.1 cm density in the paramediastinal border on the right measuring up to 3.1 cm. Lung volumes are low. No consolidation, effusion, or pneumothorax. No acute osseous abnormality. IMPRESSION: 1. Rounded 3.1 cm density along the paramediastinal border on the right, concerning for mass. CT is recommended for further evaluation. 2. No active disease. Electronically Signed   By: Thornell Sartorius M.D.   On: 01/07/2023 02:05    Scheduled Meds:  apixaban  5 mg Oral BID   atorvastatin  40 mg Oral Daily   cholecalciferol  1,000 Units Oral Daily   cyanocobalamin  1,000 mcg Oral Daily   ferrous sulfate  325 mg Oral Q breakfast   furosemide  20 mg Intravenous TID   gabapentin  300 mg Oral BID   insulin aspart  0-15 Units Subcutaneous TID WC   insulin aspart  0-5 Units Subcutaneous QHS   mometasone-formoterol  2 puff Inhalation BID   PARoxetine  20 mg Oral Daily   Continuous Infusions:   LOS: 1 day   Time spent: 45 minutes  Carollee Herter, DO  [facility]  01/08/2023, 4:31 PM

## 2023-01-08 NOTE — Assessment & Plan Note (Signed)
Chronic. On eliquis.

## 2023-01-08 NOTE — Assessment & Plan Note (Signed)
Stable. On oral iron.

## 2023-01-08 NOTE — Assessment & Plan Note (Signed)
Stable. On lipitor 40 mg daily.

## 2023-01-08 NOTE — Assessment & Plan Note (Addendum)
Pt with lower extremity anasarca. Difficult to determine how much is lymphedema vs true edema. Remains on lasix 20 mg IV TID. Had discussion with patient yesterday about purewick and how it would be removed today without any further discussion due to risk of UTI. Purewick order canceled.

## 2023-01-09 DIAGNOSIS — E1169 Type 2 diabetes mellitus with other specified complication: Secondary | ICD-10-CM

## 2023-01-09 DIAGNOSIS — I890 Lymphedema, not elsewhere classified: Secondary | ICD-10-CM | POA: Diagnosis not present

## 2023-01-09 DIAGNOSIS — R601 Generalized edema: Secondary | ICD-10-CM | POA: Diagnosis not present

## 2023-01-09 DIAGNOSIS — D649 Anemia, unspecified: Secondary | ICD-10-CM | POA: Diagnosis not present

## 2023-01-09 DIAGNOSIS — I10 Essential (primary) hypertension: Secondary | ICD-10-CM | POA: Diagnosis not present

## 2023-01-09 LAB — GLUCOSE, CAPILLARY
Glucose-Capillary: 100 mg/dL — ABNORMAL HIGH (ref 70–99)
Glucose-Capillary: 128 mg/dL — ABNORMAL HIGH (ref 70–99)
Glucose-Capillary: 101 mg/dL — ABNORMAL HIGH (ref 70–99)
Glucose-Capillary: 134 mg/dL — ABNORMAL HIGH (ref 70–99)

## 2023-01-09 LAB — BASIC METABOLIC PANEL
Anion gap: 8 (ref 5–15)
BUN: 8 mg/dL (ref 6–20)
CO2: 28 mmol/L (ref 22–32)
Calcium: 8.6 mg/dL — ABNORMAL LOW (ref 8.9–10.3)
Chloride: 101 mmol/L (ref 98–111)
Creatinine, Ser: 0.62 mg/dL (ref 0.44–1.00)
GFR, Estimated: >60 mL/min (ref >60)
Glucose, Bld: 103 mg/dL — ABNORMAL HIGH (ref 70–99)
Potassium: 3.6 mmol/L (ref 3.5–5.1)
Sodium: 137 mmol/L (ref 135–145)

## 2023-01-09 LAB — MAGNESIUM: Magnesium: 1.8 mg/dL (ref 1.7–2.4)

## 2023-01-09 LAB — HEMOGLOBIN A1C
Hgb A1c MFr Bld: 5.4 % (ref 4.8–5.6)
Mean Plasma Glucose: 108 mg/dL

## 2023-01-09 MED ORDER — POTASSIUM CHLORIDE CRYS ER 20 MEQ PO TBCR: 20.0000 meq | EXTENDED_RELEASE_TABLET | Freq: Two times a day (BID) | ORAL | Status: DC

## 2023-01-09 MED ORDER — POTASSIUM CHLORIDE CRYS ER 20 MEQ PO TBCR
20.0000 meq | EXTENDED_RELEASE_TABLET | Freq: Two times a day (BID) | ORAL | Status: DC
  Administered 2023-01-09 – 2023-01-10 (×3): 20 meq via ORAL
  Filled 2023-01-09 (×3): qty 1

## 2023-01-09 NOTE — Progress Notes (Signed)
Inpatient Rehab Coordinator Note:  I met with patient again at bedside to discuss CIR recommendations and goals/expectations of CIR stay.  We reviewed 3 hrs/day of therapy, physician follow up, and average length of stay 2 weeks (dependent upon progress) with goals of supervision to min assist at discharge.  She confirms that she does live with her elderly mother who would not be able to provide care for her, and that her brother is in town from [place] but will need to return soon.  She has another sibling in [place] but he is not well and unable to assist.  Based on our conversation I'm not convinced she will have the expected level of support at discharge to facilitate a CIR admission and may need SNF again.  She did agree for me to call her mother/brother and I've left a message with them to discuss caregiver support and options for rehab.    Estill Dooms, PT, DPT Admissions Coordinator [phone] 01/09/23  1:40 PM

## 2023-01-09 NOTE — Progress Notes (Signed)
Physical Therapy Treatment Patient Details Name: [name] MRN: [id number] DOB: [birth year] Today's Date: 01/09/2023   History of Present Illness [name] is a 52 y.o. female presented with volume overload. She had recent admission for dizziness (11/25/22-11/30/22), discharged to SNF, and from there, recently returned home where she lives with her elderly mother and now presents to the ED via EMS due to bilateral lower extremity edema all the way up to her abdomen. Since returning home from SNF, she rarely got out of bed due to her excessive peripheral edema and diabetic polyneuropathy. PHMx: chronic diastolic CHF, COPD, history of DVT on Eliquis, type 2 diabetes, diabetic polyneuropathy on gabapentin.    PT Comments  Pt greeted resting in bed and agreeable to session, however pt continues to be limited by BLE pain. Despite pain, pt motivated and participatory throughout session and verbalizing understanding of importance of time up OOB and continued mobility. Pt continues to require up to mod A during gait as pt with R knee buckling and needing assist to steady throughout with RW for support. Pt agreeable to time up in chair at end of session. Current plan remains appropriate to address deficits and maximize functional independence and decrease caregiver burden. Pt continues to benefit from skilled PT services to progress toward functional mobility goals.      If plan is discharge home, recommend the following: A little help with walking and/or transfers;Assistance with cooking/housework;Help with stairs or ramp for entrance;A little help with bathing/dressing/bathroom;Assist for transportation   Can travel by private vehicle     Yes  Equipment Recommendations  None recommended by PT    Recommendations for Other Services       Precautions / Restrictions Precautions Precautions: Fall Restrictions Weight Bearing Restrictions: No     Mobility  Bed Mobility Overal  bed mobility: Needs Assistance Bed Mobility: Supine to Sit, Sit to Supine     Supine to sit: Supervision, Used rails, HOB elevated     General bed mobility comments: supervision for safety    Transfers Overall transfer level: Needs assistance Equipment used: Rolling walker (2 wheels) Transfers: Sit to/from Stand Sit to Stand: Min assist           General transfer comment: min A for balance and increased time using momentum    Ambulation/Gait Ambulation/Gait assistance: Min assist, Mod assist Gait Distance (Feet): 18 Feet Assistive device: Rolling walker (2 wheels) Gait Pattern/deviations: Step-to pattern, Decreased stride length, Antalgic, Wide base of support Gait velocity: slow     General Gait Details: slow antalgic gait with R knee buckling x1 and painful response with weight bearing, assist for balance, to support for fall prevention and for encouragement   Stairs             Wheelchair Mobility     Tilt Bed    Modified Rankin (Stroke Patients Only)       Balance Overall balance assessment: Needs assistance Sitting-balance support: Feet supported Sitting balance-Leahy Scale: Good     Standing balance support: Reliant on assistive device for balance, Bilateral upper extremity supported Standing balance-Leahy Scale: Poor                              Cognition Arousal: Alert Behavior During Therapy: WFL for tasks assessed/performed, Anxious Overall Cognitive Status: Within Functional Limits for tasks assessed  General Comments: fearful of falling        Exercises General Exercises - Lower Extremity Ankle Circles/Pumps: AROM, Both, 10 reps (emcouraged pt to perform throughout day) Long Arc Quad: Strengthening, Right, Left, 10 reps, Seated (with 2 second hold at end range)    General Comments        Pertinent Vitals/Pain Pain Assessment Pain Assessment: Faces Faces Pain Scale:  Hurts whole lot Pain Location: R>L LE Pain Descriptors / Indicators: Discomfort, Aching, Grimacing, Guarding Pain Intervention(s): Premedicated before session, Monitored during session, Limited activity within patient's tolerance    Home Living                          Prior Function            PT Goals (current goals can now be found in the care plan section) Acute Rehab PT Goals Patient Stated Goal: return to independent PT Goal Formulation: With patient Time For Goal Achievement: 01/22/23 Progress towards PT goals: Progressing toward goals    Frequency    Min 1X/week      PT Plan      Co-evaluation              AM-PAC PT "6 Clicks" Mobility   Outcome Measure  Help needed turning from your back to your side while in a flat bed without using bedrails?: A Little Help needed moving from lying on your back to sitting on the side of a flat bed without using bedrails?: A Little Help needed moving to and from a bed to a chair (including a wheelchair)?: A Little Help needed standing up from a chair using your arms (e.g., wheelchair or bedside chair)?: A Little Help needed to walk in hospital room?: A Lot Help needed climbing 3-5 steps with a railing? : Total 6 Click Score: 15    End of Session   Activity Tolerance: Patient limited by pain Patient left: with call bell/phone within reach;in chair;with chair alarm set Nurse Communication: Mobility status PT Visit Diagnosis: Muscle weakness (generalized) (M62.81);Other abnormalities of gait and mobility (R26.89);History of falling (Z91.81)     Time: 0109-3235 PT Time Calculation (min) (ACUTE ONLY): 23 min  Charges:    $Gait Training: 8-22 mins $Therapeutic Activity: 8-22 mins PT General Charges $$ ACUTE PT VISIT: 1 Visit                     Carley Strickling R. PTA Acute Rehabilitation Services Office: [phone]   Catalina Antigua 01/09/2023, 3:54 PM

## 2023-01-09 NOTE — Subjective & Objective (Addendum)
Stable overnight. Less edema in her legs. I&Os document net negative 3.2 liters since yesterday.  Admit weight 332.67 lbs Weight today 315.92 lbs  Weight has decreased by 16.75 lbs since admit  BUN 9, Scr 0.74 today.

## 2023-01-09 NOTE — [place] FL2 (Signed)
[place] MEDICAID FL2 LEVEL OF CARE FORM     IDENTIFICATION  Patient Name: [name] Birthdate: 01-15-1971 Sex: female Admission Date (Current Location): 01/06/2023  [facility] [place] Number:  Producer, television/film/video and Address:  [facility], [name] [place], [place] [address]      Provider Number: [id number]  Attending Physician Name and Address:  Carollee Herter, DO  Relative Name and Phone Number:  Selen Fess, mother - [phone]    Current Level of Care: Hospital Recommended Level of Care: Skilled Nursing Facility Prior Approval Number:    Date Approved/Denied:   PASRR Number: [id number]  Discharge Plan: SNF    Current Diagnoses: Patient Active Problem List   Diagnosis Date Noted   Lack of motivation 01/08/2023   Anasarca 01/07/2023   Orthostatic dizziness 11/27/2022   Vertigo 11/26/2022   Type 2 diabetes mellitus with morbid obesity (HCC) 09/18/2022   Moderate major depression (HCC) 05/07/2022   Hyperlipidemia with target LDL less than 100 02/17/2022   Pain in thoracic spine 09/25/2021   Low back pain 09/25/2021   OSA (obstructive sleep apnea) 07/11/2021   Depression 05/28/2021   Morbid obesity with BMI of 50.0-59.9, adult (HCC) 05/26/2021   Bilateral lower extremity edema 11/22/2020   History of DVT (deep vein thrombosis) 11/22/2020   Asthma, chronic, moderate persistent, with acute exacerbation 11/22/2020   Lymphedema 06/09/2020   Iron deficiency anemia due to chronic blood loss 05/10/2020   Venous insufficiency 04/13/2020   S/P insertion of IVC (inferior vena caval) filter 04/13/2020   Chronic anemia 04/13/2020   Essential hypertension 04/13/2020   (HFpEF) heart failure with preserved ejection fraction (HCC) -> although echo suggests normal diastolic parameters with normal left atrial size 04/13/2020   CAD (coronary artery disease) 11/17/2019    Orientation RESPIRATION BLADDER Height & Weight     Self,  Time, Situation, Place  Normal External catheter, Incontinent Weight: (!) 316 lb 2.2 oz (143.4 kg) Height:  5\' 3"  (160 cm)  BEHAVIORAL SYMPTOMS/MOOD NEUROLOGICAL BOWEL NUTRITION STATUS      Continent Diet (see discharge summary)  AMBULATORY STATUS COMMUNICATION OF NEEDS Skin   Limited Assist Verbally Normal                       Personal Care Assistance Level of Assistance  Bathing, Feeding, Dressing Bathing Assistance: Limited assistance Feeding assistance: Independent Dressing Assistance: Limited assistance     Functional Limitations Info  Sight, Hearing, Speech Sight Info: Impaired (wears glasses) Hearing Info: Adequate Speech Info: Adequate    SPECIAL CARE FACTORS FREQUENCY  PT (By licensed PT), OT (By licensed OT)     PT Frequency: 5x/week OT Frequency: 5x/week            Contractures Contractures Info: Not present    Additional Factors Info  Code Status, Allergies, Insulin Sliding Scale Code Status Info: FULL Allergies Info: Ace Inhibitors  Aspirin  Hydromorphone  Vancomycin  Contrast Media (Iodinated Contrast Media)  Dilaudid (Hydromorphone Hcl)  Lidocaine Psychotropic Info: Atarax, Paxil, Trazodone Insulin Sliding Scale Info: Dose: 0-15 Units  Freq: 3 times daily with meals Route: Day       Current Medications (01/09/2023):  This is the current hospital active medication list Current Facility-Administered Medications  Medication Dose Route Frequency Provider Last Rate Last Admin   apixaban (ELIQUIS) tablet 5 mg  5 mg Oral BID Dow Adolph N, DO   5 mg at 01/09/23 0921   atorvastatin (  LIPITOR) tablet 40 mg  40 mg Oral Daily Dow Adolph N, DO   40 mg at 01/09/23 0920   butalbital-acetaminophen-caffeine (FIORICET) 50-325-40 MG per tablet 1 tablet  1 tablet Oral Q6H PRN Dow Adolph N, DO   1 tablet at 01/09/23 0920   cholecalciferol (VITAMIN D3) 25 MCG (1000 UNIT) tablet 1,000 Units  1,000 Units Oral Daily Dow Adolph N, DO   1,000 Units at 01/09/23 8469    cyanocobalamin (VITAMIN B12) tablet 1,000 mcg  1,000 mcg Oral Daily Dow Adolph N, DO   1,000 mcg at 01/09/23 0920   diazepam (VALIUM) tablet 2 mg  2 mg Oral Q8H PRN Dow Adolph N, DO       ferrous sulfate tablet 325 mg  325 mg Oral Q breakfast Darlin Drop, DO   325 mg at 01/09/23 0920   furosemide (LASIX) injection 20 mg  20 mg Intravenous TID Carollee Herter, DO   20 mg at 01/09/23 6295   gabapentin (NEURONTIN) capsule 300 mg  300 mg Oral BID Darlin Drop, DO   300 mg at 01/09/23 2841   hydrOXYzine (ATARAX) tablet 25 mg  25 mg Oral QHS PRN Dow Adolph N, DO   25 mg at 01/08/23 2156   insulin aspart (novoLOG) injection 0-15 Units  0-15 Units Subcutaneous TID WC Carollee Herter, DO   3 Units at 01/09/23 1240   insulin aspart (novoLOG) injection 0-5 Units  0-5 Units Subcutaneous QHS Carollee Herter, DO       mometasone-formoterol [facility]) 200-5 MCG/ACT inhaler 2 puff  2 puff Inhalation BID Dow Adolph N, DO   2 puff at 01/09/23 3244   oxyCODONE (Oxy IR/ROXICODONE) immediate release tablet 5 mg  5 mg Oral Q6H PRN [name], MD   5 mg at 01/09/23 0102   PARoxetine (PAXIL) tablet 20 mg  20 mg Oral Daily Dow Adolph N, DO   20 mg at 01/09/23 7253   potassium chloride SA (KLOR-CON M) CR tablet 20 mEq  20 mEq Oral BID Reome, Elisha Headland, RPH         Discharge Medications: Please see discharge summary for a list of discharge medications.  Relevant Imaging Results:  Relevant Lab Results:   Additional Information SSN:[id number]  Elanora Quin A [place], [place]

## 2023-01-09 NOTE — Progress Notes (Signed)
Inpatient Rehab Admissions Coordinator:   Stopped by patient's room to discuss CIR.  Working with therapy on arrival.  Interested in hospital rehab over SNF.  Will see how she does with therapy today and f/u this afternoon.  Will need to confirm adequate caregiver support prior to starting insurance auth request.   Estill Dooms, PT, DPT Admissions Coordinator [phone] 01/09/23  10:41 AM

## 2023-01-09 NOTE — Progress Notes (Signed)
PROGRESS NOTE    [name]  NWG:[id number] DOB: 1971-03-26 DOA: 01/06/2023 PCP: Marcine Matar, MD  Subjective: Arguing again and trying to bargain to keep her purewick. Despite understanding that this is a risk for UTI. Still doesn't want to get OOB or recliner to use bedside commode. Always has an excuse why she can't get up to use commode.  Per I&O sheets, net negative 5.7 liters. Admit weight 332.67 lbs Weight today 316.14 lbs  Weight has decreased by 16.53 lbs   Hospital Course: HPI: [name] is a 52 y.o. female with medical history significant for chronic diastolic CHF, COPD, history of DVT on Eliquis, type 2 diabetes, diabetic polyneuropathy on gabapentin, recent admission for dizziness (11/25/22-11/30/22-Home p.o. Lasix 80 mg daily was held), discharged to SNF, and from there, recently returned home where she lives with her elderly mother, who presents to the ED via EMS due to bilateral lower extremity edema all the way up to her abdomen.     She gained about 50 pounds in less than a month.  Dry weight 278 pounds and currently weighs about 332 pounds.  No anginal symptoms reported.  Since returning home from SNF, she rarely got out of bed due to her excessive peripheral edema and diabetic polyneuropathy.   In the ED, noted to be volume overload.  IV Lasix was initiated.  Chest x-ray revealed rounded 3.1 cm density along the paramediastinal border on the right concerning for mass, CT is recommended for further evaluation.  No active disease.  The heart is enlarged.  CT chest is pending.   Admitted by [facility], hospitalist service.  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  Antibiotic Therapy:   Procedures:   Consultants:     Assessment and Plan: * Anasarca Pt with lower extremity anasarca. Difficult  to determine how much is lymphedema vs true edema. Remains on lasix 20 mg IV TID. Pt begging to continue purewick. Agreed to another 24 hours of purewick but then it will need to be removed to prevent UTIs. Pt with low motivation to help herself. Discussed again with patient that this is the last time we are going to discuss Purewick catheter. Cathter will be removed on 01-10-2023 without fail.  Lymphedema Chronic lower extremity. Mostly in her thighs as opposed to her lower legs/feet  Lack of motivation Pt with obvious lack of motivation to help herself with her chronic diseases. She needs encouragement to get out of bed.  Type 2 diabetes mellitus with morbid obesity (HCC) Stable. Add SSI.  Hyperlipidemia with target LDL less than 100 Stable. On lipitor 40 mg daily.  OSA (obstructive sleep apnea) Chronic.  Depression Chronic. On paxil 20 mg daily.  Morbid obesity with BMI of 50.0-59.9, adult (HCC) BMI 56.08. patient may be a CIR candidate.  History of DVT (deep vein thrombosis) On chronic Eliquis due to hx of DVT and IVC filter.  Essential hypertension Stable. Continue with lasix. Holding norvasc due to borderline BP.  Chronic anemia Stable. On oral iron.  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 On eliquis.   DVT prophylaxis:  apixaban (ELIQUIS) tablet 5 mg     Code Status: Full Code Family Communication: no family at bedside Disposition Plan: CIR vs SNF Reason for continuing need for hospitalization: continue with IV diuresis, monitoring Scr during diuresis.  Objective: Vitals:   01/09/23  1308 01/09/23 0503 01/09/23 0728 01/09/23 1148  BP:  110/64 108/81 105/70  Pulse:  95 89 90  Resp:  18 16 16   Temp:  97.9 F (36.6 C) 98.1 F (36.7 C) (!) 97.3 F (36.3 C)  TempSrc:  Oral Oral Oral  SpO2:  95% 100% 98%  Weight: (!) 143.4 kg     Height:        Intake/Output Summary (Last 24 hours) at 01/09/2023 1331 Last data filed at 01/09/2023  1300 Gross per 24 hour  Intake 240 ml  Output 2800 ml  Net -2560 ml   Filed Weights   01/07/23 0128 01/08/23 0504 01/09/23 0457  Weight: (!) 150.9 kg (!) 143.6 kg (!) 143.4 kg    Examination:  Physical Exam Vitals and nursing note reviewed.  Constitutional:      Appearance: She is obese. She is not toxic-appearing or diaphoretic.     Comments: Stutters when she speaks  HENT:     Head: Normocephalic and atraumatic.     Nose: Nose normal.  Eyes:     General: No scleral icterus. Cardiovascular:     Rate and Rhythm: Normal rate and regular rhythm.  Pulmonary:     Effort: Pulmonary effort is normal. No respiratory distress.  Abdominal:     General: Abdomen is protuberant. There is no distension.     Comments: Morbidly obese  Skin:    General: Skin is warm and dry.     Capillary Refill: Capillary refill takes less than 2 seconds.     Comments: +2 edema, non-pitting of flanks/upper thighs +1 edema, non-pitting of bilateral lower legs and feet  Neurological:     General: No focal deficit present.     Mental Status: She is oriented to person, place, and time.     Data Reviewed: I have personally reviewed following labs and imaging studies  CBC: Recent Labs  Lab 01/06/23 1642 01/07/23 0549 01/08/23 0828  WBC 5.8 5.4 4.2  NEUTROABS 4.7  --  3.1  HGB 10.1* 8.6* 9.2*  HCT 35.4* 29.6* 32.0*  MCV 94.1 93.4 93.0  PLT 174 160 165   Basic Metabolic Panel: Recent Labs  Lab 01/06/23 1642 01/07/23 0549 01/08/23 0828 01/09/23 0442  NA 138 139 135 137  K 3.6 3.3* 4.0 3.6  CL 98 99 101 101  CO2 29 30 27 28   GLUCOSE 123* 97 97 103*  BUN 9 8 7 8   CREATININE 0.72 0.66 0.69 0.62  CALCIUM 9.5 9.2 8.8* 8.6*  MG  --  2.1 1.9 1.8   GFR: Estimated Creatinine Clearance: 112.7 mL/min (by C-G formula based on SCr of 0.62 mg/dL).  HbA1C: Recent Labs    01/08/23 0828  HGBA1C 5.4   CBG: Recent Labs  Lab 01/08/23 1651 01/08/23 2150 01/09/23 0726 01/09/23 1144  GLUCAP  116* 118* 100* 128*    Radiology Studies: No results found.  Scheduled Meds:  apixaban  5 mg Oral BID   atorvastatin  40 mg Oral Daily   cholecalciferol  1,000 Units Oral Daily   cyanocobalamin  1,000 mcg Oral Daily   ferrous sulfate  325 mg Oral Q breakfast   furosemide  20 mg Intravenous TID   gabapentin  300 mg Oral BID   insulin aspart  0-15 Units Subcutaneous TID WC   insulin aspart  0-5 Units Subcutaneous QHS   mometasone-formoterol  2 puff Inhalation BID   PARoxetine  20 mg Oral Daily   potassium chloride  20 mEq Oral  BID   Continuous Infusions:   LOS: 2 days   Time spent: 40 minutes  Carollee Herter, DO  [facility]  01/09/2023, 1:31 PM

## 2023-01-10 DIAGNOSIS — I890 Lymphedema, not elsewhere classified: Secondary | ICD-10-CM | POA: Diagnosis not present

## 2023-01-10 DIAGNOSIS — Z9189 Other specified personal risk factors, not elsewhere classified: Secondary | ICD-10-CM | POA: Diagnosis not present

## 2023-01-10 DIAGNOSIS — R601 Generalized edema: Secondary | ICD-10-CM | POA: Diagnosis not present

## 2023-01-10 LAB — COMPREHENSIVE METABOLIC PANEL
ALT: 11 U/L (ref 0–44)
AST: 11 U/L — ABNORMAL LOW (ref 15–41)
Albumin: 2.6 g/dL — ABNORMAL LOW (ref 3.5–5.0)
Alkaline Phosphatase: 49 U/L (ref 38–126)
Anion gap: 8 (ref 5–15)
BUN: 9 mg/dL (ref 6–20)
CO2: 31 mmol/L (ref 22–32)
Calcium: 8.8 mg/dL — ABNORMAL LOW (ref 8.9–10.3)
Chloride: 97 mmol/L — ABNORMAL LOW (ref 98–111)
Creatinine, Ser: 0.74 mg/dL (ref 0.44–1.00)
GFR, Estimated: >60 mL/min (ref >60)
Glucose, Bld: 86 mg/dL (ref 70–99)
Potassium: 3.7 mmol/L (ref 3.5–5.1)
Sodium: 136 mmol/L (ref 135–145)
Total Bilirubin: 0.6 mg/dL (ref 0.3–1.2)
Total Protein: 6.3 g/dL — ABNORMAL LOW (ref 6.5–8.1)

## 2023-01-10 LAB — MAGNESIUM: Magnesium: 1.9 mg/dL (ref 1.7–2.4)

## 2023-01-10 LAB — GLUCOSE, CAPILLARY
Glucose-Capillary: 159 mg/dL — ABNORMAL HIGH (ref 70–99)
Glucose-Capillary: 116 mg/dL — ABNORMAL HIGH (ref 70–99)
Glucose-Capillary: 91 mg/dL (ref 70–99)
Glucose-Capillary: 131 mg/dL — ABNORMAL HIGH (ref 70–99)

## 2023-01-10 NOTE — Progress Notes (Signed)
Inpatient Rehab Admissions Coordinator:   I have not been able to reach pt's mother or brother to discuss CIR recommendations or caregiver support available.  Feel pt will likely need SNF for rehab.  Will sign off.    Estill Dooms, PT, DPT Admissions Coordinator [phone] 01/10/23  12:19 PM

## 2023-01-10 NOTE — TOC Initial Note (Signed)
Transition of Care [facility]) - Initial/Assessment Note    Patient Details  Name: [name] MRN: [id number] Date of Birth: November 09, 1970  Transition of Care [facility]) CM/SW Contact:    Josua Ferrebee A [place], Theresia Majors Phone Number: 01/10/2023, 1:06 PM  Clinical Narrative:                  CSW met with pt at bedside and discussed SNF recommendation. She stated that she was discharged from [facility] a week or so ago and would prefer home but was ok with CSW faxing out for SNF. Pt is unsure how much insurance will cover her stay since she was just at a facility.   She said if she goes home her mother is currently unable due to her cataract surgery and her two brothers, one is going back out of town to his permanent residence and the other is physically unable to assist.   Disposition plan for SNF current plan. SNF workup completed.   TOC will continue to follow.   Expected Discharge Plan: Skilled Nursing Facility Barriers to Discharge: SNF Pending bed offer, Continued Medical Work up, English as a second language teacher   Patient Goals and CMS Choice Patient states their goals for this hospitalization and ongoing recovery are:: Get my legs stronger          Expected Discharge Plan and Services In-house Referral: Clinical Social Work     Living arrangements for the past 2 months: Skilled Holiday representative, Single Family Home                                      Prior Living Arrangements/Services Living arrangements for the past 2 months: Skilled Nursing Facility, Single Family Home Lives with:: Siblings, Parents          Need for Family Participation in Patient Care: Yes (Comment) Care giver support system in place?: Yes (comment)   Criminal Activity/Legal Involvement Pertinent to Current Situation/Hospitalization: No - Comment as needed  Activities of Daily Living Home Assistive Devices/Equipment: Cane (specify quad or straight), Walker (specify type) ADL Screening (condition at  time of admission) Patient's cognitive ability adequate to safely complete daily activities?: No Is the patient deaf or have difficulty hearing?: No Does the patient have difficulty seeing, even when wearing glasses/contacts?: No Does the patient have difficulty concentrating, remembering, or making decisions?: Yes Patient able to express need for assistance with ADLs?: Yes Does the patient have difficulty dressing or bathing?: No Independently performs ADLs?: No Communication: Independent Dressing (OT): Independent Grooming: Independent Feeding: Independent Bathing: Independent Toileting: Independent with device (comment) In/Out Bed: Needs assistance Walks in Home: Needs assistance Is this a change from baseline?: Change from baseline, expected to last <3 days Does the patient have difficulty walking or climbing stairs?: Yes Weakness of Legs: Both Weakness of Arms/Hands: None  Permission Sought/Granted                  Emotional Assessment Appearance:: Appears older than stated age Attitude/Demeanor/Rapport: Engaged Affect (typically observed): Appropriate Orientation: : Oriented to Self, Oriented to  Time, Oriented to Situation, Oriented to Place Alcohol / Substance Use: Tobacco Use Psych Involvement: No (comment)  Admission diagnosis:  Peripheral edema [R60.0] Volume overload [E87.70] Patient Active Problem List   Diagnosis Date Noted   Lack of motivation 01/08/2023   Anasarca 01/07/2023   Orthostatic dizziness 11/27/2022   Vertigo 11/26/2022   Type 2 diabetes mellitus with  morbid obesity (HCC) 09/18/2022   Moderate major depression (HCC) 05/07/2022   Hyperlipidemia with target LDL less than 100 02/17/2022   Pain in thoracic spine 09/25/2021   Low back pain 09/25/2021   OSA (obstructive sleep apnea) 07/11/2021   Depression 05/28/2021   Morbid obesity with BMI of 50.0-59.9, adult (HCC) 05/26/2021   Bilateral lower extremity edema 11/22/2020   History of DVT  (deep vein thrombosis) 11/22/2020   Asthma, chronic, moderate persistent, with acute exacerbation 11/22/2020   Lymphedema 06/09/2020   Iron deficiency anemia due to chronic blood loss 05/10/2020   Venous insufficiency 04/13/2020   S/P insertion of IVC (inferior vena caval) filter 04/13/2020   Chronic anemia 04/13/2020   Essential hypertension 04/13/2020   (HFpEF) heart failure with preserved ejection fraction (HCC) -> although echo suggests normal diastolic parameters with normal left atrial size 04/13/2020   CAD (coronary artery disease) 11/17/2019   PCP:  Marcine Matar, MD Pharmacy:   [facility] [id number] [place], [place] - [address] [place] [address] Phone: [phone] Fax: [phone]  [facility] [address] [place] [address] Phone: [phone] Fax: [phone]     Social Determinants of Health (SDOH) Social History: SDOH Screenings   Food Insecurity: No Food Insecurity (01/07/2023)  Housing: Patient Declined (01/07/2023)  Transportation Needs: No Transportation Needs (01/07/2023)  Utilities: Not At Risk (01/07/2023)  Alcohol Screen: Low Risk  (09/02/2022)  Depression (PHQ2-9): Low Risk  (09/05/2022)  Financial Resource Strain: Low Risk  (09/02/2022)  Physical Activity: Insufficiently Active (09/02/2022)  Social Connections: Moderately Isolated (09/02/2022)  Stress: No Stress Concern Present (09/02/2022)  Tobacco Use: Medium Risk (01/06/2023)   SDOH Interventions:     Readmission Risk Interventions    11/28/2022   10:29 AM  Readmission Risk Prevention Plan  Post Dischage Appt Complete  Medication Screening Complete  Transportation Screening Complete

## 2023-01-10 NOTE — Progress Notes (Signed)
PROGRESS NOTE    [name]  ZOX:[id number] DOB: Nov 07, 1970 DOA: 01/06/2023 PCP: Marcine Matar, MD  Subjective: Stable overnight. Less edema in her legs. I&Os document net negative 3.2 liters since yesterday.  Admit weight 332.67 lbs Weight today 315.92 lbs  Weight has decreased by 16.75 lbs since admit  BUN 9, Scr 0.74 today.   Hospital Course: HPI: [name] is a 52 y.o. female with medical history significant for chronic diastolic CHF, COPD, history of DVT on Eliquis, type 2 diabetes, diabetic polyneuropathy on gabapentin, recent admission for dizziness (11/25/22-11/30/22-Home p.o. Lasix 80 mg daily was held), discharged to SNF, and from there, recently returned home where she lives with her elderly mother, who presents to the ED via EMS due to bilateral lower extremity edema all the way up to her abdomen.     She gained about 50 pounds in less than a month.  Dry weight 278 pounds and currently weighs about 332 pounds.  No anginal symptoms reported.  Since returning home from SNF, she rarely got out of bed due to her excessive peripheral edema and diabetic polyneuropathy.   In the ED, noted to be volume overload.  IV Lasix was initiated.  Chest x-ray revealed rounded 3.1 cm density along the paramediastinal border on the right concerning for mass, CT is recommended for further evaluation.  No active disease.  The heart is enlarged.  CT chest is pending.   Admitted by [facility], hospitalist service.  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  Antibiotic Therapy:   Procedures:   Consultants:     Assessment and Plan: * Anasarca Pt with lower extremity anasarca. Difficult to determine how much is lymphedema vs true edema. Remains on lasix 20 mg IV TID. Had discussion with patient yesterday about purewick and  how it would be removed today without any further discussion due to risk of UTI. Purewick order canceled.  Lymphedema Chronic lower extremity. Mostly in her thighs as opposed to her lower legs/feet. Improving with diuresis.  Lack of motivation Pt with obvious lack of motivation to help herself with her chronic diseases. She needs encouragement to get out of bed and to use bedside commode to urinate. Do NOT re-order Purewick catheter no matter how much pt begs for it. Do not insert foley. Pt needs to use either bedpan or bedside commode(preferably)  Type 2 diabetes mellitus with morbid obesity (HCC) Stable. Add SSI.  Hyperlipidemia with target LDL less than 100 Stable. On lipitor 40 mg daily.  OSA (obstructive sleep apnea) Chronic.  Depression Chronic. On paxil 20 mg daily.  Morbid obesity with BMI of 50.0-59.9, adult (HCC) BMI 56.08. pt is not a CIR candidate due to poor social support after discharge. Pt will need SNF placement. CM aware.  History of DVT (deep vein thrombosis) On chronic Eliquis due to hx of DVT and IVC filter.  Essential hypertension Stable. Continue with lasix. Holding norvasc due to borderline BP.  Chronic anemia Stable. On oral iron.  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 On eliquis.       DVT prophylaxis:  apixaban (ELIQUIS) tablet 5 mg     Code Status: Full Code Family Communication: no family at bedside Disposition Plan: will need SNF placement. CIR has declined patient Reason for continuing need for hospitalization: remains on IV lasix diuresis. CM starting to look for SNF  placement.  Objective: Vitals:   01/10/23 0559 01/10/23 0602 01/10/23 0727 01/10/23 0743  BP:  103/70 105/60   Pulse:  88 99 100  Resp:  18 16 16   Temp:  (!) 97.5 F (36.4 C) (!) 97.5 F (36.4 C)   TempSrc:  Oral Oral   SpO2:  95% 99% 99%  Weight: (!) 143.3 kg     Height:        Intake/Output Summary (Last 24 hours) at 01/10/2023  0946 Last data filed at 01/10/2023 1610 Gross per 24 hour  Intake 236 ml  Output 3175 ml  Net -2939 ml   Filed Weights   01/08/23 0504 01/09/23 0457 01/10/23 0559  Weight: (!) 143.6 kg (!) 143.4 kg (!) 143.3 kg    Examination:  Physical Exam Vitals and nursing note reviewed.  Constitutional:      Appearance: She is obese. She is not toxic-appearing or diaphoretic.     Comments: Stutters when she speaks  HENT:     Head: Normocephalic and atraumatic.     Nose: Nose normal.  Eyes:     General: No scleral icterus. Cardiovascular:     Rate and Rhythm: Normal rate and regular rhythm.  Pulmonary:     Effort: Pulmonary effort is normal. No respiratory distress.  Abdominal:     General: Abdomen is protuberant. There is no distension.     Comments: Morbidly obese  Skin:    General: Skin is warm and dry.     Capillary Refill: Capillary refill takes less than 2 seconds.     Comments: +1 edema, non-pitting of flanks/upper thighs +trace edema, non-pitting of bilateral lower legs and feet  Neurological:     General: No focal deficit present.     Mental Status: She is oriented to person, place, and time.     Data Reviewed: I have personally reviewed following labs and imaging studies  CBC: Recent Labs  Lab 01/06/23 1642 01/07/23 0549 01/08/23 0828  WBC 5.8 5.4 4.2  NEUTROABS 4.7  --  3.1  HGB 10.1* 8.6* 9.2*  HCT 35.4* 29.6* 32.0*  MCV 94.1 93.4 93.0  PLT 174 160 165   Basic Metabolic Panel: Recent Labs  Lab 01/06/23 1642 01/07/23 0549 01/08/23 0828 01/09/23 0442 01/10/23 0552  NA 138 139 135 137 136  K 3.6 3.3* 4.0 3.6 3.7  CL 98 99 101 101 97*  CO2 29 30 27 28 31   GLUCOSE 123* 97 97 103* 86  BUN 9 8 7 8 9   CREATININE 0.72 0.66 0.69 0.62 0.74  CALCIUM 9.5 9.2 8.8* 8.6* 8.8*  MG  --  2.1 1.9 1.8 1.9   GFR: Estimated Creatinine Clearance: 112.7 mL/min (by C-G formula based on SCr of 0.74 mg/dL). Liver Function Tests: Recent Labs  Lab 01/10/23 0552  AST  11*  ALT 11  ALKPHOS 49  BILITOT 0.6  PROT 6.3*  ALBUMIN 2.6*   HbA1C: Recent Labs    01/08/23 0828  HGBA1C 5.4   CBG: Recent Labs  Lab 01/09/23 0726 01/09/23 1144 01/09/23 1625 01/09/23 2137 01/10/23 0726  GLUCAP 100* 128* 101* 134* 159*    No results found for this or any previous visit (from the past 240 hour(s)).   Radiology Studies: No results found.  Scheduled Meds:  apixaban  5 mg Oral BID   atorvastatin  40 mg Oral Daily   cholecalciferol  1,000 Units Oral Daily   cyanocobalamin  1,000 mcg Oral Daily   ferrous sulfate  325  mg Oral Q breakfast   furosemide  20 mg Intravenous TID   gabapentin  300 mg Oral BID   insulin aspart  0-15 Units Subcutaneous TID WC   insulin aspart  0-5 Units Subcutaneous QHS   mometasone-formoterol  2 puff Inhalation BID   PARoxetine  20 mg Oral Daily   potassium chloride  20 mEq Oral BID   Continuous Infusions:   LOS: 3 days   Time spent: 35 minutes  Carollee Herter, DO  [facility]  01/10/2023, 9:46 AM

## 2023-01-10 NOTE — Progress Notes (Signed)
Physical Therapy Treatment Patient Details Name: [name] MRN: [id number] DOB: Aug 27, 1970 Today's Date: 01/10/2023   History of Present Illness [name] is a 52 y.o. female presented with volume overload. She had recent admission for dizziness (11/25/22-11/30/22), discharged to SNF, and from there, recently returned home where she lives with her elderly mother and now presents to the ED via EMS due to bilateral lower extremity edema all the way up to her abdomen. Since returning home from SNF, she rarely got out of bed due to her excessive peripheral edema and diabetic polyneuropathy. PHMx: chronic diastolic CHF, COPD, history of DVT on Eliquis, type 2 diabetes, diabetic polyneuropathy on gabapentin.    PT Comments  Pt up in bathroom on arrival and demonstrating continued progress towards acute goals this session. Pt demonstrating gait with RW for support with light min A to steady with step-through pattern and improved upright posture, however pt with increased pain with increased distance, with rest breaks between each step last ~5'. Pt performing seated LE therex for increased strength and ROM with cues for technique. Pt continues to benefit from skilled PT services to progress toward functional mobility goals.      If plan is discharge home, recommend the following: A little help with walking and/or transfers;Assistance with cooking/housework;Help with stairs or ramp for entrance;A little help with bathing/dressing/bathroom;Assist for transportation   Can travel by private vehicle     Yes  Equipment Recommendations  None recommended by PT    Recommendations for Other Services       Precautions / Restrictions Precautions Precautions: Fall Restrictions Weight Bearing Restrictions: No     Mobility  Bed Mobility Overal bed mobility: Needs Assistance             General bed mobility comments: up OOB on arrival    Transfers Overall transfer level:  Needs assistance Equipment used: Rolling walker (2 wheels) Transfers: Sit to/from Stand Sit to Stand: Supervision           General transfer comment: supervision from low commode in bathroom    Ambulation/Gait Ambulation/Gait assistance: Min assist Gait Distance (Feet): 30 Feet Assistive device: Rolling walker (2 wheels) Gait Pattern/deviations: Step-to pattern, Decreased stride length, Antalgic, Wide base of support Gait velocity: slow     General Gait Details: improved posture and gait speed bathroom>EOB with RW support and step-through pattern, once asking pt to turn and walk back for second lap pt with very slowed step-to pattern needing rest between each step with increased c/o pain and x1 instance of R knee buckling   Stairs             Wheelchair Mobility     Tilt Bed    Modified Rankin (Stroke Patients Only)       Balance Overall balance assessment: Needs assistance Sitting-balance support: Feet supported Sitting balance-Leahy Scale: Good     Standing balance support: Reliant on assistive device for balance, Bilateral upper extremity supported Standing balance-Leahy Scale: Poor Standing balance comment: able to perform pericare in standing with single UE support in bathrrom                            Cognition Arousal: Alert Behavior During Therapy: WFL for tasks assessed/performed, Anxious Overall Cognitive Status: Within Functional Limits for tasks assessed  General Comments: fearful of falling        Exercises General Exercises - Lower Extremity Long Arc Quad: Strengthening, Right, Left, 10 reps, Seated (with 2 second hold at end range) Hip Flexion/Marching: AROM, Right, Left, 10 reps, Seated Heel Raises: AROM, Both, 10 reps, Seated    General Comments General comments (skin integrity, edema, etc.): VSS      Pertinent Vitals/Pain Pain Assessment Pain Assessment: Faces Faces  Pain Scale: Hurts whole lot Pain Location: R>L LE Pain Descriptors / Indicators: Discomfort, Aching, Grimacing, Guarding Pain Intervention(s): Monitored during session, Limited activity within patient's tolerance    Home Living                          Prior Function            PT Goals (current goals can now be found in the care plan section) Acute Rehab PT Goals Patient Stated Goal: return to independent PT Goal Formulation: With patient Time For Goal Achievement: 01/22/23 Progress towards PT goals: Progressing toward goals    Frequency    Min 1X/week      PT Plan      Co-evaluation              AM-PAC PT "6 Clicks" Mobility   Outcome Measure  Help needed turning from your back to your side while in a flat bed without using bedrails?: A Little Help needed moving from lying on your back to sitting on the side of a flat bed without using bedrails?: A Little Help needed moving to and from a bed to a chair (including a wheelchair)?: A Little Help needed standing up from a chair using your arms (e.g., wheelchair or bedside chair)?: A Little Help needed to walk in hospital room?: A Lot Help needed climbing 3-5 steps with a railing? : Total 6 Click Score: 15    End of Session Equipment Utilized During Treatment: Gait belt Activity Tolerance: Patient limited by pain;Patient tolerated treatment well Patient left: with call bell/phone within reach;in bed (seated EOB) Nurse Communication: Mobility status PT Visit Diagnosis: Muscle weakness (generalized) (M62.81);Other abnormalities of gait and mobility (R26.89);History of falling (Z91.81)     Time: 1610-9604 PT Time Calculation (min) (ACUTE ONLY): 23 min  Charges:    $Gait Training: 8-22 mins $Therapeutic Exercise: 8-22 mins PT General Charges $$ ACUTE PT VISIT: 1 Visit                     Ralpheal Zappone R. PTA Acute Rehabilitation Services Office: [phone]   Catalina Antigua 01/10/2023, 4:38 PM

## 2023-01-10 NOTE — Plan of Care (Signed)

## 2023-01-10 NOTE — Plan of Care (Signed)
  Problem: Coping: Goal: Ability to adjust to condition or change in health will improve Outcome: Progressing   Problem: Fluid Volume: Goal: Ability to maintain a balanced intake and output will improve Outcome: Progressing   Problem: Metabolic: Goal: Ability to maintain appropriate glucose levels will improve Outcome: Progressing   Problem: Skin Integrity: Goal: Risk for impaired skin integrity will decrease Outcome: Progressing

## 2023-01-11 DIAGNOSIS — R601 Generalized edema: Secondary | ICD-10-CM | POA: Diagnosis not present

## 2023-01-11 LAB — COMPREHENSIVE METABOLIC PANEL
ALT: 12 U/L (ref 0–44)
AST: 14 U/L — ABNORMAL LOW (ref 15–41)
Albumin: 2.8 g/dL — ABNORMAL LOW (ref 3.5–5.0)
Alkaline Phosphatase: 51 U/L (ref 38–126)
Anion gap: 12 (ref 5–15)
BUN: 11 mg/dL (ref 6–20)
CO2: 29 mmol/L (ref 22–32)
Calcium: 9 mg/dL (ref 8.9–10.3)
Chloride: 95 mmol/L — ABNORMAL LOW (ref 98–111)
Creatinine, Ser: 0.75 mg/dL (ref 0.44–1.00)
GFR, Estimated: >60 mL/min (ref >60)
Glucose, Bld: 90 mg/dL (ref 70–99)
Potassium: 4 mmol/L (ref 3.5–5.1)
Sodium: 136 mmol/L (ref 135–145)
Total Bilirubin: 0.5 mg/dL (ref 0.3–1.2)
Total Protein: 6.6 g/dL (ref 6.5–8.1)

## 2023-01-11 LAB — GLUCOSE, CAPILLARY
Glucose-Capillary: 93 mg/dL (ref 70–99)
Glucose-Capillary: 107 mg/dL — ABNORMAL HIGH (ref 70–99)
Glucose-Capillary: 108 mg/dL — ABNORMAL HIGH (ref 70–99)
Glucose-Capillary: 109 mg/dL — ABNORMAL HIGH (ref 70–99)

## 2023-01-11 LAB — MAGNESIUM: Magnesium: 1.8 mg/dL (ref 1.7–2.4)

## 2023-01-11 MED ORDER — FUROSEMIDE 10 MG/ML IJ SOLN
40.0000 mg | Freq: Two times a day (BID) | INTRAMUSCULAR | Status: DC
  Administered 2023-01-11 – 2023-01-13 (×6): 40 mg via INTRAVENOUS
  Filled 2023-01-11 (×6): qty 4

## 2023-01-11 MED ORDER — EMPAGLIFLOZIN 10 MG PO TABS
10.0000 mg | ORAL_TABLET | Freq: Every day | ORAL | Status: DC
  Administered 2023-01-11 – 2023-01-18 (×8): 10 mg via ORAL
  Filled 2023-01-11 (×8): qty 1

## 2023-01-11 MED ORDER — POTASSIUM CHLORIDE CRYS ER 20 MEQ PO TBCR
40.0000 meq | EXTENDED_RELEASE_TABLET | Freq: Two times a day (BID) | ORAL | Status: DC
  Administered 2023-01-11 – 2023-01-12 (×3): 40 meq via ORAL
  Filled 2023-01-11 (×3): qty 2

## 2023-01-11 NOTE — Progress Notes (Signed)
PROGRESS NOTE    [name]  VHQ:[id number] DOB: [birth year] DOA: 01/06/2023 PCP: Marcine Matar, MD 52/F with history of chronic diastolic CHF, COPD, history of DVT on Eliquis, type 2 diabetes, diabetic polyneuropathy on gabapentin, recent admission for dizziness (11/25/22-11/30/22-Home p.o. Lasix 80 mg daily was held), discharged to SNF, and from there, recently returned home where she lives with her elderly mother, who presents to the ED via EMS due to bilateral lower extremity edema all the way up to her abdomen.   - gained about 50 pounds in less than a month.  Dry weight 278 pounds and currently weighs about 332 pounds.  Minimally ambulatory -In the ED, noted to be volume overload.  IV Lasix was initiated.  Chest x-ray revealed rounded 3.1 cm density along the paramediastinal border on the right concerning for mass, CT chest chronically occluded/atretic infrarenal IVC, with bulky bilateral body wall venous collaterals likely communicating with the azygous/hemi azygous system.   Subjective: -Feels okay overall, upset about not having a Foley or pure wick    Assessment and Plan:  Anasarca Acute on chronic diastolic CHF -History of 50 pound weight gain after Lasix was held at discharge over a month ago -Last echo 7/24 with EF of 55-60%, indeterminate diastolic parameters, RV is normal -Continue IV Lasix increased dose to 40 Mg twice daily, add Jardiance -Educated on cutting out processed foods  Lymphedema -Likely also contributing to her issues  Lack of motivation Pt demonstrates lack of motivation to help herself with her chronic diseases. She needs encouragement to get out of bed and to use bedside commode to urinate.  Type 2 diabetes mellitus with morbid obesity (HCC) Stable. Add SSI. -Check HbA1c  Hyperlipidemia with target LDL less than 100 Stable. On lipitor 40 mg daily.  OSA (obstructive sleep apnea) Chronic.  Depression Chronic. On paxil 20 mg  daily.  Morbid obesity with BMI of 50.0-59.9, adult (HCC) BMI 56.08. pt is not a CIR candidate due to poor social support after discharge. Pt will need SNF placement. CM aware.  History of DVT (deep vein thrombosis) On chronic Eliquis due to hx of DVT and IVC filter.  Essential hypertension Stable. Continue with lasix. Holding norvasc due to borderline BP.  Chronic anemia Stable. On oral iron.  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 On eliquis.  Chronic pain, neuropathy -On high-dose gabapentin     DVT prophylaxis:  apixaban (ELIQUIS) tablet 5 mg     Code Status: Full Code Family Communication: no family at bedside Disposition Plan: will need SNF placement. CIR has declined patient  Objective: Vitals:   01/10/23 2216 01/11/23 0520 01/11/23 0542 01/11/23 0750  BP: 108/73 123/72  124/62  Pulse: 96 93  (!) 101  Resp:  18  18  Temp:  98.3 F (36.8 C)  97.8 F (36.6 C)  TempSrc:  Oral  Oral  SpO2:  97%  100%  Weight:   (!) 142.4 kg   Height:        Intake/Output Summary (Last 24 hours) at 01/11/2023 0959 Last data filed at 01/11/2023 0914 Gross per 24 hour  Intake 596 ml  Output 2 ml  Net 594 ml   Filed Weights   01/09/23 0457 01/10/23 0559 01/11/23 0542  Weight: (!) 143.4 kg (!) 143.3 kg (!) 142.4 kg    Examination:  Morbidly obese chronically ill female sitting up at the side of the bed, AAOx3, starters when she speaks, mild cognitive deficits HEENT: Neck obese  unable to assess JVD CVS: S1-S2, regular rhythm Lungs: Distant breath sounds otherwise clear Abdomen: Obese, nontender, abdominal wall edema noted Extremities: 1+ edema, extending to upper thighs  Data Reviewed: I have personally reviewed following labs and imaging studies  CBC: Recent Labs  Lab 01/06/23 1642 01/07/23 0549 01/08/23 0828  WBC 5.8 5.4 4.2  NEUTROABS 4.7  --  3.1  HGB 10.1* 8.6* 9.2*  HCT 35.4* 29.6* 32.0*  MCV 94.1 93.4 93.0  PLT 174 160 165   Basic  Metabolic Panel: Recent Labs  Lab 01/07/23 0549 01/08/23 0828 01/09/23 0442 01/10/23 0552 01/11/23 0655  NA 139 135 137 136 136  K 3.3* 4.0 3.6 3.7 4.0  CL 99 101 101 97* 95*  CO2 30 27 28 31 29   GLUCOSE 97 97 103* 86 90  BUN 8 7 8 9 11   CREATININE 0.66 0.69 0.62 0.74 0.75  CALCIUM 9.2 8.8* 8.6* 8.8* 9.0  MG 2.1 1.9 1.8 1.9 1.8   GFR: Estimated Creatinine Clearance: 112.2 mL/min (by C-G formula based on SCr of 0.75 mg/dL). Liver Function Tests: Recent Labs  Lab 01/10/23 0552 01/11/23 0655  AST 11* 14*  ALT 11 12  ALKPHOS 49 51  BILITOT 0.6 0.5  PROT 6.3* 6.6  ALBUMIN 2.6* 2.8*   HbA1C: No results for input(s): "HGBA1C" in the last 72 hours.  CBG: Recent Labs  Lab 01/10/23 0726 01/10/23 1249 01/10/23 1639 01/10/23 2007 01/11/23 0748  GLUCAP 159* 116* 91 131* 93    No results found for this or any previous visit (from the past 240 hour(s)).   Radiology Studies: No results found.  Scheduled Meds:  apixaban  5 mg Oral BID   atorvastatin  40 mg Oral Daily   cholecalciferol  1,000 Units Oral Daily   cyanocobalamin  1,000 mcg Oral Daily   empagliflozin  10 mg Oral Daily   ferrous sulfate  325 mg Oral Q breakfast   furosemide  40 mg Intravenous BID   gabapentin  300 mg Oral BID   insulin aspart  0-15 Units Subcutaneous TID WC   insulin aspart  0-5 Units Subcutaneous QHS   mometasone-formoterol  2 puff Inhalation BID   PARoxetine  20 mg Oral Daily   potassium chloride  40 mEq Oral BID   Continuous Infusions:   LOS: 4 days   Time spent: 35 minutes  Zannie Cove, MD  [facility]  01/11/2023, 9:59 AM

## 2023-01-11 NOTE — Plan of Care (Signed)

## 2023-01-11 NOTE — Plan of Care (Signed)

## 2023-01-12 DIAGNOSIS — R601 Generalized edema: Secondary | ICD-10-CM | POA: Diagnosis not present

## 2023-01-12 LAB — CBC
HCT: 33.9 % — ABNORMAL LOW (ref 36.0–46.0)
Hemoglobin: 9.7 g/dL — ABNORMAL LOW (ref 12.0–15.0)
MCH: 26.4 pg (ref 26.0–34.0)
MCHC: 28.6 g/dL — ABNORMAL LOW (ref 30.0–36.0)
MCV: 92.4 fL (ref 80.0–100.0)
Platelets: 175 10*3/uL (ref 150–400)
RBC: 3.67 MIL/uL — ABNORMAL LOW (ref 3.87–5.11)
RDW: 15 % (ref 11.5–15.5)
WBC: 5.8 10*3/uL (ref 4.0–10.5)
nRBC: 0 % (ref 0.0–0.2)

## 2023-01-12 LAB — GLUCOSE, CAPILLARY
Glucose-Capillary: 95 mg/dL (ref 70–99)
Glucose-Capillary: 145 mg/dL — ABNORMAL HIGH (ref 70–99)
Glucose-Capillary: 89 mg/dL (ref 70–99)
Glucose-Capillary: 115 mg/dL — ABNORMAL HIGH (ref 70–99)

## 2023-01-12 LAB — BASIC METABOLIC PANEL
Anion gap: 13 (ref 5–15)
BUN: 9 mg/dL (ref 6–20)
CO2: 26 mmol/L (ref 22–32)
Calcium: 9.2 mg/dL (ref 8.9–10.3)
Chloride: 96 mmol/L — ABNORMAL LOW (ref 98–111)
Creatinine, Ser: 0.76 mg/dL (ref 0.44–1.00)
GFR, Estimated: >60 mL/min (ref >60)
Glucose, Bld: 114 mg/dL — ABNORMAL HIGH (ref 70–99)
Potassium: 3.7 mmol/L (ref 3.5–5.1)
Sodium: 135 mmol/L (ref 135–145)

## 2023-01-12 MED ORDER — POTASSIUM CHLORIDE CRYS ER 20 MEQ PO TBCR
40.0000 meq | EXTENDED_RELEASE_TABLET | Freq: Every day | ORAL | Status: DC
  Administered 2023-01-13 – 2023-01-15 (×3): 40 meq via ORAL
  Filled 2023-01-12 (×3): qty 2

## 2023-01-12 MED ORDER — MORPHINE SULFATE (PF) 2 MG/ML IV SOLN
1.0000 mg | Freq: Every evening | INTRAVENOUS | Status: AC | PRN
  Administered 2023-01-12: 1 mg via INTRAVENOUS
  Filled 2023-01-12: qty 1

## 2023-01-12 NOTE — Plan of Care (Signed)

## 2023-01-12 NOTE — Progress Notes (Signed)
PROGRESS NOTE    [name]  XBJ:[id number] DOB: July 08, 1970 DOA: 01/06/2023 PCP: Marcine Matar, MD 52/F with history of chronic diastolic CHF, COPD, history of DVT on Eliquis, type 2 diabetes, diabetic polyneuropathy on gabapentin, recent admission for dizziness (11/25/22-11/30/22-Home p.o. Lasix 80 mg daily was held), discharged to SNF, and from there, recently returned home where she lives with her elderly mother, who presents to the ED via EMS due to bilateral lower extremity edema all the way up to her abdomen.   - gained about 50 pounds in less than a month.  Dry weight 278 pounds and currently weighs about 332 pounds.  Minimally ambulatory -In the ED, noted to be volume overload.  IV Lasix was initiated.  Chest x-ray revealed rounded 3.1 cm density along the paramediastinal border on the right concerning for mass, CT chest chronically occluded/atretic infrarenal IVC, with bulky bilateral body wall venous collaterals likely communicating with the azygous/hemi azygous system.   Subjective: -Feels okay overall, upset about not having a Foley or pure wick    Assessment and Plan:  Anasarca Acute on chronic diastolic CHF -History of 50 pound weight gain after Lasix was held at discharge over a month ago -Last echo 7/24 with EF of 55-60%, indeterminate diastolic parameters, RV is normal -Continue IV Lasix, Jardiance, down 20 LB -Educated on cutting out processed foods  Lymphedema -Likely also contributing to her issues  Chronic pain, neuropathy -Continue oxycodone, gabapentin, educated on importance of avoiding IV narcotics  Type 2 diabetes mellitus with morbid obesity (HCC) Stable. Add SSI. -Check HbA1c  Hyperlipidemia with target LDL less than 100 Stable. On lipitor 40 mg daily.  OSA (obstructive sleep apnea) Chronic.  Depression Chronic. On paxil 20 mg daily.  Morbid obesity with BMI of 50.0-59.9, adult (HCC) BMI 56.08. pt is not a CIR candidate due to poor  social support after discharge. Pt will need SNF placement. CM aware.  History of DVT (deep vein thrombosis) On chronic Eliquis due to hx of DVT and IVC filter.  Essential hypertension Stable. Continue with lasix. Holding norvasc due to borderline BP.  Chronic anemia Stable. On oral iron.  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 On eliquis.  DVT prophylaxis:  apixaban (ELIQUIS) tablet 5 mg     Code Status: Full Code Family Communication: no family at bedside Disposition Plan: will need SNF placement. CIR has declined patient  Objective: Vitals:   01/12/23 0053 01/12/23 0358 01/12/23 0452 01/12/23 0825  BP: 138/83 104/66  107/63  Pulse: (!) 105 96  95  Resp: 18 18  18   Temp: 98 F (36.7 C) 98 F (36.7 C)  98.2 F (36.8 C)  TempSrc: Oral Oral  Oral  SpO2: 99% 94%  100%  Weight:   (!) 141.9 kg   Height:        Intake/Output Summary (Last 24 hours) at 01/12/2023 1024 Last data filed at 01/11/2023 1900 Gross per 24 hour  Intake 360 ml  Output 800 ml  Net -440 ml   Filed Weights   01/10/23 0559 01/11/23 0542 01/12/23 0452  Weight: (!) 143.3 kg (!) 142.4 kg (!) 141.9 kg    Examination:  Morbidly obese chronically ill female sitting up in bed, AAOx3, stutters as she speaks, mild cognitive deficits CVS: S1-S2, regular rhythm Lungs: Distant breath sounds otherwise clear Abdomen:: Morbidly obese, nontender, mild abdominal wall edema Extremities: 1+ edema, extending to upper thighs  Data Reviewed: I have personally reviewed following labs and imaging  studies  CBC: Recent Labs  Lab 01/06/23 1642 01/07/23 0549 01/08/23 0828 01/12/23 0732  WBC 5.8 5.4 4.2 5.8  NEUTROABS 4.7  --  3.1  --   HGB 10.1* 8.6* 9.2* 9.7*  HCT 35.4* 29.6* 32.0* 33.9*  MCV 94.1 93.4 93.0 92.4  PLT 174 160 165 175   Basic Metabolic Panel: Recent Labs  Lab 01/07/23 0549 01/08/23 0828 01/09/23 0442 01/10/23 0552 01/11/23 0655 01/12/23 0732  NA 139 135 137 136 136  135  K 3.3* 4.0 3.6 3.7 4.0 3.7  CL 99 101 101 97* 95* 96*  CO2 30 27 28 31 29 26   GLUCOSE 97 97 103* 86 90 114*  BUN 8 7 8 9 11 9   CREATININE 0.66 0.69 0.62 0.74 0.75 0.76  CALCIUM 9.2 8.8* 8.6* 8.8* 9.0 9.2  MG 2.1 1.9 1.8 1.9 1.8  --    GFR: Estimated Creatinine Clearance: 111.9 mL/min (by C-G formula based on SCr of 0.76 mg/dL). Liver Function Tests: Recent Labs  Lab 01/10/23 0552 01/11/23 0655  AST 11* 14*  ALT 11 12  ALKPHOS 49 51  BILITOT 0.6 0.5  PROT 6.3* 6.6  ALBUMIN 2.6* 2.8*   HbA1C: No results for input(s): "HGBA1C" in the last 72 hours.  CBG: Recent Labs  Lab 01/11/23 0748 01/11/23 1145 01/11/23 1659 01/11/23 1941 01/12/23 0824  GLUCAP 93 107* 108* 109* 95    No results found for this or any previous visit (from the past 240 hour(s)).   Radiology Studies: No results found.  Scheduled Meds:  apixaban  5 mg Oral BID   atorvastatin  40 mg Oral Daily   cholecalciferol  1,000 Units Oral Daily   cyanocobalamin  1,000 mcg Oral Daily   empagliflozin  10 mg Oral Daily   ferrous sulfate  325 mg Oral Q breakfast   furosemide  40 mg Intravenous BID   gabapentin  300 mg Oral BID   insulin aspart  0-15 Units Subcutaneous TID WC   insulin aspart  0-5 Units Subcutaneous QHS   mometasone-formoterol  2 puff Inhalation BID   PARoxetine  20 mg Oral Daily   potassium chloride  40 mEq Oral BID   Continuous Infusions:   LOS: 5 days   Time spent: 35 minutes  Zannie Cove, MD  [facility]  01/12/2023, 10:24 AM

## 2023-01-13 ENCOUNTER — Encounter: Payer: Self-pay | Admitting: Pulmonary Disease

## 2023-01-13 DIAGNOSIS — R601 Generalized edema: Secondary | ICD-10-CM | POA: Diagnosis not present

## 2023-01-13 LAB — BASIC METABOLIC PANEL
Anion gap: 15 (ref 5–15)
BUN: 11 mg/dL (ref 6–20)
CO2: 28 mmol/L (ref 22–32)
Calcium: 9.6 mg/dL (ref 8.9–10.3)
Chloride: 94 mmol/L — ABNORMAL LOW (ref 98–111)
Creatinine, Ser: 0.73 mg/dL (ref 0.44–1.00)
GFR, Estimated: >60 mL/min (ref >60)
Glucose, Bld: 79 mg/dL (ref 70–99)
Potassium: 3.6 mmol/L (ref 3.5–5.1)
Sodium: 137 mmol/L (ref 135–145)

## 2023-01-13 LAB — GLUCOSE, CAPILLARY
Glucose-Capillary: 106 mg/dL — ABNORMAL HIGH (ref 70–99)
Glucose-Capillary: 156 mg/dL — ABNORMAL HIGH (ref 70–99)
Glucose-Capillary: 91 mg/dL (ref 70–99)
Glucose-Capillary: 108 mg/dL — ABNORMAL HIGH (ref 70–99)

## 2023-01-13 MED ORDER — MORPHINE SULFATE (PF) 2 MG/ML IV SOLN
1.0000 mg | Freq: Once | INTRAVENOUS | Status: AC
  Administered 2023-01-13: 1 mg via INTRAVENOUS
  Filled 2023-01-13: qty 1

## 2023-01-13 MED ORDER — SPIRONOLACTONE 25 MG PO TABS
25.0000 mg | ORAL_TABLET | Freq: Every day | ORAL | Status: DC
  Administered 2023-01-13 – 2023-01-18 (×6): 25 mg via ORAL
  Filled 2023-01-13 (×6): qty 1

## 2023-01-13 NOTE — Discharge Instructions (Signed)
Information on my medicine - ELIQUIS (apixaban)  This medication education was reviewed with me or my healthcare representative as part of my discharge preparation.    Why was Eliquis prescribed for you? Eliquis was prescribed to treat blood clots that may have been found in the veins of your legs (deep vein thrombosis) or in your lungs (pulmonary embolism) and to reduce the risk of them occurring again.  What do You need to know about Eliquis ? Continue Eliquist 5 mg tablet taken TWICE daily.  Eliquis may be taken with or without food.   Try to take the dose about the same time in the morning and in the evening. If you have difficulty swallowing the tablet whole please discuss with your pharmacist how to take the medication safely.  Take Eliquis exactly as prescribed and DO NOT stop taking Eliquis without talking to the doctor who prescribed the medication.  Stopping may increase your risk of developing a new blood clot.  Refill your prescription before you run out.  After discharge, you should have regular check-up appointments with your healthcare provider that is prescribing your Eliquis.    What do you do if you miss a dose? If a dose of ELIQUIS is not taken at the scheduled time, take it as soon as possible on the same day and twice-daily administration should be resumed. The dose should not be doubled to make up for a missed dose.  Important Safety Information A possible side effect of Eliquis is bleeding. You should call your healthcare provider right away if you experience any of the following: Bleeding from an injury or your nose that does not stop. Unusual colored urine (red or dark brown) or unusual colored stools (red or black). Unusual bruising for unknown reasons. A serious fall or if you hit your head (even if there is no bleeding).  Some medicines may interact with Eliquis and might increase your risk of bleeding or clotting while on Eliquis. To help avoid this,  consult your healthcare provider or pharmacist prior to using any new prescription or non-prescription medications, including herbals, vitamins, non-steroidal anti-inflammatory drugs (NSAIDs) and supplements.  This website has more information on Eliquis (apixaban): http://www.eliquis.com/eliquis/home

## 2023-01-13 NOTE — Plan of Care (Signed)
  Problem: Education: Goal: Knowledge of General Education information will improve Description: Including pain rating scale, medication(s)/side effects and non-pharmacologic comfort measures Outcome: Progressing   Problem: Health Behavior/Discharge Planning: Goal: Ability to manage health-related needs will improve Outcome: Progressing   Problem: Clinical Measurements: Goal: Ability to maintain clinical measurements within normal limits will improve Outcome: Progressing   Problem: Clinical Measurements: Goal: Will remain free from infection Outcome: Progressing   Problem: Clinical Measurements: Goal: Respiratory complications will improve Outcome: Progressing   Problem: Pain Managment: Goal: General experience of comfort will improve Outcome: Progressing

## 2023-01-13 NOTE — TOC Progression Note (Signed)
Transition of Care [facility]) - Progression Note    Patient Details  Name: [name] MRN: [id number] Date of Birth: 03/07/1971  [facility]) CM/SW Contact  Wendelin Bradt A [place], [place] Phone Number: 01/13/2023, 2:28 PM  Clinical Narrative:     Update 1430 Pt's authorization approved. DC pending medical stability.   Update 9/17 1229 CSW informed that facility, [facility], started authorization for SNF. Status pending.     CSW met with pt at bedside to discuss bed offers. She said that she was interested in [facility] versus [facility].   CSW followed up with [facility], they informed CSW that pt had a balance at facility and that it would need to be coordinated with the business office regarding accepting bed and financial plan. CSW updated pt of information.   She selected [facility] for SNF. Facility to start insurance authorization closer to pt's medical readiness.   TOC will continue to follow.   Expected Discharge Plan: Skilled Nursing Facility Barriers to Discharge: SNF Pending bed offer, Continued Medical Work up, English as a second language teacher  Expected Discharge Plan and Services In-house Referral: Clinical Social Work     Living arrangements for the past 2 months: Skilled Holiday representative, Single Family Home                                       Social Determinants of Health (SDOH) Interventions SDOH Screenings   Food Insecurity: No Food Insecurity (01/07/2023)  Housing: Patient Declined (01/07/2023)  Transportation Needs: No Transportation Needs (01/07/2023)  Utilities: Not At Risk (01/07/2023)  Alcohol Screen: Low Risk  (09/02/2022)  Depression (PHQ2-9): Low Risk  (09/05/2022)  Financial Resource Strain: Low Risk  (09/02/2022)  Physical Activity: Insufficiently Active (09/02/2022)  Social Connections: Moderately Isolated (09/02/2022)  Stress: No Stress Concern Present (09/02/2022)  Tobacco Use: Medium Risk (01/06/2023)    Readmission Risk  Interventions    11/28/2022   10:29 AM  Readmission Risk Prevention Plan  Post Dischage Appt Complete  Medication Screening Complete  Transportation Screening Complete

## 2023-01-13 NOTE — Plan of Care (Signed)

## 2023-01-13 NOTE — Progress Notes (Signed)
   Heart Failure Stewardship Pharmacist Progress Note   PCP: Marcine Matar, MD PCP-Cardiologist: Bryan Lemma, MD    HPI:  52 yo F with PMH of CHF, COPD, DVT, T2DM, and diabetic polyneuropathy.  Presented to the ED on 9/9 with LE edema and cellulitis. She was released from SNF rehab 3 days prior and was not able to get assistance with taking care of herself at home. She rarely got out of the bed due to her excessive peripheral edema. She reports she gained about 50 lbs in a month with being in the SNF. Last ECHO 7/31 showed EF 60-65%, no RWMA, RV normal.   Current HF Medications: Diuretic: furosemide 40 mg IV BID MRA: spironolactone 25 mg daily SGLT2i: Jardiance 10 mg daily  Prior to admission HF Medications: Diuretic: furosemide 80 mg daily SGLT2i: Farxiga 10 mg daily  Pertinent Lab Values: Serum creatinine 0.73, BUN 11, Potassium 3.6, Sodium 137, BNP 43.7, Magnesium 1.8, A1c 5.4   Vital Signs: Weight: 305 lbs (admission weight: 332 lbs - reports dry weight 278 lbs) Blood pressure: 110-130/80s  Heart rate: 90s  I/O: net -1.1L yesterday; net -8.8L since admission  Medication Assistance / Insurance Benefits Check: Does the patient have prescription insurance?  Yes Type of insurance plan: [place] Medicaid  Outpatient Pharmacy:  Prior to admission outpatient pharmacy: [facility] Is the patient willing to use [facility] TOC pharmacy at discharge? Yes  Assessment: 1. Acute on chronic diastolic CHF (LVEF 60-65%). NYHA class III symptoms. - On furosemide 40 mg IV BID, has had about 1-1.5 L of recorded output over the last few days. Consider increasing to 80 mg IV BID. Strict I/Os and daily weights. Keep K>4 and Mg>2. Dry weight ~278 lbs.  - Continue spironolactone 25 mg daily - Concerned for adverse effects of SGLT2i with lack of motivation to keep up with her health. Needs motivation and encouragement to get out of bed and use the bedside commode.    Plan: 1) Medication  changes recommended at this time: - Continue IV diuresis, consider increasing to 80 mg IV BID  2) Patient assistance: - None pending, has  Medicaid - Possible CIR / SNF at discharge  3)  Education  - To be completed prior to discharge  Sharen Hones, PharmD, BCPS Heart Failure Stewardship Pharmacist Phone [phone]

## 2023-01-13 NOTE — Progress Notes (Signed)
PROGRESS NOTE    [name]  VHQ:[id number] DOB: [birth year] DOA: 01/06/2023 PCP: Marcine Matar, MD 52/F with history of chronic diastolic CHF, COPD, history of DVT on Eliquis, type 2 diabetes, diabetic polyneuropathy on gabapentin, recent admission for dizziness (11/25/22-11/30/22-Home p.o. Lasix 80 mg daily was held), discharged to SNF, and from there, recently returned home where she lives with her elderly mother, who presents to the ED via EMS due to bilateral lower extremity edema all the way up to her abdomen.   - gained about 50 pounds in less than a month.  Dry weight 278 pounds and currently weighs about 332 pounds.  Minimally ambulatory -In the ED, noted to be volume overload.  IV Lasix was initiated.  Chest x-ray revealed rounded 3.1 cm density along the paramediastinal border on the right concerning for mass, CT chest chronically occluded/atretic infrarenal IVC, with bulky bilateral body wall venous collaterals likely communicating with the azygous/hemi azygous system.   Subjective: -Has chronic low back pain, breathing is improving, frustrated about frequent urination on Lasix, asking to hold a.m. diuretic dose    Assessment and Plan:  Anasarca Acute on chronic diastolic CHF -History of 50 pound weight gain after Lasix was held at discharge over a month ago -Last echo 7/24 with EF of 55-60%, indeterminate diastolic parameters, RV is normal -Continue IV Lasix, down 27 LB, continue Jardiance and restart Aldactone -Educated on cutting out processed foods -Increase activity, BMP in a.m. -TOC following for possible placement  Lymphedema -Likely also contributing to her issues  Chronic pain, neuropathy -Continue oxycodone, gabapentin, educated on importance of avoiding IV narcotics  Type 2 diabetes mellitus with morbid obesity (HCC) Stable. Add SSI. -Check HbA1c  Hyperlipidemia with target LDL less than 100 Stable. On lipitor 40 mg daily.  OSA (obstructive  sleep apnea) -Allegedly intolerant to CPAP  Depression Chronic. On paxil 20 mg daily.  Morbid obesity with BMI of 50.0-59.9, adult (HCC) BMI 56.08. pt is not a CIR candidate due to poor social support after discharge. Pt will need SNF placement. CM aware.  History of DVT (deep vein thrombosis) On chronic Eliquis due to hx of DVT and IVC filter.  Essential hypertension Stable. Continue with lasix. Holding norvasc due to borderline BP.  Chronic anemia Stable. On oral iron.  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 On eliquis.  DVT prophylaxis:  apixaban (ELIQUIS) tablet 5 mg     Code Status: Full Code Family Communication: no family at bedside Disposition Plan: will need SNF placement. CIR has declined patient  Objective: Vitals:   01/13/23 0500 01/13/23 0511 01/13/23 0820 01/13/23 0825  BP:  105/65 123/86   Pulse:  96 (!) 108   Resp:  18 16   Temp:  98 F (36.7 C) 98.1 F (36.7 C)   TempSrc:  Oral Oral   SpO2:  96% 99% 99%  Weight: (!) 138.6 kg     Height:        Intake/Output Summary (Last 24 hours) at 01/13/2023 0947 Last data filed at 01/12/2023 1300 Gross per 24 hour  Intake --  Output 700 ml  Net -700 ml   Filed Weights   01/11/23 0542 01/12/23 0452 01/13/23 0500  Weight: (!) 142.4 kg (!) 141.9 kg (!) 138.6 kg    Examination:  Morbidly obese chronically ill female sitting up in bed, AAOx3, stutters as she speaks, mild cognitive deficits HEENT: Neck obese unable to assess JVD CVS: S1-S2, regular rhythm Lungs: Distant breath sounds  otherwise clear Abdomen: Morbidly obese, soft, nontender, mild abdominal wall edema Extremities: 1+ edema, extending to upper thighs  Data Reviewed: I have personally reviewed following labs and imaging studies  CBC: Recent Labs  Lab 01/06/23 1642 01/07/23 0549 01/08/23 0828 01/12/23 0732  WBC 5.8 5.4 4.2 5.8  NEUTROABS 4.7  --  3.1  --   HGB 10.1* 8.6* 9.2* 9.7*  HCT 35.4* 29.6* 32.0* 33.9*  MCV  94.1 93.4 93.0 92.4  PLT 174 160 165 175   Basic Metabolic Panel: Recent Labs  Lab 01/07/23 0549 01/08/23 0828 01/09/23 0442 01/10/23 0552 01/11/23 0655 01/12/23 0732  NA 139 135 137 136 136 135  K 3.3* 4.0 3.6 3.7 4.0 3.7  CL 99 101 101 97* 95* 96*  CO2 30 27 28 31 29 26   GLUCOSE 97 97 103* 86 90 114*  BUN 8 7 8 9 11 9   CREATININE 0.66 0.69 0.62 0.74 0.75 0.76  CALCIUM 9.2 8.8* 8.6* 8.8* 9.0 9.2  MG 2.1 1.9 1.8 1.9 1.8  --    GFR: Estimated Creatinine Clearance: 110.3 mL/min (by C-G formula based on SCr of 0.76 mg/dL). Liver Function Tests: Recent Labs  Lab 01/10/23 0552 01/11/23 0655  AST 11* 14*  ALT 11 12  ALKPHOS 49 51  BILITOT 0.6 0.5  PROT 6.3* 6.6  ALBUMIN 2.6* 2.8*   HbA1C: No results for input(s): "HGBA1C" in the last 72 hours.  CBG: Recent Labs  Lab 01/12/23 0824 01/12/23 1250 01/12/23 1513 01/12/23 2009 01/13/23 0752  GLUCAP 95 145* 89 115* 106*    No results found for this or any previous visit (from the past 240 hour(s)).   Radiology Studies: No results found.  Scheduled Meds:  apixaban  5 mg Oral BID   atorvastatin  40 mg Oral Daily   cholecalciferol  1,000 Units Oral Daily   cyanocobalamin  1,000 mcg Oral Daily   empagliflozin  10 mg Oral Daily   ferrous sulfate  325 mg Oral Q breakfast   furosemide  40 mg Intravenous BID   gabapentin  300 mg Oral BID   insulin aspart  0-15 Units Subcutaneous TID WC   insulin aspart  0-5 Units Subcutaneous QHS   mometasone-formoterol  2 puff Inhalation BID   PARoxetine  20 mg Oral Daily   potassium chloride  40 mEq Oral Daily   spironolactone  25 mg Oral Daily   Continuous Infusions:   LOS: 6 days   Time spent: 35 minutes  Zannie Cove, MD  [facility]  01/13/2023, 9:47 AM

## 2023-01-14 DIAGNOSIS — R601 Generalized edema: Secondary | ICD-10-CM | POA: Diagnosis not present

## 2023-01-14 LAB — BASIC METABOLIC PANEL
Anion gap: 8 (ref 5–15)
BUN: 11 mg/dL (ref 6–20)
CO2: 30 mmol/L (ref 22–32)
Calcium: 9 mg/dL (ref 8.9–10.3)
Chloride: 98 mmol/L (ref 98–111)
Creatinine, Ser: 0.81 mg/dL (ref 0.44–1.00)
GFR, Estimated: >60 mL/min (ref >60)
Glucose, Bld: 129 mg/dL — ABNORMAL HIGH (ref 70–99)
Potassium: 3.8 mmol/L (ref 3.5–5.1)
Sodium: 136 mmol/L (ref 135–145)

## 2023-01-14 LAB — GLUCOSE, CAPILLARY
Glucose-Capillary: 128 mg/dL — ABNORMAL HIGH (ref 70–99)
Glucose-Capillary: 81 mg/dL (ref 70–99)
Glucose-Capillary: 111 mg/dL — ABNORMAL HIGH (ref 70–99)

## 2023-01-14 MED ORDER — GABAPENTIN 400 MG PO CAPS
400.0000 mg | ORAL_CAPSULE | Freq: Three times a day (TID) | ORAL | Status: DC
  Administered 2023-01-14 – 2023-01-18 (×14): 400 mg via ORAL
  Filled 2023-01-14 (×14): qty 1

## 2023-01-14 MED ORDER — FUROSEMIDE 10 MG/ML IJ SOLN
60.0000 mg | Freq: Two times a day (BID) | INTRAMUSCULAR | Status: DC
  Administered 2023-01-14 – 2023-01-15 (×4): 60 mg via INTRAVENOUS
  Filled 2023-01-14 (×5): qty 6

## 2023-01-14 NOTE — Progress Notes (Signed)
   Heart Failure Stewardship Pharmacist Progress Note   PCP: Marcine Matar, MD PCP-Cardiologist: Bryan Lemma, MD    HPI:  52 yo F with PMH of CHF, COPD, DVT, T2DM, and diabetic polyneuropathy.  Presented to the ED on 9/9 with LE edema and cellulitis. She was released from SNF rehab 3 days prior and was not able to get assistance with taking care of herself at home. She rarely got out of the bed due to her excessive peripheral edema. She reports she gained about 50 lbs in a month with being in the SNF. Last ECHO 7/31 showed EF 60-65%, no RWMA, RV normal.   Current HF Medications: Diuretic: furosemide 60 mg IV BID MRA: spironolactone 25 mg daily SGLT2i: Jardiance 10 mg daily  Prior to admission HF Medications: Diuretic: furosemide 80 mg daily SGLT2i: Farxiga 10 mg daily  Pertinent Lab Values: Serum creatinine 0.81, BUN 11, Potassium 3.8, Sodium 136, BNP 43.7, Magnesium 1.8, A1c 5.4   Vital Signs: Weight: 303 lbs (admission weight: 332 lbs - reports dry weight 278 lbs) Blood pressure: 110/60s  Heart rate: 90s  I/O: net -0.4L yesterday; net -9.8L since admission  Medication Assistance / Insurance Benefits Check: Does the patient have prescription insurance?  Yes Type of insurance plan: [place] Medicaid  Outpatient Pharmacy:  Prior to admission outpatient pharmacy: [facility] Is the patient willing to use [facility] TOC pharmacy at discharge? Yes  Assessment: 1. Acute on chronic diastolic CHF (LVEF 60-65%). NYHA class III symptoms. - Agree with increasing to furosemide 60 mg IV BID. Pending response, may need to increase further to 80 mg IV BID. Strict I/Os and daily weights. Keep K>4 and Mg>2. Dry weight ~278 lbs.  - Continue spironolactone 25 mg daily - Concerned for adverse effects of SGLT2i with lack of motivation to keep up with her health. Needs motivation and encouragement to get out of bed and use the bedside commode.    Plan: 1) Medication changes recommended at  this time: - Continue IV diuresis, consider increasing to 80 mg IV BID  2) Patient assistance: - None pending, has [place] Medicaid - Possible CIR / SNF at discharge  3)  Education  - To be completed prior to discharge  Sharen Hones, PharmD, BCPS Heart Failure Stewardship Pharmacist Phone [phone]

## 2023-01-14 NOTE — Plan of Care (Signed)
  Problem: Education: Goal: Knowledge of General Education information will improve Description: Including pain rating scale, medication(s)/side effects and non-pharmacologic comfort measures Outcome: Progressing   Problem: Health Behavior/Discharge Planning: Goal: Ability to manage health-related needs will improve Outcome: Progressing   Problem: Clinical Measurements: Goal: Ability to maintain clinical measurements within normal limits will improve Outcome: Progressing   Problem: Clinical Measurements: Goal: Will remain free from infection Outcome: Progressing   Problem: Clinical Measurements: Goal: Diagnostic test results will improve Outcome: Progressing   Problem: Nutrition: Goal: Adequate nutrition will be maintained Outcome: Progressing   Problem: Coping: Goal: Ability to adjust to condition or change in health will improve Outcome: Progressing   Problem: Health Behavior/Discharge Planning: Goal: Ability to manage health-related needs will improve Outcome: Progressing   Problem: Metabolic: Goal: Ability to maintain appropriate glucose levels will improve Outcome: Progressing

## 2023-01-14 NOTE — Plan of Care (Signed)

## 2023-01-14 NOTE — Progress Notes (Signed)
PT Cancellation Note  Patient Details Name: [name] MRN: [id number] DOB: [birth year]   Cancelled Treatment:    Reason Eval/Treat Not Completed: (P) Patient declined, no reason specified, pt declining PT session, stating she has been up/down to [facility] multiple times throughout day due to lasix and is requesting to rest despite encouragement from PTA. Will check back as schedule allows to continue with PT POC.  Lenora Boys. PTA Acute Rehabilitation Services Office: [phone]    Catalina Antigua 01/14/2023, 1:55 PM

## 2023-01-14 NOTE — Progress Notes (Addendum)
PROGRESS NOTE    [name]  YNW:[id number] DOB: [birth year] DOA: 01/06/2023 PCP: Marcine Matar, MD 52/F with history of chronic diastolic CHF, COPD, history of DVT on Eliquis, type 2 diabetes, diabetic polyneuropathy on gabapentin, recent admission for dizziness (11/25/22-11/30/22-Home p.o. Lasix 80 mg daily was held), discharged to SNF, and from there, recently returned home where she lives with her elderly mother, who presents to the ED via EMS due to bilateral lower extremity edema all the way up to her abdomen.   - gained about 50 pounds in less than a month.  Dry weight 278 pounds and currently weighs about 332 pounds.  Minimally ambulatory -In the ED, noted to be volume overload.  IV Lasix was initiated.  Chest x-ray revealed rounded 3.1 cm density along the paramediastinal border on the right concerning for mass, CT chest chronically occluded/atretic infrarenal IVC, with bulky bilateral body wall venous collaterals likely communicating with the azygous/hemi azygous system.   Subjective: -Continues to complain of back and leg pain which is chronic, requesting IV pain medicine    Assessment and Plan:  Anasarca Acute on chronic diastolic CHF -History of 50 pound weight gain after Lasix was held at discharge over a month ago -Last echo 7/24 with EF of 55-60%, indeterminate diastolic parameters, RV is normal -Continue IV Lasix, down 29 LB, continue Jardiance and Aldactone -Educated on cutting out processed foods -Increase activity, BMP in a.m. -TOC following for SNF, heart failure TOC clinic follow-up made  Lymphedema -In addition to above  Chronic pain, neuropathy -Continue oxycodone, gabapentin, educated on importance of avoiding IV narcotics -Increase gabapentin  Type 2 diabetes mellitus with morbid obesity (HCC) Stable. Add SSI. -A1c is 5.4  Hyperlipidemia with target LDL less than 100 Stable. On lipitor 40 mg daily.  OSA (obstructive sleep apnea) -Allegedly  intolerant to CPAP  Depression Chronic. On paxil 20 mg daily.  Morbid obesity with BMI of 50.0-59.9, adult (HCC) BMI 56.08. pt is not a CIR candidate due to poor social support after discharge. Pt will need SNF placement. CM aware.  History of DVT (deep vein thrombosis) On chronic Eliquis due to hx of DVT and IVC filter.  Essential hypertension Stable. Continue with lasix. Holding norvasc due to borderline BP.  Chronic anemia Stable. On oral iron.  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 On eliquis.  DVT prophylaxis:  apixaban (ELIQUIS) tablet 5 mg     Code Status: Full Code Family Communication: no family at bedside Disposition Plan: will need SNF placement. CIR has declined patient  Objective: Vitals:   01/14/23 0546 01/14/23 0739 01/14/23 0825 01/14/23 0845  BP: 102/73 129/64  108/68  Pulse: 89 97  97  Resp: 18   18  Temp: 97.9 F (36.6 C)   97.7 F (36.5 C)  TempSrc: Oral   Oral  SpO2: 100% 99% 97% 99%  Weight: (!) 137.5 kg     Height:        Intake/Output Summary (Last 24 hours) at 01/14/2023 1127 Last data filed at 01/14/2023 1023 Gross per 24 hour  Intake 360 ml  Output 1850 ml  Net -1490 ml   Filed Weights   01/12/23 0452 01/13/23 0500 01/14/23 0546  Weight: (!) 141.9 kg (!) 138.6 kg (!) 137.5 kg    Examination:  Morbidly obese chronically ill female sitting up in bed, AAOx3, stutters as she speaks, mild cognitive deficits HEENT: Neck obese unable to assess JVD CVS: S1-S2, regular rhythm Lungs: Distant breath sounds,  otherwise clear  Abdomen: Morbidly obese, soft, nontender, mild abdominal wall edema Extremities: 1+ edema, extending to upper thighs  Data Reviewed: I have personally reviewed following labs and imaging studies  CBC: Recent Labs  Lab 01/08/23 0828 01/12/23 0732  WBC 4.2 5.8  NEUTROABS 3.1  --   HGB 9.2* 9.7*  HCT 32.0* 33.9*  MCV 93.0 92.4  PLT 165 175   Basic Metabolic Panel: Recent Labs  Lab  01/08/23 0828 01/09/23 0442 01/10/23 0552 01/11/23 0655 01/12/23 0732 01/13/23 1127 01/14/23 0753  NA 135 137 136 136 135 137 136  K 4.0 3.6 3.7 4.0 3.7 3.6 3.8  CL 101 101 97* 95* 96* 94* 98  CO2 27 28 31 29 26 28 30   GLUCOSE 97 103* 86 90 114* 79 129*  BUN 7 8 9 11 9 11 11   CREATININE 0.69 0.62 0.74 0.75 0.76 0.73 0.81  CALCIUM 8.8* 8.6* 8.8* 9.0 9.2 9.6 9.0  MG 1.9 1.8 1.9 1.8  --   --   --    GFR: Estimated Creatinine Clearance: 108.3 mL/min (by C-G formula based on SCr of 0.81 mg/dL). Liver Function Tests: Recent Labs  Lab 01/10/23 0552 01/11/23 0655  AST 11* 14*  ALT 11 12  ALKPHOS 49 51  BILITOT 0.6 0.5  PROT 6.3* 6.6  ALBUMIN 2.6* 2.8*   HbA1C: No results for input(s): "HGBA1C" in the last 72 hours.  CBG: Recent Labs  Lab 01/13/23 0752 01/13/23 1237 01/13/23 1626 01/13/23 2118 01/14/23 0740  GLUCAP 106* 156* 91 108* 128*    No results found for this or any previous visit (from the past 240 hour(s)).   Radiology Studies: No results found.  Scheduled Meds:  apixaban  5 mg Oral BID   atorvastatin  40 mg Oral Daily   cholecalciferol  1,000 Units Oral Daily   cyanocobalamin  1,000 mcg Oral Daily   empagliflozin  10 mg Oral Daily   ferrous sulfate  325 mg Oral Q breakfast   furosemide  60 mg Intravenous BID   gabapentin  400 mg Oral TID   insulin aspart  0-15 Units Subcutaneous TID WC   insulin aspart  0-5 Units Subcutaneous QHS   mometasone-formoterol  2 puff Inhalation BID   PARoxetine  20 mg Oral Daily   potassium chloride  40 mEq Oral Daily   spironolactone  25 mg Oral Daily   Continuous Infusions:   LOS: 7 days   Time spent: 35 minutes  Zannie Cove, MD  [facility]  01/14/2023, 11:27 AM

## 2023-01-15 ENCOUNTER — Ambulatory Visit: Payer: Self-pay | Admitting: *Deleted

## 2023-01-15 DIAGNOSIS — R601 Generalized edema: Secondary | ICD-10-CM | POA: Diagnosis not present

## 2023-01-15 LAB — GLUCOSE, CAPILLARY
Glucose-Capillary: 105 mg/dL — ABNORMAL HIGH (ref 70–99)
Glucose-Capillary: 108 mg/dL — ABNORMAL HIGH (ref 70–99)
Glucose-Capillary: 95 mg/dL (ref 70–99)
Glucose-Capillary: 122 mg/dL — ABNORMAL HIGH (ref 70–99)

## 2023-01-15 MED ORDER — MORPHINE SULFATE (PF) 2 MG/ML IV SOLN
1.0000 mg | Freq: Every evening | INTRAVENOUS | Status: AC | PRN
  Administered 2023-01-15: 1 mg via INTRAVENOUS
  Filled 2023-01-15: qty 1

## 2023-01-15 MED ORDER — POTASSIUM CHLORIDE CRYS ER 20 MEQ PO TBCR
40.0000 meq | EXTENDED_RELEASE_TABLET | Freq: Two times a day (BID) | ORAL | Status: DC
  Administered 2023-01-15 – 2023-01-18 (×6): 40 meq via ORAL
  Filled 2023-01-15 (×6): qty 2

## 2023-01-15 MED ORDER — POTASSIUM CHLORIDE CRYS ER 20 MEQ PO TBCR
40.0000 meq | EXTENDED_RELEASE_TABLET | Freq: Once | ORAL | Status: AC
  Administered 2023-01-15: 40 meq via ORAL
  Filled 2023-01-15: qty 2

## 2023-01-15 MED ORDER — METOLAZONE 2.5 MG PO TABS
2.5000 mg | ORAL_TABLET | Freq: Once | ORAL | Status: AC
  Administered 2023-01-15: 2.5 mg via ORAL
  Filled 2023-01-15: qty 1

## 2023-01-15 NOTE — Progress Notes (Signed)
PROGRESS NOTE    [name]  ZOX:[id number] DOB: November 25, 1970 DOA: 01/06/2023 PCP: Marcine Matar, MD 52/F with history of chronic diastolic CHF, COPD, history of DVT on Eliquis, type 2 diabetes, diabetic polyneuropathy on gabapentin, recent admission for dizziness (11/25/22-11/30/22-Home p.o. Lasix 80 mg daily was held), discharged to SNF, and from there, recently returned home where she lives with her elderly mother, who presents to the ED via EMS due to bilateral lower extremity edema all the way up to her abdomen.   - gained about 50 pounds in less than a month.  Dry weight 278 pounds and currently weighs about 332 pounds.  Minimally ambulatory -In the ED, noted to be volume overload.  IV Lasix was initiated.  Chest x-ray revealed rounded 3.1 cm density along the paramediastinal border on the right concerning for mass, CT chest chronically occluded/atretic infrarenal IVC, with bulky bilateral body wall venous collaterals likely communicating with the azygous/hemi azygous system.   Subjective: -Breathing continues to improve, main complaint is her leg pains, chronic neuropathy symptoms, requests IV morphine  Assessment and Plan:  Anasarca Acute on chronic diastolic CHF -History of 50 pound weight gain after Lasix was held at discharge over a month ago -Last echo 7/24 with EF of 55-60%, indeterminate diastolic parameters, RV is normal -Continue IV Lasix, down 31 LB, continue Jardiance and Aldactone -Likely needs another 24 to 48 hours of diuretics IV -Educated on cutting out processed foods -Increase activity, BMP in a.m. -TOC following for SNF, heart failure TOC clinic follow-up made  Lymphedema -In addition to above  Chronic pain, neuropathy -Continue oxycodone, gabapentin, educated on importance of avoiding IV narcotics -Increase gabapentin  Type 2 diabetes mellitus with morbid obesity (HCC) Stable. Add SSI. -A1c is 5.4  Hyperlipidemia with target LDL less than  100 Stable. On lipitor 40 mg daily.  OSA (obstructive sleep apnea) -Allegedly intolerant to CPAP  Depression Chronic. On paxil 20 mg daily.  Morbid obesity with BMI of 50.0-59.9, adult (HCC) BMI 56.08. pt is not a CIR candidate due to poor social support after discharge. Pt will need SNF placement. CM aware.  History of DVT (deep vein thrombosis) On chronic Eliquis due to hx of DVT and IVC filter.  Essential hypertension Stable. Continue with lasix. Holding norvasc due to borderline BP.  Chronic anemia Stable. On oral iron.  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 On eliquis.  DVT prophylaxis:  apixaban (ELIQUIS) tablet 5 mg     Code Status: Full Code Family Communication: no family at bedside Disposition Plan: will need SNF placement. CIR has declined patient  Objective: Vitals:   01/15/23 0500 01/15/23 0527 01/15/23 0801 01/15/23 0832  BP:  (!) 103/58 115/65   Pulse:  90 90   Resp:  18    Temp:  98 F (36.7 C) 98.2 F (36.8 C)   TempSrc:  Oral Oral   SpO2:  94% 98% 94%  Weight: 135.9 kg     Height:        Intake/Output Summary (Last 24 hours) at 01/15/2023 1134 Last data filed at 01/15/2023 1041 Gross per 24 hour  Intake 480 ml  Output 1800 ml  Net -1320 ml   Filed Weights   01/13/23 0500 01/14/23 0546 01/15/23 0500  Weight: (!) 138.6 kg (!) 137.5 kg 135.9 kg    Examination:  Morbidly obese chronically ill female sitting up in bed, AAOx3, starters as she speaks, mild cognitive deficits HEENT: Neck obese unable to assess JVD  CVS: S1-S2, regular rhythm Lungs: Distant breath sounds otherwise clear Abdomen: Soft, obese, nontender,  abdominal wall edema improved Extremities: 1+ edema   Data Reviewed: I have personally reviewed following labs and imaging studies  CBC: Recent Labs  Lab 01/12/23 0732  WBC 5.8  HGB 9.7*  HCT 33.9*  MCV 92.4  PLT 175   Basic Metabolic Panel: Recent Labs  Lab 01/09/23 0442 01/10/23 0552  01/11/23 0655 01/12/23 0732 01/13/23 1127 01/14/23 0753 01/15/23 0741  NA 137 136 136 135 137 136 134*  K 3.6 3.7 4.0 3.7 3.6 3.8 3.5  CL 101 97* 95* 96* 94* 98 97*  CO2 28 31 29 26 28 30 29   GLUCOSE 103* 86 90 114* 79 129* 126*  BUN 8 9 11 9 11 11 13   CREATININE 0.62 0.74 0.75 0.76 0.73 0.81 0.80  CALCIUM 8.6* 8.8* 9.0 9.2 9.6 9.0 9.0  MG 1.8 1.9 1.8  --   --   --   --    GFR: Estimated Creatinine Clearance: 108.9 mL/min (by C-G formula based on SCr of 0.8 mg/dL). Liver Function Tests: Recent Labs  Lab 01/10/23 0552 01/11/23 0655  AST 11* 14*  ALT 11 12  ALKPHOS 49 51  BILITOT 0.6 0.5  PROT 6.3* 6.6  ALBUMIN 2.6* 2.8*   HbA1C: No results for input(s): "HGBA1C" in the last 72 hours.  CBG: Recent Labs  Lab 01/13/23 2118 01/14/23 0740 01/14/23 1140 01/14/23 1710 01/15/23 0826  GLUCAP 108* 128* 81 111* 105*    No results found for this or any previous visit (from the past 240 hour(s)).   Radiology Studies: No results found.  Scheduled Meds:  apixaban  5 mg Oral BID   atorvastatin  40 mg Oral Daily   cholecalciferol  1,000 Units Oral Daily   cyanocobalamin  1,000 mcg Oral Daily   empagliflozin  10 mg Oral Daily   ferrous sulfate  325 mg Oral Q breakfast   furosemide  60 mg Intravenous BID   gabapentin  400 mg Oral TID   insulin aspart  0-15 Units Subcutaneous TID WC   insulin aspart  0-5 Units Subcutaneous QHS   mometasone-formoterol  2 puff Inhalation BID   PARoxetine  20 mg Oral Daily   potassium chloride  40 mEq Oral Daily   spironolactone  25 mg Oral Daily   Continuous Infusions:   LOS: 8 days   Time spent: 35 minutes  Zannie Cove, MD  [facility]  01/15/2023, 11:34 AM

## 2023-01-15 NOTE — Telephone Encounter (Signed)
Attempted to return her call.  She is in [facility]   I dialed her room twice at [phone] and let it ring several times but no answer.

## 2023-01-15 NOTE — Progress Notes (Signed)
Physical Therapy Treatment Patient Details Name: [name] MRN: [id number] DOB: [birth year] Today's Date: 01/15/2023   History of Present Illness [name] is a 51 y.o. female presented with volume overload. She had recent admission for dizziness (11/25/22-11/30/22), discharged to SNF, and from there, recently returned home where she lives with her elderly mother and now presents to the ED via EMS due to bilateral lower extremity edema all the way up to her abdomen. Since returning home from SNF, she rarely got out of bed due to her excessive peripheral edema and diabetic polyneuropathy. PHMx: chronic diastolic CHF, COPD, history of DVT on Eliquis, type 2 diabetes, diabetic polyneuropathy on gabapentin.    PT Comments  Pt greeted up in chair on arrival and agreeable to session. PT declining gait trials, however with fair tolerance for LE therex for increased strength and ROM with cues for technique. Educated pt on importance of continued mobility, including gait, with pt verbalizing understanding. Pt continues to benefit from skilled PT services to progress toward functional mobility goals.     If plan is discharge home, recommend the following: A little help with walking and/or transfers;Assistance with cooking/housework;Help with stairs or ramp for entrance;A little help with bathing/dressing/bathroom;Assist for transportation   Can travel by private vehicle     Yes  Equipment Recommendations  None recommended by PT    Recommendations for Other Services       Precautions / Restrictions Precautions Precautions: Fall Restrictions Weight Bearing Restrictions: No     Mobility  Bed Mobility Overal bed mobility: Needs Assistance             General bed mobility comments: up OOB on arrival    Transfers Overall transfer level: Needs assistance Equipment used: Rolling walker (2 wheels) Transfers: Sit to/from Stand Sit to Stand: Supervision            General transfer comment: supervision for safety, light cues for hand placement    Ambulation/Gait               General Gait Details: pt declining ambulation   Stairs             Wheelchair Mobility     Tilt Bed    Modified Rankin (Stroke Patients Only)       Balance Overall balance assessment: Needs assistance Sitting-balance support: Feet supported Sitting balance-Leahy Scale: Good     Standing balance support: Reliant on assistive device for balance, Bilateral upper extremity supported Standing balance-Leahy Scale: Poor Standing balance comment: able to perform pericare in standing with single UE support in bathrrom                            Cognition Arousal: Alert Behavior During Therapy: WFL for tasks assessed/performed, Anxious Overall Cognitive Status: Within Functional Limits for tasks assessed                                          Exercises General Exercises - Lower Extremity Ankle Circles/Pumps: AROM, Both, 10 reps (emcouraged pt to perform throughout day) Long Arc Quad: Strengthening, Right, Left, 10 reps, Seated (with 2 second hold at end range) Hip Flexion/Marching: AROM, Right, Left, 10 reps, Seated Other Exercises Other Exercises: standing marching x20, x2 sets Other Exercises: serial sit<>stand x5 Other Exercises: chest press/punches x30 seconds x2 sets  General Comments        Pertinent Vitals/Pain Pain Assessment Pain Assessment: Faces Faces Pain Scale: Hurts a little bit Pain Location: LLE Pain Descriptors / Indicators: Aching, Burning Pain Intervention(s): Limited activity within patient's tolerance, Monitored during session    Home Living                          Prior Function            PT Goals (current goals can now be found in the care plan section) Acute Rehab PT Goals Patient Stated Goal: return to independent PT Goal Formulation: With patient Time For Goal  Achievement: 01/22/23 Progress towards PT goals: Progressing toward goals    Frequency    Min 1X/week      PT Plan      Co-evaluation              AM-PAC PT "6 Clicks" Mobility   Outcome Measure  Help needed turning from your back to your side while in a flat bed without using bedrails?: A Little Help needed moving from lying on your back to sitting on the side of a flat bed without using bedrails?: A Little Help needed moving to and from a bed to a chair (including a wheelchair)?: A Little Help needed standing up from a chair using your arms (e.g., wheelchair or bedside chair)?: A Little Help needed to walk in hospital room?: A Little Help needed climbing 3-5 steps with a railing? : Total 6 Click Score: 16    End of Session   Activity Tolerance: Patient tolerated treatment well;Patient limited by fatigue Patient left: with call bell/phone within reach;in chair;with nursing/sitter in room Nurse Communication: Mobility status PT Visit Diagnosis: Muscle weakness (generalized) (M62.81);Other abnormalities of gait and mobility (R26.89);History of falling (Z91.81)     Time: 9147-8295 PT Time Calculation (min) (ACUTE ONLY): 18 min  Charges:    $Therapeutic Exercise: 8-22 mins PT General Charges $$ ACUTE PT VISIT: 1 Visit                     [name] R. PTA Acute Rehabilitation Services Office: [phone]   [name] 01/15/2023, 12:18 PM

## 2023-01-15 NOTE — Telephone Encounter (Signed)
Left message on pt.'s phone to call back.

## 2023-01-15 NOTE — Plan of Care (Signed)

## 2023-01-15 NOTE — Progress Notes (Signed)
   Heart Failure Stewardship Pharmacist Progress Note   PCP: Marcine Matar, MD PCP-Cardiologist: Bryan Lemma, MD    HPI:  52 yo F with PMH of CHF, COPD, DVT, T2DM, and diabetic polyneuropathy.  Presented to the ED on 9/9 with LE edema and cellulitis. She was released from SNF rehab 3 days prior and was not able to get assistance with taking care of herself at home. She rarely got out of the bed due to her excessive peripheral edema. She reports she gained about 50 lbs in a month with being in the SNF. Last ECHO 7/31 showed EF 60-65%, no RWMA, RV normal.   Current HF Medications: Diuretic: furosemide 60 mg IV BID MRA: spironolactone 25 mg daily SGLT2i: Jardiance 10 mg daily  Prior to admission HF Medications: Diuretic: furosemide 80 mg daily SGLT2i: Farxiga 10 mg daily  Pertinent Lab Values: Serum creatinine 0.80, BUN 13, Potassium 3.5, Sodium 134, BNP 43.7, Magnesium 1.8, A1c 5.4   Vital Signs: Weight: 299 lbs (admission weight: 332 lbs - reports dry weight 278 lbs) Blood pressure: 110/60s  Heart rate: 90s  I/O: net -1.5L yesterday; net -11.6L since admission  Medication Assistance / Insurance Benefits Check: Does the patient have prescription insurance?  Yes Type of insurance plan: [place] Medicaid  Outpatient Pharmacy:  Prior to admission outpatient pharmacy: [facility] Is the patient willing to use [facility] TOC pharmacy at discharge? Yes  Assessment: 1. Acute on chronic diastolic CHF (LVEF 60-65%). NYHA class III symptoms. - Continue furosemide 60 mg IV BID. Input increased with higher dose and creatinine stable. Consider dose of metolazone to augment diuresis. Strict I/Os and daily weights. Keep K>4 and Mg>2. Dry weight ~278 lbs.  - Continue spironolactone 25 mg daily - Concerned for adverse effects of SGLT2i with lack of motivation to keep up with her health. Needs motivation and encouragement to get out of bed and use the bedside commode.    Plan: 1)  Medication changes recommended at this time: - Metolazone 2.5 mg x 1 today  2) Patient assistance: - None pending, has [place] Medicaid - Possible CIR / SNF at discharge  3)  Education  - To be completed prior to discharge  Sharen Hones, PharmD, BCPS Heart Failure Stewardship Pharmacist Phone [phone]

## 2023-01-15 NOTE — Telephone Encounter (Signed)
Attempted to return her call on her room phone in the hospital and on her mobile phone [phone].   Left a voicemail on her mobile phone to call back to talk with a nurse.  Unable to leave a voicemail on hospital phone [phone].

## 2023-01-15 NOTE — Telephone Encounter (Signed)
Message from [place] B sent at 01/15/2023  1:56 PM EDT  Summary: med?   Pt called in, she is in the hospital at [name] and wants to know should she still be taking the meds given by Dr Laural Benes. She is taking the meds, that hospital has given her now.          Call History  Contact Date/Time Type Contact Phone/Fax User  01/15/2023 01:55 PM EDT Phone (Incoming) Laural Benes, Patsey Estancia (Self) [phone] Eliseo Gum, Deedra Ehrich

## 2023-01-16 ENCOUNTER — Encounter (HOSPITAL_COMMUNITY): Payer: Self-pay | Admitting: Internal Medicine

## 2023-01-16 DIAGNOSIS — R601 Generalized edema: Secondary | ICD-10-CM | POA: Diagnosis not present

## 2023-01-16 DIAGNOSIS — I5032 Chronic diastolic (congestive) heart failure: Secondary | ICD-10-CM

## 2023-01-16 DIAGNOSIS — R600 Localized edema: Secondary | ICD-10-CM | POA: Diagnosis not present

## 2023-01-16 LAB — GLUCOSE, CAPILLARY
Glucose-Capillary: 99 mg/dL (ref 70–99)
Glucose-Capillary: 99 mg/dL (ref 70–99)
Glucose-Capillary: 123 mg/dL — ABNORMAL HIGH (ref 70–99)
Glucose-Capillary: 107 mg/dL — ABNORMAL HIGH (ref 70–99)

## 2023-01-16 LAB — BASIC METABOLIC PANEL WITH GFR
Anion gap: 12 (ref 5–15)
BUN: 20 mg/dL (ref 6–20)
CO2: 29 mmol/L (ref 22–32)
Calcium: 9.7 mg/dL (ref 8.9–10.3)
Chloride: 95 mmol/L — ABNORMAL LOW (ref 98–111)
Creatinine, Ser: 0.88 mg/dL (ref 0.44–1.00)
GFR, Estimated: >60 mL/min
Glucose, Bld: 148 mg/dL — ABNORMAL HIGH (ref 70–99)
Potassium: 3.7 mmol/L (ref 3.5–5.1)
Sodium: 136 mmol/L (ref 135–145)

## 2023-01-16 MED ORDER — FUROSEMIDE 40 MG PO TABS: 40.0000 mg | ORAL_TABLET | Freq: Two times a day (BID) | ORAL | Status: DC

## 2023-01-16 MED ORDER — HYDROXYZINE HCL 25 MG PO TABS: 25.0000 mg | ORAL_TABLET | Freq: Every evening | ORAL | Status: DC | PRN

## 2023-01-16 MED ORDER — FUROSEMIDE 40 MG PO TABS
40.0000 mg | ORAL_TABLET | Freq: Two times a day (BID) | ORAL | Status: DC
  Administered 2023-01-16 – 2023-01-17 (×3): 40 mg via ORAL
  Filled 2023-01-16 (×3): qty 1

## 2023-01-16 MED ORDER — METOLAZONE 5 MG PO TABS
5.0000 mg | ORAL_TABLET | Freq: Every day | ORAL | Status: DC
  Administered 2023-01-16 – 2023-01-18 (×3): 5 mg via ORAL
  Filled 2023-01-16 (×3): qty 1

## 2023-01-16 MED ORDER — TRAZODONE HCL 50 MG PO TABS
50.0000 mg | ORAL_TABLET | Freq: Every evening | ORAL | Status: DC | PRN
  Administered 2023-01-16 – 2023-01-17 (×2): 50 mg via ORAL
  Filled 2023-01-16 (×2): qty 1

## 2023-01-16 NOTE — Progress Notes (Addendum)
Heart Failure Nurse Navigator Progress Note  PCP: Marcine Matar, MD PCP-Cardiologist: Herbie Baltimore Admission Diagnosis: None Admitted from: Home via EMS   Presentation:   [name] presented with edema and cellulitis of legs, just released from a rehab center 3 days prior, and wasn't able to get assistance to take care of herself at home. Rarely got out of bed, reported she gained about 50 pounds in a month,   Patient was educated on the sign and symptoms of heart failure, daily weights, when to call her doctor or go to the ED. Diet/ fluid restrictions, taking all her medication as prescribed , attending all medical appointments and increasing her mobility. Patient verbalized her understanding, a HF TOC appointment was scheduled per Dr. Jomarie Longs request on 02/03/2023 @ 2 pm.   ECHO/ LVEF: 60-65%  Clinical Course:  Past Medical History:  Diagnosis Date   (HFpEF) heart failure with preserved ejection fraction (HCC) -> although echo suggests normal diastolic parameters with normal left atrial size 04/13/2020   Arthritis    Cellulitis    COPD (chronic obstructive pulmonary disease) (HCC)    Coronary artery disease, non-occlusive    OBSTRUCTIVE: PT STATES - HAD A CARDIAC CATH - NOT TOLD SHE HAD CAD -> week note from [place] indicates history of MI (patient cannot corroborate   Diabetes mellitus without complication (HCC)    DVT (deep venous thrombosis) (HCC) 09/17/2017   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   Morbid obesity due to excess calories (HCC)    OSA (obstructive sleep apnea) 07/11/2021   Sleep study April 06, 2019  (Dr. Craige Cotta): Moderate OSA (AHI of 24.4 and SPO2 low of 77%).  Majority events during REM sleep.-recommend CPAP, oral appliance or surgical. Reviewed sleep hygiene.  Avoid sedatives.     Osteoarthritis of left knee    Prediabetes    Small bowel obstruction (HCC)    as a child   Speech impediment    Stutter / stammer     Social History   Socioeconomic History   Marital status: Single    Spouse name: Not on file   Number of children: 0   Years of education: Not on file   Highest education level: Some college, no degree  Occupational History   Occupation: unemployed on disablity  Tobacco Use   Smoking status: Former    Current packs/day: 0.00    Average packs/day: 0.3 packs/day for 18.0 years (4.5 ttl pk-yrs)    Types: Cigarettes    Start date: 04/29/1984    Quit date: 04/29/2002    Years since quitting: 20.7   Smokeless tobacco: Never  Vaping Use   Vaping status: Never Used  Substance and Sexual Activity   Alcohol use: Not Currently   Drug use: Not Currently   Sexual activity: Not Currently  Other Topics Concern   Not on file  Social History Narrative   Not on file   Social Determinants of Health   Financial Resource Strain: Low Risk  (09/02/2022)   Overall Financial Resource Strain (CARDIA)    Difficulty of Paying Living Expenses: Not hard at all  Food Insecurity:  No Food Insecurity (01/07/2023)   Hunger Vital Sign    Worried About Running Out of Food in the Last Year: Never true    Ran Out of Food in the Last Year: Never true  Transportation Needs: No Transportation Needs (01/07/2023)   PRAPARE - Administrator, Civil Service (Medical): No    Lack of Transportation (Non-Medical): No  Physical Activity: Insufficiently Active (09/02/2022)   Exercise Vital Sign    Days of Exercise per Week: 3 days    Minutes of Exercise per Session: 20 min  Stress: No Stress Concern Present (09/02/2022)   [employer] of Occupational Health - Occupational Stress Questionnaire     Feeling of Stress : Only a little  Social Connections: Moderately Isolated (09/02/2022)   Social Connection and Isolation Panel [NHANES]    Frequency of Communication with Friends and Family: More than three times a week    Frequency of Social Gatherings with Friends and Family: Twice a week    Attends Religious Services: More than 4 times per year    Active Member of [employer] or Organizations: No    Attends Engineer, structural: Not on file    Marital Status: Never married   Education Assessment and Provision:  Detailed education and instructions provided on heart failure disease management including the following:  Signs and symptoms of Heart Failure When to call the physician Importance of daily weights Low sodium diet Fluid restriction Medication management Anticipated future follow-up appointments  Patient education given on each of the above topics.  Patient acknowledges understanding via teach back method and acceptance of all instructions.  Education Materials:  "Living Better With Heart Failure" Booklet, HF zone tool, & Daily Weight Tracker Tool.  Patient has scale at home: yes  Patient has pill box at home: yes    High Risk Criteria for Readmission and/or Poor Patient Outcomes: Heart failure hospital admissions (last 6 months): 0  No Show rate: 7% Difficult social situation: yes, lives with her mother and brother.  Demonstrates medication adherence: yes Primary Language: english  Literacy level: Reading, writing, and comprehension  Barriers of Care:   Diet/ fluid restrictions Daily weights Limit mobility  Considerations/Referrals:   Referral made to Heart Failure Pharmacist Stewardship: Yes Referral made to Heart Failure CSW/NCM TOC: no Referral made to Heart & Vascular TOC clinic: Yes, per Dr. Jomarie Longs, HF [facility] 02/03/2023 @ 2 pm.   Items for Follow-up on DC/TOC: Continued HF education Diet/ fluid restrictions/ daily weights Encouragement of  mobility   [name], BSN, Scientist, clinical (histocompatibility and immunogenetics) Only

## 2023-01-16 NOTE — Progress Notes (Signed)
   Heart Failure Stewardship Pharmacist Progress Note   PCP: Marcine Matar, MD PCP-Cardiologist: Bryan Lemma, MD    HPI:  52 yo F with PMH of CHF, COPD, DVT, T2DM, and diabetic polyneuropathy.  Presented to the ED on 9/9 with LE edema and cellulitis. She was released from SNF rehab 3 days prior and was not able to get assistance with taking care of herself at home. She rarely got out of the bed due to her excessive peripheral edema. She reports she gained about 50 lbs in a month with being in the SNF. Last ECHO 7/31 showed EF 60-65%, no RWMA, RV normal.   Current HF Medications: Diuretic: furosemide 40 mg PO BID MRA: spironolactone 25 mg daily SGLT2i: Jardiance 10 mg daily  Prior to admission HF Medications: Diuretic: furosemide 80 mg daily SGLT2i: Farxiga 10 mg daily  Pertinent Lab Values: As of 9/18: Serum creatinine 0.80, BUN 13, Potassium 3.5, Sodium 134, BNP 43.7, Magnesium 1.8, A1c 5.4   Vital Signs: Weight: 294 lbs (admission weight: 332 lbs - reports dry weight 278 lbs) Blood pressure: 100/60s  Heart rate: 90s  I/O: net -3.4L yesterday; net -14.5L since admission  Medication Assistance / Insurance Benefits Check: Does the patient have prescription insurance?  Yes Type of insurance plan: [place] Medicaid  Outpatient Pharmacy:  Prior to admission outpatient pharmacy: [facility] Is the patient willing to use [facility] TOC pharmacy at discharge? Yes  Assessment: 1. Acute on chronic diastolic CHF (LVEF 60-65%). NYHA class III symptoms. - Excellent output with addition of metolazone yesterday. Now transitioned to furosemide 40 mg PO BID. Still about 20 lbs over reported dry weight. Pending labs today, may need to continue IV diuresis until creatinine bumps since fluid status difficult to observe with her body habitus. Strict I/Os and daily weights. Keep K>4 and Mg>2. - Continue spironolactone 25 mg daily - Concerned for adverse effects of SGLT2i with lack of  motivation to keep up with her health. Needs motivation and encouragement to get out of bed and use the bedside commode. Relies on her mother and her brother to help take care of her. She states she wears depends. Educated on risks vs benefits and patient is aware of what to look out for if she develops a UTI.    Plan: 1) Medication changes recommended at this time: - Pending labs, may need to continue IV diuresis  2) Patient assistance: - None pending, has [place] Medicaid - Possible CIR / SNF at discharge  3)  Education  - Patient has been educated on current HF medications and potential additions to HF medication regimen - Patient verbalizes understanding that over the next few months, these medication doses may change and more medications may be added to optimize HF regimen - Patient has been educated on basic disease state pathophysiology and goals of therapy   Sharen Hones, PharmD, BCPS Heart Failure Stewardship Pharmacist Phone [phone]

## 2023-01-16 NOTE — Telephone Encounter (Signed)
Forwarded this to [facility] per policy after 3 attempts to contact pt unsuccessfully.

## 2023-01-16 NOTE — Progress Notes (Signed)
Occupational Therapy Treatment Patient Details Name: [name] MRN: [id number] DOB: 08-Apr-1971 Today's Date: 01/16/2023   History of present illness [name] is a 52 y.o. female presented with volume overload. She had recent admission for dizziness (11/25/22-11/30/22), discharged to SNF, and from there, recently returned home where she lives with her elderly mother and now presents to the ED via EMS due to bilateral lower extremity edema all the way up to her abdomen. Since returning home from SNF, she rarely got out of bed due to her excessive peripheral edema and diabetic polyneuropathy. PHMx: chronic diastolic CHF, COPD, history of DVT on Eliquis, type 2 diabetes, diabetic polyneuropathy on gabapentin.   OT comments  Patient making good gains with OT treatment with transfers, toilet transfers, and LB bathing. Patient continues to require max assist for LB dressing and would benefit from AE training to increase independence. Patient will benefit from continued inpatient follow up therapy, <3 hours/day to further address LB ADLs with AE and functional transfers. Acute OT to continue to follow.       If plan is discharge home, recommend the following:  A lot of help with bathing/dressing/bathroom;Assistance with cooking/housework;Direct supervision/assist for medications management;Direct supervision/assist for financial management;Assist for transportation;Help with stairs or ramp for entrance;A little help with walking and/or transfers   Equipment Recommendations  None recommended by OT    Recommendations for Other Services      Precautions / Restrictions Precautions Precautions: Fall Restrictions Weight Bearing Restrictions: No       Mobility Bed Mobility Overal bed mobility: Needs Assistance             General bed mobility comments: seated on EOB at beginning and end of session    Transfers Overall transfer level: Needs assistance Equipment used:  Rolling walker (2 wheels) Transfers: Sit to/from Stand Sit to Stand: Supervision           General transfer comment: supervision for safety and cues for hand placement     Balance Overall balance assessment: Needs assistance Sitting-balance support: Feet supported Sitting balance-Leahy Scale: Good Sitting balance - Comments: seated on EOB upon entry   Standing balance support: Single extremity supported, Bilateral upper extremity supported, During functional activity Standing balance-Leahy Scale: Poor Standing balance comment: able to perform pericare while standing with one extremity support with sink                           ADL either performed or assessed with clinical judgement   ADL Overall ADL's : Needs assistance/impaired     Grooming: Set up;Standing   Upper Body Bathing: Set up;Standing;Sitting   Lower Body Bathing: Moderate assistance;Sit to/from stand Lower Body Bathing Details (indicate cue type and reason): assistance for below knees Upper Body Dressing : Set up;Sitting   Lower Body Dressing: Maximal assistance;Sit to/from stand Lower Body Dressing Details (indicate cue type and reason): to donn socks Toilet Transfer: Rolling walker (2 wheels);Supervision/safety Statistician Details (indicate cue type and reason): simulated                Extremity/Trunk Assessment              Vision       Perception     Praxis      Cognition Arousal: Alert Behavior During Therapy: WFL for tasks assessed/performed, Anxious Overall Cognitive Status: Within Functional Limits for tasks assessed  General Comments: alert and oriented        Exercises      Shoulder Instructions       General Comments      Pertinent Vitals/ Pain       Pain Assessment Pain Assessment: Faces Faces Pain Scale: Hurts a little bit Pain Location: LLE Pain Descriptors / Indicators: Aching, Burning Pain  Intervention(s): Monitored during session, Repositioned  Home Living                                          Prior Functioning/Environment              Frequency  Min 1X/week        Progress Toward Goals  OT Goals(current goals can now be found in the care plan section)  Progress towards OT goals: Progressing toward goals  Acute Rehab OT Goals Patient Stated Goal: get better OT Goal Formulation: With patient Time For Goal Achievement: 01/21/23 Potential to Achieve Goals: Good ADL Goals Pt Will Perform Lower Body Dressing: with min assist;sit to/from stand;with adaptive equipment Pt Will Transfer to Toilet: with contact guard assist;stand pivot transfer;bedside commode Pt Will Perform Toileting - Clothing Manipulation and hygiene: with contact guard assist;sit to/from stand Additional ADL Goal #1: Pt will tolerate at least 5 minutes of standing as a precursor to OOB ADLs  Plan      Co-evaluation                 AM-PAC OT "6 Clicks" Daily Activity     Outcome Measure   Help from another person eating meals?: None Help from another person taking care of personal grooming?: A Little Help from another person toileting, which includes using toliet, bedpan, or urinal?: A Lot Help from another person bathing (including washing, rinsing, drying)?: A Lot Help from another person to put on and taking off regular upper body clothing?: A Little Help from another person to put on and taking off regular lower body clothing?: A Lot 6 Click Score: 16    End of Session Equipment Utilized During Treatment: Rolling walker (2 wheels)  OT Visit Diagnosis: Muscle weakness (generalized) (M62.81)   Activity Tolerance Patient tolerated treatment well   Patient Left in bed;with call bell/phone within reach;with bed alarm set (on EOB)   Nurse Communication Mobility status        Time: 1001-1026 OT Time Calculation (min): 25 min  Charges: OT General  Charges $OT Visit: 1 Visit OT Treatments $Self Care/Home Management : 8-22 mins $Therapeutic Activity: 8-22 mins  Alfonse Flavors, OTA Acute Rehabilitation Services  Office [phone]   Dewain Penning 01/16/2023, 1:28 PM

## 2023-01-16 NOTE — Plan of Care (Signed)

## 2023-01-16 NOTE — Telephone Encounter (Addendum)
Left message on voicemail to return call.

## 2023-01-16 NOTE — Progress Notes (Signed)
PROGRESS NOTE    [name]  UJW:[id number] DOB: 1970-07-01 DOA: 01/06/2023 PCP: Marcine Matar, MD 52/F with history of chronic diastolic CHF, COPD, history of DVT on Eliquis, type 2 diabetes, diabetic polyneuropathy on gabapentin, recent admission for dizziness (11/25/22-11/30/22-Home p.o. Lasix 80 mg daily was held), discharged to SNF, and from there, recently returned home where she lives with her elderly mother, who presents to the ED via EMS due to bilateral lower extremity edema all the way up to her abdomen.   - gained about 50 pounds in less than a month.  Dry weight 278 pounds and currently weighs about 332 pounds.  Minimally ambulatory -In the ED, noted to be volume overload.  IV Lasix was initiated.  Chest x-ray revealed rounded 3.1 cm density along the paramediastinal border on the right concerning for mass, CT chest chronically occluded/atretic infrarenal IVC, with bulky bilateral body wall venous collaterals likely communicating with the azygous/hemi azygous system.   Subjective: Pt reports feeling improved every day. Has good UOP and improvement in her leg swelling. Does not have respiratory distress at rest. She was told she was not approved by CIR and will be going to SNF.   Assessment and Plan:  Anasarca Acute on chronic diastolic CHF -History of 50 pound weight gain after Lasix was held at discharge over a month ago. Last echo 7/24 with EF of 55-60%, indeterminate diastolic parameters, RV is normal - given that her edema is now non-pitting, transitioning to PO lasix plus metolazone. Had good response to this previously and renal function tolerated.  - continue Jardiance and Aldactone -TOC following for SNF, heart failure TOC clinic follow-up made  Lymphedema -In addition to above  Chronic pain, neuropathy -Continue oxycodone, gabapentin, educated on importance of avoiding IV narcotics -Increased gabapentin  Type 2 diabetes mellitus with morbid obesity  (HCC)- -A1c is 5.4. in Remission.  - continue SSI.  Hyperlipidemia with target LDL less than 100 Stable. On lipitor 40 mg daily.  OSA (obstructive sleep apnea) -Allegedly intolerant to CPAP  Depression Chronic. On paxil 20 mg daily.  Morbid obesity with BMI of 50.0-59.9, adult (HCC) BMI 56.08. pt is not a CIR candidate due to poor social support after discharge. Pt will need SNF placement. CM aware.  History of DVT (deep vein thrombosis) On chronic Eliquis due to hx of DVT and IVC filter.  Essential hypertension Stable. Continue with lasix. Holding norvasc due to borderline BP.  Chronic anemia Stable. On oral iron.  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 On eliquis.  DVT prophylaxis:  apixaban (ELIQUIS) tablet 5 mg     Code Status: Full Code Family Communication: no family at bedside Disposition Plan: will need SNF placement. CIR has declined patient  Objective: Vitals:   01/15/23 2125 01/16/23 0500 01/16/23 0509 01/16/23 0732  BP:   94/62 105/67  Pulse:   96 96  Resp:   16 17  Temp:   98.7 F (37.1 C) 98.9 F (37.2 C)  TempSrc:   Oral Oral  SpO2: 93%  96% 91%  Weight:  133.7 kg    Height:        Intake/Output Summary (Last 24 hours) at 01/16/2023 0806 Last data filed at 01/16/2023 0500 Gross per 24 hour  Intake 240 ml  Output 3650 ml  Net -3410 ml   Filed Weights   01/14/23 0546 01/15/23 0500 01/16/23 0500  Weight: (!) 137.5 kg 135.9 kg 133.7 kg    Examination: Morbidly obese chronically  ill female sitting up in bed, AAOx3, starters as she speaks, mild cognitive deficits HEENT: Neck obese unable to assess JVD CVS: S1-S2, regular rhythm Lungs: Distant breath sounds otherwise clear Abdomen: Soft, obese, nontender,  abdominal wall edema improved Extremities: chronic edema changes, non-pitting  Data Reviewed: I have personally reviewed following labs and imaging studies  CBC: Recent Labs  Lab 01/12/23 0732  WBC 5.8  HGB 9.7*   HCT 33.9*  MCV 92.4  PLT 175   Basic Metabolic Panel: Recent Labs  Lab 01/10/23 0552 01/11/23 0655 01/12/23 0732 01/13/23 1127 01/14/23 0753 01/15/23 0741  NA 136 136 135 137 136 134*  K 3.7 4.0 3.7 3.6 3.8 3.5  CL 97* 95* 96* 94* 98 97*  CO2 31 29 26 28 30 29   GLUCOSE 86 90 114* 79 129* 126*  BUN 9 11 9 11 11 13   CREATININE 0.74 0.75 0.76 0.73 0.81 0.80  CALCIUM 8.8* 9.0 9.2 9.6 9.0 9.0  MG 1.9 1.8  --   --   --   --    GFR: Estimated Creatinine Clearance: 107.7 mL/min (by C-G formula based on SCr of 0.8 mg/dL). Liver Function Tests: Recent Labs  Lab 01/10/23 0552 01/11/23 0655  AST 11* 14*  ALT 11 12  ALKPHOS 49 51  BILITOT 0.6 0.5  PROT 6.3* 6.6  ALBUMIN 2.6* 2.8*    CBG: Recent Labs  Lab 01/15/23 0826 01/15/23 1135 01/15/23 1550 01/15/23 2117 01/16/23 0729  GLUCAP 105* 108* 95 122* 99   Radiology Studies: No results found.  Scheduled Meds:  apixaban  5 mg Oral BID   atorvastatin  40 mg Oral Daily   cholecalciferol  1,000 Units Oral Daily   cyanocobalamin  1,000 mcg Oral Daily   empagliflozin  10 mg Oral Daily   ferrous sulfate  325 mg Oral Q breakfast   furosemide  60 mg Intravenous BID   gabapentin  400 mg Oral TID   insulin aspart  0-15 Units Subcutaneous TID WC   insulin aspart  0-5 Units Subcutaneous QHS   mometasone-formoterol  2 puff Inhalation BID   PARoxetine  20 mg Oral Daily   potassium chloride  40 mEq Oral BID   spironolactone  25 mg Oral Daily     LOS: 9 days   Time spent: 35 minutes  [name], MD  [facility]  01/16/2023, 8:06 AM

## 2023-01-16 NOTE — Telephone Encounter (Signed)
Patient advised to follow the care that the hospitalist has advised. Take medication that are being administered.   She wanted Dr. Laural Benes to know that she was admitted to the hospital.

## 2023-01-17 ENCOUNTER — Telehealth: Payer: Self-pay | Admitting: *Deleted

## 2023-01-17 DIAGNOSIS — R600 Localized edema: Secondary | ICD-10-CM | POA: Diagnosis not present

## 2023-01-17 DIAGNOSIS — R601 Generalized edema: Secondary | ICD-10-CM | POA: Diagnosis not present

## 2023-01-17 LAB — BASIC METABOLIC PANEL
Anion gap: 12 (ref 5–15)
BUN: 19 mg/dL (ref 6–20)
CO2: 31 mmol/L (ref 22–32)
Calcium: 9.5 mg/dL (ref 8.9–10.3)
Chloride: 94 mmol/L — ABNORMAL LOW (ref 98–111)
Creatinine, Ser: 0.83 mg/dL (ref 0.44–1.00)
GFR, Estimated: >60 mL/min (ref >60)
Glucose, Bld: 100 mg/dL — ABNORMAL HIGH (ref 70–99)
Potassium: 3.6 mmol/L (ref 3.5–5.1)
Sodium: 137 mmol/L (ref 135–145)

## 2023-01-17 LAB — GLUCOSE, CAPILLARY
Glucose-Capillary: 99 mg/dL (ref 70–99)
Glucose-Capillary: 116 mg/dL — ABNORMAL HIGH (ref 70–99)
Glucose-Capillary: 125 mg/dL — ABNORMAL HIGH (ref 70–99)
Glucose-Capillary: 134 mg/dL — ABNORMAL HIGH (ref 70–99)

## 2023-01-17 MED ORDER — MELATONIN 5 MG PO TABS
5.0000 mg | ORAL_TABLET | Freq: Every day | ORAL | Status: DC
  Administered 2023-01-17: 5 mg via ORAL
  Filled 2023-01-17: qty 1

## 2023-01-17 MED ORDER — FUROSEMIDE 40 MG PO TABS
40.0000 mg | ORAL_TABLET | Freq: Two times a day (BID) | ORAL | Status: DC
  Administered 2023-01-17 – 2023-01-18 (×3): 40 mg via ORAL
  Filled 2023-01-17 (×3): qty 1

## 2023-01-17 NOTE — Telephone Encounter (Signed)
Copied from CRM [id number]. Topic: General - Call Back - No Documentation >> Jan 16, 2023  2:58 PM Dondra Prader E wrote: Reason for CRM: Pt returned call to the office, says to please call her back at  [phone]

## 2023-01-17 NOTE — Progress Notes (Signed)
Physical Therapy Treatment Patient Details Name: [name] MRN: [id number] DOB: 04-10-1971 Today's Date: 01/17/2023   History of Present Illness [name] is a 52 y.o. female presented with volume overload. She had recent admission for dizziness (11/25/22-11/30/22), discharged to SNF, and from there, recently returned home where she lives with her elderly mother and now presents to the ED via EMS due to bilateral lower extremity edema all the way up to her abdomen. Since returning home from SNF, she rarely got out of bed due to her excessive peripheral edema and diabetic polyneuropathy. PHMx: chronic diastolic CHF, COPD, history of DVT on Eliquis, type 2 diabetes, diabetic polyneuropathy on gabapentin.    PT Comments  Pt is slowly progressing towards goals. Pt lives with her elderly mother and currently her brother is at their home assisting who is an MD due to her mother just had cataract surgery. Pt currently requires supervision for bed mobility, sit to stand and gait with Min assist for verbal cueing in order to prevent LOB and falls. Due to pt PLOF, home set up and available assistance at home currently recommending skilled physical therapy services > 3 hours/day in order to address strength, balance and functional mobility to decrease risk for falls, injury and re-hospitalization to return to PLOF.     If plan is discharge home, recommend the following: A little help with walking and/or transfers;Assistance with cooking/housework;Help with stairs or ramp for entrance;Assist for transportation   Can travel by private vehicle     Yes  Equipment Recommendations  None recommended by PT       Precautions / Restrictions Precautions Precautions: Fall Restrictions Weight Bearing Restrictions: No     Mobility  Bed Mobility Overal bed mobility: Needs Assistance Bed Mobility: Supine to Sit     Supine to sit: Supervision, Used rails, HOB elevated     General bed  mobility comments: Pt seated EOB at end of session on pt request    Transfers Overall transfer level: Needs assistance Equipment used: Rolling walker (2 wheels) Transfers: Sit to/from Stand Sit to Stand: Supervision           General transfer comment: supervision for safety and cues for hand placement    Ambulation/Gait Ambulation/Gait assistance: Contact guard assist Gait Distance (Feet): 100 Feet Assistive device: Rolling walker (2 wheels) Gait Pattern/deviations: Step-to pattern, Decreased stride length, Wide base of support Gait velocity: slow Gait velocity interpretation: <1.8 ft/sec, indicate of risk for recurrent falls   General Gait Details: frontal plane sway, verbal cues for body habitus distance from [employer]:  (not applicable pt has level entrance)             Balance Overall balance assessment: Needs assistance Sitting-balance support: Feet supported Sitting balance-Leahy Scale: Good     Standing balance support: Single extremity supported, Bilateral upper extremity supported, During functional activity Standing balance-Leahy Scale: Fair Standing balance comment: No overt LOB        Cognition Arousal: Alert Behavior During Therapy: WFL for tasks assessed/performed, Anxious Overall Cognitive Status: Within Functional Limits for tasks assessed                Pertinent Vitals/Pain Pain Assessment Pain Assessment: No/denies pain     PT Goals (current goals can now be found in the care plan section) Acute Rehab PT Goals Patient Stated Goal: return to independent PT Goal Formulation: With patient Time For Goal Achievement: 01/22/23 Potential to Achieve Goals: Good Progress towards PT  goals: Progressing toward goals    Frequency    Min 1X/week      PT Plan  Continue with current POC       AM-PAC PT "6 Clicks" Mobility   Outcome Measure  Help needed turning from your back to your side while in a flat bed without using  bedrails?: A Little Help needed moving from lying on your back to sitting on the side of a flat bed without using bedrails?: A Little Help needed moving to and from a bed to a chair (including a wheelchair)?: A Little Help needed standing up from a chair using your arms (e.g., wheelchair or bedside chair)?: A Little Help needed to walk in hospital room?: A Little Help needed climbing 3-5 steps with a railing? : A Lot 6 Click Score: 17    End of Session   Activity Tolerance: Patient tolerated treatment well Patient left: with call bell/phone within reach;in bed Nurse Communication: Mobility status PT Visit Diagnosis: Muscle weakness (generalized) (M62.81);Other abnormalities of gait and mobility (R26.89);History of falling (Z91.81)     Time: 0912-0928 PT Time Calculation (min) (ACUTE ONLY): 16 min  Charges:    $Gait Training: 8-22 mins PT General Charges $$ ACUTE PT VISIT: 1 Visit                     Harrel Carina, DPT, CLT  Acute Rehabilitation Services Office: [phone] (Secure chat preferred)    [name] 01/17/2023, 9:36 AM

## 2023-01-17 NOTE — Progress Notes (Signed)
PROGRESS NOTE    [name]  WJX:[id number] DOB: Nov 04, 1970 DOA: 01/06/2023 PCP: Marcine Matar, MD 52/F with history of chronic diastolic CHF, COPD, history of DVT on Eliquis, type 2 diabetes, diabetic polyneuropathy on gabapentin, recent admission for dizziness (11/25/22-11/30/22-Home p.o. Lasix 80 mg daily was held), discharged to SNF, and from there, recently returned home where she lives with her elderly mother, who presents to the ED via EMS due to bilateral lower extremity edema all the way up to her abdomen.   - gained about 50 pounds in less than a month.  Dry weight 278 pounds and currently weighs about 332 pounds.  Minimally ambulatory -In the ED, noted to be volume overload.  IV Lasix was initiated.  Chest x-ray revealed rounded 3.1 cm density along the paramediastinal border on the right concerning for mass, CT chest chronically occluded/atretic infrarenal IVC, with bulky bilateral body wall venous collaterals likely communicating with the azygous/hemi azygous system.   Subjective: Pt reports feeling improved every day. Complains of poor sleep quality due to frequent urination from the diuretics.   Assessment and Plan:  Anasarca Acute on chronic diastolic CHF -History of 50 pound weight gain after Lasix was held at discharge over a month ago. Last echo 7/24 with EF of 55-60%, indeterminate diastolic parameters, RV is normal - given that her edema is now non-pitting, transitioning to PO lasix plus metolazone. Had good response to this previously and renal function tolerated. Timing second dose of diuretic for earlier in the day to have less impact on sleep.  - continue Jardiance and Aldactone -TOC following for SNF, heart failure TOC clinic follow-up made - can dc 9/21 to [facility]  Lymphedema -In addition to above  Chronic pain  neuropathy -Continue oxycodone, gabapentin, educated on importance of avoiding IV narcotics -Increased gabapentin  Type 2 diabetes  mellitus with morbid obesity (HCC)- -A1c is 5.4. in Remission.  - continue SSI.  Hyperlipidemia with target LDL less than 100 Stable. On lipitor 40 mg daily.  OSA (obstructive sleep apnea) -Allegedly intolerant to CPAP  Depression Chronic. On paxil 20 mg daily.  Morbid obesity with BMI of 50.0-59.9, adult (HCC)- BMI 56.08.  History of DVT (deep vein thrombosis) On chronic Eliquis due to hx of DVT and IVC filter.  Essential hypertension Stable. Continue with lasix. Holding norvasc due to borderline BP.  Chronic anemia Stable. On oral iron.  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 On eliquis.  Insomnia- requests morphine at first for sleep aid but when I declined, she now asked for melatonin  - melatonin QHS  DVT prophylaxis:  apixaban (ELIQUIS) tablet 5 mg     Code Status: Full Code Family Communication: no family at bedside Disposition Plan: dc to SNF 9/21  Objective: Vitals:   01/16/23 2030 01/17/23 0536 01/17/23 0550 01/17/23 0600  BP: 100/72 (!) 88/57 110/80   Pulse: 100 85    Resp: 16 16    Temp: 98.3 F (36.8 C) (!) 97.5 F (36.4 C)    TempSrc: Oral Oral    SpO2: 99% 96%    Weight:    132.2 kg  Height:        Intake/Output Summary (Last 24 hours) at 01/17/2023 0716 Last data filed at 01/17/2023 0409 Gross per 24 hour  Intake 720 ml  Output 3200 ml  Net -2480 ml   Filed Weights   01/15/23 0500 01/16/23 0500 01/17/23 0600  Weight: 135.9 kg 133.7 kg 132.2 kg    Examination:  Morbidly obese chronically ill female sitting up in bed, AAOx3, mild cognitive deficits HEENT: Neck obese unable to assess JVD CVS: S1-S2, regular rhythm Lungs: CTAB, normal effort Abdomen: Soft, obese, nontender, no edema Extremities: chronic edema changes, non-pitting  Data Reviewed: I have personally reviewed following labs and imaging studies  CBC: Recent Labs  Lab 01/12/23 0732  WBC 5.8  HGB 9.7*  HCT 33.9*  MCV 92.4  PLT 175   Basic  Metabolic Panel: Recent Labs  Lab 01/11/23 0655 01/12/23 0732 01/13/23 1127 01/14/23 0753 01/15/23 0741 01/16/23 1354 01/17/23 0545  NA 136   < > 137 136 134* 136 137  K 4.0   < > 3.6 3.8 3.5 3.7 3.6  CL 95*   < > 94* 98 97* 95* 94*  CO2 29   < > 28 30 29 29 31   GLUCOSE 90   < > 79 129* 126* 148* 100*  BUN 11   < > 11 11 13 20 19   CREATININE 0.75   < > 0.73 0.81 0.80 0.88 0.83  CALCIUM 9.0   < > 9.6 9.0 9.0 9.7 9.5  MG 1.8  --   --   --   --   --   --    < > = values in this interval not displayed.   GFR: Estimated Creatinine Clearance: 103.1 mL/min (by C-G formula based on SCr of 0.83 mg/dL). Liver Function Tests: Recent Labs  Lab 01/11/23 0655  AST 14*  ALT 12  ALKPHOS 51  BILITOT 0.5  PROT 6.6  ALBUMIN 2.8*    CBG: Recent Labs  Lab 01/15/23 2117 01/16/23 0729 01/16/23 1146 01/16/23 1555 01/16/23 2137  GLUCAP 122* 99 99 123* 107*   Radiology Studies: No results found.  Scheduled Meds:  apixaban  5 mg Oral BID   atorvastatin  40 mg Oral Daily   cholecalciferol  1,000 Units Oral Daily   cyanocobalamin  1,000 mcg Oral Daily   empagliflozin  10 mg Oral Daily   ferrous sulfate  325 mg Oral Q breakfast   furosemide  40 mg Oral BID   gabapentin  400 mg Oral TID   insulin aspart  0-15 Units Subcutaneous TID WC   insulin aspart  0-5 Units Subcutaneous QHS   metolazone  5 mg Oral Daily   mometasone-formoterol  2 puff Inhalation BID   PARoxetine  20 mg Oral Daily   potassium chloride  40 mEq Oral BID   spironolactone  25 mg Oral Daily     LOS: 10 days   Time spent: 35 minutes  [name], MD  [facility]  01/17/2023, 7:16 AM

## 2023-01-17 NOTE — Progress Notes (Signed)
   Heart Failure Stewardship Pharmacist Progress Note   PCP: Marcine Matar, MD PCP-Cardiologist: Bryan Lemma, MD    HPI:  52 yo F with PMH of CHF, COPD, DVT, T2DM, and diabetic polyneuropathy.  Presented to the ED on 9/9 with LE edema and cellulitis. She was released from SNF rehab 3 days prior and was not able to get assistance with taking care of herself at home. She rarely got out of the bed due to her excessive peripheral edema. She reports she gained about 50 lbs in a month with being in the SNF. Last ECHO 7/31 showed EF 60-65%, no RWMA, RV normal.   Current HF Medications: Diuretic: furosemide 40 mg PO BID + metolazone 5 mg daily MRA: spironolactone 25 mg daily SGLT2i: Jardiance 10 mg daily  Prior to admission HF Medications: Diuretic: furosemide 80 mg daily SGLT2i: Farxiga 10 mg daily  Pertinent Lab Values: Serum creatinine 0.83, BUN 19, Potassium 3.6, Sodium 137, BNP 43.7, Magnesium 1.8, A1c 5.4   Vital Signs: Weight: 291 lbs (admission weight: 332 lbs - reports dry weight 278 lbs) Blood pressure: 110/80s  Heart rate: 80s  I/O: net -1.7L yesterday; net -16.9L since admission  Medication Assistance / Insurance Benefits Check: Does the patient have prescription insurance?  Yes Type of insurance plan: [place] Medicaid  Outpatient Pharmacy:  Prior to admission outpatient pharmacy: [facility] Is the patient willing to use [facility] TOC pharmacy at discharge? Yes  Assessment: 1. Acute on chronic diastolic CHF (LVEF 60-65%). NYHA class III symptoms. - Continue furosemide 40 mg PO BID. Watch renal function closely with daily metolazone. Strict I/Os and daily weights. Keep K>4 and Mg>2. - Continue spironolactone 25 mg daily - Concerned for adverse effects of SGLT2i with lack of motivation to keep up with her health. Needs motivation and encouragement to get out of bed and use the bedside commode. Relies on her mother and her brother to help take care of her. She states  she wears depends. Educated on risks vs benefits and patient is aware of what to look out for if she develops a UTI.    Plan: 1) Medication changes recommended at this time: - Monitor creatinine with daily metolazone  2) Patient assistance: - None pending, has [place] Medicaid - Possible CIR / SNF at discharge  3)  Education  - Patient has been educated on current HF medications and potential additions to HF medication regimen - Patient verbalizes understanding that over the next few months, these medication doses may change and more medications may be added to optimize HF regimen - Patient has been educated on basic disease state pathophysiology and goals of therapy   Sharen Hones, PharmD, BCPS Heart Failure Stewardship Pharmacist Phone [phone]

## 2023-01-18 ENCOUNTER — Other Ambulatory Visit (HOSPITAL_COMMUNITY): Payer: Self-pay

## 2023-01-18 DIAGNOSIS — R601 Generalized edema: Secondary | ICD-10-CM | POA: Diagnosis not present

## 2023-01-18 LAB — BASIC METABOLIC PANEL
Anion gap: 12 (ref 5–15)
BUN: 24 mg/dL — ABNORMAL HIGH (ref 6–20)
CO2: 30 mmol/L (ref 22–32)
Calcium: 9.9 mg/dL (ref 8.9–10.3)
Chloride: 91 mmol/L — ABNORMAL LOW (ref 98–111)
Creatinine, Ser: 0.8 mg/dL (ref 0.44–1.00)
GFR, Estimated: >60 mL/min (ref >60)
Glucose, Bld: 108 mg/dL — ABNORMAL HIGH (ref 70–99)
Potassium: 3.8 mmol/L (ref 3.5–5.1)
Sodium: 133 mmol/L — ABNORMAL LOW (ref 135–145)

## 2023-01-18 LAB — GLUCOSE, CAPILLARY
Glucose-Capillary: 101 mg/dL — ABNORMAL HIGH (ref 70–99)
Glucose-Capillary: 156 mg/dL — ABNORMAL HIGH (ref 70–99)

## 2023-01-18 MED ORDER — POTASSIUM CHLORIDE CRYS ER 20 MEQ PO TBCR
40.0000 meq | EXTENDED_RELEASE_TABLET | Freq: Two times a day (BID) | ORAL | 0 refills | Status: DC
  Filled 2023-01-18: qty 40, 10d supply, fill #0

## 2023-01-18 MED ORDER — TORSEMIDE 20 MG PO TABS
40.0000 mg | ORAL_TABLET | Freq: Every day | ORAL | 0 refills | Status: DC
  Filled 2023-01-18: qty 60, 30d supply, fill #0

## 2023-01-18 MED ORDER — OXYCODONE HCL 5 MG PO TABS: 5.0000 mg | ORAL_TABLET | Freq: Four times a day (QID) | ORAL | 0 refills | Status: AC | PRN

## 2023-01-18 MED ORDER — GABAPENTIN 400 MG PO CAPS
400.0000 mg | ORAL_CAPSULE | Freq: Three times a day (TID) | ORAL | 1 refills | Status: DC
  Filled 2023-01-18: qty 90, 30d supply, fill #0

## 2023-01-18 MED ORDER — HYDROXYZINE HCL 25 MG PO TABS
25.0000 mg | ORAL_TABLET | Freq: Every evening | ORAL | 0 refills | Status: DC | PRN
  Filled 2023-01-18: qty 30, 30d supply, fill #0

## 2023-01-18 MED ORDER — SPIRONOLACTONE 25 MG PO TABS
25.0000 mg | ORAL_TABLET | Freq: Every day | ORAL | 0 refills | Status: DC
  Filled 2023-01-18: qty 30, 30d supply, fill #0

## 2023-01-18 MED ORDER — DIAZEPAM 2 MG PO TABS: 2.0000 mg | ORAL_TABLET | Freq: Three times a day (TID) | ORAL | 0 refills | Status: DC | PRN

## 2023-01-18 NOTE — Progress Notes (Signed)
This RN attempted to call report to the phone number provided for [facility]. No answer. RN left HIPAA compliant voicemail with call back number provided.

## 2023-01-18 NOTE — Plan of Care (Signed)

## 2023-01-18 NOTE — TOC Transition Note (Signed)
Transition of Care [facility]) - CM/SW Discharge Note   Patient Details  Name: [name] MRN: [id number] Date of Birth: [birth year]  [facility]) CM/SW Contact:  Deatra Robinson, [place] Phone Number: 01/18/2023, 12:53 PM   Clinical Narrative: pt for dc to [facility] today. Spoke to [facility] in admissions who confirmed they are prepared to admit pt to room 208. Pt aware of dc and reports agreeable. RN provided with number for report and PTAR arranged for transport. SW signing off at dc.   Dellie Burns, MSW, LCSW [phone] (coverage)        Final next level of care: Skilled Nursing Facility Barriers to Discharge: No Barriers Identified   Patient Goals and CMS Choice CMS Medicare.gov Compare Post Acute Care list provided to:: Patient Choice offered to / list presented to : Patient  Discharge Placement                Patient chooses bed at: [facility] Patient to be transferred to facility by: PTAR Name of family member notified: Pt to update family Patient and family notified of of transfer: 01/18/23  Discharge Plan and Services Additional resources added to the After Visit Summary for   In-house Referral: Clinical Social Work                                   Social Determinants of Health (SDOH) Interventions SDOH Screenings   Food Insecurity: No Food Insecurity (01/07/2023)  Housing: Patient Declined (01/16/2023)  Transportation Needs: No Transportation Needs (01/16/2023)  Utilities: Not At Risk (01/07/2023)  Alcohol Screen: Low Risk  (01/16/2023)  Depression (PHQ2-9): Low Risk  (09/05/2022)  Financial Resource Strain: Low Risk  (01/16/2023)  Physical Activity: Insufficiently Active (09/02/2022)  Social Connections: Moderately Isolated (09/02/2022)  Stress: No Stress Concern Present (09/02/2022)  Tobacco Use: Medium Risk (01/06/2023)     Readmission Risk Interventions    11/28/2022   10:29 AM  Readmission Risk Prevention  Plan  Post Dischage Appt Complete  Medication Screening Complete  Transportation Screening Complete

## 2023-01-18 NOTE — Discharge Summary (Addendum)
Physician Discharge Summary  Patient ID: [name] MRN: [id number] DOB/AGE: February 24, 1971 52 y.o.  Admit date: 01/06/2023 Discharge date: 01/18/2023  Admission Diagnoses:  Discharge Diagnoses:  Principal Problem:   Acute on chronic diastolic CHF   Anasarca Active Problems:   S/P insertion of IVC (inferior vena caval) filter   Chronic anemia   Essential hypertension   Lymphedema   History of DVT (deep vein thrombosis)   Morbid obesity with BMI of 50.0-59.9, adult (HCC)   Depression   OSA (obstructive sleep apnea)   Hyperlipidemia with target LDL less than 100   Type 2 diabetes mellitus with morbid obesity (HCC)   Lack of motivation   Peripheral edema   Chronic diastolic CHF (congestive heart failure) (HCC)   Discharged Condition: stable  Hospital Course: Patient is a 52 year old female, morbidly obese, past medical history significant for chronic diastolic CHF, COPD, history of DVT on Eliquis, type 2 diabetes mellitus with diabetic polyneuropathy.  Patient was discharged from the hospital on 12/27/2022 after workup for dizziness.  Patient was admitted with acute exacerbation of diastolic CHF.  Patient was significantly volume overloaded on presentation (anasarca).  Patient is documented to have gained about 50 pounds in weight in less than a month.  Patient may not have been adequately diuresed prior to presentation.  Patient was admitted and managed with diuretics.  Patient has improved significantly.  Patient be discharged to skilled nursing facility.  Anasarca Acute on chronic diastolic CHF: -History of 50 pound weight gain after Lasix was held at discharge over a month ago. Last echo 11/20/2022 with EF of 55-60%, indeterminate diastolic parameters, RV is normal -Patient has been adequately diuresed. -Patient will be discharged on oral torsemide, Farxiga and Aldactone.  Lymphedema/severe leg edema: -Improved significantly.     Chronic pain  neuropathy -Continue  oxycodone, gabapentin,   Type 2 diabetes mellitus with morbid obesity (HCC): -A1c is 5.4.  -Continue subcutaneous Ozempic. -Monitor closely.     Hyperlipidemia with target LDL less than 100 Stable. On lipitor 40 mg daily.   OSA (obstructive sleep apnea) -Allegedly intolerant to CPAP   Depression Chronic. On paxil 20 mg daily.   Morbid obesity with BMI of 50.0-59.9, adult (HCC)- BMI 56.08.   History of DVT (deep vein thrombosis) On chronic Eliquis due to hx of DVT and IVC filter.   Essential hypertension Stable. Continue with lasix. Holding norvasc due to borderline BP.   Chronic anemia Stable. On oral iron.   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 On eliquis.   Insomnia- requests morphine at first for sleep aid but when I declined, she now asked for melatonin  - melatonin QHS    Consults: None   Discharge Exam: Blood pressure 104/65, pulse 85, temperature 98.1 F (36.7 C), temperature source Oral, resp. rate 18, height 5\' 3"  (1.6 m), weight 132.2 kg, SpO2 98%.   Disposition: Discharge disposition: 03-Skilled Nursing Facility       Discharge Instructions     Diet - low sodium heart healthy   Complete by: As directed    Diet Carb Modified   Complete by: As directed    Increase activity slowly   Complete by: As directed       Allergies as of 01/18/2023       Reactions   Ace Inhibitors Rash, Other (See Comments)   Make pt bleed   Aspirin Other (See Comments)   Per patient paperwork: blood clot?  Likely because of chronic DOAC  Hydromorphone Hives, Itching   Vancomycin Itching, Rash   Contrast Media [iodinated Contrast Media] Hives   Dilaudid [hydromorphone Hcl] Hives   Lidocaine Itching   Itching with Lidocaine patch reported 06/13/2021 vis telephone message.        Medication List     STOP taking these medications    acetaminophen 500 MG tablet Commonly known as: TYLENOL   amLODipine 5 MG tablet Commonly known as:  NORVASC   diclofenac Sodium 1 % Gel Commonly known as: Voltaren   furosemide 80 MG tablet Commonly known as: LASIX   hydrocortisone cream 1 %   meclizine 25 MG tablet Commonly known as: ANTIVERT       TAKE these medications    atorvastatin 40 MG tablet Commonly known as: LIPITOR Take 1 tablet (40 mg total) by mouth daily.   butalbital-acetaminophen-caffeine 50-325-40 MG tablet Commonly known as: FIORICET Take 1 tablet by mouth every 6 (six) hours as needed for headache.   cholecalciferol 25 MCG (1000 UNIT) tablet Commonly known as: VITAMIN D3 Take 1 tablet (1,000 Units total) by mouth daily.   cyanocobalamin 1000 MCG tablet Commonly known as: VITAMIN B12 Take 1,000 mcg by mouth daily.   diazepam 2 MG tablet Commonly known as: VALIUM Take 1 tablet (2 mg total) by mouth every 8 (eight) hours as needed (dizziness).   docusate sodium 100 MG capsule Commonly known as: COLACE Take 1 capsule (100 mg total) by mouth 2 (two) times daily as needed for mild constipation.   Dulera 200-5 MCG/ACT Aero Generic drug: mometasone-formoterol INHALE 2 PUFFS EVERY MORNING AND 2 PUFFS EVERY NIGHT AT BEDTIME What changed: See the new instructions.   Eliquis 5 MG Tabs tablet Generic drug: apixaban TAKE 1 TABLET BY MOUTH TWICE A DAY   Farxiga 10 MG Tabs tablet Generic drug: dapagliflozin propanediol TAKE 1 TABLET BY MOUTH DAILY   FeroSul 325 (65 Fe) MG tablet Generic drug: ferrous sulfate TAKE ONE TABLET BY MOUTH DAILY WITH BREAKFAST   gabapentin 400 MG capsule Commonly known as: NEURONTIN Take 1 capsule (400 mg total) by mouth 3 (three) times daily. What changed:  medication strength how much to take   hydrOXYzine 25 MG capsule Commonly known as: VISTARIL Take 1 capsule (25 mg total) by mouth at bedtime as needed. What changed: reasons to take this   hydrOXYzine 25 MG tablet Commonly known as: ATARAX Take 1 tablet (25 mg total) by mouth at bedtime as needed for  itching or anxiety.   nitroGLYCERIN 0.4 MG SL tablet Commonly known as: NITROSTAT Place 1 tablet (0.4 mg total) under the tongue every 5 (five) minutes as needed for chest pain.   oxyCODONE 5 MG immediate release tablet Commonly known as: Oxy IR/ROXICODONE Take 1 tablet (5 mg total) by mouth every 6 (six) hours as needed for up to 7 days for severe pain.   PARoxetine 20 MG tablet Commonly known as: PAXIL TAKE 1 TABLET BY MOUTH DAILY   potassium chloride SA 20 MEQ tablet Commonly known as: KLOR-CON M Take 2 tablets (40 mEq total) by mouth 2 (two) times daily for 10 days.   Semaglutide (1 MG/DOSE) 4 MG/3ML Sopn Inject 1 mg as directed once a week. What changed: additional instructions   spironolactone 25 MG tablet Commonly known as: ALDACTONE Take 1 tablet (25 mg total) by mouth daily. Start taking on: January 19, 2023   Torsemide 40 MG Tabs Take 40 mg by mouth daily.        Contact information for  follow-up providers     [facility]. Go in 18 day(s).   Specialty: Cardiology Why: Hospital follow up 02/03/2023 @ 2 pm PLEASE bring a current medication list to appointment FREE valet parking, Entrance C, off [employer] information: [address] [place] [address] [phone]             Contact information for after-discharge care     Destination     [facility] Preferred SNF .   Service: Skilled Nursing Contact information: [address] [place] [address] [phone]                     Time spent: 35 minutes.  SignedBarnetta Chapel 01/18/2023, 11:24 AM

## 2023-01-20 ENCOUNTER — Telehealth: Payer: Self-pay

## 2023-01-21 ENCOUNTER — Telehealth: Payer: Self-pay

## 2023-01-21 DIAGNOSIS — R600 Localized edema: Secondary | ICD-10-CM | POA: Diagnosis not present

## 2023-01-21 DIAGNOSIS — I890 Lymphedema, not elsewhere classified: Secondary | ICD-10-CM | POA: Diagnosis not present

## 2023-01-21 DIAGNOSIS — I2510 Atherosclerotic heart disease of native coronary artery without angina pectoris: Secondary | ICD-10-CM | POA: Diagnosis not present

## 2023-01-21 DIAGNOSIS — E119 Type 2 diabetes mellitus without complications: Secondary | ICD-10-CM | POA: Diagnosis not present

## 2023-01-21 DIAGNOSIS — F321 Major depressive disorder, single episode, moderate: Secondary | ICD-10-CM | POA: Diagnosis not present

## 2023-01-21 DIAGNOSIS — I10 Essential (primary) hypertension: Secondary | ICD-10-CM | POA: Diagnosis not present

## 2023-01-21 DIAGNOSIS — I5032 Chronic diastolic (congestive) heart failure: Secondary | ICD-10-CM | POA: Diagnosis not present

## 2023-01-21 DIAGNOSIS — J4541 Moderate persistent asthma with (acute) exacerbation: Secondary | ICD-10-CM | POA: Diagnosis not present

## 2023-01-21 NOTE — Transitions of Care (Post Inpatient/ED Visit) (Signed)
01/21/2023  Name: [name] MRN: [id number] DOB: 24-Oct-1970  Today's TOC FU Call Status: Today's TOC FU Call Status:: Unsuccessful Call (2nd Attempt) Unsuccessful Call (2nd Attempt) Date: 01/21/23  Attempted to reach the patient regarding the most recent Inpatient/ED visit.  Follow Up Plan: Additional outreach attempts will be made to reach the patient to complete the Transitions of Care (Post Inpatient/ED visit) call.   Alyse Low, RN, BA, [facility], CRRN [facility] Coordinator, Transition of Care Ph # [phone]

## 2023-01-22 ENCOUNTER — Telehealth: Payer: Self-pay

## 2023-01-22 DIAGNOSIS — F321 Major depressive disorder, single episode, moderate: Secondary | ICD-10-CM | POA: Diagnosis not present

## 2023-01-22 DIAGNOSIS — I5032 Chronic diastolic (congestive) heart failure: Secondary | ICD-10-CM | POA: Diagnosis not present

## 2023-01-22 DIAGNOSIS — E119 Type 2 diabetes mellitus without complications: Secondary | ICD-10-CM | POA: Diagnosis not present

## 2023-01-22 DIAGNOSIS — R600 Localized edema: Secondary | ICD-10-CM | POA: Diagnosis not present

## 2023-01-22 DIAGNOSIS — I2510 Atherosclerotic heart disease of native coronary artery without angina pectoris: Secondary | ICD-10-CM | POA: Diagnosis not present

## 2023-01-22 DIAGNOSIS — J4541 Moderate persistent asthma with (acute) exacerbation: Secondary | ICD-10-CM | POA: Diagnosis not present

## 2023-01-22 DIAGNOSIS — I890 Lymphedema, not elsewhere classified: Secondary | ICD-10-CM | POA: Diagnosis not present

## 2023-01-22 DIAGNOSIS — I10 Essential (primary) hypertension: Secondary | ICD-10-CM | POA: Diagnosis not present

## 2023-01-22 NOTE — Transitions of Care (Post Inpatient/ED Visit) (Signed)
01/22/2023  Name: [name] MRN: [id number] DOB: [birth year]  Today's TOC FU Call Status: Today's TOC FU Call Status:: Unsuccessful Call (3rd Attempt) Unsuccessful Call (3rd Attempt) Date: 01/22/23  Attempted to reach the patient regarding the most recent Inpatient/ED visit.  Follow Up Plan: No further outreach attempts will be made at this time. We have been unable to contact the patient.  Alyse Low, RN, BA, [facility], CRRN [facility] Coordinator, Transition of Care Ph # [phone]

## 2023-01-22 NOTE — Telephone Encounter (Addendum)
Late entry- attempt to return call Thursday September 19th while admitted in the hospital. Line busy that was given.    Attempt to call patient today,  Wednesday  September 25, Unable to reach.  Voicemail is full.

## 2023-01-25 DIAGNOSIS — F321 Major depressive disorder, single episode, moderate: Secondary | ICD-10-CM | POA: Diagnosis not present

## 2023-01-25 DIAGNOSIS — I10 Essential (primary) hypertension: Secondary | ICD-10-CM | POA: Diagnosis not present

## 2023-01-25 DIAGNOSIS — I890 Lymphedema, not elsewhere classified: Secondary | ICD-10-CM | POA: Diagnosis not present

## 2023-01-25 DIAGNOSIS — R278 Other lack of coordination: Secondary | ICD-10-CM | POA: Diagnosis not present

## 2023-01-25 DIAGNOSIS — M6281 Muscle weakness (generalized): Secondary | ICD-10-CM | POA: Diagnosis not present

## 2023-01-25 DIAGNOSIS — I2510 Atherosclerotic heart disease of native coronary artery without angina pectoris: Secondary | ICD-10-CM | POA: Diagnosis not present

## 2023-01-25 DIAGNOSIS — E119 Type 2 diabetes mellitus without complications: Secondary | ICD-10-CM | POA: Diagnosis not present

## 2023-01-25 DIAGNOSIS — R41841 Cognitive communication deficit: Secondary | ICD-10-CM | POA: Diagnosis not present

## 2023-01-25 DIAGNOSIS — I5032 Chronic diastolic (congestive) heart failure: Secondary | ICD-10-CM | POA: Diagnosis not present

## 2023-01-25 DIAGNOSIS — R600 Localized edema: Secondary | ICD-10-CM | POA: Diagnosis not present

## 2023-01-25 DIAGNOSIS — J4541 Moderate persistent asthma with (acute) exacerbation: Secondary | ICD-10-CM | POA: Diagnosis not present

## 2023-01-25 DIAGNOSIS — R262 Difficulty in walking, not elsewhere classified: Secondary | ICD-10-CM | POA: Diagnosis not present

## 2023-01-27 ENCOUNTER — Encounter: Payer: Self-pay | Admitting: Hematology

## 2023-01-27 DIAGNOSIS — E119 Type 2 diabetes mellitus without complications: Secondary | ICD-10-CM | POA: Diagnosis not present

## 2023-01-27 DIAGNOSIS — I2510 Atherosclerotic heart disease of native coronary artery without angina pectoris: Secondary | ICD-10-CM | POA: Diagnosis not present

## 2023-01-27 DIAGNOSIS — I890 Lymphedema, not elsewhere classified: Secondary | ICD-10-CM | POA: Diagnosis not present

## 2023-01-27 DIAGNOSIS — R600 Localized edema: Secondary | ICD-10-CM | POA: Diagnosis not present

## 2023-01-27 DIAGNOSIS — I5032 Chronic diastolic (congestive) heart failure: Secondary | ICD-10-CM | POA: Diagnosis not present

## 2023-01-27 DIAGNOSIS — F321 Major depressive disorder, single episode, moderate: Secondary | ICD-10-CM | POA: Diagnosis not present

## 2023-01-27 DIAGNOSIS — J4541 Moderate persistent asthma with (acute) exacerbation: Secondary | ICD-10-CM | POA: Diagnosis not present

## 2023-01-27 DIAGNOSIS — I10 Essential (primary) hypertension: Secondary | ICD-10-CM | POA: Diagnosis not present

## 2023-01-31 DIAGNOSIS — R600 Localized edema: Secondary | ICD-10-CM | POA: Diagnosis not present

## 2023-01-31 DIAGNOSIS — F321 Major depressive disorder, single episode, moderate: Secondary | ICD-10-CM | POA: Diagnosis not present

## 2023-01-31 DIAGNOSIS — I10 Essential (primary) hypertension: Secondary | ICD-10-CM | POA: Diagnosis not present

## 2023-01-31 DIAGNOSIS — I890 Lymphedema, not elsewhere classified: Secondary | ICD-10-CM | POA: Diagnosis not present

## 2023-01-31 DIAGNOSIS — J4541 Moderate persistent asthma with (acute) exacerbation: Secondary | ICD-10-CM | POA: Diagnosis not present

## 2023-01-31 DIAGNOSIS — I5032 Chronic diastolic (congestive) heart failure: Secondary | ICD-10-CM | POA: Diagnosis not present

## 2023-01-31 DIAGNOSIS — E119 Type 2 diabetes mellitus without complications: Secondary | ICD-10-CM | POA: Diagnosis not present

## 2023-01-31 DIAGNOSIS — I2510 Atherosclerotic heart disease of native coronary artery without angina pectoris: Secondary | ICD-10-CM | POA: Diagnosis not present

## 2023-02-03 ENCOUNTER — Ambulatory Visit (HOSPITAL_COMMUNITY)
Admit: 2023-02-03 | Discharge: 2023-02-03 | Disposition: A | Discharge status: Home / Self Care | Payer: Managed Care, Other (non HMO) | Attending: Physician Assistant | Admitting: Physician Assistant

## 2023-02-03 ENCOUNTER — Encounter (HOSPITAL_COMMUNITY): Payer: Self-pay

## 2023-02-03 VITALS — BP 118/74 | HR 93 | Wt 293.2 lb

## 2023-02-03 DIAGNOSIS — Z79899 Other long term (current) drug therapy: Secondary | ICD-10-CM | POA: Diagnosis not present

## 2023-02-03 DIAGNOSIS — Z7985 Long-term (current) use of injectable non-insulin antidiabetic drugs: Secondary | ICD-10-CM | POA: Insufficient documentation

## 2023-02-03 DIAGNOSIS — Z7984 Long term (current) use of oral hypoglycemic drugs: Secondary | ICD-10-CM | POA: Insufficient documentation

## 2023-02-03 DIAGNOSIS — G4733 Obstructive sleep apnea (adult) (pediatric): Secondary | ICD-10-CM

## 2023-02-03 DIAGNOSIS — Z6841 Body Mass Index (BMI) 40.0 and over, adult: Secondary | ICD-10-CM | POA: Insufficient documentation

## 2023-02-03 DIAGNOSIS — E119 Type 2 diabetes mellitus without complications: Secondary | ICD-10-CM | POA: Diagnosis not present

## 2023-02-03 DIAGNOSIS — I10 Essential (primary) hypertension: Secondary | ICD-10-CM

## 2023-02-03 DIAGNOSIS — I110 Hypertensive heart disease with heart failure: Secondary | ICD-10-CM | POA: Diagnosis not present

## 2023-02-03 DIAGNOSIS — D509 Iron deficiency anemia, unspecified: Secondary | ICD-10-CM | POA: Diagnosis not present

## 2023-02-03 DIAGNOSIS — Z7901 Long term (current) use of anticoagulants: Secondary | ICD-10-CM | POA: Diagnosis not present

## 2023-02-03 DIAGNOSIS — J4489 Other specified chronic obstructive pulmonary disease: Secondary | ICD-10-CM | POA: Insufficient documentation

## 2023-02-03 DIAGNOSIS — I5032 Chronic diastolic (congestive) heart failure: Secondary | ICD-10-CM | POA: Diagnosis present

## 2023-02-03 DIAGNOSIS — Z86718 Personal history of other venous thrombosis and embolism: Secondary | ICD-10-CM | POA: Insufficient documentation

## 2023-02-03 LAB — COMPREHENSIVE METABOLIC PANEL
ALT: 12 U/L (ref 0–44)
AST: 16 U/L (ref 15–41)
Albumin: 3.3 g/dL — ABNORMAL LOW (ref 3.5–5.0)
Alkaline Phosphatase: 75 U/L (ref 38–126)
Anion gap: 11 (ref 5–15)
BUN: 12 mg/dL (ref 6–20)
CO2: 31 mmol/L (ref 22–32)
Calcium: 9.3 mg/dL (ref 8.9–10.3)
Chloride: 94 mmol/L — ABNORMAL LOW (ref 98–111)
Creatinine, Ser: 0.98 mg/dL (ref 0.44–1.00)
GFR, Estimated: >60 mL/min (ref >60)
Glucose, Bld: 108 mg/dL — ABNORMAL HIGH (ref 70–99)
Potassium: 3.1 mmol/L — ABNORMAL LOW (ref 3.5–5.1)
Sodium: 136 mmol/L (ref 135–145)
Total Bilirubin: 1 mg/dL (ref 0.3–1.2)
Total Protein: 7.7 g/dL (ref 6.5–8.1)

## 2023-02-03 LAB — BRAIN NATRIURETIC PEPTIDE: B Natriuretic Peptide: 27.8 pg/mL (ref 0.0–100.0)

## 2023-02-03 NOTE — Progress Notes (Signed)
HEART & VASCULAR TRANSITION OF CARE CONSULT NOTE     Referring Physician: Dr. Dartha Lodge Primary Care: Dr. Laural Benes Primary Cardiologist: Dr. Herbie Baltimore  HPI: Referred to clinic by Dr. Dartha Lodge with [facility] for heart failure consultation. 52 y.o. female with history of DVT, OSA, DM II, asthma/COPD overlap, morbid obesity, HTN, iron deficiency anemia, HFpEF, lymphedema, 45 pack year smoking history (quit in 2005).     Admitted with dizziness and vertigo in July 2024. She was discharged to SNF for rehab. Home lasix was held. She returned home after discharge from SNF but subsequently went to [facility] ED a few days later on 01/07/23 for evaluation of worsening lower extremity edema and 50 lb weight gain. Admitted for acute on chronic diastolic CHF. Echo with EF 60-65%, left ventricular diastolic parameters indeterminate, RV okay. She was diuresed over 40 lb and discharged to SNF on combination of Torsemide 40 mg daily, spiro 25 mg daily and Farxiga 10 mg daily.   Here today for hospital follow-up. Remains at rehab facility. She reports that her strength is slowly improving. She reports she has been able to ambulate with a walker. Reports occasional shortness of breath, states it just comes and goes. Lower extremity edema has improved. Weight at facility was 293 lb today, she was 291 lb at discharge from hospital. She does not use a salt shaker and reports the food at SNF is bland and does not taste as if there is added sodium. Her CPAP is at home, has not been using while at [facility].     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   Morbid obesity due to excess calories (HCC)    OSA (obstructive sleep apnea) 07/11/2021   Sleep study April 06, 2019 (Dr. Craige Cotta): Moderate OSA (AHI of 24.4 and SPO2 low of 77%).  Majority events during REM sleep.-recommend CPAP, oral appliance or surgical. Reviewed sleep hygiene.  Avoid sedatives.     Osteoarthritis of left knee    Prediabetes    Small bowel obstruction (HCC)    as a child   Speech impediment    Stutter / stammer    Current Outpatient Medications  Medication Sig Dispense Refill   atorvastatin (LIPITOR) 40 MG tablet Take 1 tablet (40 mg total) by mouth daily. 90 tablet 1   butalbital-acetaminophen-caffeine (FIORICET) 50-325-40 MG tablet Take 1 tablet by mouth every 6 (six) hours as needed for headache. 14 tablet 0   cholecalciferol (VITAMIN D3) 25 MCG (1000 UNIT) tablet Take 1 tablet (1,000 Units total) by mouth daily. 30  tablet 5   diazepam (VALIUM) 2 MG tablet Take 1 tablet (2 mg total) by mouth every 8 (eight) hours as needed (dizziness). 15 tablet 0   docusate sodium (COLACE) 100 MG capsule Take 1 capsule (100 mg total) by mouth 2 (two) times daily as needed for mild constipation.     DULERA 200-5 MCG/ACT AERO INHALE 2 PUFFS EVERY MORNING AND 2 PUFFS EVERY NIGHT AT BEDTIME (Patient taking differently: Inhale 2 puffs into the lungs See admin instructions. INHALE 2 PUFFS EVERY MORNING AND 2 PUFFS EVERY NIGHT AT BEDTIME) 13 g 3   ELIQUIS 5 MG  TABS tablet TAKE 1 TABLET BY MOUTH TWICE A DAY 180 tablet 1   FARXIGA 10 MG TABS tablet TAKE 1 TABLET BY MOUTH DAILY 90 tablet 0   FEROSUL 325 (65 Fe) MG tablet TAKE ONE TABLET BY MOUTH DAILY WITH BREAKFAST 100 tablet 0   gabapentin (NEURONTIN) 400 MG capsule Take 1 capsule (400 mg total) by mouth 3 (three) times daily. 90 capsule 1   hydrOXYzine (ATARAX) 25 MG tablet Take 1 tablet (25 mg total) by mouth at bedtime as needed for itching or anxiety. 30 tablet 0   nitroGLYCERIN (NITROSTAT) 0.4 MG SL tablet Place 1 tablet (0.4 mg total) under the tongue every 5 (five) minutes as needed for chest pain. 30 tablet 0   oxyCODONE (OXY IR/ROXICODONE) 5 MG immediate release tablet Take 5 mg by mouth 4 (four) times daily as needed.     PARoxetine (PAXIL) 20 MG tablet TAKE 1 TABLET BY MOUTH DAILY 30 tablet 1   Semaglutide, 1 MG/DOSE, 4 MG/3ML SOPN Inject 1 mg as directed once a week. (Patient taking differently: Inject 1 mg as directed once a week. Patient takes on Wednesdays) 3 mL 4   spironolactone (ALDACTONE) 25 MG tablet Take 1 tablet (25 mg total) by mouth daily. 30 tablet 0   torsemide (DEMADEX) 20 MG tablet Take 2 tablets (40 mg total) by mouth daily. 60 tablet 0   vitamin B-12 (CYANOCOBALAMIN) 1000 MCG tablet Take 1,000 mcg by mouth daily.     potassium chloride SA (KLOR-CON M) 20 MEQ tablet Take 2 tablets (40 mEq total) by mouth 2 (two) times daily for 10 days. 40 tablet 0   No current facility-administered medications for this encounter.    Allergies  Allergen Reactions   Ace Inhibitors Rash and Other (See Comments)    Make pt bleed   Aspirin Other (See Comments)    Per patient paperwork: blood clot?  Likely because of chronic DOAC   Hydromorphone Hives and Itching   Vancomycin Itching and Rash   Contrast Media [Iodinated Contrast Media] Hives   Dilaudid [Hydromorphone Hcl] Hives   Lidocaine Itching    Itching with Lidocaine patch reported 06/13/2021 vis telephone message.      Social  History   Socioeconomic History   Marital status: Single    Spouse name: Not on file   Number of children: 0   Years of education: Not on file   Highest education level: Some college, no degree  Occupational History   Occupation: unemployed on disablity  Tobacco Use   Smoking status: Former    Current packs/day: 0.00    Average packs/day: 0.3 packs/day for 18.0 years (4.5 ttl pk-yrs)    Types: Cigarettes    Start date: 04/29/1984    Quit date: 04/29/2002    Years since quitting: 20.7   Smokeless tobacco: Never  Vaping Use   Vaping status: Never Used  Substance and Sexual Activity   Alcohol use: Not Currently   Drug use: Not Currently   Sexual activity: Not Currently  Other Topics Concern   Not on file  Social History Narrative   Not on file   Social Determinants of Health   Financial Resource Strain: Low Risk  (01/16/2023)   Overall Financial Resource Strain (CARDIA)    Difficulty of Paying Living Expenses: Not hard at all  Food Insecurity: No Food Insecurity (01/07/2023)   Hunger Vital Sign    Worried About Running Out of Food in the Last Year: Never true    Ran Out of Food in the Last Year: Never true  Transportation Needs: No Transportation Needs (01/16/2023)   PRAPARE - Administrator, Civil Service (Medical): No    Lack of Transportation (Non-Medical): No  Physical Activity: Insufficiently Active (09/02/2022)   Exercise Vital Sign    Days of Exercise per Week: 3 days    Minutes of Exercise per Session: 20 min  Stress: No Stress Concern Present (09/02/2022)   [employer] of Occupational Health - Occupational Stress Questionnaire    Feeling of Stress : Only a little  Social Connections: Moderately Isolated (09/02/2022)   Social Connection and Isolation Panel [NHANES]    Frequency of Communication with Friends and Family: More than three times a week    Frequency of Social Gatherings with Friends and Family: Twice a week    Attends Religious Services: More  than 4 times per year    Active Member of [employer] or Organizations: No    Attends Engineer, structural: Not on file    Marital Status: Never married  Intimate Partner Violence: Not At Risk (01/07/2023)   Humiliation, Afraid, Rape, and Kick questionnaire    Fear of Current or Ex-Partner: No    Emotionally Abused: No    Physically Abused: No    Sexually Abused: No      Family History  Problem Relation Age of Onset   Diabetes Mellitus II Mother    COPD Father    Diabetes Father    Colon cancer Father    Diabetes Mellitus II Maternal Grandmother    Breast cancer Paternal Grandfather    Liver disease Neg Hx    Esophageal cancer Neg Hx     Vitals:   02/03/23 1427  BP: 118/74  Pulse: 93  SpO2: 97%  Weight: 133 kg (293 lb 3.2 oz)    PHYSICAL EXAM: General:  Chronically ill appearing. No distress. Arrived in wheelchair. HEENT: normal Neck: supple. Thick neck. Cor: PMI nondisplaced. Regular rate & rhythm. No rubs, gallops or murmurs. Lungs: diminished Abdomen: obese, soft, nontender, nondistended.  Extremities: no cyanosis, clubbing, rash, trace edema Neuro: alert & oriented x 3. Affect pleasant.  ECG: SR 91 bpm   ASSESSMENT & PLAN: HFpEF -Echo 07/24: EF 60-65%, RV okay -In setting of morbid obesity, OSA/?OHS -Recent admission with volume overload after diuretics had been stopped. -Currently NYHA IIIb. Confounded by body habitus and deconditioning. -Volume looks good today. Continue 40 mg Torsemide daily -Continue Farxiga 10 mg daily, watch for GU symptoms -Continue Spiro 25 mg daily -Labs today  OSA Suspected OHS -Will resume CPAP once she returns home from rehab -Previously followed by Dr. Thora Lance with Pulmonary. Missed recent appointment with Dr. Francine Graven while at [facility]. Needs to schedule.   Morbid obesity -BMI ~ 52 -Significant weight loss is imperative -On ozempic  HTN -BP at goal -Continue above medications  Hx DVT -On  Eliquis   Referred to HFSW  (PCP, Medications, Transportation, ETOH Abuse, Drug Abuse, Insurance, Financial ): No Refer to Pharmacy: No Refer to Home Health: No Refer to Advanced Heart Failure Clinic: No  Refer to General Cardiology: Yes   Follow up  PRN, referred for follow-up with her Cardiologist, Dr. Herbie Baltimore.

## 2023-02-03 NOTE — Patient Instructions (Addendum)
Medication Changes:  No Changes In Medications at this time.   Lab Work:  Labs done today, your results will be available in MyChart, we will contact you for abnormal readings.   Follow-Up in: WITH GENERAL CARDIOLOGY- ON OCTOBER 29TH AT 2:20PM   At the [facility], you and your health needs are our priority. We have a designated team specialized in the treatment of Heart Failure. This Care Team includes your primary Heart Failure Specialized Cardiologist (physician), Advanced Practice Providers (APPs- Physician Assistants and Nurse Practitioners), and Pharmacist who all work together to provide you with the care you need, when you need it.   You may see any of the following providers on your designated Care Team at your next follow up:  Dr. Arvilla Meres Dr. Marca Ancona Dr. Dorthula Nettles Dr. Theresia Bough Tonye Becket, NP Robbie Lis, [place] [facility] [place], [place] Brynda Peon, NP [place] Lee, NP [name], PharmD   Please be sure to bring in all your medications bottles to every appointment.   Need to Contact us:  If you have any questions or concerns before your next appointment please send [place] a message through [place] or call our office at [phone].    TO LEAVE A MESSAGE FOR THE NURSE SELECT OPTION 2, PLEASE LEAVE A MESSAGE INCLUDING: YOUR NAME DATE OF BIRTH CALL BACK NUMBER REASON FOR CALL**this is important as we prioritize the call backs  YOU WILL RECEIVE A CALL BACK THE SAME DAY AS LONG AS YOU CALL BEFORE 4:00 PM

## 2023-02-04 ENCOUNTER — Telehealth (HOSPITAL_COMMUNITY): Payer: Self-pay

## 2023-02-04 DIAGNOSIS — R600 Localized edema: Secondary | ICD-10-CM | POA: Diagnosis not present

## 2023-02-04 DIAGNOSIS — E119 Type 2 diabetes mellitus without complications: Secondary | ICD-10-CM | POA: Diagnosis not present

## 2023-02-04 DIAGNOSIS — I10 Essential (primary) hypertension: Secondary | ICD-10-CM | POA: Diagnosis not present

## 2023-02-04 DIAGNOSIS — J4541 Moderate persistent asthma with (acute) exacerbation: Secondary | ICD-10-CM | POA: Diagnosis not present

## 2023-02-04 DIAGNOSIS — F321 Major depressive disorder, single episode, moderate: Secondary | ICD-10-CM | POA: Diagnosis not present

## 2023-02-04 DIAGNOSIS — I2510 Atherosclerotic heart disease of native coronary artery without angina pectoris: Secondary | ICD-10-CM | POA: Diagnosis not present

## 2023-02-04 DIAGNOSIS — I5032 Chronic diastolic (congestive) heart failure: Secondary | ICD-10-CM | POA: Diagnosis not present

## 2023-02-04 DIAGNOSIS — I890 Lymphedema, not elsewhere classified: Secondary | ICD-10-CM | POA: Diagnosis not present

## 2023-02-04 NOTE — Telephone Encounter (Addendum)
SNF called confirmed that patient still taking 40 mEq of potassium daily. Gave verbal order to increase to 60 mEq bid and repeat bmet on Friday. Orders have been faxed   ----- Message from [facility] [place] [facility] sent at 02/03/2023  5:02 PM EDT ----- Please confirm with SNF if patient is still taking potassium. Her med list at discharge stated to take for just 10 days? IF she is not taking, will need to start 40 mEq daily with BMET on Friday, if she is still taking dose in chart she needs to increase to 60 mEQ BID

## 2023-02-05 DIAGNOSIS — I2510 Atherosclerotic heart disease of native coronary artery without angina pectoris: Secondary | ICD-10-CM | POA: Diagnosis not present

## 2023-02-05 DIAGNOSIS — F321 Major depressive disorder, single episode, moderate: Secondary | ICD-10-CM | POA: Diagnosis not present

## 2023-02-05 DIAGNOSIS — R600 Localized edema: Secondary | ICD-10-CM | POA: Diagnosis not present

## 2023-02-05 DIAGNOSIS — E119 Type 2 diabetes mellitus without complications: Secondary | ICD-10-CM | POA: Diagnosis not present

## 2023-02-05 DIAGNOSIS — J4541 Moderate persistent asthma with (acute) exacerbation: Secondary | ICD-10-CM | POA: Diagnosis not present

## 2023-02-05 DIAGNOSIS — I5032 Chronic diastolic (congestive) heart failure: Secondary | ICD-10-CM | POA: Diagnosis not present

## 2023-02-05 DIAGNOSIS — I890 Lymphedema, not elsewhere classified: Secondary | ICD-10-CM | POA: Diagnosis not present

## 2023-02-05 DIAGNOSIS — I10 Essential (primary) hypertension: Secondary | ICD-10-CM | POA: Diagnosis not present

## 2023-02-06 ENCOUNTER — Telehealth (HOSPITAL_COMMUNITY): Payer: Self-pay | Admitting: Cardiology

## 2023-02-06 DIAGNOSIS — I10 Essential (primary) hypertension: Secondary | ICD-10-CM | POA: Diagnosis not present

## 2023-02-06 DIAGNOSIS — J4541 Moderate persistent asthma with (acute) exacerbation: Secondary | ICD-10-CM | POA: Diagnosis not present

## 2023-02-06 DIAGNOSIS — F321 Major depressive disorder, single episode, moderate: Secondary | ICD-10-CM | POA: Diagnosis not present

## 2023-02-06 DIAGNOSIS — E119 Type 2 diabetes mellitus without complications: Secondary | ICD-10-CM | POA: Diagnosis not present

## 2023-02-06 DIAGNOSIS — I2510 Atherosclerotic heart disease of native coronary artery without angina pectoris: Secondary | ICD-10-CM | POA: Diagnosis not present

## 2023-02-06 DIAGNOSIS — I5032 Chronic diastolic (congestive) heart failure: Secondary | ICD-10-CM

## 2023-02-06 DIAGNOSIS — R600 Localized edema: Secondary | ICD-10-CM | POA: Diagnosis not present

## 2023-02-06 DIAGNOSIS — I890 Lymphedema, not elsewhere classified: Secondary | ICD-10-CM | POA: Diagnosis not present

## 2023-02-06 MED ORDER — POTASSIUM CHLORIDE CRYS ER 20 MEQ PO TBCR: 20.0000 meq | EXTENDED_RELEASE_TABLET | Freq: Every day | ORAL | 2 refills | Status: DC

## 2023-02-06 NOTE — Telephone Encounter (Signed)
[name] with facility aware

## 2023-02-06 NOTE — Telephone Encounter (Signed)
Megan Sakai, NP with [facility] called with lab updates Reports pts last dose of KCL with 10/1 Labs repeated at facility K 3.5 done 10/9-value is without potassium   Please advise if the 40 BID is still needed and if so how soon before labs are to be repeated

## 2023-02-06 NOTE — Telephone Encounter (Signed)
Start potassium 20 mEq daily. Check BMET 1 week

## 2023-02-14 ENCOUNTER — Telehealth: Payer: Self-pay | Admitting: Internal Medicine

## 2023-02-14 ENCOUNTER — Other Ambulatory Visit: Payer: Self-pay

## 2023-02-14 DIAGNOSIS — E1169 Type 2 diabetes mellitus with other specified complication: Secondary | ICD-10-CM

## 2023-02-14 MED ORDER — SEMAGLUTIDE (1 MG/DOSE) 4 MG/3ML ~~LOC~~ SOPN: 1.0000 mg | PEN_INJECTOR | SUBCUTANEOUS | 4 refills | Status: DC

## 2023-02-14 NOTE — Telephone Encounter (Signed)
Spoke with patient. Patient was d/c from [facility] today. Reports that she was only given  one injection of Ozempic while at [facility] .  Voiced she was told it was due to the cost.  Patient voiced that she wanted to start back and asked for refill on Ozempic. Patient disconnect call before appointment could be made. Call placed to patient to schedule follow-up appointment unable to reach patient. Ozempic refill sent to pharmacy.

## 2023-02-14 NOTE — Telephone Encounter (Signed)
Patient said she is home now and need to know if she should take her weekly injection of the Ozempic or should she wait. Please f/u with patient

## 2023-02-25 ENCOUNTER — Encounter: Payer: Self-pay | Admitting: Cardiology

## 2023-02-25 ENCOUNTER — Ambulatory Visit: Payer: Managed Care, Other (non HMO) | Attending: Cardiology | Admitting: Cardiology

## 2023-02-25 VITALS — BP 118/84 | HR 92 | Ht 69.0 in | Wt 281.0 lb

## 2023-02-25 DIAGNOSIS — Z86718 Personal history of other venous thrombosis and embolism: Secondary | ICD-10-CM

## 2023-02-25 DIAGNOSIS — I5032 Chronic diastolic (congestive) heart failure: Secondary | ICD-10-CM

## 2023-02-25 DIAGNOSIS — G4733 Obstructive sleep apnea (adult) (pediatric): Secondary | ICD-10-CM | POA: Diagnosis not present

## 2023-02-25 DIAGNOSIS — I872 Venous insufficiency (chronic) (peripheral): Secondary | ICD-10-CM

## 2023-02-25 DIAGNOSIS — I10 Essential (primary) hypertension: Secondary | ICD-10-CM | POA: Diagnosis not present

## 2023-02-25 NOTE — Patient Instructions (Addendum)
Medication Instructions:  Stop Marcelline Deist *If you need a refill on your cardiac medications before your next appointment, please call your pharmacy*   Lab Work: Today: BMP If you have labs (blood work) drawn today and your tests are completely normal, you will receive your results only by: MyChart Message (if you have MyChart) OR A paper copy in the mail If you have any lab test that is abnormal or we need to change your treatment, we will call you to review the results.   Testing/Procedures: No testing   Follow-Up: At [facility], you and your health needs are our priority.  As part of our continuing mission to provide you with exceptional heart care, we have created designated Provider Care Teams.  These Care Teams include your primary Cardiologist (physician) and Advanced Practice Providers (APPs -  Physician Assistants and Nurse Practitioners) who all work together to provide you with the care you need, when you need it.  We recommend signing up for the patient portal called "MyChart".  Sign up information is provided on this After Visit Summary.  MyChart is used to connect with patients for Virtual Visits (Telemedicine).  Patients are able to view lab/test results, encounter notes, upcoming appointments, etc.  Non-urgent messages can be sent to your provider as well.   To learn more about what you can do with MyChart, go to [email or web address].    Your next appointment:   3 month(s)  Provider:   Any APP  Other Instructions Follow up with Dr. Laural Benes PCP

## 2023-02-25 NOTE — Progress Notes (Signed)
Cardiology Office Note:  .   Date:  02/25/2023  ID:  [name], DOB 1970/07/16, MRN [id number] PCP: Marcine Matar, MD  [facility] Providers Cardiologist:  Bryan Lemma, MD {  History of Present Illness: [name]   [name] is a 52 y.o. female with past medical history of DVT, OSA, type 2 diabetes, asthma/COPD, morbid obesity, hypertension, HFpEF.  Patient is followed by Dr. Herbie Baltimore and presents today for follow-up appointment.  Per chart review, patient previously underwent echocardiogram in 10/2020 that showed EF 55-60%, no regional wall motion abnormalities, mild LVH, grade 1 DD, normal RV function.  It was suspected that patient had hypertensive heart disease and was treated with Lasix, spironolactone, valsartan, amlodipine, carvedilol.  He was later started on Farxiga as well.  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  Also noted to have chronic venous insufficiency.  Per chart review, patient was admitted with dizziness, vertigo in 10/2022.  During that admission, echocardiogram on 11/27/2022 showed EF 60-65%, no regional wall motion abnormalities, normal RV function.  She was discharged to SNF for rehab, and Lasix was held.  She returned home after she was discharged from SNF, but was later seen at [facility] long ED in 12/2022 for evaluation of worsening lower extremity edema, 50 pound weight gain.  Admitted for acute on chronic HFpEF.  She was diuresed and lost over 40 pounds.  Was discharged to SNF on torsemide 40 mg daily, spironolactone 25 mg daily, Farxiga 10 mg daily.  Patient was seen by the heart and vascular transition of care on 02/03/2023.  At that time, patient remained at a rehab facility.  She had been able to ambulate with a walker.  Reported occasional shortness of breath, but had improvement in lower extremity edema.  Considered NYHA class IIIb.  She remained on torsemide 40 mg daily, Farxiga 10 mg  daily, spironolactone 25 mg daily.  She is concerned it was noted that patient had not been compliant with her CPAP because it was at home when she was in the rehab facility.  Overall, patient denies cardiac complaints today. She denies any swelling in the legs or difficulty breathing. Denies chest pain, palpitations, syncope, near syncope. She reports some generalized weakness, but this has been improving as she works with PT.  The patient is currently on Eliquis, torsemide, spironolactone, and Farxiga for heart failure management. She reports no issues with bleeding or bruising. The patient was previously on Carvedilol but has been taken off this medication due to dizziness and low BP in the past. The patient's blood pressure is currently well-controlled. The patient also reports a history of low potassium levels and is on a supplement for this. Since leaving rehab, she has been compliant with CPAP therapy  Patient's main complaint is ear pain. She reports that the ear pain is severe and was associated with bloody discharge the previous day. The patient's mother had cleaned the ear with peroxide and a Q-tip. The patient also reports itching in the genital area, suspecting a possible yeast infection. She has been using an antifungal cream twice daily. The patient denies dysuria or abnormal discharge. The patient also mentions that she has been taking melatonin and Valium for sleep issues. I encouraged patient to follow up with her primary care provider for ear pain and genital itching.   ROS: Per HPI   Studies Reviewed: .   Cardiac Studies & Procedures  ECHOCARDIOGRAM  ECHOCARDIOGRAM COMPLETE 11/27/2022  Narrative ECHOCARDIOGRAM REPORT    Patient Name:   [name] Date of Exam: 11/27/2022 Medical Rec #:  [id number]               Height:       63.0 in Accession #:    [id number]              Weight:       278.0 lb Date of Birth:  08/28/1970                BSA:          2.224  m Patient Age:    52 years                BP:           87/64 mmHg Patient Gender: F                       HR:           104 bpm. Exam Location:  Inpatient  Procedure: 2D Echo, Cardiac Doppler, Color Doppler and Intracardiac Opacification Agent  Indications:    R55 Syncope  History:        Patient has prior history of Echocardiogram examinations, most recent 05/26/2021. CAD, COPD; Risk Factors:Diabetes and Hypertension. HFpEF.  Sonographer:    Harriette Bouillon RDCS Referring Phys: [phone] ANKIT CHIRAG AMIN  IMPRESSIONS   1. Left ventricular ejection fraction, by estimation, is 60 to 65%. The left ventricle has normal function. The left ventricle has no regional wall motion abnormalities. Left ventricular diastolic parameters are indeterminate. 2. Right ventricular systolic function is normal. The right ventricular size is normal. 3. The mitral valve is normal in structure. No evidence of mitral valve regurgitation. No evidence of mitral stenosis. 4. The aortic valve is normal in structure. Aortic valve regurgitation is not visualized. No aortic stenosis is present. 5. The inferior vena cava is normal in size with greater than 50% respiratory variability, suggesting right atrial pressure of 3 mmHg.  Comparison(s): Prior images reviewed side by side.  FINDINGS Left Ventricle: Left ventricular ejection fraction, by estimation, is 60 to 65%. The left ventricle has normal function. The left ventricle has no regional wall motion abnormalities. The left ventricular internal cavity size was normal in size. There is no left ventricular hypertrophy. Left ventricular diastolic parameters are indeterminate.  Right Ventricle: The right ventricular size is normal. No increase in right ventricular wall thickness. Right ventricular systolic function is normal.  Left Atrium: Left atrial size was normal in size.  Right Atrium: Right atrial size was normal in size.  Pericardium: There is no evidence  of pericardial effusion.  Mitral Valve: The mitral valve is normal in structure. No evidence of mitral valve regurgitation. No evidence of mitral valve stenosis.  Tricuspid Valve: The tricuspid valve is normal in structure. Tricuspid valve regurgitation is not demonstrated. No evidence of tricuspid stenosis.  Aortic Valve: The aortic valve is normal in structure. Aortic valve regurgitation is not visualized. No aortic stenosis is present.  Pulmonic Valve: The pulmonic valve was normal in structure. Pulmonic valve regurgitation is not visualized. No evidence of pulmonic stenosis.  Aorta: The aortic root is normal in size and structure.  Venous: The inferior vena cava is normal in size with greater than 50% respiratory variability, suggesting right atrial pressure of 3 mmHg.  IAS/Shunts: No atrial level shunt detected by color flow Doppler.  Cardiology Office Note:  .   Date:  02/25/2023  ID:  [name], DOB 1970/07/16, MRN [id number] PCP: Marcine Matar, MD  [facility] Providers Cardiologist:  Bryan Lemma, MD {  History of Present Illness: [name]   [name] is a 52 y.o. female with past medical history of DVT, OSA, type 2 diabetes, asthma/COPD, morbid obesity, hypertension, HFpEF.  Patient is followed by Dr. Herbie Baltimore and presents today for follow-up appointment.  Per chart review, patient previously underwent echocardiogram in 10/2020 that showed EF 55-60%, no regional wall motion abnormalities, mild LVH, grade 1 DD, normal RV function.  It was suspected that patient had hypertensive heart disease and was treated with Lasix, spironolactone, valsartan, amlodipine, carvedilol.  He was later started on Farxiga as well.  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  Also noted to have chronic venous insufficiency.  Per chart review, patient was admitted with dizziness, vertigo in 10/2022.  During that admission, echocardiogram on 11/27/2022 showed EF 60-65%, no regional wall motion abnormalities, normal RV function.  She was discharged to SNF for rehab, and Lasix was held.  She returned home after she was discharged from SNF, but was later seen at [facility] long ED in 12/2022 for evaluation of worsening lower extremity edema, 50 pound weight gain.  Admitted for acute on chronic HFpEF.  She was diuresed and lost over 40 pounds.  Was discharged to SNF on torsemide 40 mg daily, spironolactone 25 mg daily, Farxiga 10 mg daily.  Patient was seen by the heart and vascular transition of care on 02/03/2023.  At that time, patient remained at a rehab facility.  She had been able to ambulate with a walker.  Reported occasional shortness of breath, but had improvement in lower extremity edema.  Considered NYHA class IIIb.  She remained on torsemide 40 mg daily, Farxiga 10 mg  daily, spironolactone 25 mg daily.  She is concerned it was noted that patient had not been compliant with her CPAP because it was at home when she was in the rehab facility.  Overall, patient denies cardiac complaints today. She denies any swelling in the legs or difficulty breathing. Denies chest pain, palpitations, syncope, near syncope. She reports some generalized weakness, but this has been improving as she works with PT.  The patient is currently on Eliquis, torsemide, spironolactone, and Farxiga for heart failure management. She reports no issues with bleeding or bruising. The patient was previously on Carvedilol but has been taken off this medication due to dizziness and low BP in the past. The patient's blood pressure is currently well-controlled. The patient also reports a history of low potassium levels and is on a supplement for this. Since leaving rehab, she has been compliant with CPAP therapy  Patient's main complaint is ear pain. She reports that the ear pain is severe and was associated with bloody discharge the previous day. The patient's mother had cleaned the ear with peroxide and a Q-tip. The patient also reports itching in the genital area, suspecting a possible yeast infection. She has been using an antifungal cream twice daily. The patient denies dysuria or abnormal discharge. The patient also mentions that she has been taking melatonin and Valium for sleep issues. I encouraged patient to follow up with her primary care provider for ear pain and genital itching.   ROS: Per HPI   Studies Reviewed: .   Cardiac Studies & Procedures  ECHOCARDIOGRAM  ECHOCARDIOGRAM COMPLETE 11/27/2022  Narrative ECHOCARDIOGRAM REPORT    Patient Name:   [name] Date of Exam: 11/27/2022 Medical Rec #:  [id number]               Height:       63.0 in Accession #:    [id number]              Weight:       278.0 lb Date of Birth:  08/28/1970                BSA:          2.224  m Patient Age:    52 years                BP:           87/64 mmHg Patient Gender: F                       HR:           104 bpm. Exam Location:  Inpatient  Procedure: 2D Echo, Cardiac Doppler, Color Doppler and Intracardiac Opacification Agent  Indications:    R55 Syncope  History:        Patient has prior history of Echocardiogram examinations, most recent 05/26/2021. CAD, COPD; Risk Factors:Diabetes and Hypertension. HFpEF.  Sonographer:    Harriette Bouillon RDCS Referring Phys: [phone] ANKIT CHIRAG AMIN  IMPRESSIONS   1. Left ventricular ejection fraction, by estimation, is 60 to 65%. The left ventricle has normal function. The left ventricle has no regional wall motion abnormalities. Left ventricular diastolic parameters are indeterminate. 2. Right ventricular systolic function is normal. The right ventricular size is normal. 3. The mitral valve is normal in structure. No evidence of mitral valve regurgitation. No evidence of mitral stenosis. 4. The aortic valve is normal in structure. Aortic valve regurgitation is not visualized. No aortic stenosis is present. 5. The inferior vena cava is normal in size with greater than 50% respiratory variability, suggesting right atrial pressure of 3 mmHg.  Comparison(s): Prior images reviewed side by side.  FINDINGS Left Ventricle: Left ventricular ejection fraction, by estimation, is 60 to 65%. The left ventricle has normal function. The left ventricle has no regional wall motion abnormalities. The left ventricular internal cavity size was normal in size. There is no left ventricular hypertrophy. Left ventricular diastolic parameters are indeterminate.  Right Ventricle: The right ventricular size is normal. No increase in right ventricular wall thickness. Right ventricular systolic function is normal.  Left Atrium: Left atrial size was normal in size.  Right Atrium: Right atrial size was normal in size.  Pericardium: There is no evidence  of pericardial effusion.  Mitral Valve: The mitral valve is normal in structure. No evidence of mitral valve regurgitation. No evidence of mitral valve stenosis.  Tricuspid Valve: The tricuspid valve is normal in structure. Tricuspid valve regurgitation is not demonstrated. No evidence of tricuspid stenosis.  Aortic Valve: The aortic valve is normal in structure. Aortic valve regurgitation is not visualized. No aortic stenosis is present.  Pulmonic Valve: The pulmonic valve was normal in structure. Pulmonic valve regurgitation is not visualized. No evidence of pulmonic stenosis.  Aorta: The aortic root is normal in size and structure.  Venous: The inferior vena cava is normal in size with greater than 50% respiratory variability, suggesting right atrial pressure of 3 mmHg.  IAS/Shunts: No atrial level shunt detected by color flow Doppler.

## 2023-02-28 ENCOUNTER — Telehealth: Payer: Self-pay

## 2023-02-28 NOTE — Telephone Encounter (Unsigned)
Copied from CRM [id number]. Topic: General - Inquiry >> Feb 28, 2023  3:02 PM Marlow Baars wrote: Reason for CRM: Aisha with [facility] called in stating she was faxing over requests to the provider for several medications. This is a new pharmacy for the patient. She states she previously sent a request but is re faxing. Please assist further

## 2023-03-06 ENCOUNTER — Encounter: Payer: Self-pay | Admitting: Internal Medicine

## 2023-03-06 ENCOUNTER — Ambulatory Visit: Payer: Managed Care, Other (non HMO) | Attending: Internal Medicine | Admitting: Internal Medicine

## 2023-03-06 VITALS — BP 110/79 | HR 82 | Temp 97.6°F | Ht 69.0 in | Wt 287.0 lb

## 2023-03-06 DIAGNOSIS — E1169 Type 2 diabetes mellitus with other specified complication: Secondary | ICD-10-CM

## 2023-03-06 DIAGNOSIS — I2510 Atherosclerotic heart disease of native coronary artery without angina pectoris: Secondary | ICD-10-CM

## 2023-03-06 DIAGNOSIS — Z23 Encounter for immunization: Secondary | ICD-10-CM

## 2023-03-06 DIAGNOSIS — Z6841 Body Mass Index (BMI) 40.0 and over, adult: Secondary | ICD-10-CM

## 2023-03-06 DIAGNOSIS — Z8709 Personal history of other diseases of the respiratory system: Secondary | ICD-10-CM

## 2023-03-06 DIAGNOSIS — Z86718 Personal history of other venous thrombosis and embolism: Secondary | ICD-10-CM

## 2023-03-06 DIAGNOSIS — I5032 Chronic diastolic (congestive) heart failure: Secondary | ICD-10-CM | POA: Diagnosis not present

## 2023-03-06 DIAGNOSIS — F330 Major depressive disorder, recurrent, mild: Secondary | ICD-10-CM | POA: Diagnosis not present

## 2023-03-06 DIAGNOSIS — Z09 Encounter for follow-up examination after completed treatment for conditions other than malignant neoplasm: Secondary | ICD-10-CM

## 2023-03-06 DIAGNOSIS — Z7985 Long-term (current) use of injectable non-insulin antidiabetic drugs: Secondary | ICD-10-CM

## 2023-03-06 DIAGNOSIS — G4700 Insomnia, unspecified: Secondary | ICD-10-CM

## 2023-03-06 MED ORDER — SPIRONOLACTONE 25 MG PO TABS: 25.0000 mg | ORAL_TABLET | Freq: Every day | ORAL | 0 refills | Status: AC

## 2023-03-06 MED ORDER — PAROXETINE HCL 20 MG PO TABS: 20.0000 mg | ORAL_TABLET | Freq: Every day | ORAL | 6 refills | Status: DC

## 2023-03-06 MED ORDER — TORSEMIDE 20 MG PO TABS: 40.0000 mg | ORAL_TABLET | Freq: Every day | ORAL | 1 refills | Status: DC

## 2023-03-06 MED ORDER — MELATONIN 3 MG PO TABS: 3.0000 mg | ORAL_TABLET | Freq: Every day | ORAL | 1 refills | Status: DC

## 2023-03-06 MED ORDER — SEMAGLUTIDE (2 MG/DOSE) 8 MG/3ML ~~LOC~~ SOPN: 2.0000 mg | PEN_INJECTOR | SUBCUTANEOUS | 4 refills | Status: DC

## 2023-03-06 MED ORDER — POTASSIUM CHLORIDE CRYS ER 20 MEQ PO TBCR: 40.0000 meq | EXTENDED_RELEASE_TABLET | Freq: Every day | ORAL | 2 refills | Status: DC

## 2023-03-06 MED ORDER — NITROGLYCERIN 0.4 MG SL SUBL: 0.4000 mg | SUBLINGUAL_TABLET | SUBLINGUAL | 0 refills | Status: DC | PRN

## 2023-03-06 MED ORDER — DULERA 200-5 MCG/ACT IN AERO: INHALATION_SPRAY | RESPIRATORY_TRACT | 11 refills | Status: DC

## 2023-03-06 MED ORDER — GABAPENTIN 400 MG PO CAPS: 400.0000 mg | ORAL_CAPSULE | Freq: Three times a day (TID) | ORAL | 1 refills | Status: DC

## 2023-03-06 MED ORDER — APIXABAN 5 MG PO TABS: 5.0000 mg | ORAL_TABLET | Freq: Two times a day (BID) | ORAL | 1 refills | Status: DC

## 2023-03-06 MED ORDER — ATORVASTATIN CALCIUM 40 MG PO TABS: 40.0000 mg | ORAL_TABLET | Freq: Every day | ORAL | 1 refills | Status: DC

## 2023-03-06 NOTE — Progress Notes (Signed)
Patient ID: [name], female    DOB: 1971-03-15  MRN: [id number]  CC: Diabetes (DM f/u./Patient stated that she wants to use [facility] - awaiting provider authorization/Discuss flu vax)   Subjective: [name] is a 52 y.o. female who presents for hosp f/u/chronic ds management. 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  EGD and colonoscopy negative, chronic ulcer left lower extremity,    Since last visit with me, patient was hospitalized in August with dizziness and vertigo.  Seen by neurology.  Workup was negative.  MRI of the brain was negative.  Discharge with Valium and meclizine and was to follow-up with ENT.   Readmitted in September from SNF with lower extremity edema, 50 pounds weight gain and decompensated CHF.  She was diuresed appropriately.  Transferred to SNF.  She is now back at home. -She has seen cardiology and follow-up 02/25/2023. -She brings all of her prescriptions with her that were given on discharge from SNF.  Would like all prescriptions to go to [facility] mail order except the Ozempic.  Diastolic CHF/CAD: Denies any lower remedy edema, PND, orthopnea or palpitations since discharge from SNF.  No chest pains. -Compliant with atorvastatin 40 mg daily, spironolactone 25 mg daily, torsemide 40 mg daily  DM: Lab Results  Component Value Date   HGBA1C 5.4 01/08/2023  Doing good on Ozempic 1 mg once a week.  Would like the dose increased to help bring about further weight loss.  Reports decreased appetite with Ozempic.  No longer on Farxiga.  States this was discontinued while at SNF due to recurrent vaginal yeast.  Asthma: Reports she is doing okay with  Dulera inhaler.  She uses albuterol inhaler only occasionally.  Reports she was receiving melatonin while at SNF to help with sleep.  She would like for me to send a prescription to the pharmacy for that.  History of blood clots: Reports compliance with taking Eliquis.  No bruising or bleeding on the medication.  History of depression: Doing good on Paxil.  Patient Active Problem List   Diagnosis Date Noted   Peripheral edema 01/16/2023   Chronic diastolic CHF (congestive heart failure) (HCC) 01/16/2023   Lack of motivation 01/08/2023   Anasarca 01/07/2023   Orthostatic dizziness 11/27/2022   Vertigo 11/26/2022   Type 2 diabetes mellitus with morbid obesity (HCC) 09/18/2022   Moderate major depression (HCC) 05/07/2022   Hyperlipidemia with target LDL less than 100 02/17/2022   Pain in thoracic spine 09/25/2021   Low back pain 09/25/2021   OSA (obstructive sleep apnea) 07/11/2021   Depression 05/28/2021   Morbid obesity with BMI of 50.0-59.9, adult (HCC) 05/26/2021   Bilateral lower extremity edema 11/22/2020   History of DVT (deep vein thrombosis) 11/22/2020   Asthma, chronic, moderate persistent, with acute exacerbation 11/22/2020   Lymphedema 06/09/2020   Iron deficiency anemia due to chronic blood loss 05/10/2020   Venous insufficiency 04/13/2020   S/P insertion of IVC (inferior vena caval) filter 04/13/2020   Chronic anemia 04/13/2020   Essential hypertension 04/13/2020   (HFpEF) heart failure with preserved ejection fraction (HCC) -> although echo suggests normal diastolic parameters with normal left atrial size 04/13/2020   CAD (coronary artery disease) 11/17/2019  Current Outpatient Medications on File Prior to Visit  Medication Sig Dispense Refill   atorvastatin (LIPITOR) 40 MG tablet Take 1 tablet (40 mg total) by mouth daily. 90 tablet 1   butalbital-acetaminophen-caffeine (FIORICET) 50-325-40 MG tablet Take 1 tablet by mouth every 6 (six) hours as needed for  headache. 14 tablet 0   cholecalciferol (VITAMIN D3) 25 MCG (1000 UNIT) tablet Take 1 tablet (1,000 Units total) by mouth daily. 30 tablet 5   diazepam (VALIUM) 2 MG tablet Take 1 tablet (2 mg total) by mouth every 8 (eight) hours as needed (dizziness). 15 tablet 0   docusate sodium (COLACE) 100 MG capsule Take 1 capsule (100 mg total) by mouth 2 (two) times daily as needed for mild constipation.     DULERA 200-5 MCG/ACT AERO INHALE 2 PUFFS EVERY MORNING AND 2 PUFFS EVERY NIGHT AT BEDTIME (Patient taking differently: Inhale 2 puffs into the lungs See admin instructions. INHALE 2 PUFFS EVERY MORNING AND 2 PUFFS EVERY NIGHT AT BEDTIME) 13 g 3   ELIQUIS 5 MG TABS tablet TAKE 1 TABLET BY MOUTH TWICE A DAY 180 tablet 1   FEROSUL 325 (65 Fe) MG tablet TAKE ONE TABLET BY MOUTH DAILY WITH BREAKFAST 100 tablet 0   gabapentin (NEURONTIN) 400 MG capsule Take 1 capsule (400 mg total) by mouth 3 (three) times daily. 90 capsule 1   hydrOXYzine (ATARAX) 25 MG tablet Take 1 tablet (25 mg total) by mouth at bedtime as needed for itching or anxiety. 30 tablet 0   nitroGLYCERIN (NITROSTAT) 0.4 MG SL tablet Place 1 tablet (0.4 mg total) under the tongue every 5 (five) minutes as needed for chest pain. 30 tablet 0   oxyCODONE (OXY IR/ROXICODONE) 5 MG immediate release tablet Take 5 mg by mouth 4 (four) times daily as needed.     PARoxetine (PAXIL) 20 MG tablet TAKE 1 TABLET BY MOUTH DAILY 30 tablet 1   potassium chloride SA (KLOR-CON M) 20 MEQ tablet Take 1 tablet (20 mEq total) by mouth daily. 30 tablet 2   Semaglutide, 1 MG/DOSE, 4 MG/3ML SOPN Inject 1 mg as directed once a week. 3 mL 4   vitamin B-12 (CYANOCOBALAMIN) 1000 MCG tablet Take 1,000 mcg by mouth daily.     spironolactone (ALDACTONE) 25 MG tablet Take 1 tablet (25 mg total) by mouth daily. 30 tablet 0   torsemide (DEMADEX) 20 MG tablet Take 2 tablets (40 mg total) by mouth daily. 60 tablet 0   No current facility-administered medications on file prior to  visit.    Allergies  Allergen Reactions   Ace Inhibitors Rash and Other (See Comments)    Make pt bleed   Aspirin Other (See Comments)    Per patient paperwork: blood clot?  Likely because of chronic DOAC   Hydromorphone Hives and Itching   Vancomycin Itching and Rash   Contrast Media [Iodinated Contrast Media] Hives   Dilaudid [Hydromorphone Hcl] Hives   Lidocaine Itching    Itching with Lidocaine patch reported 06/13/2021 vis telephone message.    Social History   Socioeconomic History   Marital status: Single    Spouse name: Not on file   Number of children: 0   Years of education: Not on file   Highest education level: Some college, no degree  Occupational History   Occupation: unemployed on disablity  Tobacco Use   Smoking status: Former    Current packs/day: 0.00    Average packs/day: 0.3 packs/day for 18.0 years (4.5 ttl pk-yrs)  Types: Cigarettes    Start date: 04/29/1984    Quit date: 04/29/2002    Years since quitting: 20.8   Smokeless tobacco: Never  Vaping Use   Vaping status: Never Used  Substance and Sexual Activity   Alcohol use: Not Currently   Drug use: Not Currently   Sexual activity: Not Currently  Other Topics Concern   Not on file  Social History Narrative   Not on file   Social Determinants of Health   Financial Resource Strain: Low Risk  (03/06/2023)   Overall Financial Resource Strain (CARDIA)    Difficulty of Paying Living Expenses: Not hard at all  Food Insecurity: No Food Insecurity (03/06/2023)   Hunger Vital Sign    Worried About Running Out of Food in the Last Year: Never true    Ran Out of Food in the Last Year: Never true  Transportation Needs: No Transportation Needs (03/06/2023)   PRAPARE - Administrator, Civil Service (Medical): No    Lack of Transportation (Non-Medical): No  Physical Activity: Inactive (03/06/2023)   Exercise Vital Sign    Days of Exercise per Week: 0 days    Minutes of Exercise per Session: 0 min   Stress: Stress Concern Present (03/06/2023)   [employer] of Occupational Health - Occupational Stress Questionnaire    Feeling of Stress : Rather much  Social Connections: Moderately Integrated (03/06/2023)   Social Connection and Isolation Panel [NHANES]    Frequency of Communication with Friends and Family: Twice a week    Frequency of Social Gatherings with Friends and Family: Twice a week    Attends Religious Services: More than 4 times per year    Active Member of Clubs or Organizations: Yes    Attends Banker Meetings: More than 4 times per year    Marital Status: Never married  Intimate Partner Violence: Not At Risk (03/06/2023)   Humiliation, Afraid, Rape, and Kick questionnaire    Fear of Current or Ex-Partner: No    Emotionally Abused: No    Physically Abused: No    Sexually Abused: No    Family History  Problem Relation Age of Onset   Diabetes Mellitus II Mother    COPD Father    Diabetes Father    Colon cancer Father    Diabetes Mellitus II Maternal Grandmother    Breast cancer Paternal Grandfather    Liver disease Neg Hx    Esophageal cancer Neg Hx     Past Surgical History:  Procedure Laterality Date   ABDOMINAL WALL DEFECT REPAIR  1970   IR CV LINE INJECTION  10/24/2020   IR REMOVAL TUN ACCESS W/ PORT W/O FL MOD SED  05/28/2021   IVC FILTER INSERTION  2017   Lower Extremity Venous Duplex  06/23/2020   No evidence of DVT or superficial thrombosis bilaterally.  No evidence of deep venous insufficiency bilaterally.  No evidence of SSV reflux.  Right GSV in the calf has reflux, no reflux in L GSV.;  Repeated in July 2020-no DVT   OOPHORECTOMY  1996   OOPHORECTOMY  1997   PORTACATH PLACEMENT  2014   TRANSTHORACIC ECHOCARDIOGRAM  05/18/2021   EF 60 to 65%.  No RWMA.  Mild concentric LVH.  "Normal diastolic parameters ".  Normal longitudinal strain.  Normal PAP, RAP.  Normal aortic and mitral valves.==> In July 2022, echo read as GR 1 DD otherwise  stable.    ROS: Review of Systems Negative except as stated  above  PHYSICAL EXAM: BP 110/79 (BP Location: Left Arm, Patient Position: Sitting, Cuff Size: Large)   Pulse 82   Temp 97.6 F (36.4 C) (Oral)   Ht 5\' 9"  (1.753 m)   Wt 287 lb (130.2 kg)   SpO2 100%   BMI 42.38 kg/m   Wt Readings from Last 3 Encounters:  03/06/23 287 lb (130.2 kg)  02/25/23 281 lb (127.5 kg)  02/03/23 293 lb 3.2 oz (133 kg)    Physical Exam  General appearance - alert, well appearing, and in no distress.  Patient stutters when she talks. Mental status - normal mood, behavior, speech, dress, motor activity, and thought processes Chest - clear to auscultation, no wheezes, rales or rhonchi, symmetric air entry Heart - normal rate, regular rhythm, normal S1, S2, no murmurs, rubs, clicks or gallops Extremities - peripheral pulses normal, no pedal edema, no clubbing or cyanosis      Latest Ref Rng & Units 02/03/2023    3:03 PM 01/18/2023    8:27 AM 01/17/2023    5:45 AM  CMP  Glucose 70 - 99 mg/dL 161  096  045   BUN 6 - 20 mg/dL 12  24  19    Creatinine 0.44 - 1.00 mg/dL 4.09  8.11  9.14   Sodium 135 - 145 mmol/L 136  133  137   Potassium 3.5 - 5.1 mmol/L 3.1  3.8  3.6   Chloride 98 - 111 mmol/L 94  91  94   CO2 22 - 32 mmol/L 31  30  31    Calcium 8.9 - 10.3 mg/dL 9.3  9.9  9.5   Total Protein 6.5 - 8.1 g/dL 7.7     Total Bilirubin 0.3 - 1.2 mg/dL 1.0     Alkaline Phos 38 - 126 U/L 75     AST 15 - 41 U/L 16     ALT 0 - 44 U/L 12      Lipid Panel     Component Value Date/Time   CHOL 129 09/05/2022 1438   TRIG 70 09/05/2022 1438   HDL 49 09/05/2022 1438   CHOLHDL 2.6 09/05/2022 1438   LDLCALC 66 09/05/2022 1438    CBC    Component Value Date/Time   WBC 5.8 01/12/2023 0732   RBC 3.67 (L) 01/12/2023 0732   HGB 9.7 (L) 01/12/2023 0732   HGB 11.7 (L) 10/07/2022 1114   HGB 12.3 01/31/2022 1341   HCT 33.9 (L) 01/12/2023 0732   HCT 38.8 01/31/2022 1341   PLT 175 01/12/2023 0732   PLT  153 10/07/2022 1114   PLT 109 (L) 01/31/2022 1341   MCV 92.4 01/12/2023 0732   MCV 87 01/31/2022 1341   MCH 26.4 01/12/2023 0732   MCHC 28.6 (L) 01/12/2023 0732   RDW 15.0 01/12/2023 0732   RDW 12.3 01/31/2022 1341   LYMPHSABS 0.6 (L) 01/08/2023 0828   LYMPHSABS 0.5 (L) 11/29/2020 1445   MONOABS 0.4 01/08/2023 0828   EOSABS 0.1 01/08/2023 0828   EOSABS 0.0 11/29/2020 1445   BASOSABS 0.0 01/08/2023 0828   BASOSABS 0.0 11/29/2020 1445      03/06/2023    2:59 PM 09/05/2022    1:47 PM 05/07/2022    1:49 PM  Depression screen PHQ 2/9  Decreased Interest 0 0 0  Down, Depressed, Hopeless 0 0 0  PHQ - 2 Score 0 0 0  Altered sleeping 2 0 0  Tired, decreased energy 2 1 1   Change in appetite 0 0 0  Feeling bad or failure about yourself  0 0 0  Trouble concentrating 0 0 0  Moving slowly or fidgety/restless 0 0 0  Suicidal thoughts 0 0 0  PHQ-9 Score 4 1 1   Difficult doing work/chores Somewhat difficult      ASSESSMENT AND PLAN: 1. Hospital discharge follow-up   2. Diastolic CHF, chronic (HCC) Stable.  Continue torsemide, spironolactone, potassium.  No longer on Farxiga due to vaginal yeast infections. - torsemide (DEMADEX) 20 MG tablet; Take 2 tablets (40 mg total) by mouth daily.  Dispense: 180 tablet; Refill: 1 - spironolactone (ALDACTONE) 25 MG tablet; Take 1 tablet (25 mg total) by mouth daily.  Dispense: 30 tablet; Refill: 0 - potassium chloride SA (KLOR-CON M) 20 MEQ tablet; Take 2 tablets (40 mEq total) by mouth daily.  Dispense: 180 tablet; Refill: 2 - Basic Metabolic Panel  3. Type 2 diabetes mellitus with morbid obesity (HCC) At goal with A1c. She is tolerating Ozempic 1 mg.  She would like to have the dose increased to achieve greater weight loss.  We agreed to increase to 2 mg once a week.  Advised to let me know if she develops any side effects like vomiting, severe diarrhea/constipation, abdominal pain. - gabapentin (NEURONTIN) 400 MG capsule; Take 1 capsule (400 mg  total) by mouth 3 (three) times daily.  Dispense: 270 capsule; Refill: 1 - Semaglutide, 2 MG/DOSE, 8 MG/3ML SOPN; Inject 2 mg as directed once a week.  Dispense: 3 mL; Refill: 4  4. Coronary artery disease involving native coronary artery of native heart without angina pectoris Stable.  Continue atorvastatin - atorvastatin (LIPITOR) 40 MG tablet; Take 1 tablet (40 mg total) by mouth daily.  Dispense: 90 tablet; Refill: 1 - nitroGLYCERIN (NITROSTAT) 0.4 MG SL tablet; Place 1 tablet (0.4 mg total) under the tongue every 5 (five) minutes as needed for chest pain.  Dispense: 30 tablet; Refill: 0  5. History of DVT (deep vein thrombosis) - apixaban (ELIQUIS) 5 MG TABS tablet; Take 1 tablet (5 mg total) by mouth 2 (two) times daily.  Dispense: 180 tablet; Refill: 1  6. History of asthma Stable on Dulera - mometasone-formoterol (DULERA) 200-5 MCG/ACT AERO; INHALE 2 PUFFS EVERY MORNING AND 2 PUFFS EVERY NIGHT AT BEDTIME  Dispense: 13 g; Refill: 11  7. Mild major depression (HCC) - PARoxetine (PAXIL) 20 MG tablet; Take 1 tablet (20 mg total) by mouth daily.  Dispense: 30 tablet; Refill: 6  8. Insomnia, unspecified type Prescription sent for melatonin 3 mg.  9. Encounter for immunization - Flu vaccine trivalent PF, 6mos and older(Flulaval,Afluria,Fluarix,Fluzone)     Patient was given the opportunity to ask questions.  Patient verbalized understanding of the plan and was able to repeat key elements of the plan.   This documentation was completed using Paediatric nurse.  Any transcriptional errors are unintentional.  No orders of the defined types were placed in this encounter.    Requested Prescriptions    No prescriptions requested or ordered in this encounter    No follow-ups on file.  [name], MD, FACP

## 2023-03-06 NOTE — Telephone Encounter (Signed)
Patient seen for an appointment today 03/06/2023. All prescriptions sent to [facility]. No further action needed at this time.

## 2023-03-07 ENCOUNTER — Ambulatory Visit: Payer: Self-pay | Admitting: *Deleted

## 2023-03-07 NOTE — Telephone Encounter (Signed)
Message from [place] B sent at 03/07/2023 10:20 AM EST  Summary: med ?   Marchelle Folks from [facility] pharmacy called in says pt picked up 1mg  Ozempic, but there is another RX for 2mg  Ozempic. They need to know should she finish up the 1mg  first , or completely stop it and go to the 2mg           Call History  Contact Date/Time Type Contact Phone/Fax User  03/07/2023 10:18 AM EST Phone (Incoming) [name] [id number] - [place], [place] - [address] (Pharmacy) [phone] Glean Salen   Reason for Disposition  [1] Caller has URGENT medicine question about med that PCP or specialist prescribed AND [2] triager unable to answer question  Answer Assessment - Initial Assessment Questions 1. NAME of MEDICINE: "What medicine(s) are you calling about?"     Ozempic 2. QUESTION: "What is your question?" (e.g., double dose of medicine, side effect)     See pharmacy message from [facility] with [facility] regarding the Ozempic. 3. PRESCRIBER: "Who prescribed the medicine?" Reason: if prescribed by specialist, call should be referred to that group.     Dr. [name] 4. SYMPTOMS: "Do you have any symptoms?" If Yes, ask: "What symptoms are you having?"  "How bad are the symptoms (e.g., mild, moderate, severe)     N/A 5. PREGNANCY:  "Is there any chance that you are pregnant?" "When was your last menstrual period?"     N/A  Protocols used: Medication Question Call-A-AH

## 2023-03-07 NOTE — Telephone Encounter (Signed)
Spoke with Marchelle Folks at [facility] clarification  per PCP last OV notes patient is to take  Semaglutide, 2 MG/DOSE, 8 MG/3ML SOPN; Inject 2 mg as directed once a week.  Dispense: 3 mL; Refill: 4

## 2023-03-07 NOTE — Telephone Encounter (Signed)
  Chief Complaint: See pharmacy message from [facility].   Marchelle Folks has a question regarding the Ozempic provider will need to address.    Symptoms: N/A Frequency: N/A Pertinent Negatives: Patient denies N/A Disposition: [] ED /[] Urgent Care (no appt availability in office) / [] Appointment(In office/virtual)/ []  [facility]/ [] Home Care/ [] Refused Recommended Disposition /[] [facility]/ [x]  Follow-up with PCP Additional Notes: Pharmacy message sent to Dr. Laural Benes for clarification.

## 2023-03-10 ENCOUNTER — Other Ambulatory Visit: Payer: Self-pay | Admitting: Internal Medicine

## 2023-03-10 DIAGNOSIS — I2510 Atherosclerotic heart disease of native coronary artery without angina pectoris: Secondary | ICD-10-CM

## 2023-03-10 DIAGNOSIS — I5032 Chronic diastolic (congestive) heart failure: Secondary | ICD-10-CM

## 2023-03-11 ENCOUNTER — Other Ambulatory Visit: Payer: Self-pay | Admitting: Internal Medicine

## 2023-03-11 DIAGNOSIS — E1169 Type 2 diabetes mellitus with other specified complication: Secondary | ICD-10-CM

## 2023-03-11 NOTE — Telephone Encounter (Signed)
Medication Refill -  Most Recent Primary Care Visit:  Provider: [name] B  Department: [facility]  Visit Type: HOSPITAL FU  Date: 03/06/2023  Medication: gabapentin (NEURONTIN) 400 MG capsule  amLODipine (NORVASC) 5 MG tablet methocarbamol (ROBAXIN) tablet 500 mg dapagliflozin propanediol (FARXIGA) 10 MG TABS tablet hydrOXYzine (ATARAX) 25 MG tablet diazepam (VALIUM) 2 MG tablet   Has the patient contacted their pharmacy? Yes  Is this the correct pharmacy for this prescription? Yes If no, delete pharmacy and type the correct one.  This is the patient's preferred pharmacy: [facility] [place] - [name] [place] [address] Phone: [phone] Fax: [phone]  Has the prescription been filled recently? Yes  Is the patient out of the medication? Yes  Has the patient been seen for an appointment in the last year OR does the patient have an upcoming appointment? Yes  Can we respond through MyChart? Yes  Agent: Please be advised that Rx refills may take up to 3 business days. We ask that you follow-up with your pharmacy.

## 2023-03-11 NOTE — Telephone Encounter (Signed)
Both meds refilled 03/06/23 #30 each  Requested Prescriptions  Refused Prescriptions Disp Refills   spironolactone (ALDACTONE) 25 MG tablet [Pharmacy Med Name: SPIRONOLACTONE 25 MG TABS 25 Tablet] 30 tablet 10    Sig: TAKE 1 TABLET BY MOUTH DAILY     Cardiovascular: Diuretics - Aldosterone Antagonist Failed - 03/10/2023  8:03 AM      Failed - K in normal range and within 180 days    Potassium  Date Value Ref Range Status  02/03/2023 3.1 (L) 3.5 - 5.1 mmol/L Final         Passed - Cr in normal range and within 180 days    Creatinine  Date Value Ref Range Status  10/07/2022 0.96 0.44 - 1.00 mg/dL Final   Creatinine, Ser  Date Value Ref Range Status  02/03/2023 0.98 0.44 - 1.00 mg/dL Final         Passed - Na in normal range and within 180 days    Sodium  Date Value Ref Range Status  02/03/2023 136 135 - 145 mmol/L Final  01/03/2022 144 134 - 144 mmol/L Final         Passed - eGFR is 30 or above and within 180 days    GFR calc Af Amer  Date Value Ref Range Status  01/20/2020 >60 >60 mL/min Final   GFR, Estimated  Date Value Ref Range Status  02/03/2023 >60 >60 mL/min Final    Comment:    (NOTE) Calculated using the CKD-EPI Creatinine Equation (2021)   10/07/2022 >60 >60 mL/min Final    Comment:    (NOTE) Calculated using the CKD-EPI Creatinine Equation (2021)    eGFR  Date Value Ref Range Status  01/03/2022 87 >59 mL/min/1.73 Final         Passed - Last BP in normal range    BP Readings from Last 1 Encounters:  03/06/23 110/79         Passed - Valid encounter within last 6 months    Recent Outpatient Visits           5 days ago Hospital discharge follow-up   [facility] Dept Of [place]. [facility] [name]   6 months ago Type 2 diabetes mellitus with morbid obesity [facility])   [facility] - A Dept Of [place]. [facility] [name]   10 months ago Type 2 diabetes  mellitus with morbid obesity [facility])   [place] [facility] - A Dept Of [place]. [facility] [name]   1 year ago Type 2 diabetes mellitus with morbid obesity [facility])   [place] [facility] - A Dept Of [place]. [facility] [name], [name]   1 year ago Dysuria   [facility] [facility] [place] - A Dept Of [place]. [facility] [name]       Future Appointments             In 2 months [name], [name] [facility]   In 4 months [name] [facility] - A Dept Of [place]igha Bridegroom. [facility]             nitroGLYCERIN (NITROSTAT) 0.4 MG SL tablet [Pharmacy Med Name: NITROGLYC 0.4MG  25CT SL T 0.4 Tablet] 25 tablet 10    Sig:  DISSOLVE 1 TABLET UNDER THE TONGUE AS NEEDED FOR CHEST PAIN EVERY 5 MINUTES UP TO 3 TIMES. IF NO RELIEF CALL 911.     Cardiovascular:  Nitrates Passed - 03/10/2023  8:03 AM      Passed - Last BP in normal range    BP Readings from Last 1 Encounters:  03/06/23 110/79         Passed - Last Heart Rate in normal range    Pulse Readings from Last 1 Encounters:  03/06/23 82         Passed - Valid encounter within last 12 months    Recent Outpatient Visits           5 days ago Hospital discharge follow-up   [facility] [facility] [place] - A Dept Of [place]. [facility] [name]   6 months ago Type 2 diabetes mellitus with morbid obesity [facility])   [place] [facility] - A Dept Of [place]. [facility] [name]   10 months ago Type 2 diabetes mellitus with morbid obesity [facility])   [place] [facility] - A Dept Of [place]. [facility] [name]   1 year ago Type 2 diabetes mellitus with morbid obesity [facility])   [place] [facility] - A Dept Of [place]. [facility] [name],  [name]   1 year ago Dysuria   [place] [facility] [place] - A Dept Of . [facility] [name]       Future Appointments             In 2 months [name], [name] [facility] at [facility]   In 4 months [name] [facility] - A Dept Of [place] Bridegroom. [facility]

## 2023-03-12 NOTE — Telephone Encounter (Signed)
[facility] is at [facility] 03/06/23 #270 caps 1 RF Hydroxyzine and Valium and amlodipine ordered by different provider at hospital or UC Valium not delegated to NT to RF Methocarbamol expired 11/30/22 Amlodipine had been stopped at discharge Requested Prescriptions  Pending Prescriptions Disp Refills   hydrOXYzine (ATARAX) 25 MG tablet 30 tablet     Sig: Take 1 tablet (25 mg total) by mouth at bedtime as needed for itching or anxiety.     Ear, Nose, and Throat:  Antihistamines 2 Passed - 03/11/2023  4:32 PM      Passed - Cr in normal range and within 360 days    Creatinine  Date Value Ref Range Status  10/07/2022 0.96 0.44 - 1.00 mg/dL Final   Creatinine, Ser  Date Value Ref Range Status  02/03/2023 0.98 0.44 - 1.00 mg/dL Final         Passed - Valid encounter within last 12 months    Recent Outpatient Visits           6 days ago Hospital discharge follow-up   [place] [facility] - A Dept Of [place]. [facility] [name] B, MD   6 months ago Type 2 diabetes mellitus with morbid obesity [facility])   [place] Comm [facility] Merry Proud - A Dept Of [place]. [facility] [name] B, MD   10 months ago Type 2 diabetes mellitus with morbid obesity [facility])   [place] Comm [facility] Merry Proud - A Dept Of [place]. [facility] [name] B, MD   1 year ago Type 2 diabetes mellitus with morbid obesity [facility])   [place] Comm [facility] Merry Proud - A Dept Of [place]. [facility] Drucilla Chalet, RPH-CPP   1 year ago Dysuria   [place] Comm [facility] [place] - A Dept Of [place]. [facility] Marcine Matar, MD       Future Appointments             In 2 months Laural Benes [name], PA-C [facility]   In 4 months Laural Benes, Binnie Rail, MD [facility] Health Comm Health Merry Proud - A Dept Of Eligha Bridegroom. [facility]             diazepam (VALIUM) 2 MG tablet 15 tablet     Sig: Take  1 tablet (2 mg total) by mouth every 8 (eight) hours as needed (dizziness).     Not Delegated - Psychiatry: Anxiolytics/Hypnotics 2 Failed - 03/11/2023  4:32 PM      Failed - This refill cannot be delegated      Failed - Urine Drug Screen completed in last 360 days      Passed - Patient is not pregnant      Passed - Valid encounter within last 6 months    Recent Outpatient Visits           6 days ago Hospital discharge follow-up   [place] [facility] - A Dept Of [place]. [facility] [name] B, MD   6 months ago Type 2 diabetes mellitus with morbid obesity [facility])   [place] [facility] Proud - A Dept Of [place]. [facility] [name] B, MD   10 months ago Type 2 diabetes mellitus with morbid obesity [facility])   [place] Comm [facility]ry Proud - A Dept Of [place]. [facility] Marcine Matar, MD  1 year ago Type 2 diabetes mellitus with morbid obesity (HCC)   [place] Comm [facility] Wellnss - A Dept Of [place]. [facility] Drucilla Chalet, RPH-CPP   1 year ago Dysuria   [place] Comm [facility] [place] - A Dept Of [place]. [facility] Marcine Matar, MD       Future Appointments             In 2 months Laural Benes [name], PA-C [facility]   In 4 months Laural Benes, Binnie Rail, MD [facility] Merry Proud - A Dept Of Eligha Bridegroom. [facility]             amLODipine (NORVASC) 5 MG tablet      Sig: Take 1 tablet (5 mg total) by mouth daily.     Cardiovascular: Calcium Channel Blockers 2 Passed - 03/11/2023  4:32 PM      Passed - Last BP in normal range    BP Readings from Last 1 Encounters:  03/06/23 110/79         Passed - Last Heart Rate in normal range    Pulse Readings from Last 1 Encounters:  03/06/23 82         Passed - Valid encounter within last 6 months    Recent Outpatient Visits           6 days ago Hospital discharge  follow-up   [place] Comm [facility] [place]uc[place] - A Dept Of [place]. [facility] [name] B, MD   6 months ago Type 2 diabetes mellitus with morbid obesity [facility])   [place] Comm [facility]ry Proud - A Dept Of [place]. [facility] [name] B, MD   10 months ago Type 2 diabetes mellitus with morbid obesity [facility])   [place] Comm [facility] Merry Proud - A Dept Of [place]. [facility] [name] B, MD   1 year ago Type 2 diabetes mellitus with morbid obesity [facility])   [place] Comm Health Merry Proud - A Dept Of [place]. [facility] Drucilla Chalet, RPH-CPP   1 year ago Dysuria   [place] Comm Health [place] - A Dept Of [place]. [facility] Marcine Matar, MD       Future Appointments             In 2 months Laural Benes [name], PA-C [place] [facility]   In 4 months Laural Benes, Binnie Rail, MD [facility] Comm [facility] Merry Proud - A Dept Of Eligha Bridegroom. [facility]             methocarbamol (ROBAXIN) 500 MG tablet 60 tablet     Sig: TAKE 1 TABLET BY MOUTH TWICE A DAY AS NEEDED FOR MUSCLE SPASMS Strength: 500 mg     Not Delegated - Analgesics:  Muscle Relaxants Failed - 03/11/2023  4:32 PM      Failed - This refill cannot be delegated      Passed - Valid encounter within last 6 months    Recent Outpatient Visits           6 days ago Hospital discharge follow-up   [place] Comm [facility] Pr[facility] - A Dept Of [place]. [facility] [name] B, MD   6 months ago Type 2 diabetes mellitus with morbid obesity [facility])   [place] Comm Health Wellnss - A  Dept Of [place]. [facility] [name] B, MD   10 months ago Type 2 diabetes mellitus with morbid obesity [facility])   [place] Comm [facility]ry Proud - A Dept Of [place]. [facility] [name] B, MD   1 year ago Type 2 diabetes mellitus with morbid obesity [facility])   [place] Comm Health  Merry Proud - A Dept Of [place]. [facility] Drucilla Chalet, RPH-CPP   1 year ago Dysuria   [place] Comm Health Conch[place] - A Dept Of [place]. [facility] Marcine Matar, MD       Future Appointments             In 2 months Laural Benes [name], PA-C [place] [facility]   In 4 months Laural Benes, Binnie Rail, MD [facility] Health Comm Health Merry Proud - A Dept Of Eligha Bridegroom. [facility]            Refused Prescriptions Disp Refills   gabapentin (NEURONTIN) 400 MG capsule 270 capsule     Sig: Take 1 capsule (400 mg total) by mouth 3 (three) times daily.     Neurology: Anticonvulsants - gabapentin Passed - 03/11/2023  4:32 PM      Passed - Cr in normal range and within 360 days    Creatinine  Date Value Ref Range Status  10/07/2022 0.96 0.44 - 1.00 mg/dL Final   Creatinine, Ser  Date Value Ref Range Status  02/03/2023 0.98 0.44 - 1.00 mg/dL Final         Passed - Completed PHQ-2 or PHQ-9 in the last 360 days      Passed - Valid encounter within last 12 months    Recent Outpatient Visits           6 days ago Hospital discharge follow-up   [place] Comm [facility]ry Proud - A Dept Of [place]. [facility] [name] B, MD   6 months ago Type 2 diabetes mellitus with morbid obesity [facility])   [place] Comm [facility] Merry Proud - A Dept Of [place]. [facility] [name] B, MD   10 months ago Type 2 diabetes mellitus with morbid obesity [facility])   [place] Comm [facility] Merry Proud - A Dept Of [place]. [facility] [name] B, MD   1 year ago Type 2 diabetes mellitus with morbid obesity [facility])   [place] Comm Health Merry Proud - A Dept Of [place]. [facility] Drucilla Chalet, RPH-CPP   1 year ago Dysuria   [place] Comm Health [place] - A Dept Of [place]. [facility] Marcine Matar, MD       Future Appointments             In 2 months Laural Benes  [name], PA-C [facility]   In 4 months Laural Benes, Binnie Rail, MD [facility] Health Comm Health Merry Proud - A Dept Of Eligha Bridegroom. [facility]

## 2023-03-12 NOTE — Telephone Encounter (Signed)
Requested medication (s) are due for refill today:yes  Requested medication (s) are on the active medication list: yes Last refill:  hydroxyzine 01/18/23 #30 tabs; Vailum:12/18/22 #15 tabs  Amlodpine was  dc'd 5 mg  by Berton Mount MD Methocarbamol written 11/15/22-11/30/22 #60 1 RF Future visit scheduled: yes  Notes to clinic:  pt wanting all med refills [facility]   Requested Prescriptions  Pending Prescriptions Disp Refills   hydrOXYzine (ATARAX) 25 MG tablet 30 tablet     Sig: Take 1 tablet (25 mg total) by mouth at bedtime as needed for itching or anxiety.     Ear, Nose, and Throat:  Antihistamines 2 Passed - 03/11/2023  4:32 PM      Passed - Cr in normal range and within 360 days    Creatinine  Date Value Ref Range Status  10/07/2022 0.96 0.44 - 1.00 mg/dL Final   Creatinine, Ser  Date Value Ref Range Status  02/03/2023 0.98 0.44 - 1.00 mg/dL Final         Passed - Valid encounter within last 12 months    Recent Outpatient Visits           6 days ago Hospital discharge follow-up   [place] [facility]ry Proud - A Dept Of [place]. [facility] [name] B, MD   6 months ago Type 2 diabetes mellitus with morbid obesity [facility])   [place] Comm [facility] Merry Proud - A Dept Of [place]. [facility] [name] B, MD   10 months ago Type 2 diabetes mellitus with morbid obesity [facility])   [place] Comm Health Merry Proud - A Dept Of [place]. [facility] [name] B, MD   1 year ago Type 2 diabetes mellitus with morbid obesity [facility])   [place] Comm Health Merry Proud - A Dept Of [place]. [facility] Drucilla Chalet, RPH-CPP   1 year ago Dysuria   [place] Comm Health Oaks - A Dept Of [place]. [facility] Marcine Matar, MD       Future Appointments             In 2 months Laural Benes [name], PA-C [place] [facility]   In 4 months Laural Benes, Binnie Rail, MD [facility] Comm Health Merry Proud - A Dept Of Eligha Bridegroom. [facility]             diazepam (VALIUM) 2 MG tablet 15 tablet     Sig: Take 1 tablet (2 mg total) by mouth every 8 (eight) hours as needed (dizziness).     Not Delegated - Psychiatry: Anxiolytics/Hypnotics 2 Failed - 03/11/2023  4:32 PM      Failed - This refill cannot be delegated      Failed - Urine Drug Screen completed in last 360 days      Passed - Patient is not pregnant      Passed - Valid encounter within last 6 months    Recent Outpatient Visits           6 days ago Hospital discharge follow-up    [facility] - A Dept Of [place]. [facility] [name] B, MD   6 months ago Type 2 diabetes mellitus with morbid obesity [facility])    Comm Health Merry Proud - A Dept Of [place]. [facility] Marcine Matar, MD   10 months ago  Type 2 diabetes mellitus with morbid obesity (HCC)   [place] Comm Health Merry Proud - A Dept Of [place]. [facility] [name] B, MD   1 year ago Type 2 diabetes mellitus with morbid obesity [facility])   [place] Comm Health Merry Proud - A Dept Of [place]. [facility] Drucilla Chalet, RPH-CPP   1 year ago Dysuria   [place] Comm Health [place] - A Dept Of [place]. [facility] Marcine Matar, MD       Future Appointments             In 2 months Laural Benes [name], PA-C [place] [facility]   In 4 months Laural Benes, Binnie Rail, MD [name] Comm Health Merry Proud - A Dept Of Eligha Bridegroom. [facility]             amLODipine (NORVASC) 5 MG tablet      Sig: Take 1 tablet (5 mg total) by mouth daily.     Cardiovascular: Calcium Channel Blockers 2 Passed - 03/11/2023  4:32 PM      Passed - Last BP in normal range    BP Readings from Last 1 Encounters:  03/06/23 110/79         Passed - Last Heart Rate in normal range    Pulse Readings from Last 1 Encounters:   03/06/23 82         Passed - Valid encounter within last 6 months    Recent Outpatient Visits           6 days ago Hospital discharge follow-up   [place] Comm Health [place] - A Dept Of [place]. [facility] [name] B, MD   6 months ago Type 2 diabetes mellitus with morbid obesity [facility])   [place] Comm Health Merry Proud - A Dept Of [place]. [facility] [name] B, MD   10 months ago Type 2 diabetes mellitus with morbid obesity [facility])   [place] Comm Health Merry Proud - A Dept Of [place]. [facility] [name] B, MD   1 year ago Type 2 diabetes mellitus with morbid obesity [facility])   [place] Comm Health Merry Proud - A Dept Of [place]. [facility] Drucilla Chalet, RPH-CPP   1 year ago Dysuria   [place] Comm Health [place] - A Dept Of . [facility] Marcine Matar, MD       Future Appointments             In 2 months Laural Benes [name], PA-C [place] [facility]   In 4 months Laural Benes, Binnie Rail, MD [facility] Health Comm Health Merry Proud - A Dept Of Eligha Bridegroom. [facility]             methocarbamol (ROBAXIN) 500 MG tablet 60 tablet     Sig: TAKE 1 TABLET BY MOUTH TWICE A DAY AS NEEDED FOR MUSCLE SPASMS Strength: 500 mg     Not Delegated - Analgesics:  Muscle Relaxants Failed - 03/11/2023  4:32 PM      Failed - This refill cannot be delegated      Passed - Valid encounter within last 6 months    Recent Outpatient Visits           6 days ago Hospital discharge follow-up   [place] Comm Health Merry Proud - A Dept Of [place]ia Dolly  [facility] [name] B, MD   6 months ago Type 2 diabetes mellitus with morbid obesity [facility])   [place] Comm Health Merry Proud - A Dept Of [place]. [facility] [name] B, MD   10 months ago Type 2 diabetes mellitus with morbid obesity [facility])   [place] Comm Health Merry Proud - A Dept Of [place].  [facility] [name] B, MD   1 year ago Type 2 diabetes mellitus with morbid obesity [facility])   [place] Comm Health Merry Proud - A Dept Of [place]. [facility] Drucilla Chalet, RPH-CPP   1 year ago Dysuria   [place] Comm Health [place] - A Dept Of [place]. [facility] Marcine Matar, MD       Future Appointments             In 2 months Laural Benes [name], PA-C [place] [facility]   In 4 months Laural Benes, Binnie Rail, MD [facility] Comm Health Merry Proud - A Dept Of Eligha Bridegroom. [facility]            Refused Prescriptions Disp Refills   gabapentin (NEURONTIN) 400 MG capsule 270 capsule     Sig: Take 1 capsule (400 mg total) by mouth 3 (three) times daily.     Neurology: Anticonvulsants - gabapentin Passed - 03/11/2023  4:32 PM      Passed - Cr in normal range and within 360 days    Creatinine  Date Value Ref Range Status  10/07/2022 0.96 0.44 - 1.00 mg/dL Final   Creatinine, Ser  Date Value Ref Range Status  02/03/2023 0.98 0.44 - 1.00 mg/dL Final         Passed - Completed PHQ-2 or PHQ-9 in the last 360 days      Passed - Valid encounter within last 12 months    Recent Outpatient Visits           6 days ago Hospital discharge follow-up   [place] Comm [facility] Merry Proud - A Dept Of [place]. [facility] [name] B, MD   6 months ago Type 2 diabetes mellitus with morbid obesity [facility])   [place] Comm Health Merry Proud - A Dept Of [place]. [facility] [name] B, MD   10 months ago Type 2 diabetes mellitus with morbid obesity [facility])   [place] Comm Health Merry Proud - A Dept Of [place]. [facility] [name] B, MD   1 year ago Type 2 diabetes mellitus with morbid obesity [facility])   [place] Comm Health Merry Proud - A Dept Of [place]. [facility] Drucilla Chalet, RPH-CPP   1 year ago Dysuria   [place] Comm Health [place] -  A Dept Of [place]. [facility] Marcine Matar, MD       Future Appointments             In 2 months Laural Benes [name], PA-C [place] [facility]   In 4 months Laural Benes, Binnie Rail, MD [facility] Health Comm Health Merry Proud - A Dept Of Eligha Bridegroom. [facility]

## 2023-03-13 ENCOUNTER — Ambulatory Visit: Payer: Managed Care, Other (non HMO) | Attending: Internal Medicine

## 2023-03-13 MED ORDER — HYDROXYZINE HCL 25 MG PO TABS: 25.0000 mg | ORAL_TABLET | Freq: Every evening | ORAL | 0 refills | Status: DC | PRN

## 2023-03-14 ENCOUNTER — Telehealth: Payer: Self-pay | Admitting: Internal Medicine

## 2023-03-14 LAB — BASIC METABOLIC PANEL
BUN/Creatinine Ratio: 11 (ref 9–23)
BUN: 8 mg/dL (ref 6–24)
CO2: 26 mmol/L (ref 20–29)
Calcium: 9.5 mg/dL (ref 8.7–10.2)
Chloride: 103 mmol/L (ref 96–106)
Creatinine, Ser: 0.7 mg/dL (ref 0.57–1.00)
Glucose: 86 mg/dL (ref 70–99)
Potassium: 3.8 mmol/L (ref 3.5–5.2)
Sodium: 143 mmol/L (ref 134–144)
eGFR: 103 mL/min/{1.73_m2} (ref >59)

## 2023-03-14 NOTE — Telephone Encounter (Signed)
Form received and placed in provider box. Awaiting signature.

## 2023-03-14 NOTE — Telephone Encounter (Signed)
Did you get this? If so will you make sure I get it and I will follow-up for  you.

## 2023-03-14 NOTE — Telephone Encounter (Signed)
Vonna Kotyk, with [facility] has called in regards to the patient reached out to them to order diabetic supplies and they had faxed over a form & request for office visit notes on 03/11/2023 to fax # [phone] and wanted to confirm this fax was received. Please follow up with Vonna Kotyk at phone # [phone].  [facility] [employer] Fax # [phone]

## 2023-03-15 ENCOUNTER — Other Ambulatory Visit: Payer: Self-pay | Admitting: Internal Medicine

## 2023-03-15 DIAGNOSIS — I2510 Atherosclerotic heart disease of native coronary artery without angina pectoris: Secondary | ICD-10-CM

## 2023-03-17 ENCOUNTER — Other Ambulatory Visit: Payer: Self-pay | Admitting: Internal Medicine

## 2023-03-17 ENCOUNTER — Other Ambulatory Visit: Payer: Self-pay | Admitting: Cardiology

## 2023-03-17 DIAGNOSIS — I5032 Chronic diastolic (congestive) heart failure: Secondary | ICD-10-CM

## 2023-03-18 ENCOUNTER — Telehealth: Payer: Self-pay | Admitting: Internal Medicine

## 2023-03-18 NOTE — Telephone Encounter (Signed)
Medication Refill -  Most Recent Primary Care Visit:  Provider: [facility]  Department: [facility]  Visit Type: LAB  Date: 03/13/2023  Medication:   Amlodipine 5 mg Methocarbamol 500 mg Farxiga 10 mg Diazipam 2 mg  Has the patient contacted their pharmacy? Yes (Agent: If no, request that the patient contact the pharmacy for the refill. If patient does not wish to contact the pharmacy document the reason why and proceed with request.) (Agent: If yes, when and what did the pharmacy advise?)  Is this the correct pharmacy for this prescription? Yes If no, delete pharmacy and type the correct one.  This is the patient's preferred pharmacy:   [facility]  Has the prescription been filled recently? Yes  Is the patient out of the medication? No  Has the patient been seen for an appointment in the last year OR does the patient have an upcoming appointment? Yes  Can we respond through MyChart? No  Agent: Please be advised that Rx refills may take up to 3 business days. We ask that you follow-up with your pharmacy.

## 2023-03-18 NOTE — Telephone Encounter (Signed)
Requested Prescriptions  Pending Prescriptions Disp Refills   spironolactone (ALDACTONE) 25 MG tablet [Pharmacy Med Name: SPIRONOLACTONE 25 MG TABS 25 Tablet] 30 tablet 10    Sig: TAKE 1 TABLET BY MOUTH DAILY     Cardiovascular: Diuretics - Aldosterone Antagonist Passed - 03/17/2023  4:47 PM      Passed - Cr in normal range and within 180 days    Creatinine  Date Value Ref Range Status  10/07/2022 0.96 0.44 - 1.00 mg/dL Final   Creatinine, Ser  Date Value Ref Range Status  03/13/2023 0.70 0.57 - 1.00 mg/dL Final         Passed - K in normal range and within 180 days    Potassium  Date Value Ref Range Status  03/13/2023 3.8 3.5 - 5.2 mmol/L Final         Passed - Na in normal range and within 180 days    Sodium  Date Value Ref Range Status  03/13/2023 143 134 - 144 mmol/L Final         Passed - eGFR is 30 or above and within 180 days    GFR calc Af Amer  Date Value Ref Range Status  01/20/2020 >60 >60 mL/min Final   GFR, Estimated  Date Value Ref Range Status  02/03/2023 >60 >60 mL/min Final    Comment:    (NOTE) Calculated using the CKD-EPI Creatinine Equation (2021)   10/07/2022 >60 >60 mL/min Final    Comment:    (NOTE) Calculated using the CKD-EPI Creatinine Equation (2021)    eGFR  Date Value Ref Range Status  03/13/2023 103 >59 mL/min/1.73 Final         Passed - Last BP in normal range    BP Readings from Last 1 Encounters:  03/06/23 110/79         Passed - Valid encounter within last 6 months    Recent Outpatient Visits           1 week ago Hospital discharge follow-up   [place] Comm Health [place] - A Dept Of [place]. [facility] [name] B, MD   6 months ago Type 2 diabetes mellitus with morbid obesity [facility])   [place] Comm Health Merry Proud - A Dept Of [place]. [facility] [name] B, MD   10 months ago Type 2 diabetes mellitus with morbid obesity [facility])   [place] Comm Health Merry Proud - A Dept Of  [place]. [facility] [name] B, MD   1 year ago Type 2 diabetes mellitus with morbid obesity [facility])   [place] Comm Health Merry Proud - A Dept Of . [facility] Drucilla Chalet, RPH-CPP   1 year ago Dysuria   [place] Comm Health [place] - A Dept Of . [facility] Marcine Matar, MD       Future Appointments             In 2 months Laural Benes [name], PA-C [facility] [facility]   In 3 months Laural Benes, Binnie Rail, MD [facility] Comm Health Merry Proud - A Dept Of Eligha Bridegroom. [facility]

## 2023-03-19 NOTE — Telephone Encounter (Signed)
Received message that patient is requesting refill on amlodipine, methocarbamol, Farxiga and diazepam (Valium). Let patient know that amlodipine was discontinued from recent hospitalization because blood pressure was running low.  On recent visit with me, blood pressure was good without it. -I informed her on her recent visit that I will not be giving refill on diazepam.  That was given from the hospital for short-term use.  Controlled substance which I will not be refilling. -She told me Marcelline Deist was discontinued while she was at skilled nursing facility due to recurrent yeast infection. -Methocarbamol was discontinued.

## 2023-03-19 NOTE — Telephone Encounter (Signed)
Called & spoke to the patient. Verified name & DOB. Informed patient of the following message from Dr.Lasch -   "Received message that patient is requesting refill on amlodipine, methocarbamol, Farxiga and diazepam (Valium). Let patient know that amlodipine was discontinued from recent hospitalization because blood pressure was running low.  On recent visit with me, blood pressure was good without it. -I informed her on her recent visit that I will not be giving refill on diazepam.  That was given from the hospital for short-term use.  Controlled substance which I will not be refilling. -She told me Marcelline Deist was discontinued while she was at skilled nursing facility due to recurrent yeast infection. -Methocarbamol was discontinued."  Patient expressed verbal understanding of all discussed. No further questions at this time.

## 2023-03-19 NOTE — Telephone Encounter (Signed)
[name] w/ [facility] calling to follow up on this request. Cb [phone]

## 2023-03-20 ENCOUNTER — Other Ambulatory Visit: Payer: Self-pay | Admitting: Internal Medicine

## 2023-03-20 DIAGNOSIS — Z86718 Personal history of other venous thrombosis and embolism: Secondary | ICD-10-CM

## 2023-03-24 NOTE — Telephone Encounter (Signed)
Noted  

## 2023-03-24 NOTE — Telephone Encounter (Signed)
  Chief Complaint: Advise Symptoms: NA Frequency: NA Pertinent Negatives: Patient denies NA Disposition: [] ED /[] Urgent Care (no appt availability in office) / [] Appointment(In office/virtual)/ []  [facility]/ [] Home Care/ [] Refused Recommended Disposition /[] [facility]/ [x]  Follow-up with PCP Additional Notes:  Pt called to review meds she is NOT to take. States pharmacy called her to advise amlodipine was ready to pick up.Reviewed message from PCP. Diazepam does remain on current med profile. Please review.

## 2023-04-03 ENCOUNTER — Ambulatory Visit: Payer: Self-pay

## 2023-04-03 NOTE — Telephone Encounter (Signed)
Vonna [facility]yk from [facility] called saying they need the last office notes sen to them before they can complete the order from the patient.  CB@  [phone]

## 2023-04-03 NOTE — Telephone Encounter (Signed)
Patient called, left VM to return the call to the office to speak to the NT.   Summary: Rx clarification   Pt requesting to go over the medications that were discontinued by Dr. Laural Benes. Pt states Dr. Laural Benes took her off 3 of the medications.

## 2023-04-03 NOTE — Telephone Encounter (Signed)
Message from Kemah L sent at 04/03/2023  3:34 PM EST  Summary: Rx clarification   Pt requesting to go over the medications that were discontinued by Dr. Laural Benes. Pt states Dr. Laural Benes took her off 3 of the medications.         Chief Complaint: pt wanting know names of meds that were dc 'd: Disposition: [] ED /[] Urgent Care (no appt availability in office) / [] Appointment(In office/virtual)/ []  [facility]/ [x] Home Care/ [] Refused Recommended Disposition /[] [facility]/ []  Follow-up with PCP Additional Notes: per chart review note dated 03/18/23 indicated Diazepam, methocarbamol, amlodipine were discontinued. A refill request on      was refused because it had already been reordered.  Reason for Disposition  Health Information question, no triage required and triager able to answer question  Answer Assessment - Initial Assessment Questions 1. REASON FOR CALL or QUESTION: "What is your reason for calling today?" or "How can I best help you?" or "What question do you have that I can help answer?"     Pt wanting to know names of medications that were discontinued  Protocols used: Information Only Call - No Triage-A-AH

## 2023-04-03 NOTE — Telephone Encounter (Signed)
FYI

## 2023-04-03 NOTE — Telephone Encounter (Signed)
What is the order for?  If it is for diabetic shoes, she would need to be seen.

## 2023-04-04 ENCOUNTER — Encounter: Payer: Self-pay | Admitting: Hematology

## 2023-04-07 NOTE — Telephone Encounter (Signed)
Left message on voicemail to return call.

## 2023-04-07 NOTE — Telephone Encounter (Signed)
Pt retured call.  She said she has not ordered any diabetic shoes.  She did not know about the encounter.

## 2023-04-08 NOTE — Telephone Encounter (Signed)
Pt called in says she didn't request order for diabetic shoes. Please cb to discuss

## 2023-04-08 NOTE — Telephone Encounter (Signed)
Left message on voicemail to return call.  Please schedule appt for patient to return to office for diabetic hoes.

## 2023-04-09 NOTE — Telephone Encounter (Signed)
Please disregard message about diabetic shoes. I misread one of the messages. Order did not state need for diabetic shoes but for diabetic supplies. Called & spoke to [place] at [facility] and was informed that the necessary information was received and order will be shipped out to patient soon. No further assistance is required.   Called & spoke to the patient. Verified name & DOB. Informed patient of my mistake and clarified that the order is for diabetic supplies and not diabetic shoes. Patient expressed verbal understanding will be awaiting delivery. No further assistance required at this time.

## 2023-04-10 ENCOUNTER — Other Ambulatory Visit: Payer: Self-pay

## 2023-04-10 DIAGNOSIS — D500 Iron deficiency anemia secondary to blood loss (chronic): Secondary | ICD-10-CM

## 2023-04-10 DIAGNOSIS — D649 Anemia, unspecified: Secondary | ICD-10-CM

## 2023-04-11 ENCOUNTER — Inpatient Hospital Stay: Payer: Commercial Managed Care - HMO | Admitting: Hematology

## 2023-04-11 ENCOUNTER — Inpatient Hospital Stay: Payer: Commercial Managed Care - HMO | Attending: Hematology

## 2023-04-11 VITALS — BP 105/88 | HR 88 | Temp 97.2°F | Resp 18 | Wt 276.3 lb

## 2023-04-11 DIAGNOSIS — Z87891 Personal history of nicotine dependence: Secondary | ICD-10-CM | POA: Insufficient documentation

## 2023-04-11 DIAGNOSIS — D649 Anemia, unspecified: Secondary | ICD-10-CM

## 2023-04-11 DIAGNOSIS — M549 Dorsalgia, unspecified: Secondary | ICD-10-CM | POA: Diagnosis not present

## 2023-04-11 DIAGNOSIS — R109 Unspecified abdominal pain: Secondary | ICD-10-CM | POA: Diagnosis not present

## 2023-04-11 DIAGNOSIS — E538 Deficiency of other specified B group vitamins: Secondary | ICD-10-CM | POA: Diagnosis not present

## 2023-04-11 DIAGNOSIS — D500 Iron deficiency anemia secondary to blood loss (chronic): Secondary | ICD-10-CM | POA: Diagnosis not present

## 2023-04-11 LAB — CBC WITH DIFFERENTIAL (CANCER CENTER ONLY)
Abs Immature Granulocytes: 0.03 10*3/uL (ref 0.00–0.07)
Basophils Absolute: 0 10*3/uL (ref 0.0–0.1)
Basophils Relative: 1 %
Eosinophils Absolute: 0.1 10*3/uL (ref 0.0–0.5)
Eosinophils Relative: 2 %
HCT: 38.8 % (ref 36.0–46.0)
Hemoglobin: 11.1 g/dL — ABNORMAL LOW (ref 12.0–15.0)
Immature Granulocytes: 1 %
Lymphocytes Relative: 13 %
Lymphs Abs: 0.7 10*3/uL (ref 0.7–4.0)
MCH: 25.9 pg — ABNORMAL LOW (ref 26.0–34.0)
MCHC: 28.6 g/dL — ABNORMAL LOW (ref 30.0–36.0)
MCV: 90.4 fL (ref 80.0–100.0)
Monocytes Absolute: 0.5 10*3/uL (ref 0.1–1.0)
Monocytes Relative: 8 %
Neutro Abs: 4.3 10*3/uL (ref 1.7–7.7)
Neutrophils Relative %: 75 %
Platelet Count: 93 10*3/uL — ABNORMAL LOW (ref 150–400)
RBC: 4.29 MIL/uL (ref 3.87–5.11)
RDW: 15.3 % (ref 11.5–15.5)
WBC Count: 5.7 10*3/uL (ref 4.0–10.5)
nRBC: 0 % (ref 0.0–0.2)

## 2023-04-11 LAB — CMP (CANCER CENTER ONLY)
ALT: 5 U/L (ref 0–44)
AST: 11 U/L — ABNORMAL LOW (ref 15–41)
Albumin: 4 g/dL (ref 3.5–5.0)
Alkaline Phosphatase: 70 U/L (ref 38–126)
Anion gap: 5 (ref 5–15)
BUN: 9 mg/dL (ref 6–20)
CO2: 30 mmol/L (ref 22–32)
Calcium: 9.5 mg/dL (ref 8.9–10.3)
Chloride: 103 mmol/L (ref 98–111)
Creatinine: 0.7 mg/dL (ref 0.44–1.00)
GFR, Estimated: >60 mL/min (ref >60)
Glucose, Bld: 85 mg/dL (ref 70–99)
Potassium: 4 mmol/L (ref 3.5–5.1)
Sodium: 138 mmol/L (ref 135–145)
Total Bilirubin: 0.6 mg/dL (ref <1.2)
Total Protein: 7.7 g/dL (ref 6.5–8.1)

## 2023-04-11 LAB — IRON AND IRON BINDING CAPACITY (CC-WL,HP ONLY)
Iron: 44 ug/dL (ref 28–170)
Saturation Ratios: 15 % (ref 10.4–31.8)
TIBC: 286 ug/dL (ref 250–450)
UIBC: 242 ug/dL (ref 148–442)

## 2023-04-11 LAB — FERRITIN: Ferritin: 137 ng/mL (ref 11–307)

## 2023-04-11 NOTE — Progress Notes (Signed)
HEMATOLOGY/ONCOLOGY CLINIC NOTE  Date of Service: 04/11/23    Patient Care Team: Marcine Matar, MD as PCP - General (Internal Medicine) Marykay Lex, MD as PCP - Cardiology (Cardiology)  CHIEF COMPLAINTS/PURPOSE OF FOLLOW UP:  Follow-up for continued management of iron deficiency anemia  HISTORY OF PRESENTING ILLNESS:  [name] is a wonderful 52 y.o. female who has been referred to [place] by Dr. Laural Benes for evaluation and management of anemia. The pt reports that she is doing well overall.   The pt reports that she has had anemia since birth but is unaware of any genetic condition causing her anemia. She received IV iron for about 4 years but stopped 2 years ago when she moved to [place]. Pt had a Port-a-Cath placed for repeat IV Iron Infusions due to issues accessing her veins.   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 She has been on anticoagulation for nearly 10 years and denies any repeat blood clots since starting Eliquis. Her PCP is currently managing her anticoagulation. 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 The ulcer tends to heal in the winter and reopen in the summer. It is currently healed with no open area.  Pt had a Colonoscopy and EGD at [facility] in [place] in August of 2021 that were benign. She was experiencing bloody stools frequently but hasn't had any after her 2021 GI studies. She has discontinued Aspirin due to concerns for excessive blood loss.   Pt has had a bilateral oophorectomies. She does not recall being diagnosed with PCOS. She denies gluten intolerance or any thyroid dysfunction. Pt had a fall in 2019 that is causing ongong back pain. She was seen after the fall and was found to have injury at L1 & L2. She has been given Tylenol for pain management. Pt has a history of chronic acid suppression. She had  stopped but recently restarted Prilosec. She was Vitamin B12 deficient for about one year. Pt had a heart attack in but denies any history of liver disease. Pt has had her COVID19 vaccines and booster.  Most recent lab results  of CBC w/diff is as follows: all values are WNL except for RBC at 3.80, Hgb at 10.1, HCT at 35.3, MCHC at 28.6, RDW at 16.2, PLT at 148K.  On review of systems, pt reports fatigue and denies nose bleeds, gum bleeds, bloody stools, heavy menstrual losses, fevers, unexpected weight loss, chills, new back pain, abdominal pain and any other symptoms.   On PMHx the pt reports B/L Oophorectomy, Myocardial infarction, DVT, Iron deficiency anemia due to chronic blood loss.  INTERVAL HISTORY   [name] is a wonderful 52 y.o. female who is here for continued valuation and management of her anemia.    She was last seen by me on 10/07/2022.   Today, she reports that some of her medications have been simplified due to concerns for dizziness. Patient is no longer on Valium, Melatonin, Paxil, or Spironolactone.  She notes having a small hematoma in her right arm.   Patient complains of rectal bleeding from hemorrhoids which has restarted. She reports that there is no blood in the stools, but does have blood when wiping. She reports that her PCP advised her to present to the ER if she endorses any heavy rectal bleeding. She has not needed to presnt to the ED. Patient  notes that her rectal bleeding previously fluctuated in severity. She continues to be on blood thinners at this time.   Patient complains of constant daily dizziness when stepping out of the shower in the morning. Her blood pressure in clinic today is low at 105/88.  She notes that she has been losing weight and her weight is 276 pounds at this time. She denies any leg swelling.   She reports that her port was removed in 2019 and there has been some difficultly with accessing her veins since. She notes that  she regularly stays hydrated. Patient reports considerations of having her port replaced.   She does take vitamin B complex. Patient also takes ferrous sulphate once a day.   Patient complains of stomach pain and back soreness between her rib cage. Her back pain is positional and worse when bending. Her back pain has been constant since her fall down the stair. Her pain has been chronic and has not changed recently. She notes that she has tried using more pillows, which has not improved symptoms. Patient reports that her PCP believed that her back pain was thought to be from hemorrhoids. Patient notes that her kidney status was normal with her last PCP visit.   She reports that she has been taking 4 tablets Advil daily for 1 week. Patient notes taking two tablets in the morning two in the evenings.   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   Morbid obesity due to excess calories (HCC)    OSA (obstructive sleep apnea) 07/11/2021   Sleep study April 06, 2019 (Dr. Craige Cotta):  Moderate OSA (AHI of 24.4 and SPO2 low of 77%).  Majority events during REM sleep.-recommend CPAP, oral appliance or surgical. Reviewed sleep hygiene.  Avoid sedatives.     Osteoarthritis of left knee    Prediabetes    Small bowel obstruction (HCC)    as a child   Speech impediment    Stutter / stammer    SURGICAL HISTORY: Past Surgical History:  Procedure Laterality Date   ABDOMINAL WALL DEFECT REPAIR  1970   IR CV LINE INJECTION  10/24/2020   IR REMOVAL TUN ACCESS W/ PORT W/O FL MOD SED  05/28/2021   IVC FILTER INSERTION  2017   Lower Extremity Venous Duplex  06/23/2020   No evidence of DVT or superficial thrombosis bilaterally.  No evidence of deep venous insufficiency bilaterally.  No evidence of SSV reflux.  Right GSV in the calf has reflux, no reflux in L GSV.;  Repeated in July  2020-no DVT   OOPHORECTOMY  1996   OOPHORECTOMY  1997   PORTACATH PLACEMENT  2014   TRANSTHORACIC ECHOCARDIOGRAM  05/18/2021   EF 60 to 65%.  No RWMA.  Mild concentric LVH.  "Normal diastolic parameters ".  Normal longitudinal strain.  Normal PAP, RAP.  Normal aortic and mitral valves.==> In July 2022, echo read as GR 1 DD otherwise stable.    SOCIAL HISTORY: Social History   Socioeconomic History   Marital status: Single    Spouse name: Not on file   Number of children: 0   Years of education: Not on file   Highest education level: Some college, no degree  Occupational History   Occupation: unemployed on disablity  Tobacco Use   Smoking status: Former    Current packs/day: 0.00    Average packs/day: 0.3 packs/day for 18.0 years (4.5 ttl pk-yrs)    Types: Cigarettes    Start date: 04/29/1984    Quit date: 04/29/2002    Years since quitting: 20.9   Smokeless tobacco: Never  Vaping Use   Vaping status: Never Used  Substance and Sexual Activity   Alcohol use: Not Currently   Drug use: Not Currently   Sexual activity: Not Currently  Other Topics Concern   Not on file  Social History  Narrative   Not on file   Social Drivers of Health   Financial Resource Strain: Low Risk  (03/06/2023)   Overall Financial Resource Strain (CARDIA)    Difficulty of Paying Living Expenses: Not hard at all  Food Insecurity: No Food Insecurity (03/06/2023)   Hunger Vital Sign    Worried About Running Out of Food in the Last Year: Never true    Ran Out of Food in the Last Year: Never true  Transportation Needs: No Transportation Needs (03/06/2023)   PRAPARE - Administrator, Civil Service (Medical): No    Lack of Transportation (Non-Medical): No  Physical Activity: Inactive (03/06/2023)   Exercise Vital Sign    Days of Exercise per Week: 0 days    Minutes of Exercise per Session: 0 min  Stress: Stress Concern Present (03/06/2023)   [employer] of Occupational Health - Occupational Stress Questionnaire    Feeling of Stress : Rather much  Social Connections: Moderately Integrated (03/06/2023)   Social Connection and Isolation Panel [NHANES]    Frequency of Communication with Friends and Family: Twice a week    Frequency of Social Gatherings with Friends and Family: Twice a week    Attends Religious Services: More than 4 times per year    Active Member of [employer] or Organizations: Yes    Attends Engineer, structural: More than 4 times per year    Marital Status: Never married  Intimate Partner Violence: Not At Risk (03/06/2023)   Humiliation, Afraid, Rape, and Kick questionnaire    Fear of Current or Ex-Partner: No    Emotionally Abused: No    Physically Abused: No    Sexually Abused: No    FAMILY HISTORY: Family History  Problem Relation Age of Onset   Diabetes Mellitus II Mother    COPD Father    Diabetes Father    Colon cancer Father    Diabetes Mellitus II Maternal Grandmother    Breast cancer Paternal Grandfather    Liver disease Neg Hx    Esophageal cancer Neg Hx     ALLERGIES:  is allergic to ace inhibitors, aspirin, hydromorphone, vancomycin,  contrast media [iodinated contrast media], dilaudid [  hydromorphone hcl], farxiga [dapagliflozin], and lidocaine.  MEDICATIONS:  Current Outpatient Medications  Medication Sig Dispense Refill   atorvastatin (LIPITOR) 40 MG tablet Take 1 tablet (40 mg total) by mouth daily. 90 tablet 1   butalbital-acetaminophen-caffeine (FIORICET) 50-325-40 MG tablet Take 1 tablet by mouth every 6 (six) hours as needed for headache. 14 tablet 0   cholecalciferol (VITAMIN D3) 25 MCG (1000 UNIT) tablet Take 1 tablet (1,000 Units total) by mouth daily. 30 tablet 5   diazepam (VALIUM) 2 MG tablet Take 1 tablet (2 mg total) by mouth every 8 (eight) hours as needed (dizziness). 15 tablet 0   docusate sodium (COLACE) 100 MG capsule Take 1 capsule (100 mg total) by mouth 2 (two) times daily as needed for mild constipation.     ELIQUIS 5 MG TABS tablet TAKE 1 TABLET BY MOUTH 2 TIMES A DAY 180 tablet 1   FEROSUL 325 (65 Fe) MG tablet TAKE ONE TABLET BY MOUTH DAILY WITH BREAKFAST 100 tablet 0   gabapentin (NEURONTIN) 400 MG capsule Take 1 capsule (400 mg total) by mouth 3 (three) times daily. 270 capsule 1   hydrOXYzine (ATARAX) 25 MG tablet Take 1 tablet (25 mg total) by mouth at bedtime as needed for itching or anxiety. 30 tablet 0   melatonin 3 MG TABS tablet Take 1 tablet (3 mg total) by mouth at bedtime. 90 tablet 1   mometasone-formoterol (DULERA) 200-5 MCG/ACT AERO INHALE 2 PUFFS EVERY MORNING AND 2 PUFFS EVERY NIGHT AT BEDTIME 13 g 11   nitroGLYCERIN (NITROSTAT) 0.4 MG SL tablet DISSOLVE 1 TABLET UNDER THE TONGUE AS NEEDED FOR CHEST PAIN EVERY 5 MINUTES UP TO 3 TIMES. IF NO RELIEF CALL 911. 25 tablet 2   oxyCODONE (OXY IR/ROXICODONE) 5 MG immediate release tablet Take 5 mg by mouth 4 (four) times daily as needed.     PARoxetine (PAXIL) 20 MG tablet Take 1 tablet (20 mg total) by mouth daily. 30 tablet 6   potassium chloride SA (KLOR-CON M) 20 MEQ tablet Take 2 tablets (40 mEq total) by mouth daily. 180 tablet 2    Semaglutide, 2 MG/DOSE, 8 MG/3ML SOPN Inject 2 mg as directed once a week. 3 mL 4   spironolactone (ALDACTONE) 25 MG tablet Take 1 tablet (25 mg total) by mouth daily. 30 tablet 0   torsemide (DEMADEX) 20 MG tablet Take 2 tablets (40 mg total) by mouth daily. 180 tablet 1   vitamin B-12 (CYANOCOBALAMIN) 1000 MCG tablet Take 1,000 mcg by mouth daily.     No current facility-administered medications for this visit.    REVIEW OF SYSTEMS:    10 Point review of Systems was done is negative except as noted above.   PHYSICAL EXAMINATION: ECOG PERFORMANCE STATUS: 2 - Symptomatic, <50% confined to bed  . Vitals:   04/11/23 1333  BP: 105/88  Pulse: 88  Resp: 18  Temp: (!) 97.2 F (36.2 C)  SpO2: 100%    Filed Weights   04/11/23 1333  Weight: 276 lb 4.8 oz (125.3 kg)    Body mass index is 40.8 kg/m.   GENERAL:alert, in no acute distress and comfortable SKIN: no acute rashes, no significant lesions EYES: conjunctiva are pink and non-injected, sclera anicteric OROPHARYNX: MMM, no exudates, no oropharyngeal erythema or ulceration NECK: supple, no JVD LYMPH:  no palpable lymphadenopathy in the cervical, axillary or inguinal regions LUNGS: clear to auscultation b/l with normal respiratory effort HEART: regular rate & rhythm ABDOMEN:  normoactive bowel sounds , non  tender, not distended. Extremity: no pedal edema PSYCH: alert & oriented x 3 with fluent speech NEURO: no focal motor/sensory deficits   LABORATORY DATA:  I have reviewed the data as listed     Latest Ref Rng & Units 04/11/2023   11:51 AM 01/12/2023    7:32 AM 01/08/2023    8:28 AM  CBC  WBC 4.0 - 10.5 K/uL 5.7  5.8  4.2   Hemoglobin 12.0 - 15.0 g/dL 16.1  9.7  9.2   Hematocrit 36.0 - 46.0 % 38.8  33.9  32.0   Platelets 150 - 400 K/uL 93  175  165    . CBC    Component Value Date/Time   WBC 5.7 04/11/2023 1151   WBC 5.8 01/12/2023 0732   RBC 4.29 04/11/2023 1151   HGB 11.1 (L) 04/11/2023 1151   HGB 12.3  01/31/2022 1341   HCT 38.8 04/11/2023 1151   HCT 38.8 01/31/2022 1341   PLT 93 (L) 04/11/2023 1151   PLT 109 (L) 01/31/2022 1341   MCV 90.4 04/11/2023 1151   MCV 87 01/31/2022 1341   MCH 25.9 (L) 04/11/2023 1151   MCHC 28.6 (L) 04/11/2023 1151   RDW 15.3 04/11/2023 1151   RDW 12.3 01/31/2022 1341   LYMPHSABS 0.7 04/11/2023 1151   LYMPHSABS 0.5 (L) 11/29/2020 1445   MONOABS 0.5 04/11/2023 1151   EOSABS 0.1 04/11/2023 1151   EOSABS 0.0 11/29/2020 1445   BASOSABS 0.0 04/11/2023 1151   BASOSABS 0.0 11/29/2020 1445    .    Latest Ref Rng & Units 04/11/2023   11:51 AM 03/13/2023    2:51 PM 02/03/2023    3:03 PM  CMP  Glucose 70 - 99 mg/dL 85  86  096   BUN 6 - 20 mg/dL 9  8  12    Creatinine 0.44 - 1.00 mg/dL 0.45  4.09  8.11   Sodium 135 - 145 mmol/L 138  143  136   Potassium 3.5 - 5.1 mmol/L 4.0  3.8  3.1   Chloride 98 - 111 mmol/L 103  103  94   CO2 22 - 32 mmol/L 30  26  31    Calcium 8.9 - 10.3 mg/dL 9.5  9.5  9.3   Total Protein 6.5 - 8.1 g/dL 7.7   7.7   Total Bilirubin <1.2 mg/dL 0.6   1.0   Alkaline Phos 38 - 126 U/L 70   75   AST 15 - 41 U/L 11   16   ALT 0 - 44 U/L 5   12    . Lab Results  Component Value Date   IRON 44 04/11/2023   TIBC 286 04/11/2023   IRONPCTSAT 15 04/11/2023   (Iron and TIBC)  Lab Results  Component Value Date   FERRITIN 137 04/11/2023    RADIOGRAPHIC STUDIES: I have personally reviewed the radiological images as listed and agreed with the findings in the report. No results found.   ASSESSMENT & PLAN:   52 y.o. with   1) Chronic iron deficiency anemia requiring recurrent IV Iron infusions. Likely from chronic GI losses and previously due to blood loss from venous stasis ulcer in the setting of chronic anticoagulation. 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 Patient Active Problem List    Diagnosis Date Noted   Peripheral edema 01/16/2023   Chronic diastolic CHF (congestive  heart failure) (HCC) 01/16/2023   Lack of motivation 01/08/2023   Anasarca 01/07/2023   Orthostatic dizziness 11/27/2022   Vertigo 11/26/2022   Type 2 diabetes mellitus with morbid obesity (HCC) 09/18/2022   Moderate major depression (HCC) 05/07/2022   Hyperlipidemia with target LDL less than 100 02/17/2022   Pain in thoracic spine 09/25/2021   Low back pain 09/25/2021   OSA (obstructive sleep apnea) 07/11/2021   Depression 05/28/2021   Morbid obesity with BMI of 50.0-59.9, adult (HCC) 05/26/2021   Bilateral lower extremity edema 11/22/2020   History of DVT (deep vein thrombosis) 11/22/2020   Asthma, chronic, moderate persistent, with acute exacerbation 11/22/2020   Lymphedema 06/09/2020   Iron deficiency anemia due to chronic blood loss 05/10/2020   Venous insufficiency 04/13/2020   S/P insertion of IVC (inferior vena caval) filter 04/13/2020   Chronic anemia 04/13/2020   Essential hypertension 04/13/2020   (HFpEF) heart failure with preserved ejection fraction (HCC) -> although echo suggests normal diastolic parameters with normal left atrial size 04/13/2020   CAD (coronary artery disease) 11/17/2019   PLAN:  -Discussed lab results on 04/11/23 in detail with patient. CBC showed WBC of 5.7K, hemoglobin of 11.1, and platelets of 93K. -her hgb was previously in the 8s three months ago while she was in the hospital with leg infection and fluid issues. Her hgb has improved since.  -WBC normal  -Platelets are mildly low at 93K currently and were previously normal in September at 175K. -possible that low platelets are caused by factors including her hemmoroidal bleeding, infection, or antibiotics. There is no increased risk of bleeding at this time. Will continue to monitor especially since she is on blood thinners. Discussed that if her platelets drop to 50K or below, there may be a role for her PCP to  consider stopping blood thinners, though this is not a concern at this time. -CMP stable -Iron saturation is lower at 15% -ferritin is 137 - no indication for IV Iron -continue oral ferrous sulfate  -continue vitamin B complex -not likely that positional back pain is related to hemorrhoids. More likely that patient has back issues.  -advised patient to avoid use of Advil. Discussed that Advil can lower platelets, increase the risk of bleeding and increase hemorroidal bleeding -if patient is needing continuous pain medications, she shall connect with her PCP to consider local patches or referral to back doctor to consider steroid injections -educated patient that her dizziness may be caused by several of her medications -advised patient to connect with her PCP and discussed dizziness symptoms -discussed that is her hemorrhoidal bleeding is significant, there may be a role for her PCP to refer her to a gastroenterologist  -educated patient that there generally is not equipment available to access a port in most clinics -patient shall receive labs with PCP in 3 months and return to clinic with [place] in 6 months for continued monitoring -answered all of patient's questions in detail  FOLLOW UP: RTC with PCP with labs in 3 months RTC with Dr Candise Che with labs in 6 months  The total time spent in the appointment was 21 minutes* .  All of the patient's questions were answered with apparent satisfaction. The patient knows to call the clinic with any problems, questions or concerns.   Wyvonnia Lora MD MS AAHIVMS [facility] Hematology/Oncology Physician [facility]  .*Total Encounter Time as defined by the Centers for Medicare and Medicaid Services includes, in addition to the face-to-face time of a  patient visit (documented in the note above) non-face-to-face time: obtaining and reviewing outside history, ordering and reviewing medications, tests or procedures, care coordination  (communications with other health care professionals or caregivers) and documentation in the medical record.    I,Mitra Faeizi,acting as a Neurosurgeon for Wyvonnia Lora, MD.,have documented all relevant documentation on the behalf of Wyvonnia Lora, MD,as directed by  Wyvonnia Lora, MD while in the presence of Wyvonnia Lora, MD.  .I have reviewed the above documentation for accuracy and completeness, and I agree with the above. Johney Maine MD

## 2023-04-14 ENCOUNTER — Telehealth: Payer: Self-pay | Admitting: Hematology

## 2023-04-14 NOTE — Telephone Encounter (Signed)
Left patient a a vm regarding upcoming appointment

## 2023-04-17 ENCOUNTER — Encounter: Payer: Self-pay | Admitting: Hematology

## 2023-05-06 ENCOUNTER — Ambulatory Visit: Payer: Self-pay

## 2023-05-06 NOTE — Telephone Encounter (Signed)
  Chief Complaint: Vaginal itching redness Symptoms: above Frequency: a few days Pertinent Negatives: Patient denies discharge Disposition: [] ED /[] Urgent Care (no appt availability in office) / [x] Appointment(In office/virtual)/ []   Virtual Care/ [] Home Care/ [] Refused Recommended Disposition /[]  Mobile Bus/ []  Follow-up with PCP Additional Notes: Pt states that she has vaginal itching and redness. PT recently had a yeast infection and did not think she could get another one so soon. Pt is unable to come into the office d/t road conditions. Pt has taken MyChart VV for the morning. Pt will try to have a yeast medication delivered tonight.    Summary: Pt insists that a nurse return her call asap   Pt requests to speak with a nurse because she needs a referral for a GYN doctor. Pt informed that the referral request will be sent to Dr. Vicci. Pt adamant about speaking with a nurse and insists that a nurse return her call asap. Cb# [phone]     Reason for Disposition  Mild vaginal itching  [1] Vaginal itching AND [2] not improved > 3 days following Care Advice  Answer Assessment - Initial Assessment Questions 1. SYMPTOM: What's the main symptom you're concerned about? (e.g., pain, itching, dryness)     Itching redness 2. LOCATION: Where is the  itching located? (e.g., inside/outside, left/right)     Vaginal area 3. ONSET: When did the  s/s  start?     A few days 4. PAIN: Is there any pain? If Yes, ask: How bad is it? (Scale: 1-10; mild, moderate, severe)   -  MILD (1-3): Doesn't interfere with normal activities.    -  MODERATE (4-7): Interferes with normal activities (e.g., work or school) or awakens from sleep.     -  SEVERE (8-10): Excruciating pain, unable to do any normal activities.     no 5. ITCHING: Is there any itching? If Yes, ask: How bad is it? (Scale: 1-10; mild, moderate, severe)     Yes - a lot 6. CAUSE: What do you think is causing  the discharge? Have you had the same problem before? What happened then?     Unsure 7. OTHER SYMPTOMS: Do you have any other symptoms? (e.g., fever, itching, vaginal bleeding, pain with urination, injury to genital area, vaginal foreign body)     no  Protocols used: Vaginal Symptoms-A-AH

## 2023-05-06 NOTE — Telephone Encounter (Signed)
 Patient called, left VM to return the call to the office to speak to the NT.   Summary: Pt insists that a nurse return her call asap   Pt requests to speak with a nurse because she needs a referral for a GYN doctor. Pt informed that the referral request will be sent to Dr. Vicci. Pt adamant about speaking with a nurse and insists that a nurse return her call asap. Cb# [phone]

## 2023-05-07 ENCOUNTER — Telehealth: Payer: Commercial Managed Care - HMO | Admitting: Physician Assistant

## 2023-05-07 ENCOUNTER — Telehealth: Payer: Self-pay | Admitting: Internal Medicine

## 2023-05-07 DIAGNOSIS — Z7985 Long-term (current) use of injectable non-insulin antidiabetic drugs: Secondary | ICD-10-CM | POA: Diagnosis not present

## 2023-05-07 DIAGNOSIS — N760 Acute vaginitis: Secondary | ICD-10-CM

## 2023-05-07 DIAGNOSIS — B379 Candidiasis, unspecified: Secondary | ICD-10-CM | POA: Diagnosis not present

## 2023-05-07 DIAGNOSIS — E1169 Type 2 diabetes mellitus with other specified complication: Secondary | ICD-10-CM | POA: Diagnosis not present

## 2023-05-07 MED ORDER — FLUCONAZOLE 150 MG PO TABS: ORAL_TABLET | ORAL | 0 refills | Status: DC

## 2023-05-07 NOTE — Telephone Encounter (Signed)
 Called but no answer. LVM to call back.

## 2023-05-07 NOTE — Telephone Encounter (Signed)
 Copied from CRM [id number]. Topic: Referral - Request for Referral >> May 06, 2023  4:58 PM Macon Large wrote: Reason for CRM: Pt requests a referral for GYN doctor

## 2023-05-07 NOTE — Progress Notes (Signed)
 Patient ID: [name], female   DOB: June 13, 1970, 53 y.o.   MRN: [id number] Virtual Visit via Video Note  I connected with [name] on 05/07/23 at  8:30 AM EST by a video enabled telemedicine application and verified that I am speaking with the correct person using two identifiers.  Location: Patient: home Provider: [facility] office   I discussed the limitations of evaluation and management by telemedicine and the availability of in person appointments. The patient expressed understanding and agreed to proceed.  History of Present Illness: patient requesting a gyn referral due to recurrent vaginal irritations.  It is of note she is diabetic and has been on antibiotics.  She says her blood sugars are running <160.  Not SA.  She is a requesting a gyn referral specifically even though I offered her to do a swab or come in person for a visit.  She declines.      Observations/Objective:  NAD.  [place]/AT   Assessment and Plan: 1. Yeast infection (Primary) - fluconazole  (DIFLUCAN ) 150 MG tablet; Take 1 weekly for 1 month  Dispense: 4 tablet; Refill: 0 - Ambulatory referral to Gynecology Get probiotics to take daily.  Drink adequate water  2. Type 2 diabetes mellitus with morbid obesity (HCC) Work at [employer] and better blood sugar control.      Follow Up Instructions: Has appt to with Dr Louder inAPril   I discussed the assessment and treatment plan with the patient. The patient was provided an opportunity to ask questions and all were answered. The patient agreed with the plan and demonstrated an understanding of the instructions.   The patient was advised to call back or seek an in-person evaluation if the symptoms worsen or if the condition fails to improve as anticipated.  I provided 12 minutes of non-face-to-face time during this encounter.   Jon Moores, PA-C

## 2023-05-07 NOTE — Telephone Encounter (Signed)
 Thanks.  I read the abstract to the article. If you did not find any cross reaction, then we will go with it. I also called and spoke with the patient.  She does not think she has taken fluconazole  before but has used miconazole vaginal creams in the past without problems.  Advised her to let us  know if she develops any hives or other reactions.

## 2023-05-07 NOTE — Telephone Encounter (Signed)
 Marchelle Folks with [facility] reports pt. Has an iodine allergy and fluconazole that was ordered today was flagged. Please advise pharmacy.

## 2023-05-07 NOTE — Telephone Encounter (Signed)
 Reason for requested referral?

## 2023-05-08 NOTE — Addendum Note (Signed)
 Addended by: [name] B on: 05/08/2023 08:55 PM   Modules accepted: Orders

## 2023-05-23 NOTE — Progress Notes (Unsigned)
Cardiology Office Note:  .   Date:  05/29/2023  ID:  [name], DOB 1971/01/11, MRN [id number] PCP: Marcine Matar, MD  [facility] Providers Cardiologist:  Bryan Lemma, MD  History of Present Illness: [name]   [name] is a 53 y.o. female female with past medical history of DVT, OSA, type 2 diabetes, asthma/COPD, morbid obesity, hypertension, HFpEF.  Patient is followed by Dr. Herbie Baltimore and presents today for follow-up appointment.   Per chart review, patient previously underwent echocardiogram in 10/2020 that showed EF 55-60%, no regional wall motion abnormalities, mild LVH, grade 1 DD, normal RV function.  It was suspected that patient had hypertensive heart disease and was treated with Lasix, spironolactone, valsartan, amlodipine, carvedilol, farxiga.  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    Patient was admitted with dizziness, vertigo in 10/2022.  During that admission, echocardiogram on 11/27/2022 showed EF 60-65%, no regional wall motion abnormalities, normal RV function.  She was discharged to SNF for rehab, and Lasix was held.  She was later seen at [facility] ED in 12/2022 for evaluation of worsening lower extremity edema, 50 pound weight gain.  Admitted for acute on chronic HFpEF.  She was diuresed and lost over 40 pounds.  Was discharged to SNF on torsemide 40 mg daily, spironolactone 25 mg daily, Farxiga 10 mg daily.   Patient was seen by the heart and vascular transition of care on 02/03/2023.  At that time, patient remained at a rehab facility.  She had been able to ambulate with a walker.  Reported occasional shortness of breath, but had improvement in lower extremity edema.  Considered NYHA class IIIb.  She remained on torsemide 40 mg daily, Farxiga 10 mg daily, spironolactone 25 mg daily.  It was noted that patient had not been compliant with her CPAP because it was at home when she was in the rehab  facility.  I saw patient in on 02/25/2023.  At that time, patient denied cardiac complaints.  Denied shortness of breath, chest pain, palpitations, swelling.  Had some weakness that was improving if she worked with PT.  Remained on Eliquis, torsemide, spironolactone, Farxiga.  BP well-controlled.  Today, patient denies cardiac concerns. Her main concern is recurrent vaginal yeast infections. She reports having had over six yeast infections in the past year, which she finds concerning. She is unsure of the cause of these recurrent infections but mentions that they have been suggested to be related to her diabetes. Her last A1c was 5.4, and her home blood sugar levels range between 182 and 185. Her spironolactone and farxiga have both been stopped, and she does note improvement in yeast infections since they were stopped. She mentions a brief period of nausea and feeling woozy when she started taking a pill for her yeast infections. However, these symptoms have since improved, and she is now on preventative therapies, including a cream and a pill taken monthly.  Patient denies chest pain, shortness of breath, ankle swelling, palpitations, dizziness, syncope, near syncope. She is working to lose weight, but does admit to some dietary indiscretions, admitting to eating cheesecake brought by her brother for her upcoming birthday. She lives with her mother and does a lot of home cooking, using Mrs. Dash instead of salt due to her blood pressure.  ROS: Denies chest pain, palpitations, shortness of breath, ankle swelling, syncope, near syncope, dizziness.   Studies Reviewed: .   Cardiac  Studies & Procedures      ECHOCARDIOGRAM  ECHOCARDIOGRAM COMPLETE 11/27/2022  Narrative ECHOCARDIOGRAM REPORT    Patient Name:   [name] Date of Exam: 11/27/2022 Medical Rec #:  [id number]               Height:       63.0 in Accession #:    [id number]              Weight:       278.0 lb Date of Birth:   July 16, 1970                BSA:          2.224 m Patient Age:    52 years                BP:           87/64 mmHg Patient Gender: F                       HR:           104 bpm. Exam Location:  Inpatient  Procedure: 2D Echo, Cardiac Doppler, Color Doppler and Intracardiac Opacification Agent  Indications:    R55 Syncope  History:        Patient has prior history of Echocardiogram examinations, most recent 05/26/2021. CAD, COPD; Risk Factors:Diabetes and Hypertension. HFpEF.  Sonographer:    Harriette Bouillon RDCS Referring Phys: [phone] ANKIT CHIRAG AMIN  IMPRESSIONS   1. Left ventricular ejection fraction, by estimation, is 60 to 65%. The left ventricle has normal function. The left ventricle has no regional wall motion abnormalities. Left ventricular diastolic parameters are indeterminate. 2. Right ventricular systolic function is normal. The right ventricular size is normal. 3. The mitral valve is normal in structure. No evidence of mitral valve regurgitation. No evidence of mitral stenosis. 4. The aortic valve is normal in structure. Aortic valve regurgitation is not visualized. No aortic stenosis is present. 5. The inferior vena cava is normal in size with greater than 50% respiratory variability, suggesting right atrial pressure of 3 mmHg.  Comparison(s): Prior images reviewed side by side.  FINDINGS Left Ventricle: Left ventricular ejection fraction, by estimation, is 60 to 65%. The left ventricle has normal function. The left ventricle has no regional wall motion abnormalities. The left ventricular internal cavity size was normal in size. There is no left ventricular hypertrophy. Left ventricular diastolic parameters are indeterminate.  Right Ventricle: The right ventricular size is normal. No increase in right ventricular wall thickness. Right ventricular systolic function is normal.  Left Atrium: Left atrial size was normal in size.  Right Atrium: Right atrial size was normal in  size.  Pericardium: There is no evidence of pericardial effusion.  Mitral Valve: The mitral valve is normal in structure. No evidence of mitral valve regurgitation. No evidence of mitral valve stenosis.  Tricuspid Valve: The tricuspid valve is normal in structure. Tricuspid valve regurgitation is not demonstrated. No evidence of tricuspid stenosis.  Aortic Valve: The aortic valve is normal in structure. Aortic valve regurgitation is not visualized. No aortic stenosis is present.  Pulmonic Valve: The pulmonic valve was normal in structure. Pulmonic valve regurgitation is not visualized. No evidence of pulmonic stenosis.  Aorta: The aortic root is normal in size and structure.  Venous: The inferior vena cava is normal in size with greater than 50% respiratory variability, suggesting right atrial pressure of 3 mmHg.  IAS/Shunts: No atrial level  shunt detected by color flow Doppler.   LEFT VENTRICLE PLAX 2D LVIDd:         4.70 cm LVIDs:         3.40 cm LV PW:         1.10 cm LV IVS:        1.10 cm LVOT diam:     2.10 cm LV SV:         45 LV SV Index:   20 LVOT Area:     3.46 cm   LEFT ATRIUM           Index LA diam:      3.30 cm 1.48 cm/m LA Vol (A4C): 38.7 ml 17.40 ml/m AORTIC VALVE LVOT Vmax:   76.40 cm/s LVOT Vmean:  58.200 cm/s LVOT VTI:    0.131 m  AORTA Ao Root diam: 2.90 cm Ao Asc diam:  3.00 cm   SHUNTS Systemic VTI:  0.13 m Systemic Diam: 2.10 cm  Donato Schultz MD Electronically signed by Donato Schultz MD Signature Date/Time: 11/27/2022/2:22:18 PM    Final             Risk Assessment/Calculations:             Physical Exam:   VS:  BP 106/80 (BP Location: Left Arm, Patient Position: Sitting, Cuff Size: Large)   Pulse 90   Ht 5\' 9"  (1.753 m)   Wt 278 lb 3.2 oz (126.2 kg)   SpO2 96%   BMI 41.08 kg/m    Wt Readings from Last 3 Encounters:  05/29/23 278 lb 3.2 oz (126.2 kg)  04/11/23 276 lb 4.8 oz (125.3 kg)  03/06/23 287 lb (130.2 kg)    GEN:  Well nourished, well developed in no acute distress. Sitting comfortably in the chair  NECK: No JVD CARDIAC: RRR, no murmurs, rubs, gallops RESPIRATORY:  Clear to auscultation without rales, wheezing or rhonchi. Normal WOB on room air  ABDOMEN: Soft, non-tender, non-distended EXTREMITIES:  No edema in BLE; No deformity   ASSESSMENT AND PLAN: .    Chronic HFpEF  Venous Insufficiency  - Most recent echocardiogram from 10/2022 showed EF 60-65%, no regional wall motion abnormalities, normal RV function - Currently on torsemide 40 mg daily and K supplementation due to history of hypokalemia  - Previously on farxiga but had yeast infections so it was stopped. Spironolactone was also recently stopped due to yeast infections. Since stopping these medications, reports improvement - Today, patient is euvolemic on exam. She denies shortness of breath, orthopnea, or ankle edema  - Creatinine 0.70 and K 4.0 on 04/11/23    Morbid Obesity  OSA Suspected OHS  - Patient reports compliance with CPAP  - BMP 41 - On ozempic per PCP  - Discussed weight loss diet and increasing physical activity as tolerated    HTN  - Continue torsemide 40 mg daily  - BP well controlled. Denies symptoms of orthostatic hypotension     HLD  - Lipid panel from 08/2022 showed LDL 66, HDL 49, triglycerides 70, total cholesterol 129 - Continue lipitor 40 mg daily    History of DVT  - Continue eliquis 5 mg BID - Denies bleeding or bruising on eliquis   Yeast Infections  - Patient reports having recurrent yeast infections throughout the past year. Spironolactone and farxiga have been stopped. Reports some improvement in symptoms since the medications were stopped  - Seeing OB-GYN next month for further evaluation   Dispo: Follow up in 5-6 months  with Dr. Herbie Baltimore   Signed, Jonita Albee, PA-C

## 2023-05-29 ENCOUNTER — Ambulatory Visit: Payer: Commercial Managed Care - HMO | Attending: Cardiology | Admitting: Cardiology

## 2023-05-29 ENCOUNTER — Encounter: Payer: Self-pay | Admitting: Cardiology

## 2023-05-29 VITALS — BP 106/80 | HR 90 | Ht 69.0 in | Wt 278.2 lb

## 2023-05-29 DIAGNOSIS — E785 Hyperlipidemia, unspecified: Secondary | ICD-10-CM | POA: Diagnosis not present

## 2023-05-29 DIAGNOSIS — G4733 Obstructive sleep apnea (adult) (pediatric): Secondary | ICD-10-CM

## 2023-05-29 DIAGNOSIS — I10 Essential (primary) hypertension: Secondary | ICD-10-CM

## 2023-05-29 DIAGNOSIS — I5032 Chronic diastolic (congestive) heart failure: Secondary | ICD-10-CM

## 2023-05-29 DIAGNOSIS — B3731 Acute candidiasis of vulva and vagina: Secondary | ICD-10-CM

## 2023-05-29 DIAGNOSIS — Z86718 Personal history of other venous thrombosis and embolism: Secondary | ICD-10-CM

## 2023-05-29 NOTE — Patient Instructions (Addendum)
Medication Instructions:  No changes *If you need a refill on your cardiac medications before your next appointment, please call your pharmacy*  Lab Work: No labs  Testing/Procedures: No testing  Follow-Up: At [facility], you and your health needs are our priority.  As part of our continuing mission to provide you with exceptional heart care, we have created designated Provider Care Teams.  These Care Teams include your primary Cardiologist (physician) and Advanced Practice Providers (APPs -  Physician Assistants and Nurse Practitioners) who all work together to provide you with the care you need, when you need it.  We recommend signing up for the patient portal called "MyChart".  Sign up information is provided on this After Visit Summary.  MyChart is used to connect with patients for Virtual Visits (Telemedicine).  Patients are able to view lab/test results, encounter notes, upcoming appointments, etc.  Non-urgent messages can be sent to your provider as well.   To learn more about what you can do with MyChart, go to [email or web address].    Your next appointment:   5 month(s)  Provider:   Bryan Lemma, MD     Other Instructions

## 2023-06-10 ENCOUNTER — Telehealth: Payer: Self-pay

## 2023-06-10 NOTE — Telephone Encounter (Signed)
Noted! Thank you

## 2023-06-10 NOTE — Telephone Encounter (Signed)
Copied from CRM [id number]. Topic: General - Other >> Jun 10, 2023 10:12 AM Higinio Roger wrote: Reason for CRM: Patient wanted to let Dr. Laural Benes know her insurance is no longer accepted at the previous pharmacy and they are now being sent to [facility] [id number] - [place],  - [address]. I have also updated the preferred pharmacy to that location in patient's demographics.

## 2023-06-24 ENCOUNTER — Encounter: Payer: Self-pay | Admitting: Obstetrics & Gynecology

## 2023-06-24 ENCOUNTER — Other Ambulatory Visit (HOSPITAL_COMMUNITY)
Admission: RE | Admit: 2023-06-24 | Discharge: 2023-06-24 | Disposition: A | Discharge status: Home / Self Care | Source: Ambulatory Visit | Attending: Obstetrics & Gynecology | Admitting: Obstetrics & Gynecology

## 2023-06-24 ENCOUNTER — Ambulatory Visit (INDEPENDENT_AMBULATORY_CARE_PROVIDER_SITE_OTHER): Payer: Commercial Managed Care - HMO | Admitting: Advanced Practice Midwife

## 2023-06-24 VITALS — BP 150/90 | HR 92 | Wt 281.5 lb

## 2023-06-24 DIAGNOSIS — R102 Pelvic and perineal pain: Secondary | ICD-10-CM | POA: Diagnosis not present

## 2023-06-24 DIAGNOSIS — Z124 Encounter for screening for malignant neoplasm of cervix: Secondary | ICD-10-CM | POA: Insufficient documentation

## 2023-06-24 DIAGNOSIS — B3731 Acute candidiasis of vulva and vagina: Secondary | ICD-10-CM

## 2023-06-24 DIAGNOSIS — K648 Other hemorrhoids: Secondary | ICD-10-CM | POA: Diagnosis not present

## 2023-06-24 DIAGNOSIS — G8929 Other chronic pain: Secondary | ICD-10-CM | POA: Diagnosis not present

## 2023-06-24 DIAGNOSIS — Z01419 Encounter for gynecological examination (general) (routine) without abnormal findings: Secondary | ICD-10-CM | POA: Diagnosis not present

## 2023-06-24 DIAGNOSIS — Z8719 Personal history of other diseases of the digestive system: Secondary | ICD-10-CM

## 2023-06-24 DIAGNOSIS — R109 Unspecified abdominal pain: Secondary | ICD-10-CM | POA: Diagnosis not present

## 2023-06-24 MED ORDER — TERCONAZOLE 0.4 % VA CREA: 1.0000 | TOPICAL_CREAM | Freq: Every day | VAGINAL | 0 refills | Status: DC

## 2023-06-24 NOTE — Progress Notes (Signed)
 Subjective:     [name] is a 53 y.o. female here at [facility] for a routine exam.  Current complaints: hemorrhoids with bleeding with bowel movements and pain in the mid abdomen and low abdomen, mostly with bowel movements.  Personal and family health history reviewed: yes.  Do you have a primary care provider? yes Do you feel safe at home? yes  Flowsheet Row Office Visit from 06/24/2023 in [facility] for [facility] Total Score 0       Health Maintenance Due  Topic Date Due   FOOT EXAM  Never done   Hepatitis C Screening  Never done   COVID-19 Vaccine (2 - Pfizer risk series) 07/13/2021   Diabetic kidney evaluation - Urine ACR  05/08/2023   OPHTHALMOLOGY EXAM  06/22/2023     Risk factors for chronic health problems: Smoking: Alchohol/how much: Pt BMI: Body mass index is 41.57 kg/m.   Gynecologic History No LMP recorded. Patient is postmenopausal. Contraception: post menopausal status Last Pap: 2022. Results were: normal Last mammogram: 11/04/22. Results were: normal  Obstetric History OB History  Gravida Para Term Preterm AB Living  0 0 0 0 0 0  SAB IAB Ectopic Multiple Live Births  0 0 0 0 0     The following portions of the patient's history were reviewed and updated as appropriate: allergies, current medications, past family history, past medical history, past social history, past surgical history, and problem list.  Review of Systems Pertinent items noted in HPI and remainder of comprehensive ROS otherwise negative.    Objective:  BP (!) 150/90   Pulse 92   Wt 281 lb 8 oz (127.7 kg)   BMI 41.57 kg/m   VS reviewed, nursing note reviewed,  Constitutional: well developed, well nourished, no distress HEENT: normocephalic, thyroid without enlargement or mass HEART: RRR, no murmurs rubs/gallops RESP: clear and equal to auscultation bilaterally in all lobes  Breast Exam:  Deferred. Done at PCP along with  mammogram within the last year.  Encouraged self exams.  Abdomen: soft Neuro: alert and oriented x 3 Skin: warm, dry Psych: affect normal Pelvic exam:Performed: Cervix pink, visually closed, without lesion, scant white curd-like discharge, vaginal walls and external genitalia normal Bimanual exam: Cervix 0/long/high, firm, anterior, neg CMT, uterus nontender, nonenlarged, adnexa without tenderness, enlargement, or mass        Assessment/Plan:   1. Encounter for annual routine gynecological examination   2. Chronic pelvic pain in female (Primary) --Pain is mid abdomen, above pelvic area but has had workup by GI and PCP without findings. Pt pain is with bowel movements.  No vaginal bleeding or other gyn symptoms. --Will rule out pelvic causes for pain - US PELVIC COMPLETE WITH TRANSVAGINAL; Future  3. Encounter for screening for cervical cancer  - Cytology - PAP( [place])  4. Abdominal pain in female patient --chronic pain, near umbilicus, shallow near skin surface --No edema or erythema, no warmth or other s/sx of cellulitis or other infection --Outpatient pelvic US to rule out Gyn causes for pain --Pt to f/u with PCP and GI if pain persists  5. History of small bowel obstruction --Surgery as a child, no surgeries since then  6. Internal hemorrhoid, bleeding --No visible hemorrhoid on exam, visibility difficult with body habitus, no bleeding on today's exam but pt report of bright red bleeding with bowel movements.    7. Vaginal candidiasis --Recurrent, pt taking maintenance therapy with Diflucan monthly,  weekly topical medication from PCP --Visible evidence of yeast on today's exam.   --Treat with 7 day course, then resume maintenance therapy - terconazole (TERAZOL 7) 0.4 % vaginal cream; Place 1 applicator vaginally at bedtime.  Dispense: 45 g; Refill: 0    Return in about 1 year (around 06/23/2024) for annual exam, schedule outpatient [place].   Sharen Counter,  CNM 12:24 PM

## 2023-06-24 NOTE — Progress Notes (Signed)
 Pt presents for new GYN visit for AEX. Pt complaints of hemorrhoids that are hurting her abdomen, suppositories have somewhat helped. Pap smear was last in 2022 and pt reports that she has had some abnormal. Pt does not have ovaries she states. Pt had mammogram Nov 2024, normal. Pt has had colonoscopy.

## 2023-06-30 ENCOUNTER — Ambulatory Visit (HOSPITAL_COMMUNITY)
Admission: RE | Admit: 2023-06-30 | Discharge: 2023-06-30 | Disposition: A | Discharge status: Home / Self Care | Payer: Commercial Managed Care - HMO | Source: Ambulatory Visit | Attending: Advanced Practice Midwife | Admitting: Advanced Practice Midwife

## 2023-06-30 ENCOUNTER — Other Ambulatory Visit: Payer: Self-pay | Admitting: Advanced Practice Midwife

## 2023-06-30 DIAGNOSIS — Z8719 Personal history of other diseases of the digestive system: Secondary | ICD-10-CM

## 2023-06-30 DIAGNOSIS — Z01419 Encounter for gynecological examination (general) (routine) without abnormal findings: Secondary | ICD-10-CM

## 2023-06-30 DIAGNOSIS — K648 Other hemorrhoids: Secondary | ICD-10-CM

## 2023-06-30 DIAGNOSIS — R102 Pelvic and perineal pain: Secondary | ICD-10-CM | POA: Diagnosis present

## 2023-06-30 DIAGNOSIS — G8929 Other chronic pain: Secondary | ICD-10-CM | POA: Insufficient documentation

## 2023-06-30 DIAGNOSIS — R109 Unspecified abdominal pain: Secondary | ICD-10-CM

## 2023-06-30 DIAGNOSIS — Z124 Encounter for screening for malignant neoplasm of cervix: Secondary | ICD-10-CM

## 2023-06-30 DIAGNOSIS — B3731 Acute candidiasis of vulva and vagina: Secondary | ICD-10-CM

## 2023-06-30 LAB — CYTOLOGY - PAP
Comment: NEGATIVE
Diagnosis: NEGATIVE
High risk HPV: NEGATIVE

## 2023-07-10 ENCOUNTER — Encounter (HOSPITAL_COMMUNITY): Payer: Self-pay

## 2023-07-10 ENCOUNTER — Emergency Department (HOSPITAL_COMMUNITY)

## 2023-07-10 ENCOUNTER — Emergency Department (HOSPITAL_COMMUNITY)
Admission: EM | Admit: 2023-07-10 | Discharge: 2023-07-10 | Disposition: A | Discharge status: Home / Self Care | Attending: Emergency Medicine | Admitting: Emergency Medicine

## 2023-07-10 ENCOUNTER — Other Ambulatory Visit: Payer: Self-pay

## 2023-07-10 ENCOUNTER — Ambulatory Visit: Payer: Managed Care, Other (non HMO) | Admitting: Internal Medicine

## 2023-07-10 DIAGNOSIS — S301XXA Contusion of abdominal wall, initial encounter: Secondary | ICD-10-CM | POA: Diagnosis not present

## 2023-07-10 DIAGNOSIS — R1084 Generalized abdominal pain: Secondary | ICD-10-CM | POA: Diagnosis not present

## 2023-07-10 DIAGNOSIS — W01198A Fall on same level from slipping, tripping and stumbling with subsequent striking against other object, initial encounter: Secondary | ICD-10-CM | POA: Diagnosis not present

## 2023-07-10 DIAGNOSIS — I10 Essential (primary) hypertension: Secondary | ICD-10-CM | POA: Diagnosis not present

## 2023-07-10 DIAGNOSIS — S0990XA Unspecified injury of head, initial encounter: Secondary | ICD-10-CM | POA: Insufficient documentation

## 2023-07-10 DIAGNOSIS — Z7901 Long term (current) use of anticoagulants: Secondary | ICD-10-CM | POA: Insufficient documentation

## 2023-07-10 DIAGNOSIS — Z79899 Other long term (current) drug therapy: Secondary | ICD-10-CM | POA: Insufficient documentation

## 2023-07-10 DIAGNOSIS — R109 Unspecified abdominal pain: Secondary | ICD-10-CM | POA: Diagnosis present

## 2023-07-10 DIAGNOSIS — W19XXXA Unspecified fall, initial encounter: Secondary | ICD-10-CM | POA: Diagnosis not present

## 2023-07-10 LAB — CBC WITH DIFFERENTIAL/PLATELET
Abs Immature Granulocytes: 0.02 10*3/uL (ref 0.00–0.07)
Basophils Absolute: 0 10*3/uL (ref 0.0–0.1)
Basophils Relative: 0 %
Eosinophils Absolute: 0 10*3/uL (ref 0.0–0.5)
Eosinophils Relative: 1 %
HCT: 33.4 % — ABNORMAL LOW (ref 36.0–46.0)
Hemoglobin: 10 g/dL — ABNORMAL LOW (ref 12.0–15.0)
Immature Granulocytes: 1 %
Lymphocytes Relative: 13 %
Lymphs Abs: 0.5 10*3/uL — ABNORMAL LOW (ref 0.7–4.0)
MCH: 27.5 pg (ref 26.0–34.0)
MCHC: 29.9 g/dL — ABNORMAL LOW (ref 30.0–36.0)
MCV: 91.8 fL (ref 80.0–100.0)
Monocytes Absolute: 0.4 10*3/uL (ref 0.1–1.0)
Monocytes Relative: 9 %
Neutro Abs: 2.9 10*3/uL (ref 1.7–7.7)
Neutrophils Relative %: 76 %
Platelets: 137 10*3/uL — ABNORMAL LOW (ref 150–400)
RBC: 3.64 MIL/uL — ABNORMAL LOW (ref 3.87–5.11)
RDW: 14.4 % (ref 11.5–15.5)
WBC: 3.9 10*3/uL — ABNORMAL LOW (ref 4.0–10.5)
nRBC: 0 % (ref 0.0–0.2)

## 2023-07-10 LAB — COMPREHENSIVE METABOLIC PANEL
ALT: 10 U/L (ref 0–44)
AST: 15 U/L (ref 15–41)
Albumin: 3.3 g/dL — ABNORMAL LOW (ref 3.5–5.0)
Alkaline Phosphatase: 58 U/L (ref 38–126)
Anion gap: 7 (ref 5–15)
BUN: 8 mg/dL (ref 6–20)
CO2: 27 mmol/L (ref 22–32)
Calcium: 9.2 mg/dL (ref 8.9–10.3)
Chloride: 104 mmol/L (ref 98–111)
Creatinine, Ser: 0.72 mg/dL (ref 0.44–1.00)
GFR, Estimated: >60 mL/min (ref >60)
Glucose, Bld: 95 mg/dL (ref 70–99)
Potassium: 4 mmol/L (ref 3.5–5.1)
Sodium: 138 mmol/L (ref 135–145)
Total Bilirubin: 1.1 mg/dL (ref 0.0–1.2)
Total Protein: 6.9 g/dL (ref 6.5–8.1)

## 2023-07-10 MED ORDER — IOHEXOL 350 MG/ML SOLN
75.0000 mL | Freq: Once | INTRAVENOUS | Status: AC | PRN
  Administered 2023-07-10: 75 mL via INTRAVENOUS

## 2023-07-10 MED ORDER — MORPHINE SULFATE (PF) 4 MG/ML IV SOLN
4.0000 mg | Freq: Once | INTRAVENOUS | Status: AC
  Administered 2023-07-10: 4 mg via INTRAVENOUS
  Filled 2023-07-10: qty 1

## 2023-07-10 MED ORDER — METHYLPREDNISOLONE SODIUM SUCC 40 MG IJ SOLR
40.0000 mg | Freq: Once | INTRAMUSCULAR | Status: AC
  Administered 2023-07-10: 40 mg via INTRAVENOUS
  Filled 2023-07-10: qty 1

## 2023-07-10 MED ORDER — DIPHENHYDRAMINE HCL 50 MG/ML IJ SOLN
50.0000 mg | Freq: Once | INTRAMUSCULAR | Status: AC
  Administered 2023-07-10: 50 mg via INTRAVENOUS
  Filled 2023-07-10: qty 1

## 2023-07-10 MED ORDER — DIPHENHYDRAMINE HCL 25 MG PO CAPS: 50.0000 mg | ORAL_CAPSULE | Freq: Once | ORAL | Status: AC

## 2023-07-10 NOTE — ED Notes (Signed)
 Patient verbalizes understanding of discharge instructions. Opportunity for questioning and answers were provided. Armband removed by staff, pt discharged from ED. Wheeled out to lobby, given cab voucher

## 2023-07-10 NOTE — ED Triage Notes (Signed)
 Pt arrived via [facility], pt was inside of [employer] and tripped on thee carpet and fell face forward. Pt landed on the carpet. Pt did hit head. Pt does not have any obvious head injuries. Pt denies LOC. Pt c/o mid abdominal pain. Pt is on eliquis.

## 2023-07-10 NOTE — Progress Notes (Signed)
 RN called to main lobby of [facility] for patient fall. RN notified at 1113 by [facility] front desk.   Upon initial assessment, patient is sitting on lobby floor with her back propped up against a chair. She is alert and oriented x 4. No bleeding or visible injuries.   States she tripped on the rubber trim of rug at entrance. Denies any dizziness before or after the fall. Describes falling "face first," but denies hitting her head. Denies LOC. Fall witnessed by valet staff who report patient did not hit her head, but that she landed more so on her abdomen.   Patient endorses significant abdominal pain and requests transport to the emergency department. EMS called at 1117 and arrived at 1129 to assume care.   Patient maintained stable vitals while waiting for EMS. RN remained with patient until EMS assumed care.   Sandie Ano, RN

## 2023-07-10 NOTE — ED Provider Notes (Signed)
 [place] EMERGENCY DEPARTMENT AT [facility] Provider Note   CSN: [id number] Arrival date & time: 07/10/23  1158     History  Chief Complaint  Patient presents with   [name] is a 53 y.o. female.  This is a 53 year old female who is here today after [name] had a nonsyncopal fall from standing when [name] was going to her doctor's office.  Patient has a history of DVT, is on Eliquis.  Says that [name] tripped fell forward and hit her head.  No LOC.  Patient is also endorsing abdominal pain.  Says that [name] did strike her abdomen.   Fall       Home Medications Prior to Admission medications   Medication Sig Start Date End Date Taking? Authorizing Provider  amLODipine (NORVASC) 5 MG tablet Take 2.5 mg by mouth daily.   Yes [provider]  atorvastatin (LIPITOR) 40 MG tablet Take 40 mg by mouth daily.   Yes [provider]  cholecalciferol (VITAMIN D3) 25 MCG (1000 UNIT) tablet Take 1 tablet (1,000 Units total) by mouth daily. 04/07/20  Yes Marcine Matar, MD  diazepam (VALIUM) 2 MG tablet Take 1 tablet (2 mg total) by mouth every 8 (eight) hours as needed (dizziness). Patient taking differently: Take 2 mg by mouth daily. 01/18/23  Yes Barnetta Chapel, MD  docusate sodium (COLACE) 100 MG capsule Take 1 capsule (100 mg total) by mouth 2 (two) times daily as needed for mild constipation. 11/30/22  Yes Amin, Ankit C, MD  ELIQUIS 5 MG TABS tablet TAKE 1 TABLET BY MOUTH 2 TIMES A DAY 03/20/23  Yes Marcine Matar, MD  FEROSUL 325 (65 Fe) MG tablet TAKE ONE TABLET BY MOUTH DAILY WITH BREAKFAST 06/06/21  Yes Marcine Matar, MD  fluconazole (DIFLUCAN) 150 MG tablet Take 1 weekly for 1 month Patient taking differently: Take 150 mg by mouth once a week. Take one tablet a week for four weeks. 05/07/23  Yes Anders Simmonds, PA-C  gabapentin (NEURONTIN) 400 MG capsule Take 1 capsule (400 mg total) by mouth 3 (three) times daily. 03/06/23   Yes Marcine Matar, MD  mometasone-formoterol (DULERA) 200-5 MCG/ACT AERO INHALE 2 PUFFS EVERY MORNING AND 2 PUFFS EVERY NIGHT AT BEDTIME Patient taking differently: Inhale 2 puffs into the lungs daily as needed for wheezing or shortness of breath. INHALE 2 PUFFS EVERY MORNING AND 2 PUFFS EVERY NIGHT AT BEDTIME 03/06/23  Yes Marcine Matar, MD  nitroGLYCERIN (NITROSTAT) 0.4 MG SL tablet DISSOLVE 1 TABLET UNDER THE TONGUE AS NEEDED FOR CHEST PAIN EVERY 5 MINUTES UP TO 3 TIMES. IF NO RELIEF CALL 911. Patient taking differently: Place 0.4 mg under the tongue every 5 (five) minutes as needed for chest pain. 03/17/23  Yes Marcine Matar, MD  PARoxetine (PAXIL) 20 MG tablet Take 1 tablet (20 mg total) by mouth daily. 03/06/23  Yes Marcine Matar, MD  potassium chloride SA (KLOR-CON M) 20 MEQ tablet Take 2 tablets (40 mEq total) by mouth daily. 03/06/23  Yes Marcine Matar, MD  Semaglutide, 2 MG/DOSE, 8 MG/3ML SOPN Inject 2 mg as directed once a week. Patient taking differently: Inject 2 mg as directed once a week. Wednesday 03/06/23  Yes Marcine Matar, MD  terconazole (TERAZOL 7) 0.4 % vaginal cream Place 1 applicator vaginally at bedtime. 06/24/23  Yes Leftwich-Kirby, Wilmer Floor, CNM  torsemide (DEMADEX) 20 MG tablet Take 2 tablets (40 mg total) by  mouth daily. 03/06/23 07/10/23 Yes Marcine Matar, MD  triamcinolone cream (KENALOG) 0.1 % Apply 1 Application topically daily as needed (skin irritation). 02/08/23  Yes [provider]  vitamin B-12 (CYANOCOBALAMIN) 1000 MCG tablet Take 1,000 mcg by mouth daily.   Yes [provider]  spironolactone (ALDACTONE) 25 MG tablet Take 1 tablet (25 mg total) by mouth daily. 03/06/23 04/05/23  Marcine Matar, MD      Allergies    Ace inhibitors, Hydromorphone, Vancomycin, Contrast media [iodinated contrast media], Dilaudid [hydromorphone hcl], Farxiga [dapagliflozin], and Lidocaine    Review of Systems   Review of  Systems  Physical Exam Updated Vital Signs BP (!) 141/89   Pulse 71   Temp (!) 97.1 F (36.2 C) (Temporal)   Resp 13   Ht 5\' 9"  (1.753 m)   Wt 127.7 kg   SpO2 98%   BMI 41.57 kg/m  Physical Exam Vitals reviewed.  HENT:     Head: Normocephalic and atraumatic.  Cardiovascular:     Rate and Rhythm: Normal rate.  Pulmonary:     Effort: Pulmonary effort is normal.  Abdominal:     General: Abdomen is flat.     Palpations: Abdomen is soft.     Tenderness: There is abdominal tenderness.  Musculoskeletal:        General: Normal range of motion.  Skin:    General: Skin is warm.  Neurological:     General: No focal deficit present.     Mental Status: [name] is alert. Mental status is at baseline.     ED Results / Procedures / Treatments   Labs (all labs ordered are listed, but only abnormal results are displayed) Labs Reviewed  COMPREHENSIVE METABOLIC PANEL - Abnormal; Notable for the following components:      Result Value   Albumin 3.3 (*)    All other components within normal limits  CBC WITH DIFFERENTIAL/PLATELET - Abnormal; Notable for the following components:   WBC 3.9 (*)    RBC 3.64 (*)    Hemoglobin 10.0 (*)    HCT 33.4 (*)    MCHC 29.9 (*)    Platelets 137 (*)    Lymphs Abs 0.5 (*)    All other components within normal limits    EKG EKG Interpretation Date/Time:  Thursday July 10 2023 12:04:21 EDT Ventricular Rate:  77 PR Interval:  175 QRS Duration:  108 QT Interval:  427 QTC Calculation: 484 R Axis:   1  Text Interpretation: Sinus rhythm Lateral infarct, age indeterminate Confirmed by Anders Simmonds [phone]) on 07/10/2023 1:59:31 PM  Radiology No results found.  Procedures Procedures    Medications Ordered in ED Medications  diphenhydrAMINE (BENADRYL) capsule 50 mg (has no administration in time range)    Or  diphenhydrAMINE (BENADRYL) injection 50 mg (has no administration in time range)  methylPREDNISolone sodium succinate (SOLU-MEDROL)  40 mg/mL injection 40 mg (40 mg Intravenous Given 07/10/23 1230)    ED Course/ Medical Decision Making/ A&P                                 Medical Decision Making 53 year old female here today after a nonsyncopal fall from standing.  Differential diagnoses include head contusion, intracranial hemorrhage, intra-abdominal injury, intra-abdominal hemorrhage, abdominal contusion.  Plan-on exam, patient overall looks well.  [name] has normal vital signs.  With her being on Eliquis, will obtain imaging of the patient's head, as well  as abdomen and pelvis.  Unfortunately, patient is a contrast dye allergy, so have begun to pretreat the patient.  Patient's hemoglobin 10, is within her usual range.  My independent review of the patient's EKG shows no ST segment depressions or elevations, no T wave inversions, no evidence of acute ischemia.  Patient was signed out to Dr. Fredderick Phenix pending CT imaging and disposition.  Amount and/or Complexity of Data Reviewed Labs: ordered. Radiology: ordered.  Risk Prescription drug management.           Final Clinical Impression(s) / ED Diagnoses Final diagnoses:  Fall, initial encounter    Rx / DC Orders ED Discharge Orders     None         Anders Simmonds T, DO 07/10/23 1400

## 2023-07-10 NOTE — ED Provider Notes (Addendum)
 Patient care was taken over from Dr. Andria Meuse.  Patient was awaiting CT scan imaging after she had a mechanical fall.  She had a head CT which did not show any acute abnormality.  CT of her abdomen pelvis did not show any acute traumatic injuries.  There was some dilated loops of bowel.  She does not have any nausea vomiting or significant ongoing abdominal tenderness that would be more concerning for obstruction.  There was some dilatation of the common bile duct but she does not have any pain over this area.  Her LFTs are normal.  She is otherwise well-appearing with stable vital signs.  She was discharged home in good condition.  She was encouraged to follow-up with her primary care doctor if she has ongoing pain.  Return precautions were given.   Rolan Bucco, MD 07/10/23 2127    Rolan Bucco, MD 07/10/23 2128

## 2023-07-12 ENCOUNTER — Encounter: Payer: Self-pay | Admitting: Advanced Practice Midwife

## 2023-07-14 ENCOUNTER — Ambulatory Visit: Payer: Self-pay | Admitting: Internal Medicine

## 2023-07-14 NOTE — Telephone Encounter (Signed)
 Copied from CRM [id number]. Topic: Clinical - Red Word Triage >> Jul 14, 2023  9:20 AM Clayton Bibles wrote: Red Word that prompted transfer to Nurse Triage: [name] fell on the carpet going to her appointment on March 13. She was sent to emergency department. She admitted to hospital. She fell on her left side and has a bruise. She is wanting to made a ER follow up appointment  Chief Complaint: Fall Symptoms: Pain, bruising Frequency: Last Thursday Pertinent Negatives: Patient denies dizziness and weakness Disposition: [] ED /[] Urgent Care (no appt availability in office) / [] Appointment(In office/virtual)/ []  [facility]/ [] Home Care/ [] Refused Recommended Disposition /[] [facility]/ []  Follow-up with PCP Additional Notes: Patient called in to report a fall that happened on 07/10/23. Patient was hospitalized and discharged on 07/11/23. Patient stated the fall happened when she was walking through the front doors of the office building, while on the way to her appointment. Patient stated she landed on her left side. Patient stated her left side is still in pain. Patient stated the left side is bruised. Patient was not able to give an estimate of the size or description of the bruise. Patient needs a HFU. No availability with PCP until May. This RN called [facility] and spoke to [place]. This RN received approval to schedule HFU with an alternate provider. This RN scheduled patient with an alternate provider in the office for next week. This RN advised patient to call back if symptoms worsen. Patient complied.   Reason for Disposition  [1] Fall AND [2] went to emergency department for evaluation or treatment  Answer Assessment - Initial Assessment Questions 1. MECHANISM: "How did the fall happen?"     Walking through the front door of the office for the appointment  3. ONSET: "When did the fall happen?" (e.g., minutes, hours, or days ago)     07/10/23 walking through the front door of the office  for the appointment  4. LOCATION: "What part of the body hit the ground?" (e.g., back, buttocks, head, hips, knees, hands, head, stomach)     Left side 5. INJURY: "Did you hurt (injure) yourself when you fell?" If Yes, ask: "What did you injure? Tell me more about this?" (e.g., body area; type of injury; pain severity)"     Left side of back 6. PAIN: "Is there any pain?" If Yes, ask: "How bad is the pain?" (e.g., Scale 1-10; or mild,  moderate, severe)   - NONE (0): No pain   - MILD (1-3): Doesn't interfere with normal activities    - MODERATE (4-7): Interferes with normal activities or awakens from sleep    - SEVERE (8-10): Excruciating pain, unable to do any normal activities      Rates pain a 9 at this time 7. SIZE: For cuts, bruises, or swelling, ask: "How large is it?" (e.g., inches or centimeters)      States left side is bruised, states she is unable to see it 9. OTHER SYMPTOMS: "Do you have any other symptoms?" (e.g., dizziness, fever, weakness; new onset or worsening).      Denies dizziness, denies weakness 10. CAUSE: "What do you think caused the fall (or falling)?" (e.g., tripped, dizzy spell)     States she tripped over the rug  Protocols used: Falls and [facility]

## 2023-07-15 ENCOUNTER — Telehealth: Payer: Self-pay | Admitting: Internal Medicine

## 2023-07-15 NOTE — Telephone Encounter (Addendum)
 error

## 2023-07-16 ENCOUNTER — Telehealth: Payer: Self-pay

## 2023-07-16 NOTE — Telephone Encounter (Signed)
 S/w pt and advised of u/s results and provider recommendations for diet changes and follow up with PCP and GI, pt agreed.

## 2023-07-16 NOTE — Telephone Encounter (Signed)
 Attempted to contact to discuss u/s results, no answer, left vm

## 2023-07-18 ENCOUNTER — Telehealth: Payer: Self-pay | Admitting: Internal Medicine

## 2023-07-18 NOTE — Telephone Encounter (Addendum)
 FYI!  Patient called on 07/18/2023. Patient called to report a fall that occurred on July 10, 2023, before coming in for her appointment on July 10, 2023 at 2:10 pm with Dr. Laural Benes. She stated that she slipped on the carpet while entering the main building and landed on her hip, resulting in a bruise. She was hospitalized on July 10, 2023. She requested to speak with the practice manager, Al Decant, regarding legal action and involving a lawyer and her family. I informed her that the manager is gone for the day. She acknowledged this and stated that she has an appointment on March 27,2025 at 9:50 am with Georgian Co PA, and will discuss the matter with the manager after her appointment.

## 2023-07-20 ENCOUNTER — Other Ambulatory Visit: Payer: Self-pay | Admitting: Internal Medicine

## 2023-07-23 NOTE — Progress Notes (Unsigned)
 Patient ID: [name], female   DOB: 1970-06-19, 53 y.o.   MRN: [id number]  After 07/10/2023 after sustaining a mechanical fall.    Plan-on exam, patient overall looks well.  She has normal vital signs.  With her being on Eliquis, will obtain imaging of the patient's head, as well as abdomen and pelvis.  Unfortunately, patient is a contrast dye allergy, so have begun to pretreat the patient.   Patient's hemoglobin 10, is within her usual range.   My independent review of the patient's EKG shows no ST segment depressions or elevations, no T wave inversions, no evidence of acute ischemia.   Patient was signed out to Dr. Fredderick Phenix pending CT imaging and disposition.  Patient care was taken over from Dr. Andria Meuse. Patient was awaiting CT scan imaging after she had a mechanical fall. She had a head CT which did not show any acute abnormality. CT of her abdomen pelvis did not show any acute traumatic injuries. There was some dilated loops of bowel. She does not have any nausea vomiting or significant ongoing abdominal tenderness that would be more concerning for obstruction. There was some dilatation of the common bile duct but she does not have any pain over this area. Her LFTs are normal. She is otherwise well-appearing with stable vital signs. She was discharged home in good condition. She was encouraged to follow-up with her primary care doctor if she has ongoing pain. Return precautions were given

## 2023-07-24 ENCOUNTER — Encounter: Payer: Self-pay | Admitting: Physician Assistant

## 2023-07-24 ENCOUNTER — Ambulatory Visit (HOSPITAL_BASED_OUTPATIENT_CLINIC_OR_DEPARTMENT_OTHER)
Admission: RE | Admit: 2023-07-24 | Discharge: 2023-07-24 | Disposition: A | Discharge status: Home / Self Care | Source: Ambulatory Visit | Attending: Physician Assistant | Admitting: Physician Assistant

## 2023-07-24 ENCOUNTER — Ambulatory Visit: Payer: Self-pay | Attending: Physician Assistant | Admitting: Physician Assistant

## 2023-07-24 VITALS — BP 134/86 | HR 80 | Ht 69.0 in | Wt 273.2 lb

## 2023-07-24 DIAGNOSIS — S20212S Contusion of left front wall of thorax, sequela: Secondary | ICD-10-CM | POA: Insufficient documentation

## 2023-07-24 DIAGNOSIS — Z09 Encounter for follow-up examination after completed treatment for conditions other than malignant neoplasm: Secondary | ICD-10-CM

## 2023-07-24 DIAGNOSIS — S20212D Contusion of left front wall of thorax, subsequent encounter: Secondary | ICD-10-CM

## 2023-07-24 MED ORDER — METHOCARBAMOL 500 MG PO TABS: 1000.0000 mg | ORAL_TABLET | Freq: Three times a day (TID) | ORAL | 0 refills | Status: DC | PRN

## 2023-07-28 ENCOUNTER — Emergency Department (HOSPITAL_BASED_OUTPATIENT_CLINIC_OR_DEPARTMENT_OTHER)
Admission: EM | Admit: 2023-07-28 | Discharge: 2023-07-28 | Disposition: A | Discharge status: Home / Self Care | Attending: Emergency Medicine | Admitting: Emergency Medicine

## 2023-07-28 ENCOUNTER — Emergency Department (HOSPITAL_BASED_OUTPATIENT_CLINIC_OR_DEPARTMENT_OTHER)

## 2023-07-28 ENCOUNTER — Encounter (HOSPITAL_BASED_OUTPATIENT_CLINIC_OR_DEPARTMENT_OTHER): Payer: Self-pay

## 2023-07-28 ENCOUNTER — Other Ambulatory Visit: Payer: Self-pay

## 2023-07-28 ENCOUNTER — Ambulatory Visit: Payer: Self-pay | Admitting: *Deleted

## 2023-07-28 DIAGNOSIS — Z7901 Long term (current) use of anticoagulants: Secondary | ICD-10-CM | POA: Insufficient documentation

## 2023-07-28 DIAGNOSIS — S2242XA Multiple fractures of ribs, left side, initial encounter for closed fracture: Secondary | ICD-10-CM | POA: Insufficient documentation

## 2023-07-28 DIAGNOSIS — R109 Unspecified abdominal pain: Secondary | ICD-10-CM | POA: Diagnosis not present

## 2023-07-28 DIAGNOSIS — W1839XA Other fall on same level, initial encounter: Secondary | ICD-10-CM | POA: Insufficient documentation

## 2023-07-28 DIAGNOSIS — S29001A Unspecified injury of muscle and tendon of front wall of thorax, initial encounter: Secondary | ICD-10-CM | POA: Diagnosis present

## 2023-07-28 MED ORDER — OXYCODONE HCL 5 MG PO TABS: 5.0000 mg | ORAL_TABLET | Freq: Four times a day (QID) | ORAL | 0 refills | Status: DC | PRN

## 2023-07-28 MED ORDER — OXYCODONE HCL 5 MG PO TABS
5.0000 mg | ORAL_TABLET | Freq: Once | ORAL | Status: AC
  Administered 2023-07-28: 5 mg via ORAL
  Filled 2023-07-28: qty 1

## 2023-07-28 NOTE — ED Provider Notes (Signed)
  EMERGENCY DEPARTMENT AT [facility] Provider Note   CSN: [id number] Arrival date & time: 07/28/23  1321     History  No chief complaint on file.   [name] is a 53 y.o. female.  Patient presents to the emergency department today for evaluation of ongoing left flank pain that started after a fall on 07/10/2023.  Patient was seen in the emergency department at that time and had a CT of the abdomen pelvis.  Patient is on Eliquis for history of DVT.  Patient reports severe pain which is not improving.  She has taken over-the-counter medications without improvement.  She states that it causes her difficulty breathing and she has difficulty doing ADLs such as putting on her close.  She is having difficulty sleeping at night because of the pain.  Patient is on gabapentin chronically.  She is allergic to lidocaine.       Home Medications Prior to Admission medications   Medication Sig Start Date End Date Taking? Authorizing Provider  amLODipine (NORVASC) 5 MG tablet Take 2.5 mg by mouth daily.    [provider]  atorvastatin (LIPITOR) 40 MG tablet Take 40 mg by mouth daily.    [provider]  cholecalciferol (VITAMIN D3) 25 MCG (1000 UNIT) tablet Take 1 tablet (1,000 Units total) by mouth daily. 04/07/20   Marcine Matar, MD  diazepam (VALIUM) 2 MG tablet Take 1 tablet (2 mg total) by mouth every 8 (eight) hours as needed (dizziness). Patient taking differently: Take 2 mg by mouth daily. 01/18/23   Barnetta Chapel, MD  docusate sodium (COLACE) 100 MG capsule Take 1 capsule (100 mg total) by mouth 2 (two) times daily as needed for mild constipation. 11/30/22   Miguel Rota, MD  ELIQUIS 5 MG TABS tablet TAKE 1 TABLET BY MOUTH 2 TIMES A DAY 03/20/23   Marcine Matar, MD  FEROSUL 325 (65 Fe) MG tablet TAKE ONE TABLET BY MOUTH DAILY WITH BREAKFAST 06/06/21   Marcine Matar, MD  fluconazole (DIFLUCAN) 150 MG tablet Take 1 weekly for  1 month Patient taking differently: Take 150 mg by mouth once a week. Take one tablet a week for four weeks. 05/07/23   Anders Simmonds, PA-C  gabapentin (NEURONTIN) 400 MG capsule Take 1 capsule (400 mg total) by mouth 3 (three) times daily. 03/06/23   Marcine Matar, MD  methocarbamol (ROBAXIN) 500 MG tablet Take 2 tablets (1,000 mg total) by mouth every 8 (eight) hours as needed. 07/24/23   Anders Simmonds, PA-C  mometasone-formoterol (DULERA) 200-5 MCG/ACT AERO INHALE 2 PUFFS EVERY MORNING AND 2 PUFFS EVERY NIGHT AT BEDTIME Patient taking differently: Inhale 2 puffs into the lungs daily as needed for wheezing or shortness of breath. INHALE 2 PUFFS EVERY MORNING AND 2 PUFFS EVERY NIGHT AT BEDTIME 03/06/23   Marcine Matar, MD  nitroGLYCERIN (NITROSTAT) 0.4 MG SL tablet DISSOLVE 1 TABLET UNDER THE TONGUE AS NEEDED FOR CHEST PAIN EVERY 5 MINUTES UP TO 3 TIMES. IF NO RELIEF CALL 911. Patient taking differently: Place 0.4 mg under the tongue every 5 (five) minutes as needed for chest pain. 03/17/23   Marcine Matar, MD  PARoxetine (PAXIL) 20 MG tablet Take 1 tablet (20 mg total) by mouth daily. 03/06/23   Marcine Matar, MD  potassium chloride SA (KLOR-CON M) 20 MEQ tablet Take 2 tablets (40 mEq total) by mouth daily. 03/06/23   Marcine Matar, MD  Semaglutide, 2  MG/DOSE, (OZEMPIC, 2 MG/DOSE,) 8 MG/3ML SOPN DIAL AND INJECT UNDER THE SKIN 2 MG WEEKLY 07/21/23   Marcine Matar, MD  spironolactone (ALDACTONE) 25 MG tablet Take 1 tablet (25 mg total) by mouth daily. 03/06/23 04/05/23  Marcine Matar, MD  terconazole (TERAZOL 7) 0.4 % vaginal cream Place 1 applicator vaginally at bedtime. 06/24/23   Leftwich-Kirby, Wilmer Floor, CNM  torsemide (DEMADEX) 20 MG tablet Take 2 tablets (40 mg total) by mouth daily. 03/06/23 07/10/23  Marcine Matar, MD  triamcinolone cream (KENALOG) 0.1 % Apply 1 Application topically daily as needed (skin irritation). 02/08/23   [provider]   vitamin B-12 (CYANOCOBALAMIN) 1000 MCG tablet Take 1,000 mcg by mouth daily.    [provider]      Allergies    Ace inhibitors, Hydromorphone, Vancomycin, Contrast media [iodinated contrast media], Dilaudid [hydromorphone hcl], Farxiga [dapagliflozin], and Lidocaine    Review of Systems   Review of Systems  Physical Exam Updated Vital Signs BP (!) 148/75 (BP Location: Right Arm)   Pulse (!) 104   Temp 98.7 F (37.1 C)   Resp 16   SpO2 100%  Physical Exam Vitals and nursing note reviewed.  Constitutional:      Appearance: She is well-developed.  HENT:     Head: Normocephalic and atraumatic.  Eyes:     Conjunctiva/sclera: Conjunctivae normal.  Cardiovascular:     Rate and Rhythm: Normal rate.  Pulmonary:     Effort: No respiratory distress.     Breath sounds: No wheezing, rhonchi or rales.  Chest:     Chest wall: Tenderness present.       Comments: Patient winces when I palpate in the area of the left lateral and anterior inferior ribs.  No deformities palpated.  No overlying ecchymosis. Musculoskeletal:     Cervical back: Normal range of motion and neck supple.  Skin:    General: Skin is warm and dry.  Neurological:     Mental Status: She is alert.     ED Results / Procedures / Treatments   Labs (all labs ordered are listed, but only abnormal results are displayed) Labs Reviewed - No data to display  EKG None  Radiology CT Chest Wo Contrast Result Date: 07/28/2023 CLINICAL DATA:  Fall, chest trauma, left rib pain EXAM: CT CHEST WITHOUT CONTRAST TECHNIQUE: Multidetector CT imaging of the chest was performed following the standard protocol without IV contrast. The patient was scanned prone. RADIATION DOSE REDUCTION: This exam was performed according to the departmental dose-optimization program which includes automated exposure control, adjustment of the mA and/or kV according to patient size and/or use of iterative reconstruction technique. 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 Atheromatous vascular calcification of the thoracic aorta and left anterior descending coronary artery with moderate cardiomegaly. Mediastinum/Nodes: Unremarkable Lungs/Pleura: No pneumothorax or pulmonary contusion. Subsegmental atelectasis in the lingula. Upper Abdomen: Collateral venous vasculature in the left, posterior, and right posterior subcutaneous tissues of the upper abdomen. 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 Nondisplaced fractures of the left anterior seventh, eighth, and ninth ribs. Thoracic spondylosis.  Lower cervical spondylosis. IMPRESSION: 1. Nondisplaced fractures of the left anterior seventh, eighth, and ninth ribs. No pneumothorax or pulmonary contusion. 2. Moderate cardiomegaly. 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 3. Aortic and coronary artery atherosclerosis.  Aortic Atherosclerosis (ICD10-I70.0). 4. 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 5. Thoracic and lower cervical spondylosis. Electronically Signed   By: Gaylyn Rong M.D.   On: 07/28/2023 15:51    Procedures Procedures    Medications Ordered in ED Medications - No data to display  ED Course/ Medical Decision Making/ A&P    Patient seen and examined. History obtained directly from patient.   Labs/EKG: None ordered  Imaging: Ordered CT chest without contrast.  Medications/Fluids: None ordered  Most recent vital signs reviewed and are as follows: BP (!) 148/75 (BP Location: Right Arm)   Pulse (!) 104   Temp 98.7 F (37.1 C)   Resp 16   SpO2 100%   Initial impression: Patient reports non-improving left lateral rib pain after a fall.  It is causing her a lot of difficulties in daily life.   She is also on anticoagulation.  She had plain films several days ago.  Will evaluate for occult rib fracture and ensure no underlying pulmonary injury with CT.  4:27 PM Reassessment performed. Patient appears comfortable, sitting in wheelchair.  Imaging personally visualized and interpreted including: Agree rib fractures as noted, no underlying pulmonary contusion or pneumothorax.  Reviewed pertinent lab work and imaging with patient at bedside. Questions answered.   Most current vital signs reviewed and are as follows: BP (!) 148/75 (BP Location: Right Arm)   Pulse (!) 104   Temp 98.7 F (37.1 C)   Resp 16   SpO2 100%   Plan: Discharge to home.   Prescriptions written for: Oxycodone # 10 tablets.  Patient counseled on use of narcotic pain medications. Counseled not to combine these medications with others containing tylenol. Urged not to drink alcohol, drive, or perform any other activities that requires focus while taking these medications. The patient verbalizes understanding and agrees with the plan.  Other home care instructions discussed: Use of OTC meds, previously prescribed methocarbamol by PCP on 3/28  ED return instructions discussed: Difficulty breathing, new or worsening symptoms  Follow-up instructions discussed: Patient encouraged to follow-up with their PCP in 7 days.                                 Medical Decision Making Amount and/or Complexity of Data Reviewed Radiology: ordered.  Risk Prescription drug management.   Patient with rib fractures on CT, after fall a couple of weeks ago.  No signs of pneumothorax or underlying pulmonary contusion.  Patient's pain is likely related to these healing rib fractures.  Patient given oxycodone for pain control.  She was prescribed methocarbamol few days ago.  Encouraged PCP follow-up to ensure improvement.        Final Clinical Impression(s) / ED Diagnoses Final diagnoses:  Closed fracture of multiple ribs of  left side, initial encounter    Rx / DC Orders ED Discharge Orders          Ordered    oxyCODONE (OXY IR/ROXICODONE) 5 MG immediate release tablet  Every 6 hours PRN        07/28/23 1625              Renne Crigler, PA-C 07/28/23 1630    Long, Arlyss Repress, MD 08/01/23 [phone]

## 2023-07-28 NOTE — ED Triage Notes (Signed)
 Pt c/o fall 3/13, c/o continued L sided rib pain. Hx contusion to same from fall, advises that pain has not decreased or subsided  Tylenol no relief.

## 2023-07-28 NOTE — ED Notes (Signed)

## 2023-07-28 NOTE — Telephone Encounter (Signed)
 Pain related to injury- SOB  Chief Complaint: trouble/pain with breathing due to recent fall- rib contusion  Symptoms: Patient states she has SOB- pain with breathing, unable to bend, move, lift arm- fell on left side Frequency: fell 07/10/23 Pertinent Negatives: Patient denies dizziness, runny nose, cough, chest pain, fever Disposition: [] ED /[] Urgent Care (no appt availability in office) / [] Appointment(In office/virtual)/ []  [facility]/ [] Home Care/ [x] Refused Recommended Disposition /[] [facility]/ []  Follow-up with PCP Additional Notes: Patient wants to be seen at [facility]- it is close to where she lives. Declines appointment at alternative location.    Copied from CRM [id number]. Topic: Clinical - Red Word Triage >> Jul 28, 2023  1:05 PM Everette C wrote: [facility] that prompted transfer to Nurse Triage: The patient shares that they fell on 3/05/02/23 and have continued to experience difficulty breathing Reason for Disposition . [1] Chest wall swelling or pain AND [2] present > 7 days  Answer Assessment - Initial Assessment Questions 1. RESPIRATORY STATUS: "Describe your breathing?" (e.g., wheezing, shortness of breath, unable to speak, severe coughing)      SOB- hard to breath, hard to bend /lift arms- pain 2. ONSET: "When did this breathing problem begin?"      3/13 patient fell on left side of rib- soreness- hard to breath 3. PATTERN "Does the difficult breathing come and go, or has it been constant since it started?"      Due to pain- hard to cough,sneeze 4. SEVERITY: "How bad is your breathing?" (e.g., mild, moderate, severe)    - MILD: No SOB at rest, mild SOB with walking, speaks normally in sentences, can lie down, no retractions, pulse < 100.    - MODERATE: SOB at rest, SOB with minimal exertion and prefers to sit, cannot lie down flat, speaks in phrases, mild retractions, audible wheezing, pulse 100-120.    - SEVERE: Very SOB at rest, speaks in single  words, struggling to breathe, sitting hunched forward, retractions, pulse > 120      9/10 5. RECURRENT SYMPTOM: "Have you had difficulty breathing before?" If Yes, ask: "When was the last time?" and "What happened that time?"      No- injury 6. CARDIAC HISTORY: "Do you have any history of heart disease?" (e.g., heart attack, angina, bypass surgery, angioplasty)      Heart failure 7. LUNG HISTORY: "Do you have any history of lung disease?"  (e.g., pulmonary embolus, asthma, emphysema)     COPD 8. CAUSE: "What do you think is causing the breathing problem?"      injury 9. OTHER SYMPTOMS: "Do you have any other symptoms? (e.g., dizziness, runny nose, cough, chest pain, fever)     no  Answer Assessment - Initial Assessment Questions 1. MECHANISM: "How did the injury happen?"     Patient fell outside office- rib contusion- left 2. ONSET: "When did the injury happen?" (Minutes or hours ago)     07/10/23 3. LOCATION: "Where on the chest is the injury located?"     Left side  6. SEVERITY: "Any difficulty with breathing?"     Patient states she has had continued pain with breathing- causing her to have SOB  8. PAIN: "Is there pain?" If Yes, ask: "How bad is the pain?"   (e.g., Scale 1-10; or mild, moderate, severe)     9/10  Protocols used: Breathing Difficulty-A-AH, Chest Injury-A-AH

## 2023-07-28 NOTE — ED Notes (Signed)
 Educated pt on use of incentive spirometer.

## 2023-07-28 NOTE — Discharge Instructions (Addendum)
 Please read and follow all provided instructions.  Your diagnoses today include:  1. Closed fracture of multiple ribs of left side, initial encounter     Tests performed today include: CT scan of your chest shows 3 rib fractures, no injury to the lungs Vital signs. See below for your results today.   Medications prescribed:  Oxycodone - narcotic pain medication  DO NOT drive or perform any activities that require you to be awake and alert because this medicine can make you drowsy.   Take any prescribed medications only as directed.  Home care instructions:  Follow any educational materials contained in this packet.  Follow-up instructions: Please follow-up with your primary care provider in the next 7 days for further evaluation of your symptoms.   Return instructions:  Please return to the Emergency Department if you experience worsening symptoms.  Please return if you have any other emergent concerns.  Additional Information:  Your vital signs today were: BP (!) 148/75 (BP Location: Right Arm)   Pulse (!) 104   Temp 98.7 F (37.1 C)   Resp 16   SpO2 100%  If your blood pressure (BP) was elevated above 135/85 this visit, please have this repeated by your doctor within one month. --------------

## 2023-07-30 ENCOUNTER — Encounter: Payer: Self-pay | Admitting: Physician Assistant
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
## 2023-08-06 ENCOUNTER — Encounter: Payer: Self-pay | Admitting: Hematology

## 2023-08-07 ENCOUNTER — Inpatient Hospital Stay: Admitting: Physician Assistant

## 2023-08-07 ENCOUNTER — Encounter: Payer: Self-pay | Admitting: Internal Medicine

## 2023-08-07 ENCOUNTER — Ambulatory Visit: Attending: Internal Medicine | Admitting: Internal Medicine

## 2023-08-07 DIAGNOSIS — S2242XD Multiple fractures of ribs, left side, subsequent encounter for fracture with routine healing: Secondary | ICD-10-CM | POA: Diagnosis not present

## 2023-08-07 DIAGNOSIS — F330 Major depressive disorder, recurrent, mild: Secondary | ICD-10-CM

## 2023-08-07 DIAGNOSIS — J4540 Moderate persistent asthma, uncomplicated: Secondary | ICD-10-CM

## 2023-08-07 DIAGNOSIS — I10 Essential (primary) hypertension: Secondary | ICD-10-CM

## 2023-08-07 DIAGNOSIS — D500 Iron deficiency anemia secondary to blood loss (chronic): Secondary | ICD-10-CM

## 2023-08-07 DIAGNOSIS — Z6841 Body Mass Index (BMI) 40.0 and over, adult: Secondary | ICD-10-CM | POA: Diagnosis not present

## 2023-08-07 DIAGNOSIS — E785 Hyperlipidemia, unspecified: Secondary | ICD-10-CM

## 2023-08-07 DIAGNOSIS — E119 Type 2 diabetes mellitus without complications: Secondary | ICD-10-CM | POA: Diagnosis not present

## 2023-08-07 DIAGNOSIS — E1169 Type 2 diabetes mellitus with other specified complication: Secondary | ICD-10-CM | POA: Diagnosis not present

## 2023-08-07 DIAGNOSIS — Z7985 Long-term (current) use of injectable non-insulin antidiabetic drugs: Secondary | ICD-10-CM

## 2023-08-07 DIAGNOSIS — E1159 Type 2 diabetes mellitus with other circulatory complications: Secondary | ICD-10-CM

## 2023-08-07 DIAGNOSIS — I2510 Atherosclerotic heart disease of native coronary artery without angina pectoris: Secondary | ICD-10-CM | POA: Diagnosis not present

## 2023-08-07 DIAGNOSIS — Z86718 Personal history of other venous thrombosis and embolism: Secondary | ICD-10-CM | POA: Diagnosis not present

## 2023-08-07 LAB — POCT GLYCOSYLATED HEMOGLOBIN (HGB A1C): HbA1c, POC (controlled diabetic range): 5.3 % (ref 0.0–7.0)

## 2023-08-07 LAB — GLUCOSE, POCT (MANUAL RESULT ENTRY): POC Glucose: 100 mg/dL — AB (ref 70–99)

## 2023-08-07 MED ORDER — PAROXETINE HCL 20 MG PO TABS: 20.0000 mg | ORAL_TABLET | Freq: Every day | ORAL | 6 refills | Status: DC

## 2023-08-07 MED ORDER — VITAMIN D 25 MCG (1000 UNIT) PO TABS: 1000.0000 [IU] | ORAL_TABLET | Freq: Every day | ORAL | 5 refills | Status: AC

## 2023-08-07 MED ORDER — ATORVASTATIN CALCIUM 40 MG PO TABS: 40.0000 mg | ORAL_TABLET | Freq: Every day | ORAL | 1 refills | Status: DC

## 2023-08-07 MED ORDER — APIXABAN 5 MG PO TABS: 5.0000 mg | ORAL_TABLET | Freq: Two times a day (BID) | ORAL | 1 refills | Status: DC

## 2023-08-07 MED ORDER — OXYCODONE HCL 5 MG PO TABS: 5.0000 mg | ORAL_TABLET | Freq: Two times a day (BID) | ORAL | 0 refills | Status: DC | PRN

## 2023-08-07 MED ORDER — DOCUSATE SODIUM 100 MG PO CAPS: 100.0000 mg | ORAL_CAPSULE | Freq: Two times a day (BID) | ORAL | Status: AC | PRN

## 2023-08-07 MED ORDER — GABAPENTIN 400 MG PO CAPS: 400.0000 mg | ORAL_CAPSULE | Freq: Three times a day (TID) | ORAL | 1 refills | Status: DC

## 2023-08-07 MED ORDER — DULERA 200-5 MCG/ACT IN AERO: INHALATION_SPRAY | RESPIRATORY_TRACT | 11 refills | Status: AC

## 2023-08-07 NOTE — Progress Notes (Signed)
 Patient ID: [name], female    DOB: 05-02-1970  MRN: [id number]  CC: Hospitalization Follow-up (Hsopitalization f/u. /Rib pain - )   Subjective: Damyah Gugel is a 53 y.o. female who presents for chronic ds management. 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  EGD and colonoscopy negative, chronic ulcer left lower extremity,    Discussed the use of AI scribe software for clinical note transcription with the patient, who gave verbal consent to proceed.  History of Present Illness   Miss Nichols, a patient with a history of diabetes, hypertension, hyperlipidemia, and congestive heart failure, presents for follow-up after a recent fall. She tripped over a rug and fell face forward, subsequently turning over and injuring her ribs. She visited the emergency room twice after the fall, with CT scans revealing fractures of the seventh, eighth, and ninth ribs on the left side on last visit 07/28/2023. Despite being prescribed oxycodone for pain management, she reports persistent pain, particularly when bending over, putting her clothes on or tying her shoes.  In terms of her chronic conditions, her diabetes is well-controlled with Ozempic, with a recent A1c of 5.3. She reports a decrease in appetite and has lost 11 pounds since her last visit in November. She is also engaging in regular exercise, walking for 30 minutes twice a week.   Her hypertension, CAD and congestive heart failure are managed with torsemide, atorvastatin and spironolactone, and she reports adherence to a low-salt diet.  She is not sure whether she is taking the amlodipine 2.5 mg.  She is on potassium supplement 20 mEq 2 tabs daily.  She denies any  recent episodes of shortness of breath, chest pain, or leg swelling.  She also has a history of anemia and is currently taking oral iron supplements. She is followed by a hematologist, Dr. Candise Che, who last saw her in December. At that time, her hemoglobin was 11, but it has since dropped to 10. She denies any recent episodes of dizziness.  She is on Eliquis for history of recurrent DVT.    Depression: Reports she is doing well on Paxil.  Request refills.  Asthma: Doing well on Dulera. Patient Active Problem List   Diagnosis Date Noted   Abdominal pain in female patient 06/24/2023   Peripheral edema 01/16/2023   Chronic diastolic CHF (congestive heart failure) (HCC) 01/16/2023   Lack of motivation 01/08/2023   Anasarca 01/07/2023   Orthostatic dizziness 11/27/2022   Vertigo 11/26/2022   Type 2 diabetes mellitus with morbid obesity (HCC) 09/18/2022   Moderate major depression (HCC) 05/07/2022   Hyperlipidemia with target LDL less than 100 02/17/2022   Pain in thoracic spine 09/25/2021   Low back pain 09/25/2021   OSA (obstructive sleep apnea) 07/11/2021   Depression 05/28/2021   Morbid obesity with BMI of 50.0-59.9, adult (HCC) 05/26/2021   Bilateral lower extremity edema 11/22/2020   History of DVT (deep vein thrombosis) 11/22/2020   Asthma, chronic, moderate persistent, with acute exacerbation 11/22/2020   Lymphedema 06/09/2020   Iron deficiency anemia due to chronic blood loss 05/10/2020   Venous insufficiency 04/13/2020   S/P insertion of IVC (inferior vena caval) filter 04/13/2020   Chronic anemia 04/13/2020   Essential hypertension 04/13/2020   (HFpEF) heart  failure with preserved ejection fraction (HCC) -> although echo suggests normal diastolic parameters with normal left atrial size 04/13/2020   CAD (coronary artery disease) 11/17/2019     Current Outpatient Medications on File Prior to Visit  Medication Sig Dispense Refill   amLODipine (NORVASC) 5 MG tablet Take 2.5 mg by  mouth daily.     cholecalciferol (VITAMIN D3) 25 MCG (1000 UNIT) tablet Take 1 tablet (1,000 Units total) by mouth daily. 30 tablet 5   docusate sodium (COLACE) 100 MG capsule Take 1 capsule (100 mg total) by mouth 2 (two) times daily as needed for mild constipation.     ELIQUIS 5 MG TABS tablet TAKE 1 TABLET BY MOUTH 2 TIMES A DAY 180 tablet 1   gabapentin (NEURONTIN) 400 MG capsule Take 1 capsule (400 mg total) by mouth 3 (three) times daily. 270 capsule 1   methocarbamol (ROBAXIN) 500 MG tablet Take 2 tablets (1,000 mg total) by mouth every 8 (eight) hours as needed. 90 tablet 0   mometasone-formoterol (DULERA) 200-5 MCG/ACT AERO INHALE 2 PUFFS EVERY MORNING AND 2 PUFFS EVERY NIGHT AT BEDTIME (Patient taking differently: Inhale 2 puffs into the lungs daily as needed for wheezing or shortness of breath. INHALE 2 PUFFS EVERY MORNING AND 2 PUFFS EVERY NIGHT AT BEDTIME) 13 g 11   nitroGLYCERIN (NITROSTAT) 0.4 MG SL tablet DISSOLVE 1 TABLET UNDER THE TONGUE AS NEEDED FOR CHEST PAIN EVERY 5 MINUTES UP TO 3 TIMES. IF NO RELIEF CALL 911. (Patient taking differently: Place 0.4 mg under the tongue every 5 (five) minutes as needed for chest pain.) 25 tablet 2   oxyCODONE (OXY IR/ROXICODONE) 5 MG immediate release tablet Take 1 tablet (5 mg total) by mouth every 6 (six) hours as needed for severe pain (pain score 7-10). 10 tablet 0   PARoxetine (PAXIL) 20 MG tablet Take 1 tablet (20 mg total) by mouth daily. 30 tablet 6   potassium chloride SA (KLOR-CON M) 20 MEQ tablet Take 2 tablets (40 mEq total) by mouth daily. 180 tablet 2   Semaglutide, 2 MG/DOSE, (OZEMPIC, 2 MG/DOSE,) 8 MG/3ML SOPN DIAL AND INJECT UNDER THE SKIN 2 MG WEEKLY 3 mL 0   triamcinolone cream (KENALOG) 0.1 % Apply 1 Application topically daily as needed (skin irritation).     vitamin B-12 (CYANOCOBALAMIN) 1000 MCG tablet Take 1,000 mcg by mouth daily.     atorvastatin (LIPITOR) 40 MG tablet Take 40 mg by mouth daily. (Patient not taking:  Reported on 08/07/2023)     diazepam (VALIUM) 2 MG tablet Take 1 tablet (2 mg total) by mouth every 8 (eight) hours as needed (dizziness). (Patient not taking: Reported on 08/07/2023) 15 tablet 0   FEROSUL 325 (65 Fe) MG tablet TAKE ONE TABLET BY MOUTH DAILY WITH BREAKFAST (Patient not taking: Reported on 08/07/2023) 100 tablet 0   fluconazole (DIFLUCAN) 150 MG tablet Take 1 weekly for 1 month (Patient not taking: Reported on 08/07/2023) 4 tablet 0   spironolactone (ALDACTONE) 25 MG tablet Take 1 tablet (25 mg total) by mouth daily. 30 tablet 0   terconazole (TERAZOL 7) 0.4 % vaginal cream Place 1 applicator vaginally at bedtime. (Patient not taking: Reported on 08/07/2023) 45 g 0   torsemide (DEMADEX) 20 MG tablet Take 2 tablets (40 mg total) by mouth daily. (Patient not taking: Reported on 08/07/2023) 180 tablet 1   No current facility-administered medications on file prior to visit.    Allergies  Allergen Reactions   Ace Inhibitors Rash  Make pt bleed   Hydromorphone Hives and Itching   Vancomycin Itching and Rash   Contrast Media [Iodinated Contrast Media] Hives   Dilaudid [Hydromorphone Hcl] Hives   Farxiga [Dapagliflozin] Other (See Comments)    Vaginal yeast   Lidocaine Itching    Itching with Lidocaine patch reported 06/13/2021 vis telephone message.    Social History   Socioeconomic History   Marital status: Single    Spouse name: Not on file   Number of children: 0   Years of education: Not on file   Highest education level: 12th grade  Occupational History   Occupation: unemployed on disablity  Tobacco Use   Smoking status: Former    Current packs/day: 0.00    Average packs/day: 0.3 packs/day for 18.0 years (4.5 ttl pk-yrs)    Types: Cigarettes    Start date: 04/29/1984    Quit date: 04/29/2002    Years since quitting: 21.2   Smokeless tobacco: Never  Vaping Use   Vaping status: Never Used  Substance and Sexual Activity   Alcohol use: Not Currently   Drug use: Not  Currently   Sexual activity: Not Currently    Birth control/protection: Condom  Other Topics Concern   Not on file  Social History Narrative   Not on file   Social Drivers of Health   Financial Resource Strain: Low Risk  (07/06/2023)   Overall Financial Resource Strain (CARDIA)    Difficulty of Paying Living Expenses: Not hard at all  Food Insecurity: No Food Insecurity (07/06/2023)   Hunger Vital Sign    Worried About Running Out of Food in the Last Year: Never true    Ran Out of Food in the Last Year: Never true  Transportation Needs: No Transportation Needs (07/06/2023)   PRAPARE - Administrator, Civil Service (Medical): No    Lack of Transportation (Non-Medical): No  Physical Activity: Insufficiently Active (07/06/2023)   Exercise Vital Sign    Days of Exercise per Week: 3 days    Minutes of Exercise per Session: 10 min  Stress: No Stress Concern Present (07/06/2023)   [employer] of Occupational Health - Occupational Stress Questionnaire    Feeling of Stress : Only a little  Social Connections: Moderately Integrated (07/06/2023)   Social Connection and Isolation Panel [NHANES]    Frequency of Communication with Friends and Family: Twice a week    Frequency of Social Gatherings with Friends and Family: Twice a week    Attends Religious Services: 1 to 4 times per year    Active Member of [employer] or Organizations: No    Attends Engineer, structural: More than 4 times per year    Marital Status: Never married  Intimate Partner Violence: Not At Risk (03/06/2023)   Humiliation, Afraid, Rape, and Kick questionnaire    Fear of Current or Ex-Partner: No    Emotionally Abused: No    Physically Abused: No    Sexually Abused: No    Family History  Problem Relation Age of Onset   Diabetes Mellitus II Mother    Diabetes Mother    COPD Father    Diabetes Father    Colon cancer Father    Diabetes Mellitus II Maternal Grandmother    Breast cancer Paternal  Grandfather    Liver disease Neg Hx    Esophageal cancer Neg Hx     Past Surgical History:  Procedure Laterality Date   ABDOMINAL WALL DEFECT REPAIR  1970   EYE  SURGERY     When i was born   IR CV LINE INJECTION  10/24/2020   IR REMOVAL TUN ACCESS W/ PORT W/O FL MOD SED  05/28/2021   IVC FILTER INSERTION  2017   Lower Extremity Venous Duplex  06/23/2020   No evidence of DVT or superficial thrombosis bilaterally.  No evidence of deep venous insufficiency bilaterally.  No evidence of SSV reflux.  Right GSV in the calf has reflux, no reflux in L GSV.;  Repeated in July 2020-no DVT   OOPHORECTOMY  1996   OOPHORECTOMY  1997   PORTACATH PLACEMENT  2014   TRANSTHORACIC ECHOCARDIOGRAM  05/18/2021   EF 60 to 65%.  No RWMA.  Mild concentric LVH.  "Normal diastolic parameters ".  Normal longitudinal strain.  Normal PAP, RAP.  Normal aortic and mitral valves.==> In July 2022, echo read as GR 1 DD otherwise stable.    ROS: Review of Systems Negative except as stated above  PHYSICAL EXAM: BP 112/78 (BP Location: Left Arm, Patient Position: Sitting, Cuff Size: Large)   Pulse 97   Temp 98 F (36.7 C) (Oral)   Ht 5\' 9"  (1.753 m)   Wt 276 lb (125.2 kg)   SpO2 96%   BMI 40.76 kg/m   Physical Exam  General appearance - alert, well appearing, morbidly obese middle-age African-American female and in no distress Mental status - normal mood, behavior, speech, dress, motor activity, and thought processes Chest - clear to auscultation, no wheezes, rales or rhonchi, symmetric air entry Heart - normal rate, regular rhythm, normal S1, S2, no murmurs, rubs, clicks or gallops Extremities - peripheral pulses normal, no pedal edema, no clubbing or cyanosis  Results for orders placed or performed in visit on 08/07/23  POCT glucose (manual entry)   Collection Time: 08/07/23  2:21 PM  Result Value Ref Range   POC Glucose 100 (A) 70 - 99 mg/dl  POCT glycosylated hemoglobin (Hb A1C)   Collection Time:  08/07/23  2:32 PM  Result Value Ref Range   Hemoglobin A1C     HbA1c POC (<> result, manual entry)     HbA1c, POC (prediabetic range)     HbA1c, POC (controlled diabetic range) 5.3 0.0 - 7.0 %       Latest Ref Rng & Units 07/10/2023   12:09 PM 04/11/2023   11:51 AM 03/13/2023    2:51 PM  CMP  Glucose 70 - 99 mg/dL 95  85  86   BUN 6 - 20 mg/dL 8  9  8    Creatinine 0.44 - 1.00 mg/dL 1.32  4.40  1.02   Sodium 135 - 145 mmol/L 138  138  143   Potassium 3.5 - 5.1 mmol/L 4.0  4.0  3.8   Chloride 98 - 111 mmol/L 104  103  103   CO2 22 - 32 mmol/L 27  30  26    Calcium 8.9 - 10.3 mg/dL 9.2  9.5  9.5   Total Protein 6.5 - 8.1 g/dL 6.9  7.7    Total Bilirubin 0.0 - 1.2 mg/dL 1.1  0.6    Alkaline Phos 38 - 126 U/L 58  70    AST 15 - 41 U/L 15  11    ALT 0 - 44 U/L 10  5     Lipid Panel     Component Value Date/Time   CHOL 129 09/05/2022 1438   TRIG 70 09/05/2022 1438   HDL 49 09/05/2022 1438   CHOLHDL 2.6  09/05/2022 1438   LDLCALC 66 09/05/2022 1438    CBC    Component Value Date/Time   WBC 3.9 (L) 07/10/2023 1209   RBC 3.64 (L) 07/10/2023 1209   HGB 10.0 (L) 07/10/2023 1209   HGB 11.1 (L) 04/11/2023 1151   HGB 12.3 01/31/2022 1341   HCT 33.4 (L) 07/10/2023 1209   HCT 38.8 01/31/2022 1341   PLT 137 (L) 07/10/2023 1209   PLT 93 (L) 04/11/2023 1151   PLT 109 (L) 01/31/2022 1341   MCV 91.8 07/10/2023 1209   MCV 87 01/31/2022 1341   MCH 27.5 07/10/2023 1209   MCHC 29.9 (L) 07/10/2023 1209   RDW 14.4 07/10/2023 1209   RDW 12.3 01/31/2022 1341   LYMPHSABS 0.5 (L) 07/10/2023 1209   LYMPHSABS 0.5 (L) 11/29/2020 1445   MONOABS 0.4 07/10/2023 1209   EOSABS 0.0 07/10/2023 1209   EOSABS 0.0 11/29/2020 1445   BASOSABS 0.0 07/10/2023 1209   BASOSABS 0.0 11/29/2020 1445    ASSESSMENT AND PLAN:  1. Type 2 diabetes mellitus with morbid obesity (HCC) (Primary) A1c is at goal.  Commended her on weight loss.  From September of last year to now she is down 27 pounds.  Encouraged  her to continue eating smaller meals and try to increase her walking to 3 days a week.  She will continue Ozempic 2 mg once a week. - POCT glycosylated hemoglobin (Hb A1C) - POCT glucose (manual entry) - gabapentin (NEURONTIN) 400 MG capsule; Take 1 capsule (400 mg total) by mouth 3 (three) times daily.  Dispense: 270 capsule; Refill: 1 - Microalbumin / creatinine urine ratio  2. Long-term (current) use of injectable non-insulin antidiabetic drugs See #1 above.  3. Hypertension associated with diabetes (HCC) Continue spironolactone and torsemide  4. Iron deficiency anemia due to chronic blood loss Continue daily iron supplement.  She has follow-up appointment with her oncologist in June  5. Hyperlipidemia associated with type 2 diabetes mellitus (HCC) - atorvastatin (LIPITOR) 40 MG tablet; Take 1 tablet (40 mg total) by mouth daily.  Dispense: 90 tablet; Refill: 1  6. Coronary artery disease involving native coronary artery of native heart without angina pectoris Stable.  On atorvastatin and Eliquis  7. Closed fracture of multiple ribs of left side with routine healing, subsequent encounter I have given a limited supply refill on oxycodone.  Advised patient that it usually takes 4 to 6 weeks for rib fractures to heal.  Advised that the medication can cause drowsiness and not to take when having to drive or operate any machinery.  [place] controlled substance reporting system reviewed. - oxyCODONE (OXY IR/ROXICODONE) 5 MG immediate release tablet; Take 1 tablet (5 mg total) by mouth 2 (two) times daily as needed for severe pain (pain score 7-10).  Dispense: 20 tablet; Refill: 0  8. Major depressive disorder, recurrent episode, mild (HCC) Stable on Paxil. - PARoxetine (PAXIL) 20 MG tablet; Take 1 tablet (20 mg total) by mouth daily.  Dispense: 30 tablet; Refill: 6  9. History of DVT (deep vein thrombosis) - apixaban (ELIQUIS) 5 MG TABS tablet; Take 1 tablet (5 mg total) by mouth 2  (two) times daily.  Dispense: 180 tablet; Refill: 1  10. Asthma, moderate persistent, well-controlled Stable on Dulera. - mometasone-formoterol (DULERA) 200-5 MCG/ACT AERO; INHALE 2 PUFFS EVERY MORNING AND 2 PUFFS EVERY NIGHT AT BEDTIME  Dispense: 13 g; Refill: 11 - Ambulatory referral to Ophthalmology   Patient was given the opportunity to ask questions.  Patient verbalized understanding  of the plan and was able to repeat key elements of the plan.   This documentation was completed using Paediatric nurse.  Any transcriptional errors are unintentional.  Orders Placed This Encounter  Procedures   POCT glycosylated hemoglobin (Hb A1C)   POCT glucose (manual entry)     Requested Prescriptions   Pending Prescriptions Disp Refills   cholecalciferol (VITAMIN D3) 25 MCG (1000 UNIT) tablet 30 tablet 5    Sig: Take 1 tablet (1,000 Units total) by mouth daily.   docusate sodium (COLACE) 100 MG capsule      Sig: Take 1 capsule (100 mg total) by mouth 2 (two) times daily as needed for mild constipation.   gabapentin (NEURONTIN) 400 MG capsule 270 capsule 1    Sig: Take 1 capsule (400 mg total) by mouth 3 (three) times daily.   methocarbamol (ROBAXIN) 500 MG tablet 90 tablet 0    Sig: Take 2 tablets (1,000 mg total) by mouth every 8 (eight) hours as needed.   mometasone-formoterol (DULERA) 200-5 MCG/ACT AERO 13 g 11    Sig: INHALE 2 PUFFS EVERY MORNING AND 2 PUFFS EVERY NIGHT AT BEDTIME   nitroGLYCERIN (NITROSTAT) 0.4 MG SL tablet 25 tablet 2   PARoxetine (PAXIL) 20 MG tablet 30 tablet 6    Sig: Take 1 tablet (20 mg total) by mouth daily.   apixaban (ELIQUIS) 5 MG TABS tablet 180 tablet 1    Sig: Take 1 tablet (5 mg total) by mouth 2 (two) times daily.    No follow-ups on file.  [name], MD, FACP

## 2023-08-09 ENCOUNTER — Encounter: Payer: Self-pay | Admitting: Internal Medicine

## 2023-08-09 LAB — MICROALBUMIN / CREATININE URINE RATIO
Creatinine, Urine: 101.2 mg/dL
Microalb/Creat Ratio: 10 mg/g{creat} (ref 0–29)
Microalbumin, Urine: 10 ug/mL

## 2023-08-13 ENCOUNTER — Other Ambulatory Visit: Payer: Self-pay | Admitting: Internal Medicine

## 2023-08-13 DIAGNOSIS — E1169 Type 2 diabetes mellitus with other specified complication: Secondary | ICD-10-CM

## 2023-08-14 NOTE — Telephone Encounter (Signed)
 Requested Prescriptions  Pending Prescriptions Disp Refills   Semaglutide, 2 MG/DOSE, (OZEMPIC, 2 MG/DOSE,) 8 MG/3ML SOPN [Pharmacy Med Name: OZEMPIC 2 MG/DOSE (8 MG/3 ML)] 3 mL 2    Sig: DIAL AND INJECT 2 MG UNDER THE SKIN ONCE WEEKLY     Endocrinology:  Diabetes - GLP-1 Receptor Agonists - semaglutide Passed - 08/14/2023  2:28 PM      Passed - HBA1C in normal range and within 180 days    HbA1c, POC (prediabetic range)  Date Value Ref Range Status  04/07/2020 5.7 5.7 - 6.4 % Final   HbA1c, POC (controlled diabetic range)  Date Value Ref Range Status  08/07/2023 5.3 0.0 - 7.0 % Final         Passed - Cr in normal range and within 360 days    Creatinine  Date Value Ref Range Status  04/11/2023 0.70 0.44 - 1.00 mg/dL Final   Creatinine, Ser  Date Value Ref Range Status  07/10/2023 0.72 0.44 - 1.00 mg/dL Final         Passed - Valid encounter within last 6 months    Recent Outpatient Visits           1 week ago Type 2 diabetes mellitus with morbid obesity (HCC)   [facility] [place]. [facility] Lawrance Presume, MD   3 weeks ago Rib contusion, left, sequela   [facility] Dept Of [place]. [facility], Shelvy Dickens M, [place]   3 months ago Yeast infection   [facility] Dept Of [place]. [facility], Stan Eans, [place]   5 months ago Hospital discharge follow-up   [facility] [place] - A Dept Of [place]. [facility] Concetta Dee B, MD   11 months ago Type 2 diabetes mellitus with morbid obesity [facility])   [facility] - A Dept Of [place]. [facility] Lawrance Presume, MD       Future Appointments             In 2 months Addie Holstein Vashti Gentles, MD [facility] at [facility]

## 2023-08-19 ENCOUNTER — Encounter: Payer: Self-pay | Admitting: Hematology

## 2023-08-21 ENCOUNTER — Emergency Department (HOSPITAL_COMMUNITY)

## 2023-08-21 ENCOUNTER — Encounter: Payer: Self-pay | Admitting: Hematology

## 2023-08-21 ENCOUNTER — Other Ambulatory Visit: Payer: Self-pay

## 2023-08-21 ENCOUNTER — Ambulatory Visit: Payer: Self-pay | Admitting: Internal Medicine

## 2023-08-21 ENCOUNTER — Emergency Department (HOSPITAL_COMMUNITY)
Admission: EM | Admit: 2023-08-21 | Discharge: 2023-08-22 | Disposition: A | Discharge status: Home / Self Care | Attending: Emergency Medicine | Admitting: Emergency Medicine

## 2023-08-21 DIAGNOSIS — R519 Headache, unspecified: Secondary | ICD-10-CM

## 2023-08-21 DIAGNOSIS — Z8673 Personal history of transient ischemic attack (TIA), and cerebral infarction without residual deficits: Secondary | ICD-10-CM | POA: Diagnosis not present

## 2023-08-21 DIAGNOSIS — Z79899 Other long term (current) drug therapy: Secondary | ICD-10-CM | POA: Insufficient documentation

## 2023-08-21 DIAGNOSIS — I2510 Atherosclerotic heart disease of native coronary artery without angina pectoris: Secondary | ICD-10-CM | POA: Diagnosis not present

## 2023-08-21 DIAGNOSIS — J449 Chronic obstructive pulmonary disease, unspecified: Secondary | ICD-10-CM | POA: Insufficient documentation

## 2023-08-21 DIAGNOSIS — I10 Essential (primary) hypertension: Secondary | ICD-10-CM | POA: Diagnosis not present

## 2023-08-21 DIAGNOSIS — Z7901 Long term (current) use of anticoagulants: Secondary | ICD-10-CM | POA: Insufficient documentation

## 2023-08-21 DIAGNOSIS — R5383 Other fatigue: Secondary | ICD-10-CM

## 2023-08-21 DIAGNOSIS — E119 Type 2 diabetes mellitus without complications: Secondary | ICD-10-CM | POA: Insufficient documentation

## 2023-08-21 LAB — CBC WITH DIFFERENTIAL/PLATELET
Abs Immature Granulocytes: 0.01 10*3/uL (ref 0.00–0.07)
Basophils Absolute: 0 10*3/uL (ref 0.0–0.1)
Basophils Relative: 0 %
Eosinophils Absolute: 0.1 10*3/uL (ref 0.0–0.5)
Eosinophils Relative: 1 %
HCT: 39.4 % (ref 36.0–46.0)
Hemoglobin: 11.4 g/dL — ABNORMAL LOW (ref 12.0–15.0)
Immature Granulocytes: 0 %
Lymphocytes Relative: 15 %
Lymphs Abs: 0.7 10*3/uL (ref 0.7–4.0)
MCH: 26.3 pg (ref 26.0–34.0)
MCHC: 28.9 g/dL — ABNORMAL LOW (ref 30.0–36.0)
MCV: 91 fL (ref 80.0–100.0)
Monocytes Absolute: 0.4 10*3/uL (ref 0.1–1.0)
Monocytes Relative: 9 %
Neutro Abs: 3.4 10*3/uL (ref 1.7–7.7)
Neutrophils Relative %: 75 %
Platelets: 119 10*3/uL — ABNORMAL LOW (ref 150–400)
RBC: 4.33 MIL/uL (ref 3.87–5.11)
RDW: 13.6 % (ref 11.5–15.5)
WBC: 4.6 10*3/uL (ref 4.0–10.5)
nRBC: 0 % (ref 0.0–0.2)

## 2023-08-21 LAB — BASIC METABOLIC PANEL WITH GFR
Anion gap: 9 (ref 5–15)
BUN: 9 mg/dL (ref 6–20)
CO2: 27 mmol/L (ref 22–32)
Calcium: 9.5 mg/dL (ref 8.9–10.3)
Chloride: 102 mmol/L (ref 98–111)
Creatinine, Ser: 0.61 mg/dL (ref 0.44–1.00)
GFR, Estimated: >60 mL/min (ref >60)
Glucose, Bld: 83 mg/dL (ref 70–99)
Potassium: 4 mmol/L (ref 3.5–5.1)
Sodium: 138 mmol/L (ref 135–145)

## 2023-08-21 LAB — URINALYSIS, ROUTINE W REFLEX MICROSCOPIC
Bilirubin Urine: NEGATIVE
Glucose, UA: NEGATIVE mg/dL
Hgb urine dipstick: NEGATIVE
Ketones, ur: NEGATIVE mg/dL
Leukocytes,Ua: NEGATIVE
Nitrite: NEGATIVE
Protein, ur: NEGATIVE mg/dL
Specific Gravity, Urine: 1.02 (ref 1.005–1.030)
pH: 7 (ref 5.0–8.0)

## 2023-08-21 NOTE — ED Provider Triage Note (Signed)
 Emergency Medicine Provider Triage Evaluation Note  [name] , a 53 y.o. female  was evaluated in triage.  Pt complains of headache and lightheadedness..  Review of Systems  Positive: Lightheadedness Negative: Bleeding  Physical Exam  BP (!) 148/89   Pulse 85   Temp 97.8 F (36.6 C) (Oral)   Resp 18   SpO2 100%  No abdominal tenderness Medical Decision Making  Medically screening exam initiated at 2:20 PM.  Appropriate orders placed.  [name] was informed that the remainder of the evaluation will be completed by another provider, this initial triage assessment does not replace that evaluation, and the importance of remaining in the ED until their evaluation is complete.  Patient presents after headache.  Had headache now for around 2 weeks.  States she gets like this when she gets anemia.  Has been feeling lightheaded.  However is on Eliquis .  Did have a fall recently and had rib fractures on the left side.  No abdominal tenderness but examined in chair.   Megan Arias, MD 08/21/23 1421

## 2023-08-21 NOTE — Telephone Encounter (Signed)
 noted

## 2023-08-21 NOTE — ED Triage Notes (Signed)
 EMS stated, pt has had a headache x 2 weeks and feeling tired. No trauma on Elaquis . Hx of stroke with no deficits. Stutter is normal baseline stroke screen neg.  Took BP med this morning.  Pt. Stated, I've taken Ibuprofen and Advil for a week and nothing has help. Ive also been tired more than normal .

## 2023-08-21 NOTE — Telephone Encounter (Signed)
 Noted.

## 2023-08-21 NOTE — Telephone Encounter (Signed)
 Chief Complaint: Headache x1 week (9/10 pain level constant)  Symptoms: Fatigue past couple days, nausea, "feel like going to pass out," difficulty sleeping Pertinent Negatives: Patient denies vomiting, difficulty breathing, chest pain, numbness on one side of body, stiff neck  Disposition: [x] ED  Additional Notes: Pt thinks she needs a blood transfusion and states she has not had one since 2022. Pt is taking ibuprofen but it is not helping. This RN recommend pt goes to ED and offered to call pt an ambulance. Pt is agreeable to plan. This RN called 911 and EMS is on the way.   Copied from CRM [id number]. Topic: Clinical - Red Word Triage >> Aug 21, 2023 10:30 AM Antwanette L wrote: Red Word that prompted transfer to Nurse Triage: Pt is experiencing dizziness, fatigue, and headaches.  The pt is up all night and cannot go to sleep. The pt believes she needs a blood transfusion Reason for Disposition  [1] SEVERE headache (e.g., excruciating) AND [2] "worst headache" of life  Answer Assessment - Initial Assessment Questions Chief Complaint: Headache x1 week (9/10 pain level constant)  Symptoms: Fatigue past couple days, nausea, "feel like going to pass out," difficulty sleeping  Pertinent Negatives: Patient denies vomiting, difficulty breathing, chest pain, numbness on one side of body, stiff neck  Protocols used: Headache-A-AH

## 2023-08-22 MED ORDER — ACETAMINOPHEN 500 MG PO TABS
1000.0000 mg | ORAL_TABLET | Freq: Once | ORAL | Status: AC
  Administered 2023-08-22: 1000 mg via ORAL
  Filled 2023-08-22: qty 2

## 2023-08-22 NOTE — ED Provider Notes (Signed)
 [place] EMERGENCY DEPARTMENT AT [facility] Provider Note   CSN: [id number] Arrival date & time: 08/21/23  1148     History  Chief Complaint  Patient presents with   Headache   Fatigue    [name] is a 53 y.o. female.  Patient with history of stroke, HTN, DVT, SBO, CAD (nonocclusive), T2DM, HFpEF, OSA, COPD, presents for evaluation of headache for the past 2 weeks. Ibuprofen/Advil without relief. She feels excessively tired and fatigued and reports that when she feels this way she usually needs a blood transfusion. No vomiting, fever.   The history is provided by the patient. No language interpreter was used.  Headache      Home Medications Prior to Admission medications   Medication Sig Start Date End Date Taking? Authorizing Provider  amLODipine  (NORVASC ) 5 MG tablet Take 2.5 mg by mouth daily.    [provider]  apixaban  (ELIQUIS ) 5 MG TABS tablet Take 1 tablet (5 mg total) by mouth 2 (two) times daily. 08/07/23   Lawrance Presume, MD  atorvastatin  (LIPITOR) 40 MG tablet Take 1 tablet (40 mg total) by mouth daily. 08/07/23   Lawrance Presume, MD  cholecalciferol  (VITAMIN D3) 25 MCG (1000 UNIT) tablet Take 1 tablet (1,000 Units total) by mouth daily. 08/07/23   Lawrance Presume, MD  docusate sodium  (COLACE) 100 MG capsule Take 1 capsule (100 mg total) by mouth 2 (two) times daily as needed for mild constipation. 08/07/23   Lawrance Presume, MD  FEROSUL 325 (65 Fe) MG tablet TAKE ONE TABLET BY MOUTH DAILY WITH BREAKFAST Patient not taking: Reported on 08/07/2023 06/06/21   Lawrance Presume, MD  gabapentin  (NEURONTIN ) 400 MG capsule Take 1 capsule (400 mg total) by mouth 3 (three) times daily. 08/07/23   Lawrance Presume, MD  methocarbamol  (ROBAXIN ) 500 MG tablet Take 2 tablets (1,000 mg total) by mouth every 8 (eight) hours as needed. 07/24/23   Hassie Lint, PA-C  mometasone -formoterol  (DULERA ) 200-5 MCG/ACT AERO INHALE 2 PUFFS  EVERY MORNING AND 2 PUFFS EVERY NIGHT AT BEDTIME 08/07/23   Lawrance Presume, MD  nitroGLYCERIN  (NITROSTAT ) 0.4 MG SL tablet DISSOLVE 1 TABLET UNDER THE TONGUE AS NEEDED FOR CHEST PAIN EVERY 5 MINUTES UP TO 3 TIMES. IF NO RELIEF CALL 911. Patient taking differently: Place 0.4 mg under the tongue every 5 (five) minutes as needed for chest pain. 03/17/23   Lawrance Presume, MD  oxyCODONE  (OXY IR/ROXICODONE ) 5 MG immediate release tablet Take 1 tablet (5 mg total) by mouth 2 (two) times daily as needed for severe pain (pain score 7-10). 08/07/23   Lawrance Presume, MD  PARoxetine  (PAXIL ) 20 MG tablet Take 1 tablet (20 mg total) by mouth daily. 08/07/23   Lawrance Presume, MD  potassium chloride  SA (KLOR-CON  M) 20 MEQ tablet Take 2 tablets (40 mEq total) by mouth daily. 03/06/23   Lawrance Presume, MD  Semaglutide , 2 MG/DOSE, (OZEMPIC , 2 MG/DOSE,) 8 MG/3ML SOPN DIAL AND INJECT 2 MG UNDER THE SKIN ONCE WEEKLY 08/14/23   Lawrance Presume, MD  spironolactone  (ALDACTONE ) 25 MG tablet Take 1 tablet (25 mg total) by mouth daily. 03/06/23 04/05/23  Lawrance Presume, MD  torsemide  (DEMADEX ) 20 MG tablet Take 2 tablets (40 mg total) by mouth daily. Patient not taking: Reported on 08/07/2023 03/06/23 07/10/23  Lawrance Presume, MD  triamcinolone cream (KENALOG) 0.1 % Apply 1 Application topically daily as needed (skin irritation). 02/08/23  [provider]  vitamin B-12 (CYANOCOBALAMIN ) 1000 MCG tablet Take 1,000 mcg by mouth daily.    [provider]      Allergies    Ace inhibitors, Hydromorphone, Vancomycin, Contrast media [iodinated contrast media], Dilaudid [hydromorphone hcl], Farxiga  [dapagliflozin ], and Lidocaine     Review of Systems   Review of Systems  Neurological:  Positive for headaches.    Physical Exam Updated Vital Signs BP (!) 116/90   Pulse 89   Temp 97.8 F (36.6 C)   Resp 17   SpO2 100%  Physical Exam Vitals and nursing note reviewed.  Constitutional:       Appearance: She is well-developed.  HENT:     Head: Normocephalic.  Cardiovascular:     Rate and Rhythm: Normal rate and regular rhythm.     Heart sounds: No murmur heard. Pulmonary:     Effort: Pulmonary effort is normal.     Breath sounds: Normal breath sounds. No wheezing, rhonchi or rales.  Abdominal:     General: Bowel sounds are normal.     Palpations: Abdomen is soft.     Tenderness: There is no abdominal tenderness. There is no guarding or rebound.  Musculoskeletal:        General: Normal range of motion.     Cervical back: Normal range of motion and neck supple.  Skin:    General: Skin is warm and dry.  Neurological:     General: No focal deficit present.     Mental Status: She is alert and oriented to person, place, and time.     GCS: GCS eye subscore is 4. GCS verbal subscore is 5. GCS motor subscore is 6.     Cranial Nerves: No facial asymmetry.     Sensory: Sensation is intact.     Motor: No weakness or pronator drift.     Coordination: Finger-Nose-Finger Test normal.     Comments: Stutter that is chronic from previous CVA     ED Results / Procedures / Treatments   Labs (all labs ordered are listed, but only abnormal results are displayed) Labs Reviewed  CBC WITH DIFFERENTIAL/PLATELET - Abnormal; Notable for the following components:      Result Value   Hemoglobin 11.4 (*)    MCHC 28.9 (*)    Platelets 119 (*)    All other components within normal limits  URINALYSIS, ROUTINE W REFLEX MICROSCOPIC - Abnormal; Notable for the following components:   APPearance TURBID (*)    Bacteria, UA MANY (*)    All other components within normal limits  BASIC METABOLIC PANEL WITH GFR   Results for orders placed or performed during the hospital encounter of 08/21/23  CBC with Differential   Collection Time: 08/21/23  2:25 PM  Result Value Ref Range   WBC 4.6 4.0 - 10.5 K/uL   RBC 4.33 3.87 - 5.11 MIL/uL   Hemoglobin 11.4 (L) 12.0 - 15.0 g/dL   HCT 16.1 09.6 -  04.5 %   MCV 91.0 80.0 - 100.0 fL   MCH 26.3 26.0 - 34.0 pg   MCHC 28.9 (L) 30.0 - 36.0 g/dL   RDW 40.9 81.1 - 91.4 %   Platelets 119 (L) 150 - 400 K/uL   nRBC 0.0 0.0 - 0.2 %   Neutrophils Relative % 75 %   Neutro Abs 3.4 1.7 - 7.7 K/uL   Lymphocytes Relative 15 %   Lymphs Abs 0.7 0.7 - 4.0 K/uL   Monocytes Relative 9 %  Monocytes Absolute 0.4 0.1 - 1.0 K/uL   Eosinophils Relative 1 %   Eosinophils Absolute 0.1 0.0 - 0.5 K/uL   Basophils Relative 0 %   Basophils Absolute 0.0 0.0 - 0.1 K/uL   Immature Granulocytes 0 %   Abs Immature Granulocytes 0.01 0.00 - 0.07 K/uL  Basic metabolic panel   Collection Time: 08/21/23  2:25 PM  Result Value Ref Range   Sodium 138 135 - 145 mmol/L   Potassium 4.0 3.5 - 5.1 mmol/L   Chloride 102 98 - 111 mmol/L   CO2 27 22 - 32 mmol/L   Glucose, Bld 83 70 - 99 mg/dL   BUN 9 6 - 20 mg/dL   Creatinine, Ser 8.65 0.44 - 1.00 mg/dL   Calcium  9.5 8.9 - 10.3 mg/dL   GFR, Estimated >78 >46 mL/min   Anion gap 9 5 - 15  Urinalysis, Routine w reflex microscopic -Urine, Clean Catch   Collection Time: 08/21/23  2:52 PM  Result Value Ref Range   Color, Urine YELLOW YELLOW   APPearance TURBID (A) CLEAR   Specific Gravity, Urine 1.020 1.005 - 1.030   pH 7.0 5.0 - 8.0   Glucose, UA NEGATIVE NEGATIVE mg/dL   Hgb urine dipstick NEGATIVE NEGATIVE   Bilirubin Urine NEGATIVE NEGATIVE   Ketones, ur NEGATIVE NEGATIVE mg/dL   Protein, ur NEGATIVE NEGATIVE mg/dL   Nitrite NEGATIVE NEGATIVE   Leukocytes,Ua NEGATIVE NEGATIVE   RBC / HPF 0-5 0 - 5 RBC/hpf   WBC, UA 0-5 0 - 5 WBC/hpf   Bacteria, UA MANY (A) NONE SEEN   Squamous Epithelial / HPF 0-5 0 - 5 /HPF   Mucus PRESENT      EKG None  Radiology CT Head Wo Contrast Result Date: 08/21/2023 CLINICAL DATA:  Headache EXAM: CT HEAD WITHOUT CONTRAST TECHNIQUE: Contiguous axial images were obtained from the base of the skull through the vertex without intravenous contrast. RADIATION DOSE REDUCTION: This exam  was performed according to the departmental dose-optimization program which includes automated exposure control, adjustment of the mA and/or kV according to patient size and/or use of iterative reconstruction technique. COMPARISON:  head CT 07/10/2023. MRI head 11/26/2022. FINDINGS: Brain: No evidence of acute infarction, hemorrhage, hydrocephalus, extra-axial collection or mass lesion/mass effect. Vascular: No hyperdense vessel or unexpected calcification. Skull: Normal. Negative for fracture or focal lesion. Sinuses/Orbits: No acute finding. Other: None. IMPRESSION: No acute intracranial abnormality. Electronically Signed   By: Tyron Gallon M.D.   On: 08/21/2023 16:19    Procedures Procedures    Medications Ordered in ED Medications  acetaminophen  (TYLENOL ) tablet 1,000 mg (1,000 mg Oral Given 08/22/23 0316)    ED Course/ Medical Decision Making/ A&P Clinical Course as of 08/22/23 9629  Fri Aug 22, 2023  0618 Patient to ED with concern that she needs a blood transfusion because she has been feeling weak and fatigued. She also reports headache for the past 2 weeks. No fever, vomiting. Labs reassuring: No evidence of UTI; no leukocytosis, hemoglobin 11.4; no electrolyte abnormalities. No indication/need for blood transfusion. She was given Tylenol  for her headache and reports it is improved. No acute neurologic deficits. VSS. She is felt appropriate for discharge home. Encouraged follow up with PCP.  [SU]    Clinical Course User Index [SU] Mandy Second, PA-C                                 Medical Decision  Making Amount and/or Complexity of Data Reviewed Labs: ordered.  Risk OTC drugs.           Final Clinical Impression(s) / ED Diagnoses Final diagnoses:  Bad headache  Other fatigue    Rx / DC Orders ED Discharge Orders     None         Rama Burkitt 08/22/23 1610    Kelsey Patricia, MD 08/22/23 [phone]

## 2023-08-22 NOTE — Discharge Instructions (Signed)
 You were given Tylenol  for your headache with improvement in the ED. You can continue this at home along with continued Advil if needed.   Follow up with your doctor for recheck if your headache returns.

## 2023-09-01 ENCOUNTER — Other Ambulatory Visit: Payer: Self-pay | Admitting: Internal Medicine

## 2023-09-01 DIAGNOSIS — E1169 Type 2 diabetes mellitus with other specified complication: Secondary | ICD-10-CM

## 2023-09-01 DIAGNOSIS — S2242XD Multiple fractures of ribs, left side, subsequent encounter for fracture with routine healing: Secondary | ICD-10-CM

## 2023-09-01 NOTE — Telephone Encounter (Signed)
 Copied from CRM [id number]. Topic: Clinical - Medication Refill >> Sep 01, 2023 12:10 PM Yolanda T wrote: Most Recent Primary Care Visit:  Provider: Concetta Dee B  Department[facility]  Visit Type: HOSPITAL FU  Date: 08/07/2023  Medication: oxyCODONE  (OXY IR/ROXICODONE ) 5 MG immediate release tablet and Semaglutide , 2 MG/DOSE, (OZEMPIC , 2 MG/DOSE,) 8 MG/3ML SOPN  Has the patient contacted their pharmacy? No  Is this the correct pharmacy for this prescription? Yes If no, delete pharmacy and type the correct one.  This is the patient's preferred pharmacy:  [facility] [id number] - [place], [place] - [address] [place] [address] Phone: [phone] Fax: [phone]   Has the prescription been filled recently? Yes  Is the patient out of the medication? Yes  Has the patient been seen for an appointment in the last year OR does the patient have an upcoming appointment? Yes  Can we respond through MyChart? Yes  Agent: Please be advised that Rx refills may take up to 3 business days. We ask that you follow-up with your pharmacy.

## 2023-09-03 ENCOUNTER — Telehealth: Payer: Self-pay | Admitting: Internal Medicine

## 2023-09-03 ENCOUNTER — Ambulatory Visit: Payer: Self-pay

## 2023-09-03 DIAGNOSIS — S2242XD Multiple fractures of ribs, left side, subsequent encounter for fracture with routine healing: Secondary | ICD-10-CM

## 2023-09-03 MED ORDER — OZEMPIC (2 MG/DOSE) 8 MG/3ML ~~LOC~~ SOPN: PEN_INJECTOR | SUBCUTANEOUS | 4 refills | Status: DC

## 2023-09-03 NOTE — Telephone Encounter (Signed)
 Copied from CRM [id number]. Topic: Clinical - Medication Refill  >> Sep 01, 2023 12:10 PM Yolanda T wrote: Most Recent Primary Care Visit:  Provider: Concetta Dee B  [facility]  Visit Type: HOSPITAL FU  Date: 08/07/2023  Medication: oxyCODONE  (OXY IR/ROXICODONE ) 5 MG immediate release tablet and Semaglutide , 2 MG/DOSE, (OZEMPIC , 2 MG/DOSE,) 8 MG/3ML SOPN  Has the patient contacted their pharmacy? No  Is this the correct pharmacy for this prescription? Yes If no, delete pharmacy and type the correct one.  This is the patient's preferred pharmacy:  [facility] [id number] - [place], [place] - [address] [place] [address] Phone: [phone] Fax: [phone]   Has the prescription been filled recently? Yes  Is the patient out of the medication? Yes  Has the patient been seen for an appointment in the last year OR does the patient have an upcoming appointment? Yes  Can we respond through MyChart? Yes  Agent: Please be advised that Rx refills may take up to 3 business days. We ask that you follow-up with your pharmacy. >> Sep 02, 2023  5:48 PM Bridgette Campus T wrote: Checking status of refill

## 2023-09-03 NOTE — Telephone Encounter (Signed)
 Requested medication (s) are due for refill today: yes  Requested medication (s) are on the active medication list: yes  Last refill:  08/07/23  Future visit scheduled: no  Notes to clinic:  Unable to refill per protocol, cannot delegate.      Requested Prescriptions  Pending Prescriptions Disp Refills   oxyCODONE  (OXY IR/ROXICODONE ) 5 MG immediate release tablet 20 tablet 0    Sig: Take 1 tablet (5 mg total) by mouth 2 (two) times daily as needed for severe pain (pain score 7-10).     Not Delegated - Analgesics:  Opioid Agonists Failed - 09/03/2023  9:34 AM      Failed - This refill cannot be delegated      Failed - Urine Drug Screen completed in last 360 days      Passed - Valid encounter within last 3 months    Recent Outpatient Visits           3 weeks ago Type 2 diabetes mellitus with morbid obesity (HCC)   [facility] [place] [facility] Dept Of [place]. [facility] Lawrance Presume, MD   1 month ago Rib contusion, left, sequela   [facility] [facility] - [facility] Dept Of [place]. [facility], Shelvy Dickens M, [place]   3 months ago Yeast infection   [facility] [facility] [place] [facility] A Dept Of [place]. [facility] [place], Stan Eans, [place]   6 months ago Hospital discharge follow-up   [place] [facility] - A Dept Of [place]. [facility] Concetta Dee B, MD   12 months ago Type 2 diabetes mellitus with morbid obesity [facility])   [place] [facility] - A Dept Of [place]. [facility] Lawrance Presume, MD       Future Appointments             In 1 month Addie Holstein Vashti Gentles, MD [facility] at [facility] A Dept of [place]. [facility], H&[facility]             Semaglutide , 2 MG/DOSE, (OZEMPIC , 2 MG/DOSE,) 8 MG/3ML SOPN 3 mL 2     Endocrinology:  Diabetes - GLP-1 Receptor Agonists - semaglutide  Passed - 09/03/2023  9:34 AM      Passed - HBA1C in normal range and within 180 days    HbA1c, POC  (prediabetic range)  Date Value Ref Range Status  04/07/2020 5.7 5.7 - 6.4 % Final   HbA1c, POC (controlled diabetic range)  Date Value Ref Range Status  08/07/2023 5.3 0.0 - 7.0 % Final         Passed - Cr in normal range and within 360 days    Creatinine  Date Value Ref Range Status  04/11/2023 0.70 0.44 - 1.00 mg/dL Final   Creatinine, Ser  Date Value Ref Range Status  08/21/2023 0.61 0.44 - 1.00 mg/dL Final         Passed - Valid encounter within last 6 months    Recent Outpatient Visits           3 weeks ago Type 2 diabetes mellitus with morbid obesity (HCC)   [place] [facility] A Dept Of [place]. [facility] Lawrance Presume, MD   1 month ago Rib contusion, left, sequela    [facility] - A Dept Of [place]. [facility] [place], [place],  PA-C   3 months ago Yeast infection   [place] [facility] [place] - A Dept Of [place]. [facility] [place], Stan Eans, [place]   6 months ago Hospital discharge follow-up   [place] [facility] - A Dept Of [place]. [facility] Concetta Dee B, MD   12 months ago Type 2 diabetes mellitus with morbid obesity [facility] [place])   [place] [facility] - A Dept Of [place]. [facility] Lawrance Presume, MD       Future Appointments             In 1 month Addie Holstein Vashti Gentles, MD [facility] at [facility] A Dept of [place]. [facility], H&V

## 2023-09-03 NOTE — Telephone Encounter (Signed)
  Chief Complaint: rib pain/injury Symptoms: left rib pain, bruising and redness to left ribs Frequency: x 6 days Pertinent Negatives: Patient denies difficulty breathing, swelling Disposition: [] ED /[] Urgent Care (no appt availability in office) / [] Appointment(In office/virtual)/ []  [facility]/ [x] Home Care/ [] Refused Recommended Disposition /[] [facility]/ []  Follow-up with PCP Additional Notes: Patient had a fall on Friday with left rib injury and states she was seen in the ED and given a couple days prescription of oxycodone . Patient states she has been taking the muscle relaxer but is it not helping and she states she needs a refill on the oxycodone . Medication refill request for medication already created this morning. Patient states she has an appointment with PCP in June and is not interested in coming in again for a sooner appt, she states she would just like her pain medication refilled.  Copied from CRM [id number]. Topic: Clinical - Red Word Triage >> Sep 03, 2023  3:00 PM Elle L wrote: Red Word that prompted transfer to Nurse Triage: The patient fell Friday when she was mopping and hit her ribs. She is out of her pain medication oxyCODONE  (OXY IR/ROXICODONE ) 5 MG immediate release tablet and is experiencing pain. Reason for Disposition  [1] Chest wall swelling, bruise or pain AND [2] present < 7 days  Answer Assessment - Initial Assessment Questions 1. MECHANISM: "How did the injury happen?"     Patient states she mopping and had a slip and fall. She states her ribs hit her dishwasher.  2. ONSET: "When did the injury happen?" (Minutes or hours ago)     Friday.  3. LOCATION: "Where on the chest is the injury located?"    Left sided ribs.  4. APPEARANCE: "What does the injury look like?"     Bruising and redness.  5. BLEEDING: "Is there any bleeding now? If Yes, ask: How long has it been bleeding?"     Denies.  6. SEVERITY: "Any difficulty with  breathing?"     Denies.  7. SIZE: For cuts, bruises, or swelling, ask: "How large is it?" (e.g., inches or centimeters)     She states it is small but she can't see it very well to state the size exactly.  8. PAIN: "Is there pain?" If Yes, ask: "How bad is the pain?"   (e.g., Scale 1-10; or mild, moderate, severe)     8/10.  9. TETANUS: For any breaks in the skin, ask: "When was the last tetanus booster?"     2022.  10. PREGNANCY: "Is there any chance you are pregnant?" "When was your last menstrual period?"       N/A.  Protocols used: Chest Injury-A-AH

## 2023-09-04 MED ORDER — OXYCODONE HCL 5 MG PO TABS: 5.0000 mg | ORAL_TABLET | Freq: Two times a day (BID) | ORAL | 0 refills | Status: DC | PRN

## 2023-09-04 NOTE — Addendum Note (Signed)
 Addended by: Concetta Dee B on: 09/04/2023 08:42 AM   Modules accepted: Orders

## 2023-09-04 NOTE — Telephone Encounter (Signed)
 Let pt know that I will send one more RF on Oxycodone  then discontinue.  Rib fractures heal in 6-8 weeks.

## 2023-09-09 DIAGNOSIS — G4733 Obstructive sleep apnea (adult) (pediatric): Secondary | ICD-10-CM | POA: Diagnosis not present

## 2023-09-10 NOTE — Telephone Encounter (Signed)
 Called & spoke to the patient. Verified name & DOB. Informed that a last refill of Oxycodone  has been sent to the pharmacy and will then be discontinued. Informed patient that ribs take 6-8 weeks to heal. Patient expressed verbal understanding.

## 2023-09-23 ENCOUNTER — Other Ambulatory Visit: Payer: Self-pay | Admitting: Physician Assistant

## 2023-09-23 DIAGNOSIS — S20212S Contusion of left front wall of thorax, sequela: Secondary | ICD-10-CM

## 2023-09-25 ENCOUNTER — Telehealth: Payer: Self-pay | Admitting: Hematology

## 2023-09-25 ENCOUNTER — Encounter: Payer: Self-pay | Admitting: Internal Medicine

## 2023-09-25 DIAGNOSIS — Z0000 Encounter for general adult medical examination without abnormal findings: Secondary | ICD-10-CM

## 2023-09-25 NOTE — Telephone Encounter (Signed)
 Left a voicemail with the rescheduled appointment details and will be mailed an appointment reminder.

## 2023-10-04 ENCOUNTER — Other Ambulatory Visit: Payer: Self-pay | Admitting: Cardiology

## 2023-10-06 ENCOUNTER — Encounter (HOSPITAL_BASED_OUTPATIENT_CLINIC_OR_DEPARTMENT_OTHER): Attending: Internal Medicine | Admitting: General Surgery

## 2023-10-06 DIAGNOSIS — I5030 Unspecified diastolic (congestive) heart failure: Secondary | ICD-10-CM | POA: Insufficient documentation

## 2023-10-06 DIAGNOSIS — Z6841 Body Mass Index (BMI) 40.0 and over, adult: Secondary | ICD-10-CM | POA: Insufficient documentation

## 2023-10-06 DIAGNOSIS — L97822 Non-pressure chronic ulcer of other part of left lower leg with fat layer exposed: Secondary | ICD-10-CM | POA: Diagnosis not present

## 2023-10-06 DIAGNOSIS — E11622 Type 2 diabetes mellitus with other skin ulcer: Secondary | ICD-10-CM | POA: Insufficient documentation

## 2023-10-06 DIAGNOSIS — I872 Venous insufficiency (chronic) (peripheral): Secondary | ICD-10-CM | POA: Insufficient documentation

## 2023-10-06 DIAGNOSIS — I890 Lymphedema, not elsewhere classified: Secondary | ICD-10-CM | POA: Insufficient documentation

## 2023-10-13 ENCOUNTER — Other Ambulatory Visit: Payer: Commercial Managed Care - HMO

## 2023-10-13 ENCOUNTER — Ambulatory Visit: Payer: Commercial Managed Care - HMO | Admitting: Hematology

## 2023-10-13 ENCOUNTER — Encounter (HOSPITAL_BASED_OUTPATIENT_CLINIC_OR_DEPARTMENT_OTHER): Admitting: General Surgery

## 2023-10-13 DIAGNOSIS — I872 Venous insufficiency (chronic) (peripheral): Secondary | ICD-10-CM | POA: Diagnosis not present

## 2023-10-13 DIAGNOSIS — L97822 Non-pressure chronic ulcer of other part of left lower leg with fat layer exposed: Secondary | ICD-10-CM | POA: Diagnosis not present

## 2023-10-13 DIAGNOSIS — E11622 Type 2 diabetes mellitus with other skin ulcer: Secondary | ICD-10-CM | POA: Diagnosis not present

## 2023-10-15 ENCOUNTER — Other Ambulatory Visit: Payer: Self-pay

## 2023-10-15 DIAGNOSIS — D500 Iron deficiency anemia secondary to blood loss (chronic): Secondary | ICD-10-CM

## 2023-10-16 ENCOUNTER — Inpatient Hospital Stay: Payer: Self-pay | Attending: Hematology

## 2023-10-16 ENCOUNTER — Inpatient Hospital Stay (HOSPITAL_BASED_OUTPATIENT_CLINIC_OR_DEPARTMENT_OTHER): Payer: Self-pay | Admitting: Hematology

## 2023-10-16 VITALS — BP 141/87 | HR 99 | Temp 97.2°F | Resp 20 | Wt 269.7 lb

## 2023-10-16 DIAGNOSIS — D500 Iron deficiency anemia secondary to blood loss (chronic): Secondary | ICD-10-CM | POA: Diagnosis not present

## 2023-10-16 DIAGNOSIS — E538 Deficiency of other specified B group vitamins: Secondary | ICD-10-CM | POA: Insufficient documentation

## 2023-10-16 LAB — CMP (CANCER CENTER ONLY)
ALT: 5 U/L (ref 0–44)
AST: 10 U/L — ABNORMAL LOW (ref 15–41)
Albumin: 4 g/dL (ref 3.5–5.0)
Alkaline Phosphatase: 82 U/L (ref 38–126)
Anion gap: 7 (ref 5–15)
BUN: 12 mg/dL (ref 6–20)
CO2: 29 mmol/L (ref 22–32)
Calcium: 9.6 mg/dL (ref 8.9–10.3)
Chloride: 104 mmol/L (ref 98–111)
Creatinine: 0.73 mg/dL (ref 0.44–1.00)
GFR, Estimated: >60 mL/min (ref >60)
Glucose, Bld: 86 mg/dL (ref 70–99)
Potassium: 3.7 mmol/L (ref 3.5–5.1)
Sodium: 140 mmol/L (ref 135–145)
Total Bilirubin: 0.8 mg/dL (ref 0.0–1.2)
Total Protein: 8 g/dL (ref 6.5–8.1)

## 2023-10-16 LAB — CBC WITH DIFFERENTIAL (CANCER CENTER ONLY)
Abs Immature Granulocytes: 0.03 10*3/uL (ref 0.00–0.07)
Basophils Absolute: 0 10*3/uL (ref 0.0–0.1)
Basophils Relative: 0 %
Eosinophils Absolute: 0 10*3/uL (ref 0.0–0.5)
Eosinophils Relative: 1 %
HCT: 36.4 % (ref 36.0–46.0)
Hemoglobin: 11 g/dL — ABNORMAL LOW (ref 12.0–15.0)
Immature Granulocytes: 1 %
Lymphocytes Relative: 10 %
Lymphs Abs: 0.7 10*3/uL (ref 0.7–4.0)
MCH: 27.2 pg (ref 26.0–34.0)
MCHC: 30.2 g/dL (ref 30.0–36.0)
MCV: 90.1 fL (ref 80.0–100.0)
Monocytes Absolute: 0.5 10*3/uL (ref 0.1–1.0)
Monocytes Relative: 8 %
Neutro Abs: 5.3 10*3/uL (ref 1.7–7.7)
Neutrophils Relative %: 80 %
Platelet Count: 161 10*3/uL (ref 150–400)
RBC: 4.04 MIL/uL (ref 3.87–5.11)
RDW: 14.7 % (ref 11.5–15.5)
WBC Count: 6.5 10*3/uL (ref 4.0–10.5)
nRBC: 0 % (ref 0.0–0.2)

## 2023-10-16 LAB — IRON AND IRON BINDING CAPACITY (CC-WL,HP ONLY)
Iron: 61 ug/dL (ref 28–170)
Saturation Ratios: 20 % (ref 10.4–31.8)
TIBC: 312 ug/dL (ref 250–450)
UIBC: 251 ug/dL (ref 148–442)

## 2023-10-16 LAB — FERRITIN: Ferritin: 108 ng/mL (ref 11–307)

## 2023-10-16 NOTE — Progress Notes (Signed)
 HEMATOLOGY/ONCOLOGY CLINIC NOTE  Date of Service: 10/22/23    Patient Care Team: Megan Barnie NOVAK, MD as PCP - General (Internal Medicine) Megan Alm ORN, MD as PCP - Cardiology (Cardiology)  CHIEF COMPLAINTS/PURPOSE OF FOLLOW UP:  Follow-up for continued management of iron deficiency anemia  HISTORY OF PRESENTING ILLNESS:  [name] is a wonderful 53 y.o. female who has been referred to us  by Dr. Vicci for evaluation and management of anemia. The pt reports that she is doing well overall.   The pt reports that she has had anemia since birth but is unaware of any genetic condition causing her anemia. She received IV iron for about 4 years but stopped 2 years ago when she moved to [place]. Pt had a Port-a-Cath placed for repeat IV Iron Infusions due to issues accessing her veins.   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 She has been on anticoagulation for nearly 10 years and denies any repeat blood clots since starting Eliquis . Her PCP is currently managing her anticoagulation. 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 The ulcer tends to heal in the winter and reopen in the summer. It is currently healed with no open area.  Pt had a Colonoscopy and EGD at [facility] in [place] in August of 2021 that were benign. She was experiencing bloody stools frequently but hasn't had any after her 2021 GI studies. She has discontinued Aspirin due to concerns for excessive blood loss.   Pt has had a bilateral oophorectomies. She does not recall being diagnosed with PCOS. She denies gluten intolerance or any thyroid dysfunction. Pt had a fall in 2019 that is causing ongong back pain. She was seen after the fall and was found to have injury at L1 & L2. She has been given Tylenol  for pain management. Pt has a history of chronic acid suppression. She had  stopped but recently restarted Prilosec. She was Vitamin B12 deficient for about one year. Pt had a heart attack in but denies any history of liver disease. Pt has had her COVID19 vaccines and booster.  Most recent lab results  of CBC w/diff is as follows: all values are WNL except for RBC at 3.80, Hgb at 10.1, HCT at 35.3, MCHC at 28.6, RDW at 16.2, PLT at 148K.  On review of systems, pt reports fatigue and denies nose bleeds, gum bleeds, bloody stools, heavy menstrual losses, fevers, unexpected weight loss, chills, new back pain, abdominal pain and any other symptoms.   On PMHx the pt reports B/L Oophorectomy, Myocardial infarction, DVT, Iron deficiency anemia due to chronic blood loss.  INTERVAL HISTORY   [name] is a wonderful 53 y.o. female who is here for continued valuation and management of her anemia.    She was last seen by me on 04/11/2023 and reported a small hematoma on right arm, rectal bleeding from hemorrhoids, constant daily dizziness, weight loss, stomach pain and back soreness between her rib cage.  Patient noted to present to ED on 07/28/2023 for closed fracture of multiple ribs of left side. She reports that she suffered a fall due to tripping over a rug in her PCP's office. She reports that her pain from rib fracture has improved.   He reports that her fall also caused her leg wound to reopen with clear fluid without bleeding. Her wound is addressed with wound  care and is healing now. She reports that she does have her legs wrapped regularly, which is changed once every 3 weeks.   Patient was noted to have a CT scan of abdomen/pelvis in March due to vomiting concerns after eating. She also notes having hemorrhoid issues causing upset stomach and abdominal pain. There was no obvious obstruction.   She notes that she initially presented to urgent care, and was recommended to go to the ED. While there, she notes that she was given stool softener to help have a  bowel movement. Her stools eventually normalized.   Patient has no issues with getting food down at this time.   She is taking Eliquis  5 MG and denies any obvious bleeding. Patient denies any black stools, or blood in stools.  Patient reports that she is losing weight with Ozempic  2 MG. Her weight is noted to be 269 pounds in clinic today.   Patient complains of very intense leg pain and notes that she is unable to stand on it. She reports that her PCP prescribed her a dose of percocet previously, which improved her pain in the past. However, she complains that her pain is intense at this time. Patient reports that she cannot take aspirin or tylenol .   Patient reports that her PCP has referred her to a pain management clinic. Her pain management clinic has since closed down.   Patient reports that she will be leaving to travel to [place]  at the end of the month to attend her cousin's wedding. She will return on July 4th.   Patient notes having a recent mammogram. She reports an area under the right breast which was recently very tender. Patient notes that her pain was on the side where she broke her rib. She notes that she was told that her pain would likely resolve on its own and she should continue to monitor.   MEDICAL HISTORY:  Past Medical History:  Diagnosis Date   (HFpEF) heart failure with preserved ejection fraction (HCC) -> although echo suggests normal diastolic parameters with normal left atrial size 04/13/2020   Allergy     Arthritis    Cellulitis    Clotting disorder (HCC) 2024   COPD (chronic obstructive pulmonary disease) (HCC)    Coronary artery disease, non-occlusive    OBSTRUCTIVE: PT STATES - HAD A CARDIAC CATH - NOT TOLD SHE HAD CAD -> week note from [place]  indicates history of MI (patient cannot corroborate   Diabetes mellitus without complication (HCC)    DVT (deep venous thrombosis) (HCC) 09/17/2017   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   Morbid obesity due to excess calories (HCC)    OSA (obstructive sleep apnea) 07/11/2021   Sleep study April 06, 2019 (Dr. Shellia): Moderate OSA (AHI of 24.4 and SPO2 low of 77%).  Majority events during REM sleep.-recommend CPAP, oral appliance or surgical. Reviewed sleep hygiene.  Avoid sedatives.     Osteoarthritis of left knee    Prediabetes    Sleep apnea    Small bowel obstruction (HCC)    as a child   Speech impediment    Stutter /  stammer   Stroke [facility]) 2007    SURGICAL HISTORY: Past Surgical History:  Procedure Laterality Date   ABDOMINAL WALL DEFECT REPAIR  1970   EYE SURGERY     When i was born   IR CV LINE INJECTION  10/24/2020   IR REMOVAL TUN ACCESS W/ PORT W/O FL MOD SED  05/28/2021   IVC FILTER INSERTION  2017   Lower Extremity Venous Duplex  06/23/2020   No evidence of DVT or superficial thrombosis bilaterally.  No evidence of deep venous insufficiency bilaterally.  No evidence of SSV reflux.  Right GSV in the calf has reflux, no reflux in L GSV.;  Repeated in July 2020-no DVT   OOPHORECTOMY  1996   OOPHORECTOMY  1997   PORTACATH PLACEMENT  2014   TRANSTHORACIC ECHOCARDIOGRAM  05/18/2021   EF 60 to 65%.  No RWMA.  Mild concentric LVH.  Normal diastolic parameters .  Normal longitudinal strain.  Normal PAP, RAP.  Normal aortic and mitral valves.==> In July 2022, echo read as GR 1 DD otherwise stable.    SOCIAL HISTORY: Social History   Socioeconomic History   Marital status: Single    Spouse name: Not on file   Number of children: 0   Years of education: Not on file   Highest education level: Some college, no degree  Occupational  History   Occupation: unemployed on disablity  Tobacco Use   Smoking status: Former    Current packs/day: 0.00    Average packs/day: 0.3 packs/day for 18.0 years (4.5 ttl pk-yrs)    Types: Cigarettes    Start date: 04/29/1984    Quit date: 04/29/2002    Years since quitting: 21.4   Smokeless tobacco: Never  Vaping Use   Vaping status: Never Used  Substance and Sexual Activity   Alcohol use: Not Currently   Drug use: Not Currently   Sexual activity: Not Currently    Birth control/protection: Condom  Other Topics Concern   Not on file  Social History Narrative   Not on file   Social Drivers of Health   Financial Resource Strain: Low Risk  (10/20/2023)   Overall Financial Resource Strain (CARDIA)    Difficulty of Paying Living Expenses: Not hard at all  Food Insecurity: No Food Insecurity (10/20/2023)   Hunger Vital Sign    Worried About Running Out of Food in the Last Year: Never true    Ran Out of Food in the Last Year: Never true  Transportation Needs: No Transportation Needs (10/20/2023)   PRAPARE - Administrator, Civil Service (Medical): No    Lack of Transportation (Non-Medical): No  Physical Activity: Inactive (10/20/2023)   Exercise Vital Sign    Days of Exercise per Week: 0 days    Minutes of Exercise per Session: Not on file  Stress: No Stress Concern Present (10/20/2023)   [employer] of Occupational Health - Occupational Stress Questionnaire    Feeling of Stress: Only a little  Social Connections: Moderately Isolated (10/20/2023)   Social Connection and Isolation Panel    Frequency of Communication with Friends and Family: More than three times a week    Frequency of Social Gatherings with Friends and Family: Twice a week    Attends Religious Services: 1 to 4 times per year    Active Member of [employer] or Organizations: No    Attends Engineer, structural: Not on file    Marital Status: Never married  Intimate Partner Violence: Not  At Risk  (03/06/2023)   Humiliation, Afraid, Rape, and Kick questionnaire    Fear of Current or Ex-Partner: No    Emotionally Abused: No    Physically Abused: No    Sexually Abused: No    FAMILY HISTORY: Family History  Problem Relation Age of Onset   Diabetes Mellitus II Mother    Diabetes Mother    COPD Father    Diabetes Father    Colon cancer Father    Diabetes Mellitus II Maternal Grandmother    Breast cancer Paternal Grandfather    Liver disease Neg Hx    Esophageal cancer Neg Hx     ALLERGIES:  is allergic to ace inhibitors, hydromorphone, vancomycin, contrast media [iodinated contrast media], dilaudid [hydromorphone hcl], farxiga  [dapagliflozin ], and lidocaine .  MEDICATIONS:  Current Outpatient Medications  Medication Sig Dispense Refill   amLODipine  (NORVASC ) 5 MG tablet Take 2.5 mg by mouth daily.     apixaban  (ELIQUIS ) 5 MG TABS tablet Take 1 tablet (5 mg total) by mouth 2 (two) times daily. 180 tablet 1   atorvastatin  (LIPITOR) 40 MG tablet Take 1 tablet (40 mg total) by mouth daily. 90 tablet 1   cholecalciferol  (VITAMIN D3) 25 MCG (1000 UNIT) tablet Take 1 tablet (1,000 Units total) by mouth daily. 30 tablet 5   docusate sodium  (COLACE) 100 MG capsule Take 1 capsule (100 mg total) by mouth 2 (two) times daily as needed for mild constipation.     FEROSUL 325 (65 Fe) MG tablet TAKE ONE TABLET BY MOUTH DAILY WITH BREAKFAST 100 tablet 0   gabapentin  (NEURONTIN ) 400 MG capsule Take 1 capsule (400 mg total) by mouth 3 (three) times daily. 270 capsule 1   methocarbamol  (ROBAXIN ) 500 MG tablet Take 1 tablet (500 mg total) by mouth every 8 (eight) hours as needed for muscle spasms. 60 tablet 0   mometasone -formoterol  (DULERA ) 200-5 MCG/ACT AERO INHALE 2 PUFFS EVERY MORNING AND 2 PUFFS EVERY NIGHT AT BEDTIME 13 g 11   nitroGLYCERIN  (NITROSTAT ) 0.4 MG SL tablet DISSOLVE 1 TABLET UNDER THE TONGUE AS NEEDED FOR CHEST PAIN EVERY 5 MINUTES UP TO 3 TIMES. IF NO RELIEF CALL 911. (Patient  taking differently: Place 0.4 mg under the tongue every 5 (five) minutes as needed for chest pain.) 25 tablet 2   oxyCODONE  (OXY IR/ROXICODONE ) 5 MG immediate release tablet Take 1 tablet (5 mg total) by mouth 2 (two) times daily as needed for severe pain (pain score 7-10). 20 tablet 0   PARoxetine  (PAXIL ) 20 MG tablet Take 1 tablet (20 mg total) by mouth daily. 30 tablet 6   potassium chloride  SA (KLOR-CON  M) 20 MEQ tablet Take 2 tablets (40 mEq total) by mouth daily. 180 tablet 2   Semaglutide , 2 MG/DOSE, (OZEMPIC , 2 MG/DOSE,) 8 MG/3ML SOPN DIAL AND INJECT 2 MG UNDER THE SKIN ONCE WEEKLY 3 mL 4   spironolactone  (ALDACTONE ) 25 MG tablet Take 1 tablet (25 mg total) by mouth daily. 30 tablet 0   torsemide  (DEMADEX ) 20 MG tablet Take 2 tablets (40 mg total) by mouth daily. 180 tablet 1   triamcinolone cream (KENALOG) 0.1 % Apply 1 Application topically daily as needed (skin irritation).     vitamin B-12 (CYANOCOBALAMIN ) 1000 MCG tablet Take 1,000 mcg by mouth daily.     No current facility-administered medications for this visit.    REVIEW OF SYSTEMS:    10 Point review of Systems was done is negative except as noted above.   PHYSICAL EXAMINATION: ECOG PERFORMANCE  STATUS: 2 - Symptomatic, <50% confined to bed  . Vitals:   10/16/23 1358  BP: (!) 141/87  Pulse: 99  Resp: 20  Temp: (!) 97.2 F (36.2 C)  SpO2: 99%   Filed Weights   10/16/23 1358  Weight: 269 lb 11.2 oz (122.3 kg)   Body mass index is 39.83 kg/m.  GENERAL:alert, in no acute distress and comfortable SKIN: no acute rashes, no significant lesions EYES: conjunctiva are pink and non-injected, sclera anicteric OROPHARYNX: MMM, no exudates, no oropharyngeal erythema or ulceration NECK: supple, no JVD LYMPH:  no palpable lymphadenopathy in the cervical, axillary or inguinal regions LUNGS: clear to auscultation b/l with normal respiratory effort HEART: regular rate & rhythm ABDOMEN:  normoactive bowel sounds , non tender,  not distended. Extremity: no pedal edema PSYCH: alert & oriented x 3 with fluent speech NEURO: no focal motor/sensory deficits   LABORATORY DATA:  I have reviewed the data as listed     Latest Ref Rng & Units 10/16/2023    1:38 PM 08/21/2023    2:25 PM 07/10/2023   12:09 PM  CBC  WBC 4.0 - 10.5 K/uL 6.5  4.6  3.9   Hemoglobin 12.0 - 15.0 g/dL 88.9  88.5  89.9   Hematocrit 36.0 - 46.0 % 36.4  39.4  33.4   Platelets 150 - 400 K/uL 161  119  137    . CBC    Component Value Date/Time   WBC 6.5 10/16/2023 1338   WBC 4.6 08/21/2023 1425   RBC 4.04 10/16/2023 1338   HGB 11.0 (L) 10/16/2023 1338   HGB 12.3 01/31/2022 1341   HCT 36.4 10/16/2023 1338   HCT 38.8 01/31/2022 1341   PLT 161 10/16/2023 1338   PLT 109 (L) 01/31/2022 1341   MCV 90.1 10/16/2023 1338   MCV 87 01/31/2022 1341   MCH 27.2 10/16/2023 1338   MCHC 30.2 10/16/2023 1338   RDW 14.7 10/16/2023 1338   RDW 12.3 01/31/2022 1341   LYMPHSABS 0.7 10/16/2023 1338   LYMPHSABS 0.5 (L) 11/29/2020 1445   MONOABS 0.5 10/16/2023 1338   EOSABS 0.0 10/16/2023 1338   EOSABS 0.0 11/29/2020 1445   BASOSABS 0.0 10/16/2023 1338   BASOSABS 0.0 11/29/2020 1445    .    Latest Ref Rng & Units 10/16/2023    1:38 PM 08/21/2023    2:25 PM 07/10/2023   12:09 PM  CMP  Glucose 70 - 99 mg/dL 86  83  95   BUN 6 - 20 mg/dL 12  9  8    Creatinine 0.44 - 1.00 mg/dL 9.26  9.38  9.27   Sodium 135 - 145 mmol/L 140  138  138   Potassium 3.5 - 5.1 mmol/L 3.7  4.0  4.0   Chloride 98 - 111 mmol/L 104  102  104   CO2 22 - 32 mmol/L 29  27  27    Calcium  8.9 - 10.3 mg/dL 9.6  9.5  9.2   Total Protein 6.5 - 8.1 g/dL 8.0   6.9   Total Bilirubin 0.0 - 1.2 mg/dL 0.8   1.1   Alkaline Phos 38 - 126 U/L 82   58   AST 15 - 41 U/L 10   15   ALT 0 - 44 U/L 5   10    . Lab Results  Component Value Date   IRON 61 10/16/2023   TIBC 312 10/16/2023   IRONPCTSAT 20 10/16/2023   (Iron and TIBC)  Lab Results  Component Value Date   FERRITIN 108  10/16/2023    RADIOGRAPHIC STUDIES: I have personally reviewed the radiological images as listed and agreed with the findings in the report. No results found.   ASSESSMENT & PLAN:   53 y.o. with   1) Chronic iron deficiency anemia requiring recurrent IV Iron infusions. Likely from chronic GI losses and previously due to blood loss from venous stasis ulcer in the setting of chronic anticoagulation. 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 Patient Active Problem List   Diagnosis Date Noted   Abdominal pain in female patient 06/24/2023   Peripheral edema 01/16/2023   Chronic diastolic CHF (congestive heart failure) (HCC) 01/16/2023   Lack of motivation 01/08/2023   Anasarca 01/07/2023   Orthostatic dizziness 11/27/2022   Vertigo 11/26/2022   Type 2 diabetes mellitus with morbid obesity (HCC) 09/18/2022   Moderate major depression (HCC) 05/07/2022   Hyperlipidemia with target LDL less than 100 02/17/2022   Pain in thoracic spine 09/25/2021   Low back pain 09/25/2021   OSA (obstructive sleep apnea) 07/11/2021   Depression 05/28/2021   Morbid obesity with BMI of 50.0-59.9, adult (HCC) 05/26/2021   Bilateral lower extremity edema 11/22/2020   History of DVT (deep vein thrombosis) 11/22/2020   Asthma, chronic, moderate persistent, with acute exacerbation 11/22/2020   Lymphedema 06/09/2020   Iron deficiency anemia due to chronic blood loss 05/10/2020   Venous insufficiency 04/13/2020   S/P insertion of IVC (inferior vena caval) filter 04/13/2020   Chronic anemia 04/13/2020   Essential hypertension 04/13/2020   (HFpEF) heart failure with preserved ejection fraction (HCC) -> although echo suggests normal diastolic parameters with normal left atrial size 04/13/2020   CAD (coronary artery disease) 11/17/2019   PLAN:  -Discussed lab results on  10/16/23 in detail with patient. CBC fairly stable, showed WBC of 6.5K, hemoglobin of 11, and platelets of 161K. -hgb improved from 8 nine months ago to now 11 -platelets, which were previously low, possibly from pain medication, has normalized  -WBCs normal -CMP normal Ferritin 108 with iron saturation of 20% No indication for additional IV iron at this time . 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 Discussed that these lymph nodes could be reactive from the skin opening up due to her fall and causing inflammation or from obstructive symptoms -will review iron labs and decide whether there is a need for IV iron or not -discussed that her non-cancer pain would typically be addressed by her PCP possibly either with prescription or pain management clinic -discussed that usually, in the case that her pain management clinic closes down, her PCP's office would generally need to refer her to a different one -discussed option to connect with  [facility] for medicare and medicaid to see what she is covered for and ask any questions about her insurance -continue Eliquis  5MG  -patient will be traveling at the end of June-early July -recommend taking precautions during her up-coming travel from a blood clot prevention standpoint, including staying well-hydrated and taking frequent breaks to walk around.  -recommend getting a fresh wound covering from wound care just before her vacation -discussed that her tenderness under the breast  is expected given her recent fractured rib near the area.  -discussed that generally, as long as there is no displacement issue, her ribs should continue to heal  -if there is anything concerning on her recent mammogram, her PCP shall refer her to us   -answered  all of patient's questions in detail -she shall return to clinic in 6 months  FOLLOW UP: RTC with Dr Onesimo with labs in 6 months  The total time spent in the appointment was 21 minutes* .  All of the patient's questions were answered with apparent satisfaction. The patient knows to call the clinic with any problems, questions or concerns.   Emaline Onesimo MD MS AAHIVMS [facility] Hematology/Oncology Physician [facility]  .*Total Encounter Time as defined by the Centers for Medicare and Medicaid Services includes, in addition to the face-to-face time of a patient visit (documented in the note above) non-face-to-face time: obtaining and reviewing outside history, ordering and reviewing medications, tests or procedures, care coordination (communications with other health care professionals or caregivers) and documentation in the medical record.    I,Mitra Faeizi,acting as a Neurosurgeon for Emaline Onesimo, MD.,have documented all relevant documentation on the behalf of Emaline Onesimo, MD,as directed by  Emaline Onesimo, MD while in the presence of Emaline Onesimo, MD.  .I have reviewed the above documentation for accuracy and completeness, and I agree with the above. .Lisseth Brazeau Kishore Xitlally Mooneyham MD

## 2023-10-20 ENCOUNTER — Ambulatory Visit: Payer: Commercial Managed Care - HMO | Attending: Cardiology | Admitting: Cardiology

## 2023-10-20 VITALS — BP 124/80 | HR 85 | Ht 69.0 in | Wt 253.0 lb

## 2023-10-20 DIAGNOSIS — I5032 Chronic diastolic (congestive) heart failure: Secondary | ICD-10-CM | POA: Diagnosis not present

## 2023-10-20 DIAGNOSIS — I10 Essential (primary) hypertension: Secondary | ICD-10-CM | POA: Diagnosis not present

## 2023-10-20 DIAGNOSIS — I890 Lymphedema, not elsewhere classified: Secondary | ICD-10-CM | POA: Insufficient documentation

## 2023-10-20 DIAGNOSIS — E785 Hyperlipidemia, unspecified: Secondary | ICD-10-CM | POA: Diagnosis not present

## 2023-10-20 DIAGNOSIS — E1169 Type 2 diabetes mellitus with other specified complication: Secondary | ICD-10-CM | POA: Diagnosis not present

## 2023-10-20 DIAGNOSIS — Z86718 Personal history of other venous thrombosis and embolism: Secondary | ICD-10-CM | POA: Diagnosis not present

## 2023-10-20 DIAGNOSIS — Z6841 Body Mass Index (BMI) 40.0 and over, adult: Secondary | ICD-10-CM | POA: Insufficient documentation

## 2023-10-20 DIAGNOSIS — I872 Venous insufficiency (chronic) (peripheral): Secondary | ICD-10-CM | POA: Diagnosis not present

## 2023-10-20 DIAGNOSIS — G4733 Obstructive sleep apnea (adult) (pediatric): Secondary | ICD-10-CM | POA: Diagnosis not present

## 2023-10-20 NOTE — Progress Notes (Addendum)
 Cardiology Office Note:  .   Date:  10/27/2023  ID:  [name], DOB 1970-05-14, MRN [id number] PCP: Megan Barnie NOVAK, MD  [facility] Providers Cardiologist:  Alm Clay, MD Cardiology APP:  Megan Rollo SAUNDERS, PA-C     Chief Complaint  Patient presents with   Follow-up    Overall doing pretty well.  Losing weight on Ozempic .    Patient Profile: .     [name] is a moderately obese 53 y.o. female  with a PMH notable for DVT, OSA, type 2 diabetes, asthma/COPD, morbid obesity, hypertension, HFpEF. who presents here for 5-6 month at the request of Megan Barnie NOVAK, MD.  CV History: July 2022: EF showed mild LVH and GR 1 DD. =>  Diagnosed with Hypertensive Heart Disease ?  DVT.  On long-term Eliquis .       [name] admitted with dizziness and vertigo in late July 2024.  Normal Echo.  Lasix  was held with possibly this being orthostasis. => Discharged to SNF and admitted in September 2024 with worsening lower extremity weight gain thought to relate to A-on-C HFpEF => she was diuresed 40 pounds when discharged back to SNF on torsemide  40 Miller daily, spironolactone  25 mg daily and Farxiga  10 mg. => Felt to be NYHA class IIIb CHF?  Was not getting her CPAP at the facility.  was last seen by Rollo Louder, PA on 05/29/2023 as a 28-month follow-up to revisit.  In October patient denied any symptoms.  Recommended PT.  In the January visit she evidence of daily cardiac symptoms was more worried about vaginal yeast infections recovering sex over the last year.  Both spironolactone  and Farxiga  had been stopped with some improvement.  No active cardiac symptoms.  She did admit to some dietary discretions. => Was continued on torsemide  40 mg daily along with potassium supplementation due to hypokalemia.  (Taking 40 mg daily0 ->  Somewhere along the line, Lasix  was discontinued and she was placed on torsemide , and spironolactone  was  reinitiated.  Subjective  Discussed the use of AI scribe software for clinical note transcription with the patient, who gave verbal consent to proceed.  History of Present Illness History of Present Illness [name] is a 53 year old female who presents for follow-up regarding her current treatment regimen.  Swelling recurred around mid-May after discontinuation of Lasix . She is currently on torsemide  40 mg daily and spironolactone , noting improvement in swelling. She is scheduled to see wound care on Friday.  Significant weight loss from 276 pounds in April to 253 pounds currently, attributed to Ozempic  use. Clothing size reduced from 4X to 2X.  She is on a Eliquis  for previous blood clots. No epistaxis, hematuria, or melena. No chest pain, pressure, tightness, heart palpitations, dizziness, or wheezing.    Uses CPAP for sleep apnea, which aids sleep. Sleeps with seven pillows due to back issues and to elevate legs. Experiences dyspnea when lying flat.  However she really denies any significant worsening of anemia.  Not really having to PND orthopnea.  Current medications include amlodipine  2.5 mg daily for blood pressure and an iron supplement with breakfast.  Last echocardiogram in July 2024 showed normal heart function. A study in August 2024 showed no recurrent blood clots.  Plans to travel out of town from July 31 to August 4 to visit family, which she finds therapeutic.     Objective   Current Meds  Medication Sig   apixaban  (ELIQUIS ) 5  MG TABS tablet Take 1 tablet (5 mg total) by mouth 2 (two) times daily.   atorvastatin  (LIPITOR) 40 MG tablet Take 1 tablet (40 mg total) by mouth daily.   cholecalciferol  (VITAMIN D3) 25 MCG (1000 UNIT) tablet Take 1 tablet (1,000 Units total) by mouth daily.   docusate sodium  (COLACE) 100 MG capsule Take 1 capsule (100 mg total) by mouth 2 (two) times daily as needed for mild constipation.   FEROSUL 325 (65 Fe) MG tablet TAKE  ONE TABLET BY MOUTH DAILY WITH BREAKFAST   gabapentin  (NEURONTIN ) 400 MG capsule Take 1 capsule (400 mg total) by mouth 3 (three) times daily.   methocarbamol  (ROBAXIN ) 500 MG tablet Take 1 tablet (500 mg total) by mouth every 8 (eight) hours as needed for muscle spasms.   mometasone -formoterol  (DULERA ) 200-5 MCG/ACT AERO INHALE 2 PUFFS EVERY MORNING AND 2 PUFFS EVERY NIGHT AT BEDTIME   nitroGLYCERIN  (NITROSTAT ) 0.4 MG SL tablet DISSOLVE 1 TABLET UNDER THE TONGUE AS NEEDED FOR CHEST PAIN EVERY 5 MINUTES UP TO 3 TIMES. IF NO RELIEF CALL 911.    PARoxetine  (PAXIL ) 20 MG tablet Take 1 tablet (20 mg total) by mouth daily.   potassium chloride  SA (KLOR-CON  M) 20 MEQ tablet Take 2 tablets (40 mEq total) by mouth daily.   spironolactone  (ALDACTONE ) 25 MG tablet Take 1 tablet (25 mg total) by mouth daily.   torsemide  (DEMADEX ) 20 MG tablet Take 2 tablets (40 mg total) by mouth daily.   triamcinolone cream (KENALOG) 0.1 % Apply 1 Application topically daily as needed (skin irritation).   vitamin B-12 (CYANOCOBALAMIN ) 1000 MCG tablet Take 1,000 mcg by mouth daily.   [DISCONTINUED] amLODipine  (NORVASC ) 5 MG tablet Take 2.5 mg by mouth daily.   [DISCONTINUED] oxyCODONE  (OXY IR/ROXICODONE ) 5 MG immediate release tablet Take 1 tablet (5 mg total) by mouth 2 (two) times daily as needed for severe pain (pain score 7-10).   [DISCONTINUED] Semaglutide , 2 MG/DOSE, (OZEMPIC , 2 MG/DOSE,) 8 MG/3ML SOPN DIAL AND INJECT 2 MG UNDER THE SKIN ONCE WEEKLY => not able to afford.     Studies Reviewed: SABRA   EKG Interpretation Date/Time:  Monday October 20 2023 13:45:18 EDT Ventricular Rate:  85 PR Interval:  166 QRS Duration:  98 QT Interval:  388 QTC Calculation: 461 R Axis:   -18  Text Interpretation: Normal sinus rhythm Possible Left atrial enlargement Minimal voltage criteria for LVH, may be normal variant ( R in aVL ) ST no longer depressed in Inferolateral leads When compared with ECG of 10-Jul-2023 12:04, Confirmed  by Anner Lenis ([id number]) on 10/20/2023 2:02:28 PM    No recent studies Lab Results  Component Value Date   NA 140 10/16/2023   K 3.7 10/16/2023   CREATININE 0.73 10/16/2023   GFRNONAA >60 10/16/2023   GLUCOSE 86 10/16/2023   Lab Results  Component Value Date   CHOL 129 09/05/2022   HDL 49 09/05/2022   LDLCALC 66 09/05/2022   TRIG 70 09/05/2022   CHOLHDL 2.6 09/05/2022      Latest Ref Rng & Units 10/16/2023    1:38 PM 08/21/2023    2:25 PM 07/10/2023   12:09 PM  CBC  WBC 4.0 - 10.5 K/uL 6.5  4.6  3.9   Hemoglobin 12.0 - 15.0 g/dL 88.9  88.5  89.9   Hematocrit 36.0 - 46.0 % 36.4  39.4  33.4   Platelets 150 - 400 K/uL 161  119  137    Lab Results  Component Value  Date   HGBA1C 5.3 08/07/2023     DIAGNOSTIC Echocardiogram: Normal systolic function, normal diastolic function, normal valve function (10/2022) Blood clot evaluation: No recurrent thromboembolic events (91/7975)  Risk Assessment/Calculations:          Physical Exam:   VS:  BP 124/80 (BP Location: Left Arm, Patient Position: Sitting, Cuff Size: Large)   Pulse 85   Ht 5' 9 (1.753 m)   Wt 253 lb (114.8 kg)   SpO2 98%   BMI 37.36 kg/m    Wt Readings from Last 3 Encounters:  10/24/23 272 lb (123.4 kg)  10/20/23 253 lb (114.8 kg)  10/16/23 269 lb 11.2 oz (122.3 kg)    GEN: Well nourished, well developed in no acute distress; moderately obese  NECK: No JVD; No carotid bruits CARDIAC: Normal S1, S2; RRR, no murmurs, rubs, gallops RESPIRATORY:  Clear to auscultation without rales, wheezing or rhonchi ; nonlabored, good air movement. ABDOMEN: Soft, non-tender, non-distended EXTREMITIES:  BLE edema 2+ ; No deformity      ASSESSMENT AND PLAN: .    Problem List Items Addressed This Visit       Cardiology Problems   (HFpEF) heart failure with preserved ejection fraction (HCC) -> although echo suggests normal diastolic parameters with normal left atrial size - Primary (Chronic)   I am still not convinced  that she truly has CHF.  She had weight gain and edema but no real significant dyspnea that I can tell.  She sleeps on multiple pillows despite having lost weight and having no significant edema now.  I think that is more related to her underlying body habitus and sleep apnea.  - She is no longer on any afterload reducing agent.  Is only on very low-dose amlodipine  which I think we can stop because of her ongoing edema issues that are probably more concerning for lower extremity venous stasis.  Edema improved with torsemide  40 mg daily and spironolactone  25 mg daily, and with weight loss from Ozempic  and lifestyle changes. - Farxiga  discontinued due to recurrent UTIs and yeast infections.=> Would not challenge again with SGLT2 inhibitor. - Continue torsemide  and spironolactone  at current dose, but did advise her that she could use extra 20 mg torsemide  if swelling increases.      Essential hypertension (Chronic)   Relevant Orders   EKG 12-Lead (Completed)   Hyperlipidemia associated with type 2 diabetes mellitus (HCC) (Chronic)   From last labs in May 2024, her LDL was 66.  Have not seen recent labs but should be due for labs repeated by PCP.Type 2 Diabetes Mellitus well-controlled, A1c at 5.3, improved with weight loss.  -Continue atorvastatin  4 mg daily. - Continue Ozempic  which is helping with weight loss, BP control and diabetes along with overall general CV risk reduction.      Venous insufficiency (Chronic)   I truly think that her swelling is more related to venous stasis from history of DVT as well as obesity and OSA.  Previous IVC in place.  Echocardiographic data has not suggested significant arterial for CHF.  I recommended support stockings in the past, but she is not wearing. Also continue to recommend foot elevation. Previously on sliding scale Lasix  but now on standing dose of torsemide  and spironolactone .        Other   History of DVT (deep vein thrombosis)   Deep vein  thrombosis managed with anticoagulation, no complications. - Continue anticoagulation therapy. => Defer to PCP but should be okay to hold Eliquis   2 to 3 days preop for surgeries or procedures.      Lymphedema (Chronic)   I agree that her edema is probably lymphedema related.  See venous stasis for details.      Morbid obesity with BMI of 50.0-59.9, adult (HCC) (Chronic)   Obesity with 23 lbs weight loss due to Ozempic  and lifestyle changes, improving blood pressure and edema. - Continue Ozempic  for weight management. -Monitor volume status closely.  Somewhat concerning for dehydration with being on a standing dose diuretic and Ozempic .      OSA (obstructive sleep apnea) (Chronic)   Continue to recommend use of CPAP               Follow-Up: Return in about 1 year (around 10/19/2024) for [employer], Alternating annual follow-ups APP and MD.     Signed, Alm MICAEL Clay, MD, MS Alm Clay, M.D., M.S. Interventional Chartered certified accountant  Pager # [phone]

## 2023-10-20 NOTE — Patient Instructions (Signed)
 Medication Instructions:  No changes  *If you need a refill on your cardiac medications before your next appointment, please call your pharmacy*   Lab Work: Not needed    Testing/Procedures: Not needed   Follow-Up: At [facility], you and your health needs are our priority.  As part of our continuing mission to provide you with exceptional heart care, we have created designated Provider Care Teams.  These Care Teams include your primary Cardiologist (physician) and Advanced Practice Providers (APPs -  Physician Assistants and Nurse Practitioners) who all work together to provide you with the care you need, when you need it.     Your next appointment:   12 month(s)  The format for your next appointment:   In Person  Provider:   Rollo Louder, PA-C

## 2023-10-22 ENCOUNTER — Encounter (HOSPITAL_BASED_OUTPATIENT_CLINIC_OR_DEPARTMENT_OTHER): Admitting: General Surgery

## 2023-10-22 ENCOUNTER — Encounter: Payer: Self-pay | Admitting: Hematology

## 2023-10-22 ENCOUNTER — Ambulatory Visit (HOSPITAL_BASED_OUTPATIENT_CLINIC_OR_DEPARTMENT_OTHER): Admitting: Internal Medicine

## 2023-10-22 DIAGNOSIS — I872 Venous insufficiency (chronic) (peripheral): Secondary | ICD-10-CM | POA: Diagnosis not present

## 2023-10-22 DIAGNOSIS — E11622 Type 2 diabetes mellitus with other skin ulcer: Secondary | ICD-10-CM | POA: Diagnosis not present

## 2023-10-22 DIAGNOSIS — L97822 Non-pressure chronic ulcer of other part of left lower leg with fat layer exposed: Secondary | ICD-10-CM | POA: Diagnosis not present

## 2023-10-24 ENCOUNTER — Ambulatory Visit: Attending: Internal Medicine | Admitting: Internal Medicine

## 2023-10-24 ENCOUNTER — Other Ambulatory Visit: Payer: Self-pay

## 2023-10-24 ENCOUNTER — Encounter: Payer: Self-pay | Admitting: Internal Medicine

## 2023-10-24 VITALS — BP 113/81 | HR 87 | Temp 98.2°F | Ht 69.0 in | Wt 272.0 lb

## 2023-10-24 DIAGNOSIS — Z760 Encounter for issue of repeat prescription: Secondary | ICD-10-CM

## 2023-10-24 DIAGNOSIS — Z7985 Long-term (current) use of injectable non-insulin antidiabetic drugs: Secondary | ICD-10-CM

## 2023-10-24 DIAGNOSIS — E1169 Type 2 diabetes mellitus with other specified complication: Secondary | ICD-10-CM | POA: Diagnosis not present

## 2023-10-24 DIAGNOSIS — S81802A Unspecified open wound, left lower leg, initial encounter: Secondary | ICD-10-CM | POA: Diagnosis not present

## 2023-10-24 DIAGNOSIS — Z6841 Body Mass Index (BMI) 40.0 and over, adult: Secondary | ICD-10-CM

## 2023-10-24 MED ORDER — OZEMPIC (2 MG/DOSE) 8 MG/3ML ~~LOC~~ SOPN: PEN_INJECTOR | SUBCUTANEOUS | 4 refills | Status: DC

## 2023-10-24 MED ORDER — ACETAMINOPHEN 500 MG PO TABS: 500.0000 mg | ORAL_TABLET | Freq: Three times a day (TID) | ORAL | 0 refills | Status: DC | PRN

## 2023-10-24 MED ORDER — AMLODIPINE BESYLATE 2.5 MG PO TABS: 2.5000 mg | ORAL_TABLET | Freq: Every day | ORAL | 1 refills | Status: DC

## 2023-10-24 NOTE — Patient Instructions (Signed)
 I am sent prescription to your pharmacy for refill on the Ozempic  and amlodipine .  You should still have refill on the Paxil .    I have sent a prescription for Tylenol  500 mg for you to use 3 times a day as needed.

## 2023-10-24 NOTE — Progress Notes (Signed)
 Patient ID: [name], female    DOB: December 29, 1970  MRN: [id number]  CC: Leg Pain (L leg pain X2 weeks - requesting pain med for travel on 7/15)   Subjective: Lakrista Scaduto is a 53 y.o. 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  EGD and colonoscopy negative, chronic ulcer left lower extremity,    Discussed the use of AI scribe software for clinical note transcription with the patient, who gave verbal consent to proceed.  History of Present Illness [name] is a 53 year old female who presents for medication refills and pain management.  Patient is requesting refill on oxycodone  which we had prescribed a few times this spring for rib cage pain from fractured ribs.  Now that the ribs have healed, she would like a refill again on oxycodone  for pain in the left leg.  States that she has a wound that has opened for which she is seeing the wound clinic and getting leg wraps.  She will be traveling via car to [place]  on the 15th of next month. .  She experiences increased anxiety and difficulty sleeping due to her mother's illness and requests a Paxil  refill.  She still has refills on the last prescription that was written but was not aware of it.  She is on Ozempic , with no refills remaining, and takes a 2 mg dose, which she tolerates well. She has lost 15 pounds since November. She also requires a refill of amlodipine  2.5 mg, which she is currently taking as one of her blood pressure medicines.    Patient Active Problem List   Diagnosis Date Noted   Abdominal pain in female patient 06/24/2023   Peripheral edema 01/16/2023    Chronic diastolic CHF (congestive heart failure) (HCC) 01/16/2023   Lack of motivation 01/08/2023   Anasarca 01/07/2023   Orthostatic dizziness 11/27/2022   Vertigo 11/26/2022   Type 2 diabetes mellitus with morbid obesity (HCC) 09/18/2022   Moderate major depression (HCC) 05/07/2022   Hyperlipidemia with target LDL less than 100 02/17/2022   Pain in thoracic spine 09/25/2021   Low back pain 09/25/2021   OSA (obstructive sleep apnea) 07/11/2021   Depression 05/28/2021   Morbid obesity with BMI of 50.0-59.9, adult (HCC) 05/26/2021   Bilateral lower extremity edema 11/22/2020   History of DVT (deep vein thrombosis) 11/22/2020   Asthma, chronic, moderate persistent, with acute exacerbation 11/22/2020   Lymphedema 06/09/2020   Iron deficiency anemia due to chronic blood loss 05/10/2020   Venous insufficiency 04/13/2020   S/P insertion of IVC (inferior vena caval) filter 04/13/2020   Chronic anemia 04/13/2020   Essential hypertension 04/13/2020   (HFpEF) heart failure with preserved ejection fraction (HCC) -> although echo suggests normal diastolic parameters with normal left atrial size 04/13/2020   CAD (coronary artery disease) 11/17/2019     Current Outpatient Medications on File Prior to Visit  Medication Sig Dispense Refill   amLODipine  (NORVASC ) 5 MG tablet Take 2.5 mg by mouth daily.     apixaban  (ELIQUIS ) 5 MG TABS tablet Take 1 tablet (5 mg total) by mouth 2 (two) times daily. 180 tablet 1   atorvastatin  (LIPITOR) 40 MG tablet  Take 1 tablet (40 mg total) by mouth daily. 90 tablet 1   cholecalciferol  (VITAMIN D3) 25 MCG (1000 UNIT) tablet Take 1 tablet (1,000 Units total) by mouth daily. 30 tablet 5   docusate sodium  (COLACE) 100 MG capsule Take 1 capsule (100 mg total) by mouth 2 (two) times daily as needed for mild constipation.     FEROSUL 325 (65 Fe) MG tablet TAKE ONE TABLET BY MOUTH DAILY WITH BREAKFAST 100 tablet 0   gabapentin  (NEURONTIN ) 400 MG capsule Take 1 capsule  (400 mg total) by mouth 3 (three) times daily. 270 capsule 1   methocarbamol  (ROBAXIN ) 500 MG tablet Take 1 tablet (500 mg total) by mouth every 8 (eight) hours as needed for muscle spasms. 60 tablet 0   mometasone -formoterol  (DULERA ) 200-5 MCG/ACT AERO INHALE 2 PUFFS EVERY MORNING AND 2 PUFFS EVERY NIGHT AT BEDTIME 13 g 11   nitroGLYCERIN  (NITROSTAT ) 0.4 MG SL tablet DISSOLVE 1 TABLET UNDER THE TONGUE AS NEEDED FOR CHEST PAIN EVERY 5 MINUTES UP TO 3 TIMES. IF NO RELIEF CALL 911. (Patient taking differently: Place 0.4 mg under the tongue every 5 (five) minutes as needed for chest pain.) 25 tablet 2   oxyCODONE  (OXY IR/ROXICODONE ) 5 MG immediate release tablet Take 1 tablet (5 mg total) by mouth 2 (two) times daily as needed for severe pain (pain score 7-10). 20 tablet 0   PARoxetine  (PAXIL ) 20 MG tablet Take 1 tablet (20 mg total) by mouth daily. 30 tablet 6   potassium chloride  SA (KLOR-CON  M) 20 MEQ tablet Take 2 tablets (40 mEq total) by mouth daily. 180 tablet 2   Semaglutide , 2 MG/DOSE, (OZEMPIC , 2 MG/DOSE,) 8 MG/3ML SOPN DIAL AND INJECT 2 MG UNDER THE SKIN ONCE WEEKLY 3 mL 4   triamcinolone cream (KENALOG) 0.1 % Apply 1 Application topically daily as needed (skin irritation).     vitamin B-12 (CYANOCOBALAMIN ) 1000 MCG tablet Take 1,000 mcg by mouth daily.     spironolactone  (ALDACTONE ) 25 MG tablet Take 1 tablet (25 mg total) by mouth daily. 30 tablet 0   torsemide  (DEMADEX ) 20 MG tablet Take 2 tablets (40 mg total) by mouth daily. 180 tablet 1   No current facility-administered medications on file prior to visit.    Allergies  Allergen Reactions   Ace Inhibitors Rash    Make pt bleed   Hydromorphone Hives and Itching   Vancomycin Itching and Rash   Contrast Media [Iodinated Contrast Media] Hives   Dilaudid [Hydromorphone Hcl] Hives   Farxiga  [Dapagliflozin ] Other (See Comments)    Vaginal yeast   Lidocaine  Itching    Itching with Lidocaine  patch reported 06/13/2021 vis telephone  message.    Social History   Socioeconomic History   Marital status: Single    Spouse name: Not on file   Number of children: 0   Years of education: Not on file   Highest education level: Some college, no degree  Occupational History   Occupation: unemployed on disablity  Tobacco Use   Smoking status: Former    Current packs/day: 0.00    Average packs/day: 0.3 packs/day for 18.0 years (4.5 ttl pk-yrs)    Types: Cigarettes    Start date: 04/29/1984    Quit date: 04/29/2002    Years since quitting: 21.5   Smokeless tobacco: Never  Vaping Use   Vaping status: Never Used  Substance and Sexual Activity   Alcohol use: Not Currently   Drug use: Not Currently   Sexual activity: Not Currently  Birth control/protection: Condom  Other Topics Concern   Not on file  Social History Narrative   Not on file   Social Drivers of Health   Financial Resource Strain: Low Risk  (10/20/2023)   Overall Financial Resource Strain (CARDIA)    Difficulty of Paying Living Expenses: Not hard at all  Food Insecurity: No Food Insecurity (10/20/2023)   Hunger Vital Sign    Worried About Running Out of Food in the Last Year: Never true    Ran Out of Food in the Last Year: Never true  Transportation Needs: No Transportation Needs (10/20/2023)   PRAPARE - Administrator, Civil Service (Medical): No    Lack of Transportation (Non-Medical): No  Physical Activity: Inactive (10/20/2023)   Exercise Vital Sign    Days of Exercise per Week: 0 days    Minutes of Exercise per Session: Not on file  Stress: No Stress Concern Present (10/20/2023)   [employer] of Occupational Health - Occupational Stress Questionnaire    Feeling of Stress: Only a little  Social Connections: Moderately Isolated (10/20/2023)   Social Connection and Isolation Panel    Frequency of Communication with Friends and Family: More than three times a week    Frequency of Social Gatherings with Friends and Family: Twice a  week    Attends Religious Services: 1 to 4 times per year    Active Member of [employer] or Organizations: No    Attends Engineer, structural: Not on file    Marital Status: Never married  Intimate Partner Violence: Not At Risk (03/06/2023)   Humiliation, Afraid, Rape, and Kick questionnaire    Fear of Current or Ex-Partner: No    Emotionally Abused: No    Physically Abused: No    Sexually Abused: No    Family History  Problem Relation Age of Onset   Diabetes Mellitus II Mother    Diabetes Mother    COPD Father    Diabetes Father    Colon cancer Father    Diabetes Mellitus II Maternal Grandmother    Breast cancer Paternal Grandfather    Liver disease Neg Hx    Esophageal cancer Neg Hx     Past Surgical History:  Procedure Laterality Date   ABDOMINAL WALL DEFECT REPAIR  1970   EYE SURGERY     When i was born   IR CV LINE INJECTION  10/24/2020   IR REMOVAL TUN ACCESS W/ PORT W/O FL MOD SED  05/28/2021   IVC FILTER INSERTION  2017   Lower Extremity Venous Duplex  06/23/2020   No evidence of DVT or superficial thrombosis bilaterally.  No evidence of deep venous insufficiency bilaterally.  No evidence of SSV reflux.  Right GSV in the calf has reflux, no reflux in L GSV.;  Repeated in July 2020-no DVT   OOPHORECTOMY  1996   OOPHORECTOMY  1997   PORTACATH PLACEMENT  2014   TRANSTHORACIC ECHOCARDIOGRAM  05/18/2021   EF 60 to 65%.  No RWMA.  Mild concentric LVH.  Normal diastolic parameters .  Normal longitudinal strain.  Normal PAP, RAP.  Normal aortic and mitral valves.==> In July 2022, echo read as GR 1 DD otherwise stable.    ROS: Review of Systems Negative except as stated above  PHYSICAL EXAM: BP 113/81 (BP Location: Left Arm, Patient Position: Sitting, Cuff Size: Large)   Pulse 87   Temp 98.2 F (36.8 C) (Oral)   Ht 5' 9 (1.753 m)   Wt  272 lb (123.4 kg)   SpO2 94%   BMI 40.17 kg/m   Wt Readings from Last 3 Encounters:  10/24/23 272 lb (123.4 kg)   10/20/23 253 lb (114.8 kg)  10/16/23 269 lb 11.2 oz (122.3 kg)    Physical Exam  General appearance - alert, well appearing, and in no distress Mental status - normal mood, behavior, speech, dress, motor activity, and thought processes Legs: Left leg is currently wrapped from where she was seen at the wound clinic yesterday     Latest Ref Rng & Units 10/16/2023    1:38 PM 08/21/2023    2:25 PM 07/10/2023   12:09 PM  CMP  Glucose 70 - 99 mg/dL 86  83  95   BUN 6 - 20 mg/dL 12  9  8    Creatinine 0.44 - 1.00 mg/dL 9.26  9.38  9.27   Sodium 135 - 145 mmol/L 140  138  138   Potassium 3.5 - 5.1 mmol/L 3.7  4.0  4.0   Chloride 98 - 111 mmol/L 104  102  104   CO2 22 - 32 mmol/L 29  27  27    Calcium  8.9 - 10.3 mg/dL 9.6  9.5  9.2   Total Protein 6.5 - 8.1 g/dL 8.0   6.9   Total Bilirubin 0.0 - 1.2 mg/dL 0.8   1.1   Alkaline Phos 38 - 126 U/L 82   58   AST 15 - 41 U/L 10   15   ALT 0 - 44 U/L 5   10    Lipid Panel     Component Value Date/Time   CHOL 129 09/05/2022 1438   TRIG 70 09/05/2022 1438   HDL 49 09/05/2022 1438   CHOLHDL 2.6 09/05/2022 1438   LDLCALC 66 09/05/2022 1438    CBC    Component Value Date/Time   WBC 6.5 10/16/2023 1338   WBC 4.6 08/21/2023 1425   RBC 4.04 10/16/2023 1338   HGB 11.0 (L) 10/16/2023 1338   HGB 12.3 01/31/2022 1341   HCT 36.4 10/16/2023 1338   HCT 38.8 01/31/2022 1341   PLT 161 10/16/2023 1338   PLT 109 (L) 01/31/2022 1341   MCV 90.1 10/16/2023 1338   MCV 87 01/31/2022 1341   MCH 27.2 10/16/2023 1338   MCHC 30.2 10/16/2023 1338   RDW 14.7 10/16/2023 1338   RDW 12.3 01/31/2022 1341   LYMPHSABS 0.7 10/16/2023 1338   LYMPHSABS 0.5 (L) 11/29/2020 1445   MONOABS 0.5 10/16/2023 1338   EOSABS 0.0 10/16/2023 1338   EOSABS 0.0 11/29/2020 1445   BASOSABS 0.0 10/16/2023 1338   BASOSABS 0.0 11/29/2020 1445    ASSESSMENT AND PLAN: 1. Encounter for medication refill (Primary)   2. Wound of left lower extremity, initial encounter Advised  patient that we prescribed the Percocet for her over the past 2 months as a short-term measure with the understanding that we were not going to continue it long-term.  It has been 2 months and her ribs have healed.  She is now requesting the oxycodone  for leg pain.  Advise using extra strength Tylenol .  Initially patient states that she is allergic to Tylenol  because it is an ACE inhibitor.  I told her that it is not an ACE inhibitor.  It is also not an NSAID. - acetaminophen  (TYLENOL ) 500 MG tablet; Take 1 tablet (500 mg total) by mouth every 8 (eight) hours as needed.  Dispense: 60 tablet; Refill: 0  3. Type 2 diabetes  mellitus with morbid obesity (HCC) Refill sent on Ozempic .  Commended her on weight loss so far. - Semaglutide , 2 MG/DOSE, (OZEMPIC , 2 MG/DOSE,) 8 MG/3ML SOPN; DIAL AND INJECT 2 MG UNDER THE SKIN ONCE WEEKLY  Dispense: 3 mL; Refill: 4  Patient was given the opportunity to ask questions.  Patient verbalized understanding of the plan and was able to repeat key elements of the plan.   This documentation was completed using Paediatric nurse.  Any transcriptional errors are unintentional.  No orders of the defined types were placed in this encounter.    Requested Prescriptions   Pending Prescriptions Disp Refills   amLODipine  (NORVASC ) 5 MG tablet      Sig: Take 0.5 tablets (2.5 mg total) by mouth daily.   methocarbamol  (ROBAXIN ) 500 MG tablet 60 tablet 0    Sig: Take 1 tablet (500 mg total) by mouth every 8 (eight) hours as needed for muscle spasms.   nitroGLYCERIN  (NITROSTAT ) 0.4 MG SL tablet 25 tablet 2    No follow-ups on file.  Barnie Louder, MD, FACP

## 2023-10-26 NOTE — Progress Notes (Incomplete)
 Cardiology Office Note:  .   Date:  10/20/2023  ID:  [name], DOB 02/05/1971, MRN [id number] PCP: Megan Barnie NOVAK, MD  [facility] Providers Cardiologist:  Alm Clay, MD { Click to update primary MD,subspecialty MD or APP then REFRESH:1}    No chief complaint on file.   Patient Profile: .     [name] is a moderately obese 53 y.o. female  with a PMH notable for DVT, OSA, type 2 diabetes, asthma/COPD, morbid obesity, hypertension, HFpEF. who presents here for 5-6 month at the request of Megan Barnie NOVAK, MD.  CV History: July 2022: EF showed mild LVH and GR 1 DD. =>  Diagnosed with Hypertensive Heart Disease ?  DVT.  On long-term Eliquis .       [name] admitted with dizziness and vertigo in late July 2024.  Normal Echo.  Lasix  was held with possibly this being orthostasis. => Discharged to SNF and admitted in September 2024 with worsening lower extremity weight gain thought to relate to A-on-C HFpEF => she was diuresed 40 pounds when discharged back to SNF on torsemide  40 Miller daily, spironolactone  25 mg daily and Farxiga  10 mg. => Felt to be NYHA class IIIb CHF?  Was not getting her CPAP at the facility.  was last seen by Rollo Louder, PA on 05/29/2023 as a 3-month follow-up to revisit.  In October patient denied any symptoms.  Recommended PT.  In the January visit she evidence of daily cardiac symptoms was more worried about vaginal yeast infections recovering sex over the last year.  Both spironolactone  and Farxiga  had been stopped with some improvement.  No active cardiac symptoms.  She did admit to some dietary discretions. => Was continued on torsemide  40 mg daily along with potassium supplementation due to hypokalemia.  (Taking 40 mg daily  Subjective  Discussed the use of AI scribe software for clinical note transcription with the patient, who gave verbal consent to proceed.  History of Present  Illness History of Present Illness [name] is a 53 year old female who presents for follow-up regarding her current treatment regimen.  Swelling recurred around mid-May after discontinuation of Lasix . She is currently on torsemide  40 mg daily and spironolactone , noting improvement in swelling. She is scheduled to see wound care on Friday.  Significant weight loss from 276 pounds in April to 253 pounds currently, attributed to Ozempic  use. Clothing size reduced from 4X to 2X.  She is on a blood thinner for previous blood clots. No epistaxis, hematuria, or melena. No chest pain, pressure, tightness, heart palpitations, dizziness, or wheezing.  Uses CPAP for sleep apnea, which aids sleep. Sleeps with seven pillows due to back issues and to elevate legs. Experiences dyspnea when lying flat.  Current medications include amlodipine  2.5 mg daily for blood pressure and an iron supplement with breakfast.  Last echocardiogram in July 2024 showed normal heart function. A study in August 2024 showed no recurrent blood clots.  Plans to travel out of town from July 31 to August 4 to visit family, which she finds therapeutic.     Objective   Current Meds  Medication Sig  . apixaban  (ELIQUIS ) 5 MG TABS tablet Take 1 tablet (5 mg total) by mouth 2 (two) times daily.  . atorvastatin  (LIPITOR) 40 MG tablet Take 1 tablet (40 mg total) by mouth daily.  . cholecalciferol  (VITAMIN D3) 25 MCG (1000 UNIT) tablet Take 1 tablet (1,000 Units total) by mouth daily.  SABRA  docusate sodium  (COLACE) 100 MG capsule Take 1 capsule (100 mg total) by mouth 2 (two) times daily as needed for mild constipation.  . FEROSUL 325 (65 Fe) MG tablet TAKE ONE TABLET BY MOUTH DAILY WITH BREAKFAST  . gabapentin  (NEURONTIN ) 400 MG capsule Take 1 capsule (400 mg total) by mouth 3 (three) times daily.  . methocarbamol  (ROBAXIN ) 500 MG tablet Take 1 tablet (500 mg total) by mouth every 8 (eight) hours as needed for muscle  spasms.  . mometasone -formoterol  (DULERA ) 200-5 MCG/ACT AERO INHALE 2 PUFFS EVERY MORNING AND 2 PUFFS EVERY NIGHT AT BEDTIME  . nitroGLYCERIN  (NITROSTAT ) 0.4 MG SL tablet DISSOLVE 1 TABLET UNDER THE TONGUE AS NEEDED FOR CHEST PAIN EVERY 5 MINUTES UP TO 3 TIMES. IF NO RELIEF CALL 911.   . PARoxetine  (PAXIL ) 20 MG tablet Take 1 tablet (20 mg total) by mouth daily.  . potassium chloride  SA (KLOR-CON  M) 20 MEQ tablet Take 2 tablets (40 mEq total) by mouth daily.  . spironolactone  (ALDACTONE ) 25 MG tablet Take 1 tablet (25 mg total) by mouth daily.  . torsemide  (DEMADEX ) 20 MG tablet Take 2 tablets (40 mg total) by mouth daily.  SABRA triamcinolone cream (KENALOG) 0.1 % Apply 1 Application topically daily as needed (skin irritation).  . vitamin B-12 (CYANOCOBALAMIN ) 1000 MCG tablet Take 1,000 mcg by mouth daily.  . [DISCONTINUED] amLODipine  (NORVASC ) 5 MG tablet Take 2.5 mg by mouth daily.  . [DISCONTINUED] oxyCODONE  (OXY IR/ROXICODONE ) 5 MG immediate release tablet Take 1 tablet (5 mg total) by mouth 2 (two) times daily as needed for severe pain (pain score 7-10).  . [DISCONTINUED] Semaglutide , 2 MG/DOSE, (OZEMPIC , 2 MG/DOSE,) 8 MG/3ML SOPN DIAL AND INJECT 2 MG UNDER THE SKIN ONCE WEEKLY => not able to afford.     Studies Reviewed: SABRA        No recent studies Lab Results  Component Value Date   NA 140 10/16/2023   K 3.7 10/16/2023   CREATININE 0.73 10/16/2023   GFRNONAA >60 10/16/2023   GLUCOSE 86 10/16/2023   Lab Results  Component Value Date   CHOL 129 09/05/2022   HDL 49 09/05/2022   LDLCALC 66 09/05/2022   TRIG 70 09/05/2022   CHOLHDL 2.6 09/05/2022      Latest Ref Rng & Units 10/16/2023    1:38 PM 08/21/2023    2:25 PM 07/10/2023   12:09 PM  CBC  WBC 4.0 - 10.5 K/uL 6.5  4.6  3.9   Hemoglobin 12.0 - 15.0 g/dL 88.9  88.5  89.9   Hematocrit 36.0 - 46.0 % 36.4  39.4  33.4   Platelets 150 - 400 K/uL 161  119  137     Risk Assessment/Calculations:           Physical Exam:    VS:  BP 124/80 (BP Location: Left Arm, Patient Position: Sitting, Cuff Size: Large)   Pulse 85   Ht 5' 9 (1.753 m)   Wt 253 lb (114.8 kg)   SpO2 98%   BMI 37.36 kg/m    Wt Readings from Last 3 Encounters:  10/20/23 253 lb (114.8 kg)  10/16/23 269 lb 11.2 oz (122.3 kg)  08/07/23 276 lb (125.2 kg)    GEN: Well nourished, well developed in no acute distress; moderately obese  NECK: No JVD; No carotid bruits CARDIAC: Normal S1, S2; RRR, no murmurs, rubs, gallops RESPIRATORY:  Clear to auscultation without rales, wheezing or rhonchi ; nonlabored, good air movement. ABDOMEN: Soft, non-tender, non-distended  EXTREMITIES:  BLE edema 2+ ; No deformity      ASSESSMENT AND PLAN: .    Problem List Items Addressed This Visit       Cardiology Problems   Essential hypertension - Primary (Chronic)   Relevant Orders   EKG 12-Lead (Completed)    Assessment and Plan Assessment & Plan        {Are you ordering a CV Procedure (e.g. stress test, cath, DCCV, TEE, etc)?   Press F2        :[id number]}   Follow-Up: No follow-ups on file.  Total time spent: *** min spent with patient + *** min spent charting = *** min    Signed, Alm MICAEL Clay, MD, MS Alm Clay, M.D., M.S. Interventional Chartered certified accountant  Pager # [phone]

## 2023-10-27 ENCOUNTER — Encounter: Payer: Self-pay | Admitting: Cardiology

## 2023-10-27 NOTE — Assessment & Plan Note (Signed)
 From last labs in May 2024, her LDL was 66.  Have not seen recent labs but should be due for labs repeated by PCP.Type 2 Diabetes Mellitus well-controlled, A1c at 5.3, improved with weight loss.  -Continue atorvastatin  4 mg daily. - Continue Ozempic  which is helping with weight loss, BP control and diabetes along with overall general CV risk reduction.

## 2023-10-27 NOTE — Assessment & Plan Note (Signed)
 Deep vein thrombosis managed with anticoagulation, no complications. - Continue anticoagulation therapy. => Defer to PCP but should be okay to hold Eliquis  2 to 3 days preop for surgeries or procedures.

## 2023-10-27 NOTE — Assessment & Plan Note (Signed)
 I truly think that her swelling is more related to venous stasis from history of DVT as well as obesity and OSA.  Previous IVC in place.  Echocardiographic data has not suggested significant arterial for CHF.  I recommended support stockings in the past, but she is not wearing. Also continue to recommend foot elevation. Previously on sliding scale Lasix  but now on standing dose of torsemide  and spironolactone .

## 2023-10-27 NOTE — Assessment & Plan Note (Signed)
 Continue to recommend use of CPAP

## 2023-10-27 NOTE — Assessment & Plan Note (Signed)
 Obesity with 23 lbs weight loss due to Ozempic  and lifestyle changes, improving blood pressure and edema. - Continue Ozempic  for weight management. -Monitor volume status closely.  Somewhat concerning for dehydration with being on a standing dose diuretic and Ozempic .

## 2023-10-27 NOTE — Assessment & Plan Note (Signed)
 I agree that her edema is probably lymphedema related.  See venous stasis for details.

## 2023-10-27 NOTE — Assessment & Plan Note (Signed)
 I am still not convinced that she truly has CHF.  She had weight gain and edema but no real significant dyspnea that I can tell.  She sleeps on multiple pillows despite having lost weight and having no significant edema now.  I think that is more related to her underlying body habitus and sleep apnea.  - She is no longer on any afterload reducing agent.  Is only on very low-dose amlodipine  which I think we can stop because of her ongoing edema issues that are probably more concerning for lower extremity venous stasis.  Edema improved with torsemide  40 mg daily and spironolactone  25 mg daily, and with weight loss from Ozempic  and lifestyle changes. - Farxiga  discontinued due to recurrent UTIs and yeast infections.=> Would not challenge again with SGLT2 inhibitor. - Continue torsemide  and spironolactone  at current dose, but did advise her that she could use extra 20 mg torsemide  if swelling increases.

## 2023-10-29 ENCOUNTER — Encounter (HOSPITAL_BASED_OUTPATIENT_CLINIC_OR_DEPARTMENT_OTHER): Attending: General Surgery | Admitting: General Surgery

## 2023-10-29 DIAGNOSIS — L97822 Non-pressure chronic ulcer of other part of left lower leg with fat layer exposed: Secondary | ICD-10-CM | POA: Insufficient documentation

## 2023-10-29 DIAGNOSIS — E11622 Type 2 diabetes mellitus with other skin ulcer: Secondary | ICD-10-CM | POA: Diagnosis present

## 2023-10-29 DIAGNOSIS — L98492 Non-pressure chronic ulcer of skin of other sites with fat layer exposed: Secondary | ICD-10-CM | POA: Insufficient documentation

## 2023-10-29 DIAGNOSIS — I890 Lymphedema, not elsewhere classified: Secondary | ICD-10-CM | POA: Diagnosis not present

## 2023-10-29 DIAGNOSIS — I5030 Unspecified diastolic (congestive) heart failure: Secondary | ICD-10-CM | POA: Diagnosis not present

## 2023-10-29 DIAGNOSIS — I872 Venous insufficiency (chronic) (peripheral): Secondary | ICD-10-CM | POA: Insufficient documentation

## 2023-11-05 ENCOUNTER — Encounter (HOSPITAL_BASED_OUTPATIENT_CLINIC_OR_DEPARTMENT_OTHER): Admitting: General Surgery

## 2023-11-05 DIAGNOSIS — E11622 Type 2 diabetes mellitus with other skin ulcer: Secondary | ICD-10-CM | POA: Diagnosis not present

## 2023-11-05 DIAGNOSIS — I890 Lymphedema, not elsewhere classified: Secondary | ICD-10-CM | POA: Diagnosis not present

## 2023-11-05 DIAGNOSIS — I872 Venous insufficiency (chronic) (peripheral): Secondary | ICD-10-CM | POA: Diagnosis not present

## 2023-11-05 DIAGNOSIS — L98492 Non-pressure chronic ulcer of skin of other sites with fat layer exposed: Secondary | ICD-10-CM | POA: Diagnosis not present

## 2023-11-05 DIAGNOSIS — L97822 Non-pressure chronic ulcer of other part of left lower leg with fat layer exposed: Secondary | ICD-10-CM | POA: Diagnosis not present

## 2023-11-06 ENCOUNTER — Other Ambulatory Visit: Payer: Self-pay | Admitting: Internal Medicine

## 2023-11-06 DIAGNOSIS — S20212S Contusion of left front wall of thorax, sequela: Secondary | ICD-10-CM

## 2023-11-06 NOTE — Telephone Encounter (Signed)
 Copied from CRM [id number]. Topic: Clinical - Medication Refill >> Nov 06, 2023  3:18 PM Silvana PARAS wrote: Medication: methocarbamol  (ROBAXIN ) 500 MG tablet  Has the patient contacted their pharmacy? Yes (Agent: If no, request that the patient contact the pharmacy for the refill. If patient does not wish to contact the pharmacy document the reason why and proceed with request.) (Agent: If yes, when and what did the pharmacy advise?)  This is the patient's preferred pharmacy:  [facility] [id number] - [place], [place] - [address] [place] [address] Phone: [phone] Fax: [phone]  Is this the correct pharmacy for this prescription? Yes If no, delete pharmacy and type the correct one.   Has the prescription been filled recently? No  Is the patient out of the medication? Yes  Has the patient been seen for an appointment in the last year OR does the patient have an upcoming appointment? Yes  Can we respond through MyChart? Yes  Agent: Please be advised that Rx refills may take up to 3 business days. We ask that you follow-up with your pharmacy.

## 2023-11-07 NOTE — Telephone Encounter (Signed)
 Requested medication (s) are due for refill today: Yes  Requested medication (s) are on the active medication list: Yes  Last refill:  09/24/23  Future visit scheduled: Yes  Notes to clinic:  Unable to refill per protocol, cannot delegate.      Requested Prescriptions  Pending Prescriptions Disp Refills   methocarbamol  (ROBAXIN ) 500 MG tablet 60 tablet 0    Sig: Take 1 tablet (500 mg total) by mouth every 8 (eight) hours as needed for muscle spasms.     Not Delegated - Analgesics:  Muscle Relaxants Failed - 11/07/2023  5:21 PM      Failed - This refill cannot be delegated      Passed - Valid encounter within last 6 months    Recent Outpatient Visits           2 weeks ago Encounter for medication refill   [facility] Dept [facility] [place]. [facility] Vicci Sober B, [place]   3 months ago Type 2 diabetes mellitus with morbid obesity [facility])   [facility] [facility] - [facility] Dept Of [place]. [facility] Vicci Sober NOVAK, MD   3 months ago Rib contusion, left, sequela   [facility] - A Dept Of [place]. [facility], Jon M, [place]   6 months ago Yeast infection    [facility] - [facility] Dept Of [place]. [facility], Jon HERO, [place]   8 months ago Hospital discharge follow-up   [facility] - A Dept Of [place]. [facility] Vicci Sober NOVAK, MD

## 2023-11-10 MED ORDER — METHOCARBAMOL 500 MG PO TABS: 500.0000 mg | ORAL_TABLET | Freq: Two times a day (BID) | ORAL | 0 refills | Status: DC | PRN

## 2023-11-14 ENCOUNTER — Encounter (HOSPITAL_BASED_OUTPATIENT_CLINIC_OR_DEPARTMENT_OTHER): Admitting: General Surgery

## 2023-11-14 DIAGNOSIS — E11622 Type 2 diabetes mellitus with other skin ulcer: Secondary | ICD-10-CM | POA: Diagnosis not present

## 2023-11-14 DIAGNOSIS — I872 Venous insufficiency (chronic) (peripheral): Secondary | ICD-10-CM | POA: Diagnosis not present

## 2023-11-14 DIAGNOSIS — L97822 Non-pressure chronic ulcer of other part of left lower leg with fat layer exposed: Secondary | ICD-10-CM | POA: Diagnosis not present

## 2023-11-19 DIAGNOSIS — G4733 Obstructive sleep apnea (adult) (pediatric): Secondary | ICD-10-CM | POA: Diagnosis not present

## 2023-11-21 ENCOUNTER — Encounter (HOSPITAL_BASED_OUTPATIENT_CLINIC_OR_DEPARTMENT_OTHER): Admitting: General Surgery

## 2023-11-21 DIAGNOSIS — E11622 Type 2 diabetes mellitus with other skin ulcer: Secondary | ICD-10-CM | POA: Diagnosis not present

## 2023-12-04 ENCOUNTER — Encounter (HOSPITAL_BASED_OUTPATIENT_CLINIC_OR_DEPARTMENT_OTHER): Attending: General Surgery | Admitting: General Surgery

## 2023-12-04 DIAGNOSIS — I890 Lymphedema, not elsewhere classified: Secondary | ICD-10-CM | POA: Diagnosis not present

## 2023-12-04 DIAGNOSIS — E11622 Type 2 diabetes mellitus with other skin ulcer: Secondary | ICD-10-CM | POA: Diagnosis not present

## 2023-12-04 DIAGNOSIS — I872 Venous insufficiency (chronic) (peripheral): Secondary | ICD-10-CM | POA: Insufficient documentation

## 2023-12-04 DIAGNOSIS — I5030 Unspecified diastolic (congestive) heart failure: Secondary | ICD-10-CM | POA: Diagnosis not present

## 2023-12-04 DIAGNOSIS — L97822 Non-pressure chronic ulcer of other part of left lower leg with fat layer exposed: Secondary | ICD-10-CM | POA: Diagnosis present

## 2023-12-04 DIAGNOSIS — Z6841 Body Mass Index (BMI) 40.0 and over, adult: Secondary | ICD-10-CM | POA: Diagnosis not present

## 2023-12-08 ENCOUNTER — Encounter: Payer: Self-pay | Admitting: Internal Medicine

## 2023-12-08 ENCOUNTER — Ambulatory Visit: Attending: Internal Medicine | Admitting: Internal Medicine

## 2023-12-08 DIAGNOSIS — Z6838 Body mass index (BMI) 38.0-38.9, adult: Secondary | ICD-10-CM

## 2023-12-08 DIAGNOSIS — I152 Hypertension secondary to endocrine disorders: Secondary | ICD-10-CM

## 2023-12-08 DIAGNOSIS — Z7985 Long-term (current) use of injectable non-insulin antidiabetic drugs: Secondary | ICD-10-CM

## 2023-12-08 DIAGNOSIS — J4540 Moderate persistent asthma, uncomplicated: Secondary | ICD-10-CM | POA: Diagnosis not present

## 2023-12-08 DIAGNOSIS — I5032 Chronic diastolic (congestive) heart failure: Secondary | ICD-10-CM | POA: Diagnosis not present

## 2023-12-08 DIAGNOSIS — L97929 Non-pressure chronic ulcer of unspecified part of left lower leg with unspecified severity: Secondary | ICD-10-CM

## 2023-12-08 DIAGNOSIS — E1159 Type 2 diabetes mellitus with other circulatory complications: Secondary | ICD-10-CM

## 2023-12-08 DIAGNOSIS — Z86718 Personal history of other venous thrombosis and embolism: Secondary | ICD-10-CM | POA: Diagnosis not present

## 2023-12-08 DIAGNOSIS — F330 Major depressive disorder, recurrent, mild: Secondary | ICD-10-CM

## 2023-12-08 DIAGNOSIS — E1169 Type 2 diabetes mellitus with other specified complication: Secondary | ICD-10-CM

## 2023-12-08 DIAGNOSIS — I2510 Atherosclerotic heart disease of native coronary artery without angina pectoris: Secondary | ICD-10-CM | POA: Diagnosis not present

## 2023-12-08 DIAGNOSIS — G4700 Insomnia, unspecified: Secondary | ICD-10-CM

## 2023-12-08 LAB — POCT GLYCOSYLATED HEMOGLOBIN (HGB A1C): HbA1c, POC (controlled diabetic range): 5.6 % (ref 0.0–7.0)

## 2023-12-08 LAB — GLUCOSE, POCT (MANUAL RESULT ENTRY): POC Glucose: 103 mg/dL — AB (ref 70–99)

## 2023-12-08 MED ORDER — APIXABAN 5 MG PO TABS: 5.0000 mg | ORAL_TABLET | Freq: Two times a day (BID) | ORAL | 1 refills | Status: AC

## 2023-12-08 MED ORDER — PAROXETINE HCL 20 MG PO TABS: 30.0000 mg | ORAL_TABLET | Freq: Every day | ORAL | 6 refills | Status: DC

## 2023-12-08 NOTE — Patient Instructions (Signed)
 VISIT SUMMARY:  Today, we discussed the management of your chronic conditions and addressed your concerns about worsening depression, diabetes, obesity, congestive heart failure, lymphedema, asthma, and insomnia. We reviewed your current medications and made some adjustments to better manage your symptoms.  YOUR PLAN:  -DEPRESSION AND GENERALIZED ANXIETY DISORDER: Your depression has worsened due to family stressors. We will increase your Paxil  dosage to 30 mg daily and refer you to behavioral health for counseling. Depression is a mood disorder that causes persistent feelings of sadness and loss of interest.  -TYPE 2 DIABETES MELLITUS: Your diabetes is well-controlled with an A1c of 5.6 and a blood sugar level of 103. Continue taking Ozempic  2 mg as it helps with appetite reduction and weight loss. Type 2 diabetes is a condition that affects the way your body processes blood sugar.  -OBESITY: You have lost 22 pounds since last November, with 8 pounds lost since June. Continue taking Ozempic  as it aids in weight loss and appetite control. Obesity is a condition characterized by excessive body fat.  -CONGESTIVE HEART FAILURE AND HYPERTENSION: Your blood pressure is well-controlled at 120/80. Continue taking your current medications: torsemide , spironolactone , amlodipine , atorvastatin , and potassium. Congestive heart failure is a condition where the heart doesn't pump blood as well as it should, and hypertension is high blood pressure.  -CHRONIC LYMPHEDEMA AND CHRONIC WOUND, LEFT LOWER LEG: Your chronic wound is healing and is related to lymphedema. We will switch from an unna boot to a compression sock. Lymphedema is swelling that generally occurs in one of your arms or legs.  -ASTHMA: Your asthma is well-controlled with no recent flare-ups. Continue using your Dulera  inhaler and CPAP machine. Asthma is a condition in which your airways narrow and swell and may produce extra mucus.  -INSOMNIA: You  have chronic insomnia, which is difficulty falling or staying asleep. We discussed improving your sleep hygiene by going to bed only when sleepy, avoiding caffeine  within 4 hours of bedtime, and eliminating screen time and ambient light in bed.  INSTRUCTIONS:  Please follow up with behavioral health for counseling regarding your depression. Continue taking your current medications as prescribed. For your insomnia, try to implement the sleep hygiene tips we discussed. If you have any new symptoms or concerns, please schedule an appointment.

## 2023-12-08 NOTE — Progress Notes (Signed)
 Patient ID: [name], female    DOB: July 29, 1970  MRN: [id number]  CC: Diabetes (DM f/u. Med refill. Layvonne dosage increase for Paxil /L ear pain, ringing, wax build-up - vertigo sx)   Subjective: [name] is a 53 y.o. female who presents for chronic ds management. 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  EGD and colonoscopy negative, chronic ulcer left lower extremity,    Discussed the use of AI scribe software for clinical note transcription with the patient, who gave verbal consent to proceed.  History of Present Illness [name] is a 53 year old female who presents for follow-up of her chronic conditions.  MDD: She is experiencing worsening depression, particularly after learning about her uncle's cancer diagnosis and her mother's declining health. She is currently taking Paxil  20 mg daily and is considering an increase in dosage due to increased worry and stress related to her caregiving responsibilities for her mother. She sees a Veterinary surgeon every two months through her wound care provider but does not have a personal one-on-one counselor. No thoughts of self-harm or harm to others.  DM: Results for orders placed or performed in visit on 12/08/23  POCT glycosylated hemoglobin (Hb A1C)   Collection Time: 12/08/23  2:14 PM  Result Value Ref Range   Hemoglobin A1C     HbA1c POC (<> result, manual entry)     HbA1c, POC (prediabetic range)     HbA1c, POC (controlled diabetic range) 5.6 0.0 - 7.0 %  POCT glucose (manual entry)   Collection Time: 12/08/23  2:15 PM  Result Value Ref Range   POC Glucose 103 (A) 70 - 99 mg/dl  Her diabetes is managed with Ozempic  2 mg, which has  contributed to a weight loss of 22 pounds since November of last year. Her A1c is 5.6, and her blood sugar is 103. She does not regularly check her blood sugars. Ozempic  continues to decrease her appetite, and she is eating smaller portions.  HTN/HL/CAD/CHF: She is taking torsemide , spironolactone , amlodipine  2.5 mg, atorvastatin , and a potassium supplement. She limits salt intake and reports no shortness of breath or chest pain. She experiences some swelling in her left leg, which has an ulcer over the shin is being managed with an uno boot by wound care. Ulcer is healing.  Asthma/OSA: She uses a Dulera  inhaler for asthma and reports no recent flare-ups. She also uses a CPAP machine. She has longstanding issues with sleep, often staying awake until 5 AM despite going to bed between 8 and 10 PM. She has tried Benadryl , Advil, and Tylenol  without success. She drinks coffee at 5:30 PM and a zero Pepsi at dinner.  She is taking Eliquis  for hx of DVTand reports no bruising or bleeding. She needs refills for this medication.    Patient Active Problem List   Diagnosis Date Noted   Abdominal pain in female patient 06/24/2023   Peripheral edema 01/16/2023   Chronic diastolic CHF (congestive heart failure) (HCC) 01/16/2023   Lack of motivation 01/08/2023   Anasarca 01/07/2023   Orthostatic dizziness 11/27/2022   Vertigo 11/26/2022   Type 2 diabetes mellitus with morbid obesity (HCC) 09/18/2022   Moderate major depression (HCC) 05/07/2022   Hyperlipidemia associated with type 2  diabetes mellitus (HCC) 02/17/2022   Pain in thoracic spine 09/25/2021   Low back pain 09/25/2021   OSA (obstructive sleep apnea) 07/11/2021   Depression 05/28/2021   Morbid obesity with BMI of 50.0-59.9, adult (HCC) 05/26/2021   Bilateral lower extremity edema 11/22/2020   History of DVT (deep vein thrombosis) 11/22/2020   Asthma, chronic, moderate persistent, with acute exacerbation 11/22/2020   Lymphedema 06/09/2020    Iron deficiency anemia due to chronic blood loss 05/10/2020   Venous insufficiency 04/13/2020   S/P insertion of IVC (inferior vena caval) filter 04/13/2020   Chronic anemia 04/13/2020   Essential hypertension 04/13/2020   (HFpEF) heart failure with preserved ejection fraction (HCC) -> although echo suggests normal diastolic parameters with normal left atrial size 04/13/2020     Current Outpatient Medications on File Prior to Visit  Medication Sig Dispense Refill   acetaminophen  (TYLENOL ) 500 MG tablet Take 1 tablet (500 mg total) by mouth every 8 (eight) hours as needed. 60 tablet 0   amLODipine  (NORVASC ) 2.5 MG tablet Take 1 tablet (2.5 mg total) by mouth daily. 90 tablet 1   apixaban  (ELIQUIS ) 5 MG TABS tablet Take 1 tablet (5 mg total) by mouth 2 (two) times daily. 180 tablet 1   atorvastatin  (LIPITOR) 40 MG tablet Take 1 tablet (40 mg total) by mouth daily. 90 tablet 1   cholecalciferol  (VITAMIN D3) 25 MCG (1000 UNIT) tablet Take 1 tablet (1,000 Units total) by mouth daily. 30 tablet 5   docusate sodium  (COLACE) 100 MG capsule Take 1 capsule (100 mg total) by mouth 2 (two) times daily as needed for mild constipation.     FEROSUL 325 (65 Fe) MG tablet TAKE ONE TABLET BY MOUTH DAILY WITH BREAKFAST 100 tablet 0   gabapentin  (NEURONTIN ) 400 MG capsule Take 1 capsule (400 mg total) by mouth 3 (three) times daily. 270 capsule 1   methocarbamol  (ROBAXIN ) 500 MG tablet Take 1 tablet (500 mg total) by mouth 2 (two) times daily as needed for muscle spasms. 60 tablet 0   mometasone -formoterol  (DULERA ) 200-5 MCG/ACT AERO INHALE 2 PUFFS EVERY MORNING AND 2 PUFFS EVERY NIGHT AT BEDTIME 13 g 11   nitroGLYCERIN  (NITROSTAT ) 0.4 MG SL tablet DISSOLVE 1 TABLET UNDER THE TONGUE AS NEEDED FOR CHEST PAIN EVERY 5 MINUTES UP TO 3 TIMES. IF NO RELIEF CALL 911. (Patient taking differently: Place 0.4 mg under the tongue every 5 (five) minutes as needed for chest pain.) 25 tablet 2   PARoxetine  (PAXIL ) 20 MG tablet  Take 1 tablet (20 mg total) by mouth daily. 30 tablet 6   potassium chloride  SA (KLOR-CON  M) 20 MEQ tablet Take 2 tablets (40 mEq total) by mouth daily. 180 tablet 2   Semaglutide , 2 MG/DOSE, (OZEMPIC , 2 MG/DOSE,) 8 MG/3ML SOPN DIAL AND INJECT 2 MG UNDER THE SKIN ONCE WEEKLY 3 mL 4   spironolactone  (ALDACTONE ) 25 MG tablet Take 1 tablet (25 mg total) by mouth daily. 30 tablet 0   triamcinolone cream (KENALOG) 0.1 % Apply 1 Application topically daily as needed (skin irritation).     vitamin B-12 (CYANOCOBALAMIN ) 1000 MCG tablet Take 1,000 mcg by mouth daily.     torsemide  (DEMADEX ) 20 MG tablet Take 2 tablets (40 mg total) by mouth daily. (Patient not taking: Reported on 12/08/2023) 180 tablet 1   No current facility-administered medications on file prior to visit.    Allergies  Allergen Reactions   Ace Inhibitors Rash    Make pt bleed   Hydromorphone Hives  and Itching   Vancomycin Itching and Rash   Contrast Media [Iodinated Contrast Media] Hives   Dilaudid [Hydromorphone Hcl] Hives   Farxiga  [Dapagliflozin ] Other (See Comments)    Vaginal yeast   Lidocaine  Itching    Itching with Lidocaine  patch reported 06/13/2021 vis telephone message.    Social History   Socioeconomic History   Marital status: Single    Spouse name: Not on file   Number of children: 0   Years of education: Not on file   Highest education level: Some college, no degree  Occupational History   Occupation: unemployed on disablity  Tobacco Use   Smoking status: Former    Current packs/day: 0.00    Average packs/day: 0.3 packs/day for 18.0 years (4.5 ttl pk-yrs)    Types: Cigarettes    Start date: 04/29/1984    Quit date: 04/29/2002    Years since quitting: 21.6   Smokeless tobacco: Never  Vaping Use   Vaping status: Never Used  Substance and Sexual Activity   Alcohol use: Not Currently   Drug use: Not Currently   Sexual activity: Not Currently    Birth control/protection: Condom  Other Topics Concern    Not on file  Social History Narrative   Not on file   Social Drivers of Health   Financial Resource Strain: Low Risk  (10/20/2023)   Overall Financial Resource Strain (CARDIA)    Difficulty of Paying Living Expenses: Not hard at all  Food Insecurity: No Food Insecurity (10/20/2023)   Hunger Vital Sign    Worried About Running Out of Food in the Last Year: Never true    Ran Out of Food in the Last Year: Never true  Transportation Needs: No Transportation Needs (10/20/2023)   PRAPARE - Administrator, Civil Service (Medical): No    Lack of Transportation (Non-Medical): No  Physical Activity: Inactive (10/20/2023)   Exercise Vital Sign    Days of Exercise per Week: 0 days    Minutes of Exercise per Session: Not on file  Stress: No Stress Concern Present (10/20/2023)   [employer] of Occupational Health - Occupational Stress Questionnaire    Feeling of Stress: Only a little  Social Connections: Moderately Isolated (10/20/2023)   Social Connection and Isolation Panel    Frequency of Communication with Friends and Family: More than three times a week    Frequency of Social Gatherings with Friends and Family: Twice a week    Attends Religious Services: 1 to 4 times per year    Active Member of [employer] or Organizations: No    Attends Engineer, structural: Not on file    Marital Status: Never married  Intimate Partner Violence: Not At Risk (03/06/2023)   Humiliation, Afraid, Rape, and Kick questionnaire    Fear of Current or Ex-Partner: No    Emotionally Abused: No    Physically Abused: No    Sexually Abused: No    Family History  Problem Relation Age of Onset   Diabetes Mellitus II Mother    Diabetes Mother    COPD Father    Diabetes Father    Colon cancer Father    Diabetes Mellitus II Maternal Grandmother    Breast cancer Paternal Grandfather    Liver disease Neg Hx    Esophageal cancer Neg Hx     Past Surgical History:  Procedure Laterality Date    ABDOMINAL WALL DEFECT REPAIR  1970   EYE SURGERY     When i  was born   IR CV LINE INJECTION  10/24/2020   IR REMOVAL TUN ACCESS W/ PORT W/O FL MOD SED  05/28/2021   IVC FILTER INSERTION  2017   Lower Extremity Venous Duplex  06/23/2020   No evidence of DVT or superficial thrombosis bilaterally.  No evidence of deep venous insufficiency bilaterally.  No evidence of SSV reflux.  Right GSV in the calf has reflux, no reflux in L GSV.;  Repeated in July 2020-no DVT   OOPHORECTOMY  1996   OOPHORECTOMY  1997   PORTACATH PLACEMENT  2014   TRANSTHORACIC ECHOCARDIOGRAM  05/18/2021   EF 60 to 65%.  No RWMA.  Mild concentric LVH.  Normal diastolic parameters .  Normal longitudinal strain.  Normal PAP, RAP.  Normal aortic and mitral valves.==> In July 2022, echo read as GR 1 DD otherwise stable.    ROS: Review of Systems Negative except as stated above  PHYSICAL EXAM: BP 120/80 (BP Location: Left Arm, Patient Position: Sitting, Cuff Size: Large)   Pulse 90   Temp 98.4 F (36.9 C) (Oral)   Ht 5' 9 (1.753 m)   Wt 264 lb (119.7 kg)   SpO2 100%   BMI 38.99 kg/m   Wt Readings from Last 3 Encounters:  12/08/23 264 lb (119.7 kg)  10/24/23 272 lb (123.4 kg)  10/20/23 253 lb (114.8 kg)    Physical Exam  General appearance - alert, well appearing, and in no distress Mental status - normal mood, behavior, speech, dress, motor activity, and thought processes Chest - clear to auscultation, no wheezes, rales or rhonchi, symmetric air entry Heart - normal rate, regular rhythm, normal S1, S2, no murmurs, rubs, clicks or gallops Extremities -left lower leg is wrapped.  I did not remove dressing.  Right leg she is wearing compression socks.     12/08/2023    2:12 PM 10/24/2023    1:55 PM 08/07/2023    3:03 PM  Depression screen PHQ 2/9  Decreased Interest 0 0 0  Down, Depressed, Hopeless 0 0 0  PHQ - 2 Score 0 0 0  Altered sleeping 3 1 2   Tired, decreased energy 2 1 2   Change in appetite 0 0 0   Feeling bad or failure about yourself  0 0 0  Trouble concentrating 0 0 0  Moving slowly or fidgety/restless 0 0 0  Suicidal thoughts 0 0 0  PHQ-9 Score 5 2 4   Difficult doing work/chores Somewhat difficult Somewhat difficult Somewhat difficult       Latest Ref Rng & Units 10/16/2023    1:38 PM 08/21/2023    2:25 PM 07/10/2023   12:09 PM  CMP  Glucose 70 - 99 mg/dL 86  83  95   BUN 6 - 20 mg/dL 12  9  8    Creatinine 0.44 - 1.00 mg/dL 9.26  9.38  9.27   Sodium 135 - 145 mmol/L 140  138  138   Potassium 3.5 - 5.1 mmol/L 3.7  4.0  4.0   Chloride 98 - 111 mmol/L 104  102  104   CO2 22 - 32 mmol/L 29  27  27    Calcium  8.9 - 10.3 mg/dL 9.6  9.5  9.2   Total Protein 6.5 - 8.1 g/dL 8.0   6.9   Total Bilirubin 0.0 - 1.2 mg/dL 0.8   1.1   Alkaline Phos 38 - 126 U/L 82   58   AST 15 - 41 U/L 10   15  ALT 0 - 44 U/L 5   10    Lipid Panel     Component Value Date/Time   CHOL 129 09/05/2022 1438   TRIG 70 09/05/2022 1438   HDL 49 09/05/2022 1438   CHOLHDL 2.6 09/05/2022 1438   LDLCALC 66 09/05/2022 1438    CBC    Component Value Date/Time   WBC 6.5 10/16/2023 1338   WBC 4.6 08/21/2023 1425   RBC 4.04 10/16/2023 1338   HGB 11.0 (L) 10/16/2023 1338   HGB 12.3 01/31/2022 1341   HCT 36.4 10/16/2023 1338   HCT 38.8 01/31/2022 1341   PLT 161 10/16/2023 1338   PLT 109 (L) 01/31/2022 1341   MCV 90.1 10/16/2023 1338   MCV 87 01/31/2022 1341   MCH 27.2 10/16/2023 1338   MCHC 30.2 10/16/2023 1338   RDW 14.7 10/16/2023 1338   RDW 12.3 01/31/2022 1341   LYMPHSABS 0.7 10/16/2023 1338   LYMPHSABS 0.5 (L) 11/29/2020 1445   MONOABS 0.5 10/16/2023 1338   EOSABS 0.0 10/16/2023 1338   EOSABS 0.0 11/29/2020 1445   BASOSABS 0.0 10/16/2023 1338   BASOSABS 0.0 11/29/2020 1445    ASSESSMENT AND PLAN: 1. Type 2 diabetes mellitus with morbid obesity (HCC) (Primary) A1c is at goal.  Commended her on weight loss so far.  Will continue Ozempic  at 2 mg once a week.  Continue eating smaller  portions. - POCT glycosylated hemoglobin (Hb A1C) - POCT glucose (manual entry)  2. Long-term (current) use of injectable non-insulin  antidiabetic drugs See #1 above.  3. Major depressive disorder, recurrent episode, mild (HCC) We discussed increasing the Paxil  to 30 mg daily.  She is agreeable to referral to a counselor. - Ambulatory referral to Psychiatry - PARoxetine  (PAXIL ) 20 MG tablet; Take 1.5 tablets (30 mg total) by mouth daily.  Dispense: 45 tablet; Refill: 6  4. Asthma, moderate persistent, well-controlled Stable on Dulera   5. Diastolic CHF, chronic (HCC) Stable.  Continue torsemide  and spironolactone   6. Coronary artery disease involving native coronary artery of native heart without angina pectoris Stable.  Continue atorvastatin   7. Insomnia, unspecified type Good sleep hygiene discussed and encouraged. Patient advised not to drink any caffeinated beverages or excessive alcohol use within several hours of bedtime.  Advised to get in bed around about the same time every night.  Once in bed, turn off all lights and sounds.  If unable to fall asleep within 30 to 45 minutes of getting in bed, patient should get up and try to do something until she feels sleepy again.  At that time try getting back in bed. - I recommend that she try getting in bed much later than 8 PM since she is not sleepy at that time.  Encouraged her to turn off the TV and to put her phone down once she gets in bed.  8. Hypertension associated with diabetes (HCC) At goal.  Continue spironolactone , Norvasc  2.5 mg daily and torsemide   9. Ulcer of left lower extremity, unspecified ulcer stage (HCC) Plugged in with wound care.  10. History of DVT (deep vein thrombosis) - apixaban  (ELIQUIS ) 5 MG TABS tablet; Take 1 tablet (5 mg total) by mouth 2 (two) times daily.  Dispense: 180 tablet; Refill: 1  Patient was given the opportunity to ask questions.  Patient verbalized understanding of the plan and was able  to repeat key elements of the plan.   This documentation was completed using Paediatric nurse.  Any transcriptional errors are unintentional.  Orders  Placed This Encounter  Procedures   POCT glycosylated hemoglobin (Hb A1C)   POCT glucose (manual entry)     Requested Prescriptions   Pending Prescriptions Disp Refills   methocarbamol  (ROBAXIN ) 500 MG tablet 60 tablet 0    Sig: Take 1 tablet (500 mg total) by mouth 2 (two) times daily as needed for muscle spasms.    No follow-ups on file.  Barnie Louder, MD, FACP

## 2023-12-09 ENCOUNTER — Other Ambulatory Visit: Payer: Self-pay | Admitting: Internal Medicine

## 2023-12-09 DIAGNOSIS — Z86718 Personal history of other venous thrombosis and embolism: Secondary | ICD-10-CM

## 2023-12-09 NOTE — Telephone Encounter (Signed)
 Copied from CRM [id number]. Topic: Clinical - Medication Refill >> Dec 09, 2023  1:11 PM Mia F wrote: Medication: apixaban  (ELIQUIS ) 5 MG TABS tablet   Has the patient contacted their pharmacy? Yes (Agent: If no, request that the patient contact the pharmacy for the refill. If patient does not wish to contact the pharmacy document the reason why and proceed with request.) (Agent: If yes, when and what did the pharmacy advise?)  This is the patient's preferred pharmacy:  [facility] [id number] - [place], [place] - [address] [place] [address] Phone: [phone] Fax: [phone]  Is this the correct pharmacy for this prescription? Yes If no, delete pharmacy and type the correct one.   Has the prescription been filled recently? No  Is the patient out of the medication? Yes  Has the patient been seen for an appointment in the last year OR does the patient have an upcoming appointment? Yes  Can we respond through MyChart? Yes  Agent: Please be advised that Rx refills may take up to 3 business days. We ask that you follow-up with your pharmacy.

## 2023-12-11 ENCOUNTER — Encounter (HOSPITAL_BASED_OUTPATIENT_CLINIC_OR_DEPARTMENT_OTHER): Admitting: General Surgery

## 2023-12-11 DIAGNOSIS — I872 Venous insufficiency (chronic) (peripheral): Secondary | ICD-10-CM | POA: Diagnosis not present

## 2023-12-11 DIAGNOSIS — S51801A Unspecified open wound of right forearm, initial encounter: Secondary | ICD-10-CM | POA: Diagnosis not present

## 2023-12-11 DIAGNOSIS — E11622 Type 2 diabetes mellitus with other skin ulcer: Secondary | ICD-10-CM | POA: Diagnosis not present

## 2023-12-11 DIAGNOSIS — L97822 Non-pressure chronic ulcer of other part of left lower leg with fat layer exposed: Secondary | ICD-10-CM | POA: Diagnosis not present

## 2023-12-12 NOTE — Telephone Encounter (Signed)
 Duplicate request, LRF 12/08/23 for 90 and 1 RF.E-Prescribing Status: Receipt confirmed by pharmacy (12/08/2023  3:25 PM EDT)   Requested Prescriptions  Pending Prescriptions Disp Refills   apixaban  (ELIQUIS ) 5 MG TABS tablet 180 tablet 1    Sig: Take 1 tablet (5 mg total) by mouth 2 (two) times daily.     Hematology:  Anticoagulants - apixaban  Failed - 12/12/2023  9:24 AM      Failed - HGB in normal range and within 360 days    Hemoglobin  Date Value Ref Range Status  10/16/2023 11.0 (L) 12.0 - 15.0 g/dL Final  89/94/7976 87.6 11.1 - 15.9 g/dL Final         Failed - AST in normal range and within 360 days    AST  Date Value Ref Range Status  10/16/2023 10 (L) 15 - 41 U/L Final         Passed - PLT in normal range and within 360 days    Platelets  Date Value Ref Range Status  01/31/2022 109 (L) 150 - 450 x10E3/uL Final    Comment:    Actual platelet count may be somewhat higher than reported due to aggregation of platelets in this sample.    Platelet Count  Date Value Ref Range Status  10/16/2023 161 150 - 400 K/uL Final         Passed - HCT in normal range and within 360 days    HCT  Date Value Ref Range Status  10/16/2023 36.4 36.0 - 46.0 % Final   Hematocrit  Date Value Ref Range Status  01/31/2022 38.8 34.0 - 46.6 % Final         Passed - Cr in normal range and within 360 days    Creatinine  Date Value Ref Range Status  10/16/2023 0.73 0.44 - 1.00 mg/dL Final         Passed - ALT in normal range and within 360 days    ALT  Date Value Ref Range Status  10/16/2023 5 0 - 44 U/L Final         Passed - Valid encounter within last 12 months    Recent Outpatient Visits           4 days ago Type 2 diabetes mellitus with morbid obesity (HCC)   [facility] Dept Of [place]. [facility] [name], MD   1 month ago Encounter for medication refill   [place] [facility] - [facility] Dept Of [place]. [facility] Barnie B, MD   4 months ago Type 2 diabetes mellitus with morbid obesity [facility])   [place] [facility] Dept Of [place]. [facility] [name], MD   4 months ago Rib contusion, left, sequela   [place] [facility] - A Dept Of [place]. [facility], Jon M, [place]   7 months ago Yeast infection    [facility] - A Dept Of [place]. [facility] [place], [place], PA-C

## 2023-12-17 ENCOUNTER — Encounter (HOSPITAL_BASED_OUTPATIENT_CLINIC_OR_DEPARTMENT_OTHER): Admitting: General Surgery

## 2023-12-17 ENCOUNTER — Other Ambulatory Visit: Payer: Self-pay | Admitting: Internal Medicine

## 2023-12-17 DIAGNOSIS — L97822 Non-pressure chronic ulcer of other part of left lower leg with fat layer exposed: Secondary | ICD-10-CM | POA: Diagnosis not present

## 2023-12-17 DIAGNOSIS — E11622 Type 2 diabetes mellitus with other skin ulcer: Secondary | ICD-10-CM | POA: Diagnosis not present

## 2023-12-17 DIAGNOSIS — I872 Venous insufficiency (chronic) (peripheral): Secondary | ICD-10-CM | POA: Diagnosis not present

## 2023-12-17 DIAGNOSIS — S20212S Contusion of left front wall of thorax, sequela: Secondary | ICD-10-CM

## 2023-12-17 DIAGNOSIS — I890 Lymphedema, not elsewhere classified: Secondary | ICD-10-CM | POA: Diagnosis not present

## 2023-12-24 ENCOUNTER — Encounter (HOSPITAL_BASED_OUTPATIENT_CLINIC_OR_DEPARTMENT_OTHER): Admitting: General Surgery

## 2023-12-24 DIAGNOSIS — L97822 Non-pressure chronic ulcer of other part of left lower leg with fat layer exposed: Secondary | ICD-10-CM | POA: Diagnosis not present

## 2023-12-24 DIAGNOSIS — I872 Venous insufficiency (chronic) (peripheral): Secondary | ICD-10-CM | POA: Diagnosis not present

## 2023-12-24 DIAGNOSIS — I890 Lymphedema, not elsewhere classified: Secondary | ICD-10-CM | POA: Diagnosis not present

## 2023-12-24 DIAGNOSIS — E11622 Type 2 diabetes mellitus with other skin ulcer: Secondary | ICD-10-CM | POA: Diagnosis not present

## 2023-12-25 ENCOUNTER — Other Ambulatory Visit: Payer: Self-pay | Admitting: Internal Medicine

## 2023-12-25 DIAGNOSIS — Z1231 Encounter for screening mammogram for malignant neoplasm of breast: Secondary | ICD-10-CM

## 2023-12-31 ENCOUNTER — Encounter (HOSPITAL_BASED_OUTPATIENT_CLINIC_OR_DEPARTMENT_OTHER): Attending: General Surgery | Admitting: General Surgery

## 2023-12-31 DIAGNOSIS — I872 Venous insufficiency (chronic) (peripheral): Secondary | ICD-10-CM | POA: Diagnosis not present

## 2023-12-31 DIAGNOSIS — E11622 Type 2 diabetes mellitus with other skin ulcer: Secondary | ICD-10-CM | POA: Diagnosis present

## 2023-12-31 DIAGNOSIS — L97822 Non-pressure chronic ulcer of other part of left lower leg with fat layer exposed: Secondary | ICD-10-CM | POA: Diagnosis not present

## 2023-12-31 DIAGNOSIS — I890 Lymphedema, not elsewhere classified: Secondary | ICD-10-CM | POA: Diagnosis not present

## 2023-12-31 DIAGNOSIS — I5030 Unspecified diastolic (congestive) heart failure: Secondary | ICD-10-CM | POA: Insufficient documentation

## 2024-01-01 ENCOUNTER — Encounter: Payer: Self-pay | Admitting: Hematology

## 2024-01-01 ENCOUNTER — Ambulatory Visit
Admission: RE | Admit: 2024-01-01 | Discharge: 2024-01-01 | Disposition: A | Discharge status: Home / Self Care | Source: Ambulatory Visit | Attending: Internal Medicine | Admitting: Internal Medicine

## 2024-01-01 DIAGNOSIS — Z1231 Encounter for screening mammogram for malignant neoplasm of breast: Secondary | ICD-10-CM

## 2024-01-04 ENCOUNTER — Other Ambulatory Visit: Payer: Self-pay | Admitting: Internal Medicine

## 2024-01-04 DIAGNOSIS — S20212S Contusion of left front wall of thorax, sequela: Secondary | ICD-10-CM

## 2024-01-07 ENCOUNTER — Ambulatory Visit: Payer: Self-pay | Admitting: Internal Medicine

## 2024-01-08 ENCOUNTER — Encounter (HOSPITAL_BASED_OUTPATIENT_CLINIC_OR_DEPARTMENT_OTHER): Admitting: General Surgery

## 2024-01-08 DIAGNOSIS — E11622 Type 2 diabetes mellitus with other skin ulcer: Secondary | ICD-10-CM | POA: Diagnosis not present

## 2024-01-08 DIAGNOSIS — L97822 Non-pressure chronic ulcer of other part of left lower leg with fat layer exposed: Secondary | ICD-10-CM | POA: Diagnosis not present

## 2024-01-08 DIAGNOSIS — I872 Venous insufficiency (chronic) (peripheral): Secondary | ICD-10-CM | POA: Diagnosis not present

## 2024-01-16 ENCOUNTER — Encounter (HOSPITAL_BASED_OUTPATIENT_CLINIC_OR_DEPARTMENT_OTHER): Admitting: General Surgery

## 2024-01-16 DIAGNOSIS — L97822 Non-pressure chronic ulcer of other part of left lower leg with fat layer exposed: Secondary | ICD-10-CM | POA: Diagnosis not present

## 2024-01-16 DIAGNOSIS — I872 Venous insufficiency (chronic) (peripheral): Secondary | ICD-10-CM | POA: Diagnosis not present

## 2024-01-16 DIAGNOSIS — E11622 Type 2 diabetes mellitus with other skin ulcer: Secondary | ICD-10-CM | POA: Diagnosis not present

## 2024-01-22 ENCOUNTER — Encounter (HOSPITAL_BASED_OUTPATIENT_CLINIC_OR_DEPARTMENT_OTHER): Admitting: General Surgery

## 2024-01-22 DIAGNOSIS — L97822 Non-pressure chronic ulcer of other part of left lower leg with fat layer exposed: Secondary | ICD-10-CM | POA: Diagnosis not present

## 2024-01-22 DIAGNOSIS — I872 Venous insufficiency (chronic) (peripheral): Secondary | ICD-10-CM | POA: Diagnosis not present

## 2024-01-22 DIAGNOSIS — E11622 Type 2 diabetes mellitus with other skin ulcer: Secondary | ICD-10-CM | POA: Diagnosis not present

## 2024-01-23 ENCOUNTER — Telehealth: Payer: Self-pay

## 2024-01-23 DIAGNOSIS — Z7189 Other specified counseling: Secondary | ICD-10-CM

## 2024-01-23 NOTE — Progress Notes (Signed)
 Complex Care Management Note  Care Guide Note 01/23/2024 Name: [name] MRN: [id number] DOB: [birth year]  [name] is a 53 y.o. year old female who sees Louder Barnie NOVAK, MD for primary care. I reached out to [name] by phone today to offer complex care management services.  [name] was given information about Complex Care Management services today including:   The Complex Care Management services include support from the care team which includes your Nurse Care Manager, Clinical Social Worker, or Pharmacist.  The Complex Care Management team is here to help remove barriers to the health concerns and goals most important to you. Complex Care Management services are voluntary, and the patient may decline or stop services at any time by request to their care team member.   Complex Care Management Consent Status: Patient agreed to services and verbal consent obtained.   Follow up plan:  Telephone appointment with complex care management team member scheduled for:  02/04/2024  Encounter Outcome:  Patient Scheduled  Jeoffrey Buffalo , RMA     Kathleen  [facility], [facility] Guide  Direct Dial: [phone]  Website: [email or web address]

## 2024-01-28 ENCOUNTER — Encounter (HOSPITAL_BASED_OUTPATIENT_CLINIC_OR_DEPARTMENT_OTHER): Attending: General Surgery | Admitting: Internal Medicine

## 2024-01-28 DIAGNOSIS — L97822 Non-pressure chronic ulcer of other part of left lower leg with fat layer exposed: Secondary | ICD-10-CM | POA: Diagnosis not present

## 2024-01-28 DIAGNOSIS — I872 Venous insufficiency (chronic) (peripheral): Secondary | ICD-10-CM | POA: Diagnosis not present

## 2024-01-28 DIAGNOSIS — I5030 Unspecified diastolic (congestive) heart failure: Secondary | ICD-10-CM | POA: Diagnosis not present

## 2024-01-28 DIAGNOSIS — E11622 Type 2 diabetes mellitus with other skin ulcer: Secondary | ICD-10-CM | POA: Diagnosis present

## 2024-01-28 DIAGNOSIS — I890 Lymphedema, not elsewhere classified: Secondary | ICD-10-CM | POA: Diagnosis not present

## 2024-02-04 ENCOUNTER — Other Ambulatory Visit: Payer: Self-pay | Admitting: *Deleted

## 2024-02-04 ENCOUNTER — Other Ambulatory Visit: Payer: Self-pay

## 2024-02-04 NOTE — Patient Outreach (Signed)
 Complex Care Management   Visit Note  02/04/2024  Name:  [name] MRN: [id number] DOB: 03-Feb-1971  Situation: Referral received for Complex Care Management related to Heart Failure and HTN I obtained verbal consent from Patient.  Visit completed with Patient  on the phone  Background:   Past Medical History:  Diagnosis Date   (HFpEF) heart failure with preserved ejection fraction (HCC) -> although echo suggests normal diastolic parameters with normal left atrial size 04/13/2020   Allergy     Arthritis    Cellulitis    Clotting disorder 2024   COPD (chronic obstructive pulmonary disease) (HCC)    Coronary artery disease, non-occlusive    OBSTRUCTIVE: PT STATES - HAD A CARDIAC CATH - NOT TOLD SHE HAD CAD -> week note from [place]  indicates history of MI (patient cannot corroborate   Diabetes mellitus without complication (HCC)    DVT (deep venous thrombosis) (HCC) 09/17/2017   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   Morbid obesity due to excess calories (HCC)    OSA (obstructive sleep apnea) 07/11/2021   Sleep study April 06, 2019 (Dr. Shellia): Moderate OSA (AHI of 24.4 and SPO2 low of 77%).  Majority events during REM sleep.-recommend CPAP, oral appliance or surgical. Reviewed sleep hygiene.  Avoid sedatives.     Osteoarthritis of left knee    Prediabetes    Sleep apnea    Small bowel obstruction (HCC)    as a child   Speech impediment    Stutter / stammer   Stroke [facility]) 2007    Assessment: Patient Reported Symptoms:  Cognitive Cognitive Status: Able to follow simple commands, Alert and oriented to person,  place, and time, Insightful and able to interpret abstract concepts, Normal speech and language skills Cognitive/Intellectual Conditions Management [RPT]: None reported or documented in medical history or problem list   Health Maintenance Behaviors: Annual physical exam, Healthy diet, Sleep adequate, Stress management, Spiritual practice(s), Social activities Healing Pattern: Slow Health Facilitated by: Healthy diet, Prayer/meditation, Rest, Stress management  Neurological Neurological Review of Symptoms: No symptoms reported Neurological Management Strategies: Adequate rest Neurological Self-Management Outcome: 4 (good)  HEENT HEENT Symptoms Reported: Other: HEENT Comment: Reports that she occassionally get sores in her eyes.  Uses vaseline around her ears.  Informed patient to let provider know if she has drainage or increase pain from ear.    Cardiovascular Cardiovascular Symptoms Reported: Swelling in legs or feet Does patient have uncontrolled Hypertension?: Yes Is patient checking Blood Pressure at home?: Yes Patient's Recent BP reading at home: Patient reports that Blood Pressure was 142/96 Cardiovascular Management Strategies: Adequate rest, Medication therapy, Coping strategies, Weight management, Routine screening, Diet modification, Activity, Fluid modification Do You Have a Working Readable Scale?: Yes Weight: 247 lb (112 kg) Cardiovascular Comment: Patient reports that she has chronic edema in both her legs.  She reports that she wears compression stockings that help.  Respiratory Other Respiratory Symptoms: Patient reports occassional shortness of breath and wheezing.  Patients that she uses inhalers and they help with shortness of breath and wheezing.  Patient also reports that she uses CPAP at night. Respiratory Management  Strategies: CPAP, Weight management Respiratory Self-Management Outcome: 4 (good)  Endocrine Endocrine Symptoms Reported: No symptoms reported Is patient  diabetic?: Yes Is patient checking blood sugars at home?: Yes List most recent blood sugar readings, include date and time of day: Blood Sugar- 100 after eating Endocrine Self-Management Outcome: 4 (good)  Gastrointestinal Gastrointestinal Symptoms Reported: Constipation Additional Gastrointestinal Details: Occasional constipation.  Reports that she takes stool softner to assist with constipation. Gastrointestinal Management Strategies: Adequate rest, Activity Gastrointestinal Self-Management Outcome: 4 (good)    Genitourinary Genitourinary Symptoms Reported: No symptoms reported Genitourinary Management Strategies: Adequate rest Genitourinary Self-Management Outcome: 4 (good)  Integumentary Integumentary Symptoms Reported: Wound Additional Integumentary Details: Patient reports that she had a wound on her left leg.  She informs me that she was seeing see the [facility] and she no longer goes.  She reported that her wound closed up the first of this month. Skin Management Strategies: Adequate rest, Coping strategies, Routine screening Skin Self-Management Outcome: 4 (good)  Musculoskeletal Musculoskelatal Symptoms Reviewed: No symptoms reported Additional Musculoskeletal Details: Patient she is able to ambulate with a cane and a walker. Musculoskeletal Management Strategies: Adequate rest, Activity, Coping strategies, Routine screening, Medical device Musculoskeletal Self-Management Outcome: 4 (good) Falls in the past year?: No Number of falls in past year: 1 or less Patient at Risk for Falls Due to: No Fall Risks Fall risk Follow up: Falls evaluation completed  Psychosocial Psychosocial Symptoms Reported: Anxiety - if selected complete GAD Additional Psychological Details: Patient reports that she takes Paxil  to help with anxiety.  She reports that she has an appt to see a couselor on 02/12/24 for anxiety.  She informs me that she is taking care of her mother and she gets anxious due  to the stress of care for her. Behavioral Management Strategies: Counseling, Coping strategies, Medication therapy Behavioral Health Self-Management Outcome: 3 (uncertain) Major Change/Loss/Stressor/Fears (CP): Medical condition, family, Medical condition, self Techniques to Cope with Loss/Stress/Change: Counseling, Diversional activities Quality of Family Relationships: involved, helpful, supportive Do you feel physically threatened by others?: No    02/04/2024    PHQ2-9 Depression Screening   Little interest or pleasure in doing things Not at all  Feeling down, depressed, or hopeless Not at all  PHQ-2 - Total Score 0  Trouble falling or staying asleep, or sleeping too much    Feeling tired or having little energy    Poor appetite or overeating     Feeling bad about yourself - or that you are a failure or have let yourself or your family down    Trouble concentrating on things, such as reading the newspaper or watching television    Moving or speaking so slowly that other people could have noticed.  Or the opposite - being so fidgety or restless that you have been moving around a lot more than usual    Thoughts that you would be better off dead, or hurting yourself in some way    PHQ2-9 Total Score    If you checked off any problems, how difficult have these problems made it for you to do your work, take care of things at home, or get along with other people    Depression Interventions/Treatment      Vitals:   02/04/24 1531  BP: (!) 142/96  Pulse: 98  SpO2: 100%    Medications Reviewed Today     Reviewed by Jorja Nichole LABOR, RN (Case Manager) on 02/04/24 at 1613  Med List Status: <None>  Medication Order Taking? Sig Documenting Provider Last Dose Status Informant  acetaminophen  (TYLENOL ) 500 MG tablet [id number] Yes Take 1 tablet (500 mg total) by mouth every 8 (eight) hours as needed. [name], MD  Active   amLODipine  (NORVASC ) 2.5 MG tablet [id number] Yes Take 1  tablet (2.5 mg total) by mouth daily. [name], MD  Active   apixaban  (ELIQUIS ) 5 MG TABS tablet [id number] Yes Take 1 tablet (5 mg total) by mouth 2 (two) times daily. [name], MD  Active   atorvastatin  (LIPITOR) 40 MG tablet [id number] Yes Take 1 tablet (40 mg total) by mouth daily. [name], MD  Active   cholecalciferol  (VITAMIN D3) 25 MCG (1000 UNIT) tablet [id number] Yes Take 1 tablet (1,000 Units total) by mouth daily. [name], MD  Active   docusate sodium  (COLACE) 100 MG capsule [id number] Yes Take 1 capsule (100 mg total) by mouth 2 (two) times daily as needed for mild constipation. [name], MD  Active   FEROSUL 325 (65 Fe) MG tablet [id number] Yes TAKE ONE TABLET BY MOUTH DAILY WITH BREAKFAST Zimny, Deborah B, MD  Active Self, Multiple Informants, Pharmacy Records  gabapentin  (NEURONTIN ) 400 MG capsule [id number] Yes Take 1 capsule (400 mg total) by mouth 3 (three) times daily. [name], MD  Active   methocarbamol  (ROBAXIN ) 500 MG tablet [id number] Yes TAKE 1 TABLET BY MOUTH 2 TIMES A DAY AS NEEDED FOR MUSCLE SPASMS [name], MD  Active   mometasone -formoterol  (DULERA ) 200-5 MCG/ACT AERO [id number] Yes INHALE 2 PUFFS EVERY MORNING AND 2 PUFFS EVERY NIGHT AT BEDTIME [name], MD  Active   nitroGLYCERIN  (NITROSTAT ) 0.4 MG SL tablet [id number] Yes DISSOLVE 1 TABLET UNDER THE TONGUE AS NEEDED FOR CHEST PAIN EVERY 5 MINUTES UP TO 3 TIMES. IF NO RELIEF CALL 911.  Patient taking differently: DISSOLVE 1 TABLET UNDER THE TONGUE AS NEEDED FOR CHEST PAIN EVERY 5 MINUTES UP TO 3 TIMES. IF NO RELIEF CALL 911.   [name], MD  Active Multiple Informants, Pharmacy Records, Self           Med Note STEFFI, [place]   Thu Jul 10, 2023  1:08 PM) Has not needed. Has at home   PARoxetine  (PAXIL ) 20 MG tablet [id number] Yes Take 1.5 tablets (30 mg total) by mouth daily. [name], MD  Active   potassium chloride  SA  (KLOR-CON  M) 20 MEQ tablet [id number] Yes Take 2 tablets (40 mEq total) by mouth daily. [name], MD  Active Multiple Informants, Pharmacy Records, Self           Med Note STEFFI NIAN   Thu Jul 10, 2023  1:09 PM) The patient states she takes two tablets a day and has more of this rx left. Dispense history does not support this claim  Semaglutide , 2 MG/DOSE, (OZEMPIC , 2 MG/DOSE,) 8 MG/3ML SOPN [id number] Yes DIAL AND INJECT 2 MG UNDER THE SKIN ONCE WEEKLY [name], MD  Active   spironolactone  (ALDACTONE ) 25 MG tablet [id number] Yes Take 1 tablet (25 mg total) by mouth daily. [name], MD  Active   torsemide  (DEMADEX ) 20 MG tablet [id number] Yes Take 2 tablets (40 mg total) by mouth daily. [name], MD  Active Multiple Informants, Pharmacy Records, Self           Med Note STEFFI, NIAN   Thu Jul 10, 2023  1:10 PM) The patient states she takes  two tablets a day and has more of this rx left. Dispense history does not support this claim  triamcinolone cream (KENALOG) 0.1 % [id number] Yes Apply 1 Application topically daily as needed (skin irritation). [provider]  Active Multiple Informants, Pharmacy Records, Self  vitamin B-12 (CYANOCOBALAMIN ) 1000 MCG tablet [id number] Yes Take 1,000 mcg by mouth daily. [provider]  Active Self, Multiple Informants, Pharmacy Records            Recommendation:   PCP Follow-up Specialty provider follow-up : [facility]-02/12/24; Oncology-05/03/24; PCP-04/08/24 Continue Current Plan of Care  Follow Up Plan:   Telephone follow-up 2 weeks: 02/25/24 @ 3 pm  Yamel Bale, RN, BSN, [employer] RN Care Manager [employer] [phone]

## 2024-02-04 NOTE — Patient Instructions (Signed)
 Visit Information  Ms. Mehler was given information about Medicaid Managed Care team care coordination services as a part of their [facility] Community Plan Medicaid benefit.   If you would like to schedule transportation through your [facility], please call the following number at least 2 days in advance of your appointment: [phone]   Rides for urgent appointments can also be made after hours by calling Member Services.  Call the Behavioral Health Crisis Line at [phone], at any time, 24 hours a day, 7 days a week. If you are in danger or need immediate medical attention call 911.  Please see education materials related to HTN and CHF provided by MyChart link.  Patient verbalizes understanding of instructions and care plan provided today and agrees to view in MyChart. Active MyChart status and patient understanding of how to access instructions and care plan via MyChart confirmed with patient.     Telephone follow up appointment with Managed Medicaid care management team member scheduled for: 02/25/24 @ 3 pm  Ether Goebel, RN, BSN, [facility] RN Care Manager [facility] Population Health [phone]    Visit Information  Ms. Pavlicek was given information about Medicaid Managed Care team care coordination services as a part of their [facility] Community Plan Medicaid benefit.   If you would like to schedule transportation through your [facility], please call the following number at least 2 days in advance of your appointment: [phone]   Rides for urgent appointments can also be made after hours by calling Member Services.  Call the Behavioral Health Crisis Line at [phone], at any time, 24 hours a day, 7 days a week. If you are in danger or need immediate medical attention call 911.  Please see education materials related to HTN and CHF provided by MyChart link.  Patient verbalizes understanding of instructions and care  plan provided today and agrees to view in MyChart. Active MyChart status and patient understanding of how to access instructions and care plan via MyChart confirmed with patient.     Telephone follow up appointment with Managed Medicaid care management team member scheduled for: 02/25/24 @ 3pm  Emmry Hinsch, RN, BSN, [employer] RN Care Manager [employer] [phone]

## 2024-02-06 ENCOUNTER — Ambulatory Visit (HOSPITAL_BASED_OUTPATIENT_CLINIC_OR_DEPARTMENT_OTHER): Admitting: General Surgery

## 2024-02-10 ENCOUNTER — Other Ambulatory Visit: Payer: Self-pay | Admitting: Pharmacist

## 2024-02-10 MED ORDER — PAROXETINE HCL 30 MG PO TABS: 30.0000 mg | ORAL_TABLET | Freq: Every day | ORAL | 6 refills | Status: AC

## 2024-02-12 ENCOUNTER — Encounter (HOSPITAL_COMMUNITY): Payer: Self-pay | Admitting: Clinical

## 2024-02-12 ENCOUNTER — Ambulatory Visit (INDEPENDENT_AMBULATORY_CARE_PROVIDER_SITE_OTHER): Payer: Self-pay | Admitting: Clinical

## 2024-02-12 ENCOUNTER — Encounter: Payer: Self-pay | Admitting: Hematology

## 2024-02-12 DIAGNOSIS — F419 Anxiety disorder, unspecified: Secondary | ICD-10-CM

## 2024-02-12 DIAGNOSIS — F32A Depression, unspecified: Secondary | ICD-10-CM

## 2024-02-16 ENCOUNTER — Encounter (HOSPITAL_COMMUNITY): Payer: Self-pay
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
## 2024-02-25 ENCOUNTER — Other Ambulatory Visit: Payer: Self-pay | Admitting: *Deleted

## 2024-02-25 NOTE — Patient Instructions (Signed)
 [name] - I am sorry I was unable to reach you today for our scheduled appointment. I work with [name], MD and am calling to support your healthcare needs. I have rescheduled your appointment for 03/19/24 @ 3 pm.  Please contact me at [phone] at your earliest convenience. I look forward to speaking with you soon.   Thank you,  Hubert Derstine, RN, BSN, ACM RN Care Manager [employer] [phone]

## 2024-03-04 ENCOUNTER — Other Ambulatory Visit (HOSPITAL_COMMUNITY): Payer: Self-pay | Admitting: Physician Assistant

## 2024-03-04 DIAGNOSIS — I5032 Chronic diastolic (congestive) heart failure: Secondary | ICD-10-CM

## 2024-03-08 ENCOUNTER — Ambulatory Visit: Payer: Self-pay | Admitting: *Deleted

## 2024-03-08 ENCOUNTER — Ambulatory Visit: Payer: Self-pay

## 2024-03-08 ENCOUNTER — Other Ambulatory Visit: Payer: Self-pay

## 2024-03-08 ENCOUNTER — Encounter (HOSPITAL_BASED_OUTPATIENT_CLINIC_OR_DEPARTMENT_OTHER): Payer: Self-pay | Admitting: Emergency Medicine

## 2024-03-08 ENCOUNTER — Other Ambulatory Visit: Payer: Self-pay | Admitting: Internal Medicine

## 2024-03-08 ENCOUNTER — Observation Stay (HOSPITAL_BASED_OUTPATIENT_CLINIC_OR_DEPARTMENT_OTHER)
Admission: EM | Admit: 2024-03-08 | Discharge: 2024-03-09 | Disposition: A | Discharge status: Home / Self Care | Attending: Internal Medicine | Admitting: Internal Medicine

## 2024-03-08 ENCOUNTER — Emergency Department (HOSPITAL_BASED_OUTPATIENT_CLINIC_OR_DEPARTMENT_OTHER)

## 2024-03-08 DIAGNOSIS — E1169 Type 2 diabetes mellitus with other specified complication: Secondary | ICD-10-CM | POA: Diagnosis not present

## 2024-03-08 DIAGNOSIS — I5032 Chronic diastolic (congestive) heart failure: Secondary | ICD-10-CM

## 2024-03-08 DIAGNOSIS — E66812 Obesity, class 2: Secondary | ICD-10-CM | POA: Insufficient documentation

## 2024-03-08 DIAGNOSIS — F419 Anxiety disorder, unspecified: Secondary | ICD-10-CM | POA: Diagnosis not present

## 2024-03-08 DIAGNOSIS — G4733 Obstructive sleep apnea (adult) (pediatric): Secondary | ICD-10-CM | POA: Diagnosis not present

## 2024-03-08 DIAGNOSIS — I890 Lymphedema, not elsewhere classified: Secondary | ICD-10-CM | POA: Insufficient documentation

## 2024-03-08 DIAGNOSIS — D649 Anemia, unspecified: Secondary | ICD-10-CM | POA: Diagnosis not present

## 2024-03-08 DIAGNOSIS — L02416 Cutaneous abscess of left lower limb: Secondary | ICD-10-CM | POA: Diagnosis not present

## 2024-03-08 DIAGNOSIS — J4541 Moderate persistent asthma with (acute) exacerbation: Secondary | ICD-10-CM | POA: Diagnosis not present

## 2024-03-08 DIAGNOSIS — I10 Essential (primary) hypertension: Secondary | ICD-10-CM | POA: Diagnosis present

## 2024-03-08 DIAGNOSIS — I872 Venous insufficiency (chronic) (peripheral): Secondary | ICD-10-CM | POA: Diagnosis not present

## 2024-03-08 DIAGNOSIS — Z043 Encounter for examination and observation following other accident: Secondary | ICD-10-CM | POA: Diagnosis not present

## 2024-03-08 DIAGNOSIS — E6609 Other obesity due to excess calories: Secondary | ICD-10-CM

## 2024-03-08 DIAGNOSIS — I5030 Unspecified diastolic (congestive) heart failure: Secondary | ICD-10-CM | POA: Diagnosis present

## 2024-03-08 DIAGNOSIS — Z79899 Other long term (current) drug therapy: Secondary | ICD-10-CM | POA: Insufficient documentation

## 2024-03-08 DIAGNOSIS — L97121 Non-pressure chronic ulcer of left thigh limited to breakdown of skin: Principal | ICD-10-CM

## 2024-03-08 DIAGNOSIS — I110 Hypertensive heart disease with heart failure: Secondary | ICD-10-CM | POA: Diagnosis not present

## 2024-03-08 DIAGNOSIS — Z95828 Presence of other vascular implants and grafts: Secondary | ICD-10-CM | POA: Insufficient documentation

## 2024-03-08 DIAGNOSIS — Z86718 Personal history of other venous thrombosis and embolism: Secondary | ICD-10-CM | POA: Insufficient documentation

## 2024-03-08 DIAGNOSIS — E785 Hyperlipidemia, unspecified: Secondary | ICD-10-CM | POA: Diagnosis not present

## 2024-03-08 DIAGNOSIS — M25511 Pain in right shoulder: Secondary | ICD-10-CM | POA: Diagnosis present

## 2024-03-08 DIAGNOSIS — F32A Depression, unspecified: Secondary | ICD-10-CM | POA: Insufficient documentation

## 2024-03-08 DIAGNOSIS — Z794 Long term (current) use of insulin: Secondary | ICD-10-CM | POA: Diagnosis not present

## 2024-03-08 DIAGNOSIS — Z87891 Personal history of nicotine dependence: Secondary | ICD-10-CM | POA: Insufficient documentation

## 2024-03-08 DIAGNOSIS — M25512 Pain in left shoulder: Secondary | ICD-10-CM | POA: Insufficient documentation

## 2024-03-08 DIAGNOSIS — Z6838 Body mass index (BMI) 38.0-38.9, adult: Secondary | ICD-10-CM | POA: Insufficient documentation

## 2024-03-08 DIAGNOSIS — M19011 Primary osteoarthritis, right shoulder: Secondary | ICD-10-CM | POA: Diagnosis not present

## 2024-03-08 DIAGNOSIS — M778 Other enthesopathies, not elsewhere classified: Secondary | ICD-10-CM | POA: Diagnosis not present

## 2024-03-08 DIAGNOSIS — L03116 Cellulitis of left lower limb: Principal | ICD-10-CM | POA: Insufficient documentation

## 2024-03-08 DIAGNOSIS — M19012 Primary osteoarthritis, left shoulder: Secondary | ICD-10-CM | POA: Diagnosis not present

## 2024-03-08 DIAGNOSIS — M779 Enthesopathy, unspecified: Secondary | ICD-10-CM | POA: Diagnosis not present

## 2024-03-08 DIAGNOSIS — E11621 Type 2 diabetes mellitus with foot ulcer: Secondary | ICD-10-CM | POA: Diagnosis not present

## 2024-03-08 DIAGNOSIS — L97821 Non-pressure chronic ulcer of other part of left lower leg limited to breakdown of skin: Secondary | ICD-10-CM

## 2024-03-08 LAB — CBC WITH DIFFERENTIAL/PLATELET
Abs Immature Granulocytes: 0.01 K/uL (ref 0.00–0.07)
Basophils Absolute: 0 K/uL (ref 0.0–0.1)
Basophils Relative: 0 %
Eosinophils Absolute: 0.1 K/uL (ref 0.0–0.5)
Eosinophils Relative: 2 %
HCT: 33.4 % — ABNORMAL LOW (ref 36.0–46.0)
Hemoglobin: 9.9 g/dL — ABNORMAL LOW (ref 12.0–15.0)
Immature Granulocytes: 0 %
Lymphocytes Relative: 15 %
Lymphs Abs: 0.7 K/uL (ref 0.7–4.0)
MCH: 27.9 pg (ref 26.0–34.0)
MCHC: 29.6 g/dL — ABNORMAL LOW (ref 30.0–36.0)
MCV: 94.1 fL (ref 80.0–100.0)
Monocytes Absolute: 0.5 K/uL (ref 0.1–1.0)
Monocytes Relative: 11 %
Neutro Abs: 3.5 K/uL (ref 1.7–7.7)
Neutrophils Relative %: 72 %
Platelets: 152 K/uL (ref 150–400)
RBC: 3.55 MIL/uL — ABNORMAL LOW (ref 3.87–5.11)
RDW: 14.8 % (ref 11.5–15.5)
WBC: 4.9 K/uL (ref 4.0–10.5)
nRBC: 0 % (ref 0.0–0.2)

## 2024-03-08 LAB — COMPREHENSIVE METABOLIC PANEL WITH GFR
ALT: <5 U/L (ref 0–44)
AST: 14 U/L — ABNORMAL LOW (ref 15–41)
Albumin: 3.9 g/dL (ref 3.5–5.0)
Alkaline Phosphatase: 84 U/L (ref 38–126)
Anion gap: 9 (ref 5–15)
BUN: 9 mg/dL (ref 6–20)
CO2: 30 mmol/L (ref 22–32)
Calcium: 9.5 mg/dL (ref 8.9–10.3)
Chloride: 101 mmol/L (ref 98–111)
Creatinine, Ser: 0.55 mg/dL (ref 0.44–1.00)
GFR, Estimated: >60 mL/min (ref >60)
Glucose, Bld: 93 mg/dL (ref 70–99)
Potassium: 3.5 mmol/L (ref 3.5–5.1)
Sodium: 140 mmol/L (ref 135–145)
Total Bilirubin: 0.5 mg/dL (ref 0.0–1.2)
Total Protein: 7.4 g/dL (ref 6.5–8.1)

## 2024-03-08 LAB — PRO BRAIN NATRIURETIC PEPTIDE: Pro Brain Natriuretic Peptide: 158 pg/mL (ref <300.0)

## 2024-03-08 MED ORDER — OXYCODONE HCL 5 MG PO TABS
5.0000 mg | ORAL_TABLET | Freq: Once | ORAL | Status: AC
  Administered 2024-03-08: 5 mg via ORAL
  Filled 2024-03-08: qty 1

## 2024-03-08 MED ORDER — OXYCODONE HCL 5 MG PO TABS
10.0000 mg | ORAL_TABLET | ORAL | Status: AC
  Administered 2024-03-08: 10 mg via ORAL
  Filled 2024-03-08: qty 2

## 2024-03-08 MED ORDER — LINEZOLID 600 MG/300ML IV SOLN
600.0000 mg | INTRAVENOUS | Status: AC
  Administered 2024-03-08: 600 mg via INTRAVENOUS
  Filled 2024-03-08: qty 300

## 2024-03-08 MED ORDER — LINEZOLID 600 MG PO TABS
600.0000 mg | ORAL_TABLET | Freq: Once | ORAL | Status: DC
  Filled 2024-03-08: qty 1

## 2024-03-08 MED ORDER — FUROSEMIDE 10 MG/ML IJ SOLN
40.0000 mg | Freq: Once | INTRAMUSCULAR | Status: AC
  Administered 2024-03-08: 40 mg via INTRAVENOUS
  Filled 2024-03-08: qty 4

## 2024-03-08 NOTE — ED Notes (Signed)
 ED Provider at bedside.

## 2024-03-08 NOTE — Discharge Instructions (Addendum)
 Your x-ray showed no fracture.  I would recommend following up with primary care and orthopedic provider.  I would recommend following up with wound care, continue keep area clean, dry and covered. Continue taking medicine as prescribed and return to emergency room with new or worsening symptoms.

## 2024-03-08 NOTE — ED Triage Notes (Signed)
 Bilateral shoulder pain since last month Started after moving furniture  Also has wound on left thigh, seen at wound care but wants to get it checked out

## 2024-03-08 NOTE — Progress Notes (Addendum)
 Plan of Care Note for accepted transfer   Patient: [name]: [id number]   DOA: 03/08/2024  Facility requesting transfer: [facility] ED Requesting Provider: Yolande Lamar BROCKS, MD  Reason for transfer: Left thigh cellulitis and abscess with subsequent difficulty in ambulation. Facility course: [name] is a 53 y.o. female with past medical history of venous insufficiency, heart failure with preserved ejection fraction, hypertension, anemia, morbid obesity, history of DVT on Eliquis  for anticoagulation presenting with complaint of bilateral shoulder pain for 3 months after moving heavy furniture.  Patient reports she tried to move her bed on her own when she dropped a bed she started having immediate bilateral shoulder pain.  She notes this difficulty moving he had this for aching sensation.   Also complains of 1 month of wound to anterior left shin and mid thigh.  She has noted significant bilateral extremity swelling make it difficult for her to ambulate and does not better with medications at home.  ED course: Upon presentation to the ER, BP was 140/90 with otherwise normal vital signs.  Labs revealed anemia with hemoglobin 9.9 hematocrit 33.4 slightly worse than previous levels.  CMP revealed borderline potassium of 3.5.  proBNP was 158.  Left shoulder x-ray showed calcific tendinitis with no acute fracture or dislocation.  Right shoulder x-ray showed no acute fracture or dislocation.  It showed mild to moderate degenerative changes of the [facility] joint.  The patient was given 5 mg of p.o. oxycodone , 40 mg of IV Lasix  and 600 mg of IV Zyvox.  Blood cultures were drawn.  Plan of care: The patient is accepted for admission to Med-surg  unit, at [facility]..  The patient will be under the care responsibility for the of of the ED provider until arrival to [facility].  Author: Madison DELENA Peaches, MD 03/08/2024  Check [email or web address] for on-call coverage.  Nursing staff, Please  call [facility] Admits & Consults System-Wide number on Amion as soon as patient's arrival, so appropriate admitting provider can evaluate the pt.

## 2024-03-08 NOTE — ED Provider Notes (Signed)
  Physical Exam  BP 105/83 (BP Location: Right Arm)   Pulse 94   Temp 97.8 F (36.6 C) (Oral)   Resp 16   Ht 5' 9 (1.753 m)   Wt 118.3 kg   SpO2 99%   BMI 38.51 kg/m   Physical Exam  Procedures  Procedures  ED Course / MDM   Clinical Course as of 03/09/24 1526  Mon Mar 08, 2024  1811 8/11 119kg, same today [JB]  1836 WBC: 4.9 [JB]  1837 Hemoglobin(!): 9.9 [JB]  1919 Assumed primary care of the patient from [facility].  In brief, 53 year old female with a history of chronic lower extremity wounds and venous insufficiency who presents to the emergency department with wounds that have been present for months without any significant change and bilateral shoulder pain that started after she was lifting something.  X-ray showed degenerative changes in both shoulders without acute abnormality otherwise. [RP]  2259 Patient reassessed.  Still unable to walk.  Complaining of leg pain.  Given more pain medication and I will start her on linezolid for the area on her upper thighs appears to have some purulent discharge charge.  May benefit from PT/OT evaluation as well as wound care when admitted.  Dr Devon from hospitalist consulted for admission.  [RP]    Clinical Course User Index [JB] Barrett, Warren SAILOR, PA-C [RP] Yolande Lamar BROCKS, MD   Medical Decision Making Amount and/or Complexity of Data Reviewed Labs: ordered. Decision-making details documented in ED Course. Radiology: ordered.  Risk Prescription drug management. Decision regarding hospitalization.     Yolande Lamar BROCKS, MD 03/09/24 [id number]

## 2024-03-08 NOTE — ED Provider Notes (Signed)
 [place] EMERGENCY DEPARTMENT AT [facility] Provider Note   CSN: [id number] Arrival date & time: 03/08/24  1410     Patient presents with: Shoulder Pain and Wound Check   [name] is a 53 y.o. female with past medical history of venous insufficiency, heart failure with preserved ejection fraction, hypertension, anemia, morbid obesity, history of DVT on Eliquis  for anticoagulation presenting with complaint of bilateral shoulder pain for 3 months after moving heavy furniture.  Patient reports she tried to move her bed on her own when she dropped a bed she started having immediate bilateral shoulder pain.  She notes this difficulty moving he had this for aching sensation.   Also complains of 1 month of wound to anterior left shin and mid thigh.  She has noted significant bilateral extremity swelling make it difficult for her to ambulate and does not better with medications at home.    Shoulder Pain Wound Check       Prior to Admission medications   Medication Sig Start Date End Date Taking? Authorizing Provider  acetaminophen  (TYLENOL ) 500 MG tablet Take 1 tablet (500 mg total) by mouth every 8 (eight) hours as needed. 10/24/23   Louder Barnie NOVAK, MD  amLODipine  (NORVASC ) 2.5 MG tablet Take 1 tablet (2.5 mg total) by mouth daily. 10/24/23   Louder Barnie NOVAK, MD  apixaban  (ELIQUIS ) 5 MG TABS tablet Take 1 tablet (5 mg total) by mouth 2 (two) times daily. 12/08/23   Louder Barnie NOVAK, MD  atorvastatin  (LIPITOR) 40 MG tablet Take 1 tablet (40 mg total) by mouth daily. 08/07/23   Louder Barnie NOVAK, MD  cholecalciferol  (VITAMIN D3) 25 MCG (1000 UNIT) tablet Take 1 tablet (1,000 Units total) by mouth daily. 08/07/23   Louder Barnie NOVAK, MD  docusate sodium  (COLACE) 100 MG capsule Take 1 capsule (100 mg total) by mouth 2 (two) times daily as needed for mild constipation. 08/07/23   Louder Barnie NOVAK, MD  FEROSUL 325 (65 Fe) MG tablet TAKE ONE TABLET BY MOUTH DAILY  WITH BREAKFAST 06/06/21   Louder Barnie NOVAK, MD  gabapentin  (NEURONTIN ) 400 MG capsule Take 1 capsule (400 mg total) by mouth 3 (three) times daily. 08/07/23   Louder Barnie NOVAK, MD  methocarbamol  (ROBAXIN ) 500 MG tablet TAKE 1 TABLET BY MOUTH 2 TIMES A DAY AS NEEDED FOR MUSCLE SPASMS 01/04/24   Louder Barnie NOVAK, MD  mometasone -formoterol  (DULERA ) 200-5 MCG/ACT AERO INHALE 2 PUFFS EVERY MORNING AND 2 PUFFS EVERY NIGHT AT BEDTIME 08/07/23   Louder Barnie NOVAK, MD  nitroGLYCERIN  (NITROSTAT ) 0.4 MG SL tablet DISSOLVE 1 TABLET UNDER THE TONGUE AS NEEDED FOR CHEST PAIN EVERY 5 MINUTES UP TO 3 TIMES. IF NO RELIEF CALL 911. Patient taking differently: DISSOLVE 1 TABLET UNDER THE TONGUE AS NEEDED FOR CHEST PAIN EVERY 5 MINUTES UP TO 3 TIMES. IF NO RELIEF CALL 911. 03/17/23   Louder Barnie NOVAK, MD  PARoxetine  (PAXIL ) 30 MG tablet Take 1 tablet (30 mg total) by mouth daily. 02/10/24   Louder Barnie NOVAK, MD  potassium chloride  SA (KLOR-CON  M) 20 MEQ tablet Take 2 tablets (40 mEq total) by mouth daily. 03/06/23   Louder Barnie NOVAK, MD  Semaglutide , 2 MG/DOSE, (OZEMPIC , 2 MG/DOSE,) 8 MG/3ML SOPN DIAL AND INJECT 2 MG UNDER THE SKIN ONCE WEEKLY 10/24/23   Louder Barnie NOVAK, MD  spironolactone  (ALDACTONE ) 25 MG tablet Take 1 tablet (25 mg total) by mouth daily. 03/06/23 02/04/24  Louder Barnie NOVAK, MD  torsemide  (DEMADEX ) 20 MG  tablet Take 2 tablets (40 mg total) by mouth daily. 03/06/23 02/04/24  [name], MD  triamcinolone cream (KENALOG) 0.1 % Apply 1 Application topically daily as needed (skin irritation). 02/08/23   [provider]  vitamin B-12 (CYANOCOBALAMIN ) 1000 MCG tablet Take 1,000 mcg by mouth daily.    [provider]    Allergies: Ace inhibitors, Hydromorphone, Vancomycin, Contrast media [iodinated contrast media], Dilaudid [hydromorphone hcl], Farxiga  [dapagliflozin ], and Lidocaine     Review of Systems  Skin:  Positive for color change.    Updated Vital Signs BP (!)  134/95 (BP Location: Left Arm)   Pulse 90   Temp 97.9 F (36.6 C) (Oral)   Resp 18   Wt 118.8 kg   SpO2 96%   BMI 38.69 kg/m   Physical Exam Vitals and nursing note reviewed.  Constitutional:      General: She is not in acute distress.    Appearance: She is not toxic-appearing.  HENT:     Head: Normocephalic and atraumatic.  Eyes:     General: No scleral icterus.    Conjunctiva/sclera: Conjunctivae normal.  Cardiovascular:     Rate and Rhythm: Normal rate and regular rhythm.     Pulses: Normal pulses.     Heart sounds: Normal heart sounds.  Pulmonary:     Effort: Pulmonary effort is normal. No respiratory distress.     Breath sounds: Normal breath sounds.  Abdominal:     General: Abdomen is flat. Bowel sounds are normal.     Palpations: Abdomen is soft.     Tenderness: There is no abdominal tenderness.  Musculoskeletal:     Right lower leg: Edema present.     Left lower leg: Edema present.     Comments: TTP over bilateral shoulders.  Difficulty with active shoulder flexion above 90 degrees bilaterally.  Sensation intact.  Strong radial pulse equal bilaterally.  Skin:    General: Skin is warm and dry.     Findings: No lesion.     Comments: Wound to left anterior shin and left thigh pictured neurovascularly intact distally.  Neurological:     General: No focal deficit present.     Mental Status: She is alert and oriented to person, place, and time. Mental status is at baseline.           (all labs ordered are listed, but only abnormal results are displayed) Labs Reviewed  CBC WITH DIFFERENTIAL/PLATELET - Abnormal; Notable for the following components:      Result Value   RBC 3.55 (*)    Hemoglobin 9.9 (*)    HCT 33.4 (*)    MCHC 29.6 (*)    All other components within normal limits  COMPREHENSIVE METABOLIC PANEL WITH GFR    EKG: None  Radiology: DG Shoulder Left Result Date: 03/08/2024 CLINICAL DATA:  Fall EXAM: LEFT SHOULDER - 2+ VIEW COMPARISON:   None. FINDINGS: There is no acute fracture or dislocation. There are soft tissue calcifications adjacent to the greater tuberosity likely related to calcific tendinitis. There are minimal degenerative changes of the acromioclavicular joint. IMPRESSION: 1. No acute fracture or dislocation. 2. Calcific tendinitis. Electronically Signed   By: Greig Pique M.D.   On: 03/08/2024 18:43   DG Shoulder Right Result Date: 03/08/2024 CLINICAL DATA:  Fall EXAM: RIGHT SHOULDER - 2+ VIEW COMPARISON:  None Available. FINDINGS: There is no acute fracture or dislocation. There are mild-to-moderate degenerative changes of the acromioclavicular joint with joint space narrowing and osteophyte formation. There  is a small spur off the superolateral aspect of the acromion. Soft tissues are within normal limits. IMPRESSION: 1. No acute fracture or dislocation. 2. Mild-to-moderate degenerative changes of the acromioclavicular joint. Electronically Signed   By: Greig Pique M.D.   On: 03/08/2024 18:42     Procedures   Medications Ordered in the ED  oxyCODONE  (Oxy IR/ROXICODONE ) immediate release tablet 5 mg (has no administration in time range)    Clinical Course as of 03/12/24 0636  Mon Mar 08, 2024  1811 8/11 119kg, same today [JB]  1836 WBC: 4.9 [JB]  1837 Hemoglobin(!): 9.9 [JB]  1919 Assumed primary care of the patient from [facility].  In brief, 53 year old female with a history of chronic lower extremity wounds and venous insufficiency who presents to the emergency department with wounds that have been present for months without any significant change and bilateral shoulder pain that started after she was lifting something.  X-ray showed degenerative changes in both shoulders without acute abnormality otherwise. [RP]  2259 Patient reassessed.  Still unable to walk.  Complaining of leg pain.  Given more pain medication and I will start her on linezolid for the area on her upper thighs appears to have some  purulent discharge charge.  May benefit from PT/OT evaluation as well as wound care when admitted.  Dr Devon from hospitalist consulted for admission.  [RP]    Clinical Course User Index [JB] Seniah Lawrence, Warren SAILOR, PA-C [RP] Yolande Lamar BROCKS, MD                                 Medical Decision Making Amount and/or Complexity of Data Reviewed Labs: ordered. Decision-making details documented in ED Course. Radiology: ordered.  Risk Prescription drug management. Decision regarding hospitalization.   This patient presents to the ED for concern of shoulder pain/wound, this involves an extensive number of treatment options, and is a complaint that carries with it a high risk of complications and morbidity.  The differential diagnosis includes osteomyelitis, deep space infection, cellulitis, wound, CHF, DVT, septic joint   Co morbidities that complicate the patient evaluation  HFpEF, Dvt on Eliquis , OSA, venous insufficiency    Additional history obtained:  Additional history obtained from 01/06/23 Ed visit and admit for similar complaints    Lab Tests:  I personally interpreted labs.  The pertinent results include:   CBC WBC 4.9, Hgb 9.9 chronic but slightly down trending since labs 4 months ago.    Imaging Studies ordered:  I ordered imaging studies including left / right shoulder x-ray   I independently visualized and interpreted imaging which showed no acute findings.  I agree with the radiologist interpretation   Cardiac Monitoring: / EKG:  The patient was maintained on a cardiac monitor.      Problem List / ED Course / Critical interventions / Medication management  Lower extremity swelling and 2 chronic wounds to her left lower leg.  She reports this has been gradually worsening over time.  She reports she has been taking all her medication and has not seen any improvement, has not missed a dose of Eliquis .  She is neurovascularly intact.  Does have significant lower  extremity swelling, but denies any chest pain shortness of breath or cough.  No fever, or cellulitis.  Also patient does not have leukocytosis here.  Given superficial nature of wound not thought to be consistent with osteomyelitis. Patient also complains of bilateral  shoulder pain that has happened since trying to lift a couch.  She does not have any swelling.  She is neurovascularly intact.  Has significant pain with moving bilateral arms above 90 degrees flexion.  Will obtain x-rays to rule out acute fracture given her injury. I ordered medication including Oxycodone  for pain control  Reevaluation of the patient after these medicines showed that the patient improved I have reviewed the patients home medicines and have made adjustments as needed. Reports she is having a hard time walking and concerned about going home signed off to Dr Jakie at shift change for reassessment and disposition.         Final diagnoses:  Ulcer of left thigh, limited to breakdown of skin (HCC)  Ulcer of left shin limited to breakdown of skin [name])    ED Discharge Orders     None          Shermon Warren SAILOR, PA-C 03/12/24 [id number]    Yolande Lamar BROCKS, MD 03/13/24 [phone]

## 2024-03-08 NOTE — Telephone Encounter (Signed)
 Copied from CRM [id number]. Topic: Clinical - Red Word Triage >> Mar 08, 2024  9:07 AM [name] wrote: Red Word that prompted transfer to Nurse Triage: Open wound, says bone is showing on her leg. Open cyst. Also has severe pain in her arms and shoulders. Wants to go to ED.  Call was disconnected before transfer- attempted to call patient back- no answer- left call back message

## 2024-03-08 NOTE — Telephone Encounter (Signed)
 FYI Only or Action Required?: FYI only for provider: ED advised and patient reports bilateral shoulder pain along with the left leg wound has reopened-redness, warmth to the touch.  Patient was last seen in primary care on 12/08/2023 by [name], MD.  Called Nurse Triage reporting Shoulder Pain and Skin Problem.  Symptoms began several weeks ago.  Interventions attempted: Rest, hydration, or home remedies.  Symptoms are: unchanged.  Triage Disposition: Go to ED Now (or PCP Triage), Go to ED Now (Notify PCP)  Patient/caregiver understands and will follow disposition?: Yes  Copied from CRM [id number]. Topic: Clinical - Red Word Triage >> Mar 08, 2024 10:59 AM Dedra NOVAK wrote: Red Word that prompted transfer to Nurse Triage: Pt experiencing pain in shoulders. Pt also has an open, painful wound on L leg that feels really hot. Warm transfer to NT. Reason for Disposition  SEVERE pain in the wound  [1] SEVERE pain AND [2] not improved 2 hours after pain medicine  Answer Assessment - Initial Assessment Questions 1. ONSET: When did the pain start?     Started about a month ago 2. LOCATION: Where is the pain located?     Both shoulders 3. PAIN: How bad is the pain? (Scale 1-10; or mild, moderate, severe)     9 out of 10 4. WORK OR EXERCISE: Has there been any recent work or exercise that involved this part of the body?     no 5. CAUSE: What do you think is causing the shoulder pain?     Patient thinks she may have hurt her shoulder because trying to move her bed.  6. OTHER SYMPTOMS: Do you have any other symptoms? (e.g., neck pain, swelling, rash, fever, numbness, weakness)     Swelling to left side  Answer Assessment - Initial Assessment Questions 1. LOCATION: Where is the wound located?      Left leg 2. WOUND APPEARANCE: What does the wound look like?      Red and raw looking 3. SIZE: If redness is present, ask: What is the size of the red area? (Inches,  centimeters, or compare to size of a coin)      Really big per patient 4. SPREAD: What's changed in the last day?  Do you see any red streaks coming from the wound?     yes 5. ONSET: When did it start to look infected?      Couple of weeks ago 6. MECHANISM: How did the wound start, what was the cause?     About two weeks after finishing with wound care 7. PAIN: Do you have any pain?  If Yes, ask: How bad is the pain?  (e.g., Scale 1-10; mild, moderate, or severe)     10 8. FEVER: Do you have a fever? If Yes, ask: What is your temperature, how was it measured, and when did it start?     no 9. OTHER SYMPTOMS: Do you have any other symptoms? (e.g., shaking chills, weakness, rash elsewhere on body)     Leg is painful, swollen, red and hot to the touch  Protocols used: Shoulder Pain-A-AH, Wound Infection Suspected-A-AH

## 2024-03-09 ENCOUNTER — Inpatient Hospital Stay (HOSPITAL_COMMUNITY)

## 2024-03-09 ENCOUNTER — Encounter: Payer: Self-pay | Admitting: Hematology

## 2024-03-09 ENCOUNTER — Other Ambulatory Visit (HOSPITAL_COMMUNITY): Payer: Self-pay

## 2024-03-09 ENCOUNTER — Encounter (HOSPITAL_COMMUNITY): Payer: Self-pay | Admitting: Family Medicine

## 2024-03-09 DIAGNOSIS — L97929 Non-pressure chronic ulcer of unspecified part of left lower leg with unspecified severity: Secondary | ICD-10-CM | POA: Diagnosis not present

## 2024-03-09 DIAGNOSIS — L02416 Cutaneous abscess of left lower limb: Secondary | ICD-10-CM

## 2024-03-09 DIAGNOSIS — L03116 Cellulitis of left lower limb: Secondary | ICD-10-CM | POA: Diagnosis not present

## 2024-03-09 DIAGNOSIS — R52 Pain, unspecified: Secondary | ICD-10-CM

## 2024-03-09 DIAGNOSIS — I10 Essential (primary) hypertension: Secondary | ICD-10-CM

## 2024-03-09 DIAGNOSIS — L97821 Non-pressure chronic ulcer of other part of left lower leg limited to breakdown of skin: Secondary | ICD-10-CM | POA: Diagnosis present

## 2024-03-09 DIAGNOSIS — E1169 Type 2 diabetes mellitus with other specified complication: Secondary | ICD-10-CM | POA: Diagnosis not present

## 2024-03-09 DIAGNOSIS — L97121 Non-pressure chronic ulcer of left thigh limited to breakdown of skin: Secondary | ICD-10-CM | POA: Diagnosis present

## 2024-03-09 DIAGNOSIS — M7989 Other specified soft tissue disorders: Secondary | ICD-10-CM | POA: Diagnosis not present

## 2024-03-09 DIAGNOSIS — Z743 Need for continuous supervision: Secondary | ICD-10-CM | POA: Diagnosis not present

## 2024-03-09 DIAGNOSIS — R2689 Other abnormalities of gait and mobility: Secondary | ICD-10-CM | POA: Diagnosis not present

## 2024-03-09 DIAGNOSIS — M25511 Pain in right shoulder: Secondary | ICD-10-CM | POA: Diagnosis present

## 2024-03-09 DIAGNOSIS — L97923 Non-pressure chronic ulcer of unspecified part of left lower leg with necrosis of muscle: Secondary | ICD-10-CM | POA: Diagnosis not present

## 2024-03-09 LAB — CBC WITH DIFFERENTIAL/PLATELET
Abs Immature Granulocytes: 0.02 10*3/uL (ref 0.00–0.07)
Basophils Absolute: 0 10*3/uL (ref 0.0–0.1)
Basophils Relative: 0 %
Eosinophils Absolute: 0.1 10*3/uL (ref 0.0–0.5)
Eosinophils Relative: 3 %
HCT: 31.6 % — ABNORMAL LOW (ref 36.0–46.0)
Hemoglobin: 9.1 g/dL — ABNORMAL LOW (ref 12.0–15.0)
Immature Granulocytes: 0 %
Lymphocytes Relative: 12 %
Lymphs Abs: 0.6 10*3/uL — ABNORMAL LOW (ref 0.7–4.0)
MCH: 27.2 pg (ref 26.0–34.0)
MCHC: 28.8 g/dL — ABNORMAL LOW (ref 30.0–36.0)
MCV: 94.6 fL (ref 80.0–100.0)
Monocytes Absolute: 0.5 10*3/uL (ref 0.1–1.0)
Monocytes Relative: 9 %
Neutro Abs: 3.6 10*3/uL (ref 1.7–7.7)
Neutrophils Relative %: 76 %
Platelets: 158 10*3/uL (ref 150–400)
RBC: 3.34 MIL/uL — ABNORMAL LOW (ref 3.87–5.11)
RDW: 14.8 % (ref 11.5–15.5)
WBC: 4.8 10*3/uL (ref 4.0–10.5)
nRBC: 0 % (ref 0.0–0.2)

## 2024-03-09 LAB — BASIC METABOLIC PANEL WITH GFR
Anion gap: 8 (ref 5–15)
BUN: 7 mg/dL (ref 6–20)
CO2: 30 mmol/L (ref 22–32)
Calcium: 9.1 mg/dL (ref 8.9–10.3)
Chloride: 100 mmol/L (ref 98–111)
Creatinine, Ser: 0.5 mg/dL (ref 0.44–1.00)
GFR, Estimated: >60 mL/min
Glucose, Bld: 109 mg/dL — ABNORMAL HIGH (ref 70–99)
Potassium: 3.4 mmol/L — ABNORMAL LOW (ref 3.5–5.1)
Sodium: 139 mmol/L (ref 135–145)

## 2024-03-09 LAB — HEPATIC FUNCTION PANEL
ALT: <5 U/L (ref 0–44)
AST: 14 U/L — ABNORMAL LOW (ref 15–41)
Albumin: 3.5 g/dL (ref 3.5–5.0)
Alkaline Phosphatase: 72 U/L (ref 38–126)
Bilirubin, Direct: 0.2 mg/dL (ref 0.0–0.2)
Indirect Bilirubin: 0.3 mg/dL (ref 0.3–0.9)
Total Bilirubin: 0.5 mg/dL (ref 0.0–1.2)
Total Protein: 6.9 g/dL (ref 6.5–8.1)

## 2024-03-09 LAB — HEMOGLOBIN A1C
Hgb A1c MFr Bld: 5 % (ref 4.8–5.6)
Mean Plasma Glucose: 96.8 mg/dL

## 2024-03-09 LAB — GLUCOSE, CAPILLARY
Glucose-Capillary: 101 mg/dL — ABNORMAL HIGH (ref 70–99)
Glucose-Capillary: 101 mg/dL — ABNORMAL HIGH (ref 70–99)

## 2024-03-09 LAB — HIV ANTIBODY (ROUTINE TESTING W REFLEX): HIV Screen 4th Generation wRfx: NONREACTIVE

## 2024-03-09 MED ORDER — ACETAMINOPHEN 650 MG RE SUPP: 650.0000 mg | Freq: Four times a day (QID) | RECTAL | Status: DC | PRN

## 2024-03-09 MED ORDER — GABAPENTIN 400 MG PO CAPS
400.0000 mg | ORAL_CAPSULE | Freq: Three times a day (TID) | ORAL | 0 refills | Status: AC
  Filled 2024-03-09: qty 90, 30d supply, fill #0

## 2024-03-09 MED ORDER — ACETAMINOPHEN 325 MG PO TABS
650.0000 mg | ORAL_TABLET | Freq: Four times a day (QID) | ORAL | Status: DC | PRN
  Administered 2024-03-09 (×2): 650 mg via ORAL
  Filled 2024-03-09 (×2): qty 2

## 2024-03-09 MED ORDER — FLUTICASONE FUROATE-VILANTEROL 200-25 MCG/ACT IN AEPB
1.0000 | INHALATION_SPRAY | Freq: Every day | RESPIRATORY_TRACT | Status: DC
  Administered 2024-03-09: 1 via RESPIRATORY_TRACT
  Filled 2024-03-09: qty 28

## 2024-03-09 MED ORDER — SODIUM CHLORIDE 0.9 % IV SOLN
2.0000 g | Freq: Three times a day (TID) | INTRAVENOUS | Status: DC
  Administered 2024-03-09: 2 g via INTRAVENOUS
  Filled 2024-03-09: qty 12.5

## 2024-03-09 MED ORDER — VITAMIN B-12 1000 MCG PO TABS
1000.0000 ug | ORAL_TABLET | Freq: Every day | ORAL | Status: DC
  Administered 2024-03-09: 1000 ug via ORAL
  Filled 2024-03-09: qty 1

## 2024-03-09 MED ORDER — ATORVASTATIN CALCIUM 40 MG PO TABS
40.0000 mg | ORAL_TABLET | Freq: Every day | ORAL | Status: DC
  Administered 2024-03-09: 40 mg via ORAL
  Filled 2024-03-09: qty 1

## 2024-03-09 MED ORDER — APIXABAN 5 MG PO TABS
5.0000 mg | ORAL_TABLET | Freq: Two times a day (BID) | ORAL | Status: DC
  Administered 2024-03-09 (×2): 5 mg via ORAL
  Filled 2024-03-09 (×2): qty 1

## 2024-03-09 MED ORDER — OXYCODONE HCL 5 MG PO TABS
5.0000 mg | ORAL_TABLET | Freq: Once | ORAL | Status: AC
  Administered 2024-03-09: 5 mg via ORAL
  Filled 2024-03-09: qty 1

## 2024-03-09 MED ORDER — CEFADROXIL 500 MG PO CAPS
1000.0000 mg | ORAL_CAPSULE | Freq: Two times a day (BID) | ORAL | 0 refills | Status: AC
  Filled 2024-03-09: qty 28, 7d supply, fill #0

## 2024-03-09 MED ORDER — PAROXETINE HCL 10 MG PO TABS
30.0000 mg | ORAL_TABLET | Freq: Every day | ORAL | Status: DC
  Administered 2024-03-09: 30 mg via ORAL
  Filled 2024-03-09: qty 3

## 2024-03-09 MED ORDER — AMLODIPINE BESYLATE 2.5 MG PO TABS
2.5000 mg | ORAL_TABLET | Freq: Every day | ORAL | 0 refills | Status: DC
  Filled 2024-03-09: qty 30, 30d supply, fill #0

## 2024-03-09 MED ORDER — TORSEMIDE 20 MG PO TABS
40.0000 mg | ORAL_TABLET | Freq: Every day | ORAL | Status: DC
  Administered 2024-03-09: 40 mg via ORAL
  Filled 2024-03-09: qty 2

## 2024-03-09 MED ORDER — FERROUS SULFATE 325 (65 FE) MG PO TABS
325.0000 mg | ORAL_TABLET | Freq: Every day | ORAL | Status: DC
  Administered 2024-03-09: 325 mg via ORAL
  Filled 2024-03-09: qty 1

## 2024-03-09 MED ORDER — AMLODIPINE BESYLATE 5 MG PO TABS
2.5000 mg | ORAL_TABLET | Freq: Every day | ORAL | Status: DC
  Administered 2024-03-09: 2.5 mg via ORAL
  Filled 2024-03-09: qty 1

## 2024-03-09 MED ORDER — INSULIN ASPART 100 UNIT/ML IJ SOLN: 0.0000 [IU] | Freq: Three times a day (TID) | INTRAMUSCULAR | Status: DC

## 2024-03-09 MED ORDER — CEFADROXIL 500 MG PO CAPS
1000.0000 mg | ORAL_CAPSULE | Freq: Two times a day (BID) | ORAL | Status: DC
  Administered 2024-03-09: 1000 mg via ORAL
  Filled 2024-03-09 (×2): qty 2

## 2024-03-09 MED ORDER — POTASSIUM CHLORIDE 20 MEQ PO PACK
40.0000 meq | PACK | Freq: Once | ORAL | Status: AC
  Administered 2024-03-09: 40 meq via ORAL
  Filled 2024-03-09: qty 2

## 2024-03-09 MED ORDER — ACETAMINOPHEN 325 MG PO TABS: 650.0000 mg | ORAL_TABLET | Freq: Four times a day (QID) | ORAL | Status: DC | PRN

## 2024-03-09 MED ORDER — SPIRONOLACTONE 25 MG PO TABS
25.0000 mg | ORAL_TABLET | Freq: Every day | ORAL | Status: DC
  Administered 2024-03-09: 25 mg via ORAL
  Filled 2024-03-09: qty 1

## 2024-03-09 MED ORDER — LINEZOLID 600 MG/300ML IV SOLN
600.0000 mg | Freq: Two times a day (BID) | INTRAVENOUS | Status: DC
  Filled 2024-03-09: qty 300

## 2024-03-09 NOTE — Assessment & Plan Note (Signed)
 Increased edema recently Was given 1 dose of IV Lasix  Negative DVT US  Continue torsemide  and spironolactone 

## 2024-03-09 NOTE — Evaluation (Signed)
 Occupational Therapy Evaluation Patient Details Name: [name] MRN: [id number] DOB: March 12, 1971 Today's Date: 03/09/2024   History of Present Illness   [name] is a 53 y.o. female  of the LE presents to the ER and now obs status at [facility] with complaints of B shoulder pain after moving furniture's.  Patient also has chronic wounds of the left lower extremity which has healed and used to follow-up with wound clinic PMH: c/o chronic HFpEF, hypertension, sleep apnea, diabetes mellitus type 2, chronic wounds     Clinical Impressions Patient evaluated by Occupational Therapy with no further acute OT needs identified. All education has been completed and the patient has no further questions. OT issued and trained in reacher use for item retrieval, functional reach and safety with + teach back.  See below for any follow-up Occupational Therapy or equipment needs. OT is signing off. Thank you for this referral.      If plan is discharge home, recommend the following:   A little help with walking and/or transfers;A little help with bathing/dressing/bathroom;Assistance with cooking/housework;Assist for transportation;Help with stairs or ramp for entrance     Functional Status Assessment   Patient has had a recent decline in their functional status and demonstrates the ability to make significant improvements in function in a reasonable and predictable amount of time.     Equipment Recommendations   Other (comment) (OT issued and trained in use of reacher)      Precautions/Restrictions   Precautions Precautions: Fall Restrictions Weight Bearing Restrictions Per Provider Order: No     Mobility Bed Mobility Overal bed mobility: Needs Assistance Bed Mobility: Supine to Sit     Supine to sit: Modified independent (Device/Increase time)     General bed mobility comments: incr time, no physical assist    Transfers Overall transfer level: Needs  assistance Equipment used: Rolling walker (2 wheels), None Transfers: Sit to/from Stand Sit to Stand: Supervision                  Balance Overall balance assessment: Needs assistance Sitting-balance support: Feet supported, No upper extremity supported Sitting balance-Leahy Scale: Good     Standing balance support: Reliant on assistive device for balance, During functional activity, No upper extremity supported Standing balance-Leahy Scale: Fair Standing balance comment: pt is able to perform pericare without UE support, stand at sink, wash hands; RW used for safety--light use for gait                           ADL either performed or assessed with clinical judgement   ADL Overall ADL's : Needs assistance/impaired Eating/Feeding: Independent   Grooming: Oral care;Wash/dry hands;Wash/dry face;Sitting;Modified independent   Upper Body Bathing: Modified independent;Sitting   Lower Body Bathing: Sit to/from stand;Set up Lower Body Bathing Details (indicate cue type and reason): sinkside and toilet level Upper Body Dressing : Modified independent;Sitting   Lower Body Dressing: Sit to/from stand;Supervision/safety Lower Body Dressing Details (indicate cue type and reason): WBOS, isued reacher for assist Toilet Transfer: Supervision/safety;Regular Toilet;Grab bars;Rolling walker (2 wheels)   Toileting- Clothing Manipulation and Hygiene: Set up;Sit to/from stand Toileting - Clothing Manipulation Details (indicate cue type and reason): including peri and buttocks reach     Functional mobility during ADLs: Supervision/safety;Rolling walker (2 wheels) General ADL Comments: educated on safety and use of reacher and figure 4 techniques     Vision Baseline Vision/History: 0 No visual deficits;1 Wears glasses  Pertinent Vitals/Pain Pain Assessment Pain Assessment: Faces Faces Pain Scale: Hurts little more Pain Location: LEs, shoulders Pain  Descriptors / Indicators: Aching, Discomfort Pain Intervention(s): Monitored during session, Premedicated before session, Repositioned     Extremity/Trunk Assessment Upper Extremity Assessment Upper Extremity Assessment: Right hand dominant (able to demo functionally full AROM and UE use for all ADL's including peri hygiene with external rotation but demonstrated AROM 0-130 Bly with testing)   Lower Extremity Assessment Lower Extremity Assessment: Defer to PT evaluation   Cervical / Trunk Assessment Cervical / Trunk Assessment: Other exceptions (body habitus)   Communication Communication Communication: Impaired Factors Affecting Communication: Difficulty expressing self (occasional delays in conveying ideas verbally but intact with time)   Cognition Arousal: Alert Behavior During Therapy: WFL for tasks assessed/performed Cognition: No apparent impairments             OT - Cognition Comments: increased processing time for conveying verbal language                 Following commands: Intact       Cueing  General Comments   Cueing Techniques: Verbal cues  LE wounds covered with intact dressings on inner thigh and was able to complete peri hygiene and drying with set up only, min cues for pacing           Home Living Family/patient expects to be discharged to:: Private residence Living Arrangements: Parent Available Help at Discharge: Family;Available 24 hours/day Type of Home: Apartment Home Access: Level entry     Home Layout: One level     Bathroom Shower/Tub: Tub/shower unit;Curtain   Bathroom Toilet: Handicapped height     Home Equipment: Agricultural Consultant (2 wheels);Cane - single point;Hand held shower head;Other (comment)   Additional Comments: adjustable bed      Prior Functioning/Environment Prior Level of Function : Independent/Modified Independent             Mobility Comments: independent household ambulator; does not drive; denies  falls ADLs Comments: independent with ADLs    OT Problem List: Decreased range of motion;Decreased activity tolerance;Impaired balance (sitting and/or standing);Obesity;Pain;Increased edema;Other (comment) (skin integrity)             Co-evaluation   Reason for Co-Treatment: To address functional/ADL transfers PT goals addressed during session: Mobility/safety with mobility OT goals addressed during session: ADL's and self-care      AM-PAC OT 6 Clicks Daily Activity     Outcome Measure Help from another person eating meals?: None Help from another person taking care of personal grooming?: None Help from another person toileting, which includes using toliet, bedpan, or urinal?: A Little Help from another person bathing (including washing, rinsing, drying)?: A Little Help from another person to put on and taking off regular upper body clothing?: None Help from another person to put on and taking off regular lower body clothing?: A Little 6 Click Score: 21   End of Session Equipment Utilized During Treatment: Gait belt;Rolling walker (2 wheels) Nurse Communication: Mobility status  Activity Tolerance: Patient tolerated treatment well Patient left: in chair;with call bell/phone within reach;with chair alarm set  OT Visit Diagnosis: Unsteadiness on feet (R26.81);Pain Pain - part of body: Leg;Shoulder (Bilaterally)                Time: 8844-8784 OT Time Calculation (min): 20 min Charges:  OT General Charges $OT Visit: 1 Visit OT Evaluation $OT Eval Low Complexity: 1 Low Indica Marcott OT/L Acute Rehabilitation Department  [phone]  03/09/2024,  1:50 PM

## 2024-03-09 NOTE — Assessment & Plan Note (Deleted)
 Continue amlodipine  but consider dc - could be contributing to edema Continue torsemide  and spironolactone 

## 2024-03-09 NOTE — Assessment & Plan Note (Addendum)
 Body mass index is 38.51 kg/m.SABRA  Weight loss should be encouraged on an ongoing basis Outpatient PCP/bariatric medicine f/u encouraged Significantly low or high BMI is associated with higher medical risk including morbidity and mortality

## 2024-03-09 NOTE — Plan of Care (Signed)

## 2024-03-09 NOTE — Consult Note (Signed)
 WOC Nurse Consult Note: patient has been followed off and on since 2022 for recurrent lower extremity wounds related to lymphedema and venous insufficiency; last seen in their office 01/28/2024 with all wounds deemed healed  Reason for Consult: L thigh and L shin wound  Wound type: 1. Full thickness L thigh unknown etiology pink moist  2.  L lower leg full thickness r/t venous insufficiency red dry appearing  Pressure Injury POA: NA not pressure  Measurement: see nursing flowsheet  Wound bed: as above  Drainage (amount, consistency, odor) see nursing flowsheet  Periwound: intact  Dressing procedure/placement/frequency: Cleanse L thigh wound with Vashe, do not rinse and allow to air dry.  Using a Q tip applicator apply a piece of silver hydrofiber Soila [phone]) to wound bed daily and secure with silicone foam or ABD pad and tape.  Cleanse L lower leg wound with Vashe, allow to air dry. Apply Xeroform gauze Soila [phone]) to wound bed daily, cover with dry gauze and secure with silicone foam or cover with ABD pad and secure with Kerlix roll gauze beginning right above toes and ending right below knees.    POC discussed with bedside nurse. WOC team will not follow. Patient should return to wound care center for ongoing management of wounds.   Thank you    Powell Bar MSN, RN-BC, CWOCN

## 2024-03-09 NOTE — Assessment & Plan Note (Signed)
 Followed by Dr. Onesimo Mary to be stable Continue iron supplements

## 2024-03-09 NOTE — Assessment & Plan Note (Deleted)
 Body mass index is 38.51 kg/m.[name]  Weight loss should be encouraged on an ongoing basis Outpatient PCP/bariatric medicine f/u encouraged Significantly low or high BMI is associated with higher medical risk including morbidity and mortality

## 2024-03-09 NOTE — Assessment & Plan Note (Deleted)
 patient has 2 wounds on the left lower extremity and one on the distal aspect of the tibia and also one on the mid thigh.  Per patient patient had some purulent discharge.  Was started on empiric antibiotics and cultures obtained.   Wound care consulted Recommend outpatient wound care follow up

## 2024-03-09 NOTE — Assessment & Plan Note (Addendum)
 Continue Dulera  Appears compensated

## 2024-03-09 NOTE — Assessment & Plan Note (Deleted)
 increased edema recently. Was given 1 dose of IV Lasix . Will check Dopplers. Continue torsemide  and spironolactone .

## 2024-03-09 NOTE — Discharge Summary (Addendum)
 Physician Discharge Summary   Patient: [name] MRN: [id number] DOB: March 13, 1971  Admit date:     03/08/2024  Discharge date: 03/09/24  Discharge Physician: Delon Herald   PCP: Louder Barnie NOVAK, MD   Recommendations at discharge:   Complete antibiotics - Cefadroxil twice daily x 7 days Follow up with Dr. Louder in 1-2 weeks [facility] Follow up with [facility] for shoulder pain Take Tylenol  as needed for pain  Discharge Diagnoses: Principal Problem:   Cellulitis and abscess of left lower extremity Active Problems:   Lymphedema   S/P insertion of IVC (inferior vena caval) filter   Chronic anemia   Essential hypertension   History of DVT (deep vein thrombosis)   Class 2 obesity due to excess calories with body mass index (BMI) of 38.0 to 38.9 in adult   Anxiety and depression   OSA (obstructive sleep apnea)   Hyperlipidemia associated with type 2 diabetes mellitus (HCC)   Type 2 diabetes mellitus with morbid obesity (HCC)   Venous insufficiency   (HFpEF) heart failure with preserved ejection fraction (HCC) -> although echo suggests normal diastolic parameters with normal left atrial size   Asthma, chronic, moderate persistent, with acute exacerbation   Bilateral shoulder pain   Hospital Course: 53yo with h/o chronic HFpEF, HTN, OSA, T2DM, and chronic LE wounds who presented on 11/10 with B shoulder pain after moving furniture.  Also with worsening BLE wounds.  Concern for LE cellulitis, wounds cultured and started on antibiotics.  Given Lasix  x1, negative pro-BNP.  Wound care consulted.  Assessment and Plan:  Assessment & Plan Cellulitis and abscess of left lower extremity Patient has 2 wounds on the left lower extremity and one on the distal aspect of the tibia and also one on the mid thigh Per patient patient had some purulent discharge Was started on empiric antibiotics and cultures obtained No evidence of sepsis Will  give cefuroxmine Wound care consulted Recommend outpatient wound care follow up Stable for dc Lymphedema Venous insufficiency Increased edema recently Was given 1 dose of IV Lasix  Negative DVT US  Continue torsemide  and spironolactone  Bilateral shoulder pain Imaging c/w OA Outpatient f/u with orthopedics S/P insertion of IVC (inferior vena caval) filter History of DVT (deep vein thrombosis) Continue Eliquis  Chronic anemia Followed by Dr. Onesimo Mary to be stable Continue iron supplements Essential hypertension Continue amlodipine  but consider dc as an outpatient - could be contributing to edema Continue torsemide  and spironolactone  OSA (obstructive sleep apnea) Continue CPAP Hyperlipidemia associated with type 2 diabetes mellitus (HCC) Continue statin Type 2 diabetes mellitus with morbid obesity (HCC) A1c 5, excellent control Resume Trulicity , consider trial off medication Carb modified diet  (HFpEF) heart failure with preserved ejection fraction (HCC) -> although echo suggests normal diastolic parameters with normal left atrial size Compensated despite LE edema (lymphedema) Given IV Lasix  x 1 Continue torsemide  and spironolactone  Asthma, chronic, moderate persistent, with acute exacerbation Continue Dulera  Appears compensated Anxiety and depression Continue paroxetine  Class 2 obesity due to excess calories with body mass index (BMI) of 38.0 to 38.9 in adult Body mass index is 38.51 kg/m.[name]  Weight loss should be encouraged on an ongoing basis Outpatient PCP/bariatric medicine f/u encouraged Significantly low or high BMI is associated with higher medical risk including morbidity and mortality       Consultants: Wound care   Procedures: None   Antibiotics: Cefepime  x 1 Linezolid x 1 Cefadroxil x 7 days     Pain control -  [place]  Controlled Substance Reporting System database was reviewed. and patient was instructed, not to drive, operate heavy  machinery, perform activities at heights, swimming or participation in water activities or provide baby-sitting services while on Pain, Sleep and Anxiety Medications; until their outpatient Physician has advised to do so again. Also recommended to not to take more than prescribed Pain, Sleep and Anxiety Medications.   Disposition: Home Diet recommendation:  Cardiac and Carb modified diet DISCHARGE MEDICATION: Allergies as of 03/09/2024       Reactions   Ace Inhibitors Rash   Make pt bleed   Hydromorphone Hives, Itching   Vancomycin Itching, Rash   Contrast Media [iodinated Contrast Media] Hives   Dilaudid [hydromorphone Hcl] Hives   Farxiga  [dapagliflozin ] Other (See Comments)   Vaginal yeast   Lidocaine  Itching   Itching with Lidocaine  patch reported 06/13/2021 vis telephone message.        Medication List     STOP taking these medications    potassium chloride  SA 20 MEQ tablet Commonly known as: KLOR-CON  M       TAKE these medications    acetaminophen  325 MG tablet Commonly known as: TYLENOL  Take 2 tablets (650 mg total) by mouth every 6 (six) hours as needed for mild pain (pain score 1-3) or fever (or Fever >/= 101). Notes to patient: Last dose taken: 11/11 09:40am   amLODipine  2.5 MG tablet Commonly known as: NORVASC  Take 1 tablet (2.5 mg total) by mouth daily.   apixaban  5 MG Tabs tablet Commonly known as: Eliquis  Take 1 tablet (5 mg total) by mouth 2 (two) times daily.   atorvastatin  40 MG tablet Commonly known as: LIPITOR Take 1 tablet (40 mg total) by mouth daily.   cefadroxil 500 MG capsule Commonly known as: DURICEF Take 2 capsules (1,000 mg total) by mouth 2 (two) times daily for 7 days.   cholecalciferol  25 MCG (1000 UNIT) tablet Commonly known as: VITAMIN D3 Take 1 tablet (1,000 Units total) by mouth daily. Notes to patient: Resume home regimen   cyanocobalamin  1000 MCG tablet Commonly known as: VITAMIN B12 Take 1,000 mcg by mouth daily.    docusate sodium  100 MG capsule Commonly known as: COLACE Take 1 capsule (100 mg total) by mouth 2 (two) times daily as needed for mild constipation. What changed: when to take this Notes to patient: Resume home regimen   Dulera  200-5 MCG/ACT Aero Generic drug: mometasone -formoterol  INHALE 2 PUFFS EVERY MORNING AND 2 PUFFS EVERY NIGHT AT BEDTIME What changed:  how much to take how to take this when to take this reasons to take this additional instructions Notes to patient: Resume home regimen   FeroSul 325 (65 Fe) MG tablet Generic drug: ferrous sulfate  TAKE ONE TABLET BY MOUTH DAILY WITH BREAKFAST What changed:  how much to take when to take this   gabapentin  400 MG capsule Commonly known as: NEURONTIN  Take 1 capsule (400 mg total) by mouth 3 (three) times daily.   melatonin 3 MG Tabs tablet Take 3 mg by mouth at bedtime. Notes to patient: Resume home regimen   methocarbamol  500 MG tablet Commonly known as: ROBAXIN  TAKE 1 TABLET BY MOUTH 2 TIMES A DAY AS NEEDED FOR MUSCLE SPASMS What changed: See the new instructions. Notes to patient: Resume home regimen   nitroGLYCERIN  0.4 MG SL tablet Commonly known as: NITROSTAT  DISSOLVE 1 TABLET UNDER THE TONGUE AS NEEDED FOR CHEST PAIN EVERY 5 MINUTES UP TO 3 TIMES. IF NO RELIEF CALL 911. What changed:  See the new instructions. Notes to patient: Resume home regimen   Ozempic  (2 MG/DOSE) 8 MG/3ML Sopn Generic drug: Semaglutide  (2 MG/DOSE) DIAL AND INJECT 2 MG UNDER THE SKIN ONCE WEEKLY What changed:  how much to take how to take this when to take this additional instructions Notes to patient: Resume home regimen   PARoxetine  30 MG tablet Commonly known as: PAXIL  Take 1 tablet (30 mg total) by mouth daily.   Potassium Chloride  ER 20 MEQ Tbcr Take 20 mEq by mouth daily as needed (cramping).   spironolactone  25 MG tablet Commonly known as: ALDACTONE  Take 1 tablet (25 mg total) by mouth daily.   torsemide  20 MG  tablet Commonly known as: DEMADEX  Take 2 tablets (40 mg total) by mouth daily. What changed:  how much to take when to take this   triamcinolone cream 0.1 % Commonly known as: KENALOG Apply 1 Application topically daily as needed (skin irritation). Notes to patient: Resume home regimen               Durable Medical Equipment  (From admission, onward)           Start     Ordered   03/09/24 0000  For home use only DME Walker rolling       Question Answer Comment  Walker: With 5 Inch Wheels   Patient needs a walker to treat with the following condition Ambulatory dysfunction      03/09/24 1420              Discharge Care Instructions  (From admission, onward)           Start     Ordered   03/09/24 0000  Discharge wound care:       Comments: Cleanse L thigh wound with Vashe, do not rinse and allow to air dry.  Using a Q tip applicator apply a piece of silver hydrofiber to wound bed daily and secure with silicone foam or ABD pad and tape.   Cleanse L lower leg wound with Vashe, allow to air dry. Apply Xeroform gauze to wound bed daily, cover with dry gauze and secure with silicone foam or cover with ABD pad and secure with Kerlix roll gauze beginning right above toes and ending right below knees   03/09/24 1257            Follow-up Information     [name], MD .   Specialty: Internal Medicine Contact information: [address] [place] [address] [phone]         [facility]   Specialty: Specialist Why: Follow up for shoulder pain. Contact information: [name] [place] [address] [phone]         [facility]             . Schedule an appointment as soon as possible for a visit.   Contact information: [name] [place]  [address] [phone]               Discharge Exam:   Subjective: Complained  of pain and reported an inability to take Tylenol  (I am allergic to ACE-inhibitors, took Tylenol  at 3am without difficulty per RN), requesting morphine .  Explained that she likely can be discharged today and does not require morphine .  She reported inability to walk, offered PT/OT consults and found to be at baseline.  Reported wounds, encouraged to go to  wound care, she reported that she doesn't need to go there anymore.   Objective: Vitals:   03/09/24 0847 03/09/24 1322  BP: 111/76 105/83  Pulse: 99 94  Resp: 19 16  Temp: 98 F (36.7 C) 97.8 F (36.6 C)  SpO2: 96% 99%    Intake/Output Summary (Last 24 hours) at 03/09/2024 1420 Last data filed at 03/09/2024 0848 Gross per 24 hour  Intake 629.82 ml  Output --  Net 629.82 ml   Filed Weights   03/08/24 1831 03/09/24 0129  Weight: 118.8 kg 118.3 kg    Exam:  General:  Appears calm and comfortable and is in NAD, asking for morphine  Eyes:  normal lids, iris ENT:  grossly normal hearing, lips & tongue, mmm Cardiovascular:  RRR Respiratory:   CTA bilaterally with no wheezes/rales/rhonchi.  Normal respiratory effort. Abdomen:  soft, NT, ND Skin:  wound of L thigh and L lateral lower leg, both dressed by wound care Musculoskeletal:  grossly normal tone BUE/BLE, no bony abnormality; exam limited by habitus Psychiatric:  grossly normal mood and affect, speech fluent and appropriate, AOx3 Neurologic:  CN 2-12 grossly intact, moves all extremities in coordinated fashion  Data Reviewed: I have reviewed the patient's lab results since admission.  Pertinent labs for today include:   K+ 3.4, repleted Glucose 109 WBC 4.8 Hgb 9.1, down from 9.9 Blood cultures pending    Condition at discharge: stable  The results of significant diagnostics from this hospitalization (including imaging, microbiology, ancillary and laboratory) are listed below for reference.   Imaging Studies: VAS US  LOWER EXTREMITY VENOUS (DVT) Result Date:  03/09/2024  Lower Venous DVT Study Patient Name:  KEMIYA BATDORF  Date of Exam:   03/09/2024 Medical Rec #: [id number]                Accession #:    [id number] Date of Birth: [birth year]                 Patient Gender: F Patient Age:   72 years Exam Location:  [facility] Procedure:      VAS US  LOWER EXTREMITY VENOUS (DVT) Referring Phys: REDIA CLEAVER --------------------------------------------------------------------------------  Indications: Swelling, Pain, and Left lower leg chronic wound.  Risk Factors: DVT History in 08/2017 obesity and PVD, venous insufficiency. Limitations: Body habitus. Comparison Study: 12/10/22 - Negative Performing Technologist: Ricka Sturdivant-Jones RDMS, RVT  Examination Guidelines: A complete evaluation includes B-mode imaging, spectral Doppler, color Doppler, and power Doppler as needed of all accessible portions of each vessel. Bilateral testing is considered an integral part of a complete examination. Limited examinations for reoccurring indications may be performed as noted. The reflux portion of the exam is performed with the patient in reverse Trendelenburg.  +---------+---------------+---------+-----------+----------+-------------------+ RIGHT    CompressibilityPhasicitySpontaneityPropertiesThrombus Aging      +---------+---------------+---------+-----------+----------+-------------------+ CFV      Full           Yes      Yes                                      +---------+---------------+---------+-----------+----------+-------------------+ SFJ      Full                                                             +---------+---------------+---------+-----------+----------+-------------------+  FV Prox  Full                                                             +---------+---------------+---------+-----------+----------+-------------------+ FV Mid   Full           Yes      Yes                                       +---------+---------------+---------+-----------+----------+-------------------+ FV DistalFull           Yes      Yes                                      +---------+---------------+---------+-----------+----------+-------------------+ PFV      Full                                                             +---------+---------------+---------+-----------+----------+-------------------+ POP      Full           No       Yes                                      +---------+---------------+---------+-----------+----------+-------------------+ PTV      Full                                                             +---------+---------------+---------+-----------+----------+-------------------+ PERO                                                  Appears patent with                                                       color               +---------+---------------+---------+-----------+----------+-------------------+   +---------+---------------+---------+-----------+----------+-----------------+ LEFT     CompressibilityPhasicitySpontaneityPropertiesThrombus Aging    +---------+---------------+---------+-----------+----------+-----------------+ CFV      Full           Yes      Yes                                    +---------+---------------+---------+-----------+----------+-----------------+ SFJ      Full                                                           +---------+---------------+---------+-----------+----------+-----------------+  FV Prox  Full                                                           +---------+---------------+---------+-----------+----------+-----------------+ FV Mid   Full           Yes      Yes                                    +---------+---------------+---------+-----------+----------+-----------------+ FV Distal               Yes      Yes                  Patent with color  +---------+---------------+---------+-----------+----------+-----------------+ PFV      Full                                                           +---------+---------------+---------+-----------+----------+-----------------+ POP      Full           Yes      Yes                                    +---------+---------------+---------+-----------+----------+-----------------+ PTV      Full                                                           +---------+---------------+---------+-----------+----------+-----------------+ PERO     Full                                                           +---------+---------------+---------+-----------+----------+-----------------+    Summary: BILATERAL: - No evidence of deep vein thrombosis seen in the lower extremities, bilaterally. -No evidence of popliteal cyst, bilaterally.   *See table(s) above for measurements and observations.    Preliminary    DG Shoulder Left Result Date: 03/08/2024 CLINICAL DATA:  Fall EXAM: LEFT SHOULDER - 2+ VIEW COMPARISON:  None. FINDINGS: There is no acute fracture or dislocation. There are soft tissue calcifications adjacent to the greater tuberosity likely related to calcific tendinitis. There are minimal degenerative changes of the acromioclavicular joint. IMPRESSION: 1. No acute fracture or dislocation. 2. Calcific tendinitis. Electronically Signed   By: Greig Pique M.D.   On: 03/08/2024 18:43   DG Shoulder Right Result Date: 03/08/2024 CLINICAL DATA:  Fall EXAM: RIGHT SHOULDER - 2+ VIEW COMPARISON:  None Available. FINDINGS: There is no acute fracture or dislocation. There are mild-to-moderate degenerative changes of the acromioclavicular joint with joint space narrowing and osteophyte formation. There is a small spur off the superolateral aspect of the acromion. Soft tissues are within  normal limits. IMPRESSION: 1. No acute fracture or dislocation. 2. Mild-to-moderate degenerative changes of the  acromioclavicular joint. Electronically Signed   By: Greig Pique M.D.   On: 03/08/2024 18:42    Microbiology: Results for orders placed or performed during the hospital encounter of 11/25/22  Resp panel by RT-PCR (RSV, Flu A&B, Covid) Anterior Nasal Swab     Status: None   Collection Time: 11/25/22  7:29 PM   Specimen: Anterior Nasal Swab  Result Value Ref Range Status   SARS Coronavirus 2 by RT PCR NEGATIVE NEGATIVE Final    Comment: (NOTE) SARS-CoV-2 target nucleic acids are NOT DETECTED.  The SARS-CoV-2 RNA is generally detectable in upper respiratory specimens during the acute phase of infection. The lowest concentration of SARS-CoV-2 viral copies this assay can detect is 138 copies/mL. A negative result does not preclude SARS-Cov-2 infection and should not be used as the sole basis for treatment or other patient management decisions. A negative result may occur with  improper specimen collection/handling, submission of specimen other than nasopharyngeal swab, presence of viral mutation(s) within the areas targeted by this assay, and inadequate number of viral copies(<138 copies/mL). A negative result must be combined with clinical observations, patient history, and epidemiological information. The expected result is Negative.  Fact Sheet for Patients:  bloggercourse.com  Fact Sheet for Healthcare Providers:  seriousbroker.it  This test is no t yet approved or cleared by the [place]  FDA and  has been authorized for detection and/or diagnosis of SARS-CoV-2 by FDA under an Emergency Use Authorization (EUA). This EUA will remain  in effect (meaning this test can be used) for the duration of the COVID-19 declaration under Section 564(b)(1) of the Act, 21 U.S.C.section 360bbb-3(b)(1), unless the authorization is terminated  or revoked sooner.       Influenza A by PCR NEGATIVE NEGATIVE Final   Influenza B by PCR  NEGATIVE NEGATIVE Final    Comment: (NOTE) The Xpert Xpress SARS-CoV-2/FLU/RSV plus assay is intended as an aid in the diagnosis of influenza from Nasopharyngeal swab specimens and should not be used as a sole basis for treatment. Nasal washings and aspirates are unacceptable for Xpert Xpress SARS-CoV-2/FLU/RSV testing.  Fact Sheet for Patients: bloggercourse.com  Fact Sheet for Healthcare Providers: seriousbroker.it  This test is not yet approved or cleared by the [place]  FDA and has been authorized for detection and/or diagnosis of SARS-CoV-2 by FDA under an Emergency Use Authorization (EUA). This EUA will remain in effect (meaning this test can be used) for the duration of the COVID-19 declaration under Section 564(b)(1) of the Act, 21 U.S.C. section 360bbb-3(b)(1), unless the authorization is terminated or revoked.     Resp Syncytial Virus by PCR NEGATIVE NEGATIVE Final    Comment: (NOTE) Fact Sheet for Patients: bloggercourse.com  Fact Sheet for Healthcare Providers: seriousbroker.it  This test is not yet approved or cleared by the [place]  FDA and has been authorized for detection and/or diagnosis of SARS-CoV-2 by FDA under an Emergency Use Authorization (EUA). This EUA will remain in effect (meaning this test can be used) for the duration of the COVID-19 declaration under Section 564(b)(1) of the Act, 21 U.S.C. section 360bbb-3(b)(1), unless the authorization is terminated or revoked.  Performed at [facility], [address], [place], [place] [address]     Labs: CBC: Recent Labs  Lab 03/08/24 1828 03/09/24 0351  WBC 4.9 4.8  NEUTROABS 3.5 3.6  HGB 9.9* 9.1*  HCT 33.4* 31.6*  MCV 94.1 94.6  PLT 152 158   Basic Metabolic Panel: Recent Labs  Lab 03/08/24 1828 03/09/24 0351  NA 140 139  K 3.5 3.4*  CL 101 100  CO2 30 30   GLUCOSE 93 109*  BUN 9 7  CREATININE 0.55 0.50  CALCIUM  9.5 9.1   Liver Function Tests: Recent Labs  Lab 03/08/24 1828 03/09/24 0351  AST 14* 14*  ALT <5 <5  ALKPHOS 84 72  BILITOT 0.5 0.5  PROT 7.4 6.9  ALBUMIN 3.9 3.5   CBG: Recent Labs  Lab 03/09/24 0749 03/09/24 1133  GLUCAP 101* 101*    Discharge time spent: greater than 30 minutes.  Signed: Delon Herald, MD [facility] 03/09/2024

## 2024-03-09 NOTE — Progress Notes (Signed)
 Discharge instructions given to patient questions asked and answered.

## 2024-03-09 NOTE — Assessment & Plan Note (Signed)
-   Continue Eliquis

## 2024-03-09 NOTE — Assessment & Plan Note (Deleted)
 Continue CPAP.

## 2024-03-09 NOTE — H&P (Signed)
 History and Physical    [name] FMW:[id number] DOB: January 26, 1971 DOA: 03/08/2024  Patient coming from: Home.  Chief Complaint: Left lower extremity wound with discharge and bilateral shoulder pain.  HPI: [name] is a 53 y.o. female with c/o chronic HFpEF, hypertension, sleep apnea, diabetes mellitus type 2, chronic wounds of the lower EXTR presents to the ER with complaints of bilateral shoulder pain after moving furniture's.  Patient also has chronic wounds of the left lower extremity which has healed and used to follow-up with wound clinic.  But patient noticed that over the last two weeks the wounds have become more inflamed with some discharge.  Also noticed a wound on the left thigh.  Patient also has been increasing swelling of the lower extremities.  ED Course: X-rays in the ER showed bilateral degenerative changes of the shoulders.  Labs show hemoglobin of 9.9.  Since there was some concern for purulent discharge from the wound cultures were sent and started on empiric antibiotics.  Patient also was complaining of increasing lower EXTR edema was given 1 dose of IV Lasix .  proBNP was 158.  Review of Systems: As per HPI, rest all negative.   Past Medical History:  Diagnosis Date   (HFpEF) heart failure with preserved ejection fraction (HCC) -> although echo suggests normal diastolic parameters with normal left atrial size 04/13/2020   Allergy     Arthritis    Cellulitis    Clotting disorder 2024   COPD (chronic obstructive pulmonary disease) (HCC)    Coronary artery disease, non-occlusive    OBSTRUCTIVE: PT STATES - HAD A CARDIAC CATH - NOT TOLD SHE HAD CAD -> week note from [place]  indicates history of MI (patient cannot corroborate   Diabetes mellitus without complication (HCC)    DVT (deep venous thrombosis) (HCC) 09/17/2017   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   Morbid obesity due to excess calories (HCC)    OSA (obstructive sleep apnea) 07/11/2021   Sleep study April 06, 2019 (Dr. Shellia): Moderate OSA (AHI of 24.4 and SPO2 low of 77%).  Majority events during REM sleep.-recommend CPAP, oral appliance or surgical. Reviewed sleep hygiene.  Avoid sedatives.     Osteoarthritis of left knee    Prediabetes    Sleep apnea    Small bowel obstruction (HCC)    as a child   Speech impediment    Stutter / stammer   Stroke (HCC) 2007    Past Surgical History:  Procedure Laterality Date   ABDOMINAL WALL DEFECT REPAIR  1970   EYE SURGERY     When i was born   IR CV LINE INJECTION  10/24/2020   IR REMOVAL TUN ACCESS W/ PORT W/O FL MOD SED  05/28/2021   IVC FILTER INSERTION  2017   Lower Extremity Venous Duplex  06/23/2020   No evidence of DVT or superficial thrombosis bilaterally.  No evidence of deep venous insufficiency bilaterally.  No evidence of SSV reflux.  Right GSV in the calf has reflux, no reflux in L GSV.;  Repeated in July 2020-no DVT   OOPHORECTOMY  1996   OOPHORECTOMY  1997   PORTACATH PLACEMENT  2014   TRANSTHORACIC ECHOCARDIOGRAM  05/18/2021   EF 60 to 65%.  No RWMA.  Mild concentric LVH.  Normal diastolic parameters .  Normal longitudinal strain.  Normal PAP, RAP.  Normal aortic and mitral valves.==> In July 2022, echo read as GR 1 DD otherwise stable.     reports that she quit smoking about 21 years ago. Her smoking use included cigarettes. She started smoking about 39 years ago. She has a 4.5 pack-year smoking history. She has never used smokeless tobacco. She reports that she does not currently use alcohol. She reports that she does not currently use  drugs.  Allergies  Allergen Reactions   Ace Inhibitors Rash    Make pt bleed   Hydromorphone Hives and Itching   Vancomycin Itching and Rash   Contrast Media [Iodinated Contrast Media] Hives   Dilaudid [Hydromorphone Hcl] Hives   Farxiga  [Dapagliflozin ] Other (See Comments)    Vaginal yeast   Lidocaine  Itching    Itching with Lidocaine  patch reported 06/13/2021 vis telephone message.    Family History  Problem Relation Age of Onset   Breast cancer Mother 18   Diabetes Mellitus II Mother    Diabetes Mother    COPD Father    Diabetes Father    Colon cancer Father    Diabetes Mellitus II Maternal Grandmother    Breast cancer Paternal Grandmother 2   Breast cancer Paternal Grandfather 3   Liver disease Neg Hx    Esophageal cancer Neg Hx     Prior to Admission medications   Medication Sig Start Date End Date Taking? Authorizing Provider  acetaminophen  (TYLENOL ) 500 MG tablet Take 1 tablet (500 mg total) by mouth every 8 (eight) hours as needed. 10/24/23   [name], MD  amLODipine  (NORVASC ) 2.5 MG tablet Take 1 tablet (2.5 mg total) by mouth daily. 10/24/23   [name], MD  apixaban  (ELIQUIS ) 5 MG TABS tablet Take 1 tablet (5 mg total) by mouth 2 (two) times daily. 12/08/23   [name], MD  atorvastatin  (LIPITOR) 40 MG tablet Take 1 tablet (40 mg total) by mouth daily. 08/07/23   [name], MD  cholecalciferol  (VITAMIN D3) 25 MCG (1000 UNIT) tablet Take 1 tablet (1,000 Units total) by mouth daily. 08/07/23   [name], MD  docusate sodium  (COLACE) 100 MG capsule Take 1 capsule (100 mg total) by mouth 2 (two) times daily as needed for mild constipation. 08/07/23   [name], MD  FEROSUL 325 (65 Fe) MG tablet TAKE ONE TABLET BY MOUTH DAILY WITH BREAKFAST 06/06/21   [name], MD  gabapentin  (NEURONTIN ) 400 MG capsule Take 1 capsule (400 mg total) by mouth 3 (three) times daily. 08/07/23   [name], MD  methocarbamol   (ROBAXIN ) 500 MG tablet TAKE 1 TABLET BY MOUTH 2 TIMES A DAY AS NEEDED FOR MUSCLE SPASMS 01/04/24   [name], MD  mometasone -formoterol  (DULERA ) 200-5 MCG/ACT AERO INHALE 2 PUFFS EVERY MORNING AND 2 PUFFS EVERY NIGHT AT BEDTIME 08/07/23   [name], MD  nitroGLYCERIN  (NITROSTAT ) 0.4 MG SL tablet DISSOLVE 1 TABLET UNDER THE TONGUE AS NEEDED FOR CHEST PAIN EVERY 5 MINUTES UP TO 3 TIMES. IF NO RELIEF CALL 911. Patient taking differently: DISSOLVE 1 TABLET UNDER THE TONGUE AS NEEDED FOR CHEST PAIN EVERY 5 MINUTES UP TO 3 TIMES. IF NO RELIEF CALL 911. 03/17/23   [name], MD  PARoxetine  (  PAXIL ) 30 MG tablet Take 1 tablet (30 mg total) by mouth daily. 02/10/24   [name], MD  potassium chloride  SA (KLOR-CON  M) 20 MEQ tablet Take 2 tablets (40 mEq total) by mouth daily. 03/06/23   [name], MD  Semaglutide , 2 MG/DOSE, (OZEMPIC , 2 MG/DOSE,) 8 MG/3ML SOPN DIAL AND INJECT 2 MG UNDER THE SKIN ONCE WEEKLY 10/24/23   [name], MD  spironolactone  (ALDACTONE ) 25 MG tablet Take 1 tablet (25 mg total) by mouth daily. 03/06/23 02/04/24  [name], MD  torsemide  (DEMADEX ) 20 MG tablet Take 2 tablets (40 mg total) by mouth daily. 03/06/23 02/04/24  [name], MD  triamcinolone cream (KENALOG) 0.1 % Apply 1 Application topically daily as needed (skin irritation). 02/08/23   [provider]  vitamin B-12 (CYANOCOBALAMIN ) 1000 MCG tablet Take 1,000 mcg by mouth daily.    [provider]    Physical Exam: Constitutional: Moderately built and nourished. Vitals:   03/08/24 2335 03/08/24 2340 03/09/24 0040 03/09/24 0129  BP: 134/81  (!) 126/91   Pulse:  92 88   Resp:   18   Temp:   (!) 97.5 F (36.4 C)   TempSrc:   Oral   SpO2:  96% 99%   Weight:    118.3 kg  Height:    5' 9 (1.753 m)   Eyes: Anicteric no pallor. ENMT: No discharge from the ears eyes nose or mouth. Neck: No mass felt.  No neck rigidity. Respiratory: No rhonchi  or crepitations. Cardiovascular: S1-S2 heard. Abdomen: Soft nontender bowel sound present. Musculoskeletal: Bilateral lower extremity edema present. Skin: No distal tibia and mid thigh on the left side.  No active discharge at the time of my exam. Neurologic: Alert awake oriented to time place and person.  Moves all extremities. Psychiatric: Appears normal.  Normal affect.   Labs on Admission: I have personally reviewed following labs and imaging studies  CBC: Recent Labs  Lab 03/08/24 1828  WBC 4.9  NEUTROABS 3.5  HGB 9.9*  HCT 33.4*  MCV 94.1  PLT 152   Basic Metabolic Panel: Recent Labs  Lab 03/08/24 1828  NA 140  K 3.5  CL 101  CO2 30  GLUCOSE 93  BUN 9  CREATININE 0.55  CALCIUM  9.5   GFR: Estimated Creatinine Clearance: 109.1 mL/min (by C-G formula based on SCr of 0.55 mg/dL). Liver Function Tests: Recent Labs  Lab 03/08/24 1828  AST 14*  ALT <5  ALKPHOS 84  BILITOT 0.5  PROT 7.4  ALBUMIN 3.9   No results for input(s): LIPASE, AMYLASE in the last 168 hours. No results for input(s): AMMONIA in the last 168 hours. Coagulation Profile: No results for input(s): INR, PROTIME in the last 168 hours. Cardiac Enzymes: No results for input(s): CKTOTAL, CKMB, CKMBINDEX, TROPONINI in the last 168 hours. BNP (last 3 results) Recent Labs    03/08/24 2013  PROBNP 158.0   HbA1C: No results for input(s): HGBA1C in the last 72 hours. CBG: No results for input(s): GLUCAP in the last 168 hours. Lipid Profile: No results for input(s): CHOL, HDL, LDLCALC, TRIG, CHOLHDL, LDLDIRECT in the last 72 hours. Thyroid Function Tests: No results for input(s): TSH, T4TOTAL, FREET4, T3FREE, THYROIDAB in the last 72 hours. Anemia Panel: No results for input(s): VITAMINB12, FOLATE, FERRITIN, TIBC, IRON, RETICCTPCT in the last 72 hours. Urine analysis:    Component Value Date/Time   COLORURINE YELLOW 08/21/2023 1452    APPEARANCEUR TURBID (A) 08/21/2023 1452  APPEARANCEUR Clear 01/03/2022 1531   LABSPEC 1.020 08/21/2023 1452   PHURINE 7.0 08/21/2023 1452   GLUCOSEU NEGATIVE 08/21/2023 1452   HGBUR NEGATIVE 08/21/2023 1452   BILIRUBINUR NEGATIVE 08/21/2023 1452   BILIRUBINUR Negative 01/03/2022 1531   BILIRUBINUR negative 01/03/2022 1510   KETONESUR NEGATIVE 08/21/2023 1452   PROTEINUR NEGATIVE 08/21/2023 1452   UROBILINOGEN 2.0 (A) 01/03/2022 1510   NITRITE NEGATIVE 08/21/2023 1452   LEUKOCYTESUR NEGATIVE 08/21/2023 1452   Sepsis Labs: @LABRCNTIP (procalcitonin:4,lacticidven:4) )No results found for this or any previous visit (from the past 240 hours).   Radiological Exams on Admission: DG Shoulder Left Result Date: 03/08/2024 CLINICAL DATA:  Fall EXAM: LEFT SHOULDER - 2+ VIEW COMPARISON:  None. FINDINGS: There is no acute fracture or dislocation. There are soft tissue calcifications adjacent to the greater tuberosity likely related to calcific tendinitis. There are minimal degenerative changes of the acromioclavicular joint. IMPRESSION: 1. No acute fracture or dislocation. 2. Calcific tendinitis. Electronically Signed   By: Greig Pique M.D.   On: 03/08/2024 18:43   DG Shoulder Right Result Date: 03/08/2024 CLINICAL DATA:  Fall EXAM: RIGHT SHOULDER - 2+ VIEW COMPARISON:  None Available. FINDINGS: There is no acute fracture or dislocation. There are mild-to-moderate degenerative changes of the acromioclavicular joint with joint space narrowing and osteophyte formation. There is a small spur off the superolateral aspect of the acromion. Soft tissues are within normal limits. IMPRESSION: 1. No acute fracture or dislocation. 2. Mild-to-moderate degenerative changes of the acromioclavicular joint. Electronically Signed   By: Greig Pique M.D.   On: 03/08/2024 18:42      Assessment/Plan Principal Problem:   Cellulitis and abscess of left lower extremity Active Problems:   Lymphedema   S/P insertion  of IVC (inferior vena caval) filter   Chronic anemia   Essential hypertension   History of DVT (deep vein thrombosis)   Morbid obesity with BMI of 50.0-59.9, adult (HCC)   OSA (obstructive sleep apnea)   Hyperlipidemia associated with type 2 diabetes mellitus (HCC)   Type 2 diabetes mellitus with morbid obesity (HCC)   Venous insufficiency   (HFpEF) heart failure with preserved ejection fraction (HCC) -> although echo suggests normal diastolic parameters with normal left atrial size   Asthma, chronic, moderate persistent, with acute exacerbation    Cellulitis of the left lower extremity with chronic wound -    patient has 2 wounds on the left lower extremity and one on the distal aspect of the tibia and also one on the mid thigh.  Per patient patient had some purulent discharge.  Was started on empiric antibiotics and cultures obtained.  Will get wound team consult. Bilateral venous insufficiency with chronic edema has noticed increased edema recently.  Was given 1 dose of IV Lasix .  Will check Dopplers.  Continue torsemide  and spironolactone . Chronic HFpEF not short of breath and not requiring any oxygen.  Given increasing peripheral edema will check chest x-ray.  Continue torsemide  and spironolactone . Hypertension on spironolactone  amlodipine  and torsemide . Chronic anemia being followed by oncologist.  Follow CBC.  Continue iron supplements. Diabetes mellitus type 2 on Trulicity .  Last hemoglobin A1c was 5.4 about a year ago.  On sliding scale coverage. History of DVT on Eliquis . Sleep apnea on CPAP. Asthma continue inhalers.  Not wheezing. Hyperlipidemia on statins. Morbid obesity nutrition consult. Depression on Paxil .  Since patient has cellulitis with increasing peripheral edema requiring IV diuretics will need close monitoring further workup and more than 2 midnight stay.   DVT prophylaxis:  Eliquis . Code Status: Full code. Family Communication: Discussed with  patient. Disposition Plan: Medical floor. Consults called: Wound team. Admission status: Observation.

## 2024-03-09 NOTE — Assessment & Plan Note (Deleted)
 Continue paroxetine.

## 2024-03-09 NOTE — Assessment & Plan Note (Deleted)
-   COntinue Eliquis ?

## 2024-03-09 NOTE — Assessment & Plan Note (Signed)
 Patient has 2 wounds on the left lower extremity and one on the distal aspect of the tibia and also one on the mid thigh Per patient patient had some purulent discharge Was started on empiric antibiotics and cultures obtained No evidence of sepsis Will give cefuroxmine Wound care consulted Recommend outpatient wound care follow up Stable for dc

## 2024-03-09 NOTE — Hospital Course (Signed)
 53yo with h/o chronic HFpEF, HTN, OSA, T2DM, and chronic LE wounds who presented on 11/10 with B shoulder pain after moving furniture.  Also with worsening BLE wounds.  Concern for LE cellulitis, wounds cultured and started on antibiotics.  Given Lasix  x1, negative pro-BNP.  Wound care consulted.

## 2024-03-09 NOTE — Assessment & Plan Note (Signed)
 Imaging c/w OA Outpatient f/u with orthopedics

## 2024-03-09 NOTE — Telephone Encounter (Signed)
 Requested Prescriptions  Pending Prescriptions Disp Refills   gabapentin  (NEURONTIN ) 400 MG capsule [Pharmacy Med Name: GABAPENTIN  400 MG CAPSULE] 270 capsule 1    Sig: TAKE 1 CAPSULE BY MOUTH 3 TIMES A DAY     Neurology: Anticonvulsants - gabapentin  Passed - 03/09/2024  1:40 PM      Passed - Cr in normal range and within 360 days    Creatinine  Date Value Ref Range Status  10/16/2023 0.73 0.44 - 1.00 mg/dL Final   Creatinine, Ser  Date Value Ref Range Status  03/09/2024 0.50 0.44 - 1.00 mg/dL Final         Passed - Completed PHQ-2 or PHQ-9 in the last 360 days      Passed - Valid encounter within last 12 months    Recent Outpatient Visits           3 months ago Type 2 diabetes mellitus with morbid obesity (HCC)   [facility] [place]. [facility] [name], [place]   4 months ago Encounter for medication refill   [facility] [place]. [facility] Vicci Barnie B, MD   7 months ago Type 2 diabetes mellitus with morbid obesity [facility])   [facility] [place]. [facility] [name], MD   7 months ago Rib contusion, left, sequela   [facility] - [facility] Dept Of [place]. [facility], Jon M, [place]   10 months ago Yeast infection   [facility] [facility] Dept Of [place]. [facility], Angela M, PA-C               torsemide  (DEMADEX ) 20 MG tablet [Pharmacy Med Name: TORSEMIDE  20 MG TABLET] 180 tablet 1    Sig: TAKE 2 TABLETS BY MOUTH DAILY     Cardiovascular:  Diuretics - Loop Failed - 03/09/2024  1:40 PM      Failed - K in normal range and within 180 days    Potassium  Date Value Ref Range Status  03/09/2024 3.4 (L) 3.5 - 5.1 mmol/L Final         Failed - Mg Level in normal range and within 180 days    Magnesium   Date Value Ref Range Status  01/11/2023 1.8 1.7 - 2.4  mg/dL Final    Comment:    Performed at [facility], [address], [place], [place] [address]         Passed - Ca in normal range and within 180 days    Calcium   Date Value Ref Range Status  03/09/2024 9.1 8.9 - 10.3 mg/dL Final   Calcium , Ion  Date Value Ref Range Status  11/22/2020 1.25 1.15 - 1.40 mmol/L Final         Passed - Na in normal range and within 180 days    Sodium  Date Value Ref Range Status  03/09/2024 139 135 - 145 mmol/L Final  03/13/2023 143 134 - 144 mmol/L Final         Passed - Cr in normal range and within 180 days    Creatinine  Date Value Ref Range Status  10/16/2023 0.73 0.44 - 1.00 mg/dL Final   Creatinine, Ser  Date Value Ref Range Status  03/09/2024 0.50 0.44 - 1.00 mg/dL Final  Passed - Cl in normal range and within 180 days    Chloride  Date Value Ref Range Status  03/09/2024 100 98 - 111 mmol/L Final         Passed - Last BP in normal range    BP Readings from Last 1 Encounters:  03/09/24 105/83         Passed - Valid encounter within last 6 months    Recent Outpatient Visits           3 months ago Type 2 diabetes mellitus with morbid obesity (HCC)   [facility] Of [place]. [facility] [name], MD   4 months ago Encounter for medication refill   [facility] Dept Of [place]. [facility] Vicci Barnie B, MD   7 months ago Type 2 diabetes mellitus with morbid obesity [facility])   [facility] Dept Of [place]. [facility] [name], [place]   7 months ago Rib contusion, left, sequela   [facility] - [facility] Dept Of [place]. [facility], Jon M, [place]   10 months ago Yeast infection   [facility] - [facility] Dept Of [place]. [facility], Jon M, PA-C               amLODipine  (NORVASC ) 2.5 MG tablet [Pharmacy Med Name:  AMLODIPINE  BESYLATE 2.5 MG TAB] 90 tablet 1    Sig: TAKE 1 TABLET BY MOUTH DAILY     Cardiovascular: Calcium  Channel Blockers 2 Passed - 03/09/2024  1:40 PM      Passed - Last BP in normal range    BP Readings from Last 1 Encounters:  03/09/24 105/83         Passed - Last Heart Rate in normal range    Pulse Readings from Last 1 Encounters:  03/09/24 94         Passed - Valid encounter within last 6 months    Recent Outpatient Visits           3 months ago Type 2 diabetes mellitus with morbid obesity (HCC)   [facility] [place]. [facility] [name], [place]   4 months ago Encounter for medication refill   [facility] [place] Dept Of [place]. [facility] Vicci Barnie B, MD   7 months ago Type 2 diabetes mellitus with morbid obesity [facility])   [facility] - [facility] Dept Of [place]. [facility] [name], [place]   7 months ago Rib contusion, left, sequela   [facility] - A Dept Of [place]. [facility], Jon M, [place]   10 months ago Yeast infection   [facility] [place] [facility] [place] - [facility] Dept Of [place]. [facility], Angela M, PA-C               melatonin 3 MG TABS tablet [Pharmacy Med Name: MELATONIN 3 MG TABLET] 30 tablet     Sig: TAKE 1 TABLET BY MOUTH EVERY NIGHT AT BEDTIME     Over the Counter:  OTC Passed - 03/09/2024  1:40 PM      Passed - Valid encounter within last 12 months    Recent Outpatient  Visits           3 months ago Type 2 diabetes mellitus with morbid obesity (HCC)   [facility] Dept Of [place]. [facility] [name], [place]   4 months ago Encounter for medication refill   [facility] Of [place]. [facility] Vicci Barnie B, MD   7 months ago Type 2 diabetes mellitus with morbid obesity [facility])   [place] [facility] - [facility] Dept Of [place]. [facility] [name], [place]   7 months ago Rib contusion, left, sequela   [facility] - A Dept Of [place]. [facility], Jon M, [place]   10 months ago Yeast infection   [place] [facility] [place] - [facility] Dept Of [place]. [facility], [place], PA-C

## 2024-03-09 NOTE — Assessment & Plan Note (Signed)
 Continue amlodipine  but consider dc as an outpatient - could be contributing to edema Continue torsemide  and spironolactone 

## 2024-03-09 NOTE — Assessment & Plan Note (Deleted)
 followed by oncologist. Follow CBC. Continue iron supplements.

## 2024-03-09 NOTE — Assessment & Plan Note (Deleted)
 Continue inhalers Appears compensated

## 2024-03-09 NOTE — Assessment & Plan Note (Deleted)
 Compensated despite LE edema (lymphedema) Given IV Lasix  x 1 Continue torsemide  and spironolactone 

## 2024-03-09 NOTE — Plan of Care (Signed)

## 2024-03-09 NOTE — Assessment & Plan Note (Signed)
 Continue paroxetine.

## 2024-03-09 NOTE — Assessment & Plan Note (Signed)
 Continue CPAP.

## 2024-03-09 NOTE — Assessment & Plan Note (Signed)
 Compensated despite LE edema (lymphedema) Given IV Lasix  x 1 Continue torsemide  and spironolactone 

## 2024-03-09 NOTE — Telephone Encounter (Signed)
 Requested medications are due for refill today.  unsure  Requested medications are on the active medications list.  yes  Last refill. Melatonin 01/14/2024, Torsemide  03/06/2023 #180 1 rf  Future visit scheduled.   yes  Notes to clinic.  Melatonin is historical. Rx for Torsemide  was written to expire 03/09/2024 - rx is expired.    Requested Prescriptions  Pending Prescriptions Disp Refills   torsemide  (DEMADEX ) 20 MG tablet [Pharmacy Med Name: TORSEMIDE  20 MG TABLET] 180 tablet 1    Sig: TAKE 2 TABLETS BY MOUTH DAILY     Cardiovascular:  Diuretics - Loop Failed - 03/09/2024  1:41 PM      Failed - K in normal range and within 180 days    Potassium  Date Value Ref Range Status  03/09/2024 3.4 (L) 3.5 - 5.1 mmol/L Final         Failed - Mg Level in normal range and within 180 days    Magnesium   Date Value Ref Range Status  01/11/2023 1.8 1.7 - 2.4 mg/dL Final    Comment:    Performed at [facility], [address] [place], [place] [address]         Passed - Ca in normal range and within 180 days    Calcium   Date Value Ref Range Status  03/09/2024 9.1 8.9 - 10.3 mg/dL Final   Calcium , Ion  Date Value Ref Range Status  11/22/2020 1.25 1.15 - 1.40 mmol/L Final         Passed - Na in normal range and within 180 days    Sodium  Date Value Ref Range Status  03/09/2024 139 135 - 145 mmol/L Final  03/13/2023 143 134 - 144 mmol/L Final         Passed - Cr in normal range and within 180 days    Creatinine  Date Value Ref Range Status  10/16/2023 0.73 0.44 - 1.00 mg/dL Final   Creatinine, Ser  Date Value Ref Range Status  03/09/2024 0.50 0.44 - 1.00 mg/dL Final         Passed - Cl in normal range and within 180 days    Chloride  Date Value Ref Range Status  03/09/2024 100 98 - 111 mmol/L Final         Passed - Last BP in normal range    BP Readings from Last 1 Encounters:  03/09/24 105/83         Passed - Valid encounter within last 6 months    Recent  Outpatient Visits           3 months ago Type 2 diabetes mellitus with morbid obesity (HCC)   [facility] - [facility] Dept Of [place]. [facility] [name], [place]   4 months ago Encounter for medication refill   [facility] [facility] - A Dept Of [place]. [facility] Vicci Barnie B, MD   7 months ago Type 2 diabetes mellitus with morbid obesity [facility])   [facility] - [facility] Dept Of [place]. [facility] [name], MD   7 months ago Rib contusion, left, sequela   [facility] - A Dept Of . [facility], Jon M, [place]   10 months ago Yeast infection   [facility] [facility] - A Dept Of . [facility] Big[place], [place], PA-C  melatonin 3 MG TABS tablet [Pharmacy Med Name: MELATONIN 3 MG TABLET] 30 tablet     Sig: TAKE 1 TABLET BY MOUTH EVERY NIGHT AT BEDTIME     Over the Counter:  OTC Passed - 03/09/2024  1:41 PM      Passed - Valid encounter within last 12 months    Recent Outpatient Visits           3 months ago Type 2 diabetes mellitus with morbid obesity (HCC)   [facility] - A Dept Of [place]. [facility] [name], MD   4 months ago Encounter for medication refill   [place] [facility] [place] - A Dept Of [place]. [facility] Vicci Barnie B, MD   7 months ago Type 2 diabetes mellitus with morbid obesity [facility])   [place] [facility] - A Dept Of [place]. [facility] [name], MD   7 months ago Rib contusion, left, sequela   [place] [facility] - A Dept Of [place]. [facility], Jon M, [place]   10 months ago Yeast infection   [facility] [facility] - A Dept Of [place]. [facility], [place], [place]              Signed Prescriptions Disp Refills   gabapentin   (NEURONTIN ) 400 MG capsule 270 capsule 1    Sig: TAKE 1 CAPSULE BY MOUTH 3 TIMES A DAY     Neurology: Anticonvulsants - gabapentin  Passed - 03/09/2024  1:41 PM      Passed - Cr in normal range and within 360 days    Creatinine  Date Value Ref Range Status  10/16/2023 0.73 0.44 - 1.00 mg/dL Final   Creatinine, Ser  Date Value Ref Range Status  03/09/2024 0.50 0.44 - 1.00 mg/dL Final         Passed - Completed PHQ-2 or PHQ-9 in the last 360 days      Passed - Valid encounter within last 12 months    Recent Outpatient Visits           3 months ago Type 2 diabetes mellitus with morbid obesity (HCC)   [facility] [facility] - A Dept Of [place]. [facility] [name], MD   4 months ago Encounter for medication refill   [place] [facility] [place] - A Dept Of [place]. [facility] Vicci Barnie B, MD   7 months ago Type 2 diabetes mellitus with morbid obesity [facility])   [place] [facility] - A Dept Of [place]. [facility] [name], MD   7 months ago Rib contusion, left, sequela   [place] [facility] [facility] - A Dept Of [place]. [facility], Jon M, [place]   10 months ago Yeast infection    [facility] [place] - A Dept Of [place]. [facility], Jon M, PA-C               amLODipine  (NORVASC ) 2.5 MG tablet 90 tablet 0    Sig: TAKE 1 TABLET BY MOUTH DAILY     Cardiovascular: Calcium  Channel Blockers 2 Passed - 03/09/2024  1:41 PM      Passed - Last BP in normal range    BP Readings from Last 1 Encounters:  03/09/24 105/83  Passed - Last Heart Rate in normal range    Pulse Readings from Last 1 Encounters:  03/09/24 94         Passed - Valid encounter within last 6 months    Recent Outpatient Visits           3 months ago Type 2 diabetes mellitus with morbid obesity (HCC)   [facility] [facility] - A Dept Of [place]. [facility] [name], MD   4 months ago Encounter for medication refill   [place] [facility] [place] - A Dept Of [place]. [facility] Vicci Barnie B, MD   7 months ago Type 2 diabetes mellitus with morbid obesity [facility])   [facility] [facility] - A Dept Of [place]. [facility] [name], MD   7 months ago Rib contusion, left, sequela   [place] [facility] - A Dept Of [place]. [facility], Jon M, [place]   10 months ago Yeast infection   [place] [facility] [place] - A Dept Of [place]. [facility] [place], [place], PA-C

## 2024-03-09 NOTE — Telephone Encounter (Signed)
 Requested Prescriptions  Pending Prescriptions Disp Refills   torsemide  (DEMADEX ) 20 MG tablet [Pharmacy Med Name: TORSEMIDE  20 MG TABLET] 180 tablet 1    Sig: TAKE 2 TABLETS BY MOUTH DAILY     Cardiovascular:  Diuretics - Loop Failed - 03/09/2024  1:40 PM      Failed - K in normal range and within 180 days    Potassium  Date Value Ref Range Status  03/09/2024 3.4 (L) 3.5 - 5.1 mmol/L Final         Failed - Mg Level in normal range and within 180 days    Magnesium   Date Value Ref Range Status  01/11/2023 1.8 1.7 - 2.4 mg/dL Final    Comment:    Performed at [facility], [address] [place], [place] [address]         Passed - Ca in normal range and within 180 days    Calcium   Date Value Ref Range Status  03/09/2024 9.1 8.9 - 10.3 mg/dL Final   Calcium , Ion  Date Value Ref Range Status  11/22/2020 1.25 1.15 - 1.40 mmol/L Final         Passed - Na in normal range and within 180 days    Sodium  Date Value Ref Range Status  03/09/2024 139 135 - 145 mmol/L Final  03/13/2023 143 134 - 144 mmol/L Final         Passed - Cr in normal range and within 180 days    Creatinine  Date Value Ref Range Status  10/16/2023 0.73 0.44 - 1.00 mg/dL Final   Creatinine, Ser  Date Value Ref Range Status  03/09/2024 0.50 0.44 - 1.00 mg/dL Final         Passed - Cl in normal range and within 180 days    Chloride  Date Value Ref Range Status  03/09/2024 100 98 - 111 mmol/L Final         Passed - Last BP in normal range    BP Readings from Last 1 Encounters:  03/09/24 105/83         Passed - Valid encounter within last 6 months    Recent Outpatient Visits           3 months ago Type 2 diabetes mellitus with morbid obesity (HCC)   [facility] [facility] - A Dept Of [place]. [facility] [name], MD   4 months ago Encounter for medication refill   [place] [facility] [place] - A Dept Of [place]. [facility] Vicci Barnie B,  MD   7 months ago Type 2 diabetes mellitus with morbid obesity [facility])   [place] [facility] - A Dept Of [place]. [facility] [name], MD   7 months ago Rib contusion, left, sequela   [facility] - A Dept Of [place]. [facility], Jon M, [place]   10 months ago Yeast infection   [place] [facility] [place] - A Dept Of [place]. [facility], Jon M, PA-C               amLODipine  (NORVASC ) 2.5 MG tablet [Pharmacy Med Name: AMLODIPINE  BESYLATE 2.5 MG TAB] 90 tablet 0    Sig: TAKE 1 TABLET BY MOUTH DAILY     Cardiovascular: Calcium  Channel Blockers 2 Passed - 03/09/2024  1:40 PM      Passed - Last  BP in normal range    BP Readings from Last 1 Encounters:  03/09/24 105/83         Passed - Last Heart Rate in normal range    Pulse Readings from Last 1 Encounters:  03/09/24 94         Passed - Valid encounter within last 6 months    Recent Outpatient Visits           3 months ago Type 2 diabetes mellitus with morbid obesity (HCC)   [place] [facility] - A Dept Of [place]. [facility] [name], MD   4 months ago Encounter for medication refill   [place] [facility] - A Dept Of [place]. [facility] Vicci Barnie B, MD   7 months ago Type 2 diabetes mellitus with morbid obesity [facility])   [place] [facility] - A Dept Of [place]. [facility] [name], MD   7 months ago Rib contusion, left, sequela   [facility] [facility] - A Dept Of [place]. [facility], Jon M, [place]   10 months ago Yeast infection   [place] [facility] [place] - A Dept Of [place]. [facility] [place], Angela M, PA-C               melatonin 3 MG TABS tablet [Pharmacy Med Name: MELATONIN 3 MG TABLET] 30 tablet     Sig: TAKE 1 TABLET BY MOUTH EVERY NIGHT AT BEDTIME     Over the Counter:   OTC Passed - 03/09/2024  1:40 PM      Passed - Valid encounter within last 12 months    Recent Outpatient Visits           3 months ago Type 2 diabetes mellitus with morbid obesity (HCC)   [place] [facility] - A Dept Of [place]. [facility] [name], MD   4 months ago Encounter for medication refill   [place] [facility] [place] - A Dept Of [place]. [facility] Vicci Barnie B, MD   7 months ago Type 2 diabetes mellitus with morbid obesity [facility])   [place] [facility] - A Dept Of [place]. [facility] [name], [place]   7 months ago Rib contusion, left, sequela   [place] [facility] - A Dept Of [place]. [facility], Jon M, [place]   10 months ago Yeast infection    [facility] [place] - A Dept Of . [facility] [place], [place], PA-C              Signed Prescriptions Disp Refills   gabapentin  (NEURONTIN ) 400 MG capsule 270 capsule 1    Sig: TAKE 1 CAPSULE BY MOUTH 3 TIMES A DAY     Neurology: Anticonvulsants - gabapentin  Passed - 03/09/2024  1:40 PM      Passed - Cr in normal range and within 360 days    Creatinine  Date Value Ref Range Status  10/16/2023 0.73 0.44 - 1.00 mg/dL Final   Creatinine, Ser  Date Value Ref Range Status  03/09/2024 0.50 0.44 - 1.00 mg/dL Final         Passed - Completed PHQ-2 or PHQ-9 in the last 360 days      Passed - Valid encounter within last 12 months  Recent Outpatient Visits           3 months ago Type 2 diabetes mellitus with morbid obesity (HCC)   [place] [facility] - A Dept Of [place]. [facility] [name], MD   4 months ago Encounter for medication refill   [place] [facility] [place] - A Dept Of [place]. [facility] Vicci Barnie B, MD   7 months ago Type 2 diabetes mellitus with morbid obesity [facility])   [place] [facility] - A  Dept Of [place]. [facility] [name], [place]   7 months ago Rib contusion, left, sequela   [place] [facility] - A Dept Of [place]. [facility], Jon M, [place]   10 months ago Yeast infection   [place] [facility] [place] - A Dept Of [place]. [facility] [place], [place], PA-C

## 2024-03-09 NOTE — Progress Notes (Signed)
 Venous duplex lower ext  has been completed. Refer to [facility] under chart review to view preliminary results.   03/09/2024  2:32 PM [name], [name]  ]

## 2024-03-09 NOTE — Assessment & Plan Note (Signed)
 A1c 5, excellent control Resume Trulicity , consider trial off medication Carb modified diet

## 2024-03-09 NOTE — Evaluation (Signed)
 Physical Therapy Evaluation Patient Details Name: [name] MRN: [id number] DOB: [birth year] Today's Date: 03/09/2024  History of Present Illness  [name] is a 53 y.o. female  of the LE presents to the ER and now obs status at [facility] with complaints of B shoulder pain after moving furniture's.  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 All education has been completed and the patient has no further questions.  Pt reports she is independent at her baseline, typically ambulates household distances without device. Has RW nd cane, lives with her mother.  Pt is able to ambulate short distances with Rw and supervision for safety and IV management only; pt has been able to mobilize to bathroom with nursing staff as well per their report.  No further PT needs at this time; if pt remains in acute care will benefit from being seen by mobility team.  Pt would like a new RW if possible; pt advised to use RW prn as she states her legs give way, denies falls.  See below for any follow-up Physical Therapy or equipment needs. PT is signing off. Thank you for this referral.         If plan is discharge home, recommend the following: Help with stairs or ramp for entrance;Assist for transportation   Can travel by private vehicle        Equipment Recommendations Rolling walker (2 wheels)  Recommendations for Other Services       Functional Status Assessment Patient has not had a recent decline in their functional status     Precautions / Restrictions Precautions Precautions: Fall Restrictions Weight Bearing Restrictions Per Provider Order: No      Mobility  Bed Mobility Overal bed mobility: Needs  Assistance Bed Mobility: Supine to Sit     Supine to sit: Modified independent (Device/Increase time)     General bed mobility comments: incr time, no physical assist    Transfers Overall transfer level: Needs assistance Equipment used: Rolling walker (2 wheels), None Transfers: Sit to/from Stand Sit to Stand: Supervision                Ambulation/Gait Ambulation/Gait assistance: Supervision, Modified independent (Device/Increase time) Gait Distance (Feet): 16 Feet (x2) Assistive device: Rolling walker (2 wheels) Gait Pattern/deviations: Step-through pattern, Decreased stride length, Wide base of support Gait velocity: decr     General Gait Details: cues for RW position, supervision for safety and line magangement. wide BOS d/tbody habitus.  pt with brief episode of instability while standing at sink, self recovered without overt LOB. seated rest between distances.  Stairs            Wheelchair Mobility     Tilt Bed    Modified Rankin (Stroke Patients Only)       Balance Overall balance assessment: Needs assistance Sitting-balance support: Feet supported, No upper extremity supported Sitting balance-Leahy Scale: Good     Standing balance support: Reliant on assistive device for balance, During functional activity, No upper extremity supported Standing balance-Leahy Scale: Fair Standing balance comment: pt is able to perform pericare without UE support, stand at sink, wash hands; RW used for safety--light use for gait             High level balance  activites: Side stepping, Turns High Level Balance Comments: use of RW             Pertinent Vitals/Pain Pain Assessment Pain Assessment: Faces Faces Pain Scale: Hurts little more Pain Location: LEs Pain Intervention(s): Monitored during session, Repositioned    Home Living Family/patient expects to be discharged to:: Private residence Living Arrangements: Parent Available Help at Discharge:  Family;Available 24 hours/day Type of Home: Apartment Home Access: Level entry       Home Layout: One level Home Equipment: Agricultural Consultant (2 wheels);Cane - single point;Hand held shower head;Other (comment) Additional Comments: adjustable bed    Prior Function               Mobility Comments: independent household ambulator; does not drive; denies falls ADLs Comments: independent with ADLs     Extremity/Trunk Assessment   Upper Extremity Assessment Upper Extremity Assessment: Defer to OT evaluation    Lower Extremity Assessment Lower Extremity Assessment:  (AROM limitations d/t body habitus)       Communication   Communication Communication: No apparent difficulties    Cognition Arousal: Alert Behavior During Therapy: WFL for tasks assessed/performed   PT - Cognitive impairments: No apparent impairments                         Following commands: Intact       Cueing Cueing Techniques: Verbal cues     General Comments      Exercises     Assessment/Plan    PT Assessment Patient does not need any further PT services  PT Problem List         PT Treatment Interventions      PT Goals (Current goals can be found in the Care Plan section)  Acute Rehab PT Goals PT Goal Formulation: All assessment and education complete, DC therapy    Frequency       Co-evaluation PT/OT/SLP Co-Evaluation/Treatment: Yes Reason for Co-Treatment: To address functional/ADL transfers PT goals addressed during session: Mobility/safety with mobility OT goals addressed during session: ADL's and self-care       AM-PAC PT 6 Clicks Mobility  Outcome Measure Help needed turning from your back to your side while in a flat bed without using bedrails?: None Help needed moving from lying on your back to sitting on the side of a flat bed without using bedrails?: None Help needed moving to and from a bed to a chair (including a wheelchair)?: None Help needed  standing up from a chair using your arms (e.g., wheelchair or bedside chair)?: None   Help needed climbing 3-5 steps with a railing? : A Little 6 Click Score: 19    End of Session Equipment Utilized During Treatment: Gait belt Activity Tolerance: Patient tolerated treatment well Patient left: with call bell/phone within reach;in chair;with chair alarm set Nurse Communication: Mobility status PT Visit Diagnosis: Other abnormalities of gait and mobility (R26.89)    Time: 8845-8783 PT Time Calculation (min) (ACUTE ONLY): 22 min   Charges:   PT Evaluation $PT Eval Low Complexity: 1 Low   PT General Charges $$ ACUTE PT VISIT: 1 Visit         Travez Stancil, PT  Acute Rehab Dept ([facility]) [phone]  03/09/2024   [facility] 03/09/2024, 1:32 PM

## 2024-03-09 NOTE — Assessment & Plan Note (Deleted)
 Continue statin

## 2024-03-09 NOTE — Assessment & Plan Note (Deleted)
 A1c 5, excellent control Hold Trulicity  Cover with moderate-scale SSI Carb modified diet

## 2024-03-09 NOTE — Assessment & Plan Note (Signed)
 Continue statin

## 2024-03-10 ENCOUNTER — Other Ambulatory Visit (HOSPITAL_COMMUNITY): Payer: Self-pay | Admitting: Physician Assistant

## 2024-03-10 ENCOUNTER — Telehealth: Payer: Self-pay

## 2024-03-10 ENCOUNTER — Other Ambulatory Visit: Payer: Self-pay | Admitting: Internal Medicine

## 2024-03-10 DIAGNOSIS — E1159 Type 2 diabetes mellitus with other circulatory complications: Secondary | ICD-10-CM

## 2024-03-10 MED ORDER — POTASSIUM CHLORIDE ER 20 MEQ PO TBCR: 20.0000 meq | EXTENDED_RELEASE_TABLET | Freq: Every day | ORAL | 1 refills | Status: AC | PRN

## 2024-03-10 NOTE — Transitions of Care (Post Inpatient/ED Visit) (Signed)
 03/10/2024  Name: [name] MRN: [id number] DOB: 03/17/1971  Today's TOC FU Call Status: Today's TOC FU Call Status:: Successful TOC FU Call Completed TOC FU Call Complete Date: 03/10/24  Patient's Name and Date of Birth confirmed. DOB, Name  Transition Care Management Follow-up Telephone Call Date of Discharge: 03/09/24 Discharge Facility: [facility]) Type of Discharge: Inpatient Admission Primary Inpatient Discharge Diagnosis:: celulitis and abscess of LLE How have you been since you were released from the hospital?: Same (She said her left leg is still hurting a little bit.) Any questions or concerns?: No  Items Reviewed: Did you receive and understand the discharge instructions provided?: Yes Medications obtained,verified, and reconciled?: Yes (Medications Reviewed) (She needs a refill of melatonin. The AVS indicates to stop potassium chloride  SA and she also has potassium chloride  ER listed on her AVS to take prn (cramping).  She said she was told to stop taking potassium. I will notify PCS) Any new allergies since your discharge?: No Dietary orders reviewed?: Yes Type of Diet Ordered:: heart healthy, carb modified Do you have support at home?: Yes People in Home [RPT]: parent(s) Name of Support/Comfort Primary Source: her mother who is a nurse  Medications Reviewed Today: Medications Reviewed Today     Reviewed by Marvis Bradley, RN (Case Manager) on 03/10/24 at 1607  Med List Status: <None>   Medication Order Taking? Sig Documenting Provider Last Dose Status Informant  acetaminophen  (TYLENOL ) 325 MG tablet [id number]  Take 2 tablets (650 mg total) by mouth every 6 (six) hours as needed for mild pain (pain score 1-3) or fever (or Fever >/= 101). Barbarann Nest, MD  Active   amLODipine  (NORVASC ) 2.5 MG tablet [id number]  Take 1 tablet (2.5 mg total) by mouth daily. Barbarann Nest, MD  Active   apixaban  (ELIQUIS ) 5 MG TABS tablet [id number]  Take 1 tablet (5  mg total) by mouth 2 (two) times daily. Louder Barnie NOVAK, MD  Active Self, Pharmacy Records  atorvastatin  (LIPITOR) 40 MG tablet [id number]  Take 1 tablet (40 mg total) by mouth daily. Louder Barnie NOVAK, MD  Active Self, Pharmacy Records  cefadroxil (DURICEF) 500 MG capsule [id number]  Take 2 capsules (1,000 mg total) by mouth 2 (two) times daily for 7 days. Barbarann Nest, MD  Active   cholecalciferol  (VITAMIN D3) 25 MCG (1000 UNIT) tablet [id number]  Take 1 tablet (1,000 Units total) by mouth daily. Louder Barnie NOVAK, MD  Active Self, Pharmacy Records  docusate sodium  (COLACE) 100 MG capsule [id number]  Take 1 capsule (100 mg total) by mouth 2 (two) times daily as needed for mild constipation.  Patient taking differently: Take 100 mg by mouth daily.   Louder Barnie NOVAK, MD  Active Self, Pharmacy Records  FEROSUL 325 [phone]) MG tablet [id number]  TAKE ONE TABLET BY MOUTH DAILY WITH BREAKFAST  Patient taking differently: Take 325 mg by mouth daily.   Louder Barnie NOVAK, MD  Active Self, Pharmacy Records  gabapentin  (NEURONTIN ) 400 MG capsule [id number]  Take 1 capsule (400 mg total) by mouth 3 (three) times daily. Barbarann Nest, MD  Active   melatonin 3 MG TABS tablet [id number]  Take 3 mg by mouth at bedtime. [provider]  Active Self, Pharmacy Records  methocarbamol  (ROBAXIN ) 500 MG tablet [id number]  TAKE 1 TABLET BY MOUTH 2 TIMES A DAY AS NEEDED FOR MUSCLE SPASMS  Patient taking differently: Take 500 mg by mouth daily as needed for muscle spasms.   Louder Barnie NOVAK, MD  Active Self, Pharmacy Records  mometasone -formoterol  (DULERA ) 200-5 MCG/ACT AERO [id number]  INHALE 2 PUFFS EVERY MORNING AND 2 PUFFS EVERY NIGHT AT BEDTIME  Patient taking differently: Inhale 2 puffs into the lungs daily as needed for wheezing or shortness of breath.   [name], MD  Active Self, Pharmacy Records  nitroGLYCERIN  (NITROSTAT ) 0.4 MG SL tablet [id number]  DISSOLVE 1 TABLET UNDER THE TONGUE AS  NEEDED FOR CHEST PAIN EVERY 5 MINUTES UP TO 3 TIMES. IF NO RELIEF CALL 911.  Patient taking differently: Place 0.4 mg under the tongue every 5 (five) minutes as needed for chest pain.   [name], MD  Active Pharmacy Records, Self           Med Note (CRUTHIS, CHLOE C   Tue Mar 09, 2024  7:20 AM)    PARoxetine  (PAXIL ) 30 MG tablet [id number]  Take 1 tablet (30 mg total) by mouth daily. [name], MD  Active Self, Pharmacy Records  Potassium Chloride  ER 20 MEQ TBCR [id number]  Take 1 tablet (20 mEq total) by mouth daily as needed (cramping).  Patient not taking: Reported on 03/10/2024   [name], MD  Active   Semaglutide , 2 MG/DOSE, (OZEMPIC , 2 MG/DOSE,) 8 MG/3ML SOPN [id number]  DIAL AND INJECT 2 MG UNDER THE SKIN ONCE WEEKLY  Patient taking differently: Inject 2 mg into the skin once a week.   [name], MD  Active Self, Pharmacy Records  spironolactone  (ALDACTONE ) 25 MG tablet [id number]  Take 1 tablet (25 mg total) by mouth daily. [name], MD  Expired 03/09/24 2359 Self, Pharmacy Records           Med Note (CRUTHIS, CHLOE C   Tue Mar 09, 2024  9:27 AM) No fill hx found. Pt is adamant he takes this medication.  torsemide  (DEMADEX ) 20 MG tablet [id number]  Take 2 tablets (40 mg total) by mouth daily.  Patient taking differently: Take 20 mg by mouth 2 (two) times daily.   [name], MD  Expired 03/09/24 2359 Pharmacy Records, Self           Med Note (CRUTHIS, CHLOE C   Tue Mar 09, 2024  7:20 AM)    triamcinolone cream (KENALOG) 0.1 % [id number]  Apply 1 Application topically daily as needed (skin irritation). [provider]  Active Pharmacy Records, Self  vitamin B-12 (CYANOCOBALAMIN ) 1000 MCG tablet [id number]  Take 1,000 mcg by mouth daily. [provider]  Active Self, Pharmacy Records            Home Care and Equipment/Supplies: Were Home Health Services Ordered?: No Any new equipment or medical supplies ordered?:  Yes Name of Medical supply agency?: [facility]- she received it in the hospital.  She also stated she was given wound care supplies. Were you able to get the equipment/medical supplies?: Yes Do you have any questions related to the use of the equipment/supplies?: No  Functional Questionnaire: Do you need assistance with bathing/showering or dressing?: No (she has dressings on her LLE and stated she was instructed not to change them until wound care sees her 03/11/2024. If the dressings need to be changed between wound care appts. she said her mother can change them, she is a engineer, civil (consulting).) Do you need assistance with meal preparation?: No Do you need assistance with eating?: No Do you have difficulty maintaining continence: No Do you need assistance with getting out of bed/getting out of a chair/moving?: Yes (ambulates with [facility])  Do you have difficulty managing or taking your medications?: No (She has a glucometer and said she checks her blood sugars 3x/day.)  Follow up appointments reviewed: PCP Follow-up appointment confirmed?: Yes Date of PCP follow-up appointment?: 04/08/24 Follow-up Provider: Dr Vicci.  I offered to schedule her with another provider to be seen before 12/11 but she declined and prefers to wait to see Dr [facility] Follow-up appointment confirmed?: Yes Date of Specialist follow-up appointment?: 03/11/24 Follow-Up Specialty Provider:: wound care center.   She has the number for [facility] to call and follow up for her shoulder pain Do you need transportation to your follow-up appointment?: No Do you understand care options if your condition(s) worsen?: Yes-patient verbalized understanding    SIGNATURE Slater Diesel, RN

## 2024-03-10 NOTE — Telephone Encounter (Signed)
 Dr VicciGLENWOOD LIPS  She said her LLE is still hurting, the dressing is intact and she sees wound care tomorrow, 03/11/2024.   We reviewed her medications and she needs a refill of melatonin.   Her AVS indicates to stop potassium chloride  SA. She has potassium chloride  ER listed on her AVS to take prn (cramping). She said she was told to stop taking potassium. Please clarify.   Thanks

## 2024-03-10 NOTE — Telephone Encounter (Signed)
 Had just completed a refill request from her pharmacy and I sent it in as it was written on her discharge summary to use PRN. However, if they told her to stop it, then I recommend that she hold off on taking it. Do not fill prescription. I will remove it from her med list. Come to lab in 1 wk for recheck potassium and kidney function. Thanks.

## 2024-03-11 ENCOUNTER — Encounter (HOSPITAL_BASED_OUTPATIENT_CLINIC_OR_DEPARTMENT_OTHER): Attending: General Surgery | Admitting: General Surgery

## 2024-03-11 DIAGNOSIS — I890 Lymphedema, not elsewhere classified: Secondary | ICD-10-CM | POA: Insufficient documentation

## 2024-03-11 DIAGNOSIS — I872 Venous insufficiency (chronic) (peripheral): Secondary | ICD-10-CM | POA: Diagnosis not present

## 2024-03-11 DIAGNOSIS — L97822 Non-pressure chronic ulcer of other part of left lower leg with fat layer exposed: Secondary | ICD-10-CM | POA: Insufficient documentation

## 2024-03-11 DIAGNOSIS — L02416 Cutaneous abscess of left lower limb: Secondary | ICD-10-CM | POA: Diagnosis not present

## 2024-03-11 DIAGNOSIS — E11622 Type 2 diabetes mellitus with other skin ulcer: Secondary | ICD-10-CM | POA: Insufficient documentation

## 2024-03-11 DIAGNOSIS — L97122 Non-pressure chronic ulcer of left thigh with fat layer exposed: Secondary | ICD-10-CM | POA: Diagnosis not present

## 2024-03-11 DIAGNOSIS — I5030 Unspecified diastolic (congestive) heart failure: Secondary | ICD-10-CM | POA: Insufficient documentation

## 2024-03-12 ENCOUNTER — Other Ambulatory Visit: Payer: Self-pay | Admitting: Internal Medicine

## 2024-03-12 DIAGNOSIS — E1169 Type 2 diabetes mellitus with other specified complication: Secondary | ICD-10-CM

## 2024-03-12 DIAGNOSIS — S298XXS Other specified injuries of thorax, sequela: Secondary | ICD-10-CM

## 2024-03-14 LAB — CULTURE, BLOOD (ROUTINE X 2)
Culture: NO GROWTH
Culture: NO GROWTH
Special Requests: ADEQUATE

## 2024-03-19 ENCOUNTER — Telehealth: Admitting: *Deleted

## 2024-03-22 ENCOUNTER — Encounter (HOSPITAL_BASED_OUTPATIENT_CLINIC_OR_DEPARTMENT_OTHER): Admitting: General Surgery

## 2024-03-22 DIAGNOSIS — L97822 Non-pressure chronic ulcer of other part of left lower leg with fat layer exposed: Secondary | ICD-10-CM | POA: Diagnosis not present

## 2024-03-22 DIAGNOSIS — I872 Venous insufficiency (chronic) (peripheral): Secondary | ICD-10-CM | POA: Diagnosis not present

## 2024-03-24 ENCOUNTER — Other Ambulatory Visit: Payer: Self-pay

## 2024-03-24 ENCOUNTER — Other Ambulatory Visit: Payer: Self-pay | Admitting: *Deleted

## 2024-03-24 NOTE — Patient Outreach (Signed)
 Complex Care Management   Visit Note  03/24/2024  Name:  [name] MRN: [id number] DOB: Nov 04, 1970  Situation: Referral received for Complex Care Management related to Heart Failure and HTN I obtained verbal consent from Patient.  Visit completed with Patient  on the phone  Background:   Past Medical History:  Diagnosis Date   (HFpEF) heart failure with preserved ejection fraction (HCC) -> although echo suggests normal diastolic parameters with normal left atrial size 04/13/2020   Allergy     Arthritis    Cellulitis    Clotting disorder 2024   COPD (chronic obstructive pulmonary disease) (HCC)    Coronary artery disease, non-occlusive    OBSTRUCTIVE: PT STATES - HAD A CARDIAC CATH - NOT TOLD SHE HAD CAD -> week note from [place]  indicates history of MI (patient cannot corroborate   Diabetes mellitus without complication (HCC)    DVT (deep venous thrombosis) (HCC) 09/17/2017   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   Morbid obesity due to excess calories (HCC)    OSA (obstructive sleep apnea) 07/11/2021   Sleep study April 06, 2019 (Dr. Shellia): Moderate OSA (AHI of 24.4 and SPO2 low of 77%).  Majority events during REM sleep.-recommend CPAP, oral appliance or surgical. Reviewed sleep hygiene.  Avoid sedatives.     Osteoarthritis of left knee    Prediabetes    Sleep apnea    Small bowel obstruction (HCC)    as a child   Speech impediment    Stutter / stammer   Stroke (HCC) 2007    Assessment: Patient Reported Symptoms:  Cognitive Cognitive Status: Able to follow simple commands, Alert and oriented to person,  place, and time, Insightful and able to interpret abstract concepts, Normal speech and language skills Cognitive/Intellectual Conditions Management [RPT]: None reported or documented in medical history or problem list   Health Maintenance Behaviors: Annual physical exam, Sleep adequate, Healthy diet, Stress management Healing Pattern: Average Health Facilitated by: Healthy diet, Pain control, Prayer/meditation, Rest, Stress management  Neurological Neurological Review of Symptoms: No symptoms reported Neurological Self-Management Outcome: 4 (good)  HEENT HEENT Symptoms Reported: No symptoms reported HEENT Management Strategies: Adequate rest, Coping strategies, Routine screening HEENT Self-Management Outcome: 4 (good)    Cardiovascular Cardiovascular Symptoms Reported: Swelling in legs or feet Does patient have uncontrolled Hypertension?: No Cardiovascular Management Strategies: Adequate rest, Coping strategies, Routine screening Weight: 247 lb (112 kg) Cardiovascular Self-Management Outcome: 3 (uncertain) Cardiovascular Comment: Patient reports that she checks her blood pressure daily but could not remember her readings.  Patient reports that her weight today was 247lbs.  Patient encouraged to continue to check her blood pressure daily and record readings.  Respiratory Respiratory Symptoms Reported: No symptoms reported Respiratory Management Strategies: Adequate rest, Coping strategies, Routine screening Respiratory Self-Management Outcome: 3 (uncertain)  Endocrine Endocrine Symptoms Reported: No symptoms reported Is patient diabetic?: Yes Is patient checking blood sugars at home?: Yes List most recent blood sugar readings, include date and time of day: Patient reports that blood sugar today was 94 Endocrine Self-Management Outcome: 4 (good)  Gastrointestinal Gastrointestinal Symptoms Reported: Constipation Additional Gastrointestinal Details: Patient reports that she  takes stool  softners to help with constipation Gastrointestinal Management Strategies: Adequate rest, Diet modification, Fluid modification Gastrointestinal Self-Management Outcome: 3 (uncertain)    Genitourinary Genitourinary Symptoms Reported: No symptoms reported Genitourinary Management Strategies: Adequate rest Genitourinary Self-Management Outcome: 4 (good)  Integumentary Integumentary Symptoms Reported: Wound Additional Integumentary Details: Patient has wound to left skin and left mid thigh.  Wound Care Center in [place] is changing dressing weekly. Skin Management Strategies: Adequate rest, Coping strategies, Dressing changes, Routine screening, Medical device Skin Self-Management Outcome: 3 (uncertain)  Musculoskeletal Musculoskelatal Symptoms Reviewed: Joint pain, Muscle pain, Weakness, Difficulty walking Additional Musculoskeletal Details: Patient reports that she has been having problems with her shoulders.  She reports that x-rays where taken of her shoulders which showed arthritis.  Patient also reports pain in her left shin area and left mid-thigh area.  Patient reports that pain level is 8/10 on pain scale.  Patient reports that she is taking Tylenol  650 mg every 6 hours for pain. Musculoskeletal Management Strategies: Medical device, Medication therapy, Adequate rest, Coping strategies, Routine screening Musculoskeletal Self-Management Outcome: 3 (uncertain) Falls in the past year?: No Number of falls in past year: 1 or less Was there an injury with Fall?: No Fall Risk Category Calculator: 0 Patient Fall Risk Level: Low Fall Risk Patient at Risk for Falls Due to: No Fall Risks Fall risk Follow up: Falls evaluation completed  Psychosocial Psychosocial Symptoms Reported: Sadness - if selected complete PHQ 2-9, Depression - if selected complete PHQ 2-9 Additional Psychological Details: Patient reports that she is active with [facility]. Behavioral Management Strategies: Adequate rest,  Counseling, Coping strategies, Medication therapy Behavioral Health Self-Management Outcome: 3 (uncertain) Major Change/Loss/Stressor/Fears (CP): Medical condition, self Techniques to Cope with Loss/Stress/Change: Counseling, Diversional activities, Medication Quality of Family Relationships: helpful, involved, supportive Do you feel physically threatened by others?: No    03/24/2024    PHQ2-9 Depression Screening   Little interest or pleasure in doing things Nearly every day  Feeling down, depressed, or hopeless Nearly every day  PHQ-2 - Total Score 6  Trouble falling or staying asleep, or sleeping too much Several days  Feeling tired or having little energy Several days  Poor appetite or overeating  Not at all  Feeling bad about yourself - or that you are a failure or have let yourself or your family down Not at all  Trouble concentrating on things, such as reading the newspaper or watching television Not at all  Moving or speaking so slowly that other people could have noticed.  Or the opposite - being so fidgety or restless that you have been moving around a lot more than usual Several days  Thoughts that you would be better off dead, or hurting yourself in some way Not at all  PHQ2-9 Total Score 9  If you checked off any problems, how difficult have these problems made it for you to do your work, take care of things at home, or get along with other people Not difficult at all  Depression Interventions/Treatment      Today's Vitals   03/24/24 1135  Weight: 247 lb (112 kg)   Pain Scale: 0-10 Pain Score: 8  Pain Type: Chronic pain Pain Location: Leg Pain Orientation: Left, Mid Pain Descriptors / Indicators: Aching, Discomfort Pain Onset: On-going Patients Stated Pain Goal: 8 Pain Intervention(s): Medication (See eMAR), Other (Comment) (Dressing changes)  Medications Reviewed Today     Reviewed by Jorja Nichole LABOR, RN (Case Manager) on 03/24/24 at 1125  Med List  Status:  <None>   Medication Order Taking? Sig Documenting Provider Last Dose Status Informant  acetaminophen  (TYLENOL ) 325 MG tablet [id number] Yes Take 2 tablets (650 mg total) by mouth every 6 (six) hours as needed for mild pain (pain score 1-3) or fever (or Fever >/= 101). Barbarann Nest, MD  Active   amLODipine  (NORVASC ) 2.5 MG tablet [id number] Yes Take 1 tablet (2.5 mg total) by mouth daily. Barbarann Nest, MD  Active   apixaban  (ELIQUIS ) 5 MG TABS tablet [id number] Yes Take 1 tablet (5 mg total) by mouth 2 (two) times daily. [name], MD  Active Self, Pharmacy Records  atorvastatin  (LIPITOR) 40 MG tablet [id number] Yes TAKE 1 TABLET BY MOUTH DAILY [name], MD  Active   cholecalciferol  (VITAMIN D3) 25 MCG (1000 UNIT) tablet [id number] Yes Take 1 tablet (1,000 Units total) by mouth daily. [name], MD  Active Self, Pharmacy Records  docusate sodium  [facility]) 100 MG capsule [id number] Yes Take 1 capsule (100 mg total) by mouth 2 (two) times daily as needed for mild constipation.  Patient taking differently: Take 100 mg by mouth daily.   [name], MD  Active Self, Pharmacy Records  FEROSUL 325 [phone] Fe) MG tablet [id number] Yes TAKE ONE TABLET BY MOUTH DAILY WITH BREAKFAST Mcquerry, Deborah B, MD  Active Self, Pharmacy Records  gabapentin  (NEURONTIN ) 400 MG capsule [id number] Yes Take 1 capsule (400 mg total) by mouth 3 (three) times daily. Barbarann Nest, MD  Active   melatonin 3 MG TABS tablet [id number] Yes Take 3 mg by mouth at bedtime. [provider]  Active Self, Pharmacy Records  methocarbamol  (ROBAXIN ) 500 MG tablet [id number] Yes TAKE 1 TABLET BY MOUTH 2 TIMES A DAY AS NEEDED FOR MUSCLE SPASMS [name], MD  Active   mometasone -formoterol  (DULERA ) 200-5 MCG/ACT AERO [id number] Yes INHALE 2 PUFFS EVERY MORNING AND 2 PUFFS EVERY NIGHT AT BEDTIME [name], MD  Active Self, Pharmacy Records  nitroGLYCERIN  (NITROSTAT ) 0.4 MG SL tablet [id number]  Yes DISSOLVE 1 TABLET UNDER THE TONGUE AS NEEDED FOR CHEST PAIN EVERY 5 MINUTES UP TO 3 TIMES. IF NO RELIEF CALL 911.  Patient taking differently: Place 0.4 mg under the tongue every 5 (five) minutes as needed for chest pain.   [name], MD  Active Pharmacy Records, Self           Med Note (CRUTHIS, CHLOE C   Tue Mar 09, 2024  7:20 AM)    PARoxetine  (PAXIL ) 30 MG tablet [id number]  Take 1 tablet (30 mg total) by mouth daily. [name], MD  Active Self, Pharmacy Records  Potassium Chloride  ER 20 MEQ TBCR [id number]  Take 1 tablet (20 mEq total) by mouth daily as needed (cramping).  Patient not taking: Reported on 03/10/2024   [name], MD  Active   Semaglutide , 2 MG/DOSE, (OZEMPIC , 2 MG/DOSE,) 8 MG/3ML SOPN [id number]  DIAL AND INJECT 2 MG UNDER THE SKIN ONCE WEEKLY  Patient taking differently: Inject 2 mg into the skin once a week.   [name], MD  Active Self, Pharmacy Records  spironolactone  (ALDACTONE ) 25 MG tablet [id number] Yes Take 1 tablet (25 mg total) by mouth daily. [name], MD  Active Self, Pharmacy Records           Med Note (CRUTHIS, CHLOE C   Tue Mar 09, 2024  9:27 AM) No fill hx found. Pt is adamant he takes this medication.  torsemide  (DEMADEX )  20 MG tablet [id number] Yes Take 2 tablets (40 mg total) by mouth daily. [name], MD  Active Pharmacy Records, Self           Med Note (CRUTHIS, CHLOE C   Tue Mar 09, 2024  7:20 AM)    triamcinolone cream (KENALOG) 0.1 % [id number] Yes Apply 1 Application topically daily as needed (skin irritation). [provider]  Active Pharmacy Records, Self  vitamin B-12 (CYANOCOBALAMIN ) 1000 MCG tablet [id number] Yes Take 1,000 mcg by mouth daily. [provider]  Active Self, Pharmacy Records            Recommendation:   PCP Follow-up Specialty provider follow-up Oncology-05/03/24; [facility]- 05/06/24 Continue Current Plan of Care  Follow Up Plan:   Telephone follow-up in 1  month: 05/10/24 @ 2:30 pm  Chesky Heyer, RN, BSN, ACM RN Care Manager [employer] [phone]

## 2024-03-24 NOTE — Patient Instructions (Signed)
 Visit Information  [name] was given information about Medicaid Managed Care team care coordination services as a part of their [facility] Community Plan Medicaid benefit.   If you would like to schedule transportation through your [facility], please call the following number at least 2 days in advance of your appointment: [phone]   Rides for urgent appointments can also be made after hours by calling Member Services.  Call the Behavioral Health Crisis Line at [phone], at any time, 24 hours a day, 7 days a week. If you are in danger or need immediate medical attention call 911.  Please see education materials related to CHF and HTN provided by MyChart link.  Care plan and visit instructions communicated with the patient verbally today. Patient agrees to receive a copy in MyChart. Active MyChart status and patient understanding of how to access instructions and care plan via MyChart confirmed with patient.     Telephone follow up appointment with Managed Medicaid care management team member scheduled for: 05/11/23 @ 2:30 pm  [name], RN, BSN, [employer] RN Care Manager [employer] [phone]

## 2024-03-26 ENCOUNTER — Emergency Department (HOSPITAL_BASED_OUTPATIENT_CLINIC_OR_DEPARTMENT_OTHER)

## 2024-03-26 ENCOUNTER — Encounter (HOSPITAL_BASED_OUTPATIENT_CLINIC_OR_DEPARTMENT_OTHER): Payer: Self-pay | Admitting: Emergency Medicine

## 2024-03-26 ENCOUNTER — Other Ambulatory Visit: Payer: Self-pay

## 2024-03-26 ENCOUNTER — Emergency Department (HOSPITAL_BASED_OUTPATIENT_CLINIC_OR_DEPARTMENT_OTHER)
Admission: EM | Admit: 2024-03-26 | Discharge: 2024-03-26 | Disposition: A | Discharge status: Home / Self Care | Attending: Emergency Medicine | Admitting: Emergency Medicine

## 2024-03-26 DIAGNOSIS — R600 Localized edema: Secondary | ICD-10-CM | POA: Diagnosis not present

## 2024-03-26 DIAGNOSIS — S0083XA Contusion of other part of head, initial encounter: Secondary | ICD-10-CM | POA: Insufficient documentation

## 2024-03-26 DIAGNOSIS — I110 Hypertensive heart disease with heart failure: Secondary | ICD-10-CM | POA: Diagnosis not present

## 2024-03-26 DIAGNOSIS — Z7901 Long term (current) use of anticoagulants: Secondary | ICD-10-CM | POA: Insufficient documentation

## 2024-03-26 DIAGNOSIS — Y9301 Activity, walking, marching and hiking: Secondary | ICD-10-CM | POA: Diagnosis not present

## 2024-03-26 DIAGNOSIS — R220 Localized swelling, mass and lump, head: Secondary | ICD-10-CM | POA: Diagnosis not present

## 2024-03-26 DIAGNOSIS — R519 Headache, unspecified: Secondary | ICD-10-CM | POA: Diagnosis present

## 2024-03-26 DIAGNOSIS — Z79899 Other long term (current) drug therapy: Secondary | ICD-10-CM | POA: Insufficient documentation

## 2024-03-26 DIAGNOSIS — W19XXXA Unspecified fall, initial encounter: Secondary | ICD-10-CM

## 2024-03-26 DIAGNOSIS — E119 Type 2 diabetes mellitus without complications: Secondary | ICD-10-CM | POA: Diagnosis not present

## 2024-03-26 DIAGNOSIS — I5030 Unspecified diastolic (congestive) heart failure: Secondary | ICD-10-CM | POA: Insufficient documentation

## 2024-03-26 DIAGNOSIS — W0110XA Fall on same level from slipping, tripping and stumbling with subsequent striking against unspecified object, initial encounter: Secondary | ICD-10-CM | POA: Diagnosis not present

## 2024-03-26 MED ORDER — ACETAMINOPHEN 500 MG PO TABS
1000.0000 mg | ORAL_TABLET | Freq: Once | ORAL | Status: AC
  Administered 2024-03-26: 1000 mg via ORAL
  Filled 2024-03-26: qty 2

## 2024-03-26 MED ORDER — OXYCODONE HCL 5 MG PO TABS
5.0000 mg | ORAL_TABLET | Freq: Once | ORAL | Status: AC
  Administered 2024-03-26: 5 mg via ORAL
  Filled 2024-03-26: qty 1

## 2024-03-26 NOTE — ED Notes (Signed)
 Cab voucher and instruction given during discharge.

## 2024-03-26 NOTE — Discharge Instructions (Addendum)
 The CT scan of your head did not show any brain bleeds or skull fractures. The pain around your right eye seems to be due to an underlying bone bruise due to the impact of the fall.  This will take time to heal.  As that heals, your pain will start to improve.  You may take up to 1000mg  of tylenol  every 6 hours as needed for pain.  Do not take more then 4g per day.  You may apply heat or ice to the area as needed to help with pain  You were found to have an enlarged vein in your chest on your CT scan imaging.  This has been present on previous imaging.  You need to follow-up with our vascular surgeon, Dr. Pearline, within the next month for further evaluation and monitoring of this enlarged vein.  Please call his office listed below to schedule an appointment within the next month  Please return to the ER if you have any severe headaches, vision changes, chest pain, shortness of breath, any other new or concerning symptoms

## 2024-03-26 NOTE — ED Triage Notes (Signed)
 Pt via pov from home after a fall on Monday, where she hit her right temple/forehead . No LOC. States she is having more pain at the right temple area today. Pt takes Eliquis . Pt a&o x 4; nad noted.

## 2024-03-26 NOTE — ED Provider Notes (Signed)
 [place] EMERGENCY DEPARTMENT AT [facility] Provider Note   CSN: [id number] Arrival date & time: 03/26/24  9086     Patient presents with: Felton Setter [name] is a 53 y.o. female with history of DVT on Eliquis , HFpEF, type 2 diabetes, hypertension, presents with concern for a mechanical fall that occurred 5 days ago.  She reports that she was walking her dog when she tripped over the dog's leash, causing her to fall.  She reports hitting the right side of her face near her eye.  She reports continued pain around her right eye since this fall.  Denies any changes in her vision.  She does report a headache in the right temple area since her fall.  Denies pain elsewhere.    Fall Associated symptoms include headaches.       Prior to Admission medications   Medication Sig Start Date End Date Taking? Authorizing Provider  acetaminophen  (TYLENOL ) 325 MG tablet Take 2 tablets (650 mg total) by mouth every 6 (six) hours as needed for mild pain (pain score 1-3) or fever (or Fever >/= 101). 03/09/24   Barbarann Nest, MD  amLODipine  (NORVASC ) 2.5 MG tablet Take 1 tablet (2.5 mg total) by mouth daily. 03/09/24   Barbarann Nest, MD  apixaban  (ELIQUIS ) 5 MG TABS tablet Take 1 tablet (5 mg total) by mouth 2 (two) times daily. 12/08/23   Louder Barnie NOVAK, MD  atorvastatin  (LIPITOR) 40 MG tablet TAKE 1 TABLET BY MOUTH DAILY 03/12/24   Louder Barnie NOVAK, MD  cholecalciferol  (VITAMIN D3) 25 MCG (1000 UNIT) tablet Take 1 tablet (1,000 Units total) by mouth daily. 08/07/23   Louder Barnie NOVAK, MD  docusate sodium  (COLACE) 100 MG capsule Take 1 capsule (100 mg total) by mouth 2 (two) times daily as needed for mild constipation. Patient taking differently: Take 100 mg by mouth daily. 08/07/23   Louder Barnie NOVAK, MD  FEROSUL 325 (65 Fe) MG tablet TAKE ONE TABLET BY MOUTH DAILY WITH BREAKFAST 06/06/21   Louder Barnie NOVAK, MD  gabapentin  (NEURONTIN ) 400 MG capsule Take 1 capsule (400  mg total) by mouth 3 (three) times daily. 03/09/24   Barbarann Nest, MD  melatonin 3 MG TABS tablet Take 3 mg by mouth at bedtime. 01/14/24   [provider]  methocarbamol  (ROBAXIN ) 500 MG tablet TAKE 1 TABLET BY MOUTH 2 TIMES A DAY AS NEEDED FOR MUSCLE SPASMS 03/12/24   Louder Barnie NOVAK, MD  mometasone -formoterol  (DULERA ) 200-5 MCG/ACT AERO INHALE 2 PUFFS EVERY MORNING AND 2 PUFFS EVERY NIGHT AT BEDTIME 08/07/23   Louder Barnie NOVAK, MD  nitroGLYCERIN  (NITROSTAT ) 0.4 MG SL tablet DISSOLVE 1 TABLET UNDER THE TONGUE AS NEEDED FOR CHEST PAIN EVERY 5 MINUTES UP TO 3 TIMES. IF NO RELIEF CALL 911. Patient taking differently: Place 0.4 mg under the tongue every 5 (five) minutes as needed for chest pain. 03/17/23   Louder Barnie NOVAK, MD  PARoxetine  (PAXIL ) 30 MG tablet Take 1 tablet (30 mg total) by mouth daily. 02/10/24   Louder Barnie NOVAK, MD  Potassium Chloride  ER 20 MEQ TBCR Take 1 tablet (20 mEq total) by mouth daily as needed (cramping). Patient not taking: Reported on 03/10/2024 03/10/24   Louder Barnie NOVAK, MD  Semaglutide , 2 MG/DOSE, (OZEMPIC , 2 MG/DOSE,) 8 MG/3ML SOPN DIAL AND INJECT 2 MG UNDER THE SKIN ONCE WEEKLY Patient taking differently: Inject 2 mg into the skin once a week. 10/24/23   Louder Barnie NOVAK, MD  spironolactone  (ALDACTONE ) 25 MG  tablet Take 1 tablet (25 mg total) by mouth daily. 03/06/23 03/24/24  [name], MD  torsemide  (DEMADEX ) 20 MG tablet Take 2 tablets (40 mg total) by mouth daily. 03/06/23 03/24/24  [name], MD  triamcinolone cream (KENALOG) 0.1 % Apply 1 Application topically daily as needed (skin irritation). 02/08/23   [provider]  vitamin B-12 (CYANOCOBALAMIN ) 1000 MCG tablet Take 1,000 mcg by mouth daily.    [provider]    Allergies: Ace inhibitors, Hydromorphone, Vancomycin, Contrast media [iodinated contrast media], Dilaudid [hydromorphone hcl], Farxiga  [dapagliflozin ], and Lidocaine     Review of Systems   Neurological:  Positive for headaches.    Updated Vital Signs BP (!) 127/95   Pulse 90   Temp 97.6 F (36.4 C) (Oral)   Resp 18   Ht 5' 6 (1.676 m)   Wt 111.1 kg   SpO2 96%   BMI 39.54 kg/m   Physical Exam Vitals and nursing note reviewed.  Constitutional:      General: She is not in acute distress.    Appearance: She is well-developed.  HENT:     Head: Normocephalic and atraumatic.     Comments: Scabbed over abrasion to the right temple.  No surrounding erythema or edema.  No purulent drainage.  No ecchymoses noted.  No lacerations.  Patient with tenderness to the right zygomatic bone.  Nontender elsewhere the skull diffusely Eyes:     Extraocular Movements: Extraocular movements intact.     Conjunctiva/sclera: Conjunctivae normal.     Pupils: Pupils are equal, round, and reactive to light.  Cardiovascular:     Rate and Rhythm: Normal rate and regular rhythm.     Heart sounds: No murmur heard. Pulmonary:     Effort: Pulmonary effort is normal. No respiratory distress.     Breath sounds: Normal breath sounds.  Abdominal:     Palpations: Abdomen is soft.     Tenderness: There is no abdominal tenderness.  Musculoskeletal:        General: No swelling.     Cervical back: Neck supple.     Comments: General No obvious deformity. No erythema, edema, contusions, open wounds   1+ pitting edema of the bilateral lower extremities which patient states is baseline for years  Palpation Non-tender to palpation of the clavicles,humerus, radius and ulna, carpal bones, 1st-5th metacarpals and phalanges bilaterally Non tender over the femur, patella, tibia or fibula bilaterally  Tender over the distal aspect of the cervical spine and proximal aspect of the thoracic spine.  Nontender over the lumbar spine. Non-tender to palpation of the paraspinal region of the back or chest wall diffusely  No tenderness of the pelvis diffusely  ROM Full ROM of shoulders bilaterally Full  elbow, wrist, knee flexion and extension bilaterally Intact plantarflexion and dorsiflexion, hip flexion bilaterally Able to ambulate without difficulty  Sensation: Sensation intact throughout the bilateral upper and lower extremity  Strength: 5/5 strength with resisted elbow and wrist flexion and extension bilaterally 5/5 strength with resisted knee flexion and extension and ankle plantarflexion and dorsiflexion bilaterally    Skin:    General: Skin is warm and dry.     Capillary Refill: Capillary refill takes less than 2 seconds.  Neurological:     Mental Status: She is alert.  Psychiatric:        Mood and Affect: Mood normal.     (all labs ordered are listed, but only abnormal results are displayed) Labs Reviewed - No data to display  EKG: None  Radiology: CT Thoracic Spine Wo Contrast Result Date: 03/26/2024 EXAM: CT THORACIC SPINE WITHOUT CONTRAST 03/26/2024 10:14:11 AM TECHNIQUE: CT of the thoracic spine was performed without the administration of intravenous contrast. Multiplanar reformatted images are provided for review. Automated exposure control, iterative reconstruction, and/or weight based adjustment of the mA/kV was utilized to reduce the radiation dose to as low as reasonably achievable. COMPARISON: None available. CLINICAL HISTORY: Back trauma, no prior imaging (Age >= 16y); tenderness to upper thoracic spine after fall. FINDINGS: BONES AND ALIGNMENT: Normal vertebral body heights. No acute fracture or suspicious bone lesion. Normal alignment. DEGENERATIVE CHANGES: Mild diffuse chronic degenerative disc disease present throughout the thoracic spine, manifested by disc space narrowing and mild endplate ridging. There are calcifications present within the posterior longitudinal ligaments at several levels, causing mild central spinal canal stenosis. There is no apparent impingement of the spinal cord or exiting nerve roots. 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 This is most commonly seen with occlusion of the inferior vena cava. IMPRESSION: 1. No acute abnormality of the thoracic spine related to the reported trauma. 2. 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 No impingement of the spinal cord or exiting nerve roots. 3. 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 Electronically signed by: Evalene Coho MD 03/26/2024 11:06 AM EST RP Workstation: [id number]   CT Head Wo Contrast Result Date: 03/26/2024 CLINICAL DATA:  Patient fell while walking her dog. EXAM: CT HEAD WITHOUT CONTRAST CT MAXILLOFACIAL WITHOUT CONTRAST CT CERVICAL SPINE WITHOUT CONTRAST TECHNIQUE: Multidetector CT imaging of the head, cervical spine, and maxillofacial structures were performed using the standard protocol without intravenous contrast. Multiplanar CT image reconstructions of the cervical spine and maxillofacial structures were also generated. RADIATION DOSE REDUCTION: This exam was performed according to the departmental dose-optimization program which includes automated exposure control, adjustment of the mA and/or kV according to patient size and/or use of iterative reconstruction technique. COMPARISON:  11/25/2022. FINDINGS: CT HEAD FINDINGS Brain: No evidence of acute infarction, hemorrhage, hydrocephalus, extra-axial collection or mass lesion/mass effect. Vascular: No hyperdense vessel or unexpected calcification. Skull: Normal. Negative for fracture or focal lesion. Other: None. CT MAXILLOFACIAL FINDINGS Osseous: No fracture or mandibular dislocation. No destructive process. Orbits: Mild right preseptal periorbital soft tissue edema/contusion. No injury to the right globe or postseptal orbit. Normal left globe and orbit.  Sinuses: Clear. Soft tissues: No other soft tissue abnormality. CT CERVICAL SPINE FINDINGS Alignment: Mild kyphosis, apex at C4, likely positional. No spondylolisthesis. Skull base and vertebrae: No acute fracture. No primary bone lesion or focal pathologic process. Soft tissues and spinal canal: No prevertebral fluid or swelling. No visible canal hematoma. Disc levels: Moderate loss of disc height throughout the cervical spine with endplate spurring and mild endplate sclerosis. Mild disc bulging. No convincing disc herniation. Upper chest: Unremarkable. Other: None. IMPRESSION: HEAD CT 1. No acute intracranial abnormalities. MAXILLOFACIAL CT 1. No fracture. 2. Mild right preseptal periorbital soft tissue swelling/contusion. CERVICAL CT 1. No fracture or acute finding. Electronically Signed   By: Alm Parkins M.D.   On: 03/26/2024 10:22   CT Maxillofacial Wo Contrast Result Date: 03/26/2024 CLINICAL DATA:  Patient fell while walking her dog. EXAM: CT HEAD WITHOUT CONTRAST CT MAXILLOFACIAL WITHOUT CONTRAST CT CERVICAL SPINE WITHOUT CONTRAST TECHNIQUE: Multidetector CT imaging of the  head, cervical spine, and maxillofacial structures were performed using the standard protocol without intravenous contrast. Multiplanar CT image reconstructions of the cervical spine and maxillofacial structures were also generated. RADIATION DOSE REDUCTION: This exam was performed according to the departmental dose-optimization program which includes automated exposure control, adjustment of the mA and/or kV according to patient size and/or use of iterative reconstruction technique. COMPARISON:  11/25/2022. FINDINGS: CT HEAD FINDINGS Brain: No evidence of acute infarction, hemorrhage, hydrocephalus, extra-axial collection or mass lesion/mass effect. Vascular: No hyperdense vessel or unexpected calcification. Skull: Normal. Negative for fracture or focal lesion. Other: None. CT MAXILLOFACIAL FINDINGS Osseous: No fracture or  mandibular dislocation. No destructive process. Orbits: Mild right preseptal periorbital soft tissue edema/contusion. No injury to the right globe or postseptal orbit. Normal left globe and orbit. Sinuses: Clear. Soft tissues: No other soft tissue abnormality. CT CERVICAL SPINE FINDINGS Alignment: Mild kyphosis, apex at C4, likely positional. No spondylolisthesis. Skull base and vertebrae: No acute fracture. No primary bone lesion or focal pathologic process. Soft tissues and spinal canal: No prevertebral fluid or swelling. No visible canal hematoma. Disc levels: Moderate loss of disc height throughout the cervical spine with endplate spurring and mild endplate sclerosis. Mild disc bulging. No convincing disc herniation. Upper chest: Unremarkable. Other: None. IMPRESSION: HEAD CT 1. No acute intracranial abnormalities. MAXILLOFACIAL CT 1. No fracture. 2. Mild right preseptal periorbital soft tissue swelling/contusion. CERVICAL CT 1. No fracture or acute finding. Electronically Signed   By: Alm Parkins M.D.   On: 03/26/2024 10:22   CT Cervical Spine Wo Contrast Result Date: 03/26/2024 CLINICAL DATA:  Patient fell while walking her dog. EXAM: CT HEAD WITHOUT CONTRAST CT MAXILLOFACIAL WITHOUT CONTRAST CT CERVICAL SPINE WITHOUT CONTRAST TECHNIQUE: Multidetector CT imaging of the head, cervical spine, and maxillofacial structures were performed using the standard protocol without intravenous contrast. Multiplanar CT image reconstructions of the cervical spine and maxillofacial structures were also generated. RADIATION DOSE REDUCTION: This exam was performed according to the departmental dose-optimization program which includes automated exposure control, adjustment of the mA and/or kV according to patient size and/or use of iterative reconstruction technique. COMPARISON:  11/25/2022. FINDINGS: CT HEAD FINDINGS Brain: No evidence of acute infarction, hemorrhage, hydrocephalus, extra-axial collection or mass  lesion/mass effect. Vascular: No hyperdense vessel or unexpected calcification. Skull: Normal. Negative for fracture or focal lesion. Other: None. CT MAXILLOFACIAL FINDINGS Osseous: No fracture or mandibular dislocation. No destructive process. Orbits: Mild right preseptal periorbital soft tissue edema/contusion. No injury to the right globe or postseptal orbit. Normal left globe and orbit. Sinuses: Clear. Soft tissues: No other soft tissue abnormality. CT CERVICAL SPINE FINDINGS Alignment: Mild kyphosis, apex at C4, likely positional. No spondylolisthesis. Skull base and vertebrae: No acute fracture. No primary bone lesion or focal pathologic process. Soft tissues and spinal canal: No prevertebral fluid or swelling. No visible canal hematoma. Disc levels: Moderate loss of disc height throughout the cervical spine with endplate spurring and mild endplate sclerosis. Mild disc bulging. No convincing disc herniation. Upper chest: Unremarkable. Other: None. IMPRESSION: HEAD CT 1. No acute intracranial abnormalities. MAXILLOFACIAL CT 1. No fracture. 2. Mild right preseptal periorbital soft tissue swelling/contusion. CERVICAL CT 1. No fracture or acute finding. Electronically Signed   By: Alm Parkins M.D.   On: 03/26/2024 10:22     Procedures   Medications Ordered in the ED  oxyCODONE  (Oxy IR/ROXICODONE ) immediate release tablet 5 mg (5 mg Oral Given 03/26/24 0945)  acetaminophen  (TYLENOL ) tablet 1,000 mg (1,000 mg Oral Given 03/26/24 0945)  Medical Decision Making Amount and/or Complexity of Data Reviewed Radiology: ordered.  Risk OTC drugs. Prescription drug management.     Differential diagnosis includes but is not limited to intracranial hemorrhage, skull fracture, cervical or thoracic spinal fracture   ED Course:  Upon initial evaluation, patient is well-appearing, no acute distress.  Normal vital signs.  Reporting pain to the right temple area as this  is where she hit her face when she fell.  There is a scabbed over abrasion to the right temple on exam.  No surrounding erythema, edema, purulent drainage to suggest infection.  She has intact EOM bilaterally.  Pupils are equal round reactive bilaterally.  She does have tenderness to the right zygomatic.  Also has tenderness to the distal aspect of the cervical spine and proximal thoracic spine on exam.  Nontender to the lumbar spine.  Nontender to the upper or lower extremities diffusely.  She has full range of motion of the upper lower extremities and is able to ambulate without difficulty.  5/5 strength and intact sensation in the upper and lower extremities bilaterally Patient has good story for mechanical fall as she tripped over her dog's leash.  She did not have any chest pain, dizziness, no syncope.  Doubt any cardiac etiology to her fall.  No indication for labs at this time.    Imaging Studies ordered: I ordered imaging studies including CT head, CT maxillofacial, CT cervical spine, CT thoracic spine I independently visualized the imaging with scope of interpretation limited to determining acute life threatening conditions related to emergency care. Imaging showed  IMPRESSION:  HEAD CT    1. No acute intracranial abnormalities.    MAXILLOFACIAL CT    1. No fracture.  2. Mild right preseptal periorbital soft tissue swelling/contusion.    CERVICAL CT    1. No fracture or acute finding.   CT thoracic spine:  IMPRESSION:  1. No acute abnormality of the thoracic spine related to the reported trauma.  2. 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 No impingement of the spinal cord or exiting  nerve roots.  3. 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   I agree with the radiologist interpretation   Consultations Obtained: My attending Dr. Charlyn consulted with vascular Dr. Pearline regarding the dilated azygous vein on patient's CT scan, Dr. Pearline recommends no emergent evaluation as long as patient not having any new/acute leg swelling or pain. He recommends outpatient follow up in his office   Medications Given: Oxycodone  Tylenol   Upon re-evaluation, patient remains well-appearing, reports her headache has improved with the medications given.  Her CT head and maxillofacial did not reveal any intracranial hemorrhage or skull fracture.  Some soft tissue edema was noted in the patient's area of pain around the right eye.  Given her pain in this area, suspect she may have underlying bone contusion causing some pain.  Her CT scan of the cervical spine without acute abnormality.   CT scan of the thoracic spine without any thoracic spine injury.  However, incidental finding of dilated azygous vein was noted.  My attending Dr. Charlyn consulted vascular surgery Dr. Pearline regarding this finding.  Since patient is not having any new leg swelling or pain, we will proceed with outpatient follow-up for continued monitoring of this  finding.  This finding has been noted previously on a chest CT that was obtained in March 2025, this does not seem like a new finding.  This finding was discussed with the patient and she verbalizes understanding to follow-up outpatient with vascular surgery.  My attending Dr. Charlyn personally evaluated patient and agrees with plan of care. Patient is stable and appropriate for discharge home  Impression: Mechanical fall Right sided facial contusion  Disposition:  Discharge home with instructions to use Tylenol  as needed for pain.  May apply ice or heat to her face to improve pain.  She understands that there is an enlarged azygous vein noted on her CT scan that she needs to follow-up with  Dr. Pearline with vascular surgery within the next month for further management. Return precautions given and patient verbalized understanding.    Record Review: External records from outside source obtained and reviewed including the scans from March 2025: Moderate cardiomegaly. 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     This chart was dictated using voice recognition software, Dragon. Despite the best efforts of this provider to proofread and correct errors, errors may still occur which can change documentation meaning.       Final diagnoses:  Fall, initial encounter  Facial contusion, initial encounter    ED Discharge Orders     None          Veta Palma, [place] 03/26/24 1436    Charlyn Sora, MD 03/26/24 [phone]

## 2024-04-01 ENCOUNTER — Encounter (HOSPITAL_BASED_OUTPATIENT_CLINIC_OR_DEPARTMENT_OTHER): Admitting: General Surgery

## 2024-04-01 DIAGNOSIS — Z87891 Personal history of nicotine dependence: Secondary | ICD-10-CM | POA: Insufficient documentation

## 2024-04-01 DIAGNOSIS — I110 Hypertensive heart disease with heart failure: Secondary | ICD-10-CM | POA: Diagnosis not present

## 2024-04-01 DIAGNOSIS — Z6841 Body Mass Index (BMI) 40.0 and over, adult: Secondary | ICD-10-CM | POA: Diagnosis not present

## 2024-04-01 DIAGNOSIS — I5032 Chronic diastolic (congestive) heart failure: Secondary | ICD-10-CM | POA: Diagnosis not present

## 2024-04-01 DIAGNOSIS — L97822 Non-pressure chronic ulcer of other part of left lower leg with fat layer exposed: Secondary | ICD-10-CM | POA: Insufficient documentation

## 2024-04-01 DIAGNOSIS — Z7901 Long term (current) use of anticoagulants: Secondary | ICD-10-CM | POA: Diagnosis not present

## 2024-04-01 DIAGNOSIS — I872 Venous insufficiency (chronic) (peripheral): Secondary | ICD-10-CM | POA: Insufficient documentation

## 2024-04-01 DIAGNOSIS — E11622 Type 2 diabetes mellitus with other skin ulcer: Secondary | ICD-10-CM | POA: Diagnosis not present

## 2024-04-01 DIAGNOSIS — I890 Lymphedema, not elsewhere classified: Secondary | ICD-10-CM | POA: Insufficient documentation

## 2024-04-01 DIAGNOSIS — Z86718 Personal history of other venous thrombosis and embolism: Secondary | ICD-10-CM | POA: Insufficient documentation

## 2024-04-05 ENCOUNTER — Telehealth (HOSPITAL_COMMUNITY): Payer: Self-pay | Admitting: Clinical

## 2024-04-07 ENCOUNTER — Other Ambulatory Visit: Payer: Self-pay | Admitting: Internal Medicine

## 2024-04-07 DIAGNOSIS — E1169 Type 2 diabetes mellitus with other specified complication: Secondary | ICD-10-CM

## 2024-04-07 NOTE — Telephone Encounter (Unsigned)
 Copied from CRM #[id number]. Topic: Clinical - Medication Refill >> Apr 07, 2024  2:34 PM Victoria B wrote: Medication: PARoxetine  (PAXIL ) 30 MG tablet Semaglutide , 2 MG/DOSE, (OZEMPIC , 2  MG/DOSE,) 8 MG/3ML SOPN Has the patient contacted their pharmacy? yes (Agent: If no, request that the patient contact the pharmacy for the refill. If patient does not wish to contact the pharmacy document the reason why and proceed with request.) (Agent: If yes, when and what did the pharmacy advise?)contact pcp  This is the patient's preferred pharmacy:  [facility] [id number] - [place], [place] - [address] [place] [address] Phone: [phone] Fax: [phone]  Is this the correct pharmacy for this prescription? yes  Has the prescription been filled recently? no  Is the patient out of the medication? yes  Has the patient been seen for an appointment in the last year OR does the patient have an upcoming appointment? yes  Can we respond through MyChart? no  Agent: Please be advised that Rx refills may take up to 3 business days. We ask that you follow-up with your pharmacy.

## 2024-04-08 ENCOUNTER — Ambulatory Visit: Admitting: Internal Medicine

## 2024-04-09 ENCOUNTER — Other Ambulatory Visit: Payer: Self-pay | Admitting: Internal Medicine

## 2024-04-09 ENCOUNTER — Encounter (HOSPITAL_BASED_OUTPATIENT_CLINIC_OR_DEPARTMENT_OTHER): Admitting: Internal Medicine

## 2024-04-09 MED ORDER — OZEMPIC (2 MG/DOSE) 8 MG/3ML ~~LOC~~ SOPN: PEN_INJECTOR | SUBCUTANEOUS | 2 refills | Status: AC

## 2024-04-09 NOTE — Telephone Encounter (Signed)
 Requested Prescriptions  Pending Prescriptions Disp Refills   PARoxetine  (PAXIL ) 30 MG tablet 30 tablet 6    Sig: Take 1 tablet (30 mg total) by mouth daily.     Psychiatry:  Antidepressants - SSRI Passed - 04/09/2024 12:06 PM      Passed - Completed PHQ-2 or PHQ-9 in the last 360 days      Passed - Valid encounter within last 6 months    Recent Outpatient Visits           4 months ago Type 2 diabetes mellitus with morbid obesity (HCC)   [facility] Dept Of [place]. [name], [place]   5 months ago Encounter for medication refill   [facility] - A Dept Of [place]. [facility] Vicci Barnie B, MD   8 months ago Type 2 diabetes mellitus with morbid obesity [facility])   [facility] - A Dept Of [place]. [facility] [name], MD   8 months ago Rib contusion, left, sequela   [facility] [place] [facility] - A Dept Of [place]. [facility], Jon M, [place]   11 months ago Yeast infection   [facility] [place] - A Dept Of [place]. [facility], Jon M, [place]               Semaglutide , 2 MG/DOSE, (OZEMPIC , 2 MG/DOSE,) 8 MG/3ML SOPN 3 mL 4    Sig: DIAL AND INJECT 2 MG UNDER THE SKIN ONCE WEEKLY     Endocrinology:  Diabetes - GLP-1 Receptor Agonists - semaglutide  Passed - 04/09/2024 12:06 PM      Passed - HBA1C in normal range and within 180 days    HbA1c, POC (prediabetic range)  Date Value Ref Range Status  04/07/2020 5.7 5.7 - 6.4 % Final   HbA1c, POC (controlled diabetic range)  Date Value Ref Range Status  12/08/2023 5.6 0.0 - 7.0 % Final   Hgb A1c MFr Bld  Date Value Ref Range Status  03/09/2024 5.0 4.8 - 5.6 % Final    Comment:    (NOTE) Diagnosis of Diabetes The following HbA1c ranges recommended by the American Diabetes Association (ADA) may be used as an aid in the diagnosis of diabetes  mellitus.  Hemoglobin             Suggested A1C NGSP%              Diagnosis  <5.7                   Non Diabetic  5.7-6.4                Pre-Diabetic  >6.4                   Diabetic  <7.0                   Glycemic control for                       adults with diabetes.           Passed - Cr in normal range and within 360 days    Creatinine  Date Value Ref Range Status  10/16/2023 0.73 0.44 - 1.00 mg/dL Final   Creatinine, Ser  Date Value Ref Range Status  03/09/2024 0.50 0.44 -  1.00 mg/dL Final         Passed - Valid encounter within last 6 months    Recent Outpatient Visits           4 months ago Type 2 diabetes mellitus with morbid obesity (HCC)   [place] Dept Of [place]. [facility] [name], MD   5 months ago Encounter for medication refill   [place] [facility] - A Dept Of [place]. [facility] Barnie B, MD   8 months ago Type 2 diabetes mellitus with morbid obesity [facility])   [place] [place] [facility] A Dept Of [place]. [facility] [name], MD   8 months ago Rib contusion, left, sequela   [place] [facility] - A Dept Of [place]. [facility], Jon M, [place]   11 months ago Yeast infection   [facility] [facility] [place] - A Dept Of . [facility], [place], PA-C

## 2024-04-09 NOTE — Telephone Encounter (Unsigned)
 Copied from CRM #[id number]. Topic: Clinical - Medication Refill >> Apr 09, 2024  1:01 PM Santiya F wrote: Medication: PARoxetine  (PAXIL ) 30 MG tablet [[id number]]  Has the patient contacted their pharmacy? Yes  (Agent: If yes, when and what did the pharmacy advise?) contact office   This is the patient's preferred pharmacy:  [facility] [id number] - [place], [place] - [address] [place] [address] Phone: [phone] Fax: [phone]   Is this the correct pharmacy for this prescription? Yes If no, delete pharmacy and type the correct one.   Has the prescription been filled recently? Yes  Is the patient out of the medication? Yes  Has the patient been seen for an appointment in the last year OR does the patient have an upcoming appointment? Yes  Can we respond through MyChart? Yes  Agent: Please be advised that Rx refills may take up to 3 business days. We ask that you follow-up with your pharmacy.

## 2024-04-09 NOTE — Telephone Encounter (Signed)
 Called pharmacy - refills for Paxil  available - pharmacy will prepare.

## 2024-04-09 NOTE — Telephone Encounter (Signed)
°  04/09/2024 01:02 PM EST     Patient is calling in because she wants to know if Dr. Vicci could prescribe her Baclofen or Percosets to help with her pain. Patient says she is seeing wound care because her leg opened back up but she wanted to know if she could be prescribed something.

## 2024-04-10 ENCOUNTER — Other Ambulatory Visit: Payer: Self-pay | Admitting: Internal Medicine

## 2024-04-10 DIAGNOSIS — I5032 Chronic diastolic (congestive) heart failure: Secondary | ICD-10-CM

## 2024-04-11 ENCOUNTER — Other Ambulatory Visit: Payer: Self-pay | Admitting: Internal Medicine

## 2024-04-12 ENCOUNTER — Ambulatory Visit: Payer: Self-pay

## 2024-04-12 ENCOUNTER — Telehealth: Payer: Self-pay | Admitting: Internal Medicine

## 2024-04-12 NOTE — Telephone Encounter (Signed)
°  FYI Only or Action Required?: FYI only for provider: ED advised. And refused Patient was last seen in primary care on 12/08/2023 by [name], MD.  Called Nurse Triage reporting Leg Swelling.  Symptoms began about a month ago.  Interventions attempted: Rest, hydration, or home remedies.  Symptoms are: gradually worsening.  Triage Disposition: Go to ED Now (or PCP Triage)  Patient/caregiver understands and will follow disposition?: No, refuses disposition Copied from CRM #[id number]. Topic: Clinical - Red Word Triage >> Apr 12, 2024  4:18 PM Everette C wrote: [facility] that prompted transfer to Nurse Triage: The patient shares that they have ongoing lymphatic concerns but today their left leg is increasingly swollen, red and having uncomfortable Reason for Disposition  [1] Red area or streak AND [2] fever  Answer Assessment - Initial Assessment Questions Patient called into ask for medication, percocet, to called in for lymphedema pain  1. ONSET: When did the swelling start? (e.g., minutes, hours, days)     Leg opened up during Thanksgiving holiday.  Opened up again on December 3 2. LOCATION: What part of the leg is swollen?  Are both legs swollen or just one leg?    Bilateral legs with lymphedema. Today Left leg worse than right 3. SEVERITY: How bad is the swelling? (e.g., localized; mild, moderate, severe)     Red, swollen, weeping 4. REDNESS: Is there redness or signs of infection?     Redness present 5. PAIN: Is the swelling painful to touch? If Yes, ask: How painful is it?   (Scale 1-10; mild, moderate or severe)     9/10 6. FEVER: Do you have a fever? If Yes, ask: What is it, how was it measured, and when did it start?      denies 7. CAUSE: What do you think is causing the leg swelling?     lymphedema 8. MEDICAL HISTORY: Do you have a history of blood clots (e.g., DVT), cancer, heart failure, kidney disease, or liver failure?     Positive  for history of blood clots 9. RECURRENT SYMPTOM: Have you had leg swelling before? If Yes, ask: When was the last time? What happened that time?     Patient states that she normally walks it out 10. OTHER SYMPTOMS: Do you have any other symptoms? (e.g., chest pain, difficulty breathing)       denies  Protocols used: Leg Swelling and Edema-A-AH

## 2024-04-12 NOTE — Telephone Encounter (Signed)
 Patient has an appointment 04/13/2024 at 8:30 AM, patient is requesting appointment to be a VIDEO VISIT please advise if okay to make the change.

## 2024-04-12 NOTE — Telephone Encounter (Signed)
 FYI Only or Action Required?: Action required by provider: clinical question for provider.medication requested to pharmacy today, has appointment with pcp on 04/13/24  Patient was last seen in primary care on 12/08/2023 by [name], MD.  Called Nurse Triage reporting Leg Pain.  Symptoms began chronic.  Interventions attempted: OTC medications: Advil, Rest, hydration, or home remedies, and Other: Elevating.  Symptoms are: unchanged.  Triage Disposition: See HCP Within 4 Hours (Or PCP Triage)  Patient/caregiver understands and will follow disposition?: No, wishes to speak with PCP  Copied from CRM #[id number]. Topic: Clinical - Red Word Triage >> Apr 12, 2024  2:07 PM Antwanette L wrote: Red Word that prompted transfer to Nurse Triage: The pt is reporting left leg pain. She has an appt w/ a wound care specialist on 12/19. The pt is requesting baclofen or percocet for pain mgmt until that visit Reason for Disposition  [1] SEVERE pain (e.g., excruciating, unable to do any normal activities) AND [2] not improved after 2 hours of pain medicine  Answer Assessment - Initial Assessment Questions Requesting baclofen or percocet from PCP today. Refused urgent care today, has appointment with pcp on 04/13/24. Next wound appointment- Wound care 04/16/24  1. ONSET: When did the pain start?      Chronic lymphadema-open and swollen 2. LOCATION: Where is the pain located?      Left  3. PAIN: How bad is the pain?    (Scale 1-10; or mild, moderate, severe)     Advil without effect, muscle relaxer not helpful.  4. WORK OR EXERCISE: Has there been any recent work or exercise that involved this part of the body?       5. CAUSE: What do you think is causing the leg pain?     lymphaedma 6. OTHER SYMPTOMS: Do you have any other symptoms? (e.g., chest pain, back pain, breathing difficulty, swelling, rash, fever, numbness, weakness)     Swelling, odor noted with dressing changes, denies  drainage.  7. PREGNANCY: Is there any chance you are pregnant? When was your last menstrual period?  Protocols used: Leg Pain-A-AH

## 2024-04-12 NOTE — Telephone Encounter (Signed)
 Patient has appointment scheduled for tomorrow 04/13/24

## 2024-04-12 NOTE — Telephone Encounter (Unsigned)
 Copied from CRM #[id number]. Topic: Clinical - Medication Refill >> Apr 12, 2024  4:10 PM Zebedee SAUNDERS wrote: Medication:  Baclofen or Percosets  Has the patient contacted their pharmacy? Yes (Agent: If no, request that the patient contact the pharmacy for the refill. If patient does not wish to contact the pharmacy document the reason why and proceed with request.) (Agent: If yes, when and what did the pharmacy advise?)Pharmacy need script.   This is the patient's preferred pharmacy:  [facility] [id number] - [place], [place] - [address] [place] [address] Phone: [phone] Fax: [phone]  Is this the correct pharmacy for this prescription? Yes If no, delete pharmacy and type the correct one.   Has the prescription been filled recently? No  Is the patient out of the medication? Yes  Has the patient been seen for an appointment in the last year OR does the patient have an upcoming appointment? Yes  Can we respond through MyChart? Yes  Agent: Please be advised that Rx refills may take up to 3 business days. We ask that you follow-up with your pharmacy.

## 2024-04-12 NOTE — Telephone Encounter (Signed)
 Canbe video vist. She should log on at 8:30 a.m.

## 2024-04-13 ENCOUNTER — Ambulatory Visit: Admitting: Internal Medicine

## 2024-04-13 DIAGNOSIS — E876 Hypokalemia: Secondary | ICD-10-CM

## 2024-04-13 DIAGNOSIS — M79605 Pain in left leg: Secondary | ICD-10-CM

## 2024-04-13 MED ORDER — CEFADROXIL 500 MG PO CAPS: 500.0000 mg | ORAL_CAPSULE | Freq: Two times a day (BID) | ORAL | 0 refills | Status: AC

## 2024-04-13 MED ORDER — ACETAMINOPHEN-CODEINE 300-30 MG PO TABS: 1.0000 | ORAL_TABLET | Freq: Three times a day (TID) | ORAL | 0 refills | Status: AC | PRN

## 2024-04-13 NOTE — Telephone Encounter (Signed)
 Patient requesting baclofen and percocet, not on current med list. Patient had telehealth visit today with pcp.

## 2024-04-13 NOTE — Progress Notes (Signed)
 Patient ID: [name], female   DOB: [birth year], 53 y.o.   MRN: [id number] Virtual Visit via Video that was turned into telephone/audio only  I connected with [name] on 04/13/2024 at 8:32 AM by a video enabled telemedicine application.  However audio would not come in.  After 10 minutes of trying to help problem solve with her, we agreed to change to an audio/telephone only visit.  Patient informed that her insurance may or may not pay for a telephone visit.  She was given the opportunity for us  to cancel this appointment and give future appointment for in person office visit.  However she decided to proceed with the telephone visit today.  Location: Patient: home Provider: Office   I discussed the limitations of evaluation and management by telemedicine and the availability of in person appointments. The patient expressed understanding and agreed to proceed.  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  EGD and colonoscopy negative, chronic ulcer left lower extremity,    Discussed the use of AI scribe software for clinical note transcription with the patient, who gave verbal consent to proceed.  History of Present Illness [name] is a 53 year old female who presents with a painful open wound on her left lower leg. She is requesting rxn for Percocet or Hydrocodone .  She has a painful open wound on the front of her left lower leg near the calf, approximately the size of a quarter, which reopened after the Thanksgiving holiday. The wound is draining clear fluid and is associated with significant pain, making ambulation difficult. She is using a walker and a cane for mobility. She is  specifically requesting rxn for Percocet or Hydrocodone .  She was hospitalized from November 10th to 11th for cellulitis and abscess in the left leg. A Doppler ultrasound of the leg was negative for DVT. She was discharged with cefadroxil  500 mg to be taken twice daily for seven days, which she completed but found it did not significantly alleviate her pain symptoms.  She has been under the care of a wound care specialist, who saw her on twice since hosp discharge with last visit being December 4th.  According to the wound care specialist note, wound in the left groin and left lower leg were healing nicely.  I asked the patient what has changed since that last visit with the wound care specialist.  She reports increased pain and lack of healing since then. Endorses no redness at the sight, no fever. She is scheduled for a follow-up with the wound specialist on December 19th.  She is currently wearing compression stockings, which have not been effective in promoting healing. The [place] boot applied by the wound care specialist tends to slip down after two to three days, potentially affecting the healing process.  I received request for refill on potassium supplement from her pharmacy.  Upon recent hospital discharge, potassium supplement was discontinued.  She is on torsemide  20 mg 2 tablets daily and spironolactone  25 mg daily.  She had spoken to our caseworker for transition of care upon her recent hospital discharge and the question came up about the potassium supplement.  She was told to hold off on taking it and come to the lab for chemistry check.  She has not done so as yet.  Patient states she has not been taking the potassium supplement and is managing her potassium by eating more potassium rich foods like bananas.  Last potassium level on hospital discharge was 3.4.        Outpatient Encounter Medications as of 04/13/2024  Medication Sig Note   acetaminophen  (TYLENOL ) 325 MG tablet Take 2  tablets (650 mg total) by mouth every 6 (six) hours as needed for mild pain (pain score 1-3) or fever (or Fever >/= 101).    amLODipine  (NORVASC ) 2.5 MG tablet Take 1 tablet (2.5 mg total) by mouth daily.    apixaban  (ELIQUIS ) 5 MG TABS tablet Take 1 tablet (5 mg total) by mouth 2 (two) times daily.    atorvastatin  (LIPITOR) 40 MG tablet TAKE 1 TABLET BY MOUTH DAILY    cholecalciferol  (VITAMIN D3) 25 MCG (1000 UNIT) tablet Take 1 tablet (1,000 Units total) by mouth daily.    docusate sodium  (COLACE) 100 MG capsule Take 1 capsule (100 mg total) by mouth 2 (two) times daily as needed for mild constipation. (Patient taking differently: Take 100 mg by mouth daily.)    FEROSUL 325 (65 Fe) MG tablet TAKE ONE TABLET BY MOUTH DAILY WITH BREAKFAST    gabapentin  (NEURONTIN ) 400 MG capsule Take 1 capsule (400 mg total) by mouth 3 (three) times daily.    melatonin 3 MG TABS tablet Take 3 mg by mouth at bedtime.    methocarbamol  (ROBAXIN ) 500 MG tablet TAKE 1 TABLET BY MOUTH 2 TIMES A DAY AS NEEDED FOR MUSCLE SPASMS    mometasone -formoterol  (DULERA ) 200-5 MCG/ACT AERO INHALE 2 PUFFS EVERY MORNING AND 2 PUFFS EVERY NIGHT AT BEDTIME    nitroGLYCERIN  (NITROSTAT ) 0.4 MG SL tablet DISSOLVE 1 TABLET UNDER THE TONGUE AS NEEDED FOR CHEST PAIN EVERY 5 MINUTES UP TO 3 TIMES. IF NO RELIEF CALL 911. (Patient taking differently: Place 0.4 mg under the tongue every 5 (five) minutes as needed for chest pain.)    PARoxetine  (PAXIL ) 30 MG tablet Take 1 tablet (30 mg total) by mouth daily.    Potassium Chloride  ER 20 MEQ TBCR Take 1 tablet (20 mEq total) by mouth daily as needed (cramping). (Patient not taking: Reported on 03/10/2024)    Semaglutide , 2 MG/DOSE, (OZEMPIC , 2 MG/DOSE,) 8 MG/3ML SOPN DIAL AND INJECT 2 MG UNDER THE SKIN ONCE WEEKLY    spironolactone  (ALDACTONE ) 25 MG tablet Take 1 tablet (25 mg total) by mouth daily. 03/09/2024: No fill hx found. Pt is adamant he takes this medication.   torsemide  (DEMADEX ) 20 MG  tablet TAKE 2 TABLETS BY MOUTH DAILY    triamcinolone cream (KENALOG) 0.1 % Apply 1 Application topically daily as needed (skin irritation).    vitamin B-12 (CYANOCOBALAMIN ) 1000 MCG tablet Take 1,000 mcg by mouth daily.    No facility-administered encounter medications on file as of 04/13/2024.    Observations/Objective: Although patient was seen initially when she logged onto the video platform, most of this visit was done via telephone so I was not able to see her LT led  Assessment and Plan: 1. Leg pain, left (Primary) I am a little skeptical that patient is requesting Percocet or hydrocodone  primarily but seems less concern of the possibility of recurrent infection.  Also according to the wound specialist note when she was last seen on the fourth of this month, things seem to be healing nicely.  Will give the benefit of the doubt and give a small quantity of  Tylenol  with codeine  to use as needed for pain but will also give a 5-day course of antibiotics with it.  She confirms that she has taken Tylenol  with codeine  before without any adverse reaction. Advised to keep upcoming follow-up appointment with the wound care specialist on the 19th of this month. Be seen in the emergency room if anything gets worse before she is seen by the wound care specialist in 3 days. [place]  controlled substance reporting system reviewed. - acetaminophen -codeine  (TYLENOL  #3) 300-30 MG tablet; Take 1 tablet by mouth every 8 (eight) hours as needed for moderate pain (pain score 4-6).  Dispense: 15 tablet; Refill: 0 - cefadroxil  (DURICEF) 500 MG capsule; Take 1 capsule (500 mg total) by mouth 2 (two) times daily.  Dispense: 10 capsule; Refill: 0  2. Hypokalemia Advised patient to come to the lab sometime next week to have the repeat chemistry done first to check the potassium level.  She will continue to hold potassium supplement until then.   Follow Up Instructions: Pt to f/u in 1 mth for chronic ds  management   I discussed the assessment and treatment plan with the patient. The patient was provided an opportunity to ask questions and all were answered. The patient agreed with the plan and demonstrated an understanding of the instructions.   The patient was advised to call back or seek an in-person evaluation if the symptoms worsen or if the condition fails to improve as anticipated.  I spent 20 minutes dedicated to the care of this patient on the date of this encounter to include previsit review of chart including last hosp dischg note, wound care note, time discussing symptoms, dx and management and post visit entering of orders.   This note has been created with Education officer, environmental. Any transcriptional errors are unintentional.  Barnie Louder, MD

## 2024-04-13 NOTE — Telephone Encounter (Signed)
 Duplicate request, too soon for refill.  Requested Prescriptions  Pending Prescriptions Disp Refills   PARoxetine  (PAXIL ) 30 MG tablet 30 tablet 6    Sig: Take 1 tablet (30 mg total) by mouth daily.     Psychiatry:  Antidepressants - SSRI Passed - 04/13/2024  8:22 AM      Passed - Completed PHQ-2 or PHQ-9 in the last 360 days      Passed - Valid encounter within last 6 months    Recent Outpatient Visits           4 months ago Type 2 diabetes mellitus with morbid obesity (HCC)   [facility] - A Dept Of [place]. [facility] [name], MD   5 months ago Encounter for medication refill   [place] [facility] - A Dept Of [place]. [facility] Vicci Barnie B, MD   8 months ago Type 2 diabetes mellitus with morbid obesity [facility])   [place] [facility] - A Dept Of [place]. [facility] [name], [place]   8 months ago Rib contusion, left, sequela   [place] [facility] - A Dept Of [place]. [facility], Jon M, [place]   11 months ago Yeast infection   [place] [facility] [place] - A Dept Of [place]. [facility], [place], PA-C

## 2024-04-15 ENCOUNTER — Other Ambulatory Visit: Payer: Self-pay | Admitting: Internal Medicine

## 2024-04-16 ENCOUNTER — Encounter (HOSPITAL_BASED_OUTPATIENT_CLINIC_OR_DEPARTMENT_OTHER): Admitting: General Surgery

## 2024-04-16 DIAGNOSIS — E11622 Type 2 diabetes mellitus with other skin ulcer: Secondary | ICD-10-CM | POA: Diagnosis not present

## 2024-04-16 DIAGNOSIS — L97822 Non-pressure chronic ulcer of other part of left lower leg with fat layer exposed: Secondary | ICD-10-CM | POA: Diagnosis not present

## 2024-04-16 DIAGNOSIS — I872 Venous insufficiency (chronic) (peripheral): Secondary | ICD-10-CM | POA: Diagnosis not present

## 2024-04-20 ENCOUNTER — Encounter (HOSPITAL_BASED_OUTPATIENT_CLINIC_OR_DEPARTMENT_OTHER): Admitting: General Surgery

## 2024-04-20 DIAGNOSIS — E11622 Type 2 diabetes mellitus with other skin ulcer: Secondary | ICD-10-CM | POA: Diagnosis not present

## 2024-04-24 ENCOUNTER — Encounter: Payer: Self-pay | Admitting: Clinical

## 2024-04-24 NOTE — Telephone Encounter (Signed)
 Entered by error Elgie Crest, LCSW 04/24/2024, 2:10 PM

## 2024-04-28 ENCOUNTER — Encounter (HOSPITAL_BASED_OUTPATIENT_CLINIC_OR_DEPARTMENT_OTHER): Admitting: General Surgery

## 2024-04-28 DIAGNOSIS — E876 Hypokalemia: Secondary | ICD-10-CM

## 2024-04-28 DIAGNOSIS — E11622 Type 2 diabetes mellitus with other skin ulcer: Secondary | ICD-10-CM | POA: Diagnosis not present

## 2024-04-30 ENCOUNTER — Other Ambulatory Visit: Payer: Self-pay

## 2024-04-30 DIAGNOSIS — D500 Iron deficiency anemia secondary to blood loss (chronic): Secondary | ICD-10-CM

## 2024-05-03 ENCOUNTER — Inpatient Hospital Stay: Admitting: Hematology

## 2024-05-03 ENCOUNTER — Other Ambulatory Visit (HOSPITAL_COMMUNITY): Payer: Self-pay | Admitting: Hematology

## 2024-05-03 ENCOUNTER — Inpatient Hospital Stay: Attending: Hematology

## 2024-05-03 VITALS — BP 109/90 | HR 82 | Temp 98.0°F | Resp 16 | Ht 66.0 in | Wt 273.0 lb

## 2024-05-03 DIAGNOSIS — N926 Irregular menstruation, unspecified: Secondary | ICD-10-CM | POA: Diagnosis not present

## 2024-05-03 DIAGNOSIS — D500 Iron deficiency anemia secondary to blood loss (chronic): Secondary | ICD-10-CM | POA: Insufficient documentation

## 2024-05-03 DIAGNOSIS — E538 Deficiency of other specified B group vitamins: Secondary | ICD-10-CM | POA: Diagnosis not present

## 2024-05-03 DIAGNOSIS — Z87891 Personal history of nicotine dependence: Secondary | ICD-10-CM | POA: Diagnosis not present

## 2024-05-03 LAB — CBC WITH DIFFERENTIAL (CANCER CENTER ONLY)
Abs Immature Granulocytes: 0 K/uL (ref 0.00–0.07)
Basophils Absolute: 0 K/uL (ref 0.0–0.1)
Basophils Relative: 0 %
Eosinophils Absolute: 0.1 K/uL (ref 0.0–0.5)
Eosinophils Relative: 2 %
HCT: 35.7 % — ABNORMAL LOW (ref 36.0–46.0)
Hemoglobin: 10.4 g/dL — ABNORMAL LOW (ref 12.0–15.0)
Immature Granulocytes: 0 %
Lymphocytes Relative: 15 %
Lymphs Abs: 0.7 K/uL (ref 0.7–4.0)
MCH: 26.7 pg (ref 26.0–34.0)
MCHC: 29.1 g/dL — ABNORMAL LOW (ref 30.0–36.0)
MCV: 91.5 fL (ref 80.0–100.0)
Monocytes Absolute: 0.5 K/uL (ref 0.1–1.0)
Monocytes Relative: 10 %
Neutro Abs: 3.6 K/uL (ref 1.7–7.7)
Neutrophils Relative %: 73 %
Platelet Count: 143 K/uL — ABNORMAL LOW (ref 150–400)
RBC: 3.9 MIL/uL (ref 3.87–5.11)
RDW: 14.8 % (ref 11.5–15.5)
WBC Count: 4.9 K/uL (ref 4.0–10.5)
nRBC: 0 % (ref 0.0–0.2)

## 2024-05-03 LAB — CMP (CANCER CENTER ONLY)
ALT: 7 U/L (ref 0–44)
AST: 20 U/L (ref 15–41)
Albumin: 4.3 g/dL (ref 3.5–5.0)
Alkaline Phosphatase: 84 U/L (ref 38–126)
Anion gap: 11 (ref 5–15)
BUN: 8 mg/dL (ref 6–20)
CO2: 28 mmol/L (ref 22–32)
Calcium: 9.7 mg/dL (ref 8.9–10.3)
Chloride: 104 mmol/L (ref 98–111)
Creatinine: 0.6 mg/dL (ref 0.44–1.00)
GFR, Estimated: >60 mL/min
Glucose, Bld: 86 mg/dL (ref 70–99)
Potassium: 3.6 mmol/L (ref 3.5–5.1)
Sodium: 142 mmol/L (ref 135–145)
Total Bilirubin: 0.7 mg/dL (ref 0.0–1.2)
Total Protein: 8.3 g/dL — ABNORMAL HIGH (ref 6.5–8.1)

## 2024-05-03 LAB — IRON AND IRON BINDING CAPACITY (CC-WL,HP ONLY)
Iron: 38 ug/dL (ref 28–170)
Saturation Ratios: 10 % — ABNORMAL LOW (ref 10.4–31.8)
TIBC: 384 ug/dL (ref 250–450)
UIBC: 346 ug/dL

## 2024-05-03 LAB — FERRITIN: Ferritin: 35 ng/mL (ref 11–307)

## 2024-05-03 NOTE — Progress Notes (Signed)
 " HEMATOLOGY ONCOLOGY PROGRESS NOTE  Date of service: 05/03/2024  Patient Care Team: [name], MD as PCP - General (Internal Medicine) Anner Alm ORN, MD as PCP - Cardiology (Cardiology) Vicci Rollo SAUNDERS, PA-C as Physician Assistant (Cardiology)  CHIEF COMPLAINT/PURPOSE OF CONSULTATION: Follow-up for continued evaluation and management of iron deficiency anemia   HISTORY OF PRESENTING ILLNESS: [name] is a wonderful 54 y.o. female who has been referred to us  by Dr. Vicci for evaluation and management of anemia. The pt reports that she is doing well overall.    The pt reports that she has had anemia since birth but is unaware of any genetic condition causing her anemia. She received IV iron for about 4 years but stopped 2 years ago when she moved to [place]. Pt had a Port-a-Cath placed for repeat IV Iron Infusions due to issues accessing her veins.    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 She has been on anticoagulation for nearly 10 years and denies any repeat blood clots since starting Eliquis . Her PCP is currently managing her anticoagulation. 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 The ulcer tends to heal in the winter and reopen in the summer. It is currently healed with no open area.   Pt had a Colonoscopy and EGD at [facility] in [place] in August of 2021 that were benign. She was experiencing bloody stools frequently but hasn't had any after her 2021 GI studies. She has discontinued Aspirin due to concerns for excessive blood loss.    Pt has had a bilateral oophorectomies. She does not recall being diagnosed with PCOS. She denies gluten intolerance or any thyroid dysfunction. Pt had a fall in 2019 that is causing ongong back pain. She was seen after the fall and was found to have injury at L1 & L2. She has been given Tylenol   for pain management. Pt has a history of chronic acid suppression. She had stopped but recently restarted Prilosec. She was Vitamin B12 deficient for about one year. Pt had a heart attack in but denies any history of liver disease. Pt has had her COVID19 vaccines and booster.   Most recent lab results  of CBC w/diff is as follows: all values are WNL except for RBC at 3.80, Hgb at 10.1, HCT at 35.3, MCHC at 28.6, RDW at 16.2, PLT at 148K.   On review of systems, pt reports fatigue and denies nose bleeds, gum bleeds, bloody stools, heavy menstrual losses, fevers, unexpected weight loss, chills, new back pain, abdominal pain and any other symptoms.    On PMHx the pt reports B/L Oophorectomy, Myocardial infarction, DVT, Iron deficiency anemia due to chronic blood loss.   SUMMARY OF ONCOLOGIC HISTORY: Oncology History   No problem history exists.    INTERVAL HISTORY: [name] is a 54 y.o. female who is here today for continued evaluation and management of iron deficiency anemia .   she was last seen by me on 10/16/2023; at the time she mentioned experiencing intense leg pain and was unable to stand on it.   Today, she notes feeling fatigued and is experiencing headaches. She was given the narcotic prescription codeine  to ease the pain, which worked. However, now that she has used her whole prescription she is not allowed to fill it again since it is a narcotic.   She fell out of  bed recently and injured her hip in November 2025.   She is experiencing some oozing from her wound on the left leg. She describes the discharge as being pus with blood. The wound came up by itself and was not caused by a fall or scrape.   She notes that her periods are very irregular and when she does have her period it is very heavy. Her last period was March 27, 2024 and the cycle before that was in March of 2025.   She is taking B12 vitamins along with iron pills. She also notes constipation due to  the iron pills.   Denies blood in stools, leg swelling, or abdominal pain.  REVIEW OF SYSTEMS:   10 Point review of systems of done and is negative except as noted above.  MEDICAL HISTORY Past Medical History:  Diagnosis Date   (HFpEF) heart failure with preserved ejection fraction (HCC) -> although echo suggests normal diastolic parameters with normal left atrial size 04/13/2020   Allergy     Arthritis    Cellulitis    Clotting disorder 2024   COPD (chronic obstructive pulmonary disease) (HCC)    Coronary artery disease, non-occlusive    OBSTRUCTIVE: PT STATES - HAD A CARDIAC CATH - NOT TOLD SHE HAD CAD -> week note from [place]  indicates history of MI (patient cannot corroborate   Diabetes mellitus without complication (HCC)    DVT (deep venous thrombosis) (HCC) 09/17/2017   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   Morbid obesity due to excess calories (HCC)    OSA (obstructive sleep apnea) 07/11/2021   Sleep study April 06, 2019 (Dr. Shellia): Moderate OSA (AHI of 24.4 and SPO2 low of 77%).  Majority events during REM sleep.-recommend CPAP, oral appliance or surgical. Reviewed sleep hygiene.  Avoid sedatives.     Osteoarthritis of left knee    Prediabetes    Sleep apnea    Small bowel obstruction (HCC)    as a child   Speech impediment    Stutter / stammer   Stroke (HCC) 2007    SURGICAL HISTORY Past Surgical History:  Procedure Laterality Date   ABDOMINAL WALL DEFECT REPAIR  1970   EYE SURGERY     When i was born   IR CV LINE INJECTION  10/24/2020   IR REMOVAL TUN ACCESS W/ PORT W/O FL MOD SED  05/28/2021   IVC FILTER  INSERTION  2017   Lower Extremity Venous Duplex  06/23/2020   No evidence of DVT or superficial thrombosis bilaterally.  No evidence of deep venous insufficiency bilaterally.  No evidence of SSV reflux.  Right GSV in the calf has reflux, no reflux in L GSV.;  Repeated in July 2020-no DVT   OOPHORECTOMY  1996   OOPHORECTOMY  1997   PORTACATH PLACEMENT  2014   TRANSTHORACIC ECHOCARDIOGRAM  05/18/2021   EF 60 to 65%.  No RWMA.  Mild concentric LVH.  Normal diastolic parameters .  Normal longitudinal strain.  Normal PAP, RAP.  Normal aortic and mitral valves.==> In July 2022, echo read as GR 1 DD otherwise stable.    SOCIAL HISTORY Social  History[1]  Social History   Social History Narrative   Not on file    SOCIAL DRIVERS OF HEALTH SDOH Screenings   Food Insecurity: No Food Insecurity (04/07/2024)  Housing: Unknown (04/07/2024)  Transportation Needs: No Transportation Needs (04/07/2024)  Utilities: Not At Risk (03/24/2024)  Alcohol Screen: Low Risk (03/06/2023)  Depression (PHQ2-9): Medium Risk (03/24/2024)  Financial Resource Strain: Low Risk (04/07/2024)  Physical Activity: Insufficiently Active (04/07/2024)  Social Connections: Moderately Isolated (04/07/2024)  Stress: No Stress Concern Present (04/07/2024)  Tobacco Use: Medium Risk (03/26/2024)  Health Literacy: Inadequate Health Literacy (03/06/2023)     FAMILY HISTORY Family History  Problem Relation Age of Onset   Breast cancer Mother 63   Diabetes Mellitus II Mother    Diabetes Mother    COPD Father    Diabetes Father    Colon cancer Father    Diabetes Mellitus II Maternal Grandmother    Breast cancer Paternal Grandmother 36   Breast cancer Paternal Grandfather 73   Liver disease Neg Hx    Esophageal cancer Neg Hx      ALLERGIES: is allergic to ace inhibitors, hydromorphone, vancomycin, contrast media [iodinated contrast media], dilaudid [hydromorphone hcl], farxiga  [dapagliflozin ], and  lidocaine .  MEDICATIONS  Current Outpatient Medications  Medication Sig Dispense Refill   acetaminophen -codeine  (TYLENOL  #3) 300-30 MG tablet Take 1 tablet by mouth every 8 (eight) hours as needed for moderate pain (pain score 4-6). 15 tablet 0   amLODipine  (NORVASC ) 2.5 MG tablet Take 1 tablet (2.5 mg total) by mouth daily. 30 tablet 0   apixaban  (ELIQUIS ) 5 MG TABS tablet Take 1 tablet (5 mg total) by mouth 2 (two) times daily. 180 tablet 1   atorvastatin  (LIPITOR) 40 MG tablet TAKE 1 TABLET BY MOUTH DAILY 90 tablet 1   cefadroxil  (DURICEF) 500 MG capsule Take 1 capsule (500 mg total) by mouth 2 (two) times daily. 10 capsule 0   cholecalciferol  (VITAMIN D3) 25 MCG (1000 UNIT) tablet Take 1 tablet (1,000 Units total) by mouth daily. 30 tablet 5   docusate sodium  (COLACE) 100 MG capsule Take 1 capsule (100 mg total) by mouth 2 (two) times daily as needed for mild constipation. (Patient taking differently: Take 100 mg by mouth daily.)     FEROSUL 325 (65 Fe) MG tablet TAKE ONE TABLET BY MOUTH DAILY WITH BREAKFAST 100 tablet 0   gabapentin  (NEURONTIN ) 400 MG capsule Take 1 capsule (400 mg total) by mouth 3 (three) times daily. 90 capsule 0   melatonin 3 MG TABS tablet Take 3 mg by mouth at bedtime.     methocarbamol  (ROBAXIN ) 500 MG tablet TAKE 1 TABLET BY MOUTH 2 TIMES A DAY AS NEEDED FOR MUSCLE SPASMS 60 tablet 0   mometasone -formoterol  (DULERA ) 200-5 MCG/ACT AERO INHALE 2 PUFFS EVERY MORNING AND 2 PUFFS EVERY NIGHT AT BEDTIME 13 g 11   nitroGLYCERIN  (NITROSTAT ) 0.4 MG SL tablet DISSOLVE 1 TABLET UNDER THE TONGUE AS NEEDED FOR CHEST PAIN EVERY 5 MINUTES UP TO 3 TIMES. IF NO RELIEF CALL 911. (Patient taking differently: Place 0.4 mg under the tongue every 5 (five) minutes as needed for chest pain.) 25 tablet 2   PARoxetine  (PAXIL ) 30 MG tablet Take 1 tablet (30 mg total) by mouth daily. 30 tablet 6   Potassium Chloride  ER 20 MEQ TBCR Take 1 tablet (20 mEq total) by mouth daily as needed (cramping).  (Patient not taking: Reported on 03/10/2024) 60 tablet 1   Semaglutide , 2 MG/DOSE, (OZEMPIC , 2 MG/DOSE,) 8 MG/3ML SOPN  DIAL AND INJECT 2 MG UNDER THE SKIN ONCE WEEKLY 3 mL 2   spironolactone  (ALDACTONE ) 25 MG tablet Take 1 tablet (25 mg total) by mouth daily. 30 tablet 0   torsemide  (DEMADEX ) 20 MG tablet TAKE 2 TABLETS BY MOUTH DAILY 180 tablet 0   triamcinolone cream (KENALOG) 0.1 % Apply 1 Application topically daily as needed (skin irritation).     vitamin B-12 (CYANOCOBALAMIN ) 1000 MCG tablet Take 1,000 mcg by mouth daily.     No current facility-administered medications for this visit.    PHYSICAL EXAMINATION: ECOG PERFORMANCE STATUS: 0 - Asymptomatic VITALS: Vitals:   05/03/24 1245 05/03/24 1251  BP: (!) 91/57 (!) 109/90  Pulse: 93 82  Resp: 16   Temp: 98 F (36.7 C)   SpO2: 100% 100%   Filed Weights   05/03/24 1245  Weight: 273 lb (123.8 kg)   Body mass index is 44.06 kg/m.  GENERAL: alert, in no acute distress and comfortable SKIN: no acute rashes, no significant lesions EYES: conjunctiva are pink and non-injected, sclera anicteric OROPHARYNX: MMM, no exudates, no oropharyngeal erythema or ulceration NECK: supple, no JVD LYMPH:  no palpable lymphadenopathy in the cervical, axillary or inguinal regions LUNGS: clear to auscultation b/l with normal respiratory effort HEART: regular rate & rhythm ABDOMEN:  normoactive bowel sounds , non tender, not distended, no hepatosplenomegaly Extremity: no pedal edema PSYCH: alert & oriented x 3 with fluent speech NEURO: no focal motor/sensory deficits  LABORATORY DATA:   I have reviewed the data as listed     Latest Ref Rng & Units 05/03/2024   12:26 PM 03/09/2024    3:51 AM 03/08/2024    6:28 PM  CBC EXTENDED  WBC 4.0 - 10.5 K/uL 4.9  4.8  4.9   RBC 3.87 - 5.11 MIL/uL 3.90  3.34  3.55   Hemoglobin 12.0 - 15.0 g/dL 89.5  9.1  9.9   HCT 63.9 - 46.0 % 35.7  31.6  33.4   Platelets 150 - 400 K/uL 143  158  152   NEUT# 1.7  - 7.7 K/uL 3.6  3.6  3.5   Lymph# 0.7 - 4.0 K/uL 0.7  0.6  0.7         Latest Ref Rng & Units 03/09/2024    3:51 AM 03/08/2024    6:28 PM 10/16/2023    1:38 PM  CMP  Glucose 70 - 99 mg/dL 890  93  86   BUN 6 - 20 mg/dL 7  9  12    Creatinine 0.44 - 1.00 mg/dL 9.49  9.44  9.26   Sodium 135 - 145 mmol/L 139  140  140   Potassium 3.5 - 5.1 mmol/L 3.4  3.5  3.7   Chloride 98 - 111 mmol/L 100  101  104   CO2 22 - 32 mmol/L 30  30  29    Calcium  8.9 - 10.3 mg/dL 9.1  9.5  9.6   Total Protein 6.5 - 8.1 g/dL 6.9  7.4  8.0   Total Bilirubin 0.0 - 1.2 mg/dL 0.5  0.5  0.8   Alkaline Phos 38 - 126 U/L 72  84  82   AST 15 - 41 U/L 14  14  10    ALT 0 - 44 U/L <5  <5  5    Iron and Iron Binding Capacity Component     Latest Ref Rng 05/03/2024  Iron     28 - 170 ug/dL 38   TIBC  250 - 450 ug/dL 615   Saturation Ratios     10.4 - 31.8 % 10 (L)   UIBC     ug/dL 653     Legend: (L) Low  Ferritin Component     Latest Ref Rng 05/03/2024  Ferritin     11 - 307 ng/mL 35      RADIOGRAPHIC STUDIES: I have personally reviewed the radiological images as listed and agreed with the findings in the report. CT Thoracic Spine Wo Contrast Result Date: 03/26/2024 EXAM: CT THORACIC SPINE WITHOUT CONTRAST 03/26/2024 10:14:11 AM TECHNIQUE: CT of the thoracic spine was performed without the administration of intravenous contrast. Multiplanar reformatted images are provided for review. Automated exposure control, iterative reconstruction, and/or weight based adjustment of the mA/kV was utilized to reduce the radiation dose to as low as reasonably achievable. COMPARISON: None available. CLINICAL HISTORY: Back trauma, no prior imaging (Age >= 16y); tenderness to upper thoracic spine after fall. FINDINGS: BONES AND ALIGNMENT: Normal vertebral body heights. No acute fracture or suspicious bone lesion. Normal alignment. DEGENERATIVE CHANGES: Mild diffuse chronic degenerative disc disease present throughout the  thoracic spine, manifested by disc space narrowing and mild endplate ridging. There are calcifications present within the posterior longitudinal ligaments at several levels, causing mild central spinal canal stenosis. There is no apparent impingement of the spinal cord or exiting nerve roots. 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 This is most commonly seen with occlusion of the inferior vena cava. IMPRESSION: 1. No acute abnormality of the thoracic spine related to the reported trauma. 2. 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 No impingement of the spinal cord or exiting nerve roots. 3. 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 Electronically signed by: Evalene Coho MD 03/26/2024 11:06 AM EST RP Workstation: [id number]   CT Head Wo Contrast Result Date: 03/26/2024 CLINICAL DATA:  Patient fell while walking her dog. EXAM: CT HEAD WITHOUT CONTRAST CT MAXILLOFACIAL WITHOUT CONTRAST CT CERVICAL SPINE WITHOUT CONTRAST TECHNIQUE: Multidetector CT imaging of the head, cervical spine, and maxillofacial structures were performed using the standard protocol without intravenous contrast. Multiplanar CT image reconstructions of the cervical spine and maxillofacial structures were also generated. RADIATION DOSE REDUCTION: This exam was performed according to the departmental dose-optimization program which includes automated exposure control, adjustment of the mA and/or kV according to patient size and/or use of iterative reconstruction technique. COMPARISON:  11/25/2022. FINDINGS: CT HEAD FINDINGS Brain: No evidence of acute infarction, hemorrhage, hydrocephalus, extra-axial collection or mass lesion/mass effect. Vascular: No hyperdense vessel or  unexpected calcification. Skull: Normal. Negative for fracture or focal lesion. Other: None. CT MAXILLOFACIAL FINDINGS Osseous: No fracture or mandibular dislocation. No destructive process. Orbits: Mild right preseptal periorbital soft tissue edema/contusion. No injury to the right globe or postseptal orbit. Normal left globe and orbit. Sinuses: Clear. Soft tissues: No other soft tissue abnormality. CT CERVICAL SPINE FINDINGS Alignment: Mild kyphosis, apex at C4, likely positional. No spondylolisthesis. Skull base and vertebrae: No acute fracture. No primary bone lesion or focal pathologic process. Soft tissues and spinal canal: No prevertebral fluid or swelling. No visible canal hematoma. Disc levels: Moderate loss of disc height throughout the cervical spine with endplate spurring and mild endplate sclerosis. Mild disc bulging. No convincing disc herniation. Upper chest: Unremarkable. Other: None. IMPRESSION: HEAD CT 1.  No acute intracranial abnormalities. MAXILLOFACIAL CT 1. No fracture. 2. Mild right preseptal periorbital soft tissue swelling/contusion. CERVICAL CT 1. No fracture or acute finding. Electronically Signed   By: Alm Parkins M.D.   On: 03/26/2024 10:22   CT Maxillofacial Wo Contrast Result Date: 03/26/2024 CLINICAL DATA:  Patient fell while walking her dog. EXAM: CT HEAD WITHOUT CONTRAST CT MAXILLOFACIAL WITHOUT CONTRAST CT CERVICAL SPINE WITHOUT CONTRAST TECHNIQUE: Multidetector CT imaging of the head, cervical spine, and maxillofacial structures were performed using the standard protocol without intravenous contrast. Multiplanar CT image reconstructions of the cervical spine and maxillofacial structures were also generated. RADIATION DOSE REDUCTION: This exam was performed according to the departmental dose-optimization program which includes automated exposure control, adjustment of the mA and/or kV according to patient size and/or use of iterative reconstruction technique. COMPARISON:   11/25/2022. FINDINGS: CT HEAD FINDINGS Brain: No evidence of acute infarction, hemorrhage, hydrocephalus, extra-axial collection or mass lesion/mass effect. Vascular: No hyperdense vessel or unexpected calcification. Skull: Normal. Negative for fracture or focal lesion. Other: None. CT MAXILLOFACIAL FINDINGS Osseous: No fracture or mandibular dislocation. No destructive process. Orbits: Mild right preseptal periorbital soft tissue edema/contusion. No injury to the right globe or postseptal orbit. Normal left globe and orbit. Sinuses: Clear. Soft tissues: No other soft tissue abnormality. CT CERVICAL SPINE FINDINGS Alignment: Mild kyphosis, apex at C4, likely positional. No spondylolisthesis. Skull base and vertebrae: No acute fracture. No primary bone lesion or focal pathologic process. Soft tissues and spinal canal: No prevertebral fluid or swelling. No visible canal hematoma. Disc levels: Moderate loss of disc height throughout the cervical spine with endplate spurring and mild endplate sclerosis. Mild disc bulging. No convincing disc herniation. Upper chest: Unremarkable. Other: None. IMPRESSION: HEAD CT 1. No acute intracranial abnormalities. MAXILLOFACIAL CT 1. No fracture. 2. Mild right preseptal periorbital soft tissue swelling/contusion. CERVICAL CT 1. No fracture or acute finding. Electronically Signed   By: Alm Parkins M.D.   On: 03/26/2024 10:22   CT Cervical Spine Wo Contrast Result Date: 03/26/2024 CLINICAL DATA:  Patient fell while walking her dog. EXAM: CT HEAD WITHOUT CONTRAST CT MAXILLOFACIAL WITHOUT CONTRAST CT CERVICAL SPINE WITHOUT CONTRAST TECHNIQUE: Multidetector CT imaging of the head, cervical spine, and maxillofacial structures were performed using the standard protocol without intravenous contrast. Multiplanar CT image reconstructions of the cervical spine and maxillofacial structures were also generated. RADIATION DOSE REDUCTION: This exam was performed according to the departmental  dose-optimization program which includes automated exposure control, adjustment of the mA and/or kV according to patient size and/or use of iterative reconstruction technique. COMPARISON:  11/25/2022. FINDINGS: CT HEAD FINDINGS Brain: No evidence of acute infarction, hemorrhage, hydrocephalus, extra-axial collection or mass lesion/mass effect. Vascular: No hyperdense vessel or unexpected calcification. Skull: Normal. Negative for fracture or focal lesion. Other: None. CT MAXILLOFACIAL FINDINGS Osseous: No fracture or mandibular dislocation. No destructive process. Orbits: Mild right preseptal periorbital soft tissue edema/contusion. No injury to the right globe or postseptal orbit. Normal left globe and orbit. Sinuses: Clear. Soft tissues: No other soft tissue abnormality. CT CERVICAL SPINE FINDINGS Alignment: Mild kyphosis, apex at C4, likely positional. No spondylolisthesis. Skull base and vertebrae: No acute fracture. No primary bone lesion or focal pathologic process. Soft tissues and spinal canal: No prevertebral fluid or swelling. No visible canal hematoma. Disc levels: Moderate loss of disc height throughout the cervical spine with endplate spurring and mild endplate sclerosis. Mild disc bulging. No convincing disc herniation. Upper chest: Unremarkable. Other: None. IMPRESSION: HEAD CT 1. No  acute intracranial abnormalities. MAXILLOFACIAL CT 1. No fracture. 2. Mild right preseptal periorbital soft tissue swelling/contusion. CERVICAL CT 1. No fracture or acute finding. Electronically Signed   By: Alm Parkins M.D.   On: 03/26/2024 10:22   VAS US  LOWER EXTREMITY VENOUS (DVT) Result Date: 03/09/2024  Lower Venous DVT Study Patient Name:  [name]  Date of Exam:   03/09/2024 Medical Rec #: [id number]                Accession #:    [id number] Date of Birth: March 09, 1971                 Patient Gender: F Patient Age:   89 years Exam Location:  [facility] Procedure:      VAS US  LOWER  EXTREMITY VENOUS (DVT) Referring Phys: REDIA CLEAVER --------------------------------------------------------------------------------  Indications: Swelling, Pain, and Left lower leg chronic wound.  Risk Factors: DVT History in 08/2017 obesity and PVD, venous insufficiency. Limitations: Body habitus. Comparison Study: 12/10/22 - Negative Performing Technologist: Ricka Sturdivant-Jones RDMS, RVT  Examination Guidelines: A complete evaluation includes B-mode imaging, spectral Doppler, color Doppler, and power Doppler as needed of all accessible portions of each vessel. Bilateral testing is considered an integral part of a complete examination. Limited examinations for reoccurring indications may be performed as noted. The reflux portion of the exam is performed with the patient in reverse Trendelenburg.  +---------+---------------+---------+-----------+----------+-------------------+ RIGHT    CompressibilityPhasicitySpontaneityPropertiesThrombus Aging      +---------+---------------+---------+-----------+----------+-------------------+ CFV      Full           Yes      Yes                                      +---------+---------------+---------+-----------+----------+-------------------+ SFJ      Full                                                             +---------+---------------+---------+-----------+----------+-------------------+ FV Prox  Full                                                             +---------+---------------+---------+-----------+----------+-------------------+ FV Mid   Full           Yes      Yes                                      +---------+---------------+---------+-----------+----------+-------------------+ FV DistalFull           Yes      Yes                                      +---------+---------------+---------+-----------+----------+-------------------+ PFV      Full                                                              +---------+---------------+---------+-----------+----------+-------------------+  POP      Full           No       Yes                                      +---------+---------------+---------+-----------+----------+-------------------+ PTV      Full                                                             +---------+---------------+---------+-----------+----------+-------------------+ PERO                                                  Appears patent with                                                       color               +---------+---------------+---------+-----------+----------+-------------------+   +---------+---------------+---------+-----------+----------+-----------------+ LEFT     CompressibilityPhasicitySpontaneityPropertiesThrombus Aging    +---------+---------------+---------+-----------+----------+-----------------+ CFV      Full           Yes      Yes                                    +---------+---------------+---------+-----------+----------+-----------------+ SFJ      Full                                                           +---------+---------------+---------+-----------+----------+-----------------+ FV Prox  Full                                                           +---------+---------------+---------+-----------+----------+-----------------+ FV Mid   Full           Yes      Yes                                    +---------+---------------+---------+-----------+----------+-----------------+ FV Distal               Yes      Yes                  Patent with color +---------+---------------+---------+-----------+----------+-----------------+ PFV      Full                                                           +---------+---------------+---------+-----------+----------+-----------------+  POP      Full           Yes      Yes                                     +---------+---------------+---------+-----------+----------+-----------------+ PTV      Full                                                           +---------+---------------+---------+-----------+----------+-----------------+ PERO     Full                                                           +---------+---------------+---------+-----------+----------+-----------------+     Summary: BILATERAL: - No evidence of deep vein thrombosis seen in the lower extremities, bilaterally. -No evidence of popliteal cyst, bilaterally.   *See table(s) above for measurements and observations. Electronically signed by Penne Colorado MD on 03/09/2024 at 5:05:55 PM.    Final    DG Shoulder Left Result Date: 03/08/2024 CLINICAL DATA:  Fall EXAM: LEFT SHOULDER - 2+ VIEW COMPARISON:  None. FINDINGS: There is no acute fracture or dislocation. There are soft tissue calcifications adjacent to the greater tuberosity likely related to calcific tendinitis. There are minimal degenerative changes of the acromioclavicular joint. IMPRESSION: 1. No acute fracture or dislocation. 2. Calcific tendinitis. Electronically Signed   By: Greig Pique M.D.   On: 03/08/2024 18:43   DG Shoulder Right Result Date: 03/08/2024 CLINICAL DATA:  Fall EXAM: RIGHT SHOULDER - 2+ VIEW COMPARISON:  None Available. FINDINGS: There is no acute fracture or dislocation. There are mild-to-moderate degenerative changes of the acromioclavicular joint with joint space narrowing and osteophyte formation. There is a small spur off the superolateral aspect of the acromion. Soft tissues are within normal limits. IMPRESSION: 1. No acute fracture or dislocation. 2. Mild-to-moderate degenerative changes of the acromioclavicular joint. Electronically Signed   By: Greig Pique M.D.   On: 03/08/2024 18:42    ASSESSMENT & PLAN:  54 y.o. female with  54 y.o. with    1) Chronic iron deficiency anemia requiring recurrent IV Iron infusions. Likely from  chronic GI losses and previously due to blood loss from venous stasis ulcer in the setting of chronic anticoagulation. 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     Patient Active Problem List    Diagnosis Date Noted   Abdominal pain in female patient 06/24/2023   Peripheral edema 01/16/2023   Chronic diastolic CHF (congestive heart failure) (HCC) 01/16/2023   Lack of motivation 01/08/2023   Anasarca 01/07/2023   Orthostatic dizziness 11/27/2022   Vertigo 11/26/2022   Type 2 diabetes mellitus with morbid obesity (HCC) 09/18/2022   Moderate major depression (HCC) 05/07/2022   Hyperlipidemia with target LDL less than 100 02/17/2022   Pain in thoracic spine 09/25/2021   Low back pain 09/25/2021   OSA (obstructive sleep apnea) 07/11/2021   Depression 05/28/2021  Morbid obesity with BMI of 50.0-59.9, adult (HCC) 05/26/2021   Bilateral lower extremity edema 11/22/2020   History of DVT (deep vein thrombosis) 11/22/2020   Asthma, chronic, moderate persistent, with acute exacerbation 11/22/2020   Lymphedema 06/09/2020   Iron deficiency anemia due to chronic blood loss 05/10/2020   Venous insufficiency 04/13/2020   S/P insertion of IVC (inferior vena caval) filter 04/13/2020   Chronic anemia 04/13/2020   Essential hypertension 04/13/2020   (HFpEF) heart failure with preserved ejection fraction (HCC) -> although echo suggests normal diastolic parameters with normal left atrial size 04/13/2020   CAD (coronary artery disease) 11/17/2019    PLAN: - Discussed lab results on 05/03/2024 in detail with patient: - CMP   - Creatinine:  0.60 - Calcium : 9.7  - CBC  - Hemoglobin: 10.4  - WBC: 4.9  - Platelets: 143 - Iron and Iron Binding  - Iron: 38  - Sat Ratio: 10 - Ferritin: 35 - IV Monoferric 1000 mg x 1 dose that [employer]. Infusion - Recommend that  patient speaks with PCP or Gynecology about her irregular periods  FOLLOW-UP in 4 months for labs and follow-up with Dr. Onesimo.  The total time spent in the appointment was 21 minutes* .  All of the patient's questions were answered and the patient knows to call the clinic with any problems, questions, or concerns.  Emaline Onesimo MD MS AAHIVMS [facility] Hematology/Oncology Physician [facility]  *Total Encounter Time as defined by the Centers for Medicare and Medicaid Services includes, in addition to the face-to-face time of a patient visit (documented in the note above) non-face-to-face time: obtaining and reviewing outside history, ordering and reviewing medications, tests or procedures, care coordination (communications with other health care professionals or caregivers) and documentation in the medical record.  I, Marijo Sharps, acting as a neurosurgeon for Emaline Onesimo, MD.,have documented all relevant documentation on the behalf of Emaline Onesimo, MD,as directed by  Emaline Onesimo, MD while in the presence of Emaline Onesimo, MD.  I have reviewed the above documentation for accuracy and completeness, and I agree with the above.  Emaline Onesimo, MD      [1]  Social History Tobacco Use   Smoking status: Former    Current packs/day: 0.00    Average packs/day: 0.3 packs/day for 18.0 years (4.5 ttl pk-yrs)    Types: Cigarettes    Start date: 04/29/1984    Quit date: 04/29/2002    Years since quitting: 22.0   Smokeless tobacco: Never  Vaping Use   Vaping status: Never Used  Substance Use Topics   Alcohol use: Not Currently   Drug use: Not Currently   "

## 2024-05-04 ENCOUNTER — Telehealth (HOSPITAL_COMMUNITY): Payer: Self-pay | Admitting: Pharmacy Technician

## 2024-05-04 NOTE — Telephone Encounter (Signed)
 Auth Submission: NO AUTH NEEDED Site of care: [facility] Payer: [employer]ICA[employer] Medication & CPT/J Code(s) submitted: Monoferric (Ferric derisomaltose) [id number] Diagnosis Code: D50.0 Route of submission (phone, fax, portal):  Phone # Fax # Auth type: Buy/Bill HB Units/visits requested: 1000MG  X 1 DOSE Reference number:  Approval from: 05/04/2024 to 07/02/24    Dagoberto Armour, CPhT [facility] Phone: [phone] 05/04/2024

## 2024-05-05 ENCOUNTER — Encounter (HOSPITAL_BASED_OUTPATIENT_CLINIC_OR_DEPARTMENT_OTHER): Attending: General Surgery | Admitting: General Surgery

## 2024-05-05 DIAGNOSIS — I872 Venous insufficiency (chronic) (peripheral): Secondary | ICD-10-CM | POA: Insufficient documentation

## 2024-05-05 DIAGNOSIS — E11622 Type 2 diabetes mellitus with other skin ulcer: Secondary | ICD-10-CM | POA: Diagnosis present

## 2024-05-05 DIAGNOSIS — L97822 Non-pressure chronic ulcer of other part of left lower leg with fat layer exposed: Secondary | ICD-10-CM | POA: Insufficient documentation

## 2024-05-05 DIAGNOSIS — I5030 Unspecified diastolic (congestive) heart failure: Secondary | ICD-10-CM | POA: Insufficient documentation

## 2024-05-05 DIAGNOSIS — I890 Lymphedema, not elsewhere classified: Secondary | ICD-10-CM | POA: Diagnosis not present

## 2024-05-06 ENCOUNTER — Ambulatory Visit (HOSPITAL_COMMUNITY): Admitting: Clinical

## 2024-05-09 ENCOUNTER — Encounter (HOSPITAL_COMMUNITY): Payer: Self-pay | Admitting: Hematology

## 2024-05-10 ENCOUNTER — Other Ambulatory Visit: Payer: Self-pay | Admitting: *Deleted

## 2024-05-10 NOTE — Patient Instructions (Signed)
 Mckinnley Damaris Rouse - I am sorry  you where unable to complete outreach today.  I have rescheduled you for 06/09/24 @ 1130 am.  I work with Vicci, Barnie NOVAK, MD and am calling to support your healthcare needs. Please contact me at [phone] at your earliest convenience. I look forward to speaking with you soon.   Thank you,  Xavyer Steenson, RN, BSN, ACM RN Care Manager [employer] [phone]

## 2024-05-12 ENCOUNTER — Encounter (HOSPITAL_BASED_OUTPATIENT_CLINIC_OR_DEPARTMENT_OTHER): Admitting: General Surgery

## 2024-05-12 DIAGNOSIS — E11622 Type 2 diabetes mellitus with other skin ulcer: Secondary | ICD-10-CM | POA: Diagnosis not present

## 2024-05-19 ENCOUNTER — Encounter (HOSPITAL_BASED_OUTPATIENT_CLINIC_OR_DEPARTMENT_OTHER): Admitting: General Surgery

## 2024-05-19 DIAGNOSIS — E11622 Type 2 diabetes mellitus with other skin ulcer: Secondary | ICD-10-CM | POA: Diagnosis not present

## 2024-05-20 ENCOUNTER — Ambulatory Visit (INDEPENDENT_AMBULATORY_CARE_PROVIDER_SITE_OTHER): Admitting: Clinical

## 2024-05-20 ENCOUNTER — Other Ambulatory Visit: Payer: Self-pay | Admitting: Internal Medicine

## 2024-05-20 ENCOUNTER — Ambulatory Visit: Payer: Self-pay | Admitting: Internal Medicine

## 2024-05-20 ENCOUNTER — Other Ambulatory Visit: Payer: Self-pay | Admitting: Pharmacist

## 2024-05-20 DIAGNOSIS — F419 Anxiety disorder, unspecified: Secondary | ICD-10-CM | POA: Diagnosis not present

## 2024-05-20 DIAGNOSIS — F330 Major depressive disorder, recurrent, mild: Secondary | ICD-10-CM | POA: Diagnosis not present

## 2024-05-20 MED ORDER — AMLODIPINE BESYLATE 2.5 MG PO TABS: 2.5000 mg | ORAL_TABLET | Freq: Every day | ORAL | 0 refills | Status: AC
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
## 2024-05-20 NOTE — Telephone Encounter (Signed)
 Requested medication (s) are due for refill today: Yes  Requested medication (s) are on the active medication list: Yes  Last refill:  03/09/24  Future visit scheduled: Yes  Notes to clinic:  Unable to refill per protocol, last refill by another provider.      Requested Prescriptions  Pending Prescriptions Disp Refills   amLODipine  (NORVASC ) 2.5 MG tablet [Pharmacy Med Name: AMLODIPINE  BESYLATE 2.5 MG TAB] 90 tablet     Sig: TAKE 1 TABLET BY MOUTH DAILY     Cardiovascular: Calcium  Channel Blockers 2 Failed - 05/20/2024  5:20 PM      Failed - Last BP in normal range    BP Readings from Last 1 Encounters:  05/03/24 (!) 109/90         Passed - Last Heart Rate in normal range    Pulse Readings from Last 1 Encounters:  05/03/24 82         Passed - Valid encounter within last 6 months    Recent Outpatient Visits           1 month ago Leg pain, left   [facility] [place]. [facility] Vicci Sober B, MD   5 months ago Type 2 diabetes mellitus with morbid obesity [facility]   [facility] [place]. [facility] Vicci Sober NOVAK, MD   6 months ago Encounter for medication refill   [facility] [place]. [facility] Vicci Sober B, MD   9 months ago Type 2 diabetes mellitus with morbid obesity [facility])   [facility] [place]. [facility] Vicci Sober NOVAK, MD   10 months ago Rib contusion, left, sequela   [facility] [place]. [facility], [place], PA-C

## 2024-05-22 ENCOUNTER — Encounter (HOSPITAL_COMMUNITY): Payer: Self-pay | Admitting: Clinical
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
## 2024-05-24 ENCOUNTER — Encounter (HOSPITAL_COMMUNITY)

## 2024-05-26 ENCOUNTER — Encounter (HOSPITAL_BASED_OUTPATIENT_CLINIC_OR_DEPARTMENT_OTHER): Admitting: General Surgery

## 2024-05-26 DIAGNOSIS — E11622 Type 2 diabetes mellitus with other skin ulcer: Secondary | ICD-10-CM | POA: Diagnosis not present

## 2024-05-27 ENCOUNTER — Emergency Department (HOSPITAL_BASED_OUTPATIENT_CLINIC_OR_DEPARTMENT_OTHER)

## 2024-05-27 ENCOUNTER — Other Ambulatory Visit: Payer: Self-pay

## 2024-05-27 ENCOUNTER — Ambulatory Visit: Payer: Self-pay

## 2024-05-27 ENCOUNTER — Encounter (HOSPITAL_BASED_OUTPATIENT_CLINIC_OR_DEPARTMENT_OTHER): Payer: Self-pay | Admitting: Emergency Medicine

## 2024-05-27 ENCOUNTER — Emergency Department (HOSPITAL_BASED_OUTPATIENT_CLINIC_OR_DEPARTMENT_OTHER)
Admission: EM | Admit: 2024-05-27 | Discharge: 2024-05-27 | Disposition: A | Discharge status: Home / Self Care | Attending: Emergency Medicine | Admitting: Emergency Medicine

## 2024-05-27 DIAGNOSIS — Z79899 Other long term (current) drug therapy: Secondary | ICD-10-CM | POA: Insufficient documentation

## 2024-05-27 DIAGNOSIS — R1084 Generalized abdominal pain: Secondary | ICD-10-CM | POA: Diagnosis not present

## 2024-05-27 DIAGNOSIS — Z7901 Long term (current) use of anticoagulants: Secondary | ICD-10-CM | POA: Diagnosis not present

## 2024-05-27 DIAGNOSIS — I110 Hypertensive heart disease with heart failure: Secondary | ICD-10-CM | POA: Insufficient documentation

## 2024-05-27 DIAGNOSIS — R5383 Other fatigue: Secondary | ICD-10-CM | POA: Diagnosis present

## 2024-05-27 DIAGNOSIS — D509 Iron deficiency anemia, unspecified: Secondary | ICD-10-CM | POA: Insufficient documentation

## 2024-05-27 DIAGNOSIS — I5030 Unspecified diastolic (congestive) heart failure: Secondary | ICD-10-CM | POA: Diagnosis not present

## 2024-05-27 LAB — URINALYSIS, ROUTINE W REFLEX MICROSCOPIC
Bilirubin Urine: NEGATIVE
Glucose, UA: NEGATIVE mg/dL
Hgb urine dipstick: NEGATIVE
Ketones, ur: NEGATIVE mg/dL
Leukocytes,Ua: NEGATIVE
Nitrite: NEGATIVE
Protein, ur: NEGATIVE mg/dL
Specific Gravity, Urine: 1.026 (ref 1.005–1.030)
pH: 6 (ref 5.0–8.0)

## 2024-05-27 LAB — CBC WITH DIFFERENTIAL/PLATELET
Abs Immature Granulocytes: 0.01 10*3/uL (ref 0.00–0.07)
Basophils Absolute: 0 10*3/uL (ref 0.0–0.1)
Basophils Relative: 0 %
Eosinophils Absolute: 0.1 10*3/uL (ref 0.0–0.5)
Eosinophils Relative: 1 %
HCT: 33.2 % — ABNORMAL LOW (ref 36.0–46.0)
Hemoglobin: 9.8 g/dL — ABNORMAL LOW (ref 12.0–15.0)
Immature Granulocytes: 0 %
Lymphocytes Relative: 16 %
Lymphs Abs: 0.7 10*3/uL (ref 0.7–4.0)
MCH: 26.5 pg (ref 26.0–34.0)
MCHC: 29.5 g/dL — ABNORMAL LOW (ref 30.0–36.0)
MCV: 89.7 fL (ref 80.0–100.0)
Monocytes Absolute: 0.4 10*3/uL (ref 0.1–1.0)
Monocytes Relative: 9 %
Neutro Abs: 3.3 10*3/uL (ref 1.7–7.7)
Neutrophils Relative %: 74 %
Platelets: 122 10*3/uL — ABNORMAL LOW (ref 150–400)
RBC: 3.7 MIL/uL — ABNORMAL LOW (ref 3.87–5.11)
RDW: 13.8 % (ref 11.5–15.5)
WBC: 4.5 10*3/uL (ref 4.0–10.5)
nRBC: 0 % (ref 0.0–0.2)

## 2024-05-27 LAB — HEPATIC FUNCTION PANEL
ALT: 7 U/L (ref 0–44)
AST: 17 U/L (ref 15–41)
Albumin: 3.9 g/dL (ref 3.5–5.0)
Alkaline Phosphatase: 78 U/L (ref 38–126)
Bilirubin, Direct: 0.3 mg/dL — ABNORMAL HIGH (ref 0.0–0.2)
Indirect Bilirubin: 0.5 mg/dL (ref 0.3–0.9)
Total Bilirubin: 0.8 mg/dL (ref 0.0–1.2)
Total Protein: 7.8 g/dL (ref 6.5–8.1)

## 2024-05-27 LAB — BASIC METABOLIC PANEL WITH GFR
Anion gap: 9 (ref 5–15)
BUN: 12 mg/dL (ref 6–20)
CO2: 29 mmol/L (ref 22–32)
Calcium: 9.6 mg/dL (ref 8.9–10.3)
Chloride: 102 mmol/L (ref 98–111)
Creatinine, Ser: 0.65 mg/dL (ref 0.44–1.00)
GFR, Estimated: >60 mL/min
Glucose, Bld: 76 mg/dL (ref 70–99)
Potassium: 3.5 mmol/L (ref 3.5–5.1)
Sodium: 140 mmol/L (ref 135–145)

## 2024-05-27 LAB — LIPASE, BLOOD: Lipase: 43 U/L (ref 11–51)

## 2024-05-27 MED ORDER — OXYCODONE-ACETAMINOPHEN 5-325 MG PO TABS
1.0000 | ORAL_TABLET | Freq: Once | ORAL | Status: AC
  Administered 2024-05-27: 1 via ORAL
  Filled 2024-05-27: qty 1

## 2024-05-27 MED ORDER — ONDANSETRON 4 MG PO TBDP
4.0000 mg | ORAL_TABLET | Freq: Once | ORAL | Status: AC
  Administered 2024-05-27: 4 mg via ORAL
  Filled 2024-05-27: qty 1

## 2024-05-27 MED ORDER — MORPHINE SULFATE (PF) 4 MG/ML IV SOLN
4.0000 mg | Freq: Once | INTRAVENOUS | Status: AC
  Administered 2024-05-27: 4 mg via INTRAMUSCULAR
  Filled 2024-05-27: qty 1

## 2024-05-27 NOTE — ED Notes (Signed)
 Patient unable to establish a ride home, [employer] used per press photographer.

## 2024-05-27 NOTE — ED Provider Notes (Signed)
 " [facility]MENT AT [facility] Provider Note   CSN: [id number] Arrival date & time: 05/27/24  1418     Patient presents with: Fatigue   [name] is a 54 y.o. female with past med history of HFpEF, hypertension, chronic anemia, venous insufficiency, hyperlipidemia, iron deficiency anemia due to chronic blood loss, who presents emergency department for evaluation of fatigue and chills.  Patient reports she was supposed to have her iron transfusion on 1/26 but it was canceled due to the weather.  She states that she was able to reschedule for February 17, but I cannot wait that long.  Patient also reports that she has had some abdominal pain over the last 1 to 2 days as well as occasional hematuria.  Patient continually insists that she needs to be admitted, as she cannot go on like this.  She denies any nausea or vomiting.   HPI     Prior to Admission medications  Medication Sig Start Date End Date Taking? Authorizing Provider  acetaminophen -codeine  (TYLENOL  #3) 300-30 MG tablet Take 1 tablet by mouth every 8 (eight) hours as needed for moderate pain (pain score 4-6). 04/13/24   Louder Barnie NOVAK, MD  amLODipine  (NORVASC ) 2.5 MG tablet Take 1 tablet (2.5 mg total) by mouth daily. 05/20/24   Louder Barnie NOVAK, MD  apixaban  (ELIQUIS ) 5 MG TABS tablet Take 1 tablet (5 mg total) by mouth 2 (two) times daily. 12/08/23   Louder Barnie NOVAK, MD  atorvastatin  (LIPITOR) 40 MG tablet TAKE 1 TABLET BY MOUTH DAILY 03/12/24   Louder Barnie NOVAK, MD  cefadroxil  (DURICEF) 500 MG capsule Take 1 capsule (500 mg total) by mouth 2 (two) times daily. 04/13/24   Louder Barnie NOVAK, MD  cholecalciferol  (VITAMIN D3) 25 MCG (1000 UNIT) tablet Take 1 tablet (1,000 Units total) by mouth daily. 08/07/23   Louder Barnie NOVAK, MD  docusate sodium  (COLACE) 100 MG capsule Take 1 capsule (100 mg total) by mouth 2 (two) times daily as needed for mild constipation. Patient taking  differently: Take 100 mg by mouth daily. 08/07/23   Louder Barnie NOVAK, MD  FEROSUL 325 (65 Fe) MG tablet TAKE ONE TABLET BY MOUTH DAILY WITH BREAKFAST 06/06/21   Louder Barnie NOVAK, MD  gabapentin  (NEURONTIN ) 400 MG capsule Take 1 capsule (400 mg total) by mouth 3 (three) times daily. 03/09/24   Barbarann Nest, MD  melatonin 3 MG TABS tablet Take 3 mg by mouth at bedtime. 01/14/24   [provider]  methocarbamol  (ROBAXIN ) 500 MG tablet TAKE 1 TABLET BY MOUTH 2 TIMES A DAY AS NEEDED FOR MUSCLE SPASMS 03/12/24   Louder Barnie NOVAK, MD  mometasone -formoterol  (DULERA ) 200-5 MCG/ACT AERO INHALE 2 PUFFS EVERY MORNING AND 2 PUFFS EVERY NIGHT AT BEDTIME 08/07/23   Louder Barnie NOVAK, MD  nitroGLYCERIN  (NITROSTAT ) 0.4 MG SL tablet DISSOLVE 1 TABLET UNDER THE TONGUE AS NEEDED FOR CHEST PAIN EVERY 5 MINUTES UP TO 3 TIMES. IF NO RELIEF CALL 911. Patient taking differently: Place 0.4 mg under the tongue every 5 (five) minutes as needed for chest pain. 03/17/23   Louder Barnie NOVAK, MD  PARoxetine  (PAXIL ) 30 MG tablet Take 1 tablet (30 mg total) by mouth daily. 02/10/24   Louder Barnie NOVAK, MD  Potassium Chloride  ER 20 MEQ TBCR Take 1 tablet (20 mEq total) by mouth daily as needed (cramping). Patient not taking: Reported on 03/10/2024 03/10/24   Louder Barnie NOVAK, MD  Semaglutide , 2 MG/DOSE, (OZEMPIC , 2 MG/DOSE,) 8 MG/3ML  SOPN DIAL AND INJECT 2 MG UNDER THE SKIN ONCE WEEKLY 04/09/24   [name], MD  spironolactone  (ALDACTONE ) 25 MG tablet Take 1 tablet (25 mg total) by mouth daily. 03/06/23 03/24/24  [name], MD  torsemide  (DEMADEX ) 20 MG tablet TAKE 2 TABLETS BY MOUTH DAILY 04/11/24   [name], MD  triamcinolone cream (KENALOG) 0.1 % Apply 1 Application topically daily as needed (skin irritation). 02/08/23   [provider]  vitamin B-12 (CYANOCOBALAMIN ) 1000 MCG tablet Take 1,000 mcg by mouth daily.    [provider]    Allergies: Ace inhibitors,  Hydromorphone, Vancomycin, Contrast media [iodinated contrast media], Dilaudid [hydromorphone hcl], Farxiga  [dapagliflozin ], and Lidocaine     Review of Systems  Updated Vital Signs BP (!) 134/111   Pulse 94   Temp 98.1 F (36.7 C) (Oral)   Resp 18   SpO2 99%   Physical Exam Vitals and nursing note reviewed.  Constitutional:      Appearance: Normal appearance.  HENT:     Head: Normocephalic and atraumatic.     Mouth/Throat:     Mouth: Mucous membranes are moist.  Eyes:     General: No scleral icterus.       Right eye: No discharge.        Left eye: No discharge.     Conjunctiva/sclera: Conjunctivae normal.  Cardiovascular:     Rate and Rhythm: Normal rate and regular rhythm.     Pulses: Normal pulses.  Pulmonary:     Effort: Pulmonary effort is normal.     Breath sounds: Normal breath sounds.  Abdominal:     General: There is no distension.     Palpations: Abdomen is soft.     Tenderness: There is abdominal tenderness.     Comments: Generalized tenderness to palpation  Musculoskeletal:        General: No deformity.     Cervical back: Normal range of motion.  Skin:    General: Skin is warm and dry.     Capillary Refill: Capillary refill takes less than 2 seconds.  Neurological:     Mental Status: She is alert.     Motor: No weakness.  Psychiatric:        Mood and Affect: Mood normal.     (all labs ordered are listed, but only abnormal results are displayed) Labs Reviewed  CBC WITH DIFFERENTIAL/PLATELET - Abnormal; Notable for the following components:      Result Value   RBC 3.70 (*)    Hemoglobin 9.8 (*)    HCT 33.2 (*)    MCHC 29.5 (*)    Platelets 122 (*)    All other components within normal limits  HEPATIC FUNCTION PANEL - Abnormal; Notable for the following components:   Bilirubin, Direct 0.3 (*)    All other components within normal limits  BASIC METABOLIC PANEL WITH GFR  LIPASE, BLOOD  URINALYSIS, ROUTINE W REFLEX MICROSCOPIC     EKG: None  Radiology: CT ABDOMEN PELVIS WO CONTRAST Result Date: 05/27/2024 EXAM: CT ABDOMEN AND PELVIS WITHOUT CONTRAST 05/27/2024 07:10:04 PM TECHNIQUE: CT of the abdomen and pelvis was performed without the administration of intravenous contrast. Multiplanar reformatted images are provided for review. Automated exposure control, iterative reconstruction, and/or weight-based adjustment of the mA/kV was utilized to reduce the radiation dose to as low as reasonably achievable. COMPARISON: None available. CLINICAL HISTORY: abdominal pain Abdominal pain. FINDINGS: LOWER CHEST: Cardiac findings suggestive of anemia. LIVER: The liver is unremarkable. GALLBLADDER AND  BILE DUCTS: Gallbladder is unremarkable. No biliary ductal dilatation. SPLEEN: No acute abnormality. PANCREAS: No acute abnormality. ADRENAL GLANDS: No acute abnormality. KIDNEYS, URETERS AND BLADDER: No stones in the kidneys or ureters. No hydronephrosis. No perinephric or periureteral stranding. Urinary bladder is unremarkable. GI AND BOWEL: No small or large bowel thickening or dilatation. Bowel resection is noted. The appendix is unremarkable. PERITONEUM AND RETROPERITONEUM: No ascites. No free air. VASCULATURE: Inferior vena cava filter. Aorta is normal in caliber. LYMPH NODES: Stable Enlarged right [place] lymph node measuring up to 2.6 cm. Normal fatty hilar morphology is noted. Similar finding on the left measuring up to 1.6 cm - stable (6:80). Stable Borderline enlarged 1.1 cm right retroperitoneal lymph node at the level of the IVC filter (6:54). REPRODUCTIVE ORGANS: The uterus is unremarkable. BONES AND SOFT TISSUES: Extensive venous collaterals along the soft tissues. Simple-appearing lipomatous lesion along the right adductor musculature. Stable Serpiginous tubular density along the left pelvic sidewall represents a vein (6:79). No acute osseous abnormality. No focal soft tissue abnormality. IMPRESSION: 1. No acute findings in the  abdomen or pelvis. 2. Stable enlarged bilateral [place] lymph nodes and caval lymph node. 3. Extensive venous collaterals in the abdominal and pelvic soft tissues with inferior vena cava filter in place. 4. Cardiac findings suggestive of anemia. Electronically signed by: Morgane Naveau MD 05/27/2024 07:36 PM EST RP Workstation: [id number]     Procedures   Medications Ordered in the ED  morphine  (PF) 4 MG/ML injection 4 mg (4 mg Intramuscular Given 05/27/24 1807)  ondansetron  (ZOFRAN -ODT) disintegrating tablet 4 mg (4 mg Oral Given 05/27/24 1807)  oxyCODONE -acetaminophen  (PERCOCET/ROXICET) 5-325 MG per tablet 1 tablet (1 tablet Oral Given 05/27/24 2111)                                 Medical Decision Making Amount and/or Complexity of Data Reviewed Labs: ordered. Radiology: ordered.  Risk Prescription drug management.   This patient presents to the ED for concern of fatigue and abdominal pain, this involves an extensive number of treatment options, and is a complaint that carries with it a high risk of complications and morbidity.  Differential diagnosis includes: URI, symptomatic anemia, cholecystitis, pancreatitis, diverticulitis, UTI, pyelonephritis, sepsis  Co morbidities:  see above   Additional history:  Patient was supposed to receive an iron transfusion on 1/26 but it was rescheduled to whether  Lab Tests:  I Ordered, and personally interpreted labs.  The pertinent results include:   - Hemoglobin: 9.8, which is around patient's baseline  Imaging Studies:  I ordered imaging studies including CT abdomen and pelvis I independently visualized and interpreted imaging which showed no acute findings I agree with the radiologist interpretation  Cardiac Monitoring/ECG:  The patient was maintained on a cardiac monitor.  I personally viewed and interpreted the cardiac monitored which showed an underlying rhythm of: Sinus rhythm  Medicines ordered and prescription drug  management:  I ordered medication including  Medications  morphine  (PF) 4 MG/ML injection 4 mg (4 mg Intramuscular Given 05/27/24 1807)  ondansetron  (ZOFRAN -ODT) disintegrating tablet 4 mg (4 mg Oral Given 05/27/24 1807)  oxyCODONE -acetaminophen  (PERCOCET/ROXICET) 5-325 MG per tablet 1 tablet (1 tablet Oral Given 05/27/24 2111)   for pain control Reevaluation of the patient after these medicines showed that the patient improved I have reviewed the patients home medicines and have made adjustments as needed  Test Considered:   none  Critical Interventions:  none  Consultations Obtained: None  Problem List / ED Course:     ICD-10-CM   1. Fatigue, unspecified type  R53.83     2. Iron deficiency anemia, unspecified iron deficiency anemia type  D50.9       MDM: 54 year old female presents emergency department for evaluation of fatigue after her iron transfusion was canceled on 1/26 and rescheduled for 2/17.  Patient does not report any acute change in her symptoms, but rather that she feels like she is unable to wait until mid February for her transfusion.  Her hemoglobin today is 9.8, which is consistent with her baseline.  No urgent indication for hemoglobin transfusion.  Additionally, iron transfusions are not performed in the emergency department.  I did explain this to the patient, and she continues to insist on being admitted.  I explained this is not emergent, and that there is not a medical reason for her to be admitted to the hospital.  I will plan to discharge her with a prescription for 50,000 units of oral iron, for her to take once a week until she is able to obtain her next iron transfusion.  In addition, she is also complaining of generalized abdominal tenderness, so I ordered a CT scan.  CT abdomen pelvis shows no acute findings.  There is not currently an emergent indication to bring the patient into the hospital.  I did treat her abdominal pain while she was in the ER.  I  also recommended that she take oral iron supplementation, such as Slow Fe until she is able to be seen for her iron transfusion.  Patient verbalized her understanding to this.  Her vital signs are stable.  Patient is appropriate for discharge at this time..  Dispostion:  After consideration of the diagnostic results and the patients response to treatment, I feel that the patient would benefit from supportive care.   Final diagnoses:  Fatigue, unspecified type  Iron deficiency anemia, unspecified iron deficiency anemia type    ED Discharge Orders     None          Torrence Marry GORMAN DEVONNA 05/27/24 2114    Doretha Folks, MD 05/27/24 2341  "

## 2024-05-27 NOTE — Telephone Encounter (Signed)
 FYI Only or Action Required?: FYI only for provider: ED advised.  Patient was last seen in primary care on 04/13/2024 by [name], MD.  Called Nurse Triage reporting Fatigue.  Symptoms began several days ago.  Interventions attempted: Rest, hydration, or home remedies.  Symptoms are: gradually worsening.  Triage Disposition: Go to ED Now (or PCP Triage)  Patient/caregiver understands and will follow disposition?: Yes       Reason for Disposition  Patient sounds very sick or weak to the triager  Answer Assessment - Initial Assessment Questions This RN recommended pt be examined in hospital asap, pt agreed. Pt verbalized understanding, will head to ER via loved one. Advised call 911 if any new or worsening symptoms.     1. DESCRIPTION: Describe how you are feeling.     First of all, was scheduled to get iron infusion on 1/26 but canceled for weather, so gave me for 2/17, don't think can wait, feel real fatigued real tired, don't want to do anything Went to go see gyn last week and ran blood and 9.1 hemoglobin Think need to be seen or get some blood in ER or whatever, was trying to wait til next month, but don't think can wait for that long Dizzy always tired Not felt this way in long time  2. SEVERITY: How bad is it?  Can you stand and walk?     Always laying down, not really doing anything Can walk but feel like been real dizzy and always sleeping, still getting up to go to bathroom  4. CAUSE: What do you think is causing the weakness or fatigue? (e.g., not drinking enough fluids, medical problem, trouble sleeping)     Not really been eating much, didn't take blood sugar yet Missed iron infusion scheduled for Monday since canceled for weather  6. OTHER SYMPTOMS: Do you have any other symptoms? (e.g., chest pain, fever, cough, SOB, vomiting, diarrhea, bleeding, other areas of pain)     Not really been eating much Hemorrhoids on rectum, bled just  little bit but nothing heavy Blood in urine/stool last week and Monday  Denies: SOB worse than usual Changes in speech Chest pain Weakness to one side Vomiting Diarrhea Bleeding, blood in urine/stool currently  Protocols used: Weakness (Generalized) and Fatigue-A-AH

## 2024-05-27 NOTE — ED Notes (Signed)
 Called lab to add lipase and hepatic function, spoke with [place].

## 2024-05-27 NOTE — ED Triage Notes (Signed)
 Feeling fatigue  Scheduled for blood transfusion on 1/26 canceled due to weather.  I'm just not feeling well No new symptoms

## 2024-05-27 NOTE — Discharge Instructions (Addendum)
 It was a pleasure taking care of you today. You were seen in the Emergency Department for evaluation of fatigue and chills. Your work-up was reassuring. Your CT/Xray/Labs showed no acute abnormality that would explain your symptoms.  I do suspect your symptoms are likely secondary to your chronic anemia.  As we discussed, this is not an indication to bring you into the hospital.  I do recommend trying oral iron supplementation, which you can acquire from the pharmacy, such as Slow Fe.  This may help, until you are able to be seen for your iron transfusion scheduled in a couple of weeks. Refer to the attached documentation for further management of your symptoms. Follow up with your PCP if your symptoms continue.  Please return to the ER if you experience chest pain, trouble breathing, intractable nausea/vomiting or any other life threatening illnesses.

## 2024-05-27 NOTE — Telephone Encounter (Signed)
 FYI  Patient currently being seen in ED

## 2024-06-02 ENCOUNTER — Encounter (HOSPITAL_BASED_OUTPATIENT_CLINIC_OR_DEPARTMENT_OTHER): Admitting: General Surgery

## 2024-06-03 ENCOUNTER — Ambulatory Visit (HOSPITAL_COMMUNITY): Admitting: Clinical

## 2024-06-07 ENCOUNTER — Ambulatory Visit: Payer: Self-pay | Admitting: Internal Medicine

## 2024-06-09 ENCOUNTER — Telehealth: Admitting: *Deleted

## 2024-06-10 ENCOUNTER — Encounter (HOSPITAL_BASED_OUTPATIENT_CLINIC_OR_DEPARTMENT_OTHER): Admitting: General Surgery

## 2024-06-15 ENCOUNTER — Encounter (HOSPITAL_COMMUNITY)

## 2024-06-16 ENCOUNTER — Encounter (HOSPITAL_BASED_OUTPATIENT_CLINIC_OR_DEPARTMENT_OTHER): Admitting: General Surgery

## 2024-06-23 ENCOUNTER — Ambulatory Visit (HOSPITAL_COMMUNITY): Admitting: Clinical

## 2024-07-14 ENCOUNTER — Ambulatory Visit (HOSPITAL_COMMUNITY): Admitting: Clinical

## 2024-08-20 ENCOUNTER — Ambulatory Visit (HOSPITAL_COMMUNITY)

## 2024-08-20 ENCOUNTER — Encounter: Admitting: Vascular Surgery
